# Patient Record
Sex: Female | Born: 1955 | Race: White | Hispanic: No | Marital: Married | Smoking: Never smoker
Health system: Southern US, Community
[De-identification: ages and names within clinical notes are randomized; demographics above are authoritative.]

## PROBLEM LIST (undated history)

## (undated) DIAGNOSIS — R600 Localized edema: Secondary | ICD-10-CM

## (undated) DIAGNOSIS — Q752 Hypertelorism: Secondary | ICD-10-CM

## (undated) DIAGNOSIS — G4730 Sleep apnea, unspecified: Secondary | ICD-10-CM

## (undated) DIAGNOSIS — I10 Essential (primary) hypertension: Secondary | ICD-10-CM

## (undated) DIAGNOSIS — Q2381 Bicuspid aortic valve: Secondary | ICD-10-CM

## (undated) DIAGNOSIS — I350 Nonrheumatic aortic (valve) stenosis: Secondary | ICD-10-CM

## (undated) DIAGNOSIS — I509 Heart failure, unspecified: Secondary | ICD-10-CM

## (undated) DIAGNOSIS — Z954 Presence of other heart-valve replacement: Secondary | ICD-10-CM

## (undated) DIAGNOSIS — Q231 Congenital insufficiency of aortic valve: Secondary | ICD-10-CM

## (undated) HISTORY — DX: Nonrheumatic aortic (valve) stenosis: I35.0

## (undated) HISTORY — DX: Congenital insufficiency of aortic valve: Q23.1

## (undated) HISTORY — DX: Essential (primary) hypertension: I10

## (undated) HISTORY — DX: Morbid (severe) obesity due to excess calories: E66.01

## (undated) HISTORY — DX: Heart failure, unspecified: I50.9

## (undated) HISTORY — DX: Bicuspid aortic valve: Q23.81

## (undated) HISTORY — DX: Presence of other heart-valve replacement: Z95.4

## (undated) HISTORY — DX: Localized edema: R60.0

## (undated) HISTORY — DX: Hypertelorism: Q75.2

## (undated) HISTORY — PX: TISSUE AORTIC VALVE REPLACEMENT: SHX2527

---

## 1981-09-09 HISTORY — PX: CHOLECYSTECTOMY: SHX55

## 1999-02-27 ENCOUNTER — Ambulatory Visit (HOSPITAL_COMMUNITY): Admission: RE | Admit: 1999-02-27 | Discharge: 1999-02-27 | Payer: Self-pay | Admitting: Family Medicine

## 2000-07-10 ENCOUNTER — Encounter: Payer: Self-pay | Admitting: Family Medicine

## 2000-07-10 ENCOUNTER — Encounter: Admission: RE | Admit: 2000-07-10 | Discharge: 2000-07-10 | Payer: Self-pay | Admitting: Family Medicine

## 2006-01-08 ENCOUNTER — Encounter: Payer: Self-pay | Admitting: Family Medicine

## 2013-03-23 DIAGNOSIS — I350 Nonrheumatic aortic (valve) stenosis: Secondary | ICD-10-CM

## 2013-03-23 DIAGNOSIS — I10 Essential (primary) hypertension: Secondary | ICD-10-CM | POA: Insufficient documentation

## 2013-03-23 DIAGNOSIS — I110 Hypertensive heart disease with heart failure: Secondary | ICD-10-CM | POA: Insufficient documentation

## 2013-03-23 HISTORY — DX: Hypertensive heart disease with heart failure: I11.0

## 2013-03-23 HISTORY — DX: Nonrheumatic aortic (valve) stenosis: I35.0

## 2013-03-23 HISTORY — DX: Essential (primary) hypertension: I10

## 2017-02-20 DIAGNOSIS — Q752 Hypertelorism: Secondary | ICD-10-CM | POA: Insufficient documentation

## 2017-02-20 DIAGNOSIS — R600 Localized edema: Secondary | ICD-10-CM

## 2017-02-20 DIAGNOSIS — Z954 Presence of other heart-valve replacement: Secondary | ICD-10-CM

## 2017-02-20 HISTORY — DX: Morbid (severe) obesity due to excess calories: E66.01

## 2017-02-20 HISTORY — DX: Presence of other heart-valve replacement: Z95.4

## 2017-02-20 HISTORY — DX: Hypertelorism: Q75.2

## 2017-02-20 HISTORY — DX: Localized edema: R60.0

## 2017-10-07 ENCOUNTER — Encounter: Payer: Self-pay | Admitting: Cardiology

## 2017-10-07 NOTE — Progress Notes (Signed)
Cardiology Office Note:    Date:  10/08/2017   ID:  [name], DOB August 05, 1956, MRN [id number]  PCP:  [name], MD  Cardiologist:  Shirlee More, MD   Referring MD: No ref. provider found  ASSESSMENT:    1. H/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   2. Hypertensive heart disease with heart failure ([place])    PLAN:    In order of problems listed above:  1. Clinically she is done well since surgery physical exam is not disturbing and I will review the echocardiogram when I can access the report.  If normal valve function we can delay follow-up echocardiogram for several years any questions require yearly surveillance. 2. Her blood pressure is above target she has marked edema right light likely related to calcium channel blocker.  I will switch metoprolol to carvedilol to take advantage of potent antihypertensive effect stop her calcium channel blocker and place on a low-dose of a loop diuretic with follow-up BMP and BNP in 1 week.  I am not convinced that she has heart failure and I suspect much of her edema is due to calcium channel blocker.  Next appointment in 3 weeks to assess response of blood pressure peripheral edema and review echocardiogram with patient   Medication Adjustments/Labs and Tests Ordered: Current medicines are reviewed at length with the patient today.  Concerns regarding medicines are outlined above.  Orders Placed This Encounter  Procedures  . Basic metabolic panel  . Pro b natriuretic peptide (BNP)   Meds ordered this encounter  Medications  . carvedilol (COREG) 6.25 MG tablet    Sig: Take 1 tablet (6.25 mg total) by mouth 2 (two) times daily.    Dispense:  180 tablet    Refill:  3  . furosemide (LASIX) 20 MG tablet    Sig: Take 1 tablet (20 mg total) by mouth daily.    Dispense:  90 tablet    Refill:  3     Chief Complaint  Patient presents with  . New Patient (Initial Visit)    to re-establish cardiac care    History of  Present Illness:    [name] is a 62 y.o. female who is being seen today for the evaluation of valvular heart disease at the request of No ref. provider found. She developed severe symptomatic aortic stenosis with heart failure and underwent 23 mm H[place] II on 03/30/13 Dr Clementeen Graham at [facility].  Her other medical problems included heart failure prior to valve replacement with a  bicuspid aortic valve.  She was seen by [facility] June had an echocardiogram performed results are unavailable  She relates that recently her PCP was concerned about murmur on exam and referred her back to the cardiac group and [place].  An echocardiogram was done and she was told it was fine.  She has done well overall but continues to be short of breath when she climbs stairs walks up hills has peripheral edema taking a calcium channel blocker and no longer takes a diuretic but no orthopnea PND chest pain palpitations syncope fever or chills.  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 Aortic stenosis 03/23/2013   Overview:  02/18/13 TTE EF >55%. Critical AS with mean Ao valve gradient of 82 mm Hg. No AI. No MR, PR, mild TR. Estimated RVSP 30 mm Hg.  . Bicuspid aortic valve   . Edema of both legs  02/20/2017  . 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 Heart failure ([place])   . HTN (hypertension) 03/23/2013  . 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 Morbid obesity ([name]) 02/20/2017    Past Surgical History:  Procedure Laterality Date  . TISSUE AORTIC VALVE REPLACEMENT      Current Medications: Current Meds  Medication Sig  . aspirin EC 81 MG tablet Take 81 mg by mouth daily.  Marland Kitchen buPROPion (WELLBUTRIN XL) 300 MG 24 hr tablet Take 300 mg by mouth daily.  Marland Kitchen desvenlafaxine (PRISTIQ) 100 MG 24 hr tablet Take 100 mg by mouth daily.  Marland Kitchen losartan (COZAAR) 100 MG tablet Take 1 tablet by mouth daily.  . pravastatin (PRAVACHOL) 20 MG tablet Take 20 mg by mouth  daily.  . [DISCONTINUED] amLODipine (NORVASC) 5 MG tablet TAKE 1 TABLET BY MOUTH ONCE DAILY  . [DISCONTINUED] metoprolol succinate (TOPROL-XL) 50 MG 24 hr tablet Take 50 mg by mouth daily.     Allergies:   Patient has no known allergies.   Social History   Socioeconomic History  . Marital status: Married    Spouse name: None  . Number of children: None  . Years of education: None  . Highest education level: None  Social Needs  . Financial resource strain: None  . Food insecurity - worry: None  . Food insecurity - inability: None  . Transportation needs - medical: None  . Transportation needs - non-medical: None  Occupational History  . None  Tobacco Use  . Smoking status: Never Smoker  . Smokeless tobacco: Never Used  Substance and Sexual Activity  . Alcohol use: No    Frequency: Never  . Drug use: No  . Sexual activity: None  Other Topics Concern  . None  Social History Narrative  . None     Family History: The patient's family history includes Parkinson's disease in her father.  ROS:   ROS Please see the history of present illness.     All other systems reviewed and are negative.  EKGs/Labs/Other Studies Reviewed:    The following studies were reviewed today:   EKG:  EKG is  ordered today.  The ekg ordered today demonstrates sinus rhythm LVH repolarization  Recent Labs: No results found for requested labs within last 8760 hours.  Recent Lipid Panel No results found for: CHOL, TRIG, HDL, CHOLHDL, VLDL, LDLCALC, LDLDIRECT  Physical Exam:    VS:  BP (!) 142/86 (BP Location: Right Arm, Patient Position: Sitting, Cuff Size: Large)   Pulse 76   Ht 5\' 4"  (1.626 m)   Wt 274 lb 6.4 oz (124.5 kg)   SpO2 97%   BMI 47.10 kg/m     Wt Readings from Last 3 Encounters:  10/08/17 274 lb 6.4 oz (124.5 kg)     GEN: Marked obesity well  in no acute distress HEENT: Normal NECK: No JVD; No carotid bruits LYMPHATICS: No lymphadenopathy CARDIAC: Expected 1 of 6  localized ejection murmur harsh peaks early S2 normal no AR RRR,  RESPIRATORY:  Clear to auscultation without rales, wheezing or rhonchi  ABDOMEN: Soft, non-tender, non-distended MUSCULOSKELETAL: 4+ bilateral to the knee edema; No deformity  SKIN: Warm and dry NEUROLOGIC:  Alert and oriented x 3 PSYCHIATRIC:  Normal affect     Signed, Shirlee More, MD  10/08/2017 12:27 PM    [facility]

## 2017-10-08 ENCOUNTER — Encounter: Payer: Self-pay | Admitting: Cardiology

## 2017-10-08 ENCOUNTER — Ambulatory Visit: Payer: BC Managed Care – PPO | Admitting: Cardiology

## 2017-10-08 VITALS — BP 142/86 | HR 76 | Ht 64.0 in | Wt 274.4 lb

## 2017-10-08 DIAGNOSIS — I110 Hypertensive heart disease with heart failure: Secondary | ICD-10-CM

## 2017-10-08 DIAGNOSIS — Z954 Presence of other heart-valve replacement: Secondary | ICD-10-CM

## 2017-10-08 MED ORDER — CARVEDILOL 6.25 MG PO TABS: 6.2500 mg | ORAL_TABLET | Freq: Two times a day (BID) | ORAL | 3 refills | Status: DC

## 2017-10-08 MED ORDER — FUROSEMIDE 20 MG PO TABS: 20.0000 mg | ORAL_TABLET | Freq: Every day | ORAL | 3 refills | Status: DC

## 2017-10-08 NOTE — Addendum Note (Signed)
Addended by: Warner Mccreedy E on: 10/08/2017 02:06 PM   Modules accepted: Orders

## 2017-10-08 NOTE — Patient Instructions (Addendum)
Medication Instructions:  Your physician has recommended you make the following change in your medication:  START carevedilol 6.25 mg twice daily START furosemide 20 gm daily  Labwork: Your physician recommends that you have the following labs drawn: BMP and BNP at [facility] in 1 week  Testing/Procedures: None  Follow-Up: Your physician recommends that you schedule a follow-up appointment in: 3 weeks  Any Other Special Instructions Will Be Listed Below (If Applicable).     If you need a refill on your cardiac medications before your next appointment, please call your pharmacy.   [place], RN, BSN   Furosemide tablets What is this medicine? FUROSEMIDE (fyoor [place] se mide) is a diuretic. It helps you make more urine and to lose salt and excess water from your body. This medicine is used to treat high blood pressure, and edema or swelling from heart, kidney, or liver disease. This medicine may be used for other purposes; ask your health care provider or pharmacist if you have questions. COMMON BRAND NAME(S): Active-Medicated Specimen Kit, Delone, Diuscreen, Lasix, RX Specimen Collection Kit, Specimen Collection Kit, URINX Medicated Specimen Collection What should I tell my health care provider before I take this medicine? They need to know if you have any of these conditions: -abnormal blood electrolytes -diarrhea or vomiting -gout -heart disease -kidney disease, small amounts of urine, or difficulty passing urine -liver disease -thyroid disease -an unusual or allergic reaction to furosemide, sulfa drugs, other medicines, foods, dyes, or preservatives -pregnant or trying to get pregnant -breast-feeding How should I use this medicine? Take this medicine by mouth with a glass of water. Follow the directions on the prescription label. You may take this medicine with or without food. If it upsets your stomach, take it with food or milk. Do not take your medicine  more often than directed. Remember that you will need to pass more urine after taking this medicine. Do not take your medicine at a time of day that will cause you problems. Do not take at bedtime. Talk to your pediatrician regarding the use of this medicine in children. While this drug may be prescribed for selected conditions, precautions do apply. Overdosage: If you think you have taken too much of this medicine contact a poison control center or emergency room at once. NOTE: This medicine is only for you. Do not share this medicine with others. What if I miss a dose? If you miss a dose, take it as soon as you can. If it is almost time for your next dose, take only that dose. Do not take double or extra doses. What may interact with this medicine? -aspirin and aspirin-like medicines -certain antibiotics -chloral hydrate -cisplatin -cyclosporine -digoxin -diuretics -laxatives -lithium -medicines for blood pressure -medicines that relax muscles for surgery -methotrexate -NSAIDs, medicines for pain and inflammation like ibuprofen, naproxen, or indomethacin -phenytoin -steroid medicines like prednisone or cortisone -sucralfate -thyroid hormones This list may not describe all possible interactions. Give your health care provider a list of all the medicines, herbs, non-prescription drugs, or dietary supplements you use. Also tell them if you smoke, drink alcohol, or use illegal drugs. Some items may interact with your medicine. What should I watch for while using this medicine? Visit your doctor or health care professional for regular checks on your progress. Check your blood pressure regularly. Ask your doctor or health care professional what your blood pressure should be, and when you should contact him or her. If you are a diabetic,  check your blood sugar as directed. You may need to be on a special diet while taking this medicine. Check with your doctor. Also, ask how many glasses of  fluid you need to drink a day. You must not get dehydrated. You may get drowsy or dizzy. Do not drive, use machinery, or do anything that needs mental alertness until you know how this drug affects you. Do not stand or sit up quickly, especially if you are an older patient. This reduces the risk of dizzy or fainting spells. Alcohol can make you more drowsy and dizzy. Avoid alcoholic drinks. This medicine can make you more sensitive to the sun. Keep out of the sun. If you cannot avoid being in the sun, wear protective clothing and use sunscreen. Do not use sun lamps or tanning beds/booths. What side effects may I notice from receiving this medicine? Side effects that you should report to your doctor or health care professional as soon as possible: -blood in urine or stools -dry mouth -fever or chills -hearing loss or ringing in the ears -irregular heartbeat -muscle pain or weakness, cramps -skin rash -stomach upset, pain, or nausea -tingling or numbness in the hands or feet -unusually weak or tired -vomiting or diarrhea -yellowing of the eyes or skin Side effects that usually do not require medical attention (report to your doctor or health care professional if they continue or are bothersome): -headache -loss of appetite -unusual bleeding or bruising This list may not describe all possible side effects. Call your doctor for medical advice about side effects. You may report side effects to FDA at 1-800-FDA-1088. Where should I keep my medicine? Keep out of the reach of children. Store at room temperature between 15 and 30 degrees C (59 and 86 degrees F). Protect from light. Throw away any unused medicine after the expiration date. NOTE: This sheet is a summary. It may not cover all possible information. If you have questions about this medicine, talk to your doctor, pharmacist, or health care provider.  2018 Elsevier/Gold Standard (2014-11-16 13:49:50) Carvedilol tablets What is this  medicine? CARVEDILOL (KAR ve dil ol) is a beta-blocker. Beta-blockers reduce the workload on the heart and help it to beat more regularly. This medicine is used to treat high blood pressure and heart failure. This medicine may be used for other purposes; ask your health care provider or pharmacist if you have questions. COMMON BRAND NAME(S): Coreg What should I tell my health care provider before I take this medicine? They need to know if you have any of these conditions: -circulation problems -diabetes -history of heart attack or heart disease -liver disease -lung or breathing disease, like asthma or emphysema -pheochromocytoma -slow or irregular heartbeat -thyroid disease -an unusual or allergic reaction to carvedilol, other beta-blockers, medicines, foods, dyes, or preservatives -pregnant or trying to get pregnant -breast-feeding How should I use this medicine? Take this medicine by mouth with a glass of water. Follow the directions on the prescription label. It is best to take the tablets with food. Take your doses at regular intervals. Do not take your medicine more often than directed. Do not stop taking except on the advice of your doctor or health care professional. Talk to your pediatrician regarding the use of this medicine in children. Special care may be needed. Overdosage: If you think you have taken too much of this medicine contact a poison control center or emergency room at once. NOTE: This medicine is only for you. Do not share this medicine  with others. What if I miss a dose? If you miss a dose, take it as soon as you can. If it is almost time for your next dose, take only that dose. Do not take double or extra doses. What may interact with this medicine? This medicine may interact with the following medications: -certain medicines for blood pressure, heart disease, irregular heart beat -certain medicines for depression, like fluoxetine or paroxetine -certain medicines  for diabetes, like glipizide or glyburide -cimetidine -clonidine -cyclosporine -digoxin -MAOIs like Carbex, Eldepryl, Marplan, Nardil, and Parnate -reserpine -rifampin This list may not describe all possible interactions. Give your health care provider a list of all the medicines, herbs, non-prescription drugs, or dietary supplements you use. Also tell them if you smoke, drink alcohol, or use illegal drugs. Some items may interact with your medicine. What should I watch for while using this medicine? Check your heart rate and blood pressure regularly while you are taking this medicine. Ask your doctor or health care professional what your heart rate and blood pressure should be, and when you should contact him or her. Do not stop taking this medicine suddenly. This could lead to serious heart-related effects. Contact your doctor or health care professional if you have difficulty breathing while taking this drug. Check your weight daily. Ask your doctor or health care professional when you should notify him/her of any weight gain. You may get drowsy or dizzy. Do not drive, use machinery, or do anything that requires mental alertness until you know how this medicine affects you. To reduce the risk of dizzy or fainting spells, do not sit or stand up quickly. Alcohol can make you more drowsy, and increase flushing and rapid heartbeats. Avoid alcoholic drinks. If you have diabetes, check your blood sugar as directed. Tell your doctor if you have changes in your blood sugar while you are taking this medicine. If you are going to have surgery, tell your doctor or health care professional that you are taking this medicine. What side effects may I notice from receiving this medicine? Side effects that you should report to your doctor or health care professional as soon as possible: -allergic reactions like skin rash, itching or hives, swelling of the face, lips, or tongue -breathing problems -dark  urine -irregular heartbeat -swollen legs or ankles -vomiting -yellowing of the eyes or skin Side effects that usually do not require medical attention (report to your doctor or health care professional if they continue or are bothersome): -change in sex drive or performance -diarrhea -dry eyes (especially if wearing contact lenses) -dry, itching skin -headache -nausea -unusually tired This list may not describe all possible side effects. Call your doctor for medical advice about side effects. You may report side effects to FDA at 1-800-FDA-1088. Where should I keep my medicine? Keep out of the reach of children. Store at room temperature below 30 degrees C (86 degrees F). Protect from moisture. Keep container tightly closed. Throw away any unused medicine after the expiration date. NOTE: This sheet is a summary. It may not cover all possible information. If you have questions about this medicine, talk to your doctor, pharmacist, or health care provider.  2018 Elsevier/Gold Standard (2013-05-02 14:12:02)   DASH diet: Healthy eating to lower your blood pressure The DASH diet emphasizes portion size, eating a variety of foods and getting the right amount of nutrients. Discover how DASH can improve your health and lower your blood pressure. By [facility] Staff  DASH stands for Dietary Approaches to  Stop Hypertension. The DASH diet is a lifelong approach to healthy eating that's designed to help treat or prevent high blood pressure (hypertension). The DASH diet encourages you to reduce the sodium in your diet and eat a variety of foods rich in nutrients that help lower blood pressure, such as potassium, calcium and magnesium. By following the DASH diet, you may be able to reduce your blood pressure by a few points in just two weeks. Over time, your systolic blood pressure could drop by eight to 14 points, which can make a significant difference in your health risks. Because the DASH diet is a  healthy way of eating, it offers health benefits besides just lowering blood pressure. The DASH diet is also in line with dietary recommendations to prevent osteoporosis, cancer, heart disease, stroke and diabetes. DASH diet: Sodium levels The DASH diet emphasizes vegetables, fruits and low-fat dairy foods - and moderate amounts of whole grains, fish, poultry and nuts. In addition to the standard DASH diet, there is also a lower sodium version of the diet. You can choose the version of the diet that meets your health needs: Standard DASH diet. You can consume up to 2,300 milligrams (mg) of sodium a day.  Lower sodium DASH diet. You can consume up to 1,500 mg of sodium a day. Both versions of the DASH diet aim to reduce the amount of sodium in your diet compared with what you might get in a typical American diet, which can amount to a whopping 3,400 mg of sodium a day or more. The standard DASH diet meets the recommendation from the Dietary Guidelines for Americans to keep daily sodium intake to less than 2,300 mg a day. The American Heart Association recommends 1,500 mg a day of sodium as an upper limit for all adults. If you aren't sure what sodium level is right for you, talk to your doctor. DASH diet: What to eat Both versions of the DASH diet include lots of whole grains, fruits, vegetables and low-fat dairy products. The DASH diet also includes some fish, poultry and legumes, and encourages a small amount of nuts and seeds a few times a week.  You can eat red meat, sweets and fats in small amounts. The DASH diet is low in saturated fat, cholesterol and total fat. Here's a look at the recommended servings from each food group for the 2,000-calorie-a-day DASH diet. Grains: 6 to 8 servings a day Grains include bread, cereal, rice and pasta. Examples of one serving of grains include 1 slice whole-wheat bread, 1 ounce dry cereal, or 1/2 cup cooked cereal, rice or pasta. Focus on whole grains because  they have more fiber and nutrients than do refined grains. For instance, use brown rice instead of white rice, whole-wheat pasta instead of regular pasta and whole-grain bread instead of white bread. Look for products labeled "100 percent whole grain" or "100 percent whole wheat."  Grains are naturally low in fat. Keep them this way by avoiding butter, cream and cheese sauces. Vegetables: 4 to 5 servings a day Tomatoes, carrots, broccoli, sweet potatoes, greens and other vegetables are full of fiber, vitamins, and such minerals as potassium and magnesium. Examples of one serving include 1 cup raw leafy green vegetables or 1/2 cup cut-up raw or cooked vegetables. Don't think of vegetables only as side dishes - a hearty blend of vegetables served over brown rice or whole-wheat noodles can serve as the main dish for a meal.  Fresh and frozen vegetables are both  good choices. When buying frozen and canned vegetables, choose those labeled as low sodium or without added salt.  To increase the number of servings you fit in daily, be creative. In a stir-fry, for instance, cut the amount of meat in half and double up on the vegetables. Fruits: 4 to 5 servings a day Many fruits need little preparation to become a healthy part of a meal or snack. Like vegetables, they're packed with fiber, potassium and magnesium and are typically low in fat - coconuts are an exception. Examples of one serving include one medium fruit, 1/2 cup fresh, frozen or canned fruit, or 4 ounces of juice. Have a piece of fruit with meals and one as a snack, then round out your day with a dessert of fresh fruits topped with a dollop of low-fat yogurt.  Leave on edible peels whenever possible. The peels of apples, pears and most fruits with pits add interesting texture to recipes and contain healthy nutrients and fiber.  Remember that citrus fruits and juices, such as grapefruit, can interact with certain medications, so check with your doctor  or pharmacist to see if they're OK for you.  If you choose canned fruit or juice, make sure no sugar is added. Dairy: 2 to 3 servings a day Milk, yogurt, cheese and other dairy products are major sources of calcium, vitamin D and protein. But the key is to make sure that you choose dairy products that are low fat or fat-free because otherwise they can be a major source of fat - and most of it is saturated. Examples of one serving include 1 cup skim or 1 percent milk, 1 cup low fat yogurt, or 1 1/2 ounces part-skim cheese. Low-fat or fat-free frozen yogurt can help you boost the amount of dairy products you eat while offering a sweet treat. Add fruit for a healthy twist.  If you have trouble digesting dairy products, choose lactose-free products or consider taking an over-the-counter product that contains the enzyme lactase, which can reduce or prevent the symptoms of lactose intolerance.  Go easy on regular and even fat-free cheeses because they are typically high in sodium. Lean meat, poultry and fish: 6 servings or fewer a day Meat can be a rich source of protein, B vitamins, iron and zinc. Choose lean varieties and aim for no more than 6 ounces a day. Cutting back on your meat portion will allow room for more vegetables. Trim away skin and fat from poultry and meat and then bake, broil, grill or roast instead of frying in fat.  Eat heart-healthy fish, such as salmon, herring and tuna. These types of fish are high in omega-3 fatty acids, which can help lower your total cholesterol. Nuts, seeds and legumes: 4 to 5 servings a week Almonds, sunflower seeds, kidney beans, peas, lentils and other foods in this family are good sources of magnesium, potassium and protein. They're also full of fiber and phytochemicals, which are plant compounds that may protect against some cancers and cardiovascular disease. Serving sizes are small and are intended to be consumed only a few times a week because these foods  are high in calories. Examples of one serving include 1/3 cup nuts, 2 tablespoons seeds, or 1/2 cup cooked beans or peas.  Nuts sometimes get a bad rap because of their fat content, but they contain healthy types of fat - monounsaturated fat and omega-3 fatty acids. They're high in calories, however, so eat them in moderation. Try adding them to stir-fries,  salads or cereals.  Soybean-based products, such as tofu and tempeh, can be a good alternative to meat because they contain all of the amino acids your body needs to make a complete protein, just like meat. Fats and oils: 2 to 3 servings a day Fat helps your body absorb essential vitamins and helps your body's immune system. But too much fat increases your risk of heart disease, diabetes and obesity. The DASH diet strives for a healthy balance by limiting total fat to less than 30 percent of daily calories from fat, with a focus on the healthier monounsaturated fats. Examples of one serving include 1 teaspoon soft margarine, 1 tablespoon mayonnaise or 2 tablespoons salad dressing. Saturated fat and trans fat are the main dietary culprits in increasing your risk of coronary artery disease. DASH helps keep your daily saturated fat to less than 6 percent of your total calories by limiting use of meat, butter, cheese, whole milk, cream and eggs in your diet, along with foods made from lard, solid shortenings, and palm and coconut oils.  Avoid trans fat, commonly found in such processed foods as crackers, baked goods and fried items.  Read food labels on margarine and salad dressing so that you can choose those that are lowest in saturated fat and free of trans fat. Sweets: 5 servings or fewer a week You don't have to banish sweets entirely while following the DASH diet - just go easy on them. Examples of one serving include 1 tablespoon sugar, jelly or jam, 1/2 cup sorbet, or 1 cup lemonade. When you eat sweets, choose those that are fat-free or low-fat,  such as sorbets, fruit ices, jelly beans, hard candy, graham crackers or low-fat cookies.  Artificial sweeteners such as aspartame (NutraSweet, Equal) and sucralose (Splenda) may help satisfy your sweet tooth while sparing the sugar. But remember that you still must use them sensibly. It's OK to swap a diet cola for a regular cola, but not in place of a more nutritious beverage such as low-fat milk or even plain water.  Cut back on added sugar, which has no nutritional value but can pack on calories. DASH diet: Alcohol and caffeine Drinking too much alcohol can increase blood pressure. The Dietary Guidelines for Americans recommends that men limit alcohol to no more than two drinks a day and women to one or less. The DASH diet doesn't address caffeine consumption. The influence of caffeine on blood pressure remains unclear. But caffeine can cause your blood pressure to rise at least temporarily. If you already have high blood pressure or if you think caffeine is affecting your blood pressure, talk to your doctor about your caffeine consumption. DASH diet and weight loss While the DASH diet is not a weight-loss program, you may indeed lose unwanted pounds because it can help guide you toward healthier food choices. The DASH diet generally includes about 2,000 calories a day. If you're trying to lose weight, you may need to eat fewer calories. You may also need to adjust your serving goals based on your individual circumstances - something your health care team can help you decide. Tips to cut back on sodium The foods at the core of the DASH diet are naturally low in sodium. So just by following the DASH diet, you're likely to reduce your sodium intake. You also reduce sodium further by: Using sodium-free spices or flavorings with your food instead of salt  Not adding salt when cooking rice, pasta or hot cereal  Rinsing canned foods  to remove some of the sodium  Buying foods labeled "no salt added,"  "sodium-free," "low sodium" or "very low sodium" One teaspoon of table salt has 2,325 mg of sodium. When you read food labels, you may be surprised at just how much sodium some processed foods contain. Even low-fat soups, canned vegetables, ready-to-eat cereals and sliced [place] from the local deli - foods you may have considered healthy - often have lots of sodium. You may notice a difference in taste when you choose low-sodium food and beverages. If things seem too bland, gradually introduce low-sodium foods and cut back on table salt until you reach your sodium goal. That'll give your palate time to adjust. Using salt-free seasoning blends or herbs and spices may also ease the transition. It can take several weeks for your taste buds to get used to less salty foods. Putting the pieces of the DASH diet together Try these strategies to get started on the DASH diet:  Change gradually. If you now eat only one or two servings of fruits or vegetables a day, try to add a serving at lunch and one at dinner. Rather than switching to all whole grains, start by making one or two of your grain servings whole grains. Increasing fruits, vegetables and whole grains gradually can also help prevent bloating or diarrhea that may occur if you aren't used to eating a diet with lots of fiber. You can also try over-the-counter products to help reduce gas from beans and vegetables.  Reward successes and forgive slip-ups. Reward yourself with a nonfood treat for your accomplishments - rent a movie, purchase a book or get together with a friend. Everyone slips, especially when learning something new. Remember that changing your lifestyle is a long-term process. Find out what triggered your setback and then just pick up where you left off with the DASH diet.  Add physical activity. To boost your blood pressure lowering efforts even more, consider increasing your physical activity in addition to following the DASH diet. Combining  both the DASH diet and physical activity makes it more likely that you'll reduce your blood pressure.  Get support if you need it. If you're having trouble sticking to your diet, talk to your doctor or dietitian about it. You might get some tips that will help you stick to the DASH diet. Remember, healthy eating isn't an all-or-nothing proposition. What's most important is that, on average, you eat healthier foods with plenty of variety - both to keep your diet nutritious and to avoid boredom or extremes. And with the DASH diet, you can have both.  Hypertension Hypertension, commonly called high blood pressure, is when the force of blood pumping through the arteries is too strong. The arteries are the blood vessels that carry blood from the heart throughout the body. Hypertension forces the heart to work harder to pump blood and may cause arteries to become narrow or stiff. Having untreated or uncontrolled hypertension can cause heart attacks, strokes, kidney disease, and other problems. A blood pressure reading consists of a higher number over a lower number. Ideally, your blood pressure should be below 120/80. The first ("top") number is called the systolic pressure. It is a measure of the pressure in your arteries as your heart beats. The second ("bottom") number is called the diastolic pressure. It is a measure of the pressure in your arteries as the heart relaxes. What are the causes? The cause of this condition is not known. What increases the risk? Some risk factors  for high blood pressure are under your control. Others are not. Factors you can change  Smoking.  Having type 2 diabetes mellitus, high cholesterol, or both.  Not getting enough exercise or physical activity.  Being overweight.  Having too much fat, sugar, calories, or salt (sodium) in your diet.  Drinking too much alcohol. Factors that are difficult or impossible to change  Having chronic kidney disease.  Having a  family history of high blood pressure.  Age. Risk increases with age.  Race. You may be at higher risk if you are African-American.  Gender. Men are at higher risk than women before age 75. After age 78, women are at higher risk than men.  Having obstructive sleep apnea.  Stress. What are the signs or symptoms? Extremely high blood pressure (hypertensive crisis) may cause:  Headache.  Anxiety.  Shortness of breath.  Nosebleed.  Nausea and vomiting.  Severe chest pain.  Jerky movements you cannot control (seizures).  How is this diagnosed? This condition is diagnosed by measuring your blood pressure while you are seated, with your arm resting on a surface. The cuff of the blood pressure monitor will be placed directly against the skin of your upper arm at the level of your heart. It should be measured at least twice using the same arm. Certain conditions can cause a difference in blood pressure between your right and left arms. Certain factors can cause blood pressure readings to be lower or higher than normal (elevated) for a short period of time:  When your blood pressure is higher when you are in a health care provider's office than when you are at home, this is called white coat hypertension. Most people with this condition do not need medicines.  When your blood pressure is higher at home than when you are in a health care provider's office, this is called masked hypertension. Most people with this condition may need medicines to control blood pressure.  If you have a high blood pressure reading during one visit or you have normal blood pressure with other risk factors:  You may be asked to return on a different day to have your blood pressure checked again.  You may be asked to monitor your blood pressure at home for 1 week or longer.  If you are diagnosed with hypertension, you may have other blood or imaging tests to help your health care provider understand your  overall risk for other conditions. How is this treated? This condition is treated by making healthy lifestyle changes, such as eating healthy foods, exercising more, and reducing your alcohol intake. Your health care provider may prescribe medicine if lifestyle changes are not enough to get your blood pressure under control, and if:  Your systolic blood pressure is above 130.  Your diastolic blood pressure is above 80.  Your personal target blood pressure may vary depending on your medical conditions, your age, and other factors. Follow these instructions at home: Eating and drinking  Eat a diet that is high in fiber and potassium, and low in sodium, added sugar, and fat. An example eating plan is called the DASH (Dietary Approaches to Stop Hypertension) diet. To eat this way: ? Eat plenty of fresh fruits and vegetables. Try to fill half of your plate at each meal with fruits and vegetables. ? Eat whole grains, such as whole wheat pasta, brown rice, or whole grain bread. Fill about one quarter of your plate with whole grains. ? Eat or drink low-fat dairy products,  such as skim milk or low-fat yogurt. ? Avoid fatty cuts of meat, processed or cured meats, and poultry with skin. Fill about one quarter of your plate with lean proteins, such as fish, chicken without skin, beans, eggs, and tofu. ? Avoid premade and processed foods. These tend to be higher in sodium, added sugar, and fat.  Reduce your daily sodium intake. Most people with hypertension should eat less than 1,500 mg of sodium a day.  Limit alcohol intake to no more than 1 drink a day for nonpregnant women and 2 drinks a day for men. One drink equals 12 oz of beer, 5 oz of wine, or 1 oz of hard liquor. Lifestyle  Work with your health care provider to maintain a healthy body weight or to lose weight. Ask what an ideal weight is for you.  Get at least 30 minutes of exercise that causes your heart to beat faster (aerobic exercise)  most days of the week. Activities may include walking, swimming, or biking.  Include exercise to strengthen your muscles (resistance exercise), such as pilates or lifting weights, as part of your weekly exercise routine. Try to do these types of exercises for 30 minutes at least 3 days a week.  Do not use any products that contain nicotine or tobacco, such as cigarettes and e-cigarettes. If you need help quitting, ask your health care provider.  Monitor your blood pressure at home as told by your health care provider.  Keep all follow-up visits as told by your health care provider. This is important. Medicines  Take over-the-counter and prescription medicines only as told by your health care provider. Follow directions carefully. Blood pressure medicines must be taken as prescribed.  Do not skip doses of blood pressure medicine. Doing this puts you at risk for problems and can make the medicine less effective.  Ask your health care provider about side effects or reactions to medicines that you should watch for. Contact a health care provider if:  You think you are having a reaction to a medicine you are taking.  You have headaches that keep coming back (recurring).  You feel dizzy.  You have swelling in your ankles.  You have trouble with your vision. Get help right away if:  You develop a severe headache or confusion.  You have unusual weakness or numbness.  You feel faint.  You have severe pain in your chest or abdomen.  You vomit repeatedly.  You have trouble breathing. Summary  Hypertension is when the force of blood pumping through your arteries is too strong. If this condition is not controlled, it may put you at risk for serious complications.  Your personal target blood pressure may vary depending on your medical conditions, your age, and other factors. For most people, a normal blood pressure is less than 120/80.  Hypertension is treated with lifestyle changes,  medicines, or a combination of both. Lifestyle changes include weight loss, eating a healthy, low-sodium diet, exercising more, and limiting alcohol. This information is not intended to replace advice given to you by your health care provider. Make sure you discuss any questions you have with your health care provider.   Document Released: 08/26/2005 Document Revised: 07/24/2016 Document Reviewed: 07/24/2016 Elsevier Interactive Patient Education  [employer].

## 2017-11-03 NOTE — Progress Notes (Signed)
Cardiology Office Note:    Date:  11/04/2017   ID:  [name], DOB 06/17/1956, MRN [id number]  PCP:  [name], MD  Cardiologist:  Shirlee More, MD    Referring MD: [name], MD    ASSESSMENT:    1. Hypertensive heart disease with heart failure ([place])   2. Edema of both legs   3. H/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    PLAN:    In order of problems listed above:  1. Stable continue loop diuretic.  I am awaiting labs that were drawn as an outpatient #from last few weeks requested from PCP.  Blood pressure remains elevated add in more potent ARB. 2. Improved continue loop diuretic 3. Stable awaiting echocardiogram from June which is in scanning.    Next appointment: 6 months   Medication Adjustments/Labs and Tests Ordered: Current medicines are reviewed at length with the patient today.  Concerns regarding medicines are outlined above.  No orders of the defined types were placed in this encounter.  Meds ordered this encounter  Medications  . telmisartan (MICARDIS) 40 MG tablet    Sig: Take 1 tablet (40 mg total) by mouth daily.    Dispense:  90 tablet    Refill:  3    Chief Complaint  Patient presents with  . Cardiac Valve Problem    History of AVR  . Hypertension    History of Present Illness:    [name] is a 62 y.o. female with a hx of severe symptomatic aortic stenosis with heart failure and underwent 23 mm Hancock II on 03/30/13 Dr Clementeen Graham at [facility].  Her other medical problems included heart failure prior to valve replacement with a  bicuspid aortic valve. She was  last seen one month ago.  ASSESSMENT:    1. H/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   2. Hypertensive heart disease with heart failure (HCC)    PLAN:    In order of problems listed above:           Clinically she is done well since surgery physical exam is not disturbing and I will review the echocardiogram when I can access the report.  If normal  valve function we can delay follow-up echocardiogram for several years any questions require yearly surveillance.            Her blood pressure is above target she has marked edema right light likely related to calcium channel blocker.  I will switch metoprolol to carvedilol to take advantage of potent antihypertensive effect stop her calcium channel blocker and place on a low-dose of a loop diuretic with follow-up BMP and BNP in 1 week.  I am not convinced that she has heart failure and I suspect much of her edema is due to calcium channel blocker.  Compliance with diet, lifestyle and medications: Yes She is improved less edema shortness of breath but blood pressure remains elevated. Past Medical History:  Diagnosis Date  . Aortic stenosis 03/23/2013   Overview:  02/18/13 TTE EF >55%. Critical AS with mean Ao valve gradient of 82 mm Hg. No AI. No MR, PR, mild TR. Estimated RVSP 30 mm Hg.  . Bicuspid aortic valve   . Edema of both legs 02/20/2017  . 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 Heart failure ([place])   . HTN (hypertension) 03/23/2013  . 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 Morbid obesity ([place]) 02/20/2017    Past Surgical  History:  Procedure Laterality Date  . TISSUE AORTIC VALVE REPLACEMENT      Current Medications: Current Meds  Medication Sig  . aspirin EC 81 MG tablet Take 81 mg by mouth daily.  Marland Kitchen buPROPion (WELLBUTRIN XL) 300 MG 24 hr tablet Take 300 mg by mouth daily.  . carvedilol (COREG) 6.25 MG tablet Take 1 tablet (6.25 mg total) by mouth 2 (two) times daily.  Marland Kitchen desvenlafaxine (PRISTIQ) 100 MG 24 hr tablet Take 100 mg by mouth daily.  . furosemide (LASIX) 20 MG tablet Take 1 tablet (20 mg total) by mouth daily.  . pravastatin (PRAVACHOL) 20 MG tablet Take 20 mg by mouth daily.  . [DISCONTINUED] losartan (COZAAR) 100 MG tablet Take 1 tablet by mouth daily.     Allergies:   Patient has no known allergies.   Social History   Socioeconomic History  . Marital  status: Married    Spouse name: None  . Number of children: None  . Years of education: None  . Highest education level: None  Social Needs  . Financial resource strain: None  . Food insecurity - worry: None  . Food insecurity - inability: None  . Transportation needs - medical: None  . Transportation needs - non-medical: None  Occupational History  . None  Tobacco Use  . Smoking status: Never Smoker  . Smokeless tobacco: Never Used  Substance and Sexual Activity  . Alcohol use: No    Frequency: Never  . Drug use: No  . Sexual activity: None  Other Topics Concern  . None  Social History Narrative  . None     Family History: The patient's family history includes Parkinson's disease in her father. ROS:   Please see the history of present illness.    All other systems reviewed and are negative.  EKGs/Labs/Other Studies Reviewed:    The following studies were reviewed today:    Recent Labs: No results found for requested labs within last 8760 hours.  Recent Lipid Panel No results found for: CHOL, TRIG, HDL, CHOLHDL, VLDL, LDLCALC, LDLDIRECT  Physical Exam:    VS:  BP (!) 146/78 (BP Location: Right Arm, Patient Position: Sitting, Cuff Size: Large)   Pulse 84   Ht 5\' 4"  (1.626 m)   Wt 274 lb 4 oz (124.4 kg)   SpO2 97%   BMI 47.07 kg/m     Wt Readings from Last 3 Encounters:  11/04/17 274 lb 4 oz (124.4 kg)  10/08/17 274 lb 6.4 oz (124.5 kg)     GEN:  Central obesity in no acute distress HEENT: Normal NECK: No JVD; No carotid bruits LYMPHATICS: No lymphadenopathy CARDIAC: 1/6 SEM no ARRRR, no murmurs, rubs, gallops RESPIRATORY:  Clear to auscultation without rales, wheezing or rhonchi  ABDOMEN: Soft, non-tender, non-distended MUSCULOSKELETAL:  1-2+ ankle bilateral  edema; No deformity  SKIN: Warm and dry NEUROLOGIC:  Alert and oriented x 3 PSYCHIATRIC:  Normal affect    Signed, Shirlee More, MD  11/04/2017 12:50 PM    [facility]

## 2017-11-04 ENCOUNTER — Ambulatory Visit: Payer: BC Managed Care – PPO | Admitting: Cardiology

## 2017-11-04 ENCOUNTER — Encounter: Payer: Self-pay | Admitting: Cardiology

## 2017-11-04 ENCOUNTER — Telehealth: Payer: Self-pay

## 2017-11-04 VITALS — BP 146/78 | HR 84 | Ht 64.0 in | Wt 274.2 lb

## 2017-11-04 DIAGNOSIS — R600 Localized edema: Secondary | ICD-10-CM | POA: Diagnosis not present

## 2017-11-04 DIAGNOSIS — Z954 Presence of other heart-valve replacement: Secondary | ICD-10-CM

## 2017-11-04 DIAGNOSIS — I110 Hypertensive heart disease with heart failure: Secondary | ICD-10-CM

## 2017-11-04 MED ORDER — TELMISARTAN 40 MG PO TABS: 40.0000 mg | ORAL_TABLET | Freq: Every day | ORAL | 3 refills | Status: DC

## 2017-11-04 NOTE — Telephone Encounter (Signed)
Patient informed of results of lab work received from PCP as stable. Patient verbalized understanding, no further questions.

## 2017-11-04 NOTE — Patient Instructions (Addendum)
Medication Instructions:  Your physician has recommended you make the following change in your medication:  STOP losartan START telmisartan (Micardis) 40 mg daily  Fax BP record take daily to me in 2-3 weeks re result of medication change  Labwork: None  Testing/Procedures: None  Follow-Up: Your physician wants you to follow-up in: 6 months. You will receive a reminder letter in the mail two months in advance. If you don't receive a letter, please call our office to schedule the follow-up appointment.  Any Other Special Instructions Will Be Listed Below (If Applicable).     If you need a refill on your cardiac medications before your next appointment, please call your pharmacy.

## 2017-12-15 ENCOUNTER — Ambulatory Visit (INDEPENDENT_AMBULATORY_CARE_PROVIDER_SITE_OTHER): Payer: BC Managed Care – PPO | Admitting: Cardiology

## 2017-12-15 ENCOUNTER — Telehealth: Payer: Self-pay | Admitting: *Deleted

## 2017-12-15 DIAGNOSIS — R079 Chest pain, unspecified: Secondary | ICD-10-CM | POA: Diagnosis not present

## 2017-12-15 DIAGNOSIS — M79602 Pain in left arm: Secondary | ICD-10-CM | POA: Diagnosis not present

## 2017-12-15 NOTE — Telephone Encounter (Signed)
Pt phoned to say had some shoulder pain over the weekend which moved to center of her chest accompanied with some nausea. Didn't know if she should be seen or not. Please advise

## 2017-12-15 NOTE — Progress Notes (Signed)
Patient arrived for EKG after having chest pain and left arm pain over the weekend. EKG reviewed by Dr. Bettina Gavia. No change since previous EKG in chart. Advised patient to continue all medications and follow-up and return call with any further questions or concerns.

## 2017-12-15 NOTE — Telephone Encounter (Signed)
Friday night the patient had sharp pain in her right shoulder and ear. The pain stayed for a while. The pain moved to the center of her chest. During this time BP 135/77, HR 90. Patient also felt nauseas. Total pain lasted about 4 hours. Patient states she did burp several times. Patient states due to the pain, it was more difficult to breathe. Patient did not go to the ED for evaluation.  No chest pain or any other symptoms Saturday, Sunday, or now. Please advise.

## 2017-12-15 NOTE — Telephone Encounter (Signed)
Best to come to office for an EKG

## 2017-12-15 NOTE — Telephone Encounter (Signed)
Appointment made for EKG today at 4 pm. Patient verbalized understanding of appointment date and time.

## 2018-04-16 DIAGNOSIS — N950 Postmenopausal bleeding: Secondary | ICD-10-CM | POA: Insufficient documentation

## 2018-04-16 HISTORY — DX: Postmenopausal bleeding: N95.0

## 2018-04-21 NOTE — Progress Notes (Signed)
Cardiology Office Note:    Date:  04/22/2018   ID:  [name], DOB October 16, 1955, MRN [id number]  PCP:  [name], MD  Cardiologist:  Shirlee More, MD    Referring MD: [name], MD    ASSESSMENT:    1. Hypertensive heart disease with heart failure ([place])   2. Edema of both legs   3. Dysfunctional uterine bleeding    PLAN:    In order of problems listed above:  1. Worsened despite increasing diuretics she has had a weight gain in excess of 10 to 20 pounds and is developed intense lower extremity edema and exertional shortness of breath.  The differential diagnosis of the precipitant includes diuretic resistance anemia from dysfunctional uterine bleeding or prosthetic valve dysfunction.  I have asked her of evaluation including a CBC BMP proBNP level and echocardiogram to assess prosthetic valve function that was normal year ago.  She will switch from furosemide to torsemide sodium restrict and I will see back in 2 weeks.  If improved I think she can proceed with the planned D&C 2. Intense lower extremity edema associated with weight gain consistent with heart failure.  Although I do not have records she tells me she had a normal duplex of her lower extremities has no evidence of DVT in the last 2 weeks 3. Awaiting intervention D&C I asked her to hold and I will contact Dr. Aline August and reassess at her next visit   Next appointment: 2 weeks   Medication Adjustments/Labs and Tests Ordered: Current medicines are reviewed at length with the patient today.  Concerns regarding medicines are outlined above.  No orders of the defined types were placed in this encounter.  No orders of the defined types were placed in this encounter.   Chief Complaint  Patient presents with  . Follow-up    SP AVR  . Congestive Heart Failure  . Hypertension    History of Present Illness:    [name] is a 62 y.o. female with a hx of severe symptomatic aortic stenosis with  heart failure and underwent 23 mm Hancock II on 03/30/13 Dr Clementeen Graham at [facility] , hypertension and heart failure last seen 11/04/17. Compliance with diet, lifestyle and medications: Yes  She has markedly deteriorated despite increasing diuretics as developed marked lower extremity edema with ulceration of the left leg and exertional shortness of breath.  She is having no orthopnea chest pain palpitation or syncope.  She sodium restricts and is compliant with her diuretic.  She has had no fever or chills.  One year ago she had a prosthetic valve echocardiogram showing normal function Past Medical History:  Diagnosis Date  . Aortic stenosis 03/23/2013   Overview:  02/18/13 TTE EF >55%. Critical AS with mean Ao valve gradient of 82 mm Hg. No AI. No MR, PR, mild TR. Estimated RVSP 30 mm Hg.  . Bicuspid aortic valve   . Edema of both legs 02/20/2017  . 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 Heart failure ([place])   . HTN (hypertension) 03/23/2013  . 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 Morbid obesity ([place]) 02/20/2017    Past Surgical History:  Procedure Laterality Date  . TISSUE AORTIC VALVE REPLACEMENT      Current Medications: Current Meds  Medication Sig  . aspirin EC 81 MG tablet Take 81 mg by mouth daily.  Marland Kitchen buPROPion (WELLBUTRIN XL) 300 MG 24 hr tablet Take 300 mg by mouth daily.  Marland Kitchen  carvedilol (COREG) 6.25 MG tablet Take 1 tablet (6.25 mg total) by mouth 2 (two) times daily.  Marland Kitchen desvenlafaxine (PRISTIQ) 100 MG 24 hr tablet Take 100 mg by mouth daily.  . furosemide (LASIX) 20 MG tablet Take 1 tablet (20 mg total) by mouth daily. (Patient taking differently: Take 60 mg by mouth daily. )  . norethindrone (AYGESTIN) 5 MG tablet Take by mouth daily.  . pravastatin (PRAVACHOL) 20 MG tablet Take 20 mg by mouth daily.  Marland Kitchen telmisartan (MICARDIS) 40 MG tablet Take 1 tablet (40 mg total) by mouth daily.     Allergies:   Patient has no known allergies.   Social History   Socioeconomic History  .  Marital status: Married    Spouse name: Not on file  . Number of children: Not on file  . Years of education: Not on file  . Highest education level: Not on file  Occupational History  . Not on file  Social Needs  . Financial resource strain: Not on file  . Food insecurity:    Worry: Not on file    Inability: Not on file  . Transportation needs:    Medical: Not on file    Non-medical: Not on file  Tobacco Use  . Smoking status: Never Smoker  . Smokeless tobacco: Never Used  Substance and Sexual Activity  . Alcohol use: No    Frequency: Never  . Drug use: No  . Sexual activity: Not on file  Lifestyle  . Physical activity:    Days per week: Not on file    Minutes per session: Not on file  . Stress: Not on file  Relationships  . Social connections:    Talks on phone: Not on file    Gets together: Not on file    Attends religious service: Not on file    Active member of club or organization: Not on file    Attends meetings of clubs or organizations: Not on file    Relationship status: Not on file  Other Topics Concern  . Not on file  Social History Narrative  . Not on file     Family History: The patient's family history includes Parkinson's disease in her father. ROS:   Please see the history of present illness.    All other systems reviewed and are negative.  EKGs/Labs/Other Studies Reviewed:    The following studies were reviewed today:  EKG:  EKG ordered today.  The ekg ordered today demonstrates [place] LVH and repolarization  Recent Labs: No results found for requested labs within last 8760 hours.  Recent Lipid Panel No results found for: CHOL, TRIG, HDL, CHOLHDL, VLDL, LDLCALC, LDLDIRECT  Physical Exam:    VS:  BP 120/66   Pulse (!) 105   Ht 5\' 4"  (1.626 m)   Wt 287 lb 1.9 oz (130.2 kg)   SpO2 96%   BMI 49.28 kg/m     Wt Readings from Last 3 Encounters:  04/22/18 287 lb 1.9 oz (130.2 kg)  11/04/17 274 lb 4 oz (124.4 kg)  10/08/17 274 lb 6.4 oz  (124.5 kg)     GEN:  Well nourished, well developed in no acute distress HEENT: Normal NECK: No JVD; No carotid bruits LYMPHATICS: No lymphadenopathy CARDIAC: RRR, grade 2/6 localized harsh systolic ejection murmur in the aortic area no aortic regurgitation rubs, gallops RESPIRATORY:  Clear to auscultation without rales, wheezing or rhonchi  ABDOMEN: Soft, non-tender, non-distended MUSCULOSKELETAL:  No edema; No deformity  SKIN: Warm  and dry NEUROLOGIC:  Alert and oriented x 3 PSYCHIATRIC:  Normal affect    Signed, Shirlee More, MD  04/22/2018 5:11 PM    [place]

## 2018-04-22 ENCOUNTER — Encounter: Payer: Self-pay | Admitting: Cardiology

## 2018-04-22 ENCOUNTER — Ambulatory Visit (INDEPENDENT_AMBULATORY_CARE_PROVIDER_SITE_OTHER): Payer: BC Managed Care – PPO | Admitting: Cardiology

## 2018-04-22 VITALS — BP 120/66 | HR 105 | Ht 64.0 in | Wt 287.1 lb

## 2018-04-22 DIAGNOSIS — I350 Nonrheumatic aortic (valve) stenosis: Secondary | ICD-10-CM

## 2018-04-22 DIAGNOSIS — N938 Other specified abnormal uterine and vaginal bleeding: Secondary | ICD-10-CM

## 2018-04-22 DIAGNOSIS — I110 Hypertensive heart disease with heart failure: Secondary | ICD-10-CM | POA: Diagnosis not present

## 2018-04-22 DIAGNOSIS — R600 Localized edema: Secondary | ICD-10-CM

## 2018-04-22 HISTORY — DX: Other specified abnormal uterine and vaginal bleeding: N93.8

## 2018-04-22 MED ORDER — TORSEMIDE 20 MG PO TABS: 20.0000 mg | ORAL_TABLET | Freq: Two times a day (BID) | ORAL | 3 refills | Status: DC

## 2018-04-22 NOTE — Patient Instructions (Signed)
Medication Instructions:  Your physician has recommended you make the following change in your medication:   STOP furosemide  START torsemide 20 mg twice daily  Labwork: Your physician recommends that you return for lab work tomorrow at the [place] office: CBC, BMP, ProBNP.  Testing/Procedures: You had an EKG today.   Your physician has requested that you have an echocardiogram. Echocardiography is a painless test that uses sound waves to create images of your heart. It provides your doctor with information about the size and shape of your heart and how well your heart's chambers and valves are working. This procedure takes approximately one hour. There are no restrictions for this procedure.  Follow-Up: Your physician recommends that you schedule a follow-up appointment in: 2 weeks.   If you need a refill on your cardiac medications before your next appointment, please call your pharmacy.   Thank you for choosing [facility]! Robyne Peers, RN [phone]   Echocardiogram An echocardiogram, or echocardiography, uses sound waves (ultrasound) to produce an image of your heart. The echocardiogram is simple, painless, obtained within a short period of time, and offers valuable information to your health care provider. The images from an echocardiogram can provide information such as:  Evidence of coronary artery disease (CAD).  Heart size.  Heart muscle function.  Heart valve function.  Aneurysm detection.  Evidence of a past heart attack.  Fluid buildup around the heart.  Heart muscle thickening.  Assess heart valve function.  Tell a health care provider about:  Any allergies you have.  All medicines you are taking, including vitamins, herbs, eye drops, creams, and over-the-counter medicines.  Any problems you or family members have had with anesthetic medicines.  Any blood disorders you have.  Any surgeries you have had.  Any medical conditions you  have.  Whether you are pregnant or may be pregnant. What happens before the procedure? No special preparation is needed. Eat and drink normally. What happens during the procedure?  In order to produce an image of your heart, gel will be applied to your chest and a wand-like tool (transducer) will be moved over your chest. The gel will help transmit the sound waves from the transducer. The sound waves will harmlessly bounce off your heart to allow the heart images to be captured in real-time motion. These images will then be recorded.  You may need an IV to receive a medicine that improves the quality of the pictures. What happens after the procedure? You may return to your normal schedule including diet, activities, and medicines, unless your health care provider tells you otherwise. This information is not intended to replace advice given to you by your health care provider. Make sure you discuss any questions you have with your health care provider. Document Released: 08/23/2000 Document Revised: 04/13/2016 Document Reviewed: 05/03/2013 Elsevier Interactive Patient Education  2017 [place] tablets What is this medicine? TORSEMIDE (TORE se mide) is a diuretic. It helps you make more urine and to lose salt and excess water from your body. This medicine is used to treat high blood pressure, and edema or swelling from heart, kidney, or liver disease. This medicine may be used for other purposes; ask your health care provider or pharmacist if you have questions. COMMON BRAND NAME(S): Demadex What should I tell my health care provider before I take this medicine? They need to know if you have any of these conditions: -abnormal blood electrolytes -diabetes -gout -heart disease -kidney disease -liver disease -small  amounts of urine, or difficulty passing urine -an unusual or allergic reaction to torsemide, sulfa drugs, other medicines, foods, dyes, or  preservatives -pregnant or trying to get pregnant -breast-feeding How should I use this medicine? Take this medicine by mouth with a glass of water. Follow the directions on the prescription label. You may take this medicine with or without food. If it upsets your stomach, take it with food or milk. Do not take your medicine more often than directed. Remember that you will need to pass more urine after taking this medicine. Do not take your medicine at a time of day that will cause you problems. Do not take at bedtime. Talk to your pediatrician regarding the use of this medicine in children. Special care may be needed. Overdosage: If you think you have taken too much of this medicine contact a poison control center or emergency room at once. NOTE: This medicine is only for you. Do not share this medicine with others. What if I miss a dose? If you miss a dose, take it as soon as you can. If it is almost time for your next dose, take only that dose. Do not take double or extra doses. What may interact with this medicine? -alcohol -certain antibiotics given by injection certain heart medicines like digoxin -diuretics -lithium -medicines for diabetes -medicines for blood pressure -medicines for cholesterol like cholestyramine -medicines that relax muscles for surgery -NSAIDs, medicines for pain and inflammation, like ibuprofen or naproxen -OTC supplements like ginseng and ephedra -probenecid -steroid medicines like prednisone or cortisone This list may not describe all possible interactions. Give your health care provider a list of all the medicines, herbs, non-prescription drugs, or dietary supplements you use. Also tell them if you smoke, drink alcohol, or use illegal drugs. Some items may interact with your medicine. What should I watch for while using this medicine? Visit your doctor or health care professional for regular checks on your progress. Check your blood pressure regularly. Ask  your doctor or health care professional what your blood pressure should be, and when you should contact him or her. If you are a diabetic, check your blood sugar as directed. You may need to be on a special diet while taking this medicine. Check with your doctor. Also, ask how many glasses of fluid you need to drink a day. You must not get dehydrated. You may get drowsy or dizzy. Do not drive, use machinery, or do anything that needs mental alertness until you know how this drug affects you. Do not stand or sit up quickly, especially if you are an older patient. This reduces the risk of dizzy or fainting spells. Alcohol can make you more drowsy and dizzy. Avoid alcoholic drinks. What side effects may I notice from receiving this medicine? Side effects that you should report to your doctor or health care professional as soon as possible: -allergic reactions such as skin rash or itching, hives, swelling of the lips, mouth, tongue or throat -blood in urine or stool -dry mouth -hearing loss or ringing in the ears -irregular heartbeat -muscle pain, weakness or cramps -pain or difficulty passing urine -unusually weak or tired -vomiting or diarrhea Side effects that usually do not require medical attention (report to your doctor or health care professional if they continue or are bothersome): -dizzy or lightheaded -headache -increased thirst -passing large amounts of urine -sexual difficulties -stomach pain, upset or nausea This list may not describe all possible side effects. Call your doctor for medical advice  about side effects. You may report side effects to FDA at 1-800-FDA-1088. Where should I keep my medicine? Keep out of the reach of children. Store at room temperature between 15 and 30 degrees C (59 and 86 degrees F). Throw away any unused medicine after the expiration date. NOTE: This sheet is a summary. It may not cover all possible information. If you have questions about this medicine,  talk to your doctor, pharmacist, or health care provider.  2018 Elsevier/Gold Standard (2008-05-12 11:35:45)   Natriuretic Peptides Test Why am I having this test? These are tests that help to diagnose the presence of heart failure and determine how severe it is. They may be done if you are being treated for heart failure. They may be done if you have symptoms of heart failure including:  Shortness of breath.  Fatigue.  There are three main natriuretic peptides (NPs):  ANP. This is produced by the part of your heart that receives blood from the body (atrium).  BNP. This is produced by your heart's main pumping chamber (left ventricle).  CNP. This is produced by the cells that line your blood vessels (endothelial cells).  In particular, BNP levels may help your health care provider:  To diagnose heart abnormalities and heart failure.  To monitor disease progression and treatment.  What kind of sample is taken? A blood sample is required for this test. It is usually collected by inserting a needle into a vein. How do I prepare for this test? There is no preparation required for this test. What are the reference values? Reference values are considered healthy values established after testing a large group of healthy people. Reference values may vary among different people, labs, and hospitals. It is your responsibility to obtain your test results. Ask the lab or department performing the test when and how you will get your results. Reference values are as follows:  ANP: 22-77 pg/mL or 22-77 ng/L (SI units).  BNP: Less than 100 pg/mL or less than 100 ng/L (SI units).  CNP: These values are yet to be determined.  What do the results mean? Results that are higher than the reference values may indicate:  Heart failure.  Heart attack.  High blood pressure (hypertension).  Heart transplant rejection.  Talk with your health care provider to discuss your results, treatment  options, and if necessary, the need for more tests. Talk with your health care provider if you have any questions about your results. Talk with your health care provider to discuss your results, treatment options, and if necessary, the need for more tests. Talk with your health care provider if you have any questions about your results. This information is not intended to replace advice given to you by your health care provider. Make sure you discuss any questions you have with your health care provider. Document Released: 09/28/2004 Document Revised: 05/01/2016 Document Reviewed: 02/14/2014 Elsevier Interactive Patient Education  2018 [employer].

## 2018-04-24 LAB — BASIC METABOLIC PANEL
BUN / CREAT RATIO: 19 (ref 12–28)
BUN: 17 mg/dL (ref 8–27)
CHLORIDE: 104 mmol/L (ref 96–106)
CO2: 22 mmol/L (ref 20–29)
Calcium: 9.7 mg/dL (ref 8.7–10.3)
Creatinine, Ser: 0.91 mg/dL (ref 0.57–1.00)
GFR calc non Af Amer: 67 mL/min/{1.73_m2} (ref >59)
GFR, EST AFRICAN AMERICAN: 78 mL/min/{1.73_m2} (ref >59)
GLUCOSE: 110 mg/dL — AB (ref 65–99)
Potassium: 4.8 mmol/L (ref 3.5–5.2)
Sodium: 139 mmol/L (ref 134–144)

## 2018-04-24 LAB — CBC
Hematocrit: 33.8 % — ABNORMAL LOW (ref 34.0–46.6)
Hemoglobin: 10.4 g/dL — ABNORMAL LOW (ref 11.1–15.9)
MCH: 25.8 pg — AB (ref 26.6–33.0)
MCHC: 30.8 g/dL — ABNORMAL LOW (ref 31.5–35.7)
MCV: 84 fL (ref 79–97)
PLATELETS: 356 10*3/uL (ref 150–450)
RBC: 4.03 x10E6/uL (ref 3.77–5.28)
RDW: 16.3 % — AB (ref 12.3–15.4)
WBC: 12.2 10*3/uL — ABNORMAL HIGH (ref 3.4–10.8)

## 2018-04-24 LAB — PRO B NATRIURETIC PEPTIDE: NT-PRO BNP: 464 pg/mL — AB (ref 0–287)

## 2018-04-28 ENCOUNTER — Ambulatory Visit (HOSPITAL_BASED_OUTPATIENT_CLINIC_OR_DEPARTMENT_OTHER): Admission: RE | Admit: 2018-04-28 | Payer: BC Managed Care – PPO | Source: Ambulatory Visit

## 2018-05-21 ENCOUNTER — Encounter: Payer: Self-pay | Admitting: Cardiology

## 2018-05-21 ENCOUNTER — Ambulatory Visit (INDEPENDENT_AMBULATORY_CARE_PROVIDER_SITE_OTHER): Payer: BC Managed Care – PPO | Admitting: Cardiology

## 2018-05-21 VITALS — BP 122/80 | HR 81 | Ht 62.0 in | Wt 286.6 lb

## 2018-05-21 DIAGNOSIS — R600 Localized edema: Secondary | ICD-10-CM | POA: Diagnosis not present

## 2018-05-21 DIAGNOSIS — I110 Hypertensive heart disease with heart failure: Secondary | ICD-10-CM | POA: Diagnosis not present

## 2018-05-21 DIAGNOSIS — I350 Nonrheumatic aortic (valve) stenosis: Secondary | ICD-10-CM | POA: Diagnosis not present

## 2018-05-21 MED ORDER — TORSEMIDE 20 MG PO TABS: ORAL_TABLET | ORAL | 3 refills | Status: DC

## 2018-05-21 NOTE — Progress Notes (Signed)
Cardiology Office Note:    Date:  05/21/2018   ID:  [name], DOB 1956-01-16, MRN [id number]  PCP:  [name], MD  Cardiologist:  Shirlee More, MD    Referring MD: [name], MD    ASSESSMENT:    1. Aortic valve stenosis, etiology of cardiac valve disease unspecified   2. Edema of both legs   3. Hypertensive heart disease with heart failure ([place])    PLAN:    In order of problems listed above:  1. Remains symptomatic with heart failure volume overloaded weight is down somewhat in the range of 6 pounds in the last few days we will increase the dose of her diuretic check renal function proBNP and after discussion of the benefits she agrees to undergo echocardiography that she had deferred due to cost.  Her last echocardiogram a year ago showed parameters raising concern for prosthetic valve dysfunction. 2. Concern of valve dysfunction echocardiogram ordered, in particular velocity exceeds 3 m she will require transesophageal echocardiogram 3. Improvement with area of cellulitis now on antibiotics continue   Next appointment: 4 weeks   Medication Adjustments/Labs and Tests Ordered: Current medicines are reviewed at length with the patient today.  Concerns regarding medicines are outlined above.  Orders Placed This Encounter  Procedures  . Basic Metabolic Panel (BMET)  . B Nat Peptide  . ECHOCARDIOGRAM COMPLETE   Meds ordered this encounter  Medications  . torsemide (DEMADEX) 20 MG tablet    Sig: Take 1 tablet (20 mg) by mouth twice daily. Every other day, take 2 tablets (40 mg) in the morning and 1 tablet (20 mg) in the evening.    Dispense:  72 tablet    Refill:  3    Chief Complaint  Patient presents with  . Congestive Heart Failure    History of Present Illness:     [name] is a 62 y.o. female with a hx of  severe symptomatic aortic stenosis with heart failure and underwent 23 mm [place] II on 03/30/13 Dr Clementeen Graham at [facility] , hypertension and  heart failure last seen 04/22/18 with decompensated heart failure. Compliance with diet, lifestyle and medications: Yes  She is hesitant to have an echocardiogram due to cost but after discussion agrees.  She still has edema but it has improved her weight is down somewhere in the range of 6 pounds in the last few days she is not recording her weight at home she still remains short of breath with activities and sleeping in a chair mostly because of pain in the left leg with the open wound.  No chest pain palpitation or syncope. Past Medical History:  Diagnosis Date  . Aortic stenosis 03/23/2013   Overview:  02/18/13 TTE EF >55%. Critical AS with mean Ao valve gradient of 82 mm Hg. No AI. No MR, PR, mild TR. Estimated RVSP 30 mm Hg.  . Bicuspid aortic valve   . Edema of both legs 02/20/2017  . 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 Heart failure ([place])   . HTN (hypertension) 03/23/2013  . 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 Morbid obesity ([place]) 02/20/2017    Past Surgical History:  Procedure Laterality Date  . TISSUE AORTIC VALVE REPLACEMENT      Current Medications: Current Meds  Medication Sig  . amoxicillin (AMOXIL) 500 MG capsule Take 500 mg by mouth daily.  Marland Kitchen aspirin EC 81 MG tablet Take 81 mg by mouth daily.  Marland Kitchen buPROPion (  WELLBUTRIN XL) 300 MG 24 hr tablet Take 300 mg by mouth daily.  . carvedilol (COREG) 6.25 MG tablet Take 1 tablet (6.25 mg total) by mouth 2 (two) times daily.  . cephALEXin (KEFLEX) 500 MG capsule Take 500 mg by mouth 2 (two) times daily.  Marland Kitchen desvenlafaxine (PRISTIQ) 100 MG 24 hr tablet Take 100 mg by mouth daily.  . norethindrone (AYGESTIN) 5 MG tablet Take by mouth daily.  . pravastatin (PRAVACHOL) 20 MG tablet Take 20 mg by mouth daily.  Marland Kitchen telmisartan (MICARDIS) 40 MG tablet Take 1 tablet (40 mg total) by mouth daily.  Marland Kitchen torsemide (DEMADEX) 20 MG tablet Take 1 tablet (20 mg) by mouth twice daily. Every other day, take 2 tablets (40 mg) in the morning  and 1 tablet (20 mg) in the evening.  . [DISCONTINUED] torsemide (DEMADEX) 20 MG tablet Take 1 tablet (20 mg total) by mouth 2 (two) times daily.     Allergies:   Patient has no known allergies.   Social History   Socioeconomic History  . Marital status: Married    Spouse name: Not on file  . Number of children: Not on file  . Years of education: Not on file  . Highest education level: Not on file  Occupational History  . Not on file  Social Needs  . Financial resource strain: Not on file  . Food insecurity:    Worry: Not on file    Inability: Not on file  . Transportation needs:    Medical: Not on file    Non-medical: Not on file  Tobacco Use  . Smoking status: Never Smoker  . Smokeless tobacco: Never Used  Substance and Sexual Activity  . Alcohol use: No    Frequency: Never  . Drug use: No  . Sexual activity: Not on file  Lifestyle  . Physical activity:    Days per week: Not on file    Minutes per session: Not on file  . Stress: Not on file  Relationships  . Social connections:    Talks on phone: Not on file    Gets together: Not on file    Attends religious service: Not on file    Active member of club or organization: Not on file    Attends meetings of clubs or organizations: Not on file    Relationship status: Not on file  Other Topics Concern  . Not on file  Social History Narrative  . Not on file     Family History: The patient's family history includes Heart attack in her mother; Parkinson's disease in her father. ROS:   Please see the history of present illness.    All other systems reviewed and are negative.  EKGs/Labs/Other Studies Reviewed:    The following studies were reviewed today:    Recent Labs: 04/23/2018: BUN 17; Creatinine, Ser 0.91; Hemoglobin 10.4; NT-Pro BNP 464; Platelets 356; Potassium 4.8; Sodium 139  Recent Lipid Panel No results found for: CHOL, TRIG, HDL, CHOLHDL, VLDL, LDLCALC, LDLDIRECT  Physical Exam:    VS:  BP  122/80 (BP Location: Left Arm, Patient Position: Sitting, Cuff Size: Large)   Pulse 81   Ht 5\' 2"  (1.575 m)   Wt 286 lb 9.6 oz (130 kg)   SpO2 97%   BMI 52.42 kg/m     Wt Readings from Last 3 Encounters:  05/21/18 286 lb 9.6 oz (130 kg)  04/22/18 287 lb 1.9 oz (130.2 kg)  11/04/17 274 lb 4 oz (124.4 kg)  GEN: She appears mildly short of breath well nourished, well developed in no acute distress HEENT: Normal NECK: No JVD; No carotid bruits LYMPHATICS: No lymphadenopathy CARDIAC: 2/6 ejection murmur S2 normal no aortic RRR, no murmurs, rubs, gallops RESPIRATORY:  Clear to auscultation without rales, wheezing or rhonchi  ABDOMEN: Soft, non-tender, non-distended MUSCULOSKELETAL: Still 2-3+ bilateral edema to the knees but improved she has an area of cellulitis left leg on antibiotics edema; No deformity  SKIN: Warm and dry NEUROLOGIC:  Alert and oriented x 3 PSYCHIATRIC:  Normal affect    Signed, Shirlee More, MD  05/21/2018 3:54 PM    [facility]

## 2018-05-21 NOTE — Patient Instructions (Addendum)
Medication Instructions:  Your physician has recommended you make the following change in your medication:  INCREASE torsemide (demadex): Take 1 tablet (20 mg) twice daily. Every other day, take 2 tablets (40 mg) in the morning and 1 tablet (20 mg) in the evening.   Labwork: Your physician recommends that you return for lab work today: BMP, ProBNP.   Testing/Procedures: Your physician has requested that you have an echocardiogram. Echocardiography is a painless test that uses sound waves to create images of your heart. It provides your doctor with information about the size and shape of your heart and how well your heart's chambers and valves are working. This procedure takes approximately one hour. There are no restrictions for this procedure.   Follow-Up: Your physician recommends that you schedule a follow-up appointment in: 4 weeks.   If you need a refill on your cardiac medications before your next appointment, please call your pharmacy.   Thank you for choosing [facility]! Robyne Peers, RN [phone]   Echocardiogram An echocardiogram, or echocardiography, uses sound waves (ultrasound) to produce an image of your heart. The echocardiogram is simple, painless, obtained within a short period of time, and offers valuable information to your health care provider. The images from an echocardiogram can provide information such as:  Evidence of coronary artery disease (CAD).  Heart size.  Heart muscle function.  Heart valve function.  Aneurysm detection.  Evidence of a past heart attack.  Fluid buildup around the heart.  Heart muscle thickening.  Assess heart valve function.  Tell a health care provider about:  Any allergies you have.  All medicines you are taking, including vitamins, herbs, eye drops, creams, and over-the-counter medicines.  Any problems you or family members have had with anesthetic medicines.  Any blood disorders you have.  Any  surgeries you have had.  Any medical conditions you have.  Whether you are pregnant or may be pregnant. What happens before the procedure? No special preparation is needed. Eat and drink normally. What happens during the procedure?  In order to produce an image of your heart, gel will be applied to your chest and a wand-like tool (transducer) will be moved over your chest. The gel will help transmit the sound waves from the transducer. The sound waves will harmlessly bounce off your heart to allow the heart images to be captured in real-time motion. These images will then be recorded.  You may need an IV to receive a medicine that improves the quality of the pictures. What happens after the procedure? You may return to your normal schedule including diet, activities, and medicines, unless your health care provider tells you otherwise. This information is not intended to replace advice given to you by your health care provider. Make sure you discuss any questions you have with your health care provider. Document Released: 08/23/2000 Document Revised: 04/13/2016 Document Reviewed: 05/03/2013 Elsevier Interactive Patient Education  2017 [employer].

## 2018-05-27 LAB — BASIC METABOLIC PANEL
BUN / CREAT RATIO: 15 (ref 12–28)
BUN: 17 mg/dL (ref 8–27)
CO2: 26 mmol/L (ref 20–29)
Calcium: 9 mg/dL (ref 8.7–10.3)
Chloride: 98 mmol/L (ref 96–106)
Creatinine, Ser: 1.11 mg/dL — ABNORMAL HIGH (ref 0.57–1.00)
GFR calc Af Amer: 61 mL/min/{1.73_m2} (ref >59)
GFR calc non Af Amer: 53 mL/min/{1.73_m2} — ABNORMAL LOW (ref >59)
GLUCOSE: 142 mg/dL — AB (ref 65–99)
Potassium: 3.5 mmol/L (ref 3.5–5.2)
SODIUM: 141 mmol/L (ref 134–144)

## 2018-05-27 LAB — PRO B NATRIURETIC PEPTIDE: NT-Pro BNP: 380 pg/mL — ABNORMAL HIGH (ref 0–287)

## 2018-06-01 ENCOUNTER — Other Ambulatory Visit: Payer: BC Managed Care – PPO

## 2018-06-11 ENCOUNTER — Encounter

## 2018-06-11 ENCOUNTER — Ambulatory Visit (INDEPENDENT_AMBULATORY_CARE_PROVIDER_SITE_OTHER): Payer: BC Managed Care – PPO

## 2018-06-11 DIAGNOSIS — I350 Nonrheumatic aortic (valve) stenosis: Secondary | ICD-10-CM | POA: Diagnosis not present

## 2018-06-11 NOTE — Progress Notes (Signed)
Complete echocardiogram has been performed.  Jimmy Carlie Corpus, RDCS, RVT 

## 2018-06-25 NOTE — H&P (View-Only) (Signed)
Cardiology Office Note:    Date:  06/26/2018   ID:  [name], DOB 10-28-1955, MRN [id number]  PCP:  Martha Sheriff, MD  Cardiologist:  Martha More, MD    Referring MD: Martha Sheriff, MD    ASSESSMENT:    1. H/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   2. Hypertensive heart disease with heart failure (HCC)   3. Venous ulcer of left leg (HCC)   4. Other iron deficiency anemia    PLAN:    In order of problems listed above:  1. 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  Prior to the test will check labs including a white count and sedimentation rate.  She is on antibiotics and I do not see any value in drawing blood cultures 2. Improved her edema is lessened she will continue her current diuretic 3. Being cared for in [place] clinic she had arterial duplex is done normal ABIs venous duplexes with minimal reflux and no evidence of DVT she is getting local wound care Unna boots antibiotics 4. Improve last hemoglobin greater than 10 recheck CBC prior to elective transesophageal echocardiogram   Next appointment: 2 to 3 weeks   Medication Adjustments/Labs and Tests Ordered: Current medicines are reviewed at length with the patient today.  Concerns regarding medicines are outlined above.  No orders of the defined types were placed in this encounter.  No orders of the defined types were placed in this encounter.   No chief complaint on file.   History of Present Illness:     [name] is a 62 y.o. female with a hx of  severe symptomatic aortic stenosis with heart  failure and underwent 23 mm Hancock II on 03/30/13 Dr Clementeen Graham at [facility] , hypertension and heart failure. 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 She was last seen 05/21/18.  I independently reviewed the echocardiogram there is beat-to-beat variation with therapeutic velocities exceeding 4 m/s and peak velocities last summer when the range of 2.6-2.8. Compliance with diet, lifestyle and medications: Yes  In the interim she has nonhealing ulcer left lower extremity venous being cared for in the wound center she is on antibiotics she says that she has not had a fever but she has had chills she is still has peripheral edema but no shortness of breath chest pain palpitation or syncope.  I reviewed her previous echocardiogram performed at the [facility] office in [place] and her peak velocities were less than 3-year ago this study clearly has peak velocity is greater than 3 and I reviewed the study independently velocities exceed 4 m/s squared.  The valve is not well-visualized although the leaflets do not appear to be thickened.  After review of the findings with patient and her husband I advocated she undergo transesophageal echocardiogram to evaluate for prosthetic valve dysfunction she agrees.  She has no contraindication to TEE Past Medical History:  Diagnosis Date  . Aortic stenosis 03/23/2013   Overview:  02/18/13 TTE EF >55%. Critical AS with mean  Ao valve gradient of 82 mm Hg. No AI. No MR, PR, mild TR. Estimated RVSP 30 mm Hg.  . Bicuspid aortic valve   . Edema of both legs 02/20/2017  . 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 Heart failure ([place])   . HTN (hypertension) 03/23/2013  . 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 Morbid obesity ([place]) 02/20/2017    Past Surgical History:  Procedure Laterality Date  . TISSUE AORTIC VALVE REPLACEMENT      Current Medications: Current Meds  Medication Sig  . amoxicillin (AMOXIL) 500 MG capsule  Take 500 mg by mouth 2 (two) times daily.   Marland Kitchen aspirin EC 81 MG tablet Take 81 mg by mouth daily.  . carvedilol (COREG) 6.25 MG tablet Take 1 tablet (6.25 mg total) by mouth 2 (two) times daily.  . norethindrone (AYGESTIN) 5 MG tablet Take by mouth daily.  . pravastatin (PRAVACHOL) 20 MG tablet Take 20 mg by mouth daily.  Marland Kitchen torsemide (DEMADEX) 20 MG tablet Take 1 tablet (20 mg) by mouth twice daily. Every other day, take 2 tablets (40 mg) in the morning and 1 tablet (20 mg) in the evening.     Allergies:   Patient has no known allergies.   Social History   Socioeconomic History  . Marital status: Married    Spouse name: Not on file  . Number of children: Not on file  . Years of education: Not on file  . Highest education level: Not on file  Occupational History  . Not on file  Social Needs  . Financial resource strain: Not on file  . Food insecurity:    Worry: Not on file    Inability: Not on file  . Transportation needs:    Medical: Not on file    Non-medical: Not on file  Tobacco Use  . Smoking status: Never Smoker  . Smokeless tobacco: Never Used  Substance and Sexual Activity  . Alcohol use: No    Frequency: Never  . Drug use: No  . Sexual activity: Not on file  Lifestyle  . Physical activity:    Days per week: Not on file    Minutes per session: Not on file  . Stress: Not on file  Relationships  . Social connections:    Talks on phone: Not on file    Gets together: Not on file    Attends religious service: Not on file    Active member of club or organization: Not on file    Attends meetings of clubs or organizations: Not on file    Relationship status: Not on file  Other Topics Concern  . Not on file  Social History Narrative  . Not on file     Family History: The patient's family history includes Heart attack in her mother; Parkinson's disease in her father. ROS:   Please see the history of present illness.    All other systems reviewed and are  negative.  EKGs/Labs/Other Studies Reviewed:    The following studies were reviewed today:    Recent Labs: 04/23/2018: Hemoglobin 10.4; Platelets 356 05/26/2018: BUN 17; Creatinine, Ser 1.11; NT-Pro BNP 380; Potassium 3.5; Sodium 141  Recent Lipid Panel No results found for: CHOL, TRIG, HDL, CHOLHDL, VLDL, LDLCALC, LDLDIRECT  Physical Exam:    VS:  BP 136/82 (BP Location: Right Wrist, Patient Position: Sitting, Cuff Size: Normal)   Pulse 88   Ht 5\' 2"  (1.575 m)   Wt 291 lb 6.4 oz (132.2 kg)  SpO2 95%   BMI 53.30 kg/m     Wt Readings from Last 3 Encounters:  06/26/18 291 lb 6.4 oz (132.2 kg)  05/21/18 286 lb 9.6 oz (130 kg)  04/22/18 287 lb 1.9 oz (130.2 kg)     GEN: quite obese  in no acute distress HEENT: Normal NECK: No JVD; No carotid bruits LYMPHATICS: No lymphadenopathy CARDIAC: harsh 3/6 SEM aortic area to right clavicle no AR RRR,  RESPIRATORY:  Clear to auscultation without rales, wheezing or rhonchi  ABDOMEN: Soft, non-tender, non-distended MUSCULOSKELETAL:  No edema; No deformity  SKIN: Warm and dry NEUROLOGIC:  Alert and oriented x 3 PSYCHIATRIC:  Normal affect    Signed, Martha More, MD  06/26/2018 11:06 AM    [place]

## 2018-06-25 NOTE — Progress Notes (Signed)
Cardiology Office Note:    Date:  06/26/2018   ID:  [name], DOB [birth year], MRN [id number]  PCP:  Martha Sheriff, MD  Cardiologist:  Martha More, MD    Referring MD: Martha Sheriff, MD    ASSESSMENT:    1. H/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   2. Hypertensive heart disease with heart failure (HCC)   3. Venous ulcer of left leg (HCC)   4. Other iron deficiency anemia    PLAN:    In order of problems listed above:  1. 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  Prior to the test will check labs including a white count and sedimentation rate.  She is on antibiotics and I do not see any value in drawing blood cultures 2. Improved her edema is lessened she will continue her current diuretic 3. Being cared for in [place] [facility] she had arterial duplex is done normal ABIs venous duplexes with minimal reflux and no evidence of DVT she is getting local wound care Unna boots antibiotics 4. Improve last hemoglobin greater than 10 recheck CBC prior to elective transesophageal echocardiogram   Next appointment: 2 to 3 weeks   Medication Adjustments/Labs and Tests Ordered: Current medicines are reviewed at length with the patient today.  Concerns regarding medicines are outlined above.  No orders of the defined types were placed in this encounter.  No orders of the defined types were placed in this encounter.   No chief complaint on file.   History of Present Illness:     [name] is a 62 y.o. female with a hx of  severe symptomatic aortic stenosis with heart  failure and underwent 23 mm Hancock II on 03/30/13 Dr Clementeen Graham at [facility] , hypertension and heart failure. 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 She was last seen 05/21/18.  I independently reviewed the echocardiogram there is beat-to-beat variation with therapeutic velocities exceeding 4 m/s and peak velocities last summer when the range of 2.6-2.8. Compliance with diet, lifestyle and medications: Yes  In the interim she has nonhealing ulcer left lower extremity venous being cared for in the wound center she is on antibiotics she says that she has not had a fever but she has had chills she is still has peripheral edema but no shortness of breath chest pain palpitation or syncope.  I reviewed her previous echocardiogram performed at the [facility] office in [place] and her peak velocities were less than 3-year ago this study clearly has peak velocity is greater than 3 and I reviewed the study independently velocities exceed 4 m/s squared.  The valve is not well-visualized although the leaflets do not appear to be thickened.  After review of the findings with patient and her husband I advocated she undergo transesophageal echocardiogram to evaluate for prosthetic valve dysfunction she agrees.  She has no contraindication to TEE Past Medical History:  Diagnosis Date  . Aortic stenosis 03/23/2013   Overview:  02/18/13 TTE EF >55%. Critical AS with mean  Ao valve gradient of 82 mm Hg. No AI. No MR, PR, mild TR. Estimated RVSP 30 mm Hg.  . Bicuspid aortic valve   . Edema of both legs 02/20/2017  . 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 Heart failure ([place])   . HTN (hypertension) 03/23/2013  . 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 Morbid obesity ([place]) 02/20/2017    Past Surgical History:  Procedure Laterality Date  . TISSUE AORTIC VALVE REPLACEMENT      Current Medications: Current Meds  Medication Sig  . amoxicillin (AMOXIL) 500 MG capsule  Take 500 mg by mouth 2 (two) times daily.   Marland Kitchen aspirin EC 81 MG tablet Take 81 mg by mouth daily.  . carvedilol (COREG) 6.25 MG tablet Take 1 tablet (6.25 mg total) by mouth 2 (two) times daily.  . norethindrone (AYGESTIN) 5 MG tablet Take by mouth daily.  . pravastatin (PRAVACHOL) 20 MG tablet Take 20 mg by mouth daily.  Marland Kitchen torsemide (DEMADEX) 20 MG tablet Take 1 tablet (20 mg) by mouth twice daily. Every other day, take 2 tablets (40 mg) in the morning and 1 tablet (20 mg) in the evening.     Allergies:   Patient has no known allergies.   Social History   Socioeconomic History  . Marital status: Married    Spouse name: Not on file  . Number of children: Not on file  . Years of education: Not on file  . Highest education level: Not on file  Occupational History  . Not on file  Social Needs  . Financial resource strain: Not on file  . Food insecurity:    Worry: Not on file    Inability: Not on file  . Transportation needs:    Medical: Not on file    Non-medical: Not on file  Tobacco Use  . Smoking status: Never Smoker  . Smokeless tobacco: Never Used  Substance and Sexual Activity  . Alcohol use: No    Frequency: Never  . Drug use: No  . Sexual activity: Not on file  Lifestyle  . Physical activity:    Days per week: Not on file    Minutes per session: Not on file  . Stress: Not on file  Relationships  . Social connections:    Talks on phone: Not on file    Gets together: Not on file    Attends religious service: Not on file    Active member of club or organization: Not on file    Attends meetings of clubs or organizations: Not on file    Relationship status: Not on file  Other Topics Concern  . Not on file  Social History Narrative  . Not on file     Family History: The patient's family history includes Heart attack in her mother; Parkinson's disease in her father. ROS:   Please see the history of present illness.    All other systems reviewed and are  negative.  EKGs/Labs/Other Studies Reviewed:    The following studies were reviewed today:    Recent Labs: 04/23/2018: Hemoglobin 10.4; Platelets 356 05/26/2018: BUN 17; Creatinine, Ser 1.11; NT-Pro BNP 380; Potassium 3.5; Sodium 141  Recent Lipid Panel No results found for: CHOL, TRIG, HDL, CHOLHDL, VLDL, LDLCALC, LDLDIRECT  Physical Exam:    VS:  BP 136/82 (BP Location: Right Wrist, Patient Position: Sitting, Cuff Size: Normal)   Pulse 88   Ht 5\' 2"  (1.575 m)   Wt 291 lb 6.4 oz (132.2 kg)  SpO2 95%   BMI 53.30 kg/m     Wt Readings from Last 3 Encounters:  06/26/18 291 lb 6.4 oz (132.2 kg)  05/21/18 286 lb 9.6 oz (130 kg)  04/22/18 287 lb 1.9 oz (130.2 kg)     GEN: quite obese  in no acute distress HEENT: Normal NECK: No JVD; No carotid bruits LYMPHATICS: No lymphadenopathy CARDIAC: harsh 3/6 SEM aortic area to right clavicle no AR RRR,  RESPIRATORY:  Clear to auscultation without rales, wheezing or rhonchi  ABDOMEN: Soft, non-tender, non-distended MUSCULOSKELETAL:  No edema; No deformity  SKIN: Warm and dry NEUROLOGIC:  Alert and oriented x 3 PSYCHIATRIC:  Normal affect    Signed, Martha More, MD  06/26/2018 11:06 AM    [place]

## 2018-06-26 ENCOUNTER — Telehealth: Payer: Self-pay | Admitting: Cardiology

## 2018-06-26 ENCOUNTER — Encounter: Payer: Self-pay | Admitting: Cardiology

## 2018-06-26 ENCOUNTER — Ambulatory Visit (INDEPENDENT_AMBULATORY_CARE_PROVIDER_SITE_OTHER): Payer: BC Managed Care – PPO | Admitting: Cardiology

## 2018-06-26 VITALS — BP 136/82 | HR 88 | Ht 62.0 in | Wt 291.4 lb

## 2018-06-26 DIAGNOSIS — D508 Other iron deficiency anemias: Secondary | ICD-10-CM

## 2018-06-26 DIAGNOSIS — Z954 Presence of other heart-valve replacement: Secondary | ICD-10-CM | POA: Diagnosis not present

## 2018-06-26 DIAGNOSIS — I83029 Varicose veins of left lower extremity with ulcer of unspecified site: Secondary | ICD-10-CM | POA: Diagnosis not present

## 2018-06-26 DIAGNOSIS — I110 Hypertensive heart disease with heart failure: Secondary | ICD-10-CM

## 2018-06-26 DIAGNOSIS — Z01818 Encounter for other preprocedural examination: Secondary | ICD-10-CM

## 2018-06-26 DIAGNOSIS — L97929 Non-pressure chronic ulcer of unspecified part of left lower leg with unspecified severity: Secondary | ICD-10-CM

## 2018-06-26 NOTE — Patient Instructions (Addendum)
Medication Instructions:  Your physician recommends that you continue on your current medications as directed. Please refer to the Current Medication list given to you today.  If you need a refill on your cardiac medications before your next appointment, please call your pharmacy.   Lab work: Your physician recommends that you return for lab work today: CBC, Sedimentation rate, BMP, PT/INR, ProBNP.   If you have labs (blood work) drawn today and your tests are completely normal, you will receive your results only by: Marland Kitchen MyChart Message (if you have MyChart) OR . A paper copy in the mail If you have any lab test that is abnormal or we need to change your treatment, we will call you to review the results.  Testing/Procedures:  You are scheduled for a TEE on Wednesday, 07/01/18 with Dr. Harrell Gave.  Please arrive at the [facility] (Main Entrance A) at [facility]: [address] [place], [place] [address] at 8:00 am.  DIET: Nothing to eat or drink after midnight except a sip of water with medications (see medication instructions below)  Medication Instructions: Hold torsemide (demadex) the morning of the procedure  Continue your anticoagulant: aspirin You will need to continue your anticoagulant after your procedure until you  are told by your Provider that it is safe to stop   Labs: None needed.  You must have a responsible person to drive you home and stay in the waiting area during your procedure. Failure to do so could result in cancellation.  Bring your insurance cards.  *Special Note: Every effort is made to have your procedure done on time. Occasionally there are emergencies that occur at the hospital that may cause delays. Please be patient if a delay does occur.    Follow-Up: At [facility], you and your health needs are our priority.  As part of our continuing mission to provide you with exceptional heart care, we have created designated Provider Care Teams.   These Care Teams include your primary Cardiologist (physician) and Advanced Practice Providers (APPs -  Physician Assistants and Nurse Practitioners) who all work together to provide you with the care you need, when you need it. . Your physician recommends that you schedule a follow-up appointment in 3 weeks.    **You have been referred to a nutrition/dietary specialist. You will be contacted with more information and to schedule an appointment.    Transesophageal Echocardiogram Transesophageal echocardiography (TEE) is a picture test of your heart using sound waves. The pictures taken can give very detailed pictures of your heart. This can help your doctor see if there are problems with your heart. TEE can check:  If your heart has blood clots in it.  How well your heart valves are working.  If you have an infection on the inside of your heart.  Some of the major arteries of your heart.  If your heart valve is working after a Office manager.  Your heart before a procedure that uses a shock to your heart to get the rhythm back to normal.  What happens before the procedure?  Do not eat or drink for 6 hours before the procedure or as told by your doctor.  Make plans to have someone drive you home after the procedure. Do not drive yourself home.  An IV tube will be put in your arm. What happens during the procedure?  You will be given a medicine to help you relax (sedative). It will be given through the IV tube.  A numbing  medicine will be sprayed or gargled in the back of your throat to help numb it.  The tip of the probe is placed into the back of your mouth. You will be asked to swallow. This helps to pass the probe into your esophagus.  Once the tip of the probe is in the right place, your doctor can take pictures of your heart.  You may feel pressure at the back of your throat. What happens after the procedure?  You will be taken to a recovery area so the sedative can wear  off.  Your throat may be sore and scratchy. This will go away slowly over time.  You will go home when you are fully awake and able to swallow liquids.  You should have someone stay with you for the next 24 hours.  Do not drive or operate machinery for the next 24 hours. This information is not intended to replace advice given to you by your health care provider. Make sure you discuss any questions you have with your health care provider. Document Released: 06/23/2009 Document Revised: 02/01/2016 Document Reviewed: 02/25/2013 Elsevier Interactive Patient Education  [employer].

## 2018-06-27 LAB — BASIC METABOLIC PANEL
BUN / CREAT RATIO: 17 (ref 12–28)
BUN: 18 mg/dL (ref 8–27)
CO2: 27 mmol/L (ref 20–29)
Calcium: 9.4 mg/dL (ref 8.7–10.3)
Chloride: 99 mmol/L (ref 96–106)
Creatinine, Ser: 1.08 mg/dL — ABNORMAL HIGH (ref 0.57–1.00)
GFR calc Af Amer: 63 mL/min/{1.73_m2} (ref >59)
GFR, EST NON AFRICAN AMERICAN: 55 mL/min/{1.73_m2} — AB (ref >59)
Glucose: 125 mg/dL — ABNORMAL HIGH (ref 65–99)
POTASSIUM: 3.9 mmol/L (ref 3.5–5.2)
SODIUM: 141 mmol/L (ref 134–144)

## 2018-06-27 LAB — CBC
HEMATOCRIT: 33.4 % — AB (ref 34.0–46.6)
Hemoglobin: 10 g/dL — ABNORMAL LOW (ref 11.1–15.9)
MCH: 23.7 pg — ABNORMAL LOW (ref 26.6–33.0)
MCHC: 29.9 g/dL — ABNORMAL LOW (ref 31.5–35.7)
MCV: 79 fL (ref 79–97)
Platelets: 380 10*3/uL (ref 150–450)
RBC: 4.22 x10E6/uL (ref 3.77–5.28)
RDW: 17 % — ABNORMAL HIGH (ref 12.3–15.4)
WBC: 15.2 10*3/uL — ABNORMAL HIGH (ref 3.4–10.8)

## 2018-06-27 LAB — SEDIMENTATION RATE: Sed Rate: 89 mm/hr — ABNORMAL HIGH (ref 0–40)

## 2018-06-27 LAB — PROTIME-INR
INR: 1 (ref 0.8–1.2)
Prothrombin Time: 10.1 s (ref 9.1–12.0)

## 2018-06-27 LAB — PRO B NATRIURETIC PEPTIDE: NT-Pro BNP: 698 pg/mL — ABNORMAL HIGH (ref 0–287)

## 2018-06-29 ENCOUNTER — Ambulatory Visit: Payer: BC Managed Care – PPO | Admitting: Cardiology

## 2018-06-30 ENCOUNTER — Telehealth: Payer: Self-pay

## 2018-06-30 NOTE — Telephone Encounter (Signed)
Left voicemail for the patient to call the office regarding her labs.

## 2018-06-30 NOTE — Telephone Encounter (Signed)
-----   Message from Richardo Priest, MD sent at 06/27/2018 11:22 AM EDT ----- Abnormal elevated WBC and sed rate, despite her antibiotic I want blood culture done

## 2018-07-01 ENCOUNTER — Other Ambulatory Visit: Payer: Self-pay

## 2018-07-01 ENCOUNTER — Encounter (HOSPITAL_COMMUNITY): Payer: Self-pay | Admitting: Cardiology

## 2018-07-01 ENCOUNTER — Ambulatory Visit (HOSPITAL_COMMUNITY)
Admission: RE | Admit: 2018-07-01 | Discharge: 2018-07-01 | Disposition: A | Discharge status: Home / Self Care | Payer: BC Managed Care – PPO | Source: Ambulatory Visit | Attending: Cardiology | Admitting: Cardiology

## 2018-07-01 ENCOUNTER — Encounter (HOSPITAL_COMMUNITY)
Admission: RE | Disposition: A | Discharge status: Home / Self Care | Payer: Self-pay | Source: Ambulatory Visit | Attending: Cardiology

## 2018-07-01 ENCOUNTER — Ambulatory Visit (HOSPITAL_BASED_OUTPATIENT_CLINIC_OR_DEPARTMENT_OTHER): Payer: BC Managed Care – PPO

## 2018-07-01 DIAGNOSIS — I110 Hypertensive heart disease with heart failure: Secondary | ICD-10-CM | POA: Insufficient documentation

## 2018-07-01 DIAGNOSIS — D508 Other iron deficiency anemias: Secondary | ICD-10-CM | POA: Insufficient documentation

## 2018-07-01 DIAGNOSIS — Z954 Presence of other heart-valve replacement: Secondary | ICD-10-CM

## 2018-07-01 DIAGNOSIS — I872 Venous insufficiency (chronic) (peripheral): Secondary | ICD-10-CM | POA: Insufficient documentation

## 2018-07-01 DIAGNOSIS — Z7982 Long term (current) use of aspirin: Secondary | ICD-10-CM | POA: Diagnosis not present

## 2018-07-01 DIAGNOSIS — L97929 Non-pressure chronic ulcer of unspecified part of left lower leg with unspecified severity: Secondary | ICD-10-CM | POA: Insufficient documentation

## 2018-07-01 DIAGNOSIS — Z952 Presence of prosthetic heart valve: Secondary | ICD-10-CM | POA: Insufficient documentation

## 2018-07-01 DIAGNOSIS — Z6841 Body Mass Index (BMI) 40.0 and over, adult: Secondary | ICD-10-CM | POA: Insufficient documentation

## 2018-07-01 DIAGNOSIS — I350 Nonrheumatic aortic (valve) stenosis: Secondary | ICD-10-CM | POA: Diagnosis not present

## 2018-07-01 DIAGNOSIS — I340 Nonrheumatic mitral (valve) insufficiency: Secondary | ICD-10-CM | POA: Diagnosis not present

## 2018-07-01 DIAGNOSIS — I509 Heart failure, unspecified: Secondary | ICD-10-CM | POA: Diagnosis not present

## 2018-07-01 DIAGNOSIS — D72829 Elevated white blood cell count, unspecified: Secondary | ICD-10-CM

## 2018-07-01 HISTORY — PX: TEE WITHOUT CARDIOVERSION: SHX5443

## 2018-07-01 SURGERY — ECHOCARDIOGRAM, TRANSESOPHAGEAL
Anesthesia: Moderate Sedation

## 2018-07-01 MED ORDER — MIDAZOLAM HCL 5 MG/ML IJ SOLN
INTRAMUSCULAR | Status: AC
  Filled 2018-07-01: qty 2

## 2018-07-01 MED ORDER — BUTAMBEN-TETRACAINE-BENZOCAINE 2-2-14 % EX AERO
INHALATION_SPRAY | CUTANEOUS | Status: DC | PRN
  Administered 2018-07-01: 2 via TOPICAL

## 2018-07-01 MED ORDER — MIDAZOLAM HCL 10 MG/2ML IJ SOLN
INTRAMUSCULAR | Status: DC | PRN
  Administered 2018-07-01 (×2): 2 mg via INTRAVENOUS

## 2018-07-01 MED ORDER — FENTANYL CITRATE (PF) 100 MCG/2ML IJ SOLN
INTRAMUSCULAR | Status: DC | PRN
  Administered 2018-07-01: 25 ug via INTRAVENOUS

## 2018-07-01 MED ORDER — FENTANYL CITRATE (PF) 100 MCG/2ML IJ SOLN
INTRAMUSCULAR | Status: AC
  Filled 2018-07-01: qty 2

## 2018-07-01 MED ORDER — SODIUM CHLORIDE 0.9 % IV SOLN
INTRAVENOUS | Status: DC
  Administered 2018-07-01: 500 mL via INTRAVENOUS

## 2018-07-01 NOTE — Interval H&P Note (Signed)
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 After consideration of risks, benefits and other options for treatment, the patient has consented to  Procedure(s): TRANSESOPHAGEAL ECHOCARDIOGRAM (TEE) (N/A) as a surgical intervention .  The patient's history has been reviewed, patient examined, no change in status, stable for surgery.  I have reviewed the patient's chart and labs.  Questions were answered to the patient's satisfaction.     Adekunle Rohrbach Harrell Gave

## 2018-07-01 NOTE — CV Procedure (Signed)
Brief TEE report, full report to follow in [place]  Normal LVEF. Bioprostethic aortic valve in good position, no evidence of dehiscence or abcess. Struts appear well aligned. No obvious pannus. Leaflets appear to move well, and color doppler flow through the valve supports minimal stenosis.  There is an increased gradient noted, with the shape consistent with moderate aortic valve stenosis. However, there also appears to be turbulent blood flow through the LVOT and a sigmoid septum. Multiple angles were attempted to try to separate LVOT gradient from aortic valve gradient, but unfortunately due to heart rotation and position, no optimal window was found for this.  The patient did snore throughout the procedure, with O2 sats dropping into the low 90s. Blood pressure was borderline as well. She was nearly awake at the end of the procedure, but given the blood pressure and O2 sats the decision was made to complete the procedure and not given any additional medications.  During this procedure the patient is administered a total of Versed 4 mg and Fentanyl 25 mcg to achieve and maintain moderate conscious sedation.  The patient's heart rate, blood pressure, and oxygen saturation are monitored continuously during the procedure. The period of conscious sedation is 23 minutes, of which I was present face-to-face 100% of this time.  Buford Dresser, MD, PhD [facility]  [address], [place], [place] [address] [phone]

## 2018-07-01 NOTE — Progress Notes (Signed)
  Echocardiogram Echocardiogram Transesophageal has been performed.  [name] 07/01/2018, 1:17 PM

## 2018-07-01 NOTE — Discharge Instructions (Signed)

## 2018-07-02 ENCOUNTER — Encounter (HOSPITAL_COMMUNITY): Payer: Self-pay | Admitting: Cardiology

## 2018-07-02 NOTE — Telephone Encounter (Signed)
Done

## 2018-07-02 NOTE — Telephone Encounter (Signed)
done

## 2018-07-08 ENCOUNTER — Telehealth: Payer: Self-pay | Admitting: Cardiology

## 2018-07-08 ENCOUNTER — Other Ambulatory Visit: Payer: Self-pay | Admitting: *Deleted

## 2018-07-08 ENCOUNTER — Other Ambulatory Visit: Payer: BC Managed Care – PPO

## 2018-07-08 DIAGNOSIS — D72829 Elevated white blood cell count, unspecified: Secondary | ICD-10-CM

## 2018-07-08 NOTE — Telephone Encounter (Signed)
Has questions about where she was told to have her blood drawn

## 2018-07-09 NOTE — Telephone Encounter (Signed)
Returned call yesterday advising her to go to [facility] in [place] to have blood cultures drawn. Patient verbalized understanding. No further questions.

## 2018-07-10 ENCOUNTER — Ambulatory Visit: Payer: BC Managed Care – PPO | Admitting: Cardiology

## 2018-07-10 ENCOUNTER — Encounter: Payer: Self-pay | Admitting: Cardiology

## 2018-07-10 VITALS — BP 124/72 | HR 86 | Ht 62.0 in | Wt 295.5 lb

## 2018-07-10 DIAGNOSIS — Z954 Presence of other heart-valve replacement: Secondary | ICD-10-CM

## 2018-07-10 DIAGNOSIS — R600 Localized edema: Secondary | ICD-10-CM | POA: Diagnosis not present

## 2018-07-10 DIAGNOSIS — R0683 Snoring: Secondary | ICD-10-CM

## 2018-07-10 DIAGNOSIS — I421 Obstructive hypertrophic cardiomyopathy: Secondary | ICD-10-CM

## 2018-07-10 HISTORY — DX: Obstructive hypertrophic cardiomyopathy: I42.1

## 2018-07-10 MED ORDER — METOPROLOL TARTRATE 25 MG PO TABS: 25.0000 mg | ORAL_TABLET | Freq: Three times a day (TID) | ORAL | 3 refills | Status: DC

## 2018-07-10 NOTE — Progress Notes (Signed)
Cardiology Office Note:    Date:  07/10/2018   ID:  [name], DOB Aug 02, 1956, MRN [id number]  PCP:  [name], MD  Cardiologist:  Shirlee More, MD    Referring MD: [name], MD    ASSESSMENT:    1. H/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   2. Obstructive hypertrophic cardiomyopathy (HCC)   3. Edema of both legs   4. Morbid obesity ([place])   5. Snoring    PLAN:    In order of problems listed above:  1. Recent TEE with normal valve function for completeness check blood cultures 2. Is the mechanism of her LV outflow outflow tract gradient and heart failure she will continue loop diuretic switch to metoprolol and will start uptitrate to alleviate outflow tract obstruction and symptoms of heart failure 3. Continue her current diuretic 4. Significant risk factor encourage weight loss 5. Sleep study ordered think the probability of sleep apnea is very high in treatment would help both symptomatically as well as treatment of heart failure.   Next appointment: 4 weeks   Medication Adjustments/Labs and Tests Ordered: Current medicines are reviewed at length with the patient today.  Concerns regarding medicines are outlined above.  Orders Placed This Encounter  Procedures  . Culture, blood (single)  . Culture, blood (single)  . Rheumatoid Factor  . Sed Rate (ESR)  . CBC  . Split night study   Meds ordered this encounter  Medications  . metoprolol tartrate (LOPRESSOR) 25 MG tablet    Sig: Take 1 tablet (25 mg total) by mouth 3 (three) times daily.    Dispense:  90 tablet    Refill:  3    Chief Complaint  Patient presents with  . Follow-up    after TEE    History of Present Illness:    [name] is a 62 y.o. female with a hx of  severe symptomatic aortic stenosis with heart failure and underwent 23 mm [place] SAVR on 03/30/13 Dr Clementeen Graham at [facility] , hypertension and heart failure. 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 She was last seen by me 05/21/18 and referred for a TEE showing:    Normal function of bioprosthetic aortic valve.   Leaflets not fully visualized but appear to open well. Color   doppler shows open flow into valve orifice.     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 Due to   sedation, provocation maneuvers such as valsalva were unable to   be performed.     TEE suggests normal aortic valve function with LVOT gradient.  Compliance with diet, lifestyle and medications: 4 weeks  She continues to follow with the wound care center she has been off antibiotics 2 weeks she has a prescription for Cipro and I asked her to hold that until we check blood cultures that she never had performed.  In some she feels durably she continues to have GU bleeding pending D&C she has a wound on her leg and is having fever and chills and she has edema shortness of breath and she is sure she has sleep apnea because she snores severely she has daytime fatigue and her husband says she stops breathing during the night.  She has had no chest pain palpitation or syncope.  Her transesophageal echocardiogram showed no evidence of  endocarditis she had subaortic stenosis.  We will stop carvedilol which has an alpha blocking activity that can worsen her obstructive hypertrophic cardiomyopathy switch her to Lopressor call my office beginning next week with heart rates and blood pressures to uptitrate and recheck labs including CBC sed rate and rheumatoid factor and a set of blood cultures before she resumes antibiotics I will see her back in the office in 4 weeks.  She is interacting with her GYN doctor with progesterone to try to control her bleeding. Past Medical History:  Diagnosis Date  . Aortic stenosis 03/23/2013   Overview:  02/18/13 TTE EF >55%. Critical AS with mean Ao valve gradient of 82 mm Hg. No AI. No MR, PR,  mild TR. Estimated RVSP 30 mm Hg.  . Bicuspid aortic valve   . Edema of both legs 02/20/2017  . 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 Heart failure ([place])   . HTN (hypertension) 03/23/2013  . 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 Morbid obesity ([place]) 02/20/2017    Past Surgical History:  Procedure Laterality Date  . TEE WITHOUT CARDIOVERSION N/A 07/01/2018   Procedure: TRANSESOPHAGEAL ECHOCARDIOGRAM (TEE);  Surgeon: Buford Dresser, MD;  Location: [facility] ENDOSCOPY;  Service: Cardiovascular;  Laterality: N/A;  . TISSUE AORTIC VALVE REPLACEMENT      Current Medications: Current Meds  Medication Sig  . acetaminophen-codeine (TYLENOL #3) 300-30 MG tablet Take 1-2 tablets by mouth daily as needed.   Marland Kitchen aspirin EC 81 MG tablet Take 81 mg by mouth daily.  . ciprofloxacin (CIPRO) 500 MG tablet Take 500 mg by mouth 2 (two) times daily.  Marland Kitchen ibuprofen (ADVIL,MOTRIN) 200 MG tablet Take 400 mg by mouth 2 (two) times daily as needed for headache or moderate pain.  Marland Kitchen norethindrone (AYGESTIN) 5 MG tablet Take 5 mg by mouth 2 (two) times daily.   . pravastatin (PRAVACHOL) 20 MG tablet Take 20 mg by mouth at bedtime.   . torsemide (DEMADEX) 20 MG tablet Take 1 tablet (20 mg) by mouth twice daily. Every other day, take 2 tablets (40 mg) in the morning and 1 tablet (20 mg) in the evening. (Patient taking differently: Take 20-40 mg by mouth See admin instructions. Alternate taking 20 mg twice daily on day one and 40 mg in the morning and 20 mg in the evening on day two)  . [DISCONTINUED] carvedilol (COREG) 6.25 MG tablet Take 1 tablet (6.25 mg total) by mouth 2 (two) times daily.     Allergies:   Patient has no known allergies.   Social History   Socioeconomic History  . Marital status: Married    Spouse name: Not on file  . Number of children: Not on file  . Years of education: Not on file  . Highest education level: Not on file  Occupational History  . Not on file  Social Needs  .  Financial resource strain: Not on file  . Food insecurity:    Worry: Not on file    Inability: Not on file  . Transportation needs:    Medical: Not on file    Non-medical: Not on file  Tobacco Use  . Smoking status: Never Smoker  . Smokeless tobacco: Never Used  Substance and Sexual Activity  . Alcohol use: No    Frequency: Never  . Drug use: No  . Sexual activity: Not on file  Lifestyle  . Physical activity:    Days per week: Not on file    Minutes per session: Not on  file  . Stress: Not on file  Relationships  . Social connections:    Talks on phone: Not on file    Gets together: Not on file    Attends religious service: Not on file    Active member of club or organization: Not on file    Attends meetings of clubs or organizations: Not on file    Relationship status: Not on file  Other Topics Concern  . Not on file  Social History Narrative  . Not on file     Family History: The patient's family history includes Heart attack in her mother; Parkinson's disease in her father. ROS:   Please see the history of present illness.    All other systems reviewed and are negative.  EKGs/Labs/Other Studies Reviewed:    The following studies were reviewed today:    Recent Labs: 06/26/2018: BUN 18; Creatinine, Ser 1.08; Hemoglobin 10.0; NT-Pro BNP 698; Platelets 380; Potassium 3.9; Sodium 141  Recent Lipid Panel No results found for: CHOL, TRIG, HDL, CHOLHDL, VLDL, LDLCALC, LDLDIRECT  Physical Exam:    VS:  BP 124/72 (BP Location: Right Wrist, Patient Position: Sitting, Cuff Size: Large)   Pulse 86   Ht 5' 2"  (1.575 m)   Wt 295 lb 8 oz (134 kg)   SpO2 91%   BMI 54.05 kg/m     Wt Readings from Last 3 Encounters:  07/10/18 295 lb 8 oz (134 kg)  06/26/18 291 lb 6.4 oz (132.2 kg)  05/21/18 286 lb 9.6 oz (130 kg)     GEN: Marked obesity she looks fatigued she has pallor of the skin well nourished, well developed in no acute distress HEENT: Normal NECK: No JVD; No  carotid bruits LYMPHATICS: No lymphadenopathy CARDIAC: Harsh grade 3/6 holosystolic murmur aortic area up to the right carotid no AR RRR, no murmurs, rubs, gallops RESPIRATORY:  Clear to auscultation without rales, wheezing or rhonchi  ABDOMEN: Soft, non-tender, non-distended MUSCULOSKELETAL: 2-3 edema; No deformity  SKIN: Warm and dry NEUROLOGIC:  Alert and oriented x 3 PSYCHIATRIC:  Normal affect    Signed, Shirlee More, MD  07/10/2018 5:21 PM    [facility]

## 2018-07-10 NOTE — Patient Instructions (Signed)
Medication Instructions:  Your physician has recommended you make the following change in your medication:   STOP carvedilol (coreg)   START metoprolol tartrate (lopressor) 25 mg: Take 1 tablet three times daily  If you need a refill on your cardiac medications before your next appointment, please call your pharmacy.   Lab work: Your physician recommends that you return for lab work today: blood cultures x2, rheumatoid factor, CBC, sedimentation rate.   If you have labs (blood work) drawn today and your tests are completely normal, you will receive your results only by: Marland Kitchen MyChart Message (if you have MyChart) OR . A paper copy in the mail If you have any lab test that is abnormal or we need to change your treatment, we will call you to review the results.  Testing/Procedures: You have been referred to have a sleep study. You will be contacted to schedule this appointment.   Follow-Up: At [facility], you and your health needs are our priority.  As part of our continuing mission to provide you with exceptional heart care, we have created designated Provider Care Teams.  These Care Teams include your primary Cardiologist (physician) and Advanced Practice Providers (APPs -  Physician Assistants and Nurse Practitioners) who all work together to provide you with the care you need, when you need it. You will need a follow up appointment in 4 weeks.      Metoprolol tablets What is this medicine? METOPROLOL (me TOE proe lole) is a beta-blocker. Beta-blockers reduce the workload on the heart and help it to beat more regularly. This medicine is used to treat high blood pressure and to prevent chest pain. It is also used to after a heart attack and to prevent an additional heart attack from occurring. This medicine may be used for other purposes; ask your health care provider or pharmacist if you have questions. COMMON BRAND NAME(S): Lopressor What should I tell my health care provider before  I take this medicine? They need to know if you have any of these conditions: -diabetes -heart or vessel disease like slow heart rate, worsening heart failure, heart block, sick sinus syndrome or Raynaud's disease -kidney disease -liver disease -lung or breathing disease, like asthma or emphysema -pheochromocytoma -thyroid disease -an unusual or allergic reaction to metoprolol, other beta-blockers, medicines, foods, dyes, or preservatives -pregnant or trying to get pregnant -breast-feeding How should I use this medicine? Take this medicine by mouth with a drink of water. Follow the directions on the prescription label. Take this medicine immediately after meals. Take your doses at regular intervals. Do not take more medicine than directed. Do not stop taking this medicine suddenly. This could lead to serious heart-related effects. Talk to your pediatrician regarding the use of this medicine in children. Special care may be needed. Overdosage: If you think you have taken too much of this medicine contact a poison control center or emergency room at once. NOTE: This medicine is only for you. Do not share this medicine with others. What if I miss a dose? If you miss a dose, take it as soon as you can. If it is almost time for your next dose, take only that dose. Do not take double or extra doses. What may interact with this medicine? This medicine may interact with the following medications: -certain medicines for blood pressure, heart disease, irregular heart beat -certain medicines for depression like monoamine oxidase (MAO) inhibitors, fluoxetine, or paroxetine -clonidine -dobutamine -epinephrine -isoproterenol -reserpine This list may not describe all  possible interactions. Give your health care provider a list of all the medicines, herbs, non-prescription drugs, or dietary supplements you use. Also tell them if you smoke, drink alcohol, or use illegal drugs. Some items may interact with  your medicine. What should I watch for while using this medicine? Visit your doctor or health care professional for regular check ups. Contact your doctor right away if your symptoms worsen. Check your blood pressure and pulse rate regularly. Ask your health care professional what your blood pressure and pulse rate should be, and when you should contact them. You may get drowsy or dizzy. Do not drive, use machinery, or do anything that needs mental alertness until you know how this medicine affects you. Do not sit or stand up quickly, especially if you are an older patient. This reduces the risk of dizzy or fainting spells. Contact your doctor if these symptoms continue. Alcohol may interfere with the effect of this medicine. Avoid alcoholic drinks. What side effects may I notice from receiving this medicine? Side effects that you should report to your doctor or health care professional as soon as possible: -allergic reactions like skin rash, itching or hives -cold or numb hands or feet -depression -difficulty breathing -faint -fever with sore throat -irregular heartbeat, chest pain -rapid weight gain -swollen legs or ankles Side effects that usually do not require medical attention (report to your doctor or health care professional if they continue or are bothersome): -anxiety or nervousness -change in sex drive or performance -dry skin -headache -nightmares or trouble sleeping -short term memory loss -stomach upset or diarrhea -unusually tired This list may not describe all possible side effects. Call your doctor for medical advice about side effects. You may report side effects to FDA at 1-800-FDA-1088. Where should I keep my medicine? Keep out of the reach of children. Store at room temperature between 15 and 30 degrees C (59 and 86 degrees F). Throw away any unused medicine after the expiration date. NOTE: This sheet is a summary. It may not cover all possible information. If you  have questions about this medicine, talk to your doctor, pharmacist, or health care provider.  2018 Elsevier/Gold Standard (2013-04-30 14:40:36)

## 2018-07-11 LAB — CBC
HEMATOCRIT: 30.1 % — AB (ref 34.0–46.6)
HEMOGLOBIN: 9.1 g/dL — AB (ref 11.1–15.9)
MCH: 23 pg — AB (ref 26.6–33.0)
MCHC: 30.2 g/dL — ABNORMAL LOW (ref 31.5–35.7)
MCV: 76 fL — AB (ref 79–97)
Platelets: 386 10*3/uL (ref 150–450)
RBC: 3.95 x10E6/uL (ref 3.77–5.28)
RDW: 16.7 % — ABNORMAL HIGH (ref 12.3–15.4)
WBC: 14.3 10*3/uL — ABNORMAL HIGH (ref 3.4–10.8)

## 2018-07-11 LAB — RHEUMATOID FACTOR: Rhuematoid fact SerPl-aCnc: <10 IU/mL (ref 0.0–13.9)

## 2018-07-11 LAB — SEDIMENTATION RATE: Sed Rate: 97 mm/hr — ABNORMAL HIGH (ref 0–40)

## 2018-07-13 ENCOUNTER — Ambulatory Visit: Payer: BC Managed Care – PPO | Admitting: Cardiology

## 2018-07-16 LAB — CULTURE, BLOOD (SINGLE)

## 2018-07-21 ENCOUNTER — Ambulatory Visit: Payer: BC Managed Care – PPO | Admitting: Cardiology

## 2018-08-14 ENCOUNTER — Encounter: Payer: Self-pay | Admitting: Cardiology

## 2018-08-14 ENCOUNTER — Ambulatory Visit (INDEPENDENT_AMBULATORY_CARE_PROVIDER_SITE_OTHER): Payer: BC Managed Care – PPO | Admitting: Cardiology

## 2018-08-14 VITALS — BP 142/82 | HR 88 | Ht 62.0 in | Wt 296.0 lb

## 2018-08-14 DIAGNOSIS — I421 Obstructive hypertrophic cardiomyopathy: Secondary | ICD-10-CM

## 2018-08-14 DIAGNOSIS — Z9189 Other specified personal risk factors, not elsewhere classified: Secondary | ICD-10-CM | POA: Insufficient documentation

## 2018-08-14 DIAGNOSIS — M7989 Other specified soft tissue disorders: Secondary | ICD-10-CM

## 2018-08-14 DIAGNOSIS — I110 Hypertensive heart disease with heart failure: Secondary | ICD-10-CM | POA: Diagnosis not present

## 2018-08-14 DIAGNOSIS — Z954 Presence of other heart-valve replacement: Secondary | ICD-10-CM | POA: Diagnosis not present

## 2018-08-14 LAB — BASIC METABOLIC PANEL
BUN/Creatinine Ratio: 15 (ref 12–28)
BUN: 23 mg/dL (ref 8–27)
CO2: 27 mmol/L (ref 20–29)
Calcium: 10.2 mg/dL (ref 8.7–10.3)
Chloride: 95 mmol/L — ABNORMAL LOW (ref 96–106)
Creatinine, Ser: 1.51 mg/dL — ABNORMAL HIGH (ref 0.57–1.00)
GFR calc Af Amer: 42 mL/min/{1.73_m2} — ABNORMAL LOW (ref >59)
GFR calc non Af Amer: 37 mL/min/{1.73_m2} — ABNORMAL LOW (ref >59)
Glucose: 77 mg/dL (ref 65–99)
Potassium: 4.1 mmol/L (ref 3.5–5.2)
Sodium: 139 mmol/L (ref 134–144)

## 2018-08-14 LAB — PRO B NATRIURETIC PEPTIDE: NT-Pro BNP: 876 pg/mL — ABNORMAL HIGH (ref 0–287)

## 2018-08-14 MED ORDER — METOPROLOL TARTRATE 50 MG PO TABS: 50.0000 mg | ORAL_TABLET | Freq: Two times a day (BID) | ORAL | 3 refills | Status: DC

## 2018-08-14 NOTE — Patient Instructions (Signed)
Medication Instructions:  Your physician has recommended you make the following change in your medication:   INCREASE metoprolol tartrate (lopressor) 50 mg: Take 1 tablet twice daily  If you need a refill on your cardiac medications before your next appointment, please call your pharmacy.   Lab work: Your physician recommends that you return for lab work today: BMP, ProBNP.   If you have labs (blood work) drawn today and your tests are completely normal, you will receive your results only by: Marland Kitchen MyChart Message (if you have MyChart) OR . A paper copy in the mail If you have any lab test that is abnormal or we need to change your treatment, we will call you to review the results.  Testing/Procedures: You have been referred to have a sleep study. You will be contacted to schedule this appointment.   Follow-Up: At [facility], you and your health needs are our priority.  As part of our continuing mission to provide you with exceptional heart care, we have created designated Provider Care Teams.  These Care Teams include your primary Cardiologist (physician) and Advanced Practice Providers (APPs -  Physician Assistants and Nurse Practitioners) who all work together to provide you with the care you need, when you need it. You will need a follow up appointment in 1 months.

## 2018-08-14 NOTE — Progress Notes (Signed)
Cardiology Office Note:    Date:  08/14/2018   ID:  [name], DOB 07-02-1956, MRN [id number]  PCP:  [name], MD  Cardiologist:  Shirlee More, MD    Referring MD: [name], MD    ASSESSMENT:    1. Obstructive hypertrophic cardiomyopathy ([place])   2. Hypertensive heart disease with heart failure ([place])   3. H/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   4. At risk for obstructive sleep apnea   5. Leg swelling    PLAN:    In order of problems listed above:  1. She is subjectively and objectively improved marked decrease in the intensity and severity of her outflow tract murmur she will increase her beta-blocker with a goal heart rate less than 70.  After next visit we will plan repeat echocardiogram for gradient 2. Improved blood pressure at target heart failure seems compensated continue her current dose of loop diuretic recheck renal function potassium and proBNP level with ongoing edema 3. Stable no evidence of endocarditis with sterile blood cultures and transesophageal echo without vegetation 4. Very high risk of sleep apnea she request sleep study ordered if she has obstructive sleep apnea would benefit from CPAP as it may contribute to her volume overload 5. Disproportionate I suspect there is an element of chronic venous and lymphedema as well as obstructive sleep apnea she will continue her current dose of loop diuretic   Next appointment: 1 month   Medication Adjustments/Labs and Tests Ordered: Current medicines are reviewed at length with the patient today.  Concerns regarding medicines are outlined above.  Orders Placed This Encounter  Procedures  . Basic Metabolic Panel (BMET)  . Pro b natriuretic peptide (BNP)  . Split night study   Meds ordered this encounter  Medications  . metoprolol tartrate (LOPRESSOR) 50 MG tablet    Sig: Take 1 tablet (50 mg total) by mouth 2 (two) times daily.    Dispense:  60 tablet    Refill:  3    Chief  Complaint  Patient presents with  . Follow-up    HCOM tissue AVR  . Congestive Heart Failure    History of Present Illness:    [name] is a 62 y.o. female with a hx of severe symptomatic aortic stenosis with heart failure and underwent 23 mm Hancock SAVR on 03/30/13 Dr Clementeen Graham at [facility] , hypertension and heart failure. 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 She was last seen by me 05/21/18 and referred for a TEE showing:    Normal function of bioprosthetic aortic valve.   Leaflets not fully visualized but appear to open well. Color   doppler shows open flow into valve orifice.   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 Due to   sedation, provocation maneuvers such as valsalva were unable to   be performed.   TEE suggests normal aortic valve function with LVOT gradient. In response she has initiated on beta-blocker and with ongoing problems of wound infection antibiotic therapy she had repeat blood cultures performed that remain sterile.  She does have persistent elevated white count most recent 14,300 sedimentation rate is markedly elevated most recently 3 previously 89 and rheumatoid factor is normal  Compliance with diet, lifestyle and medications: Yes  She is improved less edema short of breath when she walks  outdoors she sleeps in a chair and I think it has more to do with obstructive sleep apnea than heart failure and is a habit.  Her leg wounds are almost cleared she request a sleep study and that is been ordered.  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  No chest pain syncope or palpitation she had no fever or chills and she is on oral antibiotics amoxicillin. Past Medical History:  Diagnosis Date  . Aortic stenosis 03/23/2013   Overview:  02/18/13 TTE  EF >55%. Critical AS with mean Ao valve gradient of 82 mm Hg. No AI. No MR, PR, mild TR. Estimated RVSP 30 mm Hg.  . Bicuspid aortic valve   . Edema of both legs 02/20/2017  . 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 Heart failure ([place])   . HTN (hypertension) 03/23/2013  . 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 Morbid obesity ([place]) 02/20/2017    Past Surgical History:  Procedure Laterality Date  . TEE WITHOUT CARDIOVERSION N/A 07/01/2018   Procedure: TRANSESOPHAGEAL ECHOCARDIOGRAM (TEE);  Surgeon: Buford Dresser, MD;  Location: [facility] ENDOSCOPY;  Service: Cardiovascular;  Laterality: N/A;  . TISSUE AORTIC VALVE REPLACEMENT      Current Medications: Current Meds  Medication Sig  . acetaminophen-codeine (TYLENOL #3) 300-30 MG tablet Take 1-2 tablets by mouth daily as needed.   Marland Kitchen amoxicillin (AMOXIL) 500 MG capsule Take 500 mg by mouth 2 (two) times daily.  Marland Kitchen aspirin EC 81 MG tablet Take 81 mg by mouth daily.  Marland Kitchen ibuprofen (ADVIL,MOTRIN) 200 MG tablet Take 400 mg by mouth 2 (two) times daily as needed for headache or moderate pain.  . metoprolol tartrate (LOPRESSOR) 50 MG tablet Take 1 tablet (50 mg total) by mouth 2 (two) times daily.  . norethindrone (AYGESTIN) 5 MG tablet Take 5 mg by mouth 2 (two) times daily.   . pravastatin (PRAVACHOL) 20 MG tablet Take 20 mg by mouth at bedtime.   . torsemide (DEMADEX) 20 MG tablet Take 1 tablet (20 mg) by mouth twice daily. Every other day, take 2 tablets (40 mg) in the morning and 1 tablet (20 mg) in the evening. (Patient taking differently: Take 20-40 mg by mouth See admin instructions. Alternate taking 20 mg twice daily on day one and 40 mg in the morning and 20 mg in the evening on day two)  . [DISCONTINUED] metoprolol tartrate (LOPRESSOR) 25 MG tablet Take 1 tablet (25 mg total) by mouth 3 (three) times daily.     Allergies:   Patient has no known allergies.   Social History   Socioeconomic History  . Marital status: Married     Spouse name: Not on file  . Number of children: Not on file  . Years of education: Not on file  . Highest education level: Not on file  Occupational History  . Not on file  Social Needs  . Financial resource strain: Not on file  . Food insecurity:    Worry: Not on file    Inability: Not on file  . Transportation needs:    Medical: Not on file    Non-medical: Not on file  Tobacco Use  . Smoking status: Never Smoker  . Smokeless tobacco: Never Used  Substance and Sexual Activity  . Alcohol use: No    Frequency: Never  . Drug use: No  . Sexual activity: Not on file  Lifestyle  . Physical activity:    Days per week: Not on file    Minutes per session: Not on file  . Stress: Not on file  Relationships  . Social connections:    Talks on phone: Not on file    Gets together: Not on file    Attends religious service: Not on file    Active member of club or organization: Not on file    Attends meetings of clubs or organizations: Not on file    Relationship status: Not on file  Other Topics Concern  . Not on file  Social History Narrative  . Not on file     Family History: The patient's family history includes Heart attack in her mother; Parkinson's disease in her father. ROS:   Please see the history of present illness.    All other systems reviewed and are negative.  EKGs/Labs/Other Studies Reviewed:    The following studies were reviewed today: Recent Labs: 06/26/2018: BUN 18; Creatinine, Ser 1.08; NT-Pro BNP 698; Potassium 3.9; Sodium 141 07/10/2018: Hemoglobin 9.1; Platelets 386  Recent Lipid Panel No results found for: CHOL, TRIG, HDL, CHOLHDL, VLDL, LDLCALC, LDLDIRECT  Physical Exam:    VS:  BP (!) 142/82 (BP Location: Left Arm, Patient Position: Sitting, Cuff Size: Large)   Pulse 88   Ht 5\' 2"  (1.575 m)   Wt 296 lb (134.3 kg)   SpO2 91%   BMI 54.14 kg/m     Wt Readings from Last 3 Encounters:  08/14/18 296 lb (134.3 kg)  07/10/18 295 lb 8 oz (134 kg)    06/26/18 291 lb 6.4 oz (132.2 kg)     GEN: Marked obesity BMI exceeds 50 well nourished, well developed in no acute distress HEENT: Normal NECK: No JVD; No carotid bruits LYMPHATICS: No lymphadenopathy CARDIAC: Dramatic change grade 1/6 to 2/6 localized ejection murmur the aortic area does not radiate to the carotids peaks mid no AR consistent with a marked reduced degree of LV outflow tract obstruction RRR, no  rubs, gallops RESPIRATORY:  Clear to auscultation without rales, wheezing or rhonchi  ABDOMEN: Soft, non-tender, non-distended MUSCULOSKELETAL: Chronic lower extremity pitting and nonpitting edema multifactorial edema; No deformity  SKIN: Warm and dry NEUROLOGIC:  Alert and oriented x 3 PSYCHIATRIC:  Normal affect    Signed, Shirlee More, MD  08/14/2018 12:00 PM    [place]

## 2018-08-17 ENCOUNTER — Telehealth: Payer: Self-pay

## 2018-08-17 DIAGNOSIS — I110 Hypertensive heart disease with heart failure: Secondary | ICD-10-CM

## 2018-08-17 DIAGNOSIS — R600 Localized edema: Secondary | ICD-10-CM

## 2018-08-17 MED ORDER — TORSEMIDE 20 MG PO TABS: 20.0000 mg | ORAL_TABLET | Freq: Two times a day (BID) | ORAL | 3 refills | Status: DC

## 2018-08-17 NOTE — Telephone Encounter (Signed)
Patient advised to reduce torsemide to 20mg  twice daily and recheck BMP in 2 weeks per Dr Bettina Gavia.  Patient agreed to plan and verbalized understanding.

## 2018-08-17 NOTE — Telephone Encounter (Signed)
-----   Message from Richardo Priest, MD sent at 08/16/2018  9:47 AM EST ----- Lets reduce furosemide to only 20 mg bid and recheck 2 weeks

## 2018-08-18 ENCOUNTER — Telehealth: Payer: Self-pay | Admitting: *Deleted

## 2018-08-18 NOTE — Telephone Encounter (Signed)
-----   Message from Austin Miles, RN sent at 08/14/2018 10:49 AM EST ----- Regarding: Please precert Please precert this patient to have a sleep study due to risk of sleep apnea per Dr. Bettina Gavia. Thanks!

## 2018-08-18 NOTE — Telephone Encounter (Signed)
Staff message sent to [facility] per Dewain Penning @ [employer] no PA is required for sleep study. Ok to schedule.

## 2018-08-21 ENCOUNTER — Telehealth: Payer: Self-pay

## 2018-08-21 NOTE — Telephone Encounter (Signed)
Left voicemail message informing patient of scheduled sleep study at the [facility] on 10/08/2018 at [place] pt that their office will mail her the paperwork. Advised patient to contact our office with any questions or concerns.

## 2018-09-04 ENCOUNTER — Telehealth: Payer: Self-pay | Admitting: Cardiology

## 2018-09-04 NOTE — Telephone Encounter (Signed)
Patient's spouse called to inform us that patient has twice fell asleep standing up and fallen backwards hurting herself. He is asking if sleep study can be moved up?

## 2018-09-07 ENCOUNTER — Telehealth: Payer: Self-pay | Admitting: Cardiology

## 2018-09-07 NOTE — Telephone Encounter (Signed)
Left VM for pt to return call.

## 2018-09-07 NOTE — Telephone Encounter (Signed)
Spoke with patient and advised her and her husband to contact the [facility] to discuss scheduling of sleep study. Patient reports that she has already spoken with them and they are trying to get her sleep study completed sooner.

## 2018-09-07 NOTE — Telephone Encounter (Signed)
Patient advised that a BMP needed to be rechecked after Dr. Bettina Gavia reduced her dose of furosemide on 08/17/18. Patient's home health RN was there and patient wanted me to speak with him. Corene Cornea, from [place], is managing patient's home IV antibiotics and drew labs including a CMP. Corene Cornea will send these lab results to our office for Dr. Bettina Gavia to review.

## 2018-09-14 ENCOUNTER — Telehealth: Payer: Self-pay | Admitting: Cardiology

## 2018-09-14 NOTE — Telephone Encounter (Signed)
Left message for patient's husband, [name], to return call per DPR.

## 2018-09-14 NOTE — Telephone Encounter (Signed)
Dan called back and advised that patient's sleep study does not require a prior authorization with her current insurance. Dan verbalized understanding. No further questions.

## 2018-09-14 NOTE — Telephone Encounter (Signed)
Patient is asking if we precerted her sleep study. Please advise.

## 2018-09-15 ENCOUNTER — Encounter

## 2018-09-15 ENCOUNTER — Ambulatory Visit (HOSPITAL_BASED_OUTPATIENT_CLINIC_OR_DEPARTMENT_OTHER): Payer: BC Managed Care – PPO | Attending: Cardiology | Admitting: Cardiovascular Disease

## 2018-09-15 VITALS — Ht 62.0 in | Wt 285.0 lb

## 2018-09-15 DIAGNOSIS — Z79899 Other long term (current) drug therapy: Secondary | ICD-10-CM | POA: Insufficient documentation

## 2018-09-15 DIAGNOSIS — I10 Essential (primary) hypertension: Secondary | ICD-10-CM | POA: Insufficient documentation

## 2018-09-15 DIAGNOSIS — G4733 Obstructive sleep apnea (adult) (pediatric): Secondary | ICD-10-CM | POA: Insufficient documentation

## 2018-09-15 DIAGNOSIS — Z6841 Body Mass Index (BMI) 40.0 and over, adult: Secondary | ICD-10-CM | POA: Insufficient documentation

## 2018-09-15 DIAGNOSIS — R0902 Hypoxemia: Secondary | ICD-10-CM | POA: Diagnosis not present

## 2018-09-15 DIAGNOSIS — G4731 Primary central sleep apnea: Secondary | ICD-10-CM | POA: Diagnosis not present

## 2018-09-15 DIAGNOSIS — R351 Nocturia: Secondary | ICD-10-CM | POA: Diagnosis not present

## 2018-09-15 DIAGNOSIS — Z7982 Long term (current) use of aspirin: Secondary | ICD-10-CM | POA: Insufficient documentation

## 2018-09-15 DIAGNOSIS — I421 Obstructive hypertrophic cardiomyopathy: Secondary | ICD-10-CM | POA: Diagnosis present

## 2018-09-20 ENCOUNTER — Encounter (HOSPITAL_BASED_OUTPATIENT_CLINIC_OR_DEPARTMENT_OTHER): Payer: Self-pay | Admitting: Cardiovascular Disease

## 2018-09-20 NOTE — Procedures (Signed)
Patient Name: [name], [name] Date: 09/15/2018 Gender: Female D.O.B: 11/07/1955 Age (years): 63 Referring Provider: Shirlee More Height (inches): 27 Interpreting Physician: Shelva Majestic MD, ABSM Weight (lbs): 285 RPSGT: Rebekah [place] BMI: 52 MRN: [id number] Neck Size: 17.00  CLINICAL INFORMATION Sleep Study Type: NPSG  Indication for sleep study: Fatigue, Hypertension, Morbid Obesity, Snoring  Epworth Sleepiness Score: 11  SLEEP STUDY TECHNIQUE As per the AASM Manual for the Scoring of Sleep and Associated Events v2.3 (April 2016) with a hypopnea requiring 4% desaturations.  The channels recorded and monitored were frontal, central and occipital EEG, electrooculogram (EOG), submentalis EMG (chin), nasal and oral airflow, thoracic and abdominal wall motion, anterior tibialis EMG, snore microphone, electrocardiogram, and pulse oximetry.  MEDICATIONS     acetaminophen-codeine (TYLENOL #3) 300-30 MG tablet             amoxicillin (AMOXIL) 500 MG capsule         aspirin EC 81 MG tablet         ibuprofen (ADVIL,MOTRIN) 200 MG tablet         metoprolol tartrate (LOPRESSOR) 50 MG tablet         norethindrone (AYGESTIN) 5 MG tablet         pravastatin (PRAVACHOL) 20 MG tablet         torsemide (DEMADEX) 20 MG tablet      Medications self-administered by patient taken the night of the study : N/A  SLEEP ARCHITECTURE The study was initiated at 10:59:23 PM and ended at 5:06:29 AM.  Sleep onset time was 0.0 minutes and the sleep efficiency was 24.2%%. The total sleep time was 89 minutes.  Stage REM latency was 292.0 minutes.  The patient spent 15.2%% of the night in stage N1 sleep, 60.1%% in stage N2 sleep, 0.0%% in stage N3 and 24.7% in REM.  Alpha intrusion was absent.  Supine sleep was 43.72%.  RESPIRATORY PARAMETERS The overall apnea/hypopnea index (AHI) was 120.7 per hour. The respiratory disturbance index (RDI) was 131.5. There were 159 total apneas,  including 80 obstructive, 79 central and 0 mixed apneas. There were 20 hypopneas and 16 RERAs.  The AHI during Stage REM sleep was 147.3 per hour.  AHI while supine was 74.0 per hour.  The mean oxygen saturation was 85.6%. The minimum SpO2 during sleep was 59.0%.  Loud snoring was noted during this study.  CARDIAC DATA The 2 lead EKG demonstrated sinus rhythm. The mean heart rate was 79.4 beats per minute. Other EKG findings include: None.  LEG MOVEMENT DATA The total PLMS were 0 with a resulting PLMS index of 0.0. Associated arousal with leg movement index was 0.0 .  IMPRESSIONS - Very severe obstructive sleep apnea  (AHI 120.7/h; RDI 131.5/h); events were worse during REM sleep (AHI 147.3/h).  - Severe central sleep apnea occurred during this study (CAI = 53.3/h). - Severe oxygen desaturation to a nadir of 59.0%. - The patient snored with loud snoring volume. - Significantly reduced sleep efficiency at 24.2%. The patient have frequent nocturia and had 8 bathroom breaks. Due to markedly reduced sleep efficiency a split night study was not able to be performed. - No cardiac abnormalities were noted during this study. - Clinically significant periodic limb movements did not occur during sleep. No significant associated arousals.  DIAGNOSIS - Obstructive Sleep Apnea (327.23 [G47.33 ICD-10]) - Central Sleep Apnea (327.27 [G47.37 ICD-10]) - Nocturnal Hypoxemia (327.26 [G47.36 ICD-10])  RECOMMENDATIONS - Recommend expeditious CPAP titration to determine optimal pressure required  to alleviate very severe sleep disordered breathing. BiPAP or ASV titration may be required to eliminate central sleep apnea. Due to significant leg discomfort it was recommended that future studies be done in a recliner. - Efforts should be made to optimize nasal and oropharyngeal patency. - Avoid alcohol, sedatives and other CNS depressants that may worsen sleep apnea and disrupt normal sleep architecture. -  Sleep hygiene should be reviewed to assess factors that may improve sleep quality. - Weight management (BMI 52) and regular exercise should be initiated or continued if appropriate.  [Electronically signed] 09/20/2018 03:34 PM  Shelva Majestic MD, [facility], ABSM Diplomate, American Board of Sleep Medicine   NPI: [id number] [place] PH: [phone]   FX: [phone] [place]

## 2018-09-21 ENCOUNTER — Telehealth: Payer: Self-pay | Admitting: *Deleted

## 2018-09-21 ENCOUNTER — Ambulatory Visit (HOSPITAL_BASED_OUTPATIENT_CLINIC_OR_DEPARTMENT_OTHER): Payer: BC Managed Care – PPO | Attending: Cardiovascular Disease | Admitting: Cardiovascular Disease

## 2018-09-21 ENCOUNTER — Other Ambulatory Visit: Payer: Self-pay | Admitting: Cardiovascular Disease

## 2018-09-21 VITALS — Ht 63.0 in | Wt 285.0 lb

## 2018-09-21 DIAGNOSIS — G4733 Obstructive sleep apnea (adult) (pediatric): Secondary | ICD-10-CM | POA: Insufficient documentation

## 2018-09-21 DIAGNOSIS — G4736 Sleep related hypoventilation in conditions classified elsewhere: Secondary | ICD-10-CM

## 2018-09-21 DIAGNOSIS — G4731 Primary central sleep apnea: Secondary | ICD-10-CM

## 2018-09-21 DIAGNOSIS — IMO0002 Reserved for concepts with insufficient information to code with codable children: Secondary | ICD-10-CM

## 2018-09-21 NOTE — Telephone Encounter (Signed)
Patient notified of sleep study results and recommendations. She has agreed to have CPAP titration study scheduled for tonight. Per Zigmund Daniel J@ [employer] no PA is required.

## 2018-09-21 NOTE — Telephone Encounter (Signed)
-----   Message from Martha Sine, MD sent at 09/20/2018  3:38 PM EST ----- [name] please notify pt and set up for expeditious titration study with CPAP and probable need for BiPAP/ASV

## 2018-09-25 ENCOUNTER — Ambulatory Visit (INDEPENDENT_AMBULATORY_CARE_PROVIDER_SITE_OTHER): Payer: BC Managed Care – PPO | Admitting: Cardiology

## 2018-09-25 ENCOUNTER — Encounter: Payer: Self-pay | Admitting: Cardiology

## 2018-09-25 VITALS — BP 142/80 | HR 89 | Ht 62.0 in | Wt 292.8 lb

## 2018-09-25 DIAGNOSIS — Z954 Presence of other heart-valve replacement: Secondary | ICD-10-CM

## 2018-09-25 DIAGNOSIS — I421 Obstructive hypertrophic cardiomyopathy: Secondary | ICD-10-CM | POA: Diagnosis not present

## 2018-09-25 DIAGNOSIS — I110 Hypertensive heart disease with heart failure: Secondary | ICD-10-CM | POA: Diagnosis not present

## 2018-09-25 DIAGNOSIS — G4733 Obstructive sleep apnea (adult) (pediatric): Secondary | ICD-10-CM | POA: Diagnosis not present

## 2018-09-25 DIAGNOSIS — G4730 Sleep apnea, unspecified: Secondary | ICD-10-CM | POA: Insufficient documentation

## 2018-09-25 NOTE — Progress Notes (Signed)
Cardiology Office Note:    Date:  09/25/2018   ID:  [name], DOB [birth year], MRN [id number]  PCP:  [name], MD  Cardiologist:  Shirlee More, MD    Referring MD: [name], MD    ASSESSMENT:    1. Obstructive hypertrophic cardiomyopathy ([place])   2. Hypertensive heart disease with heart failure ([place])   3. H/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    PLAN:    In order of problems listed above:  1. Improved diminished murmur on exam less short of breath and marked decrease in volume overload continue current treatment with careful cautious diuretic and beta-blocker.  At some point in the future we will need to recheck an LV outflow tract gradient. 2. Improved she has much less volume overload continue her current diuretics. 3. Stable fortunately we did an echocardiogram on her and understand that we fixed her aortic stenosis while she has his muscular dynamic left ventricular outflow tract obstruction treated improved.  She asked me my opinion I told her I think that she is improved and ready to have her D&C send a copy to her physician at [facility].  Note she has yet to start CPAP for her severe obstructive sleep apnea and will need to be carefully watched in recovery area after the procedure   Next appointment: 3 months   Medication Adjustments/Labs and Tests Ordered: Current medicines are reviewed at length with the patient today.  Concerns regarding medicines are outlined above.  No orders of the defined types were placed in this encounter.  No orders of the defined types were placed in this encounter.   No chief complaint on file.   History of Present Illness:    [name] is a 63 y.o. female with a hx of  severe symptomatic aortic stenosis with heart failure and underwent 23 mm Hancock SAVR on 03/30/13 Dr Clementeen Graham at [facility] , hypertension and heart failure. Recent TEE showed dynamic LVOT obstruction and normal AVR function. Recently  diagnosed severe obstructive and central sleep apnea with nocturnal hypoxemia nadir 59% and initiated on CPAP.[name] She was last seen 08/14/18. Her predominant problem is non healing leg ulcers and infection. Compliance with diet, lifestyle and medications: Yes  She is objectively improved and is yet to start CPAP off antibiotics much less edema and her wound is down to about a quarter in size.  No fever chills less short of breath she is able to be supine less edema no chest pain palpitation or syncope no fever chills recent labs show normal renal function potassium 3.5 sedimentation rate decreased to 42 white count decreased to 13,000 with IV antibiotics for wound infection Past Medical History:  Diagnosis Date  . Aortic stenosis 03/23/2013   Overview:  02/18/13 TTE EF >55%. Critical AS with mean Ao valve gradient of 82 mm Hg. No AI. No MR, PR, mild TR. Estimated RVSP 30 mm Hg.  . Bicuspid aortic valve   . Edema of both legs 02/20/2017  . 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 Heart failure ([place])   . HTN (hypertension) 03/23/2013  . 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 Morbid obesity ([place]) 02/20/2017    Past Surgical History:  Procedure Laterality Date  . TEE WITHOUT CARDIOVERSION N/A 07/01/2018   Procedure: TRANSESOPHAGEAL ECHOCARDIOGRAM (TEE);  Surgeon: Buford Dresser, MD;  Location: [facility] ENDOSCOPY;  Service: Cardiovascular;  Laterality: N/A;  . TISSUE AORTIC VALVE REPLACEMENT      Current  Medications: No outpatient medications have been marked as taking for the 09/25/18 encounter (Appointment) with Richardo Priest, MD.     Allergies:   Patient has no known allergies.   Social History   Socioeconomic History  . Marital status: Married    Spouse name: Not on file  . Number of children: Not on file  . Years of education: Not on file  . Highest education level: Not on file  Occupational History  . Not on file  Social Needs  . Financial resource strain: Not on file    . Food insecurity:    Worry: Not on file    Inability: Not on file  . Transportation needs:    Medical: Not on file    Non-medical: Not on file  Tobacco Use  . Smoking status: Never Smoker  . Smokeless tobacco: Never Used  Substance and Sexual Activity  . Alcohol use: No    Frequency: Never  . Drug use: No  . Sexual activity: Not on file  Lifestyle  . Physical activity:    Days per week: Not on file    Minutes per session: Not on file  . Stress: Not on file  Relationships  . Social connections:    Talks on phone: Not on file    Gets together: Not on file    Attends religious service: Not on file    Active member of club or organization: Not on file    Attends meetings of clubs or organizations: Not on file    Relationship status: Not on file  Other Topics Concern  . Not on file  Social History Narrative  . Not on file     Family History: The patient's family history includes Heart attack in her mother; Parkinson's disease in her father. ROS:   Please see the history of present illness.    All other systems reviewed and are negative.  EKGs/Labs/Other Studies Reviewed:    The following studies were reviewed today:  Recent Labs: 07/10/2018: Hemoglobin 9.1; Platelets 386 08/14/2018: BUN 23; Creatinine, Ser 1.51; NT-Pro BNP 876; Potassium 4.1; Sodium 139  Recent Lipid Panel No results found for: CHOL, TRIG, HDL, CHOLHDL, VLDL, LDLCALC, LDLDIRECT  Physical Exam:    VS:  There were no vitals taken for this visit.    Wt Readings from Last 3 Encounters:  09/21/18 285 lb (129.3 kg)  09/15/18 285 lb (129.3 kg)  08/14/18 296 lb (134.3 kg)     GEN: Obese Well nourished, well developed in no acute distress HEENT: Normal NECK: No JVD; No carotid bruits LYMPHATICS: No lymphadenopathy CARDIAC: RRR, no murmurs, rubs, gallops RESPIRATORY:  Clear to auscultation without rales, wheezing or rhonchi  ABDOMEN: Soft, non-tender, non-distended MUSCULOSKELETAL:  2-3+ pitting   edema; No deformity  SKIN: Warm and dry NEUROLOGIC:  Alert and oriented x 3 PSYCHIATRIC:  Normal affect    Signed, Shirlee More, MD  09/25/2018 8:10 AM    [place]

## 2018-09-25 NOTE — Patient Instructions (Signed)
Medication Instructions:  Your physician recommends that you continue on your current medications as directed. Please refer to the Current Medication list given to you today.  If you need a refill on your cardiac medications before your next appointment, please call your pharmacy.   Lab work: None   If you have labs (blood work) drawn today and your tests are completely normal, you will receive your results only by: . MyChart Message (if you have MyChart) OR . A paper copy in the mail If you have any lab test that is abnormal or we need to change your treatment, we will call you to review the results.  Testing/Procedures: None  Follow-Up: At [facility], you and your health needs are our priority.  As part of our continuing mission to provide you with exceptional heart care, we have created designated Provider Care Teams.  These Care Teams include your primary Cardiologist (physician) and Advanced Practice Providers (APPs -  Physician Assistants and Nurse Practitioners) who all work together to provide you with the care you need, when you need it. You will need a follow up appointment in 3 months.  Please call our office 2 months in advance to schedule this appointment.     

## 2018-10-08 ENCOUNTER — Encounter (HOSPITAL_BASED_OUTPATIENT_CLINIC_OR_DEPARTMENT_OTHER): Payer: BC Managed Care – PPO

## 2018-10-11 ENCOUNTER — Encounter (HOSPITAL_BASED_OUTPATIENT_CLINIC_OR_DEPARTMENT_OTHER): Payer: Self-pay | Admitting: Cardiovascular Disease

## 2018-10-11 NOTE — Procedures (Signed)
Patient Name: [name], [name] Date: 09/21/2018 Gender: Female D.O.B: [birth year] Age (years): 63 Referring Provider: Shirlee More Height (inches): 62 Interpreting Physician: Shelva Majestic MD, ABSM Weight (lbs): 285 RPSGT: Rebekah Chesterfield BMI: 52 MRN: [id number] Neck Size: 17.00  CLINICAL INFORMATION The patient is referred for a BiPAP titration to treat sleep apnea.  Date of NPSG: 09/15/2018: AHI 120.7/h; RDI 131.5/h: REM sleep AHI 147.3/h; very severe O2 desaturation to 59%.  SLEEP STUDY TECHNIQUE As per the AASM Manual for the Scoring of Sleep and Associated Events v2.3 (April 2016) with a hypopnea requiring 4% desaturations.  The channels recorded and monitored were frontal, central and occipital EEG, electrooculogram (EOG), submentalis EMG (chin), nasal and oral airflow, thoracic and abdominal wall motion, anterior tibialis EMG, snore microphone, electrocardiogram, and pulse oximetry. Bilevel positive airway pressure (BPAP) was initiated at the beginning of the study and titrated to treat sleep-disordered breathing.  MEDICATIONS     acetaminophen-codeine (TYLENOL #3) 300-30 MG tablet             aspirin EC 81 MG tablet         ibuprofen (ADVIL,MOTRIN) 200 MG tablet         metoprolol tartrate (LOPRESSOR) 50 MG tablet         norethindrone (AYGESTIN) 5 MG tablet         pravastatin (PRAVACHOL) 20 MG tablet         torsemide (DEMADEX) 20 MG tablet      Medications self-administered by patient taken the night of the study : DUEXIS  RESPIRATORY PARAMETERS Optimal IPAP Pressure (cm): 27 AHI at Optimal Pressure (/hr) 6.4 Optimal EPAP Pressure (cm): 22   Overall Minimal O2 (%): 64.0 Minimal O2 at Optimal Pressure (%): 89.0  SLEEP ARCHITECTURE Start Time: 10:27:04 PM Stop Time: 5:13:50 AM Total Time (min): 406.8 Total Sleep Time (min): 206.5 Sleep Latency (min): 0.0 Sleep Efficiency (%): 50.8% REM Latency (min): 0.0 WASO (min): 200.3 Stage N1 (%): 8.0% Stage N2  (%): 59.8% Stage N3 (%): 1.2% Stage R (%): 31 Supine (%): 0.00 Arousal Index (/hr): 17.4   CARDIAC DATA The 2 lead EKG demonstrated sinus rhythm. The mean heart rate was 77.2 beats per minute. Other EKG findings include: None.  LEG MOVEMENT DATA The total Periodic Limb Movements of Sleep (PLMS) were 0. The PLMS index was 0.0. A PLMS index of <15 is considered normal in adults.  IMPRESSIONS - CPAP was initiated at 7 cm and was titrated to 20 cm; AHI at 20 cm was 47.6/h. BiPAP was implemented at 22/18 and was titrated to 27/22; AHI at 27/22 was 6.4/h with O2 nadir at 89% without REM sleep at this pressure. - Rare central sleep apnea was noted during this titration (CAI  0.3/h). - Severe oxygen desaturations to a nadir of 64% at 7 cwp. - The patient snored with moderate snoring volume. - No cardiac abnormalities were observed during this study. - Clinically significant periodic limb movements were not noted during this study. Arousals associated with PLMs were rare.  DIAGNOSIS - Obstructive Sleep Apnea (327.23 [G47.33 ICD-10])  RECOMMENDATIONS - Recommend an initial trial of BiPAP Auto therapy with an EPAP min of 22, PS of 5 and IPAP max of 30 cm H2O with heated humidification. BiPAP Auto SV may be necessary if BiPAP alone unable to be titrated to 30 cm.  A Medium size [employer]kel Full Face Mask Simplus mask was used for the titration study. - Effort should be made to optimize nasal  and orophayngeal patency. - Avoid alcohol, sedatives and other CNS depressants that may worsen sleep apnea and disrupt normal sleep architecture. - Sleep hygiene should be reviewed to assess factors that may improve sleep quality. - Weight management and regular exercise should be initiated or continued. - Recommend a download in 14 and 30 days and sleep clinic evaluation after 4 weeks of therapy.  [Electronically signed] 10/11/2018 11:09 AM  Shelva Majestic MD, [facility], [place], American Board of Sleep  Medicine   NPI: [id number] [place] PH: [id number]   FX: [id number] [place]

## 2018-10-12 ENCOUNTER — Telehealth: Payer: Self-pay | Admitting: Cardiology

## 2018-10-12 NOTE — Telephone Encounter (Signed)
I think 3 L a day would be a good balance for her and she would be at risk of hyponatremia with her diuretics with vigorous fluid loading

## 2018-10-12 NOTE — Telephone Encounter (Signed)
Patient advised that 3 liters of fluid with be a good balance for her history of heart failure and valve replacement.  Patient knows to weigh herself daily and contact our office with weight gain of 3-5 pounds daily.   Patient agreed to plan and verbalized understanding.

## 2018-10-12 NOTE — Telephone Encounter (Signed)
Patient called and she is going to a weight loss program and they want you to drink a lot of water and she is checking to make sure since she takes a diuretic and has chf/valve replacement and she wants to make sure how much to drink or NOT to drink.

## 2018-10-13 ENCOUNTER — Telehealth: Payer: Self-pay | Admitting: *Deleted

## 2018-10-13 NOTE — Telephone Encounter (Signed)
Called patoent to inform her that her sleep study has been completed and we will be ordering a BIPAP machine for her. She informs me that she already has a machine. I asked her who ordered this. She did not know. She provided me with information from the machine. The company is [employer]. I contacted them and was told the ordering MD was Dr Lin Landsman, which is her PCP. He ordered a Airsense 10 CPAP machine. Dr Claiborne Billings will be notified due to patient needing a ASV instead of the CPAP machine that was ordered.

## 2018-10-14 ENCOUNTER — Telehealth: Payer: Self-pay | Admitting: *Deleted

## 2018-10-14 NOTE — Telephone Encounter (Signed)
Spoke with Charleston Ropes, Dr Redding's nurse to ask if Dr Lin Landsman had ordered a sleep study in addition to ours. There seems to be some confusion about the ordering of the patient's machine. Charleston Ropes informs me that Dr Lin Landsman has not ordered a sleep study. He must have been "CC'd" on the one we ordered. I informed her that the sleep study recommended for the patient to have a CPAP titration which was done and read by Dr Claiborne Billings this past weekend. When I called the patient to tell her we are ordering a BIPAP ASV  I was told by her that she already has a machine. Upon further investigation it was found out that a CPAP machine was ordered by Dr Lin Landsman. Charleston Ropes stated that Dr Lin Landsman "honestly probably just looked at the report and saw she needed a titration without really paying close attention and ordered the CPAP machine based on that." I informed Daisy the CPAP machine is not what Dr Claiborne Billings wants her to be treated with. He feels this will not treat her OSA due to the severity. I asked Charleston Ropes if they plan to manage her OSA, or did they want Dr Claiborne Billings to. She states that since we ordered the sleep study initially, we should be the ones to manage her. She states she will call [employer] and cancel the order and have them to pick up the CPAP machine ordered by Dr Lin Landsman. The patient was notified of the situation and  made aware that a order for the recommended sleep machine has been sent to [facility]. Once they have received  approval from her insurance they will contact her to get set up on the new machine. Patient voiced verbal understanding asking me when I thought [employer] will pick up her current machine. I told her I do not know. It will depend on when Dr Welford Roche office contact them to cancel the order.

## 2018-10-19 ENCOUNTER — Telehealth: Payer: Self-pay | Admitting: *Deleted

## 2018-10-19 NOTE — Telephone Encounter (Signed)
Received a call from [facility] with [employer] after she spoke with the patient and explained her benefits and costs , the patient decided to keep her service with [employer]. She does not want to have to repay for a humidfier out of her pocket. Orders for BIPAP machine has been sent to [employer].

## 2018-10-21 ENCOUNTER — Telehealth: Payer: Self-pay | Admitting: Cardiology

## 2018-10-21 NOTE — Telephone Encounter (Signed)
Patient returned your call and would like a call back at [phone]

## 2018-10-21 NOTE — Telephone Encounter (Signed)
Left message to return call 

## 2018-10-21 NOTE — Telephone Encounter (Signed)
Taking 20mg  of torsimide 2 times a day and has questions about gaining more than 3 pounds

## 2018-10-22 NOTE — Telephone Encounter (Signed)
Patient returned call and states that when she had her aortic valve replacement approximately 6 years ago, she took a diuretic 3 days a week and would take an additional dose if she gained 2-3 pounds in a day/overnight. Patient has not had any weight gain but just wanted to know what to do if this were to occur. Advised patient to contact our office if she were have any weight gain for Dr. Bettina Gavia to advise at that time. Patient verbalized understanding. No further questions.

## 2018-10-30 ENCOUNTER — Telehealth: Payer: Self-pay | Admitting: Cardiology

## 2018-10-30 NOTE — Telephone Encounter (Signed)
Left voicemail that patient should contact [name] with Dr. Evette Georges office about how long she should wear her CPAP.

## 2018-10-30 NOTE — Telephone Encounter (Signed)
Wants to know how any hours a night to use her CPAP machine

## 2018-12-18 ENCOUNTER — Telehealth: Payer: Self-pay | Admitting: Cardiology

## 2018-12-18 NOTE — Telephone Encounter (Signed)
Cardiac Questionnaire:    Since your last visit or hospitalization:    1. Have you been having new or worsening chest pain? no   2. Have you been having new or worsening shortness of breath? no 3. Have you been having new or worsening leg swelling, wt gain, or increase in abdominal girth (pants fitting more tightly)? no   4. Have you had any passing out spells? no    *A YES to any of these questions would result in the appointment being kept. *If all the answers to these questions are NO, we should indicate that given the current situation regarding the worldwide coronarvirus pandemic, at the recommendation of the CDC, we are looking to limit gatherings in our waiting area, and thus will reschedule their appointment beyond four weeks from today.   _____________   [id number] Pre-Screening Questions:   Do you currently have a fever? no (yes = cancel and refer to pcp for e-visit)  Have you recently travelled on a cruise, internationally, or to [place], [place], [place], [place], [place], or [place], [place]) ? no (yes = cancel, stay home, monitor symptoms, and contact pcp or initiate e-visit if symptoms develop)  Have you been in contact with someone that is currently pending confirmation of Covid19 testing or has been confirmed to have the [place] virus?  no (yes = cancel, stay home, away from tested individual, monitor symptoms, and contact pcp or initiate e-visit if symptoms develop)  Are you currently experiencing fatigue or cough? no (yes = pt should be prepared to have a mask placed at the time of their visit).         Virtual Visit Pre-Appointment Phone Call  Steps For Call:  1. Confirm consent - "In the setting of the current Covid19 crisis, you are scheduled for a (phone or video) visit with your provider on (date) at (time).  Just as we do with many in-office visits, in order for you to participate in this visit, we must obtain consent.  If you'd like, I can send this to your mychart (if  signed up) or email for you to review.  Otherwise, I can obtain your verbal consent now.  All virtual visits are billed to your insurance company just like a normal visit would be.  By agreeing to a virtual visit, we'd like you to understand that the technology does not allow for your provider to perform an examination, and thus may limit your provider's ability to fully assess your condition.  Finally, though the technology is pretty good, we cannot assure that it will always work on either your or our end, and in the setting of a video visit, we may have to convert it to a phone-only visit.  In either situation, we cannot ensure that we have a secure connection.  Are you willing to proceed?"  2. Give patient instructions for WebEx download to smartphone as below if video visit  3. Advise patient to be prepared with any vital sign or heart rhythm information, their current medicines, and a piece of paper and pen handy for any instructions they may receive the day of their visit  4. Inform patient they will receive a phone call 15 minutes prior to their appointment time (may be from unknown caller ID) so they should be prepared to answer  5. Confirm that appointment type is correct in Epic appointment notes (video vs telephone)    TELEPHONE CALL NOTE  [name] has been deemed a candidate for  a follow-up tele-health visit to limit community exposure during the Covid-19 pandemic. I spoke with the patient via phone to ensure availability of phone/video source, confirm preferred email & phone number, and discuss instructions and expectations.  I reminded [name] to be prepared with any vital sign and/or heart rhythm information that could potentially be obtained via home monitoring, at the time of her visit. I reminded [name] to expect a phone call at the time of her visit if her visit.  Did the patient verbally acknowledge consent to treatment? YES  Isaiah Blakes 12/18/2018 10:44 AM   DOWNLOADING THE [place]  - If Apple, go to [employer] and type in WebEx in the search bar. [place] [employer], the blue/green circle. The app is free but as with any other app downloads, their phone may require them to verify saved payment information or Apple password. The patient does NOT have to create an account.  - If Android, ask patient to go to [employer] and type in WebEx in the search bar. [place] [employer], the blue/green circle. The app is free but as with any other app downloads, their phone may require them to verify saved payment information or Android password. The patient does NOT have to create an account.   CONSENT FOR TELE-HEALTH VISIT - PLEASE REVIEW  I hereby voluntarily request, consent and authorize [facility] and its employed or contracted physicians, physician assistants, nurse practitioners or other licensed health care professionals (the Practitioner), to provide me with telemedicine health care services (the Services") as deemed necessary by the treating Practitioner. I acknowledge and consent to receive the Services by the Practitioner via telemedicine. I understand that the telemedicine visit will involve communicating with the Practitioner through live audiovisual communication technology and the disclosure of certain medical information by electronic transmission. I acknowledge that I have been given the opportunity to request an in-person assessment or other available alternative prior to the telemedicine visit and am voluntarily participating in the telemedicine visit.  I understand that I have the right to withhold or withdraw my consent to the use of telemedicine in the course of my care at any time, without affecting my right to future care or treatment, and that the Practitioner or I may terminate the telemedicine visit at any time. I understand that I have the right to inspect  all information obtained and/or recorded in the course of the telemedicine visit and may receive copies of available information for a reasonable fee.  I understand that some of the potential risks of receiving the Services via telemedicine include:   Delay or interruption in medical evaluation due to technological equipment failure or disruption;  Information transmitted may not be sufficient (e.g. poor resolution of images) to allow for appropriate medical decision making by the Practitioner; and/or   In rare instances, security protocols could fail, causing a breach of personal health information.  Furthermore, I acknowledge that it is my responsibility to provide information about my medical history, conditions and care that is complete and accurate to the best of my ability. I acknowledge that Practitioner's advice, recommendations, and/or decision may be based on factors not within their control, such as incomplete or inaccurate data provided by me or distortions of diagnostic images or specimens that may result from electronic transmissions. I understand that the practice of medicine is not an exact science and that Practitioner makes no warranties or guarantees regarding treatment outcomes. I  acknowledge that I will receive a copy of this consent concurrently upon execution via email to the email address I last provided but may also request a printed copy by calling the office of [place].    I understand that my insurance will be billed for this visit.   I have read or had this consent read to me.  I understand the contents of this consent, which adequately explains the benefits and risks of the Services being provided via telemedicine.   I have been provided ample opportunity to ask questions regarding this consent and the Services and have had my questions answered to my satisfaction.  I give my informed consent for the services to be provided through the use of telemedicine in my  medical care  By participating in this telemedicine visit I agree to the above.

## 2018-12-21 NOTE — Progress Notes (Signed)
Virtual Visit via Video Note   This visit type was conducted due to national recommendations for restrictions regarding the COVID-19 Pandemic (e.g. social distancing) in an effort to limit this patient's exposure and mitigate transmission in our community.  Due to her co-morbid illnesses, this patient is at least at moderate risk for complications without adequate follow up.  This format is felt to be most appropriate for this patient at this time.  All issues noted in this document were discussed and addressed.  A limited physical exam was performed with this format.  Please refer to the patient's chart for her consent to telehealth for [facility].   Evaluation Performed:  Follow-up visit  Date:  12/22/2018   ID:  [name], DOB 1956-04-26, MRN [id number]  Patient Location: Home  Provider Location: Home  PCP:  [name], MD  Cardiologist:  No primary care provider on file. [place] Electrophysiologist:  None   Chief Complaint:  Heart failure  History of Present Illness:    [name] is a 63 y.o. female who presents via audio/video conferencing for a telehealth visit today.    [name] is a 63 y.o. 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 Recent TEE showed dynamic LVOT obstruction and normal AVR function. She was  diagnosed with  severe obstructive and central sleep apnea with nocturnal hypoxemia nadir 59% and initiated on CPAP.Marland Kitchen She was last seen 08/14/18. Her predominant problem is non healing leg ulcers and infection and was pending a DandC for DUB.  The patient does not have symptoms concerning for COVID-19 infection (fever, chills, cough, or new shortness of breath).   Unfortunately after being discharged from the wound care center she has increasing edema in her legs and she has no open areas with drainage and is make an  appointment to follow back up with the group.  Her breathing is improved but she is short of breath when she tries to do housework.  She is very sedentary acknowledges she is not doing a good job restricting dietary sodium and was under the impression that water loading was beneficial and I have asked her to restrict her self to 2 to 3 L/day.  She has very unpredictable sleep habits she puts CPAP on just is not sure how long it stays on and when she wakes up at night she does not put it back on again.  She has no orthopnea chest pain palpitation or syncope and there GU bleeding is under control pending her D&C procedure.   Past Medical History:  Diagnosis Date  . Aortic stenosis 03/23/2013   Overview:  02/18/13 TTE EF >55%. Critical AS with mean Ao valve gradient of 82 mm Hg. No AI. No MR, PR, mild TR. Estimated RVSP 30 mm Hg.  . Bicuspid aortic valve   . Edema of both legs 02/20/2017  . 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 Heart failure ([place])   . HTN (hypertension) 03/23/2013  . 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 Morbid obesity ([place]) 02/20/2017   Past Surgical History:  Procedure Laterality Date  . TEE WITHOUT CARDIOVERSION N/A 07/01/2018   Procedure: TRANSESOPHAGEAL ECHOCARDIOGRAM (TEE);  Surgeon: Buford Dresser, MD;  Location: [facility] ENDOSCOPY;  Service: Cardiovascular;  Laterality: N/A;  . TISSUE AORTIC VALVE REPLACEMENT       Current Meds  Medication  Sig  . acetaminophen-codeine (TYLENOL #3) 300-30 MG tablet Take 1-2 tablets by mouth daily as needed.   Marland Kitchen aspirin EC 81 MG tablet Take 81 mg by mouth daily.  . metoprolol tartrate (LOPRESSOR) 50 MG tablet Take 1 tablet (50 mg total) by mouth 2 (two) times daily.  . norethindrone (AYGESTIN) 5 MG tablet Take 5 mg by mouth 2 (two) times daily.   . pravastatin (PRAVACHOL) 20 MG tablet Take 20 mg by mouth at bedtime.   . torsemide (DEMADEX) 20 MG tablet Take 1 tablet (20 mg total) by mouth 2 (two) times daily.     Allergies:    Patient has no known allergies.   Social History   Tobacco Use  . Smoking status: Never Smoker  . Smokeless tobacco: Never Used  Substance Use Topics  . Alcohol use: No    Frequency: Never  . Drug use: No     Family Hx: The patient's family history includes Heart attack in her mother; Parkinson's disease in her father.  ROS:   Please see the history of present illness.     All other systems reviewed and are negative.   Prior CV studies:   The following studies were reviewed today:  Labs/Other Tests and Data Reviewed:    EKG:  No ECG reviewed.  Recent Labs: 07/10/2018: Hemoglobin 9.1; Platelets 386 08/14/2018: BUN 23; Creatinine, Ser 1.51; NT-Pro BNP 876; Potassium 4.1; Sodium 139   Recent Lipid Panel No results found for: CHOL, TRIG, HDL, CHOLHDL, LDLCALC, LDLDIRECT  Wt Readings from Last 3 Encounters:  12/22/18 288 lb (130.6 kg)  09/25/18 292 lb 12.8 oz (132.8 kg)  09/21/18 285 lb (129.3 kg)     Objective:    Vital Signs:  BP (!) 152/86 (BP Location: Left Arm, Patient Position: Sitting)   Pulse 70   Ht 5\' 2"  (1.575 m)   Wt 288 lb (130.6 kg)   BMI 52.68 kg/m    Constitutional,very obese in no acute distress Vital signs reviewed Eyes, conjunctiva and sclera are normal without pallor or icterus extraocular motions intact and normal there is no lid lag Respiratory, normal effort and excursion no audible wheezing without a stethoscope Cardiovascular, no neck vein distention  Skin, no rash skin lesion or ulceration of the extremities Neurologic, cranial nerves II to XII are grossly intact and the patient moves all 4 extremities Neuro/Psychiatric, judgment and thought processes are intact and coherent, alert and oriented x3, mood and affect appear normal.  ASSESSMENT & PLAN:    1. Her heart failure is mildly decompensated I asked her to be very meticulous his sodium intake will take an extra tablet of diuretic any day she weighs 288 or greater she is up 3 to 5  pounds from baseline recheck labs including renal function potassium as well as proBNP and a CBC with her chronic iron deficiency anemia.  I encouraged her to be more active using a bicycle ergometer stricter with dietary sodium intake and educated to limit water to less than 2 to 3 L/day. 2. Stable she underwent a transesophageal echocardiogram in the last 6 months normal valve function 3. Improved she had severe shortness of breath and exercise intolerance and continue her beta-blocker.  Next office visit likely needs repeat echocardiogram 4. I am unsure how much she is benefiting from CPAP because of her erratic sleep cycles she has an appointment to follow-up with sleep medicine I encouraged her to be as compliant as she can 5. Worsened she is waiting a  call to be seen back in the wound center 6. Recheck her CBC in the past worsening heart failure is associated worsening anemia  COVID-19 Education: The signs and symptoms of COVID-19 were discussed with the patient and how to seek care for testing (follow up with PCP or arrange E-visit).  The importance of social distancing was discussed today.  Time:   Today, I have spent 39 minutes with the patient with telehealth technology discussing the above problems.     Medication Adjustments/Labs and Tests Ordered: Current medicines are reviewed at length with the patient today.  Concerns regarding medicines are outlined above.  Tests Ordered: No orders of the defined types were placed in this encounter.   Medication Changes: No orders of the defined types were placed in this encounter.   Disposition:  Follow up in 3 month(s)  Signed, Shirlee More, MD  12/22/2018 8:31 AM    [facility]

## 2018-12-22 ENCOUNTER — Telehealth (INDEPENDENT_AMBULATORY_CARE_PROVIDER_SITE_OTHER): Payer: BC Managed Care – PPO | Admitting: Cardiology

## 2018-12-22 ENCOUNTER — Other Ambulatory Visit: Payer: Self-pay

## 2018-12-22 ENCOUNTER — Encounter: Payer: Self-pay | Admitting: Cardiology

## 2018-12-22 VITALS — BP 152/86 | HR 70 | Ht 62.0 in | Wt 288.0 lb

## 2018-12-22 DIAGNOSIS — L97929 Non-pressure chronic ulcer of unspecified part of left lower leg with unspecified severity: Secondary | ICD-10-CM

## 2018-12-22 DIAGNOSIS — I421 Obstructive hypertrophic cardiomyopathy: Secondary | ICD-10-CM

## 2018-12-22 DIAGNOSIS — I83029 Varicose veins of left lower extremity with ulcer of unspecified site: Secondary | ICD-10-CM

## 2018-12-22 DIAGNOSIS — I110 Hypertensive heart disease with heart failure: Secondary | ICD-10-CM

## 2018-12-22 DIAGNOSIS — D508 Other iron deficiency anemias: Secondary | ICD-10-CM

## 2018-12-22 DIAGNOSIS — Z13220 Encounter for screening for lipoid disorders: Secondary | ICD-10-CM

## 2018-12-22 DIAGNOSIS — G4733 Obstructive sleep apnea (adult) (pediatric): Secondary | ICD-10-CM

## 2018-12-22 DIAGNOSIS — Z954 Presence of other heart-valve replacement: Secondary | ICD-10-CM

## 2018-12-22 MED ORDER — TORSEMIDE 20 MG PO TABS: ORAL_TABLET | ORAL | 2 refills | Status: DC

## 2018-12-22 NOTE — Patient Instructions (Addendum)
Medication Instructions:  INCREASE torsemide take 1 additional tablet daily as needed for weight greater than 288 lbs.  If you need a refill on your cardiac medications before your next appointment, please call your pharmacy.   Lab work: Your physician recommends that you have a CBC,BMP, lipid and CMP drawn this week.  If you have labs (blood work) drawn today and your tests are completely normal, you will receive your results only by: Marland Kitchen MyChart Message (if you have MyChart) OR . A paper copy in the mail If you have any lab test that is abnormal or we need to change your treatment, we will call you to review the results.  Testing/Procedures: NONE Follow-Up: At [facility], you and your health needs are our priority.  As part of our continuing mission to provide you with exceptional heart care, we have created designated Provider Care Teams.  These Care Teams include your primary Cardiologist (physician) and Advanced Practice Providers (APPs -  Physician Assistants and Nurse Practitioners) who all work together to provide you with the care you need, when you need it. You will need a follow up appointment in 3 months.   Any Other Special Instructions Will Be Listed Below   Heart Failure  Weigh yourself every morning when you first wake up and record on a calender or note pad, bring this to your office visits. Using a pill tender can help with taking your medications consistently.  Limit your fluid intake to 2 liters daily  Limit your sodium intake to less than 2-3 grams daily. Ask if you need dietary teaching.  If you gain more than 3 pounds (from your dry weight ), double your dose of diuretic for the day.  If you gain more than 5 pounds (from your dry weight), double your dose of lasix and call your heart failure doctor.  Please do not smoke tobacco since it is very bad for your heart.  Please do not drink alcohol since it can worsen your heart failure.Also avoid OTC nonsteroidal  drugs, such as advil, aleve and motrin.  Try to exercise for at least 30 minutes every day because this will help your heart be more efficient. You may be eligible for supervised cardiac rehab, ask your physician.

## 2018-12-24 ENCOUNTER — Telehealth: Payer: BC Managed Care – PPO | Admitting: Cardiology

## 2019-01-20 ENCOUNTER — Telehealth: Payer: Self-pay | Admitting: Cardiovascular Disease

## 2019-01-21 ENCOUNTER — Telehealth (INDEPENDENT_AMBULATORY_CARE_PROVIDER_SITE_OTHER): Payer: BC Managed Care – PPO | Admitting: Cardiovascular Disease

## 2019-01-21 VITALS — BP 132/71 | HR 78 | Ht 62.0 in | Wt 295.0 lb

## 2019-01-21 DIAGNOSIS — Z954 Presence of other heart-valve replacement: Secondary | ICD-10-CM

## 2019-01-21 DIAGNOSIS — I421 Obstructive hypertrophic cardiomyopathy: Secondary | ICD-10-CM | POA: Diagnosis not present

## 2019-01-21 DIAGNOSIS — G4733 Obstructive sleep apnea (adult) (pediatric): Secondary | ICD-10-CM

## 2019-01-21 DIAGNOSIS — I350 Nonrheumatic aortic (valve) stenosis: Secondary | ICD-10-CM

## 2019-01-21 NOTE — Progress Notes (Signed)
Virtual Visit via Video Note   This visit type was conducted due to national recommendations for restrictions regarding the COVID-19 Pandemic (e.g. social distancing) in an effort to limit this patient's exposure and mitigate transmission in our community.  Due to her co-morbid illnesses, this patient is at least at moderate risk for complications without adequate follow up.  This format is felt to be most appropriate for this patient at this time.  All issues noted in this document were discussed and addressed.  A limited physical exam was performed with this format.  Please refer to the patient's chart for her consent to telehealth for [facility].   Date:  01/21/2019   ID:  [name], DOB 11-Dec-1955, MRN [id number]  Patient Location: Home Provider Location: Home  PCP:  [name], MD  Cardiologist:  Dr. Desma Mcgregor, MD (sleep) Electrophysiologist:  None   Evaluation Performed:  Initial sleep evaluation  Chief Complaint:  OSA  History of Present Illness:    [name] is a 63 y.o. female who is followed by Dr. Shirlee More for cardiology care.  History of previous severe symptomatic aortic stenosis with heart failure and underwent successful aortic valve replacement with a 23 mm Hancock aortic valve by [facility] in July 2004.  She also has a history of hypertension, and has been demonstrated to have LVOT obstruction with normal functioning aortic valve replacement on TEE.  Due to concerns for obstructive sleep apnea, she underwent an initial sleep study on September 15, 2018.  This confirmed very severe sleep apnea with an AHI of 120.7/h, and RDI of 131.5/h.  During REM sleep, AHI was 147.3/h and she had very severe oxygen desaturation to a nadir of 59%.  On 09/21/2018 a CPAP/BiPAP titration was performed and due to significant      residual sleep apnea at 20 cm CPAP pressure she was transitioned to BiPAP which was implemented at 22/18 and was  titrated to 27/22.  Initial trial of BiPAP auto SV was felt to be necessary since BiPAP could only be titrated to 25 cm and autoSV up to 30 cm.   However, it it appeared that the patient's primary care physician ordered an initial CPAP auto unit which the patient received prior to being notified by our office of the need for BiPAP auto.  [employer] in [place] is the DME company and apparently a download from September 27, 2018 through October 26, 2018 with the CPAP therapy was done.  In that timeframe she had used the device 80% of the time but she was not compliant with usage per day.  Apparently when she was contacted by our office, she ultimately received a new machine.  We try to download her where auto BiPAP unit but apparently this was never linked by her DME company and as a result data was not able to be obtained from her current BiPAP unit.  During the review, I had to interrupt the meeting to try to obtain the information from her new machine and had contacted our nurse and sleep coordinator to see if this could be obtained.  I then called the patient back to continue the telemedicine visit.  We were able to have them get into her machine at home provide some information.  The patient states that she has been using the BiPAP 100% of the days.  However she has very poor sleep and typically does not sleep more than  2 hours at a time.  She goes to bed anywhere between 11:30 at night to 3 AM usually sleeps for couple of hours duration and then may go the kitchen and clean the refrigerator or do other different things before going back to bed.  It appears that a 7-day average is 1.4/h and 30-day usage is only 2.1/h.  AHI is 7.8 with a 7-day average at 3.4.  Patient admits to being tired during the day.  She does feel somewhat better when she initially falls to sleep with the BiPAP unit but awakens in 2 hours and does not immediately try to go back to sleep.  She has very poor sleep hygiene.   She presents for initial evaluation.   The patient does not have symptoms concerning for COVID-19 infection (fever, chills, cough, or new shortness of breath).    Past Medical History:  Diagnosis Date   Aortic stenosis 03/23/2013   Overview:  02/18/13 TTE EF >55%. Critical AS with mean Ao valve gradient of 82 mm Hg. No AI. No MR, PR, mild TR. Estimated RVSP 30 mm Hg.   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   metoprolol tartrate (LOPRESSOR) 50 MG tablet Take 1 tablet (50 mg total) by mouth 2 (two) times daily.   norethindrone (AYGESTIN) 5 MG tablet Take 5 mg by mouth 2 (two) times daily.    pravastatin (PRAVACHOL) 20 MG tablet Take 20 mg by mouth at bedtime.    torsemide (DEMADEX) 20 MG tablet Take 1 tablet 20 mg by mouth 2x's daily. Take 1 additional tablet daily if weight is 288 lbs or greater.     Allergies:   Patient has no known allergies.   Social History   Tobacco Use   Smoking status: Never Smoker   Smokeless tobacco: Never Used  Substance Use Topics   Alcohol use: No    Frequency: Never   Drug use: No     Family Hx: The patient's family history includes Heart attack in her mother; Parkinson's disease in her father.  ROS:   Please see the history of present illness.    Morbidly obese No recent chest pain shortness of breath with activity  No anginal type symptoms No rashes Occasional  leg swelling Positive for snoring, frequent awakenings, daytime sleepiness, Bruxism, hypnagogic hallucinations or cataplectic events. All other systems reviewed and are negative.   Prior CV studies:   The following studies were reviewed today:  Date of NPSG: 09/15/2018: AHI 120.7/h; RDI 131.5/h: REM sleep AHI 147.3/h; very severe O2 desaturation to 59%.   BIPAP TITRATION IMPRESSIONS - CPAP was initiated at 7 cm and was titrated to 20 cm; AHI at 20 cm was 47.6/h. BiPAP was implemented at 22/18 and was titrated to 27/22; AHI at 27/22 was 6.4/h with O2 nadir at 89% without REM sleep at this pressure. - Rare central sleep apnea was noted during this titration (  CAI  0.3/h). - Severe oxygen desaturations to a nadir of 64% at 7 cwp. - The patient snored with moderate snoring volume. - No cardiac abnormalities were observed during this study. - Clinically significant periodic limb movements were not noted during this study. Arousals associated with PLMs were rare.  DIAGNOSIS - Obstructive Sleep Apnea (327.23 [G47.33 ICD-10])  RECOMMENDATIONS - Recommend an initial trial of BiPAP Auto therapy with an EPAP min of 22, PS of 5 and IPAP max of 30 cm H2O with heated humidification. BiPAP Auto SV may be necessary if BiPAP alone unable to be titrated to 30 cm.  A Medium size Fisher&Paykel Full Face Mask Simplus mask was used for the titration study. - Effort should be made to optimize nasal and orophayngeal patency. - Avoid alcohol, sedatives and other CNS depressants that may worsen sleep apnea and disrupt normal sleep architecture. - Sleep hygiene should be reviewed to assess factors that may improve sleep quality. - Weight management and regular exercise should be initiated or continued. - Recommend a download in 14 and 30 days and sleep clinic evaluation after 4 weeks of therapy.  [Electronically signed] 10/11/2018 11:09 AM   Labs/Other Tests and Data Reviewed:    EKG:  An ECG dated  04/22/2018 was personally reviewed today and demonstrated:  Sinus rhythm at 96 bpm, LVH with repolarization changes.  Recent Labs: 07/10/2018: Hemoglobin 9.1; Platelets 386 08/14/2018: BUN 23; Creatinine, Ser 1.51; NT-Pro BNP 876; Potassium 4.1; Sodium 139   Recent Lipid Panel No results found for: CHOL, TRIG, HDL, CHOLHDL, LDLCALC, LDLDIRECT  Wt Readings from Last 3 Encounters:  01/21/19 295 lb (133.8 kg)  12/22/18 288 lb (130.6 kg)  09/25/18 292 lb 12.8 oz (132.8 kg)     Objective:    Vital Signs:  BP 132/71    Pulse 78    Ht 5\' 2"  (1.575 m)    Wt 295 lb (133.8 kg)    BMI 53.96 kg/m    Super morbid obesity No acute distress Respirations normal and unlabored HEENT mostly unremarkable; could not adequately visualize her oropharynx Thick Neck Central adiposity No wheezing  She denies any rash Neurologically intact Normal cognition and mood   ASSESSMENT & PLAN:    1. Severe obstructive sleep apnea: Considerable time with the patient discussing her sleep study which is severely abnormal.  Overall AHI is 120.7/h and indicates that she is stopping breathing every 30 seconds.  During REM sleep AHI was 147/h.  She had very severe oxygen desaturation to a nadir of 59%.  I had a long discussion with the patient regarding normal sleep architecture and the effects of sleep apnea on this.  We discussed the adverse cardiovascular consequences [place] regarding the severity of her sleep apnea both with reference to effects on hypertension, atrial fibrillation development, additional nocturnal arrhythmias, potential nocturnal ischemia or stroke.  She has extremely poor sleep hygiene and apparently never sleeps for longer than 2 hours duration at a time in bed.  Her initial download when she was inadvertently given a CPAP machine by her primary care doctor instead of having the machine prescribed following her sleep study indicates inadequate use duration.  She apparently is now received a new dream  station auto BiPAP which may be an ASV device but unfortunately we were unable to download his data since her [employer] D[employer] company never linked her new machine which did not allow US obtain information.  I spent over 50 minutes during this telemedicine conference cussing with the  patient as well as with attempts to obtain her information and then calling the patient back to get some objective data.  I was able to get some information as noted above from her machine which her husband read off and it appears that her usage continues to be under 4 hours consistently.  I discussed the importance of meeting compliance standards and not just only using the CPAP for 4 hours duration but the fact that an ideal sleep duration is 8 hours and she should use CPAP for the entire duration of sleep.  We discussed many aspects of potentially improving sleep hygiene.  We will try to again recontact [employer] company so that they can link her new BiPAP machine so that we can attain this data and additional adjustments can be made to her auto therapy and settings.  I have recommended a follow-up office or telemedicine assessment in 4 weeks and hopefully adjustments to her machine will be made to improve her compliance. 2. Super morbid obesity: I discussed the importance of weight loss and exercise.  We discussed the benefit that this can provide both for cardiovascular health as well as her sleep apnea. 3. History of aortic valve replacement: 2014 by Dr[facility]Kon at [facility]. 4. LVOT gradient noted on TEE 07/01/2018 with a peak resting gradient of 52 mm.  COVID-19 Education: The signs and symptoms of COVID-19 were discussed with the patient and how to seek care for testing (follow up with PCP or arrange E-visit).  The importance of social distancing was discussed today.  Time:   Today, I have spent 50 minutes with the patient with telehealth technology discussing the above problems.     Medication  Adjustments/Labs and Tests Ordered: Current medicines are reviewed at length with the patient today.  Concerns regarding medicines are outlined above.   Tests Ordered: No orders of the defined types were placed in this encounter.   Medication Changes: No orders of the defined types were placed in this encounter.   Disposition:  Follow up  4 weeks  Signed, Shelva Majestic, MD  01/21/2019 11:50 AM    [place]

## 2019-01-21 NOTE — Patient Instructions (Addendum)
Medication Instructions:  The current medical regimen is effective;  continue present plan and medications.  If you need a refill on your cardiac medications before your next appointment, please call your pharmacy.   Follow-Up: At [facility], you and your health needs are our priority.  As part of our continuing mission to provide you with exceptional heart care, we have created designated Provider Care Teams.  These Care Teams include your primary Cardiologist (physician) and Advanced Practice Providers (APPs -  Physician Assistants and Nurse Practitioners) who all work together to provide you with the care you need, when you need it. You will need a follow up appointment in 1 months.  Please call our office 2 months in advance to schedule this appointment.  You may see Dr.Kelly (sleep) or one of the following Advanced Practice Providers on your designated Care Team: Almyra Deforest, [place] . Fabian Sharp, PA-C

## 2019-01-26 ENCOUNTER — Telehealth: Payer: Self-pay | Admitting: *Deleted
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
## 2019-01-26 NOTE — Telephone Encounter (Signed)
acknowledged

## 2019-02-04 ENCOUNTER — Telehealth: Payer: Self-pay | Admitting: Cardiology

## 2019-02-04 DIAGNOSIS — I110 Hypertensive heart disease with heart failure: Secondary | ICD-10-CM

## 2019-02-04 NOTE — Telephone Encounter (Signed)
Is scheduled to see [place] in July but wants a sooner appt due to her leg wounds and fluid oozing out of her legs

## 2019-02-04 NOTE — Telephone Encounter (Signed)
Returned patients call regarding LE edema/oozing/scheduling sooner visit with Dr. Bettina Gavia. No answer, left voice message with callback number.

## 2019-02-05 NOTE — Telephone Encounter (Signed)
Left message for patient to return call to discuss. Clarified with Dr. Bettina Gavia that patient should increase torsemide to 40 mg twice daily.

## 2019-02-05 NOTE — Telephone Encounter (Signed)
Patient reports that she was discharged from the [facility] about a month ago because her legs were almost healed completely. Two weeks later, the wounds started to worsen. Patient is now getting care at the [facility] again and is concerned because she is having a great amount of fluid weeping from both legs. Patient had her bandages changed on Wednesday and had to go back to have them re-dressed yesterday due to the bandages being saturated/soiled. The wound care doctor obtained a wound culture that is still pending. Patient thinks infection is part of the problem. She is concerned as she is taking torsemide 20 mg three times daily due to her weight being in the 290's. Today, she weighed 292, no BP/HR readings available. Denies any further symptoms. Will have Dr. Bettina Gavia advise.

## 2019-02-05 NOTE — Telephone Encounter (Signed)
Increase her torsemide to 20 mg BID  Does she have metolazone at home/  If not needs a prescription for 2.5 mg and take daily until weight < 285  Monday or Tuesday BMP and pro BNP

## 2019-02-05 NOTE — Telephone Encounter (Signed)
Returned call

## 2019-02-08 MED ORDER — METOLAZONE 2.5 MG PO TABS: ORAL_TABLET | ORAL | 0 refills | Status: DC

## 2019-02-08 NOTE — Telephone Encounter (Addendum)
Phoned patient, states she has lost 7 pounds in the last 2 days and her current weight is 284#.  Pt is already takiing torsemide 20 mg BID and an additional 20 mg prn for weight greater than 288.  Her weight today is 284. She thinks she is losing all the weight from fluid oozing from her left lower leg secondary to lymphedema. She is being treated by a wound care clinic.  Dr. Bettina Gavia would like to have her take metolazone 2.5 mg prn daily prn weight greater than 285#. Rx being sent to [facility] in [place].  Pt will come to [place] clinic to have BMP and pro BNP drawn tomorrow or Wednesday per Dr. Joya Gaskins order.  She verbalizes understanding of the above and denies further questions or concerns.

## 2019-02-08 NOTE — Telephone Encounter (Signed)
Left voice message requesting return call to [phone]

## 2019-02-08 NOTE — Addendum Note (Signed)
Addended by: Polly Cobia A on: 02/08/2019 05:23 PM   Modules accepted: Orders

## 2019-02-09 MED ORDER — TORSEMIDE 20 MG PO TABS: ORAL_TABLET | ORAL | 2 refills | Status: DC

## 2019-02-09 NOTE — Telephone Encounter (Signed)
Torsemide 20 BID and extra tab if weight >/= 280

## 2019-02-09 NOTE — Telephone Encounter (Signed)
Phoned patient, informed that [place] would like her to take the extra dose of torsemide for weight greater than #280. She states her weight this morning is 284, told to take the extra dose today. She is having labs drawn tomorrow in [facility]. Rx instructions updated in [facility]

## 2019-02-09 NOTE — Addendum Note (Signed)
Addended by: Polly Cobia A on: 02/09/2019 03:55 PM   Modules accepted: Orders

## 2019-02-12 ENCOUNTER — Telehealth: Payer: Self-pay | Admitting: Emergency Medicine

## 2019-02-12 DIAGNOSIS — R600 Localized edema: Secondary | ICD-10-CM

## 2019-02-12 DIAGNOSIS — I350 Nonrheumatic aortic (valve) stenosis: Secondary | ICD-10-CM

## 2019-02-12 DIAGNOSIS — I110 Hypertensive heart disease with heart failure: Secondary | ICD-10-CM

## 2019-02-12 DIAGNOSIS — Z954 Presence of other heart-valve replacement: Secondary | ICD-10-CM

## 2019-02-12 NOTE — Telephone Encounter (Signed)
Patient in office for lab work. The orders are not matching up. New labs ordered. CBC, CMP, Lipid, BNP

## 2019-02-13 LAB — COMPREHENSIVE METABOLIC PANEL
ALT: 19 IU/L (ref 0–32)
AST: 27 IU/L (ref 0–40)
Albumin/Globulin Ratio: 1.1 — ABNORMAL LOW (ref 1.2–2.2)
Albumin: 3.7 g/dL — ABNORMAL LOW (ref 3.8–4.8)
Alkaline Phosphatase: 46 IU/L (ref 39–117)
BUN/Creatinine Ratio: 14 (ref 12–28)
BUN: 20 mg/dL (ref 8–27)
Bilirubin Total: 0.3 mg/dL (ref 0.0–1.2)
CO2: 24 mmol/L (ref 20–29)
Calcium: 9.4 mg/dL (ref 8.7–10.3)
Chloride: 96 mmol/L (ref 96–106)
Creatinine, Ser: 1.41 mg/dL — ABNORMAL HIGH (ref 0.57–1.00)
GFR calc Af Amer: 45 mL/min/{1.73_m2} — ABNORMAL LOW (ref >59)
GFR calc non Af Amer: 39 mL/min/{1.73_m2} — ABNORMAL LOW (ref >59)
Globulin, Total: 3.5 g/dL (ref 1.5–4.5)
Glucose: 115 mg/dL — ABNORMAL HIGH (ref 65–99)
Potassium: 4 mmol/L (ref 3.5–5.2)
Sodium: 136 mmol/L (ref 134–144)
Total Protein: 7.2 g/dL (ref 6.0–8.5)

## 2019-02-13 LAB — CBC
Hematocrit: 32 % — ABNORMAL LOW (ref 34.0–46.6)
Hemoglobin: 9.5 g/dL — ABNORMAL LOW (ref 11.1–15.9)
MCH: 22.6 pg — ABNORMAL LOW (ref 26.6–33.0)
MCHC: 29.7 g/dL — ABNORMAL LOW (ref 31.5–35.7)
MCV: 76 fL — ABNORMAL LOW (ref 79–97)
Platelets: 393 10*3/uL (ref 150–450)
RBC: 4.21 x10E6/uL (ref 3.77–5.28)
RDW: 18.2 % — ABNORMAL HIGH (ref 11.7–15.4)
WBC: 15.5 10*3/uL — ABNORMAL HIGH (ref 3.4–10.8)

## 2019-02-13 LAB — LIPID PANEL
Chol/HDL Ratio: 3.4 ratio (ref 0.0–4.4)
Cholesterol, Total: 125 mg/dL (ref 100–199)
HDL: 37 mg/dL — ABNORMAL LOW (ref >39)
LDL Calculated: 64 mg/dL (ref 0–99)
Triglycerides: 118 mg/dL (ref 0–149)
VLDL Cholesterol Cal: 24 mg/dL (ref 5–40)

## 2019-02-13 LAB — PRO B NATRIURETIC PEPTIDE: NT-Pro BNP: 640 pg/mL — ABNORMAL HIGH (ref 0–287)

## 2019-02-15 ENCOUNTER — Telehealth: Payer: Self-pay | Admitting: *Deleted

## 2019-02-15 NOTE — Telephone Encounter (Signed)
-----   Message from Richardo Priest, MD sent at 02/13/2019  7:15 AM EDT ----- Good result except CBC  Any sign of infection?  Recheck CBC bmp on Thursday

## 2019-02-15 NOTE — Telephone Encounter (Signed)
Telephone call to patient. Left message to call back to discuss results

## 2019-02-16 ENCOUNTER — Telehealth: Payer: Self-pay | Admitting: *Deleted

## 2019-02-16 NOTE — Telephone Encounter (Signed)
-----   Message from Richardo Priest, MD sent at 02/13/2019  7:15 AM EDT ----- Good result except CBC  Any sign of infection?  Recheck CBC bmp on Thursday

## 2019-02-16 NOTE — Telephone Encounter (Signed)
Patient called back . Informed of lab  Results. States has an infection in her leg that she goes to the wound center for. Informed to repeat BMP and CBC on Thursday. Patient verbalized understanding.

## 2019-03-08 NOTE — Telephone Encounter (Signed)
Opened in error

## 2019-03-21 ENCOUNTER — Other Ambulatory Visit: Payer: Self-pay | Admitting: Cardiology

## 2019-03-21 NOTE — Progress Notes (Signed)
Cardiology Office Note:    She initially was set up for virtual visit when she contacted me she was about a quarter of a mile from our office and I told her I thought it was best to come here and physically be seen.  Date:  03/23/2019   ID:  [name], DOB [birth year], MRN [id number]  PCP:  Martha Sheriff, MD  Cardiologist:  Martha More, MD    Referring MD: Martha Sheriff, MD    ASSESSMENT:    1. Hypertensive heart disease with heart failure ([place])   2. H/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   3. Obstructive hypertrophic cardiomyopathy ([place])   4. OSA (obstructive sleep apnea)   5. Other iron deficiency anemia   6. Mixed hyperlipidemia   7. Bilateral lower extremity edema   8. Nonhealing ulcer of left lower leg, unspecified ulcer stage ([place])    PLAN:    In order of problems listed above:  1. Hypertensive heart disease with heart failure -   Blood pressures: BP elevated today 166/89. Reports home BP in the 160s-170s/90s. Will initiate Telmisartan 20mg  daily. BMP today and repeat at follow up in 6 weeks for monitoring of kidney function.   Heart failure: Concern for mild decompensation as weights are labile in the 180s. Will initiate Metolazone 2.5 once per week. BMP, ProBNP today. Continue daily weights. TTE 06/11/18 with EF 65-70% and grade 2 diastolic dysfunction.  2. H/O AVR with tissue graft - 2014 by [name] at [facility]. Stable. TEE in the last 6 months with normal valve function.  3. Obstructive hypertrophic cardiomyopathy - Continues to report shortness of breath. Etiology LVOT cardiomyopathy, uncontrolled HTN, obesity, HF, chronic anemia. Better control of BP and fluid status as above. Consider repeat echocardiogram at office visit in 6 weeks.  4. OSA - Follows with [name]. Encouraged to wear her BiPAP nightly. Encouraged to establish a night time routine to promote better sleep.  5. Iron deficiency anemia -  Likely due to chronic bleeding in the  setting of uterine bleeding. D&C has been deferred due to [place]. Recheck CBC today to rule out as etiology of continued SOB.  6. HLD - Lipid panel 02/12/19 with total cholesterol 125, LDL 64. Continue statin.  7. Bilateral LE edema - Non pitting on exam today. Educated to restrict sodium and fluids. Educated to elevate lower extremities when sitting.  8. Nonhealing ulcer of left lower leg - Follows with [place] [facility]. Dressing was changed today. Will refer to Dr. Scot [name] of [facility]  for evaluation secondary to nonhealing wound for 1 year despite intervention of [place].    Next appointment: 6 weeks   Medication Adjustments/Labs and Tests Ordered: Current medicines are reviewed at length with the patient today.  Concerns regarding medicines are outlined above.  Orders Placed This Encounter  Procedures  . Basic Metabolic Panel (BMET)  . CBC  . Pro b natriuretic peptide (BNP)  . Ambulatory referral to Vascular Surgery   Meds ordered this encounter  Medications  . telmisartan (MICARDIS) 20 MG tablet    Sig: Take 1 tablet (20 mg total) by mouth daily.    Dispense:  30 tablet    Refill:  3  . metolazone (ZAROXOLYN) 2.5 MG tablet    Sig: Take 1 tablet (2.5 mg total) by mouth once a week.    Dispense:  4 tablet    Refill:  3    Chief  Complaint: 63 yo female presents for follow up of her obstructive hypertrophic cardiomyopathy and heart failure.   History of Present Illness:    [name] is a 63 y.o. female with a hx of  severe symptomatic aortic stenosis with heart failure and underwent 23 mm [place] SAVR on 03/30/13 Dr Clementeen [name] at [facility] , hypertension and heart failure. Recent TEE showed dynamic LVOT obstruction and normal AVR function. She was  diagnosed with  severe obstructive and central sleep apnea with nocturnal hypoxemia nadir 59% and initiated on CPAP which was changed to BiPAP.  Her other problems include nonhealing leg ulcers and dysfunctional uterine  bleeding pending D&C.  She was last seen 12/22/2018 virtual visit for heart failure. Initially seen today via video visit - I noticed she was in her car and asked where she was going. She had just been seen by the [facility] down the road from this office and was agreeable to an in office visit.   Reports her weights are labile in the 180s. She does weigh herself daily and monitors her sodium intake. We will initiate Metolazone 2.5mg  once per week to maintain control of her fluid volume and weight.   Reports her home blood pressure readings are consistently elevated. Denies hypotension. Home readings in the 053Z-767H systolic, this AM 419/37. No headache, dizziness. Will plan to optimize medical therapy.  Reports dyspnea with exertion continues. No new onset shortness of breath. Can do approximately 20 minutes of activity prior to needing to rest.   Reports she "doesn't sleep" follows with [name] for her OSA. Utilizing BiPAP at night. Says she sleeps a few hours and them wakes up, but does not go back to sleep immediately. Endorses feeling [name] rested when she uses the BiPAP. Does not take naps during the day.   Compliance with diet, lifestyle and medications: Yes Past Medical History:  Diagnosis Date  . Aortic stenosis 03/23/2013   Overview:  02/18/13 TTE EF >55%. Critical AS with mean Ao valve gradient of 82 mm Hg. No AI. No MR, PR, mild TR. Estimated RVSP 30 mm Hg.  . Bicuspid aortic valve   . Edema of both legs 02/20/2017  . 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 Heart failure ([place])   . HTN (hypertension) 03/23/2013  . 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 Morbid obesity ([place]) 02/20/2017    Past Surgical History:  Procedure Laterality Date  . TEE WITHOUT CARDIOVERSION N/A 07/01/2018   Procedure: TRANSESOPHAGEAL ECHOCARDIOGRAM (TEE);  Surgeon: Martha Dresser, MD;  Location: [facility] ENDOSCOPY;  Service: Cardiovascular;  Laterality: N/A;  . TISSUE AORTIC VALVE  REPLACEMENT      Current Medications: Current Meds  Medication Sig  . aspirin EC 81 MG tablet Take 81 mg by mouth daily.  . metoprolol tartrate (LOPRESSOR) 50 MG tablet Take 1 tablet by mouth twice daily  . norethindrone (AYGESTIN) 5 MG tablet Take 5 mg by mouth 2 (two) times daily.   . pravastatin (PRAVACHOL) 20 MG tablet Take 20 mg by mouth at bedtime.   . torsemide (DEMADEX) 20 MG tablet Take 1 tablet (20 mg)  by mouth 2x's daily. Take 1 additional tablet daily for if weight is 280 lbs or greater.     Allergies:   Patient has no known allergies.   Social History   Socioeconomic History  . Marital status: Married    Spouse name: Not on file  . Number of children: Not on file  . Years of education: Not  on file  . Highest education level: Not on file  Occupational History  . Not on file  Social Needs  . Financial resource strain: Not on file  . Food insecurity    Worry: Not on file    Inability: Not on file  . Transportation needs    Medical: Not on file    Non-medical: Not on file  Tobacco Use  . Smoking status: Never Smoker  . Smokeless tobacco: Never Used  Substance and Sexual Activity  . Alcohol use: No    Frequency: Never  . Drug use: No  . Sexual activity: Not on file  Lifestyle  . Physical activity    Days per week: Not on file    Minutes per session: Not on file  . Stress: Not on file  Relationships  . Social Herbalist on phone: Not on file    Gets together: Not on file    Attends religious service: Not on file    Active member of club or organization: Not on file    Attends meetings of clubs or organizations: Not on file    Relationship status: Not on file  Other Topics Concern  . Not on file  Social History Narrative  . Not on file     Family History: The patient's family history includes Heart attack in her mother; Parkinson's disease in her father. ROS:   Please see the history of present illness.    Review of Systems   Constitution: Negative for chills, fever and malaise/fatigue.  Cardiovascular: Positive for dyspnea on exertion and leg swelling. Negative for chest pain, palpitations, paroxysmal nocturnal dyspnea and syncope.  Respiratory: Negative for cough and wheezing.        Shortness of breath with exertion - not at rest  Skin: Positive for poor wound healing (LLE wound followed by [facility]).  Gastrointestinal: Negative for nausea and vomiting.  Neurological: Negative for dizziness, light-headedness and weakness.  Psychiatric/Behavioral: The patient has insomnia.     All other systems reviewed and are negative.  EKGs/Labs/Other Studies Reviewed:    The following studies were reviewed today:  EKG:  No EKG today.   Recent Labs: 02/12/2019: ALT 19; BUN 20; Creatinine, Ser 1.41; Hemoglobin 9.5; NT-Pro BNP 640; Platelets 393; Potassium 4.0; Sodium 136  Recent Lipid Panel    Component Value Date/Time   CHOL 125 02/12/2019 1116   TRIG 118 02/12/2019 1116   HDL 37 (L) 02/12/2019 1116   CHOLHDL 3.4 02/12/2019 1116   LDLCALC 64 02/12/2019 1116    Physical Exam:    VS:  BP (!) 166/89 (BP Location: Left Arm, Patient Position: Sitting)   Pulse 88   Ht 5\' 2"  (1.575 m)   Wt 288 lb (130.6 kg)   SpO2 94%   BMI 52.68 kg/m     Wt Readings from Last 3 Encounters:  03/23/19 288 lb (130.6 kg)  01/21/19 295 lb (133.8 kg)  12/22/18 288 lb (130.6 kg)     GEN: Well nourished, well developed in no acute distress, overweight HEENT: Normal NECK: No JVD; No carotid bruits LYMPHATICS: No lymphadenopathy CARDIAC: RRR, grade II/VI holosystolic murmur, rubs, gallops RESPIRATORY:  Clear to auscultation without rales, wheezing or rhonchi  ABDOMEN: Soft, non-tender, non-distended MUSCULOSKELETAL:  RLE and LLE with non-pitting edema; No deformity  SKIN: Warm and dry. Unna-boot like wrap to LLE placed by wound care center today. Multiple small abrasian on bilateral lower extremity with intact scab, no  weeping, no exudate  NEUROLOGIC:  Alert and oriented x 3 PSYCHIATRIC:  Normal affect    Signed, Martha More, MD  03/23/2019 12:20 PM    [name]

## 2019-03-23 ENCOUNTER — Encounter: Payer: Self-pay | Admitting: Cardiology

## 2019-03-23 ENCOUNTER — Telehealth (INDEPENDENT_AMBULATORY_CARE_PROVIDER_SITE_OTHER): Payer: BC Managed Care – PPO | Admitting: Cardiology

## 2019-03-23 ENCOUNTER — Other Ambulatory Visit: Payer: Self-pay

## 2019-03-23 VITALS — BP 166/89 | HR 88 | Ht 62.0 in | Wt 288.0 lb

## 2019-03-23 DIAGNOSIS — I110 Hypertensive heart disease with heart failure: Secondary | ICD-10-CM

## 2019-03-23 DIAGNOSIS — G4733 Obstructive sleep apnea (adult) (pediatric): Secondary | ICD-10-CM

## 2019-03-23 DIAGNOSIS — E782 Mixed hyperlipidemia: Secondary | ICD-10-CM

## 2019-03-23 DIAGNOSIS — L97929 Non-pressure chronic ulcer of unspecified part of left lower leg with unspecified severity: Secondary | ICD-10-CM

## 2019-03-23 DIAGNOSIS — Z954 Presence of other heart-valve replacement: Secondary | ICD-10-CM

## 2019-03-23 DIAGNOSIS — I421 Obstructive hypertrophic cardiomyopathy: Secondary | ICD-10-CM | POA: Diagnosis not present

## 2019-03-23 DIAGNOSIS — D508 Other iron deficiency anemias: Secondary | ICD-10-CM

## 2019-03-23 DIAGNOSIS — R600 Localized edema: Secondary | ICD-10-CM

## 2019-03-23 MED ORDER — TELMISARTAN 20 MG PO TABS: 20.0000 mg | ORAL_TABLET | Freq: Every day | ORAL | 3 refills | Status: DC

## 2019-03-23 MED ORDER — METOLAZONE 2.5 MG PO TABS: 2.5000 mg | ORAL_TABLET | ORAL | 3 refills | Status: DC

## 2019-03-23 NOTE — Patient Instructions (Signed)
Medication Instructions:  Your physician has recommended you make the following change in your medication:   START telmisartan (micardis) 20 mg: Take 1 tablet daily  START metolazone (zaroxolyn) 2.5 mg: Take 1 tablet once a week  If you need a refill on your cardiac medications before your next appointment, please call your pharmacy.   Lab work: Your physician recommends that you return for lab work today: CBC, BMP, ProBNP.   If you have labs (blood work) drawn today and your tests are completely normal, you will receive your results only by: Marland Kitchen MyChart Message (if you have MyChart) OR . A paper copy in the mail If you have any lab test that is abnormal or we need to change your treatment, we will call you to review the results.  Testing/Procedures: You have been referred to see a vascular surgeon, Dr. Scot Dock, due to your non-healing left leg wound. You will be contacted to schedule this appointment.   Follow-Up: At [facility], you and your health needs are our priority.  As part of our continuing mission to provide you with exceptional heart care, we have created designated Provider Care Teams.  These Care Teams include your primary Cardiologist (physician) and Advanced Practice Providers (APPs -  Physician Assistants and Nurse Practitioners) who all work together to provide you with the care you need, when you need it. You will need a follow up appointment in 6 weeks.      Telmisartan tablets What is this medicine? TELMISARTAN (tel mi SAR tan) is used to treat high blood pressure. This medicine may be used for other purposes; ask your health care provider or pharmacist if you have questions. COMMON BRAND NAME(S): Micardis What should I tell my health care provider before I take this medicine? They need to know if you have any of these conditions:  if you are on a special diet, such as a low-salt diet  kidney or liver disease  an unusual or allergic reaction to telmisartan,  other medicines, foods, dyes, or preservatives  pregnant or trying to get pregnant  breast-feeding How should I use this medicine? Take this medicine by mouth with a glass of water. Follow the directions on the prescription label. This medicine can be taken with or without food. Take your doses at regular intervals. Do not take your medicine more often than directed. Talk to your pediatrician regarding the use of this medicine in children. Special care may be needed. Overdosage: If you think you have taken too much of this medicine contact a poison control center or emergency room at once. NOTE: This medicine is only for you. Do not share this medicine with others. What if I miss a dose? If you miss a dose, take it as soon as you can. If it is almost time for your next dose, take only that dose. Do not take double or extra doses. What may interact with this medicine?  digoxin  potassium salts or potassium supplements  warfarin This list may not describe all possible interactions. Give your health care provider a list of all the medicines, herbs, non-prescription drugs, or dietary supplements you use. Also tell them if you smoke, drink alcohol, or use illegal drugs. Some items may interact with your medicine. What should I watch for while using this medicine? Visit your doctor or health care professional for regular checks on your progress. Check your blood pressure as directed. Ask your doctor or health care professional what your blood pressure should be and when  you should contact him or her. Call your doctor or health care professional if you notice an irregular or fast heart beat. Women should inform their doctor if they wish to become pregnant or think they might be pregnant. There is a potential for serious side effects to an unborn child, particularly in the second or third trimester. Talk to your health care professional or pharmacist for more information. You may get drowsy or dizzy.  Do not drive, use machinery, or do anything that needs mental alertness until you know how this drug affects you. Do not stand or sit up quickly, especially if you are an older patient. This reduces the risk of dizzy or fainting spells. Alcohol can make you more drowsy and dizzy. Avoid alcoholic drinks. Avoid salt substitutes unless you are told otherwise by your doctor or health care professional. Do not treat yourself for coughs, colds, or pain while you are taking this medicine without asking your doctor or health care professional for advice. Some ingredients may increase your blood pressure. What side effects may I notice from receiving this medicine? Side effects that you should report to your doctor or health care professional as soon as possible:  allergic reactions like skin rash, itching or hives, swelling of the face, lips, or tongue  breathing problems  dark urine  gout pain  muscle pains  slow heartbeat  trouble passing urine or change in the amount of urine  unusual bleeding or bruising  yellowing of the eyes or skin Side effects that usually do not require medical attention (report to your doctor or health care professional if they continue or are bothersome):  back pain  change in sex drive or performance  diarrhea  sore throat or stuffy nose This list may not describe all possible side effects. Call your doctor for medical advice about side effects. You may report side effects to FDA at 1-800-FDA-1088. Where should I keep my medicine? Keep out of the reach of children. Store at room temperature between 15 and 30 degrees C (59 and 86 degrees F). Tablets should not be removed from the blisters until right before use. Throw away any unused medicine after the expiration date. NOTE: This sheet is a summary. It may not cover all possible information. If you have questions about this medicine, talk to your doctor, pharmacist, or health care provider.  2020  Elsevier/Gold Standard (2007-11-11 13:39:10)    Metolazone tablets What is this medicine? METOLAZONE (me TOLE a zone) is a diuretic. It increases the amount of urine passed, which causes the body to lose salt and water. This medicine is used to treat high blood pressure. It is also reduces the swelling and water retention caused by heart or kidney disease. This medicine may be used for other purposes; ask your health care provider or pharmacist if you have questions. COMMON BRAND NAME(S): Mykrox, Zaroxolyn What should I tell my health care provider before I take this medicine? They need to know if you have any of these conditions:  diabetes  gout  immune system problems, like lupus  kidney disease  liver disease  pancreatitis  small amount of urine or difficulty passing urine  an unusual or allergic reaction to metolazone, sulfa drugs, other medicines, foods, dyes, or preservatives  pregnant or trying to get pregnant  breast-feeding How should I use this medicine? Take this medicine by mouth with a glass of water. Follow the directions on the prescription label. Remember that you will need to pass urine frequently  after taking this medicine. Do not take your doses at a time of day that will cause you problems. Do not take at bedtime. Take your medicine at regular intervals. Do not take your medicine more often than directed. Do not stop taking except on your doctor's advice. Talk to your pediatrician regarding the use of this medicine in children. Special care may be needed. Overdosage: If you think you have taken too much of this medicine contact a poison control center or emergency room at once. NOTE: This medicine is only for you. Do not share this medicine with others. What if I miss a dose? If you miss a dose, take it as soon as you can. If it is almost time for your next dose, take only that dose. Do not take double or extra doses. What may interact with this  medicine?  alcohol  antiinflammatory drugs for pain or swelling  barbiturates for sleep or seizure control  digoxin  dofetilide  lithium  medicines for blood sugar  medicines for high blood pressure  medicines that relax muscles for surgery  methenamine  other diuretics  some medicines for pain  steroid hormones like cortisone, hydrocortisone, and prednisone  warfarin This list may not describe all possible interactions. Give your health care provider a list of all the medicines, herbs, non-prescription drugs, or dietary supplements you use. Also tell them if you smoke, drink alcohol, or use illegal drugs. Some items may interact with your medicine. What should I watch for while using this medicine? Visit your doctor or health care professional for regular checks on your progress. Check your blood pressure as directed. Ask your doctor or health care professional what your blood pressure should be and when you should contact him or her. You may need to be on a special diet while taking this medicine. Ask your doctor. Check with your doctor or health care professional if you get an attack of severe diarrhea, nausea and vomiting, or if you sweat a lot. The loss of too much body fluid can make it dangerous for you to take this medicine. You may get drowsy or dizzy. Do not drive, use machinery, or do anything that needs mental alertness until you know how this medicine affects you. Do not stand or sit up quickly, especially if you are an older patient. This reduces the risk of dizzy or fainting spells. Alcohol may interfere with the effect of this medicine. Avoid alcoholic drinks. This medicine may affect your blood sugar level. If you have diabetes, check with your doctor or health care professional before changing the dose of your diabetic medicine. This medicine can make you more sensitive to the sun. Keep out of the sun. If you cannot avoid being in the sun, wear protective  clothing and use sunscreen. Do not use sun lamps or tanning beds/booths. What side effects may I notice from receiving this medicine? Side effects that you should report to your doctor or health care professional as soon as possible:  allergic reactions such as skin rash or itching, hives, swelling of the lips, mouth, tongue, or throat  fast or irregular heartbeat, chest pain  feeling faint  fever, chills  gout pain  hot red lump on leg  muscle pain, cramps  nausea, vomiting  numbness or tingling in hands, feet  pain or difficulty when passing urine  redness, blistering, peeling or loosening of the skin, including inside the mouth  unusual bleeding or bruising  unusually weak or tired  yellowing of  the eyes, skin Side effects that usually do not require medical attention (report to your doctor or health care professional if they continue or are bothersome):  abdominal pain  blurred vision  constipation or diarrhea  dry mouth  headache This list may not describe all possible side effects. Call your doctor for medical advice about side effects. You may report side effects to FDA at 1-800-FDA-1088. Where should I keep my medicine? Keep out of the reach of children. Store at room temperature between 15 and 30 degrees C (59 and 86 degrees F). Protect from light. Keep container tightly closed. Throw away any unused medicine after the expiration date. NOTE: This sheet is a summary. It may not cover all possible information. If you have questions about this medicine, talk to your doctor, pharmacist, or health care provider.  2020 Elsevier/Gold Standard (2008-03-14 14:11:48)

## 2019-03-30 ENCOUNTER — Encounter (HOSPITAL_COMMUNITY): Payer: Self-pay | Admitting: Emergency Medicine

## 2019-03-30 ENCOUNTER — Emergency Department (HOSPITAL_COMMUNITY): Payer: BC Managed Care – PPO

## 2019-03-30 ENCOUNTER — Inpatient Hospital Stay (HOSPITAL_COMMUNITY)
Admission: EM | Admit: 2019-03-30 | Discharge: 2019-04-02 | DRG: 287 | Disposition: A | Discharge status: Home / Self Care | Payer: BC Managed Care – PPO | Source: Ambulatory Visit | Attending: Internal Medicine | Admitting: Internal Medicine

## 2019-03-30 ENCOUNTER — Other Ambulatory Visit: Payer: Self-pay

## 2019-03-30 DIAGNOSIS — Z6841 Body Mass Index (BMI) 40.0 and over, adult: Secondary | ICD-10-CM

## 2019-03-30 DIAGNOSIS — I5032 Chronic diastolic (congestive) heart failure: Secondary | ICD-10-CM | POA: Diagnosis present

## 2019-03-30 DIAGNOSIS — N939 Abnormal uterine and vaginal bleeding, unspecified: Secondary | ICD-10-CM | POA: Diagnosis present

## 2019-03-30 DIAGNOSIS — I248 Other forms of acute ischemic heart disease: Secondary | ICD-10-CM | POA: Diagnosis not present

## 2019-03-30 DIAGNOSIS — N189 Chronic kidney disease, unspecified: Secondary | ICD-10-CM | POA: Diagnosis present

## 2019-03-30 DIAGNOSIS — I890 Lymphedema, not elsewhere classified: Secondary | ICD-10-CM | POA: Diagnosis present

## 2019-03-30 DIAGNOSIS — D259 Leiomyoma of uterus, unspecified: Secondary | ICD-10-CM | POA: Diagnosis present

## 2019-03-30 DIAGNOSIS — N920 Excessive and frequent menstruation with regular cycle: Secondary | ICD-10-CM | POA: Diagnosis present

## 2019-03-30 DIAGNOSIS — Z79890 Hormone replacement therapy (postmenopausal): Secondary | ICD-10-CM

## 2019-03-30 DIAGNOSIS — G4733 Obstructive sleep apnea (adult) (pediatric): Secondary | ICD-10-CM | POA: Diagnosis present

## 2019-03-30 DIAGNOSIS — I350 Nonrheumatic aortic (valve) stenosis: Secondary | ICD-10-CM | POA: Diagnosis present

## 2019-03-30 DIAGNOSIS — Z8249 Family history of ischemic heart disease and other diseases of the circulatory system: Secondary | ICD-10-CM

## 2019-03-30 DIAGNOSIS — R0600 Dyspnea, unspecified: Secondary | ICD-10-CM

## 2019-03-30 DIAGNOSIS — R079 Chest pain, unspecified: Secondary | ICD-10-CM | POA: Diagnosis present

## 2019-03-30 DIAGNOSIS — D649 Anemia, unspecified: Secondary | ICD-10-CM | POA: Diagnosis present

## 2019-03-30 DIAGNOSIS — I110 Hypertensive heart disease with heart failure: Secondary | ICD-10-CM | POA: Diagnosis present

## 2019-03-30 DIAGNOSIS — N938 Other specified abnormal uterine and vaginal bleeding: Secondary | ICD-10-CM | POA: Diagnosis present

## 2019-03-30 DIAGNOSIS — I249 Acute ischemic heart disease, unspecified: Secondary | ICD-10-CM

## 2019-03-30 DIAGNOSIS — Z1159 Encounter for screening for other viral diseases: Secondary | ICD-10-CM

## 2019-03-30 DIAGNOSIS — I130 Hypertensive heart and chronic kidney disease with heart failure and stage 1 through stage 4 chronic kidney disease, or unspecified chronic kidney disease: Secondary | ICD-10-CM | POA: Diagnosis present

## 2019-03-30 DIAGNOSIS — Z7982 Long term (current) use of aspirin: Secondary | ICD-10-CM

## 2019-03-30 DIAGNOSIS — Z952 Presence of prosthetic heart valve: Secondary | ICD-10-CM

## 2019-03-30 DIAGNOSIS — Z79899 Other long term (current) drug therapy: Secondary | ICD-10-CM

## 2019-03-30 HISTORY — DX: Sleep apnea, unspecified: G47.30

## 2019-03-30 LAB — CREATININE, SERUM
Creatinine, Ser: 1.17 mg/dL — ABNORMAL HIGH (ref 0.44–1.00)
GFR calc Af Amer: 57 mL/min — ABNORMAL LOW (ref >60)
GFR calc non Af Amer: 49 mL/min — ABNORMAL LOW (ref >60)

## 2019-03-30 LAB — TROPONIN I (HIGH SENSITIVITY)
Troponin I (High Sensitivity): 108 ng/L (ref <18)
Troponin I (High Sensitivity): 114 ng/L (ref <18)
Troponin I (High Sensitivity): 120 ng/L (ref <18)
Troponin I (High Sensitivity): 114 ng/L (ref <18)

## 2019-03-30 LAB — D-DIMER, QUANTITATIVE: D-Dimer, Quant: 1.36 ug/mL-FEU — ABNORMAL HIGH (ref 0.00–0.50)

## 2019-03-30 LAB — BASIC METABOLIC PANEL
Anion gap: 12 (ref 5–15)
BUN: 14 mg/dL (ref 8–23)
CO2: 32 mmol/L (ref 22–32)
Calcium: 10 mg/dL (ref 8.9–10.3)
Chloride: 91 mmol/L — ABNORMAL LOW (ref 98–111)
Creatinine, Ser: 1.28 mg/dL — ABNORMAL HIGH (ref 0.44–1.00)
GFR calc Af Amer: 51 mL/min — ABNORMAL LOW (ref >60)
GFR calc non Af Amer: 44 mL/min — ABNORMAL LOW (ref >60)
Glucose, Bld: 177 mg/dL — ABNORMAL HIGH (ref 70–99)
Potassium: 3 mmol/L — ABNORMAL LOW (ref 3.5–5.1)
Sodium: 135 mmol/L (ref 135–145)

## 2019-03-30 LAB — CBC
HCT: 33.2 % — ABNORMAL LOW (ref 36.0–46.0)
Hemoglobin: 9.4 g/dL — ABNORMAL LOW (ref 12.0–15.0)
MCH: 22.4 pg — ABNORMAL LOW (ref 26.0–34.0)
MCHC: 28.3 g/dL — ABNORMAL LOW (ref 30.0–36.0)
MCV: 79 fL — ABNORMAL LOW (ref 80.0–100.0)
Platelets: 360 10*3/uL (ref 150–400)
RBC: 4.2 MIL/uL (ref 3.87–5.11)
RDW: 19.3 % — ABNORMAL HIGH (ref 11.5–15.5)
WBC: 15.2 10*3/uL — ABNORMAL HIGH (ref 4.0–10.5)
nRBC: 0 % (ref 0.0–0.2)

## 2019-03-30 LAB — HEPARIN LEVEL (UNFRACTIONATED): Heparin Unfractionated: 0.11 IU/mL — ABNORMAL LOW (ref 0.30–0.70)

## 2019-03-30 LAB — MAGNESIUM: Magnesium: 1.9 mg/dL (ref 1.7–2.4)

## 2019-03-30 LAB — SARS CORONAVIRUS 2 BY RT PCR (HOSPITAL ORDER, PERFORMED IN ~~LOC~~ HOSPITAL LAB): SARS Coronavirus 2: NEGATIVE

## 2019-03-30 IMAGING — CT CT ANGIOGRAPHY CHEST
2 of 7 series · 18 of 46 positions shown · IV contrast (APPLIED)
Comparison: None.

CLINICAL DATA: Abnormal EKG, chest pressure. PE suspected, high
pretest probability.

EXAM:
CT ANGIOGRAPHY CHEST WITH CONTRAST
TECHNIQUE: Multidetector CT imaging of the chest was performed using the
standard protocol during bolus administration of intravenous
contrast. Multiplanar CT image reconstructions and MIPs were
obtained to evaluate the vascular anatomy.
CONTRAST:  100mL OMNIPAQUE IOHEXOL 350 MG/ML SOLN

[Series 7: thins · axial · 0.80mm/px · z∈[+1092,+1315]mm · 15 of 359 slices shown]
[im 20/359  lung]
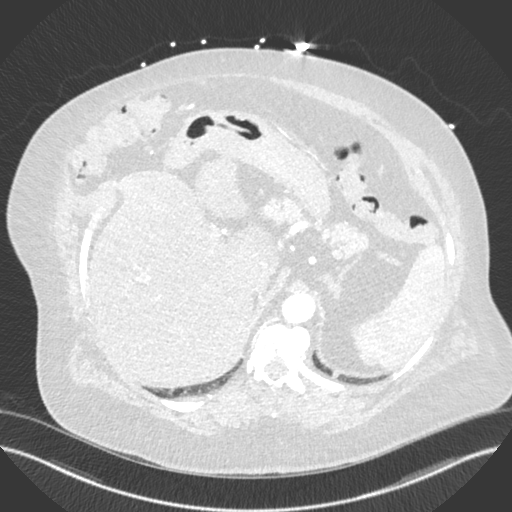
[im 40/359  soft-tissue]
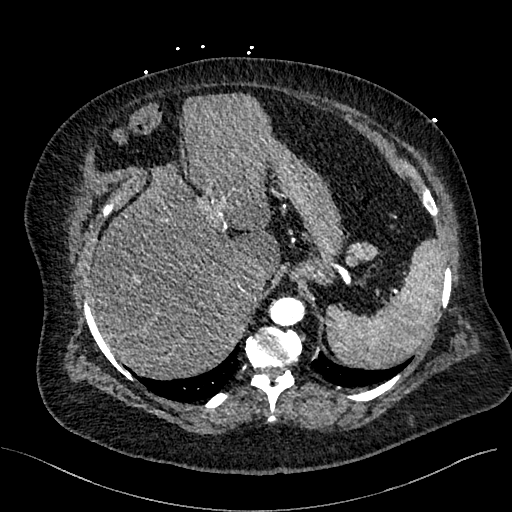
[im 60/359  lung]
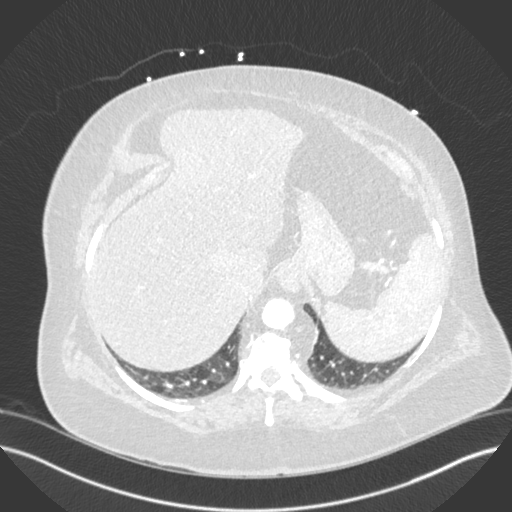
[im 80/359  soft-tissue]
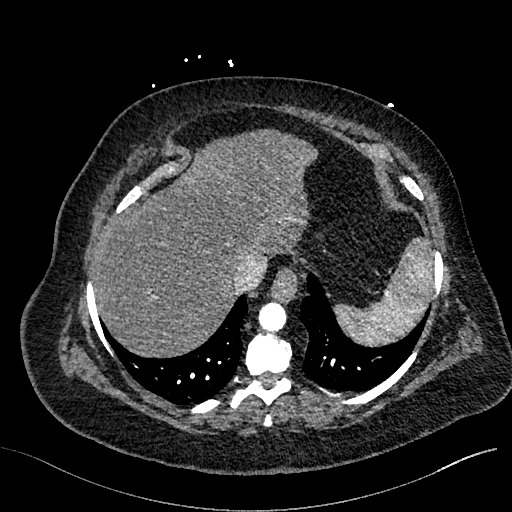
[im 120/359  lung]
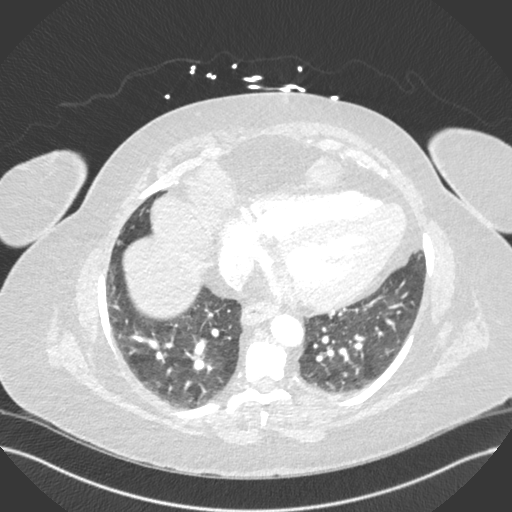
[im 140/359  soft-tissue]
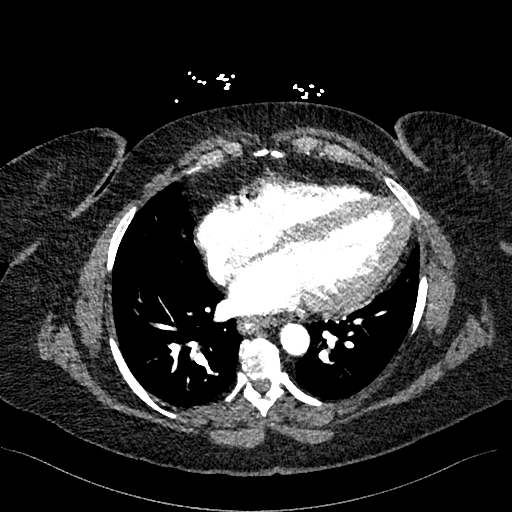
[im 160/359  lung]
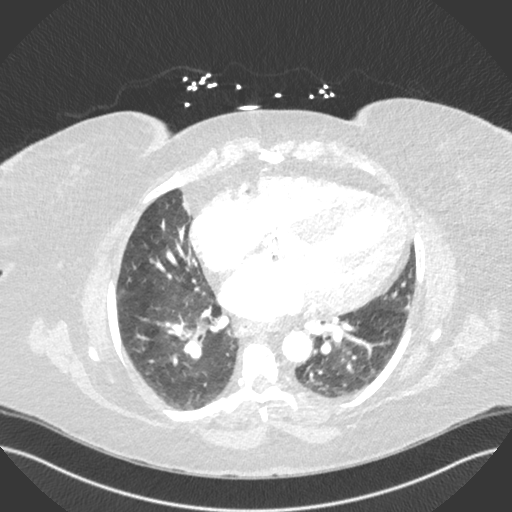
[im 180/359  soft-tissue]
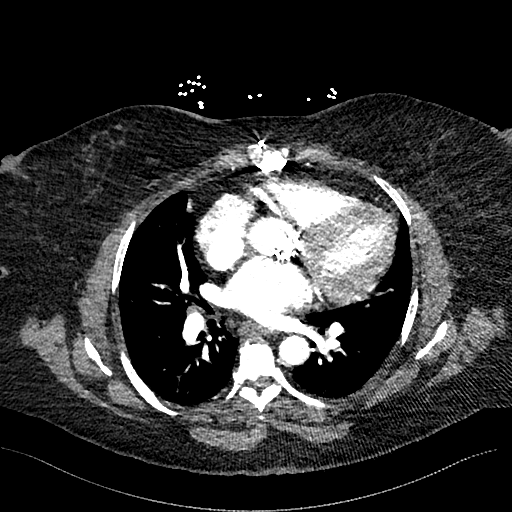
[im 199/359  lung]
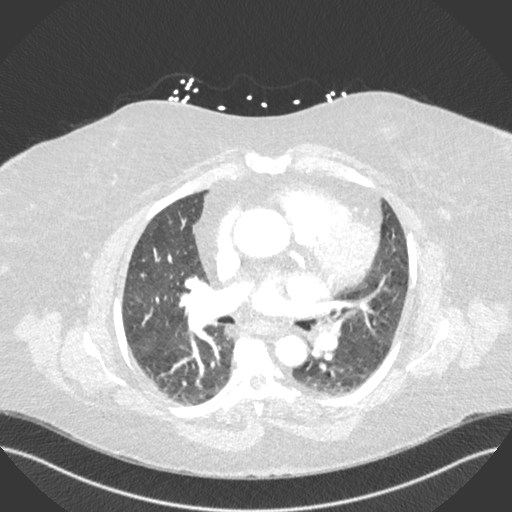
[im 219/359  soft-tissue]
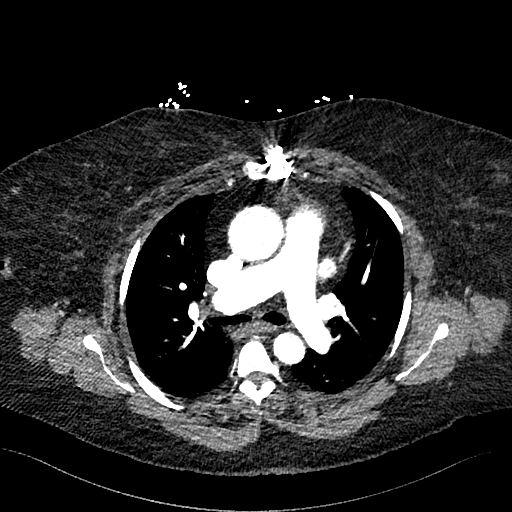
[im 239/359  lung]
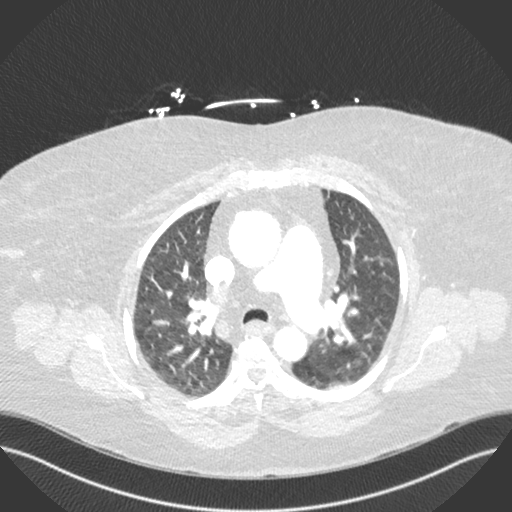
[im 279/359  soft-tissue]
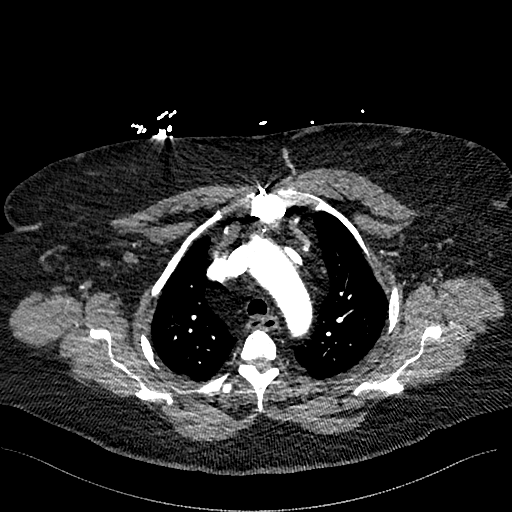
[im 299/359  lung]
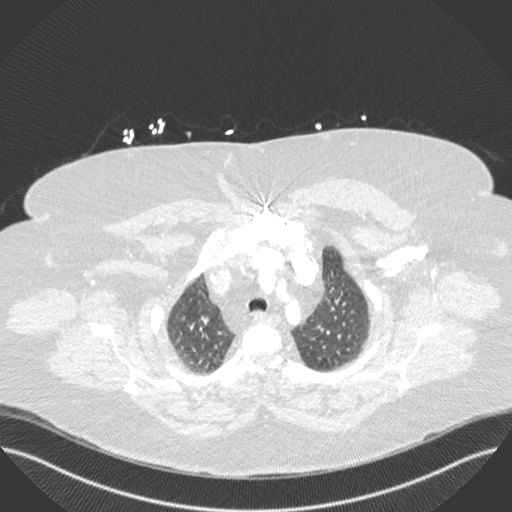
[im 319/359  soft-tissue]
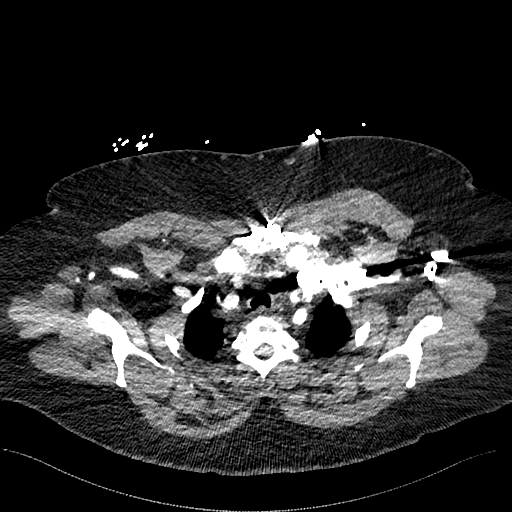
[im 339/359  lung]
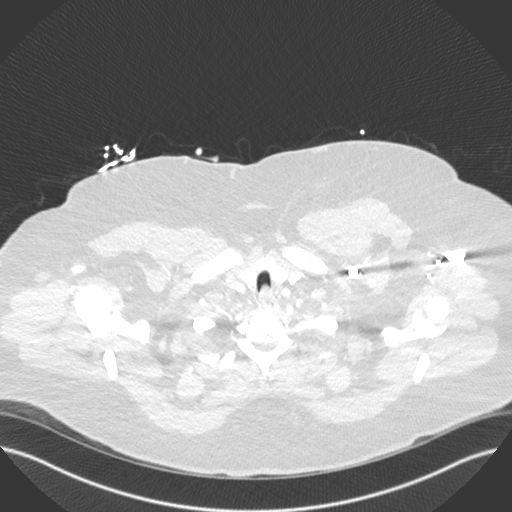

[Series 8: cor · coronal · 0.50mm/px · 3 of 161 slices shown]
[im 41/161  soft-tissue]
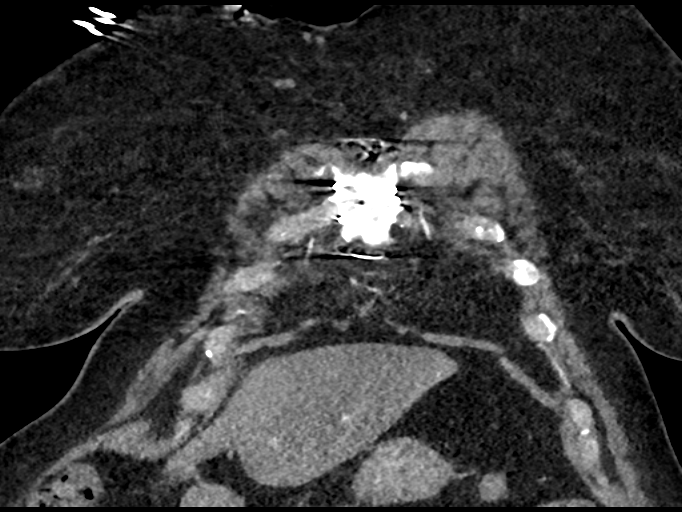
[im 81/161  soft-tissue]
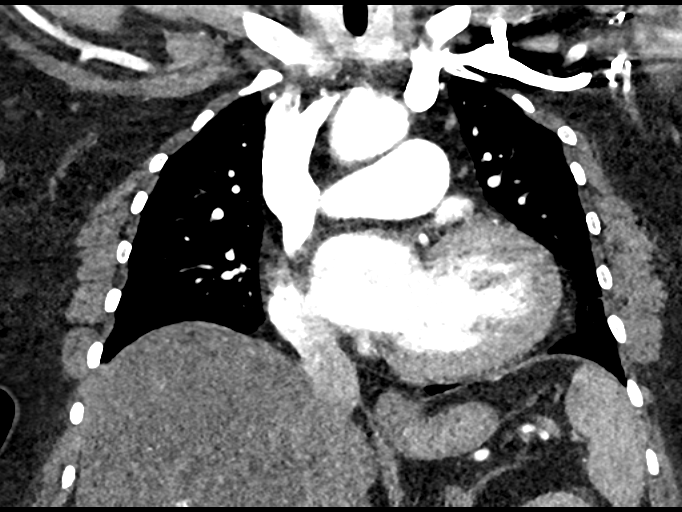
[im 121/161  soft-tissue]
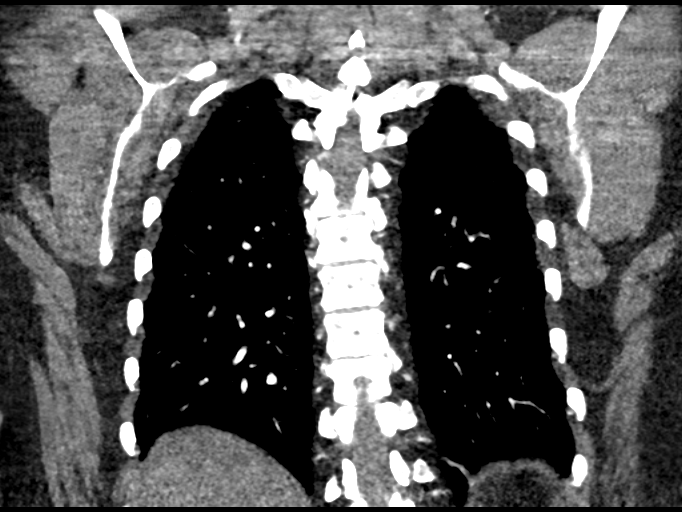

[18 of 46 positions shown; findings below may reference images not displayed]

FINDINGS: Cardiovascular: There is no pulmonary embolism identified within the
main, lobar or central segmental pulmonary arteries bilaterally.

No thoracic aortic aneurysm or evidence of aortic dissection. Aortic
valve hardware in place. Cardiomegaly. No pericardial effusion.

Mediastinum/Nodes: No mass or enlarged lymph nodes seen within the
mediastinum or perihilar regions. Focal hyperdense material within
the lower esophagus, of uncertain etiology, most suggestive of oral
contrast material. Esophagus otherwise unremarkable. Trachea and
central bronchi are unremarkable.

Lungs/Pleura: Lungs are clear. No pleural effusion or pneumothorax.

Upper Abdomen: Limited images of the upper abdomen are unremarkable.

Musculoskeletal: No acute or suspicious osseous finding. Mild
degenerative spondylosis throughout the slightly scoliotic thoracic
spine. Median sternotomy wires in place.

Review of the MIP images confirms the above findings.
IMPRESSION: 1. No pulmonary embolism seen.
2. No pneumonia or pulmonary edema.
3. Cardiomegaly. No pericardial effusion.
4. Focal hyperdense material within the lower esophagus, most
suggestive of oral contrast material. Has patient had recent oral
contrast for an outside study? Otherwise, of uncertain etiology.
Esophagus appears otherwise unremarkable.

## 2019-03-30 IMAGING — CR CHEST - 2 VIEW
2 series · 2 of 2 positions shown · non-contrast
Comparison: [DATE]

CLINICAL DATA: Chest pressure and shortness of breath for 2 days

EXAM:
CHEST - 2 VIEW

[chest lat]
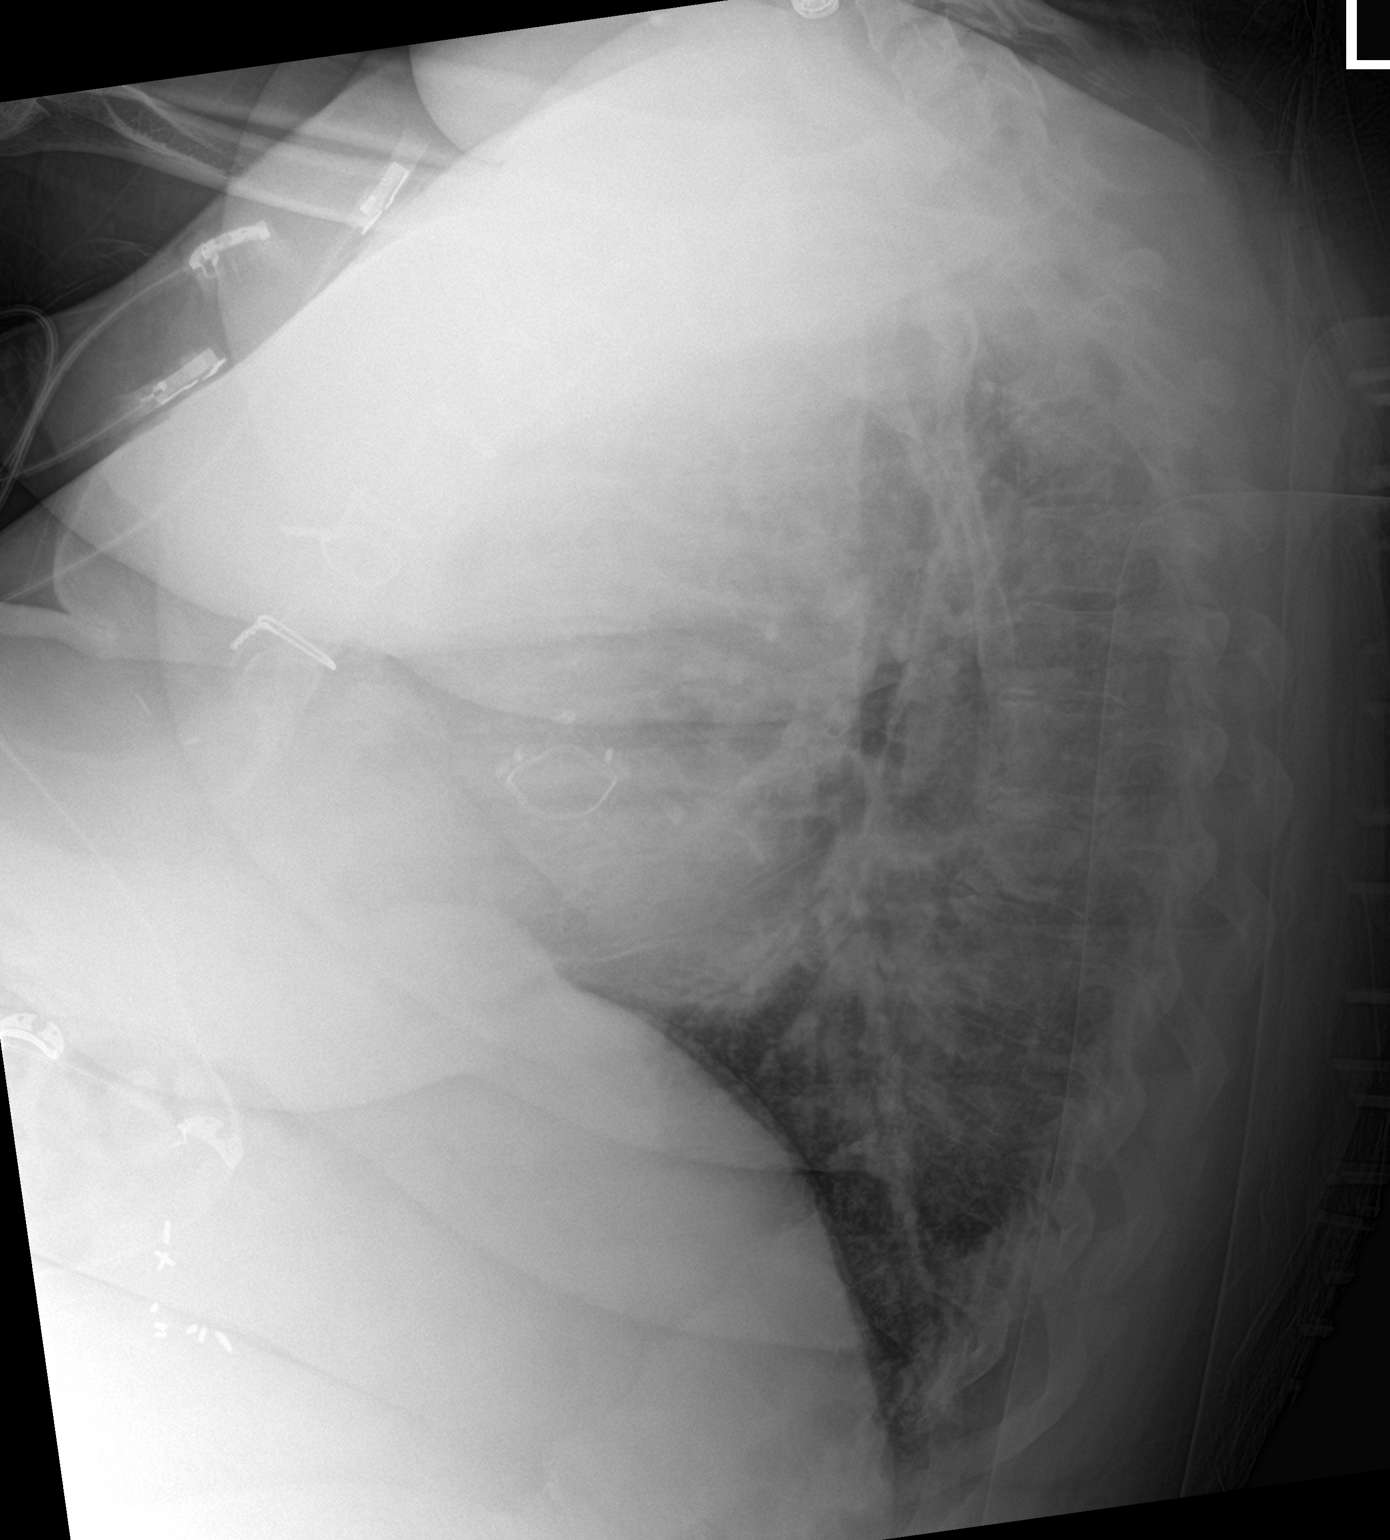

[chest ap]
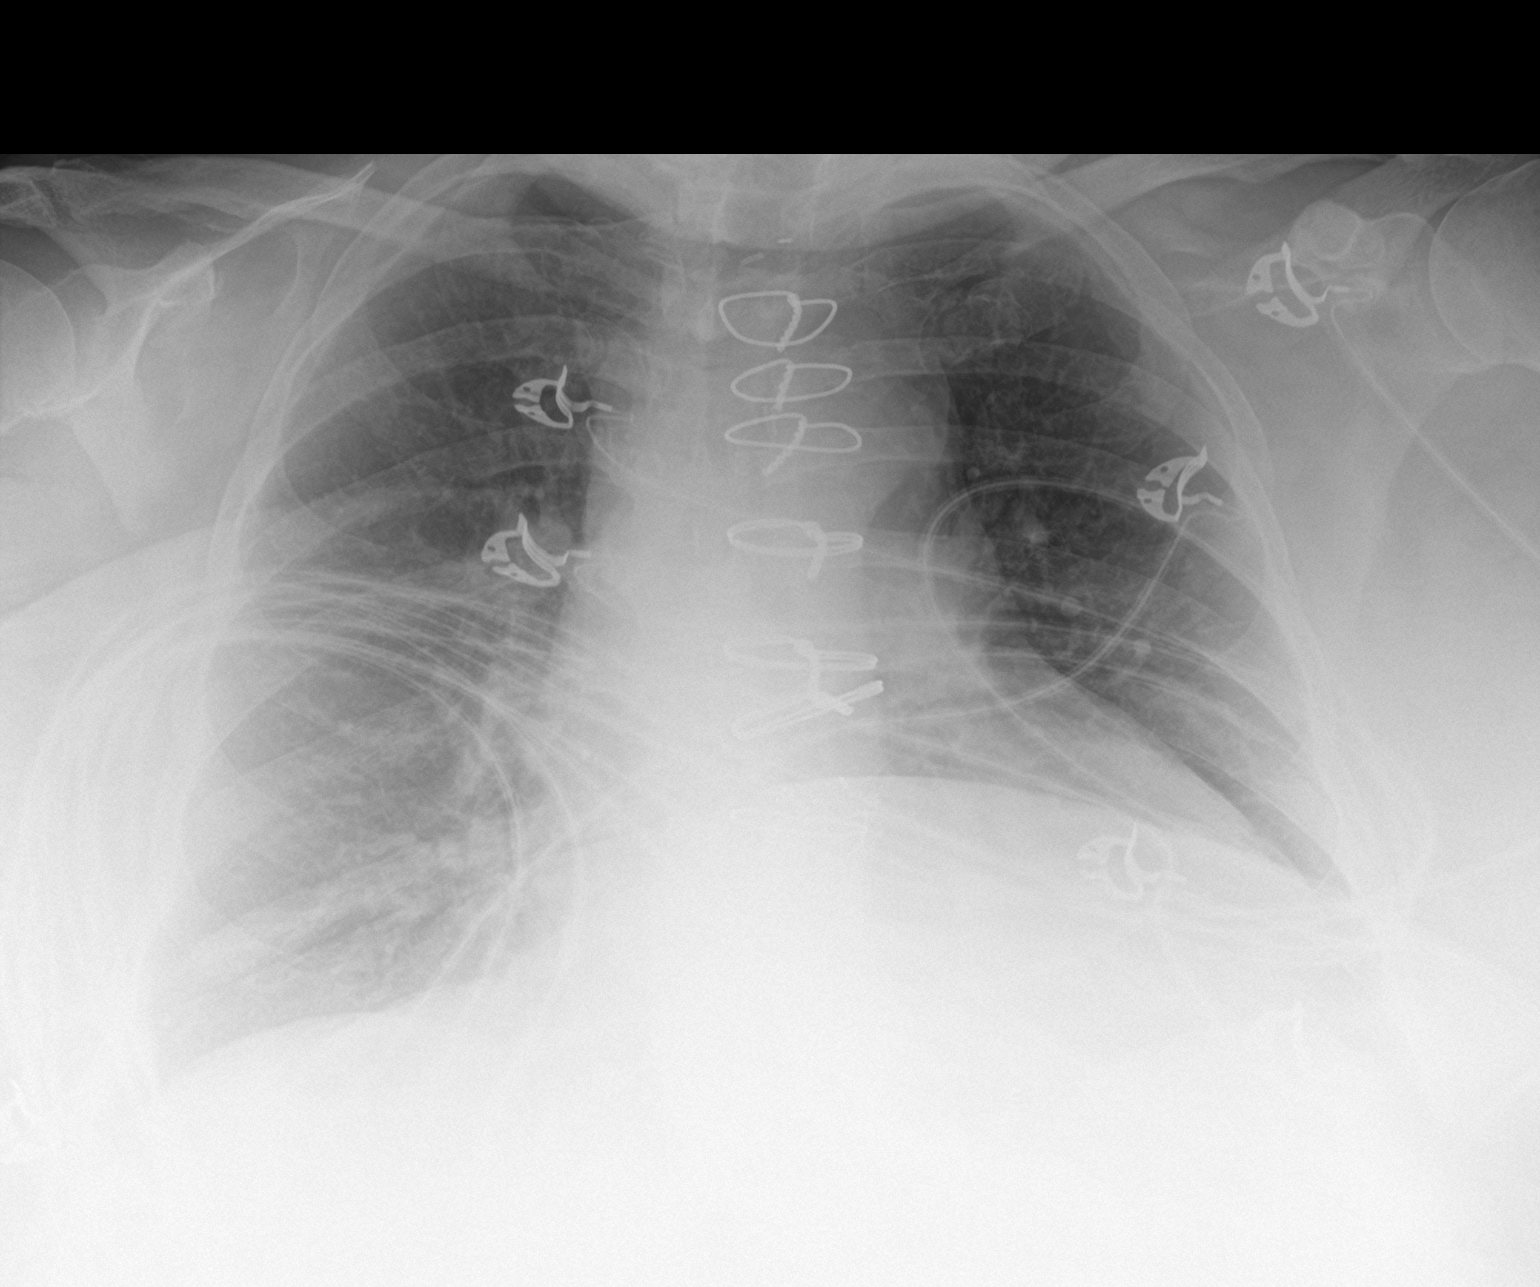

[2 of 2 positions shown; findings below may reference images not displayed]

FINDINGS: Cardiac shadow is enlarged but stable. Postsurgical changes are
noted. Lungs are well aerated without focal infiltrate or sizable
effusion. No acute bony abnormality is seen.
IMPRESSION: Postsurgical changes consistent with history of aortic valve
replacement. No acute abnormality noted.

## 2019-03-30 MED ORDER — HEPARIN BOLUS VIA INFUSION
4000.0000 [IU] | Freq: Once | INTRAVENOUS | Status: AC
  Administered 2019-03-30: 4000 [IU] via INTRAVENOUS
  Filled 2019-03-30: qty 4000

## 2019-03-30 MED ORDER — ASPIRIN 81 MG PO CHEW
324.0000 mg | CHEWABLE_TABLET | Freq: Once | ORAL | Status: AC
  Administered 2019-03-30: 324 mg via ORAL
  Filled 2019-03-30: qty 4

## 2019-03-30 MED ORDER — POTASSIUM CHLORIDE CRYS ER 20 MEQ PO TBCR
40.0000 meq | EXTENDED_RELEASE_TABLET | Freq: Once | ORAL | Status: AC
  Administered 2019-03-30: 40 meq via ORAL
  Filled 2019-03-30: qty 2

## 2019-03-30 MED ORDER — ASPIRIN EC 81 MG PO TBEC
81.0000 mg | DELAYED_RELEASE_TABLET | Freq: Every day | ORAL | Status: DC
  Administered 2019-03-31 – 2019-04-02 (×3): 81 mg via ORAL
  Filled 2019-03-30 (×3): qty 1

## 2019-03-30 MED ORDER — PRAVASTATIN SODIUM 10 MG PO TABS
20.0000 mg | ORAL_TABLET | Freq: Every day | ORAL | Status: DC
  Administered 2019-03-30 – 2019-04-01 (×3): 20 mg via ORAL
  Filled 2019-03-30 (×3): qty 2

## 2019-03-30 MED ORDER — NITROGLYCERIN 0.4 MG SL SUBL
0.4000 mg | SUBLINGUAL_TABLET | SUBLINGUAL | Status: DC | PRN
  Administered 2019-03-30: 0.4 mg via SUBLINGUAL
  Filled 2019-03-30: qty 1

## 2019-03-30 MED ORDER — IOHEXOL 350 MG/ML SOLN
100.0000 mL | Freq: Once | INTRAVENOUS | Status: AC | PRN
  Administered 2019-03-30: 100 mL via INTRAVENOUS

## 2019-03-30 MED ORDER — NITROGLYCERIN 0.4 MG SL SUBL: 0.4000 mg | SUBLINGUAL_TABLET | SUBLINGUAL | Status: DC | PRN

## 2019-03-30 MED ORDER — ASPIRIN EC 81 MG PO TBEC
81.0000 mg | DELAYED_RELEASE_TABLET | Freq: Every day | ORAL | Status: DC
  Administered 2019-03-30: 81 mg via ORAL
  Filled 2019-03-30: qty 1

## 2019-03-30 MED ORDER — HEPARIN (PORCINE) 25000 UT/250ML-% IV SOLN
1650.0000 [IU]/h | INTRAVENOUS | Status: DC
  Administered 2019-03-30: 950 [IU]/h via INTRAVENOUS
  Administered 2019-04-01: 1650 [IU]/h via INTRAVENOUS
  Filled 2019-03-30 (×4): qty 250

## 2019-03-30 MED ORDER — ACETAMINOPHEN 325 MG PO TABS: 650.0000 mg | ORAL_TABLET | ORAL | Status: DC | PRN

## 2019-03-30 MED ORDER — HEPARIN SODIUM (PORCINE) 5000 UNIT/ML IJ SOLN: 5000.0000 [IU] | Freq: Three times a day (TID) | INTRAMUSCULAR | Status: DC

## 2019-03-30 MED ORDER — ONDANSETRON HCL 4 MG/2ML IJ SOLN: 4.0000 mg | Freq: Four times a day (QID) | INTRAMUSCULAR | Status: DC | PRN

## 2019-03-30 MED ORDER — ASPIRIN 300 MG RE SUPP: 300.0000 mg | RECTAL | Status: AC

## 2019-03-30 MED ORDER — IRBESARTAN 75 MG PO TABS
75.0000 mg | ORAL_TABLET | Freq: Every day | ORAL | Status: DC
  Administered 2019-03-31 – 2019-04-02 (×3): 75 mg via ORAL
  Filled 2019-03-30 (×4): qty 1

## 2019-03-30 MED ORDER — METOPROLOL TARTRATE 50 MG PO TABS
50.0000 mg | ORAL_TABLET | Freq: Two times a day (BID) | ORAL | Status: DC
  Administered 2019-03-30 – 2019-04-02 (×6): 50 mg via ORAL
  Filled 2019-03-30 (×6): qty 1

## 2019-03-30 MED ORDER — ASPIRIN 81 MG PO CHEW: 324.0000 mg | CHEWABLE_TABLET | ORAL | Status: AC

## 2019-03-30 NOTE — Progress Notes (Signed)
ANTICOAGULATION CONSULT NOTE - Initial Consult  Pharmacy Consult for Heparin Indication: ACS/STEMI  No Known Allergies  Patient Measurements: Height: 5' (152.4 cm) Weight: 287 lb 14.7 oz (130.6 kg) IBW/kg (Calculated) : 45.5 Heparin Dosing Weight: 79 kg  Vital Signs: Temp: 98 F (36.7 C) (07/21 1341) BP: 149/67 (07/21 1514) Pulse Rate: 93 (07/21 1514)  Labs: Recent Labs    03/30/19 1335  HGB 9.4*  HCT 33.2*  PLT 360  CREATININE 1.28*  TROPONINIHS 108*    Estimated Creatinine Clearance: 55.7 mL/min (A) (by C-G formula based on SCr of 1.28 mg/dL (H)).   Medical History: Past Medical History:  Diagnosis Date  . Aortic stenosis 03/23/2013   Overview:  02/18/13 TTE EF >55%. Critical AS with mean Ao valve gradient of 82 mm Hg. No AI. No MR, PR, mild TR. Estimated RVSP 30 mm Hg.  . Bicuspid aortic valve   . Edema of both legs 02/20/2017  . 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 Heart failure ([place])   . HTN (hypertension) 03/23/2013  . 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 Morbid obesity ([place]) 02/20/2017    Medications:  Infusions:  . heparin      Assessment: Pt is a 63YOF sent here by her PCP for an abnormal ECG. Pt also reports that it feels like there is a golf ball in her throat and pressure in her chest. Pt on ASA PTA, but no anticoagulation PTA. Hgb 9.4 and Plts 360. Pharmacy to dose heparin for ACS/STEMI.  Goal of Therapy:  Heparin level 0.3-0.7 units/ml Monitor platelets by anticoagulation protocol: Yes   Plan:  Heparin 4000 unit bolus IV x1 then heparin 950 units/hr IV Check 6 hour HL Monitor daily CBC and HL, s/sx of bleed  Natale Lay 03/30/2019,3:23 PM

## 2019-03-30 NOTE — ED Notes (Signed)
ED TO INPATIENT HANDOFF REPORT  ED Nurse Name and Phone #: William Hamburger, [facility]  S Name/Age/Gender [name] 63 y.o. female Room/Bed: 021C/021C  Code Status   Code Status: Not on file  Home/SNF/Other Home Patient oriented to: situation Is this baseline? No   Triage Complete: Triage complete  Chief Complaint Chest Pain  Triage Note Pt was sent here by her PCP for an abnormal ECG. Pt originally went to her PCP for pressure in her chest. Pt also reports she feels like there is a golf ball in her throat.    Allergies No Known Allergies  Level of Care/Admitting Diagnosis ED Disposition    None      B Medical/Surgery History Past Medical History:  Diagnosis Date  . Aortic stenosis 03/23/2013   Overview:  02/18/13 TTE EF >55%. Critical AS with mean Ao valve gradient of 82 mm Hg. No AI. No MR, PR, mild TR. Estimated RVSP 30 mm Hg.  . Bicuspid aortic valve   . Edema of both legs 02/20/2017  . 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 Heart failure ([place])   . HTN (hypertension) 03/23/2013  . 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 Morbid obesity ([place]) 02/20/2017   Past Surgical History:  Procedure Laterality Date  . TEE WITHOUT CARDIOVERSION N/A 07/01/2018   Procedure: TRANSESOPHAGEAL ECHOCARDIOGRAM (TEE);  Surgeon: Buford Dresser, MD;  Location: [facility] ENDOSCOPY;  Service: Cardiovascular;  Laterality: N/A;  . TISSUE AORTIC VALVE REPLACEMENT       A IV Location/Drains/Wounds Patient Lines/Drains/Airways Status   Active Line/Drains/Airways    None          Intake/Output Last 24 hours No intake or output data in the 24 hours ending 03/30/19 1516  Labs/Imaging Results for orders placed or performed during the hospital encounter of 03/30/19 (from the past 48 hour(s))  Basic metabolic panel     Status: Abnormal   Collection Time: 03/30/19  1:35 PM  Result Value Ref Range   Sodium 135 135 - 145 mmol/L   Potassium 3.0 (L) 3.5 - 5.1 mmol/L   Chloride 91  (L) 98 - 111 mmol/L   CO2 32 22 - 32 mmol/L   Glucose, Bld 177 (H) 70 - 99 mg/dL   BUN 14 8 - 23 mg/dL   Creatinine, Ser 1.28 (H) 0.44 - 1.00 mg/dL   Calcium 10.0 8.9 - 10.3 mg/dL   GFR calc non Af Amer 44 (L) >60 mL/min   GFR calc Af Amer 51 (L) >60 mL/min   Anion gap 12 5 - 15    Comment: Performed at [facility], [address] [place], [place] [address]  CBC     Status: Abnormal   Collection Time: 03/30/19  1:35 PM  Result Value Ref Range   WBC 15.2 (H) 4.0 - 10.5 K/uL   RBC 4.20 3.87 - 5.11 MIL/uL   Hemoglobin 9.4 (L) 12.0 - 15.0 g/dL   HCT 33.2 (L) 36.0 - 46.0 %   MCV 79.0 (L) 80.0 - 100.0 fL   MCH 22.4 (L) 26.0 - 34.0 pg   MCHC 28.3 (L) 30.0 - 36.0 g/dL   RDW 19.3 (H) 11.5 - 15.5 %   Platelets 360 150 - 400 K/uL   nRBC 0.0 0.0 - 0.2 %    Comment: Performed at [facility] [address], [place], [place] [address]  Troponin I (High Sensitivity)     Status: Abnormal   Collection Time: 03/30/19  1:35  PM  Result Value Ref Range   Troponin I (High Sensitivity) 108 (HH) <18 ng/L    Comment: CRITICAL RESULT CALLED TO, READ BACK BY AND VERIFIED WITH: L Nicky Milhouse RN AT [phone] ON [id number] BY K FORSYTH (NOTE) Elevated high sensitivity troponin I (hsTnI) values and significant  changes across serial measurements may suggest ACS but many other  chronic and acute conditions are known to elevate hsTnI results.  Refer to the Links section for chest pain algorithms and additional  guidance. Performed at [facility], [place] [address], [place], [place] [address]   D-dimer, quantitative     Status: Abnormal   Collection Time: 03/30/19  2:00 PM  Result Value Ref Range   D-Dimer, Quant 1.36 (H) 0.00 - 0.50 ug/mL-FEU    Comment: (NOTE) At the manufacturer cut-off of 0.50 ug/mL FEU, this assay has been documented to exclude PE with a sensitivity and negative predictive value of 97 to 99%.  At this time, this assay has not been approved by the FDA to exclude  DVT/VTE. Results should be correlated with clinical presentation. Performed at [facility], [place] [address] [place], [place] [address]    Dg Chest 2 View  Result Date: 03/30/2019 CLINICAL DATA:  Chest pressure and shortness of breath for 2 days EXAM: CHEST - 2 VIEW COMPARISON:  03/12/2013 FINDINGS: Cardiac shadow is enlarged but stable. Postsurgical changes are noted. Lungs are well aerated without focal infiltrate or sizable effusion. No acute bony abnormality is seen. IMPRESSION: Postsurgical changes consistent with history of aortic valve replacement. No acute abnormality noted. Electronically Signed   By: Inez Catalina M.D.   On: 03/30/2019 15:12    Pending Labs Unresulted Labs (From admission, onward)    Start     Ordered   03/30/19 1517  Magnesium  Add-on,   STAT     03/30/19 1516   03/30/19 1425  SARS Coronavirus 2 (CEPHEID - Performed in [place] hospital lab), Hosp Order  (Asymptomatic Patients Labs)  Once,   STAT    Question:  Rule Out  Answer:  Yes   03/30/19 1424          Vitals/Pain Today's Vitals   03/30/19 1335 03/30/19 1341 03/30/19 1514 03/30/19 1514  BP:  (!) 147/53  (!) 149/67  Pulse:    93  Resp:  19  18  Temp:  98 F (36.7 C)    SpO2:  94%  95%  Weight: 130.6 kg     Height: 5' (1.524 m)     PainSc:   2      Isolation Precautions No active isolations  Medications Medications  nitroGLYCERIN (NITROSTAT) SL tablet 0.4 mg (has no administration in time range)  potassium chloride SA (K-DUR) CR tablet 40 mEq (has no administration in time range)  aspirin chewable tablet 324 mg (324 mg Oral Given 03/30/19 1500)    Mobility walks Low fall risk   Focused Assessments Cardiac Assessment Handoff:  Cardiac Rhythm: Normal sinus rhythm No results found for: CKTOTAL, CKMB, CKMBINDEX, TROPONINI Lab Results  Component Value Date   DDIMER 1.36 (H) 03/30/2019   Does the Patient currently have chest pain? No     R Recommendations: See Admitting  Provider Note  Report given to:   Additional Notes:

## 2019-03-30 NOTE — ED Notes (Signed)
ED TO INPATIENT HANDOFF REPORT  ED Nurse Name and Phone #:  [phone]  S Name/Age/Gender [name] 63 y.o. female Room/Bed: 101B/510C  Code Status   Code Status: Not on file  Home/SNF/Other Home Patient oriented to: self, place, time and situation Is this baseline? Yes   Triage Complete: Triage complete  Chief Complaint Chest Pain  Triage Note Pt was sent here by her PCP for an abnormal ECG. Pt originally went to her PCP for pressure in her chest. Pt also reports she feels like there is a golf ball in her throat.    Allergies No Known Allergies  Level of Care/Admitting Diagnosis ED Disposition    ED Disposition Condition Comment   Admit  Hospital Area: [facility] [[id number]]  Level of Care: Telemetry Cardiac [103]  Covid Evaluation: Confirmed COVID Negative  Diagnosis: Chest pain [585277]  Admitting Physician: Fay Records [[id number]]  Attending Physician: Fay Records [[id number]]  PT Class (Do Not Modify): Observation [104]  PT Acc Code (Do Not Modify): Observation [10022]       B Medical/Surgery History Past Medical History:  Diagnosis Date  . Aortic stenosis 03/23/2013   Overview:  02/18/13 TTE EF >55%. Critical AS with mean Ao valve gradient of 82 mm Hg. No AI. No MR, PR, mild TR. Estimated RVSP 30 mm Hg.  . Bicuspid aortic valve   . Edema of both legs 02/20/2017  . 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 Heart failure ([place])   . HTN (hypertension) 03/23/2013  . 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 Morbid obesity ([place]) 02/20/2017   Past Surgical History:  Procedure Laterality Date  . TEE WITHOUT CARDIOVERSION N/A 07/01/2018   Procedure: TRANSESOPHAGEAL ECHOCARDIOGRAM (TEE);  Surgeon: Buford Dresser, MD;  Location: [facility] ENDOSCOPY;  Service: Cardiovascular;  Laterality: N/A;  . TISSUE AORTIC VALVE REPLACEMENT       A IV Location/Drains/Wounds Patient Lines/Drains/Airways Status   Active Line/Drains/Airways    Name:   Placement  date:   Placement time:   Site:   Days:   Peripheral IV 03/30/19 Left Antecubital   03/30/19    1530    Antecubital   less than 1   Peripheral IV 03/30/19 Right   03/30/19    1605    -   less than 1          Intake/Output Last 24 hours No intake or output data in the 24 hours ending 03/30/19 2030  Labs/Imaging Results for orders placed or performed during the hospital encounter of 03/30/19 (from the past 48 hour(s))  Basic metabolic panel     Status: Abnormal   Collection Time: 03/30/19  1:35 PM  Result Value Ref Range   Sodium 135 135 - 145 mmol/L   Potassium 3.0 (L) 3.5 - 5.1 mmol/L   Chloride 91 (L) 98 - 111 mmol/L   CO2 32 22 - 32 mmol/L   Glucose, Bld 177 (H) 70 - 99 mg/dL   BUN 14 8 - 23 mg/dL   Creatinine, Ser 1.28 (H) 0.44 - 1.00 mg/dL   Calcium 10.0 8.9 - 10.3 mg/dL   GFR calc non Af Amer 44 (L) >60 mL/min   GFR calc Af Amer 51 (L) >60 mL/min   Anion gap 12 5 - 15    Comment: Performed at [facility], [address], [place], [place] [address]  CBC     Status: Abnormal   Collection Time: 03/30/19  1:35 PM  Result  Value Ref Range   WBC 15.2 (H) 4.0 - 10.5 K/uL   RBC 4.20 3.87 - 5.11 MIL/uL   Hemoglobin 9.4 (L) 12.0 - 15.0 g/dL   HCT 33.2 (L) 36.0 - 46.0 %   MCV 79.0 (L) 80.0 - 100.0 fL   MCH 22.4 (L) 26.0 - 34.0 pg   MCHC 28.3 (L) 30.0 - 36.0 g/dL   RDW 19.3 (H) 11.5 - 15.5 %   Platelets 360 150 - 400 K/uL   nRBC 0.0 0.0 - 0.2 %    Comment: Performed at [facility] [address], [place], [place] [address]  Troponin I (High Sensitivity)     Status: Abnormal   Collection Time: 03/30/19  1:35 PM  Result Value Ref Range   Troponin I (High Sensitivity) 108 (HH) <18 ng/L    Comment: CRITICAL RESULT CALLED TO, READ BACK BY AND VERIFIED WITH: L CHILTON RN AT 0272 ON [id number] BY K FORSYTH (NOTE) Elevated high sensitivity troponin I (hsTnI) values and significant  changes across serial measurements may suggest ACS but many other  chronic and acute  conditions are known to elevate hsTnI results.  Refer to the Links section for chest pain algorithms and additional  guidance. Performed at [facility], [place] [address] [place], [place] [address]   D-dimer, quantitative     Status: Abnormal   Collection Time: 03/30/19  2:00 PM  Result Value Ref Range   D-Dimer, Quant 1.36 (H) 0.00 - 0.50 ug/mL-FEU    Comment: (NOTE) At the manufacturer cut-off of 0.50 ug/mL FEU, this assay has been documented to exclude PE with a sensitivity and negative predictive value of 97 to 99%.  At this time, this assay has not been approved by the FDA to exclude DVT/VTE. Results should be correlated with clinical presentation. Performed at [facility], [place] [address], [place], [place] [address]   SARS Coronavirus 2 (CEPHEID - Performed in [place] hospital lab), Hosp Order     Status: None   Collection Time: 03/30/19  2:33 PM   Specimen: Nasopharyngeal Swab  Result Value Ref Range   SARS Coronavirus 2 NEGATIVE NEGATIVE    Comment: (NOTE) If result is NEGATIVE SARS-CoV-2 target nucleic acids are NOT DETECTED. The SARS-CoV-2 RNA is generally detectable in upper and lower  respiratory specimens during the acute phase of infection. The lowest  concentration of SARS-CoV-2 viral copies this assay can detect is 250  copies / mL. A negative result does not preclude SARS-CoV-2 infection  and should not be used as the sole basis for treatment or other  patient management decisions.  A negative result may occur with  improper specimen collection / handling, submission of specimen other  than nasopharyngeal swab, presence of viral mutation(s) within the  areas targeted by this assay, and inadequate number of viral copies  (<250 copies / mL). A negative result must be combined with clinical  observations, patient history, and epidemiological information. If result is POSITIVE SARS-CoV-2 target nucleic acids are DETECTED. The SARS-CoV-2 RNA is  generally detectable in upper and lower  respiratory specimens dur ing the acute phase of infection.  Positive  results are indicative of active infection with SARS-CoV-2.  Clinical  correlation with patient history and other diagnostic information is  necessary to determine patient infection status.  Positive results do  not rule out bacterial infection or co-infection with other viruses. If result is PRESUMPTIVE POSTIVE SARS-CoV-2 nucleic acids MAY BE PRESENT.   A  presumptive positive result was obtained on the submitted specimen  and confirmed on repeat testing.  While 2019 novel coronavirus  (SARS-CoV-2) nucleic acids may be present in the submitted sample  additional confirmatory testing may be necessary for epidemiological  and / or clinical management purposes  to differentiate between  SARS-CoV-2 and other Sarbecovirus currently known to infect humans.  If clinically indicated additional testing with an alternate test  methodology [phone]) is advised. The SARS-CoV-2 RNA is generally  detectable in upper and lower respiratory sp ecimens during the acute  phase of infection. The expected result is Negative. Fact Sheet for Patients:  StrictlyIdeas.no Fact Sheet for Healthcare Providers: BankingDealers.co.za This test is not yet approved or cleared by the [place] FDA and has been authorized for detection and/or diagnosis of SARS-CoV-2 by FDA under an Emergency Use Authorization (EUA).  This EUA will remain in effect (meaning this test can be used) for the duration of the COVID-19 declaration under Section 564(b)(1) of the Act, 21 U.S.C. section 360bbb-3(b)(1), unless the authorization is terminated or revoked sooner. Performed at [facility], [place] [address] [place], [place] [address]   Troponin I (High Sensitivity)     Status: Abnormal   Collection Time: 03/30/19  3:22 PM  Result Value Ref Range   Troponin I (High  Sensitivity) 114 (HH) <18 ng/L    Comment: CRITICAL VALUE NOTED.  VALUE IS CONSISTENT WITH PREVIOUSLY REPORTED AND CALLED VALUE. (NOTE) Elevated high sensitivity troponin I (hsTnI) values and significant  changes across serial measurements may suggest ACS but many other  chronic and acute conditions are known to elevate hsTnI results.  Refer to the Links section for chest pain algorithms and additional  guidance. Performed at [facility], [place] [address], [place], [place] [address]   Magnesium     Status: None   Collection Time: 03/30/19  3:22 PM  Result Value Ref Range   Magnesium 1.9 1.7 - 2.4 mg/dL    Comment: Performed at [facility], [place] [address], [place], [place] [address]   Dg Chest 2 View  Result Date: 03/30/2019 CLINICAL DATA:  Chest pressure and shortness of breath for 2 days EXAM: CHEST - 2 VIEW COMPARISON:  03/12/2013 FINDINGS: Cardiac shadow is enlarged but stable. Postsurgical changes are noted. Lungs are well aerated without focal infiltrate or sizable effusion. No acute bony abnormality is seen. IMPRESSION: Postsurgical changes consistent with history of aortic valve replacement. No acute abnormality noted. Electronically Signed   By: Inez Catalina M.D.   On: 03/30/2019 15:12   Ct Angio Chest Pe W And/or Wo Contrast  Result Date: 03/30/2019 CLINICAL DATA:  Abnormal EKG, chest pressure. PE suspected, high pretest probability. EXAM: CT ANGIOGRAPHY CHEST WITH CONTRAST TECHNIQUE: Multidetector CT imaging of the chest was performed using the standard protocol during bolus administration of intravenous contrast. Multiplanar CT image reconstructions and MIPs were obtained to evaluate the vascular anatomy. CONTRAST:  182mL OMNIPAQUE IOHEXOL 350 MG/ML SOLN COMPARISON:  None. FINDINGS: Cardiovascular: There is no pulmonary embolism identified within the main, lobar or central segmental pulmonary arteries bilaterally. No thoracic aortic aneurysm or evidence of aortic  dissection. Aortic valve hardware in place. Cardiomegaly. No pericardial effusion. Mediastinum/Nodes: No mass or enlarged lymph nodes seen within the mediastinum or perihilar regions. Focal hyperdense material within the lower esophagus, of uncertain etiology, most suggestive of oral contrast material. Esophagus otherwise unremarkable. Trachea and central bronchi are unremarkable. Lungs/Pleura: Lungs are clear. No pleural effusion or pneumothorax.  Upper Abdomen: Limited images of the upper abdomen are unremarkable. Musculoskeletal: No acute or suspicious osseous finding. Mild degenerative spondylosis throughout the slightly scoliotic thoracic spine. Median sternotomy wires in place. Review of the MIP images confirms the above findings. IMPRESSION: 1. No pulmonary embolism seen. 2. No pneumonia or pulmonary edema. 3. Cardiomegaly. No pericardial effusion. 4. Focal hyperdense material within the lower esophagus, most suggestive of oral contrast material. Has patient had recent oral contrast for an outside study? Otherwise, of uncertain etiology. Esophagus appears otherwise unremarkable. Electronically Signed   By: Franki Cabot M.D.   On: 03/30/2019 17:30    Pending Labs Unresulted Labs (From admission, onward)    Start     Ordered   03/31/19 0500  Heparin level (unfractionated)  Daily,   R     03/30/19 1523   03/31/19 0500  CBC  Daily,   R     03/30/19 1523   03/31/19 0500  Lipid panel  Tomorrow morning,   R     03/30/19 2023   03/30/19 2200  Heparin level (unfractionated)  Once-Timed,   STAT     03/30/19 1523   03/30/19 2023  HIV antibody (Routine Testing)  Once,   STAT     03/30/19 2023   03/30/19 2023  Creatinine, serum  (heparin)  Once,   STAT    Comments: Baseline for heparin therapy IF NOT ALREADY DRAWN.    03/30/19 2023   Signed and Held  CBC  (heparin)  Once,   R    Comments: Baseline for heparin therapy IF NOT ALREADY DRAWN.  Notify MD if PLT < 100 K.    Signed and Held   Signed and  Held  Brain natriuretic peptide  Once,   R     Signed and Held   Signed and Occupational hygienist morning,   R     Signed and Held   Signed and Held  CBC  Tomorrow morning,   R     Signed and Held          Vitals/Pain Today's Vitals   03/30/19 1815 03/30/19 1830 03/30/19 1845 03/30/19 2028  BP: (!) 171/66 (!) 146/65 (!) 142/74 120/82  Pulse: 83 91 95 93  Resp: 17 17 17  (!) 30  Temp:      SpO2: 100% 93% 99% 93%  Weight:      Height:      PainSc:    0-No pain    Isolation Precautions No active isolations  Medications Medications  nitroGLYCERIN (NITROSTAT) SL tablet 0.4 mg (0.4 mg Sublingual Given 03/30/19 1524)  heparin ADULT infusion 100 units/mL (25000 units/280mL sodium chloride 0.45%) (950 Units/hr Intravenous New Bag/Given 03/30/19 1610)  acetaminophen (TYLENOL) tablet 650 mg (has no administration in time range)  ondansetron (ZOFRAN) injection 4 mg (has no administration in time range)  aspirin EC tablet 81 mg (81 mg Oral Given 03/30/19 1934)  metoprolol tartrate (LOPRESSOR) tablet 50 mg (has no administration in time range)  pravastatin (PRAVACHOL) tablet 20 mg (has no administration in time range)  irbesartan (AVAPRO) tablet 75 mg (has no administration in time range)  aspirin chewable tablet 324 mg (324 mg Oral Given 03/30/19 1500)  potassium chloride SA (K-DUR) CR tablet 40 mEq (40 mEq Oral Given 03/30/19 1523)  heparin bolus via infusion 4,000 Units (4,000 Units Intravenous Bolus from Bag 03/30/19 1610)  iohexol (OMNIPAQUE) 350 MG/ML injection 100 mL (100 mLs Intravenous Contrast Given 03/30/19 1701)  Mobility walks Moderate fall risk   Focused Assessments Cardiac Assessment Handoff:  Cardiac Rhythm: Normal sinus rhythm No results found for: CKTOTAL, CKMB, CKMBINDEX, TROPONINI Lab Results  Component Value Date   DDIMER 1.36 (H) 03/30/2019   Does the Patient currently have chest pain? No     R Recommendations: See Admitting Provider  Note  Report given to:   Additional Notes:  Currently no cp, continues on Heparin gtt, alert/oriented.

## 2019-03-30 NOTE — Progress Notes (Signed)
[facility] for Heparin Indication: ACS/STEMI  No Known Allergies  Patient Measurements: Height: 5' (152.4 cm) Weight: 287 lb 14.7 oz (130.6 kg) IBW/kg (Calculated) : 45.5 Heparin Dosing Weight: 79 kg  Vital Signs: Temp: 99.4 F (37.4 C) (07/21 2151) Temp Source: Oral (07/21 2151) BP: 117/67 (07/21 2337) Pulse Rate: 97 (07/21 2337)  Labs: Recent Labs    03/30/19 1335 03/30/19 1522 03/30/19 2045 03/30/19 2156  HGB 9.4*  --   --   --   HCT 33.2*  --   --   --   PLT 360  --   --   --   HEPARINUNFRC  --   --   --  0.11*  CREATININE 1.28*  --  1.17*  --   TROPONINIHS 108* 114* 120* 114*    Estimated Creatinine Clearance: 61 mL/min (A) (by C-G formula based on SCr of 1.17 mg/dL (H)).  Assessment: 63 y.o. female with chest pain for heparin   Goal of Therapy:  Heparin level 0.3-0.7 units/ml Monitor platelets by anticoagulation protocol: Yes   Plan:  Heparin 2000 units IV bolus, then increase heparin  1300 units/hr Follow-up am labs.  Caryl Pina 03/30/2019,11:58 PM

## 2019-03-30 NOTE — ED Notes (Signed)
Patient transported to X-ray 

## 2019-03-30 NOTE — ED Triage Notes (Signed)
Pt was sent here by her PCP for an abnormal ECG. Pt originally went to her PCP for pressure in her chest. Pt also reports she feels like there is a golf ball in her throat.

## 2019-03-30 NOTE — H&P (Signed)
Cardiology Admission History and Physical:   Patient ID: [name] MRN: [id number]; DOB: 11-12-1955   Admission date: 03/30/2019  Primary Care Provider: [name], MD Primary Cardiologist: Bettina Gavia Chief Complaint:  Chest pain  Patient Profile:   [name] is a 63 y.o. female with hx of AS (s/p AVR), HTN, morbid obesity, OSA, lymphedema and LE ulcers who presents for chest pain/chest pressure.  History of Present Illness:   Pt is a 63 yo with hx of bicuspid AV and AS  She is s/p SAVR (23 mm [place]) in July 2014 (Dr Clementeen Graham [facility])  She also has a history of dynamic LVOT obstruction.   In addition she  has a history of severe OSA, HTN, HL   She is followed by B Munley in cardiology clinic She last saw him in clinic on March 23, 2019  BP wa elevated    REcomm starting Telmisartan   She also had mild decompensation of CHF   Metalozone added.    Ms. Whobrey who presents with chest pain yesterday   She said it felt like indigestion though she does not get much reflux    Intense   This eased some with TUMS but still had substernal chest pressure   ALso complained of R shoulder pain.   Pt went to PCP and brought to ED for further evaluation.  The patient does say she has had increased cramping in legs since metalozone added    The pt currently denies CP  Breathing is OK   No dizziness  No hx of syncope  Heart Pathway Score:     Past Medical History:  Diagnosis Date   Aortic stenosis 03/23/2013   Overview:  02/18/13 TTE EF >55%. Critical AS with mean Ao valve gradient of 82 mm Hg. No AI. No MR, PR, mild TR. Estimated RVSP 30 mm Hg.   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 Authorizing Provider  aspirin EC 81 MG tablet Take 81 mg by mouth daily.   Yes [provider]  metolazone (ZAROXOLYN) 2.5 MG tablet Take 1 tablet (2.5 mg total) by mouth once a week. Patient taking differently: Take 2.5 mg by mouth once a week. Monday 03/23/19 06/21/19 Yes Richardo Priest, MD  metoprolol tartrate (LOPRESSOR) 50 MG tablet Take 1 tablet by mouth twice daily 03/23/19  Yes Munley, Hilton Cork, MD  norethindrone (AYGESTIN) 5 MG tablet Take 5 mg by mouth 2 (two) times daily.    Yes [provider]  pravastatin (PRAVACHOL) 20 MG tablet Take 20 mg by mouth at bedtime.    Yes [provider]  telmisartan (MICARDIS) 20 MG tablet Take 1 tablet (20 mg total) by mouth daily. 03/23/19  Yes Richardo Priest, MD  torsemide (DEMADEX) 20 MG tablet Take 1 tablet (20 mg)  by mouth 2x's daily. Take 1 additional tablet daily for if weight is  280 lbs or greater. Patient taking differently: Take 20 mg by mouth 2 (two) times daily. Take 1 additional tablet daily for if weight is 288 lbs or greater. 02/09/19  Yes Richardo Priest, MD     Allergies:   No Known Allergies  Social History:   Social History   Socioeconomic History   Marital status: Married    Spouse name: Not on file   Number of children: Not on file   Years of education: Not on file   Highest education level: Not on file  Occupational History   Not on file  Social Needs   Financial resource strain: Not on file   Food insecurity    Worry: Not on file    Inability: Not on file   Transportation needs    Medical: Not on file    Non-medical: Not on file  Tobacco Use   Smoking status: Never Smoker   Smokeless  tobacco: Never Used  Substance and Sexual Activity   Alcohol use: No    Frequency: Never   Drug use: No   Sexual activity: Not Currently  Lifestyle   Physical activity    Days per week: Not on file    Minutes per session: Not on file   Stress: Not on file  Relationships   Social connections    Talks on phone: Not on file    Gets together: Not on file    Attends religious service: Not on file    Active member of club or organization: Not on file    Attends meetings of clubs or organizations: Not on file    Relationship status: Not on file   Intimate partner violence    Fear of current or ex partner: Not on file    Emotionally abused: Not on file    Physically abused: Not on file    Forced sexual activity: Not on file  Other Topics Concern   Not on file  Social History Narrative   Not on file    Family History:  The patient's family history includes Heart attack in her mother; Parkinson's disease in her father.    ROS:  Please see the history of present illness.  All other ROS reviewed and negative.     Physical Exam/Data:   Vitals:   03/30/19 1606 03/30/19 1615 03/30/19 1630 03/30/19 1720  BP: (!) 150/67 127/71 (!) 141/61 (!) 151/63  Pulse: 94 92 89 94  Resp: 19 14 (!) 21 (!) 22  Temp:      SpO2: 95% 95% 95% 93%  Weight:      Height:       No intake or output data in the 24 hours ending 03/30/19 1819 Last 3 Weights 03/30/2019 03/23/2019 01/21/2019  Weight (lbs) 287 lb 14.7 oz 288 lb 295 lb  Weight (kg) 130.6 kg 130.636 kg 133.811 kg     Body mass index is 56.23 kg/m.  General:  Morbidly obese 63 yo  in no acute distress HEENT: normal Lymph: no adenopathy Neck: no JVD Endocrine:  No thryomegaly Vascular: No carotid bruits; FA pulses 2+ bilaterally without bruits  Cardiac:  normal S1, S2; RRR; no murmur  Lungs:  clear to auscultation bilaterally, no wheezing, rhonchi or rales  Abd: soft, nontender, no hepatomegaly  Ext: Nonpitting edema  PT with  support socks   Musculoskeletal:  No deformities, BUE and BLE strength normal and equal Skin: warm and dry  Neuro:  CNs 2-12 intact, no focal abnormalities noted Psych:  Normal affect    EKG:  The ECG that was done 7/21was personally reviewed and demonstrates SR 99 bpm  Occasional PAC   Nonspecific ST T wave changes    Relevant CV Studies: TEE  Oct 2019  Indications:      Aortic stenosis 424.1.  ------------------------------------------------------------------- History:   PMH:  Aortic valve replacement with tissue graft. Hypertensive heart disease with heart failure.  Aortic valve disease.  ------------------------------------------------------------------- Study Conclusions  - Left ventricle: There was severe focal basal hypertrophy of the   septum. Systolic function was vigorous. The estimated ejection   fraction was in the range of 65% to 70%. Dynamic obstruction   cannot be excluded. Wall motion was normal; there were no   regional wall motion abnormalities. The outflow tract showed   muscular obstruction with a peak resting gradient of 52 mmHg. - Aortic valve: A prosthesis was present and functioning normally.   The prosthesis had a normal range of motion. The sewing ring   appeared normal, had no rocking motion, and showed no evidence of   dehiscence. - Mitral valve: There was mild regurgitation. - Left atrium: No evidence of thrombus in the atrial cavity or   appendage. - Right ventricle: Systolic function was normal. - Right atrium: No evidence of thrombus in the atrial cavity or   appendage. - Atrial septum: There was a patent foramen ovale. - Tricuspid valve: There was trivial regurgitation.  Impressions:  - Images show normal function of bioprosthetic aortic valve.   Leaflets not fully visualized but appear to open well. Color   doppler shows open flow into valve orifice.     There is severe basal septal hypertrophy with what appears to be   LVOT  obstruction, with a peak resting gradient of 52 mmHg. Due to   sedation, provocation maneuvers such as valsalva were unable to   be performed.     TEE suggests normal aortic valve function with LVOT gradient.  ------------------------------------------------------------------- Study data:   Procedure:  Initial setup. The patient was brought to the laboratory. Surface ECG leads were monitored. Sedation. Conscious sedation was administered by cardiology staff. Transesophageal echocardiography. An adult multiplane transesophageal probe was inserted by the attending cardiologistwithout difficulty. Image quality was adequate.  Study completion:  The patient tolerated the procedure well. There were no complications.  Administered medications:   Midazolam, 4mg , IV. Fentanyl, 40mcg, IV.          Diagnostic transesophageal echocardiography.  2D and color Doppler.  Birthdate:  Patient birthdate: [birth year].  Age:  Patient is 63 yr old.  Sex:  Gender: female.    BMI: 53.3 kg/m^2.  Blood pressure:     128/74  Study date:  Study date: 07/01/2018. Study time: 09:16 AM.  -------------------------------------------------------------------  ------------------------------------------------------------------- Left ventricle:  There was severe focal basal hypertrophy of the septum. Systolic function was vigorous. The estimated ejection fraction was in the range of 65% to 70%. Dynamic obstruction cannot be excluded. Wall motion was normal; there were no regional wall motion abnormalities. The outflow tract showed muscular obstruction with a peak resting gradient of 52 mmHg.  ------------------------------------------------------------------- Aortic valve:   Normal thickness leaflets. A prosthesis was present and functioning normally. The prosthesis had a normal range of motion. The sewing ring appeared normal, had no rocking motion, and showed no evidence of dehiscence. Cusp separation was  normal. Doppler:  There was no significant regurgitation.  ------------------------------------------------------------------- Aorta:  There was mild atheromatous plaque in the descending aorta. There was no evidence  for dissection. Aortic root: The aortic root was not dilated. Ascending aorta: The ascending aorta was normal in size. Descending aorta: The descending aorta was normal in size.  ------------------------------------------------------------------- Mitral valve:   Structurally normal valve.   Leaflet separation was normal.  Doppler:  There was mild regurgitation.  ------------------------------------------------------------------- Left atrium:  The atrium was normal in size.  No evidence of thrombus in the atrial cavity or appendage. The appendage was morphologically a left appendage, multilobulated, and of normal size. Emptying velocity was normal.  ------------------------------------------------------------------- Atrial septum:  There was a patent foramen ovale.  ------------------------------------------------------------------- Right ventricle:  The cavity size was normal. Wall thickness was normal. Systolic function was normal.  ------------------------------------------------------------------- Pulmonic valve:    Structurally normal valve.    Doppler:  There was no significant regurgitation.  ------------------------------------------------------------------- Tricuspid valve:   Structurally normal valve.   Leaflet separation was normal.  Doppler:  There was trivial regurgitation.  ------------------------------------------------------------------- Pulmonary artery:   The main pulmonary artery was normal-sized.  ------------------------------------------------------------------- Right atrium:  The atrium was normal in size.  No evidence of thrombus in the atrial cavity or appendage. The appendage was morphologically a right  appendage.  ------------------------------------------------------------------- Pericardium:  There was no pericardial effusion.   ------------------------------------------------------------------- Prepared and Electronically Authenticated by  Buford Dresser 2019-10-23T16:38:  Transthoracic echo   06/11/18  ------------------------------------------------------------------- Study Conclusions  - Left ventricle: The cavity size was normal. Wall thickness was   increased in a pattern of moderate LVH. Systolic function was   vigorous. The estimated ejection fraction was in the range of 65%   to 70%. Wall motion was normal; there were no regional wall   motion abnormalities. Features are consistent with a pseudonormal   left ventricular filling pattern, with concomitant abnormal   relaxation and increased filling pressure (grade 2 diastolic   dysfunction). - Aortic valve: A bioprosthesis was present. There was moderate   stenosis. Valve area (VTI): 1.2 cm^2. Valve area (Vmax): 1.22   cm^2. Valve area (Vmean): 1.24 cm^2.  Impressions:  - Normal LVEF.   Moderate LVH.   Bioprostetic AV wirh moderate stenosis: Mean/Peak 32/58 mmHg, AVA   1.2 cm2. Dimensionless index 0.43  ------------------------------------------------------------------- Study data:  No prior study was available for comparison.  Study status:  Routine.  Procedure:  The patient reported no pain pre or post test. Transthoracic echocardiography. Image quality was adequate.  Study completion:  There were no complications. Transthoracic echocardiography.  M-mode, complete 2D, spectral Doppler, and color Doppler.  Birthdate:  Patient birthdate: Jul 21, 1956.  Age:  Patient is 63 yr old.  Sex:  Gender: female. BMI: 52.4 kg/m^2.  Blood pressure:     122/80  Patient status: Outpatient.  Study date:  Study date: 06/11/2018. Study time: 08:30 AM.  Location:  Echo  laboratory.  -------------------------------------------------------------------  ------------------------------------------------------------------- Left ventricle:  The cavity size was normal. Wall thickness was increased in a pattern of moderate LVH. Systolic function was vigorous. The estimated ejection fraction was in the range of 65% to 70%. Wall motion was normal; there were no regional wall motion abnormalities. Features are consistent with a pseudonormal left ventricular filling pattern, with concomitant abnormal relaxation and increased filling pressure (grade 2 diastolic dysfunction).  ------------------------------------------------------------------- Aortic valve:   Normal thickness leaflets. A bioprosthesis was present. Mobility was not restricted.  Doppler:   There was moderate stenosis.      VTI ratio of LVOT to aortic valve: 0.42. Valve area (VTI): 1.2 cm^2. Indexed valve area (VTI): 0.49 cm^2/m^2. Peak velocity ratio  of LVOT to aortic valve: 0.43. Valve area (Vmax): 1.22 cm^2. Indexed valve area (Vmax): 0.49 cm^2/m^2. Mean velocity ratio of LVOT to aortic valve: 0.44. Valve area (Vmean): 1.24 cm^2. Indexed valve area (Vmean): 0.5 cm^2/m^2. Mean gradient (S): 32 mm Hg. Peak gradient (S): 58 mm Hg.  ------------------------------------------------------------------- Aorta:  Aortic root: The aortic root was normal in size.  ------------------------------------------------------------------- Mitral valve:   Structurally normal valve.   Mobility was not restricted.  Doppler:  Transvalvular velocity was within the normal range. There was no evidence for stenosis. There was no regurgitation.    Valve area by pressure half-time: 3.93 cm^2. Indexed valve area by pressure half-time: 1.59 cm^2/m^2.    Peak gradient (D): 6 mm Hg.  ------------------------------------------------------------------- Left atrium:  The atrium was normal in  size.  ------------------------------------------------------------------- Right ventricle:  The cavity size was normal. Wall thickness was normal. Systolic function was normal.  ------------------------------------------------------------------- Pulmonic valve:    Structurally normal valve.   Cusp separation was normal.  Doppler:  Transvalvular velocity was within the normal range. There was no evidence for stenosis. There was no regurgitation.  ------------------------------------------------------------------- Tricuspid valve:   Structurally normal valve.    Doppler: Transvalvular velocity was within the normal range. There was trivial regurgitation.  ------------------------------------------------------------------- Pulmonary artery:   The main pulmonary artery was normal-sized. Systolic pressure was within the normal range.  ------------------------------------------------------------------- Right atrium:  The atrium was normal in size.  ------------------------------------------------------------------- Pericardium:  There was no pericardial effusion.  ------------------------------------------------------------------- Systemic veins: Inferior vena cava: The vessel was normal in size.  ------------------------------------------------------------------- Measurements   Left ventricle                           Value          Reference  LV ID, ED, PLAX chordal          (L)     40    mm       43 - 52  LV ID, ES, PLAX chordal                  28    mm       23 - 38  LV fx shortening, PLAX chordal           30    %        >=29  LV PW thickness, ED                      16    mm       ----------  IVS/LV PW ratio, ED                      1.06           <=1.3  Stroke volume, 2D                        109   ml       ----------  Stroke volume/bsa, 2D                    44    ml/m^2   ----------  LV e&', lateral                           8.92  cm/s     ----------  LV  E/e&', lateral  14.13          ----------  LV e&', medial                            7.07  cm/s     ----------  LV E/e&', medial                          17.82          ----------  LV e&', average                           8     cm/s     ----------  LV E/e&', average                         15.76          ----------    Ventricular septum                       Value          Reference  IVS thickness, ED                        17    mm       ----------    LVOT                                     Value          Reference  LVOT ID, S                               19    mm       ----------  LVOT area                                2.84  cm^2     ----------  LVOT peak velocity, S                    163   cm/s     ----------  LVOT mean velocity, S                    115   cm/s     ----------  LVOT VTI, S                              38.5  cm       ----------  LVOT peak gradient, S                    11    mm Hg    ----------    Aortic valve                             Value          Reference  Aortic valve peak velocity, S            380   cm/s     ----------  Aortic valve mean velocity, S            263   cm/s     ----------  Aortic valve VTI, S                      91.1  cm       ----------  Aortic mean gradient, S                  32    mm Hg    ----------  Aortic peak gradient, S                  58    mm Hg    ----------  VTI ratio, LVOT/AV                       0.42           ----------  Aortic valve area, VTI                   1.2   cm^2     ----------  Aortic valve area/bsa, VTI               0.49  cm^2/m^2 ----------  Velocity ratio, peak, LVOT/AV            0.43           ----------  Aortic valve area, peak velocity         1.22  cm^2     ----------  Aortic valve area/bsa, peak              0.49  cm^2/m^2 ----------  velocity  Velocity ratio, mean, LVOT/AV            0.44           ----------  Aortic valve area, mean velocity         1.24  cm^2      ----------  Aortic valve area/bsa, mean              0.5   cm^2/m^2 ----------  velocity    Aorta                                    Value          Reference  Aortic root ID, ED                       34    mm       ----------  Ascending aorta ID, A-P, S               37    mm       ----------    Left atrium                              Value          Reference  LA ID, A-P, ES                           48    mm       ----------  LA ID/bsa, A-P  1.94  cm/m^2   <=2.2  LA volume, S                             65.3  ml       ----------  LA volume/bsa, S                         26.4  ml/m^2   ----------  LA volume, ES, 1-p A4C                   52.8  ml       ----------  LA volume/bsa, ES, 1-p A4C               21.4  ml/m^2   ----------  LA volume, ES, 1-p A2C                   69.3  ml       ----------  LA volume/bsa, ES, 1-p A2C               28    ml/m^2   ----------    Mitral valve                             Value          Reference  Mitral E-wave peak velocity              126   cm/s     ----------  Mitral A-wave peak velocity              72    cm/s     ----------  Mitral deceleration time                 190   ms       150 - 230  Mitral pressure half-time                56    ms       ----------  Mitral peak gradient, D                  6     mm Hg    ----------  Mitral E/A ratio, peak                   1.8            ----------  Mitral valve area, PHT, DP               3.93  cm^2     ----------  Mitral valve area/bsa, PHT, DP           1.59  cm^2/m^2 ----------    Pulmonary arteries                       Value          Reference  PA pressure, S, DP                       26    mm Hg    <=30    Tricuspid valve                          Value          Reference  Tricuspid regurg peak velocity           215   cm/s     ----------  Tricuspid peak RV-RA gradient            18    mm Hg    ----------    Right atrium                             Value           Reference  RA ID, S-I, ES, A4C              (H)     66.4  mm       34 - 49  RA area, ES, A4C                 (H)     21.4  cm^2     8.3 - 19.5  RA volume, ES, A/L                       57.1  ml       ----------  RA volume/bsa, ES, A/L                   23.1  ml/m^2   ----------    Systemic veins                           Value          Reference  Estimated CVP                            8     mm Hg    ----------    Right ventricle                          Value          Reference  TAPSE                                    17.5  mm       ----------  RV pressure, S, DP                       26    mm Hg    <=30  RV s&', lateral, S                        15.8  cm/s     ----------  Legend: (L)  and  (H)  mark values outside specified reference range.  ------------------------------------------------------------------- Prepared and Electronically Authenticated by  Jenne Campus, MD 2019-10-03T11:55:48  Laboratory Data:  High Sensitivity Troponin:   Recent Labs  Lab 03/30/19 1335 03/30/19 1522  TROPONINIHS 108* 114*      Cardiac EnzymesNo results for input(s): TROPONINI in the last 168 hours. No results for input(s): TROPIPOC in the last 168 hours.  Chemistry Recent Labs  Lab 03/30/19 1335  NA 135  K 3.0*  CL 91*  CO2 32  GLUCOSE 177*  BUN 14  CREATININE 1.28*  CALCIUM 10.0  GFRNONAA 44*  GFRAA 51*  ANIONGAP 12    No results for input(s): PROT, ALBUMIN, AST,  ALT, ALKPHOS, BILITOT in the last 168 hours. Hematology Recent Labs  Lab 03/30/19 1335  WBC 15.2*  RBC 4.20  HGB 9.4*  HCT 33.2*  MCV 79.0*  MCH 22.4*  MCHC 28.3*  RDW 19.3*  PLT 360   BNPNo results for input(s): BNP, PROBNP in the last 168 hours.  DDimer  Recent Labs  Lab 03/30/19 1400  DDIMER 1.36*     Radiology/Studies:  Dg Chest 2 View  Result Date: 03/30/2019 CLINICAL DATA:  Chest pressure and shortness of breath for 2 days EXAM: CHEST - 2 VIEW COMPARISON:  03/12/2013 FINDINGS:  Cardiac shadow is enlarged but stable. Postsurgical changes are noted. Lungs are well aerated without focal infiltrate or sizable effusion. No acute bony abnormality is seen. IMPRESSION: Postsurgical changes consistent with history of aortic valve replacement. No acute abnormality noted. Electronically Signed   By: Inez Catalina M.D.   On: 03/30/2019 15:12   Ct Angio Chest Pe W And/or Wo Contrast  Result Date: 03/30/2019 CLINICAL DATA:  Abnormal EKG, chest pressure. PE suspected, high pretest probability. EXAM: CT ANGIOGRAPHY CHEST WITH CONTRAST TECHNIQUE: Multidetector CT imaging of the chest was performed using the standard protocol during bolus administration of intravenous contrast. Multiplanar CT image reconstructions and MIPs were obtained to evaluate the vascular anatomy. CONTRAST:  172mL OMNIPAQUE IOHEXOL 350 MG/ML SOLN COMPARISON:  None. FINDINGS: Cardiovascular: There is no pulmonary embolism identified within the main, lobar or central segmental pulmonary arteries bilaterally. No thoracic aortic aneurysm or evidence of aortic dissection. Aortic valve hardware in place. Cardiomegaly. No pericardial effusion. Mediastinum/Nodes: No mass or enlarged lymph nodes seen within the mediastinum or perihilar regions. Focal hyperdense material within the lower esophagus, of uncertain etiology, most suggestive of oral contrast material. Esophagus otherwise unremarkable. Trachea and central bronchi are unremarkable. Lungs/Pleura: Lungs are clear. No pleural effusion or pneumothorax. Upper Abdomen: Limited images of the upper abdomen are unremarkable. Musculoskeletal: No acute or suspicious osseous finding. Mild degenerative spondylosis throughout the slightly scoliotic thoracic spine. Median sternotomy wires in place. Review of the MIP images confirms the above findings. IMPRESSION: 1. No pulmonary embolism seen. 2. No pneumonia or pulmonary edema. 3. Cardiomegaly. No pericardial effusion. 4. Focal hyperdense  material within the lower esophagus, most suggestive of oral contrast material. Has patient had recent oral contrast for an outside study? Otherwise, of uncertain etiology. Esophagus appears otherwise unremarkable. Electronically Signed   By: Franki Cabot M.D.   On: 03/30/2019 17:30    Assessment and Plan:   1  CHest pressure/ chest pain.   Pt with mild elevation in troponin   May represent coronary ischemia though may also represent subendocardial ischemia in setting of hyperdynamic LV with LVOT obstruction Currently, patient comfortable    ON heparin   WOuld continue to track trop trends Further testing based on trends  Will review TEE/TTE  2  Dyspnea   May be related to LVOT obstruction.   I am concerned that [place] may make obstruction worse with decreased preload  Will hold for now Pt also morbidly obese and deconditioned May be multifactoral   Needs to watch salt intake    3  HTN   Follow on changes in medicines  4   Hx aortic stenosis   Pt is s/p AVR  Last echo in 2019 prosthesis operating well  5  OSA   FOllowed by Corky Downs for OSA   Pt with erratic sleep schedules  SHe was last seen in [facility]inc in May   Titrations made  in setting     6  HL   Continue meds  7   Hx lymphedema with chronic ulcers   No active wounds   She plans to be followed at [facility]  8  Hx menorrhagia   Currently on hormone therapy     WIll hold until cardiac status clarified.    Eventual plan is to have D &C  9   Morbid obesity   Discussed wt loss   WIll be difficult   Severity of Illness: The appropriate patient status for this patient is OBSERVATION. Observation status is judged to be reasonable and necessary in order to provide the required intensity of service to ensure the patient's safety. The patient's presenting symptoms, physical exam findings, and initial radiographic and laboratory data in the context of their medical condition is felt to place them at decreased risk for further clinical deterioration.  Furthermore, it is anticipated that the patient will be medically stable for discharge from the hospital within 2 midnights of admission. The following factors support the patient status of observation.     For questions or updates, please contact [place] Please consult [email or web address] for contact info under        Signed, Dorris Carnes, MD  03/30/2019 6:19 PM

## 2019-03-30 NOTE — ED Provider Notes (Signed)
[facility] EMERGENCY DEPARTMENT Provider Note   CSN: [id number] Arrival date & time: 03/30/19  1326     History   Chief Complaint Chief Complaint  Patient presents with  . Abnormal ECG    HPI [name] is a 63 y.o. female.     HPI  63 year old female presents with chest pain and abnormal ECG.  Sent over from her doctor's office.  She developed chest pain last night and took Tums for it with partial but not full relief.  Tried Tums again this morning without relief.  The patient states the chest pain feels like a pressure and she is also had some pain at her right shoulder blade.  Some worsening shortness of breath than typical.  No real cough or fever.  Chronic lymphedema but no new leg pain or swelling or redness.  She has had some heavy vaginal bleeding over the weekend and is due for a D&C but has not happened yet. Chest pressure is currently about a 2.  Past Medical History:  Diagnosis Date  . Aortic stenosis 03/23/2013   Overview:  02/18/13 TTE EF >55%. Critical AS with mean Ao valve gradient of 82 mm Hg. No AI. No MR, PR, mild TR. Estimated RVSP 30 mm Hg.  . Bicuspid aortic valve   . Edema of both legs 02/20/2017  . 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 Heart failure ([place])   . HTN (hypertension) 03/23/2013  . 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 Morbid obesity ([place]) 02/20/2017    Patient Active Problem List   Diagnosis Date Noted  . Sleep apnea 09/25/2018  . At risk for obstructive sleep apnea 08/14/2018  . 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 Dysfunctional uterine bleeding 04/22/2018  . Edema of both legs 02/20/2017  . 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 Morbid obesity ([place]) 02/20/2017  . Aortic stenosis 03/23/2013  . Hypertensive heart disease with heart failure ([place]) 03/23/2013    Past Surgical History:  Procedure Laterality Date  . TEE WITHOUT  CARDIOVERSION N/A 07/01/2018   Procedure: TRANSESOPHAGEAL ECHOCARDIOGRAM (TEE);  Surgeon: Buford Dresser, MD;  Location[facility] ENDOSCOPY;  Service: Cardiovascular;  Laterality: N/A;  . TISSUE AORTIC VALVE REPLACEMENT       OB History   No obstetric history on file.      Home Medications    Prior to Admission medications   Medication Sig Start Date End Date Taking? Authorizing Provider  aspirin EC 81 MG tablet Take 81 mg by mouth daily.   Yes [provider]  metolazone (ZAROXOLYN) 2.5 MG tablet Take 1 tablet (2.5 mg total) by mouth once a week. Patient taking differently: Take 2.5 mg by mouth once a week. Monday 03/23/19 06/21/19 Yes Richardo Priest, MD  metoprolol tartrate (LOPRESSOR) 50 MG tablet Take 1 tablet by mouth twice daily 03/23/19  Yes Munley, Hilton Cork, MD  norethindrone (AYGESTIN) 5 MG tablet Take 5 mg by mouth 2 (two) times daily.    Yes [provider]  pravastatin (PRAVACHOL) 20 MG tablet Take 20 mg by mouth at bedtime.    Yes [provider]  telmisartan (MICARDIS) 20 MG tablet Take 1 tablet (20 mg total) by mouth daily. 03/23/19  Yes Richardo Priest, MD  torsemide (DEMADEX) 20 MG tablet Take 1 tablet (20 mg)  by mouth 2x's daily. Take 1 additional tablet daily for if weight is 280 lbs or greater. Patient taking differently:  Take 20 mg by mouth 2 (two) times daily. Take 1 additional tablet daily for if weight is 288 lbs or greater. 02/09/19  Yes Richardo Priest, MD    Family History Family History  Problem Relation Age of Onset  . Parkinson's disease Father   . Heart attack Mother     Social History Social History   Tobacco Use  . Smoking status: Never Smoker  . Smokeless tobacco: Never Used  Substance Use Topics  . Alcohol use: No    Frequency: Never  . Drug use: No     Allergies   Patient has no known allergies.   Review of Systems Review of Systems  Constitutional: Negative for fever.  Respiratory: Positive for  shortness of breath. Negative for cough.   Cardiovascular: Positive for chest pain and leg swelling (chronic).  Genitourinary: Positive for vaginal bleeding.  All other systems reviewed and are negative.    Physical Exam Updated Vital Signs BP (!) 147/53 (BP Location: Right Arm)   Temp 98 F (36.7 C)   Resp 19   Ht 5' (1.524 m)   Wt 130.6 kg   LMP  (Exact Date)   SpO2 94%   BMI 56.23 kg/m   Physical Exam Vitals signs and nursing note reviewed.  Constitutional:      General: She is not in acute distress.    Appearance: She is well-developed. She is obese. She is not ill-appearing or diaphoretic.  HENT:     Head: Normocephalic and atraumatic.     Right Ear: External ear normal.     Left Ear: External ear normal.     Nose: Nose normal.  Eyes:     General:        Right eye: No discharge.        Left eye: No discharge.  Cardiovascular:     Rate and Rhythm: Regular rhythm. Tachycardia present.     Heart sounds: Normal heart sounds.     Comments: HR~100 Pulmonary:     Effort: Pulmonary effort is normal.     Breath sounds: Normal breath sounds.  Chest:     Chest wall: No tenderness.  Abdominal:     Palpations: Abdomen is soft.     Tenderness: There is no abdominal tenderness.  Musculoskeletal:     Comments: Chronic appearing BLE edema, has compression stockings on  Skin:    General: Skin is warm and dry.  Neurological:     Mental Status: She is alert.  Psychiatric:        Mood and Affect: Mood is not anxious.      ED Treatments / Results  Labs (all labs ordered are listed, but only abnormal results are displayed) Labs Reviewed  BASIC METABOLIC PANEL - Abnormal; Notable for the following components:      Result Value   Potassium 3.0 (*)    Chloride 91 (*)    Glucose, Bld 177 (*)    Creatinine, Ser 1.28 (*)    GFR calc non Af Amer 44 (*)    GFR calc Af Amer 51 (*)    All other components within normal limits  CBC - Abnormal; Notable for the following  components:   WBC 15.2 (*)    Hemoglobin 9.4 (*)    HCT 33.2 (*)    MCV 79.0 (*)    MCH 22.4 (*)    MCHC 28.3 (*)    RDW 19.3 (*)    All other components within normal limits  D-DIMER,  QUANTITATIVE (NOT AT [facility]) - Abnormal; Notable for the following components:   D-Dimer, Quant 1.36 (*)    All other components within normal limits  TROPONIN I (HIGH SENSITIVITY) - Abnormal; Notable for the following components:   Troponin I (High Sensitivity) 108 (*)    All other components within normal limits  SARS CORONAVIRUS 2 (HOSPITAL ORDER, PERFORMED IN [facility])  MAGNESIUM  HEPARIN LEVEL (UNFRACTIONATED)  TROPONIN I (HIGH SENSITIVITY)    EKG EKG Interpretation  Date/Time:  Tuesday March 30 2019 13:45:40 EDT Ventricular Rate:  99 PR Interval:  162 QRS Duration: 88 QT Interval:  364 QTC Calculation: 467 R Axis:   11 Text Interpretation:  Sinus rhythm with Premature atrial complexes Nonspecific ST and T wave abnormality Abnormal ECG No old tracing to compare Confirmed by Sherwood Gambler [phone]) on 03/30/2019 2:01:16 PM   Radiology Dg Chest 2 View  Result Date: 03/30/2019 CLINICAL DATA:  Chest pressure and shortness of breath for 2 days EXAM: CHEST - 2 VIEW COMPARISON:  03/12/2013 FINDINGS: Cardiac shadow is enlarged but stable. Postsurgical changes are noted. Lungs are well aerated without focal infiltrate or sizable effusion. No acute bony abnormality is seen. IMPRESSION: Postsurgical changes consistent with history of aortic valve replacement. No acute abnormality noted. Electronically Signed   By: Inez Catalina M.D.   On: 03/30/2019 15:12    Procedures .Critical Care Performed by: Sherwood Gambler, MD Authorized by: Sherwood Gambler, MD   Critical care provider statement:    Critical care time (minutes):  35   Critical care time was exclusive of:  Separately billable procedures and treating other patients   Critical care was necessary to treat or prevent imminent or  life-threatening deterioration of the following conditions:  Circulatory failure and respiratory failure   Critical care was time spent personally by me on the following activities:  Discussions with consultants, evaluation of patient's response to treatment, examination of patient, ordering and performing treatments and interventions, ordering and review of laboratory studies, ordering and review of radiographic studies, pulse oximetry, re-evaluation of patient's condition, obtaining history from patient or surrogate and review of old charts   (including critical care time)  Medications Ordered in ED Medications  nitroGLYCERIN (NITROSTAT) SL tablet 0.4 mg (0.4 mg Sublingual Given 03/30/19 1524)  heparin bolus via infusion 4,000 Units (has no administration in time range)  heparin ADULT infusion 100 units/mL (25000 units/243mL sodium chloride 0.45%) (has no administration in time range)  aspirin chewable tablet 324 mg (324 mg Oral Given 03/30/19 1500)  potassium chloride SA (K-DUR) CR tablet 40 mEq (40 mEq Oral Given 03/30/19 1523)     Initial Impression / Assessment and Plan / ED Course  I have reviewed the triage vital signs and the nursing notes.  Pertinent labs & imaging results that were available during my care of the patient were reviewed by me and considered in my medical decision making (see chart for details).        Patient's chest pain is pretty mild but still present.  Her ECG changes are subtle but definitely present in V4 through 6 compared to 2019.  Could be PE given the tachycardia and obesity and so CT angiography will be obtained.  She will be started on heparin as her troponin is above 100 and thus concerning for ACS if not PE.  Care transferred to Dr. Maryan Rued.  Will give nitroglycerin for some hypertension and associated with the chest pressure to see if this relieves her pain.  [name] was evaluated in Emergency Department on 03/30/2019 for the symptoms described  in the history of present illness. She was evaluated in the context of the global COVID-19 pandemic, which necessitated consideration that the patient might be at risk for infection with the SARS-CoV-2 virus that causes COVID-19. Institutional protocols and algorithms that pertain to the evaluation of patients at risk for COVID-19 are in a state of rapid change based on information released by regulatory bodies including the CDC and federal and state organizations. These policies and algorithms were followed during the patient's care in the ED.   Final Clinical Impressions(s) / ED Diagnoses   Final diagnoses:  None    ED Discharge Orders    None       Sherwood Gambler, MD 03/30/19 [phone]

## 2019-03-30 NOTE — ED Provider Notes (Signed)
Patient's CTA is negative for PE.  Initial troponin is 108 and repeat is 114.  Given patient's new chest pain since last night, subtle EKG changes and troponin greater than 100 patient was started on heparin drip and will admit for further evaluation.   Blanchie Dessert, MD 03/30/19 [phone]

## 2019-03-31 ENCOUNTER — Encounter (HOSPITAL_COMMUNITY): Payer: Self-pay | Admitting: General Practice

## 2019-03-31 ENCOUNTER — Observation Stay (HOSPITAL_BASED_OUTPATIENT_CLINIC_OR_DEPARTMENT_OTHER): Payer: BC Managed Care – PPO

## 2019-03-31 DIAGNOSIS — I340 Nonrheumatic mitral (valve) insufficiency: Secondary | ICD-10-CM

## 2019-03-31 DIAGNOSIS — R0600 Dyspnea, unspecified: Secondary | ICD-10-CM | POA: Diagnosis not present

## 2019-03-31 DIAGNOSIS — R079 Chest pain, unspecified: Secondary | ICD-10-CM | POA: Diagnosis not present

## 2019-03-31 LAB — BRAIN NATRIURETIC PEPTIDE: B Natriuretic Peptide: 95.3 pg/mL (ref 0.0–100.0)

## 2019-03-31 LAB — ECHOCARDIOGRAM COMPLETE
Height: 60 in
Weight: 4402.15 oz

## 2019-03-31 LAB — CBC
HCT: 32.3 % — ABNORMAL LOW (ref 36.0–46.0)
Hemoglobin: 9.3 g/dL — ABNORMAL LOW (ref 12.0–15.0)
MCH: 22.4 pg — ABNORMAL LOW (ref 26.0–34.0)
MCHC: 28.8 g/dL — ABNORMAL LOW (ref 30.0–36.0)
MCV: 77.6 fL — ABNORMAL LOW (ref 80.0–100.0)
Platelets: 325 10*3/uL (ref 150–400)
RBC: 4.16 MIL/uL (ref 3.87–5.11)
RDW: 19.3 % — ABNORMAL HIGH (ref 11.5–15.5)
WBC: 13.1 10*3/uL — ABNORMAL HIGH (ref 4.0–10.5)
nRBC: 0 % (ref 0.0–0.2)

## 2019-03-31 LAB — BASIC METABOLIC PANEL
Anion gap: 11 (ref 5–15)
BUN: 14 mg/dL (ref 8–23)
CO2: 29 mmol/L (ref 22–32)
Calcium: 9.4 mg/dL (ref 8.9–10.3)
Chloride: 95 mmol/L — ABNORMAL LOW (ref 98–111)
Creatinine, Ser: 1.15 mg/dL — ABNORMAL HIGH (ref 0.44–1.00)
GFR calc Af Amer: 58 mL/min — ABNORMAL LOW (ref >60)
GFR calc non Af Amer: 50 mL/min — ABNORMAL LOW (ref >60)
Glucose, Bld: 112 mg/dL — ABNORMAL HIGH (ref 70–99)
Potassium: 3.2 mmol/L — ABNORMAL LOW (ref 3.5–5.1)
Sodium: 135 mmol/L (ref 135–145)

## 2019-03-31 LAB — HEPARIN LEVEL (UNFRACTIONATED)
Heparin Unfractionated: 0.17 IU/mL — ABNORMAL LOW (ref 0.30–0.70)
Heparin Unfractionated: 0.24 IU/mL — ABNORMAL LOW (ref 0.30–0.70)
Heparin Unfractionated: 0.22 IU/mL — ABNORMAL LOW (ref 0.30–0.70)

## 2019-03-31 LAB — LIPID PANEL
Cholesterol: 112 mg/dL (ref 0–200)
HDL: 22 mg/dL — ABNORMAL LOW (ref >40)
LDL Cholesterol: 57 mg/dL (ref 0–99)
Total CHOL/HDL Ratio: 5.1 RATIO
Triglycerides: 166 mg/dL — ABNORMAL HIGH (ref <150)
VLDL: 33 mg/dL (ref 0–40)

## 2019-03-31 LAB — HIV ANTIBODY (ROUTINE TESTING W REFLEX): HIV Screen 4th Generation wRfx: NONREACTIVE

## 2019-03-31 MED ORDER — SODIUM CHLORIDE 0.9 % IV SOLN: 250.0000 mL | INTRAVENOUS | Status: DC | PRN

## 2019-03-31 MED ORDER — ASPIRIN 81 MG PO CHEW
81.0000 mg | CHEWABLE_TABLET | ORAL | Status: AC
  Administered 2019-04-01: 81 mg via ORAL
  Filled 2019-03-31: qty 1

## 2019-03-31 MED ORDER — NORETHINDRONE ACETATE 5 MG PO TABS
5.0000 mg | ORAL_TABLET | Freq: Two times a day (BID) | ORAL | Status: DC
  Administered 2019-03-31 – 2019-04-02 (×4): 5 mg via ORAL
  Filled 2019-03-31 (×5): qty 1

## 2019-03-31 MED ORDER — SODIUM CHLORIDE 0.9% FLUSH: 3.0000 mL | INTRAVENOUS | Status: DC | PRN

## 2019-03-31 MED ORDER — SODIUM CHLORIDE 0.9 % IV SOLN
INTRAVENOUS | Status: DC
  Administered 2019-04-01: 09:00:00 via INTRAVENOUS

## 2019-03-31 MED ORDER — SODIUM CHLORIDE 0.9% FLUSH
3.0000 mL | Freq: Two times a day (BID) | INTRAVENOUS | Status: DC
  Administered 2019-03-31: 3 mL via INTRAVENOUS

## 2019-03-31 MED ORDER — HEPARIN BOLUS VIA INFUSION
2000.0000 [IU] | Freq: Once | INTRAVENOUS | Status: AC
  Administered 2019-03-31: 2000 [IU] via INTRAVENOUS
  Filled 2019-03-31: qty 2000

## 2019-03-31 NOTE — Progress Notes (Signed)
Patient has uterine bleeding.  This is a chronic problem for her, from fibroids.  She takes norethindrone chronically for this.   As this is a progesterone only medication, will reorder this.   Rosaria Ferries, PA-C 03/31/2019 7:08 PM Beeper [phone]

## 2019-03-31 NOTE — Progress Notes (Signed)
I have reviewed echo images    Pt has severe symmetric LVH   ? Related to previous AS vs hypertrophic CM   LV is hyperdynamic with near cavity obliteration   AV is thickened.Peak gradient  through the LV/AV is 26 mm Hg      OVer the past 6 months the patient has had progressive dyspnea on exertion.   Presented to ED with CP     Trop were mildly icnreased   (in setting of hypertrophic LV) Only notes for cath in 2014 was a progress note that said no signficant CAD     I discussed with pt   I do think measuring filling pressures is important    She was on diuretic which I have held    I would also recomm coronary angiography to r/o the development of signif CAD    WIll review with interventional service in AM   Findings will help guide outpt medical therapy  Dorris Carnes MD

## 2019-03-31 NOTE — Progress Notes (Signed)
[facility] for Heparin Indication: ACS/STEMI  No Known Allergies  Patient Measurements: Height: 5' (152.4 cm) Weight: 275 lb 2.2 oz (124.8 kg) IBW/kg (Calculated) : 45.5 Heparin Dosing Weight: 79 kg  Vital Signs: Temp: 98.4 F (36.9 C) (07/22 0555) Temp Source: Oral (07/22 0555) BP: 122/66 (07/22 0555) Pulse Rate: 80 (07/22 0555)  Labs: Recent Labs    03/30/19 1335 03/30/19 1522 03/30/19 2045 03/30/19 2156 03/31/19 0308 03/31/19 0826  HGB 9.4*  --   --   --  9.3*  --   HCT 33.2*  --   --   --  32.3*  --   PLT 360  --   --   --  325  --   HEPARINUNFRC  --   --   --  0.11* 0.17* 0.24*  CREATININE 1.28*  --  1.17*  --  1.15*  --   TROPONINIHS 108* 114* 120* 114*  --   --     Estimated Creatinine Clearance: 60.2 mL/min (A) (by C-G formula based on SCr of 1.15 mg/dL (H)).  Assessment: 63 y.o. female with chest pain. No anticoagulation PTA. Pharmacy has been consulted to dose heparin.  Heparin level (0.24) subtherapeutic after increasing drip rate 1300 units/hr and bolus x2. CBC stable (baseline Hgb ~9-10). No bleeding noted.  Goal of Therapy:  Heparin level 0.3-0.7 units/ml Monitor platelets by anticoagulation protocol: Yes   Plan:  Increase heparin 1500 units/hr Heparin level in 8 hours Daily heparin level and CBC while on heparin Monitor s/sx bleeding  Richardine Service, PharmD PGY1 Pharmacy Resident Phone: [phone] 03/31/2019  9:17 AM  Please check [email or web address] for unit-specific pharmacy phone numbers.

## 2019-03-31 NOTE — Progress Notes (Signed)
Progress Note  Patient Name: [name] Date of Encounter: 03/31/2019  Primary Cardiologist: Bettina Gavia  Subjective   Chest is feeling better   Breathing is better    Inpatient Medications    Scheduled Meds: . aspirin  324 mg Oral NOW   Or  . aspirin  300 mg Rectal NOW  . aspirin EC  81 mg Oral Daily  . irbesartan  75 mg Oral Daily  . metoprolol tartrate  50 mg Oral BID  . pravastatin  20 mg Oral QHS   Continuous Infusions: . heparin 1,300 Units/hr (03/31/19 0020)   PRN Meds: acetaminophen, nitroGLYCERIN, ondansetron (ZOFRAN) IV   Vital Signs    Vitals:   03/30/19 2028 03/30/19 2151 03/30/19 2337 03/31/19 0555  BP: 120/82 (!) 147/60 117/67 122/66  Pulse: 93 100 97 80  Resp: (!) 30 (!) 28  (!) 22  Temp:  99.4 F (37.4 C)  98.4 F (36.9 C)  TempSrc:  Oral  Oral  SpO2: 93% 94%  93%  Weight:    124.8 kg  Height:        Intake/Output Summary (Last 24 hours) at 03/31/2019 0643 Last data filed at 03/31/2019 0221 Gross per 24 hour  Intake 500.99 ml  Output -  Net 500.99 ml   Last 3 Weights 03/31/2019 03/30/2019 03/23/2019  Weight (lbs) 275 lb 2.2 oz 287 lb 14.7 oz 288 lb  Weight (kg) 124.8 kg 130.6 kg 130.636 kg      Telemetry    SR  - Personally Reviewed  ECG    NOne - Personally Reviewed  Physical Exam   GEN: No acute distress.   Neck: No JVD Cardiac: RRRGr II/VI systolic murmur LSB to base  No rubs, or gallops.  Respiratory: Clear to auscultation bilaterally. GI: Soft, nontender, non-distended  MS: No edema; No deformity. Neuro:  Nonfocal  Psych: Normal affect   Labs    High Sensitivity Troponin:   Recent Labs  Lab 03/30/19 1335 03/30/19 1522 03/30/19 2045 03/30/19 2156  TROPONINIHS 108* 114* 120* 114*      Cardiac EnzymesNo results for input(s): TROPONINI in the last 168 hours. No results for input(s): TROPIPOC in the last 168 hours.   Chemistry Recent Labs  Lab 03/30/19 1335 03/30/19 2045 03/31/19 0308  NA 135  --  135  K 3.0*   --  3.2*  CL 91*  --  95*  CO2 32  --  29  GLUCOSE 177*  --  112*  BUN 14  --  14  CREATININE 1.28* 1.17* 1.15*  CALCIUM 10.0  --  9.4  GFRNONAA 44* 49* 50*  GFRAA 51* 57* 58*  ANIONGAP 12  --  11     Hematology Recent Labs  Lab 03/30/19 1335 03/31/19 0308  WBC 15.2* 13.1*  RBC 4.20 4.16  HGB 9.4* 9.3*  HCT 33.2* 32.3*  MCV 79.0* 77.6*  MCH 22.4* 22.4*  MCHC 28.3* 28.8*  RDW 19.3* 19.3*  PLT 360 325    BNP Recent Labs  Lab 03/30/19 2350  BNP 95.3     DDimer  Recent Labs  Lab 03/30/19 1400  DDIMER 1.36*     Radiology    Dg Chest 2 View  Result Date: 03/30/2019 CLINICAL DATA:  Chest pressure and shortness of breath for 2 days EXAM: CHEST - 2 VIEW COMPARISON:  03/12/2013 FINDINGS: Cardiac shadow is enlarged but stable. Postsurgical changes are noted. Lungs are well aerated without focal infiltrate or sizable effusion. No acute bony  abnormality is seen. IMPRESSION: Postsurgical changes consistent with history of aortic valve replacement. No acute abnormality noted. Electronically Signed   By: Inez Catalina M.D.   On: 03/30/2019 15:12   Ct Angio Chest Pe W And/or Wo Contrast  Result Date: 03/30/2019 CLINICAL DATA:  Abnormal EKG, chest pressure. PE suspected, high pretest probability. EXAM: CT ANGIOGRAPHY CHEST WITH CONTRAST TECHNIQUE: Multidetector CT imaging of the chest was performed using the standard protocol during bolus administration of intravenous contrast. Multiplanar CT image reconstructions and MIPs were obtained to evaluate the vascular anatomy. CONTRAST:  135mL OMNIPAQUE IOHEXOL 350 MG/ML SOLN COMPARISON:  None. FINDINGS: Cardiovascular: There is no pulmonary embolism identified within the main, lobar or central segmental pulmonary arteries bilaterally. No thoracic aortic aneurysm or evidence of aortic dissection. Aortic valve hardware in place. Cardiomegaly. No pericardial effusion. Mediastinum/Nodes: No mass or enlarged lymph nodes seen within the  mediastinum or perihilar regions. Focal hyperdense material within the lower esophagus, of uncertain etiology, most suggestive of oral contrast material. Esophagus otherwise unremarkable. Trachea and central bronchi are unremarkable. Lungs/Pleura: Lungs are clear. No pleural effusion or pneumothorax. Upper Abdomen: Limited images of the upper abdomen are unremarkable. Musculoskeletal: No acute or suspicious osseous finding. Mild degenerative spondylosis throughout the slightly scoliotic thoracic spine. Median sternotomy wires in place. Review of the MIP images confirms the above findings. IMPRESSION: 1. No pulmonary embolism seen. 2. No pneumonia or pulmonary edema. 3. Cardiomegaly. No pericardial effusion. 4. Focal hyperdense material within the lower esophagus, most suggestive of oral contrast material. Has patient had recent oral contrast for an outside study? Otherwise, of uncertain etiology. Esophagus appears otherwise unremarkable. Electronically Signed   By: Franki Cabot M.D.   On: 03/30/2019 17:30    Cardiac Studies   Echo ordered  Patient Profile     63 y.o. female with hx of AS (s/p AVR), HTN, morbid obesity, OSA, lymphedema and LE ulcers who presents for chest pain/chest pressure.   Assessment & Plan    1  Chest pressure   Pt currently without discomfort Hx of AV dz (s/p bioprosthetic AV for AS in 2014)   Had cath prior in 2014 that showed normal coronary arteries.    [facility])  [facility] has had progressive SOB for the past several months   CP is new     NOw pain /pressure free    Very mild flat elevationof hsTroponin  Given cath in past, it would be unlikely that she  developed tight coronary artery stenoses   I question instead  if related to filling characterisistic of heart.   Diuretic was new  ? If it is hurting more than helping   Unfort, I am having a hard time reviewing last  transthroacic echo  Anyway, I would repeat anyway to look at LV, LV outflow and AV prosthessi      WOuld hold further testing for now    I have held diuretics for now    May indeed benefit from R heart cath    For questions or updates, please contact [place] Please consult [email or web address] for contact info under        Signed, Dorris Carnes, MD  03/31/2019, 6:43 AM

## 2019-03-31 NOTE — Progress Notes (Signed)
[facility] for Heparin Indication: ACS/STEMI  No Known Allergies  Patient Measurements: Height: 5' (152.4 cm) Weight: 275 lb 2.2 oz (124.8 kg) IBW/kg (Calculated) : 45.5 Heparin Dosing Weight: 79 kg  Labs: Recent Labs    03/30/19 1335 03/30/19 1522 03/30/19 2045  03/30/19 2156 03/31/19 0308 03/31/19 0826 03/31/19 1722  HGB 9.4*  --   --   --   --  9.3*  --   --   HCT 33.2*  --   --   --   --  32.3*  --   --   PLT 360  --   --   --   --  325  --   --   HEPARINUNFRC  --   --   --    < > 0.11* 0.17* 0.24* 0.22*  CREATININE 1.28*  --  1.17*  --   --  1.15*  --   --   TROPONINIHS 108* 114* 120*  --  114*  --   --   --    < > = values in this interval not displayed.    Estimated Creatinine Clearance: 60.2 mL/min (A) (by C-G formula based on SCr of 1.15 mg/dL (H)).  Assessment: 63 y.o. female with chest pain. No anticoagulation PTA. Pharmacy has been consulted to dose heparin.  Heparin level (0.22) subtherapeutic after increasing drip rate 1500 units/hr and bolus x2. CBC stable (baseline Hgb ~9-10). No bleeding noted.  Goal of Therapy:  Heparin level 0.3-0.7 units/ml Monitor platelets by anticoagulation protocol: Yes   Plan:  Increase heparin 1650 units/hr Heparin level in 8 hours Daily heparin level and CBC while on heparin Monitor s/sx bleeding  Acey Lav, PharmD  PGY1 Pharmacy Resident [facility] [phone] 03/31/2019  6:58 PM  Please check [email or web address] for unit-specific pharmacy phone numbers.

## 2019-03-31 NOTE — Progress Notes (Signed)
  Echocardiogram 2D Echocardiogram has been performed.  Bobbye Charleston 03/31/2019, 11:53 AM

## 2019-04-01 ENCOUNTER — Encounter (HOSPITAL_COMMUNITY)
Admission: EM | Disposition: A | Discharge status: Home / Self Care | Payer: Self-pay | Source: Ambulatory Visit | Attending: Internal Medicine

## 2019-04-01 DIAGNOSIS — I249 Acute ischemic heart disease, unspecified: Secondary | ICD-10-CM

## 2019-04-01 HISTORY — PX: RIGHT/LEFT HEART CATH AND CORONARY ANGIOGRAPHY: CATH118266

## 2019-04-01 LAB — POCT I-STAT EG7
Acid-Base Excess: 4 mmol/L — ABNORMAL HIGH (ref 0.0–2.0)
Acid-Base Excess: 5 mmol/L — ABNORMAL HIGH (ref 0.0–2.0)
Bicarbonate: 30.6 mmol/L — ABNORMAL HIGH (ref 20.0–28.0)
Bicarbonate: 31 mmol/L — ABNORMAL HIGH (ref 20.0–28.0)
Calcium, Ion: 1.23 mmol/L (ref 1.15–1.40)
Calcium, Ion: 1.25 mmol/L (ref 1.15–1.40)
HCT: 30 % — ABNORMAL LOW (ref 36.0–46.0)
HCT: 31 % — ABNORMAL LOW (ref 36.0–46.0)
Hemoglobin: 10.2 g/dL — ABNORMAL LOW (ref 12.0–15.0)
Hemoglobin: 10.5 g/dL — ABNORMAL LOW (ref 12.0–15.0)
O2 Saturation: 66 %
O2 Saturation: 70 %
Potassium: 3.3 mmol/L — ABNORMAL LOW (ref 3.5–5.1)
Potassium: 3.3 mmol/L — ABNORMAL LOW (ref 3.5–5.1)
Sodium: 137 mmol/L (ref 135–145)
Sodium: 137 mmol/L (ref 135–145)
TCO2: 32 mmol/L (ref 22–32)
TCO2: 33 mmol/L — ABNORMAL HIGH (ref 22–32)
pCO2, Ven: 54 mmHg (ref 44.0–60.0)
pCO2, Ven: 54.6 mmHg (ref 44.0–60.0)
pH, Ven: 7.361 (ref 7.250–7.430)
pH, Ven: 7.362 (ref 7.250–7.430)
pO2, Ven: 36 mmHg (ref 32.0–45.0)
pO2, Ven: 39 mmHg (ref 32.0–45.0)

## 2019-04-01 LAB — BASIC METABOLIC PANEL
Anion gap: 10 (ref 5–15)
BUN: 19 mg/dL (ref 8–23)
CO2: 29 mmol/L (ref 22–32)
Calcium: 9.2 mg/dL (ref 8.9–10.3)
Chloride: 95 mmol/L — ABNORMAL LOW (ref 98–111)
Creatinine, Ser: 1.33 mg/dL — ABNORMAL HIGH (ref 0.44–1.00)
GFR calc Af Amer: 49 mL/min — ABNORMAL LOW (ref >60)
GFR calc non Af Amer: 42 mL/min — ABNORMAL LOW (ref >60)
Glucose, Bld: 130 mg/dL — ABNORMAL HIGH (ref 70–99)
Potassium: 3.4 mmol/L — ABNORMAL LOW (ref 3.5–5.1)
Sodium: 134 mmol/L — ABNORMAL LOW (ref 135–145)

## 2019-04-01 LAB — POCT I-STAT 7, (LYTES, BLD GAS, ICA,H+H)
Acid-Base Excess: 5 mmol/L — ABNORMAL HIGH (ref 0.0–2.0)
Bicarbonate: 31 mmol/L — ABNORMAL HIGH (ref 20.0–28.0)
Calcium, Ion: 1.22 mmol/L (ref 1.15–1.40)
HCT: 29 % — ABNORMAL LOW (ref 36.0–46.0)
Hemoglobin: 9.9 g/dL — ABNORMAL LOW (ref 12.0–15.0)
O2 Saturation: 99 %
Potassium: 3.2 mmol/L — ABNORMAL LOW (ref 3.5–5.1)
Sodium: 137 mmol/L (ref 135–145)
TCO2: 33 mmol/L — ABNORMAL HIGH (ref 22–32)
pCO2 arterial: 54.7 mmHg — ABNORMAL HIGH (ref 32.0–48.0)
pH, Arterial: 7.362 (ref 7.350–7.450)
pO2, Arterial: 124 mmHg — ABNORMAL HIGH (ref 83.0–108.0)

## 2019-04-01 LAB — CBC
HCT: 31 % — ABNORMAL LOW (ref 36.0–46.0)
Hemoglobin: 8.9 g/dL — ABNORMAL LOW (ref 12.0–15.0)
MCH: 22.5 pg — ABNORMAL LOW (ref 26.0–34.0)
MCHC: 28.7 g/dL — ABNORMAL LOW (ref 30.0–36.0)
MCV: 78.3 fL — ABNORMAL LOW (ref 80.0–100.0)
Platelets: 359 10*3/uL (ref 150–400)
RBC: 3.96 MIL/uL (ref 3.87–5.11)
RDW: 19.1 % — ABNORMAL HIGH (ref 11.5–15.5)
WBC: 12.9 10*3/uL — ABNORMAL HIGH (ref 4.0–10.5)
nRBC: 0 % (ref 0.0–0.2)

## 2019-04-01 LAB — HEPARIN LEVEL (UNFRACTIONATED): Heparin Unfractionated: <0.1 IU/mL — ABNORMAL LOW (ref 0.30–0.70)

## 2019-04-01 SURGERY — RIGHT/LEFT HEART CATH AND CORONARY ANGIOGRAPHY
Anesthesia: LOCAL

## 2019-04-01 MED ORDER — VERAPAMIL HCL 2.5 MG/ML IV SOLN
INTRAVENOUS | Status: DC | PRN
  Administered 2019-04-01: 10 mL via INTRA_ARTERIAL

## 2019-04-01 MED ORDER — HYDRALAZINE HCL 20 MG/ML IJ SOLN: 10.0000 mg | INTRAMUSCULAR | Status: AC | PRN

## 2019-04-01 MED ORDER — SODIUM CHLORIDE 0.9% FLUSH: 3.0000 mL | INTRAVENOUS | Status: DC | PRN

## 2019-04-01 MED ORDER — LABETALOL HCL 5 MG/ML IV SOLN: 10.0000 mg | INTRAVENOUS | Status: AC | PRN

## 2019-04-01 MED ORDER — LIDOCAINE HCL (PF) 1 % IJ SOLN
INTRAMUSCULAR | Status: DC | PRN
  Administered 2019-04-01 (×2): 2 mL

## 2019-04-01 MED ORDER — HEPARIN (PORCINE) IN NACL 1000-0.9 UT/500ML-% IV SOLN
INTRAVENOUS | Status: AC
  Filled 2019-04-01: qty 1000

## 2019-04-01 MED ORDER — HEPARIN (PORCINE) IN NACL 1000-0.9 UT/500ML-% IV SOLN
INTRAVENOUS | Status: DC | PRN
  Administered 2019-04-01 (×2): 500 mL

## 2019-04-01 MED ORDER — ACETAMINOPHEN 325 MG PO TABS: 650.0000 mg | ORAL_TABLET | ORAL | Status: DC | PRN

## 2019-04-01 MED ORDER — LIDOCAINE HCL (PF) 1 % IJ SOLN
INTRAMUSCULAR | Status: AC
  Filled 2019-04-01: qty 30

## 2019-04-01 MED ORDER — VERAPAMIL HCL 2.5 MG/ML IV SOLN
INTRAVENOUS | Status: AC
  Filled 2019-04-01: qty 2

## 2019-04-01 MED ORDER — SODIUM CHLORIDE 0.9 % IV SOLN: 250.0000 mL | INTRAVENOUS | Status: DC | PRN

## 2019-04-01 MED ORDER — MORPHINE SULFATE (PF) 2 MG/ML IV SOLN: 2.0000 mg | INTRAVENOUS | Status: DC | PRN

## 2019-04-01 MED ORDER — SODIUM CHLORIDE 0.9 % IV SOLN: INTRAVENOUS | Status: AC

## 2019-04-01 MED ORDER — SODIUM CHLORIDE 0.9% FLUSH
3.0000 mL | Freq: Two times a day (BID) | INTRAVENOUS | Status: DC
  Administered 2019-04-02: 3 mL via INTRAVENOUS

## 2019-04-01 MED ORDER — ONDANSETRON HCL 4 MG/2ML IJ SOLN: 4.0000 mg | Freq: Four times a day (QID) | INTRAMUSCULAR | Status: DC | PRN

## 2019-04-01 MED ORDER — HEPARIN SODIUM (PORCINE) 1000 UNIT/ML IJ SOLN
INTRAMUSCULAR | Status: DC | PRN
  Administered 2019-04-01: 6000 [IU] via INTRAVENOUS

## 2019-04-01 SURGICAL SUPPLY — 14 items
BAND CMPR LRG ZPHR (HEMOSTASIS) ×1
BAND ZEPHYR COMPRESS 30 LONG (HEMOSTASIS) ×1 IMPLANT
CATH BALLN WEDGE 5F 110CM (CATHETERS) ×1 IMPLANT
CATH INFINITI JR4 5F (CATHETERS) ×1 IMPLANT
CATH OPTITORQUE TIG 4.0 5F (CATHETERS) ×1 IMPLANT
GLIDESHEATH SLEND A-KIT 6F 22G (SHEATH) ×1 IMPLANT
GUIDEWIRE INQWIRE 1.5J.035X260 (WIRE) IMPLANT
INQWIRE 1.5J .035X260CM (WIRE) ×2
KIT HEART LEFT (KITS) ×2 IMPLANT
PACK CARDIAC CATHETERIZATION (CUSTOM PROCEDURE TRAY) ×2 IMPLANT
SHEATH GLIDE SLENDER 4/5FR (SHEATH) ×1 IMPLANT
TRANSDUCER W/STOPCOCK (MISCELLANEOUS) ×2 IMPLANT
TUBING CIL FLEX 10 FLL-RA (TUBING) ×2 IMPLANT
WIRE HI TORQ VERSACORE-J 145CM (WIRE) ×1 IMPLANT

## 2019-04-01 NOTE — Progress Notes (Signed)
[place] for Heparin Indication: ACS/STEMI  No Known Allergies  Patient Measurements: Height: 5' (152.4 cm) Weight: 275 lb 2.2 oz (124.8 kg) IBW/kg (Calculated) : 45.5 Heparin Dosing Weight: 79 kg  Labs: Recent Labs    03/30/19 1335 03/30/19 1522 03/30/19 2045  03/30/19 2156 03/31/19 0308 03/31/19 0826 03/31/19 1722 04/01/19 0316  HGB 9.4*  --   --   --   --  9.3*  --   --  8.9*  HCT 33.2*  --   --   --   --  32.3*  --   --  31.0*  PLT 360  --   --   --   --  325  --   --  359  HEPARINUNFRC  --   --   --    < > 0.11* 0.17* 0.24* 0.22* <0.10*  CREATININE 1.28*  --  1.17*  --   --  1.15*  --   --  1.33*  TROPONINIHS 108* 114* 120*  --  114*  --   --   --   --    < > = values in this interval not displayed.    Estimated Creatinine Clearance: 52.1 mL/min (A) (by C-G formula based on SCr of 1.33 mg/dL (H)).  Assessment: 63 y.o. female with chest pain. No anticoagulation PTA. Pharmacy has been consulted to dose heparin.  Heparin level undetectable after rate increase overnight. Per RN, pt lost IV access and heparin was off for ~30 min around time of lab draw. Will not adjust heparin at this time, recheck in 6hr for more accurate level.  Goal of Therapy:  Heparin level 0.3-0.7 units/ml Monitor platelets by anticoagulation protocol: Yes   Plan:  Continue heparin 1650 units/hr Repeat heparin level in 6 hours   Arrie Senate, PharmD, BCPS Clinical Pharmacist Please check AMION for all [place] numbers 04/01/2019

## 2019-04-01 NOTE — H&P (View-Only) (Signed)
Progress Note  Patient Name: [name] Date of Encounter: 04/01/2019  Primary Cardiologist: Bettina Gavia  Subjective   Chest is feeling better   Breathing is better    Inpatient Medications    Scheduled Meds: . aspirin EC  81 mg Oral Daily  . irbesartan  75 mg Oral Daily  . metoprolol tartrate  50 mg Oral BID  . norethindrone  5 mg Oral BID  . pravastatin  20 mg Oral QHS  . sodium chloride flush  3 mL Intravenous Q12H   Continuous Infusions: . sodium chloride    . sodium chloride 70 mL/hr at 04/01/19 0835  . heparin 1,650 Units/hr (04/01/19 0136)   PRN Meds: sodium chloride, acetaminophen, nitroGLYCERIN, ondansetron (ZOFRAN) IV, sodium chloride flush   Vital Signs    Vitals:   03/31/19 0555 03/31/19 2051 03/31/19 2220 04/01/19 0530  BP: 122/66 138/85  130/68  Pulse: 80 86 90 82  Resp: (!) 22     Temp: 98.4 F (36.9 C) 98 F (36.7 C)  98 F (36.7 C)  TempSrc: Oral Oral  Oral  SpO2: 93% 94%  90%  Weight: 124.8 kg   125.3 kg  Height:       No intake or output data in the 24 hours ending 04/01/19 1010 Last 3 Weights 04/01/2019 03/31/2019 03/30/2019  Weight (lbs) 276 lb 3.2 oz 275 lb 2.2 oz 287 lb 14.7 oz  Weight (kg) 125.283 kg 124.8 kg 130.6 kg      Telemetry    SR  - Personally Reviewed  ECG    NOne - Personally Reviewed  Physical Exam   GEN:  Morbidly obese in no acute distress.  Falling asleep at times   Neck: No JVD Cardiac: RRRGr II/VI systolic murmur LSB to base  No rubs, or gallops.  Respiratory: Clear to auscultation bilaterally. GI: Soft, nontender, non-distended  Ext  Legs large  No pitting edema; No deformity.  L shin red   Neuro:  Nonfocal  Psych: Normal affect   Labs    High Sensitivity Troponin:   Recent Labs  Lab 03/30/19 1335 03/30/19 1522 03/30/19 2045 03/30/19 2156  TROPONINIHS 108* 114* 120* 114*      Cardiac EnzymesNo results for input(s): TROPONINI in the last 168 hours. No results for input(s): TROPIPOC in the last  168 hours.   Chemistry Recent Labs  Lab 03/30/19 1335 03/30/19 2045 03/31/19 0308 04/01/19 0316  NA 135  --  135 134*  K 3.0*  --  3.2* 3.4*  CL 91*  --  95* 95*  CO2 32  --  29 29  GLUCOSE 177*  --  112* 130*  BUN 14  --  14 19  CREATININE 1.28* 1.17* 1.15* 1.33*  CALCIUM 10.0  --  9.4 9.2  GFRNONAA 44* 49* 50* 42*  GFRAA 51* 57* 58* 49*  ANIONGAP 12  --  11 10     Hematology Recent Labs  Lab 03/30/19 1335 03/31/19 0308 04/01/19 0316  WBC 15.2* 13.1* 12.9*  RBC 4.20 4.16 3.96  HGB 9.4* 9.3* 8.9*  HCT 33.2* 32.3* 31.0*  MCV 79.0* 77.6* 78.3*  MCH 22.4* 22.4* 22.5*  MCHC 28.3* 28.8* 28.7*  RDW 19.3* 19.3* 19.1*  PLT 360 325 359    BNP Recent Labs  Lab 03/30/19 2350  BNP 95.3     DDimer  Recent Labs  Lab 03/30/19 1400  DDIMER 1.36*     Radiology    Dg Chest 2 View  Result  Date: 03/30/2019 CLINICAL DATA:  Chest pressure and shortness of breath for 2 days EXAM: CHEST - 2 VIEW COMPARISON:  03/12/2013 FINDINGS: Cardiac shadow is enlarged but stable. Postsurgical changes are noted. Lungs are well aerated without focal infiltrate or sizable effusion. No acute bony abnormality is seen. IMPRESSION: Postsurgical changes consistent with history of aortic valve replacement. No acute abnormality noted. Electronically Signed   By: Inez Catalina M.D.   On: 03/30/2019 15:12   Ct Angio Chest Pe W And/or Wo Contrast  Result Date: 03/30/2019 CLINICAL DATA:  Abnormal EKG, chest pressure. PE suspected, high pretest probability. EXAM: CT ANGIOGRAPHY CHEST WITH CONTRAST TECHNIQUE: Multidetector CT imaging of the chest was performed using the standard protocol during bolus administration of intravenous contrast. Multiplanar CT image reconstructions and MIPs were obtained to evaluate the vascular anatomy. CONTRAST:  131mL OMNIPAQUE IOHEXOL 350 MG/ML SOLN COMPARISON:  None. FINDINGS: Cardiovascular: There is no pulmonary embolism identified within the main, lobar or central segmental  pulmonary arteries bilaterally. No thoracic aortic aneurysm or evidence of aortic dissection. Aortic valve hardware in place. Cardiomegaly. No pericardial effusion. Mediastinum/Nodes: No mass or enlarged lymph nodes seen within the mediastinum or perihilar regions. Focal hyperdense material within the lower esophagus, of uncertain etiology, most suggestive of oral contrast material. Esophagus otherwise unremarkable. Trachea and central bronchi are unremarkable. Lungs/Pleura: Lungs are clear. No pleural effusion or pneumothorax. Upper Abdomen: Limited images of the upper abdomen are unremarkable. Musculoskeletal: No acute or suspicious osseous finding. Mild degenerative spondylosis throughout the slightly scoliotic thoracic spine. Median sternotomy wires in place. Review of the MIP images confirms the above findings. IMPRESSION: 1. No pulmonary embolism seen. 2. No pneumonia or pulmonary edema. 3. Cardiomegaly. No pericardial effusion. 4. Focal hyperdense material within the lower esophagus, most suggestive of oral contrast material. Has patient had recent oral contrast for an outside study? Otherwise, of uncertain etiology. Esophagus appears otherwise unremarkable. Electronically Signed   By: Franki Cabot M.D.   On: 03/30/2019 17:30    Cardiac Studies   Echo ordered  Patient Profile     63 y.o. female with hx of AS (s/p AVR), HTN, morbid obesity, OSA, lymphedema and LE ulcers who presents for chest pain/chest pressure.   Assessment & Plan    1  Chest pressure   Pt complicated    Hx AS   No CAD by report in 2014  S/p AVR LV is severely hypertrophied   There appears to be a gradient at rest through LV/AV  Mean 23 mm Hg    ? If symptoms related to ventricular anatomy/obstruction  With diastolic dysfunction   Less likely CAD   She was on diuretics prior with progressive SOB  Would recomm R and L heart cath to define anatomy and pressiures  2   AV dz   Pt s/p AVR (bioprosthesis ) in 2014   Mean  gradient through the valve of 26 mm HG   Will get hemodynamics    3   CKD  Cr 1.33 today   Minimize contrast  4   ANemia   Pt with heavy menses   Was on hormone therapy which I have held    Plans for D/C  5   Lymphedema  Recovering from cellulitis in L leg   Pt to be seen at [facility] by lymphedema specialist.    For questions or updates, please contact [facility] Please consult [email or web address] for contact info under        Signed, Dorris Carnes, MD  04/01/2019, 10:10 AM

## 2019-04-01 NOTE — Interval H&P Note (Signed)
Cath Lab Visit (complete for each Cath Lab visit)  Clinical Evaluation Leading to the Procedure:   ACS: Yes.    Non-ACS:    Anginal Classification: CCS III  Anti-ischemic medical therapy: No Therapy  Non-Invasive Test Results: No non-invasive testing performed  Prior CABG: No previous CABG      History and Physical Interval Note:  04/01/2019 4:39 PM  [name]  has presented today for surgery, with the diagnosis of chest pain.  The various methods of treatment have been discussed with the patient and family. After consideration of risks, benefits and other options for treatment, the patient has consented to  Procedure(s): RIGHT/LEFT HEART CATH AND CORONARY ANGIOGRAPHY (N/A) as a surgical intervention.  The patient's history has been reviewed, patient examined, no change in status, stable for surgery.  I have reviewed the patient's chart and labs.  Questions were answered to the patient's satisfaction.     Quay Burow

## 2019-04-01 NOTE — Progress Notes (Addendum)
Progress Note  Patient Name: [name] Date of Encounter: 04/01/2019  Primary Cardiologist: Bettina Gavia  Subjective   Chest is feeling better   Breathing is better    Inpatient Medications    Scheduled Meds: . aspirin EC  81 mg Oral Daily  . irbesartan  75 mg Oral Daily  . metoprolol tartrate  50 mg Oral BID  . norethindrone  5 mg Oral BID  . pravastatin  20 mg Oral QHS  . sodium chloride flush  3 mL Intravenous Q12H   Continuous Infusions: . sodium chloride    . sodium chloride 70 mL/hr at 04/01/19 0835  . heparin 1,650 Units/hr (04/01/19 0136)   PRN Meds: sodium chloride, acetaminophen, nitroGLYCERIN, ondansetron (ZOFRAN) IV, sodium chloride flush   Vital Signs    Vitals:   03/31/19 0555 03/31/19 2051 03/31/19 2220 04/01/19 0530  BP: 122/66 138/85  130/68  Pulse: 80 86 90 82  Resp: (!) 22     Temp: 98.4 F (36.9 C) 98 F (36.7 C)  98 F (36.7 C)  TempSrc: Oral Oral  Oral  SpO2: 93% 94%  90%  Weight: 124.8 kg   125.3 kg  Height:       No intake or output data in the 24 hours ending 04/01/19 1010 Last 3 Weights 04/01/2019 03/31/2019 03/30/2019  Weight (lbs) 276 lb 3.2 oz 275 lb 2.2 oz 287 lb 14.7 oz  Weight (kg) 125.283 kg 124.8 kg 130.6 kg      Telemetry    SR  - Personally Reviewed  ECG    NOne - Personally Reviewed  Physical Exam   GEN:  Morbidly obese in no acute distress.  Falling asleep at times   Neck: No JVD Cardiac: RRRGr II/VI systolic murmur LSB to base  No rubs, or gallops.  Respiratory: Clear to auscultation bilaterally. GI: Soft, nontender, non-distended  Ext  Legs large  No pitting edema; No deformity.  L shin red   Neuro:  Nonfocal  Psych: Normal affect   Labs    High Sensitivity Troponin:   Recent Labs  Lab 03/30/19 1335 03/30/19 1522 03/30/19 2045 03/30/19 2156  TROPONINIHS 108* 114* 120* 114*      Cardiac EnzymesNo results for input(s): TROPONINI in the last 168 hours. No results for input(s): TROPIPOC in the last  168 hours.   Chemistry Recent Labs  Lab 03/30/19 1335 03/30/19 2045 03/31/19 0308 04/01/19 0316  NA 135  --  135 134*  K 3.0*  --  3.2* 3.4*  CL 91*  --  95* 95*  CO2 32  --  29 29  GLUCOSE 177*  --  112* 130*  BUN 14  --  14 19  CREATININE 1.28* 1.17* 1.15* 1.33*  CALCIUM 10.0  --  9.4 9.2  GFRNONAA 44* 49* 50* 42*  GFRAA 51* 57* 58* 49*  ANIONGAP 12  --  11 10     Hematology Recent Labs  Lab 03/30/19 1335 03/31/19 0308 04/01/19 0316  WBC 15.2* 13.1* 12.9*  RBC 4.20 4.16 3.96  HGB 9.4* 9.3* 8.9*  HCT 33.2* 32.3* 31.0*  MCV 79.0* 77.6* 78.3*  MCH 22.4* 22.4* 22.5*  MCHC 28.3* 28.8* 28.7*  RDW 19.3* 19.3* 19.1*  PLT 360 325 359    BNP Recent Labs  Lab 03/30/19 2350  BNP 95.3     DDimer  Recent Labs  Lab 03/30/19 1400  DDIMER 1.36*     Radiology    Dg Chest 2 View  Result  Date: 03/30/2019 CLINICAL DATA:  Chest pressure and shortness of breath for 2 days EXAM: CHEST - 2 VIEW COMPARISON:  03/12/2013 FINDINGS: Cardiac shadow is enlarged but stable. Postsurgical changes are noted. Lungs are well aerated without focal infiltrate or sizable effusion. No acute bony abnormality is seen. IMPRESSION: Postsurgical changes consistent with history of aortic valve replacement. No acute abnormality noted. Electronically Signed   By: Inez Catalina M.D.   On: 03/30/2019 15:12   Ct Angio Chest Pe W And/or Wo Contrast  Result Date: 03/30/2019 CLINICAL DATA:  Abnormal EKG, chest pressure. PE suspected, high pretest probability. EXAM: CT ANGIOGRAPHY CHEST WITH CONTRAST TECHNIQUE: Multidetector CT imaging of the chest was performed using the standard protocol during bolus administration of intravenous contrast. Multiplanar CT image reconstructions and MIPs were obtained to evaluate the vascular anatomy. CONTRAST:  195mL OMNIPAQUE IOHEXOL 350 MG/ML SOLN COMPARISON:  None. FINDINGS: Cardiovascular: There is no pulmonary embolism identified within the main, lobar or central segmental  pulmonary arteries bilaterally. No thoracic aortic aneurysm or evidence of aortic dissection. Aortic valve hardware in place. Cardiomegaly. No pericardial effusion. Mediastinum/Nodes: No mass or enlarged lymph nodes seen within the mediastinum or perihilar regions. Focal hyperdense material within the lower esophagus, of uncertain etiology, most suggestive of oral contrast material. Esophagus otherwise unremarkable. Trachea and central bronchi are unremarkable. Lungs/Pleura: Lungs are clear. No pleural effusion or pneumothorax. Upper Abdomen: Limited images of the upper abdomen are unremarkable. Musculoskeletal: No acute or suspicious osseous finding. Mild degenerative spondylosis throughout the slightly scoliotic thoracic spine. Median sternotomy wires in place. Review of the MIP images confirms the above findings. IMPRESSION: 1. No pulmonary embolism seen. 2. No pneumonia or pulmonary edema. 3. Cardiomegaly. No pericardial effusion. 4. Focal hyperdense material within the lower esophagus, most suggestive of oral contrast material. Has patient had recent oral contrast for an outside study? Otherwise, of uncertain etiology. Esophagus appears otherwise unremarkable. Electronically Signed   By: Franki Cabot M.D.   On: 03/30/2019 17:30    Cardiac Studies   Echo ordered  Patient Profile     63 y.o. female with hx of AS (s/p AVR), HTN, morbid obesity, OSA, lymphedema and LE ulcers who presents for chest pain/chest pressure.   Assessment & Plan    1  Chest pressure   Pt complicated    Hx AS   No CAD by report in 2014  S/p AVR LV is severely hypertrophied   There appears to be a gradient at rest through LV/AV  Mean 23 mm Hg    ? If symptoms related to ventricular anatomy/obstruction  With diastolic dysfunction   Less likely CAD   She was on diuretics prior with progressive SOB  Would recomm R and L heart cath to define anatomy and pressiures  2   AV dz   Pt s/p AVR (bioprosthesis ) in 2014   Mean  gradient through the valve of 26 mm HG   Will get hemodynamics    3   CKD  Cr 1.33 today   Minimize contrast  4   ANemia   Pt with heavy menses   Was on hormone therapy which I have held    Plans for D/C  5   Lymphedema  Recovering from cellulitis in L leg   Pt to be seen at [facility] by lymphedema specialist.    For questions or updates, please contact [facility] Please consult [email or web address] for contact info under        Signed, Dorris Carnes, MD  04/01/2019, 10:10 AM

## 2019-04-02 ENCOUNTER — Encounter (HOSPITAL_COMMUNITY): Payer: Self-pay | Admitting: Cardiovascular Disease

## 2019-04-02 DIAGNOSIS — Z7982 Long term (current) use of aspirin: Secondary | ICD-10-CM | POA: Diagnosis not present

## 2019-04-02 DIAGNOSIS — I130 Hypertensive heart and chronic kidney disease with heart failure and stage 1 through stage 4 chronic kidney disease, or unspecified chronic kidney disease: Secondary | ICD-10-CM | POA: Diagnosis present

## 2019-04-02 DIAGNOSIS — Z952 Presence of prosthetic heart valve: Secondary | ICD-10-CM | POA: Diagnosis not present

## 2019-04-02 DIAGNOSIS — D649 Anemia, unspecified: Secondary | ICD-10-CM | POA: Diagnosis present

## 2019-04-02 DIAGNOSIS — D259 Leiomyoma of uterus, unspecified: Secondary | ICD-10-CM | POA: Diagnosis present

## 2019-04-02 DIAGNOSIS — G4733 Obstructive sleep apnea (adult) (pediatric): Secondary | ICD-10-CM | POA: Diagnosis present

## 2019-04-02 DIAGNOSIS — I248 Other forms of acute ischemic heart disease: Secondary | ICD-10-CM | POA: Diagnosis present

## 2019-04-02 DIAGNOSIS — I249 Acute ischemic heart disease, unspecified: Secondary | ICD-10-CM | POA: Diagnosis present

## 2019-04-02 DIAGNOSIS — Z79890 Hormone replacement therapy (postmenopausal): Secondary | ICD-10-CM | POA: Diagnosis not present

## 2019-04-02 DIAGNOSIS — R079 Chest pain, unspecified: Secondary | ICD-10-CM | POA: Diagnosis not present

## 2019-04-02 DIAGNOSIS — N939 Abnormal uterine and vaginal bleeding, unspecified: Secondary | ICD-10-CM | POA: Diagnosis present

## 2019-04-02 DIAGNOSIS — N920 Excessive and frequent menstruation with regular cycle: Secondary | ICD-10-CM | POA: Diagnosis present

## 2019-04-02 DIAGNOSIS — N938 Other specified abnormal uterine and vaginal bleeding: Secondary | ICD-10-CM | POA: Diagnosis present

## 2019-04-02 DIAGNOSIS — Z8249 Family history of ischemic heart disease and other diseases of the circulatory system: Secondary | ICD-10-CM | POA: Diagnosis not present

## 2019-04-02 DIAGNOSIS — I890 Lymphedema, not elsewhere classified: Secondary | ICD-10-CM | POA: Diagnosis present

## 2019-04-02 DIAGNOSIS — N189 Chronic kidney disease, unspecified: Secondary | ICD-10-CM | POA: Diagnosis present

## 2019-04-02 DIAGNOSIS — Z6841 Body Mass Index (BMI) 40.0 and over, adult: Secondary | ICD-10-CM | POA: Diagnosis not present

## 2019-04-02 DIAGNOSIS — Z79899 Other long term (current) drug therapy: Secondary | ICD-10-CM | POA: Diagnosis not present

## 2019-04-02 DIAGNOSIS — Z1159 Encounter for screening for other viral diseases: Secondary | ICD-10-CM | POA: Diagnosis not present

## 2019-04-02 DIAGNOSIS — I5032 Chronic diastolic (congestive) heart failure: Secondary | ICD-10-CM | POA: Diagnosis present

## 2019-04-02 DIAGNOSIS — I350 Nonrheumatic aortic (valve) stenosis: Secondary | ICD-10-CM | POA: Diagnosis present

## 2019-04-02 DIAGNOSIS — R0600 Dyspnea, unspecified: Secondary | ICD-10-CM | POA: Diagnosis not present

## 2019-04-02 LAB — BASIC METABOLIC PANEL
Anion gap: 9 (ref 5–15)
BUN: 16 mg/dL (ref 8–23)
CO2: 27 mmol/L (ref 22–32)
Calcium: 8.8 mg/dL — ABNORMAL LOW (ref 8.9–10.3)
Chloride: 98 mmol/L (ref 98–111)
Creatinine, Ser: 1.06 mg/dL — ABNORMAL HIGH (ref 0.44–1.00)
GFR calc Af Amer: >60 mL/min (ref >60)
GFR calc non Af Amer: 55 mL/min — ABNORMAL LOW (ref >60)
Glucose, Bld: 115 mg/dL — ABNORMAL HIGH (ref 70–99)
Potassium: 3.8 mmol/L (ref 3.5–5.1)
Sodium: 134 mmol/L — ABNORMAL LOW (ref 135–145)

## 2019-04-02 LAB — CBC
HCT: 30.6 % — ABNORMAL LOW (ref 36.0–46.0)
Hemoglobin: 8.6 g/dL — ABNORMAL LOW (ref 12.0–15.0)
MCH: 22.5 pg — ABNORMAL LOW (ref 26.0–34.0)
MCHC: 28.1 g/dL — ABNORMAL LOW (ref 30.0–36.0)
MCV: 79.9 fL — ABNORMAL LOW (ref 80.0–100.0)
Platelets: 357 K/uL (ref 150–400)
RBC: 3.83 MIL/uL — ABNORMAL LOW (ref 3.87–5.11)
RDW: 19.3 % — ABNORMAL HIGH (ref 11.5–15.5)
WBC: 13.2 K/uL — ABNORMAL HIGH (ref 4.0–10.5)
nRBC: 0 % (ref 0.0–0.2)

## 2019-04-02 MED ORDER — TORSEMIDE 20 MG PO TABS: ORAL_TABLET | ORAL | 2 refills | Status: DC

## 2019-04-02 MED ORDER — POTASSIUM CHLORIDE CRYS ER 20 MEQ PO TBCR
20.0000 meq | EXTENDED_RELEASE_TABLET | Freq: Every day | ORAL | Status: DC
  Administered 2019-04-02: 20 meq via ORAL
  Filled 2019-04-02: qty 1

## 2019-04-02 MED ORDER — POTASSIUM CHLORIDE CRYS ER 10 MEQ PO TBCR: 10.0000 meq | EXTENDED_RELEASE_TABLET | Freq: Every day | ORAL | 3 refills | Status: DC

## 2019-04-02 MED ORDER — FUROSEMIDE 10 MG/ML IJ SOLN
80.0000 mg | Freq: Once | INTRAMUSCULAR | Status: AC
  Administered 2019-04-02: 80 mg via INTRAVENOUS
  Filled 2019-04-02: qty 8

## 2019-04-02 NOTE — Progress Notes (Signed)
Patient discharged via wheelchair to private vehicle with husband. She was in stable condition. I reviewed her discharge instructions and she had no further questions. She verbalized Heart healthy dietary modifications and need to weigh herself daily. She verbalized ability to take care of cath site.

## 2019-04-02 NOTE — Progress Notes (Signed)
Progress Note  Patient Name: [name] Date of Encounter: 04/02/2019  Primary Cardiologist: Bettina Gavia  Subjective   Chest is feeling better   Breathing is better    Inpatient Medications    Scheduled Meds: . aspirin EC  81 mg Oral Daily  . furosemide  80 mg Intravenous Once  . irbesartan  75 mg Oral Daily  . metoprolol tartrate  50 mg Oral BID  . norethindrone  5 mg Oral BID  . potassium chloride  20 mEq Oral Daily  . pravastatin  20 mg Oral QHS  . sodium chloride flush  3 mL Intravenous Q12H   Continuous Infusions: . sodium chloride     PRN Meds: sodium chloride, acetaminophen, morphine injection, nitroGLYCERIN, ondansetron (ZOFRAN) IV, sodium chloride flush   Vital Signs    Vitals:   04/01/19 2119 04/01/19 2234 04/02/19 0246 04/02/19 0521  BP: (!) 110/54 (!) 126/116  122/64  Pulse: 90 89  84  Resp: 18   19  Temp: 98.1 F (36.7 C)   98.1 F (36.7 C)  TempSrc: Oral   Oral  SpO2: 95%   93%  Weight:   125 kg   Height:        Intake/Output Summary (Last 24 hours) at 04/02/2019 0734 Last data filed at 04/01/2019 2000 Gross per 24 hour  Intake 400 ml  Output -  Net 400 ml   Last 3 Weights 04/02/2019 04/01/2019 03/31/2019  Weight (lbs) 275 lb 9.2 oz 276 lb 3.2 oz 275 lb 2.2 oz  Weight (kg) 125 kg 125.283 kg 124.8 kg      Telemetry    SR  - Personally Reviewed  ECG    NOne - Personally Reviewed  Physical Exam   GEN:  Morbidly obese in no acute distress.  Falling asleep at times   Neck: No JVD Cardiac: RRRGr II/VI systolic murmur LSB to base  No rubs, or gallops.  Respiratory: Clear to auscultation bilaterally. GI: Soft, nontender, non-distended  Ext  Legs large  No pitting edema; No deformity.  L shin red   Neuro:  Nonfocal  Psych: Normal affect   Labs    High Sensitivity Troponin:   Recent Labs  Lab 03/30/19 1335 03/30/19 1522 03/30/19 2045 03/30/19 2156  TROPONINIHS 108* 114* 120* 114*      Cardiac EnzymesNo results for input(s):  TROPONINI in the last 168 hours. No results for input(s): TROPIPOC in the last 168 hours.   Chemistry Recent Labs  Lab 03/31/19 0308 04/01/19 0316 04/01/19 1723 04/01/19 1735 04/02/19 0343  NA 135 134* 137  137 137 134*  K 3.2* 3.4* 3.3*  3.3* 3.2* 3.8  CL 95* 95*  --   --  98  CO2 29 29  --   --  27  GLUCOSE 112* 130*  --   --  115*  BUN 14 19  --   --  16  CREATININE 1.15* 1.33*  --   --  1.06*  CALCIUM 9.4 9.2  --   --  8.8*  GFRNONAA 50* 42*  --   --  55*  GFRAA 58* 49*  --   --  >60  ANIONGAP 11 10  --   --  9     Hematology Recent Labs  Lab 03/31/19 0308 04/01/19 0316 04/01/19 1723 04/01/19 1735 04/02/19 0343  WBC 13.1* 12.9*  --   --  13.2*  RBC 4.16 3.96  --   --  3.83*  HGB 9.3* 8.9*  10.2*  10.5* 9.9* 8.6*  HCT 32.3* 31.0* 30.0*  31.0* 29.0* 30.6*  MCV 77.6* 78.3*  --   --  79.9*  MCH 22.4* 22.5*  --   --  22.5*  MCHC 28.8* 28.7*  --   --  28.1*  RDW 19.3* 19.1*  --   --  19.3*  PLT 325 359  --   --  357    BNP Recent Labs  Lab 03/30/19 2350  BNP 95.3     DDimer  Recent Labs  Lab 03/30/19 1400  DDIMER 1.36*     Radiology    No results found.  Cardiac Studies   Cardiac catheterization 04/01/19   L heart cath    Normal coronary arteries  R Heart cath  Most Recent Value  Fick Cardiac Output 7.58 L/min  Fick Cardiac Output Index 3.54 (L/min)/BSA  RA A Wave 12 mmHg  RA V Wave 12 mmHg  RA Mean 9 mmHg  RV Systolic Pressure 41 mmHg  RV Diastolic Pressure 6 mmHg  RV EDP 12 mmHg  PA Systolic Pressure 38 mmHg  PA Diastolic Pressure 9 mmHg  PA Mean 27 mmHg  PW A Wave 22 mmHg  PW V Wave 25 mmHg  PW Mean 21 mmHg  AO Systolic Pressure 92 mmHg  AO Diastolic Pressure 56 mmHg  AO Mean 72 mmHg  LV Systolic Pressure 732 mmHg  LV Diastolic Pressure 6 mmHg  LV EDP 18 mmHg  AOp Systolic Pressure 2 mmHg  AOp Diastolic Pressure 0 mmHg  AOp Mean Pressure 2 mmHg  LVp Systolic Pressure 202 mmHg  LVp Diastolic Pressure 5 mmHg  LVp EDP  Pressure 19 mmHg  QP/QS 1  TPVR Index 7.63 HRUI  TSVR Index 20.31 HRUI  PVR SVR Ratio 0.1  TPVR/TSVR Ratio      Patient Profile     63 y.o. female with hx of AS (s/p AVR), HTN, morbid obesity, OSA, lymphedema and LE ulcers who presents for chest pain/chest pressure.   Assessment & Plan    1  Chest pressure   Pt complicated    Hx AS   No CAD by report in 2014  S/p AVR LV is severely hypertrophied   There appears to be a gradient at rest through LV/AV  Mean 23 mm Hg     Cath as noted above   WIll give lasix IV today 80 mg x1   Patient's hemodynamics are fragile  With severe LVH and mod AS  Discussed with B Munley   REcomm  Demedex 20 mg daily   WIll give 10 KCL with that    No metalozone  F/U in clinic in a couple wks Daily wt   Call in quickly increasing    Low NA  2   AV dz   Pt s/p AVR (bioprosthesis ) in 2014   Mean gradient through the valve of 26 mm HG   Will get hemodynamics    3   CKD  Cr 1.o today    4   ANemia   Pt with heavy menses  Hormone Rx resumed  5   Lymphedema  Recovering from cellulitis in L leg   Pt to be seen at [facility] by lymphedema specialist.    6  Anemia   Pt with uterine bleeding   WIll need D/C   On  Norethindrone     D/C today   For questions or updates, please contact [facility] Please consult [email or web address] for contact info under  Signed, Dorris Carnes, MD  04/02/2019, 7:34 AM

## 2019-04-02 NOTE — Discharge Instructions (Signed)
Please call to schedule an appointment with your OB/GYN for ongoing monitoring of your uterine bleeding.   Heart Failure Education: 1. Weigh yourself EVERY morning after you go to the bathroom but before you eat or drink anything. Write this number down in a weight log/diary. If you gain 3 pounds overnight or 5 pounds in a week, call the office. 2. Take your medicines as prescribed. If you have concerns about your medications, please call us before you stop taking them.  3. Eat low salt foods--Limit salt (sodium) to 2000 mg per day. This will help prevent your body from holding onto fluid. Read food labels as many processed foods have a lot of sodium, especially canned goods and prepackaged meats. If you would like some assistance choosing low sodium foods, we would be happy to set you up with a nutritionist. 4. Stay as active as you can everyday. Staying active will give you more energy and make your muscles stronger. Start with 5 minutes at a time and work your way up to 30 minutes a day. Break up your activities--do some in the morning and some in the afternoon. Start with 3 days per week and work your way up to 5 days as you can.  If you have chest pain, feel short of breath, dizzy, or lightheaded, STOP. If you don't feel better after a short rest, call 911. If you do feel better, call the office to let us know you have symptoms with exercise. 5. Limit all fluids for the day to less than 2 liters. Fluid includes all drinks, coffee, juice, ice chips, soup, jello, and all other liquids.  PLEASE REMEMBER TO BRING ALL OF YOUR MEDICATIONS TO EACH OF YOUR FOLLOW-UP OFFICE VISITS.  PLEASE ATTEND ALL SCHEDULED FOLLOW-UP APPOINTMENTS.   Activity: Increase activity slowly as tolerated. You may shower, but no soaking baths (or swimming) for 1 week. No driving for 24 hours. No lifting over 5 lbs for 1 week. No sexual activity for 1 week.   You May Return to Work: in 1 week (if applicable)  Wound Care: You  may wash cath site gently with soap and water. Keep cath site clean and dry. If you notice pain, swelling, bleeding or pus at your cath site, please call [phone].

## 2019-04-02 NOTE — Discharge Summary (Signed)
Discharge Summary    Patient ID: [name],  MRN: [id number], DOB/AGE: Jan 23, 1956 63 y.o.  Admit date: 03/30/2019 Discharge date: 04/02/2019  Primary Care Provider: Jacqlyn Larsen II Primary Cardiologist: Shirlee More, MD  Discharge Diagnoses    Principal Problem:   Aortic stenosis Active Problems:   Hypertensive heart disease with heart failure (HCC)   Morbid obesity (HCC)   Dysfunctional uterine bleeding   Chest pain   ACS (acute coronary syndrome) ([place])   Allergies No Known Allergies  Diagnostic Studies/Procedures    Echocardiogram 03/31/2019: IMPRESSIONS    1. The left ventricle has hyperdynamic systolic function, with an ejection fraction of >65%. The cavity size was normal. There is severely increased left ventricular wall thickness. Left ventricular diastolic Doppler parameters are consistent with  pseudonormalization. No evidence of left ventricular regional wall motion abnormalities.  2. The right ventricle has normal systolic function. The cavity was normal. There is no increase in right ventricular wall thickness.  3. Left atrial size was mildly dilated.  4. Moderate thickening of the aortic valve. Moderate calcification of the aortic valve. Aortic valve regurgitation was not assessed by color flow Doppler. Moderate stenosis of the aortic valve.  5. Peak aortic valve velocity: 3.25m/s, mean gradient 44mmHg.  6. The aorta is abnormal in size and structure.  7. There is dilatation of the ascending aorta measuring 42 mm.  Right/Left heart catheterization 04/01/2019: IMPRESSION: [name] has normal coronary arteries and mildly elevated filling pressures.  I do not think her chest pain is ischemically mediated.  Her enzymes are low and flat.  It could be related to demand ischemia.  Continue medical therapy will be recommended.  The sheaths were removed and a TR band was placed on the right wrist to achieve patent hemostasis.  The patient left lab in stable  condition.  The findings were reviewed with Dr. Dorris Carnes. 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   In addition she  has a history of severe OSA, HTN, and HL. She is followed by B Munley in cardiology clinic, last saw him in clinic on March 23, 2019. Her BP was elevated and she was recommended to start Telmisartan   She also had mild decompensation of CHF and was started on weekly Metalozone.   [name] presented with chest pain that started 03/29/2019.  She said it felt like indigestion though she does not get much reflux. This eased some with TUMS but still had substernal chest pressure   Also complained of R shoulder pain.   Pt went to PCP and brought to ED for further evaluation.  The patient does say she has had increased cramping in legs since metalozone added. At the time of admission she denied CP.  Breathing is OK   No dizziness  No hx of syncope.   Hospital Course     Consultants: None   1. Chest pain: patient presented with chest pressure. Trops mildly elevated with flat trend (108>114>120>114). EKG without ischemic changes. Echo with EF >65%, severely increased LV wall thickness, G2DD, no RWMA, moderate AS, and 31mm dilation of the ascending aorta. She underwent a [facility] 04/01/2019 which showed normal coronary arteries with mildly elevated filling pressures. Cath site was stable post catheterization.  - Continue risk factor  modifications below  2. Chronic diastolic CHF: patient reported progressive SOB for the past several months. Recently started on metolazone outpatient, though this caused LE cramping. Echo with EF >65% and G2DD. RHC suggested mildly elevated filling pressures. She received IV lasix 80mg  prior to discharge with plans to continue torsemide 20mg  daily at home.  - Continue torsemide 20mg  daily + potassium 10 mEq  daily - Continue metoprolol and telmisartan - Metolazone held at discharge.  - Patient instructed to monitor her weight daily and limit her sodium intake.   3. Aortic valve disease: s/p bioprosthetic AVR in 2014. Echo this admission with moderate AS with mean gradiant through the valve of 26 mmHg - Continue routine monitoring  4. CKD stage 3: Cr stable post catheterization, at 1.06 on the day of discharge - Continue to monitor routinely outpatient  5. HLD: LDL 57 this admission - Continue pravastatin  6. Uterine bleeding: chronic issues 2/2 uterine fibroids.  - Continue home norethindrone - Recommend close OB/GYN follow-up after discharge.   7. Lymphedema: worse with recent cellulitis infection in the LLE.  - Continue to follow at [facility] for lymphedema management  8. Anemia: 2/2 uterine bleeding which occurred this admission. Hgb 8.6 on the day of discharge.  - Continue to monitor routinely outpatient.  _____________  Discharge Vitals Blood pressure 122/64, pulse 84, temperature 98.1 F (36.7 C), temperature source Oral, resp. rate 19, height 5' (1.524 m), weight 125 kg, SpO2 93 %.  Filed Weights   03/31/19 0555 04/01/19 0530 04/02/19 0246  Weight: 124.8 kg 125.3 kg 125 kg    Labs & Radiologic Studies    CBC Recent Labs    04/01/19 0316  04/01/19 1735 04/02/19 0343  WBC 12.9*  --   --  13.2*  HGB 8.9*   < > 9.9* 8.6*  HCT 31.0*   < > 29.0* 30.6*  MCV 78.3*  --   --  79.9*  PLT 359  --   --  357   < > = values in this interval not displayed.   Basic Metabolic Panel Recent Labs    03/30/19 1522  04/01/19 0316  04/01/19 1735 04/02/19 0343  NA  --    < > 134*   < > 137 134*  K  --    < > 3.4*   < > 3.2* 3.8  CL  --    < > 95*  --   --  98  CO2  --    < > 29  --   --  27  GLUCOSE  --    < > 130*  --   --  115*  BUN  --    < > 19  --   --  16  CREATININE  --    < > 1.33*  --   --  1.06*  CALCIUM  --    < > 9.2  --   --  8.8*  MG 1.9  --   --   --   --   --    <  > = values in this interval not displayed.   Liver Function Tests No results for input(s): AST, ALT, ALKPHOS, BILITOT, PROT, ALBUMIN in the last 72 hours. No results for input(s): LIPASE, AMYLASE in the last 72 hours. Cardiac Enzymes No results for input(s): CKTOTAL, CKMB, CKMBINDEX, TROPONINI in the last 72 hours. BNP Invalid input(s): POCBNP D-Dimer Recent Labs    03/30/19 1400  DDIMER 1.36*   Hemoglobin  A1C No results for input(s): HGBA1C in the last 72 hours. Fasting Lipid Panel Recent Labs    03/31/19 0308  CHOL 112  HDL 22*  LDLCALC 57  TRIG 166*  CHOLHDL 5.1   Thyroid Function Tests No results for input(s): TSH, T4TOTAL, T3FREE, THYROIDAB in the last 72 hours.  Invalid input(s): FREET3 _____________  Dg Chest 2 View  Result Date: 03/30/2019 CLINICAL DATA:  Chest pressure and shortness of breath for 2 days EXAM: CHEST - 2 VIEW COMPARISON:  03/12/2013 FINDINGS: Cardiac shadow is enlarged but stable. Postsurgical changes are noted. Lungs are well aerated without focal infiltrate or sizable effusion. No acute bony abnormality is seen. IMPRESSION: Postsurgical changes consistent with history of aortic valve replacement. No acute abnormality noted. Electronically Signed   By: Inez Catalina M.D.   On: 03/30/2019 15:12   Ct Angio Chest Pe W And/or Wo Contrast  Result Date: 03/30/2019 CLINICAL DATA:  Abnormal EKG, chest pressure. PE suspected, high pretest probability. EXAM: CT ANGIOGRAPHY CHEST WITH CONTRAST TECHNIQUE: Multidetector CT imaging of the chest was performed using the standard protocol during bolus administration of intravenous contrast. Multiplanar CT image reconstructions and MIPs were obtained to evaluate the vascular anatomy. CONTRAST:  170mL OMNIPAQUE IOHEXOL 350 MG/ML SOLN COMPARISON:  None. FINDINGS: Cardiovascular: There is no pulmonary embolism identified within the main, lobar or central segmental pulmonary arteries bilaterally. No thoracic aortic aneurysm or  evidence of aortic dissection. Aortic valve hardware in place. Cardiomegaly. No pericardial effusion. Mediastinum/Nodes: No mass or enlarged lymph nodes seen within the mediastinum or perihilar regions. Focal hyperdense material within the lower esophagus, of uncertain etiology, most suggestive of oral contrast material. Esophagus otherwise unremarkable. Trachea and central bronchi are unremarkable. Lungs/Pleura: Lungs are clear. No pleural effusion or pneumothorax. Upper Abdomen: Limited images of the upper abdomen are unremarkable. Musculoskeletal: No acute or suspicious osseous finding. Mild degenerative spondylosis throughout the slightly scoliotic thoracic spine. Median sternotomy wires in place. Review of the MIP images confirms the above findings. IMPRESSION: 1. No pulmonary embolism seen. 2. No pneumonia or pulmonary edema. 3. Cardiomegaly. No pericardial effusion. 4. Focal hyperdense material within the lower esophagus, most suggestive of oral contrast material. Has patient had recent oral contrast for an outside study? Otherwise, of uncertain etiology. Esophagus appears otherwise unremarkable. Electronically Signed   By: Franki Cabot M.D.   On: 03/30/2019 17:30   Disposition   Patient was seen and examined by Dr. Harrington Challenger who deemed patient as stable for discharge. Follow-up has been arranged. Discharge medications as listed below.   Follow-up Plans & Appointments    Follow-up Information    Richardo Priest, MD Follow up on 04/13/2019.   Specialty: Cardiology Why: 1:55 pm hosptial follow up  Contact information: [address] [place] [address] [phone]          Discharge Instructions    Admit to Inpatient (patient's expected length of stay will be greater than 2 midnights or inpatient only procedure)   Complete by: As directed    Hospital Area: [place]   Level of Care: Telemetry Cardiac   Covid Evaluation: Confirmed COVID Negative   Diagnosis: Chest pain     Level of Care: Telemetry   Admitting Physician: ROSS, PAULA V   Estimated length of stay: past midnight tomorrow   Certification: I certify this patient will need inpatient services for at least 2 midnights   Attending Physician: Dorris Carnes V      Discharge Medications   Allergies  as of 04/02/2019   No Known Allergies     Medication List    STOP taking these medications   metolazone 2.5 MG tablet Commonly known as: ZAROXOLYN     TAKE these medications   aspirin EC 81 MG tablet Take 81 mg by mouth daily.   metoprolol tartrate 50 MG tablet Commonly known as: LOPRESSOR Take 1 tablet by mouth twice daily   norethindrone 5 MG tablet Commonly known as: AYGESTIN Take 5 mg by mouth 2 (two) times daily.   potassium chloride 10 MEQ tablet Commonly known as: K-DUR Take 1 tablet (10 mEq total) by mouth daily. Start taking on: April 03, 2019   pravastatin 20 MG tablet Commonly known as: PRAVACHOL Take 20 mg by mouth at bedtime.   telmisartan 20 MG tablet Commonly known as: MICARDIS Take 1 tablet (20 mg total) by mouth daily.   torsemide 20 MG tablet Commonly known as: DEMADEX Take 1 tablet (20 mg)  by mouth once daily. What changed: additional instructions          Outstanding Labs/Studies   None  Duration of Discharge Encounter   Greater than 30 minutes including physician time.  Signed, Abigail Butts PA-C 04/02/2019, 11:31 AM

## 2019-04-08 ENCOUNTER — Encounter (HOSPITAL_COMMUNITY): Payer: Self-pay | Admitting: Cardiovascular Disease

## 2019-04-12 NOTE — Progress Notes (Signed)
Cardiology Office Note:    Date:  04/13/2019   ID:  [name], DOB 1956/08/01, MRN [id number]  PCP:  [name], MD  Cardiologist:  Shirlee More, MD    Referring MD: [name], MD    ASSESSMENT:    1. H/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   2. Hypertensive heart disease with heart failure (HCC)   3. Other iron deficiency anemia    PLAN:    In order of problems listed above:  1. Clinically with heart failure high gradients across the valve and abnormal appearance on transesophageal echocardiogram and suspect prosthetic valve dysfunction previous TEE indicated some aortic stenosis we will plan on doing a TAVR cardiac CTA protocol. 2. Stable continue her current diuretic antihypertensives 3. Continue iron   Next appointment: 4 weeks   Medication Adjustments/Labs and Tests Ordered: Current medicines are reviewed at length with the patient today.  Concerns regarding medicines are outlined above.  No orders of the defined types were placed in this encounter.  No orders of the defined types were placed in this encounter.   No chief complaint on file.   History of Present Illness:    [name] is a 63 y.o. female with a hx of severe symptomatic aortic stenosis with heart failure and underwent 23 mm Hancock SAVR on 03/30/13 Dr Clementeen Graham at [facility] , hypertension and heart failure. Recent TEE showed dynamic LVOT obstruction and normal AVR function. She was  diagnosed with  severe obstructive and central sleep apnea with nocturnal hypoxemia nadir 59% and initiated on CPAP which was changed to BiPAP.  Her other problems include nonhealing leg ulcers and dysfunctional uterine bleeding pending D&C.  She was admitted to [facility] with chest pain 03/30/2019.  Coronary angiography was normal.  She underwent left and right heart catheterization showing elevated left atrial pressure and increased gradient across her valve.  She previously underwent  transesophageal echocardiogram and was found to have dynamic LV outflow tract obstruction. Compliance with diet, lifestyle and medications: Yes  She is seen in follow-up to recent admission to the hospital with chest pain.  She underwent coronary arteriography that was normal.  She had a 43 mm peak to peak gradient across the valve and echocardiogram again suggested AVR dysfunction.  I looked at the conventional echo and the valve leaflets look thickened there is no subaortic gradient I think she is at risk for prosthetic valve dysfunction.  I discussed the case with Dr. Burt Knack structural heart disease and should undergo a TAVR cardiac CTA protocol to evaluate her.  If the valve function is normal she will proceed with her planned GYN surgery.  Weight is stable still has edema shortness of breath with activity but no orthopnea no further chest pain or syncope Past Medical History:  Diagnosis Date  . Aortic stenosis 03/23/2013   Overview:  02/18/13 TTE EF >55%. Critical AS with mean Ao valve gradient of 82 mm Hg. No AI. No MR, PR, mild TR. Estimated RVSP 30 mm Hg.  . Bicuspid aortic valve   . Edema of both legs 02/20/2017  . 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 Heart failure ([place])   . HTN (hypertension) 03/23/2013  . 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 Morbid obesity ([place]) 02/20/2017  . Sleep apnea     Past Surgical History:  Procedure Laterality Date  . CHOLECYSTECTOMY  1983  . RIGHT/LEFT HEART CATH AND CORONARY ANGIOGRAPHY N/A 04/01/2019   Procedure:  RIGHT/LEFT HEART CATH AND CORONARY ANGIOGRAPHY;  Surgeon: Lorretta Harp, MD;  Location: [place] CV LAB;  Service: Cardiovascular;  Laterality: N/A;  . TEE WITHOUT CARDIOVERSION N/A 07/01/2018   Procedure: TRANSESOPHAGEAL ECHOCARDIOGRAM (TEE);  Surgeon: Buford Dresser, MD;  Location: [facility] ENDOSCOPY;  Service: Cardiovascular;  Laterality: N/A;  . TISSUE AORTIC VALVE REPLACEMENT      Current Medications: Current Meds   Medication Sig  . aspirin EC 81 MG tablet Take 81 mg by mouth daily.  . metoprolol tartrate (LOPRESSOR) 50 MG tablet Take 1 tablet by mouth twice daily  . norethindrone (AYGESTIN) 5 MG tablet Take 5 mg by mouth 2 (two) times daily.   . potassium chloride SA (K-DUR) 10 MEQ tablet Take 1 tablet (10 mEq total) by mouth daily.  . pravastatin (PRAVACHOL) 20 MG tablet Take 20 mg by mouth at bedtime.   Marland Kitchen telmisartan (MICARDIS) 20 MG tablet Take 1 tablet (20 mg total) by mouth daily.  Marland Kitchen torsemide (DEMADEX) 20 MG tablet Take 1 tablet (20 mg)  by mouth once daily.     Allergies:   Patient has no known allergies.   Social History   Socioeconomic History  . Marital status: Married    Spouse name: Not on file  . Number of children: Not on file  . Years of education: Not on file  . Highest education level: Not on file  Occupational History  . Not on file  Social Needs  . Financial resource strain: Not on file  . Food insecurity    Worry: Not on file    Inability: Not on file  . Transportation needs    Medical: Not on file    Non-medical: Not on file  Tobacco Use  . Smoking status: Never Smoker  . Smokeless tobacco: Never Used  Substance and Sexual Activity  . Alcohol use: No    Frequency: Never  . Drug use: No  . Sexual activity: Not Currently  Lifestyle  . Physical activity    Days per week: Not on file    Minutes per session: Not on file  . Stress: Not on file  Relationships  . Social Herbalist on phone: Not on file    Gets together: Not on file    Attends religious service: Not on file    Active member of club or organization: Not on file    Attends meetings of clubs or organizations: Not on file    Relationship status: Not on file  Other Topics Concern  . Not on file  Social History Narrative  . Not on file     Family History: The patient's family history includes Heart attack in her mother; Parkinson's disease in her father. ROS:   Please see the history  of present illness.    All other systems reviewed and are negative.  EKGs/Labs/Other Studies Reviewed:    The following studies were reviewed today  Cardiac cath 04/01/2019: Right Heart  Right Heart Pressures Hemodynamic findings consistent with aortic valve stenosis. Right atrial pressure- 12/12 Right ventricular pressure-41/6 Pulmonary artery pressure-38/9, mean 27 Pulmonary wedge pressure- A-wave 22, V wave 25, mean 21 LVEDP-18 Cardiac output-7.58 L/min with an index of 3.54 L/min/m  Coronary Diagrams  Diagnostic Dominance: Right  Intervention   Echo 03/31/2019:  1. The left ventricle has hyperdynamic systolic function, with an ejection fraction of >65%. The cavity size was normal. There is severely increased left ventricular wall thickness. Left ventricular diastolic Doppler parameters are consistent with  pseudonormalization. No evidence of left ventricular regional wall motion abnormalities.  2. The right ventricle has normal systolic function. The cavity was normal. There is no increase in right ventricular wall thickness.  3. Left atrial size was mildly dilated.  4. Moderate thickening of the aortic valve. Moderate calcification of the aortic valve. Aortic valve regurgitation was not assessed by color flow Doppler. Moderate stenosis of the aortic valve.  5. Peak aortic valve velocity: 3.46m/s, mean gradient 47mmHg.  6. The aorta is abnormal in size and structure.  7. There is dilatation of the ascending aorta measuring 42 mm.  TEE 07/02/2019: Impressions: - Images show normal function of bioprosthetic aortic valve.   Leaflets not fully visualized but appear to open well. Color   doppler shows open flow into valve orifice.   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 Due to   sedation, provocation maneuvers such as valsalva were unable to   be performed.     TEE suggests normal aortic  valve function with LVOT gradient.  Recent Labs:  Hemoglobin 12.0 - 15.0 g/dL 8.6Low   9.9Low   10.2Low   10.5Low    02/12/2019: ALT 19; NT-Pro BNP 640 03/30/2019: B Natriuretic Peptide 95.3; Magnesium 1.9 04/02/2019: BUN 16; Creatinine, Ser 1.06; Hemoglobin 8.6; Platelets 357; Potassium 3.8; Sodium 134  Recent Lipid Panel    Component Value Date/Time   CHOL 112 03/31/2019 0308   CHOL 125 02/12/2019 1116   TRIG 166 (H) 03/31/2019 0308   HDL 22 (L) 03/31/2019 0308   HDL 37 (L) 02/12/2019 1116   CHOLHDL 5.1 03/31/2019 0308   VLDL 33 03/31/2019 0308   LDLCALC 57 03/31/2019 0308   LDLCALC 64 02/12/2019 1116    Physical Exam:    VS:  BP 126/74   Pulse 95   Temp 99 F (37.2 C)   Ht 5\' 2"  (1.575 m)   Wt 287 lb (130.2 kg)   SpO2 92%   BMI 52.49 kg/m     Wt Readings from Last 3 Encounters:  04/13/19 287 lb (130.2 kg)  04/02/19 275 lb 9.2 oz (125 kg)  03/23/19 288 lb (130.6 kg)     GEN:  Well nourished, well developed in no acute distress HEENT: Normal NECK: No JVD; No carotid bruits LYMPHATICS: No lymphadenopathy CARDIAC: 2-3?6 SEM aortic areaRRR, no murmurs, rubs, gallops RESPIRATORY:  Clear to auscultation without rales, wheezing or rhonchi  ABDOMEN: Soft, non-tender, non-distended MUSCULOSKELETAL:  No edema; No deformity  SKIN: Warm and dry NEUROLOGIC:  Alert and oriented x 3 PSYCHIATRIC:  Normal affect    Signed, Shirlee More, MD  04/13/2019 2:19 PM    [facility]

## 2019-04-13 ENCOUNTER — Other Ambulatory Visit: Payer: Self-pay

## 2019-04-13 ENCOUNTER — Encounter: Payer: Self-pay | Admitting: Cardiology

## 2019-04-13 ENCOUNTER — Ambulatory Visit (INDEPENDENT_AMBULATORY_CARE_PROVIDER_SITE_OTHER): Payer: BC Managed Care – PPO | Admitting: Cardiology

## 2019-04-13 VITALS — BP 126/74 | HR 95 | Temp 99.0°F | Ht 62.0 in | Wt 287.0 lb

## 2019-04-13 DIAGNOSIS — I110 Hypertensive heart disease with heart failure: Secondary | ICD-10-CM

## 2019-04-13 DIAGNOSIS — Z01818 Encounter for other preprocedural examination: Secondary | ICD-10-CM

## 2019-04-13 DIAGNOSIS — D508 Other iron deficiency anemias: Secondary | ICD-10-CM | POA: Diagnosis not present

## 2019-04-13 DIAGNOSIS — Z954 Presence of other heart-valve replacement: Secondary | ICD-10-CM

## 2019-04-13 NOTE — Patient Instructions (Signed)
Medication Instructions:  Your physician recommends that you continue on your current medications as directed. Please refer to the Current Medication list given to you today.  If you need a refill on your cardiac medications before your next appointment, please call your pharmacy.   Lab work: 3-7 DAys prior to CT: BMP  If you have labs (blood work) drawn today and your tests are completely normal, you will receive your results only by: Marland Kitchen MyChart Message (if you have MyChart) OR . A paper copy in the mail If you have any lab test that is abnormal or we need to change your treatment, we will call you to review the results.  Testing/Procedures: Your physician has requested that you have cardiac CT. Cardiac computed tomography (CT) is a painless test that uses an x-ray machine to take clear, detailed pictures of your heart. For further information please visit [email or web address]. Please follow instruction sheet as given.  Your cardiac CT will be scheduled at one of the below locations:   [facility] [address] [place], [place] [address] [phone]  Please arrive at the [facility] main entrance of [facility] 30-45 minutes prior to test start time. Proceed to the [facility] Radiology Department (first floor) to check-in and test prep.  Please follow these instructions carefully (unless otherwise directed):  Hold all erectile dysfunction medications at least 48 hours prior to test.  On the Night Before the Test: . Be sure to Drink plenty of water. . Do not consume any caffeinated/decaffeinated beverages or chocolate 12 hours prior to your test. . Do not take any antihistamines 12 hours prior to your test.  On the Day of the Test: . Drink plenty of water. Do not drink any water within one hour of the test. . Do not eat any food 4 hours prior to the test. . You may take your regular medications prior to the test.  . Take metoprolol (Lopressor) two hours  prior to test. . FEMALES- please wear underwire-free bra if available                -If HR is less than 55 BPM- No Beta Blocker.      After the Test: . Drink plenty of water. . After receiving IV contrast, you may experience a mild flushed feeling. This is normal. . On occasion, you may experience a mild rash up to 24 hours after the test. This is not dangerous. If this occurs, you can take Benadryl 25 mg and increase your fluid intake. . If you experience trouble breathing, this can be serious. If it is severe call 911 IMMEDIATELY. If it is mild, please call our office. . If you take any of these medications: Glipizide/Metformin, Avandament, Glucavance, please do not take 48 hours after completing test.    Please contact the cardiac imaging nurse navigator should you have any questions/concerns Marchia Bond, RN Navigator Cardiac Imaging [facility] [phone] Office  [phone] Cell    Follow-Up: At [facility], you and your health needs are our priority.  As part of our continuing mission to provide you with exceptional heart care, we have created designated Provider Care Teams.  These Care Teams include your primary Cardiologist (physician) and Advanced Practice Providers (APPs -  Physician Assistants and Nurse Practitioners) who all work together to provide you with the care you need, when you need it. You will need a follow up appointment in 4 weeks.  Please call our  office 2 months in advance to schedule this appointment.  You may see Shirlee More, MDor another member of our [place] Provider Team in [place]: Jenne Campus, MD . Jyl Heinz, MD  Any Other Special Instructions Will Be Listed Below (If Applicable).

## 2019-04-20 ENCOUNTER — Other Ambulatory Visit: Payer: Self-pay

## 2019-04-20 DIAGNOSIS — L97929 Non-pressure chronic ulcer of unspecified part of left lower leg with unspecified severity: Secondary | ICD-10-CM

## 2019-04-28 ENCOUNTER — Telehealth (HOSPITAL_COMMUNITY): Payer: Self-pay | Admitting: Rehabilitation

## 2019-04-28 ENCOUNTER — Telehealth: Payer: Self-pay | Admitting: Cardiology

## 2019-04-28 NOTE — Telephone Encounter (Signed)

## 2019-04-28 NOTE — Telephone Encounter (Signed)
Pt states her insurance denied her CT and she wants to know what the next step is

## 2019-04-29 ENCOUNTER — Ambulatory Visit (INDEPENDENT_AMBULATORY_CARE_PROVIDER_SITE_OTHER): Payer: BC Managed Care – PPO | Admitting: Vascular Surgery

## 2019-04-29 ENCOUNTER — Ambulatory Visit (HOSPITAL_COMMUNITY)
Admission: RE | Admit: 2019-04-29 | Discharge: 2019-04-29 | Disposition: A | Discharge status: Home / Self Care | Payer: BC Managed Care – PPO | Source: Ambulatory Visit | Attending: Vascular Surgery | Admitting: Vascular Surgery

## 2019-04-29 ENCOUNTER — Encounter: Payer: Self-pay | Admitting: Vascular Surgery

## 2019-04-29 ENCOUNTER — Other Ambulatory Visit: Payer: Self-pay

## 2019-04-29 VITALS — BP 127/73 | HR 82 | Temp 98.0°F | Resp 16 | Ht 62.0 in | Wt 287.0 lb

## 2019-04-29 DIAGNOSIS — R609 Edema, unspecified: Secondary | ICD-10-CM | POA: Diagnosis not present

## 2019-04-29 DIAGNOSIS — L97929 Non-pressure chronic ulcer of unspecified part of left lower leg with unspecified severity: Secondary | ICD-10-CM | POA: Insufficient documentation

## 2019-04-29 NOTE — Progress Notes (Signed)
Referring Physician: Dr. Bettina Gavia  Patient name: [name] MRN: [id number] DOB: 1956/04/29 Sex: female  REASON FOR CONSULT: Chronic leg swelling with ulcers  HPI: [name] is a 63 y.o. female, with a long history of bilateral leg swelling left worse than right.  She has had multiple exacerbation and remissions of ulcerations in her left leg.  She has had multiple episodes of cellulitis in the past treated with oral antibiotics.  She has tried to wear compression stockings in the past however sometimes putting the stockings on it causes her a new wound on her leg so she has not been as compliant with these.  She has been told in the past by the wound center that she had lymphedema.  She does have a lymphedema pump but again she does not use this very often because she states it makes the ulcers worse.  She apparently has an office visit scheduled at [facility] lymphedema clinic at the end of September.  However, she was told by them that she could not be seen if she had active ongoing wounds.  She currently has a few scattered ulcerations on the left leg.  She had bilateral ABIs and a venous duplex performed October 2019.  ABIs were normal venous duplex was normal with no evidence of DVT and no reflux.  Patient is fairly limited in her physical activity due to shortness of breath.  She is able to walk about a quarter block before becoming short of breath.  She has seen someone for nutritional counseling in the past but has not really had any successful weight loss or does not really have a weight loss program in place.  She gets minimal exercise.  She apparently is being evaluated to see whether or not she needs redo aortic valve replacement.  Other medical problems include hypertension, obesity, sleep apnea.  These are all currently stable.  Past Medical History:  Diagnosis Date  . Aortic stenosis 03/23/2013   Overview:  02/18/13 TTE EF >55%. Critical AS with mean Ao valve gradient of 82  mm Hg. No AI. No MR, PR, mild TR. Estimated RVSP 30 mm Hg.  . Bicuspid aortic valve   . Edema of both legs 02/20/2017  . 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 Heart failure ([place])   . HTN (hypertension) 03/23/2013  . 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 Morbid obesity ([place]) 02/20/2017  . Sleep apnea    Past Surgical History:  Procedure Laterality Date  . CHOLECYSTECTOMY  1983  . RIGHT/LEFT HEART CATH AND CORONARY ANGIOGRAPHY N/A 04/01/2019   Procedure: RIGHT/LEFT HEART CATH AND CORONARY ANGIOGRAPHY;  Surgeon: Lorretta Harp, MD;  Location: [facility];  Service: Cardiovascular;  Laterality: N/A;  . TEE WITHOUT CARDIOVERSION N/A 07/01/2018   Procedure: TRANSESOPHAGEAL ECHOCARDIOGRAM (TEE);  Surgeon: Buford Dresser, MD;  Location: [facility] ENDOSCOPY;  Service: Cardiovascular;  Laterality: N/A;  . TISSUE AORTIC VALVE REPLACEMENT      Family History  Problem Relation Age of Onset  . Parkinson's disease Father   . Heart attack Mother     SOCIAL HISTORY: Social History   Socioeconomic History  . Marital status: Married    Spouse name: Not on file  . Number of children: Not on file  . Years of education: Not on file  . Highest education level: Not on file  Occupational History  . Not on file  Social Needs  . Financial resource strain: Not on file  .  Food insecurity    Worry: Not on file    Inability: Not on file  . Transportation needs    Medical: Not on file    Non-medical: Not on file  Tobacco Use  . Smoking status: Never Smoker  . Smokeless tobacco: Never Used  Substance and Sexual Activity  . Alcohol use: No    Frequency: Never  . Drug use: No  . Sexual activity: Not Currently  Lifestyle  . Physical activity    Days per week: Not on file    Minutes per session: Not on file  . Stress: Not on file  Relationships  . Social Herbalist on phone: Not on file    Gets together: Not on file    Attends religious service: Not on file     Active member of club or organization: Not on file    Attends meetings of clubs or organizations: Not on file    Relationship status: Not on file  . Intimate partner violence    Fear of current or ex partner: Not on file    Emotionally abused: Not on file    Physically abused: Not on file    Forced sexual activity: Not on file  Other Topics Concern  . Not on file  Social History Narrative  . Not on file    No Known Allergies  Current Outpatient Medications  Medication Sig Dispense Refill  . aspirin EC 81 MG tablet Take 81 mg by mouth daily.    . metoprolol tartrate (LOPRESSOR) 50 MG tablet Take 1 tablet by mouth twice daily 60 tablet 0  . norethindrone (AYGESTIN) 5 MG tablet Take 5 mg by mouth 2 (two) times daily.     . potassium chloride SA (K-DUR) 10 MEQ tablet Take 1 tablet (10 mEq total) by mouth daily. 30 tablet 3  . pravastatin (PRAVACHOL) 20 MG tablet Take 20 mg by mouth at bedtime.     Marland Kitchen telmisartan (MICARDIS) 20 MG tablet Take 1 tablet (20 mg total) by mouth daily. 30 tablet 3  . torsemide (DEMADEX) 20 MG tablet Take 1 tablet (20 mg)  by mouth once daily. 180 tablet 2   No current facility-administered medications for this visit.     ROS:   General:  No weight loss, Fever, chills  HEENT: No recent headaches, no nasal bleeding, no visual changes, no sore throat  Neurologic: No dizziness, blackouts, seizures. No recent symptoms of stroke or mini- stroke. No recent episodes of slurred speech, or temporary blindness.  Cardiac: No recent episodes of chest pain/pressure, no shortness of breath at rest.  + shortness of breath with exertion.  Denies history of atrial fibrillation or irregular heartbeat  Vascular: No history of rest pain in feet.  No history of claudication.  + history of non-healing ulcer, No history of DVT   Pulmonary: No home oxygen, no productive cough, no hemoptysis,  No asthma or wheezing  Musculoskeletal:  [ ]  Arthritis, [ ]  Low back pain,  [ ]  Joint  pain  Hematologic:No history of hypercoagulable state.  No history of easy bleeding.  No history of anemia  Gastrointestinal: No hematochezia or melena,  No gastroesophageal reflux, no trouble swallowing  Urinary: [ ]  chronic Kidney disease, [ ]  on HD - [ ]  MWF or [ ]  TTHS, [ ]  Burning with urination, [ ]  Frequent urination, [ ]  Difficulty urinating;   Skin: No rashes  Psychological: No history of anxiety,  No history of depression  Physical Examination  Vitals:   04/29/19 0839  BP: 127/73  Pulse: 82  Resp: 16  Temp: 98 F (36.7 C)  TempSrc: Temporal  SpO2: 95%  Weight: 287 lb (130.2 kg)  Height: 5\' 2"  (1.575 m)    Body mass index is 52.49 kg/m.  General:  Alert and oriented, no acute distress HEENT: Normal Neck: No JVD Cardiac: Regular Rate and Rhythm Abdomen: Soft, non-tender, non-distended, no mass, obese Skin: No rash, 2 ulcers pretibial area left leg less than 1 mm depth 4 mm diameter Extremity Pulses:  2+ radial, brachial, femoral, dorsalis pedis, posterior tibial pulses bilaterally Musculoskeletal: No deformity use nonpitting left leg and right leg edema, no buffalo hump either foot, no real squaring of toes Neurologic: Upper and lower extremity motor 5/5 and symmetric  DATA:  She had bilateral ABIs performed today which were greater than 1 triphasic and normal bilaterally  ASSESSMENT: Chronic leg swelling with ulcers.  This is most likely multifactorial.  Although the patient may have a component of lymphatic obstruction she does not really have all the classic features of lymphedema.  I suspect a large component of this problem for her is venous hypertension secondary to her obesity.  She also probably has lpedema edema secondary to her obesity.  We had a lengthy discussion today about nutrition and how she needs to have some dietary changes for weight loss and that weight loss is significantly improved leg ulcer symptoms in this type of case 75% of the time.   We also discussed compliance with compression therapy.  If she is not able to use her lymphedema pumps or her compression stockings she should at least have an Ace wrap on the leg to have some compression to prevent recurrent ulceration she will continue to follow-up with the wound center regarding this.  I also discussed with her today an exercise program however she is fairly limited in the amount of exercise she can do due to shortness of breath.  I told her to try to work up to a 30-minute walk once daily.  No evidence of venous reflux.  No evidence of arterial occlusive disease.   PLAN: Patient will try to wear compression stockings on the left leg or at least an Ace wrap as much as possible to decrease symptoms.  She will try to use her lymphedema pump when possible.  She has follow-up scheduled with the lymphedema clinic at [facility].  Weight loss was discussed.  Exercise program was discussed.  Patient will follow-up with [place] on an as-needed basis.   Ruta Hinds, MD [facility] Office: [phone] Pager: [phone]

## 2019-04-29 NOTE — Telephone Encounter (Signed)
Reached out to Mack Guise, our cardiac CTA scheduler, to verify this information. Will update patient accordingly.

## 2019-04-29 NOTE — Telephone Encounter (Signed)
Informed patient that Ivin Booty reports that our precert department is working on an appeal to get testing approved. Advised patient our office would be in touch with her as we received updates. Patient verbalized understanding and has been rescheduled for a follow up appointment on 05/14/2019 at 2:55 pm in the [facility] office. Patient agreeable to call and reschedule if her cardiac CTA does not get schedule prior to her follow up appointment. No further questions.

## 2019-05-06 ENCOUNTER — Ambulatory Visit: Payer: BC Managed Care – PPO | Admitting: Cardiology

## 2019-05-13 ENCOUNTER — Telehealth: Payer: Self-pay | Admitting: Cardiology

## 2019-05-13 NOTE — Telephone Encounter (Signed)
Has not had CT, her insurance keeps denying it

## 2019-05-14 ENCOUNTER — Ambulatory Visit: Payer: BC Managed Care – PPO | Admitting: Cardiology

## 2019-05-14 NOTE — Telephone Encounter (Signed)
Spoke with Ivin Booty who reports that insurance approval for the cardiac CTA has been received. Patient has been scheduled for 05/20/2019 at 12:30 pm at [facility].

## 2019-05-14 NOTE — Telephone Encounter (Signed)
Duplicate encounter. Please see previous encounter.  

## 2019-05-19 ENCOUNTER — Telehealth (HOSPITAL_COMMUNITY): Payer: Self-pay | Admitting: Emergency Medicine

## 2019-05-19 NOTE — Telephone Encounter (Signed)
Spoke with patient momentarily on the phone but call dropped. Attempted to call patient back but got VM, left detailed VM on messaging system  Marchia Bond RN Navigator Cardiac Imaging [facility] [phone] Office  [phone] Cell

## 2019-05-20 ENCOUNTER — Other Ambulatory Visit: Payer: Self-pay

## 2019-05-20 ENCOUNTER — Other Ambulatory Visit (HOSPITAL_COMMUNITY): Payer: Self-pay | Admitting: Emergency Medicine

## 2019-05-20 ENCOUNTER — Ambulatory Visit (HOSPITAL_COMMUNITY)
Admission: RE | Admit: 2019-05-20 | Discharge: 2019-05-20 | Disposition: A | Discharge status: Home / Self Care | Payer: BC Managed Care – PPO | Source: Ambulatory Visit | Attending: Cardiology | Admitting: Cardiology

## 2019-05-20 ENCOUNTER — Encounter (INDEPENDENT_AMBULATORY_CARE_PROVIDER_SITE_OTHER): Payer: BC Managed Care – PPO

## 2019-05-20 DIAGNOSIS — I350 Nonrheumatic aortic (valve) stenosis: Secondary | ICD-10-CM | POA: Diagnosis present

## 2019-05-20 DIAGNOSIS — I471 Supraventricular tachycardia: Secondary | ICD-10-CM

## 2019-05-20 DIAGNOSIS — Z954 Presence of other heart-valve replacement: Secondary | ICD-10-CM | POA: Diagnosis present

## 2019-05-20 DIAGNOSIS — G459 Transient cerebral ischemic attack, unspecified: Secondary | ICD-10-CM

## 2019-05-20 DIAGNOSIS — I110 Hypertensive heart disease with heart failure: Secondary | ICD-10-CM | POA: Insufficient documentation

## 2019-05-20 LAB — POCT I-STAT CREATININE: Creatinine, Ser: 0.8 mg/dL (ref 0.44–1.00)

## 2019-05-20 IMAGING — CT CT HEART MORP W/ CTA COR W/ SCORE W/ CA W/CM &/OR W/O CM
1 of 16 series · 3 of 20 positions shown, 4 images · IV contrast (APPLIED)
Comparison: Chest CTA [DATE].
COMPARISON: Chest CTA [DATE].

Addendum:
EXAM:
OVER-READ INTERPRETATION  CT CHEST

The following report is an over-read performed by radiologist Dr.
JARVIS [REDACTED] on [DATE]. This
over-read does not include interpretation of cardiac or coronary
anatomy or pathology. The coronary calcium score/coronary CTA
interpretation by the cardiologist is attached.
CLINICAL DATA: 63-year-old male with h/o AVR with a 23 mm JARVIS
valve on [DATE], now with increased gradients. Coronary angiography
in [DATE] showed no significant CAD and 43 mm peak to peak
gradient across the valve and echocardiogram suggestive of AVR
dysfunction.
Cardiac TAVR CT
TECHNIQUE: The patient was scanned on a Phillips Force scanner. A 120 kV
retrospective scan was triggered in the descending thoracic aorta at
111 HU's. Gantry rotation speed was 250 msecs and collimation was .6
mm. No beta blockade or nitro were given. The 3D data set was
reconstructed in 5% intervals of the R-R cycle. Systolic and
diastolic phases were analyzed on a dedicated work station using
MPR, MIP and VRT modes. The patient received 80 cc of contrast.

[Series 26: 0-90% · axial · 0.40mm/px · z∈[-263,-59]mm · 3 of 3410 slices shown, 4 images]
[im 1/3410  vessel]
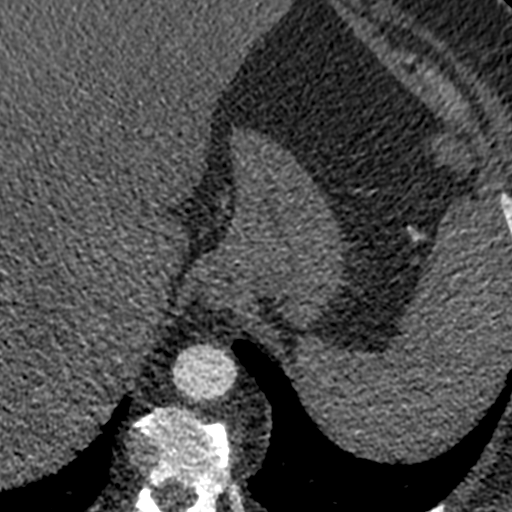
[im 1/3410  lung]
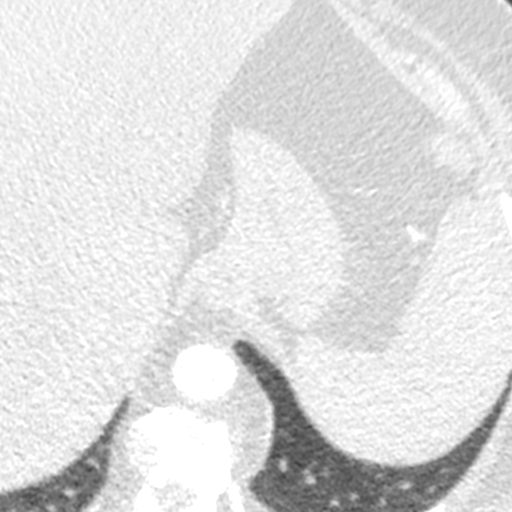
[im 1705/3410  vessel]
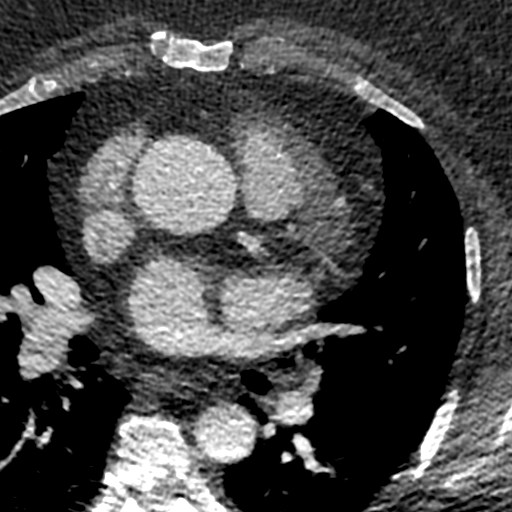
[im 3410/3410  vessel]
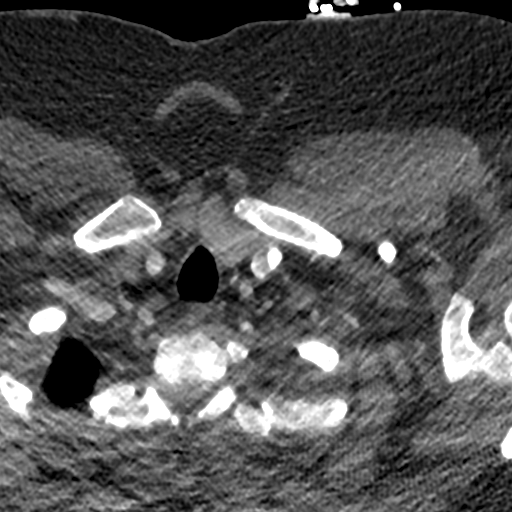

[3 of 20 positions shown; findings below may reference images not displayed]

FINDINGS: Extracardiac findings will be described separately under dictation
for contemporaneously obtained CTA chest, abdomen and pelvis.
IMPRESSION: Please see separate dictation for contemporaneously obtained CTA
chest, abdomen and pelvis dated [DATE] for full description of
relevant extracardiac findings.
FINDINGS: Aortic Valve: 23 mm JARVIS valve is seen in the aortic position.
Annular ring measures 23 x 23 mm. There is no abnormal valve motion.
No dehiscence/perivalvular leak. Leaflet visualization is
significantly limited secondary to patient's size (BMI 52). There
are two hypo-attenuated areas under the leaflets that possibly
represent leaflets thrombosis.

Aorta: There is ascending aortic aneurysm with maximum diameter 42
mm. Normal size of the aortic arch and descending aorta. Minimal
atherosclerotic plaque, no dissection.

Sinotubular Junction: 33 x 33 mm

Ascending Thoracic Aorta: 42 x 41 mm

Aortic Arch: 25 x 23 mm

Descending Thoracic Aorta: 23 x 23 mm

No thrombus in the left atrial appendage.

Normal size of the pulmonary artery.
IMPRESSION: 1. 23 mm JARVIS valve is seen in the aortic position with normal
size of the annular ring (23 mm). There is no abnormal valve motion,
no dehiscence/perivalvular leak. Unfortunately, secondary to
patient's size (BMI 52) and JARVIS artifact leaflet
visualization is very limited and leaflet opening is not visualized.
There are two hypo-attenuated areas under the leaflets that possibly
represent leaflets thrombosis.

In the presence of symptoms consider JARVIS for further evaluation.

2. Ascending aortic aneurysm with maximum diameter 42 mm. Annual
chest CTA or MRA is recommended.

*** End of Addendum ***
EXAM:
OVER-READ INTERPRETATION  CT CHEST

The following report is an over-read performed by radiologist Dr.
JARVIS [REDACTED] on [DATE]. This
over-read does not include interpretation of cardiac or coronary
anatomy or pathology. The coronary calcium score/coronary CTA
interpretation by the cardiologist is attached.
FINDINGS: Extracardiac findings will be described separately under dictation
for contemporaneously obtained CTA chest, abdomen and pelvis.
IMPRESSION: Please see separate dictation for contemporaneously obtained CTA
chest, abdomen and pelvis dated [DATE] for full description of
relevant extracardiac findings.

## 2019-05-20 IMAGING — CT CT ANGIO CHEST
1 of 5 series · 9 of 36 positions shown · IV contrast (APPLIED)
Comparison: CTA of the chest [DATE].

CLINICAL DATA: 63-year-old female with history of severe aortic
valve disease. Preprocedural study prior to potential transcatheter
aortic valve replacement (TAVR) procedure.

EXAM:
CT ANGIOGRAPHY CHEST, ABDOMEN AND PELVIS
TECHNIQUE: Multidetector CT imaging through the chest, abdomen and pelvis was
performed using the standard protocol during bolus administration of
intravenous contrast. Multiplanar reconstructed images and MIPs were
obtained and reviewed to evaluate the vascular anatomy.
CONTRAST:  100mL OMNIPAQUE IOHEXOL 350 MG/ML SOLN

[Series 16: lungs · axial · 0.84mm/px · z∈[-582,-84]mm · 9 of 208 slices shown]
[im 21/208  lung]
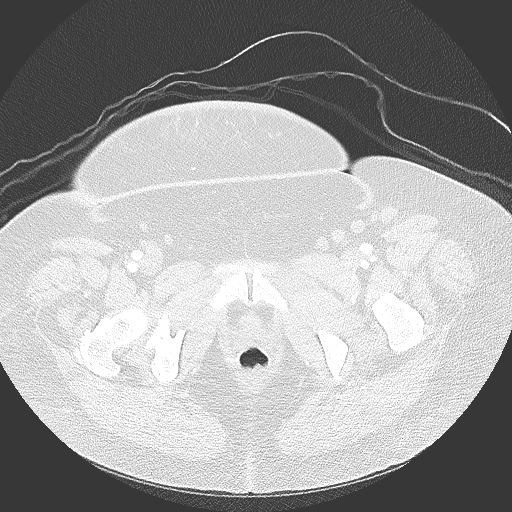
[im 42/208  mediastinal]
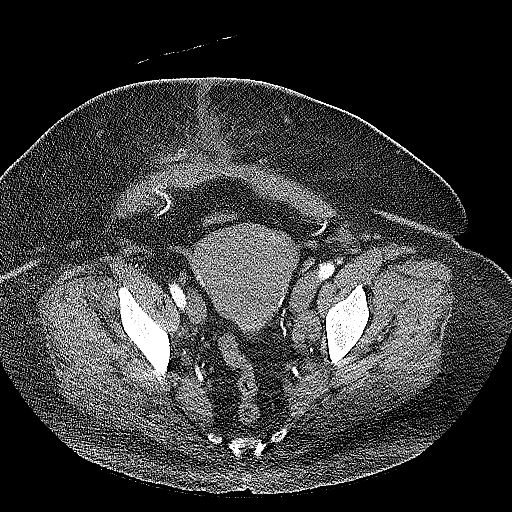
[im 63/208  lung]
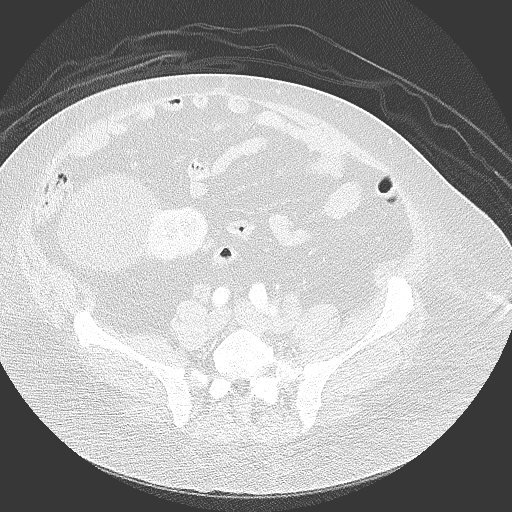
[im 83/208  mediastinal]
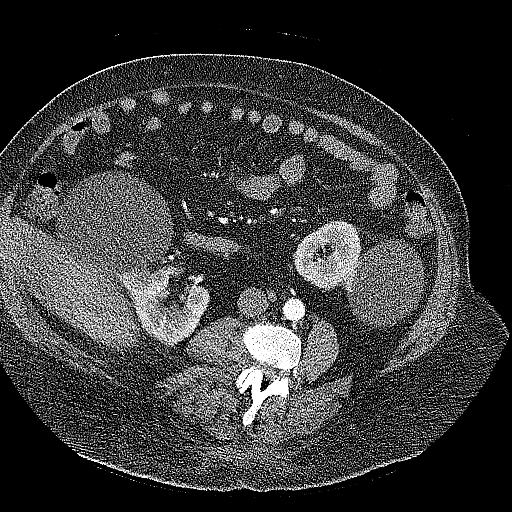
[im 104/208  lung]
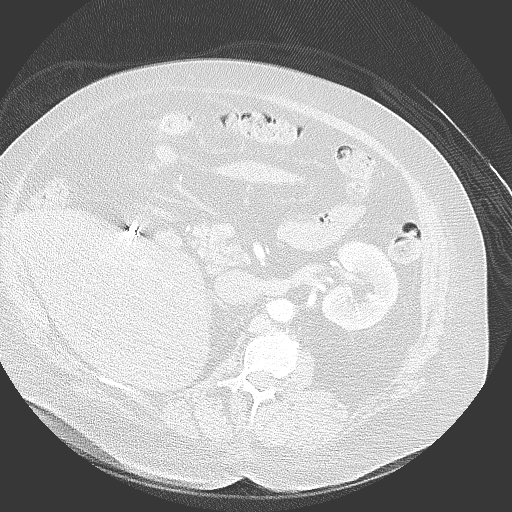
[im 125/208  mediastinal]
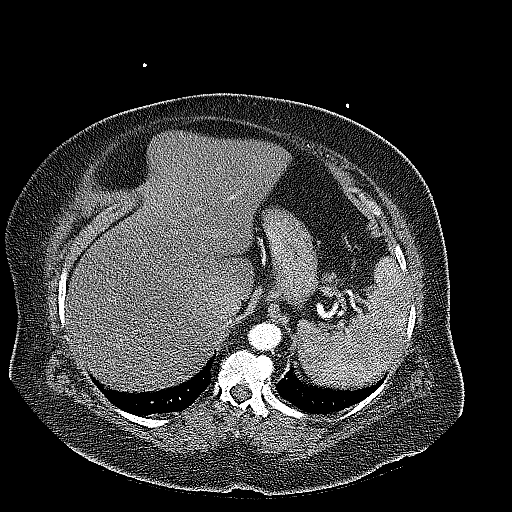
[im 145/208  lung]
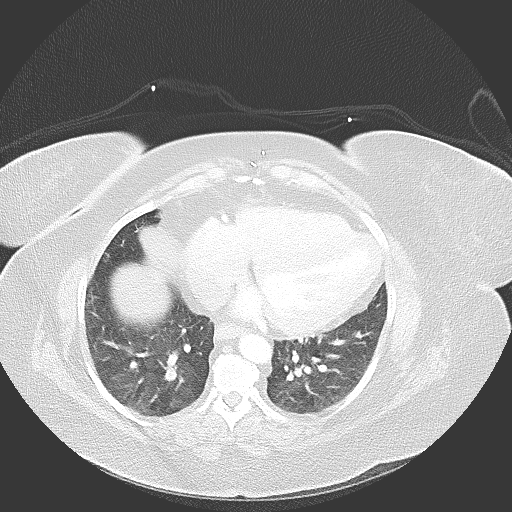
[im 166/208  mediastinal]
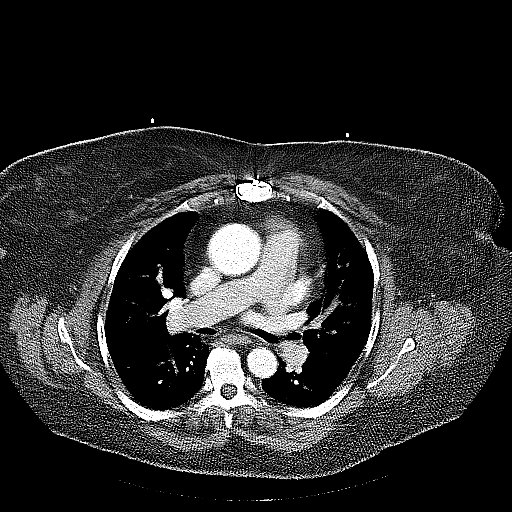
[im 187/208  lung]
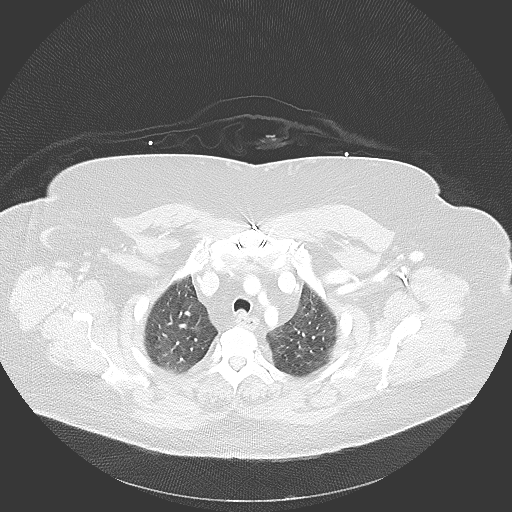

[9 of 36 positions shown; findings below may reference images not displayed]

FINDINGS: CTA CHEST FINDINGS

Cardiovascular: Heart size is mildly enlarged. There is no
significant pericardial fluid, thickening or pericardial
calcification. Mild aortic atherosclerosis. No definite coronary
artery calcifications. Status post median sternotomy for aortic
valve replacement with what appears to be a stented bioprosthesis.
Mild aneurysmal dilatation of the ascending thoracic aorta (4.5 cm
in diameter).

Mediastinum/Lymph Nodes: No pathologically enlarged mediastinal or
hilar lymph nodes. Esophagus is unremarkable. No axillary
lymphadenopathy.

Lungs/Pleura: Patchy areas of mild ground-glass attenuation and
interlobular septal thickening in the lungs bilaterally suggestive
of very mild interstitial pulmonary edema. No confluent
consolidative airspace disease. No pleural effusions. No suspicious
appearing pulmonary nodules or masses are noted.

Musculoskeletal/Soft Tissues: Status post median sternotomy. There
are no aggressive appearing lytic or blastic lesions noted in the
visualized portions of the skeleton.

CTA ABDOMEN AND PELVIS FINDINGS

Hepatobiliary: Liver is enlarged measuring 22.2 cm in craniocaudal
span. Diffuse low attenuation throughout the hepatic parenchyma,
indicative of severe hepatic steatosis. No definite suspicious
cystic or solid hepatic lesions. No intra or extrahepatic biliary
ductal dilatation. Status post cholecystectomy.

Pancreas: No pancreatic mass. No pancreatic ductal dilatation. No
pancreatic or peripancreatic fluid collections or inflammatory
changes.

Spleen: Unremarkable.

Adrenals/Urinary Tract: Low-attenuation lesions in both kidneys,
compatible with simple cysts, largest of which is an exophytic
lesion in the lateral aspect of the interpolar region of the right
kidney measuring 10.8 cm in diameter. No suspicious renal lesions.
No hydroureteronephrosis. Urinary bladder is normal in appearance.
Bilateral adrenal glands are normal in appearance.

Stomach/Bowel: Normal appearance of the stomach. No pathologic
dilatation of small bowel or colon. A few scattered colonic
diverticulae are noted, without surrounding inflammatory changes to
suggest an acute diverticulitis at this time. Normal appendix.

Vascular/Lymphatic: Minimal aortic atherosclerosis, without evidence
of aneurysm or dissection in the abdominal or pelvic vasculature.
Vascular findings and measurements pertinent to potential TAVR
procedure, as detailed below. No abdominal lymphadenopathy. Multiple
prominent borderline enlarged and mildly enlarged pelvic lymph nodes
are noted, largest of which is in the left obturator own nodal
station measuring 1.7 cm in short axis (axial image 171 of series
15).

Reproductive: Uterus and ovaries are unremarkable in appearance.

Other: No significant volume of ascites.  No pneumoperitoneum.

Musculoskeletal: Levoscoliosis of the lumbar spine convex to the
left at the level of L3. There are no aggressive appearing lytic or
blastic lesions noted in the visualized portions of the skeleton.

VASCULAR MEASUREMENTS PERTINENT TO TAVR:

AORTA:

Minimal Aortic [50] x 16 mm

Severity of Aortic Calcification-minimal

RIGHT PELVIS:

Right Common Iliac Artery -

Minimal [50] x 10.7 mm

Tortuosity-moderate

Calcification-minimal

Right External Iliac Artery -

Minimal [50] x 9.2 mm

Tortuosity-moderate

Calcification - none

Right Common Femoral Artery -

Minimal [50] x 9.5 mm

Tortuosity-mild

Calcification - none

LEFT PELVIS:

Left Common Iliac Artery -

Minimal [50] x 11.1 mm

Tortuosity-moderate to severe

Calcification - none

Left External Iliac Artery -

Minimal [50] x 9.4 mm

Tortuosity-moderate to severe

Calcification - none

Left Common Femoral Artery -

Minimal [50] x 9.2 mm

Tortuosity-mild

Calcification-none

Review of the MIP images confirms the above findings.
IMPRESSION: 1. Vascular findings and measurements pertinent to potential TAVR
procedure, as detailed above.
2. Status post median sternotomy for aortic valve replacement with a
stented bioprosthesis.
3. Aortic atherosclerosis with mild aneurysmal dilatation of the
ascending thoracic aorta (4.5 cm in diameter). Ascending thoracic
aortic aneurysm. Recommend semi-annual imaging followup by CTA or
MRA and referral to cardiothoracic surgery if not already obtained.
This recommendation follows [50]
ACCF/AHA/AATS/ACR/ASA/SCA/DERCZE/DERCZE/DERCZE/DERCZE Guidelines for the
Diagnosis and Management of Patients With Thoracic Aortic Disease.
Circulation. [50]; 121: [id number]. Aortic aneurysm NOS ([50]-[50])
4. Mild cardiomegaly with evidence of very mild interstitial
pulmonary edema, which may suggest mild congestive heart failure.
5. Multiple prominent borderline enlarged and mildly enlarged pelvic
lymph nodes bilaterally (left greater than right). These are
nonspecific, but if there is any clinical concern for
lymphoproliferative disorder further outpatient clinical evaluation
would be suggested, with potential follow-up CT the abdomen and
pelvis with contrast in 3-6 months to ensure the stability or
resolution of these findings.
6. Hepatomegaly with hepatic steatosis.
7. Mild colonic diverticulosis without evidence of acute
diverticulitis at this time.
8. Additional incidental findings, as above.

## 2019-05-20 MED ORDER — IOHEXOL 350 MG/ML SOLN
100.0000 mL | Freq: Once | INTRAVENOUS | Status: AC | PRN
  Administered 2019-05-20: 100 mL via INTRAVENOUS

## 2019-05-25 ENCOUNTER — Telehealth: Payer: Self-pay | Admitting: Cardiology

## 2019-05-25 NOTE — Telephone Encounter (Signed)
Please calll patient with results of CT

## 2019-05-26 ENCOUNTER — Telehealth: Payer: Self-pay | Admitting: Cardiology

## 2019-05-26 NOTE — Telephone Encounter (Signed)
Dr Harriet Masson phoned patient with Cardiac CTA results.

## 2019-05-26 NOTE — Telephone Encounter (Signed)
Wants CT results °

## 2019-05-26 NOTE — Telephone Encounter (Signed)
Duplicate See previous encounter

## 2019-05-27 DIAGNOSIS — L03039 Cellulitis of unspecified toe: Secondary | ICD-10-CM | POA: Insufficient documentation

## 2019-05-27 DIAGNOSIS — S91209A Unspecified open wound of unspecified toe(s) with damage to nail, initial encounter: Secondary | ICD-10-CM | POA: Insufficient documentation

## 2019-06-03 ENCOUNTER — Emergency Department (HOSPITAL_COMMUNITY): Payer: BC Managed Care – PPO

## 2019-06-03 ENCOUNTER — Inpatient Hospital Stay (HOSPITAL_COMMUNITY)
Admission: EM | Admit: 2019-06-03 | Discharge: 2019-06-09 | DRG: 315 | Disposition: A | Discharge status: Home / Self Care | Payer: BC Managed Care – PPO | Attending: Internal Medicine | Admitting: Internal Medicine

## 2019-06-03 ENCOUNTER — Other Ambulatory Visit: Payer: Self-pay

## 2019-06-03 ENCOUNTER — Encounter (HOSPITAL_COMMUNITY): Payer: Self-pay | Admitting: Emergency Medicine

## 2019-06-03 DIAGNOSIS — Z8249 Family history of ischemic heart disease and other diseases of the circulatory system: Secondary | ICD-10-CM

## 2019-06-03 DIAGNOSIS — I890 Lymphedema, not elsewhere classified: Secondary | ICD-10-CM | POA: Diagnosis present

## 2019-06-03 DIAGNOSIS — I071 Rheumatic tricuspid insufficiency: Secondary | ICD-10-CM | POA: Diagnosis present

## 2019-06-03 DIAGNOSIS — D649 Anemia, unspecified: Secondary | ICD-10-CM

## 2019-06-03 DIAGNOSIS — I5032 Chronic diastolic (congestive) heart failure: Secondary | ICD-10-CM | POA: Diagnosis not present

## 2019-06-03 DIAGNOSIS — Z9049 Acquired absence of other specified parts of digestive tract: Secondary | ICD-10-CM

## 2019-06-03 DIAGNOSIS — I350 Nonrheumatic aortic (valve) stenosis: Secondary | ICD-10-CM | POA: Diagnosis not present

## 2019-06-03 DIAGNOSIS — D500 Iron deficiency anemia secondary to blood loss (chronic): Secondary | ICD-10-CM | POA: Diagnosis present

## 2019-06-03 DIAGNOSIS — D509 Iron deficiency anemia, unspecified: Secondary | ICD-10-CM

## 2019-06-03 DIAGNOSIS — R29818 Other symptoms and signs involving the nervous system: Secondary | ICD-10-CM

## 2019-06-03 DIAGNOSIS — N938 Other specified abnormal uterine and vaginal bleeding: Secondary | ICD-10-CM | POA: Diagnosis present

## 2019-06-03 DIAGNOSIS — I10 Essential (primary) hypertension: Secondary | ICD-10-CM | POA: Diagnosis present

## 2019-06-03 DIAGNOSIS — R739 Hyperglycemia, unspecified: Secondary | ICD-10-CM | POA: Diagnosis present

## 2019-06-03 DIAGNOSIS — Z79899 Other long term (current) drug therapy: Secondary | ICD-10-CM

## 2019-06-03 DIAGNOSIS — T368X5A Adverse effect of other systemic antibiotics, initial encounter: Secondary | ICD-10-CM | POA: Diagnosis not present

## 2019-06-03 DIAGNOSIS — G4733 Obstructive sleep apnea (adult) (pediatric): Secondary | ICD-10-CM | POA: Diagnosis present

## 2019-06-03 DIAGNOSIS — E876 Hypokalemia: Secondary | ICD-10-CM | POA: Diagnosis present

## 2019-06-03 DIAGNOSIS — I2510 Atherosclerotic heart disease of native coronary artery without angina pectoris: Secondary | ICD-10-CM

## 2019-06-03 DIAGNOSIS — T82867A Thrombosis of cardiac prosthetic devices, implants and grafts, initial encounter: Principal | ICD-10-CM | POA: Diagnosis present

## 2019-06-03 DIAGNOSIS — G459 Transient cerebral ischemic attack, unspecified: Secondary | ICD-10-CM

## 2019-06-03 DIAGNOSIS — L299 Pruritus, unspecified: Secondary | ICD-10-CM | POA: Diagnosis not present

## 2019-06-03 DIAGNOSIS — R29700 NIHSS score 0: Secondary | ICD-10-CM | POA: Diagnosis present

## 2019-06-03 DIAGNOSIS — R200 Anesthesia of skin: Secondary | ICD-10-CM | POA: Diagnosis present

## 2019-06-03 DIAGNOSIS — Z7982 Long term (current) use of aspirin: Secondary | ICD-10-CM

## 2019-06-03 DIAGNOSIS — Q211 Atrial septal defect: Secondary | ICD-10-CM

## 2019-06-03 DIAGNOSIS — R4781 Slurred speech: Secondary | ICD-10-CM | POA: Diagnosis present

## 2019-06-03 DIAGNOSIS — I130 Hypertensive heart and chronic kidney disease with heart failure and stage 1 through stage 4 chronic kidney disease, or unspecified chronic kidney disease: Secondary | ICD-10-CM | POA: Diagnosis present

## 2019-06-03 DIAGNOSIS — Z954 Presence of other heart-valve replacement: Secondary | ICD-10-CM

## 2019-06-03 DIAGNOSIS — E871 Hypo-osmolality and hyponatremia: Secondary | ICD-10-CM | POA: Diagnosis not present

## 2019-06-03 DIAGNOSIS — I491 Atrial premature depolarization: Secondary | ICD-10-CM | POA: Diagnosis not present

## 2019-06-03 DIAGNOSIS — I712 Thoracic aortic aneurysm, without rupture: Secondary | ICD-10-CM | POA: Diagnosis present

## 2019-06-03 DIAGNOSIS — Z7901 Long term (current) use of anticoagulants: Secondary | ICD-10-CM

## 2019-06-03 DIAGNOSIS — Z820 Family history of epilepsy and other diseases of the nervous system: Secondary | ICD-10-CM

## 2019-06-03 DIAGNOSIS — N183 Chronic kidney disease, stage 3 (moderate): Secondary | ICD-10-CM | POA: Diagnosis present

## 2019-06-03 DIAGNOSIS — Y718 Miscellaneous cardiovascular devices associated with adverse incidents, not elsewhere classified: Secondary | ICD-10-CM | POA: Diagnosis present

## 2019-06-03 DIAGNOSIS — Z23 Encounter for immunization: Secondary | ICD-10-CM

## 2019-06-03 DIAGNOSIS — M6281 Muscle weakness (generalized): Secondary | ICD-10-CM | POA: Diagnosis present

## 2019-06-03 DIAGNOSIS — R269 Unspecified abnormalities of gait and mobility: Secondary | ICD-10-CM | POA: Diagnosis present

## 2019-06-03 DIAGNOSIS — Z6841 Body Mass Index (BMI) 40.0 and over, adult: Secondary | ICD-10-CM

## 2019-06-03 DIAGNOSIS — L03116 Cellulitis of left lower limb: Secondary | ICD-10-CM | POA: Diagnosis present

## 2019-06-03 DIAGNOSIS — Z953 Presence of xenogenic heart valve: Secondary | ICD-10-CM

## 2019-06-03 DIAGNOSIS — R791 Abnormal coagulation profile: Secondary | ICD-10-CM

## 2019-06-03 DIAGNOSIS — Z79890 Hormone replacement therapy (postmenopausal): Secondary | ICD-10-CM

## 2019-06-03 DIAGNOSIS — E039 Hypothyroidism, unspecified: Secondary | ICD-10-CM | POA: Diagnosis present

## 2019-06-03 DIAGNOSIS — R5381 Other malaise: Secondary | ICD-10-CM | POA: Diagnosis present

## 2019-06-03 DIAGNOSIS — I471 Supraventricular tachycardia: Secondary | ICD-10-CM | POA: Diagnosis not present

## 2019-06-03 DIAGNOSIS — I421 Obstructive hypertrophic cardiomyopathy: Secondary | ICD-10-CM | POA: Diagnosis present

## 2019-06-03 DIAGNOSIS — Z20828 Contact with and (suspected) exposure to other viral communicable diseases: Secondary | ICD-10-CM | POA: Diagnosis present

## 2019-06-03 DIAGNOSIS — N950 Postmenopausal bleeding: Secondary | ICD-10-CM | POA: Diagnosis present

## 2019-06-03 HISTORY — DX: Atherosclerotic heart disease of native coronary artery without angina pectoris: I25.10

## 2019-06-03 HISTORY — DX: Essential (primary) hypertension: I10

## 2019-06-03 HISTORY — DX: Anemia, unspecified: D64.9

## 2019-06-03 HISTORY — DX: Other symptoms and signs involving the nervous system: R29.818

## 2019-06-03 HISTORY — DX: Chronic diastolic (congestive) heart failure: I50.32

## 2019-06-03 LAB — COMPREHENSIVE METABOLIC PANEL
ALT: 18 U/L (ref 0–44)
AST: 23 U/L (ref 15–41)
Albumin: 2.9 g/dL — ABNORMAL LOW (ref 3.5–5.0)
Alkaline Phosphatase: 41 U/L (ref 38–126)
Anion gap: 9 (ref 5–15)
BUN: 11 mg/dL (ref 8–23)
CO2: 23 mmol/L (ref 22–32)
Calcium: 9 mg/dL (ref 8.9–10.3)
Chloride: 103 mmol/L (ref 98–111)
Creatinine, Ser: 0.96 mg/dL (ref 0.44–1.00)
GFR calc Af Amer: >60 mL/min (ref >60)
GFR calc non Af Amer: >60 mL/min (ref >60)
Glucose, Bld: 184 mg/dL — ABNORMAL HIGH (ref 70–99)
Potassium: 3.8 mmol/L (ref 3.5–5.1)
Sodium: 135 mmol/L (ref 135–145)
Total Bilirubin: 0.4 mg/dL (ref 0.3–1.2)
Total Protein: 7.2 g/dL (ref 6.5–8.1)

## 2019-06-03 LAB — CBC
HCT: 31.6 % — ABNORMAL LOW (ref 36.0–46.0)
Hemoglobin: 8.4 g/dL — ABNORMAL LOW (ref 12.0–15.0)
MCH: 20.5 pg — ABNORMAL LOW (ref 26.0–34.0)
MCHC: 26.6 g/dL — ABNORMAL LOW (ref 30.0–36.0)
MCV: 77.1 fL — ABNORMAL LOW (ref 80.0–100.0)
Platelets: 344 10*3/uL (ref 150–400)
RBC: 4.1 MIL/uL (ref 3.87–5.11)
RDW: 19.1 % — ABNORMAL HIGH (ref 11.5–15.5)
WBC: 11.9 10*3/uL — ABNORMAL HIGH (ref 4.0–10.5)
nRBC: 0 % (ref 0.0–0.2)

## 2019-06-03 LAB — I-STAT CHEM 8, ED
BUN: 11 mg/dL (ref 8–23)
Calcium, Ion: 1.23 mmol/L (ref 1.15–1.40)
Chloride: 104 mmol/L (ref 98–111)
Creatinine, Ser: 0.9 mg/dL (ref 0.44–1.00)
Glucose, Bld: 178 mg/dL — ABNORMAL HIGH (ref 70–99)
HCT: 30 % — ABNORMAL LOW (ref 36.0–46.0)
Hemoglobin: 10.2 g/dL — ABNORMAL LOW (ref 12.0–15.0)
Potassium: 3.9 mmol/L (ref 3.5–5.1)
Sodium: 137 mmol/L (ref 135–145)
TCO2: 23 mmol/L (ref 22–32)

## 2019-06-03 LAB — DIFFERENTIAL
Abs Immature Granulocytes: 0.1 10*3/uL — ABNORMAL HIGH (ref 0.00–0.07)
Basophils Absolute: 0.1 10*3/uL (ref 0.0–0.1)
Basophils Relative: 1 %
Eosinophils Absolute: 0.6 10*3/uL — ABNORMAL HIGH (ref 0.0–0.5)
Eosinophils Relative: 5 %
Immature Granulocytes: 1 %
Lymphocytes Relative: 12 %
Lymphs Abs: 1.5 10*3/uL (ref 0.7–4.0)
Monocytes Absolute: 0.9 10*3/uL (ref 0.1–1.0)
Monocytes Relative: 8 %
Neutro Abs: 8.7 10*3/uL — ABNORMAL HIGH (ref 1.7–7.7)
Neutrophils Relative %: 73 %

## 2019-06-03 LAB — APTT: aPTT: 31 seconds (ref 24–36)

## 2019-06-03 LAB — PROTIME-INR
INR: 1.1 (ref 0.8–1.2)
Prothrombin Time: 13.7 seconds (ref 11.4–15.2)

## 2019-06-03 IMAGING — CT CT HEAD W/O CM
4 series · 17 of 47 positions shown, 19 images · non-contrast
Comparison: None.

CLINICAL DATA: Slurred speech, left arm weakness and numbness.

EXAM:
CT HEAD WITHOUT CONTRAST
TECHNIQUE: Contiguous axial images were obtained from the base of the skull
through the vertex without intravenous contrast.

[Series 2: head without · axial · non-contrast · 0.47mm/px · z∈[-54,+66]mm · 7 of 33 slices shown, 9 images]
[im 5/33  brain]
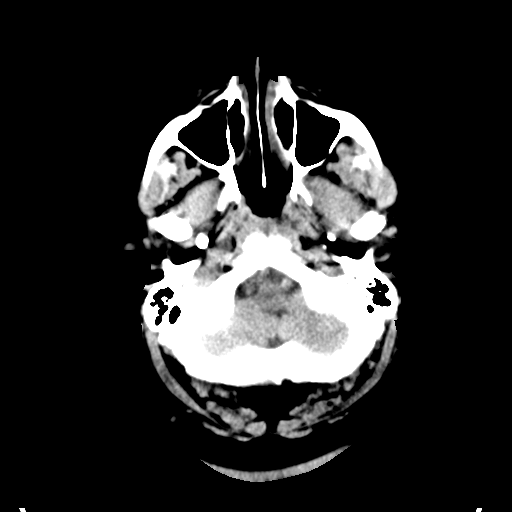
[im 5/33  bone]
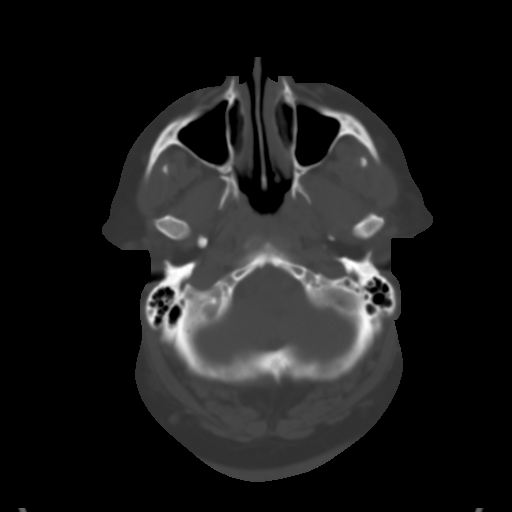
[im 9/33  brain]
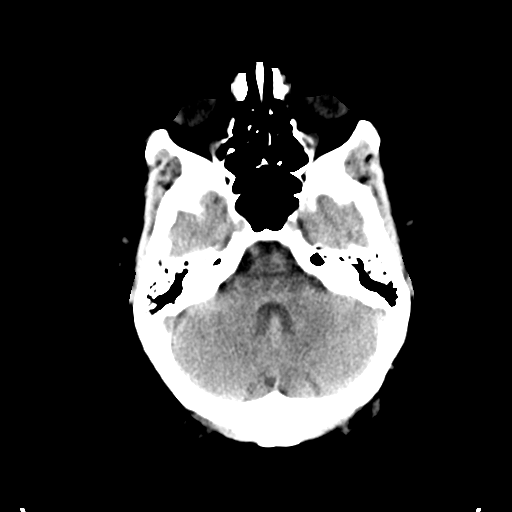
[im 13/33  brain]
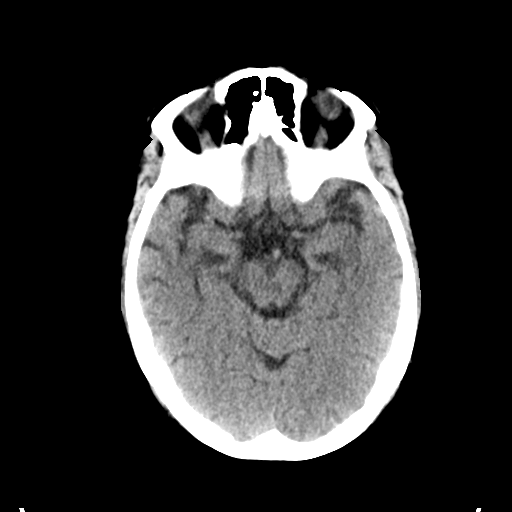
[im 17/33  brain]
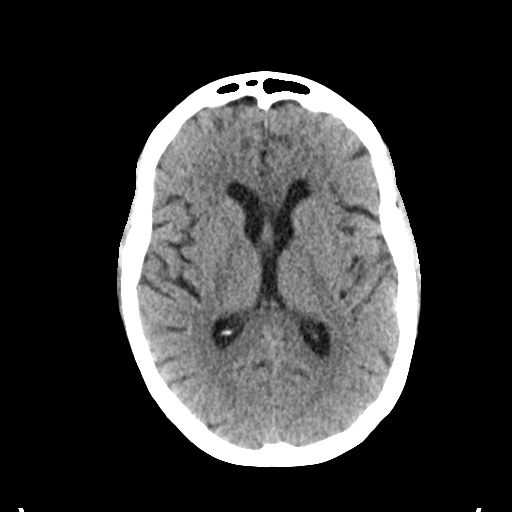
[im 21/33  brain]
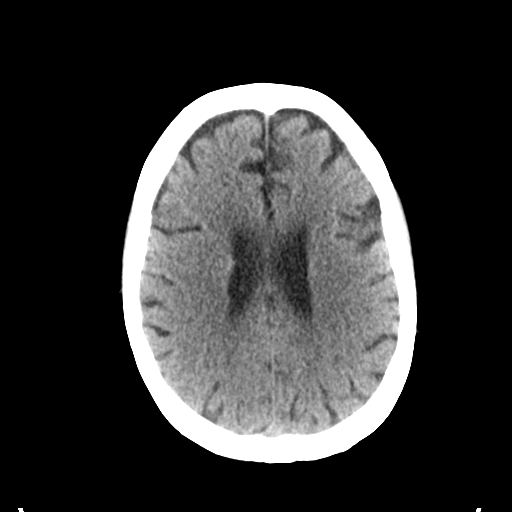
[im 21/33  bone]
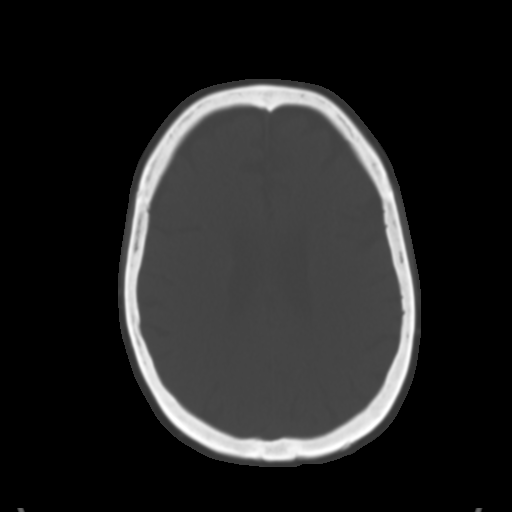
[im 25/33  brain]
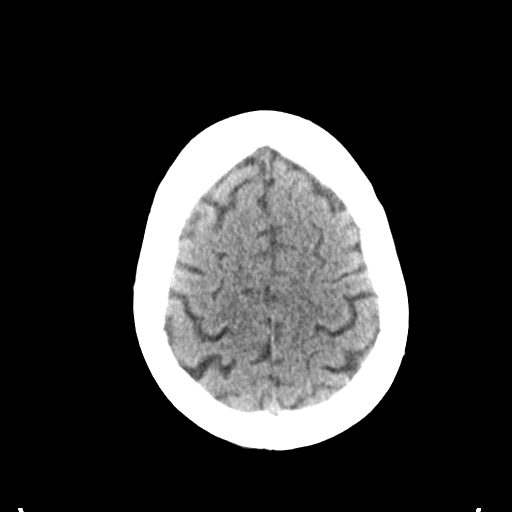
[im 29/33  brain]
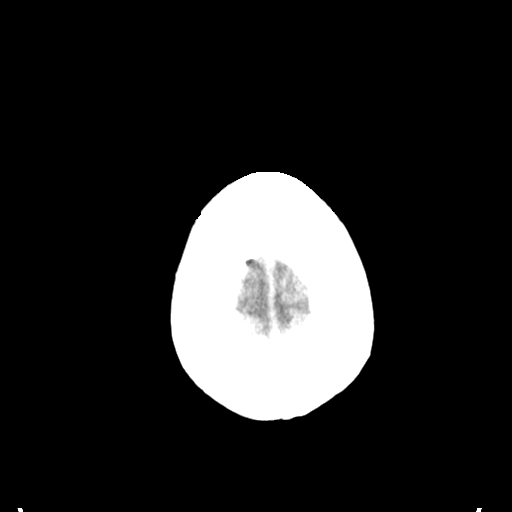

[Series 3: head bone · axial · 0.47mm/px · z∈[-58,-2]mm · 4 of 82 slices shown]
[im 9/82  bone]
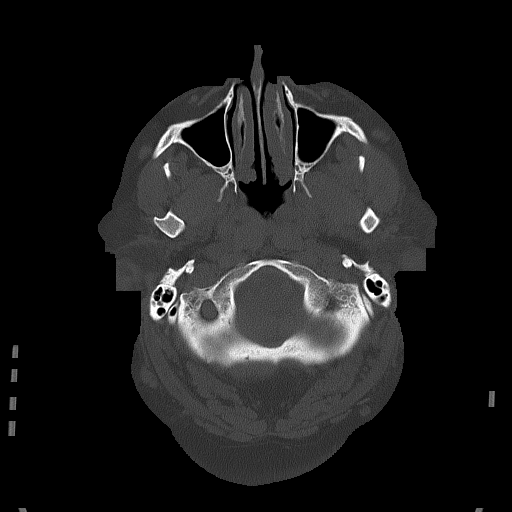
[im 17/82  bone]
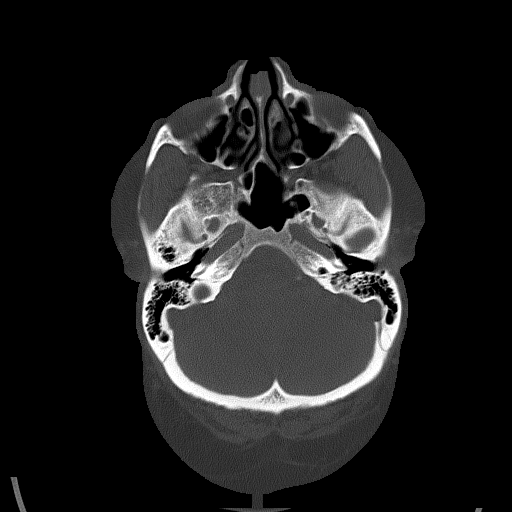
[im 25/82  bone]
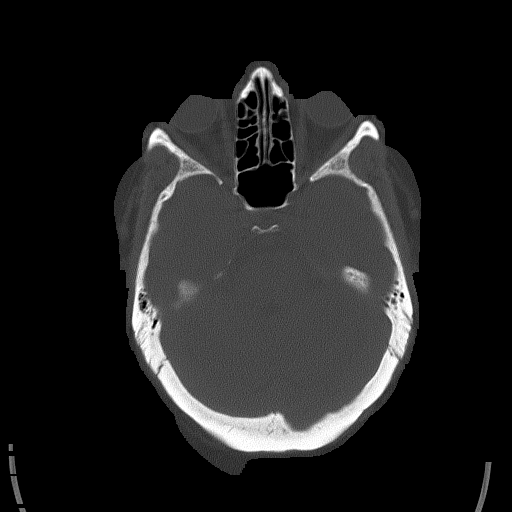
[im 37/82  bone]
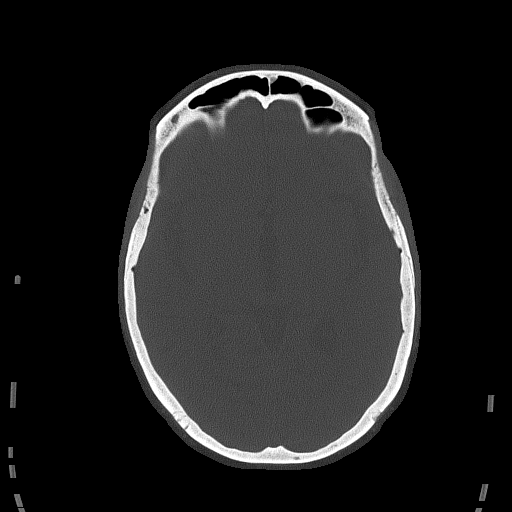

[Series 4: head without cor · coronal · non-contrast · 0.33mm/px · 3 of 72 slices shown]
[im 24/72  brain]
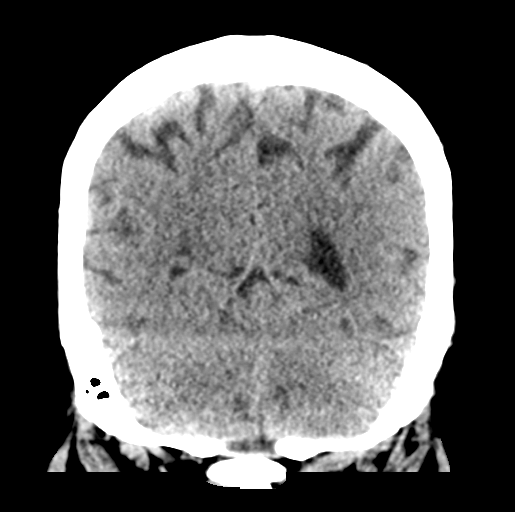
[im 32/72  brain]
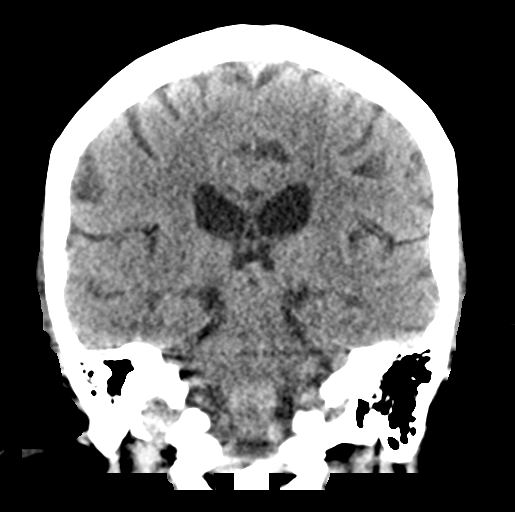
[im 40/72  brain]
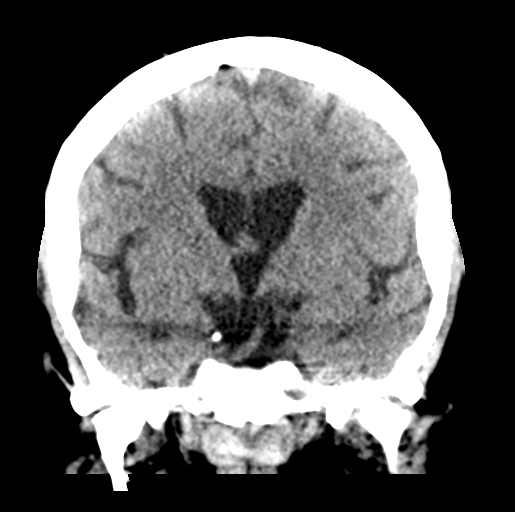

[Series 5: head without sag · sagittal · non-contrast · 0.34mm/px · 3 of 61 slices shown]
[im 21/61  brain]
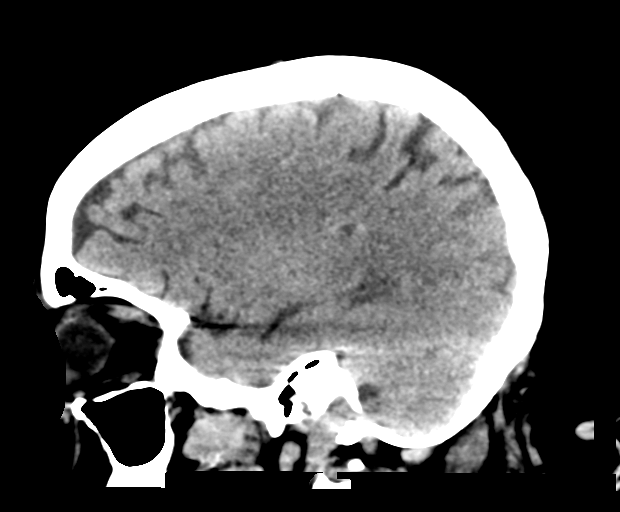
[im 31/61  brain]
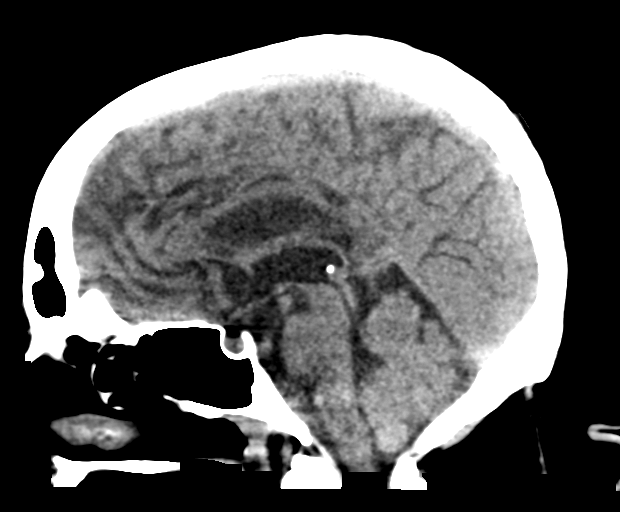
[im 41/61  brain]
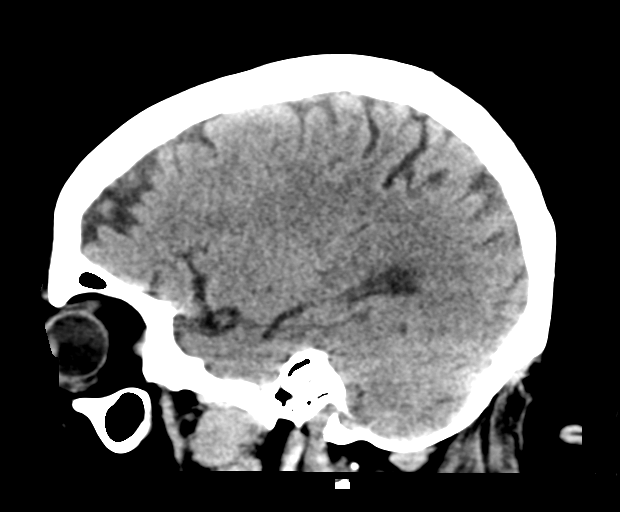

[17 of 47 positions shown; findings below may reference images not displayed]

FINDINGS: Brain: No acute intracranial abnormality. Specifically, no
hemorrhage, hydrocephalus, mass lesion, acute infarction, or
significant intracranial injury.

Vascular: No hyperdense vessel or unexpected calcification.

Skull: No acute calvarial abnormality.

Sinuses/Orbits: Visualized paranasal sinuses and mastoids clear.
Orbital soft tissues unremarkable.

Other: None
IMPRESSION: No intracranial abnormality.

## 2019-06-03 MED ORDER — SODIUM CHLORIDE 0.9% FLUSH
3.0000 mL | Freq: Once | INTRAVENOUS | Status: DC
Start: 2019-06-03 — End: 2019-06-09

## 2019-06-03 NOTE — ED Notes (Signed)
[name] (Husband# [id number]) called/would like call back on her status.

## 2019-06-03 NOTE — ED Triage Notes (Signed)
Pt states she had just finished eating lunch at 2;30pm today and suddenly had slurred speech and left arm weakness/numbness. Pt states it lasted a few moments. Pt states she now feels back to normal. Pt speech is clear, neuro intact.

## 2019-06-03 NOTE — H&P (Signed)
History and Physical    [name] [id number] DOB: 1956-07-13 DOA: 06/03/2019  PCP: [name], MD   Patient coming from: Home   Chief Complaint: Speech difficulty and numbness and weakness involving LUE   HPI: [name] is a 63 y.o. 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  Patient reports that she had been in her usual state of health and was having an uneventful day when she was eating lunch at approximately 2:30 PM and had acute onset of speech difficulty and left arm numbness and weakness.  She reports having difficulty getting the words out, felt as though her speech was slurred, and experienced both numbness and severe weakness involving the left arm.  Symptoms lasted roughly 15 minutes before resolving and she reports feeling back to her baseline in the ED.  She denies similar symptoms previously but her husband at the bedside notes that she will occasionally slur her words and he suspected that it was due to patient being sleepy at those times.  Patient denies any chest pain or palpitations and denies any recent fevers, cough, or shortness of breath.  She had never experienced the left arm numbness and weakness previously.  Denies any change in vision or hearing or other focal numbness or weakness.  ED Course: Upon arrival to the ED, patient is found to be afebrile, saturating well on room air, and with stable blood pressure.  EKG features a sinus rhythm and noncontrast head CT is negative for acute intracranial abnormality.  Chemistry panel is notable for albumin of 2.9 and glucose 184.  CBC  features a leukocytosis to 11,900 and a microcytic anemia with hemoglobin 8.4.  COVID-19 testing is in process.  Neurology was consulted by the ED physician.  Review of Systems:  All other systems reviewed and apart from HPI, are negative.  Past Medical History:  Diagnosis Date  . Aortic stenosis 03/23/2013   Overview:  02/18/13 TTE EF >55%. Critical AS with mean Ao valve gradient of 82 mm Hg. No AI. No MR, PR, mild TR. Estimated RVSP 30 mm Hg.  . Bicuspid aortic valve   . Edema of both legs 02/20/2017  . 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 Heart failure ([place])   . HTN (hypertension) 03/23/2013  . 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 Morbid obesity ([place]) 02/20/2017  . Sleep apnea     Past Surgical History:  Procedure Laterality Date  . CHOLECYSTECTOMY  1983  . RIGHT/LEFT HEART CATH AND CORONARY ANGIOGRAPHY N/A 04/01/2019   Procedure: RIGHT/LEFT HEART CATH AND CORONARY ANGIOGRAPHY;  Surgeon: Lorretta Harp, MD;  Location: [facility];  Service: Cardiovascular;  Laterality: N/A;  . TEE WITHOUT CARDIOVERSION N/A 07/01/2018   Procedure: TRANSESOPHAGEAL ECHOCARDIOGRAM (TEE);  Surgeon: Buford Dresser, MD;  Location[facility] ENDOSCOPY;  Service: Cardiovascular;  Laterality: N/A;  . TISSUE AORTIC VALVE REPLACEMENT       reports that she has never smoked. She has never used smokeless tobacco. She reports that she does not drink alcohol or use drugs.  No Known Allergies  Family History  Problem Relation Age of Onset  . Parkinson's disease Father   . Heart attack Mother  [name] to Admission medications   Medication Sig Start Date End Date Taking? Authorizing Provider  aspirin EC 81 MG tablet Take 81 mg by mouth daily.   Yes [provider]  metoprolol tartrate (LOPRESSOR) 50 MG tablet Take 1 tablet by mouth twice daily 03/23/19  Yes Munley, Hilton Cork, MD  norethindrone (AYGESTIN) 5 MG tablet Take 5 mg by mouth 2 (two) times daily.    Yes [provider]  potassium chloride SA (K-DUR) 10 MEQ tablet Take 1 tablet (10 mEq total) by mouth daily. 04/03/19  Yes Kroeger, Daleen Snook M., PA-C  pravastatin (PRAVACHOL) 20 MG tablet Take 20 mg by mouth at bedtime.    Yes [provider]  telmisartan (MICARDIS) 20 MG tablet Take 1 tablet (20 mg total) by mouth daily. 03/23/19  Yes Richardo Priest, MD  torsemide (DEMADEX) 20 MG tablet Take 1 tablet (20 mg)  by mouth once daily. Patient taking differently: Take 20 mg by mouth daily. Unless weight changes by 3 lbs from day to day. 04/02/19  Yes Abigail Butts., PA-C    Physical Exam: Vitals:   06/03/19 2130 06/03/19 2131 06/03/19 2145 06/03/19 2200  BP: (!) 142/78 (!) 142/78 140/72 (!) 143/73  Pulse: 93 91 88 92  Resp:  18  16  Temp:      TempSrc:      SpO2: 96% 99% 99% 97%  Weight:      Height:        Constitutional: NAD, calm  Eyes: PERTLA, lids and conjunctivae normal ENMT: Mucous membranes are moist. Posterior pharynx clear of any exudate or lesions.   Neck: normal, supple, no masses, no thyromegaly Respiratory: no wheezing, no crackles. Normal respiratory effort. No accessory muscle use.  Cardiovascular: S1 & S2 heard, regular rate and rhythm. Marked bilateral LE edema. Abdomen: No distension, no tenderness, no masses palpated. Bowel sounds normal.  Musculoskeletal: no clubbing / cyanosis. No joint deformity upper and lower extremities.   Skin: no significant rashes, lesions, ulcers. Erythema, heat, and tenderness involving lower LLE with maceration and serous weeping. Neurologic: CN 2-12 grossly intact. Sensation intact. Strength 5/5 in all 4 limbs.  Psychiatric: Alert and oriented to person, place, and situation. Pleasant, cooperative.    Labs on Admission: I have personally reviewed following labs and imaging studies  CBC: Recent Labs  Lab 06/03/19 1618 06/03/19 1633  WBC 11.9*  --   NEUTROABS 8.7*  --   HGB 8.4* 10.2*  HCT 31.6* 30.0*  MCV 77.1*  --   PLT  344  --    Basic Metabolic Panel: Recent Labs  Lab 06/03/19 1618 06/03/19 1633  NA 135 137  K 3.8 3.9  CL 103 104  CO2 23  --   GLUCOSE 184* 178*  BUN 11 11  CREATININE 0.96 0.90  CALCIUM 9.0  --    GFR: Estimated Creatinine Clearance: 81.3 mL/min (by C-G formula based on SCr of 0.9 mg/dL). Liver Function Tests: Recent Labs  Lab 06/03/19 1618  AST 23  ALT 18  ALKPHOS 41  BILITOT 0.4  PROT 7.2  ALBUMIN 2.9*   No results for input(s): LIPASE, AMYLASE in the last 168 hours. No results for input(s): AMMONIA in the last 168 hours. Coagulation Profile: Recent Labs  Lab 06/03/19 1618  INR 1.1   Cardiac Enzymes: No results for input(s): CKTOTAL, CKMB, CKMBINDEX, TROPONINI in the last 168 hours. BNP (last 3 results) Recent Labs    06/26/18 1352 08/14/18 1058 02/12/19 1116  PROBNP 698* 876* 640*   HbA1C: No results for input(s): HGBA1C in the last 72 hours. CBG: No results for input(s): GLUCAP in the last 168 hours. Lipid Profile: No results for input(s): CHOL, HDL, LDLCALC, TRIG, CHOLHDL, LDLDIRECT in the last 72 hours. Thyroid Function Tests: No results for input(s): TSH, T4TOTAL, FREET4, T3FREE, THYROIDAB in the last 72 hours. Anemia Panel: No results for input(s): VITAMINB12, FOLATE, FERRITIN, TIBC, IRON, RETICCTPCT in the last 72 hours. Urine analysis: No results found for: COLORURINE, APPEARANCEUR, LABSPEC, PHURINE, GLUCOSEU, HGBUR, BILIRUBINUR, KETONESUR, PROTEINUR, UROBILINOGEN, NITRITE, LEUKOCYTESUR Sepsis Labs: @LABRCNTIP (procalcitonin:4,lacticidven:4) )No results found for this or any previous visit (from the past 240 hour(s)).   Radiological Exams on Admission: Ct Head Wo Contrast  Result Date: 06/03/2019 CLINICAL DATA:  Slurred speech, left arm weakness and numbness. EXAM: CT HEAD WITHOUT CONTRAST TECHNIQUE: Contiguous axial images were obtained from the base of the skull through the vertex without intravenous contrast. COMPARISON:  None. FINDINGS:  Brain: No acute intracranial abnormality. Specifically, no hemorrhage, hydrocephalus, mass lesion, acute infarction, or significant intracranial injury. Vascular: No hyperdense vessel or unexpected calcification. Skull: No acute calvarial abnormality. Sinuses/Orbits: Visualized paranasal sinuses and mastoids clear. Orbital soft tissues unremarkable. Other: None IMPRESSION: No intracranial abnormality. Electronically Signed   By: Rolm Baptise M.D.   On: 06/03/2019 18:41    EKG: Independently reviewed. Sinus rhythm, lateral ST-T wave abnormalities appear similar to [name].   Assessment/Plan   1. Transient neurologic deficits  - Presents after experiencing speech difficultly and LUE numbness and weakness that occurred at ~14:30 resolved after several minutes  - No acute findings on head CT  - Neurology is consulting and much appreciated  - Continue cardiac monitoring, frequent neuro checks, NPO pending swallow screen, PT/OT/SLP consultations  - Check MRI brain, CTA head and neck, TTE, A1c, and fasting lipid panel  - Continue ASA and statin    2. Chronic diastolic CHF  - Appears hypervolemic    - SLIV, give one-time dose IV Lasix now and then continue oral torsemide, daily wts    3. Aortic stenosis  - She is s/p bioprosthetic valve replacement, being considered for revision, echo is ordered as above   4. Hypertension  - BP at goal, hold antihypertensives initially while evaluating of possible acute ischemic CVA    5. Microcytic anemia; dysfunctional uterine bleeding  - Hgb appears stable at 8.4 on admission, attributed to DUB, managed with hormones currently and with pt being considered for D&C   6. CKD II  - SCr is 0.96 in ED, lower than recent priors  - Renally-dose medications, monitor    7. Lymphedema; cellulitis  - Patient has lymphedema involving bilateral LE's, reports recent increase in erythema and tenderness involving lower left leg which is noted to be hot and erythematous with  a few areas of maceration serous weeping  - Start Keflex, continue supportive care    PPE: Mask, face shield  DVT prophylaxis: SCD's  Code Status: Full  Family Communication: Husband updated at bedside  Consults called: Neurology  Admission status: Observation     Vianne Bulls, MD [facility] Pager [phone]  If 7PM-7AM, please contact night-coverage [email or web address] Password [facility]  06/03/2019, 10:48 PM

## 2019-06-03 NOTE — ED Notes (Signed)
ED TO INPATIENT HANDOFF REPORT  ED Nurse Name and Phone #:  Clydene Laming [phone]  S Name/Age/Gender [name] 63 y.o. female Room/Bed: 030C/030C  Code Status   Code Status: Prior  Home/SNF/Other Home {Patient oriented x4 Is this baseline? yes  Triage Complete: Triage complete  Chief Complaint numbness in left arm slurred speech  Triage Note Pt states she had just finished eating lunch at 2;30pm today and suddenly had slurred speech and left arm weakness/numbness. Pt states it lasted a few moments. Pt states she now feels back to normal. Pt speech is clear, neuro intact.    Allergies No Known Allergies  Level of Care/Admitting Diagnosis ED Disposition    ED Disposition Condition [facility] Area: [place] [[id number]]  Level of Care: Telemetry Medical [104]  I expect the patient will be discharged within 24 hours: Yes  LOW acuity---Tx typically complete <24 hrs---ACUTE conditions typically can be evaluated <24 hours---LABS likely to return to acceptable levels <24 hours---IS near functional baseline---EXPECTED to return to current living arrangement---NOT newly hypoxic: Meets criteria for 5C-Observation unit  Covid Evaluation: Asymptomatic Screening Protocol (No Symptoms)  Diagnosis: Transient neurologic deficit [id number]  Admitting Physician: Vianne Bulls [id number]  Attending Physician: Vianne Bulls [id number]  PT Class (Do Not Modify): Observation [104]  PT Acc Code (Do Not Modify): Observation [10022]       B Medical/Surgery History Past Medical History:  Diagnosis Date  . Aortic stenosis 03/23/2013   Overview:  02/18/13 TTE EF >55%. Critical AS with mean Ao valve gradient of 82 mm Hg. No AI. No MR, PR, mild TR. Estimated RVSP 30 mm Hg.  . Bicuspid aortic valve   . Edema of both legs 02/20/2017  . 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 Heart failure ([place])   . HTN (hypertension) 03/23/2013  . Hypertelorism  02/20/2017  . Morbid obesity ([place]) 02/20/2017  . Sleep apnea    Past Surgical History:  Procedure Laterality Date  . CHOLECYSTECTOMY  1983  . RIGHT/LEFT HEART CATH AND CORONARY ANGIOGRAPHY N/A 04/01/2019   Procedure: RIGHT/LEFT HEART CATH AND CORONARY ANGIOGRAPHY;  Surgeon: Lorretta Harp, MD;  Location: [facility];  Service: Cardiovascular;  Laterality: N/A;  . TEE WITHOUT CARDIOVERSION N/A 07/01/2018   Procedure: TRANSESOPHAGEAL ECHOCARDIOGRAM (TEE);  Surgeon: Buford Dresser, MD;  Location: [facility];  Service: Cardiovascular;  Laterality: N/A;  . TISSUE AORTIC VALVE REPLACEMENT       A IV Location/Drains/Wounds Patient Lines/Drains/Airways Status   Active Line/Drains/Airways    Name:   Placement date:   Placement time:   Site:   Days:   Peripheral IV 05/20/19 Left Antecubital   05/20/19    1256    Antecubital   14          Intake/Output Last 24 hours No intake or output data in the 24 hours ending 06/03/19 2256  Labs/Imaging Results for orders placed or performed during the hospital encounter of 06/03/19 (from the past 48 hour(s))  Protime-INR     Status: None   Collection Time: 06/03/19  4:18 PM  Result Value Ref Range   Prothrombin Time 13.7 11.4 - 15.2 seconds   INR 1.1 0.8 - 1.2    Comment: (NOTE) INR goal varies based on device and disease states. Performed at [facility], [place] [address], [place], [place] [address]   APTT     Status: None   Collection Time: 06/03/19  4:18 PM  Result Value Ref Range   aPTT 31 24 - 36 seconds    Comment: Performed at [facility], [place] [address], [place], [place] [address]  CBC     Status: Abnormal   Collection Time: 06/03/19  4:18 PM  Result Value Ref Range   WBC 11.9 (H) 4.0 - 10.5 K/uL   RBC 4.10 3.87 - 5.11 MIL/uL   Hemoglobin 8.4 (L) 12.0 - 15.0 g/dL    Comment: Reticulocyte Hemoglobin testing may be clinically indicated, consider ordering this additional test [id number]    HCT 31.6 (L)  36.0 - 46.0 %   MCV 77.1 (L) 80.0 - 100.0 fL   MCH 20.5 (L) 26.0 - 34.0 pg   MCHC 26.6 (L) 30.0 - 36.0 g/dL   RDW 19.1 (H) 11.5 - 15.5 %   Platelets 344 150 - 400 K/uL   nRBC 0.0 0.0 - 0.2 %    Comment: Performed at [facility], [place] [address], [place], [place] [address]  Differential     Status: Abnormal   Collection Time: 06/03/19  4:18 PM  Result Value Ref Range   Neutrophils Relative % 73 %   Neutro Abs 8.7 (H) 1.7 - 7.7 K/uL   Lymphocytes Relative 12 %   Lymphs Abs 1.5 0.7 - 4.0 K/uL   Monocytes Relative 8 %   Monocytes Absolute 0.9 0.1 - 1.0 K/uL   Eosinophils Relative 5 %   Eosinophils Absolute 0.6 (H) 0.0 - 0.5 K/uL   Basophils Relative 1 %   Basophils Absolute 0.1 0.0 - 0.1 K/uL   Immature Granulocytes 1 %   Abs Immature Granulocytes 0.10 (H) 0.00 - 0.07 K/uL    Comment: Performed at [facility] [address], [place], [place] [address]  Comprehensive metabolic panel     Status: Abnormal   Collection Time: 06/03/19  4:18 PM  Result Value Ref Range   Sodium 135 135 - 145 mmol/L   Potassium 3.8 3.5 - 5.1 mmol/L   Chloride 103 98 - 111 mmol/L   CO2 23 22 - 32 mmol/L   Glucose, Bld 184 (H) 70 - 99 mg/dL   BUN 11 8 - 23 mg/dL   Creatinine, Ser 0.96 0.44 - 1.00 mg/dL   Calcium 9.0 8.9 - 10.3 mg/dL   Total Protein 7.2 6.5 - 8.1 g/dL   Albumin 2.9 (L) 3.5 - 5.0 g/dL   AST 23 15 - 41 U/L   ALT 18 0 - 44 U/L   Alkaline Phosphatase 41 38 - 126 U/L   Total Bilirubin 0.4 0.3 - 1.2 mg/dL   GFR calc non Af Amer >60 >60 mL/min   GFR calc Af Amer >60 >60 mL/min   Anion gap 9 5 - 15    Comment: Performed at [facility] [address] [place], [place] [address]  I-stat chem 8, ED     Status: Abnormal   Collection Time: 06/03/19  4:33 PM  Result Value Ref Range   Sodium 137 135 - 145 mmol/L   Potassium 3.9 3.5 - 5.1 mmol/L   Chloride 104 98 - 111 mmol/L   BUN 11 8 - 23 mg/dL   Creatinine, Ser 0.90 0.44 - 1.00 mg/dL   Glucose, Bld 178 (H) 70 - 99  mg/dL   Calcium, Ion 1.23 1.15 - 1.40 mmol/L   TCO2 23 22 - 32 mmol/L   Hemoglobin 10.2 (L) 12.0 - 15.0 g/dL   HCT  30.0 (L) 36.0 - 46.0 %   Ct Head Wo Contrast  Result Date: 06/03/2019 CLINICAL DATA:  Slurred speech, left arm weakness and numbness. EXAM: CT HEAD WITHOUT CONTRAST TECHNIQUE: Contiguous axial images were obtained from the base of the skull through the vertex without intravenous contrast. COMPARISON:  None. FINDINGS: Brain: No acute intracranial abnormality. Specifically, no hemorrhage, hydrocephalus, mass lesion, acute infarction, or significant intracranial injury. Vascular: No hyperdense vessel or unexpected calcification. Skull: No acute calvarial abnormality. Sinuses/Orbits: Visualized paranasal sinuses and mastoids clear. Orbital soft tissues unremarkable. Other: None IMPRESSION: No intracranial abnormality. Electronically Signed   By: Rolm Baptise M.D.   On: 06/03/2019 18:41    Pending Labs Unresulted Labs (From admission, onward)    Start     Ordered   06/03/19 2207  SARS CORONAVIRUS 2 (TAT 6-24 HRS) Nasopharyngeal Nasopharyngeal Swab  (Asymptomatic/Tier 2 Patients Labs)  Once,   STAT    Question Answer Comment  Is this test for diagnosis or screening Screening   Symptomatic for COVID-19 as defined by CDC No   Hospitalized for COVID-19 No   Admitted to ICU for COVID-19 No   Previously tested for COVID-19 Unknown   Resident in a congregate (group) care setting No   Employed in healthcare setting No   Pregnant No      06/03/19 2206   Signed and Held  Hemoglobin A1c  Tomorrow morning,   R     Signed and Held   Signed and Held  Lipid panel  Tomorrow morning,   R    Comments: Fasting    Signed and Held          Vitals/Pain Today's Vitals   06/03/19 2200 06/03/19 2215 06/03/19 2230 06/03/19 2245  BP: (!) 143/73 (!) 127/59 (!) 120/58 134/71  Pulse: 92 91 85 86  Resp: 16   18  Temp:      TempSrc:      SpO2: 97% 99% 98% 96%  Weight:      Height:       PainSc: 0-No pain   0-No pain    Isolation Precautions No active isolations  Medications Medications  sodium chloride flush (NS) 0.9 % injection 3 mL (3 mLs Intravenous Not Given 06/03/19 2124)    Mobility walks Low fall risk   Focused Assessments Family at bedside   R Recommendations: See Admitting Provider Note  Report given to:   Additional Notes:

## 2019-06-03 NOTE — ED Provider Notes (Signed)
[facility] EMERGENCY DEPARTMENT Provider Note   CSN: [id number] Arrival date & time: 06/03/19  1605     History   Chief Complaint No chief complaint on file.   HPI [name] is a 63 y.o. female presenting for evaluation of speech difficulty and left arm numbness.  Patient states she was at lunch when she had acute onset difficulty with her speech, slurring words, and left arm weakness.  This lasted for several minutes before resolving without intervention.  Patient has been asymptomatic since then, this was several hours ago.  She denies headache, vision changes, chest pain, nausea, vomiting, dumping, urinary symptoms, abnormal bowel movements.  Patient states she has been having difficulty with shortness of breath for the past year, has had multiple rounds of work-up with her doctor without diagnosis.  She recently had a cardiac CT which showed failure of her aortic valve.  She is not on anticoagulation, as it is not a mechanical valve.  Patient reports lymphedema, worse on the left side, this is baseline.  No new increased pain or swelling. Pt states she has been having vaginal bleeding die to uterine fibroid. Was started on hormones to decrease bleeding until she can be cleared for D&C.     HPI  Past Medical History:  Diagnosis Date  . Aortic stenosis 03/23/2013   Overview:  02/18/13 TTE EF >55%. Critical AS with mean Ao valve gradient of 82 mm Hg. No AI. No MR, PR, mild TR. Estimated RVSP 30 mm Hg.  . Bicuspid aortic valve   . Edema of both legs 02/20/2017  . 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 Heart failure ([place])   . HTN (hypertension) 03/23/2013  . 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 Morbid obesity ([place]) 02/20/2017  . Sleep apnea     Patient Active Problem List   Diagnosis Date Noted  . Transient neurologic deficit 06/03/2019  . Microcytic anemia 06/03/2019  . Chronic diastolic CHF (congestive heart failure) ([place]) 06/03/2019  . CAD  (coronary artery disease) 06/03/2019  . Essential hypertension 06/03/2019  . Sleep apnea 09/25/2018  . At risk for obstructive sleep apnea 08/14/2018  . 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 Dysfunctional uterine bleeding 04/22/2018  . Edema of both legs 02/20/2017  . 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 Morbid obesity ([place]) 02/20/2017  . Aortic stenosis 03/23/2013  . Hypertensive heart disease with heart failure ([place]) 03/23/2013    Past Surgical History:  Procedure Laterality Date  . CHOLECYSTECTOMY  1983  . RIGHT/LEFT HEART CATH AND CORONARY ANGIOGRAPHY N/A 04/01/2019   Procedure: RIGHT/LEFT HEART CATH AND CORONARY ANGIOGRAPHY;  Surgeon: Lorretta Harp, MD;  Location: [facility];  Service: Cardiovascular;  Laterality: N/A;  . TEE WITHOUT CARDIOVERSION N/A 07/01/2018   Procedure: TRANSESOPHAGEAL ECHOCARDIOGRAM (TEE);  Surgeon: Buford Dresser, MD;  Location: [facility];  Service: Cardiovascular;  Laterality: N/A;  . TISSUE AORTIC VALVE REPLACEMENT       OB History   No obstetric history on file.      Home Medications    Prior to Admission medications   Medication Sig Start Date End Date Taking? Authorizing Provider  aspirin EC 81 MG tablet Take 81 mg by mouth daily.   Yes [provider]  metoprolol tartrate (LOPRESSOR) 50 MG tablet Take 1 tablet by mouth twice daily 03/23/19  Yes Munley, Hilton Cork, MD  norethindrone (AYGESTIN) 5 MG tablet Take 5 mg by  mouth 2 (two) times daily.    Yes [provider]  potassium chloride SA (K-DUR) 10 MEQ tablet Take 1 tablet (10 mEq total) by mouth daily. 04/03/19  Yes Kroeger, Daleen Snook M., PA-C  pravastatin (PRAVACHOL) 20 MG tablet Take 20 mg by mouth at bedtime.    Yes [provider]  telmisartan (MICARDIS) 20 MG tablet Take 1 tablet (20 mg total) by mouth daily. 03/23/19  Yes Richardo Priest, MD  torsemide (DEMADEX) 20 MG  tablet Take 1 tablet (20 mg)  by mouth once daily. Patient taking differently: Take 20 mg by mouth daily. Unless weight changes by 3 lbs from day to day. 04/02/19  Yes Kroeger, Lorelee Cover., PA-C    Family History Family History  Problem Relation Age of Onset  . Parkinson's disease Father   . Heart attack Mother     Social History Social History   Tobacco Use  . Smoking status: Never Smoker  . Smokeless tobacco: Never Used  Substance Use Topics  . Alcohol use: No    Frequency: Never  . Drug use: No     Allergies   Patient has no known allergies.   Review of Systems Review of Systems  Neurological: Positive for speech difficulty (resolved) and weakness (resolved).  All other systems reviewed and are negative.    Physical Exam Updated Vital Signs BP 128/66 (BP Location: Right Arm)   Pulse 88   Temp 98.5 F (36.9 C) (Oral)   Resp 16   Ht 5\' 2"  (1.575 m)   Wt 128.8 kg   SpO2 97%   BMI 51.94 kg/m   Physical Exam Vitals signs and nursing note reviewed.  Constitutional:      General: She is not in acute distress.    Appearance: She is well-developed.     Comments: Obese female who appears nontoxic  HENT:     Head: Normocephalic and atraumatic.  Eyes:     Conjunctiva/sclera: Conjunctivae normal.     Pupils: Pupils are equal, round, and reactive to light.  Neck:     Musculoskeletal: Normal range of motion and neck supple.  Cardiovascular:     Rate and Rhythm: Normal rate and regular rhythm.     Pulses: Normal pulses.  Pulmonary:     Effort: Pulmonary effort is normal. No respiratory distress.     Breath sounds: Normal breath sounds. No wheezing.  Abdominal:     General: There is no distension.     Palpations: Abdomen is soft. There is no mass.     Tenderness: There is no abdominal tenderness. There is no guarding or rebound.  Musculoskeletal: Normal range of motion.     Right lower leg: Edema present.     Left lower leg: Edema present.     Comments:  Bilateral 2+ pitting edema, worse in the left side.  Mild erythema on the left side, patient states is baseline.  Skin:    General: Skin is warm and dry.     Capillary Refill: Capillary refill takes less than 2 seconds.  Neurological:     General: No focal deficit present.     Mental Status: She is alert and oriented to person, place, and time.     GCS: GCS eye subscore is 4. GCS verbal subscore is 5. GCS motor subscore is 6.     Cranial Nerves: Cranial nerves are intact.     Sensory: Sensation is intact.     Motor: Motor function is intact.  Coordination: Coordination is intact.     Comments: No obvious neurologic deficits.  CN intact.  Nose to finger intact.  Fine movement and coordination intact.  Strength and sensation intact x4.      ED Treatments / Results  Labs (all labs ordered are listed, but only abnormal results are displayed) Labs Reviewed  CBC - Abnormal; Notable for the following components:      Result Value   WBC 11.9 (*)    Hemoglobin 8.4 (*)    HCT 31.6 (*)    MCV 77.1 (*)    MCH 20.5 (*)    MCHC 26.6 (*)    RDW 19.1 (*)    All other components within normal limits  DIFFERENTIAL - Abnormal; Notable for the following components:   Neutro Abs 8.7 (*)    Eosinophils Absolute 0.6 (*)    Abs Immature Granulocytes 0.10 (*)    All other components within normal limits  COMPREHENSIVE METABOLIC PANEL - Abnormal; Notable for the following components:   Glucose, Bld 184 (*)    Albumin 2.9 (*)    All other components within normal limits  I-STAT CHEM 8, ED - Abnormal; Notable for the following components:   Glucose, Bld 178 (*)    Hemoglobin 10.2 (*)    HCT 30.0 (*)    All other components within normal limits  SARS CORONAVIRUS 2 (TAT 6-24 HRS)  PROTIME-INR  APTT  CBG MONITORING, ED    EKG EKG Interpretation  Date/Time:  Thursday June 03 2019 16:10:48 EDT Ventricular Rate:  92 PR Interval:  186 QRS Duration: 92 QT Interval:  348 QTC  Calculation: 430 R Axis:   33 Text Interpretation:  Normal sinus rhythm ST & T wave abnormality, consider lateral ischemia Abnormal ECG ST changes similar to multiple previous  Confirmed by Merrily Pew [phone]) on 06/03/2019 8:55:38 PM   Radiology Ct Head Wo Contrast  Result Date: 06/03/2019 CLINICAL DATA:  Slurred speech, left arm weakness and numbness. EXAM: CT HEAD WITHOUT CONTRAST TECHNIQUE: Contiguous axial images were obtained from the base of the skull through the vertex without intravenous contrast. COMPARISON:  None. FINDINGS: Brain: No acute intracranial abnormality. Specifically, no hemorrhage, hydrocephalus, mass lesion, acute infarction, or significant intracranial injury. Vascular: No hyperdense vessel or unexpected calcification. Skull: No acute calvarial abnormality. Sinuses/Orbits: Visualized paranasal sinuses and mastoids clear. Orbital soft tissues unremarkable. Other: None IMPRESSION: No intracranial abnormality. Electronically Signed   By: Rolm Baptise M.D.   On: 06/03/2019 18:41    Procedures Procedures (including critical care time)  Medications Ordered in ED Medications  sodium chloride flush (NS) 0.9 % injection 3 mL (3 mLs Intravenous Not Given 06/03/19 2124)     Initial Impression / Assessment and Plan / ED Course  I have reviewed the triage vital signs and the nursing notes.  Pertinent labs & imaging results that were available during my care of the patient were reviewed by me and considered in my medical decision making (see chart for details).        Pt presenting for evaluation of transient strokelike symptoms.  Concern for TIA.  Physical exam in the ED is normal.  Labs obtained from triage overall reassuring, does show chronic anemia which is not far from patient's baseline from 2 months ago.  EKG unchanged from previous.  CT head without acute findings.  Will consult with neurology due to concern for TIA.  Discussed with Dr. Rory Percy from neurology, who  evaluated the patient.  Recommends admission to  the hospital, especially considering patient's history of aortic valve dysfunction.  Will call for admission.  Discussed with Dr. Myna Hidalgo from [facility]ad hospitalist service, patient to be admitted.   Final Clinical Impressions(s) / ED Diagnoses   Final diagnoses:  TIA (transient ischemic attack)    ED Discharge Orders    None       Franchot Heidelberg, PA-C 06/03/19 2332    Lucrezia Starch, MD 06/05/19 0230

## 2019-06-03 NOTE — Consult Note (Signed)
Neurology Consultation  Reason for Consult: Transient episode of left-sided weakness and slurred speech Referring Physician: Dr. Roslynn Amble  CC: Transient episode of left-sided weakness and slurred speech  History is obtained from: Chart review, patient  HPI: [name] is a 63 y.o. female significant past medical history of aortic stenosis status post valve replacement, hypertension, morbid obesity, sleep apnea, bilateral lower extremity lymphedema, uterine bleeding currently on hormones, presented to the ER for evaluation of neurological symptoms concerning for stroke. She was eating lunch at around 2:30 PM today and suddenly noticed that her speech has become slurred and garbled at the time.  She also noticed some left arm weakness and numbness.  It lasted about 15 to 20 minutes before returning back to normal.  She has no symptoms at this point but given the focality of the symptoms got concerned and came into the ER for further evaluation. Denies any headaches.  Denies any current chest pain but says that she is short of breath and she has been short of breath for the past year.  She is being evaluated by the cardiology team for her aortic stenosis and said that doctors are considering possible replacement of the prior graft of the aortic valve. Reports worsened lower extremity swelling with oozing clear fluid from multiple small ulcers. Reports feeling cold but no fevers.  LKW: 2:30 PM on 06/03/2019 tpa given?: no, symptoms resolved Premorbid modified Rankin scale (mRS): 1  ROS: ROS was performed and is negative except as noted in the HPI.   Past Medical History:  Diagnosis Date  . Aortic stenosis 03/23/2013   Overview:  02/18/13 TTE EF >55%. Critical AS with mean Ao valve gradient of 82 mm Hg. No AI. No MR, PR, mild TR. Estimated RVSP 30 mm Hg.  . Bicuspid aortic valve   . Edema of both legs 02/20/2017  . 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 Heart failure ([place])    . HTN (hypertension) 03/23/2013  . 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 Morbid obesity ([place]) 02/20/2017  . Sleep apnea      Family History  Problem Relation Age of Onset  . Parkinson's disease Father   . Heart attack Mother    Social History:   reports that she has never smoked. She has never used smokeless tobacco. She reports that she does not drink alcohol or use drugs.  Medications  Current Facility-Administered Medications:  .  sodium chloride flush (NS) 0.9 % injection 3 mL, 3 mL, Intravenous, Once, Dykstra, Ellwood Dense, MD  Current Outpatient Medications:  .  aspirin EC 81 MG tablet, Take 81 mg by mouth daily., Disp: , Rfl:  .  metoprolol tartrate (LOPRESSOR) 50 MG tablet, Take 1 tablet by mouth twice daily, Disp: 60 tablet, Rfl: 0 .  norethindrone (AYGESTIN) 5 MG tablet, Take 5 mg by mouth 2 (two) times daily. , Disp: , Rfl:  .  potassium chloride SA (K-DUR) 10 MEQ tablet, Take 1 tablet (10 mEq total) by mouth daily., Disp: 30 tablet, Rfl: 3 .  pravastatin (PRAVACHOL) 20 MG tablet, Take 20 mg by mouth at bedtime. , Disp: , Rfl:  .  telmisartan (MICARDIS) 20 MG tablet, Take 1 tablet (20 mg total) by mouth daily., Disp: 30 tablet, Rfl: 3 .  torsemide (DEMADEX) 20 MG tablet, Take 1 tablet (20 mg)  by mouth once daily., Disp: 180 tablet, Rfl: 2  Exam: Current vital signs: BP (!) 143/73 (BP Location: Right Arm)   Pulse 92  Temp 98.5 F (36.9 C) (Oral)   Resp 16   Ht 5\' 2"  (1.575 m)   Wt 128.8 kg   SpO2 97%   BMI 51.94 kg/m  Vital signs in last 24 hours: Temp:  [98.5 F (36.9 C)] 98.5 F (36.9 C) (09/24 1618) Pulse Rate:  [82-93] 92 (09/24 2200) Resp:  [16-18] 16 (09/24 2200) BP: (114-169)/(68-90) 143/73 (09/24 2200) SpO2:  [94 %-99 %] 97 % (09/24 2200) Weight:  [128.8 kg] 128.8 kg (09/24 1617) General: Awake alert, slightly dyspneic but says she is in no distress. HEENT: Normocephalic atraumatic dry oral mucous membranes, extremely short and thick neck. Abdomen: Obese  non-tender Cardiovascular: Regular rate rhythm Lungs: Breathing well saturating normally on supplemental oxygen in the ER, appears to be mildly tachypneic and dyspneic but says in no distress as this has been going on for over a year Extremities: Mild pitting edema in the ankles bilaterally and nonpitting edema with oozing ulcers on both legs up to the knee. Neurological exam Awake alert oriented x3, speech is non-dysarthric No evidence of aphasia, able to follow all commands Cranial nerves: Pupils equal round reactive light, extraocular movements intact, visual fields are full, facial sensation is intact light touch, face is symmetric, auditory acuity is intact, palate midline, tongue midline. Motor exam: Antigravity 5/5 in both upper extremities.  Antigravity 4+/5 in both lower extremities without drift- severe swelling prevents a good exam of the lower extremity. Sensory exam: Intact light touch all over with no extinction Coordination: No dysmetria noted NIH stroke scale-0   Labs I have reviewed labs in epic and the results pertinent to this consultation are:  CBC    Component Value Date/Time   WBC 11.9 (H) 06/03/2019 1618   RBC 4.10 06/03/2019 1618   HGB 10.2 (L) 06/03/2019 1633   HGB 9.5 (L) 02/12/2019 1116   HCT 30.0 (L) 06/03/2019 1633   HCT 32.0 (L) 02/12/2019 1116   PLT 344 06/03/2019 1618   PLT 393 02/12/2019 1116   MCV 77.1 (L) 06/03/2019 1618   MCV 76 (L) 02/12/2019 1116   MCH 20.5 (L) 06/03/2019 1618   MCHC 26.6 (L) 06/03/2019 1618   RDW 19.1 (H) 06/03/2019 1618   RDW 18.2 (H) 02/12/2019 1116   LYMPHSABS 1.5 06/03/2019 1618   MONOABS 0.9 06/03/2019 1618   EOSABS 0.6 (H) 06/03/2019 1618   BASOSABS 0.1 06/03/2019 1618    CMP     Component Value Date/Time   NA 137 06/03/2019 1633   NA 136 02/12/2019 1116   K 3.9 06/03/2019 1633   CL 104 06/03/2019 1633   CO2 23 06/03/2019 1618   GLUCOSE 178 (H) 06/03/2019 1633   BUN 11 06/03/2019 1633   BUN 20 02/12/2019  1116   CREATININE 0.90 06/03/2019 1633   CALCIUM 9.0 06/03/2019 1618   PROT 7.2 06/03/2019 1618   PROT 7.2 02/12/2019 1116   ALBUMIN 2.9 (L) 06/03/2019 1618   ALBUMIN 3.7 (L) 02/12/2019 1116   AST 23 06/03/2019 1618   ALT 18 06/03/2019 1618   ALKPHOS 41 06/03/2019 1618   BILITOT 0.4 06/03/2019 1618   BILITOT 0.3 02/12/2019 1116   GFRNONAA >60 06/03/2019 1618   GFRAA >60 06/03/2019 1618    Imaging I have reviewed the images obtained:  CT-scan of the brain-no acute change  Assessment: 63 year old with past medical history as above including that of hypertension, aortic valve disease status post aortic valve repair, ongoing dyspnea and tachypnea, on hormone replacement for uterine bleeding, presenting for  evaluation of an episode that lasted 15 to 20 minutes-left sided numbness and weakness along with slurred speech. Given her complicated cardiac history that includes aortic valve repair as well as the fact that she is on hormone replacement, this could have been an embolic TIA affecting the right cerebral hemisphere. I would recommend admission for further risk factor work-up and cardiology evaluation.  Impression: Evaluate for right hemispheric TIA  Recommendations: Admit to hospitalist Telemetry 2D echocardiogram MRI brain without contrast CT angiogram head and neck Continue aspirin and statin Consider cardiology consultation A1c Lipid panel PT OT speech therapy Stroke swallow screen  Stroke team will follow with you.   -- Amie Portland, MD [facility] Pager: [phone] If 7pm to 7am, please call on call as listed on [facility]ION.

## 2019-06-04 ENCOUNTER — Observation Stay (HOSPITAL_COMMUNITY): Payer: BC Managed Care – PPO

## 2019-06-04 ENCOUNTER — Telehealth: Payer: Self-pay | Admitting: Cardiology

## 2019-06-04 DIAGNOSIS — I361 Nonrheumatic tricuspid (valve) insufficiency: Secondary | ICD-10-CM | POA: Diagnosis not present

## 2019-06-04 DIAGNOSIS — I471 Supraventricular tachycardia: Secondary | ICD-10-CM | POA: Diagnosis not present

## 2019-06-04 DIAGNOSIS — Z20828 Contact with and (suspected) exposure to other viral communicable diseases: Secondary | ICD-10-CM | POA: Diagnosis present

## 2019-06-04 DIAGNOSIS — M6281 Muscle weakness (generalized): Secondary | ICD-10-CM | POA: Diagnosis present

## 2019-06-04 DIAGNOSIS — I130 Hypertensive heart and chronic kidney disease with heart failure and stage 1 through stage 4 chronic kidney disease, or unspecified chronic kidney disease: Secondary | ICD-10-CM | POA: Diagnosis present

## 2019-06-04 DIAGNOSIS — I350 Nonrheumatic aortic (valve) stenosis: Secondary | ICD-10-CM

## 2019-06-04 DIAGNOSIS — Z23 Encounter for immunization: Secondary | ICD-10-CM | POA: Diagnosis not present

## 2019-06-04 DIAGNOSIS — I10 Essential (primary) hypertension: Secondary | ICD-10-CM

## 2019-06-04 DIAGNOSIS — E039 Hypothyroidism, unspecified: Secondary | ICD-10-CM | POA: Diagnosis present

## 2019-06-04 DIAGNOSIS — Y718 Miscellaneous cardiovascular devices associated with adverse incidents, not elsewhere classified: Secondary | ICD-10-CM | POA: Diagnosis present

## 2019-06-04 DIAGNOSIS — L299 Pruritus, unspecified: Secondary | ICD-10-CM | POA: Diagnosis not present

## 2019-06-04 DIAGNOSIS — R739 Hyperglycemia, unspecified: Secondary | ICD-10-CM | POA: Diagnosis present

## 2019-06-04 DIAGNOSIS — N938 Other specified abnormal uterine and vaginal bleeding: Secondary | ICD-10-CM | POA: Diagnosis not present

## 2019-06-04 DIAGNOSIS — Z6841 Body Mass Index (BMI) 40.0 and over, adult: Secondary | ICD-10-CM | POA: Diagnosis not present

## 2019-06-04 DIAGNOSIS — I890 Lymphedema, not elsewhere classified: Secondary | ICD-10-CM | POA: Diagnosis present

## 2019-06-04 DIAGNOSIS — I340 Nonrheumatic mitral (valve) insufficiency: Secondary | ICD-10-CM | POA: Diagnosis not present

## 2019-06-04 DIAGNOSIS — I5032 Chronic diastolic (congestive) heart failure: Secondary | ICD-10-CM | POA: Diagnosis present

## 2019-06-04 DIAGNOSIS — R200 Anesthesia of skin: Secondary | ICD-10-CM | POA: Diagnosis present

## 2019-06-04 DIAGNOSIS — I712 Thoracic aortic aneurysm, without rupture: Secondary | ICD-10-CM | POA: Diagnosis present

## 2019-06-04 DIAGNOSIS — R29818 Other symptoms and signs involving the nervous system: Secondary | ICD-10-CM

## 2019-06-04 DIAGNOSIS — T82867A Thrombosis of cardiac prosthetic devices, implants and grafts, initial encounter: Secondary | ICD-10-CM | POA: Diagnosis present

## 2019-06-04 DIAGNOSIS — G459 Transient cerebral ischemic attack, unspecified: Secondary | ICD-10-CM | POA: Diagnosis present

## 2019-06-04 DIAGNOSIS — I2510 Atherosclerotic heart disease of native coronary artery without angina pectoris: Secondary | ICD-10-CM | POA: Diagnosis present

## 2019-06-04 DIAGNOSIS — Z954 Presence of other heart-valve replacement: Secondary | ICD-10-CM | POA: Diagnosis not present

## 2019-06-04 DIAGNOSIS — L03116 Cellulitis of left lower limb: Secondary | ICD-10-CM | POA: Diagnosis present

## 2019-06-04 DIAGNOSIS — G4733 Obstructive sleep apnea (adult) (pediatric): Secondary | ICD-10-CM | POA: Diagnosis present

## 2019-06-04 DIAGNOSIS — N183 Chronic kidney disease, stage 3 (moderate): Secondary | ICD-10-CM | POA: Diagnosis present

## 2019-06-04 DIAGNOSIS — E871 Hypo-osmolality and hyponatremia: Secondary | ICD-10-CM | POA: Diagnosis not present

## 2019-06-04 DIAGNOSIS — E876 Hypokalemia: Secondary | ICD-10-CM | POA: Diagnosis present

## 2019-06-04 DIAGNOSIS — R5381 Other malaise: Secondary | ICD-10-CM | POA: Diagnosis present

## 2019-06-04 DIAGNOSIS — I421 Obstructive hypertrophic cardiomyopathy: Secondary | ICD-10-CM | POA: Diagnosis present

## 2019-06-04 DIAGNOSIS — Q211 Atrial septal defect: Secondary | ICD-10-CM | POA: Diagnosis not present

## 2019-06-04 HISTORY — DX: Transient cerebral ischemic attack, unspecified: G45.9

## 2019-06-04 LAB — HEMOGLOBIN A1C
Hgb A1c MFr Bld: 7 % — ABNORMAL HIGH (ref 4.8–5.6)
Mean Plasma Glucose: 154.2 mg/dL

## 2019-06-04 LAB — LIPID PANEL
Cholesterol: 123 mg/dL (ref 0–200)
HDL: 30 mg/dL — ABNORMAL LOW (ref >40)
LDL Cholesterol: 71 mg/dL (ref 0–99)
Total CHOL/HDL Ratio: 4.1 RATIO
Triglycerides: 111 mg/dL (ref <150)
VLDL: 22 mg/dL (ref 0–40)

## 2019-06-04 LAB — CBG MONITORING, ED: Glucose-Capillary: 125 mg/dL — ABNORMAL HIGH (ref 70–99)

## 2019-06-04 LAB — ECHOCARDIOGRAM COMPLETE
Height: 62 in
Weight: 4500.91 oz

## 2019-06-04 LAB — HEPARIN LEVEL (UNFRACTIONATED): Heparin Unfractionated: 0.16 IU/mL — ABNORMAL LOW (ref 0.30–0.70)

## 2019-06-04 LAB — SARS CORONAVIRUS 2 (TAT 6-24 HRS): SARS Coronavirus 2: NEGATIVE

## 2019-06-04 IMAGING — CT CT ANGIO HEAD
2 of 7 series · 8 of 33 positions shown · IV contrast (APPLIED)
Comparison: Noncontrast head CT from yesterday

CLINICAL DATA: Slurred speech and left arm weakness.

EXAM:
CT ANGIOGRAPHY HEAD AND NECK
TECHNIQUE: Multidetector CT imaging of the head and neck was performed using
the standard protocol during bolus administration of intravenous
contrast. Multiplanar CT image reconstructions and MIPs were
obtained to evaluate the vascular anatomy. Carotid stenosis
measurements (when applicable) are obtained utilizing NASCET
criteria, using the distal internal carotid diameter as the
denominator.
CONTRAST:  100mL OMNIPAQUE IOHEXOL 350 MG/ML SOLN

[Series 6: cta neck/head · axial · 0.51mm/px · z∈[+887,+1005]mm · 2 of 177 slices shown]
[im 59/177  soft-tissue]
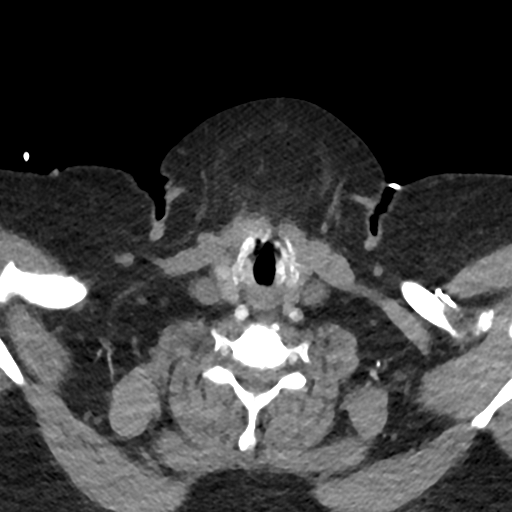
[im 118/177  soft-tissue]
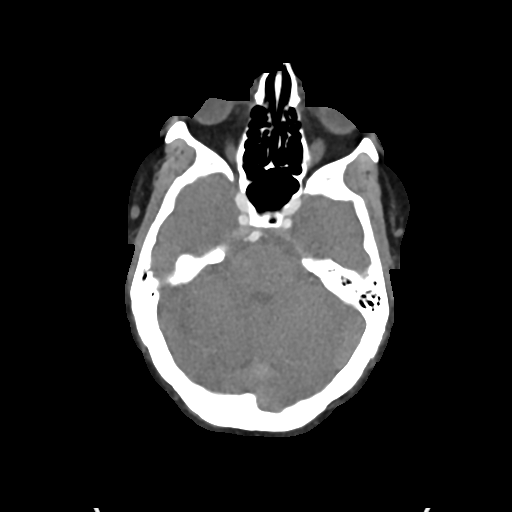

[Series 8: ax thins · axial · 0.50mm/px · z∈[+821,+1072]mm · 6 of 353 slices shown]
[im 51/353  soft-tissue]
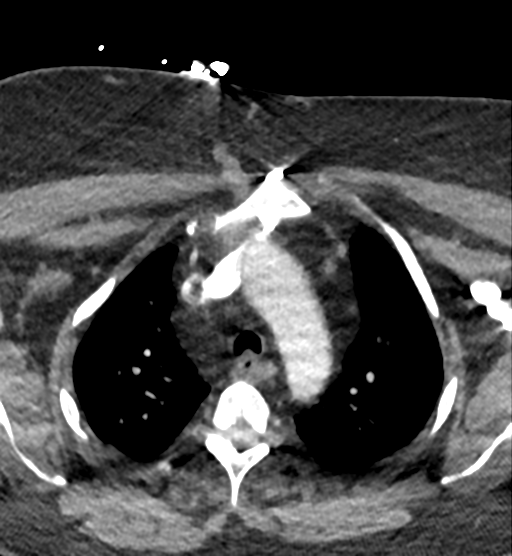
[im 101/353  bone]
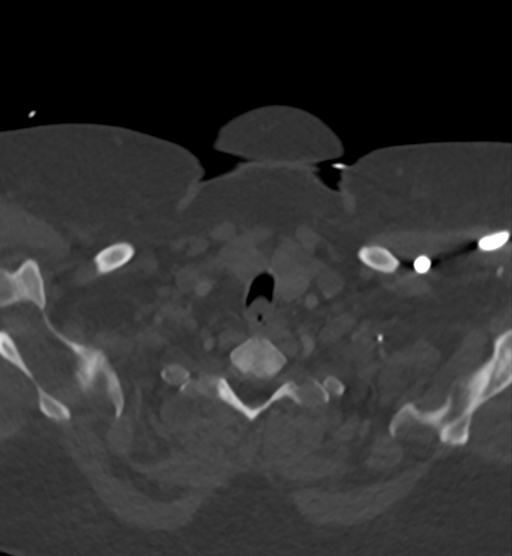
[im 151/353  soft-tissue]
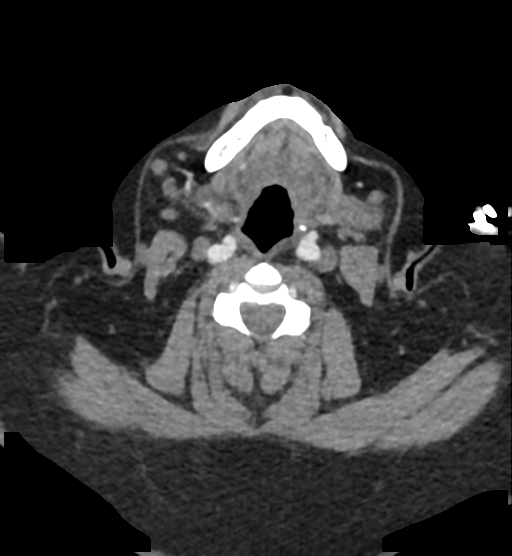
[im 202/353  bone]
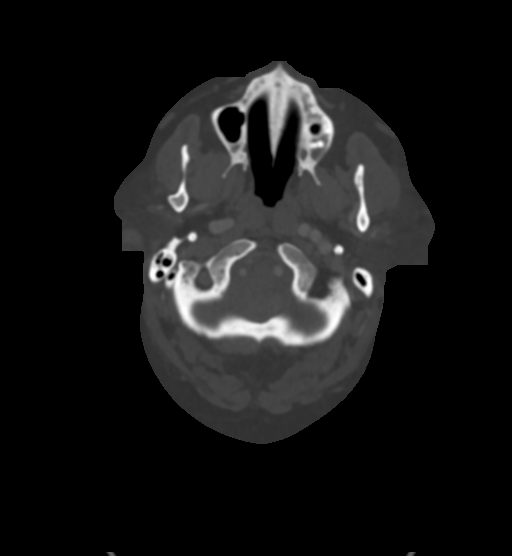
[im 252/353  soft-tissue]
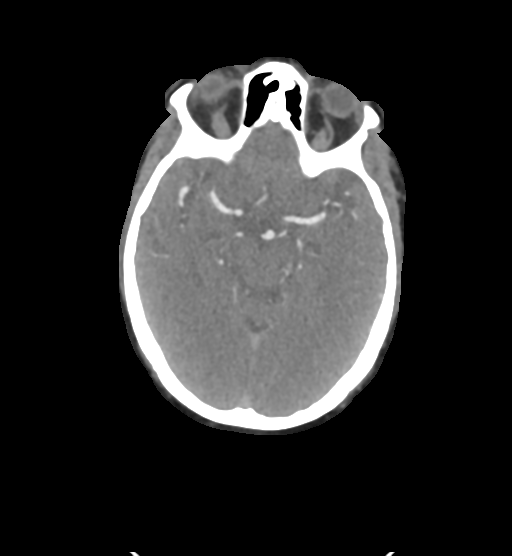
[im 302/353  bone]
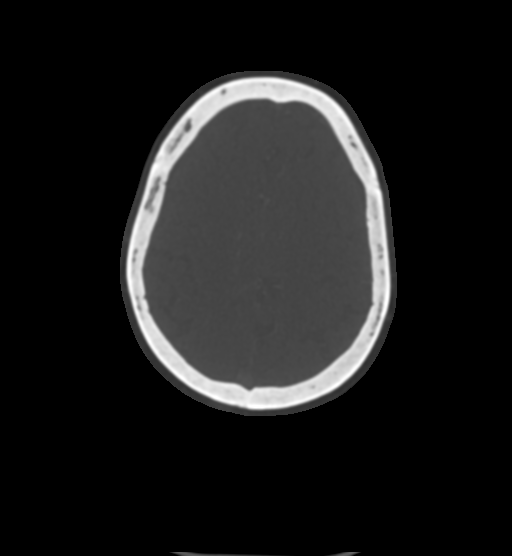

[8 of 33 positions shown; findings below may reference images not displayed]

FINDINGS: CTA NECK FINDINGS

Aortic arch: Negative

Right carotid system: No atheromatous changes. Vessels are diffusely
patent without ulceration. Mild tortuosity at the skull base.

Left carotid system: Vessels are smooth and widely patent. No noted
atheromatous changes. ICA tortuosity at the skull base

Vertebral arteries: No proximal subclavian stenosis. Fenestrated
right V4 segment. The vertebral and basilar arteries are smooth and
diffusely patent

Skeleton: No acute or aggressive finding

Other neck: Negative

Upper chest: Tracheobronchomalacia.

Review of the MIP images confirms the above findings

CTA HEAD FINDINGS

Anterior circulation: Vessels are smooth and widely patent. Ministry
shin at the left A1 2 junction. Negative for aneurysm.

Posterior circulation: Vertebral and basilar arteries. No branch
occlusion are smooth and diffusely patent or flow limiting stenosis

Venous sinuses: Limited opacification in the arterial phase without
detectable filling defect

Anatomic variants: As above

Review of the MIP images confirms the above findings
IMPRESSION: No emergent finding or flow limiting stenosis.

## 2019-06-04 IMAGING — MR MR HEAD W/O CM
12 of 13 series · 44 of 48 positions shown · non-contrast
Comparison: CT and CTA from yesterday and earlier today

CLINICAL DATA: TIA, initial exam

EXAM:
MRI HEAD WITHOUT CONTRAST
TECHNIQUE: Multiplanar, multiecho pulse sequences of the brain and surrounding
structures were obtained without intravenous contrast.

[Series 5: DWI · axial · 3.0mm · 0.88mm/px · z∈[-122,+24]mm · 8 of 100 slices shown (1 of 4)]
[im 1/100]
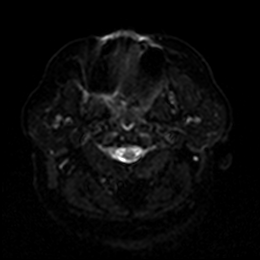
[im 15/100]
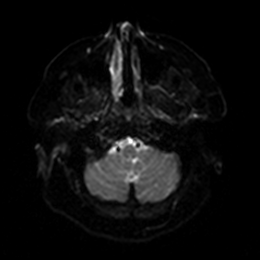
[im 29/100]
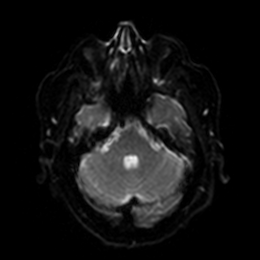
[im 43/100]
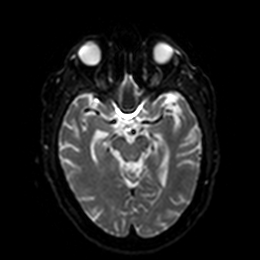
[im 57/100]
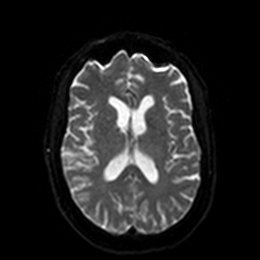
[im 71/100]
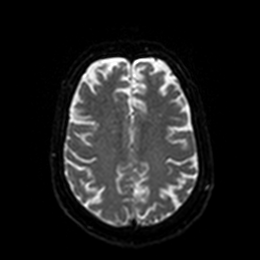
[im 85/100]
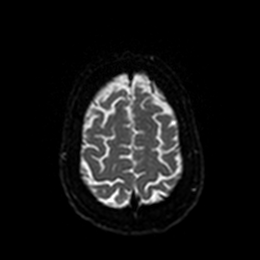
[im 100/100]
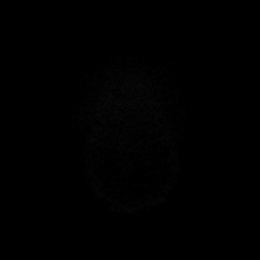

[Series 6: DWI · axial · 3.0mm · 0.88mm/px · z∈[-122,+24]mm · 4 of 50 slices shown (2 of 4)]
[im 1/50]
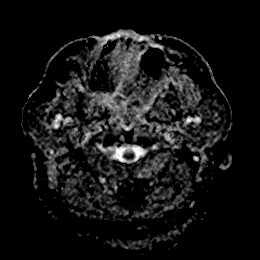
[im 17/50]
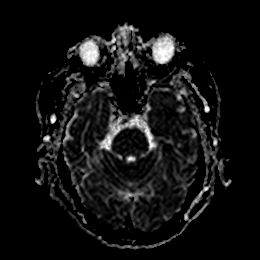
[im 33/50]
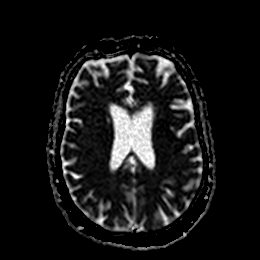
[im 50/50]
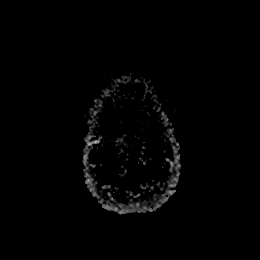

[Series 11: DWI · coronal · 4.0mm · 0.88mm/px · 5 of 70 slices shown (3 of 4)]
[im 1/70]
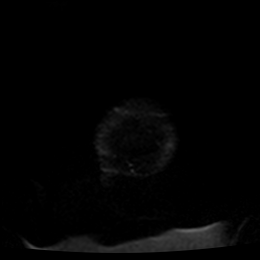
[im 18/70]
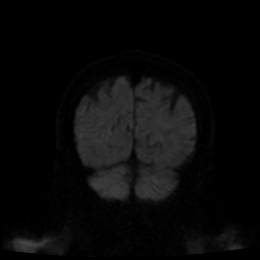
[im 35/70]
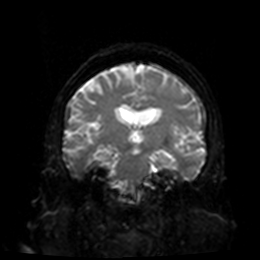
[im 52/70]
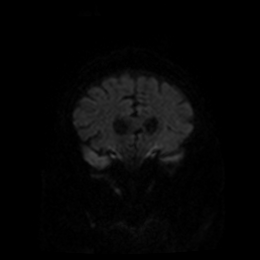
[im 70/70]
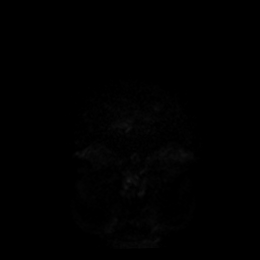

[Series 12: DWI · coronal · 4.0mm · 0.88mm/px · 3 of 35 slices shown (4 of 4)]
[im 1/35]
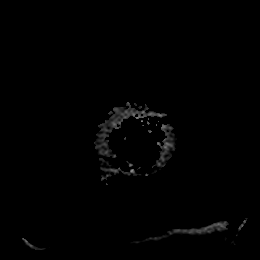
[im 18/35]
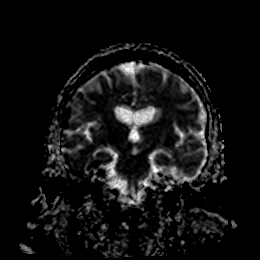
[im 35/35]
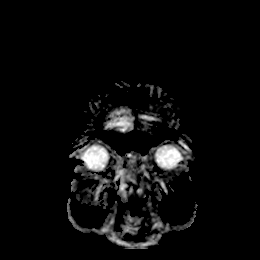

[Series 13: T1 · sagittal · 5.0mm · 0.75mm/px · 2 of 23 slices shown]
[im 1/23]
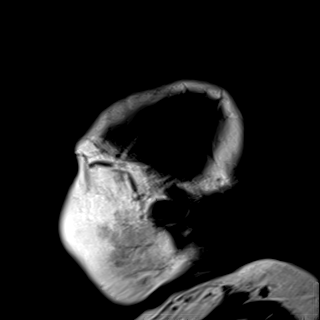
[im 23/23]
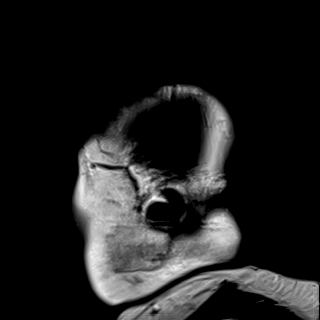

[Series 14: T2 · axial · 5.0mm · 0.72mm/px · z∈[-118,+38]mm · 2 of 27 slices shown (1 of 2)]
[im 1/27]
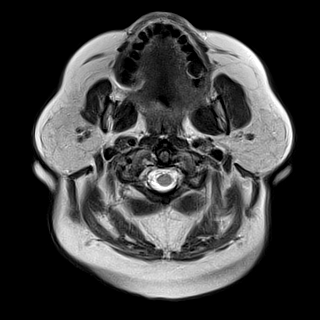
[im 27/27]
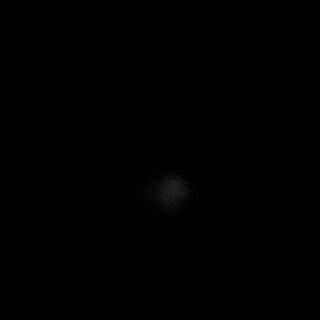

[Series 16: mag_images · axial · 3.0mm · 0.90mm/px · z∈[-112,+61]mm · 4 of 60 slices shown]
[im 1/60]
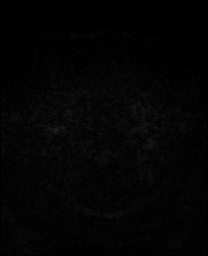
[im 20/60]
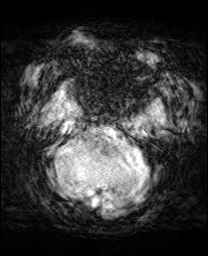
[im 40/60]
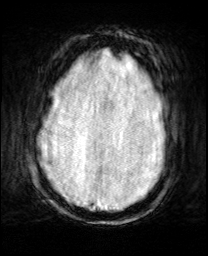
[im 60/60]
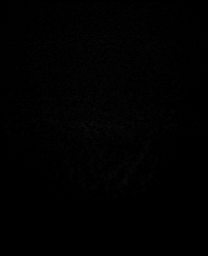

[Series 17: pha_images · axial · 3.0mm · 0.90mm/px · z∈[-112,+58]mm · 4 of 55 slices shown]
[im 1/55]
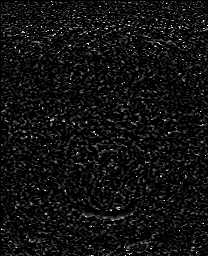
[im 19/55]
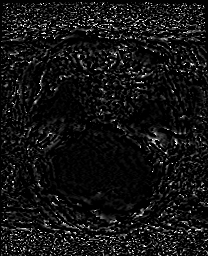
[im 37/55]
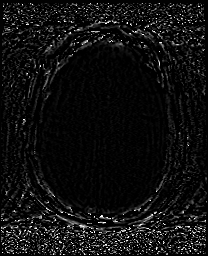
[im 55/55]
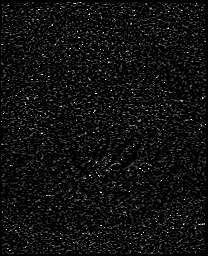

[Series 18: swi_images · axial · 3.0mm · 0.90mm/px · z∈[-112,+61]mm · 4 of 60 slices shown]
[im 1/60]
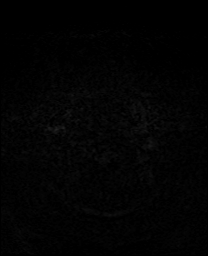
[im 20/60]
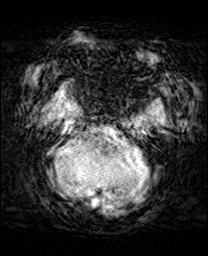
[im 40/60]
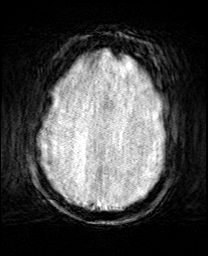
[im 60/60]
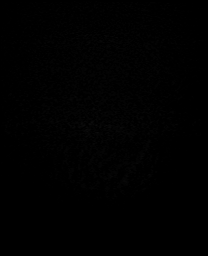

[Series 19: mip_images(sw) · axial · 24.0mm · 0.90mm/px · z∈[-102,+51]mm · 4 of 53 slices shown]
[im 1/53]
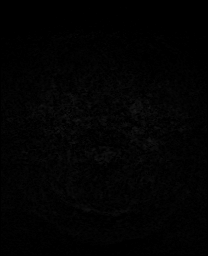
[im 18/53]
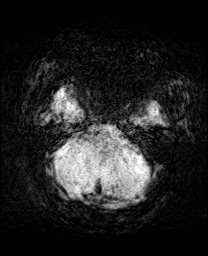
[im 35/53]
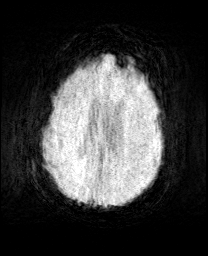
[im 53/53]
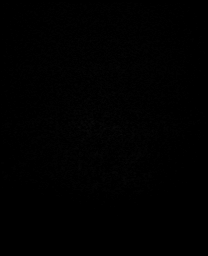

[Series 20: T2 · coronal · 5.0mm · 0.34mm/px · 2 of 29 slices shown (2 of 2)]
[im 1/29]
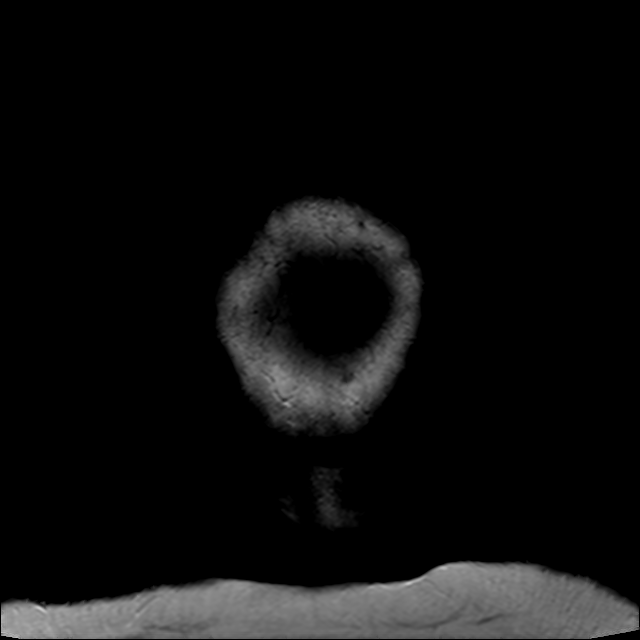
[im 29/29]
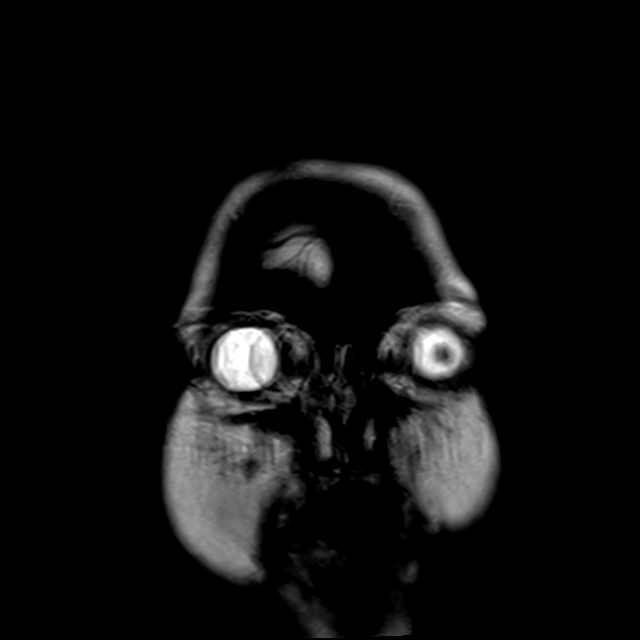

[Series 22: FLAIR · axial · 5.0mm · 0.90mm/px · z∈[-96,+45]mm · 2 of 25 slices shown]
[im 1/25]
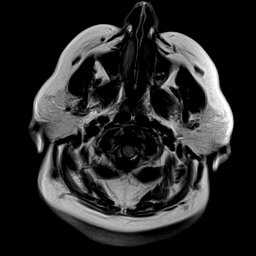
[im 25/25]
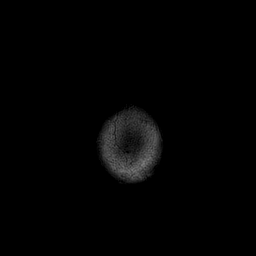

[44 of 48 positions shown; findings below may reference images not displayed]

FINDINGS: Brain: No acute infarction, hemorrhage, hydrocephalus, extra-axial
collection or mass lesion.

Vascular: Normal flow voids.

Skull and upper cervical spine: Normal marrow signal.

Sinuses/Orbits: Negative.

Other: Progressive and overall moderate motion artifact.
IMPRESSION: Negative brain MRI with moderate motion degradation.

## 2019-06-04 MED ORDER — FUROSEMIDE 10 MG/ML IJ SOLN
INTRAMUSCULAR | Status: AC
  Filled 2019-06-04: qty 4

## 2019-06-04 MED ORDER — ASPIRIN EC 81 MG PO TBEC
81.0000 mg | DELAYED_RELEASE_TABLET | Freq: Every day | ORAL | Status: DC
  Administered 2019-06-04 – 2019-06-09 (×6): 81 mg via ORAL
  Filled 2019-06-04 (×6): qty 1

## 2019-06-04 MED ORDER — HEPARIN (PORCINE) 25000 UT/250ML-% IV SOLN
1650.0000 [IU]/h | INTRAVENOUS | Status: DC
  Administered 2019-06-04: 1600 [IU]/h via INTRAVENOUS
  Administered 2019-06-05 (×2): 1700 [IU]/h via INTRAVENOUS
  Administered 2019-06-06 – 2019-06-07 (×2): 1650 [IU]/h via INTRAVENOUS
  Filled 2019-06-04 (×5): qty 250

## 2019-06-04 MED ORDER — SODIUM CHLORIDE 0.9% FLUSH: 3.0000 mL | INTRAVENOUS | Status: DC | PRN

## 2019-06-04 MED ORDER — NORETHINDRONE ACETATE 5 MG PO TABS
5.0000 mg | ORAL_TABLET | Freq: Two times a day (BID) | ORAL | Status: DC
  Administered 2019-06-04 – 2019-06-09 (×11): 5 mg via ORAL
  Filled 2019-06-04 (×14): qty 1

## 2019-06-04 MED ORDER — IOHEXOL 350 MG/ML SOLN
100.0000 mL | Freq: Once | INTRAVENOUS | Status: AC | PRN
  Administered 2019-06-04: 100 mL via INTRAVENOUS

## 2019-06-04 MED ORDER — PNEUMOCOCCAL VAC POLYVALENT 25 MCG/0.5ML IJ INJ
0.5000 mL | INJECTION | INTRAMUSCULAR | Status: AC
  Administered 2019-06-05: 0.5 mL via INTRAMUSCULAR
  Filled 2019-06-04: qty 0.5

## 2019-06-04 MED ORDER — FUROSEMIDE 10 MG/ML IJ SOLN
20.0000 mg | Freq: Once | INTRAMUSCULAR | Status: AC
  Administered 2019-06-04: 20 mg via INTRAVENOUS

## 2019-06-04 MED ORDER — INFLUENZA VAC SPLIT QUAD 0.5 ML IM SUSY
0.5000 mL | PREFILLED_SYRINGE | INTRAMUSCULAR | Status: AC
  Administered 2019-06-05: 0.5 mL via INTRAMUSCULAR
  Filled 2019-06-04: qty 0.5

## 2019-06-04 MED ORDER — CEPHALEXIN 500 MG PO CAPS
500.0000 mg | ORAL_CAPSULE | Freq: Four times a day (QID) | ORAL | Status: DC
  Administered 2019-06-04 – 2019-06-06 (×8): 500 mg via ORAL
  Filled 2019-06-04 (×8): qty 1

## 2019-06-04 MED ORDER — STROKE: EARLY STAGES OF RECOVERY BOOK
Freq: Once | Status: AC
  Administered 2019-06-04: 01:00:00
  Filled 2019-06-04: qty 1

## 2019-06-04 MED ORDER — ACETAMINOPHEN 160 MG/5ML PO SOLN: 650.0000 mg | ORAL | Status: DC | PRN

## 2019-06-04 MED ORDER — CLOPIDOGREL BISULFATE 75 MG PO TABS
75.0000 mg | ORAL_TABLET | Freq: Every day | ORAL | Status: DC
  Administered 2019-06-04: 75 mg via ORAL
  Filled 2019-06-04: qty 1

## 2019-06-04 MED ORDER — PRAVASTATIN SODIUM 40 MG PO TABS
40.0000 mg | ORAL_TABLET | Freq: Every day | ORAL | Status: DC
  Administered 2019-06-04 – 2019-06-09 (×6): 40 mg via ORAL
  Filled 2019-06-04 (×7): qty 1

## 2019-06-04 MED ORDER — ACETAMINOPHEN 325 MG PO TABS
650.0000 mg | ORAL_TABLET | ORAL | Status: DC | PRN
  Administered 2019-06-04: 650 mg via ORAL
  Filled 2019-06-04: qty 2

## 2019-06-04 MED ORDER — SODIUM CHLORIDE 0.9 % IV SOLN: 250.0000 mL | INTRAVENOUS | Status: DC | PRN

## 2019-06-04 MED ORDER — ACETAMINOPHEN 650 MG RE SUPP: 650.0000 mg | RECTAL | Status: DC | PRN

## 2019-06-04 MED ORDER — TORSEMIDE 20 MG PO TABS
40.0000 mg | ORAL_TABLET | Freq: Every day | ORAL | Status: DC
  Administered 2019-06-04 – 2019-06-07 (×4): 40 mg via ORAL
  Filled 2019-06-04 (×5): qty 2

## 2019-06-04 MED ORDER — SENNOSIDES-DOCUSATE SODIUM 8.6-50 MG PO TABS: 1.0000 | ORAL_TABLET | Freq: Every evening | ORAL | Status: DC | PRN

## 2019-06-04 MED ORDER — TORSEMIDE 20 MG PO TABS: 20.0000 mg | ORAL_TABLET | Freq: Every day | ORAL | Status: DC

## 2019-06-04 MED ORDER — SODIUM CHLORIDE 0.9% FLUSH
3.0000 mL | Freq: Two times a day (BID) | INTRAVENOUS | Status: DC
  Administered 2019-06-04 – 2019-06-09 (×11): 3 mL via INTRAVENOUS

## 2019-06-04 MED ORDER — PRAVASTATIN SODIUM 10 MG PO TABS: 20.0000 mg | ORAL_TABLET | Freq: Every day | ORAL | Status: DC

## 2019-06-04 MED ORDER — POTASSIUM CHLORIDE CRYS ER 10 MEQ PO TBCR
10.0000 meq | EXTENDED_RELEASE_TABLET | Freq: Every day | ORAL | Status: DC
  Administered 2019-06-04 – 2019-06-05 (×2): 10 meq via ORAL
  Filled 2019-06-04 (×2): qty 1

## 2019-06-04 NOTE — Progress Notes (Addendum)
Progress Note    [name]  [id number] DOB: 1956-01-24  DOA: 06/03/2019 PCP: [name], MD    Brief Narrative:    Medical records reviewed and are as summarized below:  [name] is an 63 y.o. 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   Assessment/Plan:   Principal Problem:   Transient neurologic deficit Active Problems:   Aortic stenosis   H/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   Dysfunctional uterine bleeding   Anemia   Chronic diastolic CHF (congestive heart failure) (HCC)   CAD (coronary artery disease)   Essential hypertension   TIA - Presents after experiencing speech difficultly and LUE numbness and weakness that occurred at ~14:30 resolved after several minutes  - No acute findings on head CT  - Neurology is consulting and much appreciated  - MRI negative -echo pending -CTA head/neck: No emergent finding or flow limiting stenosis LDL: 71- statin -HgbA1c: 7 -ASA/plavix -will also get cards consult in case TEE or more urgent addressing of valve is needed PT/OT eval  Chronic diastolic CHF  - Appears hypervolemic    - s/p IV lasix -resume Demadex at higher dose and monitor I/Os, symptoms and weights   Aortic stenosis  - She is s/p bioprosthetic valve replacement, being considered for revision, echo is ordered as above  -recent CTA: There are two hypo-attenuated areas under the leaflets that possibly represent leaflets thrombosis. In the presence of symptoms consider TEE for further evaluation -see above regarding cardiology  consultation  Hypertension  - BP at goal, hold antihypertensives initially while evaluating of possible acute ischemic CVA     Microcytic anemia; dysfunctional uterine bleeding  - Hgb appears stable at 8.4 on admission, attributed to DUB, managed with hormones currently and with pt being considered for [facility] but respiratory status needs to be addressed prior -resume her PROGESTERONE only medications for the DUB -? If PRBCs and an increased Hgb could also help with dyspnea  CKD II  - SCr is 0.96 in ED, lower than recent priors  - Renally-dose medications, monitor     Lymphedema; cellulitis  - Patient has lymphedema involving bilateral LE's, reports recent increase in erythema and tenderness involving lower left leg which is noted to be hot and erythematous with a few areas of maceration serous weeping  - Start Keflex, continue supportive care  -follows at the [facility] lymphed[facility] clinic  Morbid obesity Body mass index is 51.45 kg/m.   Not yet ready for discharge home.  Needs cardiology evaluation as well as final recommendations from Stroke MD-- higher risk of further clots/CVAs with ? Leaflet thrombosis?  Family Communication/Anticipated D/C date and plan/Code Status   DVT prophylaxis: scd Code Status: Full Code.  Family Communication:  Disposition Plan: cardiology consult as well as neurology CVA recommendations   Medical Consultants:    Neurology  Cardiology  Subjective:   Shortness of breath is at her baseline-- she is having more vaginal bleeding that normally  Objective:    Vitals:   06/04/19 0230 06/04/19 0410 06/04/19 0532 06/04/19 0909  BP: (!) 132/53 103/66 (!) 121/57 (!) 145/54  Pulse: 80 84 85 88  Resp: 16 16 20 20   Temp:  99.5 F (37.5 C) 98.2 F (36.8 C)  TempSrc:   Oral Oral  SpO2: 96% 100% 99% 94%  Weight:   127.6 kg   Height:   5\' 2"  (1.575 m)     Intake/Output Summary (Last 24 hours) at 06/04/2019 0958 Last data filed at 06/04/2019  [id number] Gross per 24 hour  Intake --  Output 1000 ml  Net -1000 ml   Filed Weights   06/03/19 1617 06/04/19 0532  Weight: 128.8 kg 127.6 kg    Exam: Laying bed, obese female Diminished breath sounds but visibly appears short of breath +Bs, soft, NT +LE edema  Data Reviewed:   I have personally reviewed following labs and imaging studies:  Labs: Labs show the following:   Basic Metabolic Panel: Recent Labs  Lab 06/03/19 1618 06/03/19 1633  NA 135 137  K 3.8 3.9  CL 103 104  CO2 23  --   GLUCOSE 184* 178*  BUN 11 11  CREATININE 0.96 0.90  CALCIUM 9.0  --    GFR Estimated Creatinine Clearance: 80.8 mL/min (by C-G formula based on SCr of 0.9 mg/dL). Liver Function Tests: Recent Labs  Lab 06/03/19 1618  AST 23  ALT 18  ALKPHOS 41  BILITOT 0.4  PROT 7.2  ALBUMIN 2.9*   No results for input(s): LIPASE, AMYLASE in the last 168 hours. No results for input(s): AMMONIA in the last 168 hours. Coagulation profile Recent Labs  Lab 06/03/19 1618  INR 1.1    CBC: Recent Labs  Lab 06/03/19 1618 06/03/19 1633  WBC 11.9*  --   NEUTROABS 8.7*  --   HGB 8.4* 10.2*  HCT 31.6* 30.0*  MCV 77.1*  --   PLT 344  --    Cardiac Enzymes: No results for input(s): CKTOTAL, CKMB, CKMBINDEX, TROPONINI in the last 168 hours. BNP (last 3 results) Recent Labs    06/26/18 1352 08/14/18 1058 02/12/19 1116  PROBNP 698* 876* 640*   CBG: Recent Labs  Lab 06/04/19 0105  GLUCAP 125*   D-Dimer: No results for input(s): DDIMER in the last 72 hours. Hgb A1c: Recent Labs    06/04/19 0507  HGBA1C 7.0*   Lipid Profile: Recent Labs    06/04/19 0507  CHOL 123  HDL 30*  LDLCALC 71  TRIG 111  CHOLHDL 4.1   Thyroid function studies: No results for input(s): TSH, T4TOTAL, T3FREE, THYROIDAB in the last 72 hours.  Invalid input(s): FREET3 Anemia work up: No results for input(s): VITAMINB12, FOLATE, FERRITIN, TIBC, IRON, RETICCTPCT in the last 72 hours. Sepsis  Labs: Recent Labs  Lab 06/03/19 1618  WBC 11.9*    Microbiology Recent Results (from the past 240 hour(s))  SARS CORONAVIRUS 2 (TAT 6-24 HRS) Nasopharyngeal Nasopharyngeal Swab     Status: None   Collection Time: 06/03/19 10:31 PM   Specimen: Nasopharyngeal Swab  Result Value Ref Range Status   SARS Coronavirus 2 NEGATIVE NEGATIVE Final    Comment: (NOTE) SARS-CoV-2 target nucleic acids are NOT DETECTED. The SARS-CoV-2 RNA is generally detectable in upper and lower respiratory specimens during the acute phase of infection. Negative results do not preclude SARS-CoV-2 infection, do not rule out co-infections with other pathogens, and should not be used as the sole basis for treatment or other patient management decisions. Negative results must be combined with clinical observations, patient history, and epidemiological information. The expected result is Negative. Fact Sheet for Patients: SugarRoll.be Fact Sheet for Healthcare Providers: https://www.woods-mathews.com/ This test is not yet approved or  cleared by the [place] and  has been authorized for detection and/or diagnosis of SARS-CoV-2 by FDA under an Emergency Use Authorization (EUA). This EUA will remain  in effect (meaning this test can be used) for the duration of the COVID-19 declaration under Section 56 4(b)(1) of the Act, 21 U.S.C. section 360bbb-3(b)(1), unless the authorization is terminated or revoked sooner. Performed at [facility], [place] [address], [place], [place] [address]     Procedures and diagnostic studies:  Ct Angio Head W Or Wo Contrast  Result Date: 06/04/2019 CLINICAL DATA:  Slurred speech and left arm weakness. EXAM: CT ANGIOGRAPHY HEAD AND NECK TECHNIQUE: Multidetector CT imaging of the head and neck was performed using the standard protocol during bolus administration of intravenous contrast. Multiplanar CT image reconstructions and MIPs  were obtained to evaluate the vascular anatomy. Carotid stenosis measurements (when applicable) are obtained utilizing NASCET criteria, using the distal internal carotid diameter as the denominator. CONTRAST:  175mL OMNIPAQUE IOHEXOL 350 MG/ML SOLN COMPARISON:  Noncontrast head CT from yesterday FINDINGS: CTA NECK FINDINGS Aortic arch: Negative Right carotid system: No atheromatous changes. Vessels are diffusely patent without ulceration. Mild tortuosity at the skull base. Left carotid system: Vessels are smooth and widely patent. No noted atheromatous changes. ICA tortuosity at the skull base Vertebral arteries: No proximal subclavian stenosis. Fenestrated right V4 segment. The vertebral and basilar arteries are smooth and diffusely patent Skeleton: No acute or aggressive finding Other neck: Negative Upper chest: Tracheobronchomalacia. Review of the MIP images confirms the above findings CTA HEAD FINDINGS Anterior circulation: Vessels are smooth and widely patent. Ministry shin at the left A1 2 junction. Negative for aneurysm. Posterior circulation: Vertebral and basilar arteries. No branch occlusion are smooth and diffusely patent or flow limiting stenosis Venous sinuses: Limited opacification in the arterial phase without detectable filling defect Anatomic variants: As above Review of the MIP images confirms the above findings IMPRESSION: No emergent finding or flow limiting stenosis. Electronically Signed   By: Monte Fantasia M.D.   On: 06/04/2019 04:22   Ct Head Wo Contrast  Result Date: 06/03/2019 CLINICAL DATA:  Slurred speech, left arm weakness and numbness. EXAM: CT HEAD WITHOUT CONTRAST TECHNIQUE: Contiguous axial images were obtained from the base of the skull through the vertex without intravenous contrast. COMPARISON:  None. FINDINGS: Brain: No acute intracranial abnormality. Specifically, no hemorrhage, hydrocephalus, mass lesion, acute infarction, or significant intracranial injury. Vascular: No  hyperdense vessel or unexpected calcification. Skull: No acute calvarial abnormality. Sinuses/Orbits: Visualized paranasal sinuses and mastoids clear. Orbital soft tissues unremarkable. Other: None IMPRESSION: No intracranial abnormality. Electronically Signed   By: Rolm Baptise M.D.   On: 06/03/2019 18:41   Ct Angio Neck W Or Wo Contrast  Result Date: 06/04/2019 CLINICAL DATA:  Slurred speech and left arm weakness. EXAM: CT ANGIOGRAPHY HEAD AND NECK TECHNIQUE: Multidetector CT imaging of the head and neck was performed using the standard protocol during bolus administration of intravenous contrast. Multiplanar CT image reconstructions and MIPs were obtained to evaluate the vascular anatomy. Carotid stenosis measurements (when applicable) are obtained utilizing NASCET criteria, using the distal internal carotid diameter as the denominator. CONTRAST:  148mL OMNIPAQUE IOHEXOL 350 MG/ML SOLN COMPARISON:  Noncontrast head CT from yesterday FINDINGS: CTA NECK FINDINGS Aortic arch: Negative Right carotid system: No atheromatous changes. Vessels are diffusely patent without ulceration. Mild tortuosity at the skull base. Left carotid system: Vessels are smooth and widely patent. No noted atheromatous changes. ICA tortuosity at the skull  base Vertebral arteries: No proximal subclavian stenosis. Fenestrated right V4 segment. The vertebral and basilar arteries are smooth and diffusely patent Skeleton: No acute or aggressive finding Other neck: Negative Upper chest: Tracheobronchomalacia. Review of the MIP images confirms the above findings CTA HEAD FINDINGS Anterior circulation: Vessels are smooth and widely patent. Ministry shin at the left A1 2 junction. Negative for aneurysm. Posterior circulation: Vertebral and basilar arteries. No branch occlusion are smooth and diffusely patent or flow limiting stenosis Venous sinuses: Limited opacification in the arterial phase without detectable filling defect Anatomic variants: As  above Review of the MIP images confirms the above findings IMPRESSION: No emergent finding or flow limiting stenosis. Electronically Signed   By: Monte Fantasia M.D.   On: 06/04/2019 04:22   Mr Brain Wo Contrast  Result Date: 06/04/2019 CLINICAL DATA:  TIA, initial exam EXAM: MRI HEAD WITHOUT CONTRAST TECHNIQUE: Multiplanar, multiecho pulse sequences of the brain and surrounding structures were obtained without intravenous contrast. COMPARISON:  CT and CTA from yesterday and earlier today FINDINGS: Brain: No acute infarction, hemorrhage, hydrocephalus, extra-axial collection or mass lesion. Vascular: Normal flow voids. Skull and upper cervical spine: Normal marrow signal. Sinuses/Orbits: Negative. Other: Progressive and overall moderate motion artifact. IMPRESSION: Negative brain MRI with moderate motion degradation. Electronically Signed   By: Monte Fantasia M.D.   On: 06/04/2019 07:42    Medications:    aspirin EC  81 mg Oral Daily   cephALEXin  500 mg Oral Q6H   clopidogrel  75 mg Oral Daily   [START ON 06/05/2019] influenza vac split quadrivalent PF  0.5 mL Intramuscular Tomorrow-1000   [START ON 06/05/2019] pneumococcal 23 valent vaccine  0.5 mL Intramuscular Tomorrow-1000   potassium chloride  10 mEq Oral Daily   pravastatin  40 mg Oral q1800   sodium chloride flush  3 mL Intravenous Once   sodium chloride flush  3 mL Intravenous Q12H   torsemide  20 mg Oral Daily   Continuous Infusions:  sodium chloride       LOS: 0 days   Geradine Girt  [facility]   How to contact the [facility] Attending or Consulting provider [place] or covering provider during after hours [place], for this patient?  1. Check the care team in [facility] and look for a) attending/consulting [facility] provider listed and b) the [facility] team listed 2. Log into [email or web address] and use [place]'s universal password to access. If you do not have the password, please contact the hospital operator. 3. Locate the [facility]  provider you are looking for under [facility] and page to a number that you can be directly reached. 4. If you still have difficulty reaching the provider, please page the [facility] (Director on Call) for the Hospitalists listed on amion for assistance.  06/04/2019, 9:58 AM

## 2019-06-04 NOTE — Progress Notes (Signed)
  Echocardiogram 2D Echocardiogram has been performed.  [name] 06/04/2019, 8:45 AM

## 2019-06-04 NOTE — Telephone Encounter (Signed)
Patient in [facility] and may go ahead have valve replacment surgery while in there. Just FYI

## 2019-06-04 NOTE — Progress Notes (Signed)
ANTICOAGULATION CONSULT NOTE  Pharmacy Consult:  Heparin Indication:  Possible AV leaflet thrombosis  No Known Allergies  Patient Measurements: Height: 5\' 2"  (157.5 cm) Weight: 281 lb 4.9 oz (127.6 kg) IBW/kg (Calculated) : 50.1 Heparin Dosing Weight: 82 kg  Vital Signs: Temp: 98.6 F (37 C) (09/25 1949) Temp Source: Oral (09/25 1949) BP: 142/65 (09/25 1949) Pulse Rate: 99 (09/25 1949)  Labs: Recent Labs    06/03/19 1618 06/03/19 1633 06/04/19 1933  HGB 8.4* 10.2*  --   HCT 31.6* 30.0*  --   PLT 344  --   --   APTT 31  --   --   LABPROT 13.7  --   --   INR 1.1  --   --   HEPARINUNFRC  --   --  0.16*  CREATININE 0.96 0.90  --     Estimated Creatinine Clearance: 80.8 mL/min (by C-G formula based on SCr of 0.9 mg/dL).   Assessment: 80 YOF presented with speech difficulty, numbness and weakness.  Pharmacy consulted to dose IV heparin for possible AV leaflet thrombosis.   Patient was previously sub-therapeutic on heparin 1650 units/hr (last admit in July 2020).  Heparin level is 0.16 however heparin has only been infusing for 2.5 hrs. Start time was delayed due to uterine bleeding however no one communicated this to pharmacy so the level was not rescheduled. TRH gave order to proceed with heparin per RN note. Will increase very conservatively as this level is not at steady-state; was previously subtherapeutic on a higher rate in July 2020. Spoke with RN who reports small amount of uterine bleeding.  Goal of Therapy:  Heparin level 0.3 - 0.5 units/ml given TIA Monitor platelets by anticoagulation protocol: Yes   Plan:  Increase heparin drip to 1700 units/hr, no bolus with TIA Check 6 hr heparin level Daily heparin level and CBC   Thank you for involving pharmacy in this patient's care.  Renold Genta, PharmD, BCPS Clinical Pharmacist Clinical phone for 06/04/2019 until 10p is [phone] 06/04/2019 8:13 PM  **Pharmacist phone directory can be found on [email or web address]  listed under [place]**

## 2019-06-04 NOTE — Evaluation (Signed)
Occupational Therapy Evaluation Patient Details Name: [name] MRN: [id number] DOB: October 09, 1955 Today's Date: 06/04/2019    History of Present Illness 63yo female who experienced L UE weakness and slurred speech, symptoms resolved 15-20 minutes after onset. CT and MRI negative for acute changes. No tpa given. PMH BLE lymphedema, HTN, CHF, aortic stenosis s/p valve repair, renal insufficiency, dysfunctional uterine bleeding   Clinical Impression   Pt PTA: living with spouse and was mostly independent occasional assist for LB due to large body habitus. Pt currently performing ADL functional mobility with supervisionA to modified independence with no physical assist needed with RW. Pt tending to lean on counter for grooming at sink. Pt at her functional baseline. Pt reported previous fall onto R arm with stiffness noted, but AROM to 110* with no pain. Pt advised if pain worsens or to tell her PCP of fall and address further if needed. Upon palpation, joint stable and minimal pain felt. Pt does not require continued OT skilled services. OT signing off.    Follow Up Recommendations  No OT follow up    Equipment Recommendations  None recommended by OT    Recommendations for Other Services       Precautions / Restrictions Precautions Precautions: None Restrictions Weight Bearing Restrictions: No      Mobility Bed Mobility Overal bed mobility: Modified Independent             General bed mobility comments: in chair upon arrival  Transfers Overall transfer level: Modified independent Equipment used: Rolling walker (2 wheeled)             General transfer comment: increased time    Balance Overall balance assessment: Modified Independent;No apparent balance deficits (not formally assessed)                                         ADL either performed or assessed with clinical judgement   ADL Overall ADL's : At baseline                                        General ADL Comments: Pt performing own ADL tasks at sink and on the commode. Pt preferred to dress with spouse, but pt donned sock by bending over.     Vision Baseline Vision/History: Wears glasses Wears Glasses: At all times Patient Visual Report: No change from baseline(did not have glasses with her) Vision Assessment?: Yes;No apparent visual deficits Eye Alignment: Within Functional Limits Ocular Range of Motion: Within Functional Limits Alignment/Gaze Preference: Within Defined Limits Tracking/Visual Pursuits: Able to track stimulus in all quads without difficulty     Perception     Praxis      Pertinent Vitals/Pain Pain Assessment: 0-10 Pain Score: 2  Pain Location: R shoulder Pain Descriptors / Indicators: Discomfort Pain Intervention(s): Monitored during session     Hand Dominance Right   Extremity/Trunk Assessment Upper Extremity Assessment Upper Extremity Assessment: Generalized weakness;RUE deficits/detail;LUE deficits/detail RUE Deficits / Details: R shoulder AROM 110* shoulder flex/abduction; elbow through digits, WFLs. RUE Coordination: decreased fine motor;decreased gross motor LUE Deficits / Details: WFLs AROM and MMT   Lower Extremity Assessment Lower Extremity Assessment: Overall WFL for tasks assessed   Cervical / Trunk Assessment Cervical / Trunk Assessment: Normal   Communication Communication Communication: No difficulties  Cognition Arousal/Alertness: Awake/alert Behavior During Therapy: WFL for tasks assessed/performed Overall Cognitive Status: Within Functional Limits for tasks assessed                                 General Comments: A&Ox4   General Comments       Exercises Exercises: Other exercises Other Exercises Other Exercises: BUE shoulder exercises through digits, AROM WFLs   Shoulder Instructions      Home Living Family/patient expects to be discharged to:: Private residence Living  Arrangements: Spouse/significant other;Children Available Help at Discharge: Family;Available 24 hours/day Type of Home: House Home Access: Stairs to enter [employer] of Steps: 1 Entrance Stairs-Rails: None Home Layout: Two level;Able to live on main level with bedroom/bathroom Alternate Level Stairs-Number of Steps: flight but does not need to navigate this Alternate Level Stairs-Rails: Right Bathroom Shower/Tub: Teacher, early years/pre: Standard Bathroom Accessibility: Yes   Home Equipment: Environmental consultant - 4 wheels   Additional Comments: uses rollator only occasionally      Prior Functioning/Environment Level of Independence: Independent with assistive device(s)        Comments: doing well, failry independent, driving        OT Problem List: Pain      OT Treatment/Interventions:      OT Goals(Current goals can be found in the care plan section) Acute Rehab OT Goals Patient Stated Goal: go home  OT Frequency:     Barriers to D/C:            Co-evaluation              AM-PAC OT "6 Clicks" Daily Activity     Outcome Measure Help from another person eating meals?: None Help from another person taking care of personal grooming?: None Help from another person toileting, which includes using toliet, bedpan, or urinal?: None Help from another person bathing (including washing, rinsing, drying)?: A Little Help from another person to put on and taking off regular upper body clothing?: None Help from another person to put on and taking off regular lower body clothing?: A Little 6 Click Score: 22   End of Session Equipment Utilized During Treatment: Rolling walker Nurse Communication: Mobility status  Activity Tolerance: Patient tolerated treatment well Patient left: in chair;with call bell/phone within reach;with family/visitor present  OT Visit Diagnosis: Unsteadiness on feet (R26.81);Pain Pain - Right/Left: Right Pain - part of body: Arm                 Time: [id number] OT Time Calculation (min): 26 min Charges:  OT General Charges $OT Visit: 1 Visit OT Evaluation $OT Eval Moderate Complexity: 1 Mod OT Treatments $Self Care/Home Management : 8-22 mins  Ebony Hail Harold Hedge) Marsa Aris OTR/L Acute Rehabilitation Services Pager: [phone] Office: [place] 06/04/2019, 11:18 AM

## 2019-06-04 NOTE — Consult Note (Addendum)
Cardiology Consultation:   Patient ID: [name] MRN: [id number]; DOB: Mar 01, 1956  Admit date: 06/03/2019 Date of Consult: 06/04/2019  Primary Care Provider: [name], MD Primary Cardiologist: Shirlee More, MD   Patient Profile:   [name] is a 63 y.o. female with a hx of AS (s/p AVR in 2014, currently undergoing work up redo), HTN, morbid obesity, OSA, lymphedema and LE ulcers who is being seen today for the evaluation of possible thrombus on valve at the request of Dr. Eliseo Squires.   Hx of bicuspid AV and AS  She is s/p SAVR (23 mm Hancock) in July 2014 (Dr Clementeen Graham [facility])  She also has a history of dynamic LVOT obstruction.  Admitted 03/2019 with Chest pain. EF >65%, severely increased LV wall thickness, G2DD, no RWMA, moderate AS with mean gradiant through the valve of 26 mmHg, and 65mm dilation of the ascending aorta. She underwent a [facility] 04/01/2019 which showed normal coronary arteries with mildly elevated filling pressures. She was diuresed.   Seen by Dr. Bettina Gavia 04/13/2019 who felt AVR dysfunction after reviewing hospital results. Dr. Loney Hering discussed with Dr. Burt Knack who recommended TAVR cardiac CTA protocol.   CT coronary [place] 05/20/2019 IMPRESSION: 1. 23 mm Hancock valve is seen in the aortic position with normal size of the annular ring (23 mm). There is no abnormal valve motion, no dehiscence/perivalvular leak. Unfortunately, secondary to patient's size (BMI 52) and beam harding artifact leaflet visualization is very limited and leaflet opening is not visualized. There are two hypo-attenuated areas under the leaflets that possibly represent leaflets thrombosis.  In the presence of symptoms consider TEE for further evaluation.  2. Ascending aortic aneurysm with maximum diameter 42 mm. Annual chest CTA or MRA is recommended.   History of Present Illness:   [name] presented yesterday with transient neurology deficit of speech difficulty and left upper  extremity numbness and weakness. Symptoms lasted for 15 minutes and resolved spontaneously. Noncontrast head CT is negative for acute intracranial abnormality. MRI of brain and CT angio of head/neck without acute findings. Cardiology is consulted for evaluation of possible leaflets thrombosis on CT coronary on 9/10. Pending echo this admission.   Patient reports on our provider who is covering Dr. Bettina Gavia (on vacation for first 2 weeks) has call with CT coronary result and discuss possible starting Coumadin and TAVR Work up after discussion with Dr. Bettina Gavia.  Heart Pathway Score:     Past Medical History:  Diagnosis Date   Aortic stenosis 03/23/2013   Overview:  02/18/13 TTE EF >55%. Critical AS with mean Ao valve gradient of 82 mm Hg. No AI. No MR, PR, mild TR. Estimated RVSP 30 mm Hg.   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   Morbid obesity ([place]) 02/20/2017   Sleep apnea     Past Surgical History:  Procedure Laterality Date   CHOLECYSTECTOMY  1983   RIGHT/LEFT HEART CATH AND CORONARY ANGIOGRAPHY N/A 04/01/2019   Procedure: RIGHT/LEFT HEART CATH AND CORONARY ANGIOGRAPHY;  Surgeon: Lorretta Harp, MD;  Location: [place] [facility];  Service: Cardiovascular;  Laterality: N/A;   TEE WITHOUT CARDIOVERSION N/A 07/01/2018   Procedure: TRANSESOPHAGEAL ECHOCARDIOGRAM (TEE);  Surgeon: Buford Dresser, MD;  Location: [place];  Service: Cardiovascular;  Laterality: N/A;   TISSUE AORTIC VALVE REPLACEMENT  Inpatient Medications: Scheduled Meds:  aspirin EC  81 mg Oral Daily   cephALEXin  500 mg Oral Q6H   clopidogrel  75 mg Oral Daily   [START ON 06/05/2019] influenza vac split quadrivalent PF  0.5 mL Intramuscular Tomorrow-1000   norethindrone  5 mg Oral BID   [START ON 06/05/2019] pneumococcal 23 valent  vaccine  0.5 mL Intramuscular Tomorrow-1000   potassium chloride  10 mEq Oral Daily   pravastatin  40 mg Oral q1800   sodium chloride flush  3 mL Intravenous Once   sodium chloride flush  3 mL Intravenous Q12H   torsemide  40 mg Oral Daily   Continuous Infusions:  sodium chloride     PRN Meds: sodium chloride, acetaminophen **OR** acetaminophen (TYLENOL) oral liquid 160 mg/5 mL **OR** acetaminophen, senna-docusate, sodium chloride flush  Allergies:   No Known Allergies  Social History:   Social History   Socioeconomic History   Marital status: Married    Spouse name: Not on file   Number of children: Not on file   Years of education: Not on file   Highest education level: Not on file  Occupational History   Not on file  Social Needs   Financial resource strain: Not on file   Food insecurity    Worry: Not on file    Inability: Not on file   Transportation needs    Medical: Not on file    Non-medical: Not on file  Tobacco Use   Smoking status: Never Smoker   Smokeless tobacco: Never Used  Substance and Sexual Activity   Alcohol use: No    Frequency: Never   Drug use: No   Sexual activity: Not Currently  Lifestyle   Physical activity    Days per week: Not on file    Minutes per session: Not on file   Stress: Not on file  Relationships   Social connections    Talks on phone: Not on file    Gets together: Not on file    Attends religious service: Not on file    Active member of club or organization: Not on file    Attends meetings of clubs or organizations: Not on file    Relationship status: Not on file   Intimate partner violence    Fear of current or ex partner: Not on file    Emotionally abused: Not on file    Physically abused: Not on file    Forced sexual activity: Not on file  Other Topics Concern   Not on file  Social History Narrative   Not on file    Family History:   Family History  Problem Relation Age of Onset    Parkinson's disease Father    Heart attack Mother      ROS:  Please see the history of present illness.  All other ROS reviewed and negative.     Physical Exam/Data:   Vitals:   06/04/19 0410 06/04/19 0532 06/04/19 0909 06/04/19 1151  BP: 103/66 (!) 121/57 (!) 145/54 (!) 144/63  Pulse: 84 85 88 90  Resp: 16 20 20 20   Temp:  99.5 F (37.5 C) 98.2 F (36.8 C) 98.4 F (36.9 C)  TempSrc:  Oral Oral Oral  SpO2: 100% 99% 94% 97%  Weight:  127.6 kg    Height:  5\' 2"  (1.575 m)      Intake/Output Summary (Last 24 hours) at 06/04/2019 1223 Last data filed at 06/04/2019 0336 Gross per 24 hour  Intake --  Output 1000 ml  Net -1000 ml   Last 3 Weights 06/04/2019 06/03/2019 04/29/2019  Weight (lbs) 281 lb 4.9 oz 284 lb 287 lb  Weight (kg) 127.6 kg 128.822 kg 130.182 kg     Body mass index is 51.45 kg/m.  General: Obese female in no acute distress sitting on recliner HEENT: normal Lymph: no adenopathy Neck: no JVD Endocrine:  No thryomegaly Vascular: No carotid bruits; FA pulses 2+ bilaterally without bruits  Cardiac:  normal S1, S2; RRR; 3/6 systolic murmur  Lungs:  clear to auscultation bilaterally, no wheezing, rhonchi or rales  Abd: soft, nontender, no hepatomegaly  Ext: 2+ BL LE  Edema with erythema and open wound on LLE Musculoskeletal:  No deformities, BUE and BLE strength normal and equal Skin: warm and dry  Neuro:  CNs 2-12 intact, no focal abnormalities noted Psych:  Normal affect   EKG:  The EKG was personally reviewed and demonstrates:  SR at rate of 92 bpm, TWI laterally Telemetry:  Telemetry was personally reviewed and demonstrates:  NSR  Relevant CV Studies:   RIGHT/LEFT HEART CATH AND CORONARY ANGIOGRAPHY 04/01/2019 IMPRESSION: [name] has normal coronary arteries and mildly elevated filling pressures.  I do not think her chest pain is ischemically mediated.  Her enzymes are low and flat.  It could be related to demand ischemia.  Continue medical therapy will  be recommended.  The sheaths were removed and a TR band was placed on the right wrist to achieve patent hemostasis.  The patient left lab in stable condition.  The findings were reviewed with Dr. Dorris Carnes.  Fick Cardiac Output 7.58 L/min  Fick Cardiac Output Index 3.54 (L/min)/BSA  RA A Wave 12 mmHg  RA V Wave 12 mmHg  RA Mean 9 mmHg  RV Systolic Pressure 41 mmHg  RV Diastolic Pressure 6 mmHg  RV EDP 12 mmHg  PA Systolic Pressure 38 mmHg  PA Diastolic Pressure 9 mmHg  PA Mean 27 mmHg  PW A Wave 22 mmHg  PW V Wave 25 mmHg  PW Mean 21 mmHg  AO Systolic Pressure 92 mmHg  AO Diastolic Pressure 56 mmHg  AO Mean 72 mmHg  LV Systolic Pressure [id number] mmHg  LV Diastolic Pressure 6 mmHg  LV EDP 18 mmHg  AOp Systolic Pressure 2 mmHg  AOp Diastolic Pressure 0 mmHg  AOp Mean Pressure 2 mmHg  LVp Systolic Pressure A999333 mmHg  LVp Diastolic Pressure 5 mmHg  LVp EDP Pressure 19 mmHg  QP/QS 1  TPVR Index 7.63 HRUI  TSVR Index 20.31 HRUI  PVR SVR Ratio 0.1  TPVR/TSVR Ratio 0.37   Echo 04/01/2019 1. The left ventricle has hyperdynamic systolic function, with an ejection fraction of >65%. The cavity size was normal. There is severely increased left ventricular wall thickness. Left ventricular diastolic Doppler parameters are consistent with  pseudonormalization. No evidence of left ventricular regional wall motion abnormalities.  2. The right ventricle has normal systolic function. The cavity was normal. There is no increase in right ventricular wall thickness.  3. Left atrial size was mildly dilated.  4. Moderate thickening of the aortic valve. Moderate calcification of the aortic valve. Aortic valve regurgitation was not assessed by color flow Doppler. Moderate stenosis of the aortic valve.  5. Peak aortic valve velocity: 3.21m/s, mean gradient 26mmHg.  6. The aorta is abnormal in size and structure.  7. There is dilatation of the ascending aorta measuring 42 mm.  Laboratory  Data:  Chemistry Recent Labs  Lab 06/03/19  1618 06/03/19 1633  NA 135 137  K 3.8 3.9  CL 103 104  CO2 23  --   GLUCOSE 184* 178*  BUN 11 11  CREATININE 0.96 0.90  CALCIUM 9.0  --   GFRNONAA >60  --   GFRAA >60  --   ANIONGAP 9  --     Recent Labs  Lab 06/03/19 1618  PROT 7.2  ALBUMIN 2.9*  AST 23  ALT 18  ALKPHOS 41  BILITOT 0.4   Hematology Recent Labs  Lab 06/03/19 1618 06/03/19 1633  WBC 11.9*  --   RBC 4.10  --   HGB 8.4* 10.2*  HCT 31.6* 30.0*  MCV 77.1*  --   MCH 20.5*  --   MCHC 26.6*  --   RDW 19.1*  --   PLT 344  --     Radiology/Studies:  Ct Angio Head W Or Wo Contrast  Result Date: 06/04/2019 CLINICAL DATA:  Slurred speech and left arm weakness. EXAM: CT ANGIOGRAPHY HEAD AND NECK TECHNIQUE: Multidetector CT imaging of the head and neck was performed using the standard protocol during bolus administration of intravenous contrast. Multiplanar CT image reconstructions and MIPs were obtained to evaluate the vascular anatomy. Carotid stenosis measurements (when applicable) are obtained utilizing NASCET criteria, using the distal internal carotid diameter as the denominator. CONTRAST:  159mL OMNIPAQUE IOHEXOL 350 MG/ML SOLN COMPARISON:  Noncontrast head CT from yesterday FINDINGS: CTA NECK FINDINGS Aortic arch: Negative Right carotid system: No atheromatous changes. Vessels are diffusely patent without ulceration. Mild tortuosity at the skull base. Left carotid system: Vessels are smooth and widely patent. No noted atheromatous changes. ICA tortuosity at the skull base Vertebral arteries: No proximal subclavian stenosis. Fenestrated right V4 segment. The vertebral and basilar arteries are smooth and diffusely patent Skeleton: No acute or aggressive finding Other neck: Negative Upper chest: Tracheobronchomalacia. Review of the MIP images confirms the above findings CTA HEAD FINDINGS Anterior circulation: Vessels are smooth and widely patent. Ministry shin at the  left A1 2 junction. Negative for aneurysm. Posterior circulation: Vertebral and basilar arteries. No branch occlusion are smooth and diffusely patent or flow limiting stenosis Venous sinuses: Limited opacification in the arterial phase without detectable filling defect Anatomic variants: As above Review of the MIP images confirms the above findings IMPRESSION: No emergent finding or flow limiting stenosis. Electronically Signed   By: Monte Fantasia M.D.   On: 06/04/2019 04:22   Ct Head Wo Contrast  Result Date: 06/03/2019 CLINICAL DATA:  Slurred speech, left arm weakness and numbness. EXAM: CT HEAD WITHOUT CONTRAST TECHNIQUE: Contiguous axial images were obtained from the base of the skull through the vertex without intravenous contrast. COMPARISON:  None. FINDINGS: Brain: No acute intracranial abnormality. Specifically, no hemorrhage, hydrocephalus, mass lesion, acute infarction, or significant intracranial injury. Vascular: No hyperdense vessel or unexpected calcification. Skull: No acute calvarial abnormality. Sinuses/Orbits: Visualized paranasal sinuses and mastoids clear. Orbital soft tissues unremarkable. Other: None IMPRESSION: No intracranial abnormality. Electronically Signed   By: Rolm Baptise M.D.   On: 06/03/2019 18:41   Ct Angio Neck W Or Wo Contrast  Result Date: 06/04/2019 CLINICAL DATA:  Slurred speech and left arm weakness. EXAM: CT ANGIOGRAPHY HEAD AND NECK TECHNIQUE: Multidetector CT imaging of the head and neck was performed using the standard protocol during bolus administration of intravenous contrast. Multiplanar CT image reconstructions and MIPs were obtained to evaluate the vascular anatomy. Carotid stenosis measurements (when applicable) are obtained utilizing NASCET criteria, using the distal internal  carotid diameter as the denominator. CONTRAST:  146mL OMNIPAQUE IOHEXOL 350 MG/ML SOLN COMPARISON:  Noncontrast head CT from yesterday FINDINGS: CTA NECK FINDINGS Aortic arch:  Negative Right carotid system: No atheromatous changes. Vessels are diffusely patent without ulceration. Mild tortuosity at the skull base. Left carotid system: Vessels are smooth and widely patent. No noted atheromatous changes. ICA tortuosity at the skull base Vertebral arteries: No proximal subclavian stenosis. Fenestrated right V4 segment. The vertebral and basilar arteries are smooth and diffusely patent Skeleton: No acute or aggressive finding Other neck: Negative Upper chest: Tracheobronchomalacia. Review of the MIP images confirms the above findings CTA HEAD FINDINGS Anterior circulation: Vessels are smooth and widely patent. Ministry shin at the left A1 2 junction. Negative for aneurysm. Posterior circulation: Vertebral and basilar arteries. No branch occlusion are smooth and diffusely patent or flow limiting stenosis Venous sinuses: Limited opacification in the arterial phase without detectable filling defect Anatomic variants: As above Review of the MIP images confirms the above findings IMPRESSION: No emergent finding or flow limiting stenosis. Electronically Signed   By: Monte Fantasia M.D.   On: 06/04/2019 04:22   Mr Brain Wo Contrast  Result Date: 06/04/2019 CLINICAL DATA:  TIA, initial exam EXAM: MRI HEAD WITHOUT CONTRAST TECHNIQUE: Multiplanar, multiecho pulse sequences of the brain and surrounding structures were obtained without intravenous contrast. COMPARISON:  CT and CTA from yesterday and earlier today FINDINGS: Brain: No acute infarction, hemorrhage, hydrocephalus, extra-axial collection or mass lesion. Vascular: Normal flow voids. Skull and upper cervical spine: Normal marrow signal. Sinuses/Orbits: Negative. Other: Progressive and overall moderate motion artifact. IMPRESSION: Negative brain MRI with moderate motion degradation. Electronically Signed   By: Monte Fantasia M.D.   On: 06/04/2019 07:42    Assessment and Plan:   1. S/p AVR (23 mm Hancock in 2014) Echocardiogram 03/2019  showed moderate stenosis of the aortic valve with mean gradient 74mmHg.  With findings consistent with aortic stenosis. -She he is undergoing TAVR work-up after discussion with Dr. Burt Knack. -Coronary CT showed normal positioning well.  There is two hypo-attenuated areas under the leaflets that possibly represent leaflets thrombosis. -He is now presenting with transient neurological deficits.  This could be likely due to thrombus on valve leaflet.  Pending inpatient echocardiogram.  Likely she will need TEE (on Monday) while here and possible anticoagulation.  2.  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  As above - Will start IV heparin as she has chronic anemia to see response. Transition to coumadin after definitive plan.   3.  Ascending aortic aneurysm -42 mm by CT coronary. -Follow-up with yearly study.  4.  Chronic lymphedema/Cellulitis -Patient has open sores on left leg - Consider Ace wrap and wound nurse evaluation   5. Morbid obesity  Addendum: Patient with active uterine bleeding. Hold starting heparin until cleared by primary/Gyn. TEE scheduled Monday. Will need Consent and orders.   For questions or updates, please contact [place] Please consult [email or web address] for contact info under     Jarrett Soho, PA  06/04/2019 12:23 PM   I have examined the patient and reviewed assessment and plan and discussed with patient.  Agree with above as stated.  Start IV heparin for presumed valve thrombosis.  Plan for TEE Monday.  Plan explained to patient and husband.  She is agreeable.  Larae Grooms

## 2019-06-04 NOTE — Evaluation (Signed)
Physical Therapy Evaluation Patient Details Name: [name] MRN: [id number] DOB: [birth year] Today's Date: 06/04/2019   History of Present Illness  63yo female who experienced L UE weakness and slurred speech, symptoms resolved 15-20 minutes after onset. CT and MRI negative for acute changes. No tpa given. PMH BLE lymphedema, HTN, CHF, aortic stenosis s/p valve repair, renal insufficiency, dysfunctional uterine bleeding  Clinical Impression   Patient received in bed, very pleasant and eager to participate in PT, very talkative throughout session. Able to complete functional bed mobility, functional transfers, and gait in room with RW approximately 109f with Mod(I), no significant gait or balance deficits noted. Stayed in room today due to ongoing uterine bleeding, unable to find pad/panties that fit correctly. She was left up in the chair with all needs met and chair alarm active. She does not appear to be in need of skilled PT services in the acute setting as she is at her baseline for mobility and at Mod(I) level, but do recommend skilled OP PT services with lymphedema specialist to address B LE edema. PT signing off for now- thank you for the referral.     Follow Up Recommendations Outpatient PT(lymphedema PT for B LEs)    Equipment Recommendations  None recommended by PT    Recommendations for Other Services       Precautions / Restrictions Precautions Precautions: None Restrictions Weight Bearing Restrictions: No      Mobility  Bed Mobility Overal bed mobility: Modified Independent             General bed mobility comments: extended time  Transfers Overall transfer level: Modified independent Equipment used: Rolling walker (2 wheeled)             General transfer comment: distant S for safety, extended time  Ambulation/Gait Ambulation/Gait assistance: Modified independent (Device/Increase time) Gait Distance (Feet): 30 Feet Assistive device: Rolling walker  (2 wheeled) Gait Pattern/deviations: WFL(Within Functional Limits);Step-through pattern;Decreased step length - left;Decreased step length - right Gait velocity: WNL   General Gait Details: gait WNL, did not venture out of room due to uterine bleeding and not having briefs/pads that fit patient (husband is bringing some later today)  Stairs            Wheelchair Mobility    Modified Rankin (Stroke Patients Only)       Balance Overall balance assessment: Modified Independent;No apparent balance deficits (not formally assessed)                                           Pertinent Vitals/Pain Pain Assessment: No/denies pain    Home Living Family/patient expects to be discharged to:: Private residence Living Arrangements: Spouse/significant other;Children Available Help at Discharge: Family;Available 24 hours/day Type of Home: House Home Access: Stairs to enter Entrance Stairs-Rails: None Entrance Stairs-Number of Steps: 1 Home Layout: Two level;Able to live on main level with bedroom/bathroom Home Equipment: Walker - 4 wheels Additional Comments: uses rollator only occasionally    Prior Function Level of Independence: Independent with assistive device(s)         Comments: doing well, failry independent, driving     Hand Dominance   Dominant Hand: Right    Extremity/Trunk Assessment   Upper Extremity Assessment Upper Extremity Assessment: Defer to OT evaluation    Lower Extremity Assessment Lower Extremity Assessment: Overall WFL for tasks assessed    Cervical /  Trunk Assessment Cervical / Trunk Assessment: Normal  Communication   Communication: No difficulties  Cognition Arousal/Alertness: Awake/alert Behavior During Therapy: WFL for tasks assessed/performed Overall Cognitive Status: Within Functional Limits for tasks assessed                                 General Comments: A&Ox4      General Comments       Exercises     Assessment/Plan    PT Assessment All further PT needs can be met in the next venue of care  PT Problem List Decreased mobility;Decreased coordination;Obesity;Decreased activity tolerance       PT Treatment Interventions      PT Goals (Current goals can be found in the Care Plan section)  Acute Rehab PT Goals Patient Stated Goal: go home PT Goal Formulation: With patient Time For Goal Achievement: 06/18/19 Potential to Achieve Goals: Good    Frequency     Barriers to discharge        Co-evaluation               AM-PAC PT "6 Clicks" Mobility  Outcome Measure Help needed turning from your back to your side while in a flat bed without using bedrails?: None Help needed moving from lying on your back to sitting on the side of a flat bed without using bedrails?: None Help needed moving to and from a bed to a chair (including a wheelchair)?: A Little Help needed standing up from a chair using your arms (e.g., wheelchair or bedside chair)?: A Little Help needed to walk in hospital room?: A Little Help needed climbing 3-5 steps with a railing? : A Little 6 Click Score: 20    End of Session   Activity Tolerance: Patient tolerated treatment well Patient left: in chair;with call bell/phone within reach;with chair alarm set   PT Visit Diagnosis: Difficulty in walking, not elsewhere classified (R26.2);Other symptoms and signs involving the nervous system (R29.898)    Time: 7981-0254 PT Time Calculation (min) (ACUTE ONLY): 27 min   Charges:   PT Evaluation $PT Eval Low Complexity: 1 Low PT Treatments $Gait Training: 8-22 mins        Deniece Ree PT, DPT, CBIS  Supplemental Physical Therapist [facility]    Pager [phone] Acute Rehab Office [phone]

## 2019-06-04 NOTE — Progress Notes (Signed)
Notified Cardiology PA of Uterine bleeding in regard to heparin drip. PA asked me to hold of on drip until clearing with primary. Paged [facility] provider. Orders to start drip, and use peri pads to measure uterine bleeding.   Arlis Porta

## 2019-06-04 NOTE — Telephone Encounter (Signed)
FYI

## 2019-06-04 NOTE — Evaluation (Signed)
Speech Language Pathology Evaluation Patient Details Name: [name] MRN: [id number] DOB: 10/29/1955 Today's Date: 06/04/2019 Time: [id number] SLP Time Calculation (min) (ACUTE ONLY): 15 min  Problem List:  Patient Active Problem List   Diagnosis Date Noted  . Transient neurologic deficit 06/03/2019  . Anemia 06/03/2019  . Chronic diastolic CHF (congestive heart failure) ([place]) 06/03/2019  . CAD (coronary artery disease) 06/03/2019  . Essential hypertension 06/03/2019  . Sleep apnea 09/25/2018  . At risk for obstructive sleep apnea 08/14/2018  . Obstructive hypertrophic cardiomyopathy ([place]) 07/10/2018  . Dysfunctional uterine bleeding 04/22/2018  . Edema of both legs 02/20/2017  . 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 Morbid obesity ([place]) 02/20/2017  . Aortic stenosis 03/23/2013  . Hypertensive heart disease with heart failure ([place]) 03/23/2013   Past Medical History:  Past Medical History:  Diagnosis Date  . Aortic stenosis 03/23/2013   Overview:  02/18/13 TTE EF >55%. Critical AS with mean Ao valve gradient of 82 mm Hg. No AI. No MR, PR, mild TR. Estimated RVSP 30 mm Hg.  . Bicuspid aortic valve   . Edema of both legs 02/20/2017  . 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 Heart failure ([place])   . HTN (hypertension) 03/23/2013  . 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 Morbid obesity ([place]) 02/20/2017  . Sleep apnea    Past Surgical History:  Past Surgical History:  Procedure Laterality Date  . CHOLECYSTECTOMY  1983  . RIGHT/LEFT HEART CATH AND CORONARY ANGIOGRAPHY N/A 04/01/2019   Procedure: RIGHT/LEFT HEART CATH AND CORONARY ANGIOGRAPHY;  Surgeon: Lorretta Harp, MD;  Location: [facility];  Service: Cardiovascular;  Laterality: N/A;  . TEE WITHOUT CARDIOVERSION N/A 07/01/2018   Procedure: TRANSESOPHAGEAL ECHOCARDIOGRAM (TEE);  Surgeon: Buford Dresser, MD;  Location: [facility];  Service:  Cardiovascular;  Laterality: N/A;  . TISSUE AORTIC VALVE REPLACEMENT     HPI:  Pt is a 63 y.o. female with medical history significant for lymphedema involving the bilateral lower extremities, hypertension, chronic diastolic CHF, aortic stenosis status post bioprosthetic valve replacement, chronic renal insufficiency, and chronic anemia attributed to dysfunctional uterine bleeding, who presented to the emergency department after an episode of transient speech difficulty and left upper extremity numbness and weakness. MRI of the brain was negative for acute changes.    Assessment / Plan / Recommendation Clinical Impression  Pt participated in speech/language evaluation with her husband present. Both parties denied pt having any baseline deficits or acute changes in speech, language or cognition. Her speech and language skills are currently within normal limits and no overt cognitive deficits were noted. Further skilled SLP services are not clinically indicated at this time. Pt, nursing, and her husband were educated regarding results and recommendations; all parties verbalized understanding as well as agreement with plan of care.    SLP Assessment  SLP Recommendation/Assessment: Patient does not need any further Speech Lanaguage Pathology Services SLP Visit Diagnosis: Aphasia (R47.01)    Follow Up Recommendations  None    Frequency and Duration           SLP Evaluation Cognition  Overall Cognitive Status: Within Functional Limits for tasks assessed Arousal/Alertness: Awake/alert Orientation Level: Oriented X4 Attention: Focused;Sustained Focused Attention: Appears intact Sustained Attention: Appears intact Memory: Impaired Memory Impairment: Retrieval deficit(Immediate: 3/3; delayed: 1/3; with cues: 2/2) Awareness: Appears intact Problem Solving: Appears intact       Comprehension  Auditory Comprehension Overall Auditory Comprehension: Appears within functional  limits for tasks  assessed Yes/No Questions: Within Functional Limits Basic Biographical Questions: (5/5) Complex Questions: (5/5) Paragraph Comprehension (via yes/no questions): (2/4) Commands: Within Functional Limits Two Step Basic Commands: (4/4) Multistep Basic Commands: (3/3) Conversation: Simple Visual Recognition/Discrimination Discrimination: Within Function Limits    Expression Expression Primary Mode of Expression: Verbal Verbal Expression Overall Verbal Expression: Appears within functional limits for tasks assessed Initiation: No impairment Automatic Speech: Counting;Month of year;Day of week(WNL) Level of Generative/Spontaneous Verbalization: Conversation Repetition: No impairment(5/5) Naming: No impairment Pragmatics: No impairment Written Expression Dominant Hand: Right   Oral / Motor  Oral Motor/Sensory Function Overall Oral Motor/Sensory Function: Within functional limits Motor Speech Overall Motor Speech: Appears within functional limits for tasks assessed Respiration: Within functional limits Phonation: Normal Resonance: Within functional limits Articulation: Within functional limitis Intelligibility: Intelligible Motor Planning: Witnin functional limits Motor Speech Errors: Not applicable   Everlena Mackley I. Hardin Negus, [place], [place] Office number [phone] Pager [place] 06/04/2019, 1:13 PM

## 2019-06-04 NOTE — Progress Notes (Signed)
STROKE TEAM PROGRESS NOTE   INTERVAL HISTORY Pt sitting in chair, with some SOB, has to intermittently sitting up. However, awake alert, stated that she had left arm weakness and numbness for 10-48min. Now she is back to baseline. Cardiology on board, concerning for AV leaflets thrombosis. Pending TEE and now on heparin IV.   Vitals:   06/04/19 0410 06/04/19 0532 06/04/19 0909 06/04/19 1151  BP: 103/66 (!) 121/57 (!) 145/54 (!) 144/63  Pulse: 84 85 88 90  Resp: 16 20 20 20   Temp:  99.5 F (37.5 C) 98.2 F (36.8 C) 98.4 F (36.9 C)  TempSrc:  Oral Oral Oral  SpO2: 100% 99% 94% 97%  Weight:  127.6 kg    Height:  5\' 2"  (1.575 m)      CBC:  Recent Labs  Lab 06/03/19 1618 06/03/19 1633  WBC 11.9*  --   NEUTROABS 8.7*  --   HGB 8.4* 10.2*  HCT 31.6* 30.0*  MCV 77.1*  --   PLT 344  --     Basic Metabolic Panel:  Recent Labs  Lab 06/03/19 1618 06/03/19 1633  NA 135 137  K 3.8 3.9  CL 103 104  CO2 23  --   GLUCOSE 184* 178*  BUN 11 11  CREATININE 0.96 0.90  CALCIUM 9.0  --    Lipid Panel:     Component Value Date/Time   CHOL 123 06/04/2019 0507   CHOL 125 02/12/2019 1116   TRIG 111 06/04/2019 0507   HDL 30 (L) 06/04/2019 0507   HDL 37 (L) 02/12/2019 1116   CHOLHDL 4.1 06/04/2019 0507   VLDL 22 06/04/2019 0507   LDLCALC 71 06/04/2019 0507   LDLCALC 64 02/12/2019 1116   HgbA1c:  Lab Results  Component Value Date   HGBA1C 7.0 (H) 06/04/2019   Urine Drug Screen: No results found for: LABOPIA, COCAINSCRNUR, LABBENZ, AMPHETMU, THCU, LABBARB  Alcohol Level No results found for: ETH  IMAGING Ct Angio Head W Or Wo Contrast  Result Date: 06/04/2019 CLINICAL DATA:  Slurred speech and left arm weakness. EXAM: CT ANGIOGRAPHY HEAD AND NECK TECHNIQUE: Multidetector CT imaging of the head and neck was performed using the standard protocol during bolus administration of intravenous contrast. Multiplanar CT image reconstructions and MIPs were obtained to evaluate the  vascular anatomy. Carotid stenosis measurements (when applicable) are obtained utilizing NASCET criteria, using the distal internal carotid diameter as the denominator. CONTRAST:  154mL OMNIPAQUE IOHEXOL 350 MG/ML SOLN COMPARISON:  Noncontrast head CT from yesterday FINDINGS: CTA NECK FINDINGS Aortic arch: Negative Right carotid system: No atheromatous changes. Vessels are diffusely patent without ulceration. Mild tortuosity at the skull base. Left carotid system: Vessels are smooth and widely patent. No noted atheromatous changes. ICA tortuosity at the skull base Vertebral arteries: No proximal subclavian stenosis. Fenestrated right V4 segment. The vertebral and basilar arteries are smooth and diffusely patent Skeleton: No acute or aggressive finding Other neck: Negative Upper chest: Tracheobronchomalacia. Review of the MIP images confirms the above findings CTA HEAD FINDINGS Anterior circulation: Vessels are smooth and widely patent. Ministry shin at the left A1 2 junction. Negative for aneurysm. Posterior circulation: Vertebral and basilar arteries. No branch occlusion are smooth and diffusely patent or flow limiting stenosis Venous sinuses: Limited opacification in the arterial phase without detectable filling defect Anatomic variants: As above Review of the MIP images confirms the above findings IMPRESSION: No emergent finding or flow limiting stenosis. Electronically Signed   By: Neva Seat.D.  On: 06/04/2019 04:22   Ct Head Wo Contrast  Result Date: 06/03/2019 CLINICAL DATA:  Slurred speech, left arm weakness and numbness. EXAM: CT HEAD WITHOUT CONTRAST TECHNIQUE: Contiguous axial images were obtained from the base of the skull through the vertex without intravenous contrast. COMPARISON:  None. FINDINGS: Brain: No acute intracranial abnormality. Specifically, no hemorrhage, hydrocephalus, mass lesion, acute infarction, or significant intracranial injury. Vascular: No hyperdense vessel or  unexpected calcification. Skull: No acute calvarial abnormality. Sinuses/Orbits: Visualized paranasal sinuses and mastoids clear. Orbital soft tissues unremarkable. Other: None IMPRESSION: No intracranial abnormality. Electronically Signed   By: Rolm Baptise M.D.   On: 06/03/2019 18:41   Ct Angio Neck W Or Wo Contrast  Result Date: 06/04/2019 CLINICAL DATA:  Slurred speech and left arm weakness. EXAM: CT ANGIOGRAPHY HEAD AND NECK TECHNIQUE: Multidetector CT imaging of the head and neck was performed using the standard protocol during bolus administration of intravenous contrast. Multiplanar CT image reconstructions and MIPs were obtained to evaluate the vascular anatomy. Carotid stenosis measurements (when applicable) are obtained utilizing NASCET criteria, using the distal internal carotid diameter as the denominator. CONTRAST:  13mL OMNIPAQUE IOHEXOL 350 MG/ML SOLN COMPARISON:  Noncontrast head CT from yesterday FINDINGS: CTA NECK FINDINGS Aortic arch: Negative Right carotid system: No atheromatous changes. Vessels are diffusely patent without ulceration. Mild tortuosity at the skull base. Left carotid system: Vessels are smooth and widely patent. No noted atheromatous changes. ICA tortuosity at the skull base Vertebral arteries: No proximal subclavian stenosis. Fenestrated right V4 segment. The vertebral and basilar arteries are smooth and diffusely patent Skeleton: No acute or aggressive finding Other neck: Negative Upper chest: Tracheobronchomalacia. Review of the MIP images confirms the above findings CTA HEAD FINDINGS Anterior circulation: Vessels are smooth and widely patent. Ministry shin at the left A1 2 junction. Negative for aneurysm. Posterior circulation: Vertebral and basilar arteries. No branch occlusion are smooth and diffusely patent or flow limiting stenosis Venous sinuses: Limited opacification in the arterial phase without detectable filling defect Anatomic variants: As above Review of the  MIP images confirms the above findings IMPRESSION: No emergent finding or flow limiting stenosis. Electronically Signed   By: Monte Fantasia M.D.   On: 06/04/2019 04:22   Mr Brain Wo Contrast  Result Date: 06/04/2019 CLINICAL DATA:  TIA, initial exam EXAM: MRI HEAD WITHOUT CONTRAST TECHNIQUE: Multiplanar, multiecho pulse sequences of the brain and surrounding structures were obtained without intravenous contrast. COMPARISON:  CT and CTA from yesterday and earlier today FINDINGS: Brain: No acute infarction, hemorrhage, hydrocephalus, extra-axial collection or mass lesion. Vascular: Normal flow voids. Skull and upper cervical spine: Normal marrow signal. Sinuses/Orbits: Negative. Other: Progressive and overall moderate motion artifact. IMPRESSION: Negative brain MRI with moderate motion degradation. Electronically Signed   By: Monte Fantasia M.D.   On: 06/04/2019 07:42    PHYSICAL EXAM  Temp:  [98.1 F (36.7 C)-99.5 F (37.5 C)] 98.1 F (36.7 C) (09/25 1611) Pulse Rate:  [78-96] 96 (09/25 1611) Resp:  [14-20] 20 (09/25 1611) BP: (103-169)/(48-82) 110/58 (09/25 1611) SpO2:  [86 %-100 %] 98 % (09/25 1611) Weight:  [127.6 kg] 127.6 kg (09/25 0532)  General - morbid obesity, well developed, mild SOB.  Ophthalmologic - fundi not visualized due to noncooperation.  Cardiovascular - Regular rhythm and rate.  Mental Status -  Level of arousal and orientation to time, place, and person were intact. Language including expression, naming, repetition, comprehension was assessed and found intact.  Cranial Nerves II - XII - II -  Visual field intact OU. III, IV, VI - Extraocular movements intact. V - Facial sensation intact bilaterally. VII - Facial movement intact bilaterally. VIII - Hearing & vestibular intact bilaterally. X - Palate elevates symmetrically. XI - Chin turning & shoulder shrug intact bilaterally. XII - Tongue protrusion intact.  Motor Strength - The patient's strength was  normal in all extremities and pronator drift was absent.  Bulk was normal and fasciculations were absent.   Motor Tone - Muscle tone was assessed at the neck and appendages and was normal.  Reflexes - The patient's reflexes were symmetrical in all extremities and she had no pathological reflexes.  Sensory - Light touch, temperature/pinprick were assessed and were symmetrical.    Coordination - The patient had normal movements in the hands with no ataxia or dysmetria.  Tremor was absent.  Gait and Station - deferred.   ASSESSMENT/PLAN Ms. JANEI LILLA is a 63 y.o. female with history of aortic stenosis status post valve replacement, hypertension, morbid obesity, sleep apnea, bilateral lower extremity lymphedema, uterine bleeding currently on hormones presenting with L sided weakness and numbness and slurred speech.   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   CTA head & neck no LVO or significant stenosis   MRI  Unremarkable   2D Echo EF > 75%, moderate AS  TEE Monday per Cards   LDL 71  HgbA1c 7.0  IV heparin for VTE prophylaxis  aspirin 81 mg daily prior to admission, now on aspirin 81 mg daily and heparin IV. anthithrombotic regimen as per cardiology.   Therapy recommendations:  OP PT for lymphedema  Disposition:  pending   AV leaflet thrombosis  Coronary CT showed two hypo-attenuated areas under the leaflets that possibly represent leaflets thrombosis.  Cardiology on board  undergoing TAVR work-up  TEE monday  On heparin IV now - plan to transition to coumadin once definitive plan  Hypertension  Stable . Permissive hypertension (OK if < 180/105) but gradually normalize in 5-7 days . Long-term BP goal normotensive  Hyperlipidemia  Home meds:  pravachol 20  LDL 71, goal < 70  Now on pravachol 40  Continue statin at discharge  Diabetes type II Uncontrolled  HgbA1c 7.0, goal < 7.0  CBGs  SSI  Close  PCP follow up  Other Stroke Risk Factors  Morbid Obesity, Body mass index is 51.45 kg/m., recommend weight loss, diet and exercise as appropriate   Obstructive sleep apnea  Chronic Diastolic Congestive heart failure on torsemide  Other Active Problems  Microcytic anemia; DUB  CKD II  Lymphedema; cellulitis   Ascending aortic aneurysm  Hospital day # 0  Neurology will sign off. Please call with questions. Pt will follow up with stroke clinic NP at [facility] in about 4 weeks. Thanks for the consult.  Rosalin Hawking, MD PhD Stroke Neurology 06/04/2019 5:29 PM    To contact Stroke Continuity provider, please refer to [email or web address] After hours, contact General Neurology

## 2019-06-04 NOTE — Progress Notes (Addendum)
ANTICOAGULATION CONSULT NOTE - Initial Consult  Pharmacy Consult:  Heparin Indication:  Possible AV leaflet thrombosis  No Known Allergies  Patient Measurements: Height: 5\' 2"  (157.5 cm) Weight: 281 lb 4.9 oz (127.6 kg) IBW/kg (Calculated) : 50.1 Heparin Dosing Weight: 82 kg  Vital Signs: Temp: 98.4 F (36.9 C) (09/25 1151) Temp Source: Oral (09/25 1151) BP: 144/63 (09/25 1151) Pulse Rate: 90 (09/25 1151)  Labs: Recent Labs    06/03/19 1618 06/03/19 1633  HGB 8.4* 10.2*  HCT 31.6* 30.0*  PLT 344  --   APTT 31  --   LABPROT 13.7  --   INR 1.1  --   CREATININE 0.96 0.90    Estimated Creatinine Clearance: 80.8 mL/min (by C-G formula based on SCr of 0.9 mg/dL).   Medical History: Past Medical History:  Diagnosis Date  . Aortic stenosis 03/23/2013   Overview:  02/18/13 TTE EF >55%. Critical AS with mean Ao valve gradient of 82 mm Hg. No AI. No MR, PR, mild TR. Estimated RVSP 30 mm Hg.  . Bicuspid aortic valve   . Edema of both legs 02/20/2017  . 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 Heart failure ([place])   . HTN (hypertension) 03/23/2013  . 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 Morbid obesity ([place]) 02/20/2017  . Sleep apnea      Assessment: 73 YOF presented with speech difficulty, numbness and weakness.  Pharmacy consulted to dose IV heparin for possible AV leaflet thrombosis.  No bleeding reported.  Patient was previously sub-therapeutic on heparin 1650 units/hr (last admit in July 2020).  Goal of Therapy:  Heparin level 0.3 - 0.5 units/ml given TIA Monitor platelets by anticoagulation protocol: Yes   Plan:  Heparin gtt at 1600 units/hr, no bolus with TIA Check 6 hr heparin level Daily heparin level and CBC F/U TTE  Dmauri Rosenow D. Mina Marble, PharmD, BCPS, [place] 06/04/2019, 1:39 PM

## 2019-06-04 NOTE — ED Notes (Signed)
Patient transported to CT scan . 

## 2019-06-05 DIAGNOSIS — N938 Other specified abnormal uterine and vaginal bleeding: Secondary | ICD-10-CM

## 2019-06-05 DIAGNOSIS — G459 Transient cerebral ischemic attack, unspecified: Secondary | ICD-10-CM

## 2019-06-05 LAB — IRON AND TIBC
Iron: 21 ug/dL — ABNORMAL LOW (ref 28–170)
Saturation Ratios: 4 % — ABNORMAL LOW (ref 10.4–31.8)
TIBC: 508 ug/dL — ABNORMAL HIGH (ref 250–450)
UIBC: 487 ug/dL

## 2019-06-05 LAB — CBC
HCT: 31.6 % — ABNORMAL LOW (ref 36.0–46.0)
Hemoglobin: 8.6 g/dL — ABNORMAL LOW (ref 12.0–15.0)
MCH: 20.4 pg — ABNORMAL LOW (ref 26.0–34.0)
MCHC: 27.2 g/dL — ABNORMAL LOW (ref 30.0–36.0)
MCV: 75.1 fL — ABNORMAL LOW (ref 80.0–100.0)
Platelets: 371 10*3/uL (ref 150–400)
RBC: 4.21 MIL/uL (ref 3.87–5.11)
RDW: 19.4 % — ABNORMAL HIGH (ref 11.5–15.5)
WBC: 15.1 10*3/uL — ABNORMAL HIGH (ref 4.0–10.5)
nRBC: 0 % (ref 0.0–0.2)

## 2019-06-05 LAB — BASIC METABOLIC PANEL
Anion gap: 10 (ref 5–15)
BUN: 9 mg/dL (ref 8–23)
CO2: 27 mmol/L (ref 22–32)
Calcium: 8.4 mg/dL — ABNORMAL LOW (ref 8.9–10.3)
Chloride: 98 mmol/L (ref 98–111)
Creatinine, Ser: 1.06 mg/dL — ABNORMAL HIGH (ref 0.44–1.00)
GFR calc Af Amer: >60 mL/min (ref >60)
GFR calc non Af Amer: 55 mL/min — ABNORMAL LOW (ref >60)
Glucose, Bld: 117 mg/dL — ABNORMAL HIGH (ref 70–99)
Potassium: 3.1 mmol/L — ABNORMAL LOW (ref 3.5–5.1)
Sodium: 135 mmol/L (ref 135–145)

## 2019-06-05 LAB — HEPARIN LEVEL (UNFRACTIONATED)
Heparin Unfractionated: 0.28 IU/mL — ABNORMAL LOW (ref 0.30–0.70)
Heparin Unfractionated: 0.6 IU/mL (ref 0.30–0.70)

## 2019-06-05 LAB — TSH: TSH: 5.585 u[IU]/mL — ABNORMAL HIGH (ref 0.350–4.500)

## 2019-06-05 LAB — FERRITIN: Ferritin: 11 ng/mL (ref 11–307)

## 2019-06-05 LAB — VITAMIN B12: Vitamin B-12: 222 pg/mL (ref 180–914)

## 2019-06-05 MED ORDER — POTASSIUM CHLORIDE CRYS ER 20 MEQ PO TBCR
40.0000 meq | EXTENDED_RELEASE_TABLET | Freq: Once | ORAL | Status: AC
  Administered 2019-06-05: 40 meq via ORAL
  Filled 2019-06-05: qty 2

## 2019-06-05 MED ORDER — POTASSIUM CHLORIDE CRYS ER 20 MEQ PO TBCR: 40.0000 meq | EXTENDED_RELEASE_TABLET | Freq: Two times a day (BID) | ORAL | Status: DC

## 2019-06-05 MED ORDER — MAGNESIUM SULFATE 2 GM/50ML IV SOLN
2.0000 g | Freq: Once | INTRAVENOUS | Status: AC
  Administered 2019-06-05: 2 g via INTRAVENOUS
  Filled 2019-06-05: qty 50

## 2019-06-05 MED ORDER — METOPROLOL TARTRATE 50 MG PO TABS: 50.0000 mg | ORAL_TABLET | Freq: Two times a day (BID) | ORAL | Status: DC

## 2019-06-05 MED ORDER — POTASSIUM CHLORIDE CRYS ER 20 MEQ PO TBCR
40.0000 meq | EXTENDED_RELEASE_TABLET | Freq: Every day | ORAL | Status: DC
  Administered 2019-06-06 – 2019-06-09 (×4): 40 meq via ORAL
  Filled 2019-06-05 (×4): qty 2

## 2019-06-05 MED ORDER — METOPROLOL TARTRATE 12.5 MG HALF TABLET
12.5000 mg | ORAL_TABLET | Freq: Two times a day (BID) | ORAL | Status: DC
  Administered 2019-06-05 – 2019-06-07 (×4): 12.5 mg via ORAL
  Filled 2019-06-05 (×6): qty 1

## 2019-06-05 NOTE — Progress Notes (Signed)
ANTICOAGULATION CONSULT NOTE  Pharmacy Consult: Heparin Indication:  Possible AV leaflet thrombosis  No Known Allergies  Patient Measurements: Height: 5\' 2"  (157.5 cm) Weight: 272 lb 14.9 oz (123.8 kg) IBW/kg (Calculated) : 50.1 Heparin Dosing Weight: 82 kg  Vital Signs: Temp: 98 F (36.7 C) (09/26 1610) Temp Source: Oral (09/26 1610) BP: 151/61 (09/26 1610) Pulse Rate: 95 (09/26 1610)  Labs: Recent Labs    06/03/19 1618 06/03/19 1633 06/04/19 1933 06/05/19 0342 06/05/19 1406  HGB 8.4* 10.2*  --  8.6*  --   HCT 31.6* 30.0*  --  31.6*  --   PLT 344  --   --  371  --   APTT 31  --   --   --   --   LABPROT 13.7  --   --   --   --   INR 1.1  --   --   --   --   HEPARINUNFRC  --   --  0.16* 0.28* 0.60  CREATININE 0.96 0.90  --  1.06*  --     Estimated Creatinine Clearance: 67.4 mL/min (A) (by C-G formula based on SCr of 1.06 mg/dL (H)).   Assessment: 22 YOF presented with speech difficulty, numbness and weakness.  Pharmacy consulted to dose IV heparin for possible AV leaflet thrombosis.   Heparin level now above goal at 0.6, TEE pending. Pt has uterine bleeding which is stable.  Goal of Therapy:  Heparin level 0.3 - 0.5 units/ml given TIA Monitor platelets by anticoagulation protocol: Yes   Plan:  Reduce heparin to 1700 units/hr Recheck heparin level in morning   Arrie Senate, PharmD, BCPS Clinical Pharmacist [phone] Please check AMION for all [place] numbers 06/05/2019

## 2019-06-05 NOTE — Progress Notes (Signed)
PROGRESS NOTE  [name] [id number] DOB: October 01, 1955 DOA: 06/03/2019 PCP: [name], MD  HPI/Recap of past 24 hours: [name] is an 63 y.o. female with medical history significant forlymphedema involving the bilateral lower extremities, hypertension, chronic diastolic CHF, aortic stenosis status post bioprosthetic valve replacement, chronic renal insufficiency, and chronic anemia attributed to dysfunctional uterine bleeding, now presenting to the emergency department after an episode of transient speech difficulty and left upper extremity numbness and weakness.  06/05/19: Patient was seen and examined at her bedside this morning.  Appears weak with ambulation.  She denies any chest pain.  Admits to dyspnea with ambulation.  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  Leukocytosis, possibly in the setting of  cellulitis versus reactive WBC 15.1 from 11.9 Repeat CBC with differential in the morning Continue Keflex Obtain MRSA screen  Chronic diastolic CHF Appears hypervolemic On torsemide 40 mg daily Continue strict I's and O's and daily weight Monitor urine output, blood pressure, electrolytes and renal function.  Hypokalemia in the setting of ongoing diuresing Potassium 3.1 Repleted with KCl p.o. 40 mg x 2 doses Added magnesium IV 2 g once Obtain BMP and magnesium level in the morning  Chronic microcytic anemia with drop of hemoglobin Obtain iron studies No overt signs of bleeding Hemoglobin 8.6 from 10.2 MCV 75. Repeat CBC in the morning  Morbid obesity BMI 49 Recommend weight loss outpatient with regular physical activity and healthy dieting  Physical debility/ambulatory dysfunction PT assessed and recommended outpatient PT Fall precautions   DVT prophylaxis:  Heparin drip Code Status: Full Code.  Family Communication:    Disposition Plan:  Patient is currently not appropriate for discharge due to ongoing evaluation of present condition.  She has a planned TEE on Monday per cardiology to assess bioprosthetic valve.  We will discharge to home when cardiology signed off.   Objective: Vitals:   06/05/19 0415 06/05/19 0500 06/05/19 0740 06/05/19 1220  BP: 127/60  129/65 (!) 162/70  Pulse: 87  91 95  Resp: 20  20 20   Temp: 99.1 F (37.3 C)  98.1 F (36.7 C) 98.4 F (36.9 C)  TempSrc: Oral  Oral Oral  SpO2: 92%  95% 100%  Weight:  123.8 kg    Height:        Intake/Output Summary (Last 24 hours) at 06/05/2019 1256 Last data filed at 06/05/2019 0600 Gross per 24 hour  Intake 698.93 ml  Output -  Net 698.93 ml  Filed Weights   06/03/19 1617 06/04/19 0532 06/05/19 0500  Weight: 128.8 kg 127.6 kg 123.8 kg    Exam:  . General: 63 y.o. year-old female well developed well nourished in no acute distress.  Alert and oriented x3. . Cardiovascular: Regular rate and  rhythm with no rubs or gallops.  No thyromegaly or JVD noted.   Marland Kitchen Respiratory: Clear to auscultation with no wheezes or rales. Good inspiratory effort. . Abdomen: Soft nontender nondistended with normal bowel sounds x4 quadrants. . Musculoskeletal: 1+ pitting edema lower extremity bilaterally.  Erythema noted bilaterally. Marland Kitchen Psychiatry: Mood is appropriate for condition and setting   Data Reviewed: CBC: Recent Labs  Lab 06/03/19 1618 06/03/19 1633 06/05/19 0342  WBC 11.9*  --  15.1*  NEUTROABS 8.7*  --   --   HGB 8.4* 10.2* 8.6*  HCT 31.6* 30.0* 31.6*  MCV 77.1*  --  75.1*  PLT 344  --  123456   Basic Metabolic Panel: Recent Labs  Lab 06/03/19 1618 06/03/19 1633 06/05/19 0342  NA 135 137 135  K 3.8 3.9 3.1*  CL 103 104 98  CO2 23  --  27  GLUCOSE 184* 178* 117*  BUN 11 11 9   CREATININE 0.96 0.90 1.06*  CALCIUM 9.0  --  8.4*   GFR: Estimated Creatinine Clearance: 67.4 mL/min (A) (by C-G formula based on SCr of 1.06 mg/dL (H)). Liver Function Tests: Recent Labs  Lab 06/03/19 1618  AST 23  ALT 18  ALKPHOS 41  BILITOT 0.4  PROT 7.2  ALBUMIN 2.9*   No results for input(s): LIPASE, AMYLASE in the last 168 hours. No results for input(s): AMMONIA in the last 168 hours. Coagulation Profile: Recent Labs  Lab 06/03/19 1618  INR 1.1   Cardiac Enzymes: No results for input(s): CKTOTAL, CKMB, CKMBINDEX, TROPONINI in the last 168 hours. BNP (last 3 results) Recent Labs    06/26/18 1352 08/14/18 1058 02/12/19 1116  PROBNP 698* 876* 640*   HbA1C: Recent Labs    06/04/19 0507  HGBA1C 7.0*   CBG: Recent Labs  Lab 06/04/19 0105  GLUCAP 125*   Lipid Profile: Recent Labs    06/04/19 0507  CHOL 123  HDL 30*  LDLCALC 71  TRIG 111  CHOLHDL 4.1   Thyroid Function Tests: No results for input(s): TSH, T4TOTAL, FREET4, T3FREE, THYROIDAB in the last 72 hours. Anemia Panel: No results for input(s): VITAMINB12, FOLATE, FERRITIN, TIBC, IRON, RETICCTPCT in the  last 72 hours. Urine analysis: No results found for: COLORURINE, APPEARANCEUR, LABSPEC, PHURINE, GLUCOSEU, HGBUR, BILIRUBINUR, KETONESUR, PROTEINUR, UROBILINOGEN, NITRITE, LEUKOCYTESUR Sepsis Labs: @LABRCNTIP (procalcitonin:4,lacticidven:4)  ) Recent Results (from the past 240 hour(s))  SARS CORONAVIRUS 2 (TAT 6-24 HRS) Nasopharyngeal Nasopharyngeal Swab     Status: None   Collection Time: 06/03/19 10:31 PM   Specimen: Nasopharyngeal Swab  Result Value Ref Range Status   SARS Coronavirus 2 NEGATIVE NEGATIVE Final    Comment: (NOTE) SARS-CoV-2 target nucleic acids are NOT DETECTED. The SARS-CoV-2 RNA is generally detectable in upper and lower respiratory specimens during the acute phase of infection. Negative results do not preclude SARS-CoV-2 infection, do not rule out co-infections with other pathogens, and should not be used as the sole basis for treatment or other patient management decisions. Negative results must be combined with clinical observations, patient history, and epidemiological information. The expected result is Negative. Fact Sheet for Patients: SugarRoll.be Fact Sheet for Healthcare Providers: https://www.woods-mathews.com/ This test is not yet approved or cleared by the [place]  FDA and  has been authorized for detection and/or diagnosis of SARS-CoV-2 by FDA under an Emergency Use Authorization (EUA). This EUA will remain  in effect (meaning this test can be used) for the duration of the COVID-19 declaration under Section 56 4(b)(1) of the Act, 21 U.S.C. section 360bbb-3(b)(1), unless the authorization is terminated or revoked sooner. Performed at [facility], [place] [address] [place],  [address]       Studies: No results found.  Scheduled Meds: . aspirin EC  81 mg Oral Daily  . cephALEXin  500 mg Oral Q6H  . norethindrone  5 mg Oral BID  . potassium chloride  10 mEq Oral Daily  . pravastatin   40 mg Oral q1800  . sodium chloride flush  3 mL Intravenous Once  . sodium chloride flush  3 mL Intravenous Q12H  . torsemide  40 mg Oral Daily    Continuous Infusions: . sodium chloride    . heparin 1,800 Units/hr (06/05/19 0549)     LOS: 1 day     Kayleen Memos, MD [facility] Pager [phone]  If 7PM-7AM, please contact night-coverage [email or web address] Password [facility] 06/05/2019, 12:56 PM

## 2019-06-05 NOTE — Progress Notes (Addendum)
Progress Note  Patient Name: [name] Date of Encounter: 06/05/2019  Primary Cardiologist: Shirlee More, MD   Subjective   The neurological complaints have completely resolved and she has had not had recurrent problems denies angina and orthopnea, but has dyspnea walking to the bathroom.  Inpatient Medications    Scheduled Meds:  aspirin EC  81 mg Oral Daily   cephALEXin  500 mg Oral Q6H   norethindrone  5 mg Oral BID   potassium chloride  10 mEq Oral Daily   pravastatin  40 mg Oral q1800   sodium chloride flush  3 mL Intravenous Once   sodium chloride flush  3 mL Intravenous Q12H   torsemide  40 mg Oral Daily   Continuous Infusions:  sodium chloride     heparin 1,800 Units/hr (06/05/19 0549)   PRN Meds: sodium chloride, acetaminophen **OR** acetaminophen (TYLENOL) oral liquid 160 mg/5 mL **OR** acetaminophen, senna-docusate, sodium chloride flush   Vital Signs    Vitals:   06/04/19 2333 06/05/19 0415 06/05/19 0500 06/05/19 0740  BP: (!) 118/58 127/60  129/65  Pulse: 96 87  91  Resp: 19 20  20   Temp: 98.4 F (36.9 C) 99.1 F (37.3 C)  98.1 F (36.7 C)  TempSrc: Oral Oral  Oral  SpO2: 96% 92%  95%  Weight:   123.8 kg   Height:        Intake/Output Summary (Last 24 hours) at 06/05/2019 1133 Last data filed at 06/05/2019 0600 Gross per 24 hour  Intake 698.93 ml  Output --  Net 698.93 ml   Last 3 Weights 06/05/2019 06/04/2019 06/03/2019  Weight (lbs) 272 lb 14.9 oz 281 lb 4.9 oz 284 lb  Weight (kg) 123.8 kg 127.6 kg 128.822 kg      Telemetry    Sinus rhythm with very frequent premature atrial complexes, sometimes atrial couplets; true atrial fibrillation has not seen - Personally Reviewed  ECG    Sinus rhythm, mild lateral ST-T changes similar to previous tracings- Personally Reviewed  Physical Exam  Obese GEN: No acute distress.   Neck: No JVD Cardiac: RRR with frequent ectopic beats, 2/6 short mid peaking systolic ejection murmur  heard best at the right upper sternal border, radiating to the carotids no diastolic murmurs, rubs, or gallops.  Respiratory: Clear to auscultation bilaterally. GI: Soft, nontender, non-distended  MS: No edema; No deformity. Neuro:  Nonfocal  Psych: Normal affect   Labs    High Sensitivity Troponin:  No results for input(s): TROPONINIHS in the last 720 hours.    Chemistry Recent Labs  Lab 06/03/19 1618 06/03/19 1633 06/05/19 0342  NA 135 137 135  K 3.8 3.9 3.1*  CL 103 104 98  CO2 23  --  27  GLUCOSE 184* 178* 117*  BUN 11 11 9   CREATININE 0.96 0.90 1.06*  CALCIUM 9.0  --  8.4*  PROT 7.2  --   --   ALBUMIN 2.9*  --   --   AST 23  --   --   ALT 18  --   --   ALKPHOS 41  --   --   BILITOT 0.4  --   --   GFRNONAA >60  --  55*  GFRAA >60  --  >60  ANIONGAP 9  --  10     Hematology Recent Labs  Lab 06/03/19 1618 06/03/19 1633 06/05/19 0342  WBC 11.9*  --  15.1*  RBC 4.10  --  4.21  HGB 8.4* 10.2* 8.6*  HCT 31.6* 30.0* 31.6*  MCV 77.1*  --  75.1*  MCH 20.5*  --  20.4*  MCHC 26.6*  --  27.2*  RDW 19.1*  --  19.4*  PLT 344  --  371    BNPNo results for input(s): BNP, PROBNP in the last 168 hours.   DDimer No results for input(s): DDIMER in the last 168 hours.   Radiology    Ct Angio Head W Or Wo Contrast  Result Date: 06/04/2019 CLINICAL DATA:  Slurred speech and left arm weakness. EXAM: CT ANGIOGRAPHY HEAD AND NECK TECHNIQUE: Multidetector CT imaging of the head and neck was performed using the standard protocol during bolus administration of intravenous contrast. Multiplanar CT image reconstructions and MIPs were obtained to evaluate the vascular anatomy. Carotid stenosis measurements (when applicable) are obtained utilizing NASCET criteria, using the distal internal carotid diameter as the denominator. CONTRAST:  181mL OMNIPAQUE IOHEXOL 350 MG/ML SOLN COMPARISON:  Noncontrast head CT from yesterday FINDINGS: CTA NECK FINDINGS Aortic arch: Negative Right carotid  system: No atheromatous changes. Vessels are diffusely patent without ulceration. Mild tortuosity at the skull base. Left carotid system: Vessels are smooth and widely patent. No noted atheromatous changes. ICA tortuosity at the skull base Vertebral arteries: No proximal subclavian stenosis. Fenestrated right V4 segment. The vertebral and basilar arteries are smooth and diffusely patent Skeleton: No acute or aggressive finding Other neck: Negative Upper chest: Tracheobronchomalacia. Review of the MIP images confirms the above findings CTA HEAD FINDINGS Anterior circulation: Vessels are smooth and widely patent. Ministry shin at the left A1 2 junction. Negative for aneurysm. Posterior circulation: Vertebral and basilar arteries. No branch occlusion are smooth and diffusely patent or flow limiting stenosis Venous sinuses: Limited opacification in the arterial phase without detectable filling defect Anatomic variants: As above Review of the MIP images confirms the above findings IMPRESSION: No emergent finding or flow limiting stenosis. Electronically Signed   By: Monte Fantasia M.D.   On: 06/04/2019 04:22   Ct Head Wo Contrast  Result Date: 06/03/2019 CLINICAL DATA:  Slurred speech, left arm weakness and numbness. EXAM: CT HEAD WITHOUT CONTRAST TECHNIQUE: Contiguous axial images were obtained from the base of the skull through the vertex without intravenous contrast. COMPARISON:  None. FINDINGS: Brain: No acute intracranial abnormality. Specifically, no hemorrhage, hydrocephalus, mass lesion, acute infarction, or significant intracranial injury. Vascular: No hyperdense vessel or unexpected calcification. Skull: No acute calvarial abnormality. Sinuses/Orbits: Visualized paranasal sinuses and mastoids clear. Orbital soft tissues unremarkable. Other: None IMPRESSION: No intracranial abnormality. Electronically Signed   By: Rolm Baptise M.D.   On: 06/03/2019 18:41   Ct Angio Neck W Or Wo Contrast  Result Date:  06/04/2019 CLINICAL DATA:  Slurred speech and left arm weakness. EXAM: CT ANGIOGRAPHY HEAD AND NECK TECHNIQUE: Multidetector CT imaging of the head and neck was performed using the standard protocol during bolus administration of intravenous contrast. Multiplanar CT image reconstructions and MIPs were obtained to evaluate the vascular anatomy. Carotid stenosis measurements (when applicable) are obtained utilizing NASCET criteria, using the distal internal carotid diameter as the denominator. CONTRAST:  157mL OMNIPAQUE IOHEXOL 350 MG/ML SOLN COMPARISON:  Noncontrast head CT from yesterday FINDINGS: CTA NECK FINDINGS Aortic arch: Negative Right carotid system: No atheromatous changes. Vessels are diffusely patent without ulceration. Mild tortuosity at the skull base. Left carotid system: Vessels are smooth and widely patent. No noted atheromatous changes. ICA tortuosity at the skull base Vertebral arteries: No proximal subclavian stenosis. Fenestrated right  V4 segment. The vertebral and basilar arteries are smooth and diffusely patent Skeleton: No acute or aggressive finding Other neck: Negative Upper chest: Tracheobronchomalacia. Review of the MIP images confirms the above findings CTA HEAD FINDINGS Anterior circulation: Vessels are smooth and widely patent. Ministry shin at the left A1 2 junction. Negative for aneurysm. Posterior circulation: Vertebral and basilar arteries. No branch occlusion are smooth and diffusely patent or flow limiting stenosis Venous sinuses: Limited opacification in the arterial phase without detectable filling defect Anatomic variants: As above Review of the MIP images confirms the above findings IMPRESSION: No emergent finding or flow limiting stenosis. Electronically Signed   By: Monte Fantasia M.D.   On: 06/04/2019 04:22   Mr Brain Wo Contrast  Result Date: 06/04/2019 CLINICAL DATA:  TIA, initial exam EXAM: MRI HEAD WITHOUT CONTRAST TECHNIQUE: Multiplanar, multiecho pulse sequences  of the brain and surrounding structures were obtained without intravenous contrast. COMPARISON:  CT and CTA from yesterday and earlier today FINDINGS: Brain: No acute infarction, hemorrhage, hydrocephalus, extra-axial collection or mass lesion. Vascular: Normal flow voids. Skull and upper cervical spine: Normal marrow signal. Sinuses/Orbits: Negative. Other: Progressive and overall moderate motion artifact. IMPRESSION: Negative brain MRI with moderate motion degradation. Electronically Signed   By: Monte Fantasia M.D.   On: 06/04/2019 07:42    Cardiac Studies   Echocardiogram 06/04/2019 1. Left ventricular ejection fraction, by visual estimation, is >75%. The left ventricle has hyperdynamic function. Normal left ventricular size. There is mildly increased left ventricular hypertrophy.  2. Left ventricular diastolic Doppler parameters are consistent with pseudonormalization pattern of LV diastolic filling.  3. Global right ventricle has normal systolic function.The right ventricular size is normal. No increase in right ventricular wall thickness.  4. Left atrial size was mildly dilated.  5. Mild to moderate mitral annular calcification.  6. The tricuspid valve is grossly normal. Tricuspid valve regurgitation moderate-severe.  7. The aortic valve was not well visualized  8. AV is thickened, calcified with restricted motion. Peak and mean gradients through the valve are 43 and 25 mm Hg respectively consistent with mild to moderate AS. Since echo in July 2020, [place] in mean gradient.  TEE 07/01/2018 - Left ventricle: There was severe focal basal hypertrophy of the   septum. Systolic function was vigorous. The estimated ejection   fraction was in the range of 65% to 70%. Dynamic obstruction   cannot be excluded. Wall motion was normal; there were no   regional wall motion abnormalities. The outflow tract showed   muscular obstruction with a peak resting gradient of 52 mmHg. - Aortic valve: A  prosthesis was present and functioning normally.   The prosthesis had a normal range of motion. The sewing ring   appeared normal, had no rocking motion, and showed no evidence of   dehiscence. - Mitral valve: There was mild regurgitation. - Left atrium: No evidence of thrombus in the atrial cavity or   appendage. - Right ventricle: Systolic function was normal. - Right atrium: No evidence of thrombus in the atrial cavity or   appendage. - Atrial septum: There was a patent foramen ovale. - Tricuspid valve: There was trivial regurgitation.  Impressions:  - Images show normal function of bioprosthetic aortic valve.   Leaflets not fully visualized but appear to open well. Color   doppler shows open flow into valve orifice.     There is severe basal septal hypertrophy with what appears to be   LVOT obstruction, with a peak resting gradient of  52 mmHg. Due to   sedation, provocation maneuvers such as valsalva were unable to   be performed.     TEE suggests normal aortic valve function with LVOT gradient.  Cardiac catheterization 04/01/2019 IMPRESSION: Ms. Dorschner has normal coronary arteries and mildly elevated filling pressures.  I do not think her chest pain is ischemically mediated.  Her enzymes are low and flat.  It could be related to demand ischemia.  Continue medical therapy will be recommended.  The sheaths were removed and a TR band was placed on the right wrist to achieve patent hemostasis.  The patient left lab in stable condition.  The findings were reviewed with Dr. Dorris Carnes.   Patient Profile     63 y.o. 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  Mild ascending aortic aneurysm (4.7 cm, stable in size), no coronary artery disease.  Assessment & Plan    1. AS of bioprosthetic valve: Technical quality  of CT provided limited assessment of the aortic valve biological prosthesis.  For TEE on Monday (Dr. Harrell Gave, also did her previous TEE in October 2019) to separate the relative contribution of subvalvular obstruction due to basal septal hypertrophy/hyperdynamic left ventricular contraction versus premature deterioration of the bioprosthesis versus prosthetic valve leaflet thrombosis.  Regardless of findings, she would probably benefit from a course of full anticoagulation with warfarin for about 3 months and then reevaluation of the aortic valve gradients.  If gradients remain elevated, consider TAVR. 2. Asc Ao aneurysm: Relatively mild and stable in size, unlikely to change decision regarding medical management/TAVR/surgical AVR. 3. TIA: This could be secondary to bioprosthetic aortic valve leaflet thrombosis, but could also be secondary to as yet known diagnosed atrial fibrillation.  Extremely frequent PACs and brief runs of atrial tachycardia on telemetry.  She needs a minimum of 30-day event monitor at discharge, might even consider an implantable loop recorder. 4.  Chronic microcytic anemia: I am not sure if this has been confirmed to be secondary to iron deficiency, but this is almost certain.  Will order iron studies.  She has active uterine bleeding, and uncertain of the etiology.  This would have implications surrounding chronic anticoagulation. 5.  Morbid obesity: I am sure this is contributing to higher gradients across the left ventricular outflow tract and is part of the reason for her dyspnea. 6. LVOT obstruction: Prefer beta-blockers for treatment of hypertension, avoid excessive diuretics and avoid potent vasodilators.     For questions or updates, please contact [place] Please consult [email or web address] for contact info under        Signed, Sanda Klein, MD  06/05/2019, 11:33 AM

## 2019-06-05 NOTE — Progress Notes (Signed)
ANTICOAGULATION CONSULT NOTE  Pharmacy Consult: Heparin Indication:  Possible AV leaflet thrombosis  No Known Allergies  Patient Measurements: Height: 5\' 2"  (157.5 cm) Weight: 272 lb 14.9 oz (123.8 kg) IBW/kg (Calculated) : 50.1 Heparin Dosing Weight: 82 kg  Vital Signs: Temp: 99.1 F (37.3 C) (09/26 0415) Temp Source: Oral (09/26 0415) BP: 127/60 (09/26 0415) Pulse Rate: 87 (09/26 0415)  Labs: Recent Labs    06/03/19 1618 06/03/19 1633 06/04/19 1933 06/05/19 0342  HGB 8.4* 10.2*  --  8.6*  HCT 31.6* 30.0*  --  31.6*  PLT 344  --   --  371  APTT 31  --   --   --   LABPROT 13.7  --   --   --   INR 1.1  --   --   --   HEPARINUNFRC  --   --  0.16* 0.28*  CREATININE 0.96 0.90  --  1.06*    Estimated Creatinine Clearance: 67.4 mL/min (A) (by C-G formula based on SCr of 1.06 mg/dL (H)).   Assessment: 15 YOF presented with speech difficulty, numbness and weakness.  Pharmacy consulted to dose IV heparin for possible AV leaflet thrombosis.   9/26 AM update: Heparin level low but trending up  Goal of Therapy:  Heparin level 0.3 - 0.5 units/ml given TIA Monitor platelets by anticoagulation protocol: Yes   Plan:  Increase heparin drip to 1800 units/hr Re-check heparin level at 1400 Daily heparin level and CBC  Narda Bonds, PharmD, BCPS Clinical Pharmacist Phone: [phone]

## 2019-06-06 LAB — CBC
HCT: 32.5 % — ABNORMAL LOW (ref 36.0–46.0)
Hemoglobin: 9.1 g/dL — ABNORMAL LOW (ref 12.0–15.0)
MCH: 20.5 pg — ABNORMAL LOW (ref 26.0–34.0)
MCHC: 28 g/dL — ABNORMAL LOW (ref 30.0–36.0)
MCV: 73.4 fL — ABNORMAL LOW (ref 80.0–100.0)
Platelets: 413 10*3/uL — ABNORMAL HIGH (ref 150–400)
RBC: 4.43 MIL/uL (ref 3.87–5.11)
RDW: 19.4 % — ABNORMAL HIGH (ref 11.5–15.5)
WBC: 15.6 10*3/uL — ABNORMAL HIGH (ref 4.0–10.5)
nRBC: 0 % (ref 0.0–0.2)

## 2019-06-06 LAB — BASIC METABOLIC PANEL
Anion gap: 13 (ref 5–15)
BUN: 10 mg/dL (ref 8–23)
CO2: 26 mmol/L (ref 22–32)
Calcium: 8.3 mg/dL — ABNORMAL LOW (ref 8.9–10.3)
Chloride: 93 mmol/L — ABNORMAL LOW (ref 98–111)
Creatinine, Ser: 0.98 mg/dL (ref 0.44–1.00)
GFR calc Af Amer: >60 mL/min (ref >60)
GFR calc non Af Amer: >60 mL/min (ref >60)
Glucose, Bld: 128 mg/dL — ABNORMAL HIGH (ref 70–99)
Potassium: 3.2 mmol/L — ABNORMAL LOW (ref 3.5–5.1)
Sodium: 132 mmol/L — ABNORMAL LOW (ref 135–145)

## 2019-06-06 LAB — HEPARIN LEVEL (UNFRACTIONATED)
Heparin Unfractionated: 0.52 IU/mL (ref 0.30–0.70)
Heparin Unfractionated: 0.44 IU/mL (ref 0.30–0.70)

## 2019-06-06 LAB — MAGNESIUM: Magnesium: 2.1 mg/dL (ref 1.7–2.4)

## 2019-06-06 LAB — MRSA PCR SCREENING: MRSA by PCR: NEGATIVE

## 2019-06-06 MED ORDER — EPINEPHRINE 0.3 MG/0.3ML IJ SOAJ
0.3000 mg | INTRAMUSCULAR | Status: DC | PRN
  Filled 2019-06-06: qty 0.6

## 2019-06-06 MED ORDER — METHYLPREDNISOLONE SODIUM SUCC 125 MG IJ SOLR
60.0000 mg | Freq: Once | INTRAMUSCULAR | Status: AC
  Administered 2019-06-06: 60 mg via INTRAVENOUS
  Filled 2019-06-06: qty 2

## 2019-06-06 MED ORDER — VANCOMYCIN HCL 10 G IV SOLR: 1250.0000 mg | INTRAVENOUS | Status: DC

## 2019-06-06 MED ORDER — DIPHENHYDRAMINE HCL 50 MG/ML IJ SOLN
25.0000 mg | Freq: Once | INTRAMUSCULAR | Status: AC
  Administered 2019-06-06: 25 mg via INTRAVENOUS
  Filled 2019-06-06: qty 1

## 2019-06-06 MED ORDER — SODIUM CHLORIDE 0.9 % IV SOLN
510.0000 mg | Freq: Once | INTRAVENOUS | Status: AC
  Administered 2019-06-06: 510 mg via INTRAVENOUS
  Filled 2019-06-06: qty 17

## 2019-06-06 MED ORDER — VANCOMYCIN HCL 10 G IV SOLR
2000.0000 mg | Freq: Once | INTRAVENOUS | Status: DC
  Administered 2019-06-06: 2000 mg via INTRAVENOUS
  Filled 2019-06-06: qty 2000

## 2019-06-06 MED ORDER — FAMOTIDINE IN NACL 20-0.9 MG/50ML-% IV SOLN
20.0000 mg | Freq: Once | INTRAVENOUS | Status: AC
  Administered 2019-06-06: 20 mg via INTRAVENOUS
  Filled 2019-06-06: qty 50

## 2019-06-06 MED ORDER — POTASSIUM CHLORIDE CRYS ER 20 MEQ PO TBCR
40.0000 meq | EXTENDED_RELEASE_TABLET | Freq: Once | ORAL | Status: AC
  Administered 2019-06-06: 40 meq via ORAL
  Filled 2019-06-06: qty 2

## 2019-06-06 MED ORDER — DIPHENHYDRAMINE HCL 50 MG/ML IJ SOLN
25.0000 mg | Freq: Three times a day (TID) | INTRAMUSCULAR | Status: AC
  Administered 2019-06-06 – 2019-06-07 (×3): 25 mg via INTRAVENOUS
  Filled 2019-06-06 (×3): qty 1

## 2019-06-06 NOTE — Progress Notes (Signed)
ANTICOAGULATION CONSULT NOTE  Pharmacy Consult: Heparin Indication:  Possible AV leaflet thrombosis  No Known Allergies  Patient Measurements: Height: 5\' 2"  (157.5 cm) Weight: 249 lb 12.8 oz (113.3 kg) IBW/kg (Calculated) : 50.1 Heparin Dosing Weight: 82 kg  Vital Signs: Temp: 98.4 F (36.9 C) (09/27 1231) Temp Source: Oral (09/27 1231) BP: 136/60 (09/27 1231) Pulse Rate: 90 (09/27 1231)  Labs: Recent Labs    06/03/19 1618 06/03/19 1633  06/05/19 0342 06/05/19 1406 06/06/19 0518 06/06/19 1359  HGB 8.4* 10.2*  --  8.6*  --  9.1*  --   HCT 31.6* 30.0*  --  31.6*  --  32.5*  --   PLT 344  --   --  371  --  413*  --   APTT 31  --   --   --   --   --   --   LABPROT 13.7  --   --   --   --   --   --   INR 1.1  --   --   --   --   --   --   HEPARINUNFRC  --   --    < > 0.28* 0.60 0.52 0.44  CREATININE 0.96 0.90  --  1.06*  --  0.98  --    < > = values in this interval not displayed.    Estimated Creatinine Clearance: 69 mL/min (by C-G formula based on SCr of 0.98 mg/dL).   Assessment: 11 YOF presented with speech difficulty, numbness and weakness.  Pharmacy consulted to dose IV heparin for possible AV leaflet thrombosis.   Heparin level this afternoon is therapeutic after a rate decrease earlier today (HL 0.44 << 0.52, goal of 0.3-0.5). TEE planned 9/28. Pt has known uterine bleeding which is stable. Iron studies show low stores - receiving Feraheme for anemia.   Goal of Therapy:  Heparin level 0.3 - 0.5 units/ml given TIA Monitor platelets by anticoagulation protocol: Yes   Plan:  - Continue Heparin at 1650 units/hr (16.5 ml/hr) - Will continue to monitor for any signs/symptoms of bleeding and will follow up with heparin level in the a.m.   Thank you for allowing pharmacy to be a part of this patient's care.  Alycia Rossetti, PharmD, BCPS Clinical Pharmacist Clinical phone for 06/06/2019: [phone] 06/06/2019 2:46 PM   **Pharmacist phone directory can now be found  on [email or web address] ([email or web address]).  Listed under [place].

## 2019-06-06 NOTE — Progress Notes (Signed)
Received call around 1730 from bedside RN [place] regarding patient itching after IV vancomycin infusion. Ordered to stop IV vancomycin infusion and admister IV benadryl, IV pepcid and IV solumedrol.   New set of vital signs reassuring.   Continuous pulse oximetry ordered. Will continue to closely monitor.

## 2019-06-06 NOTE — Plan of Care (Signed)
  Problem: Education: Goal: Knowledge of General Education information will improve Description: Including pain rating scale, medication(s)/side effects and non-pharmacologic comfort measures Outcome: Progressing   Problem: Health Behavior/Discharge Planning: Goal: Ability to manage health-related needs will improve Outcome: Progressing   Problem: Clinical Measurements: Goal: Ability to maintain clinical measurements within normal limits will improve Outcome: Progressing Goal: Will remain free from infection Outcome: Progressing Goal: Diagnostic test results will improve Outcome: Progressing Goal: Respiratory complications will improve Outcome: Progressing Goal: Cardiovascular complication will be avoided Outcome: Progressing   Problem: Activity: Goal: Risk for activity intolerance will decrease Outcome: Progressing   Problem: Nutrition: Goal: Adequate nutrition will be maintained Outcome: Progressing   Problem: Coping: Goal: Level of anxiety will decrease Outcome: Progressing   Problem: Elimination: Goal: Will not experience complications related to bowel motility Outcome: Progressing Goal: Will not experience complications related to urinary retention Outcome: Progressing   Problem: Pain Managment: Goal: General experience of comfort will improve Outcome: Progressing   Problem: Safety: Goal: Ability to remain free from injury will improve Outcome: Progressing   Problem: Skin Integrity: Goal: Risk for impaired skin integrity will decrease Outcome: Progressing   Problem: Education: Goal: Knowledge of disease or condition will improve Outcome: Progressing Goal: Knowledge of secondary prevention will improve Outcome: Progressing Goal: Knowledge of patient specific risk factors addressed and post discharge goals established will improve Outcome: Progressing   Problem: Ischemic Stroke/TIA Tissue Perfusion: Goal: Complications of ischemic stroke/TIA will be  minimized Outcome: Progressing   Ival Bible, BSN, RN

## 2019-06-06 NOTE — Progress Notes (Signed)
At the beginning of Vancomycin administration, patient began to complain of "itching all over" Vancomycin was stopped. RN was notified by pt husband that pt began to "blackout and sliding out of bed" but pt did not fall. RN immediately notified DO Hall.  IV Benedryl ordered and given. Solumedrol and Pepcid will be given IV. Vital signs stable. Pt A&O x4. Nurse will continue to monitor.

## 2019-06-06 NOTE — Progress Notes (Signed)
ANTICOAGULATION CONSULT NOTE  Pharmacy Consult: Heparin Indication:  Possible AV leaflet thrombosis  No Known Allergies  Patient Measurements: Height: 5\' 2"  (157.5 cm) Weight: 249 lb 12.8 oz (113.3 kg) IBW/kg (Calculated) : 50.1 Heparin Dosing Weight: 82 kg  Vital Signs: Temp: 98.5 F (36.9 C) (09/27 0356) Temp Source: Oral (09/27 0356) BP: 122/67 (09/27 0356) Pulse Rate: 99 (09/27 0356)  Labs: Recent Labs    06/03/19 1618 06/03/19 1633  06/05/19 0342 06/05/19 1406 06/06/19 0518  HGB 8.4* 10.2*  --  8.6*  --  9.1*  HCT 31.6* 30.0*  --  31.6*  --  32.5*  PLT 344  --   --  371  --  413*  APTT 31  --   --   --   --   --   LABPROT 13.7  --   --   --   --   --   INR 1.1  --   --   --   --   --   HEPARINUNFRC  --   --    < > 0.28* 0.60 0.52  CREATININE 0.96 0.90  --  1.06*  --  0.98   < > = values in this interval not displayed.    Estimated Creatinine Clearance: 69 mL/min (by C-G formula based on SCr of 0.98 mg/dL).   Assessment: 62 YOF presented with speech difficulty, numbness and weakness.  Pharmacy consulted to dose IV heparin for possible AV leaflet thrombosis.   9/27 AM update: Heparin level just above goal  Goal of Therapy:  Heparin level 0.3 - 0.5 units/ml given TIA Monitor platelets by anticoagulation protocol: Yes   Plan:  Dec heparin to 1650 units/hr Re-check heparin level at 1400 Daily heparin level and CBC  Narda Bonds, PharmD, BCPS Clinical Pharmacist Phone: [phone]

## 2019-06-06 NOTE — Progress Notes (Signed)
Dr. Silas [place] informed of pt's potassium of 3.4

## 2019-06-06 NOTE — Progress Notes (Signed)
PROGRESS NOTE  [name] [id number] DOB: [birth year] DOA: 06/03/2019 PCP: [name], MD  HPI/Recap of past 24 hours: [name] is an 63 y.o. female with medical history significant forlymphedema involving the bilateral lower extremities, hypertension, chronic diastolic CHF, aortic stenosis status post bioprosthetic valve replacement, chronic renal insufficiency, and chronic anemia attributed to dysfunctional uterine bleeding, now presenting to the emergency department after an episode of transient speech difficulty and left upper extremity numbness and weakness.  06/06/19: Patient seen and examined at her bedside this morning. Non improving erythema, edema and warmth noted in her left lower extremity.  Worsening leukocytosis on Keflex.  Switch antibiotic to IV vancomycin.  Admits to dyspnea with ambulation.  No chest pain or palpitation.  Per bedside nurse patient has been having intermittent uterine bleeding.   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 which she received from 06/04/19 to 06/06/2019 Persistent erythema, edema, warmth and  discomfort Worsening leukocytosis Switch to IV vancomycin, renal function and renally dosing monitored by pharmacy  Ascending aortic aneurysm Mild and stable SBP goal 120  LVOT obstruction Per cardiology avoid excessive diuretics and avoid potential vasodilators On torsemide 40 mg daily Defer to cardiology.  His cardiac medications  Lymphedema complicated by LLE cellulitis Management as noted above  Worsening leukocytosis, likely in the setting of cellulitis versus reactive WBC 15.6 from 15.1 from 11.9 Obtain MRSA screen  Chronic diastolic CHF Appears hypervolemic On torsemide 40 mg daily Continue strict I's and O's and daily weight Monitor urine output, blood pressure, electrolytes and renal function.  Hypokalemia in the setting of ongoing diuresing Potassium 3.1>> 3.2 on 06/06/2019 Repleted with KCl p.o. 40 mg x 2 doses Repeat BMP in the morning  Euvolemic hyponatremia Sodium 132 from 135 yesterday Euvolemic on exam  Intermittent uterine bleeding in the setting of post menopausal bleeding diagnosed a year ago Will need to follow-up with gynecology outpatient Hemoglobin stable at 9.1 from 8.6 yesterday  Severe iron deficiency anemia Iron studies revealed trans sat 4, iron 21, ferritin 11 Hemoglobin 9.1 on 06/06/2019 with MCV 73 Transfused 1 dose of IV Feraheme on 510 mg 1 Repeat CBC in the morning  Morbid obesity BMI 49 Recommend weight loss outpatient with regular physical activity and healthy dieting  Physical debility/ambulatory dysfunction PT assessed and recommended outpatient PT Fall precautions   DVT prophylaxis:  Heparin drip Code Status: Full Code.  Family Communication:   Antibiotics: keflex 06/04/19>>06/06/19. IV vancomycin 06/06/19>>  Disposition Plan:  Patient is currently not appropriate for discharge due to ongoing evaluation of present condition.  She has a planned TEE on Monday per cardiology to assess bioprosthetic valve.  We will discharge  to  home when cardiology signed off.   Objective: Vitals:   06/06/19 0017 06/06/19 0356 06/06/19 0608 06/06/19 0739  BP: 122/74 122/67  121/74  Pulse: 96 99  94  Resp: 19 19  18   Temp: 98.3 F (36.8 C) 98.5 F (36.9 C)  98.6 F (37 C)  TempSrc:  Oral  Oral  SpO2: 97% 97%  96%  Weight:   113.3 kg   Height:        Intake/Output Summary (Last 24 hours) at 06/06/2019 1110 Last data filed at 06/05/2019 1300 Gross per 24 hour  Intake 320 ml  Output -  Net 320 ml   Filed Weights   06/04/19 0532 06/05/19 0500 06/06/19 [id number]  Weight: 127.6 kg 123.8 kg 113.3 kg    Exam:  . General: 63 y.o. year-old female obese in no acute distress.  Alert and oriented x3.   . Cardiovascular: Regular rate and rhythm no rubs or gallops no JVD or thyromegaly noted. Marland Kitchen Respiratory: Clear to auscultation.  No wheezes or rales.  Good inspiratory effort. . Abdomen: Obese nontender nondistended normal bowel sounds present.  . Musculoskeletal: 2+ pitting edema in left lower extremity with erythema, warmth noted. Marland Kitchen Psychiatry: Mood is appropriate for condition and setting.   Data Reviewed: CBC: Recent Labs  Lab 06/03/19 1618 06/03/19 1633 06/05/19 0342 06/06/19 0518  WBC 11.9*  --  15.1* 15.6*  NEUTROABS 8.7*  --   --   --   HGB 8.4* 10.2* 8.6* 9.1*  HCT 31.6* 30.0* 31.6* 32.5*  MCV 77.1*  --  75.1* 73.4*  PLT 344  --  371 [id number]*   Basic Metabolic Panel: Recent Labs  Lab 06/03/19 1618 06/03/19 1633 06/05/19 0342 06/06/19 0518  NA 135 137 135 132*  K 3.8 3.9 3.1* 3.2*  CL 103 104 98 93*  CO2 23  --  27 26  GLUCOSE 184* 178* 117* 128*  BUN 11 11 9 10   CREATININE 0.96 0.90 1.06* 0.98  CALCIUM 9.0  --  8.4* 8.3*  MG  --   --   --  2.1   GFR: Estimated Creatinine Clearance: 69 mL/min (by C-G formula based on SCr of 0.98 mg/dL). Liver Function Tests: Recent Labs  Lab 06/03/19 1618  AST 23  ALT 18  ALKPHOS 41  BILITOT 0.4  PROT 7.2  ALBUMIN 2.9*   No results for input(s): LIPASE,  AMYLASE in the last 168 hours. No results for input(s): AMMONIA in the last 168 hours. Coagulation Profile: Recent Labs  Lab 06/03/19 1618  INR 1.1   Cardiac Enzymes: No results for input(s): CKTOTAL, CKMB, CKMBINDEX, TROPONINI in the last 168 hours. BNP (last 3 results) Recent Labs    06/26/18 1352 08/14/18 1058 02/12/19 1116  PROBNP 698* 876* 640*   HbA1C: Recent Labs    06/04/19 0507  HGBA1C 7.0*   CBG: Recent Labs  Lab 06/04/19 0105  GLUCAP 125*   Lipid Profile: Recent Labs    06/04/19 0507  CHOL 123  HDL 30*  LDLCALC 71  TRIG 111  CHOLHDL 4.1   Thyroid Function Tests: Recent Labs    06/05/19 1650  TSH 5.585*   Anemia Panel: Recent Labs    06/05/19 1650  VITAMINB12 222  FERRITIN 11  TIBC 508*  IRON 21*   Urine analysis: No results found for: COLORURINE, APPEARANCEUR, LABSPEC, PHURINE, GLUCOSEU, HGBUR, BILIRUBINUR, KETONESUR, PROTEINUR, UROBILINOGEN, NITRITE, LEUKOCYTESUR Sepsis Labs: @LABRCNTIP (procalcitonin:4,lacticidven:4)  ) Recent Results (from the past 240 hour(s))  SARS CORONAVIRUS 2 (  TAT 6-24 HRS) Nasopharyngeal Nasopharyngeal Swab     Status: None   Collection Time: 06/03/19 10:31 PM   Specimen: Nasopharyngeal Swab  Result Value Ref Range Status   SARS Coronavirus 2 NEGATIVE NEGATIVE Final    Comment: (NOTE) SARS-CoV-2 target nucleic acids are NOT DETECTED. The SARS-CoV-2 RNA is generally detectable in upper and lower respiratory specimens during the acute phase of infection. Negative results do not preclude SARS-CoV-2 infection, do not rule out co-infections with other pathogens, and should not be used as the sole basis for treatment or other patient management decisions. Negative results must be combined with clinical observations, patient history, and epidemiological information. The expected result is Negative. Fact Sheet for Patients: SugarRoll.be Fact Sheet for Healthcare Providers:  https://www.woods-mathews.com/ This test is not yet approved or cleared by the [place] FDA and  has been authorized for detection and/or diagnosis of SARS-CoV-2 by FDA under an Emergency Use Authorization (EUA). This EUA will remain  in effect (meaning this test can be used) for the duration of the COVID-19 declaration under Section 56 4(b)(1) of the Act, 21 U.S.C. section 360bbb-3(b)(1), unless the authorization is terminated or revoked sooner. Performed at [facility], [place] [address] [place], [place] [address]       Studies: No results found.  Scheduled Meds: . aspirin EC  81 mg Oral Daily  . metoprolol tartrate  12.5 mg Oral BID  . norethindrone  5 mg Oral BID  . potassium chloride  40 mEq Oral Daily  . potassium chloride  40 mEq Oral BID  . pravastatin  40 mg Oral q1800  . sodium chloride flush  3 mL Intravenous Once  . sodium chloride flush  3 mL Intravenous Q12H  . torsemide  40 mg Oral Daily    Continuous Infusions: . sodium chloride    . heparin 1,650 Units/hr (06/06/19 [id number])     LOS: 2 days     Kayleen Memos, MD [facility] Pager [phone]  If 7PM-7AM, please contact night-coverage [email or web address] Password [email or web address] 06/06/2019, 11:10 AM

## 2019-06-06 NOTE — Progress Notes (Signed)
Pharmacy Antibiotic Note  [name] is a 63 y.o. female admitted on 06/03/2019 with lymphedema/cellulitis - no improved on Keflex and pharmacy has been consulted for Vancomycin dosing.  SCr 0.98, CrCl~60-80 ml/min. Wt: 113 kg  Plan: - Vancomycin 2g IV x 1 followed by 1250 mg/24h (est AUC 525, SCr 0.98, Vd 0.5) - Will continue to follow renal function, culture results, LOT, and antibiotic de-escalation plans   Height: 5\' 2"  (157.5 cm) Weight: 249 lb 12.8 oz (113.3 kg) IBW/kg (Calculated) : 50.1  Temp (24hrs), Avg:98.4 F (36.9 C), Min:98 F (36.7 C), Max:98.8 F (37.1 C)  Recent Labs  Lab 06/03/19 1618 06/03/19 1633 06/05/19 0342 06/06/19 0518  WBC 11.9*  --  15.1* 15.6*  CREATININE 0.96 0.90 1.06* 0.98    Estimated Creatinine Clearance: 69 mL/min (by C-G formula based on SCr of 0.98 mg/dL).    No Known Allergies  Antimicrobials this admission: Keflex 9/25 >> 9/27 Vanc 9/27 >>  Dose adjustments this admission: n/a  Microbiology results: 9/27 COVID >> neg  Thank you for allowing pharmacy to be a part of this patient's care.  Alycia Rossetti, PharmD, BCPS Clinical Pharmacist Clinical phone for 06/06/2019: [phone] 06/06/2019 1:05 PM   **Pharmacist phone directory can now be found on [email or web address] ([email or web address]).  Listed under [place].

## 2019-06-06 NOTE — Progress Notes (Signed)
Progress Note  Patient Name: [name] Date of Encounter: 06/06/2019  Primary Cardiologist: Shirlee More, MD   Subjective   Able to sleep fully supine. Dyspnea with light activity.  Inpatient Medications    Scheduled Meds: . aspirin EC  81 mg Oral Daily  . metoprolol tartrate  12.5 mg Oral BID  . norethindrone  5 mg Oral BID  . potassium chloride  40 mEq Oral Daily  . potassium chloride  40 mEq Oral Once  . pravastatin  40 mg Oral q1800  . sodium chloride flush  3 mL Intravenous Once  . sodium chloride flush  3 mL Intravenous Q12H  . torsemide  40 mg Oral Daily   Continuous Infusions: . sodium chloride    . ferumoxytol    . heparin 1,650 Units/hr (06/06/19 1110)   PRN Meds: sodium chloride, acetaminophen **OR** acetaminophen (TYLENOL) oral liquid 160 mg/5 mL **OR** acetaminophen, senna-docusate, sodium chloride flush   Vital Signs    Vitals:   06/06/19 0017 06/06/19 0356 06/06/19 0608 06/06/19 0739  BP: 122/74 122/67  121/74  Pulse: 96 99  94  Resp: 19 19  18   Temp: 98.3 F (36.8 C) 98.5 F (36.9 C)  98.6 F (37 C)  TempSrc:  Oral  Oral  SpO2: 97% 97%  96%  Weight:   113.3 kg   Height:        Intake/Output Summary (Last 24 hours) at 06/06/2019 1142 Last data filed at 06/05/2019 1300 Gross per 24 hour  Intake 320 ml  Output -  Net 320 ml   Last 3 Weights 06/06/2019 06/05/2019 06/04/2019  Weight (lbs) 249 lb 12.8 oz 272 lb 14.9 oz 281 lb 4.9 oz  Weight (kg) 113.309 kg 123.8 kg 127.6 kg      Telemetry    NSR, on brief PAT, no atrial fibrillation is detected - Personally Reviewed  ECG    No new tracing - Personally Reviewed  Physical Exam  Morbidly obese GEN: No acute distress.   Neck: No JVD Cardiac: RRR, midpeaking ejection murmur RUSB, no change w Valsalva, no diastolic murmurs, rubs, or gallops.  Respiratory: Clear to auscultation bilaterally. GI: Soft, nontender, non-distended  MS: B ankle and calf edema, L>R with redness and warmth; No  deformity. Neuro:  Nonfocal  Psych: Normal affect   Labs    High Sensitivity Troponin:  No results for input(s): TROPONINIHS in the last 720 hours.    Chemistry Recent Labs  Lab 06/03/19 1618 06/03/19 1633 06/05/19 0342 06/06/19 0518  NA 135 137 135 132*  K 3.8 3.9 3.1* 3.2*  CL 103 104 98 93*  CO2 23  --  27 26  GLUCOSE 184* 178* 117* 128*  BUN 11 11 9 10   CREATININE 0.96 0.90 1.06* 0.98  CALCIUM 9.0  --  8.4* 8.3*  PROT 7.2  --   --   --   ALBUMIN 2.9*  --   --   --   AST 23  --   --   --   ALT 18  --   --   --   ALKPHOS 41  --   --   --   BILITOT 0.4  --   --   --   GFRNONAA >60  --  55* >60  GFRAA >60  --  >60 >60  ANIONGAP 9  --  10 13     Hematology Recent Labs  Lab 06/03/19 1618 06/03/19 1633 06/05/19 0342 06/06/19 0518  WBC  11.9*  --  15.1* 15.6*  RBC 4.10  --  4.21 4.43  HGB 8.4* 10.2* 8.6* 9.1*  HCT 31.6* 30.0* 31.6* 32.5*  MCV 77.1*  --  75.1* 73.4*  MCH 20.5*  --  20.4* 20.5*  MCHC 26.6*  --  27.2* 28.0*  RDW 19.1*  --  19.4* 19.4*  PLT 344  --  371 413*    BNPNo results for input(s): BNP, PROBNP in the last 168 hours.   DDimer No results for input(s): DDIMER in the last 168 hours.   Radiology    No results found.  Cardiac Studies   Echocardiogram 06/04/2019 1. Left ventricular ejection fraction, by visual estimation, is >75%. The left ventricle has hyperdynamic function. Normal left ventricular size. There is mildly increased left ventricular hypertrophy. 2. Left ventricular diastolic Doppler parameters are consistent with pseudonormalization pattern of LV diastolic filling. 3. Global right ventricle has normal systolic function.The right ventricular size is normal. No increase in right ventricular wall thickness. 4. Left atrial size was mildly dilated. 5. Mild to moderate mitral annular calcification. 6. The tricuspid valve is grossly normal. Tricuspid valve regurgitation moderate-severe. 7. The aortic valve was not well  visualized 8. AV is thickened, calcified with restricted motion. Peak and mean gradients through the valve are 43 and 25 mm Hg respectively consistent with mild to moderate AS. Since echo in July 2020, [place] in mean gradient.  TEE 07/01/2018 - Left ventricle: There was severe focal basal hypertrophy of the septum. Systolic function was vigorous. The estimated ejection fraction was in the range of 65% to 70%. Dynamic obstruction cannot be excluded. Wall motion was normal; there were no regional wall motion abnormalities. The outflow tract showed muscular obstruction with a peak resting gradient of 52 mmHg. - Aortic valve: A prosthesis was present and functioning normally. The prosthesis had a normal range of motion. The sewing ring appeared normal, had no rocking motion, and showed no evidence of dehiscence. - Mitral valve: There was mild regurgitation. - Left atrium: No evidence of thrombus in the atrial cavity or appendage. - Right ventricle: Systolic function was normal. - Right atrium: No evidence of thrombus in the atrial cavity or appendage. - Atrial septum: There was a patent foramen ovale. - Tricuspid valve: There was trivial regurgitation.  Impressions:  - Images show normal function of bioprosthetic aortic valve. Leaflets not fully visualized but appear to open well. Color doppler shows open flow into valve orifice.  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 Due to sedation, provocation maneuvers such as valsalva were unable to be performed.  TEE suggests normal aortic valve function with LVOT gradient.  Cardiac catheterization 04/01/2019 IMPRESSION:Ms. Zora has normal coronary arteries and mildly elevated filling pressures. I do not think her chest pain is ischemically mediated. Her enzymes are low and flat. It could be related to demand ischemia.  Continue medical therapy will be recommended. The sheaths were removed and a TR band was placed on the right wrist to achieve patent hemostasis. The patient left lab in stable condition. The findings were reviewed with Dr. Dorris Carnes.  Patient Profile     63 y.o. 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  Mild ascending aortic aneurysm (4.7 cm, stable in size), no coronary artery disease. Cellulitis L leg.  Assessment &  Plan    1. AS of bioprosthetic valve: Technical quality of CT provided limited assessment of the aortic valve biological prosthesis.  For TEE tomorrow (Dr. Harrell Gave, also did her previous TEE in October 2019). This procedure has been fully reviewed with the patient and written informed consent has been obtained. TEE to try to separate the relative contribution of subvalvular obstruction due to basal septal hypertrophy/hyperdynamic left ventricular contraction versus premature deterioration of the bioprosthesis versus prosthetic valve leaflet thrombosis. Regardless of findings, she would probably benefit from a course of full anticoagulation with warfarin for about 3 months and then reevaluation of the aortic valve gradients.  If gradients remain elevated, consider TAVR. 2. Asc Ao aneurysm: Relatively mild and stable in size, unlikely to change decision regarding medical management/TAVR/surgical AVR. 3. TIA: This could be secondary to bioprosthetic aortic valve leaflet thrombosis, but could also be secondary to as yet known diagnosed atrial fibrillation.  Extremely frequent PACs and brief runs of atrial tachycardia on telemetry.  She needs a minimum of 30-day event monitor at discharge, might even consider an implantable loop recorder. 4.  Chronic microcytic anemia: ongoing dysfunctional/postmenopausal  bleeding. Etiology not yet defined.  This would have implications surrounding chronic anticoagulation. Anemia exaggerates the aortic outflow gradients. 5.  Morbid obesity: I am sure this is contributing to higher gradients across the left ventricular outflow tract and is part of the reason for her dyspnea. 6. LVOT obstruction: Prefer beta-blockers for treatment of hypertension, avoid excessive diuretics and avoid potent vasodilators. In/out and weight are unreliable. Creatinine normal, but Na lower, will hold the diuretic if trend worsens tomorrow.     For questions or updates, please contact [place] Please consult [email or web address] for contact info under        Signed, Sanda Klein, MD  06/06/2019, 11:42 AM

## 2019-06-06 NOTE — H&P (View-Only) (Signed)
Progress Note  Patient Name: [name] Date of Encounter: 06/06/2019  Primary Cardiologist: Shirlee More, MD   Subjective   Able to sleep fully supine. Dyspnea with light activity.  Inpatient Medications    Scheduled Meds: . aspirin EC  81 mg Oral Daily  . metoprolol tartrate  12.5 mg Oral BID  . norethindrone  5 mg Oral BID  . potassium chloride  40 mEq Oral Daily  . potassium chloride  40 mEq Oral Once  . pravastatin  40 mg Oral q1800  . sodium chloride flush  3 mL Intravenous Once  . sodium chloride flush  3 mL Intravenous Q12H  . torsemide  40 mg Oral Daily   Continuous Infusions: . sodium chloride    . ferumoxytol    . heparin 1,650 Units/hr (06/06/19 1110)   PRN Meds: sodium chloride, acetaminophen **OR** acetaminophen (TYLENOL) oral liquid 160 mg/5 mL **OR** acetaminophen, senna-docusate, sodium chloride flush   Vital Signs    Vitals:   06/06/19 0017 06/06/19 0356 06/06/19 0608 06/06/19 0739  BP: 122/74 122/67  121/74  Pulse: 96 99  94  Resp: 19 19  18   Temp: 98.3 F (36.8 C) 98.5 F (36.9 C)  98.6 F (37 C)  TempSrc:  Oral  Oral  SpO2: 97% 97%  96%  Weight:   113.3 kg   Height:        Intake/Output Summary (Last 24 hours) at 06/06/2019 1142 Last data filed at 06/05/2019 1300 Gross per 24 hour  Intake 320 ml  Output -  Net 320 ml   Last 3 Weights 06/06/2019 06/05/2019 06/04/2019  Weight (lbs) 249 lb 12.8 oz 272 lb 14.9 oz 281 lb 4.9 oz  Weight (kg) 113.309 kg 123.8 kg 127.6 kg      Telemetry    NSR, on brief PAT, no atrial fibrillation is detected - Personally Reviewed  ECG    No new tracing - Personally Reviewed  Physical Exam  Morbidly obese GEN: No acute distress.   Neck: No JVD Cardiac: RRR, midpeaking ejection murmur RUSB, no change w Valsalva, no diastolic murmurs, rubs, or gallops.  Respiratory: Clear to auscultation bilaterally. GI: Soft, nontender, non-distended  MS: B ankle and calf edema, L>R with redness and warmth; No  deformity. Neuro:  Nonfocal  Psych: Normal affect   Labs    High Sensitivity Troponin:  No results for input(s): TROPONINIHS in the last 720 hours.    Chemistry Recent Labs  Lab 06/03/19 1618 06/03/19 1633 06/05/19 0342 06/06/19 0518  NA 135 137 135 132*  K 3.8 3.9 3.1* 3.2*  CL 103 104 98 93*  CO2 23  --  27 26  GLUCOSE 184* 178* 117* 128*  BUN 11 11 9 10   CREATININE 0.96 0.90 1.06* 0.98  CALCIUM 9.0  --  8.4* 8.3*  PROT 7.2  --   --   --   ALBUMIN 2.9*  --   --   --   AST 23  --   --   --   ALT 18  --   --   --   ALKPHOS 41  --   --   --   BILITOT 0.4  --   --   --   GFRNONAA >60  --  55* >60  GFRAA >60  --  >60 >60  ANIONGAP 9  --  10 13     Hematology Recent Labs  Lab 06/03/19 1618 06/03/19 1633 06/05/19 0342 06/06/19 0518  WBC  11.9*  --  15.1* 15.6*  RBC 4.10  --  4.21 4.43  HGB 8.4* 10.2* 8.6* 9.1*  HCT 31.6* 30.0* 31.6* 32.5*  MCV 77.1*  --  75.1* 73.4*  MCH 20.5*  --  20.4* 20.5*  MCHC 26.6*  --  27.2* 28.0*  RDW 19.1*  --  19.4* 19.4*  PLT 344  --  371 413*    BNPNo results for input(s): BNP, PROBNP in the last 168 hours.   DDimer No results for input(s): DDIMER in the last 168 hours.   Radiology    No results found.  Cardiac Studies   Echocardiogram 06/04/2019 1. Left ventricular ejection fraction, by visual estimation, is >75%. The left ventricle has hyperdynamic function. Normal left ventricular size. There is mildly increased left ventricular hypertrophy. 2. Left ventricular diastolic Doppler parameters are consistent with pseudonormalization pattern of LV diastolic filling. 3. Global right ventricle has normal systolic function.The right ventricular size is normal. No increase in right ventricular wall thickness. 4. Left atrial size was mildly dilated. 5. Mild to moderate mitral annular calcification. 6. The tricuspid valve is grossly normal. Tricuspid valve regurgitation moderate-severe. 7. The aortic valve was not well  visualized 8. AV is thickened, calcified with restricted motion. Peak and mean gradients through the valve are 43 and 25 mm Hg respectively consistent with mild to moderate AS. Since echo in July 2020, [place] in mean gradient.  TEE 07/01/2018 - Left ventricle: There was severe focal basal hypertrophy of the septum. Systolic function was vigorous. The estimated ejection fraction was in the range of 65% to 70%. Dynamic obstruction cannot be excluded. Wall motion was normal; there were no regional wall motion abnormalities. The outflow tract showed muscular obstruction with a peak resting gradient of 52 mmHg. - Aortic valve: A prosthesis was present and functioning normally. The prosthesis had a normal range of motion. The sewing ring appeared normal, had no rocking motion, and showed no evidence of dehiscence. - Mitral valve: There was mild regurgitation. - Left atrium: No evidence of thrombus in the atrial cavity or appendage. - Right ventricle: Systolic function was normal. - Right atrium: No evidence of thrombus in the atrial cavity or appendage. - Atrial septum: There was a patent foramen ovale. - Tricuspid valve: There was trivial regurgitation.  Impressions:  - Images show normal function of bioprosthetic aortic valve. Leaflets not fully visualized but appear to open well. Color doppler shows open flow into valve orifice.  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 Due to sedation, provocation maneuvers such as valsalva were unable to be performed.  TEE suggests normal aortic valve function with LVOT gradient.  Cardiac catheterization 04/01/2019 IMPRESSION:[name] has normal coronary arteries and mildly elevated filling pressures. I do not think her chest pain is ischemically mediated. Her enzymes are low and flat. It could be related to demand ischemia.  Continue medical therapy will be recommended. The sheaths were removed and a TR band was placed on the right wrist to achieve patent hemostasis. The patient left lab in stable condition. The findings were reviewed with Dr. Dorris Carnes.  Patient Profile     63 y.o. 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  Mild ascending aortic aneurysm (4.7 cm, stable in size), no coronary artery disease. Cellulitis L leg.  Assessment &  Plan    1. AS of bioprosthetic valve: Technical quality of CT provided limited assessment of the aortic valve biological prosthesis.  For TEE tomorrow (Dr. Harrell Gave, also did her previous TEE in October 2019). This procedure has been fully reviewed with the patient and written informed consent has been obtained. TEE to try to separate the relative contribution of subvalvular obstruction due to basal septal hypertrophy/hyperdynamic left ventricular contraction versus premature deterioration of the bioprosthesis versus prosthetic valve leaflet thrombosis. Regardless of findings, she would probably benefit from a course of full anticoagulation with warfarin for about 3 months and then reevaluation of the aortic valve gradients.  If gradients remain elevated, consider TAVR. 2. Asc Ao aneurysm: Relatively mild and stable in size, unlikely to change decision regarding medical management/TAVR/surgical AVR. 3. TIA: This could be secondary to bioprosthetic aortic valve leaflet thrombosis, but could also be secondary to as yet known diagnosed atrial fibrillation.  Extremely frequent PACs and brief runs of atrial tachycardia on telemetry.  She needs a minimum of 30-day event monitor at discharge, might even consider an implantable loop recorder. 4.  Chronic microcytic anemia: ongoing dysfunctional/postmenopausal  bleeding. Etiology not yet defined.  This would have implications surrounding chronic anticoagulation. Anemia exaggerates the aortic outflow gradients. 5.  Morbid obesity: I am sure this is contributing to higher gradients across the left ventricular outflow tract and is part of the reason for her dyspnea. 6. LVOT obstruction: Prefer beta-blockers for treatment of hypertension, avoid excessive diuretics and avoid potent vasodilators. In/out and weight are unreliable. Creatinine normal, but Na lower, will hold the diuretic if trend worsens tomorrow.     For questions or updates, please contact [place] Please consult [email or web address] for contact info under        Signed, Sanda Klein, MD  06/06/2019, 11:42 AM

## 2019-06-07 ENCOUNTER — Encounter (HOSPITAL_COMMUNITY): Payer: Self-pay

## 2019-06-07 ENCOUNTER — Inpatient Hospital Stay (HOSPITAL_COMMUNITY): Payer: BC Managed Care – PPO | Admitting: Certified Registered"

## 2019-06-07 ENCOUNTER — Ambulatory Visit: Payer: BC Managed Care – PPO | Admitting: Cardiology

## 2019-06-07 ENCOUNTER — Inpatient Hospital Stay (HOSPITAL_COMMUNITY): Payer: BC Managed Care – PPO

## 2019-06-07 ENCOUNTER — Encounter (HOSPITAL_COMMUNITY)
Admission: EM | Disposition: A | Discharge status: Home / Self Care | Payer: Self-pay | Source: Home / Self Care | Attending: Internal Medicine

## 2019-06-07 DIAGNOSIS — I340 Nonrheumatic mitral (valve) insufficiency: Secondary | ICD-10-CM

## 2019-06-07 DIAGNOSIS — I361 Nonrheumatic tricuspid (valve) insufficiency: Secondary | ICD-10-CM

## 2019-06-07 DIAGNOSIS — I350 Nonrheumatic aortic (valve) stenosis: Secondary | ICD-10-CM

## 2019-06-07 HISTORY — PX: BUBBLE STUDY: SHX6837

## 2019-06-07 HISTORY — PX: TEE WITHOUT CARDIOVERSION: SHX5443

## 2019-06-07 LAB — CBC
HCT: 31.1 % — ABNORMAL LOW (ref 36.0–46.0)
Hemoglobin: 9 g/dL — ABNORMAL LOW (ref 12.0–15.0)
MCH: 21.3 pg — ABNORMAL LOW (ref 26.0–34.0)
MCHC: 28.9 g/dL — ABNORMAL LOW (ref 30.0–36.0)
MCV: 73.7 fL — ABNORMAL LOW (ref 80.0–100.0)
Platelets: 375 10*3/uL (ref 150–400)
RBC: 4.22 MIL/uL (ref 3.87–5.11)
RDW: 19.1 % — ABNORMAL HIGH (ref 11.5–15.5)
WBC: 14.8 10*3/uL — ABNORMAL HIGH (ref 4.0–10.5)
nRBC: 0 % (ref 0.0–0.2)

## 2019-06-07 LAB — BASIC METABOLIC PANEL
Anion gap: 10 (ref 5–15)
BUN: 10 mg/dL (ref 8–23)
CO2: 28 mmol/L (ref 22–32)
Calcium: 8.5 mg/dL — ABNORMAL LOW (ref 8.9–10.3)
Chloride: 97 mmol/L — ABNORMAL LOW (ref 98–111)
Creatinine, Ser: 1.07 mg/dL — ABNORMAL HIGH (ref 0.44–1.00)
GFR calc Af Amer: >60 mL/min (ref >60)
GFR calc non Af Amer: 55 mL/min — ABNORMAL LOW (ref >60)
Glucose, Bld: 194 mg/dL — ABNORMAL HIGH (ref 70–99)
Potassium: 4.1 mmol/L (ref 3.5–5.1)
Sodium: 135 mmol/L (ref 135–145)

## 2019-06-07 LAB — FOLATE RBC
Folate, Hemolysate: 432 ng/mL
Folate, RBC: 1350 ng/mL (ref >498)
Hematocrit: 32 % — ABNORMAL LOW (ref 34.0–46.6)

## 2019-06-07 LAB — HEPARIN LEVEL (UNFRACTIONATED): Heparin Unfractionated: 0.34 IU/mL (ref 0.30–0.70)

## 2019-06-07 LAB — T4, FREE: Free T4: 0.96 ng/dL (ref 0.61–1.12)

## 2019-06-07 SURGERY — ECHOCARDIOGRAM, TRANSESOPHAGEAL
Anesthesia: Monitor Anesthesia Care

## 2019-06-07 MED ORDER — WARFARIN - PHARMACIST DOSING INPATIENT
Freq: Every day | Status: DC
  Administered 2019-06-08: 18:00:00

## 2019-06-07 MED ORDER — SODIUM CHLORIDE 0.9 % IV SOLN
INTRAVENOUS | Status: DC
  Administered 2019-06-07: 08:00:00 via INTRAVENOUS

## 2019-06-07 MED ORDER — PROPOFOL 10 MG/ML IV BOLUS
INTRAVENOUS | Status: DC | PRN
  Administered 2019-06-07 (×2): 50 mg via INTRAVENOUS

## 2019-06-07 MED ORDER — WARFARIN SODIUM 5 MG PO TABS
7.5000 mg | ORAL_TABLET | Freq: Once | ORAL | Status: AC
  Administered 2019-06-07: 7.5 mg via ORAL
  Filled 2019-06-07: qty 1.5

## 2019-06-07 MED ORDER — CEPHALEXIN 500 MG PO CAPS: 500.0000 mg | ORAL_CAPSULE | Freq: Three times a day (TID) | ORAL | Status: DC

## 2019-06-07 MED ORDER — LIDOCAINE 2% (20 MG/ML) 5 ML SYRINGE
INTRAMUSCULAR | Status: DC | PRN
  Administered 2019-06-07: 40 mg via INTRAVENOUS

## 2019-06-07 MED ORDER — COUMADIN BOOK
Freq: Once | Status: AC
  Administered 2019-06-07: 18:00:00
  Filled 2019-06-07: qty 1

## 2019-06-07 MED ORDER — ENOXAPARIN SODIUM 120 MG/0.8ML ~~LOC~~ SOLN
120.0000 mg | Freq: Once | SUBCUTANEOUS | Status: AC
  Administered 2019-06-07: 120 mg via SUBCUTANEOUS
  Filled 2019-06-07: qty 0.8

## 2019-06-07 MED ORDER — CEFAZOLIN SODIUM-DEXTROSE 2-4 GM/100ML-% IV SOLN
2.0000 g | Freq: Three times a day (TID) | INTRAVENOUS | Status: DC
  Administered 2019-06-07 – 2019-06-09 (×6): 2 g via INTRAVENOUS
  Filled 2019-06-07 (×7): qty 100

## 2019-06-07 MED ORDER — CEFAZOLIN SODIUM-DEXTROSE 2-4 GM/100ML-% IV SOLN
2.0000 g | Freq: Three times a day (TID) | INTRAVENOUS | Status: DC
  Administered 2019-06-07: 2 g via INTRAVENOUS
  Filled 2019-06-07 (×3): qty 100

## 2019-06-07 MED ORDER — SODIUM CHLORIDE 0.9 % IV SOLN
INTRAVENOUS | Status: DC | PRN
  Administered 2019-06-07: 08:00:00 via INTRAVENOUS

## 2019-06-07 MED ORDER — ENOXAPARIN SODIUM 120 MG/0.8ML ~~LOC~~ SOLN
120.0000 mg | Freq: Two times a day (BID) | SUBCUTANEOUS | Status: DC
  Administered 2019-06-08 – 2019-06-09 (×4): 120 mg via SUBCUTANEOUS
  Filled 2019-06-07 (×5): qty 0.8

## 2019-06-07 MED ORDER — PROPOFOL 500 MG/50ML IV EMUL
INTRAVENOUS | Status: DC | PRN
  Administered 2019-06-07: 75 ug/kg/min via INTRAVENOUS

## 2019-06-07 NOTE — Plan of Care (Signed)
Patient is currently consuming her lunch meal. Patient has consumed 50% at this time and is actively eating.

## 2019-06-07 NOTE — Anesthesia Postprocedure Evaluation (Signed)
Anesthesia Post Note  Patient: ANESSIA OAKLAND  Procedure(s) Performed: TRANSESOPHAGEAL ECHOCARDIOGRAM (TEE) (N/A ) BUBBLE STUDY     Patient location during evaluation: Endoscopy Anesthesia Type: MAC Level of consciousness: awake and alert, patient cooperative and oriented Pain management: pain level controlled Vital Signs Assessment: post-procedure vital signs reviewed and stable Respiratory status: spontaneous breathing, nonlabored ventilation, respiratory function stable and patient connected to nasal cannula oxygen Cardiovascular status: blood pressure returned to baseline and stable Postop Assessment: no apparent nausea or vomiting Anesthetic complications: no    Last Vitals:  Vitals:   06/07/19 0738 06/07/19 0910  BP: (!) 131/59 (!) 114/50  Pulse: 85 92  Resp: 17 (!) 22  Temp: 36.8 C 36.8 C  SpO2: 97% 94%    Last Pain:  Vitals:   06/07/19 0910  TempSrc: Temporal  PainSc: 0-No pain                 Gabriella [place]head,E. Carlisia Geno

## 2019-06-07 NOTE — Progress Notes (Signed)
  Echocardiogram Echocardiogram Transesophageal has been performed.  [name] L Androw 06/07/2019, 9:19 AM

## 2019-06-07 NOTE — Progress Notes (Addendum)
ANTICOAGULATION & ANTIBIOTIC CONSULT NOTE  Pharmacy Consult: Heparin + Ancef Indication:  Possible AV leaflet thrombosis + Cellulitis  Allergies  Allergen Reactions  . Vancomycin Itching    Patient Measurements: Height: 5\' 2"  (157.5 cm) Weight: 278 lb (126.1 kg) IBW/kg (Calculated) : 50.1 Heparin Dosing Weight: 82 kg  Vital Signs: Temp: 97.8 F (36.6 C) (09/28 1014) Temp Source: Oral (09/28 1014) BP: 100/53 (09/28 1014) Pulse Rate: 80 (09/28 1014)  Labs: Recent Labs    06/05/19 0342  06/06/19 0518 06/06/19 1359 06/07/19 0357 06/07/19 0524  HGB 8.6*  --  9.1*  --  9.0*  --   HCT 31.6*  --  32.5*  --  31.1*  --   PLT 371  --  413*  --  375  --   HEPARINUNFRC 0.28*   < > 0.52 0.44 0.34  --   CREATININE 1.06*  --  0.98  --   --  1.07*   < > = values in this interval not displayed.    Estimated Creatinine Clearance: 67.5 mL/min (A) (by C-G formula based on SCr of 1.07 mg/dL (H)).   Assessment: 87 YOF presented with speech difficulty, numbness and weakness.  Pharmacy consulted to dose IV heparin for possible AV leaflet thrombosis.  TEE today 06/07/19 negative for thrombus and positive for small PFO.  Heparin level is therapeutic.  Patient has known uterine bleeding which is stable. Iron studies show low stores - receiving Feraheme for anemia.   Pharmacy also consulted to dose Ancef for cellulitis.  She didn't tolerate vancomycin.  Renal function is stable, afebrile, WBC improving.  Goal of Therapy:  Heparin level 0.3 - 0.5 units/ml given TIA Monitor platelets by anticoagulation protocol: Yes  Resolution of cellulitis   Plan:  Continue heparin gtt at 1650 units/hr Daily heparin level and CBC F/U AC plans  Ancef 2gm IV Q8H Pharmacy will sign off and follow peripherally.  Thank you for the consult!  [name], PharmD, BCPS, [place] 06/07/2019, 11:04 AM  =============================  Addendum: Switch IV heparin to Lovenox and start Coumadin per  Cards Indication: bioprosthetic AVR with TIA and rule out Afib Renal clearance > 30 ml/min Baseline INR 1.1  Plan: Stop heparin gtt at 1330, then at 1430 start Lovenox 120mg  SQ Q12H (RN aware of timing) Coumadin 7.5mg  PO today Daily PT / INR CBC Q72H while on Lovenox  [name], PharmD, BCPS, [place] 06/07/2019, 1:19 PM

## 2019-06-07 NOTE — Progress Notes (Signed)
Dr. Silas [place] informed of 2.02 second pause with 2nd degree block type 2. Report received form telemetry at this time.

## 2019-06-07 NOTE — Progress Notes (Addendum)
PROGRESS NOTE  [name] [id number] DOB: 1955-11-13 DOA: 06/03/2019 PCP: [name], MD  HPI/Recap of past 24 hours: [name] is an 63 y.o. female with medical history significant forlymphedema involving the bilateral lower extremities, hypertension, chronic diastolic CHF, aortic stenosis status post bioprosthetic valve replacement, chronic renal insufficiency, and chronic anemia attributed to dysfunctional uterine bleeding, now presenting to the emergency department after an episode of transient speech difficulty and left upper extremity numbness and weakness.  Intermittent uterine bleeding on hormonal therapy  Hospital course complicated by adverse reaction to IV vancomycin with severe pruritus resolved after IV Benadryl, IV Solu-Medrol and IV Pepcid.  06/07/19: Patient was seen and examined at her bedside, her husband present in the room.  Reports he feels better after treatment as stated above.  She denies any chest pain at rest.  Admits to worsening tenderness of left lower extremity cellulitis.  Will start Ancef.     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 Small PFO On heparin drip per cardiology   Small PFO as noted on TEE Right-sided pressure mild to moderately elevated  cardiology following  Worsening left lower extremity cellulitis Not improving on Keflex p.o. which she received from 06/04/19 to 06/06/2019 Persistent erythema, edema, warmth and worsening tenderness with mild touch -Adverse reaction to IV vancomycin, stopped (9/27-9/27/20) -Leukocytosis is persistent -Start Ancef on 06/07/2019 Monitor fever curve and WBC  Possible adverse reaction to IV vancomycin On start on 06/06/2019 Treated with IV Benadryl 25 mg 3 times daily x1 day, IV Solu-Medrol 60 mg 1, IV Pepcid. Symptoms have now resolved.  2.02-second pause reported overnight Obtain twelve-lead EKG Cardiology following  Subclinical hypothyroidism TSH 5.5 Free T4 normal  Ascending aortic aneurysm Mild and stable SBP goal 120  LVOT obstruction Per cardiology avoid excessive diuretics and avoid potential vasodilators On torsemide 40 mg daily Defer to cardiology.  His cardiac medications  Lymphedema complicated by LLE cellulitis Management as noted above  Persistent leukocytosis, likely in the setting of cellulitis versus reactive MRSA screen negative  Chronic diastolic CHF Appears hypervolemic On torsemide 40 mg daily Continue strict I's and O's and daily weight Monitor urine output, blood pressure, electrolytes and renal function.  Resolved hypokalemia in the setting of ongoing diuresing, post repletion Potassium 3.1>> 3.2 on 06/06/2019 Potassium 4.1 on 06/07/2019 Repleted with KCl p.o. 40 mg x 2 doses  Resolved euvolemic hyponatremia Sodium 132 from 135 yesterday Euvolemic on exam Sodium 135 on 06/07/2019  Intermittent uterine bleeding in the setting of post menopausal bleeding diagnosed a year ago Will need to follow-up with gynecology outpatient Hemoglobin stable at 9.0 Norethandrolone  Severe iron deficiency anemia Iron studies revealed trans sat 4, iron 21, ferritin 11 Hemoglobin 9.1 on  06/06/2019 with MCV  73 Transfused 1 dose of IV Feraheme on 510 mg 1 Repeat CBC in the morning  Morbid obesity BMI 49 Recommend weight loss outpatient with regular physical activity and healthy dieting  Physical debility/ambulatory dysfunction PT assessed and recommended outpatient PT Fall precautions   DVT prophylaxis:  Heparin drip Code Status: Full Code.  Family Communication:   Antibiotics: keflex 06/04/19>>06/06/19. IV vancomycin 06/06/19>> 06/06/2019 Ancef 06/07/2019>>  Disposition Plan:  Patient is currently not appropriate for discharge due to ongoing evaluation of present condition.  She has a planned TEE on Monday per cardiology to assess bioprosthetic valve.  We will discharge to home when cardiology signed off.   Objective: Vitals:   06/07/19 0920 06/07/19 0930 06/07/19 0940 06/07/19 1014  BP: (!) 112/50 (!) 116/46 (!) 119/41 (!) 100/53  Pulse: 88 87  80  Resp: (!) 21 19 (!) 22 16  Temp:    97.8 F (36.6 C)  TempSrc:    Oral  SpO2: 97% 95% 100% 96%  Weight:      Height:        Intake/Output Summary (Last 24 hours) at 06/07/2019 1018 Last data filed at 06/07/2019 0910 Gross per 24 hour  Intake 750 ml  Output -  Net 750 ml   Filed Weights   06/05/19 0500 06/06/19 0608 06/07/19 0405  Weight: 123.8 kg 113.3 kg 126.1 kg    Exam:  . General: 63 y.o. year-old female obese no acute distress.  Alert oriented x3. . Cardiovascular: Regular rate and rhythm no rubs or gallops no JVD or thyromegaly.   Respiratory: Clear to auscultation no wheezes or rales.  Poor inspiratory effort.   . Abdomen: Obese nontender nondistended normal bowel sounds present. . Musculoskeletal: Edema left lower extremity.  Warmth, erythema and tenderness with mild palpation.  Marland Kitchen  Psychiatry: Mood is appropriate for condition and setting.   Data Reviewed: CBC: Recent Labs  Lab 06/03/19 1618 06/03/19 1633 06/05/19 0342 06/06/19 0518 06/07/19 0357  WBC 11.9*  --  15.1* 15.6* 14.8*   NEUTROABS 8.7*  --   --   --   --   HGB 8.4* 10.2* 8.6* 9.1* 9.0*  HCT 31.6* 30.0* 31.6* 32.5* 31.1*  MCV 77.1*  --  75.1* 73.4* 73.7*  PLT 344  --  371 413* [id number]   Basic Metabolic Panel: Recent Labs  Lab 06/03/19 1618 06/03/19 1633 06/05/19 0342 06/06/19 0518 06/07/19 0524  NA 135 137 135 132* 135  K 3.8 3.9 3.1* 3.2* 4.1  CL 103 104 98 93* 97*  CO2 23  --  27 26 28   GLUCOSE 184* 178* 117* 128* 194*  BUN 11 11 9 10 10   CREATININE 0.96 0.90 1.06* 0.98 1.07*  CALCIUM 9.0  --  8.4* 8.3* 8.5*  MG  --   --   --  2.1  --    GFR: Estimated Creatinine Clearance: 67.5 mL/min (A) (by C-G formula based on SCr of 1.07 mg/dL (H)). Liver Function Tests: Recent Labs  Lab 06/03/19 1618  AST 23  ALT 18  ALKPHOS 41  BILITOT 0.4  PROT 7.2  ALBUMIN 2.9*   No results for input(s): LIPASE, AMYLASE in the last 168 hours. No results for input(s): AMMONIA in the last 168 hours. Coagulation Profile: Recent Labs  Lab 06/03/19 1618  INR 1.1   Cardiac Enzymes: No results for input(s): CKTOTAL, CKMB, CKMBINDEX, TROPONINI in the last 168 hours. BNP (last 3 results) Recent Labs    06/26/18 1352 08/14/18 1058 02/12/19 1116  PROBNP 698* 876* 640*   HbA1C: No results for input(s): HGBA1C in the last 72 hours. CBG: Recent Labs  Lab 06/04/19 0105  GLUCAP 125*   Lipid Profile: No results for input(s): CHOL, HDL, LDLCALC, TRIG, CHOLHDL, LDLDIRECT in the last 72 hours. Thyroid Function Tests: Recent Labs    06/05/19 1650 06/07/19 0524  TSH 5.585*  --   FREET4  --  0.96   Anemia Panel: Recent Labs    06/05/19 1650  VITAMINB12 222  FERRITIN 11  TIBC 508*  IRON 21*   Urine analysis: No results found for: COLORURINE, APPEARANCEUR, LABSPEC, PHURINE, GLUCOSEU, HGBUR, BILIRUBINUR, KETONESUR, PROTEINUR, UROBILINOGEN, NITRITE, LEUKOCYTESUR Sepsis Labs: @LABRCNTIP (procalcitonin:4,lacticidven:4)  ) Recent Results (from the past 240 hour(s))  SARS CORONAVIRUS 2 (TAT 6-24 HRS)  Nasopharyngeal Nasopharyngeal Swab     Status: None   Collection Time: 06/03/19 10:31 PM   Specimen: Nasopharyngeal Swab  Result Value Ref Range Status   SARS Coronavirus 2 NEGATIVE NEGATIVE Final    Comment: (NOTE) SARS-CoV-2 target nucleic acids are NOT DETECTED. The SARS-CoV-2 RNA is generally detectable in upper and lower respiratory specimens during the acute phase of infection. Negative results do not preclude SARS-CoV-2 infection, do not rule out co-infections with other pathogens, and should not be used as the sole basis for treatment or other patient management decisions. Negative results must be combined with clinical observations, patient history, and epidemiological information. The expected result is Negative. Fact Sheet for Patients: SugarRoll.be Fact Sheet for Healthcare Providers: https://www.woods-mathews.com/ This test is not yet approved or cleared by the [place] FDA and  has been authorized for detection and/or diagnosis of SARS-CoV-2 by FDA under an Emergency Use Authorization (EUA). This EUA will remain  in effect (meaning this test can be used) for the duration of the COVID-19 declaration under Section 56 4(b)(1) of the Act, 21 U.S.C. section 360bbb-3(b)(1), unless the authorization is terminated or revoked sooner. Performed at [facility], [place] [address] [place], [place] [address]   MRSA PCR Screening     Status: None   Collection Time: 06/06/19 11:05 AM   Specimen: Nasal Mucosa; Nasopharyngeal  Result Value Ref Range Status   MRSA by PCR NEGATIVE NEGATIVE Final    Comment:        The GeneXpert MRSA Assay (FDA approved for NASAL specimens only), is one component of a comprehensive MRSA colonization surveillance program. It is not intended to diagnose MRSA infection nor to guide or monitor treatment for MRSA infections. Performed at [facility], [place] [address], [place], [place] [address]        Studies: No results found.  Scheduled Meds: . aspirin EC  81 mg Oral Daily  . cephALEXin  500 mg Oral Q8H  . diphenhydrAMINE  25 mg Intravenous TID  . metoprolol tartrate  12.5 mg Oral BID  . norethindrone  5 mg Oral BID  . potassium chloride  40 mEq Oral Daily  . pravastatin  40 mg Oral q1800  . sodium chloride flush  3 mL Intravenous Once  . sodium chloride flush  3 mL Intravenous Q12H  . torsemide  40 mg Oral Daily    Continuous Infusions: . sodium chloride    . heparin 1,650 Units/hr (06/07/19 0829)     LOS: 3 days     Kayleen Memos, MD [facility] Pager [phone]  If 7PM-7AM, please contact night-coverage [email or web address] Password [facility] 06/07/2019, 10:18 AM

## 2019-06-07 NOTE — Anesthesia Procedure Notes (Signed)
Procedure Name: MAC Date/Time: 06/07/2019 8:48 AM Performed by: Orlie Dakin, CRNA Pre-anesthesia Checklist: Patient identified, Suction available, Emergency Drugs available, Patient being monitored and Timeout performed Patient Re-evaluated:Patient Re-evaluated prior to induction Oxygen Delivery Method: Nasal cannula Preoxygenation: Pre-oxygenation with 100% oxygen Induction Type: IV induction

## 2019-06-07 NOTE — Anesthesia Preprocedure Evaluation (Addendum)
Anesthesia Evaluation  Patient identified by MRN, date of birth, ID band Patient awake    Reviewed: Allergy & Precautions, NPO status , Patient's Chart, lab work & pertinent test results, reviewed documented beta blocker date and time   History of Anesthesia Complications Negative for: history of anesthetic complications  Airway Mallampati: IV  TM Distance: >3 FB Neck ROM: Full    Dental  (+) Dental Advisory Given, Caps   Pulmonary shortness of breath, sleep apnea (BiPAP) ,  06/03/2019 SARS coronavirus NEG   breath sounds clear to auscultation       Cardiovascular hypertension, Pt. on medications and Pt. on home beta blockers (-) angina(-) CAD + Valvular Problems/Murmurs (s/p AVR)  Rhythm:Regular Rate:Normal  06/04/2019 ECHO: EF 75%, mod AS with gradients peak 43 and mean 25 mmHg, mod-severe TR   Neuro/Psych TIA   GI/Hepatic negative GI ROS, Neg liver ROS,   Endo/Other  Morbid obesity  Renal/GU negative Renal ROS     Musculoskeletal   Abdominal (+) + obese,   Peds  Hematology  (+) Blood dyscrasia (Hb 9.0), anemia ,   Anesthesia Other Findings   Reproductive/Obstetrics                            Anesthesia Physical Anesthesia Plan  ASA: III  Anesthesia Plan: MAC   Post-op Pain Management:    Induction: Intravenous  PONV Risk Score and Plan: 3 and Treatment may vary due to age or medical condition  Airway Management Planned: Natural Airway and Nasal Cannula  Additional Equipment:   Intra-op Plan:   Post-operative Plan:   Informed Consent: I have reviewed the patients History and Physical, chart, labs and discussed the procedure including the risks, benefits and alternatives for the proposed anesthesia with the patient or authorized representative who has indicated his/her understanding and acceptance.     Dental advisory given  Plan Discussed with: CRNA and  Surgeon  Anesthesia Plan Comments:        Anesthesia Quick Evaluation

## 2019-06-07 NOTE — Transfer of Care (Signed)
Immediate Anesthesia Transfer of Care Note  Patient: [name]  Procedure(s) Performed: TRANSESOPHAGEAL ECHOCARDIOGRAM (TEE) (N/A ) BUBBLE STUDY  Patient Location: Endoscopy Unit  Anesthesia Type:MAC  Level of Consciousness: awake and patient cooperative  Airway & Oxygen Therapy: Patient Spontanous Breathing and Patient connected to nasal cannula oxygen  Post-op Assessment: Report given to RN and Post -op Vital signs reviewed and stable  Post vital signs: Reviewed and stable  Last Vitals:  Vitals Value Taken Time  BP    Temp    Pulse    Resp    SpO2      Last Pain:  Vitals:   06/07/19 0738  TempSrc: Oral  PainSc: 0-No pain         Complications: No apparent anesthesia complications

## 2019-06-07 NOTE — Interval H&P Note (Signed)
History and Physical Interval Note:  06/07/2019 7:43 AM  [name]  has presented today for surgery, with the diagnosis of AORTIC VALVE THROMBUS.  The various methods of treatment have been discussed with the patient and family. After consideration of risks, benefits and other options for treatment, the patient has consented to  Procedure(s): TRANSESOPHAGEAL ECHOCARDIOGRAM (TEE) (N/A) as a surgical intervention.  The patient's history has been reviewed, patient examined, no change in status, stable for surgery.  I have reviewed the patient's chart and labs.  Questions were answered to the patient's satisfaction.     Pavan Bring Harrell Gave

## 2019-06-07 NOTE — Progress Notes (Addendum)
Dr. Gardiner Rhyme requested that we go ahead and set up Coumadin f/u later this week. I spoke with [facility] who only does Coumadin on Tuesdays. I spoke with the pt who is willing to travel to [facility] for first check. Our office backline was busy so I sent a staff message to scheduling requesting Coumadin new start appt for 10/1 and asked they reply back. Will plan to add to chart when we have this info.  Addendum: Coumadin clinic visit @ [facility] scheduled for 10/1 @ 2:30pm  [employer] PA-C

## 2019-06-07 NOTE — Discharge Instructions (Addendum)
Transient Ischemic Attack  A transient ischemic attack (TIA) is a "warning stroke" that causes stroke-like symptoms that go away quickly. A TIA does not cause lasting damage to the brain. But having a TIA is a sign that you may be at risk for a stroke. Lifestyle changes and medical treatments can help prevent a stroke. It is important to know the symptoms of a TIA and what to do. Get help right away, even if your symptoms go away. The symptoms of a TIA are the same as those of a stroke. They can happen fast, and they usually go away within minutes or hours. They can include:  Weakness or loss of feeling in your face, arm, or leg. This often happens on one side of your body.  Trouble walking.  Trouble moving your arms or legs.  Trouble talking or understanding what people are saying.  Trouble seeing.  Seeing two of one object (double vision).  Feeling dizzy.  Feeling confused.  Loss of balance or coordination.  Feeling sick to your stomach (nauseous) and throwing up (vomiting).  A very bad headache for no reason. What increases the risk? Certain things may make you more likely to have a TIA. Some of these are things that you can change, such as:  Being very overweight (obese).  Using products that contain nicotine or tobacco, such as cigarettes and e-cigarettes.  Taking birth control pills.  Not being active.  Drinking too much alcohol.  Using drugs. Other risk factors include:  Having an irregular heartbeat (atrial fibrillation).  Being African American or Hispanic.  Having had blood clots, stroke, TIA, or heart attack in the past.  Being a woman with a history of high blood pressure in pregnancy (preeclampsia).  Being over the age of 84.  Being female.  Having family history of stroke.  Having the following diseases or conditions: ? High blood pressure. ? High cholesterol. ? Diabetes. ? Heart disease. ? Sickle cell disease. ? Sleep apnea. ? Migraine  headache. ? Long-term (chronic) diseases that cause soreness and swelling (inflammation). ? Disorders that affect how your blood clots. Follow these instructions at home: Medicines   Take over-the-counter and prescription medicines only as told by your doctor.  If you were told to take aspirin or another medicine to thin your blood, take it exactly as told by your doctor. ? Taking too much of the medicine can cause bleeding. ? Taking too little of the medicine may not work to treat the problem. Eating and drinking   Eat 5 or more servings of fruits and vegetables each day.  Follow instructions from your doctor about your diet. You may need to follow a certain diet to help lower your risk of having a stroke. You may need to: ? Eat a diet that is low in fat and salt. ? Eat foods that contain a lot of fiber. ? Limit the amount of carbohydrates and sugar in your diet.  Limit alcohol intake to 1 drink a day for nonpregnant women and 2 drinks a day for men. One drink equals 12 oz of beer, 5 oz of wine, or 1 oz of hard liquor. General instructions  Keep a healthy weight.  Stay active. Try to get at least 30 minutes of activity on all or most days.  Find out if you have a condition called sleep apnea. Get treatment if needed.  Do not use any products that contain nicotine or tobacco, such as cigarettes and e-cigarettes. If you need help  quitting, ask your doctor.  Do not abuse drugs.  Keep all follow-up visits as told by your doctor. This is important. Get help right away if:  You have any signs of stroke. "BE FAST" is an easy way to remember the main warning signs: ? B - Balance. Signs are dizziness, sudden trouble walking, or loss of balance. ? E - Eyes. Signs are trouble seeing or a sudden change in how you see. ? F - Face. Signs are sudden weakness or loss of feeling of the face, or the face or eyelid drooping on one side. ? A - Arms. Signs are weakness or loss of feeling in an  arm. This happens suddenly and usually on one side of the body. ? S - Speech. Signs are sudden trouble speaking, slurred speech, or trouble understanding what people say. ? T - Time. Time to call emergency services. Write down what time symptoms started.  You have other signs of stroke, such as: ? A sudden, very bad headache with no known cause. ? Feeling sick to your stomach (nausea). ? Throwing up (vomiting). ? Jerky movements that you cannot control (seizure). These symptoms may be an emergency. Do not wait to see if the symptoms will go away. Get medical help right away. Call your local emergency services (911 in the [place] Do not drive yourself to the hospital. Summary  A transient ischemic attack (TIA) is a "warning stroke" that causes stroke-like symptoms that go away quickly.  A TIA is a medical emergency. Get help right away, even if your symptoms go away.  A TIA does not cause lasting damage to the brain.  Having a TIA is a sign that you may be at risk for a stroke. Lifestyle changes and medical treatments can help prevent a stroke. This information is not intended to replace advice given to you by your health care provider. Make sure you discuss any questions you have with your health care provider. Document Released: 06/04/2008 Document Revised: 05/22/2018 Document Reviewed: 11/27/2016 Elsevier Patient Education  2020 [place] on my medicine - Coumadin   (Warfarin)  This medication education was reviewed with me or my healthcare representative as part of my discharge preparation.  Why was Coumadin prescribed for you? Coumadin was prescribed for you because you have a blood clot or a medical condition that can cause an increased risk of forming blood clots. Blood clots can cause serious health problems by blocking the flow of blood to the heart, lung, or brain. Coumadin can prevent harmful blood clots from forming. As a reminder your indication for Coumadin  is:   Select from menu rule out Afib, +TIA  What test will check on my response to Coumadin? While on Coumadin (warfarin) you will need to have an INR test regularly to ensure that your dose is keeping you in the desired range. The INR (international normalized ratio) number is calculated from the result of the laboratory test called prothrombin time (PT).  If an INR APPOINTMENT HAS NOT ALREADY BEEN MADE FOR YOU please schedule an appointment to have this lab work done by your health care provider within 7 days. Your INR goal is usually a number between:  2 to 3 or your provider may give you a more narrow range like 2-2.5.  Ask your health care provider during an office visit what your goal INR is.  What  do you need to  know  About  COUMADIN? Take Coumadin (warfarin) exactly as prescribed by your  healthcare provider about the same time each day.  DO NOT stop taking without talking to the doctor who prescribed the medication.  Stopping without other blood clot prevention medication to take the place of Coumadin may increase your risk of developing a new clot or stroke.  Get refills before you run out.  What do you do if you miss a dose? If you miss a dose, take it as soon as you remember on the same day then continue your regularly scheduled regimen the next day.  Do not take two doses of Coumadin at the same time.  Important Safety Information A possible side effect of Coumadin (Warfarin) is an increased risk of bleeding. You should call your healthcare provider right away if you experience any of the following: ? Bleeding from an injury or your nose that does not stop. ? Unusual colored urine (red or dark brown) or unusual colored stools (red or black). ? Unusual bruising for unknown reasons. ? A serious fall or if you hit your head (even if there is no bleeding).  Some foods or medicines interact with Coumadin (warfarin) and might alter your response to warfarin. To help avoid this: ? Eat a  balanced diet, maintaining a consistent amount of Vitamin K. ? Notify your provider about major diet changes you plan to make. ? Avoid alcohol or limit your intake to 1 drink for women and 2 drinks for men per day. (1 drink is 5 oz. wine, 12 oz. beer, or 1.5 oz. liquor.)  Make sure that ANY health care provider who prescribes medication for you knows that you are taking Coumadin (warfarin).  Also make sure the healthcare provider who is monitoring your Coumadin knows when you have started a new medication including herbals and non-prescription products.  Coumadin (Warfarin)  Major Drug Interactions  Increased Warfarin Effect Decreased Warfarin Effect  Alcohol (large quantities) Antibiotics (esp. Septra/Bactrim, Flagyl, Cipro) Amiodarone (Cordarone) Aspirin (ASA) Cimetidine (Tagamet) Megestrol (Megace) NSAIDs (ibuprofen, naproxen, etc.) Piroxicam (Feldene) Propafenone (Rythmol SR) Propranolol (Inderal) Isoniazid (INH) Posaconazole (Noxafil) Barbiturates (Phenobarbital) Carbamazepine (Tegretol) Chlordiazepoxide (Librium) Cholestyramine (Questran) Griseofulvin Oral Contraceptives Rifampin Sucralfate (Carafate) Vitamin K   Coumadin (Warfarin) Major Herbal Interactions  Increased Warfarin Effect Decreased Warfarin Effect  Garlic Ginseng Ginkgo biloba Coenzyme Q10 Green tea St. Johns wort    Coumadin (Warfarin) FOOD Interactions  Eat a consistent number of servings per week of foods HIGH in Vitamin K (1 serving =  cup)  Collards (cooked, or boiled & drained) Kale (cooked, or boiled & drained) Mustard greens (cooked, or boiled & drained) Parsley *serving size only =  cup Spinach (cooked, or boiled & drained) Swiss chard (cooked, or boiled & drained) Turnip greens (cooked, or boiled & drained)  Eat a consistent number of servings per week of foods MEDIUM-HIGH in Vitamin K (1 serving = 1 cup)  Asparagus (cooked, or boiled & drained) Broccoli (cooked, boiled & drained,  or raw & chopped) Brussel sprouts (cooked, or boiled & drained) *serving size only =  cup Lettuce, raw (green leaf, endive, romaine) Spinach, raw Turnip greens, raw & chopped   These websites have more information on Coumadin (warfarin):  FailFactory.se; VeganReport.com.au;

## 2019-06-07 NOTE — CV Procedure (Signed)
    TRANSESOPHAGEAL ECHOCARDIOGRAM   NAME:  KIYONA MCNALL   MRN: [id number] DOB:  [birth year]   ADMIT DATE: 06/03/2019  INDICATIONS: TIA, concern for bioprosthetic valve involvement  PROCEDURE:   Informed consent was obtained prior to the procedure. The risks, benefits and alternatives for the procedure were discussed and the patient comprehended these risks.  Risks include, but are not limited to, cough, sore throat, vomiting, nausea, somnolence, esophageal and stomach trauma or perforation, bleeding, low blood pressure, aspiration, pneumonia, infection, trauma to the teeth and death.    Procedural time out performed. She received propofol under the monitoring of Dr. Glennon Mac in anesthesia.   The transesophageal probe was inserted in the esophagus and stomach without difficulty and multiple views were obtained.    COMPLICATIONS:    There were no immediate complications.  FINDINGS:  LEFT VENTRICLE: EF = >65%. Hyperdynamic. No regional wall motion abnormalities.  RIGHT VENTRICLE: Normal size and function.   LEFT ATRIUM: No thrombus/mass.  LEFT ATRIAL APPENDAGE: No thrombus/mass.   RIGHT ATRIUM: No thrombus/mass.  AORTIC VALVE:  Bioprosthetic aortic valve appears well seated. Trileaflet. No regurgitation. No vegetation.Leaflets appear to open well. No evidence of significant stenosis. Gradients with anesthesia slightly lower than on TTE but in mild range.  MITRAL VALVE:    Normal structure with mild-moderate MAC. Mild regurgitation. No vegetation.  TRICUSPID VALVE: Normal structure. Moderate-severe regurgitation. No vegetation.  PULMONIC VALVE: Grossly normal structure. No visualized regurgitation. No apparent vegetation.  INTERATRIAL SEPTUM: Small PFO seen by color Doppler. Positive bubble study--with inspiration there was rapid transit of bubbles across the intraatrial septum  PERICARDIUM: No effusion noted.  DESCENDING AORTA: Mild diffuse plaque seen   CONCLUSION:  Normal appearance of bioprosthetic aortic valve. Gradients consistent with prosthetic valve size. On this study, accelerated flow seen again across LVOT. As she received MAC with propofol (vs. Conscious sedation on last TEE), overall LVOT gradient appeared lower than prior study. No evidence of thrombus. She did have a small PFO again visualized, and her bubble study was rapidly positive with inspiration. She snored loudly throughout procedure and needed jaw thrust/blowby O2 in addition to nasal cannula to maintain saturations. Right sided pressures also mild-moderately elevated.    Buford Dresser, MD, PhD [facility]  [address], [place], [place] [address] [phone]   9:03 AM

## 2019-06-07 NOTE — Progress Notes (Addendum)
Progress Note  Patient Name: [name] Date of Encounter: 06/07/2019  Primary Cardiologist: Martha More, MD   Subjective   Denies chest pain or dyspnea.  Inpatient Medications    Scheduled Meds: . aspirin EC  81 mg Oral Daily  . metoprolol tartrate  12.5 mg Oral BID  . norethindrone  5 mg Oral BID  . potassium chloride  40 mEq Oral Daily  . pravastatin  40 mg Oral q1800  . sodium chloride flush  3 mL Intravenous Once  . sodium chloride flush  3 mL Intravenous Q12H  . torsemide  40 mg Oral Daily   Continuous Infusions: . sodium chloride    .  ceFAZolin (ANCEF) IV    . heparin 1,650 Units/hr (06/07/19 0829)   PRN Meds: sodium chloride, acetaminophen **OR** acetaminophen (TYLENOL) oral liquid 160 mg/5 mL **OR** acetaminophen, EPINEPHrine, senna-docusate, sodium chloride flush   Vital Signs    Vitals:   06/07/19 0920 06/07/19 0930 06/07/19 0940 06/07/19 1014  BP: (!) 112/50 (!) 116/46 (!) 119/41 (!) 100/53  Pulse: 88 87  80  Resp: (!) 21 19 (!) 22 16  Temp:    97.8 F (36.6 C)  TempSrc:    Oral  SpO2: 97% 95% 100% 96%  Weight:      Height:        Intake/Output Summary (Last 24 hours) at 06/07/2019 1114 Last data filed at 06/07/2019 0910 Gross per 24 hour  Intake 750 ml  Output -  Net 750 ml   Last 3 Weights 06/07/2019 06/06/2019 06/05/2019  Weight (lbs) 278 lb 249 lb 12.8 oz 272 lb 14.9 oz  Weight (kg) 126.1 kg 113.309 kg 123.8 kg      Telemetry    NSR in 80-100s - Personally Reviewed  ECG    No new tracing - Personally Reviewed  Physical Exam  Morbidly obese GEN: No acute distress.   Neck: No JVD Cardiac: RRR, 2/6 systolic murmur loudest at RUSB,  Respiratory: Clear to auscultation bilaterally. GI: Soft, nontender, non-distended  MS: B ankle and calf edema, L>R with redness and warmth;  Neuro:  Nonfocal  Psych: Normal affect   Labs    High Sensitivity Troponin:  No results for input(s): TROPONINIHS in the last 720 hours.    Chemistry  Recent Labs  Lab 06/03/19 1618  06/05/19 0342 06/06/19 0518 06/07/19 0524  NA 135   < > 135 132* 135  K 3.8   < > 3.1* 3.2* 4.1  CL 103   < > 98 93* 97*  CO2 23  --  27 26 28   GLUCOSE 184*   < > 117* 128* 194*  BUN 11   < > 9 10 10   CREATININE 0.96   < > 1.06* 0.98 1.07*  CALCIUM 9.0  --  8.4* 8.3* 8.5*  PROT 7.2  --   --   --   --   ALBUMIN 2.9*  --   --   --   --   AST 23  --   --   --   --   ALT 18  --   --   --   --   ALKPHOS 41  --   --   --   --   BILITOT 0.4  --   --   --   --   GFRNONAA >60  --  55* >60 55*  GFRAA >60  --  >60 >60 >60  ANIONGAP 9  --  10  13 10   < > = values in this interval not displayed.     Hematology Recent Labs  Lab 06/05/19 0342 06/06/19 0518 06/07/19 0357  WBC 15.1* 15.6* 14.8*  RBC 4.21 4.43 4.22  HGB 8.6* 9.1* 9.0*  HCT 31.6* 32.5* 31.1*  MCV 75.1* 73.4* 73.7*  MCH 20.4* 20.5* 21.3*  MCHC 27.2* 28.0* 28.9*  RDW 19.4* 19.4* 19.1*  PLT 371 413* 375    BNPNo results for input(s): BNP, PROBNP in the last 168 hours.   DDimer No results for input(s): DDIMER in the last 168 hours.   Radiology    No results found.  Cardiac Studies   Echocardiogram 06/04/2019 1. Left ventricular ejection fraction, by visual estimation, is >75%. The left ventricle has hyperdynamic function. Normal left ventricular size. There is mildly increased left ventricular hypertrophy. 2. Left ventricular diastolic Doppler parameters are consistent with pseudonormalization pattern of LV diastolic filling. 3. Global right ventricle has normal systolic function.The right ventricular size is normal. No increase in right ventricular wall thickness. 4. Left atrial size was mildly dilated. 5. Mild to moderate mitral annular calcification. 6. The tricuspid valve is grossly normal. Tricuspid valve regurgitation moderate-severe. 7. The aortic valve was not well visualized 8. AV is thickened, calcified with restricted motion. Peak and mean gradients through the  valve are 43 and 25 mm Hg respectively consistent with mild to moderate AS. Since echo in July 2020, [place] in mean gradient.  TEE 07/01/2018 - Left ventricle: There was severe focal basal hypertrophy of the septum. Systolic function was vigorous. The estimated ejection fraction was in the range of 65% to 70%. Dynamic obstruction cannot be excluded. Wall motion was normal; there were no regional wall motion abnormalities. The outflow tract showed muscular obstruction with a peak resting gradient of 52 mmHg. - Aortic valve: A prosthesis was present and functioning normally. The prosthesis had a normal range of motion. The sewing ring appeared normal, had no rocking motion, and showed no evidence of dehiscence. - Mitral valve: There was mild regurgitation. - Left atrium: No evidence of thrombus in the atrial cavity or appendage. - Right ventricle: Systolic function was normal. - Right atrium: No evidence of thrombus in the atrial cavity or appendage. - Atrial septum: There was a patent foramen ovale. - Tricuspid valve: There was trivial regurgitation.  Impressions:  - Images show normal function of bioprosthetic aortic valve. Leaflets not fully visualized but appear to open well. Color doppler shows open flow into valve orifice.  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 Due to sedation, provocation maneuvers such as valsalva were unable to be performed.  TEE suggests normal aortic valve function with LVOT gradient.  Cardiac catheterization 04/01/2019 IMPRESSION:[name] has normal coronary arteries and mildly elevated filling pressures. I do not think her chest pain is ischemically mediated. Her enzymes are low and flat. It could be related to demand ischemia. Continue medical therapy will be recommended. The sheaths were removed and a TR band was placed on  the right wrist to achieve patent hemostasis. The patient left lab in stable condition. The findings were reviewed with Dr. Dorris [name].  Patient Profile     63 y.o. 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  Mild ascending aortic aneurysm (4.7 cm, stable in size), no coronary  artery disease. Cellulitis L leg.  Assessment & Plan    1. AS of bioprosthetic valve: Technical quality of CT provided limited assessment of the aortic valve biological prosthesis but did note hypoattenuared areas under leaflets that could represents thrombosis.  No evidence of thrombus on TEE.  Suspect elevated gradients due to LVOT obstruction.  Starting on warfarin given TIA as below, can plan to reassess gradients after 3 months anticoagulation  2. Asc Ao aneurysm: Relatively mild and stable in size, unlikely to change decision regarding medical management/TAVR/surgical AVR.  3. TIA: This could be secondary to bioprosthetic aortic valve leaflet thrombosis as above, but could also be secondary to as yet undiagnosed atrial fibrillation.  Extremely frequent PACs and brief runs of atrial tachycardia on telemetry.  Also with PFO.  Starting on warfarin, will bridge with lovenox.  Will plan for 30 day monitor on discharge  4.  Chronic microcytic anemia: ongoing dysfunctional/postmenopausal bleeding. Etiology not yet defined.  This would have implications surrounding chronic anticoagulation. Anemia exaggerates the aortic outflow gradients.  Hgb has been stable at 9.0 on anticoagulation.  5.  Morbid obesity: contributing to higher gradients across the left ventricular outflow tract and is part of the reason for her dyspnea.  6. LVOT obstruction: Prefer beta-blockers for treatment of hypertension, will increase metoprolol to 25 mg BID given elevated BP.   Avoid excessive diuretics and avoid potent vasodilators. Holding torsemide.    [facility] will sign off.   Medication Recommendations:  Metoprolol 25 mg BID, warfarin per pharmacy with lovenox bridge.  Hold torsemide for now Other recommendations (labs, testing, etc):  30 day cardiac moitor Follow up as an outpatient:  Scheduled for coumadin clinic 10/1.  F/u with Dr Bettina Gavia scheduled for 10/19.    For questions or updates, please contact [place] Please consult [email or web address] for contact info under        Signed, Donato Heinz, MD  06/07/2019, 11:14 AM

## 2019-06-08 ENCOUNTER — Encounter (HOSPITAL_COMMUNITY): Payer: Self-pay | Admitting: Cardiology

## 2019-06-08 ENCOUNTER — Other Ambulatory Visit: Payer: Self-pay | Admitting: Physician Assistant

## 2019-06-08 DIAGNOSIS — G459 Transient cerebral ischemic attack, unspecified: Secondary | ICD-10-CM

## 2019-06-08 LAB — BASIC METABOLIC PANEL
Anion gap: 11 (ref 5–15)
BUN: 11 mg/dL (ref 8–23)
CO2: 27 mmol/L (ref 22–32)
Calcium: 8.7 mg/dL — ABNORMAL LOW (ref 8.9–10.3)
Chloride: 96 mmol/L — ABNORMAL LOW (ref 98–111)
Creatinine, Ser: 1.11 mg/dL — ABNORMAL HIGH (ref 0.44–1.00)
GFR calc Af Amer: >60 mL/min (ref >60)
GFR calc non Af Amer: 52 mL/min — ABNORMAL LOW (ref >60)
Glucose, Bld: 149 mg/dL — ABNORMAL HIGH (ref 70–99)
Potassium: 3.4 mmol/L — ABNORMAL LOW (ref 3.5–5.1)
Sodium: 134 mmol/L — ABNORMAL LOW (ref 135–145)

## 2019-06-08 LAB — CBC WITH DIFFERENTIAL/PLATELET
Abs Immature Granulocytes: 0.41 10*3/uL — ABNORMAL HIGH (ref 0.00–0.07)
Basophils Absolute: 0.1 10*3/uL (ref 0.0–0.1)
Basophils Relative: 1 %
Eosinophils Absolute: 0.3 10*3/uL (ref 0.0–0.5)
Eosinophils Relative: 2 %
HCT: 34.2 % — ABNORMAL LOW (ref 36.0–46.0)
Hemoglobin: 9.1 g/dL — ABNORMAL LOW (ref 12.0–15.0)
Immature Granulocytes: 2 %
Lymphocytes Relative: 14 %
Lymphs Abs: 2.5 10*3/uL (ref 0.7–4.0)
MCH: 20.3 pg — ABNORMAL LOW (ref 26.0–34.0)
MCHC: 26.6 g/dL — ABNORMAL LOW (ref 30.0–36.0)
MCV: 76.3 fL — ABNORMAL LOW (ref 80.0–100.0)
Monocytes Absolute: 1.5 10*3/uL — ABNORMAL HIGH (ref 0.1–1.0)
Monocytes Relative: 9 %
Neutro Abs: 12.6 10*3/uL — ABNORMAL HIGH (ref 1.7–7.7)
Neutrophils Relative %: 72 %
Platelets: 403 10*3/uL — ABNORMAL HIGH (ref 150–400)
RBC: 4.48 MIL/uL (ref 3.87–5.11)
RDW: 19.7 % — ABNORMAL HIGH (ref 11.5–15.5)
WBC: 17.3 10*3/uL — ABNORMAL HIGH (ref 4.0–10.5)
nRBC: 0.5 % — ABNORMAL HIGH (ref 0.0–0.2)

## 2019-06-08 LAB — PROTIME-INR
INR: 1.2 (ref 0.8–1.2)
Prothrombin Time: 14.7 seconds (ref 11.4–15.2)

## 2019-06-08 LAB — LACTIC ACID, PLASMA: Lactic Acid, Venous: 1.7 mmol/L (ref 0.5–1.9)

## 2019-06-08 LAB — GLUCOSE, CAPILLARY: Glucose-Capillary: 179 mg/dL — ABNORMAL HIGH (ref 70–99)

## 2019-06-08 LAB — MAGNESIUM: Magnesium: 2.2 mg/dL (ref 1.7–2.4)

## 2019-06-08 LAB — PROCALCITONIN: Procalcitonin: <0.1 ng/mL

## 2019-06-08 MED ORDER — MENTHOL 3 MG MT LOZG: 1.0000 | LOZENGE | OROMUCOSAL | Status: DC | PRN

## 2019-06-08 MED ORDER — WARFARIN SODIUM 7.5 MG PO TABS
7.5000 mg | ORAL_TABLET | Freq: Once | ORAL | Status: AC
  Administered 2019-06-08: 7.5 mg via ORAL
  Filled 2019-06-08: qty 1

## 2019-06-08 MED ORDER — METOPROLOL TARTRATE 25 MG PO TABS
25.0000 mg | ORAL_TABLET | Freq: Two times a day (BID) | ORAL | Status: DC
  Administered 2019-06-08 – 2019-06-09 (×3): 25 mg via ORAL
  Filled 2019-06-08 (×3): qty 1

## 2019-06-08 MED ORDER — INSULIN ASPART 100 UNIT/ML ~~LOC~~ SOLN: 0.0000 [IU] | Freq: Every day | SUBCUTANEOUS | Status: DC

## 2019-06-08 MED ORDER — INSULIN ASPART 100 UNIT/ML ~~LOC~~ SOLN
0.0000 [IU] | Freq: Three times a day (TID) | SUBCUTANEOUS | Status: DC
  Administered 2019-06-09: 1 [IU] via SUBCUTANEOUS

## 2019-06-08 NOTE — Progress Notes (Addendum)
ANTICOAGULATION CONSULT NOTE  Pharmacy Consult: Heparin Indication:  History of AVR with TIA and possible Afib  Allergies  Allergen Reactions  . Vancomycin Itching    Patient Measurements: Height: 5\' 2"  (157.5 cm) Weight: 274 lb 4 oz (124.4 kg) IBW/kg (Calculated) : 50.1  Vital Signs: Temp: 99 F (37.2 C) (09/29 0831) Temp Source: Oral (09/29 0831) BP: 122/82 (09/29 0917) Pulse Rate: 104 (09/29 0917)  Labs: Recent Labs    06/06/19 0518 06/06/19 1359 06/07/19 0357 06/07/19 0524 06/08/19 0403  HGB 9.1*  --  9.0*  --  9.1*  HCT 32.5*  32.0*  --  31.1*  --  34.2*  PLT 413*  --  375  --  403*  LABPROT  --   --   --   --  14.7  INR  --   --   --   --  1.2  HEPARINUNFRC 0.52 0.44 0.34  --   --   CREATININE 0.98  --   --  1.07* 1.11*    Estimated Creatinine Clearance: 64.5 mL/min (A) (by C-G formula based on SCr of 1.11 mg/dL (H)).   Assessment: 46 YOF presented with speech difficulty, numbness and weakness.  Pharmacy consulted to dose IV heparin for possible AV leaflet thrombosis.  TEE on 06/07/19 negative for thrombus and positive for small PFO.  Cards recommends anticoagulation for 3 months due to history of AVR in setting of TIA and possible Afib.  Patient was transitioned to Lovenox bridge to Coumadin on 06/07/19.  INR is sub-therapeutic as expected.  Renal function is relatively stable.  No bleeding reported.  Goal of Therapy:  Anti-Xa level 0.6-1 units/ml 4hrs after LMWH dose given   Plan:  Repeat Coumadin 7.5mg  PO today Continue Lovenox 120mg  SQ Q12H per MD Daily PT / INR CBC Q72H while on Lovenox  Camie Hauss D. Mina Marble, PharmD, BCPS, [place] 06/08/2019, 10:07 AM

## 2019-06-08 NOTE — Progress Notes (Signed)
Benefits check revealed a $0 co pay for generic lovenox. CM spoke with patient about her bipap and co payments that have recently started. She states that her [employer] is no longer covering the cost of the bipap. CM attempted to reach [facility] the sleep coordinator for Dr Claiborne Billings who ordered the bipap and who was to send information to [facility] per the patient. CM had to leave a message without a return call. CM will attempt again in the am to see if any additional assistance can be provided to have  [employer]BS cover the cost of the bipap.

## 2019-06-08 NOTE — Progress Notes (Addendum)
PROGRESS NOTE  [name] [id number] DOB: 1956-05-12 DOA: 06/03/2019 PCP: [name], MD  HPI/Recap of past 24 hours: [name] is an 63 y.o. female with medical history significant forlymphedema involving the bilateral lower extremities, hypertension, chronic diastolic CHF, aortic stenosis status post bioprosthetic valve replacement, chronic renal insufficiency, and chronic anemia attributed to dysfunctional uterine bleeding, now presenting to the emergency department after an episode of transient speech difficulty and left upper extremity numbness and weakness.  Intermittent uterine bleeding on hormonal therapy  Hospital course complicated by adverse reaction to IV vancomycin with severe pruritus resolved after IV Benadryl, IV Solu-Medrol and IV Pepcid.  06/07/19: Patient was seen and examined at her bedside, her husband present in the room.  Reports he feels better after treatment as stated above.  She denies any chest pain at rest.  Admits to worsening tenderness of left lower extremity cellulitis.  Will start Ancef.  06/08/19: Patient was seen and examined at bedside this morning no acute events overnight.  She has no new complaints.  Left lower extremity erythema is improving on IV Ancef.  Worsening leukocytosis, possibly contributed by recent IV steroid use.  Will complete an additional day of IV antibiotics for left lower extremity cellulitis then transition to oral tomorrow for DC planning.     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 No evidence of thrombus on TEE per cardiology. LDL 71 goal Less than 70 HemoGlobin A1c 7.0 goal less than 7.0 Continue aspirin 81 and pravastatin 40 mg daily Started on warfarin, bridging with Lovenox on 06/07/2019 30-day event monitor on DC  Hyperglycemia Not on diabetic medication at home HgA1C 7.0 Recommend follow up with PCP to confirm type 2 diabetes with work up. May need fasting blood glucose and possibly 2-hour glucose challenge. Sensitive insulin sliding scale for hyperglycemia Avoid hypoglycemia  Moderate aortic stenosis of bioprosthetic valve Completed TEE on 06/07/2019 No evidence of thrombus or mass. Small PFO  Per cardiology, technical quality of CT provided limited assessment of the aortic valve biological prosthesis but did note hypoattenuared areas under leaflets that could represents thrombosis.  No evidence of thrombus on TEE. Reasonable to treat with warfarin x3 months and then reassess valve gradients.  Given her TIA, will bridge with lovenox.  LVOT obstruction Continue to hold off diuretics as recommended by cardiology due to LVOT obstruction on TEE  Subtherapeutic INR on Coumadin INR 1.2 Coumadin bridged with subcu Lovenox, pharmacy managing Will follow up at coumadin clinic Repeat INR in the morning  Hypokalemia Potassium 3.4 Repleted  Small PFO as noted on TEE Right-sided pressure mild to moderately elevated  cardiology following  Worsening left lower extremity cellulitis Not improving on Keflex p.o. which she received from 06/04/19 to 06/06/2019 Persistent erythema, edema, warmth and worsening tenderness with mild touch -Adverse reaction to IV vancomycin, stopped (9/27-9/27/20) -Leukocytosis is persistent, worsening acutely from 14 K to 17 K on 06/08/2019.  Possibly contributed by recent IV steroid administration. -Start Ancef on 06/07/2019, continue Monitor  fever curve and WBC   Possible adverse reaction to IV vancomycin On start on 06/06/2019 Treated with IV Benadryl 25 mg 3 times daily x1 day, IV Solu-Medrol 60 mg 1, IV Pepcid. Symptoms have now resolved.  2.02-second pause reported overnight Personally reviewed twelve-lead EKG done on 06/07/2019 reveals sinus rhythm rate of 90.  No specific ST-T changes. Cardiology following  Subclinical hypothyroidism TSH 5.5 Free T4 normal  Ascending aortic aneurysm Mild and stable SBP goal 120  Lymphedema complicated by LLE cellulitis Management as noted above  Persistent leukocytosis, likely in the setting of cellulitis versus reactive MRSA screen negative  Chronic diastolic CHF Appears hypervolemic On torsemide 40 mg daily Continue strict I's and O's and daily weight Monitor urine output, blood pressure, electrolytes and renal function.  Resolved euvolemic hyponatremia Sodium 132 from 135 yesterday Euvolemic on exam Sodium 135 on 06/07/2019  Intermittent uterine bleeding in the setting of post menopausal bleeding diagnosed a year ago Will need to follow-up with gynecology outpatient Hemoglobin stable at 9.0 Norethandrolone  Severe iron deficiency anemia Iron studies revealed trans sat 4, iron 21, ferritin 11 Hemoglobin 9.1 on 06/06/2019 with MCV 73 Transfused 1 dose of IV Feraheme on 510 mg 1 Repeat CBC in the morning  Morbid obesity BMI 49 Recommend weight loss outpatient with regular physical activity and healthy dieting  Physical debility/ambulatory dysfunction PT assessed and recommended outpatient PT Fall precautions   DVT prophylaxis:  Heparin drip Code Status: Full Code.  Family Communication:   Antibiotics: keflex 06/04/19>>06/06/19. IV vancomycin 06/06/19>> 06/06/2019 Ancef 06/07/2019>>  Disposition Plan:  Possible discharge to home tomorrow; requires another day of IV antibiotics for left lower extremity cellulitis.   Objective: Vitals:   06/08/19 0345 06/08/19 0751 06/08/19 0831  06/08/19 0917  BP: 132/71  (!) 144/79 122/82  Pulse: 82  94 (!) 104  Resp: 18  20   Temp: 98 F (36.7 C)  99 F (37.2 C)   TempSrc: Oral  Oral   SpO2: 95%  94%   Weight:  124.4 kg    Height:        Intake/Output Summary (Last 24 hours) at 06/08/2019 1112 Last data filed at 06/07/2019 1828 Gross per 24 hour  Intake 351.8 ml  Output -  Net 351.8 ml   Filed Weights   06/06/19 0608 06/07/19 0405 06/08/19 0751  Weight: 113.3 kg 126.1 kg 124.4 kg    Exam:  . General: 63 y.o. year-old female obese no acute distress.  Alert oriented x3.  .  Cardiovascular: Regular rate and rhythm no rubs or gallops no JVD or thyromegaly. Respiratory: Clear to auscultation no wheezes or rales. Poor inspiratory effort.   . Abdomen: Obese nontender nondistended with bowel sounds present.   . Musculoskeletal: Left lower extremity erythema improving, warm tenderness also improving. Psychiatry: Mood is appropriate for condition and setting.  Data Reviewed: CBC: Recent Labs  Lab 06/03/19 1618 06/03/19 1633 06/05/19 0342 06/06/19 0518 06/07/19 0357 06/08/19 0403  WBC 11.9*  --  15.1* 15.6* 14.8* 17.3*  NEUTROABS 8.7*  --   --   --   --  12.6*  HGB 8.4* 10.2* 8.6* 9.1* 9.0* 9.1*  HCT 31.6* 30.0* 31.6* 32.5*  32.0* 31.1* 34.2*  MCV 77.1*  --  75.1* 73.4* 73.7* 76.3*  PLT 344  --  371 413* 375 [id number]*   Basic Metabolic Panel: Recent Labs  Lab 06/03/19 1618 06/03/19 1633 06/05/19 0342 06/06/19 0518 06/07/19 0524 06/08/19 0403  NA 135 137 135 132* 135  134*  K 3.8 3.9 3.1* 3.2* 4.1 3.4*  CL 103 104 98 93* 97* 96*  CO2 23  --  27 26 28 27   GLUCOSE 184* 178* 117* 128* 194* 149*  BUN 11 11 9 10 10 11   CREATININE 0.96 0.90 1.06* 0.98 1.07* 1.11*  CALCIUM 9.0  --  8.4* 8.3* 8.5* 8.7*  MG  --   --   --  2.1  --  2.2   GFR: Estimated Creatinine Clearance: 64.5 mL/min (A) (by C-G formula based on SCr of 1.11 mg/dL (H)). Liver Function Tests: Recent Labs  Lab 06/03/19 1618  AST 23  ALT 18   ALKPHOS 41  BILITOT 0.4  PROT 7.2  ALBUMIN 2.9*   No results for input(s): LIPASE, AMYLASE in the last 168 hours. No results for input(s): AMMONIA in the last 168 hours. Coagulation Profile: Recent Labs  Lab 06/03/19 1618 06/08/19 0403  INR 1.1 1.2   Cardiac Enzymes: No results for input(s): CKTOTAL, CKMB, CKMBINDEX, TROPONINI in the last 168 hours. BNP (last 3 results) Recent Labs    06/26/18 1352 08/14/18 1058 02/12/19 1116  PROBNP 698* 876* 640*   HbA1C: No results for input(s): HGBA1C in the last 72 hours. CBG: Recent Labs  Lab 06/04/19 0105  GLUCAP 125*   Lipid Profile: No results for input(s): CHOL, HDL, LDLCALC, TRIG, CHOLHDL, LDLDIRECT in the last 72 hours. Thyroid Function Tests: Recent Labs    06/05/19 1650 06/07/19 0524  TSH 5.585*  --   FREET4  --  0.96   Anemia Panel: Recent Labs    06/05/19 1650  VITAMINB12 222  FERRITIN 11  TIBC 508*  IRON 21*   Urine analysis: No results found for: COLORURINE, APPEARANCEUR, LABSPEC, PHURINE, GLUCOSEU, HGBUR, BILIRUBINUR, KETONESUR, PROTEINUR, UROBILINOGEN, NITRITE, LEUKOCYTESUR Sepsis Labs: @LABRCNTIP (procalcitonin:4,lacticidven:4)  ) Recent Results (from the past 240 hour(s))  SARS CORONAVIRUS 2 (TAT 6-24 HRS) Nasopharyngeal Nasopharyngeal Swab     Status: None   Collection Time: 06/03/19 10:31 PM   Specimen: Nasopharyngeal Swab  Result Value Ref Range Status   SARS Coronavirus 2 NEGATIVE NEGATIVE Final    Comment: (NOTE) SARS-CoV-2 target nucleic acids are NOT DETECTED. The SARS-CoV-2 RNA is generally detectable in upper and lower respiratory specimens during the acute phase of infection. Negative results do not preclude SARS-CoV-2 infection, do not rule out co-infections with other pathogens, and should not be used as the sole basis for treatment or other patient management decisions. Negative results must be combined with clinical observations, patient history, and epidemiological  information. The expected result is Negative. Fact Sheet for Patients: SugarRoll.be Fact Sheet for Healthcare Providers: https://www.woods-mathews.com/ This test is not yet approved or cleared by the [place] FDA and  has been authorized for detection and/or diagnosis of SARS-CoV-2 by FDA under an Emergency Use Authorization (EUA). This EUA will remain  in effect (meaning this test can be used) for the duration of the COVID-19 declaration under Section 56 4(b)(1) of the Act, 21 U.S.C. section 360bbb-3(b)(1), unless the authorization is terminated or revoked sooner. Performed at [facility], [place] [address], [place], [place] [address]   MRSA PCR Screening     Status: None   Collection Time: 06/06/19 11:05 AM   Specimen: Nasal Mucosa; Nasopharyngeal  Result Value Ref Range Status   MRSA by PCR NEGATIVE NEGATIVE Final    Comment:        The GeneXpert MRSA Assay (FDA approved for NASAL specimens only), is one component of a  comprehensive MRSA colonization surveillance program. It is not intended to diagnose MRSA infection nor to guide or monitor treatment for MRSA infections. Performed at [facility], [place] [address], [place], [place] [address]       Studies: No results found.  Scheduled Meds: . aspirin EC  81 mg Oral Daily  . enoxaparin (LOVENOX) injection  120 mg Subcutaneous Q12H  . metoprolol tartrate  25 mg Oral BID  . norethindrone  5 mg Oral BID  . potassium chloride  40 mEq Oral Daily  . pravastatin  40 mg Oral q1800  . sodium chloride flush  3 mL Intravenous Once  . sodium chloride flush  3 mL Intravenous Q12H  . warfarin  7.5 mg Oral ONCE-1800  . Warfarin - Pharmacist Dosing Inpatient   Does not apply q1800    Continuous Infusions: . sodium chloride    .  ceFAZolin (ANCEF) IV 2 g (06/08/19 0540)     LOS: 4 days     Kayleen Memos, MD [facility] Pager [phone]  If 7PM-7AM, please  contact night-coverage [email or web address] Password [email or web address] 06/08/2019, 11:12 AM

## 2019-06-08 NOTE — Progress Notes (Addendum)
Dr. Gardiner Rhyme requested post-hospital f/u arrangement. I was unable to schedule in Epic and could not reach office. I sent message to scheduling department to call patient with appointment information.  Addendum: f/u scheduled 10/19; scheduling notified pt. I updated AVS.   Dr. Gardiner Rhyme also requests a 30 day monitor specifying it does not have to be live. I sent office a message to help arrange and placed order.  Martha Kyllo PA-C

## 2019-06-08 NOTE — Progress Notes (Signed)
Benefits check submitted for lovenox.

## 2019-06-08 NOTE — TOC Benefit Eligibility Note (Signed)
Transition of Care [facility]) Benefit Eligibility Note    Patient Details  Name: [name] MRN: [id number] Date of Birth: [birth year]   Medication/Dose: ENOXAPARIN 120 MG BID SYRINGES  Covered?: Yes  Tier: (NO TIER)  Prescription Coverage [facility]erred [facility]  Spoke with Person/Company/Phone Number:: KATHY  @ [facility] [employer] # [phone] OPT- MEMBER  Co-Pay: ZERO DOLLARS  Prior Approval: No  Deductible: Met  Additional Notes: LOVENOX 120 MG BID SYRINGES , NOT COVER , P/A # [phone]    Memory Argue Phone Number: 06/08/2019, 1:30 PM

## 2019-06-09 LAB — GLUCOSE, CAPILLARY
Glucose-Capillary: 123 mg/dL — ABNORMAL HIGH (ref 70–99)
Glucose-Capillary: 109 mg/dL — ABNORMAL HIGH (ref 70–99)

## 2019-06-09 LAB — CBC WITH DIFFERENTIAL/PLATELET
Abs Immature Granulocytes: 0.52 10*3/uL — ABNORMAL HIGH (ref 0.00–0.07)
Basophils Absolute: 0.1 10*3/uL (ref 0.0–0.1)
Basophils Relative: 1 %
Eosinophils Absolute: 0.5 10*3/uL (ref 0.0–0.5)
Eosinophils Relative: 3 %
HCT: 32.2 % — ABNORMAL LOW (ref 36.0–46.0)
Hemoglobin: 9 g/dL — ABNORMAL LOW (ref 12.0–15.0)
Immature Granulocytes: 3 %
Lymphocytes Relative: 15 %
Lymphs Abs: 2.7 10*3/uL (ref 0.7–4.0)
MCH: 21.1 pg — ABNORMAL LOW (ref 26.0–34.0)
MCHC: 28 g/dL — ABNORMAL LOW (ref 30.0–36.0)
MCV: 75.6 fL — ABNORMAL LOW (ref 80.0–100.0)
Monocytes Absolute: 1.3 10*3/uL — ABNORMAL HIGH (ref 0.1–1.0)
Monocytes Relative: 8 %
Neutro Abs: 12.7 10*3/uL — ABNORMAL HIGH (ref 1.7–7.7)
Neutrophils Relative %: 70 %
Platelets: 358 10*3/uL (ref 150–400)
RBC: 4.26 MIL/uL (ref 3.87–5.11)
RDW: 20.5 % — ABNORMAL HIGH (ref 11.5–15.5)
WBC: 17.9 10*3/uL — ABNORMAL HIGH (ref 4.0–10.5)
nRBC: 0.7 % — ABNORMAL HIGH (ref 0.0–0.2)

## 2019-06-09 LAB — BASIC METABOLIC PANEL
Anion gap: 10 (ref 5–15)
BUN: 12 mg/dL (ref 8–23)
CO2: 26 mmol/L (ref 22–32)
Calcium: 8.7 mg/dL — ABNORMAL LOW (ref 8.9–10.3)
Chloride: 95 mmol/L — ABNORMAL LOW (ref 98–111)
Creatinine, Ser: 1.09 mg/dL — ABNORMAL HIGH (ref 0.44–1.00)
GFR calc Af Amer: >60 mL/min (ref >60)
GFR calc non Af Amer: 54 mL/min — ABNORMAL LOW (ref >60)
Glucose, Bld: 138 mg/dL — ABNORMAL HIGH (ref 70–99)
Potassium: 3.7 mmol/L (ref 3.5–5.1)
Sodium: 131 mmol/L — ABNORMAL LOW (ref 135–145)

## 2019-06-09 LAB — PROTIME-INR
INR: 1.6 — ABNORMAL HIGH (ref 0.8–1.2)
Prothrombin Time: 19.2 seconds — ABNORMAL HIGH (ref 11.4–15.2)

## 2019-06-09 MED ORDER — METOPROLOL TARTRATE 25 MG PO TABS: 25.0000 mg | ORAL_TABLET | Freq: Two times a day (BID) | ORAL | 0 refills | Status: DC

## 2019-06-09 MED ORDER — CEPHALEXIN 500 MG PO CAPS: 500.0000 mg | ORAL_CAPSULE | Freq: Three times a day (TID) | ORAL | Status: DC

## 2019-06-09 MED ORDER — PRAVASTATIN SODIUM 40 MG PO TABS: 40.0000 mg | ORAL_TABLET | Freq: Every day | ORAL | 0 refills | Status: AC

## 2019-06-09 MED ORDER — SARNA 0.5-0.5 % EX LOTN: 1.0000 "application " | TOPICAL_LOTION | CUTANEOUS | 0 refills | Status: DC | PRN

## 2019-06-09 MED ORDER — WARFARIN SODIUM 10 MG PO TABS: 10.0000 mg | ORAL_TABLET | Freq: Every day | ORAL | 0 refills | Status: DC

## 2019-06-09 MED ORDER — WARFARIN SODIUM 5 MG PO TABS
10.0000 mg | ORAL_TABLET | Freq: Once | ORAL | Status: AC
  Administered 2019-06-09: 10 mg via ORAL
  Filled 2019-06-09: qty 2

## 2019-06-09 MED ORDER — CEPHALEXIN 500 MG PO CAPS: 500.0000 mg | ORAL_CAPSULE | Freq: Three times a day (TID) | ORAL | 0 refills | Status: AC

## 2019-06-09 MED ORDER — ENOXAPARIN SODIUM 120 MG/0.8ML ~~LOC~~ SOLN: 120.0000 mg | Freq: Two times a day (BID) | SUBCUTANEOUS | 0 refills | Status: DC

## 2019-06-09 NOTE — Progress Notes (Signed)
NURSING PROGRESS NOTE  INIYAH PINERA [id number] Discharge Data: 06/09/2019 6:22 PM Attending Provider: Kayleen Memos, DO [id number], Angelique Blonder, MD     [name]  D/C'd  per MD order.  Discussed with the patient the After Visit Summary and all questions fully answered. All IV's discontinued with no bleeding noted. All belongings returned to patient for patient to take home. Education complete for lovenox injection and coumadin dose. Pt transferred home via private vehicle.  Last Vital Signs:  Blood pressure (!) 105/45, pulse 82, temperature 98.1 F (36.7 C), temperature source Oral, resp. rate 16, height 5\' 2"  (1.575 m), weight 125.1 kg, SpO2 96 %.  Discharge Medication List Allergies as of 06/09/2019      Reactions   Vancomycin Itching      Medication List    STOP taking these medications   potassium chloride 10 MEQ tablet Commonly known as: KLOR-CON   telmisartan 20 MG tablet Commonly known as: MICARDIS   torsemide 20 MG tablet Commonly known as: DEMADEX     TAKE these medications   aspirin EC 81 MG tablet Take 81 mg by mouth daily. Notes to patient: Take 06/10/2019   cephALEXin 500 MG capsule Commonly known as: KEFLEX Take 1 capsule (500 mg total) by mouth every 8 (eight) hours for 7 days.   enoxaparin 120 MG/0.8ML injection Commonly known as: LOVENOX Inject 0.8 mLs (120 mg total) into the skin every 12 (twelve) hours for 4 days.   metoprolol tartrate 25 MG tablet Commonly known as: LOPRESSOR Take 1 tablet (25 mg total) by mouth 2 (two) times daily. What changed:   medication strength  how much to take   norethindrone 5 MG tablet Commonly known as: AYGESTIN Take 5 mg by mouth 2 (two) times daily.   pravastatin 40 MG tablet Commonly known as: PRAVACHOL Take 1 tablet (40 mg total) by mouth daily at 6 PM. What changed:   medication strength  how much to take  when to take this   Sarna lotion Generic drug: camphor-menthol Apply 1 application  topically as needed for itching.   warfarin 10 MG tablet Commonly known as: COUMADIN Take 1 tablet (10 mg total) by mouth daily at 6 PM for 10 days.

## 2019-06-09 NOTE — Progress Notes (Signed)
ANTICOAGULATION CONSULT NOTE  Pharmacy Consult: Heparin Indication:  History of AVR with TIA and possible Afib   Patient Measurements: Height: 5\' 2"  (157.5 cm) Weight: 275 lb 12.7 oz (125.1 kg) IBW/kg (Calculated) : 50.1  Vital Signs: Temp: 98.1 F (36.7 C) (09/30 0753) Temp Source: Oral (09/30 0753) BP: 134/50 (09/30 0753) Pulse Rate: 83 (09/30 0753)  Labs: Recent Labs    06/06/19 1359  06/07/19 0357 06/07/19 0524 06/08/19 0403 06/09/19 0427  HGB  --    < > 9.0*  --  9.1* 9.0*  HCT  --   --  31.1*  --  34.2* 32.2*  PLT  --   --  375  --  403* 358  LABPROT  --   --   --   --  14.7 19.2*  INR  --   --   --   --  1.2 1.6*  HEPARINUNFRC 0.44  --  0.34  --   --   --   CREATININE  --   --   --  1.07* 1.11* 1.09*   < > = values in this interval not displayed.    Estimated Creatinine Clearance: 65.9 mL/min (A) (by C-G formula based on SCr of 1.09 mg/dL (H)).   Assessment: 42 YOF presented with speech difficulty, numbness and weakness.  Pharmacy consulted for anticoagulation for possible AV leaflet thrombosis. TEE on 06/07/19 negative for thrombus and positive for small PFO.  Cards recommends anticoagulation for 3 months due to history of AVR in setting of TIA and possible Afib.  Patient was transitioned from heparin to Lovenox bridge to Coumadin on 06/07/19.  INR is sub-therapeutic as expected after only 2 days of warfarin.  Goal of Therapy:  Anti-Xa level 0.6-1 units/ml 4hrs after LMWH dose given INR 2-3   Plan:  Warfarin 10 mg po x1 Continue Lovenox 120 mg SQ Q12H  Daily INR CBC Q72H while on Lovenox   Harvel Quale 06/09/2019 8:17 AM

## 2019-06-09 NOTE — Progress Notes (Signed)
Progress Note  Patient Name: [name] Date of Encounter: 06/09/2019  Primary Cardiologist: Shirlee More, MD   Subjective   Denies chest pain or dyspnea.  Inpatient Medications    Scheduled Meds: . aspirin EC  81 mg Oral Daily  . enoxaparin (LOVENOX) injection  120 mg Subcutaneous Q12H  . insulin aspart  0-5 Units Subcutaneous QHS  . insulin aspart  0-9 Units Subcutaneous TID WC  . metoprolol tartrate  25 mg Oral BID  . norethindrone  5 mg Oral BID  . potassium chloride  40 mEq Oral Daily  . pravastatin  40 mg Oral q1800  . sodium chloride flush  3 mL Intravenous Once  . sodium chloride flush  3 mL Intravenous Q12H  . Warfarin - Pharmacist Dosing Inpatient   Does not apply q1800   Continuous Infusions: . sodium chloride    .  ceFAZolin (ANCEF) IV 2 g (06/09/19 0517)   PRN Meds: sodium chloride, acetaminophen **OR** acetaminophen (TYLENOL) oral liquid 160 mg/5 mL **OR** acetaminophen, EPINEPHrine, menthol-cetylpyridinium, senna-docusate, sodium chloride flush   Vital Signs    Vitals:   06/08/19 2016 06/08/19 2329 06/09/19 0351 06/09/19 0500  BP: (!) 147/71 126/75 140/66   Pulse: 98 93 97   Resp: 17 18 19    Temp: 98.2 F (36.8 C) 98.3 F (36.8 C) 98.6 F (37 C)   TempSrc: Oral Oral Oral   SpO2: 95% 95% 93%   Weight:    125.1 kg  Height:        Intake/Output Summary (Last 24 hours) at 06/09/2019 0722 Last data filed at 06/08/2019 1631 Gross per 24 hour  Intake 540 ml  Output -  Net 540 ml   Last 3 Weights 06/09/2019 06/08/2019 06/07/2019  Weight (lbs) 275 lb 12.7 oz 274 lb 4 oz 278 lb  Weight (kg) 125.1 kg 124.4 kg 126.1 kg      Telemetry    NSR in 80-100s - Personally Reviewed  ECG    No new tracing - Personally Reviewed  Physical Exam  Morbidly obese GEN: No acute distress.   Neck: No JVD Cardiac: RRR, 2/6 systolic murmur loudest at RUSB,  Respiratory: Clear to auscultation bilaterally. GI: Soft, nontender, non-distended  MS: B ankle and  calf edema, L>R with redness and warmth;  Neuro:  Nonfocal  Psych: Normal affect   Labs    High Sensitivity Troponin:  No results for input(s): TROPONINIHS in the last 720 hours.    Chemistry Recent Labs  Lab 06/03/19 1618  06/07/19 0524 06/08/19 0403 06/09/19 0427  NA 135   < > 135 134* 131*  K 3.8   < > 4.1 3.4* 3.7  CL 103   < > 97* 96* 95*  CO2 23   < > 28 27 26   GLUCOSE 184*   < > 194* 149* 138*  BUN 11   < > 10 11 12   CREATININE 0.96   < > 1.07* 1.11* 1.09*  CALCIUM 9.0   < > 8.5* 8.7* 8.7*  PROT 7.2  --   --   --   --   ALBUMIN 2.9*  --   --   --   --   AST 23  --   --   --   --   ALT 18  --   --   --   --   ALKPHOS 41  --   --   --   --   BILITOT 0.4  --   --   --   --  GFRNONAA >60   < > 55* 52* 54*  GFRAA >60   < > >60 >60 >60  ANIONGAP 9   < > 10 11 10    < > = values in this interval not displayed.     Hematology Recent Labs  Lab 06/07/19 0357 06/08/19 0403 06/09/19 0427  WBC 14.8* 17.3* 17.9*  RBC 4.22 4.48 4.26  HGB 9.0* 9.1* 9.0*  HCT 31.1* 34.2* 32.2*  MCV 73.7* 76.3* 75.6*  MCH 21.3* 20.3* 21.1*  MCHC 28.9* 26.6* 28.0*  RDW 19.1* 19.7* 20.5*  PLT 375 403* 358    BNPNo results for input(s): BNP, PROBNP in the last 168 hours.   DDimer No results for input(s): DDIMER in the last 168 hours.   Radiology    No results found.  Cardiac Studies   Echocardiogram 06/04/2019 1. Left ventricular ejection fraction, by visual estimation, is >75%. The left ventricle has hyperdynamic function. Normal left ventricular size. There is mildly increased left ventricular hypertrophy. 2. Left ventricular diastolic Doppler parameters are consistent with pseudonormalization pattern of LV diastolic filling. 3. Global right ventricle has normal systolic function.The right ventricular size is normal. No increase in right ventricular wall thickness. 4. Left atrial size was mildly dilated. 5. Mild to moderate mitral annular calcification. 6. The tricuspid  valve is grossly normal. Tricuspid valve regurgitation moderate-severe. 7. The aortic valve was not well visualized 8. AV is thickened, calcified with restricted motion. Peak and mean gradients through the valve are 43 and 25 mm Hg respectively consistent with mild to moderate AS. Since echo in July 2020, [place] in mean gradient.  TEE 07/01/2018 - Left ventricle: There was severe focal basal hypertrophy of the septum. Systolic function was vigorous. The estimated ejection fraction was in the range of 65% to 70%. Dynamic obstruction cannot be excluded. Wall motion was normal; there were no regional wall motion abnormalities. The outflow tract showed muscular obstruction with a peak resting gradient of 52 mmHg. - Aortic valve: A prosthesis was present and functioning normally. The prosthesis had a normal range of motion. The sewing ring appeared normal, had no rocking motion, and showed no evidence of dehiscence. - Mitral valve: There was mild regurgitation. - Left atrium: No evidence of thrombus in the atrial cavity or appendage. - Right ventricle: Systolic function was normal. - Right atrium: No evidence of thrombus in the atrial cavity or appendage. - Atrial septum: There was a patent foramen ovale. - Tricuspid valve: There was trivial regurgitation.  Impressions:  - Images show normal function of bioprosthetic aortic valve. Leaflets not fully visualized but appear to open well. Color doppler shows open flow into valve orifice.  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 Due to sedation, provocation maneuvers such as valsalva were unable to be performed.  TEE suggests normal aortic valve function with LVOT gradient.  Cardiac catheterization 04/01/2019 IMPRESSION:Ms. Gutherie has normal coronary arteries and mildly elevated filling pressures. I do not think her  chest pain is ischemically mediated. Her enzymes are low and flat. It could be related to demand ischemia. Continue medical therapy will be recommended. The sheaths were removed and a TR band was placed on the right wrist to achieve patent hemostasis. The patient left lab in stable condition. The findings were reviewed with Dr. Dorris Carnes.  Patient Profile     63 y.o. 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  Mild ascending aortic aneurysm (4.7 cm, stable in size), no coronary artery disease. Cellulitis L leg.  Assessment & Plan    1. AS of bioprosthetic valve: Technical quality of CT provided limited assessment of the aortic valve biological prosthesis but did note hypoattenuared areas under leaflets that could represents thrombosis.  No evidence of thrombus on TEE.  Suspect elevated gradients due to LVOT obstruction.  Starting on warfarin given TIA as below, can plan to reassess gradients after 3 months anticoagulation  2. Asc Ao aneurysm: Relatively mild and stable in size, unlikely to change decision regarding medical management/TAVR/surgical AVR.  3. TIA: This could be secondary to bioprosthetic aortic valve leaflet thrombosis as above, but could also be secondary to as yet undiagnosed atrial fibrillation.  Extremely frequent PACs and brief runs of atrial tachycardia on telemetry.  Also with PFO.  Starting on warfarin, will bridge with lovenox.  Will plan for 30 day monitor on discharge  4.  Chronic microcytic anemia: ongoing dysfunctional/postmenopausal bleeding. Etiology not yet defined.  This would have implications surrounding chronic anticoagulation. Anemia exaggerates the aortic outflow gradients.  Hgb has been stable at 9.0 on anticoagulation.  5.  Morbid obesity: contributing to higher gradients across the  left ventricular outflow tract and is part of the reason for her dyspnea.  6. LVOT obstruction: Prefer beta-blockers for treatment of hypertension, will increase metoprolol to 25 mg BID given elevated BP.  Avoid excessive diuretics and avoid potent vasodilators. Holding torsemide.    [facility] will sign off.   Medication Recommendations:  Metoprolol 25 mg BID, warfarin per pharmacy with lovenox bridge.  Hold torsemide for now Other recommendations (labs, testing, etc):  30 day cardiac moitor Follow up as an outpatient:  Scheduled for coumadin clinic 10/1.  F/u with Dr Bettina Gavia scheduled for 10/19.    For questions or updates, please contact [place] Please consult [email or web address] for contact info under        Signed, Donato Heinz, MD  06/09/2019, 7:22 AM

## 2019-06-09 NOTE — Discharge Summary (Signed)
Discharge Summary  [name] [id number] DOB: 1955-11-14  PCP: [name], MD  Admit date: 06/03/2019 Discharge date: 06/09/2019  Time spent: 35 minutes  Recommendations for Outpatient Follow-up:  1. Follow-up with your cardiologist 2. Follow-up with your primary care provider 3. Take your medications as prescribed 4. Continue physical therapy 5. Fall precautions  Per cardiology:TIA:This could be secondary to bioprosthetic aortic valve leaflet thrombosis as above, but could also be secondary to as yet undiagnosed atrial fibrillation. Extremely frequent PACs and brief runs of atrial tachycardia on telemetry. Also with PFO.  Starting on warfarin, will bridge with lovenox.  Will plan for 30 day monitor on discharge.  Discharge Diagnoses:  Active Hospital Problems   Diagnosis Date Noted   Transient neurologic deficit 06/03/2019   TIA (transient ischemic attack) 06/04/2019   Anemia 06/03/2019   Chronic diastolic CHF (congestive heart failure) ([place]) 06/03/2019   CAD (coronary artery disease) 06/03/2019   Essential hypertension 06/03/2019   Dysfunctional uterine bleeding 04/22/2018   H/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 02/20/2017   Aortic stenosis 03/23/2013    Resolved Hospital Problems  No resolved problems to display.    Discharge Condition: Stable  Diet recommendation: Heart healthy carb modified diet  Vitals:   06/09/19 0753 06/09/19 1150  BP: (!) 134/50 (!) 105/45  Pulse: 83 82  Resp: 20 16  Temp: 98.1 F (36.7 C)   SpO2: 95% 96%    History of present illness:  [name] an 63 y.o.femalewith medical history significant forlymphedema involving the bilateral lower extremities, hypertension, chronic diastolic CHF, aortic stenosis status post bioprosthetic valve replacement, chronic renal insufficiency, and chronic anemia attributed to dysfunctional uterine bleeding, now presenting to the emergency department after an  episode of transient speech difficulty and left upper extremity numbness and weakness.  Intermittent uterine bleeding on hormonal therapy  Hospital course complicated by adverse reaction to IV vancomycin with severe pruritus resolved after IV Benadryl, IV Solu-Medrol and IV Pepcid.  Completed 6 days of IV Ancef for left lower extremity cellulitis with improvement of symptomatology.  06/09/19: Patient was seen and examined at bedside this morning.  No acute events overnight.  No new complaints.  Left lower extremity erythema warmth and tenderness nearly resolved.  Will need to complete additionally 6 days of p.o. Keflex for left lower extremity cellulitis.    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 No evidenceof thrombuson TEE per cardiology. LDL 71 goal Less than 70 HemoGlobin A1c 7.0 goal less than 7.0 Continue aspirin 81 and pravastatin 40 mg daily Started on warfarin, bridging with Lovenox on 06/07/2019 30-day event monitor on DC  This could be secondary to bioprosthetic aortic valve leaflet thrombosis as above, but could also be secondary to as yet undiagnosed atrial fibrillation. INR 1.6 on 06/09/2019 Repeat INR on Friday 06/11/19 Follow-up with cardiology  Hyperglycemia Not on diabetic medication at home; no prior diagnosis of diabetes HgA1C 7.0 Recommend follow up with PCP to confirm type 2 diabetes with work  up. May need fasting blood glucose and possibly 2-hour glucose  challenge done outpatient.  Moderate aortic stenosis of bioprosthetic valve Completed TEE on 06/07/2019 No evidence of thrombus or mass. Small PFO Per cardiology, technical quality of CT provided limited assessment of the aortic valve biological prosthesisbut did note hypoattenuared areas under leaflets that could represents thrombosis. No evidenceof thrombuson TEE. Reasonable to treat with warfarin x3 months and then reassess valve gradients. Given her TIA, will bridge with lovenox.  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 Patient states she has an appointment at Coumadin clinic tomorrow 06/10/2019, unclear if accurate.  Resolved hypokalemia post repletion Potassium 3.7 on 06/09/2019.  Small PFO as noted on TEE Right-sided pressure mild to moderately elevated  Left lower extremity cellulitis Completed 6 days of IV Ancef Start Keflex 500 mg 3 times daily x7 days Follow-up with your PCP  Possible adverse reaction to IV vancomycin Onset on 06/06/2019 with severe pruritus and no specific rash or edema Treated with IV Benadryl 25 mg 3 times daily x1 day, IV Solu-Medrol 60 mg 1, IV Pepcid. Symptoms have now resolved.  2.02-second pause reported  Personally reviewed twelve-lead EKG done on 06/07/2019 reveals sinus rhythm rate of 90.  No specific ST-T changes. Seen by cardiology  Subclinical hypothyroidism TSH 5.5 Free T4 normal  Ascending aortic aneurysm Mild and stable SBP goal 120  Lymphedema complicated by LLE cellulitis Management as noted above  Leukocytosis, possibly reactive in the setting of recent  IV steroid use Lactic acid 1.7 on 06/08/2019 Procalcitonin less than 0.10 on 06/08/2019  CKD 3 Creatinine appears to be at baseline 1.09 with GFR 54 Avoid nephrotoxins like NSAIDs Follow-up withyourPCP  Chronic diastolic CHF Currently euvolemic Diuretics stopped as recommended by cardiology Follow-up with your cardiologist  Asymptomatic euvolemic hyponatremia Sodium 131 from 134 Follow-up with your PCP  Intermittent uterine bleeding in the setting of post menopausal bleeding diagnosed a year ago Hemoglobin stable at 9.0 Norethandrolone Follow-up with your gynecologist or PCP  Severe iron deficiency anemia, transfused IV iron supplement Iron studies revealed trans sat 4, iron 21, ferritin 11 Hemoglobin 9.1 on 06/06/2019 with MCV 73 Transfused 1 dose of IV Feraheme on 510 mg 1  Morbid obesity BMI 49 Recommend weight loss outpatient with regular physical activity and healthy dieting  Physical debility/ambulatory dysfunction PT assessed and recommended outpatient PT Continue physical therapy Continue fall precautions     Code Status:Full Code.    Antibiotics: keflex 06/04/19>>06/06/19. IV vancomycin 06/06/19>> 06/06/2019 Ancef 06/07/2019>>06/09/19 06/09/19 for DC planning: Keflex 500 mg tid x 7 days    Discharge Exam: BP (!) 105/45 (BP Location: Left Arm)    Pulse 82    Temp 98.1 F (36.7 C) (Oral)    Resp 16    Ht 5\' 2"  (1.575 m)    Wt 125.1 kg    SpO2 96%    BMI 50.44 kg/m   General: 63 y.o. year-old female well developed well nourished in no acute distress.  Alert and oriented x3.  Cardiovascular: Regular rate and rhythm with no rubs or gallops.  No thyromegaly or JVD noted.    Respiratory: Clear to auscultation with no wheezes or rales. Good inspiratory  effort.  Abdomen: Soft nontender nondistended with normal bowel sounds x4 quadrants.  Musculoskeletal: Trace lower extremity edema.  Erythema, warmth and tenderness on left lower extremity nearly  resolved.  Psychiatry: Mood is appropriate for condition and setting  Discharge Instructions You were cared for by a hospitalist during your hospital stay. If you have any questions about your discharge medications or the care you received while you were in the hospital after you are discharged, you can call the unit and asked to speak with the hospitalist on call if the hospitalist that took care of you is not available. Once you are discharged, your primary care physician will handle any further medical issues. Please note that NO REFILLS for any discharge medications will be authorized once you are discharged, as it is imperative that you return to your primary care physician (or establish a relationship with a primary care physician if you do not have one) for your aftercare needs so that they can reassess your need for medications and monitor your lab values.  Discharge Instructions    Ambulatory referral to Physical Therapy   Complete by: As directed    Lymphedema therapy through either PT or OT     Allergies as of 06/09/2019      Reactions   Vancomycin Itching      Medication List    STOP taking these medications   potassium chloride 10 MEQ tablet Commonly known as: KLOR-CON   telmisartan 20 MG tablet Commonly known as: MICARDIS   torsemide 20 MG tablet Commonly known as: DEMADEX     TAKE these medications   aspirin EC 81 MG tablet Take 81 mg by mouth daily.   cephALEXin 500 MG capsule Commonly known as: KEFLEX Take 1 capsule (500 mg total) by mouth every 8 (eight) hours for 7 days.   enoxaparin 120 MG/0.8ML injection Commonly known as: LOVENOX Inject 0.8 mLs (120 mg total) into the skin every 12 (twelve) hours for 4 days.   metoprolol tartrate 25 MG tablet Commonly known as: LOPRESSOR Take 1 tablet (25 mg total) by mouth 2 (two) times daily. What changed:   medication strength  how much to take   norethindrone 5 MG tablet Commonly known as: AYGESTIN Take 5  mg by mouth 2 (two) times daily.   pravastatin 40 MG tablet Commonly known as: PRAVACHOL Take 1 tablet (40 mg total) by mouth daily at 6 PM. What changed:   medication strength  how much to take  when to take this   Sarna lotion Generic drug: camphor-menthol Apply 1 application topically as needed for itching.   warfarin 10 MG tablet Commonly known as: COUMADIN Take 1 tablet (10 mg total) by mouth daily at 6 PM for 10 days.      Allergies  Allergen Reactions   Vancomycin Itching   Follow-up Information    [facility]. Schedule an appointment as soon as possible for a visit in 4 week(s).   Specialty: Neurology Contact information: [place] [phone]       [facility] Follow up.   Specialty: Cardiology Why: [facility] location in [place] for first Coumadin check - 06/10/19 at 2:30pm. Contact information: [address] [place] Follow up.   Specialty: Rehabilitation Why: The outpatient rehab will contact you for the first appointment Contact  information: [place] [id number] [address] [place]       Richardo Priest, MD Follow up.   Specialty: Cardiology Why: See appointment information below. The office will also call you to arrange a 30 day heart monitor to wear to assess your heart rhythm after discharge. Contact information: [place] [address] [phone]        [place]ding, John F. II, MD. Call in 1 day(s).   Specialty: Family Medicine Why: Please call for a post hospital follow-up appointment. Contact information: [place] [address] [phone]        Richardo Priest, MD .   Specialty: Cardiology Contact  information: [address] [place] [address] [phone]            The results of significant diagnostics from this hospitalization (including imaging, microbiology, ancillary and laboratory) are listed below for reference.    Significant Diagnostic Studies: Ct Angio Head W Or Wo Contrast  Result Date: 06/04/2019 CLINICAL DATA:  Slurred speech and left arm weakness. EXAM: CT ANGIOGRAPHY HEAD AND NECK TECHNIQUE: Multidetector CT imaging of the head and neck was performed using the standard protocol during bolus administration of intravenous contrast. Multiplanar CT image reconstructions and MIPs were obtained to evaluate the vascular anatomy. Carotid stenosis measurements (when applicable) are obtained utilizing NASCET criteria, using the distal internal carotid diameter as the denominator. CONTRAST:  142mL OMNIPAQUE IOHEXOL 350 MG/ML SOLN COMPARISON:  Noncontrast head CT from yesterday FINDINGS: CTA NECK FINDINGS Aortic arch: Negative Right carotid system: No atheromatous changes. Vessels are diffusely patent without ulceration. Mild tortuosity at the skull base. Left carotid system: Vessels are smooth and widely patent. No noted atheromatous changes. ICA tortuosity at the skull base Vertebral arteries: No proximal subclavian stenosis. Fenestrated right V4 segment. The vertebral and basilar arteries are smooth and diffusely patent Skeleton: No acute or aggressive finding Other neck: Negative Upper chest: Tracheobronchomalacia. Review of the MIP images confirms the above findings CTA HEAD FINDINGS Anterior circulation: Vessels are smooth and widely patent. Ministry shin at the left A1 2 junction. Negative for aneurysm. Posterior circulation: Vertebral and basilar arteries. No branch occlusion are smooth and diffusely patent or flow limiting stenosis Venous sinuses: Limited opacification in the arterial phase without detectable filling defect Anatomic variants: As above Review of the MIP images  confirms the above findings IMPRESSION: No emergent finding or flow limiting stenosis. Electronically Signed   By: Monte Fantasia M.D.   On: 06/04/2019 04:22   Ct Head Wo Contrast  Result Date: 06/03/2019 CLINICAL DATA:  Slurred speech, left arm weakness and numbness. EXAM: CT HEAD WITHOUT CONTRAST TECHNIQUE: Contiguous axial images were obtained from the base of the skull through the vertex without intravenous contrast. COMPARISON:  None. FINDINGS: Brain: No acute intracranial abnormality. Specifically, no hemorrhage, hydrocephalus, mass lesion, acute infarction, or significant intracranial injury. Vascular: No hyperdense vessel or unexpected calcification. Skull: No acute calvarial abnormality. Sinuses/Orbits: Visualized paranasal sinuses and mastoids clear. Orbital soft tissues unremarkable. Other: None IMPRESSION: No intracranial abnormality. Electronically Signed   By: Rolm Baptise M.D.   On: 06/03/2019 18:41   Ct Angio Neck W Or Wo Contrast  Result Date: 06/04/2019 CLINICAL DATA:  Slurred speech and left arm weakness. EXAM: CT ANGIOGRAPHY HEAD AND NECK TECHNIQUE: Multidetector CT imaging of the head and neck was performed using the standard protocol during bolus administration of intravenous contrast. Multiplanar CT image reconstructions and MIPs were obtained  to evaluate the vascular anatomy. Carotid stenosis measurements (when applicable) are obtained utilizing NASCET criteria, using the distal internal carotid diameter as the denominator. CONTRAST:  114mL OMNIPAQUE IOHEXOL 350 MG/ML SOLN COMPARISON:  Noncontrast head CT from yesterday FINDINGS: CTA NECK FINDINGS Aortic arch: Negative Right carotid system: No atheromatous changes. Vessels are diffusely patent without ulceration. Mild tortuosity at the skull base. Left carotid system: Vessels are smooth and widely patent. No noted atheromatous changes. ICA tortuosity at the skull base Vertebral arteries: No proximal subclavian stenosis. Fenestrated  right V4 segment. The vertebral and basilar arteries are smooth and diffusely patent Skeleton: No acute or aggressive finding Other neck: Negative Upper chest: Tracheobronchomalacia. Review of the MIP images confirms the above findings CTA HEAD FINDINGS Anterior circulation: Vessels are smooth and widely patent. Ministry shin at the left A1 2 junction. Negative for aneurysm. Posterior circulation: Vertebral and basilar arteries. No branch occlusion are smooth and diffusely patent or flow limiting stenosis Venous sinuses: Limited opacification in the arterial phase without detectable filling defect Anatomic variants: As above Review of the MIP images confirms the above findings IMPRESSION: No emergent finding or flow limiting stenosis. Electronically Signed   By: Monte Fantasia M.D.   On: 06/04/2019 04:22   Mr Brain Wo Contrast  Result Date: 06/04/2019 CLINICAL DATA:  TIA, initial exam EXAM: MRI HEAD WITHOUT CONTRAST TECHNIQUE: Multiplanar, multiecho pulse sequences of the brain and surrounding structures were obtained without intravenous contrast. COMPARISON:  CT and CTA from yesterday and earlier today FINDINGS: Brain: No acute infarction, hemorrhage, hydrocephalus, extra-axial collection or mass lesion. Vascular: Normal flow voids. Skull and upper cervical spine: Normal marrow signal. Sinuses/Orbits: Negative. Other: Progressive and overall moderate motion artifact. IMPRESSION: Negative brain MRI with moderate motion degradation. Electronically Signed   By: Monte Fantasia M.D.   On: 06/04/2019 07:42   Ct Coronary Morph W/cta Cor W/score W/ca W/cm &/or Wo/cm  Addendum Date: 05/22/2019   ADDENDUM REPORT: 05/22/2019 22:21 CLINICAL DATA:  64 year old female with h/o AVR with a 23 mm Hancock valve on 03/30/13, now with increased gradients. Coronary angiography in July 2020 showed no significant CAD and 43 mm peak to peak gradient across the valve and echocardiogram suggestive of AVR dysfunction. EXAM: Cardiac  TAVR CT TECHNIQUE: The patient was scanned on a [employer]. A 120 kV retrospective scan was triggered in the descending thoracic aorta at 111 HU's. Gantry rotation speed was 250 msecs and collimation was .6 mm. No beta blockade or nitro were given. The 3D data set was reconstructed in 5% intervals of the R-R cycle. Systolic and diastolic phases were analyzed on a dedicated work station using MPR, MIP and VRT modes. The patient received 80 cc of contrast. FINDINGS: Aortic Valve: 23 mm Hancock valve is seen in the aortic position. Annular ring measures 23 x 23 mm. There is no abnormal valve motion. No dehiscence/perivalvular leak. Leaflet visualization is significantly limited secondary to patient's size (BMI 52). There are two hypo-attenuated areas under the leaflets that possibly represent leaflets thrombosis. Aorta: There is ascending aortic aneurysm with maximum diameter 42 mm. Normal size of the aortic arch and descending aorta. Minimal atherosclerotic plaque, no dissection. Sinotubular Junction: 33 x 33 mm Ascending Thoracic Aorta: 42 x 41 mm Aortic Arch: 25 x 23 mm Descending Thoracic Aorta: 23 x 23 mm No thrombus in the left atrial appendage. Normal size of the pulmonary artery. IMPRESSION: 1. 23 mm Hancock valve is seen in the aortic position with normal size of the  annular ring (23 mm). There is no abnormal valve motion, no dehiscence/perivalvular leak. Unfortunately, secondary to patient's size (BMI 52) and beam harding artifact leaflet visualization is very limited and leaflet opening is not visualized. There are two hypo-attenuated areas under the leaflets that possibly represent leaflets thrombosis. In the presence of symptoms consider TEE for further evaluation. 2. Ascending aortic aneurysm with maximum diameter 42 mm. Annual chest CTA or MRA is recommended. Electronically Signed   By: Ena Dawley   On: 05/22/2019 22:21   Result Date: 05/22/2019 EXAM: OVER-READ INTERPRETATION  CT  CHEST The following report is an over-read performed by radiologist Dr. Vinnie Langton of [facility], [place] on 05/20/2019. This over-read does not include interpretation of cardiac or coronary anatomy or pathology. The coronary calcium score/coronary CTA interpretation by the cardiologist is attached. COMPARISON:  Chest CTA 03/30/2019. FINDINGS: Extracardiac findings will be described separately under dictation for contemporaneously obtained CTA chest, abdomen and pelvis. IMPRESSION: Please see separate dictation for contemporaneously obtained CTA chest, abdomen and pelvis dated 05/20/2019 for full description of relevant extracardiac findings. Electronically Signed: By: Vinnie Langton M.D. On: 05/20/2019 14:28   Ct Angio Chest Aorta W/cm &/or Wo/cm  Result Date: 05/20/2019 CLINICAL DATA:  63 year old female with history of severe aortic valve disease. Preprocedural study prior to potential transcatheter aortic valve replacement (TAVR) procedure. EXAM: CT ANGIOGRAPHY CHEST, ABDOMEN AND PELVIS TECHNIQUE: Multidetector CT imaging through the chest, abdomen and pelvis was performed using the standard protocol during bolus administration of intravenous contrast. Multiplanar reconstructed images and MIPs were obtained and reviewed to evaluate the vascular anatomy. CONTRAST:  115mL OMNIPAQUE IOHEXOL 350 MG/ML SOLN COMPARISON:  CTA of the chest 02/28/2019. FINDINGS: CTA CHEST FINDINGS Cardiovascular: Heart size is mildly enlarged. There is no significant pericardial fluid, thickening or pericardial calcification. Mild aortic atherosclerosis. No definite coronary artery calcifications. Status post median sternotomy for aortic valve replacement with what appears to be a stented bioprosthesis. Mild aneurysmal dilatation of the ascending thoracic aorta (4.5 cm in diameter). Mediastinum/Lymph Nodes: No pathologically enlarged mediastinal or hilar lymph nodes. Esophagus is unremarkable. No axillary lymphadenopathy.  Lungs/Pleura: Patchy areas of mild ground-glass attenuation and interlobular septal thickening in the lungs bilaterally suggestive of very mild interstitial pulmonary edema. No confluent consolidative airspace disease. No pleural effusions. No suspicious appearing pulmonary nodules or masses are noted. Musculoskeletal/Soft Tissues: Status post median sternotomy. There are no aggressive appearing lytic or blastic lesions noted in the visualized portions of the skeleton. CTA ABDOMEN AND PELVIS FINDINGS Hepatobiliary: Liver is enlarged measuring 22.2 cm in craniocaudal span. Diffuse low attenuation throughout the hepatic parenchyma, indicative of severe hepatic steatosis. No definite suspicious cystic or solid hepatic lesions. No intra or extrahepatic biliary ductal dilatation. Status post cholecystectomy. Pancreas: No pancreatic mass. No pancreatic ductal dilatation. No pancreatic or peripancreatic fluid collections or inflammatory changes. Spleen: Unremarkable. Adrenals/Urinary Tract: Low-attenuation lesions in both kidneys, compatible with simple cysts, largest of which is an exophytic lesion in the lateral aspect of the interpolar region of the right kidney measuring 10.8 cm in diameter. No suspicious renal lesions. No hydroureteronephrosis. Urinary bladder is normal in appearance. Bilateral adrenal glands are normal in appearance. Stomach/Bowel: Normal appearance of the stomach. No pathologic dilatation of small bowel or colon. A few scattered colonic diverticulae are noted, without surrounding inflammatory changes to suggest an acute diverticulitis at this time. Normal appendix. Vascular/Lymphatic: Minimal aortic atherosclerosis, without evidence of aneurysm or dissection in the abdominal or pelvic vasculature. Vascular findings and measurements pertinent to potential  TAVR procedure, as detailed below. No abdominal lymphadenopathy. Multiple prominent borderline enlarged and mildly enlarged pelvic lymph nodes are  noted, largest of which is in the left obturator own nodal station measuring 1.7 cm in short axis (axial image 171 of series 15). Reproductive: Uterus and ovaries are unremarkable in appearance. Other: No significant volume of ascites.  No pneumoperitoneum. Musculoskeletal: Levoscoliosis of the lumbar spine convex to the left at the level of L3. There are no aggressive appearing lytic or blastic lesions noted in the visualized portions of the skeleton. VASCULAR MEASUREMENTS PERTINENT TO TAVR: AORTA: Minimal Aortic Diameter-17 x 16 mm Severity of Aortic Calcification-minimal RIGHT PELVIS: Right Common Iliac Artery - Minimal Diameter-11.9 x 10.7 mm Tortuosity-moderate Calcification-minimal Right External Iliac Artery - Minimal Diameter-9.3 x 9.2 mm Tortuosity-moderate Calcification - none Right Common Femoral Artery - Minimal Diameter-9.6 x 9.5 mm Tortuosity-mild Calcification - none LEFT PELVIS: Left Common Iliac Artery - Minimal Diameter-12.1 x 11.1 mm Tortuosity-moderate to severe Calcification - none Left External Iliac Artery - Minimal Diameter-10.2 x 9.4 mm Tortuosity-moderate to severe Calcification - none Left Common Femoral Artery - Minimal Diameter-9.5 x 9.2 mm Tortuosity-mild Calcification-none Review of the MIP images confirms the above findings. IMPRESSION: 1. Vascular findings and measurements pertinent to potential TAVR procedure, as detailed above. 2. Status post median sternotomy for aortic valve replacement with a stented bioprosthesis. 3. Aortic atherosclerosis with mild aneurysmal dilatation of the ascending thoracic aorta (4.5 cm in diameter). Ascending thoracic aortic aneurysm. Recommend semi-annual imaging followup by CTA or MRA and referral to cardiothoracic surgery if not already obtained. This recommendation follows 2010 ACCF/AHA/AATS/ACR/ASA/SCA/SCAI/SIR/STS/SVM Guidelines for the Diagnosis and Management of Patients With Thoracic Aortic Disease. Circulation. 2010; [id number]:[id number]. Aortic  aneurysm NOS (ICD10-I71.9) 4. Mild cardiomegaly with evidence of very mild interstitial pulmonary edema, which may suggest mild congestive heart failure. 5. Multiple prominent borderline enlarged and mildly enlarged pelvic lymph nodes bilaterally (left greater than right). These are nonspecific, but if there is any clinical concern for lymphoproliferative disorder further outpatient clinical evaluation would be suggested, with potential follow-up CT the abdomen and pelvis with contrast in 3-6 months to ensure the stability or resolution of these findings. 6. Hepatomegaly with hepatic steatosis. 7. Mild colonic diverticulosis without evidence of acute diverticulitis at this time. 8. Additional incidental findings, as above. Electronically Signed   By: Vinnie Langton M.D.   On: 05/20/2019 15:07   Ct Angio Abd/pel W/ And/or W/o  Result Date: 05/20/2019 CLINICAL DATA:  63 year old female with history of severe aortic valve disease. Preprocedural study prior to potential transcatheter aortic valve replacement (TAVR) procedure. EXAM: CT ANGIOGRAPHY CHEST, ABDOMEN AND PELVIS TECHNIQUE: Multidetector CT imaging through the chest, abdomen and pelvis was performed using the standard protocol during bolus administration of intravenous contrast. Multiplanar reconstructed images and MIPs were obtained and reviewed to evaluate the vascular anatomy. CONTRAST:  140mL OMNIPAQUE IOHEXOL 350 MG/ML SOLN COMPARISON:  CTA of the chest 02/28/2019. FINDINGS: CTA CHEST FINDINGS Cardiovascular: Heart size is mildly enlarged. There is no significant pericardial fluid, thickening or pericardial calcification. Mild aortic atherosclerosis. No definite coronary artery calcifications. Status post median sternotomy for aortic valve replacement with what appears to be a stented bioprosthesis. Mild aneurysmal dilatation of the ascending thoracic aorta (4.5 cm in diameter). Mediastinum/Lymph Nodes: No pathologically enlarged mediastinal or hilar  lymph nodes. Esophagus is unremarkable. No axillary lymphadenopathy. Lungs/Pleura: Patchy areas of mild ground-glass attenuation and interlobular septal thickening in the lungs bilaterally suggestive of very mild interstitial pulmonary edema. No confluent consolidative  airspace disease. No pleural effusions. No suspicious appearing pulmonary nodules or masses are noted. Musculoskeletal/Soft Tissues: Status post median sternotomy. There are no aggressive appearing lytic or blastic lesions noted in the visualized portions of the skeleton. CTA ABDOMEN AND PELVIS FINDINGS Hepatobiliary: Liver is enlarged measuring 22.2 cm in craniocaudal span. Diffuse low attenuation throughout the hepatic parenchyma, indicative of severe hepatic steatosis. No definite suspicious cystic or solid hepatic lesions. No intra or extrahepatic biliary ductal dilatation. Status post cholecystectomy. Pancreas: No pancreatic mass. No pancreatic ductal dilatation. No pancreatic or peripancreatic fluid collections or inflammatory changes. Spleen: Unremarkable. Adrenals/Urinary Tract: Low-attenuation lesions in both kidneys, compatible with simple cysts, largest of which is an exophytic lesion in the lateral aspect of the interpolar region of the right kidney measuring 10.8 cm in diameter. No suspicious renal lesions. No hydroureteronephrosis. Urinary bladder is normal in appearance. Bilateral adrenal glands are normal in appearance. Stomach/Bowel: Normal appearance of the stomach. No pathologic dilatation of small bowel or colon. A few scattered colonic diverticulae are noted, without surrounding inflammatory changes to suggest an acute diverticulitis at this time. Normal appendix. Vascular/Lymphatic: Minimal aortic atherosclerosis, without evidence of aneurysm or dissection in the abdominal or pelvic vasculature. Vascular findings and measurements pertinent to potential TAVR procedure, as detailed below. No abdominal lymphadenopathy. Multiple  prominent borderline enlarged and mildly enlarged pelvic lymph nodes are noted, largest of which is in the left obturator own nodal station measuring 1.7 cm in short axis (axial image 171 of series 15). Reproductive: Uterus and ovaries are unremarkable in appearance. Other: No significant volume of ascites.  No pneumoperitoneum. Musculoskeletal: Levoscoliosis of the lumbar spine convex to the left at the level of L3. There are no aggressive appearing lytic or blastic lesions noted in the visualized portions of the skeleton. VASCULAR MEASUREMENTS PERTINENT TO TAVR: AORTA: Minimal Aortic Diameter-17 x 16 mm Severity of Aortic Calcification-minimal RIGHT PELVIS: Right Common Iliac Artery - Minimal Diameter-11.9 x 10.7 mm Tortuosity-moderate Calcification-minimal Right External Iliac Artery - Minimal Diameter-9.3 x 9.2 mm Tortuosity-moderate Calcification - none Right Common Femoral Artery - Minimal Diameter-9.6 x 9.5 mm Tortuosity-mild Calcification - none LEFT PELVIS: Left Common Iliac Artery - Minimal Diameter-12.1 x 11.1 mm Tortuosity-moderate to severe Calcification - none Left External Iliac Artery - Minimal Diameter-10.2 x 9.4 mm Tortuosity-moderate to severe Calcification - none Left Common Femoral Artery - Minimal Diameter-9.5 x 9.2 mm Tortuosity-mild Calcification-none Review of the MIP images confirms the above findings. IMPRESSION: 1. Vascular findings and measurements pertinent to potential TAVR procedure, as detailed above. 2. Status post median sternotomy for aortic valve replacement with a stented bioprosthesis. 3. Aortic atherosclerosis with mild aneurysmal dilatation of the ascending thoracic aorta (4.5 cm in diameter). Ascending thoracic aortic aneurysm. Recommend semi-annual imaging followup by CTA or MRA and referral to cardiothoracic surgery if not already obtained. This recommendation follows 2010 ACCF/AHA/AATS/ACR/ASA/SCA/SCAI/SIR/STS/SVM Guidelines for the Diagnosis and Management of Patients  With Thoracic Aortic Disease. Circulation. 2010; [id number]. Aortic aneurysm NOS (ICD10-I71.9) 4. Mild cardiomegaly with evidence of very mild interstitial pulmonary edema, which may suggest mild congestive heart failure. 5. Multiple prominent borderline enlarged and mildly enlarged pelvic lymph nodes bilaterally (left greater than right). These are nonspecific, but if there is any clinical concern for lymphoproliferative disorder further outpatient clinical evaluation would be suggested, with potential follow-up CT the abdomen and pelvis with contrast in 3-6 months to ensure the stability or resolution of these findings. 6. Hepatomegaly with hepatic steatosis. 7. Mild colonic diverticulosis without evidence of acute diverticulitis at  this time. 8. Additional incidental findings, as above. Electronically Signed   By: Vinnie Langton M.D.   On: 05/20/2019 15:07    Microbiology: Recent Results (from the past 240 hour(s))  SARS CORONAVIRUS 2 (TAT 6-24 HRS) Nasopharyngeal Nasopharyngeal Swab     Status: None   Collection Time: 06/03/19 10:31 PM   Specimen: Nasopharyngeal Swab  Result Value Ref Range Status   SARS Coronavirus 2 NEGATIVE NEGATIVE Final    Comment: (NOTE) SARS-CoV-2 target nucleic acids are NOT DETECTED. The SARS-CoV-2 RNA is generally detectable in upper and lower respiratory specimens during the acute phase of infection. Negative results do not preclude SARS-CoV-2 infection, do not rule out co-infections with other pathogens, and should not be used as the sole basis for treatment or other patient management decisions. Negative results must be combined with clinical observations, patient history, and epidemiological information. The expected result is Negative. Fact Sheet for Patients: SugarRoll.be Fact Sheet for Healthcare Providers: https://www.woods-mathews.com/ This test is not yet approved or cleared by the [place] FDA and   has been authorized for detection and/or diagnosis of SARS-CoV-2 by FDA under an Emergency Use Authorization (EUA). This EUA will remain  in effect (meaning this test can be used) for the duration of the COVID-19 declaration under Section 56 4(b)(1) of the Act, 21 U.S.C. section 360bbb-3(b)(1), unless the authorization is terminated or revoked sooner. Performed at [facility], [place] [address] [place], [place] [address]   MRSA PCR Screening     Status: None   Collection Time: 06/06/19 11:05 AM   Specimen: Nasal Mucosa; Nasopharyngeal  Result Value Ref Range Status   MRSA by PCR NEGATIVE NEGATIVE Final    Comment:        The GeneXpert MRSA Assay (FDA approved for NASAL specimens only), is one component of a comprehensive MRSA colonization surveillance program. It is not intended to diagnose MRSA infection nor to guide or monitor treatment for MRSA infections. Performed at [facility], [place] [address], [place],  [address]      Labs: Basic Metabolic Panel: Recent Labs  Lab 06/05/19 0342 06/06/19 0518 06/07/19 0524 06/08/19 0403 06/09/19 0427  NA 135 132* 135 134* 131*  K 3.1* 3.2* 4.1 3.4* 3.7  CL 98 93* 97* 96* 95*  CO2 27 26 28 27 26   GLUCOSE 117* 128* 194* 149* 138*  BUN 9 10 10 11 12   CREATININE 1.06* 0.98 1.07* 1.11* 1.09*  CALCIUM 8.4* 8.3* 8.5* 8.7* 8.7*  MG  --  2.1  --  2.2  --    Liver Function Tests: Recent Labs  Lab 06/03/19 1618  AST 23  ALT 18  ALKPHOS 41  BILITOT 0.4  PROT 7.2  ALBUMIN 2.9*   No results for input(s): LIPASE, AMYLASE in the last 168 hours. No results for input(s): AMMONIA in the last 168 hours. CBC: Recent Labs  Lab 06/03/19 1618  06/05/19 0342 06/06/19 0518 06/07/19 0357 06/08/19 0403 06/09/19 0427  WBC 11.9*  --  15.1* 15.6* 14.8* 17.3* 17.9*  NEUTROABS 8.7*  --   --   --   --  12.6* 12.7*  HGB 8.4*   < > 8.6* 9.1* 9.0* 9.1* 9.0*  HCT 31.6*   < > 31.6* 32.5*   32.0* 31.1* 34.2* 32.2*  MCV  77.1*  --  75.1* 73.4* 73.7* 76.3* 75.6*  PLT 344  --  371 413* 375 403* 358   < > = values in this interval not displayed.  Cardiac Enzymes: No results for input(s): CKTOTAL, CKMB, CKMBINDEX, TROPONINI in the last 168 hours. BNP: BNP (last 3 results) Recent Labs    03/30/19 2350  BNP 95.3    ProBNP (last 3 results) Recent Labs    06/26/18 1352 08/14/18 1058 02/12/19 1116  PROBNP 698* 876* 640*    CBG: Recent Labs  Lab 06/04/19 0105 06/08/19 2209 06/09/19 0617 06/09/19 1149  GLUCAP 125* 179* 123* 109*       Signed:  Kayleen Memos, MD [facility] 06/09/2019, 2:32 PM

## 2019-06-09 NOTE — TOC Transition Note (Addendum)
Transition of Care [facility]) - CM/SW Discharge Note   Patient Details  Name: [name] MRN: [id number] Date of Birth: 22-Feb-1956  [facility] CM/SW Contact:  Pollie Friar, RN Phone Number: 06/09/2019, 3:51 PM   Clinical Narrative:    CM spoke to [employer] that provided her bipap and she will have to talk with [employer] about covering the machine again. She wasn't using it 4 hours a night and it seems the information wasn't getting set to [employer] properly. Pt is aware and CM provided her number to [employer] so she can follow up.  CM check with her outpatient pharmacy and they have her lovenox in stock. [facility] rehab will contact her for first visit.  Spouse will provide transport home.   Final next level of care: OP Rehab Barriers to Discharge: No Barriers Identified   Patient Goals and CMS Choice        Discharge Placement                       Discharge Plan and Services                                     Social Determinants of Health (SDOH) Interventions     Readmission Risk Interventions No flowsheet data found.

## 2019-06-11 ENCOUNTER — Ambulatory Visit (INDEPENDENT_AMBULATORY_CARE_PROVIDER_SITE_OTHER): Payer: BC Managed Care – PPO | Admitting: Pharmacist

## 2019-06-11 ENCOUNTER — Other Ambulatory Visit: Payer: Self-pay

## 2019-06-11 DIAGNOSIS — Z7901 Long term (current) use of anticoagulants: Secondary | ICD-10-CM

## 2019-06-11 DIAGNOSIS — Z954 Presence of other heart-valve replacement: Secondary | ICD-10-CM

## 2019-06-11 DIAGNOSIS — G459 Transient cerebral ischemic attack, unspecified: Secondary | ICD-10-CM | POA: Diagnosis not present

## 2019-06-11 HISTORY — DX: Long term (current) use of anticoagulants: Z79.01

## 2019-06-11 LAB — PROTIME-INR
INR: >10 (ref 0.8–1.2)
Prothrombin Time: >120 s — ABNORMAL HIGH (ref 9.1–12.0)

## 2019-06-11 LAB — POCT INR: INR: 8 — AB (ref 2.0–3.0)

## 2019-06-11 NOTE — Patient Instructions (Addendum)
STOP lovenox (enoxaparin), HOLD warfarin for now. Next INR on Monday Call coumadin clinic at [phone] to notify changes in health or medication.

## 2019-06-14 ENCOUNTER — Other Ambulatory Visit: Payer: Self-pay

## 2019-06-14 ENCOUNTER — Ambulatory Visit (INDEPENDENT_AMBULATORY_CARE_PROVIDER_SITE_OTHER): Payer: BC Managed Care – PPO | Admitting: *Deleted

## 2019-06-14 ENCOUNTER — Telehealth: Payer: Self-pay | Admitting: Cardiology

## 2019-06-14 DIAGNOSIS — Z954 Presence of other heart-valve replacement: Secondary | ICD-10-CM

## 2019-06-14 DIAGNOSIS — Z7901 Long term (current) use of anticoagulants: Secondary | ICD-10-CM

## 2019-06-14 DIAGNOSIS — G459 Transient cerebral ischemic attack, unspecified: Secondary | ICD-10-CM | POA: Diagnosis not present

## 2019-06-14 LAB — PROTIME-INR
INR: >10 (ref 0.9–1.2)
INR: 8.7 (ref 0.8–1.2)
Prothrombin Time: >120 s — ABNORMAL HIGH (ref 9.1–12.0)
Prothrombin Time: 70.3 seconds — ABNORMAL HIGH (ref 11.4–15.2)

## 2019-06-14 LAB — POCT INR: INR: 8 — AB (ref 2.0–3.0)

## 2019-06-14 MED ORDER — PHYTONADIONE 5 MG PO TABS: 2.5000 mg | ORAL_TABLET | Freq: Once | ORAL | 0 refills | Status: DC

## 2019-06-14 NOTE — Patient Instructions (Addendum)
Description   No active bleeding , no increased bruises. Bleeding precautions discussed and instructions given to seek medical attention for any bleeding. Pt's INR was was greater then 8 and sent pt to lab-reading showed INR greater then 10. Sent blood to [facility] for second verification and showed INR was 8.7. Called pt and instructed her to stop lovenox and hold Coumadin until she comes in for appointment to get her INR checked on 06/17/2019.

## 2019-06-14 NOTE — Telephone Encounter (Signed)
As the APP on call, I received a call from [place] at [facility] lab with critical lab result-INR 8.7.  Notes reviewed and INR has been greater than 10.  Plan is to hold Coumadin and allow INR to drift down.  I called the patient and she indeed is not taking any Coumadin.  She denies any type of bleeding.  Encouraged her to eat some more greens, foods with vitamin K.  Reviewed that if she develops bleeding, she needs to go to the hospital.  She verbalizes understanding. I will route this note to the Coumadin clinic.

## 2019-06-15 NOTE — Progress Notes (Signed)
POC greater then 8- sent patient to lab and instructed her not to take any Coumadin or Lovenox until we call her and give her further instructions. Pt had recent hospital admission with a TIA, Spoke with Pharm D will hold off on Vitamin K.

## 2019-06-15 NOTE — Telephone Encounter (Signed)
See coumadin clinic note from 10-5 for details

## 2019-06-17 ENCOUNTER — Other Ambulatory Visit: Payer: Self-pay

## 2019-06-17 ENCOUNTER — Ambulatory Visit (INDEPENDENT_AMBULATORY_CARE_PROVIDER_SITE_OTHER): Payer: BC Managed Care – PPO | Admitting: *Deleted

## 2019-06-17 DIAGNOSIS — Z7901 Long term (current) use of anticoagulants: Secondary | ICD-10-CM | POA: Diagnosis not present

## 2019-06-17 DIAGNOSIS — G459 Transient cerebral ischemic attack, unspecified: Secondary | ICD-10-CM | POA: Diagnosis not present

## 2019-06-17 DIAGNOSIS — Z954 Presence of other heart-valve replacement: Secondary | ICD-10-CM | POA: Diagnosis not present

## 2019-06-17 LAB — POCT INR: INR: 7.1 — AB (ref 2.0–3.0)

## 2019-06-17 NOTE — Patient Instructions (Addendum)
Description   Do not take any Coumadin until we give you instructions at your next appt on 06/21/2019. Recheck INR on Monday. [facility] [phone]

## 2019-06-18 ENCOUNTER — Telehealth: Payer: Self-pay | Admitting: Cardiology

## 2019-06-18 NOTE — Telephone Encounter (Signed)
I did not send the prescription

## 2019-06-18 NOTE — Telephone Encounter (Signed)
Wants to discuss her coumadin levels?

## 2019-06-18 NOTE — Telephone Encounter (Signed)
Returned call to pt, she states Dr Bettina Gavia sent in a prescription for Vitamin K to take 1/2 tablet. I do not see any documentation of this, Vitamin K is not on patient's medication list either. Her INR has been high however we have not given her vitamin K due to TIA and thrombus diagnosed while she was in the hospital. Her INR yesterday was 7.1 and she was advised to continue holding warfarin until her INR check on Monday at [facility]. She was previously advised to eat extra greens as well.  Will route for clarification -pt states her prescription has Dr Joya Gaskins name on it, but I am unsure who ordered Vitamin K - there is no prescription or documentation in Epic, pt was seen in Coumadin clinic yesterday and we did not send this in.  Advised pt she does not need to take the Vitamin K unless she experiences any bleeding which she currently denies.

## 2019-06-18 NOTE — Telephone Encounter (Signed)
Please advise. Thanks.  

## 2019-06-21 ENCOUNTER — Ambulatory Visit (INDEPENDENT_AMBULATORY_CARE_PROVIDER_SITE_OTHER): Payer: BC Managed Care – PPO | Admitting: Pharmacist Clinician (PhC)/ Clinical Pharmacy Specialist

## 2019-06-21 ENCOUNTER — Encounter: Payer: Self-pay | Admitting: Pulmonary Disease

## 2019-06-21 ENCOUNTER — Ambulatory Visit (INDEPENDENT_AMBULATORY_CARE_PROVIDER_SITE_OTHER): Payer: BC Managed Care – PPO | Admitting: Pulmonary Disease

## 2019-06-21 ENCOUNTER — Other Ambulatory Visit: Payer: Self-pay

## 2019-06-21 VITALS — BP 116/64 | HR 90 | Temp 97.2°F | Ht 62.0 in | Wt 285.0 lb

## 2019-06-21 DIAGNOSIS — Z954 Presence of other heart-valve replacement: Secondary | ICD-10-CM

## 2019-06-21 DIAGNOSIS — G4733 Obstructive sleep apnea (adult) (pediatric): Secondary | ICD-10-CM | POA: Diagnosis not present

## 2019-06-21 DIAGNOSIS — G459 Transient cerebral ischemic attack, unspecified: Secondary | ICD-10-CM | POA: Diagnosis not present

## 2019-06-21 DIAGNOSIS — Z9989 Dependence on other enabling machines and devices: Secondary | ICD-10-CM

## 2019-06-21 DIAGNOSIS — Z7901 Long term (current) use of anticoagulants: Secondary | ICD-10-CM

## 2019-06-21 DIAGNOSIS — R0602 Shortness of breath: Secondary | ICD-10-CM

## 2019-06-21 LAB — POCT INR: INR: 2.2 (ref 2.0–3.0)

## 2019-06-21 MED ORDER — RAMELTEON 8 MG PO TABS: 8.0000 mg | ORAL_TABLET | Freq: Every day | ORAL | 1 refills | Status: DC

## 2019-06-21 MED ORDER — WARFARIN SODIUM 2.5 MG PO TABS: 2.5000 mg | ORAL_TABLET | Freq: Every day | ORAL | 3 refills | Status: DC

## 2019-06-21 NOTE — Progress Notes (Signed)
Subjective:    Patient ID: [name], female    DOB: 20-Jun-1956, 63 y.o.   MRN: [id number]  Patient with a background history of obstructive sleep apnea  Sleep study from January 2020 did reveal severe obstructive sleep apnea apnea requiring BiPAP of 27/22  Has been having significant issues with sleep onset and sleep maintenance insomnia Sometimes she has no difficulty falling asleep but does wake up after about an hour to an hour and a half  She is not very active Has multiple comorbidities  Sedentary lifestyle Does have lymphedema  Usually tries to go to bed at 1130, takes about 15 minutes to fall asleep, wakes up after about every 1 to 11/2 hours, gets up finally about 5 AM  Weight has been up and down  Morbidly obese  Other health issues include hypertension, history of heart failure History of stroke, valvular stenosis for which she had surgery about 6 years ago  It was following the surgery 6 years ago that she started having issues with ability to stay asleep  It is unclear at the present time whether she has been using a BiPAP on a regular basis DME company is [employer]  Past Medical History:  Diagnosis Date  . Aortic stenosis 03/23/2013   Overview:  02/18/13 TTE EF >55%. Critical AS with mean Ao valve gradient of 82 mm Hg. No AI. No MR, PR, mild TR. Estimated RVSP 30 mm Hg.  . Bicuspid aortic valve   . Edema of both legs 02/20/2017  . 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 Heart failure ([place])   . HTN (hypertension) 03/23/2013  . 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 Morbid obesity ([place]) 02/20/2017  . Sleep apnea    Social History   Socioeconomic History  . Marital status: Married    Spouse name: Not on file  . Number of children: Not on file  . Years of education: Not on file  . Highest education level: Not on file  Occupational History  . Not on file  Social Needs  . Financial resource strain: Not on file  . Food insecurity    Worry: Not  on file    Inability: Not on file  . Transportation needs    Medical: Not on file    Non-medical: Not on file  Tobacco Use  . Smoking status: Never Smoker  . Smokeless tobacco: Never Used  Substance and Sexual Activity  . Alcohol use: No    Frequency: Never  . Drug use: No  . Sexual activity: Not Currently  Lifestyle  . Physical activity    Days per week: Not on file    Minutes per session: Not on file  . Stress: Not on file  Relationships  . Social Herbalist on phone: Not on file    Gets together: Not on file    Attends religious service: Not on file    Active member of club or organization: Not on file    Attends meetings of clubs or organizations: Not on file    Relationship status: Not on file  . Intimate partner violence    Fear of current or ex partner: Not on file    Emotionally abused: Not on file    Physically abused: Not on file    Forced sexual activity: Not on file  Other Topics Concern  . Not on file  Social History Narrative  . Not on file   Family History  Problem  Relation Age of Onset  . Parkinson's disease Father   . Heart attack Mother    Review of Systems  Constitutional: Negative for activity change.  HENT: Negative.   Eyes: Negative.   Respiratory: Positive for apnea.   Cardiovascular: Positive for leg swelling. Negative for chest pain.  Gastrointestinal: Negative.  Negative for abdominal distention and abdominal pain.  Endocrine: Negative.   Genitourinary: Negative.   Psychiatric/Behavioral: Positive for sleep disturbance.      Objective:   Physical Exam Constitutional:      General: She is not in acute distress.    Appearance: She is obese. She is not ill-appearing.  HENT:     Head: Normocephalic and atraumatic.     Nose: No congestion or rhinorrhea.     Mouth/Throat:     Mouth: Mucous membranes are moist.     Pharynx: No oropharyngeal exudate or posterior oropharyngeal erythema.  Eyes:     Extraocular Movements:  Extraocular movements intact.     Pupils: Pupils are equal, round, and reactive to light.  Neck:     Musculoskeletal: Normal range of motion and neck supple. No neck rigidity.  Cardiovascular:     Rate and Rhythm: Normal rate and regular rhythm.     Heart sounds: No murmur.  Pulmonary:     Effort: Pulmonary effort is normal. No respiratory distress.     Breath sounds: No wheezing.  Musculoskeletal: Normal range of motion.        General: Swelling present.  Lymphadenopathy:     Cervical: No cervical adenopathy.  Skin:    General: Skin is warm.     Coloration: Skin is not jaundiced or pale.  Neurological:     General: No focal deficit present.     Mental Status: She is alert.     Cranial Nerves: No cranial nerve deficit.     Sensory: No sensory deficit.  Psychiatric:        Mood and Affect: Mood normal.    Vitals:   06/21/19 1152 06/21/19 1153  BP:  116/64  Pulse:  90  Temp: (!) 97.2 F (36.2 C)   SpO2:  96%   Results of the Epworth flowsheet 06/21/2019  Sitting and reading 2  Watching TV 2  Sitting, inactive in a public place (e.g. a theatre or a meeting) 1  As a passenger in a car for an hour without a break 2  Lying down to rest in the afternoon when circumstances permit 1  Sitting and talking to someone 1  Sitting quietly after a lunch without alcohol 2  In a car, while stopped for a few minutes in traffic 1  Total score 12    Sleep study from January 2020 reviewed showing pressure requirement of 27/22 for severe obstructive sleep apnea      Assessment & Plan:  .  Severe obstructive sleep apnea  .  Severe deconditioning  .  Superobesity  .  Likely obesity hypoventilation  .  Sleep onset and maintenance insomnia   Plan: Pathophysiology of sleep disordered breathing discussed with the patient  Treatment options for sleep disordered breathing discussed with patient  Importance of compliance with BiPAP therapy discussed with the patient  Increasing  activity during the day will be vital to getting better sleep at night as well  Avoiding daytime naps as possible  Compliance with BiPAP is important  I will see her back in the office in about 3 months  We will contact [employer] to get  some download  Start on ramelteon 8 mg nightly for insomnia  It will be challenging to get consolidated sleep at night if he is not very active during the day and she takes multiple naps during the day

## 2019-06-21 NOTE — Patient Instructions (Signed)
Obstructive sleep apnea Insomnia  We will try a sleep aid Increase activity during the day Avoid daytime naps as best as you can  Increase weight loss efforts  Try and use your machine every night  We will try and get a download from the machine in a few weeks  We will follow-up in 6 to 8 weeks

## 2019-06-24 NOTE — Progress Notes (Signed)
Cardiology Office Note:    Date:  06/28/2019   ID:  [name], DOB 1955-11-24, MRN [id number]  PCP:  Martha Sheriff, MD  Cardiologist:  Martha More, MD    Referring MD: Martha Sheriff, MD    ASSESSMENT:    1. H/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   2. TIA (transient ischemic attack)   3. Long term (current) use of anticoagulants   4. Obstructive hypertrophic cardiomyopathy ([place])   5. Hypertensive heart disease with heart failure (HCC)   6. Other iron deficiency anemia   7. PAT (paroxysmal atrial tachycardia) ([place])   8. OSA (obstructive sleep apnea)   9. PFO (patent foramen ovale)    PLAN:    In order of problems listed above:  1. 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  Managed our warfarin clinic 2. He has had no recurrence continue anticoagulant 3. I would continue her anticoagulants at minimum 3 to 6 months before considering elective procedures 4. Continue beta-blocker and keep her diuretic to a minimum dose 5. Stable continue current treatment avoid calcium channel blocker BP is at target 6. Improved received IV iron 7. 30-day event monitor screening for atrial fibrillation 8. She will follow up with the [facility] pulmonary for management of her insomnia and sleep apnea 9. No recommendation was made in hospital for closure and felt to be unrelated to her TIA   Next appointment: 3 months   Medication Adjustments/Labs and Tests Ordered: Current medicines are reviewed at length with the patient today.  Concerns regarding medicines are outlined above.  No orders of the defined types were placed in this encounter.  No orders of the defined types were placed in this encounter.   Chief Complaint  Patient presents with  . Follow-up    after TIA and now is anticoagulated    History of Present Illness:    [name] is a 63 y.o. female with a hx of severe symptomatic aortic stenosis with heart  failure and underwent 23 mm [place] SAVR on 03/30/13 Dr Clementeen Graham at [facility] , hypertension and heart failure. Recent TEE showed dynamic LVOT obstruction and normal AVR function. She was  diagnosed with  severe obstructive and central sleep apnea with nocturnal hypoxemia nadir 59% and initiated on CPAP which was changed to BiPAP.  Her other problems include nonhealing leg ulcers and dysfunctional uterine bleeding pending D&C.  She was admitted to [facility] with chest pain 03/30/2019.  Coronary angiography was normal.  She underwent left and right heart catheterization showing elevated left atrial pressure and increased gradient across her valve.  She previously underwent transesophageal echocardiogram and was found to have dynamic LV outflow tract obstruction. She was last seen by me 04/13/2019. She was admitted to [facility] 06/09/2019 with TIA underwent evaluation with transesophageal echocardiogram showing normal AVR function but TAVR protocol cardiac CTA implied leaflet thrombosis despite her severe anemia with dysfunctional uterine bleeding she was anticoagulated with warfarin.  Her hospital telemetry showed frequent PACs and brief runs of atrial tachycardia she was also noted to have a patent foramen ovale.  She is to wear a 30-day event monitor as an outpatient.  Her hemoglobin on hospital stable 9.0 and received parenteral iron and diuretic was held.   Ref Range & Units 7d ago 11d ago 2wk ago  INR 2.0 - 3.0 2.2  7.1Abnormal   8.7High Panic  R, CM     Compliance with  diet, lifestyle and medications: Yes  This became a very complex visit I spent [name] than 25 minutes face-to-face with the patient and her husband.  She had multiple issues cardiac and noncardiac affecting her cardiology care and required shared decision making.  She did not quite understand that she had findings of leaflet thrombosis and I reviewed the results of her cardiac CTA and TEE with her.  Soon after discharge she  developed worsened edema wound leakage in the left lower extremities with tense edema is back on diuretic.  Her weights are down 21 pounds.  She was displeased with her sleep apnea care and now is taking care of by [facility].  She is contemplating extensive cosmetic dental procedures and I asked her to defer for the time being.  She remains in touch with her GYN physician in [place] regarding dysfunctional uterine bleeding and need for D&C.  Overall she is very frustrated she cannot sleep at night she is very limited in mobility because of obesity and wound drainage but is not having shortness of breath chest pain palpitation or syncope.  She understands the need for cardiac event monitor regarding atrial fibrillation.  Having no bleeding complication of her anticoagulant is managed in our warfarin clinic Past Medical History:  Diagnosis Date  . Aortic stenosis 03/23/2013   Overview:  02/18/13 TTE EF >55%. Critical AS with mean Ao valve gradient of 82 mm Hg. No AI. No MR, PR, mild TR. Estimated RVSP 30 mm Hg.  . Bicuspid aortic valve   . Edema of both legs 02/20/2017  . 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 Heart failure ([place])   . HTN (hypertension) 03/23/2013  . 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 Morbid obesity ([place]) 02/20/2017  . Sleep apnea     Past Surgical History:  Procedure Laterality Date  . BUBBLE STUDY  06/07/2019   Procedure: BUBBLE STUDY;  Surgeon: Buford Dresser, MD;  Location: [place];  Service: Cardiovascular;;  . CHOLECYSTECTOMY  1983  . RIGHT/LEFT HEART CATH AND CORONARY ANGIOGRAPHY N/A 04/01/2019   Procedure: RIGHT/LEFT HEART CATH AND CORONARY ANGIOGRAPHY;  Surgeon: Lorretta Harp, MD;  Location: [place] CV LAB;  Service: Cardiovascular;  Laterality: N/A;  . TEE WITHOUT CARDIOVERSION N/A 07/01/2018   Procedure: TRANSESOPHAGEAL ECHOCARDIOGRAM (TEE);  Surgeon: Buford Dresser, MD;  Location: [facility] ENDOSCOPY;  Service: Cardiovascular;   Laterality: N/A;  . TEE WITHOUT CARDIOVERSION N/A 06/07/2019   Procedure: TRANSESOPHAGEAL ECHOCARDIOGRAM (TEE);  Surgeon: Buford Dresser, MD;  Location: [facility] ENDOSCOPY;  Service: Cardiovascular;  Laterality: N/A;  . TISSUE AORTIC VALVE REPLACEMENT      Current Medications: Current Meds  Medication Sig  . camphor-menthol (SARNA) lotion Apply 1 application topically as needed for itching.  . cephALEXin (KEFLEX) 500 MG capsule Take 500 mg by mouth every 8 (eight) hours.  . furosemide (LASIX) 40 MG tablet Take 40 mg by mouth every morning.  . metoprolol tartrate (LOPRESSOR) 25 MG tablet Take 1 tablet (25 mg total) by mouth 2 (two) times daily.  . norethindrone (AYGESTIN) 5 MG tablet Take 5 mg by mouth 2 (two) times daily.   . potassium chloride (KLOR-CON) 10 MEQ tablet Take 10 mEq by mouth daily.  . pravastatin (PRAVACHOL) 40 MG tablet Take 1 tablet (40 mg total) by mouth daily at 6 PM.  . ramelteon (ROZEREM) 8 MG tablet Take 1 tablet (8 mg total) by mouth at bedtime.  Marland Kitchen warfarin (COUMADIN) 2.5 MG tablet Take 1 tablet (2.5 mg total) by  mouth daily.  . [DISCONTINUED] aspirin EC 81 MG tablet Take 81 mg by mouth daily.     Allergies:   Vancomycin   Social History   Socioeconomic History  . Marital status: Married    Spouse name: Not on file  . Number of children: Not on file  . Years of education: Not on file  . Highest education level: Not on file  Occupational History  . Not on file  Social Needs  . Financial resource strain: Not on file  . Food insecurity    Worry: Not on file    Inability: Not on file  . Transportation needs    Medical: Not on file    Non-medical: Not on file  Tobacco Use  . Smoking status: Never Smoker  . Smokeless tobacco: Never Used  Substance and Sexual Activity  . Alcohol use: No    Frequency: Never  . Drug use: No  . Sexual activity: Not Currently  Lifestyle  . Physical activity    Days per week: Not on file    Minutes per session: Not on  file  . Stress: Not on file  Relationships  . Social Herbalist on phone: Not on file    Gets together: Not on file    Attends religious service: Not on file    Active member of club or organization: Not on file    Attends meetings of clubs or organizations: Not on file    Relationship status: Not on file  Other Topics Concern  . Not on file  Social History Narrative  . Not on file     Family History: The patient's family history includes Heart attack in her mother; Parkinson's disease in her father. ROS:   Please see the history of present illness.    All other systems reviewed and are negative.  EKGs/Labs/Other Studies Reviewed:    The following studies were reviewed today:  EKG:  EKG reviewed from hospital she did not have atrial fibrillation  TEE 06/07/2019:  1. Normal appearance of bioprosthetic aortic valve. Gradients consistent with prosthetic valve size. On this study, accelerated flow seen again across LVOT. As she received MAC with propofol (vs. Conscious sedation on last TEE), overall LVOT gradient  appeared lower than prior study. No evidence of thrombus. She did have a small PFO again visualized, and her bubble study was rapidly positive with inspiration. She snored loudly throughout procedure and needed jaw thrust/blowby O2 in addition to nasal  cannula to maintain saturations. Right sided pressures also moderately elevated.  2. Left ventricular ejection fraction, by visual estimation, is 65 to 70%. The left ventricle has hyperdynamic function. There is severely increased left ventricular hypertrophy.  3. Left ventricular diastolic function could not be evaluated pattern of LV diastolic filling.  4. Global right ventricle has normal systolic function.The right ventricular size is normal. No increase in right ventricular wall thickness.  5. Left atrial size was not assessed.  6. Right atrial size was not assessed.  7. Mild to moderate mitral annular  calcification.  8. The mitral valve is normal in structure. Mild mitral valve regurgitation.  9. The tricuspid valve is normal in structure. Tricuspid valve regurgitation moderate-severe. 10. Aortic valve mean gradient measures 18.0 mmHg. 11. Aortic valve regurgitation was not visualized by color flow Doppler. Mild aortic valve stenosis. 12. The pulmonic valve was grossly normal. Pulmonic valve regurgitation is trivial by color flow Doppler. 13. Mild plaque invoving the descending aorta. 14. Moderately elevated  pulmonary artery systolic pressure. 15. Small patent foramen ovale with predominantly left to right shunting across the atrial septum. 16. Evidence of atrial level shunting detected by color flow Doppler.  Recent Labs: 02/12/2019: NT-Pro BNP 640 03/30/2019: B Natriuretic Peptide 95.3 06/03/2019: ALT 18 06/05/2019: TSH 5.585 06/08/2019: Magnesium 2.2 06/09/2019: BUN 12; Creatinine, Ser 1.09; Hemoglobin 9.0; Platelets 358; Potassium 3.7; Sodium 131  Recent Lipid Panel    Component Value Date/Time   CHOL 123 06/04/2019 0507   CHOL 125 02/12/2019 1116   TRIG 111 06/04/2019 0507   HDL 30 (L) 06/04/2019 0507   HDL 37 (L) 02/12/2019 1116   CHOLHDL 4.1 06/04/2019 0507   VLDL 22 06/04/2019 0507   LDLCALC 71 06/04/2019 0507   LDLCALC 64 02/12/2019 1116    Physical Exam:    VS:  BP 124/64 (BP Location: Right Wrist, Patient Position: Sitting, Cuff Size: Normal)   Pulse 89   Temp 98 F (36.7 C)   Ht _0  (1.575 m)   Wt 276 lb (125.2 kg)   SpO2 93%   BMI 50.48 kg/m     Wt Readings from Last 3 Encounters:  06/28/19 276 lb (125.2 kg)  06/21/19 285 lb (129.3 kg)  06/09/19 275 lb 12.7 oz (125.1 kg)     GEN: Quite obese lurch no respiratory distress well nourished, well developed in no acute distress HEENT: Normal NECK: No JVD; No carotid bruits LYMPHATICS: No lymphadenopathy CARDIAC: 1/6 to 2/6 systolic flow murmur across her prosthetic valve no AR murmurs markedly diminished  intensity RRR, no , rubs, gallops RESPIRATORY:  Clear to auscultation without rales, wheezing or rhonchi  ABDOMEN: Soft, non-tender, non-distended MUSCULOSKELETAL: Tense bilateral lower extremity edema she has an open wound on the left lower extremity she has been followed in the wound clinic for a year and stasis changes with her edema; No deformity  SKIN: Warm and dry NEUROLOGIC:  Alert and oriented x 3 PSYCHIATRIC:  Normal affect    Signed, Martha More, MD  06/28/2019 9:17 AM    [place]

## 2019-06-28 ENCOUNTER — Other Ambulatory Visit: Payer: Self-pay

## 2019-06-28 ENCOUNTER — Encounter: Payer: Self-pay | Admitting: Cardiology

## 2019-06-28 ENCOUNTER — Ambulatory Visit (INDEPENDENT_AMBULATORY_CARE_PROVIDER_SITE_OTHER): Payer: BC Managed Care – PPO | Admitting: Cardiology

## 2019-06-28 VITALS — BP 124/64 | HR 89 | Temp 98.0°F | Ht 62.0 in | Wt 276.0 lb

## 2019-06-28 DIAGNOSIS — Q2112 Patent foramen ovale: Secondary | ICD-10-CM

## 2019-06-28 DIAGNOSIS — D508 Other iron deficiency anemias: Secondary | ICD-10-CM

## 2019-06-28 DIAGNOSIS — I471 Supraventricular tachycardia: Secondary | ICD-10-CM

## 2019-06-28 DIAGNOSIS — Z7901 Long term (current) use of anticoagulants: Secondary | ICD-10-CM | POA: Diagnosis not present

## 2019-06-28 DIAGNOSIS — I421 Obstructive hypertrophic cardiomyopathy: Secondary | ICD-10-CM

## 2019-06-28 DIAGNOSIS — G4733 Obstructive sleep apnea (adult) (pediatric): Secondary | ICD-10-CM

## 2019-06-28 DIAGNOSIS — Q211 Atrial septal defect: Secondary | ICD-10-CM

## 2019-06-28 DIAGNOSIS — G459 Transient cerebral ischemic attack, unspecified: Secondary | ICD-10-CM | POA: Diagnosis not present

## 2019-06-28 DIAGNOSIS — Z954 Presence of other heart-valve replacement: Secondary | ICD-10-CM | POA: Diagnosis not present

## 2019-06-28 DIAGNOSIS — I110 Hypertensive heart disease with heart failure: Secondary | ICD-10-CM

## 2019-06-28 NOTE — Patient Instructions (Signed)
Medication Instructions:  Your physician has recommended you make the following change in your medication:   STOP aspirin  *If you need a refill on your cardiac medications before your next appointment, please call your pharmacy*  Lab Work: None  If you have labs (blood work) drawn today and your tests are completely normal, you will receive your results only by: Marland Kitchen MyChart Message (if you have MyChart) OR . A paper copy in the mail If you have any lab test that is abnormal or we need to change your treatment, we will call you to review the results.  Testing/Procedures: Your physician has recommended that you wear an event monitor. Event monitors are medical devices that record the heart's electrical activity. Doctors most often [place] these monitors to diagnose arrhythmias. Arrhythmias are problems with the speed or rhythm of the heartbeat. The monitor is a small, portable device. You can wear one while you do your normal daily activities. This is usually used to diagnose what is causing palpitations/syncope (passing out). Wear for 30 days.   Follow-Up: At [facility], you and your health needs are our priority.  As part of our continuing mission to provide you with exceptional heart care, we have created designated Provider Care Teams.  These Care Teams include your primary Cardiologist (physician) and Advanced Practice Providers (APPs -  Physician Assistants and Nurse Practitioners) who all work together to provide you with the care you need, when you need it.  Your next appointment:   3 months  The format for your next appointment:   In Person  Provider:   Shirlee More, MD    Ambulatory Cardiac Monitoring An ambulatory cardiac monitor is a small recording device that is used to detect abnormal heart rhythms (arrhythmias). Most monitors are connected by wires to flat, sticky disks (electrodes) that are then attached to your chest. You may need to wear a monitor if you have had  symptoms such as:  Fast heartbeats (palpitations).  Dizziness.  Fainting or light-headedness.  Unexplained weakness.  Shortness of breath. There are several types of monitors. Some common monitors include:  Holter monitor. This records your heart rhythm continuously, usually for 24-48 hours.  Event (episodic) monitor. This monitor has a symptoms button, and when pushed, it will begin recording. You need to activate this monitor to record when you have a heart-related symptom.  Automatic detection monitor. This monitor will begin recording when it detects an abnormal heartbeat. What are the risks? Generally, these devices are safe to use. However, it is possible that the skin under the electrodes will become irritated. How to prepare for monitoring Your health care provider will prepare your chest for the electrode placement and show you how to use the monitor.  Do not apply lotions to your chest before monitoring.  Follow directions on how to care for the monitor, and how to return the monitor when the testing period is complete. How to use your cardiac monitor  Follow directions about how long to wear the monitor, and if you can take the monitor off in order to shower or bathe. ? Do not let the monitor get wet. ? Do not bathe, swim, or use a hot tub while wearing the monitor.  Keep your skin clean. Do not put body lotion or moisturizer on your chest.  Change the electrodes as told by your health care provider, or any time they stop sticking to your skin. You may need to use medical tape to keep them on.  Try to put the electrodes in slightly different places on your chest to help prevent skin irritation. Follow directions from your health care provider about where to place the electrodes.  Make sure the monitor is safely clipped to your clothing or in a location close to your body as recommended by your health care provider.  If your monitor has a symptoms button, press the  button to mark an event as soon as you feel a heart-related symptom, such as: ? Dizziness. ? Weakness. ? Light-headedness. ? Palpitations. ? Thumping or pounding in your chest. ? Shortness of breath. ? Unexplained weakness.  Keep a diary of your activities, such as walking, doing chores, and taking medicine. It is very important to note what you were doing when you pushed the button to record your symptoms. This will help your health care provider determine what might be contributing to your symptoms.  Send the recorded information as recommended by your health care provider. It may take some time for your health care provider to process the results.  Change the batteries as told by your health care provider.  Keep electronic devices away from your monitor. These include: ? Tablets. ? MP3 players. ? Cell phones.  While wearing your monitor you should avoid: ? Electric blankets. ? Armed forces operational officer. ? Electric toothbrushes. ? Microwave ovens. ? Magnets. ? Metal detectors. Get help right away if:  You have chest pain.  You have shortness of breath or extreme difficulty breathing.  You develop a very fast heartbeat that does not get better.  You develop dizziness that does not go away.  You faint or constantly feel like you are about to faint. Summary  An ambulatory cardiac monitor is a small recording device that is used to detect abnormal heart rhythms (arrhythmias).  Make sure you understand how to send the information from the monitor to your health care provider.  It is important to press the button on the monitor when you have any heart-related symptoms.  Keep a diary of your activities, such as walking, doing chores, and taking medicine. It is very important to note what you were doing when you pushed the button to record your symptoms. This will help your health care provider learn what might be causing your symptoms. This information is not intended to replace  advice given to you by your health care provider. Make sure you discuss any questions you have with your health care provider. Document Released: 06/04/2008 Document Revised: 08/08/2017 Document Reviewed: 08/10/2016 Elsevier Patient Education  2020 [employer].

## 2019-06-29 ENCOUNTER — Ambulatory Visit (INDEPENDENT_AMBULATORY_CARE_PROVIDER_SITE_OTHER): Payer: BC Managed Care – PPO | Admitting: Pharmacist Clinician (PhC)/ Clinical Pharmacy Specialist

## 2019-06-29 DIAGNOSIS — G459 Transient cerebral ischemic attack, unspecified: Secondary | ICD-10-CM | POA: Diagnosis not present

## 2019-06-29 DIAGNOSIS — Z7901 Long term (current) use of anticoagulants: Secondary | ICD-10-CM | POA: Diagnosis not present

## 2019-06-29 DIAGNOSIS — Z954 Presence of other heart-valve replacement: Secondary | ICD-10-CM

## 2019-06-29 LAB — POCT INR: INR: 4.3 — AB (ref 2.0–3.0)

## 2019-07-01 ENCOUNTER — Encounter: Payer: Self-pay | Admitting: Occupational Therapy

## 2019-07-01 NOTE — Patient Instructions (Signed)

## 2019-07-02 ENCOUNTER — Other Ambulatory Visit: Payer: Self-pay

## 2019-07-02 ENCOUNTER — Ambulatory Visit: Payer: BC Managed Care – PPO | Attending: Family Medicine | Admitting: Occupational Therapy

## 2019-07-02 DIAGNOSIS — I890 Lymphedema, not elsewhere classified: Secondary | ICD-10-CM | POA: Insufficient documentation

## 2019-07-02 NOTE — Therapy (Signed)
[place] MAIN [facility] SERVICES [address] [place], [place], [address] Phone: [phone]   Fax:  [phone]  Occupational Therapy Evaluation  Patient Details  Name: [name] MRN: [id number] Date of Birth: 01-06-1956 Referring Provider (OT): Jacqlyn Larsen , MD   Encounter Date: 07/02/2019  OT End of Session - 07/02/19 1304    Visit Number  1    Number of Visits  36    Date for OT Re-Evaluation  09/30/19    OT Start Time  1020    OT Stop Time  1115    OT Time Calculation (min)  55 min    Activity Tolerance  Patient tolerated treatment well    Behavior During Therapy  [facility] for tasks assessed/performed       Past Medical History:  Diagnosis Date  . Aortic stenosis 03/23/2013   Overview:  02/18/13 TTE EF >55%. Critical AS with mean Ao valve gradient of 82 mm Hg. No AI. No MR, PR, mild TR. Estimated RVSP 30 mm Hg.  . Bicuspid aortic valve   . Edema of both legs 02/20/2017  . 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 Heart failure ([place])   . HTN (hypertension) 03/23/2013  . 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 Morbid obesity ([place]) 02/20/2017  . Sleep apnea     Past Surgical History:  Procedure Laterality Date  . BUBBLE STUDY  06/07/2019   Procedure: BUBBLE STUDY;  Surgeon: Buford Dresser, MD;  Location: [place];  Service: Cardiovascular;;  . CHOLECYSTECTOMY  1983  . RIGHT/LEFT HEART CATH AND CORONARY ANGIOGRAPHY N/A 04/01/2019   Procedure: RIGHT/LEFT HEART CATH AND CORONARY ANGIOGRAPHY;  Surgeon: Lorretta Harp, MD;  Location: [place] CV LAB;  Service: Cardiovascular;  Laterality: N/A;  . TEE WITHOUT CARDIOVERSION N/A 07/01/2018   Procedure: TRANSESOPHAGEAL ECHOCARDIOGRAM (TEE);  Surgeon: Buford Dresser, MD;  Location[facility] ENDOSCOPY;  Service: Cardiovascular;  Laterality: N/A;  . TEE WITHOUT CARDIOVERSION N/A 06/07/2019   Procedure: TRANSESOPHAGEAL ECHOCARDIOGRAM (TEE);  Surgeon: Buford Dresser, MD;  Location: [facility] ENDOSCOPY;  Service: Cardiovascular;  Laterality: N/A;  . TISSUE AORTIC VALVE REPLACEMENT      There were no vitals filed for this visit.  Subjective Assessment - 07/01/19 1150    Subjective   [name] is referred to Occupational Therapy by her PCP, Jacqlyn Larsen, MD, for evaluation and treatment of long-standing, Pt presents in transport w/c and is accompanied by her spouse. Pt is barefooted and lymphorrhea is observed dripping from 50 cent piece sized anterior leg wound. Pt reports leg swelling started after aortic valve replacement . While it fluctuates it does not ever fully resolve. Pt has had multiple exacerbations with hx of LLE leg ulcerations and and recurrent cellulitis. Pt has not been able to tolerate standard, off the shelf, circular knit compression stockings historically due to poor fit and irritation to leg wounds. She has a sequential pneumatic "lymphedema pump", but is historically non-compliant with this device.ABIs and Doppler studies were negative for DVT and venous reflux, and ABIs were WNL.    Pertinent History  Complex  H&P with several contributing co-morbidities, including CHF, CAD, HTN, Aortic stenosis s/p bioprosthtic valve replacement, severe OSA (bipap), chronic kidney insufficiency, super obesity    Limitations  difficulty walking, impaired functional mobility and transfers, generalized deconditioning, impaired activity tolerance, decreased endurance, AROM limitations at hips, knees and ankles 2/2 body habitus/ girth at joints,    Repetition  Increases Symptoms  Special Tests  + Stemmer Sign base of toes bilaterally        [facility] OT Assessment - 07/02/19 0001      Assessment   Medical Diagnosis  Moderate BLE stage II Lymphedema    Referring Provider (OT)  Jacqlyn Larsen , MD    Onset Date/Surgical Date  --   03/2013 s/p aortic valve replacement   Hand Dominance  Right    Prior Therapy  no compression garments, no CDT; + for  wound care      Precautions   Precautions  Fall;Other (comment)   CARDIAC precautions for compression and MLD      Balance Screen   Has the patient fallen in the past 6 months  Yes    How many times?  5    Has the patient had a decrease in activity level because of a fear of falling?   Yes    Is the patient reluctant to leave their home because of a fear of falling?   No      Home  Environment   Living Arrangements  Spouse/significant other    Type of Home  House    Home Access  Stairs    Entrance Stairs-Rails  --   2   Entrance Stairs-Number of Steps  2    Home Layout  Two level    Alternate Level Stairs - Number of Steps  --   no hand rails   Bathroom Shower/Tub  Tub/Shower unit;Curtain    How accessible  Accessible via walker    [place] - 4 wheels    Additional Comments  rollator    Lives With  Spouse      IADL   Prior Level of Function Shopping  Max A    Shopping  Needs to be accompanied on any shopping trip    Prior Level of Function Light Housekeeping  Max A    Light Housekeeping  Needs help with all home maintenance tasks    Prior Level of Function Meal Prep  Max A    Meal Prep  Able to complete simple cold meal and snack prep    Prior Level of Function Community Mobility  Max A    Community Mobility  Relies on family or friends for transportation      Mobility   Mobility Status  History of falls    Mobility Status Comments  transport chair in community, Radiation protection practitioner at home      Vision - History   Baseline Vision  Wears glasses all the time      Activity Tolerance   Activity Tolerance  Tolerates 10-20 min activity with multiple rests      Cognition   Overall Cognitive Status  Within Functional Limits for tasks assessed      Posture/Postural Control   Posture Comments  BLE comparative limb volumetrics, Lymphedema Life IImpact Scale (LLIS), Lower Extremity Funtionall Scale (LEFS)      Sensation   Light Touch  Appears Intact      Coordination    Gross Motor Movements are Fluid and Coordinated  Not tested      ROM / Strength   AROM / PROM / Strength  Strength;AROM      AROM   Overall AROM Comments  decreased at ankles, knees and hips 2/2 body habitus and girth due to swelling      Strength   Overall Strength  Deficits;Other (comment)   2/2 deconditioning  Moderate, stage II BLE phlebo- lymphedema to suspected CVI and Obsesity Skin  Description Hyper-Keratosis Peau' de Orange Shiny Tight Fibrotic Fatty Doughy Indurated     x x moderate x  BLE   Hydration Dry Flaky Erythema Macerated   moderate x     Color Redness Present Pallor Blanching Hemosiderin Staining Other   x  x x     Odor Malodorous Yeast Fungal infection  Absent      x   Temperature Warm Cool wnl    x    Pitting Edema   1+ 2+ 3+ 4+ Non-pitting        x   Girth Symmetrical Asymmetrical Other Distribution    x L>R   Stemmer Sign Positive Negative     BLE, strong    Lymphorrea History Of:  Present Absent    x     Wounds History Of Present Absent Venous Arterial Pressure Size    X L shin, ~3 cm round           Signs of Infection Redness Warmth Erythema Acute Swelling Drainage Borders   x                Scars   Adhesions Hypersensitivity          Sensation Light Touch Deep pressure Hypersensitivty   Present Impaired Present Impaired Absent Impaired   x  x     x  Nails WNL Fungus Other       x Hair Growth Symmetrical Asymmetrical   x    Skin Creases Base of toes  Ankles   Base of Fingers Medial Thighs              x x   x               OT Treatments/Exercises (OP) - 07/02/19 0001      Transfers   Transfers  Sit to Stand;Stand to Sit    Sit to Stand  --   TBA   Stand to Sit  --   TBA   Comments  not assessed due to time constraints. Pt arrived late o session      ADLs   Overall ADLs  needs assistance with basic and instrumental ADLs due to mobility deficits, decreased activity tolerance, leg  swelling and associated pain    UB Dressing  set up with Mod A    LB Dressing  difficulty fitting street shoes , socks and LB clothing    Bathing  Mod A    Functional Mobility  impaired transfwers and bed mobility    Cooking  impaired. Unavble to stand > 15 minutes . Requires multiple seated rest breaks.    Home Maintenance  impaired. mAX a      Manual Therapy   Manual Therapy  Edema management    Manual therapy comments  LEFS score: 18            OT Education - 07/01/19 1216    Education Details  Provided Pt and family education regarding lymphatic structure and function, etiologies, onset patterns and stages of progression. Discussed  impact of obesity on lymphatic system function. Outlined Complete Decongestive Therapy (CDT)  as standard of care and provided in depth information regarding 4 primary components of both Intensive and Self Management Phases, including Manual Lymph Drainage (MLD), compression wrapping and garments, skin care, and therapeutic exercise.   Pilar Plate discussion of high burden of care and in this case, need for consistent ,  daily caregiver assistance for optimal clinical outcome. Discussed  Importance of daily, ongoing LE self-care essential to retaining clinical gains and limiting progression.  Lastly, reviewed lymphedema precautions, including cellulitis risk and difficulty with wound healing. Provided printed Lymphedema Workbook for reference.    Person(s) Educated  Patient    Methods  Explanation;Demonstration;Handout    Comprehension  Verbalized understanding;Returned demonstration;Need further instruction          OT Long Term Goals - 07/01/19 1217      OT LONG TERM GOAL #1   Title  Pt will be able to verbalize signs and symptoms of cellulitis infection and identify 4 common lymphedema precautions using printed resource for reference (modified independence) to limit LE progression over time .    Baseline  Max A    Time  4    Period  Days    Status   New    Target Date  --   4th OT Rx visit     OT LONG TERM GOAL #2   Title  Pt will apply BLE, knee length, multi-layer, short stretch compression wraps daily using correct gradient techniques with max CG assist    to achieve optimal limb volume reduction, to return affected limb , as closely as possible, to premorbid size and shape, to limit infection risk, and to improve safe functional ambulation and mobility.    Baseline  dependent    Time  4    Period  Days    Status  New    Target Date  --   4th OT Rx visit     OT LONG TERM GOAL #3   Title  Pt to achieve at least 10% BLE limb volume reductions during Intensive Phase CDT to improve independence and safety with functional performance of basic and instrumental ADLs, functional  mobility and ambulation, and  to limit  infection risk and LE progression.    Baseline  Dependent    Time  12    Period  Weeks    Status  New    Target Date  09/30/19      OT LONG TERM GOAL #4   Title  Pt will achieve and sustain at least 85% compliance with daily LE self-care home program (skin care, lymphatic pumping therex, compression and simple self-MLD) ) with Max CG assistance during Intensive Phase CDT  to limit  limb swelling, reduce infection risk,  limit associated pain , and limit LE progression.    Baseline  dependent    Time  12    Period  Weeks    Status  New    Target Date  09/30/19      OT LONG TERM GOAL #5   Title  Pt will be able to don and doff appropriate compression garments and/ or devices using correct techniques and assistive devices with Max CG A by issue date  for optimal LE management to limit progression over time.    Baseline  12    Status  New    Target Date  09/30/19      Long Term Additional Goals   Additional Long Term Goals  Yes      OT LONG TERM GOAL #6   Title  During self-management phase of CDT Pt will retain limb volume reductions achieved during Intensive Phase CDT with Max CG A with no more than 3% volume  increase to limit LE progression and further functional decline.    Baseline  dependent    Time  6    Period  Months    Status  New    Target Date  12/29/19            Plan - 07/01/19 1234    Clinical Impression Statement  [name] is a 63 year-old female presenting with moderate, stage II, BLE plebo-lymphedema (LE) secondary to suspected venous insufficiency and obesity w/ onset s/p aortic valve replacement.  A complex constellation of medical comorbidities significantly contributes to leg swelling by overloading lymphatic pathways, including CHF, CAD, HTN, OSA, chronic kidney insufficiency, chronic inflammation 2/2 recurrent wounds and cellulitis infections.  Sedentary lifestyle with frequent dependent positioning also contributes to leg swelling and the accumulation of fatty subcutaneous fibrosis distally. In this case chronic, progressive BLE LE and associated pain limits ambulation, functional mobility and transfers.  It limits Pt's ability to perform basic and instrumental ADLs, productive activities and leisure pursuits, social participation and role performance at home and in the community. [name] will benefit from Occupational Therapy for Intensive and Management Phase Complete Decongestive Therapy (CDT) to include manual lymphatic drainage (MLD), skin care, therapeutic exercise and compression therapy. Emphasis throughout OT course will emphasize Pt and family caregiver education to facilitate optimal long term LE self-care. Pt will require maximum caregiver assistance during visit intervals and ongoing for optimal clinical and functional outcomes.  Without skilled OT for CDT, LE will progress resulting in worsening of the condition and further functional decline.    OT Occupational Profile and History  Comprehensive Assessment- Review of records and extensive additional review of physical, cognitive, psychosocial history related to current functional performance    Occupational  performance deficits (Please refer to evaluation for details):  ADL's;Leisure;IADL's;Social Participation;Rest and Sleep;Work;Play    Body Structure / Function / Physical Skills  ADL;Endurance;Obesity;Decreased knowledge of precautions;Balance;Decreased knowledge of use of DME;IADL;Pain;Skin integrity;Cardiopulmonary status limiting activity;Edema;Gait;Mobility;ROM    Rehab Potential  Good    Clinical Decision Making  Multiple treatment options, significant modification of task necessary    Comorbidities Affecting Occupational Performance:  Presence of comorbidities impacting occupational performance    Comorbidities impacting occupational performance description:  multiple, complex comorbidities affecting participation in Intensive Phase Complete Decongestive Therapy protocols with demanding schedule and high burden of care    Modification or Assistance to Complete Evaluation   Max significant modification of tasks or assist is necessary to complete    OT Frequency  3x / week    OT Duration  Other (comment)   3x wk for 4 weeks w/ strong emphasis on CG training, then reduce frequency to 2 x week for remainder of intensive. Cont to support PRN   OT Treatment/Interventions  Self-care/ADL training;Therapeutic exercise;Manual lymph drainage;Coping strategies training;Compression bandaging;Patient/family education;Other (comment);Energy conservation;Therapist, nutritional;Therapeutic activities;DME and/or AE instruction;Manual Therapy    Plan  Complete Decomngestive Therapy: Manual Lymphatic Drainage (MLD) , skin care, compression wraps, therapeutic exercise. Fit with custom compression knee highs and HOS devices.    Consulted and Agree with Plan of Care  Patient;Family member/caregiver       Patient will benefit from skilled therapeutic intervention in order to improve the following deficits and impairments:   Body Structure / Function / Physical Skills: ADL, Endurance, Obesity, Decreased  knowledge of precautions, Balance, Decreased knowledge of use of DME, IADL, Pain, Skin integrity, Cardiopulmonary status limiting activity, Edema, Gait, Mobility, ROM       Visit Diagnosis: Lymphedema, not elsewhere classified - Plan: Ot plan of care cert/re-cert    Problem List Patient Active Problem  List   Diagnosis Date Noted  . Long term (current) use of anticoagulants 06/11/2019  . TIA (transient ischemic attack) 06/04/2019  . Transient neurologic deficit 06/03/2019  . Anemia 06/03/2019  . Chronic diastolic CHF (congestive heart failure) ([place]) 06/03/2019  . CAD (coronary artery disease) 06/03/2019  . Essential hypertension 06/03/2019  . Sleep apnea 09/25/2018  . At risk for obstructive sleep apnea 08/14/2018  . Obstructive hypertrophic cardiomyopathy ([place]) 07/10/2018  . Dysfunctional uterine bleeding 04/22/2018  . Edema of both legs 02/20/2017  . 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 Morbid obesity ([place]) 02/20/2017  . Aortic stenosis 03/23/2013  . Hypertensive heart disease with heart failure ([place]) 03/23/2013    Andrey Spearman, MS, OTR/L, [facility] 07/02/19 1:52 PM    [place] MAIN [facility] SERVICES [address] [place], [place], [address] Phone: [phone]   Fax:  [phone]  Name: [name] MRN: [id number] Date of Birth: 12/21/1955

## 2019-07-05 ENCOUNTER — Other Ambulatory Visit: Payer: Self-pay

## 2019-07-05 ENCOUNTER — Ambulatory Visit: Payer: BC Managed Care – PPO | Admitting: Occupational Therapy

## 2019-07-05 DIAGNOSIS — I890 Lymphedema, not elsewhere classified: Secondary | ICD-10-CM | POA: Diagnosis not present

## 2019-07-05 NOTE — Therapy (Signed)
[place] MAIN [facility] SERVICES [address] [place], [place], [address] Phone: [phone]   Fax:  [phone]  Occupational Therapy Treatment  Patient Details  Name: [name] MRN: [id number] Date of Birth: Jan 10, 1956 Referring Provider (OT): Jacqlyn Larsen , MD   Encounter Date: 07/05/2019  OT End of Session - 07/05/19 1622    Visit Number  2    Number of Visits  36    Date for OT Re-Evaluation  09/30/19    OT Start Time  0310    OT Stop Time  0415    OT Time Calculation (min)  65 min    Activity Tolerance  Patient tolerated treatment well    Behavior During Therapy  [facility] for tasks assessed/performed       Past Medical History:  Diagnosis Date  . Aortic stenosis 03/23/2013   Overview:  02/18/13 TTE EF >55%. Critical AS with mean Ao valve gradient of 82 mm Hg. No AI. No MR, PR, mild TR. Estimated RVSP 30 mm Hg.  . Bicuspid aortic valve   . Edema of both legs 02/20/2017  . 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 Heart failure ([place])   . HTN (hypertension) 03/23/2013  . 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 Morbid obesity ([place]) 02/20/2017  . Sleep apnea     Past Surgical History:  Procedure Laterality Date  . BUBBLE STUDY  06/07/2019   Procedure: BUBBLE STUDY;  Surgeon: Buford Dresser, MD;  Location: [place];  Service: Cardiovascular;;  . CHOLECYSTECTOMY  1983  . RIGHT/LEFT HEART CATH AND CORONARY ANGIOGRAPHY N/A 04/01/2019   Procedure: RIGHT/LEFT HEART CATH AND CORONARY ANGIOGRAPHY;  Surgeon: Lorretta Harp, MD;  Location: [place] CV LAB;  Service: Cardiovascular;  Laterality: N/A;  . TEE WITHOUT CARDIOVERSION N/A 07/01/2018   Procedure: TRANSESOPHAGEAL ECHOCARDIOGRAM (TEE);  Surgeon: Buford Dresser, MD;  Location: [facility] ENDOSCOPY;  Service: Cardiovascular;  Laterality: N/A;  . TEE WITHOUT CARDIOVERSION N/A 06/07/2019   Procedure: TRANSESOPHAGEAL ECHOCARDIOGRAM (TEE);  Surgeon: Buford Dresser,  MD;  Location: [facility] ENDOSCOPY;  Service: Cardiovascular;  Laterality: N/A;  . TISSUE AORTIC VALVE REPLACEMENT      There were no vitals filed for this visit.  Subjective Assessment - 07/05/19 1520    Subjective   [name] Presents to OT for intensive phase CDT to BLE. Today we commence CDT to LLE. [name], [name]. Pt has no new complaints since last            seen last Friday.    Patient is accompanied by:  Family member    Pertinent History  Complex  H&P with several contributing co-morbidities, including CHF, CAD, HTN, Aortic stenosis s/p bioprosthtic valve replacement, severe OSA (bipap), chronic kidney insufficiency, super obesity    Limitations  difficulty walking, impaired functional mobility and transfers, generalized deconditioning, impaired activity tolerance, decreased endurance, AROM limitations at hips, knees and ankles 2/2 body habitus/ girth at joints,    Repetition  Increases Symptoms    Special Tests  + Stemmer Sign base of toes bilaterally    Currently in Pain?  Yes    Pain Score  1     Pain Location  Leg    Pain Orientation  Right;Left    Pain Onset  More than a month ago    Pain Frequency  Intermittent          LYMPHEDEMA/ONCOLOGY QUESTIONNAIRE - 07/05/19 1616      Lymphedema Assessments  Lymphedema Assessments  Lower extremities      Right Lower Extremity Lymphedema   Other  RLE A-D = 5664.01 ml      Left Lower Extremity Lymphedema   Other  LLE A-D= 6548.43 ml    Other  LVD= 15.61% L>R              OT Treatments/Exercises (OP) - 07/05/19 0001      ADLs   ADL Education Given  Yes      Manual Therapy   Manual Therapy  Edema management    Edema Management  BLE comparative limb volumetrics             OT Education - 07/05/19 1620    Education Details  Pt and family edu for meaning of limb volume measurements and for intro level  gradient compression wrapping below the knee with short stretch wraps. reviewed  contraindications and instructed to remove all wraps if Pt experiences acute onset of pain or atypical SOB, esopecially at rest.    Person(s) Educated  Patient    Methods  Explanation;Demonstration;Handout    Comprehension  Verbalized understanding;Returned demonstration;Need further instruction          OT Long Term Goals - 07/01/19 1217      OT LONG TERM GOAL #1   Title  Pt will be able to verbalize signs and symptoms of cellulitis infection and identify 4 common lymphedema precautions using printed resource for reference (modified independence) to limit LE progression over time .    Baseline  Max A    Time  4    Period  Days    Status  New    Target Date  --   4th OT Rx visit     OT LONG TERM GOAL #2   Title  Pt will apply BLE, knee length, multi-layer, short stretch compression wraps daily using correct gradient techniques with max CG assist    to achieve optimal limb volume reduction, to return affected limb , as closely as possible, to premorbid size and shape, to limit infection risk, and to improve safe functional ambulation and mobility.    Baseline  dependent    Time  4    Period  Days    Status  New    Target Date  --   4th OT Rx visit     OT LONG TERM GOAL #3   Title  Pt to achieve at least 10% BLE limb volume reductions during Intensive Phase CDT to improve independence and safety with functional performance of basic and instrumental ADLs, functional  mobility and ambulation, and  to limit  infection risk and LE progression.    Baseline  Dependent    Time  12    Period  Weeks    Status  New    Target Date  09/30/19      OT LONG TERM GOAL #4   Title  Pt will achieve and sustain at least 85% compliance with daily LE self-care home program (skin care, lymphatic pumping therex, compression and simple self-MLD) ) with Max CG assistance during Intensive Phase CDT  to limit  limb swelling, reduce infection risk,  limit associated pain , and limit LE progression.     Baseline  dependent    Time  12    Period  Weeks    Status  New    Target Date  09/30/19      OT LONG TERM GOAL #5   Title  Pt  will be able to don and doff appropriate compression garments and/ or devices using correct techniques and assistive devices with Max CG A by issue date  for optimal LE management to limit progression over time.    Baseline  12    Status  New    Target Date  09/30/19      Long Term Additional Goals   Additional Long Term Goals  Yes      OT LONG TERM GOAL #6   Title  During self-management phase of CDT Pt will retain limb volume reductions achieved during Intensive Phase CDT with Max CG A with no more than 3% volume increase to limit LE progression and further functional decline.    Baseline  dependent    Time  6    Period  Months    Status  New    Target Date  12/29/19            Plan - 07/05/19 1622    Clinical Impression Statement  Completed baseline comparative limb volumetrics taken from ankles to tibial tuberosities (A-D). Baseline limb volume differential measures 15.61%, L>R. The less involved RLE is the dominant limb, and typically 2-3% larger than the non dominant limb. Pt sl;ept through session. Spouse was attentive and engaged in all edu for LE self-care home program throughout session. Pt and spouse expressed understanding of all precautions and contraindications gfor compression today. They agreed to remove wraps if Pt experiences any atypical SOB, acute pain and/ or leg swelling.Cont as per POC.    OT Occupational Profile and History  Comprehensive Assessment- Review of records and extensive additional review of physical, cognitive, psychosocial history related to current functional performance    Occupational performance deficits (Please refer to evaluation for details):  ADL's;Leisure;IADL's;Social Participation;Rest and Sleep;Work;Play    Body Structure / Function / Physical Skills  ADL;Endurance;Obesity;Decreased knowledge of  precautions;Balance;Decreased knowledge of use of DME;IADL;Pain;Skin integrity;Cardiopulmonary status limiting activity;Edema;Gait;Mobility;ROM    Rehab Potential  Good    Clinical Decision Making  Multiple treatment options, significant modification of task necessary    Comorbidities Affecting Occupational Performance:  Presence of comorbidities impacting occupational performance    Comorbidities impacting occupational performance description:  multiple, complex comorbidities affecting participation in Intensive Phase Complete Decongestive Therapy protocols with demanding schedule and high burden of care    Modification or Assistance to Complete Evaluation   Max significant modification of tasks or assist is necessary to complete    OT Frequency  3x / week    OT Duration  Other (comment)   3x wk for 4 weeks w/ strong emphasis on CG training, then reduce frequency to 2 x week for remainder of intensive. Cont to support PRN   OT Treatment/Interventions  Self-care/ADL training;Therapeutic exercise;Manual lymph drainage;Coping strategies training;Compression bandaging;Patient/family education;Other (comment);Energy conservation;Therapist, nutritional;Therapeutic activities;DME and/or AE instruction;Manual Therapy    Plan  Complete Decomngestive Therapy: Manual Lymphatic Drainage (MLD) , skin care, compression wraps, therapeutic exercise. Fit with custom compression knee highs and HOS devices.    Consulted and Agree with Plan of Care  Patient;Family member/caregiver       Patient will benefit from skilled therapeutic intervention in order to improve the following deficits and impairments:   Body Structure / Function / Physical Skills: ADL, Endurance, Obesity, Decreased knowledge of precautions, Balance, Decreased knowledge of use of DME, IADL, Pain, Skin integrity, Cardiopulmonary status limiting activity, Edema, Gait, Mobility, ROM       Visit Diagnosis: Lymphedema, not elsewhere  classified  Problem List Patient Active Problem List   Diagnosis Date Noted  . Long term (current) use of anticoagulants 06/11/2019  . TIA (transient ischemic attack) 06/04/2019  . Transient neurologic deficit 06/03/2019  . Anemia 06/03/2019  . Chronic diastolic CHF (congestive heart failure) ([place]) 06/03/2019  . CAD (coronary artery disease) 06/03/2019  . Essential hypertension 06/03/2019  . Sleep apnea 09/25/2018  . At risk for obstructive sleep apnea 08/14/2018  . Obstructive hypertrophic cardiomyopathy ([place]) 07/10/2018  . Dysfunctional uterine bleeding 04/22/2018  . Edema of both legs 02/20/2017  . 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 Morbid obesity ([place]) 02/20/2017  . Aortic stenosis 03/23/2013  . Hypertensive heart disease with heart failure ([place]) 03/23/2013    Andrey Spearman, MS, OTR/L, [facility] 07/05/19 4:31 PM  [place] MAIN [facility] SERVICES [address] [place], [place], [address] Phone: [phone]   Fax:  [phone]  Name: [name] MRN: [id number] Date of Birth: October 16, 1955

## 2019-07-06 ENCOUNTER — Ambulatory Visit (INDEPENDENT_AMBULATORY_CARE_PROVIDER_SITE_OTHER): Payer: BC Managed Care – PPO | Admitting: *Deleted

## 2019-07-06 ENCOUNTER — Other Ambulatory Visit: Payer: Self-pay

## 2019-07-06 DIAGNOSIS — Z7901 Long term (current) use of anticoagulants: Secondary | ICD-10-CM

## 2019-07-06 DIAGNOSIS — G459 Transient cerebral ischemic attack, unspecified: Secondary | ICD-10-CM

## 2019-07-06 LAB — POCT INR: INR: 3.8 — AB (ref 2.0–3.0)

## 2019-07-06 NOTE — Patient Instructions (Signed)
Hold warfarin tonight then decrease dose to 1/2 tablet daily except 1 tablet on Sundays, Tuesdays and Thursdays.  Repeat INR in 2 weeks

## 2019-07-08 ENCOUNTER — Encounter: Payer: BC Managed Care – PPO | Admitting: Occupational Therapy

## 2019-07-09 ENCOUNTER — Ambulatory Visit: Payer: BC Managed Care – PPO | Admitting: Occupational Therapy

## 2019-07-09 ENCOUNTER — Other Ambulatory Visit: Payer: Self-pay

## 2019-07-09 DIAGNOSIS — I890 Lymphedema, not elsewhere classified: Secondary | ICD-10-CM

## 2019-07-09 NOTE — Therapy (Signed)
[place] MAIN [facility] SERVICES [address] [place], [place], [address] Phone: [phone]   Fax:  [phone]  Patient Details  Name: [name] MRN: [id number] Date of Birth: 21-Feb-1956 Referring Provider:  [name], MD  Encounter Date: 07/09/2019   Andrey Spearman, MS, OTR/L, [facility] 07/09/19 12:14 PM  [place] MAIN [facility] SERVICES [address] [place], [place], [address] Phone: [phone]   Fax:  [phone]

## 2019-07-12 ENCOUNTER — Other Ambulatory Visit: Payer: Self-pay

## 2019-07-12 ENCOUNTER — Telehealth: Payer: Self-pay | Admitting: Cardiology

## 2019-07-12 ENCOUNTER — Ambulatory Visit: Payer: BC Managed Care – PPO | Attending: Family Medicine | Admitting: Occupational Therapy

## 2019-07-12 DIAGNOSIS — I890 Lymphedema, not elsewhere classified: Secondary | ICD-10-CM | POA: Diagnosis present

## 2019-07-12 NOTE — Telephone Encounter (Signed)
Left message to return call 

## 2019-07-12 NOTE — Therapy (Signed)
[place] MAIN [facility] SERVICES [address] [place], [place], [address] Phone: [phone]   Fax:  [phone]  Occupational Therapy Treatment  Patient Details  Name: [name] MRN: [id number] Date of Birth: 11-Jun-1956 Referring Provider (OT): Jacqlyn Larsen , MD   Encounter Date: 07/12/2019  OT End of Session - 07/12/19 1714    Visit Number  4    Number of Visits  36    Date for OT Re-Evaluation  09/30/19    OT Start Time  0305    OT Stop Time  0405    OT Time Calculation (min)  60 min    Activity Tolerance  Patient tolerated treatment well;No increased pain    Behavior During Therapy  WFL for tasks assessed/performed       Past Medical History:  Diagnosis Date  . Aortic stenosis 03/23/2013   Overview:  02/18/13 TTE EF >55%. Critical AS with mean Ao valve gradient of 82 mm Hg. No AI. No MR, PR, mild TR. Estimated RVSP 30 mm Hg.  . Bicuspid aortic valve   . Edema of both legs 02/20/2017  . 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 Heart failure ([place])   . HTN (hypertension) 03/23/2013  . 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 Morbid obesity ([place]) 02/20/2017  . Sleep apnea     Past Surgical History:  Procedure Laterality Date  . BUBBLE STUDY  06/07/2019   Procedure: BUBBLE STUDY;  Surgeon: Buford Dresser, MD;  Location: [place];  Service: Cardiovascular;;  . CHOLECYSTECTOMY  1983  . RIGHT/LEFT HEART CATH AND CORONARY ANGIOGRAPHY N/A 04/01/2019   Procedure: RIGHT/LEFT HEART CATH AND CORONARY ANGIOGRAPHY;  Surgeon: Lorretta Harp, MD;  Location: [place] CV LAB;  Service: Cardiovascular;  Laterality: N/A;  . TEE WITHOUT CARDIOVERSION N/A 07/01/2018   Procedure: TRANSESOPHAGEAL ECHOCARDIOGRAM (TEE);  Surgeon: Buford Dresser, MD;  Location: [facility] ENDOSCOPY;  Service: Cardiovascular;  Laterality: N/A;  . TEE WITHOUT CARDIOVERSION N/A 06/07/2019   Procedure: TRANSESOPHAGEAL ECHOCARDIOGRAM (TEE);  Surgeon:  Buford Dresser, MD;  Location: [facility] ENDOSCOPY;  Service: Cardiovascular;  Laterality: N/A;  . TISSUE AORTIC VALVE REPLACEMENT      There were no vitals filed for this visit.  Subjective Assessment - 07/12/19 1709    Subjective   [name] Presents to OT visit 4/36 for intensive phase CDT to BLE. Pt presents with compression wraps in place. She is accompanied by her husband, [name], who reports he wrapped Pt's LLE below the knee daily without difficulty over the weekend. Pt reports good tolerance        for compression.    Patient is accompanied by:  Family member    Pertinent History  Complex  H&P with several contributing co-morbidities, including CHF, CAD, HTN, Aortic stenosis s/p bioprosthtic valve replacement, severe OSA (bipap), chronic kidney insufficiency, super obesity    Limitations  difficulty walking, impaired functional mobility and transfers, generalized deconditioning, impaired activity tolerance, decreased endurance, AROM limitations at hips, knees and ankles 2/2 body habitus/ girth at joints,    Repetition  Increases Symptoms    Special Tests  + Stemmer Sign base of toes bilaterally    Pain Onset  More than a month ago                   OT Treatments/Exercises (OP) - 07/12/19 0001      ADLs   ADL Education Given  Yes      Manual Therapy  Manual Therapy  Edema management;Manual Lymphatic Drainage (MLD);Compression Bandaging    Manual Lymphatic Drainage (MLD)  MLD to LLE utilining short neck sequence, deep abdominnal pathways and functional inguinal LN in  long sitting w open hip angle.     Compression Bandaging  LLE knee length multilayer gradient compression wrap using 8. 10 and 12 CM short stretch wraps over single layer of stockinett and 0.4 cm Rosidal foam.             OT Education - 07/12/19 1712    Education Details  Entry level skilled edu for simple self MLD based on anatomy structure and function. Pt and family edu for gradient  compression wrapping to LLE.    Person(s) Educated  Patient;Spouse    Methods  Explanation;Demonstration;Handout    Comprehension  Verbalized understanding;Returned demonstration;Need further instruction          OT Long Term Goals - 07/01/19 1217      OT LONG TERM GOAL #1   Title  Pt will be able to verbalize signs and symptoms of cellulitis infection and identify 4 common lymphedema precautions using printed resource for reference (modified independence) to limit LE progression over time .    Baseline  Max A    Time  4    Period  Days    Status  New    Target Date  --   4th OT Rx visit     OT LONG TERM GOAL #2   Title  Pt will apply BLE, knee length, multi-layer, short stretch compression wraps daily using correct gradient techniques with max CG assist    to achieve optimal limb volume reduction, to return affected limb , as closely as possible, to premorbid size and shape, to limit infection risk, and to improve safe functional ambulation and mobility.    Baseline  dependent    Time  4    Period  Days    Status  New    Target Date  --   4th OT Rx visit     OT LONG TERM GOAL #3   Title  Pt to achieve at least 10% BLE limb volume reductions during Intensive Phase CDT to improve independence and safety with functional performance of basic and instrumental ADLs, functional  mobility and ambulation, and  to limit  infection risk and LE progression.    Baseline  Dependent    Time  12    Period  Weeks    Status  New    Target Date  09/30/19      OT LONG TERM GOAL #4   Title  Pt will achieve and sustain at least 85% compliance with daily LE self-care home program (skin care, lymphatic pumping therex, compression and simple self-MLD) ) with Max CG assistance during Intensive Phase CDT  to limit  limb swelling, reduce infection risk,  limit associated pain , and limit LE progression.    Baseline  dependent    Time  12    Period  Weeks    Status  New    Target Date  09/30/19       OT LONG TERM GOAL #5   Title  Pt will be able to don and doff appropriate compression garments and/ or devices using correct techniques and assistive devices with Max CG A by issue date  for optimal LE management to limit progression over time.    Baseline  12    Status  New    Target Date  09/30/19      Long Term Additional Goals   Additional Long Term Goals  Yes      OT LONG TERM GOAL #6   Title  During self-management phase of CDT Pt will retain limb volume reductions achieved during Intensive Phase CDT with Max CG A with no more than 3% volume increase to limit LE progression and further functional decline.    Baseline  dependent    Time  6    Period  Months    Status  New    Target Date  12/29/19            Plan - 07/12/19 1714    Clinical Impression Statement  Lymphorrhea at multiple sites on LLE is resolved today. Wound on anterior L leg appears   to be granulating. Increased skin wrinkling noted today below the knee. Watery edema apparent at leg surface. Skin  anteriorly from mid shin inferiorly to distal leg is reddened. Skin temp is WNL. Cont to monitor closely for signs/ symptoms of infection. Spouse did excelent job with compression wrap application over the weekend. One small error noted in technique, bur slight decreased in distal leg swelling apparent today. Cont as per POC.    OT Occupational Profile and History  Comprehensive Assessment- Review of records and extensive additional review of physical, cognitive, psychosocial history related to current functional performance    Occupational performance deficits (Please refer to evaluation for details):  ADL's;Leisure;IADL's;Social Participation;Rest and Sleep;Work;Play    Body Structure / Function / Physical Skills  ADL;Endurance;Obesity;Decreased knowledge of precautions;Balance;Decreased knowledge of use of DME;IADL;Pain;Skin integrity;Cardiopulmonary status limiting activity;Edema;Gait;Mobility;ROM    Rehab Potential   Good    Clinical Decision Making  Multiple treatment options, significant modification of task necessary    Comorbidities Affecting Occupational Performance:  Presence of comorbidities impacting occupational performance    Comorbidities impacting occupational performance description:  multiple, complex comorbidities affecting participation in Intensive Phase Complete Decongestive Therapy protocols with demanding schedule and high burden of care    Modification or Assistance to Complete Evaluation   Max significant modification of tasks or assist is necessary to complete    OT Frequency  3x / week    OT Duration  Other (comment)   3x wk for 4 weeks w/ strong emphasis on CG training, then reduce frequency to 2 x week for remainder of intensive. Cont to support PRN   OT Treatment/Interventions  Self-care/ADL training;Therapeutic exercise;Manual lymph drainage;Coping strategies training;Compression bandaging;Patient/family education;Other (comment);Energy conservation;Therapist, nutritional;Therapeutic activities;DME and/or AE instruction;Manual Therapy    Plan  Complete Decomngestive Therapy: Manual Lymphatic Drainage (MLD) , skin care, compression wraps, therapeutic exercise. Fit with custom compression knee highs and HOS devices.    Consulted and Agree with Plan of Care  Patient;Family member/caregiver       Patient will benefit from skilled therapeutic intervention in order to improve the following deficits and impairments:   Body Structure / Function / Physical Skills: ADL, Endurance, Obesity, Decreased knowledge of precautions, Balance, Decreased knowledge of use of DME, IADL, Pain, Skin integrity, Cardiopulmonary status limiting activity, Edema, Gait, Mobility, ROM       Visit Diagnosis: Lymphedema, not elsewhere classified    Problem List Patient Active Problem List   Diagnosis Date Noted  . Long term (current) use of anticoagulants 06/11/2019  . TIA (transient ischemic attack)  06/04/2019  . Transient neurologic deficit 06/03/2019  . Anemia 06/03/2019  . Chronic diastolic CHF (congestive heart failure) ([place]) 06/03/2019  . CAD (coronary artery  disease) 06/03/2019  . Essential hypertension 06/03/2019  . Sleep apnea 09/25/2018  . At risk for obstructive sleep apnea 08/14/2018  . Obstructive hypertrophic cardiomyopathy ([place]) 07/10/2018  . Dysfunctional uterine bleeding 04/22/2018  . Edema of both legs 02/20/2017  . 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 Morbid obesity ([place]) 02/20/2017  . Aortic stenosis 03/23/2013  . Hypertensive heart disease with heart failure ([place]) 03/23/2013    Andrey Spearman, MS, OTR/L, [facility] 07/12/19 5:18 PM  [place] MAIN [facility] SERVICES [address] [place], [place], [address] Phone: [phone]   Fax:  [phone]  Name: KENIJAH SAWDON MRN: [id number] Date of Birth: 01-Sep-1956

## 2019-07-12 NOTE — Telephone Encounter (Signed)
Thinks she had a mini stroke

## 2019-07-13 NOTE — Telephone Encounter (Signed)
Left message to return call. Will continue efforts.

## 2019-07-13 NOTE — Telephone Encounter (Signed)
Patient returned your call she was in an appt when you called her yesterday.

## 2019-07-13 NOTE — Telephone Encounter (Signed)
Spoke with patient who reports that she needs to have a D&C but can't due to breathing problems.Within the last week, she states she has been passing an increased amount of blood clots. Patient reports she has made her GYN doctor aware who increased her norethindrone from 5 mg twice daily to 10 mg twice daily and referred her to a specialist for further recommendations since she cannot proceed with D&C. Patient confirmed that she is taking coumadin as directed by our coumadin clinic.    Patient states she was very dizzy on Sunday morning, 07/11/2019, but the dizziness subsided as the day went on. Patient had no symptoms throughout the afternoon but that evening her left side of her body went numb and limp. Patient reports that she had a TIA back in September 2020. She spoke with her PCP who advised her to go to the emergency department for further evaluation. Patient did not go and has not had any symptoms since this episode. Advised patient to go to the ED if she has any further symptoms. Patient is agreeable.   Please advise of any further recommendations. Thanks!

## 2019-07-14 ENCOUNTER — Other Ambulatory Visit: Payer: Self-pay

## 2019-07-14 ENCOUNTER — Ambulatory Visit: Payer: BC Managed Care – PPO | Admitting: Occupational Therapy

## 2019-07-14 DIAGNOSIS — I890 Lymphedema, not elsewhere classified: Secondary | ICD-10-CM | POA: Diagnosis not present

## 2019-07-14 NOTE — Therapy (Signed)
[name] [facility] SERVICES [address] [place], [place], [address] Phone: [phone]   Fax:  [phone]  Occupational Therapy Treatment  Patient Details  Name: [name] MRN: [id number] Date of Birth: 07-Aug-1956 Referring Provider (OT): Jacqlyn Larsen , MD   Encounter Date: 07/14/2019  OT End of Session - 07/14/19 1634    Visit Number  5    Number of Visits  36    Date for OT Re-Evaluation  09/30/19    OT Start Time  0300    OT Stop Time  0400    OT Time Calculation (min)  60 min    Activity Tolerance  Patient tolerated treatment well;No increased pain;Other (comment)   participation and skilled learning for LE self care limited by sleepiness   Behavior During Therapy  [facility] for tasks assessed/performed       Past Medical History:  Diagnosis Date  . Aortic stenosis 03/23/2013   Overview:  02/18/13 TTE EF >55%. Critical AS with mean Ao valve gradient of 82 mm Hg. No AI. No MR, PR, mild TR. Estimated RVSP 30 mm Hg.  . Bicuspid aortic valve   . Edema of both legs 02/20/2017  . 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 Heart failure ([place])   . HTN (hypertension) 03/23/2013  . 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 Morbid obesity ([place]) 02/20/2017  . Sleep apnea     Past Surgical History:  Procedure Laterality Date  . BUBBLE STUDY  06/07/2019   Procedure: BUBBLE STUDY;  Surgeon: Buford Dresser, MD;  Location: [place];  Service: Cardiovascular;;  . CHOLECYSTECTOMY  1983  . RIGHT/LEFT HEART CATH AND CORONARY ANGIOGRAPHY N/A 04/01/2019   Procedure: RIGHT/LEFT HEART CATH AND CORONARY ANGIOGRAPHY;  Surgeon: Lorretta Harp, MD;  Location: [place] CV LAB;  Service: Cardiovascular;  Laterality: N/A;  . TEE WITHOUT CARDIOVERSION N/A 07/01/2018   Procedure: TRANSESOPHAGEAL ECHOCARDIOGRAM (TEE);  Surgeon: Buford Dresser, MD;  Location: [facility] ENDOSCOPY;  Service: Cardiovascular;  Laterality: N/A;  . TEE WITHOUT  CARDIOVERSION N/A 06/07/2019   Procedure: TRANSESOPHAGEAL ECHOCARDIOGRAM (TEE);  Surgeon: Buford Dresser, MD;  Location: [facility] ENDOSCOPY;  Service: Cardiovascular;  Laterality: N/A;  . TISSUE AORTIC VALVE REPLACEMENT      There were no vitals filed for this visit.  Subjective Assessment - 07/14/19 1514    Subjective   [name] Presents to OT visit 6/36 for intensive phase CDT to BLE. Pt presents with compression wraps in place. She is accompanied by her husband, [name]. Pt presents with knee length compression wraps in place. Pt forgot to bring clean wraps to clinic today. We replaced stockinett and reused compression wraps.    Patient is accompanied by:  Family member    Pertinent History  Complex  H&P with several contributing co-morbidities, including CHF, CAD, HTN, Aortic stenosis s/p bioprosthtic valve replacement, severe OSA (bipap), chronic kidney insufficiency, super obesity    Limitations  difficulty walking, impaired functional mobility and transfers, generalized deconditioning, impaired activity tolerance, decreased endurance, AROM limitations at hips, knees and ankles 2/2 body habitus/ girth at joints,    Repetition  Increases Symptoms    Special Tests  + Stemmer Sign base of toes bilaterally    Pain Onset  More than a month ago                   OT Treatments/Exercises (OP) - 07/14/19 0001      ADLs   ADL Education Given  Yes      Manual Therapy   Manual Therapy  Edema management    Edema Management  suspect new cellulitis infection in RLE today    Manual Lymphatic Drainage (MLD)  MLD to LLE utilining short neck sequence, deep abdominnal pathways and functional inguinal LN in  long sitting w open hip angle.     Compression Bandaging  LLE knee length multilayer gradient compression wrap using 8. 10 and 12 CM short stretch wraps over single layer of stockinett and 0.4 cm Rosidal foam.             OT Education - 07/14/19 1626    Education Details   Reviewed signs and symptoms of cellulitis.Urged Pt to report symptoms and see MD ASAP    Person(s) Educated  Patient;Spouse    Methods  Explanation;Demonstration;Handout    Comprehension  Verbalized understanding;Returned demonstration;Need further instruction          OT Long Term Goals - 07/01/19 1217      OT LONG TERM GOAL #1   Title  Pt will be able to verbalize signs and symptoms of cellulitis infection and identify 4 common lymphedema precautions using printed resource for reference (modified independence) to limit LE progression over time .    Baseline  Max A    Time  4    Period  Days    Status  New    Target Date  --   4th OT Rx visit     OT LONG TERM GOAL #2   Title  Pt will apply BLE, knee length, multi-layer, short stretch compression wraps daily using correct gradient techniques with max CG assist    to achieve optimal limb volume reduction, to return affected limb , as closely as possible, to premorbid size and shape, to limit infection risk, and to improve safe functional ambulation and mobility.    Baseline  dependent    Time  4    Period  Days    Status  New    Target Date  --   4th OT Rx visit     OT LONG TERM GOAL #3   Title  Pt to achieve at least 10% BLE limb volume reductions during Intensive Phase CDT to improve independence and safety with functional performance of basic and instrumental ADLs, functional  mobility and ambulation, and  to limit  infection risk and LE progression.    Baseline  Dependent    Time  12    Period  Weeks    Status  New    Target Date  09/30/19      OT LONG TERM GOAL #4   Title  Pt will achieve and sustain at least 85% compliance with daily LE self-care home program (skin care, lymphatic pumping therex, compression and simple self-MLD) ) with Max CG assistance during Intensive Phase CDT  to limit  limb swelling, reduce infection risk,  limit associated pain , and limit LE progression.    Baseline  dependent    Time  12     Period  Weeks    Status  New    Target Date  09/30/19      OT LONG TERM GOAL #5   Title  Pt will be able to don and doff appropriate compression garments and/ or devices using correct techniques and assistive devices with Max CG A by issue date  for optimal LE management to limit progression over time.    Baseline  12  Status  New    Target Date  09/30/19      Long Term Additional Goals   Additional Long Term Goals  Yes      OT LONG TERM GOAL #6   Title  During self-management phase of CDT Pt will retain limb volume reductions achieved during Intensive Phase CDT with Max CG A with no more than 3% volume increase to limit LE progression and further functional decline.    Baseline  dependent    Time  6    Period  Months    Status  New    Target Date  12/29/19            Plan - 07/14/19 1638    Clinical Impression Statement  Lymphorrhea is nearly resolved. Clearish discharge on non-stick pad most likely lymphatic discharge today. Gentle MLD well tolerated. Noted late in Rx that distal leg skin temp mildly elevated and redness did not resolve today with elevation. Suspect recurrent cellulitis infection. Spouse called PCP requesting antibiotic . Pt has 9 AM visit in the morning. Applied wraps as established counseling Pt and spouse to remove immediately if infections symptoms worsen, pain in leg develops, or if Pt develops a fever. Next visit cancelled and Pt will resume ZOT with wriitten release from her PCP on Monday after she's been on oral antibiotic at least 72 hours.    OT Occupational Profile and History  Comprehensive Assessment- Review of records and extensive additional review of physical, cognitive, psychosocial history related to current functional performance    Occupational performance deficits (Please refer to evaluation for details):  ADL's;Leisure;IADL's;Social Participation;Rest and Sleep;Work;Play    Body Structure / Function / Physical Skills   ADL;Endurance;Obesity;Decreased knowledge of precautions;Balance;Decreased knowledge of use of DME;IADL;Pain;Skin integrity;Cardiopulmonary status limiting activity;Edema;Gait;Mobility;ROM    Rehab Potential  Good    Clinical Decision Making  Multiple treatment options, significant modification of task necessary    Comorbidities Affecting Occupational Performance:  Presence of comorbidities impacting occupational performance    Comorbidities impacting occupational performance description:  multiple, complex comorbidities affecting participation in Intensive Phase Complete Decongestive Therapy protocols with demanding schedule and high burden of care    Modification or Assistance to Complete Evaluation   Max significant modification of tasks or assist is necessary to complete    OT Frequency  3x / week    OT Duration  Other (comment)   3x wk for 4 weeks w/ strong emphasis on CG training, then reduce frequency to 2 x week for remainder of intensive. Cont to support PRN   OT Treatment/Interventions  Self-care/ADL training;Therapeutic exercise;Manual lymph drainage;Coping strategies training;Compression bandaging;Patient/family education;Other (comment);Energy conservation;Therapist, nutritional;Therapeutic activities;DME and/or AE instruction;Manual Therapy    Plan  Complete Decomngestive Therapy: Manual Lymphatic Drainage (MLD) , skin care, compression wraps, therapeutic exercise. Fit with custom compression knee highs and HOS devices.    Consulted and Agree with Plan of Care  Patient;Family member/caregiver       Patient will benefit from skilled therapeutic intervention in order to improve the following deficits and impairments:   Body Structure / Function / Physical Skills: ADL, Endurance, Obesity, Decreased knowledge of precautions, Balance, Decreased knowledge of use of DME, IADL, Pain, Skin integrity, Cardiopulmonary status limiting activity, Edema, Gait, Mobility, ROM       Visit  Diagnosis: Lymphedema, not elsewhere classified    Problem List Patient Active Problem List   Diagnosis Date Noted  . Long term (current) use of anticoagulants 06/11/2019  . TIA (transient ischemic attack)  06/04/2019  . Transient neurologic deficit 06/03/2019  . Anemia 06/03/2019  . Chronic diastolic CHF (congestive heart failure) ([place]) 06/03/2019  . CAD (coronary artery disease) 06/03/2019  . Essential hypertension 06/03/2019  . Sleep apnea 09/25/2018  . At risk for obstructive sleep apnea 08/14/2018  . Obstructive hypertrophic cardiomyopathy ([place]) 07/10/2018  . Dysfunctional uterine bleeding 04/22/2018  . Edema of both legs 02/20/2017  . 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 Morbid obesity ([place]) 02/20/2017  . Aortic stenosis 03/23/2013  . Hypertensive heart disease with heart failure ([place]) 03/23/2013    Andrey Spearman, MS, OTR/L, [facility] 07/14/19 4:43 PM   [place] [name] SERVICES [address] [place], [place], [address] Phone: [phone]   Fax:  [phone]  Name: CELESTER ARMENTEROS MRN: [id number] Date of Birth: 1956/02/15

## 2019-07-14 NOTE — Telephone Encounter (Signed)
Patient informed that Dr. Bettina Gavia has no further recommendations at this time. Patient verbalized understanding. No further questions.

## 2019-07-14 NOTE — Telephone Encounter (Signed)
No other changes.

## 2019-07-16 ENCOUNTER — Ambulatory Visit: Payer: BC Managed Care – PPO | Admitting: Occupational Therapy

## 2019-07-19 ENCOUNTER — Ambulatory Visit: Payer: BC Managed Care – PPO | Admitting: Occupational Therapy

## 2019-07-19 ENCOUNTER — Other Ambulatory Visit: Payer: Self-pay

## 2019-07-19 DIAGNOSIS — I890 Lymphedema, not elsewhere classified: Secondary | ICD-10-CM | POA: Diagnosis not present

## 2019-07-19 NOTE — Therapy (Signed)
[place] MAIN [facility] SERVICES [address] [place], [place], [address] Phone: [phone]   Fax:  [phone]  Occupational Therapy Treatment  Patient Details  Name: [name] MRN: [id number] Date of Birth: 10-06-1955 Referring Provider (OT): Jacqlyn Larsen , MD   Encounter Date: 07/19/2019  OT End of Session - 07/19/19 1615    Visit Number  6    Number of Visits  36    Date for OT Re-Evaluation  09/30/19    OT Start Time  0322    OT Stop Time  0410    OT Time Calculation (min)  48 min    Activity Tolerance  Patient tolerated treatment well;No increased pain;Other (comment)   participation and skilled learning for LE self care limited by sleepiness   Behavior During Therapy  [facility] for tasks assessed/performed       Past Medical History:  Diagnosis Date  . Aortic stenosis 03/23/2013   Overview:  02/18/13 TTE EF >55%. Critical AS with mean Ao valve gradient of 82 mm Hg. No AI. No MR, PR, mild TR. Estimated RVSP 30 mm Hg.  . Bicuspid aortic valve   . Edema of both legs 02/20/2017  . 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 Heart failure ([place])   . HTN (hypertension) 03/23/2013  . 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 Morbid obesity ([place]) 02/20/2017  . Sleep apnea     Past Surgical History:  Procedure Laterality Date  . BUBBLE STUDY  06/07/2019   Procedure: BUBBLE STUDY;  Surgeon: Buford Dresser, MD;  Location: [place];  Service: Cardiovascular;;  . CHOLECYSTECTOMY  1983  . RIGHT/LEFT HEART CATH AND CORONARY ANGIOGRAPHY N/A 04/01/2019   Procedure: RIGHT/LEFT HEART CATH AND CORONARY ANGIOGRAPHY;  Surgeon: Lorretta Harp, MD;  Location: [place] CV LAB;  Service: Cardiovascular;  Laterality: N/A;  . TEE WITHOUT CARDIOVERSION N/A 07/01/2018   Procedure: TRANSESOPHAGEAL ECHOCARDIOGRAM (TEE);  Surgeon: Buford Dresser, MD;  Location: [facility] ENDOSCOPY;  Service: Cardiovascular;  Laterality: N/A;  . TEE WITHOUT  CARDIOVERSION N/A 06/07/2019   Procedure: TRANSESOPHAGEAL ECHOCARDIOGRAM (TEE);  Surgeon: Buford Dresser, MD;  Location: [facility] ENDOSCOPY;  Service: Cardiovascular;  Laterality: N/A;  . TISSUE AORTIC VALVE REPLACEMENT      There were no vitals filed for this visit.  Subjective Assessment - 07/19/19 1612    Subjective   [name] Presents to OT visit 7/36 for intensive phase CDT to BLE. Pt presents with compression wraps in place. She is accompanied by her husband, [name]. Pt presents with knee length compression wraps in place. Pt reports she saw her PCPthe day after our last visit and he issued her a perscription for Keflex     for cellulitis. Pt brings medical release today stating se is cleared to resume OT for CDT.    Patient is accompanied by:  Family member    Pertinent History  Complex  H&P with several contributing co-morbidities, including CHF, CAD, HTN, Aortic stenosis s/p bioprosthtic valve replacement, severe OSA (bipap), chronic kidney insufficiency, super obesity    Limitations  difficulty walking, impaired functional mobility and transfers, generalized deconditioning, impaired activity tolerance, decreased endurance, AROM limitations at hips, knees and ankles 2/2 body habitus/ girth at joints,    Repetition  Increases Symptoms    Special Tests  + Stemmer Sign base of toes bilaterally    Pain Onset  More than a month ago  OT Treatments/Exercises (OP) - 07/19/19 0001      ADLs   ADL Education Given  Yes      Manual Therapy   Manual Therapy  Edema management;Manual Lymphatic Drainage (MLD);Compression Bandaging    Edema Management  skin care to LLE throughout MLD using low ph castor oil to increase skin hydration and mobility to decrease infection risk and facilitate improved lymphatic function    Manual Lymphatic Drainage (MLD)  MLD to LLE utilining short neck sequence, deep abdominnal pathways and functional inguinal LN in  long sitting w open  hip angle.     Compression Bandaging  LLE knee length multilayer gradient compression wrap using 8. 10 and 12 CM short stretch wraps over single layer of stockinett and 0.4 cm Rosidal foam.             OT Education - 07/19/19 1615    Education Details  Continued skilled Pt/caregiver education  And LE ADL training throughout visit for lymphedema self care/ home program, including compression wrapping, compression garment and device wear/care, lymphatic pumping ther ex, simple self-MLD, and skin care. Discussed progress towards goals.    Person(s) Educated  Patient;Spouse    Methods  Explanation;Demonstration;Handout    Comprehension  Verbalized understanding;Returned demonstration;Need further instruction          OT Long Term Goals - 07/01/19 1217      OT LONG TERM GOAL #1   Title  Pt will be able to verbalize signs and symptoms of cellulitis infection and identify 4 common lymphedema precautions using printed resource for reference (modified independence) to limit LE progression over time .    Baseline  Max A    Time  4    Period  Days    Status  New    Target Date  --   4th OT Rx visit     OT LONG TERM GOAL #2   Title  Pt will apply BLE, knee length, multi-layer, short stretch compression wraps daily using correct gradient techniques with max CG assist    to achieve optimal limb volume reduction, to return affected limb , as closely as possible, to premorbid size and shape, to limit infection risk, and to improve safe functional ambulation and mobility.    Baseline  dependent    Time  4    Period  Days    Status  New    Target Date  --   4th OT Rx visit     OT LONG TERM GOAL #3   Title  Pt to achieve at least 10% BLE limb volume reductions during Intensive Phase CDT to improve independence and safety with functional performance of basic and instrumental ADLs, functional  mobility and ambulation, and  to limit  infection risk and LE progression.    Baseline  Dependent     Time  12    Period  Weeks    Status  New    Target Date  09/30/19      OT LONG TERM GOAL #4   Title  Pt will achieve and sustain at least 85% compliance with daily LE self-care home program (skin care, lymphatic pumping therex, compression and simple self-MLD) ) with Max CG assistance during Intensive Phase CDT  to limit  limb swelling, reduce infection risk,  limit associated pain , and limit LE progression.    Baseline  dependent    Time  12    Period  Weeks    Status  New  Target Date  09/30/19      OT LONG TERM GOAL #5   Title  Pt will be able to don and doff appropriate compression garments and/ or devices using correct techniques and assistive devices with Max CG A by issue date  for optimal LE management to limit progression over time.    Baseline  12    Status  New    Target Date  09/30/19      Long Term Additional Goals   Additional Long Term Goals  Yes      OT LONG TERM GOAL #6   Title  During self-management phase of CDT Pt will retain limb volume reductions achieved during Intensive Phase CDT with Max CG A with no more than 3% volume increase to limit LE progression and further functional decline.    Baseline  dependent    Time  6    Period  Months    Status  New    Target Date  12/29/19            Plan - 07/19/19 1616    Clinical Impression Statement  Signs and symptoms of cellulitis infection below knee in LLE are markedly reduced today, incuding decreased skin temp, blanching, swelling and redness. Slight redness persists from dista leg to ankle. Increased skin wrinkles observed today, but mild skin tightmness persists. Lymphorrhea is also persistent, despite compression wrapping. Will consider adding increased foam next session. MLD abbreviated today due to Pt being late to session. No difficulty tolerating manual therapy or comnpression wraps reapplied today. Cont as per POC.       Patient will benefit from skilled therapeutic intervention in order to  improve the following deficits and impairments:           Visit Diagnosis: Lymphedema, not elsewhere classified    Problem List Patient Active Problem List   Diagnosis Date Noted  . Long term (current) use of anticoagulants 06/11/2019  . TIA (transient ischemic attack) 06/04/2019  . Transient neurologic deficit 06/03/2019  . Anemia 06/03/2019  . Chronic diastolic CHF (congestive heart failure) ([place]) 06/03/2019  . CAD (coronary artery disease) 06/03/2019  . Essential hypertension 06/03/2019  . Sleep apnea 09/25/2018  . At risk for obstructive sleep apnea 08/14/2018  . Obstructive hypertrophic cardiomyopathy ([place]) 07/10/2018  . Dysfunctional uterine bleeding 04/22/2018  . Edema of both legs 02/20/2017  . 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 Morbid obesity ([place]) 02/20/2017  . Aortic stenosis 03/23/2013  . Hypertensive heart disease with heart failure ([place]) 03/23/2013   Andrey Spearman, MS, OTR/L, [facility] 07/19/19 4:20 PM  [place] [facility] SERVICES [address] [place], [place], [address] Phone: [phone]   Fax:  [phone]  Name: [name] MRN: [id number] Date of Birth: [birth year]

## 2019-07-21 ENCOUNTER — Ambulatory Visit: Payer: BC Managed Care – PPO | Admitting: Occupational Therapy

## 2019-07-21 ENCOUNTER — Other Ambulatory Visit: Payer: Self-pay

## 2019-07-21 DIAGNOSIS — I890 Lymphedema, not elsewhere classified: Secondary | ICD-10-CM | POA: Diagnosis not present

## 2019-07-21 DIAGNOSIS — I480 Paroxysmal atrial fibrillation: Secondary | ICD-10-CM | POA: Insufficient documentation

## 2019-07-21 NOTE — Therapy (Signed)
[place] MAIN [facility] SERVICES [address] [place], [place], [address] Phone: [phone]   Fax:  [phone]  Occupational Therapy Treatment  Patient Details  Name: [name] MRN: [id number] Date of Birth: August 01, 1956 Referring Provider (OT): Jacqlyn Larsen , MD   Encounter Date: 07/21/2019  OT End of Session - 07/21/19 1616    Visit Number  7    Number of Visits  36    Date for OT Re-Evaluation  09/30/19    OT Start Time  0315    OT Stop Time  0410    OT Time Calculation (min)  55 min    Activity Tolerance  Patient tolerated treatment well;No increased pain;Other (comment)   participation and skilled learning for LE self care limited by sleepiness   Behavior During Therapy  [facility] for tasks assessed/performed       Past Medical History:  Diagnosis Date  . Aortic stenosis 03/23/2013   Overview:  02/18/13 TTE EF >55%. Critical AS with mean Ao valve gradient of 82 mm Hg. No AI. No MR, PR, mild TR. Estimated RVSP 30 mm Hg.  . Bicuspid aortic valve   . Edema of both legs 02/20/2017  . 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 Heart failure ([place])   . HTN (hypertension) 03/23/2013  . 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 Morbid obesity ([place]) 02/20/2017  . Sleep apnea     Past Surgical History:  Procedure Laterality Date  . BUBBLE STUDY  06/07/2019   Procedure: BUBBLE STUDY;  Surgeon: Buford Dresser, MD;  Location: [place];  Service: Cardiovascular;;  . CHOLECYSTECTOMY  1983  . RIGHT/LEFT HEART CATH AND CORONARY ANGIOGRAPHY N/A 04/01/2019   Procedure: RIGHT/LEFT HEART CATH AND CORONARY ANGIOGRAPHY;  Surgeon: Lorretta Harp, MD;  Location: [place] CV LAB;  Service: Cardiovascular;  Laterality: N/A;  . TEE WITHOUT CARDIOVERSION N/A 07/01/2018   Procedure: TRANSESOPHAGEAL ECHOCARDIOGRAM (TEE);  Surgeon: Buford Dresser, MD;  Location: [facility] ENDOSCOPY;  Service: Cardiovascular;  Laterality: N/A;  . TEE WITHOUT  CARDIOVERSION N/A 06/07/2019   Procedure: TRANSESOPHAGEAL ECHOCARDIOGRAM (TEE);  Surgeon: Buford Dresser, MD;  Location: [facility] ENDOSCOPY;  Service: Cardiovascular;  Laterality: N/A;  . TISSUE AORTIC VALVE REPLACEMENT      There were no vitals filed for this visit.  Subjective Assessment - 07/21/19 1519    Subjective   [name] presents to OT for Rx visit 8/36 to address BLE lymphedema. Pt denies pain in legs today. Pt states she's tolerating compression wraps without difficulty today. Pt sleeps throughout session.    Patient is accompanied by:  Family member                   OT Treatments/Exercises (OP) - 07/21/19 0001      ADLs   ADL Education Given  Yes      Manual Therapy   Manual Therapy  Edema management;Manual Lymphatic Drainage (MLD);Compression Bandaging    Edema Management  skin care to LLE throughout MLD using low ph castor oil to increase skin hydration and mobility to decrease infection risk and facilitate improved lymphatic function    Manual Lymphatic Drainage (MLD)  MLD to LLE utilining short neck sequence, deep abdominnal pathways and functional inguinal LN in  long sitting w open hip angle.     Compression Bandaging  LLE knee length multilayer gradient compression wrap using 8. 10 and 12 CM short stretch wraps over single layer of stockinett and 0.4 cm Rosidal foam.  OT Education - 07/21/19 1616    Education Details  Continued skilled Pt/caregiver education  And LE ADL training throughout visit for lymphedema self care/ home program, including compression wrapping, compression garment and device wear/care, lymphatic pumping ther ex, simple self-MLD, and skin care. Discussed progress towards goals.    Person(s) Educated  Patient;Spouse    Methods  Explanation;Demonstration;Handout    Comprehension  Verbalized understanding;Returned demonstration;Need further instruction          OT Long Term Goals - 07/01/19 1217      OT LONG  TERM GOAL #1   Title  Pt will be able to verbalize signs and symptoms of cellulitis infection and identify 4 common lymphedema precautions using printed resource for reference (modified independence) to limit LE progression over time .    Baseline  Max A    Time  4    Period  Days    Status  New    Target Date  --   4th OT Rx visit     OT LONG TERM GOAL #2   Title  Pt will apply BLE, knee length, multi-layer, short stretch compression wraps daily using correct gradient techniques with max CG assist    to achieve optimal limb volume reduction, to return affected limb , as closely as possible, to premorbid size and shape, to limit infection risk, and to improve safe functional ambulation and mobility.    Baseline  dependent    Time  4    Period  Days    Status  New    Target Date  --   4th OT Rx visit     OT LONG TERM GOAL #3   Title  Pt to achieve at least 10% BLE limb volume reductions during Intensive Phase CDT to improve independence and safety with functional performance of basic and instrumental ADLs, functional  mobility and ambulation, and  to limit  infection risk and LE progression.    Baseline  Dependent    Time  12    Period  Weeks    Status  New    Target Date  09/30/19      OT LONG TERM GOAL #4   Title  Pt will achieve and sustain at least 85% compliance with daily LE self-care home program (skin care, lymphatic pumping therex, compression and simple self-MLD) ) with Max CG assistance during Intensive Phase CDT  to limit  limb swelling, reduce infection risk,  limit associated pain , and limit LE progression.    Baseline  dependent    Time  12    Period  Weeks    Status  New    Target Date  09/30/19      OT LONG TERM GOAL #5   Title  Pt will be able to don and doff appropriate compression garments and/ or devices using correct techniques and assistive devices with Max CG A by issue date  for optimal LE management to limit progression over time.    Baseline  12     Status  New    Target Date  09/30/19      Long Term Additional Goals   Additional Long Term Goals  Yes      OT LONG TERM GOAL #6   Title  During self-management phase of CDT Pt will retain limb volume reductions achieved during Intensive Phase CDT with Max CG A with no more than 3% volume increase to limit LE progression and further functional decline.  Baseline  dependent    Time  6    Period  Months    Status  New    Target Date  12/29/19            Plan - 07/21/19 1617    Clinical Impression Statement  Despite LLE knee length gradient compression wraps, Bilateral Leg swelling increased  today, most likely due to long car ride to/ from Dr visits neary 2 hours each way. Wounds on anterior and posterior LLE are healing well. No lymphorrhea observed today. Pt tolerated LLE MLD and compression wraps reapplied without difficulty in clinic. Cont as per POC. Complete comparative volumetrics to LLE late next week in prep for progress report.       Patient will benefit from skilled therapeutic intervention in order to improve the following deficits and impairments:           Visit Diagnosis: Lymphedema, not elsewhere classified    Problem List Patient Active Problem List   Diagnosis Date Noted  . Long term (current) use of anticoagulants 06/11/2019  . TIA (transient ischemic attack) 06/04/2019  . Transient neurologic deficit 06/03/2019  . Anemia 06/03/2019  . Chronic diastolic CHF (congestive heart failure) ([place]) 06/03/2019  . CAD (coronary artery disease) 06/03/2019  . Essential hypertension 06/03/2019  . Sleep apnea 09/25/2018  . At risk for obstructive sleep apnea 08/14/2018  . Obstructive hypertrophic cardiomyopathy ([place]) 07/10/2018  . Dysfunctional uterine bleeding 04/22/2018  . Edema of both legs 02/20/2017  . 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 Morbid obesity ([place]) 02/20/2017  . Aortic stenosis 03/23/2013   . Hypertensive heart disease with heart failure ([place]) 03/23/2013    Andrey Spearman, MS, OTR/L, [facility] 07/21/19 4:21 PM   [place] MAIN [facility] SERVICES [address] [place], [place], [address] Phone: [phone]   Fax:  [phone]  Name: [name] MRN: [id number] Date of Birth: Jan 18, 1956

## 2019-07-26 ENCOUNTER — Ambulatory Visit: Payer: BC Managed Care – PPO | Admitting: Occupational Therapy

## 2019-07-26 ENCOUNTER — Other Ambulatory Visit: Payer: Self-pay

## 2019-07-26 DIAGNOSIS — I890 Lymphedema, not elsewhere classified: Secondary | ICD-10-CM | POA: Diagnosis not present

## 2019-07-27 NOTE — Therapy (Signed)
[place] MAIN [facility] SERVICES [address] [place], [place], [address] Phone: [phone]   Fax:  [phone]  Occupational Therapy Treatment  Patient Details  Name: [name] MRN: [id number] Date of Birth: [birth year] Referring Provider (OT): Jacqlyn Larsen , MD   Encounter Date: 07/26/2019  OT End of Session - 07/27/19 1311    Visit Number  8    Number of Visits  36    Date for OT Re-Evaluation  09/30/19    OT Start Time  0315    OT Stop Time  0420    OT Time Calculation (min)  65 min    Activity Tolerance  Patient tolerated treatment well;No increased pain;Other (comment)   participation and skilled learning for LE self care limited by sleepiness   Behavior During Therapy  [facility] for tasks assessed/performed       Past Medical History:  Diagnosis Date  . Aortic stenosis 03/23/2013   Overview:  02/18/13 TTE EF >55%. Critical AS with mean Ao valve gradient of 82 mm Hg. No AI. No MR, PR, mild TR. Estimated RVSP 30 mm Hg.  . Bicuspid aortic valve   . Edema of both legs 02/20/2017  . 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 Heart failure ([place])   . HTN (hypertension) 03/23/2013  . 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 Morbid obesity ([place]) 02/20/2017  . Sleep apnea     Past Surgical History:  Procedure Laterality Date  . BUBBLE STUDY  06/07/2019   Procedure: BUBBLE STUDY;  Surgeon: Buford Dresser, MD;  Location: [place];  Service: Cardiovascular;;  . CHOLECYSTECTOMY  1983  . RIGHT/LEFT HEART CATH AND CORONARY ANGIOGRAPHY N/A 04/01/2019   Procedure: RIGHT/LEFT HEART CATH AND CORONARY ANGIOGRAPHY;  Surgeon: Lorretta Harp, MD;  Location: [place] CV LAB;  Service: Cardiovascular;  Laterality: N/A;  . TEE WITHOUT CARDIOVERSION N/A 07/01/2018   Procedure: TRANSESOPHAGEAL ECHOCARDIOGRAM (TEE);  Surgeon: Buford Dresser, MD;  Location: [facility] ENDOSCOPY;  Service: Cardiovascular;  Laterality: N/A;  . TEE WITHOUT  CARDIOVERSION N/A 06/07/2019   Procedure: TRANSESOPHAGEAL ECHOCARDIOGRAM (TEE);  Surgeon: Buford Dresser, MD;  Location: [facility] ENDOSCOPY;  Service: Cardiovascular;  Laterality: N/A;  . TISSUE AORTIC VALVE REPLACEMENT      There were no vitals filed for this visit.  Subjective Assessment - 07/26/19 1522    Subjective   [name] presents to OT for Rx visit 9/36 to address BLE lymphedema. Pt denies pain in legs today. [name]. She sleeps through most of session.    Patient is accompanied by:  Family member          LYMPHEDEMA/ONCOLOGY QUESTIONNAIRE - 07/27/19 1312      Left Lower Extremity Lymphedema   Other  LLE A-D= 8195 ml. LLE luimb volume below the knee measured immediately after removing short stretch wraps  reveals a 25.15% limb volume  increase below the knee since initially measured on 07/26/19              OT Treatments/Exercises (OP) - 07/27/19 0001      ADLs   ADL Education Given  Yes      Manual Therapy   Manual Therapy  Edema management;Manual Lymphatic Drainage (MLD);Compression Bandaging    Manual therapy comments  LLE comparative limb volumetrics    Edema Management  skin care to LLE throughout MLD using low ph castor oil to increase skin hydration and mobility to decrease infection risk and facilitate improved  lymphatic function    Manual Lymphatic Drainage (MLD)  MLD to LLE utilining short neck sequence, deep abdominnal pathways and functional inguinal LN in  long sitting w open hip angle.     Compression Bandaging  no short stretch avaialble in clinic   as Pt forgot  compression wraps again today. Existing compression wraps in place are too wet with lymphorrhea to reuse. Instead used non-stick telfa pads ov anterior and posterio leg wounds , and 2 abdominal pads over anterior leg wound woth catch running lymphorrhea. Applied 4" wide CoBan lightly over all to mid calf to hold padding in place. Encouraged spouse to apply short stretch  wraps once at home after session.              OT Education - 07/27/19 1310    Education Details  Pt and family edu re how global edema from systemic causes impack lymphedema and chronic limb swelling as well as wound healing    Person(s) Educated  Patient;Spouse    Methods  Explanation;Demonstration;Handout    Comprehension  Verbalized understanding;Returned demonstration;Need further instruction          OT Long Term Goals - 07/01/19 1217      OT LONG TERM GOAL #1   Title  Pt will be able to verbalize signs and symptoms of cellulitis infection and identify 4 common lymphedema precautions using printed resource for reference (modified independence) to limit LE progression over time .    Baseline  Max A    Time  4    Period  Days    Status  New    Target Date  --   4th OT Rx visit     OT LONG TERM GOAL #2   Title  Pt will apply BLE, knee length, multi-layer, short stretch compression wraps daily using correct gradient techniques with max CG assist    to achieve optimal limb volume reduction, to return affected limb , as closely as possible, to premorbid size and shape, to limit infection risk, and to improve safe functional ambulation and mobility.    Baseline  dependent    Time  4    Period  Days    Status  New    Target Date  --   4th OT Rx visit     OT LONG TERM GOAL #3   Title  Pt to achieve at least 10% BLE limb volume reductions during Intensive Phase CDT to improve independence and safety with functional performance of basic and instrumental ADLs, functional  mobility and ambulation, and  to limit  infection risk and LE progression.    Baseline  Dependent    Time  12    Period  Weeks    Status  New    Target Date  09/30/19      OT LONG TERM GOAL #4   Title  Pt will achieve and sustain at least 85% compliance with daily LE self-care home program (skin care, lymphatic pumping therex, compression and simple self-MLD) ) with Max CG assistance during Intensive  Phase CDT  to limit  limb swelling, reduce infection risk,  limit associated pain , and limit LE progression.    Baseline  dependent    Time  12    Period  Weeks    Status  New    Target Date  09/30/19      OT LONG TERM GOAL #5   Title  Pt will be able to don and doff appropriate compression  garments and/ or devices using correct techniques and assistive devices with Max CG A by issue date  for optimal LE management to limit progression over time.    Baseline  12    Status  New    Target Date  09/30/19      Long Term Additional Goals   Additional Long Term Goals  Yes      OT LONG TERM GOAL #6   Title  During self-management phase of CDT Pt will retain limb volume reductions achieved during Intensive Phase CDT with Max CG A with no more than 3% volume increase to limit LE progression and further functional decline.    Baseline  dependent    Time  6    Period  Months    Status  New    Target Date  12/29/19            Plan - 07/26/19 1314    Clinical Impression Statement  LLE A-D= 8195 ml. LLE luimb volume below the knee measured immediately after removing short stretch wraps  reveals a 25.15% limb volume  increase below the knee since initially measured on 07/26/19. Lymphorrhea is nearly contant at anterior leg near wound, which appears to be healing. I suspect this dramatic increase in limb volume is systemic and related to multiple comorbidities.. Pt and family instructed to report symptoms and discuss optimal management of fluid overload with PCP, especially in light of recent  diuretic med changes when hospitalized and afterwards. Unable to reapply short stretch compression wraps worn to clinic today as they are soaked through with lymphorrhea. Since Pt/ family did not bring 2nd set of wraps today we applied2 abdominal pads over atefa non stick pads at anterior and posterior leg, and covered all with cowrap applied gently without strong compression to avoid it rolling down and  causing tourniquet effect. Advised Pt to reapply dry short stretch wraps over dry rosidal foam ASAP at home.       Patient will benefit from skilled therapeutic intervention in order to improve the following deficits and impairments:           Visit Diagnosis: Lymphedema, not elsewhere classified    Problem List Patient Active Problem List   Diagnosis Date Noted  . Long term (current) use of anticoagulants 06/11/2019  . TIA (transient ischemic attack) 06/04/2019  . Transient neurologic deficit 06/03/2019  . Anemia 06/03/2019  . Chronic diastolic CHF (congestive heart failure) ([place]) 06/03/2019  . CAD (coronary artery disease) 06/03/2019  . Essential hypertension 06/03/2019  . Sleep apnea 09/25/2018  . At risk for obstructive sleep apnea 08/14/2018  . Obstructive hypertrophic cardiomyopathy ([place]) 07/10/2018  . Dysfunctional uterine bleeding 04/22/2018  . Edema of both legs 02/20/2017  . 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 Morbid obesity ([place]) 02/20/2017  . Aortic stenosis 03/23/2013  . Hypertensive heart disease with heart failure ([place]) 03/23/2013    Andrey Spearman, MS, OTR/L, [facility] 07/27/19 1:22 PM  [place] MAIN [facility] SERVICES [address] [place], [place], [address] Phone: [phone]   Fax:  [phone]  Name: [name] MRN: [id number] Date of Birth: [birth year]

## 2019-07-28 ENCOUNTER — Other Ambulatory Visit: Payer: Self-pay

## 2019-07-28 ENCOUNTER — Encounter: Payer: Self-pay | Admitting: Occupational Therapy

## 2019-07-28 ENCOUNTER — Ambulatory Visit: Payer: BC Managed Care – PPO | Admitting: Occupational Therapy

## 2019-07-28 DIAGNOSIS — I890 Lymphedema, not elsewhere classified: Secondary | ICD-10-CM

## 2019-07-28 NOTE — Therapy (Signed)
[place] MAIN [facility] [address] [place], [place], [address] Phone: [phone]   Fax:  [phone]  Occupational Therapy Treatment  Patient Details  Name: [name] MRN: [id number] Date of Birth: 15-Dec-1955 Referring Provider (OT): Jacqlyn Larsen , MD   Encounter Date: 07/28/2019  OT End of Session - 07/28/19 1613    Visit Number  9    Number of Visits  36    Date for OT Re-Evaluation  09/30/19    OT Start Time  0310    OT Stop Time  0410    OT Time Calculation (min)  60 min    Activity Tolerance  Patient tolerated treatment well;No increased pain;Other (comment)   participation and skilled learning for LE self care limited by sleepiness   Behavior During Therapy  [facility] for tasks assessed/performed       Past Medical History:  Diagnosis Date  . Aortic stenosis 03/23/2013   Overview:  02/18/13 TTE EF >55%. Critical AS with mean Ao valve gradient of 82 mm Hg. No AI. No MR, PR, mild TR. Estimated RVSP 30 mm Hg.  . Bicuspid aortic valve   . Edema of both legs 02/20/2017  . 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 Heart failure ([place])   . HTN (hypertension) 03/23/2013  . 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 Morbid obesity ([place]) 02/20/2017  . Sleep apnea     Past Surgical History:  Procedure Laterality Date  . BUBBLE STUDY  06/07/2019   Procedure: BUBBLE STUDY;  Surgeon: Buford Dresser, MD;  Location: [place];  Service: Cardiovascular;;  . CHOLECYSTECTOMY  1983  . RIGHT/LEFT HEART CATH AND CORONARY ANGIOGRAPHY N/A 04/01/2019   Procedure: RIGHT/LEFT HEART CATH AND CORONARY ANGIOGRAPHY;  Surgeon: Lorretta Harp, MD;  Location: [place] CV LAB;  Service: Cardiovascular;  Laterality: N/A;  . TEE WITHOUT CARDIOVERSION N/A 07/01/2018   Procedure: TRANSESOPHAGEAL ECHOCARDIOGRAM (TEE);  Surgeon: Buford Dresser, MD;  Location: [facility] ENDOSCOPY;  Service: Cardiovascular;  Laterality: N/A;  . TEE WITHOUT  CARDIOVERSION N/A 06/07/2019   Procedure: TRANSESOPHAGEAL ECHOCARDIOGRAM (TEE);  Surgeon: Buford Dresser, MD;  Location: [facility] ENDOSCOPY;  Service: Cardiovascular;  Laterality: N/A;  . TISSUE AORTIC VALVE REPLACEMENT      There were no vitals filed for this visit.  Subjective Assessment - 07/28/19 1515    Subjective   [name] presents to OT for Rx visit 9/36 to address BLE lymphedema. Pt denies pain in legs today. [name], [name], who states, "The leaking is a lot better today."    Patient is accompanied by:  Family member                   OT Treatments/Exercises (OP) - 07/28/19 0001      ADLs   ADL Education Given  Yes  (Pended)       Manual Therapy   Manual Therapy  Edema management  (Pended)     Edema Management  skin care to LLE throughout MLD using low ph castor oil to increase skin hydration and mobility to decrease infection risk and facilitate improved lymphatic function  (Pended)     Manual Lymphatic Drainage (MLD)  MLD to LLE utilining short neck sequence, deep abdominnal pathways and functional inguinal LN in  long sitting w open hip angle.   (Pended)     Compression Bandaging  no short stretch avaialble in clinic   as Pt forgot  compression wraps again  today. Existing compression wraps in place are too wet with lymphorrhea to reuse. Instead used non-stick telfa pads ov anterior and posterio leg wounds , and 2 abdominal pads over anterior leg wound woth catch running lymphorrhea. Applied 4" wide CoBan lightly over all to mid calf to hold padding in place. Encouraged spouse to apply short stretch wraps once at home after session.   (Pended)              OT Education - 07/28/19 1612    Education Details  Continued skilled Pt/caregiver education  And LE ADL training throughout visit for lymphedema self care/ home program, including compression wrapping, compression garment and device wear/care, lymphatic pumping ther ex, simple self-MLD,  and skin care. Discussed progress towards goals.    Person(s) Educated  Patient;Spouse    Methods  Explanation;Demonstration;Handout    Comprehension  Verbalized understanding;Returned demonstration;Need further instruction          OT Long Term Goals - 07/01/19 1217      OT LONG TERM GOAL #1   Title  Pt will be able to verbalize signs and symptoms of cellulitis infection and identify 4 common lymphedema precautions using printed resource for reference (modified independence) to limit LE progression over time .    Baseline  Max A    Time  4    Period  Days    Status  New    Target Date  --   4th OT Rx visit     OT LONG TERM GOAL #2   Title  Pt will apply BLE, knee length, multi-layer, short stretch compression wraps daily using correct gradient techniques with max CG assist    to achieve optimal limb volume reduction, to return affected limb , as closely as possible, to premorbid size and shape, to limit infection risk, and to improve safe functional ambulation and mobility.    Baseline  dependent    Time  4    Period  Days    Status  New    Target Date  --   4th OT Rx visit     OT LONG TERM GOAL #3   Title  Pt to achieve at least 10% BLE limb volume reductions during Intensive Phase CDT to improve independence and safety with functional performance of basic and instrumental ADLs, functional  mobility and ambulation, and  to limit  infection risk and LE progression.    Baseline  Dependent    Time  12    Period  Weeks    Status  New    Target Date  09/30/19      OT LONG TERM GOAL #4   Title  Pt will achieve and sustain at least 85% compliance with daily LE self-care home program (skin care, lymphatic pumping therex, compression and simple self-MLD) ) with Max CG assistance during Intensive Phase CDT  to limit  limb swelling, reduce infection risk,  limit associated pain , and limit LE progression.    Baseline  dependent    Time  12    Period  Weeks    Status  New    Target  Date  09/30/19      OT LONG TERM GOAL #5   Title  Pt will be able to don and doff appropriate compression garments and/ or devices using correct techniques and assistive devices with Max CG A by issue date  for optimal LE management to limit progression over time.    Baseline  12  Status  New    Target Date  09/30/19      Long Term Additional Goals   Additional Long Term Goals  Yes      OT LONG TERM GOAL #6   Title  During self-management phase of CDT Pt will retain limb volume reductions achieved during Intensive Phase CDT with Max CG A with no more than 3% volume increase to limit LE progression and further functional decline.    Baseline  dependent    Time  6    Period  Months    Status  New    Target Date  12/29/19            Plan - 07/28/19 1613    Clinical Impression Statement  Compression wraps on LLE are demp today, but dressings next to skin are dry. Lymphorrhea originating at fistula anterior LLE appears to be resolved at present. . Leg remains densly congested, but skin is slightly more mobile today during MLD. Skin on posterior L leg is dry and prickley. Posterior leg wounds are closed by spouse report. Pt slept through MLD and compressin wrapping. Progress is slow and volume remains increased compared with initial presentation despite ongoing compression. Cont to monitor skin condition and swelling carefully for signs/ symptoms of additional breakdown and infection. Monitor SOB and urge Pt to report to MD if it worsens.       Patient will benefit from skilled therapeutic intervention in order to improve the following deficits and impairments:           Visit Diagnosis: Lymphedema, not elsewhere classified    Problem List Patient Active Problem List   Diagnosis Date Noted  . Long term (current) use of anticoagulants 06/11/2019  . TIA (transient ischemic attack) 06/04/2019  . Transient neurologic deficit 06/03/2019  . Anemia 06/03/2019  . Chronic  diastolic CHF (congestive heart failure) ([place]) 06/03/2019  . CAD (coronary artery disease) 06/03/2019  . Essential hypertension 06/03/2019  . Sleep apnea 09/25/2018  . At risk for obstructive sleep apnea 08/14/2018  . Obstructive hypertrophic cardiomyopathy ([place]) 07/10/2018  . Dysfunctional uterine bleeding 04/22/2018  . Edema of both legs 02/20/2017  . 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 Morbid obesity ([place]) 02/20/2017  . Aortic stenosis 03/23/2013  . Hypertensive heart disease with heart failure ([place]) 03/23/2013    Andrey Spearman, MS, OTR/L, [facility] 07/28/19 4:21 PM  [place] MAIN [facility] SERVICES [address] [place], [place], [address] Phone: [phone]   Fax:  [phone]  Name: [name] MRN: [id number] Date of Birth: 1956-03-18

## 2019-07-30 ENCOUNTER — Encounter: Payer: BC Managed Care – PPO | Admitting: Occupational Therapy

## 2019-08-02 ENCOUNTER — Encounter: Payer: BC Managed Care – PPO | Admitting: Occupational Therapy

## 2019-08-03 ENCOUNTER — Ambulatory Visit: Payer: BC Managed Care – PPO | Admitting: Pulmonary Disease

## 2019-08-04 ENCOUNTER — Other Ambulatory Visit: Payer: Self-pay

## 2019-08-04 ENCOUNTER — Ambulatory Visit: Payer: BC Managed Care – PPO | Admitting: Occupational Therapy

## 2019-08-04 DIAGNOSIS — I890 Lymphedema, not elsewhere classified: Secondary | ICD-10-CM | POA: Diagnosis not present

## 2019-08-04 NOTE — Therapy (Signed)
[place] MAIN [facility] SERVICES [address] [place], [place], [address] Phone: [phone]   Fax:  [phone]  Occupational Therapy Treatment Note and Progress Report:  Lymphedema Care  Patient Details  Name: [name] MRN: [id number] Date of Birth: [birth year] Referring Provider (OT): Jacqlyn Larsen , MD   Encounter Date: 08/04/2019  OT End of Session - 08/04/19 1000    Visit Number  10    Number of Visits  36    Date for OT Re-Evaluation  09/30/19    OT Start Time  0205    OT Stop Time  0300    OT Time Calculation (min)  55 min    Activity Tolerance  Patient tolerated treatment well;No increased pain;Other (comment)   participation and skilled learning for LE self care limited by sleepiness   Behavior During Therapy  [facility] for tasks assessed/performed       Past Medical History:  Diagnosis Date  . Aortic stenosis 03/23/2013   Overview:  02/18/13 TTE EF >55%. Critical AS with mean Ao valve gradient of 82 mm Hg. No AI. No MR, PR, mild TR. Estimated RVSP 30 mm Hg.  . Bicuspid aortic valve   . Edema of both legs 02/20/2017  . 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 Heart failure ([place])   . HTN (hypertension) 03/23/2013  . 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 Morbid obesity ([place]) 02/20/2017  . Sleep apnea     Past Surgical History:  Procedure Laterality Date  . BUBBLE STUDY  06/07/2019   Procedure: BUBBLE STUDY;  Surgeon: Buford Dresser, MD;  Location: [place];  Service: Cardiovascular;;  . CHOLECYSTECTOMY  1983  . RIGHT/LEFT HEART CATH AND CORONARY ANGIOGRAPHY N/A 04/01/2019   Procedure: RIGHT/LEFT HEART CATH AND CORONARY ANGIOGRAPHY;  Surgeon: Lorretta Harp, MD;  Location: [place] CV LAB;  Service: Cardiovascular;  Laterality: N/A;  . TEE WITHOUT CARDIOVERSION N/A 07/01/2018   Procedure: TRANSESOPHAGEAL ECHOCARDIOGRAM (TEE);  Surgeon: Buford Dresser, MD;  Location: [facility] ENDOSCOPY;  Service:  Cardiovascular;  Laterality: N/A;  . TEE WITHOUT CARDIOVERSION N/A 06/07/2019   Procedure: TRANSESOPHAGEAL ECHOCARDIOGRAM (TEE);  Surgeon: Buford Dresser, MD;  Location: [facility] ENDOSCOPY;  Service: Cardiovascular;  Laterality: N/A;  . TISSUE AORTIC VALVE REPLACEMENT      There were no vitals filed for this visit.  Subjective Assessment - 08/04/19 1416    Subjective   Mrs [place] presents to OT for Rx visit 10/36 to address BLE lymphedema. Pt sleeps through majority of session.  Pt is accompanied by her daughter today.Compression wraps are in place when Pt arrives.    Patient is accompanied by:  Family member          LYMPHEDEMA/ONCOLOGY QUESTIONNAIRE - 08/04/19 1635      Left Lower Extremity Lymphedema   Other  LLE limb volume from ankle to tibial tuberosity measures 8733.29 ml    Other  LLE limb volume is increased 6.56% since last measures on 07/26/19, when it had increased by 25.15% since commencing CDT. Overall LLE limb volume increase since   commencing OT for CDT measures 33%, which is a dramatic increase , especially considering the agressive compression wrapping Pt is performing in clinic and between sessions.              OT Treatments/Exercises (OP) - 08/04/19 0001      ADLs   ADL Education Given  Yes      Manual Therapy   Manual Therapy  Edema management    Manual therapy comments  comparative limb volumetrics- LLE    Edema Management  skin care to LLE throughout MLD using low ph castor oil to increase skin hydration and mobility to decrease infection risk and facilitate improved lymphatic function    Manual Lymphatic Drainage (MLD)  --    Compression Bandaging  multilayer short stretch compression wraps reapplied. No telfa pads avaialble in Pt's bag to allow [place] to change those.              OT Education - 08/04/19 1638    Education Details  Pt slep through much of visit. Educated daughter re difference between systemic swelling due to fluid retention  and lymphatic swelling. Encouraged daughter to encourage her mother toreport theese symptoms to her doctor.    Person(s) Educated  Patient;Spouse    Methods  Explanation;Demonstration;Handout    Comprehension  Verbalized understanding;Returned demonstration;Need further instruction          OT Long Term Goals - 08/04/19 1600      OT LONG TERM GOAL #1   Title  Pt will be able to verbalize signs and symptoms of cellulitis infection and identify 4 common lymphedema precautions using printed resource for reference (modified independence) to limit LE progression over time .    Baseline  Max A    Time  4    Period  Days    Status  Partially Met      OT LONG TERM GOAL #2   Title  Pt will apply BLE, knee length, multi-layer, short stretch compression wraps daily using correct gradient techniques with max CG assist    to achieve optimal limb volume reduction, to return affected limb , as closely as possible, to premorbid size and shape, to limit infection risk, and to improve safe functional ambulation and mobility.    Baseline  dependent    Time  4    Period  Days    Status  Achieved      OT LONG TERM GOAL #3   Title  Pt to achieve at least 10% BLE limb volume reductions during Intensive Phase CDT to improve independence and safety with functional performance of basic and instrumental ADLs, functional  mobility and ambulation, and  to limit  infection risk and LE progression.    Baseline  Dependent    Time  12    Period  Weeks    Status  Not Met   LLE below the knee is increased by 33% since starting CDT     OT LONG TERM GOAL #4   Title  Pt will achieve and sustain at least 85% compliance with daily LE self-care home program (skin care, lymphatic pumping therex, compression and simple self-MLD) ) with Max CG assistance during Intensive Phase CDT  to limit  limb swelling, reduce infection risk,  limit associated pain , and limit LE progression.    Baseline  dependent    Time  12     Period  Weeks    Status  Partially Met      OT LONG TERM GOAL #5   Title  Pt will be able to don and doff appropriate compression garments and/ or devices using correct techniques and assistive devices with Max CG A by issue date  for optimal LE management to limit progression over time.    Baseline  12    Status  On-going      OT LONG TERM GOAL #6   Title  During self-management phase of CDT Pt will retain limb volume reductions achieved during Intensive Phase CDT with Max CG A with no more than 3% volume increase to limit LE progression and further functional decline.    Baseline  dependent    Time  6    Period  Months    Status  On-going            Plan - 08/04/19 1642    Clinical Impression Statement  Pt demonstrates progress towards most OT goals for lymphedema management, but limb volume reduction, despite aggressive compression wrapping of LLE below the knee continues to increase in volume and present with intermittent lymphorrhea. Pt has has one episode of cellulitis in the LLE since commencing OT, and she has 2 anterior leag wounds that persist. Spouse is treating with nonstick pads and antibacterial ointment.  LLE comparative limb volumetrics completed today. LLE limb volume is increased 6.56% since last measures on 07/26/19, when it had increased by 25.15% since commencing CDT. Overall LLE limb volume increase since   commencing OT for CDT measures 33%, which is a dramatic increase , especially considering the agressive compression wrapping Pt is performing in clinic and between sessions. Suspect ongoing increases in leg volume are secondary to syestemic etiologies , and lymphatic system remains overwhelmed by overload to the degree that manual therapy and compression wraps are not controlling or containing it. OT recommends Pt see appropriate physician to explore compliance with diuretics and possible new cardiac issues.       Patient will benefit from skilled therapeutic  intervention in order to improve the following deficits and impairments:           Visit Diagnosis: Lymphedema, not elsewhere classified    Problem List Patient Active Problem List   Diagnosis Date Noted  . Long term (current) use of anticoagulants 06/11/2019  . TIA (transient ischemic attack) 06/04/2019  . Transient neurologic deficit 06/03/2019  . Anemia 06/03/2019  . Chronic diastolic CHF (congestive heart failure) ([place]) 06/03/2019  . CAD (coronary artery disease) 06/03/2019  . Essential hypertension 06/03/2019  . Sleep apnea 09/25/2018  . At risk for obstructive sleep apnea 08/14/2018  . Obstructive hypertrophic cardiomyopathy ([place]) 07/10/2018  . Dysfunctional uterine bleeding 04/22/2018  . Edema of both legs 02/20/2017  . 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 Morbid obesity ([place]) 02/20/2017  . Aortic stenosis 03/23/2013  . Hypertensive heart disease with heart failure ([place]) 03/23/2013    Andrey Spearman, MS, OTR/L, [facility] 08/04/19 4:53 PM  [place] MAIN [facility] SERVICES [address] [place], [place], [address] Phone: [phone]   Fax:  [phone]  Name: [name] MRN: [id number] Date of Birth: February 09, 1956

## 2019-08-09 ENCOUNTER — Ambulatory Visit: Payer: BC Managed Care – PPO | Admitting: Occupational Therapy

## 2019-08-10 ENCOUNTER — Ambulatory Visit (INDEPENDENT_AMBULATORY_CARE_PROVIDER_SITE_OTHER): Payer: BC Managed Care – PPO | Admitting: *Deleted

## 2019-08-10 ENCOUNTER — Other Ambulatory Visit: Payer: Self-pay

## 2019-08-10 DIAGNOSIS — Z954 Presence of other heart-valve replacement: Secondary | ICD-10-CM

## 2019-08-10 DIAGNOSIS — Z7901 Long term (current) use of anticoagulants: Secondary | ICD-10-CM

## 2019-08-10 DIAGNOSIS — G459 Transient cerebral ischemic attack, unspecified: Secondary | ICD-10-CM

## 2019-08-10 LAB — POCT INR: INR: 1.2 — AB (ref 2.0–3.0)

## 2019-08-10 NOTE — Patient Instructions (Signed)
Take 2 tablets tonight and tomorrow night then resume 1/2 tablet daily except 1 tablet on Sundays, Tuesdays and Thursdays.  Repeat INR in 1 week Take dose exactly as instructed.  Do not miss doses or self dose as this can be very dangerous.

## 2019-08-11 ENCOUNTER — Ambulatory Visit: Payer: BC Managed Care – PPO | Attending: Family Medicine | Admitting: Occupational Therapy

## 2019-08-11 DIAGNOSIS — I890 Lymphedema, not elsewhere classified: Secondary | ICD-10-CM | POA: Diagnosis present

## 2019-08-12 NOTE — Therapy (Signed)
[place] MAIN [facility] SERVICES [address] [place], [place], [address] Phone: [phone]   Fax:  [phone]  Occupational Therapy Treatment  Patient Details  Name: [name] MRN: [id number] Date of Birth: 09-09-1956 Referring Provider (OT): Jacqlyn Larsen , MD   Encounter Date: 08/11/2019  OT End of Session - 08/11/19 0830    Visit Number  11    Number of Visits  36    Date for OT Re-Evaluation  09/30/19    OT Start Time  0310    OT Stop Time  0415    OT Time Calculation (min)  65 min    Activity Tolerance  Patient tolerated treatment well;No increased pain;Other (comment)   participation and skilled learning for LE self care limited by sleepiness   Behavior During Therapy  [facility] for tasks assessed/performed       Past Medical History:  Diagnosis Date  . Aortic stenosis 03/23/2013   Overview:  02/18/13 TTE EF >55%. Critical AS with mean Ao valve gradient of 82 mm Hg. No AI. No MR, PR, mild TR. Estimated RVSP 30 mm Hg.  . Bicuspid aortic valve   . Edema of both legs 02/20/2017  . 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 Heart failure ([place])   . HTN (hypertension) 03/23/2013  . 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 Morbid obesity ([place]) 02/20/2017  . Sleep apnea     Past Surgical History:  Procedure Laterality Date  . BUBBLE STUDY  06/07/2019   Procedure: BUBBLE STUDY;  Surgeon: Buford Dresser, MD;  Location: [place];  Service: Cardiovascular;;  . CHOLECYSTECTOMY  1983  . RIGHT/LEFT HEART CATH AND CORONARY ANGIOGRAPHY N/A 04/01/2019   Procedure: RIGHT/LEFT HEART CATH AND CORONARY ANGIOGRAPHY;  Surgeon: Lorretta Harp, MD;  Location: [facility] CV LAB;  Service: Cardiovascular;  Laterality: N/A;  . TEE WITHOUT CARDIOVERSION N/A 07/01/2018   Procedure: TRANSESOPHAGEAL ECHOCARDIOGRAM (TEE);  Surgeon: Buford Dresser, MD;  Location: [facility] ENDOSCOPY;  Service: Cardiovascular;  Laterality: N/A;  . TEE WITHOUT  CARDIOVERSION N/A 06/07/2019   Procedure: TRANSESOPHAGEAL ECHOCARDIOGRAM (TEE);  Surgeon: Buford Dresser, MD;  Location: [facility] ENDOSCOPY;  Service: Cardiovascular;  Laterality: N/A;  . TISSUE AORTIC VALVE REPLACEMENT      There were no vitals filed for this visit.  Subjective Assessment - 08/12/19 0037    Subjective   [name] presents to OT for Rx visit 11/36 to address BLE lymphedema. Pt is accompanied by her husband, [name].Compression wraps are in place bilaterally as [name] reports both legs have been "weeping" and more swollen with increased skin irritation. Pt and spouse report vist to PCP during OT Rx interval. They report Dr. Lin Landsman is reviewing blood work and no meds changed were made. OT left VM for referring MD with status update re limit progress to date with significant increases in BLE swelling below the knees bilaterally w/ worsening skin condition.    Patient is accompanied by:  Family member    Pertinent History  Complex  H&P with several contributing co-morbidities, including CHF, CAD, HTN, Aortic stenosis s/p bioprosthtic valve replacement, severe OSA (bipap), chronic kidney insufficiency, super obesity    Limitations  difficulty walking, impaired functional mobility and transfers, generalized deconditioning, impaired activity tolerance, decreased endurance, AROM limitations at hips, knees and ankles 2/2 body habitus/ girth at joints,    Currently in Pain?  Other (Comment)   Pt asleep through session. Pain not reported  OT Treatments/Exercises (OP) - 08/12/19 0001      ADLs   ADL Education Given  Yes      Manual Therapy   Manual Therapy  Edema management;Manual Lymphatic Drainage (MLD);Compression Bandaging;Other (comment)   skin care to LLE   Edema Management  skin care to LLE throughout MLD using low ph castor oil to increase skin hydration and mobility to decrease infection risk and facilitate improved lymphatic function    Manual Lymphatic  Drainage (MLD)  MLD to LLE utilining short neck sequence, deep abdominnal pathways and functional inguinal LN in  long sitting w open hip angle.     Compression Bandaging  multilayer short stretch compression wraps reapplied. No telfa pads avaialble in Pt's bag to allow [place] to change those.     Other Manual Therapy  did not remove RLE compression wraps. Issued additional set of wraps             OT Education - 08/12/19 0825    Education Details  Pt and family edu re differences between systemic edema and lymphedema. Reviewed influence of systemic edema and lymphatic overload. Reviewed importance of impecable skin care to limit infection risk and progression.    Person(s) Educated  Patient;Spouse    Methods  Explanation;Demonstration;Handout    Comprehension  Verbalized understanding;Returned demonstration;Need further instruction          OT Long Term Goals - 08/04/19 1600      OT LONG TERM GOAL #1   Title  Pt will be able to verbalize signs and symptoms of cellulitis infection and identify 4 common lymphedema precautions using printed resource for reference (modified independence) to limit LE progression over time .    Baseline  Max A    Time  4    Period  Days    Status  Partially Met      OT LONG TERM GOAL #2   Title  Pt will apply BLE, knee length, multi-layer, short stretch compression wraps daily using correct gradient techniques with max CG assist    to achieve optimal limb volume reduction, to return affected limb , as closely as possible, to premorbid size and shape, to limit infection risk, and to improve safe functional ambulation and mobility.    Baseline  dependent    Time  4    Period  Days    Status  Achieved      OT LONG TERM GOAL #3   Title  Pt to achieve at least 10% BLE limb volume reductions during Intensive Phase CDT to improve independence and safety with functional performance of basic and instrumental ADLs, functional  mobility and ambulation, and  to  limit  infection risk and LE progression.    Baseline  Dependent    Time  12    Period  Weeks    Status  Not Met   LLE below the knee is increased by 33% since starting CDT     OT LONG TERM GOAL #4   Title  Pt will achieve and sustain at least 85% compliance with daily LE self-care home program (skin care, lymphatic pumping therex, compression and simple self-MLD) ) with Max CG assistance during Intensive Phase CDT  to limit  limb swelling, reduce infection risk,  limit associated pain , and limit LE progression.    Baseline  dependent    Time  12    Period  Weeks    Status  Partially Met      OT LONG  TERM GOAL #5   Title  Pt will be able to don and doff appropriate compression garments and/ or devices using correct techniques and assistive devices with Max CG A by issue date  for optimal LE management to limit progression over time.    Baseline  12    Status  On-going      OT LONG TERM GOAL #6   Title  During self-management phase of CDT Pt will retain limb volume reductions achieved during Intensive Phase CDT with Max CG A with no more than 3% volume increase to limit LE progression and further functional decline.    Baseline  dependent    Time  6    Period  Months    Status  On-going            Plan - 08/12/19 0831    Clinical Impression Statement  Pt presenting with ongoing increases in BLE swelling below the knees, lymphorrhea bilaterally and worsening skin condition. Spouse is applying zinc ointment to two open areas on L shin and L politeal fossa, which was not irritated last visit. Pt c/o increased sensation of abdominal swelling. Provided Pt and family edu throught session while providing manual therapy and compression wrap application. Pt slept through session. [place] discussion included concerns re systemic etiologies and their impact on worsening condition of legs. OT offered increased frequency to 3 x weekly. Spouse will consider. Cont as per POC 2 x weekly for CDT, one  leg at a time. Will continue to monitor skin carefully and will request referral to wound care clinic PRN.       Patient will benefit from skilled therapeutic intervention in order to improve the following deficits and impairments:           Visit Diagnosis: Lymphedema, not elsewhere classified    Problem List Patient Active Problem List   Diagnosis Date Noted  . Long term (current) use of anticoagulants 06/11/2019  . TIA (transient ischemic attack) 06/04/2019  . Transient neurologic deficit 06/03/2019  . Anemia 06/03/2019  . Chronic diastolic CHF (congestive heart failure) ([place]) 06/03/2019  . CAD (coronary artery disease) 06/03/2019  . Essential hypertension 06/03/2019  . Sleep apnea 09/25/2018  . At risk for obstructive sleep apnea 08/14/2018  . Obstructive hypertrophic cardiomyopathy ([place]) 07/10/2018  . Dysfunctional uterine bleeding 04/22/2018  . Edema of both legs 02/20/2017  . 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 Morbid obesity ([place]) 02/20/2017  . Aortic stenosis 03/23/2013  . Hypertensive heart disease with heart failure ([place]) 03/23/2013    Andrey Spearman, MS, OTR/L, [facility] 08/12/19 8:37 AM  [place] MAIN [facility] SERVICES [address] [place], [place], [address] Phone: [phone]   Fax:  [phone]  Name: [name] MRN: [id number] Date of Birth: 01/17/1956

## 2019-08-13 ENCOUNTER — Other Ambulatory Visit: Payer: Self-pay

## 2019-08-13 ENCOUNTER — Ambulatory Visit: Payer: BC Managed Care – PPO | Admitting: Occupational Therapy

## 2019-08-13 DIAGNOSIS — I890 Lymphedema, not elsewhere classified: Secondary | ICD-10-CM | POA: Diagnosis not present

## 2019-08-13 NOTE — Therapy (Signed)
[place] MAIN [facility] SERVICES [address] [place], [place], [address] Phone: [phone]   Fax:  [phone]  Occupational Therapy Treatment  Patient Details  Name: [name] MRN: [id number] Date of Birth: [birth year] Referring Provider (OT): Jacqlyn Larsen , MD   Encounter Date: 08/13/2019  OT End of Session - 08/13/19 1211    Visit Number  12    OT Start Time  1010    OT Stop Time  1110    OT Time Calculation (min)  60 min    Activity Tolerance  Patient tolerated treatment well;No increased pain    Behavior During Therapy  --   sleepy      Past Medical History:  Diagnosis Date  . Aortic stenosis 03/23/2013   Overview:  02/18/13 TTE EF >55%. Critical AS with mean Ao valve gradient of 82 mm Hg. No AI. No MR, PR, mild TR. Estimated RVSP 30 mm Hg.  . Bicuspid aortic valve   . Edema of both legs 02/20/2017  . 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 Heart failure ([place])   . HTN (hypertension) 03/23/2013  . 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 Morbid obesity ([place]) 02/20/2017  . Sleep apnea     Past Surgical History:  Procedure Laterality Date  . BUBBLE STUDY  06/07/2019   Procedure: BUBBLE STUDY;  Surgeon: Buford Dresser, MD;  Location: [place];  Service: Cardiovascular;;  . CHOLECYSTECTOMY  1983  . RIGHT/LEFT HEART CATH AND CORONARY ANGIOGRAPHY N/A 04/01/2019   Procedure: RIGHT/LEFT HEART CATH AND CORONARY ANGIOGRAPHY;  Surgeon: Lorretta Harp, MD;  Location: [place] CV LAB;  Service: Cardiovascular;  Laterality: N/A;  . TEE WITHOUT CARDIOVERSION N/A 07/01/2018   Procedure: TRANSESOPHAGEAL ECHOCARDIOGRAM (TEE);  Surgeon: Buford Dresser, MD;  Location: [facility] ENDOSCOPY;  Service: Cardiovascular;  Laterality: N/A;  . TEE WITHOUT CARDIOVERSION N/A 06/07/2019   Procedure: TRANSESOPHAGEAL ECHOCARDIOGRAM (TEE);  Surgeon: Buford Dresser, MD;  Location: [facility] ENDOSCOPY;  Service: Cardiovascular;  Laterality:  N/A;  . TISSUE AORTIC VALVE REPLACEMENT      There were no vitals filed for this visit.  Subjective Assessment - 08/13/19 1208    Subjective   [name] presents to OT for Rx visit 12/36 to address BLE lymphedema. Pt is unaccompanied. She does not bring clean wraps to session. She presents in LLE wraps dampened with lymphorrhea. RLE is unwrapped and lymphorrhea and blisters on the anterior leg below the knee are observed.    Patient is accompanied by:  Family member    Pertinent History  Complex  H&P with several contributing co-morbidities, including CHF, CAD, HTN, Aortic stenosis s/p bioprosthtic valve replacement, severe OSA (bipap), chronic kidney insufficiency, super obesity    Limitations  difficulty walking, impaired functional mobility and transfers, generalized deconditioning, impaired activity tolerance, decreased endurance, AROM limitations at hips, knees and ankles 2/2 body habitus/ girth at joints,                   OT Treatments/Exercises (OP) - 08/13/19 0001      ADLs   ADL Education Given  Yes      Manual Therapy   Manual Therapy  Edema management;Manual Lymphatic Drainage (MLD);Compression Bandaging    Edema Management  skin care to LLE throughout MLD using low ph castor oil to increase skin hydration and mobility to decrease infection risk and facilitate improved lymphatic function    Manual Lymphatic Drainage (MLD)  MLD to LLE utilining short neck sequence,  deep abdominnal pathways and functional inguinal LN in  long sitting w open hip angle.     Compression Bandaging  multilayer short stretch compression wraps reapplied. No telfa pads avaialble in Pt's bag to allow [place] to change those.     Other Manual Therapy  replaced wet non stick pads on anterior L leg wounds with dry telfa held in place with stretch net under wraps. replaced soiled Rosidal foam             OT Education - 08/13/19 1211    Education Details  Continued skilled Pt/caregiver education   And LE ADL training throughout visit for lymphedema self care/ home program, including compression wrapping, compression garment and device wear/care, lymphatic pumping ther ex, simple self-MLD, and skin care. Discussed progress towards goals.    Person(s) Educated  Patient;Spouse    Methods  Explanation;Demonstration;Handout    Comprehension  Verbalized understanding;Returned demonstration;Need further instruction          OT Long Term Goals - 08/04/19 1600      OT LONG TERM GOAL #1   Title  Pt will be able to verbalize signs and symptoms of cellulitis infection and identify 4 common lymphedema precautions using printed resource for reference (modified independence) to limit LE progression over time .    Baseline  Max A    Time  4    Period  Days    Status  Partially Met      OT LONG TERM GOAL #2   Title  Pt will apply BLE, knee length, multi-layer, short stretch compression wraps daily using correct gradient techniques with max CG assist    to achieve optimal limb volume reduction, to return affected limb , as closely as possible, to premorbid size and shape, to limit infection risk, and to improve safe functional ambulation and mobility.    Baseline  dependent    Time  4    Period  Days    Status  Achieved      OT LONG TERM GOAL #3   Title  Pt to achieve at least 10% BLE limb volume reductions during Intensive Phase CDT to improve independence and safety with functional performance of basic and instrumental ADLs, functional  mobility and ambulation, and  to limit  infection risk and LE progression.    Baseline  Dependent    Time  12    Period  Weeks    Status  Not Met   LLE below the knee is increased by 33% since starting CDT     OT LONG TERM GOAL #4   Title  Pt will achieve and sustain at least 85% compliance with daily LE self-care home program (skin care, lymphatic pumping therex, compression and simple self-MLD) ) with Max CG assistance during Intensive Phase CDT  to limit   limb swelling, reduce infection risk,  limit associated pain , and limit LE progression.    Baseline  dependent    Time  12    Period  Weeks    Status  Partially Met      OT LONG TERM GOAL #5   Title  Pt will be able to don and doff appropriate compression garments and/ or devices using correct techniques and assistive devices with Max CG A by issue date  for optimal LE management to limit progression over time.    Baseline  12    Status  On-going      OT LONG TERM GOAL #6   Title  During self-management phase of CDT Pt will retain limb volume reductions achieved during Intensive Phase CDT with Max CG A with no more than 3% volume increase to limit LE progression and further functional decline.    Baseline  dependent    Time  6    Period  Months    Status  On-going            Plan - 08/13/19 1212    Clinical Impression Statement  Pt tolerated MLD, skin care and compression wraps  replaced to LLE. Reused existing wraps as Pt does not bring clean ones to clinic today. Skin  condition on LLE appears to be improving slightly. Skin condition on RLE appears to be worsening as lymphatic blisters, open areas 2/2 skin breakdown and lymphorrhea are observed, Enthusiastically encouraged Pt to address skin condition diligently and bring  clean wraps and telfa to each session       Patient will benefit from skilled therapeutic intervention in order to improve the following deficits and impairments:           Visit Diagnosis: Lymphedema, not elsewhere classified    Problem List Patient Active Problem List   Diagnosis Date Noted  . Long term (current) use of anticoagulants 06/11/2019  . TIA (transient ischemic attack) 06/04/2019  . Transient neurologic deficit 06/03/2019  . Anemia 06/03/2019  . Chronic diastolic CHF (congestive heart failure) ([place]) 06/03/2019  . CAD (coronary artery disease) 06/03/2019  . Essential hypertension 06/03/2019  . Sleep apnea 09/25/2018  . At risk for  obstructive sleep apnea 08/14/2018  . Obstructive hypertrophic cardiomyopathy ([place]) 07/10/2018  . Dysfunctional uterine bleeding 04/22/2018  . Edema of both legs 02/20/2017  . 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 Morbid obesity ([place]) 02/20/2017  . Aortic stenosis 03/23/2013  . Hypertensive heart disease with heart failure ([place]) 03/23/2013    Andrey Spearman, MS, OTR/L, [facility] 08/13/19 12:16 PM   [place] MAIN [facility] SERVICES [address] [place], [place], [address] Phone: [phone]   Fax:  [phone]  Name: [name] MRN: [id number] Date of Birth: [birth year]

## 2019-08-16 ENCOUNTER — Ambulatory Visit: Payer: BC Managed Care – PPO | Admitting: Occupational Therapy

## 2019-08-17 ENCOUNTER — Other Ambulatory Visit: Payer: Self-pay

## 2019-08-17 ENCOUNTER — Ambulatory Visit (INDEPENDENT_AMBULATORY_CARE_PROVIDER_SITE_OTHER): Payer: BC Managed Care – PPO | Admitting: Pharmacist

## 2019-08-17 DIAGNOSIS — Z30430 Encounter for insertion of intrauterine contraceptive device: Secondary | ICD-10-CM | POA: Insufficient documentation

## 2019-08-17 DIAGNOSIS — Z954 Presence of other heart-valve replacement: Secondary | ICD-10-CM | POA: Diagnosis not present

## 2019-08-17 DIAGNOSIS — Z7901 Long term (current) use of anticoagulants: Secondary | ICD-10-CM

## 2019-08-17 DIAGNOSIS — G459 Transient cerebral ischemic attack, unspecified: Secondary | ICD-10-CM | POA: Diagnosis not present

## 2019-08-17 HISTORY — DX: Encounter for insertion of intrauterine contraceptive device: Z30.430

## 2019-08-17 LAB — POCT INR: INR: 3.6 — AB (ref 2.0–3.0)

## 2019-08-17 NOTE — Patient Instructions (Signed)
Description   Skip warfarin today, then start taking 1/2 tablet daily except 1 tablet on Sundays and Thursdays (next Tuesday will now be a lower dose).  Repeat INR in 1 week Take dose exactly as instructed.  Do not miss doses or self dose as this can be very dangerous.

## 2019-08-18 ENCOUNTER — Ambulatory Visit: Payer: BC Managed Care – PPO | Admitting: Occupational Therapy

## 2019-08-18 DIAGNOSIS — I890 Lymphedema, not elsewhere classified: Secondary | ICD-10-CM | POA: Diagnosis not present

## 2019-08-18 NOTE — Therapy (Signed)
[place] MAIN [facility] SERVICES [address] [place], [place], [address] Phone: [phone]   Fax:  [phone]  Occupational Therapy Treatment  Patient Details  Name: [name] MRN: [id number] Date of Birth: 1956/06/04 Referring Provider (OT): Jacqlyn Larsen , MD   Encounter Date: 08/18/2019  OT End of Session - 08/18/19 1630    Visit Number  13    OT Start Time  0300    OT Stop Time  0400    OT Time Calculation (min)  60 min    Activity Tolerance  Patient tolerated treatment well;No increased pain    Behavior During Therapy  --   sleepy      Past Medical History:  Diagnosis Date  . Aortic stenosis 03/23/2013   Overview:  02/18/13 TTE EF >55%. Critical AS with mean Ao valve gradient of 82 mm Hg. No AI. No MR, PR, mild TR. Estimated RVSP 30 mm Hg.  . Bicuspid aortic valve   . Edema of both legs 02/20/2017  . 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 Heart failure ([place])   . HTN (hypertension) 03/23/2013  . 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 Morbid obesity ([place]) 02/20/2017  . Sleep apnea     Past Surgical History:  Procedure Laterality Date  . BUBBLE STUDY  06/07/2019   Procedure: BUBBLE STUDY;  Surgeon: Buford Dresser, MD;  Location: [place];  Service: Cardiovascular;;  . CHOLECYSTECTOMY  1983  . RIGHT/LEFT HEART CATH AND CORONARY ANGIOGRAPHY N/A 04/01/2019   Procedure: RIGHT/LEFT HEART CATH AND CORONARY ANGIOGRAPHY;  Surgeon: Lorretta Harp, MD;  Location: [place] CV LAB;  Service: Cardiovascular;  Laterality: N/A;  . TEE WITHOUT CARDIOVERSION N/A 07/01/2018   Procedure: TRANSESOPHAGEAL ECHOCARDIOGRAM (TEE);  Surgeon: Buford Dresser, MD;  Location: [facility] ENDOSCOPY;  Service: Cardiovascular;  Laterality: N/A;  . TEE WITHOUT CARDIOVERSION N/A 06/07/2019   Procedure: TRANSESOPHAGEAL ECHOCARDIOGRAM (TEE);  Surgeon: Buford Dresser, MD;  Location: [facility] ENDOSCOPY;  Service: Cardiovascular;  Laterality:  N/A;  . TISSUE AORTIC VALVE REPLACEMENT      There were no vitals filed for this visit.  Subjective Assessment - 08/18/19 1617    Subjective   Mrs Bonnette presents to OT for Rx visit 13/36 to address BLE lymphedema. Pt is accompanied by her husband, [name], today. Pt presents with RLE wrapped from toes to tibial tuberosity in short stretch bandages over Rosidal foam using circumferential gradient techniques .Superficial skin breakdown due to chronic lymphorrhea is observed circumferentially at distal R leg. Spouse reports breakdown on covered LLE looks "about the same. Mr Lozon reports that he continues to apply Zinc ointment to superficial wounds then covers then with non-stick pads before applying compression wraps.    Patient is accompanied by:  Family member    Pertinent History  Complex  H&P with several contributing co-morbidities, including CHF, CAD, HTN, Aortic stenosis s/p bioprosthtic valve replacement, severe OSA (bipap), chronic kidney insufficiency, super obesity    Limitations  difficulty walking, impaired functional mobility and transfers, generalized deconditioning, impaired activity tolerance, decreased endurance, AROM limitations at hips, knees and ankles 2/2 body habitus/ girth at joints,                   OT Treatments/Exercises (OP) - 08/18/19 0001      ADLs   ADL Education Given  Yes      Manual Therapy   Manual Therapy  Edema management;Manual Lymphatic Drainage (MLD);Compression Bandaging    Edema Management  skin care to LLE throughout MLD using low ph castor oil concentrating on dry cracked anterior ankle and lateral area of dry flaking skin    Manual Lymphatic Drainage (MLD)  MLD to LLE utilining short neck sequence, deep abdominnal pathways and functional inguinal LN in  long sitting w open hip angle.     Compression Bandaging  multilayer short stretch compression wraps applied over non-stick pads covering zink ointment applied by spouse    .    Other Manual  Therapy  --             OT Education - 08/18/19 1629    Education Details  Discussed option of coordination care with [facility] wuld clinic  so Pt could see them for wound , then attend OT immediately afterwards for MLD and gradient compression wrapping.    Person(s) Educated  Patient;Spouse    Methods  Explanation;Demonstration;Handout    Comprehension  Verbalized understanding;Returned demonstration;Need further instruction          OT Long Term Goals - 08/04/19 1600      OT LONG TERM GOAL #1   Title  Pt will be able to verbalize signs and symptoms of cellulitis infection and identify 4 common lymphedema precautions using printed resource for reference (modified independence) to limit LE progression over time .    Baseline  Max A    Time  4    Period  Days    Status  Partially Met      OT LONG TERM GOAL #2   Title  Pt will apply BLE, knee length, multi-layer, short stretch compression wraps daily using correct gradient techniques with max CG assist    to achieve optimal limb volume reduction, to return affected limb , as closely as possible, to premorbid size and shape, to limit infection risk, and to improve safe functional ambulation and mobility.    Baseline  dependent    Time  4    Period  Days    Status  Achieved      OT LONG TERM GOAL #3   Title  Pt to achieve at least 10% BLE limb volume reductions during Intensive Phase CDT to improve independence and safety with functional performance of basic and instrumental ADLs, functional  mobility and ambulation, and  to limit  infection risk and LE progression.    Baseline  Dependent    Time  12    Period  Weeks    Status  Not Met   LLE below the knee is increased by 33% since starting CDT     OT LONG TERM GOAL #4   Title  Pt will achieve and sustain at least 85% compliance with daily LE self-care home program (skin care, lymphatic pumping therex, compression and simple self-MLD) ) with Max CG assistance during Intensive  Phase CDT  to limit  limb swelling, reduce infection risk,  limit associated pain , and limit LE progression.    Baseline  dependent    Time  12    Period  Weeks    Status  Partially Met      OT LONG TERM GOAL #5   Title  Pt will be able to don and doff appropriate compression garments and/ or devices using correct techniques and assistive devices with Max CG A by issue date  for optimal LE management to limit progression over time.    Baseline  12    Status  On-going      OT LONG TERM  GOAL #6   Title  During self-management phase of CDT Pt will retain limb volume reductions achieved during Intensive Phase CDT with Max CG A with no more than 3% volume increase to limit LE progression and further functional decline.    Baseline  dependent    Time  6    Period  Months    Status  On-going            Plan - 08/18/19 1631    Clinical Impression Statement  Persistent superficial BLE skin breakdown due to chronic  distal lymphorrhea is worsening and will benefit from  simultaneous wound care concurrent with lymphedema therapy to limit infection risk, facilitate wound healing, contain swelling and limit lymphorrhea. Best scenario is for Pt to receive Rx at [facility] wound clinic on consistent days and times, then follow with  OT immediately on same days afterwards for MLD and gradient compression wrapping. Pt and spouse are adamant that they do not want to resume wound care at clinic  she attended for ~ 1 yr near home in [place], [place]. It is out hope that providers are able to coordinate care for optimal outcome in this case.       Patient will benefit from skilled therapeutic intervention in order to improve the following deficits and impairments:           Visit Diagnosis: Lymphedema, not elsewhere classified    Problem List Patient Active Problem List   Diagnosis Date Noted  . Long term (current) use of anticoagulants 06/11/2019  . TIA (transient ischemic attack) 06/04/2019  .  Transient neurologic deficit 06/03/2019  . Anemia 06/03/2019  . Chronic diastolic CHF (congestive heart failure) ([place]) 06/03/2019  . CAD (coronary artery disease) 06/03/2019  . Essential hypertension 06/03/2019  . Sleep apnea 09/25/2018  . At risk for obstructive sleep apnea 08/14/2018  . Obstructive hypertrophic cardiomyopathy ([place]) 07/10/2018  . Dysfunctional uterine bleeding 04/22/2018  . Edema of both legs 02/20/2017  . 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 Morbid obesity ([place]) 02/20/2017  . Aortic stenosis 03/23/2013  . Hypertensive heart disease with heart failure ([place]) 03/23/2013   Andrey Spearman, MS, OTR/L, [facility] 08/18/19 4:40 PM   [place] MAIN [facility] SERVICES [address] [place], [place], [address] Phone: [phone]   Fax:  [phone]  Name: [name] MRN: [id number] Date of Birth: [birth year]

## 2019-08-19 ENCOUNTER — Telehealth: Payer: Self-pay | Admitting: Cardiology

## 2019-08-19 NOTE — Telephone Encounter (Signed)
Patient informed of normal holter monitor results and verbalized understanding. She expressed concern regarding bilateral lower extremity edema that has not improved. Per Dr. Bettina Gavia, patient has been scheduled for a follow up appointment on Thursday, 09/15/2018, at 3:15 pm in the [facility] office. Patient will arrive 15 minutes early. Patient is agreeable. No further questions.

## 2019-08-19 NOTE — Telephone Encounter (Signed)
Please call regarding heart monitor results

## 2019-08-19 NOTE — Telephone Encounter (Signed)
Left message to return call 

## 2019-08-20 ENCOUNTER — Ambulatory Visit: Payer: BC Managed Care – PPO | Admitting: Occupational Therapy

## 2019-08-20 ENCOUNTER — Ambulatory Visit: Payer: BC Managed Care – PPO | Admitting: Physician Assistant

## 2019-08-23 ENCOUNTER — Other Ambulatory Visit: Payer: Self-pay

## 2019-08-23 ENCOUNTER — Ambulatory Visit: Payer: BC Managed Care – PPO | Admitting: Occupational Therapy

## 2019-08-23 DIAGNOSIS — I890 Lymphedema, not elsewhere classified: Secondary | ICD-10-CM | POA: Diagnosis not present

## 2019-08-24 ENCOUNTER — Ambulatory Visit: Payer: BC Managed Care – PPO | Admitting: Pulmonary Disease

## 2019-08-24 ENCOUNTER — Other Ambulatory Visit: Payer: Self-pay

## 2019-08-24 ENCOUNTER — Ambulatory Visit (INDEPENDENT_AMBULATORY_CARE_PROVIDER_SITE_OTHER): Payer: BC Managed Care – PPO | Admitting: *Deleted

## 2019-08-24 DIAGNOSIS — G459 Transient cerebral ischemic attack, unspecified: Secondary | ICD-10-CM | POA: Diagnosis not present

## 2019-08-24 DIAGNOSIS — Z7901 Long term (current) use of anticoagulants: Secondary | ICD-10-CM

## 2019-08-24 DIAGNOSIS — Z5181 Encounter for therapeutic drug level monitoring: Secondary | ICD-10-CM

## 2019-08-24 LAB — POCT INR: INR: 2.7 (ref 2.0–3.0)

## 2019-08-24 NOTE — Therapy (Signed)
[place] MAIN [facility] SERVICES [address] [place], [place], [address] Phone: [phone]   Fax:  [phone]  Occupational Therapy Treatment  Patient Details  Name: [name] MRN: [id number] Date of Birth: 04/12/1956 Referring Provider (OT): Jacqlyn Larsen , MD   Encounter Date: 08/23/2019  OT End of Session - 08/24/19 1519    Visit Number  14    Activity Tolerance  Patient tolerated treatment well;No increased pain    Behavior During Therapy  --   sleepy      Past Medical History:  Diagnosis Date  . Aortic stenosis 03/23/2013   Overview:  02/18/13 TTE EF >55%. Critical AS with mean Ao valve gradient of 82 mm Hg. No AI. No MR, PR, mild TR. Estimated RVSP 30 mm Hg.  . Bicuspid aortic valve   . Edema of both legs 02/20/2017  . 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 Heart failure ([place])   . HTN (hypertension) 03/23/2013  . 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 Morbid obesity ([place]) 02/20/2017  . Sleep apnea     Past Surgical History:  Procedure Laterality Date  . BUBBLE STUDY  06/07/2019   Procedure: BUBBLE STUDY;  Surgeon: Buford Dresser, MD;  Location: [place];  Service: Cardiovascular;;  . CHOLECYSTECTOMY  1983  . RIGHT/LEFT HEART CATH AND CORONARY ANGIOGRAPHY N/A 04/01/2019   Procedure: RIGHT/LEFT HEART CATH AND CORONARY ANGIOGRAPHY;  Surgeon: Lorretta Harp, MD;  Location: [place] CV LAB;  Service: Cardiovascular;  Laterality: N/A;  . TEE WITHOUT CARDIOVERSION N/A 07/01/2018   Procedure: TRANSESOPHAGEAL ECHOCARDIOGRAM (TEE);  Surgeon: Buford Dresser, MD;  Location: [facility] ENDOSCOPY;  Service: Cardiovascular;  Laterality: N/A;  . TEE WITHOUT CARDIOVERSION N/A 06/07/2019   Procedure: TRANSESOPHAGEAL ECHOCARDIOGRAM (TEE);  Surgeon: Buford Dresser, MD;  Location: [facility] ENDOSCOPY;  Service: Cardiovascular;  Laterality: N/A;  . TISSUE AORTIC VALVE REPLACEMENT      There were no vitals filed for  this visit.  Subjective Assessment - 08/23/19 1518    Subjective   [name] presents to OT for Rx visit 14/36 to address BLE lymphedema. Pt is unaccompanied, Pt presents with RLE wrapped from toes to tibial tuberosity in short stretch bandages as established. Pt reports she has an appointment at the wound cclinic this comiing Friday.  Pt slept through visit.    Patient is accompanied by:  Family member    Pertinent History  Complex  H&P with several contributing co-morbidities, including CHF, CAD, HTN, Aortic stenosis s/p bioprosthtic valve replacement, severe OSA (bipap), chronic kidney insufficiency, super obesity    Limitations  difficulty walking, impaired functional mobility and transfers, generalized deconditioning, impaired activity tolerance, decreased endurance, AROM limitations at hips, knees and ankles 2/2 body habitus/ girth at joints,    Currently in Pain?  Other (Comment)   not rated. Pt sleeping. Unable to assess                  OT Treatments/Exercises (OP) - 08/24/19 0001      ADLs   ADL Education Given  --   no. pt slept thru session     Manual Therapy   Manual Therapy  Edema management             OT Education - 08/23/19 1519    Education Details  No Pt edu this date. CG is absent and Pt slept throughout session.    Person(s) Educated  Patient;Spouse    Methods  Explanation;Demonstration;Handout  Comprehension  Verbalized understanding;Returned demonstration;Need further instruction          OT Long Term Goals - 08/04/19 1600      OT LONG TERM GOAL #1   Title  Pt will be able to verbalize signs and symptoms of cellulitis infection and identify 4 common lymphedema precautions using printed resource for reference (modified independence) to limit LE progression over time .    Baseline  Max A    Time  4    Period  Days    Status  Partially Met      OT LONG TERM GOAL #2   Title  Pt will apply BLE, knee length, multi-layer, short stretch  compression wraps daily using correct gradient techniques with max CG assist    to achieve optimal limb volume reduction, to return affected limb , as closely as possible, to premorbid size and shape, to limit infection risk, and to improve safe functional ambulation and mobility.    Baseline  dependent    Time  4    Period  Days    Status  Achieved      OT LONG TERM GOAL #3   Title  Pt to achieve at least 10% BLE limb volume reductions during Intensive Phase CDT to improve independence and safety with functional performance of basic and instrumental ADLs, functional  mobility and ambulation, and  to limit  infection risk and LE progression.    Baseline  Dependent    Time  12    Period  Weeks    Status  Not Met   LLE below the knee is increased by 33% since starting CDT     OT LONG TERM GOAL #4   Title  Pt will achieve and sustain at least 85% compliance with daily LE self-care home program (skin care, lymphatic pumping therex, compression and simple self-MLD) ) with Max CG assistance during Intensive Phase CDT  to limit  limb swelling, reduce infection risk,  limit associated pain , and limit LE progression.    Baseline  dependent    Time  12    Period  Weeks    Status  Partially Met      OT LONG TERM GOAL #5   Title  Pt will be able to don and doff appropriate compression garments and/ or devices using correct techniques and assistive devices with Max CG A by issue date  for optimal LE management to limit progression over time.    Baseline  12    Status  On-going      OT LONG TERM GOAL #6   Title  During self-management phase of CDT Pt will retain limb volume reductions achieved during Intensive Phase CDT with Max CG A with no more than 3% volume increase to limit LE progression and further functional decline.    Baseline  dependent    Time  6    Period  Months    Status  On-going            Plan - 08/24/19 1521    Clinical Impression Statement  No change in skin condition  since last visit. LLE wounds are unchanged, presenting with what appears to be superficial breakdown due to ongoing increases in interstitial pressure 2/2 unconotroleld systmic fluid retention and maceration due to chronic, mild lymphorrhea. Provided LLE MLD and reapplied dry short stretch, gradient compression wraps as established. OT looking forward to input from wound center and collaboration , if possible, to resolve wounds and  limit infection risk while treating lymphatic swelling. Pt has spokenn to cardia MD and PCP about her concerns re fluid retention and limb swelling.       Patient will benefit from skilled therapeutic intervention in order to improve the following deficits and impairments:           Visit Diagnosis: Lymphedema, not elsewhere classified    Problem List Patient Active Problem List   Diagnosis Date Noted  . Long term (current) use of anticoagulants 06/11/2019  . TIA (transient ischemic attack) 06/04/2019  . Transient neurologic deficit 06/03/2019  . Anemia 06/03/2019  . Chronic diastolic CHF (congestive heart failure) ([place]) 06/03/2019  . CAD (coronary artery disease) 06/03/2019  . Essential hypertension 06/03/2019  . Sleep apnea 09/25/2018  . At risk for obstructive sleep apnea 08/14/2018  . Obstructive hypertrophic cardiomyopathy ([place]) 07/10/2018  . Dysfunctional uterine bleeding 04/22/2018  . Edema of both legs 02/20/2017  . 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 Morbid obesity ([place]) 02/20/2017  . Aortic stenosis 03/23/2013  . Hypertensive heart disease with heart failure ([place]) 03/23/2013    Andrey Spearman, MS, OTR/L, [facility] 08/24/19 3:27 PM  [name] MAIN [facility] SERVICES [address] [place], [place], [address] Phone: [phone]   Fax:  [phone]  Name: [name] MRN: [id number] Date of Birth: [birth year]

## 2019-08-24 NOTE — Patient Instructions (Addendum)
Description   Continue taking 1/2 tablet daily except 1 tablet on Sundays and Thursdays. Recheck INR in 3 weeks to accommodate schedules.

## 2019-08-25 ENCOUNTER — Ambulatory Visit: Payer: BC Managed Care – PPO | Admitting: Occupational Therapy

## 2019-08-25 DIAGNOSIS — I890 Lymphedema, not elsewhere classified: Secondary | ICD-10-CM

## 2019-08-26 ENCOUNTER — Ambulatory Visit (INDEPENDENT_AMBULATORY_CARE_PROVIDER_SITE_OTHER): Payer: BC Managed Care – PPO | Admitting: Pulmonary Disease

## 2019-08-26 ENCOUNTER — Other Ambulatory Visit: Payer: Self-pay

## 2019-08-26 ENCOUNTER — Encounter: Payer: Self-pay | Admitting: Pulmonary Disease

## 2019-08-26 VITALS — BP 138/72 | HR 91 | Ht 62.0 in | Wt 290.0 lb

## 2019-08-26 DIAGNOSIS — R5382 Chronic fatigue, unspecified: Secondary | ICD-10-CM

## 2019-08-26 DIAGNOSIS — Z9989 Dependence on other enabling machines and devices: Secondary | ICD-10-CM | POA: Diagnosis not present

## 2019-08-26 DIAGNOSIS — G4733 Obstructive sleep apnea (adult) (pediatric): Secondary | ICD-10-CM | POA: Diagnosis not present

## 2019-08-26 NOTE — Patient Instructions (Signed)
Obstructive sleep apnea -Pain adequately treated at present  Try and use your BiPAP every night Set an alarm so that you remind yourself to put your BiPAP on prior to falling asleep  Increase activity during the day  Avoid daytime naps as best as you can  Continue using a sleep aid at night  Weight loss as you can tolerate  I will see you back in 6 weeks

## 2019-08-26 NOTE — Progress Notes (Signed)
Subjective:    Patient ID: [name], female    DOB: [birth year], 63 y.o.   MRN: [id number]  Patient with a background history of obstructive sleep apnea  She is supposed to be on BiPAP 27/22 She states she falls asleep a lot at times without putting a BiPAP on She has not been using it regularly  She was prescribed ramelteon the last time she was in the office It does help a little bit She gets about 4 hours of sleep  She does not a lot during the day, falls asleep 1 drives Does not exercise  Sleep study was from January 2020-titrated to BiPAP 27/22  She is not very active Has multiple comorbidities  Sedentary lifestyle Does have lymphedema, this does not limit her ability to get around   Usually tries to go to bed at 1130, takes about 15 minutes to fall asleep, wakes up after about every 1 to 11/2 hours, gets up finally about 5 AM  Weight has been up and down  Morbidly obese  Other health issues include hypertension, history of heart failure, pulmonary hypertension History of stroke, 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 Aortic stenosis 03/23/2013   Overview:  02/18/13 TTE EF >55%. Critical AS with mean Ao valve gradient of 82 mm Hg. No AI. No MR, PR, mild TR. Estimated RVSP 30 mm Hg.  . Bicuspid aortic valve   . Edema of both legs 02/20/2017  . 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 Heart failure ([place])   . HTN (hypertension) 03/23/2013  . 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 Morbid obesity ([place]) 02/20/2017  . Sleep apnea    Social History   Socioeconomic History  .  Marital status: Married    Spouse name: Not on file  . Number of children: Not on file  . Years of education: Not on file  . Highest education level: Not on file  Occupational History  . Not on file  Tobacco Use  . Smoking status: Never Smoker  . Smokeless tobacco: Never Used  Substance and Sexual Activity  . Alcohol use: No  . Drug use: No  . Sexual activity: Not Currently  Other Topics Concern  . Not on file  Social History Narrative  . Not on file   Social Determinants of Health   Financial Resource Strain:   . Difficulty of Paying Living Expenses: Not on file  Food Insecurity:   . Worried About Charity fundraiser in the Last Year: Not on file  . Ran Out of Food in the Last Year: Not on file  Transportation Needs:   . Lack of Transportation (Medical): Not on file  . Lack of Transportation (Non-Medical): Not on file  Physical Activity:   . Days of Exercise per Week: Not on file  . Minutes of Exercise per Session: Not on file  Stress:   . Feeling of Stress : Not on file  Social Connections:   . Frequency of Communication with Friends and Family: Not on file  .  Frequency of Social Gatherings with Friends and Family: Not on file  . Attends Religious Services: Not on file  . Active Member of Clubs or Organizations: Not on file  . Attends Archivist Meetings: Not on file  . Marital Status: Not on file  Intimate Partner Violence:   . Fear of Current or Ex-Partner: Not on file  . Emotionally Abused: Not on file  . Physically Abused: Not on file  . Sexually Abused: Not on file   Family History  Problem Relation Age of Onset  . Parkinson's disease Father   . Heart attack Mother    Review of Systems  Constitutional: Positive for fatigue. Negative for activity change.  HENT: Negative.   Eyes: Negative.   Respiratory: Positive for apnea.   Cardiovascular: Positive for leg swelling. Negative for chest pain.  Gastrointestinal: Negative.  Negative for abdominal  distention and abdominal pain.  Endocrine: Negative.   Genitourinary: Negative.   Psychiatric/Behavioral: Positive for sleep disturbance.  All other systems reviewed and are negative.     Objective:   Physical Exam Constitutional:      General: She is not in acute distress.    Appearance: She is obese. She is not ill-appearing.  HENT:     Head: Normocephalic and atraumatic.     Nose: No congestion or rhinorrhea.     Mouth/Throat:     Mouth: Mucous membranes are moist.     Pharynx: No oropharyngeal exudate or posterior oropharyngeal erythema.  Eyes:     Extraocular Movements: Extraocular movements intact.     Pupils: Pupils are equal, round, and reactive to light.  Cardiovascular:     Rate and Rhythm: Normal rate and regular rhythm.     Heart sounds: No murmur.  Pulmonary:     Effort: Pulmonary effort is normal. No respiratory distress.     Breath sounds: No wheezing.  Musculoskeletal:        General: Swelling present. Normal range of motion.     Cervical back: Normal range of motion and neck supple. No rigidity.     Right lower leg: Edema present.     Left lower leg: Edema present.  Lymphadenopathy:     Cervical: No cervical adenopathy.  Neurological:     Mental Status: She is alert.  Psychiatric:        Mood and Affect: Mood normal.    There were no vitals filed for this visit. Results of the [facility] flowsheet 06/21/2019  Sitting and reading 2  Watching TV 2  Sitting, inactive in a public place (e.g. a theatre or a meeting) 1  As a passenger in a car for an hour without a break 2  Lying down to rest in the afternoon when circumstances permit 1  Sitting and talking to someone 1  Sitting quietly after a lunch without alcohol 2  In a car, while stopped for a few minutes in traffic 1  Total score 12    Sleep study from January 2020 reviewed showing pressure requirement of 27/22 for severe obstructive sleep apnea    Assessment & Plan:  .  Severe obstructive sleep  apnea -Noncompliant with BiPAP  .  Severe deconditioning -She has not been doing much of getting around -Weight is going higher  .  Superobesity -Weight is higher than the last time she was here  .  Likely obesity hypoventilation -Increase in weight puts her at increased risk  .  Sleep onset and maintenance insomnia   Plan: Pathophysiology  of sleep disordered breathing discussed with the patient  Treatment options for sleep disordered breathing discussed with patient  Importance of compliance with BiPAP therapy discussed with the patient-she needs to set alarm to remind ourselves to put the BiPAP on prior to going to bed  Increasing activity during the day will be vital to getting better sleep at night as well-this will also hopefully help that she is not napping all the time during the day  Avoiding daytime naps as possible  Compliance with BiPAP is important-risk of worsening heart failure, risk of worsening pulmonary hypertension and risk of death discussed  I will see her back in the office in about 3 months  Continue ramelteon 8 mg nightly for insomnia  It will be challenging to get consolidated sleep at night if he is not very active during the day and she takes multiple naps during the day

## 2019-08-27 ENCOUNTER — Encounter: Payer: BC Managed Care – PPO | Attending: Physician Assistant | Admitting: Physician Assistant

## 2019-08-27 ENCOUNTER — Ambulatory Visit: Payer: BC Managed Care – PPO | Admitting: Occupational Therapy

## 2019-08-27 DIAGNOSIS — G4733 Obstructive sleep apnea (adult) (pediatric): Secondary | ICD-10-CM | POA: Insufficient documentation

## 2019-08-27 DIAGNOSIS — Z952 Presence of prosthetic heart valve: Secondary | ICD-10-CM | POA: Insufficient documentation

## 2019-08-27 DIAGNOSIS — I2510 Atherosclerotic heart disease of native coronary artery without angina pectoris: Secondary | ICD-10-CM | POA: Diagnosis not present

## 2019-08-27 DIAGNOSIS — L97812 Non-pressure chronic ulcer of other part of right lower leg with fat layer exposed: Secondary | ICD-10-CM | POA: Insufficient documentation

## 2019-08-27 DIAGNOSIS — L97822 Non-pressure chronic ulcer of other part of left lower leg with fat layer exposed: Secondary | ICD-10-CM | POA: Diagnosis not present

## 2019-08-27 DIAGNOSIS — I10 Essential (primary) hypertension: Secondary | ICD-10-CM | POA: Diagnosis not present

## 2019-08-27 DIAGNOSIS — I890 Lymphedema, not elsewhere classified: Secondary | ICD-10-CM | POA: Diagnosis present

## 2019-08-27 DIAGNOSIS — Z7901 Long term (current) use of anticoagulants: Secondary | ICD-10-CM | POA: Diagnosis not present

## 2019-08-27 NOTE — Progress Notes (Signed)
NAVEEN, VERDUN ([id number]) Visit Report for 08/27/2019 Chief Complaint Document Details Patient Name: [name], [name]. Date of Service: 08/27/2019 2:15 PM Medical Record Number: [id number] Patient Account Number: [id number] Date of Birth/Sex: 26-Jun-1956 (63 y.o. F) Treating RN: Montey Hora Primary Care Provider: Neta Ehlers Other Clinician: Referring Provider: Neta Ehlers Treating Provider/Extender: Melburn Hake, Wilsie Kern Weeks in Treatment: 0 Information Obtained from: Patient Chief Complaint Bilateral LE Lymphedema and ulcers Electronic Signature(s) Signed: 08/27/2019 2:43:44 PM By: Worthy Keeler PA-C Entered By: Worthy Keeler on 08/27/2019 14:43:43 Catlett, [name] ([id number]) -------------------------------------------------------------------------------- HPI Details Patient Name: Martha Downs L. Date of Service: 08/27/2019 2:15 PM Medical Record Number: [id number] Patient Account Number: [id number] Date of Birth/Sex: July 06, 1956 (63 y.o. F) Treating RN: Montey Hora Primary Care Provider: Neta Ehlers Other Clinician: Referring Provider: Neta Ehlers Treating Provider/Extender: Melburn Hake, Goldie Tregoning Weeks in Treatment: 0 History of Present Illness HPI Description: 08/27/2019 patient presents today for initial evaluation here in our clinic concerning issues that she has been having with her bilateral lower extremities with lymphedema and weeping. She is previously been a patient over the past year of Dr. Jerilee Hoh at the wound care center in [facility]. With that being said she subsequently was recommended by the lymphedema clinic here in [place] to follow-up with [place] here instead of going back to [place] since they were in closer contact with the clinic here than they are obviously. With that being said the patient states that is why she came her way in order to be evaluated at this point. Fortunately there is no signs of active infection at this  time. No fevers, chills, nausea, vomiting, or diarrhea. The patient does have a history of lymphedema, hypertension, long-term use of anticoagulant therapy, and obstructive sleep apnea. During the visit today and from checking into the discharge she was prone to unexpectedly falling asleep. I did discuss this with her daughter as well who states that this is very common for her she states she is not really sure how compliant her mother is with her CPAP machine that may be part of the issue. The patient did have ABIs performed which revealed have copies of his well today that was on 05/01/2019 and it showed a right ABI of 1.17 with a TBI of 1.12 and a left ABI of 1.24 with a TBI of 0.96. She has had 2 transfusions in the past 2 weeks apparently as well. Electronic Signature(s) Signed: 08/27/2019 4:32:02 PM By: Worthy Keeler PA-C Previous Signature: 08/27/2019 4:28:57 PM Version By: Worthy Keeler PA-C Entered By: Worthy Keeler on 08/27/2019 16:32:01 Fahrner, [name] ([id number]) -------------------------------------------------------------------------------- Physical Exam Details Patient Name: Martha Downs L. Date of Service: 08/27/2019 2:15 PM Medical Record Number: [id number] Patient Account Number: [id number] Date of Birth/Sex: Dec 23, 1955 (63 y.o. F) Treating RN: Montey Hora Primary Care Provider: Neta Ehlers Other Clinician: Referring Provider: Neta Ehlers Treating Provider/Extender: Melburn Hake, Monee Dembeck Weeks in Treatment: 0 Constitutional patient is hypertensive.. pulse regular and within target range for patient.Marland Kitchen respirations regular, non-labored and within target range for patient.Marland Kitchen temperature within target range for patient.. Well-nourished and well-hydrated in no acute distress. Eyes conjunctiva clear no eyelid edema noted. pupils equal round and reactive to light and accommodation. Ears, Nose, Mouth, and Throat no gross abnormality of ear auricles or external  auditory canals. normal hearing noted during conversation. mucus membranes moist. Respiratory normal breathing without difficulty. clear to auscultation bilaterally. Cardiovascular regular rate and rhythm with normal  S1, S2. 1+ dorsalis pedis/posterior tibialis pulses. no clubbing, cyanosis, significant edema, <3 sec cap refill. Gastrointestinal (GI) soft, non-tender, non-distended, +BS. no ventral hernia noted. Musculoskeletal normal gait and posture. no significant deformity or arthritic changes, no loss or range of motion, no clubbing. Psychiatric this patient is able to make decisions and demonstrates good insight into disease process. Alert and Oriented x 3. pleasant and cooperative. Notes Patient's wound bed currently showed signs of really no significant slough buildup although she did have extensive ulcerations especially in the left lower extremity which was much more swollen even in the right lower extremity. With that being said there did not appear to be any evidence of active infection at this time based on what I am seeing which is good news. She does need significant compression therapy to try to help with edema control however. Electronic Signature(s) Signed: 08/27/2019 4:31:17 PM By: Worthy Keeler PA-C Entered By: Worthy Keeler on 08/27/2019 16:31:16 Barley, [name] ([id number]) -------------------------------------------------------------------------------- Physician Orders Details Patient Name: Martha Siren. Date of Service: 08/27/2019 2:15 PM Medical Record Number: [id number] Patient Account Number: [id number] Date of Birth/Sex: 1955/10/11 (63 y.o. 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 - Do not get your wraps wet Wound #2 Right,Circumferential Lower Leg o Cleanse wound with mild soap and water o May shower with protection. - Do not get your wraps wet Primary Wound Dressing Wound #1 Left,Circumferential Lower Leg o XtraSorb Wound #2 Right,Circumferential Lower Leg o XtraSorb Secondary Dressing Wound #1 Left,Circumferential Lower Leg o ABD pad Wound #2 Right,Circumferential Lower Leg o ABD pad Dressing Change Frequency o Other: - twice weekly Follow-up Appointments o Return Appointment in 1 week. - 08/30/19 or 08/31/19 Edema Control Wound #1 Left,Circumferential Lower Leg o 4 Layer Compression System - Bilateral Wound #2 Right,Circumferential Lower Leg o 4 Layer Compression System - Bilateral LEVORA, MURRAH ([id number]) Electronic Signature(s) Signed: 08/27/2019 3:43:45 PM By: Montey Hora Signed: 08/27/2019 4:46:07 PM By: Worthy Keeler PA-C Entered By: Montey Hora on 08/27/2019 15:01:38 Holloway, Ector L. ([id number]) -------------------------------------------------------------------------------- Problem List Details Patient Name: [name], [name] Date of Service: 08/27/2019 2:15 PM Medical Record Number: [id number] Patient Account Number: [id number] Date of Birth/Sex: 04/30/1956 (63 y.o. 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 other part of right lower leg 08/27/2019 No Yes with fat layer exposed L97.822 Non-pressure chronic ulcer of other part of left lower leg with 08/27/2019 No Yes fat layer exposed [place] (primary) hypertension 08/27/2019 No Yes Z79.01 Long term (current) use of anticoagulants 08/27/2019 No Yes Inactive Problems Resolved Problems Electronic Signature(s) Signed: 08/27/2019 2:42:23 PM By: Worthy Keeler PA-C Entered By: Worthy Keeler on 08/27/2019  14:42:23 ARRAYA, PLATTEN ([id number]) -------------------------------------------------------------------------------- Progress Note Details Patient Name: [name], BRUGGEMAN. Date of Service: 08/27/2019 2:15 PM Medical Record Number: [id number] Patient Account Number: [id number] Date of Birth/Sex: [birth year] (63 y.o. F) Treating RN: Montey Hora Primary Care Provider: Neta Ehlers Other Clinician: Referring Provider: Neta Ehlers Treating Provider/Extender: Melburn Hake, Erienne Spelman Weeks in Treatment: 0 Subjective Chief Complaint Information obtained from Patient Bilateral LE Lymphedema and ulcers History of Present Illness (HPI) 08/27/2019 patient presents today for initial evaluation here in our clinic concerning issues that she has been having with her bilateral lower extremities with lymphedema and weeping. She is previously been a patient over the past year of Dr. Jerilee Hoh at the wound care center in [facility]. With that being said she subsequently was recommended by the lymphedema clinic here in [place] to follow-up with [place] here instead of going back to [place] since they were in closer contact with the clinic here than they are obviously. With that being said the patient states that is why she came her way in order to be evaluated at this point. Fortunately there is no signs  of active infection at this time. No fevers, chills, nausea, vomiting, or diarrhea. The patient does have a history of lymphedema, hypertension, long-term use of anticoagulant therapy, and obstructive sleep apnea. During the visit today and from checking into the discharge she was prone to unexpectedly falling asleep. I did discuss this with her daughter as well who states that this is very common for her she states she is not really sure how compliant her mother is with her CPAP machine that may be part of the issue. The patient did have ABIs performed which revealed have copies of his well today that was on 05/01/2019 and it showed a right ABI of 1.17 with a TBI of 1.12 and a left ABI of 1.24 with a TBI of 0.96. She has had 2 transfusions in the past 2 weeks apparently as well. Patient History Information obtained from Patient. Allergies vancomycin Family History Cancer - Maternal Grandparents, No family history of Diabetes, Heart Disease, Hypertension, Kidney Disease, Lung Disease, Seizures, Stroke, Thyroid Problems, Tuberculosis. Social History Never smoker, Marital Status - Married, Alcohol Use - Never, Drug Use - No History. 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 Review of Systems (ROS) Constitutional Symptoms (General Health) [place], [place] ([id number]) Denies complaints or symptoms of Fatigue, Fever, Chills, Marked Weight Change. Eyes Complains or has symptoms of Vision Changes. Ear/Nose/Mouth/Throat Denies complaints or symptoms of Difficult clearing ears, Sinusitis. Hematologic/Lymphatic Denies complaints or symptoms of Bleeding / Clotting Disorders, Human Immunodeficiency  Virus. Gastrointestinal Denies complaints or symptoms of Frequent diarrhea, Nausea, Vomiting. Endocrine Denies complaints or symptoms of Hepatitis, Thyroid disease, Polydypsia (Excessive Thirst). Genitourinary Denies complaints or symptoms of Kidney failure/ Dialysis, Incontinence/dribbling. Immunological Denies complaints or symptoms of Hives, Itching. Integumentary (Skin) Complains or has symptoms of Wounds, Bleeding or bruising tendency. Denies complaints or symptoms of Breakdown, Swelling. Musculoskeletal Denies complaints or symptoms of Muscle Pain, Muscle Weakness. Neurologic Denies complaints or symptoms of Numbness/parasthesias, Focal/Weakness. Psychiatric Denies complaints or symptoms of Anxiety, Claustrophobia. Objective Constitutional patient is hypertensive.. pulse regular and within target range for patient.Marland Kitchen respirations regular, non-labored and within target range for patient.Marland Kitchen temperature within target range for patient.Marland Kitchen  Well-nourished and well-hydrated in no acute distress. Vitals Time Taken: 2:15 PM, Height: 62 in, Source: Measured, Weight: 290 lbs, Source: Measured, BMI: 53, Temperature: 99.0 F, Pulse: 72 bpm, Respiratory Rate: 20 breaths/min, Blood Pressure: 185/60 mmHg, Pulse Oximetry: 100 %. Eyes conjunctiva clear no eyelid edema noted. pupils equal round and reactive to light and accommodation. Ears, Nose, Mouth, and Throat no gross abnormality of ear auricles or external auditory canals. normal hearing noted during conversation. mucus membranes moist. Respiratory normal breathing without difficulty. clear to auscultation bilaterally. Cardiovascular regular rate and rhythm with normal S1, S2. 1+ dorsalis pedis/posterior tibialis pulses. no clubbing, cyanosis, significant edema, Gastrointestinal (GI) soft, non-tender, non-distended, +BS. no ventral hernia noted. Peto, [place] ([id number]) Musculoskeletal normal gait and posture. no significant  deformity or arthritic changes, no loss or range of motion, no clubbing. Psychiatric this patient is able to make decisions and demonstrates good insight into disease process. Alert and Oriented x 3. pleasant and cooperative. General Notes: Patient's wound bed currently showed signs of really no significant slough buildup although she did have extensive ulcerations especially in the left lower extremity which was much more swollen even in the right lower extremity. With that being said there did not appear to be any evidence of active infection at this time based on what I am seeing which is good news. She does need significant compression therapy to try to help with edema control however. Integumentary (Hair, Skin) Wound #1 status is Open. Original cause of wound was Gradually Appeared. The wound is located on the Left,Circumferential Lower Leg. The wound measures 23cm length x 57cm width x 0.1cm depth; 1029.657cm^2 area and 102.966cm^3 volume. There is Fat Layer (Subcutaneous Tissue) Exposed exposed. There is no tunneling or undermining noted. There is a large amount of serous drainage noted. The wound margin is indistinct and nonvisible. There is medium (34-66%) pink granulation within the wound bed. There is a medium (34-66%) amount of necrotic tissue within the wound bed including Adherent Slough. Wound #2 status is Open. Original cause of wound was Gradually Appeared. The wound is located on the Right,Circumferential Lower Leg. The wound measures 15cm length x 24cm width x 0.1cm depth; 282.743cm^2 area and 28.274cm^3 volume. There is no tunneling or undermining noted. There is a large amount of serous drainage noted. The wound margin is flat and intact. There is no granulation within the wound bed. There is a small (1-33%) amount of necrotic tissue within the wound bed including Adherent Slough. Assessment Active Problems ICD-10 Lymphedema, not elsewhere classified Non-pressure chronic  ulcer of other part of right lower leg with fat layer exposed Non-pressure chronic ulcer of other part of left lower leg with fat layer exposed Essential (primary) hypertension Long term (current) use of anticoagulants Procedures Wound #1 Pre-procedure diagnosis of Wound #1 is a Lymphedema located on the Left,Circumferential Lower Leg . There was a Four Layer Compression Therapy Procedure with a pre-treatment ABI of 1.2 by Montey Hora, RN. Post procedure Diagnosis Wound #1: Same as Pre-Procedure Wound #2 Pre-procedure diagnosis of Wound #2 is a Lymphedema located on the Right,Circumferential Lower Leg . There was a Four Layer Compression Therapy Procedure with a pre-treatment ABI of 1.2 by Montey Hora, RN. Post procedure Diagnosis Wound #2: Same as Pre-Procedure [name], ORLOSKY. ([id number]) Plan Wound Cleansing: Wound #1 Left,Circumferential Lower Leg: Cleanse wound with mild soap and water May shower with protection. - Do not get your wraps wet Wound #2 Right,Circumferential Lower Leg: Cleanse wound with mild soap and  water May shower with protection. - Do not get your wraps wet Primary Wound Dressing: Wound #1 Left,Circumferential Lower Leg: XtraSorb Wound #2 Right,Circumferential Lower Leg: XtraSorb Secondary Dressing: Wound #1 Left,Circumferential Lower Leg: ABD pad Wound #2 Right,Circumferential Lower Leg: ABD pad Dressing Change Frequency: Other: - twice weekly Follow-up Appointments: Return Appointment in 1 week. - 08/30/19 or 08/31/19 Edema Control: Wound #1 Left,Circumferential Lower Leg: 4 Layer Compression System - Bilateral Wound #2 Right,Circumferential Lower Leg: 4 Layer Compression System - Bilateral 1. My suggestion at this time based on what I am seeing is good to be that we go ahead and initiate treatment with a 4-layer compression wrap bilaterally. I do want to see her back next week as well. Subsequently I do think that using XtraSorb mainly as the  dressing of choice is good to be ideal due to the amount of drainage that she is having. 2. I am going to suggest as well that we continue to monitor the patient ongoing with regard to her swelling over the next week I want to actually see her back on Tuesday in order to rewrap her and ensure everything is doing okay. She is in agreement that plan. 3. The patient is concerned about the fact that the wraps may not last that long even for that reason we will get a look into seeing about home health for her she would like to do that and again she tells me here in the clinic today that she mainly goes to the grocery store and right now doctor's appointment she is really not out and about according to what she tells me. Nonetheless we would definitely look into home health services for her as well. We will see patient back for reevaluation in 1 week here in the clinic. If anything worsens or changes patient will contact our office for additional recommendations. Electronic Signature(s) Signed: 08/27/2019 4:33:34 PM By: Worthy Keeler PA-C Entered By: Worthy Keeler on 08/27/2019 16:33:34 Laursen, [name] ([id number]) BOWMAN, [name] ([id number]) -------------------------------------------------------------------------------- ROS/PFSH Details Patient Name: [name], [name] Date of Service: 08/27/2019 2:15 PM Medical Record Number: [id number] Patient Account Number: [id number] Date of Birth/Sex: [birth year] (63 y.o. F) Treating RN: Cornell Barman Primary Care Provider: Neta Ehlers Other Clinician: Referring Provider: Neta Ehlers Treating Provider/Extender: Melburn Hake, Samon Dishner Weeks in Treatment: 0 Information Obtained From Patient Constitutional Symptoms (General Health) Complaints and Symptoms: Negative for: Fatigue; Fever; Chills; Marked Weight Change Eyes Complaints and Symptoms: Positive for: Vision Changes Ear/Nose/Mouth/Throat Complaints and Symptoms: Negative for: Difficult  clearing ears; Sinusitis Hematologic/Lymphatic Complaints and Symptoms: Negative for: Bleeding / Clotting Disorders; Human Immunodeficiency Virus Medical History: Positive for: Anemia - 2 blood transfusions in the last 2 weeks Gastrointestinal Complaints and Symptoms: Negative for: Frequent diarrhea; Nausea; Vomiting Endocrine Complaints and Symptoms: Negative for: Hepatitis; Thyroid disease; Polydypsia (Excessive Thirst) Medical History: Negative for: Type I Diabetes; Type II Diabetes Genitourinary Complaints and Symptoms: Negative for: Kidney failure/ Dialysis; Incontinence/dribbling Immunological Complaints and Symptoms: Negative for: Hives; Itching Integumentary (Skin) Surette, [name] Complaints and Symptoms: Positive for: Wounds; Bleeding or bruising tendency Negative for: Breakdown; Swelling Musculoskeletal Complaints and Symptoms: Negative for: Muscle Pain; Muscle Weakness Neurologic Complaints and Symptoms: Negative for: Numbness/parasthesias; Focal/Weakness Psychiatric Complaints and Symptoms: Negative for: Anxiety; Claustrophobia Cardiovascular Medical History: Positive for: Coronary Artery Disease - Aortic Valve replacement; Hypertension Oncologic Immunizations Pneumococcal Vaccine: Received Pneumococcal Vaccination: Yes Implantable Devices Yes Hospitalization / Surgery History Type of Hospitalization/Surgery sept 2020 Family and Social History Cancer:  Yes - Maternal Grandparents; Diabetes: No; Heart Disease: No; Hypertension: No; Kidney Disease: No; Lung Disease: No; Seizures: No; Stroke: No; Thyroid Problems: No; Tuberculosis: No; Never smoker; Marital Status - Married; Alcohol Use: Never; Drug Use: No History Electronic Signature(s) Signed: 08/27/2019 3:52:10 PM By: Gretta Cool, BSN, RN, CWS, Kim RN, BSN Signed: 08/27/2019 4:46:07 PM By: Worthy Keeler PA-C Entered By: Gretta Cool, BSN, RN, CWS, Kim on 08/27/2019 14:26:06 Byrer, [name]  ([id number]) -------------------------------------------------------------------------------- SuperBill Details Patient Name: [name], [name] Date of Service: 08/27/2019 Medical Record Number: [id number] Patient Account Number: [id number] Date of Birth/Sex: [birth year] (63 y.o. 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 Physician Procedures CPT4 Code Description: [id number] - WC PHYS LEVEL 4 - NEW PT ICD-10 Diagnosis Description I89.0 Lymphedema, not elsewhere classified [id number] Non-pressure chronic ulcer of other part of right lower leg wit L97.822 Non-pressure chronic ulcer of other  part of left lower leg with I10 Essential (primary) hypertension Modifier: h fat layer expos fat layer expose Quantity: 1 ed d Electronic Signature(s) Signed: 08/27/2019 4:34:43 PM By: Worthy Keeler PA-C Previous Signature: 08/27/2019 3:43:45 PM Version By: Montey Hora Entered By: Worthy Keeler on 08/27/2019 16:34:43

## 2019-08-27 NOTE — Progress Notes (Signed)
[name] ([id number]) Visit Report for 08/27/2019 Allergy List Details Patient Name: [name], [name]. Date of Service: 08/27/2019 2:15 PM Medical Record Number: [id number] Patient Account Number: [id number] Date of Birth/Sex: December 11, 1955 (63 y.o. F) Treating RN: Cornell Barman Primary Care Mekhia Brogan: Neta Ehlers Other Clinician: Referring Julian Askin: Neta Ehlers Treating Arzella Rehmann/Extender: Melburn Hake, HOYT Weeks in Treatment: 0 Allergies Active Allergies vancomycin Allergy Notes Electronic Signature(s) Signed: 08/27/2019 3:52:10 PM By: Gretta Cool, BSN, RN, CWS, Kim RN, BSN Entered By: Gretta Cool, BSN, RN, CWS, Kim on 08/27/2019 14:19:28 Mckenzie, [name] ([id number]) -------------------------------------------------------------------------------- Arrival Information Details Patient Name: [name], [name] Date of Service: 08/27/2019 2:15 PM Medical Record Number: [id number] Patient Account Number: [id number] Date of Birth/Sex: July 29, 1956 (63 y.o. F) Treating RN: Cornell Barman Primary Care Kameshia Madruga: Neta Ehlers Other Clinician: Referring Myers Tutterow: Neta Ehlers Treating Elyas Villamor/Extender: Sharalyn Ink in Treatment: 0 Visit Information Patient Arrived: Walker Arrival Time: 14:16 Accompanied By: self Transfer Assistance: None Patient Identification Verified: Yes Secondary Verification Process Yes Completed: Patient Has Alerts: Yes Patient Alerts: Patient on Blood Thinner warfarin Not Diabetic Electronic Signature(s) Signed: 08/27/2019 3:52:10 PM By: Gretta Cool, BSN, RN, CWS, Kim RN, BSN Entered By: Gretta Cool, BSN, RN, CWS, Kim on 08/27/2019 14:17:27 [place], [name] ([id number]) -------------------------------------------------------------------------------- Compression Therapy Details Patient Name: HYUN, RANDT L. Date of Service: 08/27/2019 2:15 PM Medical Record Number: [id number] Patient Account Number: [id number] Date of Birth/Sex: [birth year] (63 y.o. F) Treating RN:  Montey Hora Primary Care Sherisa Gilvin: Neta Ehlers Other Clinician: Referring Aeralyn Barna: Neta Ehlers Treating Terrill Wauters/Extender: Melburn Hake, HOYT Weeks in Treatment: 0 Compression Therapy Performed for Wound Assessment: Wound #1 Left,Circumferential Lower Leg Performed By: Clinician Montey Hora, RN Compression Type: Four Layer Pre Treatment ABI: 1.2 Post Procedure Diagnosis Same as Pre-procedure Electronic Signature(s) Signed: 08/27/2019 3:43:45 PM By: Montey Hora Entered By: Montey Hora on 08/27/2019 14:57:17 Piccininni, Bekki L. ([id number]) -------------------------------------------------------------------------------- Compression Therapy Details Patient Name: Martha Downs L. Date of Service: 08/27/2019 2:15 PM Medical Record Number: [id number] Patient Account Number: [id number] Date of Birth/Sex: 08-Nov-1955 (63 y.o. F) Treating RN: Montey Hora Primary Care Allex Madia: Neta Ehlers Other Clinician: Referring Shai Mckenzie: Neta Ehlers Treating Carilyn Woolston/Extender: Melburn Hake, HOYT Weeks in Treatment: 0 Compression Therapy Performed for Wound Assessment: Wound #2 Right,Circumferential Lower Leg Performed By: Clinician Montey Hora, RN Compression Type: Four Layer Pre Treatment ABI: 1.2 Post Procedure Diagnosis Same as Pre-procedure Electronic Signature(s) Signed: 08/27/2019 3:43:45 PM By: Montey Hora Entered By: Montey Hora on 08/27/2019 14:57:17 Olliff, [name] ([id number]) -------------------------------------------------------------------------------- Encounter Discharge Information Details Patient Name: Martha Downs L. Date of Service: 08/27/2019 2:15 PM Medical Record Number: [id number] Patient Account Number: [id number] Date of Birth/Sex: 01/18/1956 (63 y.o. F) Treating RN: Montey Hora Primary Care Yurika Pereda: Neta Ehlers Other Clinician: Referring Coral Soler: Neta Ehlers Treating Amrita Radu/Extender: Melburn Hake, HOYT Weeks in Treatment:  0 Encounter Discharge Information Items Discharge Condition: Stable Ambulatory Status: Walker Discharge Destination: Home Transportation: Private Auto Accompanied By: self Schedule Follow-up Appointment: Yes Clinical Summary of Care: Electronic Signature(s) Signed: 08/27/2019 3:43:45 PM By: Montey Hora Entered By: Montey Hora on 08/27/2019 15:06:22 No, [name] ([id number]) -------------------------------------------------------------------------------- Lower Extremity Assessment Details Patient Name: Martha Downs L. Date of Service: 08/27/2019 2:15 PM Medical Record Number: [id number] Patient Account Number: [id number] Date of Birth/Sex: [birth year] (63 y.o. F) Treating RN: Cornell Barman Primary Care Wynonna Fitzhenry: Neta Ehlers Other Clinician: Referring Mirl Hillery: Neta Ehlers Treating Blaire Hodsdon/Extender: Melburn Hake, HOYT Weeks in Treatment: 0 Edema Assessment Assessed: [Left: No] [Right:  No] [Left: Edema] [Right: :] Calf Left: Right: Point of Measurement: 32 cm From Medial Instep 62 cm 58 cm Ankle Left: Right: Point of Measurement: 10 cm From Medial Instep 36 cm 32 cm Vascular Assessment Pulses: Dorsalis Pedis Palpable: [Left:Yes] [Right:Yes] Posterior Tibial Palpable: [Left:Yes] [Right:Yes] Notes ABIs recently completed at [facility]. L 1.24; R 1.17, TBI L .96 R 1.12 Electronic Signature(s) Signed: 08/27/2019 3:52:10 PM By: Gretta Cool, BSN, RN, CWS, Kim RN, BSN Entered By: Gretta Cool, BSN, RN, CWS, Kim on 08/27/2019 14:37:08 Segal, [name] ([id number]) -------------------------------------------------------------------------------- Multi Wound Chart Details Patient Name: Martha Downs L. Date of Service: 08/27/2019 2:15 PM Medical Record Number: [id number] Patient Account Number: [id number] Date of Birth/Sex: 1956-03-23 (63 y.o. F) Treating RN: Montey Hora Primary Care Beda Dula: Neta Ehlers Other Clinician: Referring Sue Mcalexander: Neta Ehlers Treating  Chalee Hirota/Extender: Melburn Hake, HOYT Weeks in Treatment: 0 Vital Signs Height(in): 62 Pulse(bpm): 72 Weight(lbs): 290 Blood Pressure(mmHg): 185/60 Body Mass Index(BMI): 53 Temperature(F): 99.0 Respiratory Rate 20 (breaths/min): Photos: [N/A:N/A] Wound Location: Left Lower Leg - Right Lower Leg - N/A Circumferential Circumferential Wounding Event: Gradually Appeared Gradually Appeared N/A Primary Etiology: Lymphedema Lymphedema N/A Comorbid History: Anemia, Coronary Artery Anemia, Coronary Artery N/A Disease, Hypertension Disease, Hypertension Date Acquired: 08/04/2019 08/04/2019 N/A Weeks of Treatment: 0 0 N/A Wound Status: Open Open N/A Measurements L x W x D 23x57x0.1 15x24x0.1 N/A (cm) Area (cm) : 1029.657 282.743 N/A Volume (cm) : 102.966 28.274 N/A % Reduction in Area: N/A 0.00% N/A % Reduction in Volume: N/A 0.00% N/A Classification: Full Thickness Without Partial Thickness N/A Exposed Support Structures Exudate Amount: Large Large N/A Exudate Type: Serous Serous N/A Exudate Color: amber amber N/A Wound Margin: Indistinct, nonvisible Flat and Intact N/A Granulation Amount: Medium (34-66%) None Present (0%) N/A Granulation Quality: Pink N/A N/A Necrotic Amount: Medium (34-66%) Small (1-33%) N/A Exposed Structures: Fat Layer (Subcutaneous Fascia: No N/A Tissue) Exposed: Yes Fat Layer (Subcutaneous Fascia: No Tissue) Exposed: No Tendon: No Tendon: No Muscle: No Muscle: No Zavada, Najmah L. ([id number]) Joint: No Joint: No Bone: No Bone: No Epithelialization: None Medium (34-66%) N/A Treatment Notes Electronic Signature(s) Signed: 08/27/2019 3:43:45 PM By: Montey Hora Entered By: Montey Hora on 08/27/2019 14:52:47 [place], [name] ([id number]) -------------------------------------------------------------------------------- [place] Details Patient Name: [name]. Date of Service: 08/27/2019 2:15 PM Medical Record Number:  [id number] Patient Account Number: [id number] Date of Birth/Sex: Dec 22, 1955 (63 y.o. F) Treating RN: Montey Hora Primary Care Maddoxx Burkitt: Neta Ehlers Other Clinician: Referring Birtha Hatler: Neta Ehlers Treating Antoria Lanza/Extender: Melburn Hake, HOYT Weeks in Treatment: 0 Active Inactive Abuse / Safety / Falls / Self Care Management Nursing Diagnoses: Potential for falls Goals: Patient will not experience any injury related to falls Date Initiated: 08/27/2019 Target Resolution Date: 11/20/2019 Goal Status: Active Interventions: Assess fall risk on admission and as needed Notes: Orientation to the Wound Care Program Nursing Diagnoses: Knowledge deficit related to the wound healing center program Goals: Patient/caregiver will verbalize understanding of the [place] Program Date Initiated: 08/27/2019 Target Resolution Date: 11/20/2019 Goal Status: Active Interventions: Provide education on orientation to the wound center Notes: Venous Leg Ulcer Nursing Diagnoses: Potential for venous Insuffiency (use before diagnosis confirmed) Goals: Patient will maintain optimal edema control Date Initiated: 08/27/2019 Target Resolution Date: 11/20/2019 Goal Status: Active Interventions: Assess peripheral edema status every visit. ABEEHA, BOLLIN ([id number]) Notes: Wound/Skin Impairment Nursing Diagnoses: Impaired tissue integrity Goals: Ulcer/skin breakdown will heal within 14 weeks Date Initiated: 08/27/2019 Target Resolution Date: 11/20/2019 Goal Status: Active  Interventions: Assess patient/caregiver ability to obtain necessary supplies Assess patient/caregiver ability to perform ulcer/skin care regimen upon admission and as needed Assess ulceration(s) every visit Notes: Electronic Signature(s) Signed: 08/27/2019 3:43:45 PM By: Montey Hora Entered By: Montey Hora on 08/27/2019 14:52:34 [place]line, Mckaylin L.  ([id number]) -------------------------------------------------------------------------------- Pain Assessment Details Patient Name: Martha Downs L. Date of Service: 08/27/2019 2:15 PM Medical Record Number: [id number] Patient Account Number: [id number] Date of Birth/Sex: Dec 10, 1955 (63 y.o. F) Treating RN: Cornell Barman Primary Care Roshon Duell: Neta Ehlers Other Clinician: Referring Makisha Marrin: Neta Ehlers Treating Amirr Achord/Extender: Melburn Hake, HOYT Weeks in Treatment: 0 Active Problems Location of Pain Severity and Description of Pain Patient Has Paino No Site Locations Pain Management and Medication Current Pain Management: Notes Patient denies pain at this time. Electronic Signature(s) Signed: 08/27/2019 3:52:10 PM By: Gretta Cool, BSN, RN, CWS, Kim RN, BSN Entered By: Gretta Cool, BSN, RN, CWS, Kim on 08/27/2019 14:18:09 [place], [name] ([id number]) -------------------------------------------------------------------------------- Patient/Caregiver Education Details Patient Name: HALEEMA, IODICE. Date of Service: 08/27/2019 2:15 PM Medical Record Number: [id number] Patient Account Number: [id number] Date of Birth/Gender: Dec 23, 1955 (63 y.o. F) Treating RN: Montey Hora Primary Care Physician: Neta Ehlers Other Clinician: Referring Physician: Neta Ehlers Treating Physician/Extender: Sharalyn Ink in Treatment: 0 Education Assessment Education Provided To: Patient Education Topics Provided Venous: Handouts: Other: need for compression Methods: Explain/Verbal Responses: State content correctly Electronic Signature(s) Signed: 08/27/2019 3:43:45 PM By: Montey Hora Entered By: Montey Hora on 08/27/2019 15:02:13 Nishiyama, Willie L. ([id number]) -------------------------------------------------------------------------------- Wound Assessment Details Patient Name: Martha Downs L. Date of Service: 08/27/2019 2:15 PM Medical Record Number: [id number] Patient Account  Number: [id number] Date of Birth/Sex: September 20, 1955 (64 y.o. F) Treating RN: Cornell Barman Primary Care Vinton Layson: Neta Ehlers Other Clinician: Referring Avy Barlett: Neta Ehlers Treating Zorina Mallin/Extender: Melburn Hake, HOYT Weeks in Treatment: 0 Wound Status Wound Number: 1 Primary Lymphedema Etiology: Wound Location: Left Lower Leg - Circumferential Wound Status: Open Wounding Event: Gradually Appeared Comorbid Anemia, Coronary Artery Disease, Date Acquired: 08/04/2019 History: Hypertension Weeks Of Treatment: 0 Clustered Wound: No Photos Wound Measurements Length: (cm) 23 Width: (cm) 57 Depth: (cm) 0.1 Area: (cm) 1029.657 Volume: (cm) 102.966 % Reduction in Area: % Reduction in Volume: Epithelialization: None Tunneling: No Undermining: No Wound Description Full Thickness Without Exposed Support Classification: Structures Wound Margin: Indistinct, nonvisible Exudate Large Amount: Exudate Type: Serous Exudate Color: amber Foul Odor After Cleansing: No Slough/Fibrino No Wound Bed Granulation Amount: Medium (34-66%) Exposed Structure Granulation Quality: Pink Fascia Exposed: No Necrotic Amount: Medium (34-66%) Fat Layer (Subcutaneous Tissue) Exposed: Yes Necrotic Quality: Adherent Slough Tendon Exposed: No Muscle Exposed: No Joint Exposed: No Bone Exposed: No [place], [place] L. ([id number]) Treatment Notes Wound #1 (Left, Circumferential Lower Leg) Notes xtrasorb, abd, 4 layer bilateral with unna to anchor Electronic Signature(s) Signed: 08/27/2019 3:52:10 PM By: Gretta Cool, BSN, RN, CWS, Kim RN, BSN Entered By: Gretta Cool, BSN, RN, CWS, Kim on 08/27/2019 14:33:11 [place], [name] ([id number]) -------------------------------------------------------------------------------- Wound Assessment Details Patient Name: Martha Downs L. Date of Service: 08/27/2019 2:15 PM Medical Record Number: [id number] Patient Account Number: [id number] Date of Birth/Sex: May 30, 1956 (63 y.o.  F) Treating RN: Cornell Barman Primary Care Jejuan Scala: Neta Ehlers Other Clinician: Referring Deola Rewis: Neta Ehlers Treating Olevia Westervelt/Extender: Melburn Hake, HOYT Weeks in Treatment: 0 Wound Status Wound Number: 2 Primary Lymphedema Etiology: Wound Location: Right Lower Leg - Circumferential Wound Status: Open Wounding Event: Gradually Appeared Comorbid Anemia, Coronary Artery Disease, Date Acquired: 08/04/2019 History: Hypertension Weeks Of Treatment: 0 Clustered Wound:  No Photos Wound Measurements Length: (cm) 15 Width: (cm) 24 Depth: (cm) 0.1 Area: (cm) 282.743 Volume: (cm) 28.274 % Reduction in Area: 0% % Reduction in Volume: 0% Epithelialization: Medium (34-66%) Tunneling: No Undermining: No Wound Description Classification: Partial Thickness Wound Margin: Flat and Intact Exudate Amount: Large Exudate Type: Serous Exudate Color: amber Foul Odor After Cleansing: No Slough/Fibrino No Wound Bed Granulation Amount: None Present (0%) Exposed Structure Necrotic Amount: Small (1-33%) Fascia Exposed: No Necrotic Quality: Adherent Slough Fat Layer (Subcutaneous Tissue) Exposed: No Tendon Exposed: No Muscle Exposed: No Joint Exposed: No Bone Exposed: No Treatment Notes ELLIANAH, BRIDGE. ([id number]) Wound #2 (Right, Circumferential Lower Leg) Notes xtrasorb, abd, 4 layer bilateral with unna to anchor Electronic Signature(s) Signed: 08/27/2019 3:52:10 PM By: Gretta Cool, BSN, RN, CWS, Kim RN, BSN Entered By: Gretta Cool, BSN, RN, CWS, Kim on 08/27/2019 14:35:19 [place], [name] ([id number]) -------------------------------------------------------------------------------- Vitals Details Patient Name: Martha Downs L. Date of Service: 08/27/2019 2:15 PM Medical Record Number: [id number] Patient Account Number: [id number] Date of Birth/Sex: 11/03/1955 (63 y.o. F) Treating RN: Cornell Barman Primary Care Ziyad Dyar: Neta Ehlers Other Clinician: Referring Jacole Capley: Neta Ehlers Treating Chelsye Suhre/Extender: Melburn Hake, HOYT Weeks in Treatment: 0 Vital Signs Time Taken: 14:15 Temperature (F): 99.0 Height (in): 62 Pulse (bpm): 72 Source: Measured Respiratory Rate (breaths/min): 20 Weight (lbs): 290 Blood Pressure (mmHg): 185/60 Source: Measured Reference Range: 80 - 120 mg / dl Body Mass Index (BMI): 53 Airway Pulse Oximetry (%): 100 Electronic Signature(s) Signed: 08/27/2019 3:52:10 PM By: Gretta Cool, BSN, RN, CWS, Kim RN, BSN Entered By: Gretta Cool, BSN, RN, CWS, Kim on 08/27/2019 14:29:48

## 2019-08-27 NOTE — Progress Notes (Signed)
[name], [name] ([id number]) Visit Report for 08/27/2019 Abuse/Suicide Risk Screen Details Patient Name: [name], VINT. Date of Service: 08/27/2019 2:15 PM Medical Record Number: [id number] Patient Account Number: [id number] Date of Birth/Sex: [birth year] (63 y.o. F) Treating RN: Cornell Barman Primary Care Jocelin Schuelke: Neta Ehlers Other Clinician: Referring Stachia Slutsky: Neta Ehlers Treating Aarnav Steagall/Extender: Melburn Hake, HOYT Weeks in Treatment: 0 Abuse/Suicide Risk Screen Items Answer ABUSE RISK SCREEN: Has anyone close to you tried to hurt or harm you recentlyo No Do you feel uncomfortable with anyone in your familyo No Has anyone forced you do things that you didnot want to doo No Electronic Signature(s) Signed: 08/27/2019 3:52:10 PM By: Gretta Cool, BSN, RN, CWS, Kim RN, BSN Entered By: Gretta Cool, BSN, RN, CWS, Kim on 08/27/2019 14:26:24 [place], [name] ([id number]) -------------------------------------------------------------------------------- Activities of Daily Living Details Patient Name: [name], [name] Date of Service: 08/27/2019 2:15 PM Medical Record Number: [id number] Patient Account Number: [id number] Date of Birth/Sex: 04-Jan-1956 (63 y.o. F) Treating RN: Cornell Barman Primary Care Arleny Kruger: Neta Ehlers Other Clinician: Referring Yessica Putnam: Neta Ehlers Treating Daffney Greenly/Extender: Melburn Hake, HOYT Weeks in Treatment: 0 Activities of Daily Living Items Answer Activities of Daily Living (Please select one for each item) Drive Automobile Not Able Take Medications Completely Able Use Telephone Completely Able Care for Appearance Completely Able Use Toilet Completely Able Bath / Shower Completely Able Dress Self Completely Able Feed Self Completely Able Walk Completely Able Get In / Out Bed Completely Able Housework Completely Able Prepare Meals Completely Able Handle Money Completely Able Shop for Self Completely Able Electronic Signature(s) Signed: 08/27/2019  3:52:10 PM By: Gretta Cool, BSN, RN, CWS, Kim RN, BSN Entered By: Gretta Cool, BSN, RN, CWS, Kim on 08/27/2019 14:26:52 [place], [name] ([id number]) -------------------------------------------------------------------------------- Education Screening Details Patient Name: [name], Martha Laming L. Date of Service: 08/27/2019 2:15 PM Medical Record Number: [id number] Patient Account Number: [id number] Date of Birth/Sex: 24-Dec-1955 (63 y.o. F) Treating RN: Cornell Barman Primary Care Shayan Bramhall: Neta Ehlers Other Clinician: Referring Kelty Szafran: Neta Ehlers Treating Maedell Hedger/Extender: Sharalyn Ink in Treatment: 0 Primary Learner Assessed: Patient Learning Preferences/Education Level/Primary Language Learning Preference: Explanation, Demonstration Highest Education Level: College or Above Preferred Language: English Cognitive Barrier Language Barrier: No Translator Needed: No Memory Deficit: No Emotional Barrier: No Cultural/Religious Beliefs Affecting Medical Care: No Physical Barrier Impaired Vision: Yes Glasses Impaired Hearing: No Decreased Hand dexterity: No Knowledge/Comprehension Knowledge Level: Medium Comprehension Level: Medium Ability to understand written Medium instructions: Ability to understand verbal Medium instructions: Motivation Anxiety Level: Calm Cooperation: Cooperative Education Importance: Acknowledges Need Interest in Health Problems: Asks Questions Perception: Coherent Willingness to Engage in Self- High Management Activities: Readiness to Engage in Self- High Management Activities: Notes Patient cannot stay awake during assessment. Electronic Signature(s) Signed: 08/27/2019 3:52:10 PM By: Gretta Cool, BSN, RN, CWS, Kim RN, BSN Entered By: Gretta Cool, BSN, RN, CWS, Kim on 08/27/2019 14:27:48 [name], [name] [name] ([id number]) -------------------------------------------------------------------------------- Fall Risk Assessment Details Patient Name: Martha Siren. Date of Service: 08/27/2019 2:15 PM Medical Record Number: [id number] Patient Account Number: [id number] Date of Birth/Sex: 1956/01/16 (63 y.o. F) Treating RN: Cornell Barman Primary Care Kaiden Pech: Neta Ehlers Other Clinician: Referring Syvilla Martin: Neta Ehlers Treating Jazon Jipson/Extender: Melburn Hake, HOYT Weeks in Treatment: 0 Fall Risk Assessment Items Have you had 2 or more falls in the last 12 monthso 0 No Have you had any fall that resulted in injury in the last 12 monthso 0 No FALLS RISK SCREEN History of falling - immediate or within 3  months 0 No Secondary diagnosis (Do you have 2 or more medical diagnoseso) 0 No Ambulatory aid None/bed rest/wheelchair/nurse 0 No Crutches/cane/walker 15 Yes Furniture 0 No Intravenous therapy Access/Saline/Heparin Lock 0 No Gait/Transferring Normal/ bed rest/ wheelchair 0 No Weak (short steps with or without shuffle, stooped but able to lift head while 10 Yes walking, may seek support from furniture) Impaired (short steps with shuffle, may have difficulty arising from chair, head 0 No down, impaired balance) Mental Status Oriented to own ability 0 No Electronic Signature(s) Signed: 08/27/2019 3:52:10 PM By: Gretta Cool, BSN, RN, CWS, Kim RN, BSN Entered By: Gretta Cool, BSN, RN, CWS, Kim on 08/27/2019 14:28:24 [place], [name] ([id number]) -------------------------------------------------------------------------------- Foot Assessment Details Patient Name: [name], [name] Date of Service: 08/27/2019 2:15 PM Medical Record Number: [id number] Patient Account Number: [id number] Date of Birth/Sex: March 19, 1956 (63 y.o. F) Treating RN: Cornell Barman Primary Care Glennis Montenegro: Neta Ehlers Other Clinician: Referring Seng Larch: Neta Ehlers Treating Mollye Guinta/Extender: Melburn Hake, HOYT Weeks in Treatment: 0 Foot Assessment Items Site Locations + = Sensation present, - = Sensation absent, C = Callus, U = Ulcer R = Redness, W = Warmth, M = Maceration, PU  = Pre-ulcerative lesion F = Fissure, S = Swelling, D = Dryness Assessment Right: Left: Other Deformity: No No Prior Foot Ulcer: No No Prior Amputation: No No Charcot Joint: No No Ambulatory Status: Ambulatory With Help Assistance Device: Walker GaitEnergy manager) Signed: 08/27/2019 3:52:10 PM By: Gretta Cool, BSN, RN, CWS, Kim RN, BSN Entered By: Gretta Cool, BSN, RN, CWS, Kim on 08/27/2019 14:28:52 [place], [name] ([id number]) -------------------------------------------------------------------------------- Nutrition Risk Screening Details Patient Name: [name], [name] Date of Service: 08/27/2019 2:15 PM Medical Record Number: [id number] Patient Account Number: [id number] Date of Birth/Sex: 14-Nov-1955 (64 y.o. F) Treating RN: Cornell Barman Primary Care Jong Rickman: Neta Ehlers Other Clinician: Referring Marielis Samara: Neta Ehlers Treating Yanely Mast/Extender: Melburn Hake, HOYT Weeks in Treatment: 0 Height (in): Weight (lbs): Body Mass Index (BMI): Nutrition Risk Screening Items Score Screening NUTRITION RISK SCREEN: I have an illness or condition that made me change the kind and/or amount of 0 No food I eat I eat fewer than two meals per day 0 No I eat few fruits and vegetables, or milk products 0 No I have three or more drinks of beer, liquor or wine almost every day 0 No I have tooth or mouth problems that make it hard for me to eat 0 No I don't always have enough money to buy the food I need 0 No I eat alone most of the time 0 No I take three or more different prescribed or over-the-counter drugs a day 0 No Without wanting to, I have lost or gained 10 pounds in the last six months 0 No I am not always physically able to shop, cook and/or feed myself 0 No Nutrition Protocols Good Risk Protocol 0 No interventions needed Moderate Risk Protocol High Risk Proctocol Risk Level: Good Risk Score: 0 Electronic Signature(s) Signed: 08/27/2019 3:52:10 PM By: Gretta Cool, BSN, RN,  CWS, Kim RN, BSN Entered By: Gretta Cool, BSN, RN, CWS, Kim on 08/27/2019 14:28:31

## 2019-08-27 NOTE — Therapy (Signed)
[place] MAIN [facility] SERVICES [address] [place], [place], [address] Phone: [phone]   Fax:  [phone]  Occupational Therapy Treatment  Patient Details  [name] MRN: [id number] Date of Birth: [birth year] Referring Provider (OT): Jacqlyn Larsen , MD   Encounter Date: 08/25/2019  OT End of Session - 08/27/19 1120    Visit Number  15    Number of Visits  36    Date for OT Re-Evaluation  09/30/19    OT Start Time  0332    OT Stop Time  0420    OT Time Calculation (min)  48 min    Activity Tolerance  Patient tolerated treatment well;No increased pain;Other (comment)   Pt limited by somnolence. Progress limited by frequent tardiness   Behavior During Therapy  --   sleepy      Past Medical History:  Diagnosis Date  . Aortic stenosis 03/23/2013   Overview:  02/18/13 TTE EF >55%. Critical AS with mean Ao valve gradient of 82 mm Hg. No AI. No MR, PR, mild TR. Estimated RVSP 30 mm Hg.  . Bicuspid aortic valve   . Edema of both legs 02/20/2017  . 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 Heart failure ([place])   . HTN (hypertension) 03/23/2013  . 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 Morbid obesity ([place]) 02/20/2017  . Sleep apnea     Past Surgical History:  Procedure Laterality Date  . BUBBLE STUDY  06/07/2019   Procedure: BUBBLE STUDY;  Surgeon: Buford Dresser, MD;  Location: [place];  Service: Cardiovascular;;  . CHOLECYSTECTOMY  1983  . RIGHT/LEFT HEART CATH AND CORONARY ANGIOGRAPHY N/A 04/01/2019   Procedure: RIGHT/LEFT HEART CATH AND CORONARY ANGIOGRAPHY;  Surgeon: Lorretta Harp, MD;  Location: [place] CV LAB;  Service: Cardiovascular;  Laterality: N/A;  . TEE WITHOUT CARDIOVERSION N/A 07/01/2018   Procedure: TRANSESOPHAGEAL ECHOCARDIOGRAM (TEE);  Surgeon: Buford Dresser, MD;  Location: [facility] ENDOSCOPY;  Service: Cardiovascular;  Laterality: N/A;  . TEE WITHOUT CARDIOVERSION N/A 06/07/2019   Procedure: TRANSESOPHAGEAL ECHOCARDIOGRAM (TEE);  Surgeon: Buford Dresser, MD;  Location: [facility] ENDOSCOPY;  Service: Cardiovascular;  Laterality: N/A;  . TISSUE AORTIC VALVE REPLACEMENT      There were no vitals filed for this visit.  Subjective Assessment - 08/27/19 1116    Subjective   [name] presents to OT for Rx visit 15/36 to address BLE lymphedema. Pt presents 32 minutes late for a 60 minute treatment session. [name].  Pt presents with BLE unwrapped and lymphorrhea is presnt bilaterally  in addition to superficial skin breakdown.Marland Kitchen Spouse brings non stick pads and ointment  to apply under clean wraps.    Patient is accompanied by:  Family member    Pertinent History  Complex  H&P with several contributing co-morbidities, including CHF, CAD, HTN, Aortic stenosis s/p bioprosthtic valve replacement, severe OSA (bipap), chronic kidney insufficiency, super obesity    Limitations  difficulty walking, impaired functional mobility and transfers, generalized deconditioning, impaired activity tolerance, decreased endurance, AROM limitations at hips, knees and ankles 2/2 body habitus/ girth at joints,                   OT Treatments/Exercises (OP) - 08/27/19 0001      ADLs   ADL Education Given  Yes      Manual Therapy   Manual Therapy  Edema management;Compression Bandaging    Compression Bandaging  BLE short stretch wraps  as established.Marland Kitchen Spouse applied non stick pads and ointment on skin before gradient compression applied.             OT Education - 08/27/19 1120    Education Details  Continued skilled Pt/caregiver education  And LE ADL training throughout visit for lymphedema self care/ home program, including compression wrapping, compression garment and device wear/care, lymphatic pumping ther ex, simple self-MLD, and skin care. Discussed progress towards goals.    Person(s) Educated  Patient;Spouse    Methods   Explanation;Demonstration;Handout    Comprehension  Verbalized understanding;Returned demonstration;Need further instruction          OT Long Term Goals - 08/04/19 1600      OT LONG TERM GOAL #1   Title  Pt will be able to verbalize signs and symptoms of cellulitis infection and identify 4 common lymphedema precautions using printed resource for reference (modified independence) to limit LE progression over time .    Baseline  Max A    Time  4    Period  Days    Status  Partially Met      OT LONG TERM GOAL #2   Title  Pt will apply BLE, knee length, multi-layer, short stretch compression wraps daily using correct gradient techniques with max CG assist    to achieve optimal limb volume reduction, to return affected limb , as closely as possible, to premorbid size and shape, to limit infection risk, and to improve safe functional ambulation and mobility.    Baseline  dependent    Time  4    Period  Days    Status  Achieved      OT LONG TERM GOAL #3   Title  Pt to achieve at least 10% BLE limb volume reductions during Intensive Phase CDT to improve independence and safety with functional performance of basic and instrumental ADLs, functional  mobility and ambulation, and  to limit  infection risk and LE progression.    Baseline  Dependent    Time  12    Period  Weeks    Status  Not Met   LLE below the knee is increased by 33% since starting CDT     OT LONG TERM GOAL #4   Title  Pt will achieve and sustain at least 85% compliance with daily LE self-care home program (skin care, lymphatic pumping therex, compression and simple self-MLD) ) with Max CG assistance during Intensive Phase CDT  to limit  limb swelling, reduce infection risk,  limit associated pain , and limit LE progression.    Baseline  dependent    Time  12    Period  Weeks    Status  Partially Met      OT LONG TERM GOAL #5   Title  Pt will be able to don and doff appropriate compression garments and/ or devices using  correct techniques and assistive devices with Max CG A by issue date  for optimal LE management to limit progression over time.    Baseline  12    Status  On-going      OT LONG TERM GOAL #6   Title  During self-management phase of CDT Pt will retain limb volume reductions achieved during Intensive Phase CDT with Max CG A with no more than 3% volume increase to limit LE progression and further functional decline.    Baseline  dependent    Time  6    Period  Months    Status  On-going            Plan - 08/27/19 1246    Clinical Impression Statement  Treatment session limited by time constraints due to late arival at OT session. Legs are unwrapped, reddened, present with moderate lymphorrhea bilaterally, and superficial skin breakdown is increased in area since last observed. Pt c/o skin tightness at anterior R ankle. Spouse applied low ph castor oil to th8s area in effort to increase hydration and flexibility wheil OT applied BLE short stretch wraps  using established techniques. Pt has wound care consult this Friday. If providers are unable to coordinate care on same day then OT will consider DC to wound care alone until skin issues re resolved. Spouse is adamant that he does not want to return to wound care ctr used previously closer to home as wraps were left in place for full week became wet with lymph drainage and reulted in maceration and further skin breakdown. Pt to be seen next week to discuss POC.       Patient will benefit from skilled therapeutic intervention in order to improve the following deficits and impairments:           Visit Diagnosis: Lymphedema, not elsewhere classified    Problem List Patient Active Problem List   Diagnosis Date Noted  . Long term (current) use of anticoagulants 06/11/2019  . TIA (transient ischemic attack) 06/04/2019  . Transient neurologic deficit 06/03/2019  . Anemia 06/03/2019  . Chronic diastolic CHF (congestive heart failure) ([place])  06/03/2019  . CAD (coronary artery disease) 06/03/2019  . Essential hypertension 06/03/2019  . Sleep apnea 09/25/2018  . At risk for obstructive sleep apnea 08/14/2018  . Obstructive hypertrophic cardiomyopathy ([place]) 07/10/2018  . Dysfunctional uterine bleeding 04/22/2018  . Edema of both legs 02/20/2017  . 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 Morbid obesity ([place]) 02/20/2017  . Aortic stenosis 03/23/2013  . Hypertensive heart disease with heart failure ([place]) 03/23/2013    Andrey Spearman, MS, OTR/L, [facility] 08/27/19 12:53 PM'   [place] MAIN [facility] SERVICES [address] [place], [place], [address] Phone: [phone]   Fax:  [phone]  [name] MRN: [id number] Date of Birth: Jan 11, 1956

## 2019-08-30 ENCOUNTER — Encounter: Payer: BC Managed Care – PPO | Admitting: Occupational Therapy

## 2019-08-31 ENCOUNTER — Other Ambulatory Visit: Payer: Self-pay

## 2019-08-31 ENCOUNTER — Encounter: Payer: BC Managed Care – PPO | Admitting: Physician Assistant

## 2019-08-31 DIAGNOSIS — I890 Lymphedema, not elsewhere classified: Secondary | ICD-10-CM | POA: Diagnosis not present

## 2019-08-31 NOTE — Progress Notes (Signed)
[name], [name] ([id number]) Visit Report for 08/31/2019 Chief Complaint Document Details Patient Name: [name], [name]. Date of Service: 08/31/2019 9:15 AM Medical Record Number: [id number] Patient Account Number: [id number] Date of Birth/Sex: 1956-07-28 (63 y.o. F) Treating RN: Montey Hora Primary Care Provider: Neta Ehlers Other Clinician: Referring Provider: Neta Ehlers Treating Provider/Extender: Melburn Hake, HOYT Weeks in Treatment: 0 Information Obtained from: Patient Chief Complaint Bilateral LE Lymphedema and ulcers Electronic Signature(s) Signed: 08/31/2019 9:34:39 AM By: Worthy Keeler PA-C Entered By: Worthy Keeler on 08/31/2019 09:34:38 [place], [name] ([id number]) -------------------------------------------------------------------------------- Physician Orders Details Patient Name: [name]. Date of Service: 08/31/2019 9:15 AM Medical Record Number: [id number] Patient Account Number: [id number] Date of Birth/Sex: 02-08-1956 (63 y.o. F) Treating RN: Montey Hora Primary Care Provider: Neta Ehlers Other Clinician: Referring Provider: Neta Ehlers Treating Provider/Extender: Melburn Hake, HOYT Weeks in Treatment: 0 Verbal / Phone Orders: No Diagnosis Coding ICD-10 Coding Code Description I89.0 Lymphedema, not elsewhere classified L97.812 Non-pressure chronic ulcer of other part of right lower leg with fat layer exposed L97.822 Non-pressure chronic ulcer of other part of left lower leg with fat layer exposed I10 Essential (primary) hypertension Z79.01 Long term (current) use of anticoagulants Wound Cleansing Wound #1 Left,Circumferential Lower Leg o Cleanse wound with mild soap and water o May shower with protection. - Do not get your wraps wet Wound #2 Right,Circumferential Lower Leg o Cleanse wound with mild soap and water o May shower with protection. - Do not get your wraps wet Primary Wound Dressing Wound #1  Left,Circumferential Lower Leg o XtraSorb Wound #2 Right,Circumferential Lower Leg o Silver Alginate Secondary Dressing Wound #1 Left,Circumferential Lower Leg o ABD pad Wound #2 Right,Circumferential Lower Leg o ABD pad o XtraSorb Dressing Change Frequency o Change Dressing Monday, Wednesday, Friday Follow-up Appointments o Return Appointment in 1 week. Edema Control Wound #1 Left,Circumferential Lower Leg o 4 Layer Compression System - Bilateral - anchor with unna Wound #2 Right,Circumferential Lower Leg [name], [name] o 4 Layer Compression System - Bilateral - anchor with unna Home Health Wound #1 Left,Circumferential Lower Leg o Continue Home Health Visits - [place] Nurse may visit PRN to address patientos wound care needs. o FACE TO FACE ENCOUNTER: MEDICARE and MEDICAID PATIENTS: I certify that this patient is under my care and that I had a face-to-face encounter that meets the physician face-to-face encounter requirements with this patient on this date. The encounter with the patient was in whole or in part for the following MEDICAL CONDITION: (primary reason for [place]) MEDICAL NECESSITY: I certify, that based on my findings, NURSING services are a medically necessary home health service. HOME BOUND STATUS: I certify that my clinical findings support that this patient is homebound (i.e., Due to illness or injury, pt requires aid of supportive devices such as crutches, cane, wheelchairs, walkers, the use of special transportation or the assistance of another person to leave their place of residence. There is a normal inability to leave the home and doing so requires considerable and taxing effort. Other absences are for medical reasons / religious services and are infrequent or of short duration when for other reasons). o If current dressing causes regression in wound condition, may D/C ordered dressing product/s  and apply Normal Saline Moist Dressing daily until next [place] / Other MD appointment. [place] of regression in wound condition at [phone]. o Please direct any NON-WOUND related issues/requests for orders to  patient's Primary Care Physician Wound #2 Right,Circumferential Lower Leg o [place] Visits - [place] Nurse may visit PRN to address patientos wound care needs. o FACE TO FACE ENCOUNTER: MEDICARE and MEDICAID PATIENTS: I certify that this patient is under my care and that I had a face-to-face encounter that meets the physician face-to-face encounter requirements with this patient on this date. The encounter with the patient was in whole or in part for the following MEDICAL CONDITION: (primary reason for [place]) MEDICAL NECESSITY: I certify, that based on my findings, NURSING services are a medically necessary home health service. HOME BOUND STATUS: I certify that my clinical findings support that this patient is homebound (i.e., Due to illness or injury, pt requires aid of supportive devices such as crutches, cane, wheelchairs, walkers, the use of special transportation or the assistance of another person to leave their place of residence. There is a normal inability to leave the home and doing so requires considerable and taxing effort. Other absences are for medical reasons / religious services and are infrequent or of short duration when for other reasons). o If current dressing causes regression in wound condition, may D/C ordered dressing product/s and apply Normal Saline Moist Dressing daily until next [place] / Other MD appointment. [place] of regression in wound condition at [phone]. o Please direct any NON-WOUND related issues/requests for orders to patient's Primary Care Physician Electronic Signature(s) Unsigned Entered By: Montey Hora on 08/31/2019  09:50:39 Signature(s): Date(s): [place], [name] ([id number]) -------------------------------------------------------------------------------- Problem List Details Patient Name: [name], [name]. Date of Service: 08/31/2019 9:15 AM Medical Record Number: [id number] Patient Account Number: [id number] Date of Birth/Sex: 21-Dec-1955 (63 y.o. 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 Date of Service: 08/31/2019 Medical Record Number: [id number] Patient Account Number: [id number] Date of Birth/Sex: [birth year] (63 y.o. F) Treating RN: Montey Hora Primary Care Provider: Neta Ehlers Other Clinician: Referring Provider: Neta Ehlers Treating Provider/Extender: Melburn Hake, HOYT Weeks in Treatment: 0 Diagnosis Coding ICD-10 Codes Code Description I89.0 Lymphedema, not elsewhere classified L97.812 Non-pressure chronic ulcer of other part of right lower leg with fat layer exposed L97.822 Non-pressure chronic ulcer of other part of left  lower leg with fat layer exposed [place] (primary) hypertension Z79.01 Long term (current) use of anticoagulants Facility Procedures CPT4: Description Modifier Quantity Code [id number] BILATERAL: Application of multi-layer venous compression system; leg (below 1 knee), including ankle and foot. Electronic Signature(s) Unsigned Entered By: Montey Hora on 08/31/2019 09:51:09 Signature(s): Date(s):

## 2019-09-01 ENCOUNTER — Ambulatory Visit: Payer: BC Managed Care – PPO | Admitting: Occupational Therapy

## 2019-09-06 ENCOUNTER — Other Ambulatory Visit: Payer: Self-pay

## 2019-09-06 ENCOUNTER — Encounter: Payer: BC Managed Care – PPO | Admitting: Internal Medicine

## 2019-09-06 DIAGNOSIS — I890 Lymphedema, not elsewhere classified: Secondary | ICD-10-CM | POA: Diagnosis not present

## 2019-09-06 NOTE — Progress Notes (Signed)
SHYREE, PIECZYNSKI ([id number]) Visit Report for 09/06/2019 HPI Details Patient Name: [name], [name]. Date of Service: 09/06/2019 2:45 PM Medical Record Number: [id number] Patient Account Number: [id number] Date of Birth/Sex: [birth year] (63 y.o. F) Treating RN: Harold Barban Primary Care Provider: Neta Ehlers Other Clinician: Referring Provider: Neta Ehlers Treating Provider/Extender: Beverly Gust in Treatment: 1 History of Present Illness HPI Description: 08/27/2019 patient presents today for initial evaluation here in our clinic concerning issues that she has been having with her bilateral lower extremities with lymphedema and weeping. She is previously been a patient over the past year of Dr. Jerilee Hoh at the wound care center in [facility]. With that being said she subsequently was recommended by the lymphedema clinic here in [place] to follow-up with [place] here instead of going back to [place] since they were in closer contact with the clinic here than they are obviously. With that being said the patient states that is why she came her way in order to be evaluated at this point. Fortunately there is no signs of active infection at this time. No fevers, chills, nausea, vomiting, or diarrhea. The patient does have a history of lymphedema, hypertension, long-term use of anticoagulant therapy, and obstructive sleep apnea. During the visit today and from checking into the discharge she was prone to unexpectedly falling asleep. I did discuss this with her daughter as well who states that this is very common for her she states she is not really sure how compliant her mother is with her CPAP machine that may be part of the issue. The patient did have ABIs performed which revealed have copies of his well today that was on 05/01/2019 and it showed a right ABI of 1.17 with a TBI of 1.12 and a left ABI of 1.24 with a TBI of 0.96. She has had 2 transfusions in the past 2  weeks apparently as well. 08/31/2019 upon evaluation today patient actually appears to be doing somewhat better in regard to her lower extremity ulcers. She still has a tremendous amount of drainage from the left lower extremity unfortunately but her right lower extremity is doing much better which is great news. Overall I am very pleased in this regard. With regard to home health we finally got things settled so home health will be coming out to see her this would be very beneficial in my opinion as I think they will be able to help her tremendously with more frequent dressing changes. 12/28-Patient is back here at 1 week visit, with regard to her bilateral lower extremity ulcers, she has a significant degree of lymphedema, significant degree of drainage more from the left and which continues to be an issue, patient had not been using her lymphedema pumps as recommended, she is concerned about amount of drainage and how quickly it soaks the dressing, she is concerned about the lymphedema pump sticking to her skin and was told to use it on the wraps. She will get in touch with her cardiologist about adjusting her Lasix dose. Electronic Signature(s) Signed: 09/06/2019 4:00:00 PM By: Tobi Bastos MD, MBA Entered By: Tobi Bastos on 09/06/2019 15:59:59 [place], [name] ([id number]) -------------------------------------------------------------------------------- Physical Exam Details Patient Name: [name], [name] Date of Service: 09/06/2019 2:45 PM Medical Record Number: [id number] Patient Account Number: [id number] Date of Birth/Sex: Jan 31, 1956 (63 y.o. F) Treating RN: Harold Barban Primary Care Provider: Neta Ehlers Other Clinician: Referring Provider: Neta Ehlers Treating Provider/Extender: Beverly Gust in Treatment: 1 Constitutional alert  and oriented x 3. sitting or standing blood pressure is within target range for patient.. supine blood pressure is  within target range for patient.. pulse regular and within target range for patient.Marland Kitchen respirations regular, non-labored and within target range for patient.Marland Kitchen temperature within target range for patient.. . . Well-nourished and well-hydrated in no acute distress. Notes Significant lymphedema on both legs, left leg has greater surface area of ulceration, appears to be slightly bigger than the right, and appears to have definitely more drainage Electronic Signature(s) Signed: 09/06/2019 4:00:55 PM By: Tobi Bastos MD, MBA Entered By: Tobi Bastos on 09/06/2019 [place], [name] ([id number]) -------------------------------------------------------------------------------- Physician Orders Details Patient Name: [name] Date of Service: 09/06/2019 2:45 PM Medical Record Number: [id number] Patient Account Number: [id number] Date of Birth/Sex: July 02, 1956 (63 y.o. F) Treating RN: Harold Barban Primary Care Provider: Neta Ehlers Other Clinician: Referring Provider: Neta Ehlers Treating Provider/Extender: Beverly Gust in Treatment: 1 Verbal / Phone Orders: No Diagnosis Coding Wound Cleansing Wound #1 Left,Circumferential Lower Leg o Cleanse wound with mild soap and water o May shower with protection. - Do not get your wraps wet Wound #2 Right,Circumferential Lower Leg o Cleanse wound with mild soap and water o May shower with protection. - Do not get your wraps wet Primary Wound Dressing Wound #1 Left,Circumferential Lower Leg o XtraSorb Wound #2 Right,Circumferential Lower Leg o XtraSorb Secondary Dressing Wound #1 Left,Circumferential Lower Leg o ABD pad Wound #2 Right,Circumferential Lower Leg o ABD pad Dressing Change Frequency Wound #1 Left,Circumferential Lower Leg o Change Dressing Monday, Wednesday, Friday Wound #2 Right,Circumferential Lower Leg o Change Dressing Monday, Wednesday, Friday Follow-up  Appointments Wound #1 Left,Circumferential Lower Leg o Return Appointment in 1 week. Wound #2 Right,Circumferential Lower Leg o Return Appointment in 1 week. Edema Control Wound #1 Left,Circumferential Lower Leg o 4 Layer Compression System - Bilateral - anchor with unna Wound #2 Right,Circumferential Lower Leg o 4 Layer Compression System - Bilateral - anchor with HAILEYANN, GUNNETT ([id number]) Home Health Wound #1 Left,Circumferential Lower Leg o [place] Visits - [place] Nurse may visit PRN to address patientos wound care needs. o FACE TO FACE ENCOUNTER: MEDICARE and MEDICAID PATIENTS: I certify that this patient is under my care and that I had a face-to-face encounter that meets the physician face-to-face encounter requirements with this patient on this date. The encounter with the patient was in whole or in part for the following MEDICAL CONDITION: (primary reason for [place]) MEDICAL NECESSITY: I certify, that based on my findings, NURSING services are a medically necessary home health service. HOME BOUND STATUS: I certify that my clinical findings support that this patient is homebound (i.e., Due to illness or injury, pt requires aid of supportive devices such as crutches, cane, wheelchairs, walkers, the use of special transportation or the assistance of another person to leave their place of residence. There is a normal inability to leave the home and doing so requires considerable and taxing effort. Other absences are for medical reasons / religious services and are infrequent or of short duration when for other reasons). o If current dressing causes regression in wound condition, may D/C ordered dressing product/s and apply Normal Saline Moist Dressing daily until next [place] / Other MD appointment. [place] of regression in wound condition at [phone]. o Please direct any NON-WOUND  related issues/requests for orders to patient's Primary Care Physician Wound #2 Right,Circumferential Lower Leg o Continue Home  Health Visits - [place] Nurse may visit PRN to address patientos wound care needs. o FACE TO FACE ENCOUNTER: MEDICARE and MEDICAID PATIENTS: I certify that this patient is under my care and that I had a face-to-face encounter that meets the physician face-to-face encounter requirements with this patient on this date. The encounter with the patient was in whole or in part for the following MEDICAL CONDITION: (primary reason for [place]) MEDICAL NECESSITY: I certify, that based on my findings, NURSING services are a medically necessary home health service. HOME BOUND STATUS: I certify that my clinical findings support that this patient is homebound (i.e., Due to illness or injury, pt requires aid of supportive devices such as crutches, cane, wheelchairs, walkers, the use of special transportation or the assistance of another person to leave their place of residence. There is a normal inability to leave the home and doing so requires considerable and taxing effort. Other absences are for medical reasons / religious services and are infrequent or of short duration when for other reasons). o If current dressing causes regression in wound condition, may D/C ordered dressing product/s and apply Normal Saline Moist Dressing daily until next [place] / Other MD appointment. [place] of regression in wound condition at [phone]. o Please direct any NON-WOUND related issues/requests for orders to patient's Primary Care Physician Electronic Signature(s) Signed: 09/06/2019 4:23:20 PM By: Harold Barban Signed: 09/06/2019 4:26:33 PM By: Tobi Bastos MD, MBA Entered By: Harold Barban on 09/06/2019 15:56:33 [place], [name]  ([id number]) -------------------------------------------------------------------------------- Progress Note Details Patient Name: [name], [name] Date of Service: 09/06/2019 2:45 PM Medical Record Number: [id number] Patient Account Number: [id number] Date of Birth/Sex: 02-24-1956 (63 y.o. F) Treating RN: Harold Barban Primary Care Provider: Neta Ehlers Other Clinician: Referring Provider: Neta Ehlers Treating Provider/Extender: Beverly Gust in Treatment: 1 Subjective History of Present Illness (HPI) 08/27/2019 patient presents today for initial evaluation here in our clinic concerning issues that she has been having with her bilateral lower extremities with lymphedema and weeping. She is previously been a patient over the past year of Dr. Jerilee Hoh at the wound care center in [facility]. With that being said she subsequently was recommended by the lymphedema clinic here in [place] to follow-up with [place] here instead of going back to [place] since they were in closer contact with the clinic here than they are obviously. With that being said the patient states that is why she came her way in order to be evaluated at this point. Fortunately there is no signs of active infection at this time. No fevers, chills, nausea, vomiting, or diarrhea. The patient does have a history of lymphedema, hypertension, long-term use of anticoagulant therapy, and obstructive sleep apnea. During the visit today and from checking into the discharge she was prone to unexpectedly falling asleep. I did discuss this with her daughter as well who states that this is very common for her she states she is not really sure how compliant her mother is with her CPAP machine that may be part of the issue. The patient did have ABIs performed which revealed have copies of his well today that was on 05/01/2019 and it showed a right ABI of 1.17 with a TBI of 1.12 and a left ABI of 1.24 with a TBI of  0.96. She has had 2 transfusions in the past 2 weeks apparently as well. 08/31/2019 upon evaluation today patient actually appears to be doing somewhat better  in regard to her lower extremity ulcers. She still has a tremendous amount of drainage from the left lower extremity unfortunately but her right lower extremity is doing much better which is great news. Overall I am very pleased in this regard. With regard to home health we finally got things settled so home health will be coming out to see her this would be very beneficial in my opinion as I think they will be able to help her tremendously with more frequent dressing changes. 12/28-Patient is back here at 1 week visit, with regard to her bilateral lower extremity ulcers, she has a significant degree of lymphedema, significant degree of drainage more from the left and which continues to be an issue, patient had not been using her lymphedema pumps as recommended, she is concerned about amount of drainage and how quickly it soaks the dressing, she is concerned about the lymphedema pump sticking to her skin and was told to use it on the wraps. She will get in touch with her cardiologist about adjusting her Lasix dose. Objective Constitutional alert and oriented x 3. sitting or standing blood pressure is within target range for patient.. supine blood pressure is within target range for patient.. pulse regular and within target range for patient.Marland Kitchen respirations regular, non-labored and within target range for patient.Marland Kitchen temperature within target range for patient.. Well-nourished and well-hydrated in no acute distress. Vitals Time Taken: 3:10 PM, Height: 62 in, Weight: 290 lbs, BMI: 53, Temperature: 99.3 F, Pulse: 103 bpm, Respiratory Rate: 20 breaths/min, Blood Pressure: 148/72 mmHg. General Notes: Significant lymphedema on both legs, left leg has greater surface area of ulceration, appears to be slightly Ahmad, [place] ([id number]) bigger  than the right, and appears to have definitely more drainage Integumentary (Hair, Skin) Wound #1 status is Open. Original cause of wound was Gradually Appeared. The wound is located on the Left,Circumferential Lower Leg. The wound measures 18cm length x 56cm width x 0.2cm depth; 791.681cm^2 area and 158.336cm^3 volume. There is Fat Layer (Subcutaneous Tissue) Exposed exposed. There is no tunneling or undermining noted. There is a large amount of serous drainage noted. The wound margin is indistinct and nonvisible. There is medium (34-66%) pink granulation within the wound bed. There is a medium (34-66%) amount of necrotic tissue within the wound bed including Adherent Slough. General Notes: copiuos drainage Wound #2 status is Open. Original cause of wound was Gradually Appeared. The wound is located on the Right,Circumferential Lower Leg. The wound measures 17cm length x 41cm width x 0.1cm depth; 547.423cm^2 area and 54.742cm^3 volume. There is Fat Layer (Subcutaneous Tissue) Exposed exposed. There is no tunneling or undermining noted. There is a large amount of serous drainage noted. The wound margin is flat and intact. There is medium (34-66%) pink granulation within the wound bed. There is a medium (34-66%) amount of necrotic tissue within the wound bed including Adherent Slough. General Notes: copious drainage Plan Wound Cleansing: Wound #1 Left,Circumferential Lower Leg: Cleanse wound with mild soap and water May shower with protection. - Do not get your wraps wet Wound #2 Right,Circumferential Lower Leg: Cleanse wound with mild soap and water May shower with protection. - Do not get your wraps wet Primary Wound Dressing: Wound #1 Left,Circumferential Lower Leg: XtraSorb Wound #2 Right,Circumferential Lower Leg: XtraSorb Secondary Dressing: Wound #1 Left,Circumferential Lower Leg: ABD pad Wound #2 Right,Circumferential Lower Leg: ABD pad Dressing Change Frequency: Wound #1  Left,Circumferential Lower Leg: Change Dressing Monday, Wednesday, Friday Wound #2 Right,Circumferential Lower Leg: Change Dressing  Monday, Wednesday, Friday Follow-up Appointments: Wound #1 Left,Circumferential Lower Leg: Return Appointment in 1 week. Wound #2 Right,Circumferential Lower Leg: Return Appointment in 1 week. Edema Control: Wound #1 Left,Circumferential Lower Leg: 4 Layer Compression System - Bilateral - anchor with unna Wound #2 Right,Circumferential Lower Leg: 4 Layer Compression System - Bilateral - anchor with unna Home Health: Wound #1 Left,Circumferential Lower Leg: KIMESHA, BIRCHETT ([id number]) [place] Visits - [place] Nurse may visit PRN to address patient s wound care needs. FACE TO FACE ENCOUNTER: MEDICARE and MEDICAID PATIENTS: I certify that this patient is under my care and that I had a face-to-face encounter that meets the physician face-to-face encounter requirements with this patient on this date. The encounter with the patient was in whole or in part for the following MEDICAL CONDITION: (primary reason for [place]) MEDICAL NECESSITY: I certify, that based on my findings, NURSING services are a medically necessary home health service. HOME BOUND STATUS: I certify that my clinical findings support that this patient is homebound (i.e., Due to illness or injury, pt requires aid of supportive devices such as crutches, cane, wheelchairs, walkers, the use of special transportation or the assistance of another person to leave their place of residence. There is a normal inability to leave the home and doing so requires considerable and taxing effort. Other absences are for medical reasons / religious services and are infrequent or of short duration when for other reasons). If current dressing causes regression in wound condition, may D/C ordered dressing product/s and apply Normal Saline Moist Dressing daily until next [place] / Other MD appointment. [place] of regression in wound condition at [phone]. Please direct any NON-WOUND related issues/requests for orders to patient's Primary Care Physician Wound #2 Right,Circumferential Lower Leg: [place] Visits - [place] Nurse may visit PRN to address patient s wound care needs. FACE TO FACE ENCOUNTER: MEDICARE and MEDICAID PATIENTS: I certify that this patient is under my care and that I had a face-to-face encounter that meets the physician face-to-face encounter requirements with this patient on this date. The encounter with the patient was in whole or in part for the following MEDICAL CONDITION: (primary reason for [place]) MEDICAL NECESSITY: I certify, that based on my findings, NURSING services are a medically necessary home health service. HOME BOUND STATUS: I certify that my clinical findings support that this patient is homebound (i.e., Due to illness or injury, pt requires aid of supportive devices such as crutches, cane, wheelchairs, walkers, the use of special transportation or the assistance of another person to leave their place of residence. There is a normal inability to leave the home and doing so requires considerable and taxing effort. Other absences are for medical reasons / religious services and are infrequent or of short duration when for other reasons). If current dressing causes regression in wound condition, may D/C ordered dressing product/s and apply Normal Saline Moist Dressing daily until next [place] / Other MD appointment. [place] of regression in wound condition at [phone]. Please direct any NON-WOUND related issues/requests for orders to patient's Primary Care Physician 1. Continue using extra sorb along with 4 layer compression wrap for both legs, home health visits to be continuing for changing dressings 2. Patient has been reinforced to  use the lymphedema pumps as recommended twice daily 3. Return to clinic next week Electronic Signature(s) Signed: 09/06/2019 4:02:30 PM By: Tobi Bastos MD, MBA Entered  By: Tobi Bastos on 09/06/2019 16:02:29 [place], [name] ([id number]) -------------------------------------------------------------------------------- SuperBill Details Patient Name: [name]. Date of Service: 09/06/2019 Medical Record Number: [id number] Patient Account Number: [id number] Date of Birth/Sex: 1955-11-24 (63 y.o. 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.822 Non-pressure chronic ulcer of other part of left lower leg with fat layer exposed [place] (primary) hypertension Z79.01 Long term (current) use of anticoagulants Facility Procedures CPT4 Code: [id number] Description: 99214 - WOUND CARE VISIT-LEV 4 EST PT Modifier: Quantity: 1 Physician Procedures CPT4 Code Description: [id number] - WC PHYS LEVEL 4 - EST PT ICD-10 Diagnosis Description L97.822 Non-pressure chronic ulcer of other part of left lower leg wit Modifier: h fat layer expos Quantity: 1 ed Electronic Signature(s) Signed: 09/06/2019 4:02:50 PM By: Tobi Bastos MD, MBA Entered By: Tobi Bastos on 09/06/2019 16:02:50

## 2019-09-06 NOTE — Progress Notes (Signed)
LONA, SNIFFEN ([id number]) Visit Report for 09/06/2019 Arrival Information Details Patient Name: [name], [name]. Date of Service: 09/06/2019 2:45 PM Medical Record Number: [id number] Patient Account Number: [id number] Date of Birth/Sex: 12/23/1955 (63 y.o. F) Treating RN: Harold Barban Primary Care Marasia Newhall: Neta Ehlers Other Clinician: Referring Ahmari Garton: Neta Ehlers Treating Lakie Mclouth/Extender: Beverly Gust in Treatment: 1 Visit Information History Since Last Visit Added or deleted any medications: No Patient Arrived: Ambulatory Any new allergies or adverse reactions: No Arrival Time: 15:12 Had a fall or experienced change in No Accompanied By: husband activities of daily living that may affect Transfer Assistance: None risk of falls: Patient Identification Verified: Yes Signs or symptoms of abuse/neglect since last visito No Secondary Verification Process Yes Hospitalized since last visit: No Completed: Implantable device outside of the clinic excluding No Patient Has Alerts: Yes cellular tissue based products placed in the center Patient Alerts: Patient on Blood since last visit: Thinner Has Dressing in Place as Prescribed: No warfarin Pain Present Now: Yes Not Diabetic 04/29/2019 AVVS ABI R)1.17 L)1.24 TBI R) 1.12 L) 0.96 Electronic Signature(s) Signed: 09/06/2019 4:12:23 PM By: Lorine Bears RCP, RRT, CHT Entered By: Lorine Bears on 09/06/2019 15:13:03 Fede, [name] ([id number]) -------------------------------------------------------------------------------- Clinic Level of Care Assessment Details Patient Name: Gordon, [name]. Date of Service: 09/06/2019 2:45 PM Medical Record Number: [id number] Patient Account Number: [id number] Date of Birth/Sex: 01-04-1956 (63 y.o. F) Treating RN: Harold Barban Primary Care Sanuel Ladnier: Neta Ehlers Other Clinician: Referring Diya Gervasi: Neta Ehlers Treating  Nessie Nong/Extender: Beverly Gust in Treatment: 1 Clinic Level of Care Assessment Items TOOL 4 Quantity Score []  - Use when only an EandM is performed on FOLLOW-UP visit 0 ASSESSMENTS - Nursing Assessment / Reassessment X - Reassessment of Co-morbidities (includes updates in patient status) 1 10 X- 1 5 Reassessment of Adherence to Treatment Plan ASSESSMENTS - Wound and Skin Assessment / Reassessment []  - Simple Wound Assessment / Reassessment - one wound 0 X- 3 5 Complex Wound Assessment / Reassessment - multiple wounds []  - 0 Dermatologic / Skin Assessment (not related to wound area) ASSESSMENTS - Focused Assessment X - Circumferential Edema Measurements - multi extremities 2 5 []  - 0 Nutritional Assessment / Counseling / Intervention []  - 0 Lower Extremity Assessment (monofilament, tuning fork, pulses) []  - 0 Peripheral Arterial Disease Assessment (using hand held doppler) ASSESSMENTS - Ostomy and/or Continence Assessment and Care []  - Incontinence Assessment and Management 0 []  - 0 Ostomy Care Assessment and Management (repouching, etc.) PROCESS - Coordination of Care X - Simple Patient / Family Education for ongoing care 1 15 []  - 0 Complex (extensive) Patient / Family Education for ongoing care []  - 0 Staff obtains Programmer, systems, Records, Test Results / Process Orders X- 1 10 Staff telephones HHA, Nursing Homes / Clarify orders / etc []  - 0 Routine Transfer to another Facility (non-emergent condition) []  - 0 Routine Hospital Admission (non-emergent condition) []  - 0 New Admissions / Biomedical engineer / Ordering NPWT, Apligraf, etc. []  - 0 Emergency Hospital Admission (emergent condition) X- 1 10 Simple Discharge Coordination Decoteau, Stayce L. ([id number]) []  - 0 Complex (extensive) Discharge Coordination PROCESS - Special Needs []  - Pediatric / Minor Patient Management 0 []  - 0 Isolation Patient Management []  - 0 Hearing / Language / Visual special  needs []  - 0 Assessment of Community assistance (transportation, D/C planning, etc.) []  - 0 Additional assistance / Altered mentation []  - 0 Support Surface(s) Assessment (bed, cushion, seat, etc.)  INTERVENTIONS - Wound Cleansing / Measurement []  - Simple Wound Cleansing - one wound 0 X- 3 5 Complex Wound Cleansing - multiple wounds X- 1 5 Wound Imaging (photographs - any number of wounds) []  - 0 Wound Tracing (instead of photographs) []  - 0 Simple Wound Measurement - one wound X- 3 5 Complex Wound Measurement - multiple wounds INTERVENTIONS - Wound Dressings X - Small Wound Dressing one or multiple wounds 3 10 []  - 0 Medium Wound Dressing one or multiple wounds []  - 0 Large Wound Dressing one or multiple wounds []  - 0 Application of Medications - topical []  - 0 Application of Medications - injection INTERVENTIONS - Miscellaneous []  - External ear exam 0 []  - 0 Specimen Collection (cultures, biopsies, blood, body fluids, etc.) []  - 0 Specimen(s) / Culture(s) sent or taken to Lab for analysis []  - 0 Patient Transfer (multiple staff / Civil Service fast streamer / Similar devices) []  - 0 Simple Staple / Suture removal (25 or less) []  - 0 Complex Staple / Suture removal (26 or more) []  - 0 Hypo / Hyperglycemic Management (close monitor of Blood Glucose) []  - 0 Ankle / Brachial Index (ABI) - do not check if billed separately X- 1 5 Vital Signs Younkin, [place]. ([id number]) Has the patient been seen at the hospital within the last three years: Yes Total Score: 145 Level Of Care: New/Established - Level 4 Electronic Signature(s) Signed: 09/06/2019 4:23:20 PM By: Harold Barban Entered By: Harold Barban on 09/06/2019 15:53:36 Simko, Kay ([id number]) -------------------------------------------------------------------------------- Lower Extremity Assessment Details Patient Name: [name] Date of Service: 09/06/2019 2:45 PM Medical Record Number: [id number] Patient  Account Number: [id number] Date of Birth/Sex: [birth year] (63 y.o. F) Treating RN: Montey Hora Primary Care Teigan Sahli: Neta Ehlers Other Clinician: Referring Jobina Maita: Neta Ehlers Treating Nil Bolser/Extender: Beverly Gust in Treatment: 1 Edema Assessment Assessed: [Left: No] [Right: No] Edema: [Left: Yes] [Right: Yes] Calf Left: Right: Point of Measurement: 32 cm From Medial Instep 61 cm 55 cm Ankle Left: Right: Point of Measurement: 10 cm From Medial Instep 32 cm 30.5 cm Vascular Assessment Pulses: Dorsalis Pedis Palpable: [Left:Yes] [Right:Yes] Electronic Signature(s) Signed: 09/06/2019 4:30:26 PM By: Montey Hora Entered By: Montey Hora on 09/06/2019 15:23:02 Shinall, Hazen L. ([id number]) -------------------------------------------------------------------------------- Multi Wound Chart Details Patient Name: [name] Date of Service: 09/06/2019 2:45 PM Medical Record Number: [id number] Patient Account Number: [id number] Date of Birth/Sex: [birth year] (63 y.o. F) Treating RN: Harold Barban Primary Care Chanse Kagel: Neta Ehlers Other Clinician: Referring Zhana Jeangilles: Neta Ehlers Treating Sirenia Whitis/Extender: Beverly Gust in Treatment: 1 Vital Signs Height(in): 62 Pulse(bpm): 103 Weight(lbs): 290 Blood Pressure(mmHg): 148/72 Body Mass Index(BMI): 53 Temperature(F): 99.3 Respiratory Rate 20 (breaths/min): Photos: [N/A:N/A] Wound Location: Left Lower Leg - Right Lower Leg - N/A Circumferential Circumferential Wounding Event: Gradually Appeared Gradually Appeared N/A Primary Etiology: Lymphedema Lymphedema N/A Comorbid History: Anemia, Coronary Artery Anemia, Coronary Artery N/A Disease, Hypertension Disease, Hypertension Date Acquired: 08/04/2019 08/04/2019 N/A Weeks of Treatment: 1 1 N/A Wound Status: Open Open N/A Measurements L x W x D 18x56x0.2 17x41x0.1 N/A (cm) Area (cm) : 791.681 547.423 N/A Volume (cm) : 158.336  54.742 N/A % Reduction in Area: 23.10% -93.60% N/A % Reduction in Volume: -53.80% -93.60% N/A Classification: Full Thickness Without Partial Thickness N/A Exposed Support Structures Exudate Amount: Large Large N/A Exudate Type: Serous Serous N/A Exudate Color: amber amber N/A Wound Margin: Indistinct, nonvisible Flat and Intact N/A Granulation Amount: Medium (34-66%) Medium (34-66%) N/A Granulation Quality: Pink  Pink N/A Necrotic Amount: Medium (34-66%) Medium (34-66%) N/A Exposed Structures: Fat Layer (Subcutaneous Fat Layer (Subcutaneous N/A Tissue) Exposed: Yes Tissue) Exposed: Yes Fascia: No Fascia: No Tendon: No Tendon: No Muscle: No Muscle: No Gomez, Michala L. ([id number]) Joint: No Joint: No Bone: No Bone: No Epithelialization: Small (1-33%) Medium (34-66%) N/A Assessment Notes: copiuos drainage copious drainage N/A Treatment Notes Electronic Signature(s) Signed: 09/06/2019 4:23:20 PM By: Harold Barban Entered By: Harold Barban on 09/06/2019 15:51:23 Downey, [name] ([id number]) -------------------------------------------------------------------------------- [place] Details Patient Name: [name]. Date of Service: 09/06/2019 2:45 PM Medical Record Number: [id number] Patient Account Number: [id number] Date of Birth/Sex: 1956-02-09 (63 y.o. F) Treating RN: Harold Barban Primary Care Rowan Pollman: Neta Ehlers Other Clinician: Referring Kazandra Forstrom: Neta Ehlers Treating Keymani Mclean/Extender: Beverly Gust in Treatment: 1 Active Inactive Abuse / Safety / Falls / Self Care Management Nursing Diagnoses: Potential for falls Goals: Patient will not experience any injury related to falls Date Initiated: 08/27/2019 Target Resolution Date: 11/20/2019 Goal Status: Active Interventions: Assess fall risk on admission and as needed Notes: Orientation to the Wound Care Program Nursing Diagnoses: Knowledge deficit related to the  wound healing center program Goals: Patient/caregiver will verbalize understanding of the [place] Program Date Initiated: 08/27/2019 Target Resolution Date: 11/20/2019 Goal Status: Active Interventions: Provide education on orientation to the wound center Notes: Venous Leg Ulcer Nursing Diagnoses: Potential for venous Insuffiency (use before diagnosis confirmed) Goals: Patient will maintain optimal edema control Date Initiated: 08/27/2019 Target Resolution Date: 11/20/2019 Goal Status: Active Interventions: Assess peripheral edema status every visit. PARISHA, AMEY ([id number]) Notes: Wound/Skin Impairment Nursing Diagnoses: Impaired tissue integrity Goals: Ulcer/skin breakdown will heal within 14 weeks Date Initiated: 08/27/2019 Target Resolution Date: 11/20/2019 Goal Status: Active Interventions: Assess patient/caregiver ability to obtain necessary supplies Assess patient/caregiver ability to perform ulcer/skin care regimen upon admission and as needed Assess ulceration(s) every visit Notes: Electronic Signature(s) Signed: 09/06/2019 4:23:20 PM By: Harold Barban Entered By: Harold Barban on 09/06/2019 15:51:08 Waren, [place] ([id number]) -------------------------------------------------------------------------------- Pain Assessment Details Patient Name: [name] Date of Service: 09/06/2019 2:45 PM Medical Record Number: [id number] Patient Account Number: [id number] Date of Birth/Sex: Dec 25, 1955 (63 y.o. F) Treating RN: Harold Barban Primary Care Brice Potteiger: Neta Ehlers Other Clinician: Referring Claxton Levitz: Neta Ehlers Treating Dariona Postma/Extender: Beverly Gust in Treatment: 1 Active Problems Location of Pain Severity and Description of Pain Patient Has Paino Yes Site Locations Rate the pain. Current Pain Level: 3 Pain Management and Medication Current Pain Management: Electronic Signature(s) Signed: 09/06/2019 4:12:23  PM By: Lorine Bears RCP, RRT, CHT Signed: 09/06/2019 4:23:20 PM By: Harold Barban Entered By: Lorine Bears on 09/06/2019 15:13:14 Jagiello, [name] ([id number]) -------------------------------------------------------------------------------- Patient/Caregiver Education Details Patient Name: [name]. Date of Service: 09/06/2019 2:45 PM Medical Record Number: [id number] Patient Account Number: [id number] Date of Birth/Gender: 1955-12-15 (63 y.o. F) Treating RN: Harold Barban Primary Care Physician: Neta Ehlers Other Clinician: Referring Physician: Neta Ehlers Treating Physician/Extender: Beverly Gust in Treatment: 1 Education Assessment Education Provided To: Patient Education Topics Provided Wound/Skin Impairment: Handouts: Caring for Your Ulcer Methods: Demonstration, Explain/Verbal Responses: State content correctly Electronic Signature(s) Signed: 09/06/2019 4:23:20 PM By: Harold Barban Entered By: Harold Barban on 09/06/2019 15:51:47 [place], [place] ([id number]) -------------------------------------------------------------------------------- Wound Assessment Details Patient Name: [name] Date of Service: 09/06/2019 2:45 PM Medical Record Number: [id number] Patient Account Number: [id number] Date of Birth/Sex: 11/28/1955 (63 y.o. F) Treating RN: Montey Hora Primary Care Kelsei Defino:  Neta Ehlers Other Clinician: Referring Alaynna Kerwood: Neta Ehlers Treating Chandani Rogowski/Extender: Beverly Gust in Treatment: 1 Wound Status Wound Number: 1 Primary Lymphedema Etiology: Wound Location: Left Lower Leg - Circumferential Wound Status: Open Wounding Event: Gradually Appeared Comorbid Anemia, Coronary Artery Disease, Date Acquired: 08/04/2019 History: Hypertension Weeks Of Treatment: 1 Clustered Wound: No Photos Wound Measurements Length: (cm) 18 Width: (cm) 56 Depth: (cm) 0.2 Area: (cm)  791.681 Volume: (cm) 158.336 % Reduction in Area: 23.1% % Reduction in Volume: -53.8% Epithelialization: Small (1-33%) Tunneling: No Undermining: No Wound Description Full Thickness Without Exposed Support Classification: Structures Wound Margin: Indistinct, nonvisible Exudate Large Amount: Exudate Type: Serous Exudate Color: amber Foul Odor After Cleansing: No Slough/Fibrino Yes Wound Bed Granulation Amount: Medium (34-66%) Exposed Structure Granulation Quality: Pink Fascia Exposed: No Necrotic Amount: Medium (34-66%) Fat Layer (Subcutaneous Tissue) Exposed: Yes Necrotic Quality: Adherent Slough Tendon Exposed: No Muscle Exposed: No Joint Exposed: No Bone Exposed: No [place], [place] L. ([id number]) Assessment Notes copiuos drainage Electronic Signature(s) Signed: 09/06/2019 4:30:26 PM By: Montey Hora Entered By: Montey Hora on 09/06/2019 15:26:19 Joerger, Organ ([id number]) -------------------------------------------------------------------------------- Wound Assessment Details Patient Name: [name] Date of Service: 09/06/2019 2:45 PM Medical Record Number: [id number] Patient Account Number: [id number] Date of Birth/Sex: 10-17-1955 (63 y.o. F) Treating RN: Montey Hora Primary Care Haylo Fake: Neta Ehlers Other Clinician: Referring Audrey Thull: Neta Ehlers Treating Jordyan Hardiman/Extender: Beverly Gust in Treatment: 1 Wound Status Wound Number: 2 Primary Lymphedema Etiology: Wound Location: Right Lower Leg - Circumferential Wound Status: Open Wounding Event: Gradually Appeared Comorbid Anemia, Coronary Artery Disease, Date Acquired: 08/04/2019 History: Hypertension Weeks Of Treatment: 1 Clustered Wound: No Photos Wound Measurements Length: (cm) 17 Width: (cm) 41 Depth: (cm) 0.1 Area: (cm) 547.423 Volume: (cm) 54.742 % Reduction in Area: -93.6% % Reduction in Volume: -93.6% Epithelialization: Medium (34-66%) Tunneling:  No Undermining: No Wound Description Classification: Partial Thickness Wound Margin: Flat and Intact Exudate Amount: Large Exudate Type: Serous Exudate Color: amber Foul Odor After Cleansing: No Slough/Fibrino No Wound Bed Granulation Amount: Medium (34-66%) Exposed Structure Granulation Quality: Pink Fascia Exposed: No Necrotic Amount: Medium (34-66%) Fat Layer (Subcutaneous Tissue) Exposed: Yes Necrotic Quality: Adherent Slough Tendon Exposed: No Muscle Exposed: No Joint Exposed: No Bone Exposed: No Assessment Notes copious drainage [place], [place] ([id number]) Electronic Signature(s) Signed: 09/06/2019 4:30:26 PM By: Montey Hora Entered By: Montey Hora on 09/06/2019 15:27:13 Furtick, [name] ([id number]) -------------------------------------------------------------------------------- Vitals Details Patient Name: [name] Date of Service: 09/06/2019 2:45 PM Medical Record Number: [id number] Patient Account Number: [id number] Date of Birth/Sex: 04/02/1956 (63 y.o. F) Treating RN: Harold Barban Primary Care Lakayla Barrington: Neta Ehlers Other Clinician: Referring Serine Kea: Neta Ehlers Treating Alanya Vukelich/Extender: Beverly Gust in Treatment: 1 Vital Signs Time Taken: 15:10 Temperature (F): 99.3 Height (in): 62 Pulse (bpm): 103 Weight (lbs): 290 Respiratory Rate (breaths/min): 20 Body Mass Index (BMI): 53 Blood Pressure (mmHg): 148/72 Reference Range: 80 - 120 mg / dl Electronic Signature(s) Signed: 09/06/2019 4:12:23 PM By: Lorine Bears RCP, RRT, CHT Entered By: Lorine Bears on 09/06/2019 15:13:42

## 2019-09-09 NOTE — Therapy (Signed)
[place] MAIN [facility] SERVICES [address] [place], [place], [address] Phone: [phone]   Fax:  [phone]  Occupational Therapy Treatment  Patient Details  Name: [name] MRN: [id number] Date of Birth: 11-06-1955 Referring Provider (OT): Jacqlyn Larsen , MD   Encounter Date: 09/01/2019    Past Medical History:  Diagnosis Date  . Aortic stenosis 03/23/2013   Overview:  02/18/13 TTE EF >55%. Critical AS with mean Ao valve gradient of 82 mm Hg. No AI. No MR, PR, mild TR. Estimated RVSP 30 mm Hg.  . Bicuspid aortic valve   . Edema of both legs 02/20/2017  . 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 Heart failure ([place])   . HTN (hypertension) 03/23/2013  . 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 Morbid obesity ([place]) 02/20/2017  . Sleep apnea     Past Surgical History:  Procedure Laterality Date  . BUBBLE STUDY  06/07/2019   Procedure: BUBBLE STUDY;  Surgeon: Buford Dresser, MD;  Location: [place];  Service: Cardiovascular;;  . CHOLECYSTECTOMY  1983  . RIGHT/LEFT HEART CATH AND CORONARY ANGIOGRAPHY N/A 04/01/2019   Procedure: RIGHT/LEFT HEART CATH AND CORONARY ANGIOGRAPHY;  Surgeon: Lorretta Harp, MD;  Location: [place] CV LAB;  Service: Cardiovascular;  Laterality: N/A;  . TEE WITHOUT CARDIOVERSION N/A 07/01/2018   Procedure: TRANSESOPHAGEAL ECHOCARDIOGRAM (TEE);  Surgeon: Buford Dresser, MD;  Location: [name] ENDOSCOPY;  Service: Cardiovascular;  Laterality: N/A;  . TEE WITHOUT CARDIOVERSION N/A 06/07/2019   Procedure: TRANSESOPHAGEAL ECHOCARDIOGRAM (TEE);  Surgeon: Buford Dresser, MD;  Location: [facility] ENDOSCOPY;  Service: Cardiovascular;  Laterality: N/A;  . TISSUE AORTIC VALVE REPLACEMENT      There were no vitals filed for this visit.    Plan: Pt did not attend visit.                         OT Long Term Goals - 08/04/19 1600      OT LONG TERM GOAL #1   Title  Pt will  be able to verbalize signs and symptoms of cellulitis infection and identify 4 common lymphedema precautions using printed resource for reference (modified independence) to limit LE progression over time .    Baseline  Max A    Time  4    Period  Days    Status  Partially Met      OT LONG TERM GOAL #2   Title  Pt will apply BLE, knee length, multi-layer, short stretch compression wraps daily using correct gradient techniques with max CG assist    to achieve optimal limb volume reduction, to return affected limb , as closely as possible, to premorbid size and shape, to limit infection risk, and to improve safe functional ambulation and mobility.    Baseline  dependent    Time  4    Period  Days    Status  Achieved      OT LONG TERM GOAL #3   Title  Pt to achieve at least 10% BLE limb volume reductions during Intensive Phase CDT to improve independence and safety with functional performance of basic and instrumental ADLs, functional  mobility and ambulation, and  to limit  infection risk and LE progression.    Baseline  Dependent    Time  12    Period  Weeks    Status  Not Met   LLE below the knee is increased by 33% since starting CDT  OT LONG TERM GOAL #4   Title  Pt will achieve and sustain at least 85% compliance with daily LE self-care home program (skin care, lymphatic pumping therex, compression and simple self-MLD) ) with Max CG assistance during Intensive Phase CDT  to limit  limb swelling, reduce infection risk,  limit associated pain , and limit LE progression.    Baseline  dependent    Time  12    Period  Weeks    Status  Partially Met      OT LONG TERM GOAL #5   Title  Pt will be able to don and doff appropriate compression garments and/ or devices using correct techniques and assistive devices with Max CG A by issue date  for optimal LE management to limit progression over time.    Baseline  12    Status  On-going      OT LONG TERM GOAL #6   Title  During  self-management phase of CDT Pt will retain limb volume reductions achieved during Intensive Phase CDT with Max CG A with no more than 3% volume increase to limit LE progression and further functional decline.    Baseline  dependent    Time  6    Period  Months    Status  On-going              Patient will benefit from skilled therapeutic intervention in order to improve the following deficits and impairments:           Visit Diagnosis: Lymphedema, not elsewhere classified    Problem List Patient Active Problem List   Diagnosis Date Noted  . Long term (current) use of anticoagulants 06/11/2019  . TIA (transient ischemic attack) 06/04/2019  . Transient neurologic deficit 06/03/2019  . Anemia 06/03/2019  . Chronic diastolic CHF (congestive heart failure) ([name]) 06/03/2019  . CAD (coronary artery disease) 06/03/2019  . Essential hypertension 06/03/2019  . Sleep apnea 09/25/2018  . At risk for obstructive sleep apnea 08/14/2018  . Obstructive hypertrophic cardiomyopathy ([place]) 07/10/2018  . Dysfunctional uterine bleeding 04/22/2018  . Edema of both legs 02/20/2017  . 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 Morbid obesity ([place]) 02/20/2017  . Aortic stenosis 03/23/2013  . Hypertensive heart disease with heart failure ([place]) 03/23/2013    Ansel Bong 09/09/2019, 6:40 AM  [place] MAIN [facility] SERVICES [address] [place], [place], [address] Phone: [phone]   Fax:  [phone]  Name: [name] SESSLER MRN: [id number] Date of Birth: Jan 15, 1956

## 2019-09-09 NOTE — Therapy (Signed)
[place] MAIN [facility] SERVICES [address] [place], [place], [address] Phone: [phone]   Fax:  [phone]  Patient Details  Name: [name] MRN: [id number] Date of Birth: 1955-10-06 Referring Provider:  [name], MD  Encounter Date: 09/01/2019   [name] 09/09/2019, 6:40 AM  [place] MAIN [facility] SERVICES [address] [place], [place], [address] Phone: [phone]   Fax:  [phone]

## 2019-09-13 ENCOUNTER — Encounter: Payer: BC Managed Care – PPO | Admitting: Occupational Therapy

## 2019-09-14 ENCOUNTER — Encounter: Payer: Medicare Other | Attending: Physician Assistant | Admitting: Physician Assistant

## 2019-09-14 ENCOUNTER — Emergency Department (HOSPITAL_COMMUNITY)
Admission: EM | Admit: 2019-09-14 | Discharge: 2019-09-15 | Disposition: A | Discharge status: Home / Self Care | Payer: Medicare Other | Attending: Emergency Medicine | Admitting: Emergency Medicine

## 2019-09-14 ENCOUNTER — Encounter (HOSPITAL_COMMUNITY): Payer: Self-pay | Admitting: Pharmacy Technician

## 2019-09-14 ENCOUNTER — Other Ambulatory Visit: Payer: Self-pay | Admitting: Cardiology

## 2019-09-14 ENCOUNTER — Other Ambulatory Visit: Payer: Self-pay

## 2019-09-14 DIAGNOSIS — L97822 Non-pressure chronic ulcer of other part of left lower leg with fat layer exposed: Secondary | ICD-10-CM | POA: Diagnosis present

## 2019-09-14 DIAGNOSIS — Z79899 Other long term (current) drug therapy: Secondary | ICD-10-CM | POA: Diagnosis not present

## 2019-09-14 DIAGNOSIS — I10 Essential (primary) hypertension: Secondary | ICD-10-CM | POA: Insufficient documentation

## 2019-09-14 DIAGNOSIS — I2510 Atherosclerotic heart disease of native coronary artery without angina pectoris: Secondary | ICD-10-CM | POA: Insufficient documentation

## 2019-09-14 DIAGNOSIS — I891 Lymphangitis: Secondary | ICD-10-CM | POA: Insufficient documentation

## 2019-09-14 DIAGNOSIS — D649 Anemia, unspecified: Secondary | ICD-10-CM | POA: Insufficient documentation

## 2019-09-14 DIAGNOSIS — I5032 Chronic diastolic (congestive) heart failure: Secondary | ICD-10-CM | POA: Diagnosis not present

## 2019-09-14 DIAGNOSIS — Z6841 Body Mass Index (BMI) 40.0 and over, adult: Secondary | ICD-10-CM | POA: Diagnosis not present

## 2019-09-14 DIAGNOSIS — I890 Lymphedema, not elsewhere classified: Secondary | ICD-10-CM | POA: Insufficient documentation

## 2019-09-14 DIAGNOSIS — I110 Hypertensive heart disease with heart failure: Secondary | ICD-10-CM | POA: Insufficient documentation

## 2019-09-14 DIAGNOSIS — G4733 Obstructive sleep apnea (adult) (pediatric): Secondary | ICD-10-CM | POA: Diagnosis not present

## 2019-09-14 DIAGNOSIS — L03116 Cellulitis of left lower limb: Secondary | ICD-10-CM | POA: Diagnosis not present

## 2019-09-14 DIAGNOSIS — Z7901 Long term (current) use of anticoagulants: Secondary | ICD-10-CM | POA: Diagnosis not present

## 2019-09-14 DIAGNOSIS — L539 Erythematous condition, unspecified: Secondary | ICD-10-CM | POA: Diagnosis present

## 2019-09-14 DIAGNOSIS — L97812 Non-pressure chronic ulcer of other part of right lower leg with fat layer exposed: Secondary | ICD-10-CM | POA: Diagnosis not present

## 2019-09-14 DIAGNOSIS — L03119 Cellulitis of unspecified part of limb: Secondary | ICD-10-CM

## 2019-09-14 LAB — BASIC METABOLIC PANEL
Anion gap: 10 (ref 5–15)
BUN: 11 mg/dL (ref 8–23)
CO2: 27 mmol/L (ref 22–32)
Calcium: 9.3 mg/dL (ref 8.9–10.3)
Chloride: 100 mmol/L (ref 98–111)
Creatinine, Ser: 0.77 mg/dL (ref 0.44–1.00)
GFR calc Af Amer: >60 mL/min (ref >60)
GFR calc non Af Amer: >60 mL/min (ref >60)
Glucose, Bld: 129 mg/dL — ABNORMAL HIGH (ref 70–99)
Potassium: 3.9 mmol/L (ref 3.5–5.1)
Sodium: 137 mmol/L (ref 135–145)

## 2019-09-14 LAB — CBC
HCT: 33 % — ABNORMAL LOW (ref 36.0–46.0)
Hemoglobin: 8.8 g/dL — ABNORMAL LOW (ref 12.0–15.0)
MCH: 21.4 pg — ABNORMAL LOW (ref 26.0–34.0)
MCHC: 26.7 g/dL — ABNORMAL LOW (ref 30.0–36.0)
MCV: 80.1 fL (ref 80.0–100.0)
Platelets: 362 10*3/uL (ref 150–400)
RBC: 4.12 MIL/uL (ref 3.87–5.11)
RDW: 21.5 % — ABNORMAL HIGH (ref 11.5–15.5)
WBC: 15.8 10*3/uL — ABNORMAL HIGH (ref 4.0–10.5)
nRBC: 0 % (ref 0.0–0.2)

## 2019-09-14 LAB — PROTIME-INR
INR: 1.3 — ABNORMAL HIGH (ref 0.8–1.2)
Prothrombin Time: 16.1 seconds — ABNORMAL HIGH (ref 11.4–15.2)

## 2019-09-14 MED ORDER — SODIUM CHLORIDE 0.9% FLUSH: 3.0000 mL | Freq: Once | INTRAVENOUS | Status: DC

## 2019-09-14 NOTE — Telephone Encounter (Signed)
*  STAT* If patient is at the pharmacy, call can be transferred to refill team.   1. Which medications need to be refilled? (please list name of each medication and dose if known) Metoprolol 25mg  takes 1 twice daily   2. Which pharmacy/location (including street and city if local pharmacy) is medication to be sent to? [facility] [place]   3. Do they need a 30 day or 90 day supply? [place]

## 2019-09-14 NOTE — Progress Notes (Signed)
ROLLA, HARDIMON ([id number]) Visit Report for 09/14/2019 Arrival Information Details Patient Name: [name], [name]. Date of Service: 09/14/2019 1:30 PM Medical Record Number: [id number] Patient Account Number: [id number] Date of Birth/Sex: Jan 31, 1956 (64 y.o. F) Treating RN: Montey Hora Primary Care Zade Falkner: Neta Ehlers Other Clinician: Referring Nagee Goates: Neta Ehlers Treating Jaevin Medearis/Extender: Melburn Hake, HOYT Weeks in Treatment: 2 Visit Information History Since Last Visit Added or deleted any medications: No Patient Arrived: Wheel Chair Any new allergies or adverse reactions: No Arrival Time: 13:45 Had a fall or experienced change in No Accompanied By: husband activities of daily living that may affect Transfer Assistance: None risk of falls: Patient Identification Verified: Yes Signs or symptoms of abuse/neglect since last visito No Secondary Verification Process Yes Hospitalized since last visit: No Completed: Implantable device outside of the clinic excluding No Patient Has Alerts: Yes cellular tissue based products placed in the center Patient Alerts: Patient on Blood since last visit: Thinner Has Dressing in Place as Prescribed: Yes warfarin Has Compression in Place as Prescribed: Yes Not Diabetic 04/29/2019 [facility] Pain Present Now: Yes ABI R)1.17 L)1.24 TBI R) 1.12 L) 0.96 Electronic Signature(s) Signed: 09/14/2019 3:01:49 PM By: Lorine Bears RCP, RRT, CHT Entered By: Lorine Bears on 09/14/2019 13:46:41 [place], [name] ([id number]) -------------------------------------------------------------------------------- Clinic Level of Care Assessment Details Patient Name: [place], [name]. Date of Service: 09/14/2019 1:30 PM Medical Record Number: [id number] Patient Account Number: [id number] Date of Birth/Sex: [birth year] (64 y.o. F) Treating RN: Montey Hora Primary Care Lemarcus Baggerly: Neta Ehlers Other Clinician: Referring Wilder Amodei:  Neta Ehlers Treating Jaydynn Wolford/Extender: Melburn Hake, HOYT Weeks in Treatment: 2 Clinic Level of Care Assessment Items TOOL 4 Quantity Score []  - Use when only an EandM is performed on FOLLOW-UP visit 0 ASSESSMENTS - Nursing Assessment / Reassessment X - Reassessment of Co-morbidities (includes updates in patient status) 1 10 X- 1 5 Reassessment of Adherence to Treatment Plan ASSESSMENTS - Wound and Skin Assessment / Reassessment []  - Simple Wound Assessment / Reassessment - one wound 0 X- 2 5 Complex Wound Assessment / Reassessment - multiple wounds []  - 0 Dermatologic / Skin Assessment (not related to wound area) ASSESSMENTS - Focused Assessment X - Circumferential Edema Measurements - multi extremities 2 5 []  - 0 Nutritional Assessment / Counseling / Intervention X- 1 5 Lower Extremity Assessment (monofilament, tuning fork, pulses) []  - 0 Peripheral Arterial Disease Assessment (using hand held doppler) ASSESSMENTS - Ostomy and/or Continence Assessment and Care []  - Incontinence Assessment and Management 0 []  - 0 Ostomy Care Assessment and Management (repouching, etc.) PROCESS - Coordination of Care X - Simple Patient / Family Education for ongoing care 1 15 []  - 0 Complex (extensive) Patient / Family Education for ongoing care X- 1 10 Staff obtains Programmer, systems, Records, Test Results / Process Orders []  - 0 Staff telephones HHA, Nursing Homes / Clarify orders / etc []  - 0 Routine Transfer to another Facility (non-emergent condition) []  - 0 Routine Hospital Admission (non-emergent condition) []  - 0 New Admissions / Biomedical engineer / Ordering NPWT, Apligraf, etc. []  - 0 Emergency Hospital Admission (emergent condition) X- 1 10 Simple Discharge Coordination Friesz, Pierre L. ([id number]) []  - 0 Complex (extensive) Discharge Coordination PROCESS - Special Needs []  - Pediatric / Minor Patient Management 0 []  - 0 Isolation Patient Management []  - 0 Hearing /  Language / Visual special needs []  - 0 Assessment of Community assistance (transportation, D/C planning, etc.) []  - 0 Additional assistance / Altered mentation []  -  0 Support Surface(s) Assessment (bed, cushion, seat, etc.) INTERVENTIONS - Wound Cleansing / Measurement []  - Simple Wound Cleansing - one wound 0 X- 2 5 Complex Wound Cleansing - multiple wounds X- 1 5 Wound Imaging (photographs - any number of wounds) []  - 0 Wound Tracing (instead of photographs) []  - 0 Simple Wound Measurement - one wound X- 2 5 Complex Wound Measurement - multiple wounds INTERVENTIONS - Wound Dressings []  - Small Wound Dressing one or multiple wounds 0 []  - 0 Medium Wound Dressing one or multiple wounds X- 2 20 Large Wound Dressing one or multiple wounds []  - 0 Application of Medications - topical []  - 0 Application of Medications - injection INTERVENTIONS - Miscellaneous []  - External ear exam 0 []  - 0 Specimen Collection (cultures, biopsies, blood, body fluids, etc.) []  - 0 Specimen(s) / Culture(s) sent or taken to Lab for analysis []  - 0 Patient Transfer (multiple staff / Civil Service fast streamer / Similar devices) []  - 0 Simple Staple / Suture removal (25 or less) []  - 0 Complex Staple / Suture removal (26 or more) []  - 0 Hypo / Hyperglycemic Management (close monitor of Blood Glucose) []  - 0 Ankle / Brachial Index (ABI) - do not check if billed separately X- 1 5 Vital Signs [place], [place]. ([id number]) Has the patient been seen at the hospital within the last three years: Yes Total Score: 145 Level Of Care: New/Established - Level 4 Electronic Signature(s) Signed: 09/14/2019 4:56:10 PM By: Montey Hora Entered By: Montey Hora on 09/14/2019 14:32:35 [place], [name] ([id number]) -------------------------------------------------------------------------------- Encounter Discharge Information Details Patient Name: [name] Date of Service: 09/14/2019 1:30 PM Medical Record Number:  [id number] Patient Account Number: [id number] Date of Birth/Sex: Jun 07, 1956 (64 y.o. F) Treating RN: Montey Hora Primary Care Eulamae Greenstein: Neta Ehlers Other Clinician: Referring Jahmeer Porche: Neta Ehlers Treating Jovie Swanner/Extender: Sharalyn Ink in Treatment: 2 Encounter Discharge Information Items Discharge Condition: Stable Ambulatory Status: Wheelchair Discharge Destination: Home Transportation: Private Auto Accompanied By: husband Schedule Follow-up Appointment: Yes Clinical Summary of Care: Electronic Signature(s) Signed: 09/14/2019 4:56:10 PM By: Montey Hora Entered By: Montey Hora on 09/14/2019 14:23:45 [place], Niylah L. ([id number]) -------------------------------------------------------------------------------- Lower Extremity Assessment Details Patient Name: [name] Date of Service: 09/14/2019 1:30 PM Medical Record Number: [id number] Patient Account Number: [id number] Date of Birth/Sex: 1956/08/20 (64 y.o. F) Treating RN: Army Melia Primary Care Christoffer Currier: Neta Ehlers Other Clinician: Referring Gracielynn Birkel: Neta Ehlers Treating Larance Ratledge/Extender: Melburn Hake, HOYT Weeks in Treatment: 2 Edema Assessment Assessed: [Left: No] [Right: No] Edema: [Left: Yes] [Right: Yes] Calf Left: Right: Point of Measurement: 32 cm From Medial Instep 62.4 cm 55.7 cm Ankle Left: Right: Point of Measurement: 10 cm From Medial Instep 32.5 cm 29.5 cm Vascular Assessment Pulses: Dorsalis Pedis Palpable: [Left:Yes] [Right:Yes] Electronic Signature(s) Signed: 09/14/2019 4:03:57 PM By: Army Melia Entered By: Army Melia on 09/14/2019 13:49:39 [place], [place] ([id number]) -------------------------------------------------------------------------------- Multi Wound Chart Details Patient Name: [name] Date of Service: 09/14/2019 1:30 PM Medical Record Number: [id number] Patient Account Number: [id number] Date of Birth/Sex: 1956/03/11 (64 y.o. F) Treating RN:  Montey Hora Primary Care Blayde Bacigalupi: Neta Ehlers Other Clinician: Referring Shaughnessy Gethers: Neta Ehlers Treating Talana Slatten/Extender: Melburn Hake, HOYT Weeks in Treatment: 2 Vital Signs Height(in): 62 Pulse(bpm): 92 Weight(lbs): 290 Blood Pressure(mmHg): 147/54 Body Mass Index(BMI): 53 Temperature(F): 98.4 Respiratory Rate 20 (breaths/min): Photos: [1:No Photos] [2:No Photos] [N/A:N/A] Wound Location: [1:Left, Circumferential Lower Leg] [2:Right, Circumferential Lower Leg] [N/A:N/A] Wounding Event: [1:Gradually Appeared] [2:Gradually  Appeared] [N/A:N/A] Primary Etiology: [1:Lymphedema] [2:Lymphedema] [N/A:N/A] Date Acquired: [1:08/04/2019] [2:08/04/2019] [N/A:N/A] Weeks of Treatment: [1:2] [2:2] [N/A:N/A] Wound Status: [1:Open] [2:Open] [N/A:N/A] Measurements L x W x D [1:24x57.5x0.1] [2:15.5x35x0.1] [N/A:N/A] (cm) Area (cm) : [id number] [N/A:N/A] Volume (cm) : [1:108.385] [2:42.608] [N/A:N/A] % Reduction in Area: [1:-5.30%] [2:-50.70%] [N/A:N/A] % Reduction in Volume: [1:-5.30%] [2:-50.70%] [N/A:N/A] Classification: [1:Full Thickness Without Exposed Support Structures] [2:Partial Thickness] [N/A:N/A] Treatment Notes Electronic Signature(s) Signed: 09/14/2019 4:56:10 PM By: Montey Hora Entered By: Montey Hora on 09/14/2019 14:18:45 [place], [name] ([id number]) -------------------------------------------------------------------------------- [place] Details Patient Name: [name]. Date of Service: 09/14/2019 1:30 PM Medical Record Number: [id number] Patient Account Number: [id number] Date of Birth/Sex: Mar 06, 1956 (64 y.o. F) Treating RN: Montey Hora Primary Care Allissa Albright: Neta Ehlers Other Clinician: Referring Imad Shostak: Neta Ehlers Treating Mariann Palo/Extender: Melburn Hake, HOYT Weeks in Treatment: 2 Active Inactive Abuse / Safety / Falls / Self Care Management Nursing Diagnoses: Potential for falls Goals: Patient  will not experience any injury related to falls Date Initiated: 08/27/2019 Target Resolution Date: 11/20/2019 Goal Status: Active Interventions: Assess fall risk on admission and as needed Notes: Orientation to the Wound Care Program Nursing Diagnoses: Knowledge deficit related to the wound healing center program Goals: Patient/caregiver will verbalize understanding of the [facility] Program Date Initiated: 08/27/2019 Target Resolution Date: 11/20/2019 Goal Status: Active Interventions: Provide education on orientation to the wound center Notes: Venous Leg Ulcer Nursing Diagnoses: Potential for venous Insuffiency (use before diagnosis confirmed) Goals: Patient will maintain optimal edema control Date Initiated: 08/27/2019 Target Resolution Date: 11/20/2019 Goal Status: Active Interventions: Assess peripheral edema status every visit. KAYL, ERIKSEN ([id number]) Notes: Wound/Skin Impairment Nursing Diagnoses: Impaired tissue integrity Goals: Ulcer/skin breakdown will heal within 14 weeks Date Initiated: 08/27/2019 Target Resolution Date: 11/20/2019 Goal Status: Active Interventions: Assess patient/caregiver ability to obtain necessary supplies Assess patient/caregiver ability to perform ulcer/skin care regimen upon admission and as needed Assess ulceration(s) every visit Notes: Electronic Signature(s) Signed: 09/14/2019 4:56:10 PM By: Montey Hora Entered By: Montey Hora on 09/14/2019 14:18:38 [place], [place]. ([id number]) -------------------------------------------------------------------------------- Pain Assessment Details Patient Name: [name] Date of Service: 09/14/2019 1:30 PM Medical Record Number: [id number] Patient Account Number: [id number] Date of Birth/Sex: 12-26-1955 (64 y.o. F) Treating RN: Montey Hora Primary Care Naamah Boggess: Neta Ehlers Other Clinician: Referring Darianna Amy: Neta Ehlers Treating Jaydin Boniface/Extender: Melburn Hake,  HOYT Weeks in Treatment: 2 Active Problems Location of Pain Severity and Description of Pain Patient Has Paino Yes Site Locations Rate the pain. Current Pain Level: 4 Pain Management and Medication Current Pain Management: Electronic Signature(s) Signed: 09/14/2019 3:01:49 PM By: Lorine Bears RCP, RRT, CHT Signed: 09/14/2019 4:56:10 PM By: Montey Hora Entered By: Lorine Bears on 09/14/2019 13:46:58 [place], [name] ([id number]) -------------------------------------------------------------------------------- Patient/Caregiver Education Details Patient Name: [name]. Date of Service: 09/14/2019 1:30 PM Medical Record Number: [id number] Patient Account Number: [id number] Date of Birth/Gender: Jan 27, 1956 (64 y.o. F) Treating RN: Montey Hora Primary Care Physician: Neta Ehlers Other Clinician: Referring Physician: Neta Ehlers Treating Physician/Extender: Sharalyn Ink in Treatment: 2 Education Assessment Education Provided To: Patient and Caregiver Education Topics Provided Venous: Handouts: Other: need for edema control Methods: Explain/Verbal Responses: State content correctly Electronic Signature(s) Signed: 09/14/2019 4:56:10 PM By: Montey Hora Entered By: Montey Hora on 09/14/2019 14:22:09 [place], Insiya L. ([id number]) -------------------------------------------------------------------------------- Wound Assessment Details Patient Name: [name] Date of Service: 09/14/2019 1:30 PM Medical Record Number: [id number] Patient Account Number: [id number] Date of  Birth/Sex: 1955/09/26 (64 y.o. F) Treating RN: Army Melia Primary Care Brooke Payes: Neta Ehlers Other Clinician: Referring Railynn Ballo: Neta Ehlers Treating Brittinie Wherley/Extender: Melburn Hake, HOYT Weeks in Treatment: 2 Wound Status Wound Number: 1 Primary Etiology: Lymphedema Wound Location: Left, Circumferential Lower Leg Wound Status: Open Wounding  Event: Gradually Appeared Date Acquired: 08/04/2019 Weeks Of Treatment: 2 Clustered Wound: No Wound Measurements Length: (cm) 24 Width: (cm) 57.5 Depth: (cm) 0.1 Area: (cm) 1083.849 Volume: (cm) 108.385 % Reduction in Area: -5.3% % Reduction in Volume: -5.3% Wound Description Full Thickness Without Exposed Support Classification: Structures Treatment Notes Wound #1 (Left, Circumferential Lower Leg) Notes xtrasorb, abd, 4 layer bilateral with unna to anchor Electronic Signature(s) Signed: 09/14/2019 4:03:57 PM By: Army Melia Entered By: Army Melia on 09/14/2019 13:51:16 [place], [place] ([id number]) -------------------------------------------------------------------------------- Wound Assessment Details Patient Name: [name] Date of Service: 09/14/2019 1:30 PM Medical Record Number: [id number] Patient Account Number: [id number] Date of Birth/Sex: 1956-08-03 (64 y.o. F) Treating RN: Army Melia Primary Care Maylon Sailors: Neta Ehlers Other Clinician: Referring Marisa Hage: Neta Ehlers Treating Vauda Salvucci/Extender: Melburn Hake, HOYT Weeks in Treatment: 2 Wound Status Wound Number: 2 Primary Etiology: Lymphedema Wound Location: Right, Circumferential Lower Leg Wound Status: Open Wounding Event: Gradually Appeared Date Acquired: 08/04/2019 Weeks Of Treatment: 2 Clustered Wound: No Wound Measurements Length: (cm) 15.5 Width: (cm) 35 Depth: (cm) 0.1 Area: (cm) 426.079 Volume: (cm) 42.608 % Reduction in Area: -50.7% % Reduction in Volume: -50.7% Wound Description Classification: Partial Thickness Treatment Notes Wound #2 (Right, Circumferential Lower Leg) Notes xtrasorb, abd, 4 layer bilateral with unna to anchor Electronic Signature(s) Signed: 09/14/2019 4:03:57 PM By: Army Melia Entered By: Army Melia on 09/14/2019 13:51:16 [place], [place] ([id number]) -------------------------------------------------------------------------------- Vitals  Details Patient Name: [name] Date of Service: 09/14/2019 1:30 PM Medical Record Number: [id number] Patient Account Number: [id number] Date of Birth/Sex: 1956/05/25 (64 y.o. F) Treating RN: Montey Hora Primary Care Lillee Mooneyhan: Neta Ehlers Other Clinician: Referring Kamrin Sibley: Neta Ehlers Treating Ian Cavey/Extender: Melburn Hake, HOYT Weeks in Treatment: 2 Vital Signs Time Taken: 13:45 Temperature (F): 98.4 Height (in): 62 Pulse (bpm): 92 Weight (lbs): 290 Respiratory Rate (breaths/min): 20 Body Mass Index (BMI): 53 Blood Pressure (mmHg): 147/54 Reference Range: 80 - 120 mg / dl Electronic Signature(s) Signed: 09/14/2019 3:01:49 PM By: Lorine Bears RCP, RRT, CHT Entered By: Lorine Bears on 09/14/2019 13:47:51

## 2019-09-14 NOTE — Progress Notes (Addendum)
[name] ([id number]) Visit Report for 09/14/2019 Chief Complaint Document Details Patient Name: [name]. Date of Service: 09/14/2019 1:30 PM Medical Record Number: [id number] Patient Account Number: [id number] Date of Birth/Sex: 1956/07/11 (64 y.o. F) F) Treating RN: Montey Hora Primary Care Provider: Neta Ehlers Other Clinician: Referring Provider: Neta Ehlers Treating Provider/Extender: Melburn Hake, Zeppelin Commisso Weeks in Treatment: 2 Information Obtained from: Patient Chief Complaint Bilateral LE Lymphedema and ulcers Electronic Signature(s) Signed: 09/14/2019 2:14:39 PM By: Worthy Keeler PA-C Entered By: Worthy Keeler on 09/14/2019 14:14:39 [place], [place] ([id number]) -------------------------------------------------------------------------------- HPI Details Patient Name: Martha Downs L. Date of Service: 09/14/2019 1:30 PM Medical Record Number: [id number] Patient Account Number: [id number] Date of Birth/Sex: 1956/03/05 (64 y.o. F) Treating RN: Montey Hora Primary Care Provider: Neta Ehlers Other Clinician: Referring Provider: Neta Ehlers Treating Provider/Extender: Melburn Hake, Geneieve Duell Weeks in Treatment: 2 History of Present Illness HPI Description: 08/27/2019 patient presents today for initial evaluation here in our clinic concerning issues that she has been having with her bilateral lower extremities with lymphedema and weeping. She is previously been a patient over the past year of Dr. Jerilee Hoh at the wound care center in [facility]. With that being said she subsequently was recommended by the lymphedema clinic here in [place] to follow-up with [place] here instead of going back to [place] since they were in closer contact with the clinic here than they are obviously. With that being said the patient states that is why she came her way in order to be evaluated at this point. Fortunately there is no signs of active infection at this time. No  fevers, chills, nausea, vomiting, or diarrhea. The patient does have a history of lymphedema, hypertension, long-term use of anticoagulant therapy, and obstructive sleep apnea. During the visit today and from checking into the discharge she was prone to unexpectedly falling asleep. I did discuss this with her daughter as well who states that this is very common for her she states she is not really sure how compliant her mother is with her CPAP machine that may be part of the issue. The patient did have ABIs performed which revealed have copies of his well today that was on 05/01/2019 and it showed a right ABI of 1.17 with a TBI of 1.12 and a left ABI of 1.24 with a TBI of 0.96. She has had 2 transfusions in the past 2 weeks apparently as well. 08/31/2019 upon evaluation today patient actually appears to be doing somewhat better in regard to her lower extremity ulcers. She still has a tremendous amount of drainage from the left lower extremity unfortunately but her right lower extremity is doing much better which is great news. Overall I am very pleased in this regard. With regard to home health we finally got things settled so home health will be coming out to see her this would be very beneficial in my opinion as I think they will be able to help her tremendously with more frequent dressing changes. 12/28-Patient is back here at 1 week visit, with regard to her bilateral lower extremity ulcers, she has a significant degree of lymphedema, significant degree of drainage more from the left and which continues to be an issue, patient had not been using her lymphedema pumps as recommended, she is concerned about amount of drainage and how quickly it soaks the dressing, she is concerned about the lymphedema pump sticking to her skin and was told to use it on the wraps.  She will get in touch with her cardiologist about adjusting her Lasix dose. 09/14/2019 on evaluation today patient appears to be doing  poorly in regard to her lower extremities especially left lower extremity. Unfortunately she is continue to have a significant amount of drainage which is preventing her from being able to heal keeping the wounds very macerated. She also has some warmth to touch in the anterior to medial portion of the left lower extremity compared to the remainder of the wound which does have made somewhat concerned about the possibility of cellulitis. Obviously it can be difficult to determine just strictly lymphedema versus cellulitis but right now I am somewhat concerned. She tells me that she can put a wrap on and at least has to change this twice a day or else she will be walking around leaving puddles of water behind her. Obviously this is fairly significant. She did see her primary care provider they did not see anything going on that they felt needed to be addressed from a diuretic standpoint but nonetheless I am still somewhat concerned based on what we are seeing to be honest. The patient and her husband are likewise concerned. Electronic Signature(s) Signed: 09/14/2019 2:35:18 PM By: Worthy Keeler PA-C Entered By: Worthy Keeler on 09/14/2019 14:35:17 [name] ([id number]) -------------------------------------------------------------------------------- Physical Exam Details Patient Name: [name], [name] Date of Service: 09/14/2019 1:30 PM Medical Record Number: [id number] Patient Account Number: [id number] Date of Birth/Sex: 07/28/1956 (64 y.o. F) Treating RN: Montey Hora Primary Care Provider: Neta Ehlers Other Clinician: Referring Provider: Neta Ehlers Treating Provider/Extender: Melburn Hake, Zyree Traynham Weeks in Treatment: 2 Constitutional Well-nourished and well-hydrated in no acute distress. Respiratory normal breathing without difficulty. Psychiatric this patient is able to make decisions and demonstrates good insight into disease process. Alert and Oriented x 3. pleasant and  cooperative. Notes Patient's wound bed currently showed signs of maceration around the wounds and there was warmth to touch in the anterior/medial portion of the left lower extremity unfortunately. There does not appear to be any signs of active infection systemically although to be honest there may be some local cellulitis based on what I am seeing at this point. The patient states that she is still having a tremendous amount of drainage despite the fact we have been trying to perform compression wraps were not really able to keep the drainage under sufficient control unfortunately. This has escalated even since I initially saw her she tells me. For that reason I really think that the patient needs to likely go to the ER for further evaluation and treatment based on what I am seeing. This is quite severe and coupled with the fact that I think she likely has cellulitis I believe that hospitalization for IV diuretic therapy possibly IV antibiotics would be ideal. Electronic Signature(s) Signed: 09/14/2019 2:37:11 PM By: Worthy Keeler PA-C Entered By: Worthy Keeler on 09/14/2019 14:37:10 [name] ([id number]) -------------------------------------------------------------------------------- Physician Orders Details Patient Name: Martha Siren. Date of Service: 09/14/2019 1:30 PM Medical Record Number: [id number] Patient Account Number: [id number] Date of Birth/Sex: March 04, 1956 (64 y.o. F) Treating RN: Montey Hora Primary Care Provider: Neta Ehlers Other Clinician: Referring Provider: Neta Ehlers Treating Provider/Extender: Melburn Hake, Marykay Mccleod Weeks in Treatment: 2 Verbal / Phone Orders: No Diagnosis Coding ICD-10 Coding Code Description I89.0 Lymphedema, not elsewhere classified L97.812 Non-pressure chronic ulcer of other part of right lower leg with fat layer exposed L97.822 Non-pressure chronic ulcer of other part of left  lower leg with fat layer exposed I10 Essential  (primary) hypertension Z79.01 Long term (current) use of anticoagulants Wound Cleansing Wound #1 Left,Circumferential Lower Leg o Cleanse wound with mild soap and water o May shower with protection. - Do not get your wraps wet Wound #2 Right,Circumferential Lower Leg o Cleanse wound with mild soap and water o May shower with protection. - Do not get your wraps wet Primary Wound Dressing Wound #1 Left,Circumferential Lower Leg o XtraSorb Wound #2 Right,Circumferential Lower Leg o XtraSorb Secondary Dressing Wound #1 Left,Circumferential Lower Leg o ABD pad Wound #2 Right,Circumferential Lower Leg o ABD pad Dressing Change Frequency Wound #1 Left,Circumferential Lower Leg o Change Dressing Monday, Wednesday, Friday Wound #2 Right,Circumferential Lower Leg o Change Dressing Monday, Wednesday, Friday Follow-up Appointments Wound #1 Left,Circumferential Lower Leg o Return Appointment in 1 week. [name] ([id number]) Wound #2 Right,Circumferential Lower Leg o Return Appointment in 1 week. Edema Control Wound #1 Left,Circumferential Lower Leg o 4 Layer Compression System - Bilateral - anchor with unna Wound #2 Right,Circumferential Lower Leg o 4 Layer Compression System - Bilateral - anchor with unna Home Health Wound #1 Left,Circumferential Lower Leg o Continue Home Health Visits - [place] Nurse may visit PRN to address patientos wound care needs. o FACE TO FACE ENCOUNTER: MEDICARE and MEDICAID PATIENTS: I certify that this patient is under my care and that I had a face-to-face encounter that meets the physician face-to-face encounter requirements with this patient on this date. The encounter with the patient was in whole or in part for the following MEDICAL CONDITION: (primary reason for [name]) MEDICAL NECESSITY: I certify, that based on my findings, NURSING services are a medically necessary home health service.  HOME BOUND STATUS: I certify that my clinical findings support that this patient is homebound (i.e., Due to illness or injury, pt requires aid of supportive devices such as crutches, cane, wheelchairs, walkers, the use of special transportation or the assistance of another person to leave their place of residence. There is a normal inability to leave the home and doing so requires considerable and taxing effort. Other absences are for medical reasons / religious services and are infrequent or of short duration when for other reasons). o If current dressing causes regression in wound condition, may D/C ordered dressing product/s and apply Normal Saline Moist Dressing daily until next [place] / Other MD appointment. [place] of regression in wound condition at [phone]. o Please direct any NON-WOUND related issues/requests for orders to patient's Primary Care Physician Wound #2 Right,Circumferential Lower Leg o [place] Visits - [place] Nurse may visit PRN to address patientos wound care needs. o FACE TO FACE ENCOUNTER: MEDICARE and MEDICAID PATIENTS: I certify that this patient is under my care and that I had a face-to-face encounter that meets the physician face-to-face encounter requirements with this patient on this date. The encounter with the patient was in whole or in part for the following MEDICAL CONDITION: (primary reason for [place]) MEDICAL NECESSITY: I certify, that based on my findings, NURSING services are a medically necessary home health service. HOME BOUND STATUS: I certify that my clinical findings support that this patient is homebound (i.e., Due to illness or injury, pt requires aid of supportive devices such as crutches, cane, wheelchairs, walkers, the use of special transportation or the assistance of another person to leave their place of residence. There is a normal inability to leave the  home and doing so requires considerable and taxing effort. Other absences are for medical reasons / religious services and are infrequent or of short duration when for other reasons). o If current dressing causes regression in wound condition, may D/C ordered dressing product/s and apply Normal Saline Moist Dressing daily until next [place] / Other MD appointment. [place] of regression in wound condition at [phone]. o Please direct any NON-WOUND related issues/requests for orders to patient's Primary Care Physician Notes In clinic today - ABD pads, kerlix and ACE Wraps - [name] is going [facility] Electronic Signature(s) Signed: 09/14/2019 4:56:10 PM By: Montey Hora Signed: 09/15/2019 6:44:22 PM By: Worthy Keeler PA-C Entered By: Montey Hora on 09/14/2019 14:31:53 [name] ([id number]) [name], [place] ([id number]) -------------------------------------------------------------------------------- Problem List Details Patient Name: DARITZA, FORNI L. Date of Service: 09/14/2019 1:30 PM Medical Record Number: [id number] Patient Account Number: [id number] Date of Birth/Sex: 1955/10/22 (64 y.o. F) Treating RN: Montey Hora Primary Care Provider: Neta Ehlers Other Clinician: Referring Provider: Neta Ehlers Treating Provider/Extender: Melburn Hake, Teliyah Royal Weeks in Treatment: 2 Active Problems ICD-10 Evaluated Encounter Code Description Active Date Today Diagnosis I89.0 Lymphedema, not elsewhere classified 08/27/2019 No Yes L97.812 Non-pressure chronic ulcer of other part of right lower leg 08/27/2019 No Yes with fat layer exposed L97.822 Non-pressure chronic ulcer of other part of left lower leg with 08/27/2019 No Yes fat layer exposed [place] (primary) hypertension 08/27/2019 No Yes Z79.01 Long term (current) use of anticoagulants 08/27/2019 No Yes Inactive Problems Resolved Problems Electronic Signature(s) Signed:  09/14/2019 2:14:32 PM By: Worthy Keeler PA-C Entered By: Worthy Keeler on 09/14/2019 14:14:31 [name] ([id number]) -------------------------------------------------------------------------------- Progress Note Details Patient Name: Martha Downs L. Date of Service: 09/14/2019 1:30 PM Medical Record Number: [id number] Patient Account Number: [id number] Date of Birth/Sex: 05/13/1956 (64 y.o. F) Treating RN: Montey Hora Primary Care Provider: Neta Ehlers Other Clinician: Referring Provider: Neta Ehlers Treating Provider/Extender: Melburn Hake, Sissy Goetzke Weeks in Treatment: 2 Subjective Chief Complaint Information obtained from Patient Bilateral LE Lymphedema and ulcers History of Present Illness (HPI) 08/27/2019 patient presents today for initial evaluation here in our clinic concerning issues that she has been having with her bilateral lower extremities with lymphedema and weeping. She is previously been a patient over the past year of Dr. Jerilee Hoh at the wound care center in [facility]. With that being said she subsequently was recommended by the lymphedema clinic here in [place] to follow-up with [place] here instead of going back to [place] since they were in closer contact with the clinic here than they are obviously. With that being said the patient states that is why she came her way in order to be evaluated at this point. Fortunately there is no signs of active infection at this time. No fevers, chills, nausea, vomiting, or diarrhea. The patient does have a history of lymphedema, hypertension, long-term use of anticoagulant therapy, and obstructive sleep apnea. During the visit today and from checking into the discharge she was prone to unexpectedly falling asleep. I did discuss this with her daughter as well who states that this is very common for her she states she is not really sure how compliant her mother is with her CPAP machine that may be part of the  issue. The patient did have ABIs performed which revealed have copies of his well today that was on 05/01/2019 and it showed a right ABI of 1.17 with a  TBI of 1.12 and a left ABI of 1.24 with a TBI of 0.96. She has had 2 transfusions in the past 2 weeks apparently as well. 08/31/2019 upon evaluation today patient actually appears to be doing somewhat better in regard to her lower extremity ulcers. She still has a tremendous amount of drainage from the left lower extremity unfortunately but her right lower extremity is doing much better which is great news. Overall I am very pleased in this regard. With regard to home health we finally got things settled so home health will be coming out to see her this would be very beneficial in my opinion as I think they will be able to help her tremendously with more frequent dressing changes. 12/28-Patient is back here at 1 week visit, with regard to her bilateral lower extremity ulcers, she has a significant degree of lymphedema, significant degree of drainage more from the left and which continues to be an issue, patient had not been using her lymphedema pumps as recommended, she is concerned about amount of drainage and how quickly it soaks the dressing, she is concerned about the lymphedema pump sticking to her skin and was told to use it on the wraps. She will get in touch with her cardiologist about adjusting her Lasix dose. 09/14/2019 on evaluation today patient appears to be doing poorly in regard to her lower extremities especially left lower extremity. Unfortunately she is continue to have a significant amount of drainage which is preventing her from being able to heal keeping the wounds very macerated. She also has some warmth to touch in the anterior to medial portion of the left lower extremity compared to the remainder of the wound which does have made somewhat concerned about the possibility of cellulitis. Obviously it can be difficult to determine  just strictly lymphedema versus cellulitis but right now I am somewhat concerned. She tells me that she can put a wrap on and at least has to change this twice a day or else she will be walking around leaving puddles of water behind her. Obviously this is fairly significant. She did see her primary care provider they did not see anything going on that they felt needed to be addressed from a diuretic standpoint but nonetheless I am still somewhat concerned based on what we are seeing to be honest. The patient and her husband are likewise concerned. [name], [name] Objective Constitutional Well-nourished and well-hydrated in no acute distress. Vitals Time Taken: 1:45 PM, Height: 62 in, Weight: 290 lbs, BMI: 53, Temperature: 98.4 F, Pulse: 92 bpm, Respiratory Rate: 20 breaths/min, Blood Pressure: 147/54 mmHg. Respiratory normal breathing without difficulty. Psychiatric this patient is able to make decisions and demonstrates good insight into disease process. Alert and Oriented x 3. pleasant and cooperative. General Notes: Patient's wound bed currently showed signs of maceration around the wounds and there was warmth to touch in the anterior/medial portion of the left lower extremity unfortunately. There does not appear to be any signs of active infection systemically although to be honest there may be some local cellulitis based on what I am seeing at this point. The patient states that she is still having a tremendous amount of drainage despite the fact we have been trying to perform compression wraps were not really able to keep the drainage under sufficient control unfortunately. This has escalated even since I initially saw her she tells me. For that reason I really think that the patient needs to likely go to the ER  for further evaluation and treatment based on what I am seeing. This is quite severe and coupled with the fact that I think she likely has cellulitis I believe  that hospitalization for IV diuretic therapy possibly IV antibiotics would be ideal. Integumentary (Hair, Skin) Wound #1 status is Open. Original cause of wound was Gradually Appeared. The wound is located on the Left,Circumferential Lower Leg. The wound measures 24cm length x 57.5cm width x 0.1cm depth; 1083.849cm^2 area and 108.385cm^3 volume. Wound #2 status is Open. Original cause of wound was Gradually Appeared. The wound is located on the Right,Circumferential Lower Leg. The wound measures 15.5cm length x 35cm width x 0.1cm depth; 426.079cm^2 area and 42.608cm^3 volume. Assessment Active Problems ICD-10 Lymphedema, not elsewhere classified Non-pressure chronic ulcer of other part of right lower leg with fat layer exposed Non-pressure chronic ulcer of other part of left lower leg with fat layer exposed Essential (primary) hypertension Long term (current) use of anticoagulants Plan [name], [name] Wound Cleansing: Wound #1 Left,Circumferential Lower Leg: Cleanse wound with mild soap and water May shower with protection. - Do not get your wraps wet Wound #2 Right,Circumferential Lower Leg: Cleanse wound with mild soap and water May shower with protection. - Do not get your wraps wet Primary Wound Dressing: Wound #1 Left,Circumferential Lower Leg: XtraSorb Wound #2 Right,Circumferential Lower Leg: XtraSorb Secondary Dressing: Wound #1 Left,Circumferential Lower Leg: ABD pad Wound #2 Right,Circumferential Lower Leg: ABD pad Dressing Change Frequency: Wound #1 Left,Circumferential Lower Leg: Change Dressing Monday, Wednesday, Friday Wound #2 Right,Circumferential Lower Leg: Change Dressing Monday, Wednesday, Friday Follow-up Appointments: Wound #1 Left,Circumferential Lower Leg: Return Appointment in 1 week. Wound #2 Right,Circumferential Lower Leg: Return Appointment in 1 week. Edema Control: Wound #1 Left,Circumferential Lower Leg: 4 Layer Compression  System - Bilateral - anchor with unna Wound #2 Right,Circumferential Lower Leg: 4 Layer Compression System - Bilateral - anchor with unna Home Health: Wound #1 Left,Circumferential Lower Leg: Continue Home Health Visits - [place] Nurse may visit PRN to address patient s wound care needs. FACE TO FACE ENCOUNTER: MEDICARE and MEDICAID PATIENTS: I certify that this patient is under my care and that I had a face-to-face encounter that meets the physician face-to-face encounter requirements with this patient on this date. The encounter with the patient was in whole or in part for the following MEDICAL CONDITION: (primary reason for [place]) MEDICAL NECESSITY: I certify, that based on my findings, NURSING services are a medically necessary home health service. HOME BOUND STATUS: I certify that my clinical findings support that this patient is homebound (i.e., Due to illness or injury, pt requires aid of supportive devices such as crutches, cane, wheelchairs, walkers, the use of special transportation or the assistance of another person to leave their place of residence. There is a normal inability to leave the home and doing so requires considerable and taxing effort. Other absences are for medical reasons / religious services and are infrequent or of short duration when for other reasons). If current dressing causes regression in wound condition, may D/C ordered dressing product/s and apply Normal Saline Moist Dressing daily until next [place] / Other MD appointment. [place] of regression in wound condition at [phone]. Please direct any NON-WOUND related issues/requests for orders to patient's Primary Care Physician Wound #2 Right,Circumferential Lower Leg: [place] Visits - [place] Nurse may visit PRN to address patient s wound care needs. FACE TO FACE ENCOUNTER: MEDICARE and MEDICAID PATIENTS:  I certify that this  patient is under my care and that I had a face-to-face encounter that meets the physician face-to-face encounter requirements with this patient on this date. The encounter with the patient was in whole or in part for the following MEDICAL CONDITION: (primary reason for [place]) MEDICAL NECESSITY: I certify, that based on my findings, NURSING services are a medically necessary home health service. HOME BOUND STATUS: I certify that my clinical findings support that this patient is homebound (i.e., Due to illness or injury, pt requires aid of supportive devices such as crutches, cane, wheelchairs, walkers, the use of special transportation or the assistance of another person to leave their place of residence. There is a normal inability to leave the [place] ([id number]) home and doing so requires considerable and taxing effort. Other absences are for medical reasons / religious services and are infrequent or of short duration when for other reasons). If current dressing causes regression in wound condition, may D/C ordered dressing product/s and apply Normal Saline Moist Dressing daily until next Gregory / Other MD appointment. [place] of regression in wound condition at [phone]. Please direct any NON-WOUND related issues/requests for orders to patient's Primary Care Physician General Notes: In clinic today - ABD pads, kerlix and ACE Wraps - [name] is going to [facility] ED 1. Based on what I am seeing at this point we will go ahead and apply absorptive dressings for the patient to get her through until she can get to the ER and get checked in. With that being said obviously she is draining quite significantly on the left lower extremity so will apply quite a few ABD pads and then use Curlex along with an Ace wrap to try to keep some of the drainage under control in the meantime. We have also been using XtraSorb which helps to catch extra of the  drainage but even that has not been enough for her up to this point. 2. Being that there is warmth to touch on the left lower extremity I am concerned about cellulitis on top of the underlying lymphedema that she does have and again I believe that the patient likely is going to require admission to the ER to the hospital for management. This may include IV antibiotic therapy and possibly even IV diuretics try to pull some of the fluid off to help with the excessive drainage that she is experiencing. Overall I am somewhat concerned that we are not really able to manage this in an outpatient setting due to the severity of the drainage. 3. The patient does now have home health but at this point obviously that will be put on hold until she gets out of the hospital. Nonetheless the patient states that she would prefer to go to [facility] emergency department which again that something that is definitely up to her I see no issues in that regard she was very pleased with the care she received at the hospital last time in regard to her leg prior to Thanksgiving of 2020. We will see patient back for reevaluation in 1 week here in the clinic. If anything worsens or changes patient will contact our office for additional recommendations. Electronic Signature(s) Signed: 09/14/2019 2:39:47 PM By: Worthy Keeler PA-C Entered By: Worthy Keeler on 09/14/2019 14:39:47 Mckiernan, [name] ([id number]) -------------------------------------------------------------------------------- SuperBill Details Patient Name: Martha Siren. Date of Service: 09/14/2019 Medical Record Number: [id number] Patient Account Number: [id number] Date of  Birth/Sex: 01/07/1956 (64 y.o. F) Treating RN: Montey Hora Primary Care Provider: Neta Ehlers Other Clinician: Referring Provider: Neta Ehlers Treating Provider/Extender: Melburn Hake, Josaphine Shimamoto Weeks in Treatment: 2 Diagnosis Coding ICD-10 Codes Code Description I89.0  Lymphedema, not elsewhere classified [id number] Non-pressure chronic ulcer of other part of right lower leg with fat layer exposed L97.822 Non-pressure chronic ulcer of other part of left lower leg with fat layer exposed [place] (primary) hypertension Z79.01 Long term (current) use of anticoagulants Facility Procedures CPT4 Code: [id number] Description: 99214 - WOUND CARE VISIT-LEV 4 EST PT Modifier: Quantity: 1 Physician Procedures CPT4 Code Description: [id number] - WC PHYS LEVEL 5 - EST PT ICD-10 Diagnosis Description I89.0 Lymphedema, not elsewhere classified [id number] Non-pressure chronic ulcer of other part of right lower leg wi L97.822 Non-pressure chronic ulcer of other  part of left lower leg wit I10 Essential (primary) hypertension Modifier: th fat layer expo h fat layer expos Quantity: 1 sed ed Electronic Signature(s) Signed: 09/14/2019 2:40:21 PM By: Worthy Keeler PA-C Entered By: Worthy Keeler on 09/14/2019 14:40:21

## 2019-09-14 NOTE — ED Triage Notes (Signed)
Pt here with BLE edema and drainage. States developed a wound in November and has been seen at the wound clinic. Pt states takes a fluid pill but it is not doing enough. Hx lymphedema.

## 2019-09-15 ENCOUNTER — Encounter: Payer: BC Managed Care – PPO | Admitting: Occupational Therapy

## 2019-09-15 DIAGNOSIS — I891 Lymphangitis: Secondary | ICD-10-CM | POA: Diagnosis not present

## 2019-09-15 LAB — BRAIN NATRIURETIC PEPTIDE: B Natriuretic Peptide: 149.4 pg/mL — ABNORMAL HIGH (ref 0.0–100.0)

## 2019-09-15 MED ORDER — ACETAMINOPHEN-CODEINE #3 300-30 MG PO TABS
1.0000 | ORAL_TABLET | Freq: Once | ORAL | Status: AC
  Administered 2019-09-15: 08:00:00 2 via ORAL
  Filled 2019-09-15: qty 2

## 2019-09-15 MED ORDER — METOPROLOL TARTRATE 25 MG PO TABS: 25.0000 mg | ORAL_TABLET | Freq: Two times a day (BID) | ORAL | 0 refills | Status: DC

## 2019-09-15 MED ORDER — CEPHALEXIN 250 MG PO CAPS
500.0000 mg | ORAL_CAPSULE | Freq: Once | ORAL | Status: AC
  Administered 2019-09-15: 08:00:00 500 mg via ORAL
  Filled 2019-09-15: qty 2

## 2019-09-15 MED ORDER — CLINDAMYCIN HCL 150 MG PO CAPS: 300.0000 mg | ORAL_CAPSULE | Freq: Four times a day (QID) | ORAL | 0 refills | Status: AC

## 2019-09-15 MED ORDER — VANCOMYCIN HCL 2000 MG/400ML IV SOLN
2000.0000 mg | Freq: Once | INTRAVENOUS | Status: AC
  Administered 2019-09-15: 10:00:00 2000 mg via INTRAVENOUS
  Filled 2019-09-15 (×2): qty 400

## 2019-09-15 MED ORDER — DOXYCYCLINE HYCLATE 100 MG PO TABS
100.0000 mg | ORAL_TABLET | Freq: Once | ORAL | Status: AC
  Administered 2019-09-15: 100 mg via ORAL
  Filled 2019-09-15: qty 1

## 2019-09-15 MED ORDER — VANCOMYCIN HCL 1500 MG/300ML IV SOLN: 1500.0000 mg | INTRAVENOUS | Status: DC

## 2019-09-15 NOTE — Discharge Instructions (Addendum)
We saw the ER for worsening of the weeping in your legs. Please take the antibiotic prescribed and return to the ER for recheck in 3 days or follow-up with the wound care clinic in 3 to 5 days.  Your primary care doctor can set up IV vancomycin as well if it is needed.  Return to the ER sooner if you start having any fevers, chills, nausea, vomiting.

## 2019-09-15 NOTE — ED Provider Notes (Addendum)
[facility] EMERGENCY DEPARTMENT Provider Note   CSN: [id number] Arrival date & time: 09/14/19  1933     History No chief complaint on file.   [name] is a 64 y.o. female.  HPI     64 year old with history of aortic stenosis, CHF, lymphedema and lower extremity comes with a chief complaint of worsening leg swelling.  Patient is being seen by wound care clinic and has an appointment next Friday.  She reports that over the past few days she started having some weeping in both of her legs.  Left leg is worse than right.  She always has redness in her leg but during her last visit the wound specialist thought that she might be having cellulitis.  She was advised to come to the ER for further evaluation.  Review of system is negative for any nausea, vomiting, fevers, chills.  There is no increased pain in the leg, just increased weeping.  Patient states that in the past she has received IV antibiotics, including infusion at home which helped her.  Her PCP is also adjusting her diuretic to control her leg swelling better.  Past Medical History:  Diagnosis Date  . Aortic stenosis 03/23/2013   Overview:  02/18/13 TTE EF >55%. Critical AS with mean Ao valve gradient of 82 mm Hg. No AI. No MR, PR, mild TR. Estimated RVSP 30 mm Hg.  . Bicuspid aortic valve   . Edema of both legs 02/20/2017  . 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 Heart failure ([place])   . HTN (hypertension) 03/23/2013  . 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 Morbid obesity ([place]) 02/20/2017  . Sleep apnea     Patient Active Problem List   Diagnosis Date Noted  . Long term (current) use of anticoagulants 06/11/2019  . TIA (transient ischemic attack) 06/04/2019  . Transient neurologic deficit 06/03/2019  . Anemia 06/03/2019  . Chronic diastolic CHF (congestive heart failure) ([place]) 06/03/2019  . CAD (coronary artery disease) 06/03/2019  . Essential hypertension 06/03/2019  . Sleep apnea  09/25/2018  . At risk for obstructive sleep apnea 08/14/2018  . Obstructive hypertrophic cardiomyopathy ([place]) 07/10/2018  . Dysfunctional uterine bleeding 04/22/2018  . Edema of both legs 02/20/2017  . 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 Morbid obesity ([place]) 02/20/2017  . Aortic stenosis 03/23/2013  . Hypertensive heart disease with heart failure ([place]) 03/23/2013    Past Surgical History:  Procedure Laterality Date  . BUBBLE STUDY  06/07/2019   Procedure: BUBBLE STUDY;  Surgeon: Buford Dresser, MD;  Location: [facility];  Service: Cardiovascular;;  . CHOLECYSTECTOMY  1983  . RIGHT/LEFT HEART CATH AND CORONARY ANGIOGRAPHY N/A 04/01/2019   Procedure: RIGHT/LEFT HEART CATH AND CORONARY ANGIOGRAPHY;  Surgeon: Lorretta Harp, MD;  Location: [facility] CV LAB;  Service: Cardiovascular;  Laterality: N/A;  . TEE WITHOUT CARDIOVERSION N/A 07/01/2018   Procedure: TRANSESOPHAGEAL ECHOCARDIOGRAM (TEE);  Surgeon: Buford Dresser, MD;  Location: [facility] ENDOSCOPY;  Service: Cardiovascular;  Laterality: N/A;  . TEE WITHOUT CARDIOVERSION N/A 06/07/2019   Procedure: TRANSESOPHAGEAL ECHOCARDIOGRAM (TEE);  Surgeon: Buford Dresser, MD;  Location: [facility] ENDOSCOPY;  Service: Cardiovascular;  Laterality: N/A;  . TISSUE AORTIC VALVE REPLACEMENT       OB History   No obstetric history on file.     Family History  Problem Relation Age of Onset  . Parkinson's disease Father   . Heart attack Mother     Social  History   Tobacco Use  . Smoking status: Never Smoker  . Smokeless tobacco: Never Used  Substance Use Topics  . Alcohol use: No  . Drug use: No    Home Medications Prior to Admission medications   Medication Sig Start Date End Date Taking? Authorizing Provider  camphor-menthol Timoteo Ace) lotion Apply 1 application topically as needed for itching. 06/09/19   Kayleen Memos, DO  clindamycin (CLEOCIN) 150 MG capsule Take 2  capsules (300 mg total) by mouth every 6 (six) hours for 7 days. 09/15/19 09/22/19  Varney Biles, MD  furosemide (LASIX) 40 MG tablet Take 40 mg by mouth every morning. 06/22/19   [provider]  metoprolol tartrate (LOPRESSOR) 25 MG tablet Take 1 tablet (25 mg total) by mouth 2 (two) times daily. 09/15/19   Richardo Priest, MD  potassium chloride (KLOR-CON) 10 MEQ tablet Take 10 mEq by mouth daily. 06/22/19   [provider]  pravastatin (PRAVACHOL) 40 MG tablet Take 1 tablet (40 mg total) by mouth daily at 6 PM. 06/09/19   Kayleen Memos, DO  ramelteon (ROZEREM) 8 MG tablet Take 1 tablet (8 mg total) by mouth at bedtime. 06/21/19   Laurin Coder, MD  warfarin (COUMADIN) 2.5 MG tablet Take 1 tablet (2.5 mg total) by mouth daily. 06/21/19   Richardo Priest, MD    Allergies    Vancomycin  Review of Systems   Review of Systems  Constitutional: Positive for activity change. Negative for fever.  Respiratory: Negative for shortness of breath.   Cardiovascular: Negative for chest pain.  Gastrointestinal: Negative for nausea and vomiting.  Skin: Positive for rash.  All other systems reviewed and are negative.   Physical Exam Updated Vital Signs BP 138/72 (BP Location: Right Arm)   Pulse 90   Temp 97.9 F (36.6 C) (Oral)   Resp 16   Ht 5\' 1"  (1.549 m)   Wt 127.9 kg   SpO2 95%   BMI 53.28 kg/m   Physical Exam Vitals and nursing note reviewed.  Constitutional:      Appearance: She is well-developed.  HENT:     Head: Normocephalic and atraumatic.  Cardiovascular:     Rate and Rhythm: Normal rate.  Pulmonary:     Effort: Pulmonary effort is normal.  Abdominal:     General: Bowel sounds are normal.  Musculoskeletal:     Cervical back: Normal range of motion and neck supple.     Right lower leg: Edema present.     Left lower leg: Edema present.  Skin:    General: Skin is warm and dry.     Findings: Rash present.     Comments: Lower extremity is edematous,  erythematous and there is hyperpigmentation over the anterior aspect of her legs.  Left leg is worse than right as far as the severity of erythema is concerned.  There is some weeping appreciated on the dressing which is mostly clear appearing, however the dressing itself has yellow tinge to it.  No foul smell.  Leg is warm to touch.  Neurological:     Mental Status: She is alert and oriented to person, place, and time.     ED Results / Procedures / Treatments   Labs (all labs ordered are listed, but only abnormal results are displayed) Labs Reviewed  BASIC METABOLIC PANEL - Abnormal; Notable for the following components:      Result Value   Glucose, Bld 129 (*)    All other  components within normal limits  CBC - Abnormal; Notable for the following components:   WBC 15.8 (*)    Hemoglobin 8.8 (*)    HCT 33.0 (*)    MCH 21.4 (*)    MCHC 26.7 (*)    RDW 21.5 (*)    All other components within normal limits  PROTIME-INR - Abnormal; Notable for the following components:   Prothrombin Time 16.1 (*)    INR 1.3 (*)    All other components within normal limits  BRAIN NATRIURETIC PEPTIDE - Abnormal; Notable for the following components:   B Natriuretic Peptide 149.4 (*)    All other components within normal limits    EKG None  Radiology No results found.  Procedures Procedures (including critical care time)  Medications Ordered in ED Medications  sodium chloride flush (NS) 0.9 % injection 3 mL (has no administration in time range)  vancomycin (VANCOREADY) IVPB 1500 mg/300 mL (has no administration in time range)  doxycycline (VIBRA-TABS) tablet 100 mg (100 mg Oral Given 09/15/19 0806)  cephALEXin (KEFLEX) capsule 500 mg (500 mg Oral Given 09/15/19 0806)  acetaminophen-codeine (TYLENOL #3) 300-30 MG per tablet 1-2 tablet (2 tablets Oral Given 09/15/19 0807)  vancomycin (VANCOREADY) IVPB 2000 mg/400 mL (2,000 mg Intravenous New Bag/Given 09/15/19 0940)    ED Course  I have reviewed  the triage vital signs and the nursing notes.  Pertinent labs & imaging results that were available during my care of the patient were reviewed by me and considered in my medical decision making (see chart for details).  Clinical Course as of Sep 14 1229  Wed Sep 15, 2019  1230 Because of cross-reaction between doxycycline /Bactrim and Coumadin, we will discharge patient with clindamycin.    [AN]    Clinical Course User Index [AN] Varney Biles, MD   MDM Rules/Calculators/A&P                      64 year old comes in a chief complaint of leg swelling and increased weeping.  Looks like patient and the wound care clinic is concerned that there could be developing cellulitis.  She was not put on any antibiotics however.  Patient does not have any systemic symptoms of infection, however she states that she had similar problem earlier in the year and was treated with IV antibiotics which helped her.  Pt is not toxic appearing. It is unclear whether the erythema is worse or not, if the swelling is worse or not, however the weeping is new per patient.  She tells [place] that she had similar symptoms that got better with antibiotics as well, therefore our plan will be to treat this like cellulitis.  We will give patient a dose of IV vancomycin here and start her on combination of doxy and Keflex. She has home health that comes to check on her. She has been advised to follow-up with wound care clinic this Friday or coming Monday -but if she is unable to secure an appointment then she needs to come to the ER for recheck.  The thought is that wound care clinic can start her on home infusion of IV vancomycin if needed. If patient's symptoms are getting worse then we could consider admission.    Final Clinical Impression(s) / ED Diagnoses Final diagnoses:  Lymph edema  Cellulitis of lower extremity, unspecified laterality    Rx / DC Orders ED Discharge Orders         Ordered    clindamycin  (  CLEOCIN) 150 MG capsule  Every 6 hours     09/15/19 [place], Luisa Louk, MD 09/15/19 [place], Sarp Vernier, MD 09/15/19 1231

## 2019-09-15 NOTE — ED Notes (Addendum)
Patients left leg and right leg re wrapped. Non adherent pads replaced and ace wrap replaced.

## 2019-09-15 NOTE — Addendum Note (Signed)
Addended by: [name] R on: 09/15/2019 11:12 AM   Modules accepted: Orders

## 2019-09-15 NOTE — Progress Notes (Signed)
Cardiology Office Note:    Date:  09/16/2019   ID:  [name], DOB [birth year], MRN [id number]  PCP:  Martha Sheriff, MD  Cardiologist:  Martha More, MD    Referring MD: Martha Sheriff, MD    ASSESSMENT:    1. H/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   2. Long term (current) use of anticoagulants   3. Obstructive hypertrophic cardiomyopathy ([place])   4. Hypertensive heart disease with heart failure (HCC)   5. Other iron deficiency anemia   6. OSA (obstructive sleep apnea)    PLAN:    In order of problems listed above:  1. Stable continue warfarin goal INR 2.5 with TIA and question of leaflet thrombosis 2. Avoid over diuresis 3. Stable hypertension 4. I advised her to contact Martha. Nelida [name] regarding IV iron 5. Continue positive pressure ventilation at night   Next appointment: See her back in the office in 1 week   Medication Adjustments/Labs and Tests Ordered: Current medicines are reviewed at length with the patient today.  Concerns regarding medicines are outlined above.  No orders of the defined types were placed in this encounter.  No orders of the defined types were placed in this encounter.   Chief Complaint  Patient presents with  . Follow-up    Heart failure    History of Present Illness:    [name] is a 64 y.o. female with a hx of severe symptomatic aortic stenosis with heart failure and underwent 23 mm [place] SAVR on 03/30/13 [name] at [facility] , hypertension and heart failure. Recent TEE showed dynamic LVOT obstruction and normal AVR function. She was  diagnosed with  severe obstructive and central sleep apnea with nocturnal hypoxemia nadir 59% and initiated on CPAP which was changed to BiPAP.  Her other problems include nonhealing leg ulcers and dysfunctional uterine bleeding pending D&C.  She was admitted to [facility] with chest pain 03/30/2019.  Coronary angiography was normal.  She underwent left and right heart  catheterization showing elevated left atrial pressure and increased gradient across her valve.  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 06/09/2019 with TIA underwent evaluation with transesophageal echocardiogram showing normal AVR function but TAVR protocol cardiac CTA implied leaflet thrombosis despite her severe anemia with dysfunctional uterine bleeding she was anticoagulated with warfarin.  Her hospital telemetry showed frequent PACs and brief runs of atrial tachycardia she was also noted to have a patent foramen ovale.  She is to wear a 30-day event monitor as an outpatient.  Her hemoglobin on hospital stable 9.0 and received parenteral iron and diuretic was held. She was last seen 06/27/2018.  Compliance with diet, lifestyle and medications: Yes  She was seen in [place]: ED 09/15/2019 with increasing edema swelling drainage from her legs and was started on antibiotics with clindamycin.   Ref Range & Units 11:07 5 mo ago  B Natriuretic Peptide 0.0 - 100.0 pg/mL 149.4High   95.3 CM    BMP showed a potassium of 3.9 GFR greater than 60 cc she is anemic with a hemoglobin of 8.8 and elevated white count of 15,800  Is a very difficult complicated situation she is seen multiple providers including lymphedema clinic wound clinic GYN oncology primary care physician and recent ED visit.  She has had worsening anemia she has been transfused twice as an outpatient remains severely anemic hemoglobin 8.8  she has obstructive sleep apnea and is compliant with positive pressure.  She has increasing edema and wound drainage and mildly decompensated heart failure.  The difficult issue is she also has dynamic LV outflow tract obstruction and overdiuresis in the past has caused hypotension and severe hypokalemia.  To mitigate her heart failure were going to increase her diuretic and potassium and she  receives home IV antibiotics even give 12 IV Lasix.  I have asked her to get in touch with [name] regarding parental iron for her anemia.  She is not severely short of breath no orthopnea chest pain or syncope.  Recently she is on clindamycin. Past Medical History:  Diagnosis Date  . Aortic stenosis 03/23/2013   Overview:  02/18/13 TTE EF >55%. Critical AS with mean Ao valve gradient of 82 mm Hg. No AI. No MR, PR, mild TR. Estimated RVSP 30 mm Hg.  . Bicuspid aortic valve   . Edema of both legs 02/20/2017  . 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 Heart failure ([place])   . HTN (hypertension) 03/23/2013  . 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 Morbid obesity ([place]) 02/20/2017  . Sleep apnea     Past Surgical History:  Procedure Laterality Date  . BUBBLE STUDY  06/07/2019   Procedure: BUBBLE STUDY;  Surgeon: Martha Dresser, MD;  Location: [place];  Service: Cardiovascular;;  . CHOLECYSTECTOMY  1983  . RIGHT/LEFT HEART CATH AND CORONARY ANGIOGRAPHY N/A 04/01/2019   Procedure: RIGHT/LEFT HEART CATH AND CORONARY ANGIOGRAPHY;  Surgeon: Martha Harp, MD;  Location: [place] CV LAB;  Service: Cardiovascular;  Laterality: N/A;  . TEE WITHOUT CARDIOVERSION N/A 07/01/2018   Procedure: TRANSESOPHAGEAL ECHOCARDIOGRAM (TEE);  Surgeon: Martha Dresser, MD;  Location: [facility] ENDOSCOPY;  Service: Cardiovascular;  Laterality: N/A;  . TEE WITHOUT CARDIOVERSION N/A 06/07/2019   Procedure: TRANSESOPHAGEAL ECHOCARDIOGRAM (TEE);  Surgeon: Martha Dresser, MD;  Location: [facility] ENDOSCOPY;  Service: Cardiovascular;  Laterality: N/A;  . TISSUE AORTIC VALVE REPLACEMENT      Current Medications: Current Meds  Medication Sig  . camphor-menthol (SARNA) lotion Apply 1 application topically as needed for itching.  . clindamycin (CLEOCIN) 150 MG capsule Take 2 capsules (300 mg total) by mouth every 6 (six) hours for 7 days.  . furosemide (LASIX) 40 MG tablet Take 40 mg by mouth every  morning.  . metoprolol tartrate (LOPRESSOR) 25 MG tablet Take 1 tablet (25 mg total) by mouth 2 (two) times daily.  . potassium chloride (KLOR-CON) 10 MEQ tablet Take 10 mEq by mouth daily.  . pravastatin (PRAVACHOL) 40 MG tablet Take 1 tablet (40 mg total) by mouth daily at 6 PM.  . ramelteon (ROZEREM) 8 MG tablet Take 1 tablet (8 mg total) by mouth at bedtime.  Marland Kitchen warfarin (COUMADIN) 2.5 MG tablet Take 1 tablet (2.5 mg total) by mouth daily.     Allergies:   Vancomycin   Social History   Socioeconomic History  . Marital status: Married    Spouse name: Not on file  . Number of children: Not on file  . Years of education: Not on file  . Highest education level: Not on file  Occupational History  . Not on file  Tobacco Use  . Smoking status: Never Smoker  . Smokeless tobacco: Never Used  Substance and Sexual Activity  . Alcohol use: No  . Drug use: No  . Sexual activity: Not Currently  Other Topics Concern  . Not on file  Social History Narrative  .  Not on file   Social Determinants of Health   Financial Resource Strain:   . Difficulty of Paying Living Expenses: Not on file  Food Insecurity:   . Worried About Charity fundraiser in the Last Year: Not on file  . Ran Out of Food in the Last Year: Not on file  Transportation Needs:   . Lack of Transportation (Medical): Not on file  . Lack of Transportation (Non-Medical): Not on file  Physical Activity:   . Days of Exercise per Week: Not on file  . Minutes of Exercise per Session: Not on file  Stress:   . Feeling of Stress : Not on file  Social Connections:   . Frequency of Communication with Friends and Family: Not on file  . Frequency of Social Gatherings with Friends and Family: Not on file  . Attends Religious Services: Not on file  . Active Member of Clubs or Organizations: Not on file  . Attends Archivist Meetings: Not on file  . Marital Status: Not on file     Family History: The patient's family  history includes Heart attack in her mother; Parkinson's disease in her father. ROS:   Please see the history of present illness.    All other systems reviewed and are negative.  EKGs/Labs/Other Studies Reviewed:    The following studies were reviewed today:    Recent Labs: 02/12/2019: NT-Pro BNP 640 06/03/2019: ALT 18 06/05/2019: TSH 5.585 06/08/2019: Magnesium 2.2 09/14/2019: BUN 11; Creatinine, Ser 0.77; Hemoglobin 8.8; Platelets 362; Potassium 3.9; Sodium 137 09/15/2019: B Natriuretic Peptide 149.4  Recent Lipid Panel    Component Value Date/Time   CHOL 123 06/04/2019 0507   CHOL 125 02/12/2019 1116   TRIG 111 06/04/2019 0507   HDL 30 (L) 06/04/2019 0507   HDL 37 (L) 02/12/2019 1116   CHOLHDL 4.1 06/04/2019 0507   VLDL 22 06/04/2019 0507   LDLCALC 71 06/04/2019 0507   LDLCALC 64 02/12/2019 1116    Physical Exam:    VS:  BP (!) 130/56   Pulse (!) 103   Ht 5\' 1"  (1.549 m)   Wt 283 lb 1.9 oz (128.4 kg)   SpO2 94%   BMI 53.50 kg/m     Wt Readings from Last 3 Encounters:  09/16/19 283 lb 1.9 oz (128.4 kg)  09/14/19 282 lb (127.9 kg)  08/26/19 290 lb (131.5 kg)     GEN: Marked obesity BMI exceeds 50 well nourished, well developed in no acute distress HEENT: Normal NECK: No JVD; No carotid bruits LYMPHATICS: No lymphadenopathy CARDIAC: Grade 1/6 to 2/6 aortic outflow murmur no AR does not radiate to the carotids RRR, no murmurs, rubs, gallops RESPIRATORY:  Clear to auscultation without rales, wheezing or rhonchi  ABDOMEN: Soft, non-tender, non-distended MUSCULOSKELETAL: Marked bilateral draining lower extremity edema; No deformity  SKIN: Warm and dry NEUROLOGIC:  Alert and oriented x 3 PSYCHIATRIC:  Normal affect    Signed, Martha More, MD  09/16/2019 3:56 PM    [facility]

## 2019-09-15 NOTE — Progress Notes (Signed)
Pharmacy Antibiotic Note  [name] is a 63 y.o. female admitted on 09/14/2019 with cellulitis.  Pharmacy has been consulted for vancomycin dosing. Pt is afebirle but WBC is elevated. Scr is WNL. Noted allergy to vancomycin but per discussion with MD, she has tolerated vancomycin in the past. Will monitor for reaction.   Plan: Vancomycin 2gm IV x 1 then 1500mg  IV Q24H F/u renal fxn, C&S, clinical status and peak/trough at SS  Height: 5\' 1"  (154.9 cm) Weight: 282 lb (127.9 kg) IBW/kg (Calculated) : 47.8  Temp (24hrs), Avg:98.1 F (36.7 C), Min:97.9 F (36.6 C), Max:98.3 F (36.8 C)  Recent Labs  Lab 09/14/19 2008  WBC 15.8*  CREATININE 0.77    Estimated Creatinine Clearance: 88.3 mL/min (by C-G formula based on SCr of 0.77 mg/dL).    Allergies  Allergen Reactions  . Vancomycin Itching    Antimicrobials this admission: Vanc 1/6>>  Dose adjustments this admission: N/A  Microbiology results: Pending  Thank you for allowing pharmacy to be a part of this patient's care.  Kass Herberger, Rande Lawman 09/15/2019 8:15 AM

## 2019-09-15 NOTE — ED Notes (Signed)
Help wrap patient legs patient is resting with call bell in reach

## 2019-09-15 NOTE — Telephone Encounter (Signed)
Metoprolol tartrate 25 mg twice daily refilled.

## 2019-09-16 ENCOUNTER — Ambulatory Visit (INDEPENDENT_AMBULATORY_CARE_PROVIDER_SITE_OTHER): Payer: Medicare Other | Admitting: Cardiovascular Disease

## 2019-09-16 ENCOUNTER — Ambulatory Visit (INDEPENDENT_AMBULATORY_CARE_PROVIDER_SITE_OTHER): Payer: Medicare Other | Admitting: Cardiology

## 2019-09-16 ENCOUNTER — Other Ambulatory Visit: Payer: Self-pay

## 2019-09-16 ENCOUNTER — Encounter: Payer: Self-pay | Admitting: Cardiology

## 2019-09-16 VITALS — BP 130/56 | HR 103 | Ht 61.0 in | Wt 283.1 lb

## 2019-09-16 DIAGNOSIS — Z7901 Long term (current) use of anticoagulants: Secondary | ICD-10-CM | POA: Diagnosis not present

## 2019-09-16 DIAGNOSIS — G459 Transient cerebral ischemic attack, unspecified: Secondary | ICD-10-CM

## 2019-09-16 DIAGNOSIS — G4733 Obstructive sleep apnea (adult) (pediatric): Secondary | ICD-10-CM

## 2019-09-16 DIAGNOSIS — Z954 Presence of other heart-valve replacement: Secondary | ICD-10-CM

## 2019-09-16 DIAGNOSIS — D508 Other iron deficiency anemias: Secondary | ICD-10-CM

## 2019-09-16 DIAGNOSIS — I110 Hypertensive heart disease with heart failure: Secondary | ICD-10-CM

## 2019-09-16 DIAGNOSIS — I421 Obstructive hypertrophic cardiomyopathy: Secondary | ICD-10-CM | POA: Diagnosis not present

## 2019-09-16 LAB — POCT INR: INR: 1.3 — AB (ref 2.0–3.0)

## 2019-09-16 MED ORDER — FUROSEMIDE 40 MG PO TABS: 40.0000 mg | ORAL_TABLET | Freq: Two times a day (BID) | ORAL | 1 refills | Status: DC

## 2019-09-16 MED ORDER — POTASSIUM CHLORIDE ER 10 MEQ PO TBCR: 10.0000 meq | EXTENDED_RELEASE_TABLET | Freq: Two times a day (BID) | ORAL | 1 refills | Status: DC

## 2019-09-16 NOTE — Patient Instructions (Addendum)
Medication Instructions:  Your physician has recommended you make the following change in your medication:   INCREASE: Furosemide to 40 mg Take 1 tab twice daily INCREASE: Potassium to 10 meq Take 1 tab twice daily  *If you need a refill on your cardiac medications before your next appointment, please call your pharmacy*  Lab Work: None If you have labs (blood work) drawn today and your tests are completely normal, you will receive your results only by: Marland Kitchen MyChart Message (if you have MyChart) OR . A paper copy in the mail If you have any lab test that is abnormal or we need to change your treatment, we will call you to review the results.  Testing/Procedures: None  Follow-Up: At [facility], you and your health needs are our priority.  As part of our continuing mission to provide you with exceptional heart care, we have created designated Provider Care Teams.  These Care Teams include your primary Cardiologist (physician) and Advanced Practice Providers (APPs -  Physician Assistants and Nurse Practitioners) who all work together to provide you with the care you need, when you need it.  Your next appointment:   1 week(s)  The format for your next appointment:   In Person  Provider:   Shirlee More, MD  Other Instructions  Heart Failure  Weigh yourself every morning when you first wake up and record on a calender or note pad, bring this to your office visits. Using a pill tender can help with taking your medications consistently.  Limit your fluid intake to 2 liters daily  Limit your sodium intake to less than 2-3 grams daily. Ask if you need dietary teaching.  If you gain more than 3 pounds (from your dry weight ), double your dose of diuretic for the day.  If you gain more than 5 pounds (from your dry weight), double your dose of lasix and call your heart failure doctor.  Please do not smoke tobacco since it is very bad for your heart.  Please do not drink alcohol  since it can worsen your heart failure.Also avoid OTC nonsteroidal drugs, such as advil, aleve and motrin.  Try to exercise for at least 30 minutes every day because this will help your heart be more efficient. You may be eligible for supervised cardiac rehab, ask your physician.

## 2019-09-16 NOTE — Patient Instructions (Signed)
Description   Received call from Leory Plowman RN [phone]). Advised that pt should take 1.5 tablets today and 1 tablet tomorrow,  then resume taking 1/2 tablet daily except 1 tablet on Sundays and Thursdays. Recheck INR next Wednesday.

## 2019-09-17 ENCOUNTER — Encounter: Payer: BC Managed Care – PPO | Admitting: Occupational Therapy

## 2019-09-17 ENCOUNTER — Encounter: Payer: Medicare Other | Admitting: Physician Assistant

## 2019-09-17 DIAGNOSIS — L97822 Non-pressure chronic ulcer of other part of left lower leg with fat layer exposed: Secondary | ICD-10-CM | POA: Diagnosis not present

## 2019-09-17 NOTE — Progress Notes (Addendum)
[name], [name] ([id number]) Visit Report for 09/17/2019 Chief Complaint Document Details Patient Name: [name], [name]. Date of Service: 09/17/2019 10:15 AM Medical Record Number: [id number] Patient Account Number: [id number] Date of Birth/Sex: 1956/07/13 (64 y.o. F) Treating RN: Montey Hora Primary Care Provider: Neta Ehlers Other Clinician: Referring Provider: Neta Ehlers Treating Provider/Extender: Melburn Hake, Caidin Heidenreich Weeks in Treatment: 3 Information Obtained from: Patient Chief Complaint Bilateral LE Lymphedema and ulcers Electronic Signature(s) Signed: 09/17/2019 10:37:02 AM By: Worthy Keeler PA-C Entered By: Worthy Keeler on 09/17/2019 10:37:01 [name], [name] ([id number]) -------------------------------------------------------------------------------- HPI Details Patient Name: [name] L. Date of Service: 09/17/2019 10:15 AM Medical Record Number: [id number] Patient Account Number: [id number] Date of Birth/Sex: 12-16-1955 (64 y.o. F) Treating RN: Montey Hora Primary Care Provider: Neta Ehlers Other Clinician: Referring Provider: Neta Ehlers Treating Provider/Extender: Melburn Hake, Malasha Kleppe Weeks in Treatment: 3 History of Present Illness HPI Description: 08/27/2019 patient presents today for initial evaluation here in our clinic concerning issues that she has been having with her bilateral lower extremities with lymphedema and weeping. She is previously been a patient over the past year of Dr. Jerilee Hoh at the wound care center in [facility]. With that being said she subsequently was recommended by the lymphedema clinic here in [place] to follow-up with [place] here instead of going back to [place] since they were in closer contact with the clinic here than they are obviously. With that being said the patient states that is why she came her way in order to be evaluated at this point. Fortunately there is no signs of active infection at this time. No  fevers, chills, nausea, vomiting, or diarrhea. The patient does have a history of lymphedema, hypertension, long-term use of anticoagulant therapy, and obstructive sleep apnea. During the visit today and from checking into the discharge she was prone to unexpectedly falling asleep. I did discuss this with her daughter as well who states that this is very common for her she states she is not really sure how compliant her mother is with her CPAP machine that may be part of the issue. The patient did have ABIs performed which revealed have copies of his well today that was on 05/01/2019 and it showed a right ABI of 1.17 with a TBI of 1.12 and a left ABI of 1.24 with a TBI of 0.96. She has had 2 transfusions in the past 2 weeks apparently as well. 08/31/2019 upon evaluation today patient actually appears to be doing somewhat better in regard to her lower extremity ulcers. She still has a tremendous amount of drainage from the left lower extremity unfortunately but her right lower extremity is doing much better which is great news. Overall I am very pleased in this regard. With regard to home health we finally got things settled so home health will be coming out to see her this would be very beneficial in my opinion as I think they will be able to help her tremendously with more frequent dressing changes. 12/28-Patient is back here at 1 week visit, with regard to her bilateral lower extremity ulcers, she has a significant degree of lymphedema, significant degree of drainage more from the left and which continues to be an issue, patient had not been using her lymphedema pumps as recommended, she is concerned about amount of drainage and how quickly it soaks the dressing, she is concerned about the lymphedema pump sticking to her skin and was told to use it on the wraps.  She will get in touch with her cardiologist about adjusting her Lasix dose. 09/14/2019 on evaluation today patient appears to be doing  poorly in regard to her lower extremities especially left lower extremity. Unfortunately she is continue to have a significant amount of drainage which is preventing her from being able to heal keeping the wounds very macerated. She also has some warmth to touch in the anterior to medial portion of the left lower extremity compared to the remainder of the wound which does have made somewhat concerned about the possibility of cellulitis. Obviously it can be difficult to determine just strictly lymphedema versus cellulitis but right now I am somewhat concerned. She tells me that she can put a wrap on and at least has to change this twice a day or else she will be walking around leaving puddles of water behind her. Obviously this is fairly significant. She did see her primary care provider they did not see anything going on that they felt needed to be addressed from a diuretic standpoint but nonetheless I am still somewhat concerned based on what we are seeing to be honest. The patient and her husband are likewise concerned. 09/17/2019 patient is seen today in follow-up I last saw her on Tuesday at that point based on the severity of her lower extremity edema, weeping, and erythema I did send her to the ER for further evaluation and treatment. She subsequently did go for that evaluation. It did appear that based on my review of the notes this is dated 09/14/2019 that the patient did have a BNP of 149.4. Which was notable. Subsequently she was given a dose of IV vancomycin as well as doxycycline and Keflex. It appears however that she was discharged on oral clindamycin 150 mg every 6 hours and to be honest compared to what I saw on Tuesday she actually seems to be doing somewhat better. Fortunately there is no evidence of active infection at this time which is good news. In the interim she did yesterday see her cardiologist as well Dr. Bettina Gavia. That was on 09/16/2019. Upon review of this note it does appear  that his recommendation at this point was to increase the furosemide to 40 mg 2 times a day. He also recommend an increase of potassium 10 mg taken 2 times a day. His other recommendation was that the patient needs to have a follow-up with her hematology oncologist in order to see if they can do anything about iron [place]ki, [place] ([id number]) transfusions for her as unfortunately her iron is still quite low and obviously oral supplementation is not going to be the best way to get this up. Electronic Signature(s) Signed: 09/17/2019 11:31:46 AM By: Worthy Keeler PA-C Entered By: Worthy Keeler on 09/17/2019 11:31:45 Swiatek, [name] ([id number]) -------------------------------------------------------------------------------- Physical Exam Details Patient Name: TAEYA, GARRETTE L. Date of Service: 09/17/2019 10:15 AM Medical Record Number: [id number] Patient Account Number: [id number] Date of Birth/Sex: 04-21-1956 (64 y.o. F) Treating RN: Montey Hora Primary Care Provider: Neta Ehlers Other Clinician: Referring Provider: Neta Ehlers Treating Provider/Extender: Melburn Hake, Sundai Probert Weeks in Treatment: 3 Constitutional Well-nourished and well-hydrated in no acute distress. Respiratory normal breathing without difficulty. Psychiatric this patient is able to make decisions and demonstrates good insight into disease process. Alert and Oriented x 3. pleasant and cooperative. Notes Patient's lower extremities bilaterally appear to be doing much better the right has almost completely resolved as far as drainage is concerned and the left is doing significantly better  from the standpoint of not weeping nearly as significantly as what I last saw even on Tuesday. Overall I am very pleased in this regard I do believe the clindamycin is helping her I also think that increasing her diuretic will likely be of utmost importance. She starting that today. Electronic Signature(s) Signed: 09/17/2019  11:32:19 AM By: Worthy Keeler PA-C Entered By: Worthy Keeler on 09/17/2019 11:32:19 Kessen, [name] ([id number]) -------------------------------------------------------------------------------- Physician Orders Details Patient Name: [name]. Date of Service: 09/17/2019 10:15 AM Medical Record Number: [id number] Patient Account Number: [id number] Date of Birth/Sex: Aug 06, 1956 (64 y.o. 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 - Do not get your wraps wet Wound #2 Right,Circumferential Lower Leg o Cleanse wound with mild soap and water o May shower with protection. - Do not get your wraps wet Primary Wound Dressing Wound #1 Left,Circumferential Lower Leg o XtraSorb o Other: - Adaptic or oil emulsion contact layer to wound bed Wound #2 Right,Circumferential Lower Leg o XtraSorb o Other: - Adaptic or oil emulsion contact layer to wound bed Secondary Dressing Wound #1 Left,Circumferential Lower Leg o ABD pad Wound #2 Right,Circumferential Lower Leg o ABD pad Dressing Change Frequency Wound #1 Left,Circumferential Lower Leg o Change Dressing Monday, Wednesday, Friday Wound #2 Right,Circumferential Lower Leg o Change Dressing Monday, Wednesday, Friday Follow-up Appointments Wound #1  Left,Circumferential Lower Leg Maiden, [place]. ([id number]) o Return Appointment in 1 week. Wound #2 Right,Circumferential Lower Leg o Return Appointment in 1 week. Edema Control Wound #1 Left,Circumferential Lower Leg o 4 Layer Compression System - Bilateral - anchor with unna Wound #2 Right,Circumferential Lower Leg o 4 Layer Compression System - Bilateral - anchor with unna Home Health Wound #1 Left,Circumferential Lower Leg o Continue Home Health Visits - [place] Nurse may visit PRN to address patientos wound care needs. o FACE TO FACE ENCOUNTER: MEDICARE and MEDICAID PATIENTS: I certify that this patient is under my care and that I had a face-to-face encounter that meets the physician face-to-face encounter requirements with this patient on this date. The encounter with the patient was in whole or in part for the following MEDICAL CONDITION: (primary reason for [place]) MEDICAL NECESSITY: I certify, that based on my findings, NURSING services are a medically necessary home health service. HOME BOUND STATUS: I certify that my clinical findings support that this patient is homebound (i.e., Due to illness or injury, pt requires aid of supportive devices such as crutches, cane, wheelchairs, walkers, the use of special transportation or the assistance of another person to leave their place of residence. There is a normal inability to leave the home and doing so requires considerable and taxing effort. Other absences are for medical reasons / religious services and are infrequent or of short duration when for other reasons). o If current dressing causes regression in wound condition, may D/C ordered dressing product/s and apply Normal Saline Moist Dressing daily until next  W[facility] / Other MD appointment. [place] of regression in wound condition at [phone]. o Please direct any NON-WOUND related issues/requests for  orders to patient's Primary Care Physician Wound #2 Right,Circumferential Lower Leg o [place] Visits - [place] Nurse may visit PRN to address patientos wound care needs. o FACE TO FACE ENCOUNTER: MEDICARE and MEDICAID PATIENTS: I certify that this patient is under my care and that I had a face-to-face encounter that meets the physician face-to-face encounter requirements with this patient on this date. The encounter with the patient was in whole or in part for the following MEDICAL CONDITION: (primary reason for [place]) MEDICAL NECESSITY: I certify, that based on my findings, NURSING services are a medically necessary home health service. HOME BOUND STATUS: I certify that my clinical findings support that this patient is homebound (i.e., Due to illness or injury, pt requires aid of supportive devices such as crutches, cane, wheelchairs, walkers, the use of special transportation or the assistance of another person to leave their place of residence. There is a normal inability to leave the home and doing so requires considerable and taxing effort. Other absences are for medical reasons / religious services and are infrequent or of short duration when for other reasons). o If current dressing causes regression in wound condition, may D/C ordered dressing product/s and apply Normal Saline Moist Dressing daily until next [place] / Other MD appointment. [place] of regression in wound condition at [phone]. o Please direct any NON-WOUND related issues/requests for orders to patient's Primary Care Physician Electronic Signature(s) Signed: 09/17/2019 11:35:26 AM By: Worthy Keeler PA-C Signed: 09/17/2019 1:25:05 PM By: Montey Hora Entered By: Montey Hora on 09/17/2019 10:53:29 Brisbin, [name] ([id number]) -------------------------------------------------------------------------------- Problem List Details Patient  Name: JERZIE, SIDMAN L. Date of Service: 09/17/2019 10:15 AM Medical Record Number: [id number] Patient Account Number: [id number] Date of Birth/Sex: 02/13/1956 (64 y.o. 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 other part of right lower leg 08/27/2019 No Yes with fat layer exposed L97.822 Non-pressure chronic ulcer of other part of left lower leg with 08/27/2019 No Yes fat layer exposed [place] (primary) hypertension 08/27/2019 No Yes Z79.01 Long term (current) use of anticoagulants 08/27/2019 No Yes Inactive Problems Resolved Problems Electronic Signature(s) Signed: 09/17/2019 10:36:57 AM By: Worthy Keeler PA-C Entered By: Worthy Keeler on 09/17/2019 10:36:56 Runde, [name] ([id number]) -------------------------------------------------------------------------------- Progress Note Details Patient Name: [name] L. Date of Service: 09/17/2019 10:15 AM Medical Record Number: [id number] Patient Account Number: [id number] Date of Birth/Sex: 02/15/1956 (64 y.o. F) Treating RN: Montey Hora Primary Care Provider: Neta Ehlers Other Clinician: Referring Provider: Neta Ehlers Treating Provider/Extender: Melburn Hake, Nieve Rojero Weeks in Treatment: 3 Subjective Chief Complaint Information obtained from Patient Bilateral LE Lymphedema and ulcers History of Present Illness (HPI) 08/27/2019 patient presents today for initial evaluation here in our clinic concerning issues that she has been having with her bilateral lower extremities with lymphedema and weeping. She is previously been a patient over the past year of Dr. Jerilee Hoh at the wound care  center in [facility]. With that being said she subsequently was recommended by the lymphedema clinic here in [facility]  to follow-up with [place] here instead of going back to [facility] since they were in closer contact with the clinic here than they are obviously. With that being said the patient states that is why she came her way in order to be evaluated at this point. Fortunately there is no signs of active infection at this time. No fevers, chills, nausea, vomiting, or diarrhea. The patient does have a history of lymphedema, hypertension, long-term use of anticoagulant therapy, and obstructive sleep apnea. During the visit today and from checking into the discharge she was prone to unexpectedly falling asleep. I did discuss this with her daughter as well who states that this is very common for her she states she is not really sure how compliant her mother is with her CPAP machine that may be part of the issue. The patient did have ABIs performed which revealed have copies of his well today that was on 05/01/2019 and it showed a right ABI of 1.17 with a TBI of 1.12 and a left ABI of 1.24 with a TBI of 0.96. She has had 2 transfusions in the past 2 weeks apparently as well. 08/31/2019 upon evaluation today patient actually appears to be doing somewhat better in regard to her lower extremity ulcers. She still has a tremendous amount of drainage from the left lower extremity unfortunately but her right lower extremity is doing much better which is great news. Overall I am very pleased in this regard. With regard to home health we finally got things settled so home health will be coming out to see her this would be very beneficial in my opinion as I think they will be able to help her tremendously with more frequent dressing changes. 12/28-Patient is back here at 1 week visit, with regard to her bilateral lower extremity ulcers, she has a significant degree of lymphedema, significant degree of  drainage more from the left and which continues to be an issue, patient had not been using her lymphedema pumps as recommended, she is concerned about amount of drainage and how quickly it soaks the dressing, she is concerned about the lymphedema pump sticking to her skin and was told to use it on the wraps. She will get in touch with her cardiologist about adjusting her Lasix dose. 09/14/2019 on evaluation today patient appears to be doing poorly in regard to her lower extremities especially left lower extremity. Unfortunately she is continue to have a significant amount of drainage which is preventing her from being able to heal keeping the wounds very macerated. She also has some warmth to touch in the anterior to medial portion of the left lower extremity compared to the remainder of the wound which does have made somewhat concerned about the possibility of cellulitis. Obviously it can be difficult to determine just strictly lymphedema versus cellulitis but right now I am somewhat concerned. She tells me that she can put a wrap on and at least has to change this twice a day or else she will be walking around leaving puddles of water behind her. Obviously this is fairly significant. She did see her primary care provider they did not see anything going on that they felt needed to be addressed from a diuretic standpoint but nonetheless I am still somewhat concerned based on what we are seeing to be honest. The patient and her husband are likewise concerned. 09/17/2019 patient is seen today in follow-up I last saw her on Tuesday at that point based on the severity of her lower  extremity edema, weeping, and erythema I did send her to the ER for further evaluation and treatment. She subsequently did go for that evaluation. It did appear that based on my review of the notes this is dated 09/14/2019 that the patient did have a BNP of 149.4. Which was notable. Subsequently she was given a dose of IV vancomycin  as well as doxycycline and Keflex. It appears however that she was discharged on oral clindamycin 150 mg every 6 hours and to be honest compared to what I saw on Tuesday she actually seems to be doing somewhat better. Fortunately there is no evidence of active infection at this time [name], [name]. ([id number]) which is good news. In the interim she did yesterday see her cardiologist as well Dr. Bettina Gavia. That was on 09/16/2019. Upon review of this note it does appear that his recommendation at this point was to increase the furosemide to 40 mg 2 times a day. He also recommend an increase of potassium 10 mg taken 2 times a day. His other recommendation was that the patient needs to have a follow-up with her hematology oncologist in order to see if they can do anything about iron transfusions for her as unfortunately her iron is still quite low and obviously oral supplementation is not going to be the best way to get this up. Objective Constitutional Well-nourished and well-hydrated in no acute distress. Vitals Time Taken: 10:29 AM, Height: 62 in, Weight: 290 lbs, BMI: 53, Temperature: 99.0 F, Pulse: 82 bpm, Respiratory Rate: 16 breaths/min, Blood Pressure: 146/60 mmHg. Respiratory normal breathing without difficulty. Psychiatric this patient is able to make decisions and demonstrates good insight into disease process. Alert and Oriented x 3. pleasant and cooperative. General Notes: Patient's lower extremities bilaterally appear to be doing much better the right has almost completely resolved as far as drainage is concerned and the left is doing significantly better from the standpoint of not weeping nearly as significantly as what I last saw even on Tuesday. Overall I am very pleased in this regard I do believe the clindamycin is helping her I also think that increasing her diuretic will likely be of utmost importance. She starting that today. Integumentary (Hair, Skin) Wound #1 status is  Open. Original cause of wound was Gradually Appeared. The wound is located on the Left,Circumferential Lower Leg. The wound measures 26cm length x 53cm width x 0.1cm depth; 1082.279cm^2 area and 108.228cm^3 volume. There is Fat Layer (Subcutaneous Tissue) Exposed exposed. There is no tunneling or undermining noted. There is a large amount of serous drainage noted. There is medium (34-66%) red granulation within the wound bed. There is a medium (34-66%) amount of necrotic tissue within the wound bed including Adherent Slough. Wound #2 status is Open. Original cause of wound was Gradually Appeared. The wound is located on the Right,Circumferential Lower Leg. The wound measures 17cm length x 38cm width x 0.1cm depth; 507.367cm^2 area and 50.737cm^3 volume. There is Fat Layer (Subcutaneous Tissue) Exposed exposed. There is no tunneling or undermining noted. There is a large amount of serous drainage noted. There is medium (34-66%) red granulation within the wound bed. There is a medium (34-66%) amount of necrotic tissue within the wound bed including Adherent Slough. Assessment Active Problems ICD-10 [name], [name]. ([id number]) Lymphedema, not elsewhere classified Non-pressure chronic ulcer of other part of right lower leg with fat layer exposed Non-pressure chronic ulcer of other part of left lower leg with fat layer exposed Essential (primary) hypertension Long term (current)  use of anticoagulants Procedures Wound #1 Pre-procedure diagnosis of Wound #1 is a Lymphedema located on the Left,Circumferential Lower Leg . There was a Four Layer Compression Therapy Procedure with a pre-treatment ABI of 1.2 by Montey Hora, RN. Post procedure Diagnosis Wound #1: Same as Pre-Procedure Wound #2 Pre-procedure diagnosis of Wound #2 is a Lymphedema located on the Right,Circumferential Lower Leg . There was a Four Layer Compression Therapy Procedure with a pre-treatment ABI of 1.2 by Montey Hora,  RN. Post procedure Diagnosis Wound #2: Same as Pre-Procedure Plan Wound Cleansing: Wound #1 Left,Circumferential Lower Leg: Cleanse wound with mild soap and water May shower with protection. - Do not get your wraps wet Wound #2 Right,Circumferential Lower Leg: Cleanse wound with mild soap and water May shower with protection. - Do not get your wraps wet Primary Wound Dressing: Wound #1 Left,Circumferential Lower Leg: XtraSorb Other: - Adaptic or oil emulsion contact layer to wound bed Wound #2 Right,Circumferential Lower Leg: XtraSorb Other: - Adaptic or oil emulsion contact layer to wound bed Secondary Dressing: Wound #1 Left,Circumferential Lower Leg: ABD pad Wound #2 Right,Circumferential Lower Leg: ABD pad Dressing Change Frequency: Wound #1 Left,Circumferential Lower Leg: Change Dressing Monday, Wednesday, Friday Wound #2 Right,Circumferential Lower Leg: Change Dressing Monday, Wednesday, Friday Follow-up Appointments: Wound #1 Left,Circumferential Lower Leg: Return Appointment in 1 week. Bogusz, [place] ([id number]) Wound #2 Right,Circumferential Lower Leg: Return Appointment in 1 week. Edema Control: Wound #1 Left,Circumferential Lower Leg: 4 Layer Compression System - Bilateral - anchor with unna Wound #2 Right,Circumferential Lower Leg: 4 Layer Compression System - Bilateral - anchor with unna Home Health: Wound #1 Left,Circumferential Lower Leg: Continue Home Health Visits - Agency Nurse may visit PRN to address patient s wound care needs. FACE TO FACE ENCOUNTER: MEDICARE and MEDICAID PATIENTS: I certify that this patient is under my care and that I had a face-to-face encounter that meets the physician face-to-face encounter requirements with this patient on this date. The encounter with the patient was in whole or in part for the following MEDICAL CONDITION: (primary reason for [place]) MEDICAL NECESSITY: I certify, that based on my  findings, NURSING services are a medically necessary home health service. HOME BOUND STATUS: I certify that my clinical findings support that this patient is homebound (i.e., Due to illness or injury, pt requires aid of supportive devices such as crutches, cane, wheelchairs, walkers, the use of special transportation or the assistance of another person to leave their place of residence. There is a normal inability to leave the home and doing so requires considerable and taxing effort. Other absences are for medical reasons / religious services and are infrequent or of short duration when for other reasons). If current dressing causes regression in wound condition, may D/C ordered dressing product/s and apply Normal Saline Moist Dressing daily until next [place] / Other MD appointment. [place] of regression in wound condition at [phone]. Please direct any NON-WOUND related issues/requests for orders to patient's Primary Care Physician Wound #2 Right,Circumferential Lower Leg: [place] Visits - [place] Nurse may visit PRN to address patient s wound care needs. FACE TO FACE ENCOUNTER: MEDICARE and MEDICAID PATIENTS: I certify that this patient is under my care and that I had a face-to-face encounter that meets the physician face-to-face encounter requirements with this patient on this date. The encounter with the patient was in whole or in part for the following MEDICAL CONDITION: (primary reason for [place]) MEDICAL  NECESSITY: I certify, that based on my findings, NURSING services are a medically necessary home health service. HOME BOUND STATUS: I certify that my clinical findings support that this patient is homebound (i.e., Due to illness or injury, pt requires aid of supportive devices such as crutches, cane, wheelchairs, walkers, the use of special transportation or the assistance of another person to leave their place of  residence. There is a normal inability to leave the home and doing so requires considerable and taxing effort. Other absences are for medical reasons / religious services and are infrequent or of short duration when for other reasons). If current dressing causes regression in wound condition, may D/C ordered dressing product/s and apply Normal Saline Moist Dressing daily until next [place] / Other MD appointment. [place] of regression in wound condition at [phone]. Please direct any NON-WOUND related issues/requests for orders to patient's Primary Care Physician 1. I would recommend that the patient continue with the clindamycin which was prescribed at the ED. 2. I am also can recommend that the patient increase her diuretic to Lasix 40 mg 2 times a day along with the potassium 10 mg 2 times a day per the cardiology recommendation. Hopefully this is going to help with improving her overall fluid overload status. She did have an elevated BNP as well when evaluated in the ER. 3. I would recommend that we go ahead and continue with XtraSorb along with the Adaptic in order to help keep it anything from sticking to the wound bed. That is been the major complaint for that reason her husband has been using the wraps and when putting ABD pads on he was putting a layer of Vaseline on the ABD pad to keep her from sticking. I understand why but at the same time we do not really want anything adding extra moisture to the wound bed which is why we can retry the Adaptic. 4. I am also can recommend a continuation of 4 layer compression wrap I really want him to try to leave this in place as long as possible ideally would be for [place] to put this on and then them to have the home health nurse change it only on Monday Wednesday as directed. However to have to change it between a can do so utilizing her lymphedema compression wraps that she has from previously going to the  lymphedema clinic. We will see patient back for reevaluation in 1 week here in the clinic. If anything worsens or changes patient will contact our office for additional recommendations. [name], [name] ([id number]) Electronic Signature(s) Signed: 09/17/2019 11:34:03 AM By: Worthy Keeler PA-C Entered By: Worthy Keeler on 09/17/2019 11:34:03 Saxer, [name] ([id number]) -------------------------------------------------------------------------------- SuperBill Details Patient Name: [name]. Date of Service: 09/17/2019 Medical Record Number: [id number] Patient Account Number: [id number] Date of Birth/Sex: Oct 12, 1955 (64 y.o. 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 Physician Procedures CPT4 Code Description:  [id number] - WC PHYS LEVEL 4 - EST PT ICD-10 Diagnosis Description I89.0 Lymphedema, not elsewhere classified L97.812 Non-pressure chronic ulcer of other part of right lower leg wi L97.822 Non-pressure chronic ulcer of other  part of left lower leg wit I10 Essential (primary) hypertension Modifier: th fat layer expos h fat layer expose Quantity: 1 ed d Electronic Signature(s) Signed: 09/17/2019 11:34:18 AM By: Worthy Keeler PA-C Entered By: Worthy Keeler on 09/17/2019 11:34:18

## 2019-09-17 NOTE — Progress Notes (Signed)
MIKELLA, AMIRI ([id number]) Visit Report for 09/17/2019 Arrival Information Details Patient Name: JAKAYAH, TIMBROOK. Date of Service: 09/17/2019 10:15 AM Medical Record Number: [id number] Patient Account Number: [id number] Date of Birth/Sex: 10-05-1955 (64 y.o. F) Treating RN: Army Melia Primary Care Alaric Gladwin: Neta Ehlers Other Clinician: Referring Keiandre Cygan: Neta Ehlers Treating Fani Rotondo/Extender: Melburn Hake, HOYT Weeks in Treatment: 3 Visit Information History Since Last Visit Added or deleted any medications: No Patient Arrived: Wheel Chair Any new allergies or adverse reactions: No Arrival Time: 10:28 Had a fall or experienced change in No Accompanied By: husband activities of daily living that may affect Transfer Assistance: None risk of falls: Patient Identification Verified: Yes Signs or symptoms of abuse/neglect since last visito No Patient Has Alerts: Yes Hospitalized since last visit: No Patient Alerts: Patient on Blood Thinner Has Dressing in Place as Prescribed: Yes warfarin Pain Present Now: Yes Not Diabetic 04/29/2019 [facility] ABI R)1.17 L)1.24 TBI R) 1.12 L) 0.96 Electronic Signature(s) Signed: 09/17/2019 1:23:06 PM By: Army Melia Entered By: Army Melia on 09/17/2019 10:28:47 Betancur, Dynastee L. ([id number]) -------------------------------------------------------------------------------- Compression Therapy Details Patient Name: [name] Date of Service: 09/17/2019 10:15 AM Medical Record Number: [id number] Patient Account Number: [id number] Date of Birth/Sex: Apr 04, 1956 (64 y.o. F) Treating RN: Montey Hora Primary Care Javaun Dimperio: Neta Ehlers Other Clinician: Referring Kasem Mozer: Neta Ehlers Treating Clairessa Boulet/Extender: Melburn Hake, HOYT Weeks in Treatment: 3 Compression Therapy Performed for Wound Assessment: Wound #1 Left,Circumferential Lower Leg Performed By: Clinician Montey Hora, RN Compression Type: Four Layer Pre Treatment ABI:  1.2 Post Procedure Diagnosis Same as Pre-procedure Electronic Signature(s) Signed: 09/17/2019 1:25:05 PM By: Montey Hora Entered By: Montey Hora on 09/17/2019 10:54:08 Nilan, Ina L. ([id number]) -------------------------------------------------------------------------------- Compression Therapy Details Patient Name: [name] Date of Service: 09/17/2019 10:15 AM Medical Record Number: [id number] Patient Account Number: [id number] Date of Birth/Sex: May 17, 1956 (64 y.o. F) Treating RN: Montey Hora Primary Care Benino Korinek: Neta Ehlers Other Clinician: Referring Nakya Weyand: Neta Ehlers Treating Yeraldy Spike/Extender: Melburn Hake, HOYT Weeks in Treatment: 3 Compression Therapy Performed for Wound Assessment: Wound #2 Right,Circumferential Lower Leg Performed By: Clinician Montey Hora, RN Compression Type: Four Layer Pre Treatment ABI: 1.2 Post Procedure Diagnosis Same as Pre-procedure Electronic Signature(s) Signed: 09/17/2019 1:25:05 PM By: Montey Hora Entered By: Montey Hora on 09/17/2019 10:54:08 Weyand, [name] ([id number]) -------------------------------------------------------------------------------- Encounter Discharge Information Details Patient Name: ELEAN, KARRAS L. Date of Service: 09/17/2019 10:15 AM Medical Record Number: [id number] Patient Account Number: [id number] Date of Birth/Sex: [birth year] (64 y.o. F) Treating RN: Montey Hora Primary Care Marquette Piontek: Neta Ehlers Other Clinician: Referring Karissa Meenan: Neta Ehlers Treating [place] Madding/Extender: Sharalyn Ink in Treatment: 3 Encounter Discharge Information Items Discharge Condition: Stable Ambulatory Status: Wheelchair Discharge Destination: Home Transportation: Private Auto Accompanied By: husband Schedule Follow-up Appointment: Yes Clinical Summary of Care: Electronic Signature(s) Signed: 09/17/2019 1:25:05 PM By: Montey Hora Entered By: Montey Hora on 09/17/2019  10:55:22 Poppen, [name] ([id number]) -------------------------------------------------------------------------------- Lower Extremity Assessment Details Patient Name: [name] Date of Service: 09/17/2019 10:15 AM Medical Record Number: [id number] Patient Account Number: [id number] Date of Birth/Sex: 10-May-1956 (64 y.o. F) Treating RN: Army Melia Primary Care Mintie Witherington: Neta Ehlers Other Clinician: Referring Jaqwon Manfred: Neta Ehlers Treating Taniaya Rudder/Extender: Melburn Hake, HOYT Weeks in Treatment: 3 Edema Assessment Assessed: [Left: No] [Right: No] Edema: [Left: Yes] [Right: Yes] Calf Left: Right: Point of Measurement: 32 cm From Medial Instep 63 cm 53 cm Ankle Left: Right: Point of Measurement: 10 cm From Medial Instep  38 cm 35 cm Vascular Assessment Pulses: Dorsalis Pedis Palpable: [Left:Yes] [Right:Yes] Electronic Signature(s) Signed: 09/17/2019 1:23:06 PM By: Army Melia Entered By: Army Melia on 09/17/2019 10:39:42 Gaida, [place] ([id number]) -------------------------------------------------------------------------------- Multi Wound Chart Details Patient Name: [name] Date of Service: 09/17/2019 10:15 AM Medical Record Number: [id number] Patient Account Number: [id number] Date of Birth/Sex: 30-Aug-1956 (64 y.o. F) Treating RN: Montey Hora Primary Care Vola Beneke: Neta Ehlers Other Clinician: Referring Roisin Mones: Neta Ehlers Treating Pat Elicker/Extender: Melburn Hake, HOYT Weeks in Treatment: 3 Vital Signs Height(in): 62 Pulse(bpm): 28 Weight(lbs): 290 Blood Pressure(mmHg): 146/60 Body Mass Index(BMI): 53 Temperature(F): 99.0 Respiratory Rate 16 (breaths/min): Photos: [N/A:N/A] Wound Location: Left Lower Leg - Right Lower Leg - N/A Circumferential Circumferential Wounding Event: Gradually Appeared Gradually Appeared N/A Primary Etiology: Lymphedema Lymphedema N/A Comorbid History: Anemia, Coronary Artery Anemia, Coronary Artery  N/A Disease, Hypertension Disease, Hypertension Date Acquired: 08/04/2019 08/04/2019 N/A Weeks of Treatment: 3 3 N/A Wound Status: Open Open N/A Measurements L x W x D 26x53x0.1 17x38x0.1 N/A (cm) Area (cm) : [id number] 507.367 N/A Volume (cm) : 108.228 50.737 N/A % Reduction in Area: -5.10% -79.40% N/A % Reduction in Volume: -5.10% -79.40% N/A Classification: Full Thickness Without Partial Thickness N/A Exposed Support Structures Exudate Amount: Large Large N/A Exudate Type: Serous Serous N/A Exudate Color: amber amber N/A Suchecki, Jonnelle L. ([id number]) Granulation Amount: Medium (34-66%) Medium (34-66%) N/A Granulation Quality: Red Red N/A Necrotic Amount: Medium (34-66%) Medium (34-66%) N/A Exposed Structures: Fat Layer (Subcutaneous Fat Layer (Subcutaneous N/A Tissue) Exposed: Yes Tissue) Exposed: Yes Fascia: No Fascia: No Tendon: No Tendon: No Muscle: No Muscle: No Joint: No Joint: No Bone: No Bone: No Epithelialization: None None N/A Treatment Notes Electronic Signature(s) Signed: 09/17/2019 1:25:05 PM By: Montey Hora Entered By: Montey Hora on 09/17/2019 10:46:45 Kusek, [name] ([id number]) -------------------------------------------------------------------------------- [place] Details Patient Name: [name]. Date of Service: 09/17/2019 10:15 AM Medical Record Number: [id number] Patient Account Number: [id number] Date of Birth/Sex: March 23, 1956 (64 y.o. F) Treating RN: Montey Hora Primary Care Damani Kelemen: Neta Ehlers Other Clinician: Referring Samaira Holzworth: Neta Ehlers Treating Roddrick Sharron/Extender: Melburn Hake, HOYT Weeks in Treatment: 3 Active Inactive Abuse / Safety / Falls / Self Care Management Nursing Diagnoses: Potential for falls Goals: Patient will not experience any injury related to falls Date Initiated: 08/27/2019 Target Resolution Date: 11/20/2019 Goal Status: Active Interventions: Assess fall risk on admission  and as needed Notes: Orientation to the Wound Care Program Nursing Diagnoses: Knowledge deficit related to the wound healing center program Goals: Patient/caregiver will verbalize understanding of the [place] Program Date Initiated: 08/27/2019 Target Resolution Date: 11/20/2019 Goal Status: Active Interventions: Provide education on orientation to the wound center Notes: Venous Leg Ulcer Nursing Diagnoses: Potential for venous Insuffiency (use before diagnosis confirmed) Goals: Patient will maintain optimal edema control Date Initiated: 08/27/2019 Target Resolution Date: 11/20/2019 Goal Status: Active Interventions: Assess peripheral edema status every visit. MARIAVICTORIA, ZIKA ([id number]) Notes: Wound/Skin Impairment Nursing Diagnoses: Impaired tissue integrity Goals: Ulcer/skin breakdown will heal within 14 weeks Date Initiated: 08/27/2019 Target Resolution Date: 11/20/2019 Goal Status: Active Interventions: Assess patient/caregiver ability to obtain necessary supplies Assess patient/caregiver ability to perform ulcer/skin care regimen upon admission and as needed Assess ulceration(s) every visit Notes: Electronic Signature(s) Signed: 09/17/2019 1:25:05 PM By: Montey Hora Entered By: Montey Hora on 09/17/2019 10:46:37 Kuw[place]ara, Shanielle L. ([id number]) -------------------------------------------------------------------------------- Pain Assessment Details Patient Name: [name] Date of Service: 09/17/2019 10:15 AM Medical Record Number: [id number] Patient Account Number: [id number]  Date of Birth/Sex: Apr 17, 1956 (64 y.o. F) Treating RN: Army Melia Primary Care Jaynie Hitch: Neta Ehlers Other Clinician: Referring Mateus Rewerts: Neta Ehlers Treating Elzy Tomasello/Extender: Melburn Hake, HOYT Weeks in Treatment: 3 Active Problems Location of Pain Severity and Description of Pain Patient Has Paino Yes Site Locations Pain Location: Pain in Ulcers Rate the  pain. Current Pain Level: 3 Pain Management and Medication Current Pain Management: Electronic Signature(s) Signed: 09/17/2019 1:23:06 PM By: Army Melia Entered By: Army Melia on 09/17/2019 10:28:54 Bechtol, [name] ([id number]) -------------------------------------------------------------------------------- Patient/Caregiver Education Details Patient Name: [name]. Date of Service: 09/17/2019 10:15 AM Medical Record Number: [id number] Patient Account Number: [id number] Date of Birth/Gender: [birth year] (64 y.o. F) Treating RN: Montey Hora Primary Care Physician: Neta Ehlers Other Clinician: Referring Physician: Neta Ehlers Treating Physician/Extender: Sharalyn Ink in Treatment: 3 Education Assessment Education Provided To: Patient and Caregiver Education Topics Provided Wound/Skin Impairment: Handouts: Other: no need for vaseline to wound bed Methods: Explain/Verbal Responses: State content correctly Electronic Signature(s) Signed: 09/17/2019 1:25:05 PM By: Montey Hora Entered By: Montey Hora on 09/17/2019 10:54:39 Beilke, Carmeline L. ([id number]) -------------------------------------------------------------------------------- Wound Assessment Details Patient Name: [name] Date of Service: 09/17/2019 10:15 AM Medical Record Number: [id number] Patient Account Number: [id number] Date of Birth/Sex: 1956/04/17 (64 y.o. F) Treating RN: Army Melia Primary Care Carlisha Wisler: Neta Ehlers Other Clinician: Referring Aniesha Haughn: Neta Ehlers Treating Ahsan Esterline/Extender: Melburn Hake, HOYT Weeks in Treatment: 3 Wound Status Wound Number: 1 Primary Lymphedema Etiology: Wound Location: Left Lower Leg - Circumferential Wound Status: Open Wounding Event: Gradually Appeared Comorbid Anemia, Coronary Artery Disease, Date Acquired: 08/04/2019 History: Hypertension Weeks Of Treatment: 3 Clustered Wound: No Photos Wound Measurements Length: (cm)  26 Width: (cm) 53 Depth: (cm) 0.1 Area: (cm) 1082.279 Volume: (cm) 108.228 % Reduction in Area: -5.1% % Reduction in Volume: -5.1% Epithelialization: None Tunneling: No Undermining: No Wound Description Full Thickness Without Exposed Support Foul Classification: Structures Sloug Exudate Large Amount: Exudate Type: Serous Exudate Color: amber Odor After Cleansing: No h/Fibrino Yes Wound Bed Granulation Amount: Medium (34-66%) Exposed Structure Granulation Quality: Red Fascia Exposed: No Necrotic Amount: Medium (34-66%) Fat Layer (Subcutaneous Tissue) Exposed: Yes Necrotic Quality: Adherent Slough Tendon Exposed: No Muscle Exposed: No Joint Exposed: No Bone Exposed: No Treatment Notes Kontos, [place] ([id number]) Wound #1 (Left, Circumferential Lower Leg) Notes adaptic to wound bed, xtrasorb, abd, 4 layer bilateral with unna to anchor Electronic Signature(s) Signed: 09/17/2019 1:23:06 PM By: Army Melia Entered By: Army Melia on 09/17/2019 10:36:03 Brubacher, [place] ([id number]) -------------------------------------------------------------------------------- Wound Assessment Details Patient Name: [name] Date of Service: 09/17/2019 10:15 AM Medical Record Number: [id number] Patient Account Number: [id number] Date of Birth/Sex: 06/17/1956 (64 y.o. F) Treating RN: Army Melia Primary Care Dewell Monnier: Neta Ehlers Other Clinician: Referring Kay Shippy: Neta Ehlers Treating Derin Matthes/Extender: Melburn Hake, HOYT Weeks in Treatment: 3 Wound Status Wound Number: 2 Primary Lymphedema Etiology: Wound Location: Right Lower Leg - Circumferential Wound Status: Open Wounding Event: Gradually Appeared Comorbid Anemia, Coronary Artery Disease, Date Acquired: 08/04/2019 History: Hypertension Weeks Of Treatment: 3 Clustered Wound: No Photos Wound Measurements Length: (cm) 17 Width: (cm) 38 Depth: (cm) 0.1 Area: (cm) 507.367 Volume: (cm) 50.737 % Reduction  in Area: -79.4% % Reduction in Volume: -79.4% Epithelialization: None Tunneling: No Undermining: No Wound Description Classification: Partial Thickness Foul Odo Exudate Amount: Large Slough/F Exudate Type: Serous Exudate Color: amber r After Cleansing: No ibrino Yes Wound Bed Granulation Amount: Medium (34-66%) Exposed Structure Granulation Quality: Red Fascia  Exposed: No Necrotic Amount: Medium (34-66%) Fat Layer (Subcutaneous Tissue) Exposed: Yes Necrotic Quality: Adherent Slough Tendon Exposed: No Muscle Exposed: No Joint Exposed: No Bone Exposed: No Treatment Notes Wound #2 (Right, Circumferential Lower Leg) Santillano, [place] L. ([id number]) Notes adaptic to wound bed, xtrasorb, abd, 4 layer bilateral with unna to anchor Electronic Signature(s) Signed: 09/17/2019 1:23:06 PM By: Army Melia Entered By: Army Melia on 09/17/2019 10:36:47 Forsee, Helaine L. ([id number]) -------------------------------------------------------------------------------- Vitals Details Patient Name: [name] Date of Service: 09/17/2019 10:15 AM Medical Record Number: [id number] Patient Account Number: [id number] Date of Birth/Sex: [birth year] (64 y.o. F) Treating RN: Army Melia Primary Care Megumi Treaster: Neta Ehlers Other Clinician: Referring Juaquina Machnik: Neta Ehlers Treating Hazell Siwik/Extender: Melburn Hake, HOYT Weeks in Treatment: 3 Vital Signs Time Taken: 10:29 Temperature (F): 99.0 Height (in): 62 Pulse (bpm): 82 Weight (lbs): 290 Respiratory Rate (breaths/min): 16 Body Mass Index (BMI): 53 Blood Pressure (mmHg): 146/60 Reference Range: 80 - 120 mg / dl Electronic Signature(s) Signed: 09/17/2019 1:23:06 PM By: Army Melia Entered By: Army Melia on 09/17/2019 10:29:32

## 2019-09-20 ENCOUNTER — Encounter: Payer: BC Managed Care – PPO | Admitting: Occupational Therapy

## 2019-09-20 ENCOUNTER — Telehealth: Payer: Self-pay | Admitting: Pharmacist

## 2019-09-20 NOTE — Telephone Encounter (Signed)
Patient calling to request instructions for her warfarin dose. Noted INR done January 7th by [facility] and dose by coumadin clinic.  Okay to continue current warfarin dose of 1/2 tablet on Monday and Tuesday. Repeat INR Wednesday 09/22/2019.

## 2019-09-22 ENCOUNTER — Encounter: Payer: BC Managed Care – PPO | Admitting: Occupational Therapy

## 2019-09-23 ENCOUNTER — Ambulatory Visit (INDEPENDENT_AMBULATORY_CARE_PROVIDER_SITE_OTHER): Payer: Medicare Other | Admitting: Pharmacist Clinician (PhC)/ Clinical Pharmacy Specialist

## 2019-09-23 DIAGNOSIS — Z7901 Long term (current) use of anticoagulants: Secondary | ICD-10-CM | POA: Diagnosis not present

## 2019-09-23 DIAGNOSIS — G459 Transient cerebral ischemic attack, unspecified: Secondary | ICD-10-CM | POA: Diagnosis not present

## 2019-09-23 DIAGNOSIS — Z954 Presence of other heart-valve replacement: Secondary | ICD-10-CM

## 2019-09-23 LAB — POCT INR: INR: 2.2 (ref 2.0–3.0)

## 2019-09-23 NOTE — Progress Notes (Signed)
Cardiology Office Note:    Date:  09/24/2019   ID:  [name], DOB Feb 06, 1956, MRN [id number]  PCP:  [name], MD  Cardiologist:  Shirlee More, MD    Referring MD: [name], MD    ASSESSMENT:    1. Hypertensive heart disease with heart failure ([place])   2. Obstructive hypertrophic cardiomyopathy ([place])   3. H/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   4. TIA (transient ischemic attack)   5. Long term (current) use of anticoagulants   6. Other iron deficiency anemia    PLAN:    In order of problems listed above:  1. Improving she is slowly diuresing continue higher dose diuretic recheck renal function proBNP and continue her current diuretic and potassium 2. Stable physical exam is not significant for outflow tract obstruction blood pressure is preserved continue current treatment beta-blocker 3. Stable she is anticoagulated with TIA on warfarin 4. She is anemic likely requires IV iron she will contact her hematology oncology   Next appointment: 1 month   Medication Adjustments/Labs and Tests Ordered: Current medicines are reviewed at length with the patient today.  Concerns regarding medicines are outlined above.  No orders of the defined types were placed in this encounter.  No orders of the defined types were placed in this encounter.   Chief Complaint  Patient presents with   Follow-up    1 wk   Congestive Heart Failure    History of Present Illness:    [name] is a 64 y.o. female with a hx of severe symptomatic aortic stenosis with heart failure and underwent 23 mm [place] SAVR on 03/30/13 Dr Clementeen Graham at [facility] , hypertension and heart failure. Recent TEE showed dynamic LVOT obstruction and normal AVR function. She was  diagnosed with  severe obstructive and central sleep apnea with nocturnal hypoxemia nadir 59% and initiated on CPAP which was changed to BiPAP.  Her other problems include nonhealing leg ulcers and dysfunctional uterine  bleeding pending D&C.  She was admitted to [facility] with chest pain 03/30/2019.  Coronary angiography was normal.  She underwent left and right heart catheterization showing elevated left atrial pressure and increased gradient across her valve.  She previously underwent transesophageal echocardiogram and was found to have dynamic LV outflow tract obstruction.Marland Kitchen She was admitted to [facility] 06/09/2019 with TIA underwent evaluation with transesophageal echocardiogram showing normal AVR function but TAVR protocol cardiac CTA implied leaflet thrombosis despite her severe anemia with dysfunctional uterine bleeding she was anticoagulated with warfarin.  Her hospital telemetry showed frequent PACs and brief runs of atrial tachycardia she was also noted to have a patent foramen ovale She was  last seen 09/16/2019.She was seen in [place]: ED 09/15/2019 with increasing edema swelling drainage from her legs and was started on antibiotics with clindamycin.     Ref Range & Units 11:07 5 mo ago  B Natriuretic Peptide 0.0 - 100.0 pg/mL 149.4High   95.3 CM     BMP showed a potassium of 3.9 GFR greater than 60 cc she is anemic with a hemoglobin of 8.8 and elevated white count of 15,800  Compliance with diet, lifestyle and medications: She is improving he has 1 more day of clindamycin her weight is down a total of 11 pounds 7 since last visit responding to higher dose diuretic and her shortness of breath is improved.  She has had no chest pain syncope or palpitation.  She continues to  have dressings and local wound care.  Complains bitterly of itching pruritus and has been seen by dermatology.  I recommended she try topical Benadryl.  She will continue her higher dose of loop diuretic total pro time next week yesterday 2.3 and will recheck renal function proBNP I asked her to contact her hematology oncology and I think she requires IV iron. Past Medical History:  Diagnosis Date   Aortic stenosis  03/23/2013   Overview:  02/18/13 TTE EF >55%. Critical AS with mean Ao valve gradient of 82 mm Hg. No AI. No MR, PR, mild TR. Estimated RVSP 30 mm Hg.   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   Morbid obesity ([place]) 02/20/2017   Sleep apnea     Past Surgical History:  Procedure Laterality Date   BUBBLE STUDY  06/07/2019   Procedure: BUBBLE STUDY;  Surgeon: Buford Dresser, MD;  Location: [place];  Service: Cardiovascular;;   CHOLECYSTECTOMY  1983   RIGHT/LEFT HEART CATH AND CORONARY ANGIOGRAPHY N/A 04/01/2019   Procedure: RIGHT/LEFT HEART CATH AND CORONARY ANGIOGRAPHY;  Surgeon: Lorretta Harp, MD;  Location: [facility];  Service: Cardiovascular;  Laterality: N/A;   TEE WITHOUT CARDIOVERSION N/A 07/01/2018   Procedure: TRANSESOPHAGEAL ECHOCARDIOGRAM (TEE);  Surgeon: Buford Dresser, MD;  Location: [facility] ENDOSCOPY;  Service: Cardiovascular;  Laterality: N/A;   TEE WITHOUT CARDIOVERSION N/A 06/07/2019   Procedure: TRANSESOPHAGEAL ECHOCARDIOGRAM (TEE);  Surgeon: Buford Dresser, MD;  Location: [facility] ENDOSCOPY;  Service: Cardiovascular;  Laterality: N/A;   TISSUE AORTIC VALVE REPLACEMENT      Current Medications: Current Meds  Medication Sig   Acetaminophen-Codeine (TYLENOL WITH CODEINE #3 PO) Take by mouth once a week.   camphor-menthol (SARNA) lotion Apply 1 application topically as needed for itching.   furosemide (LASIX) 40 MG tablet Take 1 tablet (40 mg total) by mouth 2 (two) times daily.   metoprolol tartrate (LOPRESSOR) 25 MG tablet Take 1 tablet (25 mg total) by mouth 2 (two) times daily.   potassium chloride (KLOR-CON) 10 MEQ tablet Take 1 tablet (10 mEq total) by mouth 2 (two) times daily.   pravastatin (PRAVACHOL) 40 MG tablet Take 1 tablet (40 mg total) by mouth daily at 6 PM.     ramelteon (ROZEREM) 8 MG tablet Take 1 tablet (8 mg total) by mouth at bedtime.   warfarin (COUMADIN) 2.5 MG tablet Take 1 tablet (2.5 mg total) by mouth daily.     Allergies:   Vancomycin   Social History   Socioeconomic History   Marital status: Married    Spouse name: Not on file   Number of children: Not on file   Years of education: Not on file   Highest education level: Not on file  Occupational History   Not on file  Tobacco Use   Smoking status: Never Smoker   Smokeless tobacco: Never Used  Substance and Sexual Activity   Alcohol use: No   Drug use: No   Sexual activity: Not Currently  Other Topics Concern   Not on file  Social History Narrative   Not on file   Social Determinants of Health   Financial Resource Strain:    Difficulty of Paying Living Expenses: Not on file  Food Insecurity:    Worried About [place] in the Last Year: Not on file  Ran Out of Food in the Last Year: Not on file  Transportation Needs:    Lack of Transportation (Medical): Not on file   Lack of Transportation (Non-Medical): Not on file  Physical Activity:    Days of Exercise per Week: Not on file   Minutes of Exercise per Session: Not on file  Stress:    Feeling of Stress : Not on file  Social Connections:    Frequency of Communication with Friends and Family: Not on file   Frequency of Social Gatherings with Friends and Family: Not on file   Attends Religious Services: Not on file   Active Member of Clubs or Organizations: Not on file   Attends Archivist Meetings: Not on file   Marital Status: Not on file     Family History: The patient's family history includes Heart attack in her mother; Parkinson's disease in her father. ROS:   Please see the history of present illness.    All other systems reviewed and are negative.  EKGs/Labs/Other Studies Reviewed:    The following studies were reviewed today:   Recent  Labs: 02/12/2019: NT-Pro BNP 640 06/03/2019: ALT 18 06/05/2019: TSH 5.585 06/08/2019: Magnesium 2.2 09/14/2019: BUN 11; Creatinine, Ser 0.77; Hemoglobin 8.8; Platelets 362; Potassium 3.9; Sodium 137 09/15/2019: B Natriuretic Peptide 149.4  Recent Lipid Panel    Component Value Date/Time   CHOL 123 06/04/2019 0507   CHOL 125 02/12/2019 1116   TRIG 111 06/04/2019 0507   HDL 30 (L) 06/04/2019 0507   HDL 37 (L) 02/12/2019 1116   CHOLHDL 4.1 06/04/2019 0507   VLDL 22 06/04/2019 0507   LDLCALC 71 06/04/2019 0507   LDLCALC 64 02/12/2019 1116    Physical Exam:    VS:  BP 134/74 (BP Location: Left Arm, Patient Position: Sitting, Cuff Size: Large)    Pulse (!) 103    Ht 5\' 1"  (1.549 m)    Wt 279 lb (126.6 kg)    SpO2 92%    BMI 52.72 kg/m     Wt Readings from Last 3 Encounters:  09/24/19 279 lb (126.6 kg)  09/16/19 283 lb 1.9 oz (128.4 kg)  09/14/19 282 lb (127.9 kg)     GEN: Marked obesity BMI exceeds 50 well nourished, well developed in no acute distress HEENT: Normal NECK: No JVD; No carotid bruits LYMPHATICS: No lymphadenopathy CARDIAC: Grade 1/6 to 2/6 midsystolic ejection murmur localized aortic area no aortic regurgitation RRR, no murmurs, rubs, gallops RESPIRATORY:  Clear to auscultation without rales, wheezing or rhonchi  ABDOMEN: Soft, non-tender, non-distended MUSCULOSKELETAL: Severe bilateral pitting edema legs are bandaged  edema; No deformity  SKIN: Warm and dry NEUROLOGIC:  Alert and oriented x 3 PSYCHIATRIC:  Normal affect    Signed, Shirlee More, MD  09/24/2019 2:47 PM    [place]

## 2019-09-24 ENCOUNTER — Encounter: Payer: Self-pay | Admitting: Cardiology

## 2019-09-24 ENCOUNTER — Other Ambulatory Visit: Payer: Self-pay

## 2019-09-24 ENCOUNTER — Encounter: Payer: Medicare Other | Admitting: Physician Assistant

## 2019-09-24 ENCOUNTER — Ambulatory Visit (INDEPENDENT_AMBULATORY_CARE_PROVIDER_SITE_OTHER): Payer: Medicare Other | Admitting: Cardiology

## 2019-09-24 ENCOUNTER — Encounter: Payer: BC Managed Care – PPO | Admitting: Occupational Therapy

## 2019-09-24 VITALS — BP 134/74 | HR 103 | Ht 61.0 in | Wt 279.0 lb

## 2019-09-24 DIAGNOSIS — I110 Hypertensive heart disease with heart failure: Secondary | ICD-10-CM

## 2019-09-24 DIAGNOSIS — I421 Obstructive hypertrophic cardiomyopathy: Secondary | ICD-10-CM

## 2019-09-24 DIAGNOSIS — D508 Other iron deficiency anemias: Secondary | ICD-10-CM

## 2019-09-24 DIAGNOSIS — G459 Transient cerebral ischemic attack, unspecified: Secondary | ICD-10-CM | POA: Diagnosis not present

## 2019-09-24 DIAGNOSIS — L97822 Non-pressure chronic ulcer of other part of left lower leg with fat layer exposed: Secondary | ICD-10-CM | POA: Diagnosis not present

## 2019-09-24 DIAGNOSIS — Z954 Presence of other heart-valve replacement: Secondary | ICD-10-CM

## 2019-09-24 DIAGNOSIS — Z7901 Long term (current) use of anticoagulants: Secondary | ICD-10-CM

## 2019-09-24 NOTE — Patient Instructions (Addendum)
Medication Instructions:   *If you need a refill on your cardiac medications before your next appointment, please call your pharmacy*  Lab Work: BMP and Pro BNP will be drawn by home health If you have labs (blood work) drawn today and your tests are completely normal, you will receive your results only by: Marland Kitchen MyChart Message (if you have MyChart) OR . A paper copy in the mail If you have any lab test that is abnormal or we need to change your treatment, we will call you to review the results.  Testing/Procedures:   Follow-Up: At [facility], you and your health needs are our priority.  As part of our continuing mission to provide you with exceptional heart care, we have created designated Provider Care Teams.  These Care Teams include your primary Cardiologist (physician) and Advanced Practice Providers (APPs -  Physician Assistants and Nurse Practitioners) who all work together to provide you with the care you need, when you need it.  Your next appointment:   4 week(s)  The format for your next appointment:   Either In Person or Virtual  Provider:   Shirlee More, MD  Other Instructions  Stay Well [facility] [phone]

## 2019-09-24 NOTE — Progress Notes (Addendum)
[name] ([id number]) Visit Report for 09/24/2019 Chief Complaint Document Details Patient Name: [name], [name]. Date of Service: 09/24/2019 11:00 AM Medical Record Number: [id number] Patient Account Number: [id number] Date of Birth/Sex: 07/30/1956 (64 y.o. F) Treating RN: Montey Hora Primary Care Provider: Neta Ehlers Other Clinician: Referring Provider: Neta Ehlers Treating Provider/Extender: [name], [name]: 4 Information Obtained from: Patient Chief Complaint Bilateral LE Lymphedema and ulcers Electronic Signature(s) Signed: 09/24/2019 11:13:45 AM By: Worthy Keeler PA-C Entered By: Worthy Keeler on 09/24/2019 11:13:45 [name] ([id number]) -------------------------------------------------------------------------------- HPI Details Patient Name: [name] L. Date of Service: 09/24/2019 11:00 AM Medical Record Number: [id number] Patient Account Number: [id number] Date of Birth/Sex: 1956/03/22 (64 y.o. F) Treating RN: Montey Hora Primary Care Provider: Neta Ehlers Other Clinician: Referring Provider: Neta Ehlers Treating Provider/Extender: [name], Ferd Horrigan Weeks in [name]: 4 History of Present Illness HPI Description: 08/27/2019 patient presents today for initial evaluation here in our clinic concerning issues that she has been having with her bilateral lower extremities with lymphedema and weeping. She is previously been a patient over the past year of Dr. Jerilee Hoh at the wound care center in [facility]. With that being said she subsequently was recommended by the lymphedema clinic here in [place] to follow-up with [place] here instead of going back to [place] since they were in closer contact with the clinic here than they are obviously. With that being said the patient states that is why she came her way in order to be evaluated at this point. Fortunately there is no signs of active infection at this  time. No fevers, chills, nausea, vomiting, or diarrhea. The patient does have a history of lymphedema, hypertension, long-term use of anticoagulant therapy, and obstructive sleep apnea. During the visit today and from checking into the discharge she was prone to unexpectedly falling asleep. I did discuss this with her daughter as well who states that this is very common for her she states she is not really sure how compliant her mother is with her CPAP machine that may be part of the issue. The patient did have ABIs performed which revealed have copies of his well today that was on 05/01/2019 and it showed a right ABI of 1.17 with a TBI of 1.12 and a left ABI of 1.24 with a TBI of 0.96. She has had 2 transfusions in the past 2 weeks apparently as well. 08/31/2019 upon evaluation today patient actually appears to be doing somewhat better in regard to her lower extremity ulcers. She still has a tremendous amount of drainage from the left lower extremity unfortunately but her right lower extremity is doing much better which is great news. Overall I am very pleased in this regard. With regard to home health we finally got things settled so home health will be coming out to see her this would be very beneficial in my opinion as I think they will be able to help her tremendously with more frequent dressing changes. 12/28-Patient is back here at 1 week visit, with regard to her bilateral lower extremity ulcers, she has a significant degree of lymphedema, significant degree of drainage more from the left and which continues to be an issue, patient had not been using her lymphedema pumps as recommended, she is concerned about amount of drainage and how quickly it soaks the dressing, she is concerned about the lymphedema pump sticking to her skin and was told to use it on the wraps.  She will get in touch with her cardiologist about adjusting her Lasix dose. 09/14/2019 on evaluation today patient appears to be  doing poorly in regard to her lower extremities especially left lower extremity. Unfortunately she is continue to have a significant amount of drainage which is preventing her from being able to heal keeping the wounds very macerated. She also has some warmth to touch in the anterior to medial portion of the left lower extremity compared to the remainder of the wound which does have made somewhat concerned about the possibility of cellulitis. Obviously it can be difficult to determine just strictly lymphedema versus cellulitis but right now I am somewhat concerned. She tells me that she can put a wrap on and at least has to change this twice a day or else she will be walking around leaving puddles of water behind her. Obviously this is fairly significant. She did see her primary care provider they did not see anything going on that they felt needed to be addressed from a diuretic standpoint but nonetheless I am still somewhat concerned based on what we are seeing to be honest. The patient and her husband are likewise concerned. 09/17/2019 patient is seen today in follow-up I last saw her on Tuesday at that point based on the severity of her lower extremity edema, weeping, and erythema I did send her to the ER for further evaluation and [name]. She subsequently did go for that evaluation. It did appear that based on my review of the notes this is dated 09/14/2019 that the patient did have a BNP of 149.4. Which was notable. Subsequently she was given a dose of IV vancomycin as well as doxycycline and Keflex. It appears however that she was discharged on oral clindamycin 150 mg every 6 hours and to be honest compared to what I saw on Tuesday she actually seems to be doing somewhat better. Fortunately there is no evidence of active infection at this time which is good news. In the interim she did yesterday see her cardiologist as well Dr. Bettina Gavia. That was on 09/16/2019. Upon review of this note it does  appear that his recommendation at this point was to increase the furosemide to 40 mg 2 times a day. He also recommend an increase of potassium 10 mg taken 2 times a day. His other recommendation was that the patient needs to have a follow-up with her hematology oncologist in order to see if they can do anything about iron transfusions for her as unfortunately her iron is still quite low and obviously oral supplementation is not going to be the best [place], [place] ([id number]) way to get this up. 09/24/2019 upon evaluation today patient appears to be doing quite well with regard to her lower extremities. The right lower extremity in particular has dramatically improved to be honest so has the left in my opinion. There does not appear to be any signs of active infection which is good news. No fevers, chills, nausea, vomiting, or diarrhea. Patient is still taking the clindamycin at this time. Electronic Signature(s) Signed: 09/24/2019 11:47:44 AM By: Worthy Keeler PA-C Entered By: Worthy Keeler on 09/24/2019 11:47:44 Zelenak, Weston [name] ([id number]) -------------------------------------------------------------------------------- Physical Exam Details Patient Name: [name], [name] Date of Service: 09/24/2019 11:00 AM Medical Record Number: [id number] Patient Account Number: [id number] Date of Birth/Sex: 07/08/1956 (63 y.o. F) Treating RN: Montey Hora Primary Care Provider: Neta Ehlers Other Clinician: Referring Provider: Neta Ehlers Treating Provider/Extender: [name], Shrihan Putt Weeks in [name]:  4 Constitutional Well-nourished and well-hydrated in no acute distress. Respiratory normal breathing without difficulty. Psychiatric this patient is able to make decisions and demonstrates good insight into disease process. Alert and Oriented x 3. pleasant and cooperative. Notes Patient's wound bed currently on the left is still showing signs of improvement again this is more generalized  weeping circumferentially but nonetheless I feel like she is making excellent progress. There is no longer consistent dripping from the leg that is making a puddle on the floor the first few times I saw her that was definitely the case. We have made dramatic improvement in my opinion. Electronic Signature(s) Signed: 09/24/2019 11:48:20 AM By: Worthy Keeler PA-C Entered By: Worthy Keeler on 09/24/2019 11:48:20 Sprankle, Weston [name] ([id number]) -------------------------------------------------------------------------------- Physician Orders Details Patient Name: Martha Siren. Date of Service: 09/24/2019 11:00 AM Medical Record Number: [id number] Patient Account Number: [id number] Date of Birth/Sex: 1956-02-18 (64 y.o. 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 - Do not get your wraps wet Wound #2 Right,Circumferential Lower Leg o Cleanse wound with mild soap and water o May shower with protection. - Do not get your wraps wet Primary Wound Dressing Wound #1 Left,Circumferential Lower Leg o Silver Alginate Wound #2 Right,Circumferential Lower Leg o Silver Alginate Secondary Dressing Wound #1 Left,Circumferential Lower Leg o ABD pad o XtraSorb Wound #2 Right,Circumferential Lower Leg o ABD pad o  XtraSorb Dressing Change Frequency Wound #1 Left,Circumferential Lower Leg o Change Dressing Monday, Wednesday, Friday Wound #2 Right,Circumferential Lower Leg o Change Dressing Monday, Wednesday, Friday Follow-up Appointments Wound #1 Left,Circumferential Lower Leg Bednarczyk, [place]. ([id number]) o Return Appointment in 1 week. Wound #2 Right,Circumferential Lower Leg o Return Appointment in 1 week. Edema Control Wound #1 Left,Circumferential Lower Leg o 4 Layer Compression System - Bilateral - anchor with unna on the right Wound #2 Right,Circumferential Lower Leg o 4 Layer Compression System - Bilateral - anchor with unna on the right Home Health Wound #1 Left,Circumferential Lower Leg o [facility] [place] Nurse may visit PRN to address patientos wound care needs. o FACE TO FACE ENCOUNTER: MEDICARE and MEDICAID PATIENTS: I certify that this patient is under my care and that I had a face-to-face encounter that meets the physician face-to-face encounter requirements with this patient on this date. The encounter with the patient was in whole or in part for the following MEDICAL CONDITION: (primary reason for [place]) MEDICAL NECESSITY: I certify, that based on my findings, NURSING services are a medically necessary home health service. HOME BOUND STATUS: I certify that my clinical findings support that this patient is homebound (i.e., Due to illness or injury, pt requires aid of supportive devices such as crutches, cane, wheelchairs, walkers, the use of special transportation or the assistance of another person to leave their place of residence. There is a normal inability to leave the home and doing so requires considerable and taxing effort.  Other absences are for medical reasons / religious services and are infrequent or of short duration when for other reasons). o If current dressing causes regression in wound condition, may  D/C ordered dressing product/s and apply Normal Saline Moist Dressing daily until next [place] / Other MD appointment. [place] of regression in wound condition at [phone]. o Please direct any NON-WOUND related issues/requests for orders to patient's Primary Care Physician Wound #2 Right,Circumferential Lower Leg o [place] [facility] [place] Nurse may visit PRN to address patientos wound care needs. o FACE TO FACE ENCOUNTER: MEDICARE and MEDICAID PATIENTS: I certify that this patient is under my care and that I had a face-to-face encounter that meets the physician face-to-face encounter requirements with this patient on this date. The encounter with the patient was in whole or in part for the following MEDICAL CONDITION: (primary reason for [place]) MEDICAL NECESSITY: I certify, that based on my findings, NURSING services are a medically necessary home health service. HOME BOUND STATUS: I certify that my clinical findings support that this patient is homebound (i.e., Due to illness or injury, pt requires aid of supportive devices such as crutches, cane, wheelchairs, walkers, the use of special transportation or the assistance of another person to leave their place of residence. There is a normal inability to leave the home and doing so requires considerable and taxing effort. Other absences are for medical reasons / religious services and are infrequent or of short duration when for other reasons). o If current dressing causes regression in wound condition, may D/C ordered dressing product/s and apply Normal Saline Moist Dressing daily until next [place] / Other MD appointment. [place] of regression in wound condition at [phone]. o Please direct any NON-WOUND related issues/requests for orders to patient's Primary Care Physician Electronic Signature(s) Signed: 09/24/2019  12:14:17 PM By: Montey Hora Signed: 09/24/2019 12:29:09 PM By: Worthy Keeler PA-C Entered By: Montey Hora on 09/24/2019 11:45:21 Geraghty, Weston [name] ([id number]) -------------------------------------------------------------------------------- Problem List Details Patient Name: CAPUCINE, BUSENBARK L. Date of Service: 09/24/2019 11:00 AM Medical Record Number: [id number] Patient Account Number: [id number] Date of Birth/Sex: 01-14-1956 (64 y.o. F) Treating RN: Montey Hora Primary Care Provider: Neta Ehlers Other Clinician: Referring Provider: Neta Ehlers Treating Provider/Extender: [name], Lucious Zou Weeks in [name]: 4 Active Problems ICD-10 Evaluated Encounter Code Description Active Date Today Diagnosis I89.0 Lymphedema, not elsewhere classified 08/27/2019 No Yes L97.812 Non-pressure chronic ulcer of other part of right lower leg 08/27/2019 No Yes with fat layer exposed L97.822 Non-pressure chronic ulcer of other part of left lower leg with 08/27/2019 No Yes fat layer exposed [place] (primary) hypertension 08/27/2019 No Yes Z79.01 Long term (current) use of anticoagulants 08/27/2019 No Yes Inactive Problems Resolved Problems Electronic Signature(s) Signed: 09/24/2019 11:13:40 AM By: Worthy Keeler PA-C Entered By: Worthy Keeler on 09/24/2019 11:13:39 Bancroft, [name] ([id number]) -------------------------------------------------------------------------------- Progress Note Details Patient Name: [name] L. Date of Service: 09/24/2019 11:00 AM Medical Record Number: [id number] Patient Account Number: [id number] Date of Birth/Sex: August 22, 1956 (64 y.o. F) Treating RN: Montey Hora Primary Care Provider: Neta Ehlers Other Clinician: Referring Provider: Neta Ehlers Treating Provider/Extender: [name], Elison Worrel Weeks in [name]: 4 Subjective Chief Complaint Information obtained from Patient Bilateral LE Lymphedema and ulcers History of Present Illness  (HPI) 08/27/2019 patient presents today for initial evaluation here in our clinic concerning issues that she has been having with her bilateral  lower extremities with lymphedema and weeping. She is previously been a patient over the past year of Dr. Jerilee Hoh at the wound care center in [facility]. With that being said she subsequently was recommended by the lymphedema clinic here in [place] to follow-up with [place] here instead of going back to [place] since they were in closer contact with the clinic here than they are obviously. With that being said the patient states that is why she came her way in order to be evaluated at this point. Fortunately there is no signs of active infection at this time. No fevers, chills, nausea, vomiting, or diarrhea. The patient does have a history of lymphedema, hypertension, long-term use of anticoagulant therapy, and obstructive sleep apnea. During the visit today and from checking into the discharge she was prone to unexpectedly falling asleep. I did discuss this with her daughter as well who states that this is very common for her she states she is not really sure how compliant her mother is with her CPAP machine that may be part of the issue. The patient did have ABIs performed which revealed have copies of his well today that was on 05/01/2019 and it showed a right ABI of 1.17 with a TBI of 1.12 and a left ABI of 1.24 with a TBI of 0.96. She has had 2 transfusions in the past 2 weeks apparently as well. 08/31/2019 upon evaluation today patient actually appears to be doing somewhat better in regard to her lower extremity ulcers. She still has a tremendous amount of drainage from the left lower extremity unfortunately but her right lower extremity is doing much better which is great news. Overall I am very pleased in this regard. With regard to home health we finally got things settled so home health will be coming out to see her this would be very  beneficial in my opinion as I think they will be able to help her tremendously with more frequent dressing changes. 12/28-Patient is back here at 1 week visit, with regard to her bilateral lower extremity ulcers, she has a significant degree of lymphedema, significant degree of drainage more from the left and which continues to be an issue, patient had not been using her lymphedema pumps as recommended, she is concerned about amount of drainage and how quickly it soaks the dressing, she is concerned about the lymphedema pump sticking to her skin and was told to use it on the wraps. She will get in touch with her cardiologist about adjusting her Lasix dose. 09/14/2019 on evaluation today patient appears to be doing poorly in regard to her lower extremities especially left lower extremity. Unfortunately she is continue to have a significant amount of drainage which is preventing her from being able to heal keeping the wounds very macerated. She also has some warmth to touch in the anterior to medial portion of the left lower extremity compared to the remainder of the wound which does have made somewhat concerned about the possibility of cellulitis. Obviously it can be difficult to determine just strictly lymphedema versus cellulitis but right now I am somewhat concerned. She tells me that she can put a wrap on and at least has to change this twice a day or else she will be walking around leaving puddles of water behind her. Obviously this is fairly significant. She did see her primary care provider they did not see anything going on that they felt needed to be addressed from a diuretic standpoint but nonetheless I am  still somewhat concerned based on what we are seeing to be honest. The patient and her husband are likewise concerned. 09/17/2019 patient is seen today in follow-up I last saw her on Tuesday at that point based on the severity of her lower extremity edema, weeping, and erythema I did send her  to the ER for further evaluation and [name]. She subsequently did go for that evaluation. It did appear that based on my review of the notes this is dated 09/14/2019 that the patient did have a BNP of 149.4. Which was notable. Subsequently she was given a dose of IV vancomycin as well as doxycycline and Keflex. It appears however that she was discharged on oral clindamycin 150 mg every 6 hours and to be honest compared to what I saw on Tuesday she actually seems to be doing somewhat better. Fortunately there is no evidence of active infection at this time [name], [name]. ([id number]) which is good news. In the interim she did yesterday see her cardiologist as well Dr. Bettina Gavia. That was on 09/16/2019. Upon review of this note it does appear that his recommendation at this point was to increase the furosemide to 40 mg 2 times a day. He also recommend an increase of potassium 10 mg taken 2 times a day. His other recommendation was that the patient needs to have a follow-up with her hematology oncologist in order to see if they can do anything about iron transfusions for her as unfortunately her iron is still quite low and obviously oral supplementation is not going to be the best way to get this up. 09/24/2019 upon evaluation today patient appears to be doing quite well with regard to her lower extremities. The right lower extremity in particular has dramatically improved to be honest so has the left in my opinion. There does not appear to be any signs of active infection which is good news. No fevers, chills, nausea, vomiting, or diarrhea. Patient is still taking the clindamycin at this time. Objective Constitutional Well-nourished and well-hydrated in no acute distress. Vitals Time Taken: 11:16 AM, Height: 62 in, Weight: 290 lbs, BMI: 53, Temperature: 97.8 F, Pulse: 96 bpm, Respiratory Rate: 18 breaths/min, Blood Pressure: 138/73 mmHg. Respiratory normal breathing without  difficulty. Psychiatric this patient is able to make decisions and demonstrates good insight into disease process. Alert and Oriented x 3. pleasant and cooperative. General Notes: Patient's wound bed currently on the left is still showing signs of improvement again this is more generalized weeping circumferentially but nonetheless I feel like she is making excellent progress. There is no longer consistent dripping from the leg that is making a puddle on the floor the first few times I saw her that was definitely the case. We have made dramatic improvement in my opinion. Integumentary (Hair, Skin) Wound #1 status is Open. Original cause of wound was Gradually Appeared. The wound is located on the Left,Circumferential Lower Leg. The wound measures 24cm length x 54cm width x 0.1cm depth; 1017.876cm^2 area and 101.788cm^3 volume. There is Fat Layer (Subcutaneous Tissue) Exposed exposed. There is a large amount of serous drainage noted. There is medium (34-66%) red granulation within the wound bed. There is a medium (34-66%) amount of necrotic tissue within the wound bed including Adherent Slough. Wound #2 status is Open. Original cause of wound was Gradually Appeared. The wound is located on the Right,Circumferential Lower Leg. The wound measures 16cm length x 46cm width x 0.1cm depth; 578.053cm^2 area and 57.805cm^3 volume. There is Fat Layer (Subcutaneous  Tissue) Exposed exposed. There is a large amount of serous drainage noted. There is medium (34-66%) red granulation within the wound bed. There is a medium (34-66%) amount of necrotic tissue within the wound bed including Adherent Slough. [name], [name] ([id number]) Assessment Active Problems ICD-10 Lymphedema, not elsewhere classified Non-pressure chronic ulcer of other part of right lower leg with fat layer exposed Non-pressure chronic ulcer of other part of left lower leg with fat layer exposed Essential (primary) hypertension Long term  (current) use of anticoagulants Procedures Wound #1 Pre-procedure diagnosis of Wound #1 is a Lymphedema located on the Left,Circumferential Lower Leg . There was a Four Layer Compression Therapy Procedure with a pre-[name] ABI of 1.2 by Montey Hora, RN. Post procedure Diagnosis Wound #1: Same as Pre-Procedure Wound #2 Pre-procedure diagnosis of Wound #2 is a Lymphedema located on the Right,Circumferential Lower Leg . There was a Four Layer Compression Therapy Procedure with a pre-[name] ABI of 1.2 by Montey Hora, RN. Post procedure Diagnosis Wound #2: Same as Pre-Procedure Plan Wound Cleansing: Wound #1 Left,Circumferential Lower Leg: Cleanse wound with mild soap and water May shower with protection. - Do not get your wraps wet Wound #2 Right,Circumferential Lower Leg: Cleanse wound with mild soap and water May shower with protection. - Do not get your wraps wet Primary Wound Dressing: Wound #1 Left,Circumferential Lower Leg: Silver Alginate Wound #2 Right,Circumferential Lower Leg: Silver Alginate Secondary Dressing: Wound #1 Left,Circumferential Lower Leg: ABD pad XtraSorb Wound #2 Right,Circumferential Lower Leg: ABD pad XtraSorb Dressing Change Frequency: Wound #1 Left,Circumferential Lower Leg: Change Dressing Monday, Wednesday, Friday [name], [name] Wound #2 Right,Circumferential Lower Leg: Change Dressing Monday, Wednesday, Friday Follow-up Appointments: Wound #1 Left,Circumferential Lower Leg: Return Appointment in 1 week. Wound #2 Right,Circumferential Lower Leg: Return Appointment in 1 week. Edema Control: Wound #1 Left,Circumferential Lower Leg: 4 Layer Compression System - Bilateral - anchor with unna on the right Wound #2 Right,Circumferential Lower Leg: 4 Layer Compression System - Bilateral - anchor with unna on the right Home Health: Wound #1 Left,Circumferential Lower Leg: Continue Home Health Visits - [place]  Nurse may visit PRN to address patient s wound care needs. FACE TO FACE ENCOUNTER: MEDICARE and MEDICAID PATIENTS: I certify that this patient is under my care and that I had a face-to-face encounter that meets the physician face-to-face encounter requirements with this patient on this date. The encounter with the patient was in whole or in part for the following MEDICAL CONDITION: (primary reason for [place]) MEDICAL NECESSITY: I certify, that based on my findings, NURSING services are a medically necessary home health service. HOME BOUND STATUS: I certify that my clinical findings support that this patient is homebound (i.e., Due to illness or injury, pt requires aid of supportive devices such as crutches, cane, wheelchairs, walkers, the use of special transportation or the assistance of another person to leave their place of residence. There is a normal inability to leave the home and doing so requires considerable and taxing effort. Other absences are for medical reasons / religious services and are infrequent or of short duration when for other reasons). If current dressing causes regression in wound condition, may D/C ordered dressing product/s and apply Normal Saline Moist Dressing daily until next [place] / Other MD appointment. [place] of regression in wound condition at [phone]. Please direct any NON-WOUND related issues/requests for orders to patient's Primary Care Physician Wound #2 Right,Circumferential Lower Leg: [place] Visits - [facility]  Home Health Nurse may visit PRN to address patient s wound care needs. FACE TO FACE ENCOUNTER: MEDICARE and MEDICAID PATIENTS: I certify that this patient is under my care and that I had a face-to-face encounter that meets the physician face-to-face encounter requirements with this patient on this date. The encounter with the patient was in whole or in part for the following MEDICAL  CONDITION: (primary reason for [place]) MEDICAL NECESSITY: I certify, that based on my findings, NURSING services are a medically necessary home health service. HOME BOUND STATUS: I certify that my clinical findings support that this patient is homebound (i.e., Due to illness or injury, pt requires aid of supportive devices such as crutches, cane, wheelchairs, walkers, the use of special transportation or the assistance of another person to leave their place of residence. There is a normal inability to leave the home and doing so requires considerable and taxing effort. Other absences are for medical reasons / religious services and are infrequent or of short duration when for other reasons). If current dressing causes regression in wound condition, may D/C ordered dressing product/s and apply Normal Saline Moist Dressing daily until next [place] / Other MD appointment. [place] of regression in wound condition at [phone]. Please direct any NON-WOUND related issues/requests for orders to patient's Primary Care Physician 1. At this time I think that the goal is can be for [place] to try to keep her in the wraps that we put on as long as possible. I really think we need to try to not remove those we will add silver alginate to the left lower extremity which I think will benefit both areas in fact on the left and the right. With that being said I still think that the 4 layer compression wrap is good to give her more benefit than the lymphedema wraps. 2. The good news is she is not weeping nearly as much her cardiologist did increase her diuretics and that is been beneficial as well. Nonetheless I am very pleased with how things seem to be going at this time. Overall the patient is likewise pleased as is her husband. 3. I am also recommending at this time based on what I am seeing that we go ahead and have her continue with elevation of the legs. Home health  will still be coming out to replace the wraps on Monday and Wednesday we see her on Friday. [place], [place] ([id number]) We will see patient back for reevaluation in 1 week here in the clinic. If anything worsens or changes patient will contact our office for additional recommendations. Electronic Signature(s) Signed: 09/24/2019 11:49:24 AM By: Worthy Keeler PA-C Entered By: Worthy Keeler on 09/24/2019 11:49:24 [place]on, Weston [name] ([id number]) -------------------------------------------------------------------------------- SuperBill Details Patient Name: Martha Siren. Date of Service: 09/24/2019 Medical Record Number: [id number] Patient Account Number: [id number] Date of Birth/Sex: Jan 08, 1956 (64 y.o. 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 Physician Procedures CPT4 Code Description: [id number] - WC PHYS LEVEL 3 - EST PT ICD-10 Diagnosis Description I89.0 Lymphedema, not elsewhere classified [id number] Non-pressure chronic ulcer of other part of right lower leg wi L97.822 Non-pressure chronic ulcer of other  part of left lower leg wit I10 Essential (primary) hypertension Modifier: th fat layer expos h fat layer expose Quantity: 1 ed d Electronic Signature(s) Signed: 09/24/2019 11:49:40 AM By: Worthy Keeler PA-C Entered By: Worthy Keeler on 09/24/2019 11:49:40

## 2019-09-27 ENCOUNTER — Encounter: Payer: BC Managed Care – PPO | Admitting: Occupational Therapy

## 2019-09-29 ENCOUNTER — Telehealth: Payer: Self-pay | Admitting: Cardiology

## 2019-09-29 ENCOUNTER — Encounter: Payer: BC Managed Care – PPO | Admitting: Occupational Therapy

## 2019-09-29 LAB — POCT INR: INR: 1.6 — AB (ref 2.0–3.0)

## 2019-09-29 NOTE — Telephone Encounter (Signed)
[place] Nurse with [facility] was calling to document some PT/ INR Results for the patient  INR: 1.6 PT 19.5  These levels were drawn an hour ago. Please let the patient know directly if any changes need to be made to the patient's medications. Stephanie's Contact information , if needed for further clarification is [phone]

## 2019-09-30 NOTE — Telephone Encounter (Signed)
Please refer to the anticoagulation clinic to manage her warfarin

## 2019-10-01 ENCOUNTER — Other Ambulatory Visit: Payer: Self-pay

## 2019-10-01 ENCOUNTER — Encounter: Payer: Medicare Other | Admitting: Physician Assistant

## 2019-10-01 ENCOUNTER — Encounter: Payer: BC Managed Care – PPO | Admitting: Occupational Therapy

## 2019-10-01 ENCOUNTER — Ambulatory Visit (INDEPENDENT_AMBULATORY_CARE_PROVIDER_SITE_OTHER): Payer: Medicare Other | Admitting: Pharmacist Clinician (PhC)/ Clinical Pharmacy Specialist

## 2019-10-01 DIAGNOSIS — Z7901 Long term (current) use of anticoagulants: Secondary | ICD-10-CM | POA: Diagnosis not present

## 2019-10-01 DIAGNOSIS — Z954 Presence of other heart-valve replacement: Secondary | ICD-10-CM

## 2019-10-01 DIAGNOSIS — G459 Transient cerebral ischemic attack, unspecified: Secondary | ICD-10-CM | POA: Diagnosis not present

## 2019-10-01 DIAGNOSIS — L97822 Non-pressure chronic ulcer of other part of left lower leg with fat layer exposed: Secondary | ICD-10-CM | POA: Diagnosis not present

## 2019-10-01 NOTE — Progress Notes (Addendum)
Martha, [name] ([id number]) Visit Report for 10/01/2019 Arrival Information Details Patient Name: [name], [name]. Date of Service: 10/01/2019 11:00 AM Medical Record Number: [id number] Patient Account Number: [id number] Date of Birth/Sex: 27-Jan-1956 (64 y.o. F) Treating RN: Cornell Barman Primary Care Jerry Haugen: Neta Ehlers Other Clinician: Referring Jyair Kiraly: Neta Ehlers Treating Rosann Gorum/Extender: Melburn Hake, HOYT Weeks in Treatment: 5 Visit Information History Since Last Visit Added or deleted any medications: No Patient Arrived: Ambulatory Any new allergies or adverse reactions: No Arrival Time: 11:15 Had a fall or experienced change in No Accompanied By: husband activities of daily living that may affect Transfer Assistance: None risk of falls: Patient Identification Verified: Yes Signs or symptoms of abuse/neglect since last visito No Secondary Verification Process Yes Hospitalized since last visit: No Completed: Implantable device outside of the clinic excluding No Patient Has Alerts: Yes cellular tissue based products placed in the center Patient Alerts: Patient on Blood since last visit: Thinner Has Dressing in Place as Prescribed: Yes warfarin Has Compression in Place as Prescribed: Yes Not Diabetic 04/29/2019 [facility] Pain Present Now: Yes ABI R)1.17 L)1.24 TBI R) 1.12 L) 0.96 Electronic Signature(s) Signed: 10/01/2019 4:22:15 PM By: Lorine Bears RCP, RRT, CHT Entered By: Lorine Bears on 10/01/2019 11:15:49 [place], [place] ([id number]) -------------------------------------------------------------------------------- Compression Therapy Details Patient Name: Martha Downs L. Date of Service: 10/01/2019 11:00 AM Medical Record Number: [id number] Patient Account Number: [id number] Date of Birth/Sex: 1955/09/14 (64 y.o. F) Treating RN: Army Melia Primary Care Beth Goodlin: Neta Ehlers Other Clinician: Referring Anjelina Dung: Neta Ehlers Treating Joellen Tullos/Extender: Melburn Hake, HOYT Weeks in Treatment: 5 Compression Therapy Performed for Wound Assessment: Wound #1 Left,Circumferential Lower Leg Performed By: Clinician Army Melia, RN Compression Type: Four Layer Post Procedure Diagnosis Same as Pre-procedure Electronic Signature(s) Signed: 10/04/2019 12:59:59 PM By: Army Melia Entered By: Army Melia on 10/01/2019 11:42:05 [place], Winslow L. ([id number]) -------------------------------------------------------------------------------- Compression Therapy Details Patient Name: Martha Downs L. Date of Service: 10/01/2019 11:00 AM Medical Record Number: [id number] Patient Account Number: [id number] Date of Birth/Sex: 05-15-1956 (64 y.o. F) Treating RN: Army Melia Primary Care Oluchi Pucci: Neta Ehlers Other Clinician: Referring Benyamin Jeff: Neta Ehlers Treating Jahzara Slattery/Extender: Melburn Hake, HOYT Weeks in Treatment: 5 Compression Therapy Performed for Wound Assessment: Wound #2 Right,Circumferential Lower Leg Performed By: Clinician Army Melia, RN Compression Type: Four Layer Post Procedure Diagnosis Same as Pre-procedure Electronic Signature(s) Signed: 10/04/2019 12:59:59 PM By: Army Melia Entered By: Army Melia on 10/01/2019 11:42:05 [place]kin, [name] ([id number]) -------------------------------------------------------------------------------- Encounter Discharge Information Details Patient Name: Martha Downs L. Date of Service: 10/01/2019 11:00 AM Medical Record Number: [id number] Patient Account Number: [id number] Date of Birth/Sex: [birth year] (64 y.o. F) Treating RN: Army Melia Primary Care Shanea Karney: Neta Ehlers Other Clinician: Referring Amer Alcindor: Neta Ehlers Treating Davyd Podgorski/Extender: Melburn Hake, HOYT Weeks in Treatment: 5 Encounter Discharge Information Items Discharge Condition: Stable Ambulatory Status: Ambulatory Discharge Destination: Home Transportation: Private  Auto Accompanied By: husband Schedule Follow-up Appointment: Yes Clinical Summary of Care: Electronic Signature(s) Signed: 10/04/2019 12:59:59 PM By: Army Melia Entered By: Army Melia on 10/01/2019 11:46:46 [place], [place] ([id number]) -------------------------------------------------------------------------------- Lower Extremity Assessment Details Patient Name: Martha Downs L. Date of Service: 10/01/2019 11:00 AM Medical Record Number: [id number] Patient Account Number: [id number] Date of Birth/Sex: 09-15-1955 (64 y.o. F) Treating RN: Cornell Barman Primary Care Pranavi Aure: Neta Ehlers Other Clinician: Referring Vonnie Spagnolo: Neta Ehlers Treating Ieesha Abbasi/Extender: Melburn Hake, HOYT Weeks in Treatment: 5 Edema Assessment Assessed: [Left: No] [Right: No] [Left: Edema] [Right: :]  Calf Left: Right: Point of Measurement: 32 cm From Medial Instep 62.4 cm 55 cm Ankle Left: Right: Point of Measurement: 10 cm From Medial Instep 31.8 cm 33 cm Vascular Assessment Pulses: Dorsalis Pedis Palpable: [Left:Yes] [Right:Yes] Notes Left leg red and weepy; right small redness and 2 small weeping areas Electronic Signature(s) Signed: 10/01/2019 5:05:43 PM By: Gretta Cool, BSN, RN, CWS, Kim RN, BSN Entered By: Gretta Cool, BSN, RN, CWS, Kim on 10/01/2019 11:28:25 [place], [name] ([id number]) -------------------------------------------------------------------------------- Multi Wound Chart Details Patient Name: [name]. Date of Service: 10/01/2019 11:00 AM Medical Record Number: [id number] Patient Account Number: [id number] Date of Birth/Sex: [birth year] (64 y.o. F) Treating RN: Army Melia Primary Care Kealie Barrie: Neta Ehlers Other Clinician: Referring Latrish Mogel: Neta Ehlers Treating Mayme Profeta/Extender: Melburn Hake, HOYT Weeks in Treatment: 5 Vital Signs Height(in): 62 Pulse(bpm): 32 Weight(lbs): 290 Blood Pressure(mmHg): 104/50 Body Mass Index(BMI): 53 Temperature(F):  97.8 Respiratory Rate 18 (breaths/min): Photos: [N/A:N/A] Wound Location: Left Lower Leg - Right Lower Leg - N/A Circumferential Circumferential Wounding Event: Gradually Appeared Gradually Appeared N/A Primary Etiology: Lymphedema Lymphedema N/A Comorbid History: Anemia, Coronary Artery Anemia, Coronary Artery N/A Disease, Hypertension Disease, Hypertension Date Acquired: 08/04/2019 08/04/2019 N/A Weeks of Treatment: 5 5 N/A Wound Status: Open Open N/A Measurements L x W x D 29x33x0.1 10x26x0.1 N/A (cm) Area (cm) : 751.626 204.204 N/A Volume (cm) : 75.163 20.42 N/A % Reduction in Area: 27.00% 27.80% N/A % Reduction in Volume: 27.00% 27.80% N/A Classification: Partial Thickness Partial Thickness N/A Exudate Amount: Large Large N/A Exudate Type: Serous Serous N/A Exudate Color: amber amber N/A Wound Margin: Indistinct, nonvisible Indistinct, nonvisible N/A Granulation Amount: Large (67-100%) Medium (34-66%) N/A Granulation Quality: Red Red N/A Necrotic Amount: None Present (0%) Medium (34-66%) N/A Exposed Structures: Fat Layer (Subcutaneous Fat Layer (Subcutaneous N/A Tissue) Exposed: Yes Tissue) Exposed: Yes Fascia: No Fascia: No Tendon: No Tendon: No Muscle: No Muscle: No Savastano, Lori L. ([id number]) Joint: No Joint: No Bone: No Bone: No Epithelialization: None None N/A Treatment Notes Electronic Signature(s) Signed: 10/04/2019 12:59:59 PM By: Army Melia Entered By: Army Melia on 10/01/2019 11:41:50 [place], [name] ([id number]) -------------------------------------------------------------------------------- [place] Details Patient Name: [name]. Date of Service: 10/01/2019 11:00 AM Medical Record Number: [id number] Patient Account Number: [id number] Date of Birth/Sex: [birth year] (64 y.o. F) Treating RN: Army Melia Primary Care Esmay Amspacher: Neta Ehlers Other Clinician: Referring Adelena Desantiago: Neta Ehlers Treating  Namira Rosekrans/Extender: Melburn Hake, HOYT Weeks in Treatment: 5 Active Inactive Abuse / Safety / Falls / Self Care Management Nursing Diagnoses: Potential for falls Goals: Patient will not experience any injury related to falls Date Initiated: 08/27/2019 Target Resolution Date: 11/20/2019 Goal Status: Active Interventions: Assess fall risk on admission and as needed Notes: Orientation to the Wound Care Program Nursing Diagnoses: Knowledge deficit related to the wound healing center program Goals: Patient/caregiver will verbalize understanding of the [place] Program Date Initiated: 08/27/2019 Target Resolution Date: 11/20/2019 Goal Status: Active Interventions: Provide education on orientation to the wound center Notes: Venous Leg Ulcer Nursing Diagnoses: Potential for venous Insuffiency (use before diagnosis confirmed) Goals: Patient will maintain optimal edema control Date Initiated: 08/27/2019 Target Resolution Date: 11/20/2019 Goal Status: Active Interventions: Assess peripheral edema status every visit. SHAREETA, SJOBLOM ([id number]) Notes: Wound/Skin Impairment Nursing Diagnoses: Impaired tissue integrity Goals: Ulcer/skin breakdown will heal within 14 weeks Date Initiated: 08/27/2019 Target Resolution Date: 11/20/2019 Goal Status: Active Interventions: Assess patient/caregiver ability to obtain necessary supplies Assess patient/caregiver ability to perform ulcer/skin care regimen upon  admission and as needed Assess ulceration(s) every visit Notes: Electronic Signature(s) Signed: 10/04/2019 12:59:59 PM By: Army Melia Entered By: Army Melia on 10/01/2019 11:41:38 [place], [place] ([id number]) -------------------------------------------------------------------------------- Pain Assessment Details Patient Name: Martha Downs L. Date of Service: 10/01/2019 11:00 AM Medical Record Number: [id number] Patient Account Number: [id number] Date of Birth/Sex:  1955/12/22 (64 y.o. F) Treating RN: Cornell Barman Primary Care Yovanni Frenette: Neta Ehlers Other Clinician: Referring Florina Glas: Neta Ehlers Treating Kamilah Correia/Extender: Melburn Hake, HOYT Weeks in Treatment: 5 Active Problems Location of Pain Severity and Description of Pain Patient Has Paino Yes Site Locations Rate the pain. Current Pain Level: 1 Pain Management and Medication Current Pain Management: Electronic Signature(s) Signed: 10/01/2019 4:22:15 PM By: Paulla Fore, RRT, CHT Signed: 10/01/2019 5:05:43 PM By: Gretta Cool, BSN, RN, CWS, Kim RN, BSN Entered By: Lorine Bears on 10/01/2019 11:15:59 Bell[place], [name] ([id number]) -------------------------------------------------------------------------------- Patient/Caregiver Education Details Patient Name: [name]. Date of Service: 10/01/2019 11:00 AM Medical Record Number: [id number] Patient Account Number: [id number] Date of Birth/Gender: 1956/03/12 (64 y.o. F) Treating RN: Army Melia Primary Care Physician: Neta Ehlers Other Clinician: Referring Physician: Neta Ehlers Treating Physician/Extender: Sharalyn Ink in Treatment: 5 Education Assessment Education Provided To: Patient Education Topics Provided Wound/Skin Impairment: Handouts: Caring for Your Ulcer Methods: Demonstration, Explain/Verbal Responses: State content correctly Electronic Signature(s) Signed: 10/04/2019 12:59:59 PM By: Army Melia Entered By: Army Melia on 10/01/2019 [place], [place] ([id number]) -------------------------------------------------------------------------------- Wound Assessment Details Patient Name: Martha Downs L. Date of Service: 10/01/2019 11:00 AM Medical Record Number: [id number] Patient Account Number: [id number] Date of Birth/Sex: 17-Dec-1955 (64 y.o. F) Treating RN: Cornell Barman Primary Care Windle Huebert: Neta Ehlers Other Clinician: Referring Kaysen Sefcik: Neta Ehlers Treating Dietrich Samuelson/Extender: Melburn Hake, HOYT Weeks in Treatment: 5 Wound Status Wound Number: 1 Primary Lymphedema Etiology: Wound Location: Left Lower Leg - Circumferential Wound Status: Open Wounding Event: Gradually Appeared Comorbid Anemia, Coronary Artery Disease, Date Acquired: 08/04/2019 History: Hypertension Weeks Of Treatment: 5 Clustered Wound: No Photos Wound Measurements Length: (cm) 29 Width: (cm) 33 Depth: (cm) 0.1 Area: (cm) 751.626 Volume: (cm) 75.163 % Reduction in Area: 27% % Reduction in Volume: 27% Epithelialization: None Wound Description Classification: Partial Thickness Foul Odo Wound Margin: Indistinct, nonvisible Slough/F Exudate Amount: Large Exudate Type: Serous Exudate Color: amber r After Cleansing: No ibrino Yes Wound Bed Granulation Amount: Large (67-100%) Exposed Structure Granulation Quality: Red Fascia Exposed: No Necrotic Amount: None Present (0%) Fat Layer (Subcutaneous Tissue) Exposed: Yes Tendon Exposed: No Muscle Exposed: No Joint Exposed: No Bone Exposed: No Treatment Notes MAIGEN, SUTHAR. ([id number]) Wound #1 (Left, Circumferential Lower Leg) Notes silver alg, xtrasorb, 4 layer Electronic Signature(s) Signed: 10/01/2019 5:05:43 PM By: Gretta Cool, BSN, RN, CWS, Kim RN, BSN Entered By: Gretta Cool, BSN, RN, CWS, Kim on 10/01/2019 11:24:42 [place], [name] ([id number]) -------------------------------------------------------------------------------- Wound Assessment Details Patient Name: Martha Downs L. Date of Service: 10/01/2019 11:00 AM Medical Record Number: [id number] Patient Account Number: [id number] Date of Birth/Sex: Oct 22, 1955 (64 y.o. F) Treating RN: Cornell Barman Primary Care Kashae Carstens: Neta Ehlers Other Clinician: Referring Dandrea Medders: Neta Ehlers Treating Hashem Goynes/Extender: Melburn Hake, HOYT Weeks in Treatment: 5 Wound Status Wound Number: 2 Primary Lymphedema Etiology: Wound Location: Right Lower Leg -  Circumferential Wound Status: Open Wounding Event: Gradually Appeared Comorbid Anemia, Coronary Artery Disease, Date Acquired: 08/04/2019 History: Hypertension Weeks Of Treatment: 5 Clustered Wound: No Photos Wound Measurements Length: (cm) 10 Width: (cm) 26 Depth: (cm) 0.1 Area: (cm) 204.204 Volume: (  cm) 20.42 % Reduction in Area: 27.8% % Reduction in Volume: 27.8% Epithelialization: None Wound Description Classification: Partial Thickness Wound Margin: Indistinct, nonvisible Exudate Amount: Large Exudate Type: Serous Exudate Color: amber Foul Odor After Cleansing: No Slough/Fibrino Yes Wound Bed Granulation Amount: Medium (34-66%) Exposed Structure Granulation Quality: Red Fascia Exposed: No Necrotic Amount: Medium (34-66%) Fat Layer (Subcutaneous Tissue) Exposed: Yes Necrotic Quality: Adherent Slough Tendon Exposed: No Muscle Exposed: No Joint Exposed: No Bone Exposed: No Treatment Notes ALISEN, FREDR[place]SON. ([id number]) Wound #2 (Right, Circumferential Lower Leg) Notes silver alg, xtrasorb, 4 layer Electronic Signature(s) Signed: 10/01/2019 5:05:43 PM By: Gretta Cool, BSN, RN, CWS, Kim RN, BSN Entered By: Gretta Cool, BSN, RN, CWS, Kim on 10/01/2019 11:25:18 [place]ord, [name] ([id number]) -------------------------------------------------------------------------------- Vitals Details Patient Name: Martha Downs L. Date of Service: 10/01/2019 11:00 AM Medical Record Number: [id number] Patient Account Number: [id number] Date of Birth/Sex: 09-Feb-1956 (64 y.o. F) Treating RN: Cornell Barman Primary Care Janelle Spellman: Neta Ehlers Other Clinician: Referring Parris Cudworth: Neta Ehlers Treating Eldean Klatt/Extender: Melburn Hake, HOYT Weeks in Treatment: 5 Vital Signs Time Taken: 11:16 Temperature (F): 97.8 Height (in): 62 Pulse (bpm): 77 Weight (lbs): 290 Respiratory Rate (breaths/min): 18 Body Mass Index (BMI): 53 Blood Pressure (mmHg): 104/50 Reference Range: 80 - 120 mg /  dl Electronic Signature(s) Signed: 10/01/2019 4:22:15 PM By: Lorine Bears RCP, RRT, CHT Entered By: Lorine Bears on 10/01/2019 11:16:24

## 2019-10-01 NOTE — Telephone Encounter (Signed)
Returned call to [place].  Gave direct phone # to coumadin clinic.  See anticoag encounter

## 2019-10-01 NOTE — Progress Notes (Addendum)
[name] ([id number]) Visit Report for 10/01/2019 Chief Complaint Document Details Patient Name: [name], [name]. Date of Service: 10/01/2019 11:00 AM Medical Record Number: [id number] Patient Account Number: [id number] Date of Birth/Sex: 1956/09/09 (64 y.o. F) Treating RN: Cornell Barman Primary Care Provider: Neta Ehlers Other Clinician: Referring Provider: Neta Ehlers Treating Provider/Extender: Melburn Hake, Arnelle Nale Weeks in Treatment: 5 Information Obtained from: Patient Chief Complaint Bilateral LE Lymphedema and ulcers Electronic Signature(s) Signed: 10/01/2019 11:11:09 AM By: Worthy Keeler PA-C Entered By: Worthy Keeler on 10/01/2019 11:11:09 [name], [name] ([id number]) -------------------------------------------------------------------------------- HPI Details Patient Name: Martha Downs L. Date of Service: 10/01/2019 11:00 AM Medical Record Number: [id number] Patient Account Number: [id number] Date of Birth/Sex: 08/17/1956 (64 y.o. F) Treating RN: Cornell Barman Primary Care Provider: Neta Ehlers Other Clinician: Referring Provider: Neta Ehlers Treating Provider/Extender: Melburn Hake, Elihue Ebert Weeks in Treatment: 5 History of Present Illness HPI Description: 08/27/2019 patient presents today for initial evaluation here in our clinic concerning issues that she has been having with her bilateral lower extremities with lymphedema and weeping. She is previously been a patient over the past year of Dr. Jerilee Hoh at the wound care center in [facility]. With that being said she subsequently was recommended by the lymphedema clinic here in [place] to follow-up with [place] here instead of going back to [place] since they were in closer contact with the clinic here than they are obviously. With that being said the patient states that is why she came her way in order to be evaluated at this point. Fortunately there is no signs of active infection at this time. No  fevers, chills, nausea, vomiting, or diarrhea. The patient does have a history of lymphedema, hypertension, long-term use of anticoagulant therapy, and obstructive sleep apnea. During the visit today and from checking into the discharge she was prone to unexpectedly falling asleep. I did discuss this with her daughter as well who states that this is very common for her she states she is not really sure how compliant her mother is with her CPAP machine that may be part of the issue. The patient did have ABIs performed which revealed have copies of his well today that was on 05/01/2019 and it showed a right ABI of 1.17 with a TBI of 1.12 and a left ABI of 1.24 with a TBI of 0.96. She has had 2 transfusions in the past 2 weeks apparently as well. 08/31/2019 upon evaluation today patient actually appears to be doing somewhat better in regard to her lower extremity ulcers. She still has a tremendous amount of drainage from the left lower extremity unfortunately but her right lower extremity is doing much better which is great news. Overall I am very pleased in this regard. With regard to home health we finally got things settled so home health will be coming out to see her this would be very beneficial in my opinion as I think they will be able to help her tremendously with more frequent dressing changes. 12/28-Patient is back here at 1 week visit, with regard to her bilateral lower extremity ulcers, she has a significant degree of lymphedema, significant degree of drainage more from the left and which continues to be an issue, patient had not been using her lymphedema pumps as recommended, she is concerned about amount of drainage and how quickly it soaks the dressing, she is concerned about the lymphedema pump sticking to her skin and was told to use it on the wraps.  She will get in touch with her cardiologist about adjusting her Lasix dose. 09/14/2019 on evaluation today patient appears to be doing  poorly in regard to her lower extremities especially left lower extremity. Unfortunately she is continue to have a significant amount of drainage which is preventing her from being able to heal keeping the wounds very macerated. She also has some warmth to touch in the anterior to medial portion of the left lower extremity compared to the remainder of the wound which does have made somewhat concerned about the possibility of cellulitis. Obviously it can be difficult to determine just strictly lymphedema versus cellulitis but right now I am somewhat concerned. She tells me that she can put a wrap on and at least has to change this twice a day or else she will be walking around leaving puddles of water behind her. Obviously this is fairly significant. She did see her primary care provider they did not see anything going on that they felt needed to be addressed from a diuretic standpoint but nonetheless I am still somewhat concerned based on what we are seeing to be honest. The patient and her husband are likewise concerned. 09/17/2019 patient is seen today in follow-up I last saw her on Tuesday at that point based on the severity of her lower extremity edema, weeping, and erythema I did send her to the ER for further evaluation and treatment. She subsequently did go for that evaluation. It did appear that based on my review of the notes this is dated 09/14/2019 that the patient did have a BNP of 149.4. Which was notable. Subsequently she was given a dose of IV vancomycin as well as doxycycline and Keflex. It appears however that she was discharged on oral clindamycin 150 mg every 6 hours and to be honest compared to what I saw on Tuesday she actually seems to be doing somewhat better. Fortunately there is no evidence of active infection at this time which is good news. In the interim she did yesterday see her cardiologist as well Dr. Bettina Gavia. That was on 09/16/2019. Upon review of this note it does appear  that his recommendation at this point was to increase the furosemide to 40 mg 2 times a day. He also recommend an increase of potassium 10 mg taken 2 times a day. His other recommendation was that the patient needs to have a follow-up with her hematology oncologist in order to see if they can do anything about iron transfusions for her as unfortunately her iron is still quite low and obviously oral supplementation is not going to be the best [place], [place] ([id number]) way to get this up. 09/24/2019 upon evaluation today patient appears to be doing quite well with regard to her lower extremities. The right lower extremity in particular has dramatically improved to be honest so has the left in my opinion. There does not appear to be any signs of active infection which is good news. No fevers, chills, nausea, vomiting, or diarrhea. Patient is still taking the clindamycin at this time. 10/01/2019 on evaluation today patient appears to be showing some signs of improvement at this point in regard to her lower extremities. The right lower extremity is doing quite well the left lower extremity is still draining but again I do believe this is significantly better compared to what it was in the past. Fortunately there is no evidence of active infection at this time. No fever chills noted Electronic Signature(s) Signed: 10/01/2019 11:56:41 AM By: Melburn Hake,  Jenan Ellegood PA-C Entered By: Worthy Keeler on 10/01/2019 11:56:41 Jose, [name] ([id number]) -------------------------------------------------------------------------------- Physical Exam Details Patient Name: [name], [name] Date of Service: 10/01/2019 11:00 AM Medical Record Number: [id number] Patient Account Number: [id number] Date of Birth/Sex: March 01, 1956 (64 y.o. F) Treating RN: Cornell Barman Primary Care Provider: Neta Ehlers Other Clinician: Referring Provider: Neta Ehlers Treating Provider/Extender: Melburn Hake, Nialah Saravia Weeks in Treatment:  5 Constitutional Well-nourished and well-hydrated in no acute distress. Respiratory normal breathing without difficulty. Psychiatric this patient is able to make decisions and demonstrates good insight into disease process. Alert and Oriented x 3. pleasant and cooperative. Notes Patient's wounds currently still show signs of leakage but again things are drying up quite nicely. I do feel like if she can wear the stronger compression more readily and frequently should be doing to get better they state the wraps that we put on last fairly well in fact until normally when the nurse comes out. However what they put on does not seem to last that well. They are just using ABD pads not XtraSorb according to the patient and her husband. Again I have not seen what the home health nurses actually putting on because they are never on when she comes in. Electronic Signature(s) Signed: 10/01/2019 11:57:14 AM By: Worthy Keeler PA-C Entered By: Worthy Keeler on 10/01/2019 11:57:14 [name], [name] ([id number]) -------------------------------------------------------------------------------- Physician Orders Details Patient Name: Martha Siren. Date of Service: 10/01/2019 11:00 AM Medical Record Number: [id number] Patient Account Number: [id number] Date of Birth/Sex: 12/09/1955 (64 y.o. 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 - Do not get your wraps wet Wound #2 Right,Circumferential Lower Leg o Cleanse wound with mild soap and water o May shower with protection. - Do not get your wraps wet Primary Wound Dressing Wound #1 Left,Circumferential Lower Leg o Silver Alginate Wound #2 Right,Circumferential Lower Leg o Silver Alginate Secondary Dressing Wound #1 Left,Circumferential Lower Leg o XtraSorb Wound #2 Right,Circumferential Lower Leg o XtraSorb Dressing Change Frequency Wound #1 Left,Circumferential Lower Leg o Change Dressing Monday, Wednesday, Friday Wound #2 Right,Circumferential Lower Leg o Change Dressing Monday, Wednesday, Friday Follow-up Appointments Wound #1 Left,Circumferential Lower Leg o Return Appointment in 1 week. [name], [name] ([id number]) Wound #2 Right,Circumferential Lower Leg o Return Appointment in 1 week. Edema Control Wound #1 Left,Circumferential Lower Leg o 4 Layer Compression System - Bilateral - anchor with unna on the right Wound #2 Right,Circumferential Lower Leg o 4 Layer Compression System - Bilateral - anchor with unna on the right Home Health Wound #1 Left,Circumferential Lower Leg o [place] Visits - [place] Nurse may visit PRN to address patientos wound care needs. o FACE TO FACE ENCOUNTER: MEDICARE and MEDICAID PATIENTS: I certify that this patient is under my care and that I had a face-to-face encounter that meets the physician face-to-face encounter requirements with this patient on this date. The encounter with the  patient was in whole or in part for the following MEDICAL CONDITION: (primary reason for [place]) MEDICAL NECESSITY: I certify, that based on my findings, NURSING services are a medically necessary home health service. HOME BOUND STATUS: I certify that my clinical findings support that this  patient is homebound (i.e., Due to illness or injury, pt requires aid of supportive devices such as crutches, cane, wheelchairs, walkers, the use of special transportation or the assistance of another person to leave their place of residence. There is a normal inability to leave the home and doing so requires considerable and taxing effort. Other absences are for medical reasons / religious services and are infrequent or of short duration when for other reasons). o If current dressing causes regression in wound condition, may D/C ordered dressing product/s and apply Normal Saline Moist Dressing daily until next [place] / Other MD appointment. [place] of regression in wound condition at [phone]. o Please direct any NON-WOUND related issues/requests for orders to patient's Primary Care Physician Wound #2 Right,Circumferential Lower Leg o [place] Visits - [place] Nurse may visit PRN to address patientos wound care needs. o FACE TO FACE ENCOUNTER: MEDICARE and MEDICAID PATIENTS: I certify that this patient is under my care and that I had a face-to-face encounter that meets the physician face-to-face encounter requirements with this patient on this date. The encounter with the patient was in whole or in part for the following MEDICAL CONDITION: (primary reason for [place]) MEDICAL NECESSITY: I certify, that based on my findings, NURSING services are a medically necessary home health service. HOME BOUND STATUS: I certify that my clinical findings support that this patient is homebound (i.e., Due to illness or injury, pt requires aid of supportive devices such as crutches, cane, wheelchairs, walkers, the use of special transportation or the assistance of another person to leave their place of residence. There is a normal inability to leave the home and doing so requires considerable and taxing effort. Other absences are  for medical reasons / religious services and are infrequent or of short duration when for other reasons). o If current dressing causes regression in wound condition, may D/C ordered dressing product/s and apply Normal Saline Moist Dressing daily until next [place] / Other MD appointment. [place] of regression in wound condition at [phone]. o Please direct any NON-WOUND related issues/requests for orders to patient's Primary Care Physician Electronic Signature(s) Signed: 10/01/2019 1:21:59 PM By: Worthy Keeler PA-C Signed: 10/04/2019 12:59:59 PM By: Army Melia Entered By: Army Melia on 10/01/2019 [place], [name] ([id number]) -------------------------------------------------------------------------------- Problem List Details Patient Name: GIANINA, GIESEL L. Date of Service: 10/01/2019 11:00 AM Medical Record Number: [id number] Patient Account Number: [id number] Date of Birth/Sex: Jul 05, 1956 (64 y.o. F) Treating RN: Cornell Barman Primary Care Provider: Neta Ehlers Other Clinician: Referring Provider: Neta Ehlers Treating Provider/Extender: Sharalyn Ink in Treatment: 5 Active Problems ICD-10 Evaluated Encounter Code Description Active Date Today Diagnosis I89.0 Lymphedema, not elsewhere classified 08/27/2019 No Yes L97.812 Non-pressure chronic ulcer of other part of right lower leg 08/27/2019 No Yes with fat layer exposed L97.822 Non-pressure chronic ulcer of other part of left lower leg with 08/27/2019 No Yes fat layer exposed I10 Essential (primary) hypertension 08/27/2019 No Yes Z79.01 Long term (current) use of anticoagulants 08/27/2019 No Yes Inactive Problems Resolved Problems Electronic Signature(s) Signed: 10/01/2019 11:11:04 AM By: Worthy Keeler PA-C Entered By: Worthy Keeler  on 10/01/2019 11:11:04 [name], [name]  ([id number]) -------------------------------------------------------------------------------- Progress Note Details Patient Name: [name], EMMERLING. Date of Service: 10/01/2019 11:00 AM Medical Record Number: [id number] Patient Account Number: [id number] Date of Birth/Sex: 1956-01-05 (64 y.o. F) Treating RN: Cornell Barman Primary Care Provider: Neta Ehlers Other Clinician: Referring Provider: Neta Ehlers Treating Provider/Extender: Sharalyn Ink in Treatment: 5 Subjective Chief Complaint Information obtained from Patient Bilateral LE Lymphedema and ulcers History of Present Illness (HPI) 08/27/2019 patient presents today for initial evaluation here in our clinic concerning issues that she has been having with her bilateral lower extremities with lymphedema and weeping. She is previously been a patient over the past year of Dr. Jerilee Hoh at the wound care center in [facility]. With that being said she subsequently was recommended by the lymphedema clinic here in [place] to follow-up with [place] here instead of going back to [place] since they were in closer contact with the clinic here than they are obviously. With that being said the patient states that is why she came her way in order to be evaluated at this point. Fortunately there is no signs of active infection at this time. No fevers, chills, nausea, vomiting, or diarrhea. The patient does have a history of lymphedema, hypertension, long-term use of anticoagulant therapy, and obstructive sleep apnea. During the visit today and from checking into the discharge she was prone to unexpectedly falling asleep. I did discuss this with her daughter as well who states that this is very common for her she states she is not really sure how compliant her mother is with her CPAP machine that may be part of the issue. The patient did have ABIs performed which revealed have copies of his well today that was on 05/01/2019 and it  showed a right ABI of 1.17 with a TBI of 1.12 and a left ABI of 1.24 with a TBI of 0.96. She has had 2 transfusions in the past 2 weeks apparently as well. 08/31/2019 upon evaluation today patient actually appears to be doing somewhat better in regard to her lower extremity ulcers. She still has a tremendous amount of drainage from the left lower extremity unfortunately but her right lower extremity is doing much better which is great news. Overall I am very pleased in this regard. With regard to home health we finally got things settled so home health will be coming out to see her this would be very beneficial in my opinion as I think they will be able to help her tremendously with more frequent dressing changes. 12/28-Patient is back here at 1 week visit, with regard to her bilateral lower extremity ulcers, she has a significant degree of lymphedema, significant degree of drainage more from the left and which continues to be an issue, patient had not been using her lymphedema pumps as recommended, she is concerned about amount of drainage and how quickly it soaks the dressing, she is concerned about the lymphedema pump sticking to her skin and was told to use it on the wraps. She will get in touch with her cardiologist about adjusting her Lasix dose. 09/14/2019 on evaluation today patient appears to be doing poorly in regard to her lower extremities especially left lower extremity. Unfortunately she is continue to have a significant amount of drainage which is preventing her from being able to heal keeping the wounds very macerated. She also has some warmth to touch in the anterior to medial portion of the left lower extremity compared to  the remainder of the wound which does have made somewhat concerned about the possibility of cellulitis. Obviously it can be difficult to determine just strictly lymphedema versus cellulitis but right now I am somewhat concerned. She tells me that she can put a wrap  on and at least has to change this twice a day or else she will be walking around leaving puddles of water behind her. Obviously this is fairly significant. She did see her primary care provider they did not see anything going on that they felt needed to be addressed from a diuretic standpoint but nonetheless I am still somewhat concerned based on what we are seeing to be honest. The patient and her husband are likewise concerned. 09/17/2019 patient is seen today in follow-up I last saw her on Tuesday at that point based on the severity of her lower extremity edema, weeping, and erythema I did send her to the ER for further evaluation and treatment. She subsequently did go for that evaluation. It did appear that based on my review of the notes this is dated 09/14/2019 that the patient did have a BNP of 149.4. Which was notable. Subsequently she was given a dose of IV vancomycin as well as doxycycline and Keflex. It appears however that she was discharged on oral clindamycin 150 mg every 6 hours and to be honest compared to what I saw on Tuesday she actually seems to be doing somewhat better. Fortunately there is no evidence of active infection at this time [name], [name]. ([id number]) which is good news. In the interim she did yesterday see her cardiologist as well Dr. Bettina Gavia. That was on 09/16/2019. Upon review of this note it does appear that his recommendation at this point was to increase the furosemide to 40 mg 2 times a day. He also recommend an increase of potassium 10 mg taken 2 times a day. His other recommendation was that the patient needs to have a follow-up with her hematology oncologist in order to see if they can do anything about iron transfusions for her as unfortunately her iron is still quite low and obviously oral supplementation is not going to be the best way to get this up. 09/24/2019 upon evaluation today patient appears to be doing quite well with regard to her lower extremities.  The right lower extremity in particular has dramatically improved to be honest so has the left in my opinion. There does not appear to be any signs of active infection which is good news. No fevers, chills, nausea, vomiting, or diarrhea. Patient is still taking the clindamycin at this time. 10/01/2019 on evaluation today patient appears to be showing some signs of improvement at this point in regard to her lower extremities. The right lower extremity is doing quite well the left lower extremity is still draining but again I do believe this is significantly better compared to what it was in the past. Fortunately there is no evidence of active infection at this time. No fever chills noted Objective Constitutional Well-nourished and well-hydrated in no acute distress. Vitals Time Taken: 11:16 AM, Height: 62 in, Weight: 290 lbs, BMI: 53, Temperature: 97.8 F, Pulse: 77 bpm, Respiratory Rate: 18 breaths/min, Blood Pressure: 104/50 mmHg. Respiratory normal breathing without difficulty. Psychiatric this patient is able to make decisions and demonstrates good insight into disease process. Alert and Oriented x 3. pleasant and cooperative. General Notes: Patient's wounds currently still show signs of leakage but again things are drying up quite nicely. I do feel like if  she can wear the stronger compression more readily and frequently should be doing to get better they state the wraps that we put on last fairly well in fact until normally when the nurse comes out. However what they put on does not seem to last that well. They are just using ABD pads not XtraSorb according to the patient and her husband. Again I have not seen what the home health nurses actually putting on because they are never on when she comes in. Integumentary (Hair, Skin) Wound #1 status is Open. Original cause of wound was Gradually Appeared. The wound is located on the Left,Circumferential Lower Leg. The wound measures 29cm  length x 33cm width x 0.1cm depth; 751.626cm^2 area and 75.163cm^3 volume. There is Fat Layer (Subcutaneous Tissue) Exposed exposed. There is a large amount of serous drainage noted. The wound margin is indistinct and nonvisible. There is large (67-100%) red granulation within the wound bed. There is no necrotic tissue within the wound bed. Wound #2 status is Open. Original cause of wound was Gradually Appeared. The wound is located on the Right,Circumferential Lower Leg. The wound measures 10cm length x 26cm width x 0.1cm depth; 204.204cm^2 area and 20.42cm^3 volume. There is Fat Layer (Subcutaneous Tissue) Exposed exposed. There is a large amount of serous drainage noted. The wound margin is indistinct and nonvisible. There is medium (34-66%) red granulation within the wound bed. There [place], [place] ([id number]) is a medium (34-66%) amount of necrotic tissue within the wound bed including [place]. Assessment Active Problems ICD-10 Lymphedema, not elsewhere classified Non-pressure chronic ulcer of other part of right lower leg with fat layer exposed Non-pressure chronic ulcer of other part of left lower leg with fat layer exposed Essential (primary) hypertension Long term (current) use of anticoagulants Procedures Wound #1 Pre-procedure diagnosis of Wound #1 is a Lymphedema located on the Left,Circumferential Lower Leg . There was a Four Layer Compression Therapy Procedure by Army Melia, RN. Post procedure Diagnosis Wound #1: Same as Pre-Procedure Wound #2 Pre-procedure diagnosis of Wound #2 is a Lymphedema located on the Right,Circumferential Lower Leg . There was a Four Layer Compression Therapy Procedure by Army Melia, RN. Post procedure Diagnosis Wound #2: Same as Pre-Procedure Plan Wound Cleansing: Wound #1 Left,Circumferential Lower Leg: Cleanse wound with mild soap and water May shower with protection. - Do not get your wraps wet Wound #2 Right,Circumferential  Lower Leg: Cleanse wound with mild soap and water May shower with protection. - Do not get your wraps wet Primary Wound Dressing: Wound #1 Left,Circumferential Lower Leg: Silver Alginate Wound #2 Right,Circumferential Lower Leg: Silver Alginate Secondary Dressing: Wound #1 Left,Circumferential Lower Leg: XtraSorb Wound #2 Right,Circumferential Lower Leg: [name], [name] Dressing Change Frequency: Wound #1 Left,Circumferential Lower Leg: Change Dressing Monday, Wednesday, Friday Wound #2 Right,Circumferential Lower Leg: Change Dressing Monday, Wednesday, Friday Follow-up Appointments: Wound #1 Left,Circumferential Lower Leg: Return Appointment in 1 week. Wound #2 Right,Circumferential Lower Leg: Return Appointment in 1 week. Edema Control: Wound #1 Left,Circumferential Lower Leg: 4 Layer Compression System - Bilateral - anchor with unna on the right Wound #2 Right,Circumferential Lower Leg: 4 Layer Compression System - Bilateral - anchor with unna on the right Home Health: Wound #1 Left,Circumferential Lower Leg: Continue Home Health Visits - [place] Nurse may visit PRN to address patient s wound care needs. FACE TO FACE ENCOUNTER: MEDICARE and MEDICAID PATIENTS: I certify that this patient is under my care and that I had a face-to-face encounter that meets  the physician face-to-face encounter requirements with this patient on this date. The encounter with the patient was in whole or in part for the following MEDICAL CONDITION: (primary reason for [place]) MEDICAL NECESSITY: I certify, that based on my findings, NURSING services are a medically necessary home health service. HOME BOUND STATUS: I certify that my clinical findings support that this patient is homebound (i.e., Due to illness or injury, pt requires aid of supportive devices such as crutches, cane, wheelchairs, walkers, the use of special transportation or the assistance of  another person to leave their place of residence. There is a normal inability to leave the home and doing so requires considerable and taxing effort. Other absences are for medical reasons / religious services and are infrequent or of short duration when for other reasons). If current dressing causes regression in wound condition, may D/C ordered dressing product/s and apply Normal Saline Moist Dressing daily until next [place] / Other MD appointment. [place] of regression in wound condition at [phone]. Please direct any NON-WOUND related issues/requests for orders to patient's Primary Care Physician Wound #2 Right,Circumferential Lower Leg: [place] Visits - [place] Nurse may visit PRN to address patient s wound care needs. FACE TO FACE ENCOUNTER: MEDICARE and MEDICAID PATIENTS: I certify that this patient is under my care and that I had a face-to-face encounter that meets the physician face-to-face encounter requirements with this patient on this date. The encounter with the patient was in whole or in part for the following MEDICAL CONDITION: (primary reason for [place]) MEDICAL NECESSITY: I certify, that based on my findings, NURSING services are a medically necessary home health service. HOME BOUND STATUS: I certify that my clinical findings support that this patient is homebound (i.e., Due to illness or injury, pt requires aid of supportive devices such as crutches, cane, wheelchairs, walkers, the use of special transportation or the assistance of another person to leave their place of residence. There is a normal inability to leave the home and doing so requires considerable and taxing effort. Other absences are for medical reasons / religious services and are infrequent or of short duration when for other reasons). If current dressing causes regression in wound condition, may D/C ordered dressing product/s and apply  Normal Saline Moist Dressing daily until next [place] / Other MD appointment. [place] of regression in wound condition at [phone]. Please direct any NON-WOUND related issues/requests for orders to patient's Primary Care Physician 1 at this time my suggestion would be that we go ahead and continue with the compression wrap as before I feel like this is helping the patient I do feel like things are drying up. Again were using a 4-layer compression wrap bilaterally. 2. I am also going to suggest that we continue with the silver alginate dressing followed by the XtraSorb I think this should keep things from leaking through the wrap. They state will be put this on it seems to work the home health wraps are not the same. 3. I do recommend the patient continue to elevate her legs as much as possible. [place], [place] ([id number]) We will see patient back for reevaluation in 1 week here in the clinic. If anything worsens or changes patient will contact our office for additional recommendations. Electronic Signature(s) Signed: 10/01/2019 11:58:02 AM By: Worthy Keeler PA-C Entered By: Worthy Keeler on 10/01/2019 11:58:02 [name], [name] ([id number]) -------------------------------------------------------------------------------- SuperBill Details Patient Name: [name],  [name] Date of Service: 10/01/2019 Medical Record Number: [id number] Patient Account Number: [id number] Date of Birth/Sex: Jun 26, 1956 (64 y.o. F) Treating RN: Army Melia Primary Care Provider: Neta Ehlers Other Clinician: Referring Provider: Neta Ehlers Treating Provider/Extender: Melburn Hake, Idaly Verret Weeks in Treatment: 5 Diagnosis Coding ICD-10 Codes Code Description I89.0 Lymphedema, not elsewhere classified L97.812 Non-pressure chronic ulcer of other part of right lower leg with fat layer exposed L97.822 Non-pressure chronic ulcer of other part of left lower leg with fat layer  exposed [place] (primary) hypertension Z79.01 Long term (current) use of anticoagulants Facility Procedures CPT4: Description Modifier Quantity Code [id number] BILATERAL: Application of multi-layer venous compression system; leg (below 1 knee), including ankle and foot. Physician Procedures CPT4 Code Description: [id number] - WC PHYS LEVEL 3 - EST PT ICD-10 Diagnosis Description I89.0 Lymphedema, not elsewhere classified [id number] Non-pressure chronic ulcer of other part of right lower leg wi L97.822 Non-pressure chronic ulcer of other  part of left lower leg wit I10 Essential (primary) hypertension Modifier: th fat layer expos h fat layer expose Quantity: 1 ed d Electronic Signature(s) Signed: 10/01/2019 11:58:15 AM By: Worthy Keeler PA-C Entered By: Worthy Keeler on 10/01/2019 11:58:15

## 2019-10-04 ENCOUNTER — Encounter: Payer: BC Managed Care – PPO | Admitting: Occupational Therapy

## 2019-10-06 ENCOUNTER — Telehealth: Payer: Self-pay | Admitting: *Deleted

## 2019-10-06 ENCOUNTER — Encounter: Payer: BC Managed Care – PPO | Admitting: Occupational Therapy

## 2019-10-06 ENCOUNTER — Telehealth: Payer: Self-pay | Admitting: Cardiology

## 2019-10-06 NOTE — Telephone Encounter (Signed)
Patient returning call in regards to lab results.

## 2019-10-06 NOTE — Telephone Encounter (Signed)
Telephone call to patient. Informed of her lab results again and to continue same medication regimen

## 2019-10-06 NOTE — Telephone Encounter (Signed)
Telephone call to patient. Left message with lab results and to call with any questions.

## 2019-10-06 NOTE — Telephone Encounter (Signed)
-----   Message from Richardo Priest, MD sent at 09/29/2019  8:03 PM EST ----- Normal or stable result  Good result no change in diuretic

## 2019-10-07 ENCOUNTER — Ambulatory Visit: Payer: BC Managed Care – PPO | Admitting: Pulmonary Disease

## 2019-10-07 ENCOUNTER — Other Ambulatory Visit: Payer: Self-pay

## 2019-10-07 ENCOUNTER — Ambulatory Visit (INDEPENDENT_AMBULATORY_CARE_PROVIDER_SITE_OTHER): Payer: Medicare Other | Admitting: Pulmonary Disease

## 2019-10-07 ENCOUNTER — Encounter: Payer: Self-pay | Admitting: Pulmonary Disease

## 2019-10-07 VITALS — BP 150/72 | HR 96 | Temp 97.3°F | Ht 61.0 in | Wt 278.8 lb

## 2019-10-07 DIAGNOSIS — G4700 Insomnia, unspecified: Secondary | ICD-10-CM

## 2019-10-07 DIAGNOSIS — I5032 Chronic diastolic (congestive) heart failure: Secondary | ICD-10-CM | POA: Diagnosis not present

## 2019-10-07 DIAGNOSIS — R600 Localized edema: Secondary | ICD-10-CM

## 2019-10-07 DIAGNOSIS — G4733 Obstructive sleep apnea (adult) (pediatric): Secondary | ICD-10-CM | POA: Diagnosis not present

## 2019-10-07 NOTE — Assessment & Plan Note (Signed)
Plan: Continue Rozerem  Emphasized need to use BiPAP nightly

## 2019-10-07 NOTE — Assessment & Plan Note (Signed)
Plan: Continue to work on losing weight Follow a low carbohydrate diet Work to reduce BMI

## 2019-10-07 NOTE — Assessment & Plan Note (Signed)
Patient reports that she is using her BiPAP occasionally Patient does not use it every night We do not have a compliance report  Plan: Please present to our office with the SD card or present to your DME company with the SD card  Start using BiPAP every night  Follow-up in 6 to 8 weeks with Dr. Ander Slade

## 2019-10-07 NOTE — Assessment & Plan Note (Signed)
Lymphedema Follows in wound clinic weekly  Plan: Keep follow-up with wound clinic

## 2019-10-07 NOTE — Assessment & Plan Note (Signed)
Plan: Continue follow-up with cardiology 

## 2019-10-07 NOTE — Patient Instructions (Addendum)
You were seen today by Lauraine Rinne, NP  for:   1. Obstructive sleep apnea syndrome  Please bring the SD card from your BiPAP to our office so that way we can obtain a download sometime over the next week.  We recommend that you continue using your BIPAP daily >>>Keep up the hard work using your device >>> Goal should be wearing this for the entire night that you are sleeping, at least 4 to 6 hours  Remember:  . Do not drive or operate heavy machinery if tired or drowsy.  . Please notify the supply company and office if you are unable to use your device regularly due to missing supplies or machine being broken.  . Work on maintaining a healthy weight and following your recommended nutrition plan  . Maintain proper daily exercise and movement  . Maintaining proper use of your device can also help improve management of other chronic illnesses such as: Blood pressure, blood sugars, and weight management.   BiPAP/ CPAP Cleaning:  >>>Clean weekly, with Dawn soap, and bottle brush.  Set up to air dry. >>> Wipe mask out daily with wet wipe or towelette  2. Insomnia, unspecified type  Continue Rozerem   3. Morbid obesity ([place])     Follow Up:    Return in about 2 months (around 12/05/2019), or if symptoms worsen or fail to improve, for Follow up with Dr. Ander Slade.   Please do your part to reduce the spread of COVID-19:      Reduce your risk of any infection  and COVID19 by using the similar precautions used for avoiding the common cold or flu:  Marland Kitchen Wash your hands often with soap and warm water for at least 20 seconds.  If soap and water are not readily available, use an alcohol-based hand sanitizer with at least 60% alcohol.  . If coughing or sneezing, cover your mouth and nose by coughing or sneezing into the elbow areas of your shirt or coat, into a tissue or into your sleeve (not your hands). Langley Gauss A MASK when in public  . Avoid shaking hands with others and consider head nods or  verbal greetings only. . Avoid touching your eyes, nose, or mouth with unwashed hands.  . Avoid close contact with people who are sick. . Avoid places or events with large numbers of people in one location, like concerts or sporting events. . If you have some symptoms but not all symptoms, continue to monitor at home and seek medical attention if your symptoms worsen. . If you are having a medical emergency, call 911.   [place] / e-Visit: [email or web address]         [facility]: [facility]: [id number]                   [facility]: [id number]     It is flu season:   >>> Best ways to protect herself from the flu: Receive the yearly flu vaccine, practice good hand hygiene washing with soap and also using hand sanitizer when available, eat a nutritious meals, get adequate rest, hydrate appropriately   Please contact the office if your symptoms worsen or you have concerns that you are not improving.   Thank you for choosing [facility] for your healthcare, and for allowing [place] to partner with you on your healthcare journey. I am thankful to be able to  provide care to you today.   Wyn Quaker FNP-C

## 2019-10-07 NOTE — Progress Notes (Signed)
@Patient  ID: [name], female    DOB: 05-13-1956, 64 y.o.   MRN: [id number]  Chief Complaint  Patient presents with  . Follow-up    OSA - Severe, uses Bipap occasionally     Referring provider: [name], MD  HPI:  64 year old female never smoker followed in our office for severe obstructive sleep apnea and insomnia  PMH: Morbid obesity, hypertension, CAD, chronic diastolic congestive heart failure, lymphedema Smoker/ Smoking History: Never Smoker  Maintenance:  None  Pt of: Dr. Ander Slade  10/07/2019  - Visit   64 year old female never smoker initially referred to our office on 06/21/2019 for obstructive sleep apnea. January/2020-sleep study-severe obstructive sleep apnea requiring BiPAP patient was last seen in our office in December/2020.  At that time she was encouraged to resume compliance with BiPAP.  She was currently noncompliant at that time.  She is encouraged to increase her physical activity due to her severe deconditioning as well as obesity.  She was encouraged to continue on her Rozerem 8 mg nightly for insomnia.  Patient presenting today as a follow-up from December/2020.  No available compliance reports available online.  [place] directly.  DME company reporting the patient is not currently using BiPAP.  Patient reports that there was confusion when she was initially prescribed CPAP therapy by primary care in 2020.  This was prescribed incorrectly from primary care and then Dr. Evette Georges office with cardiology wanted her to be on BiPAP.  She was later referred to our office.  She reports that she is currently using her BiPAP.  Unfortunately we do not have a compliance report on this.  She reports that she does not use it every every day but over the last week she is used it four times.  They believe that they have an SD card in the machine.  They will also follow-up with her DME company [employer].  Patient aware she will need to bring SD card to  [place] or to our office to obtain download   Questionaires / Pulmonary Flowsheets:   Epworth:  Results of the Epworth flowsheet 06/21/2019  Sitting and reading 2  Watching TV 2  Sitting, inactive in a public place (e.g. a theatre or a meeting) 1  As a passenger in a car for an hour without a break 2  Lying down to rest in the afternoon when circumstances permit 1  Sitting and talking to someone 1  Sitting quietly after a lunch without alcohol 2  In a car, while stopped for a few minutes in traffic 1  Total score 12    Tests:   09/15/2019-BNP-149.4  06/04/2019-echocardiogram-LV ejection fraction greater than 75%, right ventricle is normal functioning, mildly increased left ventricular hypertrophy  09/15/2018-split-night sleep study-AHI 120.7  FENO:  No results found for: NITRICOXIDE  PFT: No flowsheet data found.  WALK:  No flowsheet data found.  Imaging: No results found.  Lab Results:  CBC    Component Value Date/Time   WBC 15.8 (H) 09/14/2019 2008   RBC 4.12 09/14/2019 2008   HGB 8.8 (L) 09/14/2019 2008   HGB 9.5 (L) 02/12/2019 1116   HCT 33.0 (L) 09/14/2019 2008   HCT 32.0 (L) 06/06/2019 0518   PLT 362 09/14/2019 2008   PLT 393 02/12/2019 1116   MCV 80.1 09/14/2019 2008   MCV 76 (L) 02/12/2019 1116   MCH 21.4 (L) 09/14/2019 2008   MCHC 26.7 (L) 09/14/2019 2008   RDW 21.5 (H) 09/14/2019  2008   RDW 18.2 (H) 02/12/2019 1116   LYMPHSABS 2.7 06/09/2019 0427   MONOABS 1.3 (H) 06/09/2019 0427   EOSABS 0.5 06/09/2019 0427   BASOSABS 0.1 06/09/2019 0427    BMET    Component Value Date/Time   NA 137 09/14/2019 2008   NA 136 02/12/2019 1116   K 3.9 09/14/2019 2008   CL 100 09/14/2019 2008   CO2 27 09/14/2019 2008   GLUCOSE 129 (H) 09/14/2019 2008   BUN 11 09/14/2019 2008   BUN 20 02/12/2019 1116   CREATININE 0.77 09/14/2019 2008   CALCIUM 9.3 09/14/2019 2008   GFRNONAA >60 09/14/2019 2008   GFRAA >60 09/14/2019 2008    BNP    Component Value  Date/Time   BNP 149.4 (H) 09/15/2019 1107    ProBNP    Component Value Date/Time   PROBNP 640 (H) 02/12/2019 1116    Specialty Problems      Pulmonary Problems   Sleep apnea      Allergies  Allergen Reactions  . Vancomycin Itching    Immunization History  Administered Date(s) Administered  . Influenza Inj Mdck Quad Pf 09/14/2018  . Influenza,inj,Quad PF,6+ Mos 06/05/2019  . Pneumococcal Polysaccharide-23 06/05/2019    Past Medical History:  Diagnosis Date  . Aortic stenosis 03/23/2013   Overview:  02/18/13 TTE EF >55%. Critical AS with mean Ao valve gradient of 82 mm Hg. No AI. No MR, PR, mild TR. Estimated RVSP 30 mm Hg.  . Bicuspid aortic valve   . Edema of both legs 02/20/2017  . 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 Heart failure ([place])   . HTN (hypertension) 03/23/2013  . 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 Morbid obesity ([place]) 02/20/2017  . Sleep apnea     Tobacco History: Social History   Tobacco Use  Smoking Status Never Smoker  Smokeless Tobacco Never Used   Counseling given: Yes   Continue to not smoke  Outpatient Encounter Medications as of 10/07/2019  Medication Sig  . Acetaminophen-Codeine (TYLENOL WITH CODEINE #3 PO) Take by mouth once a week.  . camphor-menthol (SARNA) lotion Apply 1 application topically as needed for itching.  . furosemide (LASIX) 40 MG tablet Take 1 tablet (40 mg total) by mouth 2 (two) times daily.  . metoprolol tartrate (LOPRESSOR) 25 MG tablet Take 1 tablet (25 mg total) by mouth 2 (two) times daily.  . potassium chloride (KLOR-CON) 10 MEQ tablet Take 1 tablet (10 mEq total) by mouth 2 (two) times daily.  . pravastatin (PRAVACHOL) 40 MG tablet Take 1 tablet (40 mg total) by mouth daily at 6 PM.  . ramelteon (ROZEREM) 8 MG tablet Take 1 tablet (8 mg total) by mouth at bedtime.  Marland Kitchen warfarin (COUMADIN) 2.5 MG tablet Take 1 tablet (2.5 mg total) by mouth daily.   No facility-administered encounter medications on  file as of 10/07/2019.     Review of Systems  Review of Systems  Constitutional: Positive for fatigue. Negative for activity change and fever.  HENT: Negative for sinus pressure, sinus pain and sore throat.   Respiratory: Positive for shortness of breath. Negative for cough and wheezing.   Cardiovascular: Positive for leg swelling (Lymphedema, lower extremities bilaterally, follows up in wound clinic once a week). Negative for chest pain and palpitations.  Gastrointestinal: Negative for diarrhea, nausea and vomiting.  Musculoskeletal: Negative for arthralgias.  Neurological: Negative for dizziness.  Psychiatric/Behavioral: Positive for sleep disturbance. The patient is not nervous/anxious.      Physical  Exam  BP (!) 150/72 (BP Location: Left Arm, Cuff Size: Large)   Pulse 96   Temp (!) 97.3 F (36.3 C) (Temporal)   Ht 5\' 1"  (1.549 m)   Wt 278 lb 12.8 oz (126.5 kg)   SpO2 94% Comment: RA  BMI 52.68 kg/m   Wt Readings from Last 5 Encounters:  10/07/19 278 lb 12.8 oz (126.5 kg)  09/24/19 279 lb (126.6 kg)  09/16/19 283 lb 1.9 oz (128.4 kg)  09/14/19 282 lb (127.9 kg)  08/26/19 290 lb (131.5 kg)    BMI Readings from Last 5 Encounters:  10/07/19 52.68 kg/m  09/24/19 52.72 kg/m  09/16/19 53.50 kg/m  09/14/19 53.28 kg/m  08/26/19 53.04 kg/m     Physical Exam Vitals and nursing note reviewed.  Constitutional:      General: She is not in acute distress.    Appearance: Normal appearance. She is obese.  HENT:     Head: Normocephalic and atraumatic.     Right Ear: Tympanic membrane, ear canal and external ear normal. There is no impacted cerumen.     Left Ear: Tympanic membrane, ear canal and external ear normal. There is no impacted cerumen.     Nose: Nose normal. No congestion.     Mouth/Throat:     Mouth: Mucous membranes are moist.     Pharynx: Oropharynx is clear.  Eyes:     Pupils: Pupils are equal, round, and reactive to light.  Cardiovascular:     Rate  and Rhythm: Normal rate and regular rhythm.     Pulses: Normal pulses.     Heart sounds: Normal heart sounds. No murmur.  Pulmonary:     Effort: Pulmonary effort is normal. No respiratory distress.     Breath sounds: Normal breath sounds. No decreased air movement. No decreased breath sounds, wheezing or rales.  Abdominal:     General: Abdomen is flat. Bowel sounds are normal.     Palpations: Abdomen is soft.  Musculoskeletal:     Cervical back: Normal range of motion.     Right lower leg: Edema present.     Left lower leg: Edema present.  Skin:    General: Skin is warm and dry.     Capillary Refill: Capillary refill takes less than 2 seconds.     Comments: Lower extremities currently wrapped laterally -follows up with wound clinic once a week, lymphedema bilaterally  Neurological:     General: No focal deficit present.     Mental Status: She is alert and oriented to person, place, and time. Mental status is at baseline.     Gait: Gait abnormal (Shuffling gait).  Psychiatric:        Mood and Affect: Mood normal.        Behavior: Behavior normal.        Thought Content: Thought content normal.        Judgment: Judgment normal.       Assessment & Plan:   Chronic diastolic CHF (congestive heart failure) (HCC) Plan: Continue follow-up with cardiology  Sleep apnea Patient reports that she is using her BiPAP occasionally Patient does not use it every night We do not have a compliance report  Plan: Please present to our office with the SD card or present to your DME company with the SD card  Start using BiPAP every night  Follow-up in 6 to 8 weeks with Dr. Ander Slade  Edema of both legs Lymphedema Follows in wound clinic weekly  Plan: Keep  follow-up with wound clinic      Morbid obesity ([place]) Plan: Continue to work on losing weight Follow a low carbohydrate diet Work to reduce BMI  Insomnia Plan: Continue Rozerem  Emphasized need to use BiPAP nightly     Return in about 2 months (around 12/05/2019), or if symptoms worsen or fail to improve, for Follow up with Dr. Ander Slade.   Lauraine Rinne, NP 10/07/2019   This appointment required 35 minutes of patient care (this includes precharting, chart review, review of results, face-to-face care, etc.).

## 2019-10-08 ENCOUNTER — Encounter: Payer: Medicare Other | Admitting: Physician Assistant

## 2019-10-08 ENCOUNTER — Ambulatory Visit (INDEPENDENT_AMBULATORY_CARE_PROVIDER_SITE_OTHER): Payer: Medicare Other | Admitting: Pharmacist Clinician (PhC)/ Clinical Pharmacy Specialist

## 2019-10-08 ENCOUNTER — Encounter: Payer: BC Managed Care – PPO | Admitting: Occupational Therapy

## 2019-10-08 DIAGNOSIS — Z7901 Long term (current) use of anticoagulants: Secondary | ICD-10-CM | POA: Diagnosis not present

## 2019-10-08 DIAGNOSIS — G459 Transient cerebral ischemic attack, unspecified: Secondary | ICD-10-CM

## 2019-10-08 DIAGNOSIS — L97822 Non-pressure chronic ulcer of other part of left lower leg with fat layer exposed: Secondary | ICD-10-CM | POA: Diagnosis not present

## 2019-10-08 DIAGNOSIS — Z954 Presence of other heart-valve replacement: Secondary | ICD-10-CM

## 2019-10-08 LAB — POCT INR: INR: 1.7 — AB (ref 2.0–3.0)

## 2019-10-08 NOTE — Progress Notes (Addendum)
[name] ([id number]) Visit Report for 10/08/2019 Chief Complaint Document Details Patient Name: [name]. Date of Service: 10/08/2019 10:15 AM Medical Record Number: [id number] Patient Account Number: [id number] Date of Birth/Sex: Feb 27, 1956 (64 y.o. F) Treating RN: Cornell Barman Primary Care Provider: Neta Ehlers Other Clinician: Referring Provider: Neta Ehlers Treating Provider/Extender: Melburn Hake, Telma Pyeatt Weeks in Treatment: 6 Information Obtained from: Patient Chief Complaint Bilateral LE Lymphedema and ulcers Electronic Signature(s) Signed: 10/08/2019 10:39:05 AM By: Worthy Keeler PA-C Entered By: Worthy Keeler on 10/08/2019 10:39:05 [name], [name] ([id number]) -------------------------------------------------------------------------------- HPI Details Patient Name: Martha Downs L. Date of Service: 10/08/2019 10:15 AM Medical Record Number: [id number] Patient Account Number: [id number] Date of Birth/Sex: 10-Aug-1956 (64 y.o. F) Treating RN: Cornell Barman Primary Care Provider: Neta Ehlers Other Clinician: Referring Provider: Neta Ehlers Treating Provider/Extender: Sharalyn Ink in Treatment: 6 History of Present Illness HPI Description: 08/27/2019 patient presents today for initial evaluation here in our clinic concerning issues that she has been having with her bilateral lower extremities with lymphedema and weeping. She is previously been a patient over the past year of Dr. Jerilee Hoh at the wound care center in [facility]. With that being said she subsequently was recommended by the lymphedema clinic here in [place] to follow-up with [place] here instead of going back to [place] since they were in closer contact with the clinic here than they are obviously. With that being said the patient states that is why she came her way in order to be evaluated at this point. Fortunately there is no signs of active infection at this time. No  fevers, chills, nausea, vomiting, or diarrhea. The patient does have a history of lymphedema, hypertension, long-term use of anticoagulant therapy, and obstructive sleep apnea. During the visit today and from checking into the discharge she was prone to unexpectedly falling asleep. I did discuss this with her daughter as well who states that this is very common for her she states she is not really sure how compliant her mother is with her CPAP machine that may be part of the issue. The patient did have ABIs performed which revealed have copies of his well today that was on 05/01/2019 and it showed a right ABI of 1.17 with a TBI of 1.12 and a left ABI of 1.24 with a TBI of 0.96. She has had 2 transfusions in the past 2 weeks apparently as well. 08/31/2019 upon evaluation today patient actually appears to be doing somewhat better in regard to her lower extremity ulcers. She still has a tremendous amount of drainage from the left lower extremity unfortunately but her right lower extremity is doing much better which is great news. Overall I am very pleased in this regard. With regard to home health we finally got things settled so home health will be coming out to see her this would be very beneficial in my opinion as I think they will be able to help her tremendously with more frequent dressing changes. 12/28-Patient is back here at 1 week visit, with regard to her bilateral lower extremity ulcers, she has a significant degree of lymphedema, significant degree of drainage more from the left and which continues to be an issue, patient had not been using her lymphedema pumps as recommended, she is concerned about amount of drainage and how quickly it soaks the dressing, she is concerned about the lymphedema pump sticking to her skin and was told to use it on the wraps.  She will get in touch with her cardiologist about adjusting her Lasix dose. 09/14/2019 on evaluation today patient appears to be doing  poorly in regard to her lower extremities especially left lower extremity. Unfortunately she is continue to have a significant amount of drainage which is preventing her from being able to heal keeping the wounds very macerated. She also has some warmth to touch in the anterior to medial portion of the left lower extremity compared to the remainder of the wound which does have made somewhat concerned about the possibility of cellulitis. Obviously it can be difficult to determine just strictly lymphedema versus cellulitis but right now I am somewhat concerned. She tells me that she can put a wrap on and at least has to change this twice a day or else she will be walking around leaving puddles of water behind her. Obviously this is fairly significant. She did see her primary care provider they did not see anything going on that they felt needed to be addressed from a diuretic standpoint but nonetheless I am still somewhat concerned based on what we are seeing to be honest. The patient and her husband are likewise concerned. 09/17/2019 patient is seen today in follow-up I last saw her on Tuesday at that point based on the severity of her lower extremity edema, weeping, and erythema I did send her to the ER for further evaluation and treatment. She subsequently did go for that evaluation. It did appear that based on my review of the notes this is dated 09/14/2019 that the patient did have a BNP of 149.4. Which was notable. Subsequently she was given a dose of IV vancomycin as well as doxycycline and Keflex. It appears however that she was discharged on oral clindamycin 150 mg every 6 hours and to be honest compared to what I saw on Tuesday she actually seems to be doing somewhat better. Fortunately there is no evidence of active infection at this time which is good news. In the interim she did yesterday see her cardiologist as well Dr. Bettina Gavia. That was on 09/16/2019. Upon review of this note it does appear  that his recommendation at this point was to increase the furosemide to 40 mg 2 times a day. He also recommend an increase of potassium 10 mg taken 2 times a day. His other recommendation was that the patient needs to have a follow-up with her hematology oncologist in order to see if they can do anything about iron transfusions for her as unfortunately her iron is still quite low and obviously oral supplementation is not going to be the best Dreier, [place] ([id number]) way to get this up. 09/24/2019 upon evaluation today patient appears to be doing quite well with regard to her lower extremities. The right lower extremity in particular has dramatically improved to be honest so has the left in my opinion. There does not appear to be any signs of active infection which is good news. No fevers, chills, nausea, vomiting, or diarrhea. Patient is still taking the clindamycin at this time. 10/01/2019 on evaluation today patient appears to be showing some signs of improvement at this point in regard to her lower extremities. The right lower extremity is doing quite well the left lower extremity is still draining but again I do believe this is significantly better compared to what it was in the past. Fortunately there is no evidence of active infection at this time. No fever chills noted 10/08/2019 upon evaluation today patient appears to have smaller  areas of erythema and weeping on the left lower extremity as compared to last week's evaluation. The right leg is doing excellent. With that being said unfortunately she is still having a lot of significant swelling on the left in fact the swelling is worse today than it was last week. With that being said she is telling me that she is able to keep her wraps on now for at least the day sometimes even up to 30 hours before they are having to be changed. When they do have to change it they are using her lymphedema wraps at home to reapply what they can. She  only changes that she tells me when it actually gets to the point that is draining so much that it leaks onto her floor. Electronic Signature(s) Signed: 10/08/2019 11:54:12 AM By: Worthy Keeler PA-C Entered By: Worthy Keeler on 10/08/2019 11:54:11 Helbling, [name] ([id number]) -------------------------------------------------------------------------------- Physical Exam Details Patient Name: RUTHMARIE, ASTURIAS L. Date of Service: 10/08/2019 10:15 AM Medical Record Number: [id number] Patient Account Number: [id number] Date of Birth/Sex: 18-Mar-1956 (64 y.o. F) Treating RN: Cornell Barman Primary Care Provider: Neta Ehlers Other Clinician: Referring Provider: Neta Ehlers Treating Provider/Extender: Melburn Hake, Kathye Cipriani Weeks in Treatment: 6 Constitutional Well-nourished and well-hydrated in no acute distress. Respiratory normal breathing without difficulty. Psychiatric this patient is able to make decisions and demonstrates good insight into disease process. Alert and Oriented x 3. pleasant and cooperative. Notes Upon inspection patient's legs actually show signs of excellent improvement as far as overall appearance is concerned right now. There does not appear to be any signs of active infection and I do believe that though she still having a lot of weeping there does not appear to be any signs of significant or really at all infection. No sharp debridement was necessary today. Electronic Signature(s) Signed: 10/08/2019 11:55:09 AM By: Worthy Keeler PA-C Entered By: Worthy Keeler on 10/08/2019 11:55:09 Erck, [name] ([id number]) -------------------------------------------------------------------------------- Physician Orders Details Patient Name: [name]. Date of Service: 10/08/2019 10:15 AM Medical Record Number: [id number] Patient Account Number: [id number] Date of Birth/Sex: 04/21/1956 (64 y.o. 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 - Do not get your wraps wet Wound #2 Right,Circumferential Lower Leg o Cleanse wound with mild soap and water o May shower with protection. - Do not get your wraps wet Primary Wound Dressing Wound #1 Left,Circumferential Lower Leg o Silver Alginate Wound #2 Right,Circumferential Lower Leg o Silver Alginate Secondary Dressing Wound #1 Left,Circumferential Lower Leg o XtraSorb Wound #2 Right,Circumferential Lower Leg o XtraSorb Dressing Change Frequency Wound #1 Left,Circumferential Lower Leg o Change Dressing Monday, Wednesday, Friday Wound #2 Right,Circumferential Lower Leg o Change Dressing Monday, Wednesday, Friday Follow-up Appointments Wound #1 Left,Circumferential Lower Leg o Return Appointment in 1 week. Fitzsimmons, [place] ([id number]) Wound #2 Right,Circumferential Lower Leg o Return Appointment in 1 week. Edema Control Wound #1 Left,Circumferential Lower Leg o 4 Layer  Compression System - Bilateral Wound #2 Right,Circumferential Lower Leg o 4 Layer Compression System - Bilateral Home Health Wound #1 Left,Circumferential Lower Leg o Continue Home Health Visits - [place] Nurse may visit PRN to address patientos wound care needs. o FACE TO FACE ENCOUNTER: MEDICARE and MEDICAID PATIENTS: I certify that  this patient is under my care and that I had a face-to-face encounter that meets the physician face-to-face encounter requirements with this patient on this date. The encounter with the patient was in whole or in part for the following MEDICAL CONDITION: (primary reason for [place]) MEDICAL NECESSITY: I certify, that based on my findings, NURSING services are a medically necessary home health service. HOME BOUND STATUS: I certify that my clinical findings support that this patient is homebound (i.e., Due to illness or injury, pt requires aid of supportive devices such as crutches, cane, wheelchairs, walkers, the use of special transportation or the assistance of another person to leave their place of residence. There is a normal inability to leave the home and doing so requires considerable and taxing effort. Other absences are for medical reasons / religious services and are infrequent or of short duration when for other reasons). o If current dressing causes regression in wound condition, may D/C ordered dressing product/s and apply Normal Saline Moist Dressing daily until next [place] / Other MD appointment. [place] of regression in wound condition at [phone]. o Please direct any NON-WOUND related issues/requests for orders to patient's Primary Care Physician Wound #2 Right,Circumferential Lower Leg o [place] Visits - [place] Nurse may visit PRN to address patientos wound care needs. o FACE TO FACE ENCOUNTER: MEDICARE and MEDICAID PATIENTS: I certify that this patient is under my care and that I had a face-to-face encounter that meets the physician face-to-face encounter requirements with this patient on this date. The encounter with the patient was in whole or in part for the following MEDICAL CONDITION: (primary reason for [place]) MEDICAL NECESSITY: I certify, that based on my findings, NURSING  services are a medically necessary home health service. HOME BOUND STATUS: I certify that my clinical findings support that this patient is homebound (i.e., Due to illness or injury, pt requires aid of supportive devices such as crutches, cane, wheelchairs, walkers, the use of special transportation or the assistance of another person to leave their place of residence. There is a normal inability to leave the home and doing so requires considerable and taxing effort. Other absences are for medical reasons / religious services and are infrequent or of short duration when for other reasons). o If current dressing causes regression in wound condition, may D/C ordered dressing product/s and apply Normal Saline Moist Dressing daily until next [place] / Other MD appointment. [place] of regression in wound condition at [phone]. o Please direct any NON-WOUND related issues/requests for orders to patient's Primary Care Physician Electronic Signature(s) Signed: 10/08/2019 12:19:09 PM By: Worthy Keeler PA-C Signed: 10/14/2019 5:22:08 PM By: Gretta Cool, BSN, RN, CWS, Kim RN, BSN Entered By: Gretta Cool, BSN, RN, CWS, Kim on 10/08/2019 10:52:15 Squyres, [name] ([id number]) -------------------------------------------------------------------------------- Problem List Details Patient Name: RADHA, LAGER. Date of Service: 10/08/2019 10:15 AM Medical Record Number: [id number] Patient Account Number: [id number] Date of Birth/Sex: Jan 13, 1956 (64 y.o. F) Treating RN: Cornell Barman Primary Care Provider: Neta Ehlers Other Clinician: Referring Provider: Neta Ehlers Treating Provider/Extender: Melburn Hake, Jonesha Tsuchiya Weeks in Treatment:  6 Active Problems ICD-10 Evaluated Encounter Code Description Active Date Today Diagnosis I89.0 Lymphedema, not elsewhere classified 08/27/2019 No Yes L97.812 Non-pressure chronic ulcer of other part of right lower leg 08/27/2019 No Yes with fat  layer exposed L97.822 Non-pressure chronic ulcer of other part of left lower leg with 08/27/2019 No Yes fat layer exposed I10 Essential (primary) hypertension 08/27/2019 No Yes Z79.01 Long term (current) use of anticoagulants 08/27/2019 No Yes Inactive Problems Resolved Problems Electronic Signature(s) Signed: 10/08/2019 10:38:59 AM By: Worthy Keeler PA-C Entered By: Worthy Keeler on 10/08/2019 10:38:59 Ram, Aujanae L. ([id number]) -------------------------------------------------------------------------------- Progress Note Details Patient Name: Martha Downs L. Date of Service: 10/08/2019 10:15 AM Medical Record Number: [id number] Patient Account Number: [id number] Date of Birth/Sex: 1956/02/02 (64 y.o. F) Treating RN: Cornell Barman Primary Care Provider: Neta Ehlers Other Clinician: Referring Provider: Neta Ehlers Treating Provider/Extender: Sharalyn Ink in Treatment: 6 Subjective Chief Complaint Information obtained from Patient Bilateral LE Lymphedema and ulcers History of Present Illness (HPI) 08/27/2019 patient presents today for initial evaluation here in our clinic concerning issues that she has been having with her bilateral lower extremities with lymphedema and weeping. She is previously been a patient over the past year of Dr. Jerilee Hoh at the wound care center in [facility]. With that being said she subsequently was recommended by the lymphedema clinic here in [place] to follow-up with [place] here instead of going back to [place] since they were in closer contact with the clinic here than they are obviously. With that being said the patient states that is why she came her way in order to be evaluated at this point. Fortunately there is no signs of active infection at this time. No fevers, chills, nausea, vomiting, or diarrhea. The patient does have a history of lymphedema, hypertension, long-term use of anticoagulant therapy, and obstructive  sleep apnea. During the visit today and from checking into the discharge she was prone to unexpectedly falling asleep. I did discuss this with her daughter as well who states that this is very common for her she states she is not really sure how compliant her mother is with her CPAP machine that may be part of the issue. The patient did have ABIs performed which revealed have copies of his well today that was on 05/01/2019 and it showed a right ABI of 1.17 with a TBI of 1.12 and a left ABI of 1.24 with a TBI of 0.96. She has had 2 transfusions in the past 2 weeks apparently as well. 08/31/2019 upon evaluation today patient actually appears to be doing somewhat better in regard to her lower extremity ulcers. She still has a tremendous amount of drainage from the left lower extremity unfortunately but her right lower extremity is doing much better which is great news. Overall I am very pleased in this regard. With regard to home health we finally got things settled so home health will be coming out to see her this would be very beneficial in my opinion as I think they will be able to help her tremendously with more frequent dressing changes. 12/28-Patient is back here at 1 week visit, with regard to her bilateral lower extremity ulcers, she has a significant degree of lymphedema, significant degree of drainage more from the left and which continues to be an issue, patient had not been using her lymphedema pumps as recommended, she is concerned about amount of drainage and how quickly it soaks the dressing, she is concerned about the  lymphedema pump sticking to her skin and was told to use it on the wraps. She will get in touch with her cardiologist about adjusting her Lasix dose. 09/14/2019 on evaluation today patient appears to be doing poorly in regard to her lower extremities especially left lower extremity. Unfortunately she is continue to have a significant amount of drainage which is preventing her  from being able to heal keeping the wounds very macerated. She also has some warmth to touch in the anterior to medial portion of the left lower extremity compared to the remainder of the wound which does have made somewhat concerned about the possibility of cellulitis. Obviously it can be difficult to determine just strictly lymphedema versus cellulitis but right now I am somewhat concerned. She tells me that she can put a wrap on and at least has to change this twice a day or else she will be walking around leaving puddles of water behind her. Obviously this is fairly significant. She did see her primary care provider they did not see anything going on that they felt needed to be addressed from a diuretic standpoint but nonetheless I am still somewhat concerned based on what we are seeing to be honest. The patient and her husband are likewise concerned. 09/17/2019 patient is seen today in follow-up I last saw her on Tuesday at that point based on the severity of her lower extremity edema, weeping, and erythema I did send her to the ER for further evaluation and treatment. She subsequently did go for that evaluation. It did appear that based on my review of the notes this is dated 09/14/2019 that the patient did have a BNP of 149.4. Which was notable. Subsequently she was given a dose of IV vancomycin as well as doxycycline and Keflex. It appears however that she was discharged on oral clindamycin 150 mg every 6 hours and to be honest compared to what I saw on Tuesday she actually seems to be doing somewhat better. Fortunately there is no evidence of active infection at this time [name], [name]. ([id number]) which is good news. In the interim she did yesterday see her cardiologist as well Dr. Bettina Gavia. That was on 09/16/2019. Upon review of this note it does appear that his recommendation at this point was to increase the furosemide to 40 mg 2 times a day. He also recommend an increase of potassium 10 mg  taken 2 times a day. His other recommendation was that the patient needs to have a follow-up with her hematology oncologist in order to see if they can do anything about iron transfusions for her as unfortunately her iron is still quite low and obviously oral supplementation is not going to be the best way to get this up. 09/24/2019 upon evaluation today patient appears to be doing quite well with regard to her lower extremities. The right lower extremity in particular has dramatically improved to be honest so has the left in my opinion. There does not appear to be any signs of active infection which is good news. No fevers, chills, nausea, vomiting, or diarrhea. Patient is still taking the clindamycin at this time. 10/01/2019 on evaluation today patient appears to be showing some signs of improvement at this point in regard to her lower extremities. The right lower extremity is doing quite well the left lower extremity is still draining but again I do believe this is significantly better compared to what it was in the past. Fortunately there is no evidence of active infection at  this time. No fever chills noted 10/08/2019 upon evaluation today patient appears to have smaller areas of erythema and weeping on the left lower extremity as compared to last week's evaluation. The right leg is doing excellent. With that being said unfortunately she is still having a lot of significant swelling on the left in fact the swelling is worse today than it was last week. With that being said she is telling me that she is able to keep her wraps on now for at least the day sometimes even up to 30 hours before they are having to be changed. When they do have to change it they are using her lymphedema wraps at home to reapply what they can. She only changes that she tells me when it actually gets to the point that is draining so much that it leaks onto her floor. Objective Constitutional Well-nourished and  well-hydrated in no acute distress. Vitals Time Taken: 10:24 AM, Height: 62 in, Weight: 290 lbs, BMI: 53, Temperature: 97.8 F, Pulse: 84 bpm, Respiratory Rate: 18 breaths/min, Blood Pressure: 168/55 mmHg. Respiratory normal breathing without difficulty. Psychiatric this patient is able to make decisions and demonstrates good insight into disease process. Alert and Oriented x 3. pleasant and cooperative. General Notes: Upon inspection patient's legs actually show signs of excellent improvement as far as overall appearance is concerned right now. There does not appear to be any signs of active infection and I do believe that though she still having a lot of weeping there does not appear to be any signs of significant or really at all infection. No sharp debridement was necessary today. Integumentary (Hair, Skin) Wound #1 status is Open. Original cause of wound was Gradually Appeared. The wound is located on the Left,Circumferential Lower Leg. The wound measures 16cm length x 25cm width x 0.1cm depth; 314.159cm^2 area and 31.416cm^3 volume. [name], [name] Wound #2 status is Open. Original cause of wound was Gradually Appeared. The wound is located on the Right,Circumferential Lower Leg. The wound measures 2cm length x 2cm width x 0.1cm depth; 3.142cm^2 area and 0.314cm^3 volume. Other Condition(s) Patient presents with Lymphedema located on the Bilateral Leg. Assessment Active Problems ICD-10 Lymphedema, not elsewhere classified Non-pressure chronic ulcer of other part of right lower leg with fat layer exposed Non-pressure chronic ulcer of other part of left lower leg with fat layer exposed Essential (primary) hypertension Long term (current) use of anticoagulants Plan Wound Cleansing: Wound #1 Left,Circumferential Lower Leg: Cleanse wound with mild soap and water May shower with protection. - Do not get your wraps wet Wound #2 Right,Circumferential Lower Leg: Cleanse  wound with mild soap and water May shower with protection. - Do not get your wraps wet Primary Wound Dressing: Wound #1 Left,Circumferential Lower Leg: Silver Alginate Wound #2 Right,Circumferential Lower Leg: Silver Alginate Secondary Dressing: Wound #1 Left,Circumferential Lower Leg: XtraSorb Wound #2 Right,Circumferential Lower Leg: XtraSorb Dressing Change Frequency: Wound #1 Left,Circumferential Lower Leg: Change Dressing Monday, Wednesday, Friday Wound #2 Right,Circumferential Lower Leg: Change Dressing Monday, Wednesday, Friday Follow-up Appointments: Wound #1 Left,Circumferential Lower Leg: Return Appointment in 1 week. Wound #2 Right,Circumferential Lower Leg: Return Appointment in 1 week. Edema Control: Wound #1 Left,Circumferential Lower Leg: 4 Layer Compression System - Bilateral [name], [name] Wound #2 Right,Circumferential Lower Leg: 4 Layer Compression System - Bilateral Home Health: Wound #1 Left,Circumferential Lower Leg: Continue Home Health Visits - [place] Nurse may visit PRN to address patient s wound care needs. FACE TO FACE ENCOUNTER: MEDICARE  and MEDICAID PATIENTS: I certify that this patient is under my care and that I had a face-to-face encounter that meets the physician face-to-face encounter requirements with this patient on this date. The encounter with the patient was in whole or in part for the following MEDICAL CONDITION: (primary reason for [place]) MEDICAL NECESSITY: I certify, that based on my findings, NURSING services are a medically necessary home health service. HOME BOUND STATUS: I certify that my clinical findings support that this patient is homebound (i.e., Due to illness or injury, pt requires aid of supportive devices such as crutches, cane, wheelchairs, walkers, the use of special transportation or the assistance of another person to leave their place of residence. There is a normal inability to  leave the home and doing so requires considerable and taxing effort. Other absences are for medical reasons / religious services and are infrequent or of short duration when for other reasons). If current dressing causes regression in wound condition, may D/C ordered dressing product/s and apply Normal Saline Moist Dressing daily until next [place] / Other MD appointment. [place] of regression in wound condition at [phone]. Please direct any NON-WOUND related issues/requests for orders to patient's Primary Care Physician Wound #2 Right,Circumferential Lower Leg: [place] Visits - [place] Nurse may visit PRN to address patient s wound care needs. FACE TO FACE ENCOUNTER: MEDICARE and MEDICAID PATIENTS: I certify that this patient is under my care and that I had a face-to-face encounter that meets the physician face-to-face encounter requirements with this patient on this date. The encounter with the patient was in whole or in part for the following MEDICAL CONDITION: (primary reason for [place]) MEDICAL NECESSITY: I certify, that based on my findings, NURSING services are a medically necessary home health service. HOME BOUND STATUS: I certify that my clinical findings support that this patient is homebound (i.e., Due to illness or injury, pt requires aid of supportive devices such as crutches, cane, wheelchairs, walkers, the use of special transportation or the assistance of another person to leave their place of residence. There is a normal inability to leave the home and doing so requires considerable and taxing effort. Other absences are for medical reasons / religious services and are infrequent or of short duration when for other reasons). If current dressing causes regression in wound condition, may D/C ordered dressing product/s and apply Normal Saline Moist Dressing daily until next [place] / Other MD  appointment. [place] of regression in wound condition at [phone]. Please direct any NON-WOUND related issues/requests for orders to patient's Primary Care Physician 1. My suggestion at this time is going to be that we go ahead and continue with the dressings as we have been applying at this point. This is silver alginate followed by Lauraine Rinne. She seems to be doing well and home health is getting XtraSorb as well. 2. I am also going to suggest continuation of 4-layer bilateral compression wrap which seems to be doing well for her obviously the longer she can keep these on the better. 3. I do recommend the patient continue to elevate her legs and especially over the next week she needs to try as much as possible to keep her legs elevated and pressure off. Obviously the more that she can do the better especially since her edema was up on the left this week compared to last week. We will see patient back for reevaluation in 1 week here  in the clinic. If anything worsens or changes patient will contact our office for additional recommendations. Electronic Signature(s) Signed: 10/08/2019 11:56:21 AM By: Worthy Keeler PA-C Entered By: Worthy Keeler on 10/08/2019 11:56:21 [place], [name] ([id number]) -------------------------------------------------------------------------------- SuperBill Details Patient Name: [name]. Date of Service: 10/08/2019 Medical Record Number: [id number] Patient Account Number: [id number] Date of Birth/Sex: 1955-12-16 (64 y.o. F) Treating RN: Cornell Barman Primary Care Provider: Neta Ehlers Other Clinician: Referring Provider: Neta Ehlers Treating Provider/Extender: Melburn Hake, Amore Ackman Weeks in Treatment: 6 Diagnosis Coding ICD-10 Codes Code Description I89.0 Lymphedema, not elsewhere classified L97.812 Non-pressure chronic ulcer of other part of right lower leg with fat layer exposed L97.822 Non-pressure chronic ulcer of other  part of left lower leg with fat layer exposed [place] (primary) hypertension Z79.01 Long term (current) use of anticoagulants Facility Procedures CPT4: Description Modifier Quantity Code [id number] BILATERAL: Application of multi-layer venous compression system; leg (below 1 knee), including ankle and foot. Physician Procedures CPT4 Code Description: [id number] - WC PHYS LEVEL 3 - EST PT ICD-10 Diagnosis Description I89.0 Lymphedema, not elsewhere classified [id number] Non-pressure chronic ulcer of other part of right lower leg wi L97.822 Non-pressure chronic ulcer of other  part of left lower leg wit I10 Essential (primary) hypertension Modifier: th fat layer expos h fat layer expose Quantity: 1 ed d Electronic Signature(s) Signed: 10/11/2019 1:13:15 PM By: Gretta Cool, BSN, RN, CWS, Kim RN, BSN Signed: 10/11/2019 4:06:16 PM By: Worthy Keeler PA-C Previous Signature: 10/08/2019 11:56:35 AM Version By: Worthy Keeler PA-C Entered By: Gretta Cool, BSN, RN, CWS, Kim on 10/11/2019 13:13:14

## 2019-10-13 ENCOUNTER — Ambulatory Visit (INDEPENDENT_AMBULATORY_CARE_PROVIDER_SITE_OTHER): Payer: Medicare Other | Admitting: Pharmacist Clinician (PhC)/ Clinical Pharmacy Specialist

## 2019-10-13 DIAGNOSIS — G459 Transient cerebral ischemic attack, unspecified: Secondary | ICD-10-CM

## 2019-10-13 DIAGNOSIS — Z7901 Long term (current) use of anticoagulants: Secondary | ICD-10-CM | POA: Diagnosis not present

## 2019-10-13 DIAGNOSIS — Z954 Presence of other heart-valve replacement: Secondary | ICD-10-CM

## 2019-10-13 LAB — POCT INR: INR: 2.8 (ref 2.0–3.0)

## 2019-10-14 NOTE — Progress Notes (Signed)
[name], [name] ([id number]) Visit Report for 10/08/2019 Arrival Information Details Patient Name: [name], [name]. Date of Service: 10/08/2019 10:15 AM Medical Record Number: [id number] Patient Account Number: [id number] Date of Birth/Sex: Sep 15, 1955 (64 y.o. F) Treating RN: Cornell Barman Primary Care [name]: Neta Ehlers Other Clinician: Referring Carles Florea: Neta Ehlers Treating Annalysse Shoemaker/Extender: Melburn Hake, HOYT Weeks in Treatment: 6 Visit Information History Since Last Visit Added or deleted any medications: No Patient Arrived: Ambulatory Has Dressing in Place as Prescribed: No Arrival Time: 10:23 Has Compression in Place as Prescribed: No Accompanied By: husband Pain Present Now: No Transfer Assistance: None Patient Identification Verified: Yes Secondary Verification Process Yes Completed: Patient Has Alerts: Yes Patient Alerts: Patient on Blood Thinner warfarin Not Diabetic 04/29/2019 [facility] ABI R)1.17 L)1.24 TBI R) 1.12 L) 0.96 Electronic Signature(s) Signed: 10/14/2019 5:22:08 PM By: Gretta Cool, BSN, RN, CWS, Kim RN, BSN Entered By: Gretta Cool, BSN, RN, CWS, Kim on 10/08/2019 10:32:06 [place], [name] ([id number]) -------------------------------------------------------------------------------- Lower Extremity Assessment Details Patient Name: [name], [name] Date of Service: 10/08/2019 10:15 AM Medical Record Number: [id number] Patient Account Number: [id number] Date of Birth/Sex: 05-24-1956 (64 y.o. F) Treating RN: Cornell Barman Primary Care Merdis Snodgrass: Neta Ehlers Other Clinician: Referring Jameir Ake: Neta Ehlers Treating [place] Marcucci/Extender: Melburn Hake, HOYT Weeks in Treatment: 6 Edema Assessment Assessed: [Left: No] [Right: No] [Left: Edema] [Right: :] Calf Left: Right: Point of Measurement: 32 cm From Medial Instep 63.6 cm 55 cm Ankle Left: Right: Point of Measurement: 10 cm From Medial Instep 36 cm 30.5 cm Electronic Signature(s) Signed: 10/14/2019 5:22:08 PM By:  Gretta Cool, BSN, RN, CWS, Kim RN, BSN Entered By: Gretta Cool, BSN, RN, CWS, Kim on 10/08/2019 10:31:23 [place], [name] ([id number]) -------------------------------------------------------------------------------- Multi Wound Chart Details Patient Name: Martha Downs L. Date of Service: 10/08/2019 10:15 AM Medical Record Number: [id number] Patient Account Number: [id number] Date of Birth/Sex: 04-Mar-1956 (64 y.o. F) Treating RN: Cornell Barman Primary Care Chaundra Abreu: Neta Ehlers Other Clinician: Referring Carole Doner: Neta Ehlers Treating Abbigal Radich/Extender: Melburn Hake, HOYT Weeks in Treatment: 6 Vital Signs Height(in): 62 Pulse(bpm): 84 Weight(lbs): 290 Blood Pressure(mmHg): 168/55 Body Mass Index(BMI): 53 Temperature(F): 97.8 Respiratory Rate 18 (breaths/min): Photos: [1:No Photos] [2:No Photos] [N/A:N/A] Wound Location: [1:Left, Circumferential Lower Leg] [2:Right, Circumferential Lower Leg] [N/A:N/A] Wounding Event: [1:Gradually Appeared] [2:Gradually Appeared] [N/A:N/A] Primary Etiology: [1:Lymphedema] [2:Lymphedema] [N/A:N/A] Date Acquired: [1:08/04/2019] [2:08/04/2019] [N/A:N/A] Weeks of Treatment: [1:6] [2:6] [N/A:N/A] Wound Status: [1:Open] [2:Open] [N/A:N/A] Measurements L x W x D [1:16x25x0.1] [2:2x2x0.1] [N/A:N/A] (cm) Area (cm) : [id number] [2:3.142] [N/A:N/A] Volume (cm) : [1:31.416] [id number] [N/A:N/A] % Reduction in Area: [1:69.50%] [2:98.90%] [N/A:N/A] % Reduction in Volume: [1:69.50% Partial Thickness] [2:98.90% Partial Thickness] [N/A:N/A N/A] Treatment Notes Electronic Signature(s) Signed: 10/14/2019 5:22:08 PM By: Gretta Cool, BSN, RN, CWS, Kim RN, BSN Entered By: Gretta Cool, BSN, RN, CWS, Kim on 10/08/2019 10:32:23 [place], [name] ([id number]) -------------------------------------------------------------------------------- [place] Details Patient Name: [name], [name] Date of Service: 10/08/2019 10:15 AM Medical Record Number: [id number] Patient  Account Number: [id number] Date of Birth/Sex: 1956/08/05 (64 y.o. F) Treating RN: Cornell Barman Primary Care Saray Capasso: Neta Ehlers Other Clinician: Referring Averey Koning: Neta Ehlers Treating Jonatan Wilsey/Extender: Melburn Hake, HOYT Weeks in Treatment: 6 Active Inactive Abuse / Safety / Falls / Self Care Management Nursing Diagnoses: Potential for falls Goals: Patient will not experience any injury related to falls Date Initiated: 08/27/2019 Target Resolution Date: 11/20/2019 Goal Status: Active Interventions: Assess fall risk on admission and as needed Notes: Orientation to the Wound Care Program Nursing Diagnoses: Knowledge deficit  related to the wound healing center program Goals: Patient/caregiver will verbalize understanding of the [place] Date Initiated: 08/27/2019 Target Resolution Date: 11/20/2019 Goal Status: Active Interventions: Provide education on orientation to the wound center Notes: Venous Leg Ulcer Nursing Diagnoses: Potential for venous Insuffiency (use before diagnosis confirmed) Goals: Patient will maintain optimal edema control Date Initiated: 08/27/2019 Target Resolution Date: 11/20/2019 Goal Status: Active Interventions: Assess peripheral edema status every visit. [name], [name] ([id number]) Notes: Wound/Skin Impairment Nursing Diagnoses: Impaired tissue integrity Goals: Ulcer/skin breakdown will heal within 14 weeks Date Initiated: 08/27/2019 Target Resolution Date: 11/20/2019 Goal Status: Active Interventions: Assess patient/caregiver ability to obtain necessary supplies Assess patient/caregiver ability to perform ulcer/skin care regimen upon admission and as needed Assess ulceration(s) every visit Notes: Electronic Signature(s) Signed: 10/14/2019 5:22:08 PM By: Gretta Cool, BSN, RN, CWS, Kim RN, BSN Entered By: Gretta Cool, BSN, RN, CWS, Kim on 10/08/2019 10:32:15 [place], [name]  ([id number]) -------------------------------------------------------------------------------- Non-Wound Condition Assessment Details Patient Name: [name], [name] Date of Service: 10/08/2019 10:15 AM Medical Record Number: [id number] Patient Account Number: [id number] Date of Birth/Sex: March 04, 1956 (64 y.o. F) Treating RN: Cornell Barman Primary Care Kayven Aldaco: Neta Ehlers Other Clinician: Referring Tristy Udovich: Neta Ehlers Treating Reynald Woods/Extender: Melburn Hake, HOYT Weeks in Treatment: 6 Non-Wound Condition: Condition: Lymphedema Location: Leg Side: Bilateral Electronic Signature(s) Signed: 10/14/2019 5:22:08 PM By: Gretta Cool, BSN, RN, CWS, Kim RN, BSN Entered By: Gretta Cool, BSN, RN, CWS, Kim on 10/08/2019 10:51:14 [place], [name] ([id number]) -------------------------------------------------------------------------------- Pain Assessment Details Patient Name: [name], [name] Date of Service: 10/08/2019 10:15 AM Medical Record Number: [id number] Patient Account Number: [id number] Date of Birth/Sex: 05/04/1956 (64 y.o. F) Treating RN: Cornell Barman Primary Care Cy Bresee: Neta Ehlers Other Clinician: Referring Atiba Kimberlin: Neta Ehlers Treating Youlanda Tomassetti/Extender: Melburn Hake, HOYT Weeks in Treatment: 6 Active Problems Location of Pain Severity and Description of Pain Patient Has Paino No Site Locations Pain Management and Medication Current Pain Management: Notes Patient's legs burn a little bit Electronic Signature(s) Signed: 10/14/2019 5:22:08 PM By: Gretta Cool, BSN, RN, CWS, Kim RN, BSN Entered By: Gretta Cool, BSN, RN, CWS, Kim on 10/08/2019 10:24:08 [name] ([id number]) -------------------------------------------------------------------------------- Wound Assessment Details Patient Name: [name], [name] Date of Service: 10/08/2019 10:15 AM Medical Record Number: [id number] Patient Account Number: [id number] Date of Birth/Sex: 06-Jan-1956 (64 y.o. F) Treating RN: Cornell Barman Primary Care  Marzell Isakson: Neta Ehlers Other Clinician: Referring Shakeeta Godette: Neta Ehlers Treating Liya Strollo/Extender: Melburn Hake, HOYT Weeks in Treatment: 6 Wound Status Wound Number: 1 Primary Etiology: Lymphedema Wound Location: Left, Circumferential Lower Leg Wound Status: Open Wounding Event: Gradually Appeared Date Acquired: 08/04/2019 Weeks Of Treatment: 6 Clustered Wound: No Photos Photo Uploaded By: Gretta Cool, BSN, RN, CWS, Kim on 10/08/2019 10:33:09 Wound Measurements Length: (cm) 16 Width: (cm) 25 Depth: (cm) 0.1 Area: (cm) 314.159 Volume: (cm) 31.416 % Reduction in Area: 69.5% % Reduction in Volume: 69.5% Wound Description Classification: Partial Thickness Electronic Signature(s) Signed: 10/14/2019 5:22:08 PM By: Gretta Cool, BSN, RN, CWS, Kim RN, BSN Entered By: Gretta Cool, BSN, RN, CWS, Kim on 10/08/2019 10:27:15 [place], [name] ([id number]) -------------------------------------------------------------------------------- Wound Assessment Details Patient Name: Martha Downs L. Date of Service: 10/08/2019 10:15 AM Medical Record Number: [id number] Patient Account Number: [id number] Date of Birth/Sex: 11-06-1955 (64 y.o. F) Treating RN: Cornell Barman Primary Care Maliik Karner: Neta Ehlers Other Clinician: Referring Jacalynn Buzzell: Neta Ehlers Treating Hamzeh Tall/Extender: Melburn Hake, HOYT Weeks in Treatment: 6 Wound Status Wound Number: 2 Primary Etiology: Lymphedema Wound Location: Right, Circumferential Lower Leg Wound Status: Open  Wounding Event: Gradually Appeared Date Acquired: 08/04/2019 Weeks Of Treatment: 6 Clustered Wound: No Photos Photo Uploaded By: Gretta Cool, BSN, RN, CWS, Kim on 10/08/2019 10:33:10 Wound Measurements Length: (cm) 2 Width: (cm) 2 Depth: (cm) 0.1 Area: (cm) 3.142 Volume: (cm) 0.314 % Reduction in Area: 98.9% % Reduction in Volume: 98.9% Wound Description Classification: Partial Thickness Electronic Signature(s) Signed: 10/14/2019 5:22:08 PM By: Gretta Cool, BSN,  RN, CWS, Kim RN, BSN Entered By: Gretta Cool, BSN, RN, CWS, Kim on 10/08/2019 10:27:15 [place], [name] ([id number]) -------------------------------------------------------------------------------- Vitals Details Patient Name: Martha Downs L. Date of Service: 10/08/2019 10:15 AM Medical Record Number: [id number] Patient Account Number: [id number] Date of Birth/Sex: 1956-01-01 (64 y.o. F) Treating RN: Cornell Barman Primary Care Kayd Launer: Neta Ehlers Other Clinician: Referring Djimon Lundstrom: Neta Ehlers Treating Denim Start/Extender: Melburn Hake, HOYT Weeks in Treatment: 6 Vital Signs Time Taken: 10:24 Temperature (F): 97.8 Height (in): 62 Pulse (bpm): 84 Weight (lbs): 290 Respiratory Rate (breaths/min): 18 Body Mass Index (BMI): 53 Blood Pressure (mmHg): 168/55 Reference Range: 80 - 120 mg / dl Electronic Signature(s) Signed: 10/14/2019 5:22:08 PM By: Gretta Cool, BSN, RN, CWS, Kim RN, BSN Entered By: Gretta Cool, BSN, RN, CWS, Kim on 10/08/2019 10:24:56

## 2019-10-15 ENCOUNTER — Other Ambulatory Visit: Payer: Self-pay

## 2019-10-15 ENCOUNTER — Encounter: Payer: Medicare Other | Attending: Physician Assistant | Admitting: Physician Assistant

## 2019-10-15 DIAGNOSIS — I10 Essential (primary) hypertension: Secondary | ICD-10-CM | POA: Insufficient documentation

## 2019-10-15 DIAGNOSIS — L97822 Non-pressure chronic ulcer of other part of left lower leg with fat layer exposed: Secondary | ICD-10-CM | POA: Diagnosis not present

## 2019-10-15 DIAGNOSIS — G4733 Obstructive sleep apnea (adult) (pediatric): Secondary | ICD-10-CM | POA: Insufficient documentation

## 2019-10-15 DIAGNOSIS — Z881 Allergy status to other antibiotic agents status: Secondary | ICD-10-CM | POA: Insufficient documentation

## 2019-10-15 DIAGNOSIS — I2510 Atherosclerotic heart disease of native coronary artery without angina pectoris: Secondary | ICD-10-CM | POA: Insufficient documentation

## 2019-10-15 DIAGNOSIS — D649 Anemia, unspecified: Secondary | ICD-10-CM | POA: Diagnosis not present

## 2019-10-15 DIAGNOSIS — Z7901 Long term (current) use of anticoagulants: Secondary | ICD-10-CM | POA: Diagnosis not present

## 2019-10-15 DIAGNOSIS — Z6841 Body Mass Index (BMI) 40.0 and over, adult: Secondary | ICD-10-CM | POA: Insufficient documentation

## 2019-10-15 DIAGNOSIS — I890 Lymphedema, not elsewhere classified: Secondary | ICD-10-CM | POA: Insufficient documentation

## 2019-10-15 DIAGNOSIS — E669 Obesity, unspecified: Secondary | ICD-10-CM | POA: Insufficient documentation

## 2019-10-15 DIAGNOSIS — L97812 Non-pressure chronic ulcer of other part of right lower leg with fat layer exposed: Secondary | ICD-10-CM | POA: Insufficient documentation

## 2019-10-15 NOTE — Progress Notes (Addendum)
[name] ([id number]) Visit Report for 10/15/2019 Chief Complaint Document Details Patient Name: Martha, [name]. Date of Service: 10/15/2019 10:15 AM Medical Record Number: [id number] Patient Account Number: [id number] Date of Birth/Sex: 06-Jan-1956 (64 y.o. F) Treating RN: Montey Hora Primary Care Provider: Neta Ehlers Other Clinician: Referring Provider: Neta Ehlers Treating Provider/Extender: Melburn Hake, Neal Trulson Weeks in Treatment: 7 Information Obtained from: Patient Chief Complaint Bilateral LE Lymphedema and ulcers Electronic Signature(s) Signed: 10/15/2019 10:38:28 AM By: Worthy Keeler PA-C Entered By: Worthy Keeler on 10/15/2019 10:38:28 [name], [name] -------------------------------------------------------------------------------- HPI Details Patient Name: Martha Downs L. Date of Service: 10/15/2019 10:15 AM Medical Record Number: [id number] Patient Account Number: [id number] Date of Birth/Sex: 1955-11-19 (64 y.o. F) Treating RN: Montey Hora Primary Care Provider: Neta Ehlers Other Clinician: Referring Provider: Neta Ehlers Treating Provider/Extender: Melburn Hake, Marquite Attwood Weeks in Treatment: 7 History of Present Illness HPI Description: 08/27/2019 patient presents today for initial evaluation here in our clinic concerning issues that she has been having with her bilateral lower extremities with lymphedema and weeping. She is previously been a patient over the past year of Dr. Jerilee Hoh at the wound care center in [facility]. With that being said she subsequently was recommended by the lymphedema clinic here in [place] to follow-up with [place] here instead of going back to [place] since they were in closer contact with the clinic here than they are obviously. With that being said the patient states that is why she came her way in order to be evaluated at this point. Fortunately there is no signs of active infection at this time. No  fevers, chills, nausea, vomiting, or diarrhea. The patient does have a history of lymphedema, hypertension, long-term use of anticoagulant therapy, and obstructive sleep apnea. During the visit today and from checking into the discharge she was prone to unexpectedly falling asleep. I did discuss this with her daughter as well who states that this is very common for her she states she is not really sure how compliant her mother is with her CPAP machine that may be part of the issue. The patient did have ABIs performed which revealed have copies of his well today that was on 05/01/2019 and it showed a right ABI of 1.17 with a TBI of 1.12 and a left ABI of 1.24 with a TBI of 0.96. She has had 2 transfusions in the past 2 weeks apparently as well. 08/31/2019 upon evaluation today patient actually appears to be doing somewhat better in regard to her lower extremity ulcers. She still has a tremendous amount of drainage from the left lower extremity unfortunately but her right lower extremity is doing much better which is great news. Overall I am very pleased in this regard. With regard to home health we finally got things settled so home health will be coming out to see her this would be very beneficial in my opinion as I think they will be able to help her tremendously with more frequent dressing changes. 12/28-Patient is back here at 1 week visit, with regard to her bilateral lower extremity ulcers, she has a significant degree of lymphedema, significant degree of drainage more from the left and which continues to be an issue, patient had not been using her lymphedema pumps as recommended, she is concerned about amount of drainage and how quickly it soaks the dressing, she is concerned about the lymphedema pump sticking to her skin and was told to use it on the wraps.  She will get in touch with her cardiologist about adjusting her Lasix dose. 09/14/2019 on evaluation today patient appears to be doing  poorly in regard to her lower extremities especially left lower extremity. Unfortunately she is continue to have a significant amount of drainage which is preventing her from being able to heal keeping the wounds very macerated. She also has some warmth to touch in the anterior to medial portion of the left lower extremity compared to the remainder of the wound which does have made somewhat concerned about the possibility of cellulitis. Obviously it can be difficult to determine just strictly lymphedema versus cellulitis but right now I am somewhat concerned. She tells me that she can put a wrap on and at least has to change this twice a day or else she will be walking around leaving puddles of water behind her. Obviously this is fairly significant. She did see her primary care provider they did not see anything going on that they felt needed to be addressed from a diuretic standpoint but nonetheless I am still somewhat concerned based on what we are seeing to be honest. The patient and her husband are likewise concerned. 09/17/2019 patient is seen today in follow-up I last saw her on Tuesday at that point based on the severity of her lower extremity edema, weeping, and erythema I did send her to the ER for further evaluation and treatment. She subsequently did go for that evaluation. It did appear that based on my review of the notes this is dated 09/14/2019 that the patient did have a BNP of 149.4. Which was notable. Subsequently she was given a dose of IV vancomycin as well as doxycycline and Keflex. It appears however that she was discharged on oral clindamycin 150 mg every 6 hours and to be honest compared to what I saw on Tuesday she actually seems to be doing somewhat better. Fortunately there is no evidence of active infection at this time which is good news. In the interim she did yesterday see her cardiologist as well Dr. Bettina Gavia. That was on 09/16/2019. Upon review of this note it does appear  that his recommendation at this point was to increase the furosemide to 40 mg 2 times a day. He also recommend an increase of potassium 10 mg taken 2 times a day. His other recommendation was that the patient needs to have a follow-up with her hematology oncologist in order to see if they can do anything about iron transfusions for her as unfortunately her iron is still quite low and obviously oral supplementation is not going to be the best [place], [place] ([id number]) way to get this up. 09/24/2019 upon evaluation today patient appears to be doing quite well with regard to her lower extremities. The right lower extremity in particular has dramatically improved to be honest so has the left in my opinion. There does not appear to be any signs of active infection which is good news. No fevers, chills, nausea, vomiting, or diarrhea. Patient is still taking the clindamycin at this time. 10/01/2019 on evaluation today patient appears to be showing some signs of improvement at this point in regard to her lower extremities. The right lower extremity is doing quite well the left lower extremity is still draining but again I do believe this is significantly better compared to what it was in the past. Fortunately there is no evidence of active infection at this time. No fever chills noted 10/08/2019 upon evaluation today patient appears to have smaller  areas of erythema and weeping on the left lower extremity as compared to last week's evaluation. The right leg is doing excellent. With that being said unfortunately she is still having a lot of significant swelling on the left in fact the swelling is worse today than it was last week. With that being said she is telling me that she is able to keep her wraps on now for at least the day sometimes even up to 30 hours before they are having to be changed. When they do have to change it they are using her lymphedema wraps at home to reapply what they can. She  only changes that she tells me when it actually gets to the point that is draining so much that it leaks onto her floor. 10/15/2019 upon evaluation today patient appears to actually be doing very well with regard to her lower extremities in fact I believe the right lower extremity may be completely sealed up that we will likely watch it 1 more week. The left lower extremity is doing better she does not seem to be having as much weeping and leaking but still this is going on. Home health still has not gotten the [place] there is still just using ABD pads I believe was able to get Lauraine Rinne this would actually help out. Electronic Signature(s) Signed: 10/15/2019 11:17:30 AM By: Worthy Keeler PA-C Entered By: Worthy Keeler on 10/15/2019 11:17:30 L[place], [name] ([id number]) -------------------------------------------------------------------------------- Physical Exam Details Patient Name: [name], [name] Date of Service: 10/15/2019 10:15 AM Medical Record Number: [id number] Patient Account Number: [id number] Date of Birth/Sex: 1956-05-04 (64 y.o. F) Treating RN: Montey Hora Primary Care Provider: Neta Ehlers Other Clinician: Referring Provider: Neta Ehlers Treating Provider/Extender: Melburn Hake, Carmelia Tiner Weeks in Treatment: 7 Constitutional Obese and well-hydrated in no acute distress. Respiratory normal breathing without difficulty. Psychiatric this patient is able to make decisions and demonstrates good insight into disease process. Alert and Oriented x 3. pleasant and cooperative. Notes Patient's wound bed currently was doing extremely well on the left and the right and the right in particular may actually be completely closed up that we will get a monitor this for 1 more week before switching back over to her lymphedema wraps on that side. There is no signs of infection on either leg. Electronic Signature(s) Signed: 10/15/2019 11:17:49 AM By: Worthy Keeler PA-C Entered By:  Worthy Keeler on 10/15/2019 11:17:49 [place], [name] ([id number]) -------------------------------------------------------------------------------- Physician Orders Details Patient Name: Martha Siren. Date of Service: 10/15/2019 10:15 AM Medical Record Number: [id number] Patient Account Number: [id number] Date of Birth/Sex: 05/20/1956 (64 y.o. 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 - Do not get your wraps wet Wound #2 Right,Circumferential Lower Leg o Cleanse wound with mild soap and water o May shower with protection. - Do not get your wraps wet Skin Barriers/Peri-Wound Care o Triamcinolone Acetonide Ointment (TCA) - to left posterior lower leg  Primary Wound Dressing Wound #1 Left,Circumferential Lower Leg o Silver Alginate Wound #2 Right,Circumferential Lower Leg o Silver Alginate Secondary Dressing Wound #1 Left,Circumferential Lower Leg o XtraSorb Wound #2 Right,Circumferential Lower Leg o XtraSorb Dressing Change Frequency Wound #1 Left,Circumferential Lower Leg o Change Dressing Monday, Wednesday, Friday Wound #2 Right,Circumferential Lower Leg o Change Dressing Monday, Wednesday, Friday Follow-up Appointments [place], [place] ([id number]) Wound #1 Left,Circumferential Lower Leg o Return  Appointment in 1 week. Wound #2 Right,Circumferential Lower Leg o Return Appointment in 1 week. Edema Control Wound #1 Left,Circumferential Lower Leg o 4 Layer Compression System - Bilateral Wound #2 Right,Circumferential Lower Leg o 4 Layer Compression System - Bilateral Home Health Wound #1 Left,Circumferential Lower Leg o Continue Home Health Visits - [place] Nurse may visit PRN to address patientos wound care needs. o FACE TO FACE ENCOUNTER: MEDICARE and MEDICAID PATIENTS: I certify that this patient is under my care and that I had a face-to-face encounter that meets the physician face-to-face encounter requirements with this patient on this date. The encounter with the patient was in whole or in part for the following MEDICAL CONDITION: (primary reason for [place]) MEDICAL NECESSITY: I certify, that based on my findings, NURSING services are a medically necessary home health service. HOME BOUND STATUS: I certify that my clinical findings support that this patient is homebound (i.e., Due to illness or injury, pt requires aid of supportive devices such as crutches, cane, wheelchairs, walkers, the use of special transportation or the assistance of another person to leave their place of residence. There is a normal inability to leave the home and doing so requires considerable and taxing effort. Other absences are for medical reasons / religious services and are infrequent or of short duration when for other reasons). o If current dressing causes regression in wound condition, may D/C ordered dressing product/s and apply Normal Saline Moist Dressing daily until next [place] / Other MD appointment. [place] of regression in wound condition at [phone]. o Please direct any NON-WOUND related issues/requests for orders to patient's Primary Care Physician Wound #2 Right,Circumferential Lower Leg o [place]  Visits - [place] Nurse may visit PRN to address patientos wound care needs. o FACE TO FACE ENCOUNTER: MEDICARE and MEDICAID PATIENTS: I certify that this patient is under my care and that I had a face-to-face encounter that meets the physician face-to-face encounter requirements with this patient on this date. The encounter with the patient was in whole or in part for the following MEDICAL CONDITION: (primary reason for [place]) MEDICAL NECESSITY: I certify, that based on my findings, NURSING services are a medically necessary home health service. HOME BOUND STATUS: I certify that my clinical findings support that this patient is homebound (i.e., Due to illness or injury, pt requires aid of supportive devices such as crutches, cane, wheelchairs, walkers, the use of special transportation or the assistance of another person to leave their place of residence. There is a normal inability to leave the home and doing so requires considerable and taxing effort. Other absences are for medical reasons / religious services and are infrequent or of short duration when for other reasons). o If current dressing causes regression in wound condition, may D/C ordered dressing product/s and apply Normal Saline Moist Dressing daily until next [place] / Other MD appointment. [place] of regression in wound condition at [phone]. o Please direct any NON-WOUND related issues/requests for  orders to patient's Primary Care Physician Electronic Signature(s) Signed: 10/15/2019 1:41:09 PM By: Montey Hora Signed: 10/15/2019 1:53:22 PM By: Worthy Keeler PA-C Entered By: Montey Hora on 10/15/2019 11:15:18 [place], [name] ([id number]) [place], [name] ([id number]) -------------------------------------------------------------------------------- Problem List Details Patient Name: [name], [name] Date of Service: 10/15/2019 10:15 AM Medical Record Number:  [id number] Patient Account Number: [id number] Date of Birth/Sex: 02-08-1956 (64 y.o. F) Treating RN: Montey Hora Primary Care Provider: Neta Ehlers Other Clinician: Referring Provider: Neta Ehlers Treating Provider/Extender: Melburn Hake, Lynett Brasil Weeks in Treatment: 7 Active Problems ICD-10 Evaluated Encounter Code Description Active Date Today Diagnosis I89.0 Lymphedema, not elsewhere classified 08/27/2019 No Yes L97.812 Non-pressure chronic ulcer of other part of right lower leg 08/27/2019 No Yes with fat layer exposed L97.822 Non-pressure chronic ulcer of other part of left lower leg with 08/27/2019 No Yes fat layer exposed [place] (primary) hypertension 08/27/2019 No Yes Z79.01 Long term (current) use of anticoagulants 08/27/2019 No Yes Inactive Problems Resolved Problems Electronic Signature(s) Signed: 10/15/2019 10:38:23 AM By: Worthy Keeler PA-C Entered By: Worthy Keeler on 10/15/2019 10:38:22 [place], [place] ([id number]) -------------------------------------------------------------------------------- Progress Note Details Patient Name: Martha Downs L. Date of Service: 10/15/2019 10:15 AM Medical Record Number: [id number] Patient Account Number: [id number] Date of Birth/Sex: 30-Dec-1955 (64 y.o. F) Treating RN: Montey Hora Primary Care Provider: Neta Ehlers Other Clinician: Referring Provider: Neta Ehlers Treating Provider/Extender: Melburn Hake, Truly Stankiewicz Weeks in Treatment: 7 Subjective Chief Complaint Information obtained from Patient Bilateral LE Lymphedema and ulcers History of Present Illness (HPI) 08/27/2019 patient presents today for initial evaluation here in our clinic concerning issues that she has been having with her bilateral lower extremities with lymphedema and weeping. She is previously been a patient over the past year of Dr. Jerilee Hoh at the wound care center in [facility]. With that being said she subsequently was  recommended by the lymphedema clinic here in [place] to follow-up with [place] here instead of going back to [place] since they were in closer contact with the clinic here than they are obviously. With that being said the patient states that is why she came her way in order to be evaluated at this point. Fortunately there is no signs of active infection at this time. No fevers, chills, nausea, vomiting, or diarrhea. The patient does have a history of lymphedema, hypertension, long-term use of anticoagulant therapy, and obstructive sleep apnea. During the visit today and from checking into the discharge she was prone to unexpectedly falling asleep. I did discuss this with her daughter as well who states that this is very common for her she states she is not really sure how compliant her mother is with her CPAP machine that may be part of the issue. The patient did have ABIs performed which revealed have copies of his well today that was on 05/01/2019 and it showed a right ABI of 1.17 with a TBI of 1.12 and a left ABI of 1.24 with a TBI of 0.96. She has had 2 transfusions in the past 2 weeks apparently as well. 08/31/2019 upon evaluation today patient actually appears to be doing somewhat better in regard to her lower extremity ulcers. She still has a tremendous amount of drainage from the left lower extremity unfortunately but her right lower extremity is doing much better which is great news. Overall I am very pleased in this regard. With regard to home health we finally got things settled so home health will be coming out  to see her this would be very beneficial in my opinion as I think they will be able to help her tremendously with more frequent dressing changes. 12/28-Patient is back here at 1 week visit, with regard to her bilateral lower extremity ulcers, she has a significant degree of lymphedema, significant degree of drainage more from the left and which continues to be an issue, patient had  not been using her lymphedema pumps as recommended, she is concerned about amount of drainage and how quickly it soaks the dressing, she is concerned about the lymphedema pump sticking to her skin and was told to use it on the wraps. She will get in touch with her cardiologist about adjusting her Lasix dose. 09/14/2019 on evaluation today patient appears to be doing poorly in regard to her lower extremities especially left lower extremity. Unfortunately she is continue to have a significant amount of drainage which is preventing her from being able to heal keeping the wounds very macerated. She also has some warmth to touch in the anterior to medial portion of the left lower extremity compared to the remainder of the wound which does have made somewhat concerned about the possibility of cellulitis. Obviously it can be difficult to determine just strictly lymphedema versus cellulitis but right now I am somewhat concerned. She tells me that she can put a wrap on and at least has to change this twice a day or else she will be walking around leaving puddles of water behind her. Obviously this is fairly significant. She did see her primary care provider they did not see anything going on that they felt needed to be addressed from a diuretic standpoint but nonetheless I am still somewhat concerned based on what we are seeing to be honest. The patient and her husband are likewise concerned. 09/17/2019 patient is seen today in follow-up I last saw her on Tuesday at that point based on the severity of her lower extremity edema, weeping, and erythema I did send her to the ER for further evaluation and treatment. She subsequently did go for that evaluation. It did appear that based on my review of the notes this is dated 09/14/2019 that the patient did have a BNP of 149.4. Which was notable. Subsequently she was given a dose of IV vancomycin as well as doxycycline and Keflex. It appears however that she was  discharged on oral clindamycin 150 mg every 6 hours and to be honest compared to what I saw on Tuesday she actually seems to be doing somewhat better. Fortunately there is no evidence of active infection at this time [name], [name]. ([id number]) which is good news. In the interim she did yesterday see her cardiologist as well Dr. Bettina Gavia. That was on 09/16/2019. Upon review of this note it does appear that his recommendation at this point was to increase the furosemide to 40 mg 2 times a day. He also recommend an increase of potassium 10 mg taken 2 times a day. His other recommendation was that the patient needs to have a follow-up with her hematology oncologist in order to see if they can do anything about iron transfusions for her as unfortunately her iron is still quite low and obviously oral supplementation is not going to be the best way to get this up. 09/24/2019 upon evaluation today patient appears to be doing quite well with regard to her lower extremities. The right lower extremity in particular has dramatically improved to be honest so has the left in my opinion.  There does not appear to be any signs of active infection which is good news. No fevers, chills, nausea, vomiting, or diarrhea. Patient is still taking the clindamycin at this time. 10/01/2019 on evaluation today patient appears to be showing some signs of improvement at this point in regard to her lower extremities. The right lower extremity is doing quite well the left lower extremity is still draining but again I do believe this is significantly better compared to what it was in the past. Fortunately there is no evidence of active infection at this time. No fever chills noted 10/08/2019 upon evaluation today patient appears to have smaller areas of erythema and weeping on the left lower extremity as compared to last week's evaluation. The right leg is doing excellent. With that being said unfortunately she is still having a lot of  significant swelling on the left in fact the swelling is worse today than it was last week. With that being said she is telling me that she is able to keep her wraps on now for at least the day sometimes even up to 30 hours before they are having to be changed. When they do have to change it they are using her lymphedema wraps at home to reapply what they can. She only changes that she tells me when it actually gets to the point that is draining so much that it leaks onto her floor. 10/15/2019 upon evaluation today patient appears to actually be doing very well with regard to her lower extremities in fact I believe the right lower extremity may be completely sealed up that we will likely watch it 1 more week. The left lower extremity is doing better she does not seem to be having as much weeping and leaking but still this is going on. Home health still has not gotten the [place] there is still just using ABD pads I believe was able to get Lauraine Rinne this would actually help out. Objective Constitutional Obese and well-hydrated in no acute distress. Vitals Time Taken: 10:30 AM, Height: 62 in, Weight: 290 lbs, BMI: 53, Temperature: 97.8 F, Pulse: 90 bpm, Respiratory Rate: 18 breaths/min, Blood Pressure: 156/71 mmHg. Respiratory normal breathing without difficulty. Psychiatric this patient is able to make decisions and demonstrates good insight into disease process. Alert and Oriented x 3. pleasant and cooperative. General Notes: Patient's wound bed currently was doing extremely well on the left and the right and the right in particular may actually be completely closed up that we will get a monitor this for 1 more week before switching back over to her lymphedema wraps on that side. There is no signs of infection on either leg. Integumentary (Hair, Skin) Arpin, May[place]arl ([id number]) Wound #1 status is Open. Original cause of wound was Gradually Appeared. The wound is located on  the Left,Circumferential Lower Leg. The wound measures 15cm length x 25cm width x 0.1cm depth; 294.524cm^2 area and 29.452cm^3 volume. There is Fat Layer (Subcutaneous Tissue) Exposed exposed. There is no tunneling or undermining noted. There is a large amount of serous drainage noted. There is large (67-100%) red granulation within the wound bed. There is a small (1-33%) amount of necrotic tissue within the wound bed including Adherent Slough. Wound #2 status is Open. Original cause of wound was Gradually Appeared. The wound is located on the Right,Circumferential Lower Leg. The wound measures 7.5cm length x 9cm width x 0.1cm depth; 53.014cm^2 area and 5.301cm^3 volume. There is Fat Layer (Subcutaneous Tissue) Exposed exposed. There is a medium  amount of serous drainage noted. There is small (1-33%) red granulation within the wound bed. There is a large (67-100%) amount of necrotic tissue within the wound bed including Eschar. Assessment Active Problems ICD-10 Lymphedema, not elsewhere classified Non-pressure chronic ulcer of other part of right lower leg with fat layer exposed Non-pressure chronic ulcer of other part of left lower leg with fat layer exposed Essential (primary) hypertension Long term (current) use of anticoagulants Procedures Wound #1 Pre-procedure diagnosis of Wound #1 is a Lymphedema located on the Left,Circumferential Lower Leg . There was a Four Layer Compression Therapy Procedure with a pre-treatment ABI of 1.2 by Montey Hora, RN. Post procedure Diagnosis Wound #1: Same as Pre-Procedure Plan Wound Cleansing: Wound #1 Left,Circumferential Lower Leg: Cleanse wound with mild soap and water May shower with protection. - Do not get your wraps wet Wound #2 Right,Circumferential Lower Leg: Cleanse wound with mild soap and water May shower with protection. - Do not get your wraps wet Skin Barriers/Peri-Wound Care: Triamcinolone Acetonide Ointment (TCA) - to left  posterior lower leg Primary Wound Dressing: Wound #1 Left,Circumferential Lower Leg: Silver Alginate Wound #2 Right,Circumferential Lower Leg: [name], [name] Silver Alginate Secondary Dressing: Wound #1 Left,Circumferential Lower Leg: XtraSorb Wound #2 Right,Circumferential Lower Leg: XtraSorb Dressing Change Frequency: Wound #1 Left,Circumferential Lower Leg: Change Dressing Monday, Wednesday, Friday Wound #2 Right,Circumferential Lower Leg: Change Dressing Monday, Wednesday, Friday Follow-up Appointments: Wound #1 Left,Circumferential Lower Leg: Return Appointment in 1 week. Wound #2 Right,Circumferential Lower Leg: Return Appointment in 1 week. Edema Control: Wound #1 Left,Circumferential Lower Leg: 4 Layer Compression System - Bilateral Wound #2 Right,Circumferential Lower Leg: 4 Layer Compression System - Bilateral Home Health: Wound #1 Left,Circumferential Lower Leg: Continue Home Health Visits - [place] Nurse may visit PRN to address patient s wound care needs. FACE TO FACE ENCOUNTER: MEDICARE and MEDICAID PATIENTS: I certify that this patient is under my care and that I had a face-to-face encounter that meets the physician face-to-face encounter requirements with this patient on this date. The encounter with the patient was in whole or in part for the following MEDICAL CONDITION: (primary reason for [place]) MEDICAL NECESSITY: I certify, that based on my findings, NURSING services are a medically necessary home health service. HOME BOUND STATUS: I certify that my clinical findings support that this patient is homebound (i.e., Due to illness or injury, pt requires aid of supportive devices such as crutches, cane, wheelchairs, walkers, the use of special transportation or the assistance of another person to leave their place of residence. There is a normal inability to leave the home and doing so requires considerable and taxing effort.  Other absences are for medical reasons / religious services and are infrequent or of short duration when for other reasons). If current dressing causes regression in wound condition, may D/C ordered dressing product/s and apply Normal Saline Moist Dressing daily until next [place] / Other MD appointment. [place] of regression in wound condition at [phone]. Please direct any NON-WOUND related issues/requests for orders to patient's Primary Care Physician Wound #2 Right,Circumferential Lower Leg: [place] Visits - [place] Nurse may visit PRN to address patient s wound care needs. FACE TO FACE ENCOUNTER: MEDICARE and MEDICAID PATIENTS: I certify that this patient is under my care and that I had a face-to-face encounter that meets the physician face-to-face encounter requirements with this patient on this date. The encounter with the patient was in whole or in  part for the following MEDICAL CONDITION: (primary reason for Home Healthcare) MEDICAL NECESSITY: I certify, that based on my findings, NURSING services are a medically necessary home health service. HOME BOUND STATUS: I certify that my clinical findings support that this patient is homebound (i.e., Due to illness or injury, pt requires aid of supportive devices such as crutches, cane, wheelchairs, walkers, the use of special transportation or the assistance of another person to leave their place of residence. There is a normal inability to leave the home and doing so requires considerable and taxing effort. Other absences are for medical reasons / religious services and are infrequent or of short duration when for other reasons). If current dressing causes regression in wound condition, may D/C ordered dressing product/s and apply Normal Saline Moist Dressing daily until next [place] / Other MD appointment. [place] of regression in wound condition  at [phone]. Please direct any NON-WOUND related issues/requests for orders to patient's Primary Care Physician 1. My suggestion at this time is good to be that we continue with the current wound care measures for the next week. The patient does seem to be doing well I do recommend a 4-layer wrap bilaterally and will see where things stand in 1 week's Bellanca, Alfred ([id number]) time. 2. If she still doing well in regard to the right lower extremity I will likely go ahead and discontinue the 4 layer wrap and have her transition back into her lymphedema wraps that they do at home I think that would be appropriate. 3. I am also going to suggest at this time that we go ahead and continue with the elevation of her legs as much as she can to try to keep the edema under control as well. 4. We are getting use some triamcinolone on the legs to try to help with the itching that she is experiencing if she does not have any complications from the use of that today and it feels better than I will likely go ahead and give her a prescription for that to be used at home come next week but I want to make sure that she does not have any concerns or issues with the utilization of this first. We will see patient back for reevaluation in 4 weeks here in the clinic. If anything worsens or changes patient will contact our office for additional recommendations. Electronic Signature(s) Signed: 10/15/2019 11:18:59 AM By: Worthy Keeler PA-C Entered By: Worthy Keeler on 10/15/2019 11:18:59 Spittler, [name] ([id number]) -------------------------------------------------------------------------------- SuperBill Details Patient Name: Martha Siren. Date of Service: 10/15/2019 Medical Record Number: [id number] Patient Account Number: [id number] Date of Birth/Sex: 10/06/1955 (64 y.o. F) Treating RN: Montey Hora Primary Care Provider: Neta Ehlers Other Clinician: Referring Provider: Neta Ehlers Treating  Provider/Extender: Melburn Hake, Abiha Lukehart Weeks in Treatment: 7 Diagnosis Coding ICD-10 Codes Code Description I89.0 Lymphedema, not elsewhere classified L97.812 Non-pressure chronic ulcer of other part of right lower leg with fat layer exposed L97.822 Non-pressure chronic ulcer of other part of left lower leg with fat layer exposed [place] (primary) hypertension Z79.01 Long term (current) use of anticoagulants Facility Procedures CPT4: Description Modifier Quantity Code [id number] BILATERAL: Application of multi-layer venous compression system; leg (below 1 knee), including ankle and foot. Physician Procedures CPT4 Code Description: [id number] - WC PHYS LEVEL 3 - EST PT ICD-10 Diagnosis Description I89.0 Lymphedema, not elsewhere classified L97.812 Non-pressure chronic ulcer of other part of right lower leg wi L97.822 Non-pressure  chronic ulcer of other  part of left lower leg wit I10 Essential (primary) hypertension Modifier: th fat layer expos h fat layer expose Quantity: 1 ed d Electronic Signature(s) Signed: 10/15/2019 11:19:23 AM By: Worthy Keeler PA-C Entered By: Worthy Keeler on 10/15/2019 11:19:22

## 2019-10-15 NOTE — Progress Notes (Signed)
Martha, [name] ([id number]) Visit Report for 10/15/2019 Arrival Information Details Patient Name: [name], [name]. Date of Service: 10/15/2019 10:15 AM Medical Record Number: [id number] Patient Account Number: [id number] Date of Birth/Sex: [birth year] (64 y.o. F) Treating RN: Montey Hora Primary Care Chazz Philson: Neta Ehlers Other Clinician: Referring Genever Hentges: Neta Ehlers Treating Ranie Chinchilla/Extender: Melburn Hake, HOYT Weeks in Treatment: 7 Visit Information History Since Last Visit Added or deleted any medications: No Patient Arrived: Ambulatory Any new allergies or adverse reactions: No Arrival Time: 10:29 Had a fall or experienced change in No Accompanied By: self activities of daily living that may affect Transfer Assistance: None risk of falls: Patient Identification Verified: Yes Signs or symptoms of abuse/neglect since last visito No Secondary Verification Process Yes Hospitalized since last visit: No Completed: Implantable device outside of the clinic excluding No Patient Has Alerts: Yes cellular tissue based products placed in the center Patient Alerts: Patient on Blood since last visit: Thinner Has Dressing in Place as Prescribed: No warfarin Pain Present Now: Yes Not Diabetic 04/29/2019 [facility] ABI R)1.17 L)1.24 TBI R) 1.12 L) 0.96 Electronic Signature(s) Signed: 10/15/2019 2:17:28 PM By: Lorine Bears RCP, RRT, CHT Entered By: Lorine Bears on 10/15/2019 10:30:58 Wyke, [place] ([id number]) -------------------------------------------------------------------------------- Compression Therapy Details Patient Name: Martha Downs L. Date of Service: 10/15/2019 10:15 AM Medical Record Number: [id number] Patient Account Number: [id number] Date of Birth/Sex: 1956-08-28 (64 y.o. F) Treating RN: Montey Hora Primary Care Taylee Gunnells: Neta Ehlers Other Clinician: Referring Rosenda Geffrard: Neta Ehlers Treating Scotti Motter/Extender: Melburn Hake,  HOYT Weeks in Treatment: 7 Compression Therapy Performed for Wound Assessment: Wound #1 Left,Circumferential Lower Leg Performed By: Clinician Montey Hora, RN Compression Type: Four Layer Pre Treatment ABI: 1.2 Post Procedure Diagnosis Same as Pre-procedure Electronic Signature(s) Signed: 10/15/2019 1:41:09 PM By: Montey Hora Entered By: Montey Hora on 10/15/2019 11:14:23 Janvrin, [name] ([id number]) -------------------------------------------------------------------------------- Encounter Discharge Information Details Patient Name: [name], [name] Date of Service: 10/15/2019 10:15 AM Medical Record Number: [id number] Patient Account Number: [id number] Date of Birth/Sex: 03/07/1956 (64 y.o. F) Treating RN: Montey Hora Primary Care Alayia Meggison: Neta Ehlers Other Clinician: Referring Jahlani Lorentz: Neta Ehlers Treating Allysa Governale/Extender: Sharalyn Ink in Treatment: 7 Encounter Discharge Information Items Discharge Condition: Stable Ambulatory Status: Ambulatory Discharge Destination: Home Transportation: Private Auto Accompanied By: self Schedule Follow-up Appointment: Yes Clinical Summary of Care: Electronic Signature(s) Signed: 10/15/2019 1:41:09 PM By: Montey Hora Entered By: Montey Hora on 10/15/2019 11:17:05 Taormina, Norelle L. ([id number]) -------------------------------------------------------------------------------- Lower Extremity Assessment Details Patient Name: Martha Downs L. Date of Service: 10/15/2019 10:15 AM Medical Record Number: [id number] Patient Account Number: [id number] Date of Birth/Sex: [birth year] (64 y.o. F) Treating RN: Army Melia Primary Care Cynara Tatham: Neta Ehlers Other Clinician: Referring Renell Allum: Neta Ehlers Treating Desma Wilkowski/Extender: Melburn Hake, HOYT Weeks in Treatment: 7 Edema Assessment Assessed: [Left: No] [Right: No] Edema: [Left: Yes] [Right: Yes] Calf Left: Right: Point of Measurement: 32 cm From Medial  Instep 62 cm 54 cm Ankle Left: Right: Point of Measurement: 10 cm From Medial Instep 39 cm 31.5 cm Vascular Assessment Pulses: Dorsalis Pedis Palpable: [Left:Yes] [Right:Yes] Electronic Signature(s) Signed: 10/15/2019 12:53:43 PM By: Army Melia Entered By: Army Melia on 10/15/2019 10:42:02 Sofia, [place] L. ([id number]) -------------------------------------------------------------------------------- Multi Wound Chart Details Patient Name: Martha Downs L. Date of Service: 10/15/2019 10:15 AM Medical Record Number: [id number] Patient Account Number: [id number] Date of Birth/Sex: [birth year] (64 y.o. F) Treating RN: Montey Hora Primary Care Akanksha Bellmore: Neta Ehlers Other Clinician: Referring Jaylise Peek: Griffin Basil,  JOHN Treating Aubreyana Saltz/Extender: STONE III, HOYT Weeks in Treatment: 7 Vital Signs Height(in): 62 Pulse(bpm): 90 Weight(lbs): 290 Blood Pressure(mmHg): 156/71 Body Mass Index(BMI): 53 Temperature(F): 97.8 Respiratory Rate 18 (breaths/min): Photos: [N/A:N/A] Wound Location: Left Lower Leg - Right Lower Leg - N/A Circumferential Circumferential Wounding Event: Gradually Appeared Gradually Appeared N/A Primary Etiology: Lymphedema Lymphedema N/A Comorbid History: Anemia, Coronary Artery Anemia, Coronary Artery N/A Disease, Hypertension Disease, Hypertension Date Acquired: 08/04/2019 08/04/2019 N/A Weeks of Treatment: 7 7 N/A Wound Status: Open Open N/A Measurements L x W x D 15x25x0.1 7.5x9x0.1 N/A (cm) Area (cm) : 294.524 53.014 N/A Volume (cm) : 29.452 5.301 N/A % Reduction in Area: 71.40% 81.30% N/A % Reduction in Volume: 71.40% 81.30% N/A Classification: Partial Thickness Partial Thickness N/A Exudate Amount: Large Medium N/A Exudate Type: Serous Serous N/A Exudate Color: amber amber N/A Granulation Amount: Large (67-100%) Small (1-33%) N/A Eversley, Malayia L. ([id number]) Granulation Quality: Red Red N/A Necrotic Amount: Small (1-33%) Large (67-100%)  N/A Necrotic Tissue: Adherent Slough Eschar N/A Exposed Structures: Fat Layer (Subcutaneous Fat Layer (Subcutaneous N/A Tissue) Exposed: Yes Tissue) Exposed: Yes Fascia: No Fascia: No Tendon: No Tendon: No Muscle: No Muscle: No Joint: No Joint: No Bone: No Bone: No Treatment Notes Electronic Signature(s) Signed: 10/15/2019 1:41:09 PM By: Montey Hora Entered By: Montey Hora on 10/15/2019 11:13:24 Kobler, [name] ([id number]) -------------------------------------------------------------------------------- [place] Details Patient Name: [name]. Date of Service: 10/15/2019 10:15 AM Medical Record Number: [id number] Patient Account Number: [id number] Date of Birth/Sex: Apr 01, 1956 (64 y.o. F) Treating RN: Montey Hora Primary Care Jennel Mara: Neta Ehlers Other Clinician: Referring Markia Kyer: Neta Ehlers Treating Tabithia Stroder/Extender: Melburn Hake, HOYT Weeks in Treatment: 7 Active Inactive Abuse / Safety / Falls / Self Care Management Nursing Diagnoses: Potential for falls Goals: Patient will not experience any injury related to falls Date Initiated: 08/27/2019 Target Resolution Date: 11/20/2019 Goal Status: Active Interventions: Assess fall risk on admission and as needed Notes: Orientation to the Wound Care Program Nursing Diagnoses: Knowledge deficit related to the wound healing center program Goals: Patient/caregiver will verbalize understanding of the [place] Program Date Initiated: 08/27/2019 Target Resolution Date: 11/20/2019 Goal Status: Active Interventions: Provide education on orientation to the wound center Notes: Venous Leg Ulcer Nursing Diagnoses: Potential for venous Insuffiency (use before diagnosis confirmed) Goals: Patient will maintain optimal edema control Date Initiated: 08/27/2019 Target Resolution Date: 11/20/2019 Goal Status: Active Interventions: Assess peripheral edema status every  visit. PAIDYN, ASFOUR ([id number]) Notes: Wound/Skin Impairment Nursing Diagnoses: Impaired tissue integrity Goals: Ulcer/skin breakdown will heal within 14 weeks Date Initiated: 08/27/2019 Target Resolution Date: 11/20/2019 Goal Status: Active Interventions: Assess patient/caregiver ability to obtain necessary supplies Assess patient/caregiver ability to perform ulcer/skin care regimen upon admission and as needed Assess ulceration(s) every visit Notes: Electronic Signature(s) Signed: 10/15/2019 1:41:09 PM By: Montey Hora Entered By: Montey Hora on 10/15/2019 11:13:10 Hagos, [place] ([id number]) -------------------------------------------------------------------------------- Pain Assessment Details Patient Name: Martha Downs L. Date of Service: 10/15/2019 10:15 AM Medical Record Number: [id number] Patient Account Number: [id number] Date of Birth/Sex: 1956/07/07 (64 y.o. F) Treating RN: Montey Hora Primary Care Mujahid Jalomo: Neta Ehlers Other Clinician: Referring Micha Erck: Neta Ehlers Treating Dejae Bernet/Extender: Melburn Hake, HOYT Weeks in Treatment: 7 Active Problems Location of Pain Severity and Description of Pain Patient Has Paino Yes Site Locations Rate the pain. Current Pain Level: 1 Pain Management and Medication Current Pain Management: Electronic Signature(s) Signed: 10/15/2019 1:41:09 PM By: Montey Hora Signed: 10/15/2019 2:17:28 PM By: Becky Sax, Sallie RCP,  RRT, CHT Entered By: Lorine Bears on 10/15/2019 10:31:13 Alfrey, [name] ([id number]) -------------------------------------------------------------------------------- Patient/Caregiver Education Details Patient Name: Leonia, [name]. Date of Service: 10/15/2019 10:15 AM Medical Record Number: [id number] Patient Account Number: [id number] Date of Birth/Gender: 06-Feb-1956 (64 y.o. F) Treating RN: Montey Hora Primary Care Physician: Neta Ehlers Other  Clinician: Referring Physician: Neta Ehlers Treating Physician/Extender: Sharalyn Ink in Treatment: 7 Education Assessment Education Provided To: Patient Education Topics Provided Venous: Handouts: Other: keep wraps on Methods: Explain/Verbal Responses: State content correctly Electronic Signature(s) Signed: 10/15/2019 1:41:09 PM By: Montey Hora Entered By: Montey Hora on 10/15/2019 11:15:58 Wanninger, Cheria L. ([id number]) -------------------------------------------------------------------------------- Wound Assessment Details Patient Name: Martha Downs L. Date of Service: 10/15/2019 10:15 AM Medical Record Number: [id number] Patient Account Number: [id number] Date of Birth/Sex: [birth year] (64 y.o. F) Treating RN: Army Melia Primary Care Aivah Putman: Neta Ehlers Other Clinician: Referring Allee Busk: Neta Ehlers Treating Beverlie Kurihara/Extender: Melburn Hake, HOYT Weeks in Treatment: 7 Wound Status Wound Number: 1 Primary Lymphedema Etiology: Wound Location: Left Lower Leg - Circumferential Wound Status: Open Wounding Event: Gradually Appeared Comorbid Anemia, Coronary Artery Disease, Date Acquired: 08/04/2019 History: Hypertension Weeks Of Treatment: 7 Clustered Wound: No Photos Wound Measurements Length: (cm) 15 Width: (cm) 25 Depth: (cm) 0.1 Area: (cm) 294.524 Volume: (cm) 29.452 % Reduction in Area: 71.4% % Reduction in Volume: 71.4% Tunneling: No Undermining: No Wound Description Classification: Partial Thickness Exudate Amount: Large Exudate Type: Serous Exudate Color: amber Wound Bed Granulation Amount: Large (67-100%) Exposed Structure Granulation Quality: Red Fascia Exposed: No Necrotic Amount: Small (1-33%) Fat Layer (Subcutaneous Tissue) Exposed: Yes Necrotic Quality: Adherent Slough Tendon Exposed: No Muscle Exposed: No Joint Exposed: No Bone Exposed: No Treatment Notes Wound #1 (Left, Circumferential Lower Leg) Schroyer, Tekia  L. ([id number]) Notes silvercel, xtrasorb, 4 layer wrap bilateral Electronic Signature(s) Signed: 10/15/2019 12:53:43 PM By: Army Melia Entered By: Army Melia on 10/15/2019 10:42:42 Nau, [place] ([id number]) -------------------------------------------------------------------------------- Wound Assessment Details Patient Name: Martha Downs L. Date of Service: 10/15/2019 10:15 AM Medical Record Number: [id number] Patient Account Number: [id number] Date of Birth/Sex: 11/08/1955 (64 y.o. F) Treating RN: Army Melia Primary Care Yarnell Kozloski: Neta Ehlers Other Clinician: Referring Jaunice Mirza: Neta Ehlers Treating Pervis Macintyre/Extender: Melburn Hake, HOYT Weeks in Treatment: 7 Wound Status Wound Number: 2 Primary Lymphedema Etiology: Wound Location: Right Lower Leg - Circumferential Wound Status: Open Wounding Event: Gradually Appeared Comorbid Anemia, Coronary Artery Disease, Date Acquired: 08/04/2019 History: Hypertension Weeks Of Treatment: 7 Clustered Wound: No Photos Wound Measurements Length: (cm) 7.5 Width: (cm) 9 Depth: (cm) 0.1 Area: (cm) 53.014 Volume: (cm) 5.301 % Reduction in Area: 81.3% % Reduction in Volume: 81.3% Wound Description Classification: Partial Thickness Foul Odo Exudate Amount: Medium Slough/F Exudate Type: Serous Exudate Color: amber r After Cleansing: No ibrino Yes Wound Bed Granulation Amount: Small (1-33%) Exposed Structure Granulation Quality: Red Fascia Exposed: No Necrotic Amount: Large (67-100%) Fat Layer (Subcutaneous Tissue) Exposed: Yes Necrotic Quality: Eschar Tendon Exposed: No Muscle Exposed: No Joint Exposed: No Bone Exposed: No Treatment Notes Wound #2 (Right, Circumferential Lower Leg) Moragne, Jakiya L. ([id number]) Notes silvercel, xtrasorb, 4 layer wrap bilateral Electronic Signature(s) Signed: 10/15/2019 12:53:43 PM By: Army Melia Entered By: Army Melia on 10/15/2019 10:43:18 Samples, [place]  ([id number]) -------------------------------------------------------------------------------- Vitals Details Patient Name: Martha Downs L. Date of Service: 10/15/2019 10:15 AM Medical Record Number: [id number] Patient Account Number: [id number] Date of Birth/Sex: 08-03-1956 (64 y.o. F) Treating RN: Montey Hora Primary Care Orlandria Kissner: Neta Ehlers Other Clinician: Referring  Tikita Mabee: Neta Ehlers Treating Thor Nannini/Extender: Melburn Hake, HOYT Weeks in Treatment: 7 Vital Signs Time Taken: 10:30 Temperature (F): 97.8 Height (in): 62 Pulse (bpm): 90 Weight (lbs): 290 Respiratory Rate (breaths/min): 18 Body Mass Index (BMI): 53 Blood Pressure (mmHg): 156/71 Reference Range: 80 - 120 mg / dl Electronic Signature(s) Signed: 10/15/2019 2:17:28 PM By: Lorine Bears RCP, RRT, CHT Entered By: Lorine Bears on 10/15/2019 10:32:20

## 2019-10-19 DIAGNOSIS — D500 Iron deficiency anemia secondary to blood loss (chronic): Secondary | ICD-10-CM

## 2019-10-19 HISTORY — DX: Iron deficiency anemia secondary to blood loss (chronic): D50.0

## 2019-10-20 ENCOUNTER — Ambulatory Visit (INDEPENDENT_AMBULATORY_CARE_PROVIDER_SITE_OTHER): Payer: Medicare Other | Admitting: Pharmacist

## 2019-10-20 DIAGNOSIS — Z7901 Long term (current) use of anticoagulants: Secondary | ICD-10-CM

## 2019-10-20 DIAGNOSIS — G459 Transient cerebral ischemic attack, unspecified: Secondary | ICD-10-CM | POA: Diagnosis not present

## 2019-10-20 LAB — POCT INR: INR: 3.1 — AB (ref 2.0–3.0)

## 2019-10-21 ENCOUNTER — Encounter: Payer: Medicare Other | Admitting: Physician Assistant

## 2019-10-21 ENCOUNTER — Other Ambulatory Visit: Payer: Self-pay

## 2019-10-21 DIAGNOSIS — L97822 Non-pressure chronic ulcer of other part of left lower leg with fat layer exposed: Secondary | ICD-10-CM | POA: Diagnosis not present

## 2019-10-21 NOTE — Progress Notes (Addendum)
ANASTAISA, GEFFRARD ([id number]) Visit Report for 10/21/2019 Chief Complaint Document Details Patient Name: [name], [name]. Date of Service: 10/21/2019 2:00 PM Medical Record Number: [id number] Patient Account Number: [id number] Date of Birth/Sex: [birth year] (64 y.o. Female) Treating RN: Army Melia Primary Care Provider: Neta Ehlers Other Clinician: Referring Provider: Neta Ehlers Treating Provider/Extender: Melburn Hake, HOYT Weeks in Treatment: 7 Information Obtained from: Patient Chief Complaint Bilateral LE Lymphedema and ulcers Electronic Signature(s) Signed: 10/21/2019 2:21:50 PM By: Worthy Keeler PA-C Entered By: Worthy Keeler on 10/21/2019 14:21:49 [place], [name] ([id number]) -------------------------------------------------------------------------------- HPI Details Patient Name: [name] L. Date of Service: 10/21/2019 2:00 PM Medical Record Number: [id number] Patient Account Number: [id number] Date of Birth/Sex: 1956-03-25 (64 y.o. Female) Treating RN: Army Melia Primary Care Provider: Neta Ehlers Other Clinician: Referring Provider: Neta Ehlers Treating Provider/Extender: Melburn Hake, HOYT Weeks in Treatment: 7 History of Present Illness HPI Description: 08/27/2019 patient presents today for initial evaluation here in our clinic concerning issues that she has been having with her bilateral lower extremities with lymphedema and weeping. She is previously been a patient over the past year of Dr. Jerilee Hoh at the wound care center in [facility]. With that being said she subsequently was recommended by the lymphedema clinic here in [place] to follow-up with [place] here instead of going back to [place] since they were in closer contact with the clinic here than they are obviously. With that being said the patient states that is why she came her way in order to be evaluated at this point. Fortunately there is no signs of active infection at this  time. No fevers, chills, nausea, vomiting, or diarrhea. The patient does have a history of lymphedema, hypertension, long-term use of anticoagulant therapy, and obstructive sleep apnea. During the visit today and from checking into the discharge she was prone to unexpectedly falling asleep. I did discuss this with her daughter as well who states that this is very common for her she states she is not really sure how compliant her mother is with her CPAP machine that may be part of the issue. The patient did have ABIs performed which revealed have copies of his well today that was on 05/01/2019 and it showed a right ABI of 1.17 with a TBI of 1.12 and a left ABI of 1.24 with a TBI of 0.96. She has had 2 transfusions in the past 2 weeks apparently as well. 08/31/2019 upon evaluation today patient actually appears to be doing somewhat better in regard to her lower extremity ulcers. She still has a tremendous amount of drainage from the left lower extremity unfortunately but her right lower extremity is doing much better which is great news. Overall I am very pleased in this regard. With regard to home health we finally got things settled so home health will be coming out to see her this would be very beneficial in my opinion as I think they will be able to help her tremendously with more frequent dressing changes. 12/28-Patient is back here at 1 week visit, with regard to her bilateral lower extremity ulcers, she has a significant degree of lymphedema, significant degree of drainage more from the left and which continues to be an issue, patient had not been using her lymphedema pumps as recommended, she is concerned about amount of drainage and how quickly it soaks the dressing, she is concerned about the lymphedema pump sticking to her skin and was told to use it on the wraps.  She will get in touch with her cardiologist about adjusting her Lasix dose. 09/14/2019 on evaluation today patient appears to be  doing poorly in regard to her lower extremities especially left lower extremity. Unfortunately she is continue to have a significant amount of drainage which is preventing her from being able to heal keeping the wounds very macerated. She also has some warmth to touch in the anterior to medial portion of the left lower extremity compared to the remainder of the wound which does have made somewhat concerned about the possibility of cellulitis. Obviously it can be difficult to determine just strictly lymphedema versus cellulitis but right now I am somewhat concerned. She tells me that she can put a wrap on and at least has to change this twice a day or else she will be walking around leaving puddles of water behind her. Obviously this is fairly significant. She did see her primary care provider they did not see anything going on that they felt needed to be addressed from a diuretic standpoint but nonetheless I am still somewhat concerned based on what we are seeing to be honest. The patient and her husband are likewise concerned. 09/17/2019 patient is seen today in follow-up I last saw her on Tuesday at that point based on the severity of her lower extremity edema, weeping, and erythema I did send her to the ER for further evaluation and treatment. She subsequently did go for that evaluation. It did appear that based on my review of the notes this is dated 09/14/2019 that the patient did have a BNP of 149.4. Which was notable. Subsequently she was given a dose of IV vancomycin as well as doxycycline and Keflex. It appears however that she was discharged on oral clindamycin 150 mg every 6 hours and to be honest compared to what I saw on Tuesday she actually seems to be doing somewhat better. Fortunately there is no evidence of active infection at this time which is good news. In the interim she did yesterday see her cardiologist as well Dr. Bettina Gavia. That was on 09/16/2019. Upon review of this note it does  appear that his recommendation at this point was to increase the furosemide to 40 mg 2 times a day. He also recommend an increase of potassium 10 mg taken 2 times a day. His other recommendation was that the patient needs to have a follow-up with her hematology oncologist in order to see if they can do anything about iron transfusions for her as unfortunately her iron is still quite low and obviously oral supplementation is not going to be the best Dudziak, Avella ([id number]) way to get this up. 09/24/2019 upon evaluation today patient appears to be doing quite well with regard to her lower extremities. The right lower extremity in particular has dramatically improved to be honest so has the left in my opinion. There does not appear to be any signs of active infection which is good news. No fevers, chills, nausea, vomiting, or diarrhea. Patient is still taking the clindamycin at this time. 10/01/2019 on evaluation today patient appears to be showing some signs of improvement at this point in regard to her lower extremities. The right lower extremity is doing quite well the left lower extremity is still draining but again I do believe this is significantly better compared to what it was in the past. Fortunately there is no evidence of active infection at this time. No fever chills noted 10/08/2019 upon evaluation today patient appears to have smaller  areas of erythema and weeping on the left lower extremity as compared to last week's evaluation. The right leg is doing excellent. With that being said unfortunately she is still having a lot of significant swelling on the left in fact the swelling is worse today than it was last week. With that being said she is telling me that she is able to keep her wraps on now for at least the day sometimes even up to 30 hours before they are having to be changed. When they do have to change it they are using her lymphedema wraps at home to reapply what they can. She  only changes that she tells me when it actually gets to the point that is draining so much that it leaks onto her floor. 10/15/2019 upon evaluation today patient appears to actually be doing very well with regard to her lower extremities in fact I believe the right lower extremity may be completely sealed up that we will likely watch it 1 more week. The left lower extremity is doing better she does not seem to be having as much weeping and leaking but still this is going on. Home health still has not gotten the [place] there is still just using ABD pads I believe was able to get Lauraine Rinne this would actually help out. 10/21/2019 upon evaluation today patient appears to be doing much better in regard to her lower extremities bilaterally. The right lower extremity has 1 area remaining that still draining the left lower extremity is still draining but not nearly what it was in the past. Overall I am extremely pleased with how things seem to be progressing. Electronic Signature(s) Signed: 10/21/2019 5:28:35 PM By: Worthy Keeler PA-C Previous Signature: 10/21/2019 3:04:32 PM Version By: Worthy Keeler PA-C Entered By: Worthy Keeler on 10/21/2019 17:28:35 [name], [name] ([id number]) -------------------------------------------------------------------------------- Physical Exam Details Patient Name: RUTHMARIE, ASTURIAS L. Date of Service: 10/21/2019 2:00 PM Medical Record Number: [id number] Patient Account Number: [id number] Date of Birth/Sex: [birth year] (64 y.o. Female) Treating RN: Army Melia Primary Care Provider: Neta Ehlers Other Clinician: Referring Provider: Neta Ehlers Treating Provider/Extender: Melburn Hake, HOYT Weeks in Treatment: 7 Constitutional Obese and well-hydrated in no acute distress. Respiratory normal breathing without difficulty. Psychiatric this patient is able to make decisions and demonstrates good insight into disease process. Alert and Oriented x 3. pleasant and  cooperative. Notes Patient's wound bed currently did show signs in general of improvement overall and I feel like she is managing quite nicely the edema and the weeping on the left lower extremity is significantly improved even compared to last week I feel like we are on the right track here. I do want to continue with how things are doing at this point. Electronic Signature(s) Signed: 10/21/2019 3:04:53 PM By: Worthy Keeler PA-C Entered By: Worthy Keeler on 10/21/2019 15:04:52 [name], [name] ([id number]) -------------------------------------------------------------------------------- Physician Orders Details Patient Name: [name]. Date of Service: 10/21/2019 2:00 PM Medical Record Number: [id number] Patient Account Number: [id number] Date of Birth/Sex: [birth year] (64 y.o. 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 - Do not get your wraps wet Wound #2 Right,Circumferential Lower Leg o Cleanse wound with mild soap and water o May shower with protection. - Do not get your wraps wet Skin Barriers/Peri-Wound Care o Triamcinolone Acetonide Ointment (TCA) - to left posterior lower leg Primary Wound Dressing Wound #1 Left,Circumferential Lower Leg o Silver Alginate Wound #2 Right,Circumferential Lower Leg o  Silver Alginate Secondary Dressing Wound #1 Left,Circumferential Lower Leg o XtraSorb Wound #2 Right,Circumferential Lower Leg o XtraSorb Dressing Change Frequency Wound #1 Left,Circumferential Lower Leg o Change Dressing Monday, Wednesday, Friday - Friday in office Wound #2 Right,Circumferential Lower Leg o Change Dressing Monday, Wednesday, Friday - Friday in office Follow-up Appointments [name], [name] L. ([id number]) Wound #1 Left,Circumferential Lower Leg o Return Appointment in 1 week. Wound #2 Right,Circumferential Lower Leg o Return Appointment in 1 week. Edema Control Wound #1 Left,Circumferential Lower Leg o 4 Layer Compression System - Bilateral Wound #2 Right,Circumferential Lower Leg o 4 Layer Compression System - Bilateral Home Health Wound #1 Left,Circumferential Lower Leg o Continue Home Health Visits - [place] Nurse may visit PRN to address patientos wound care needs. o FACE TO FACE ENCOUNTER: MEDICARE and MEDICAID PATIENTS: I certify that this patient is under my care and that I had a face-to-face encounter that meets the physician face-to-face encounter requirements with this patient on this date. The encounter with the patient was in whole or in part for the following MEDICAL CONDITION: (primary reason for [place]) MEDICAL NECESSITY: I certify, that based on my findings, NURSING services are a medically necessary home health service. HOME BOUND STATUS: I certify that my clinical findings support that this patient is homebound (i.e., Due to illness or injury, pt requires aid of supportive devices such as crutches, cane, wheelchairs, walkers, the use of special transportation or the assistance of another person to leave their place of residence. There is a normal inability to leave the home and doing so requires considerable and taxing effort. Other absences are for medical reasons / religious services and are infrequent  or of short duration when for other reasons). o If current dressing causes regression in wound condition, may D/C ordered dressing product/s and apply Normal Saline Moist Dressing daily until next [place] / Other MD appointment. [place] of regression in wound condition at [phone]. o Please direct any NON-WOUND related issues/requests for orders to patient's Primary Care Physician Wound #2 Right,Circumferential Lower Leg o [name] Visits - [place] Nurse may visit PRN to address patientos wound care needs. o FACE TO FACE ENCOUNTER: MEDICARE and MEDICAID PATIENTS: I certify that this patient is under my care and that I had a face-to-face encounter that meets the physician face-to-face encounter requirements with this patient on this date. The encounter with the patient was in whole or in part for the following MEDICAL CONDITION: (primary reason for [place]) MEDICAL NECESSITY: I certify, that based on my findings, NURSING services are a medically necessary home health service. HOME BOUND STATUS: I certify that my clinical findings support that this patient is homebound (i.e., Due to illness or injury, pt requires aid of supportive devices such as crutches, cane, wheelchairs, walkers, the use of special transportation or the assistance of another person to leave their place of residence. There is a normal  inability to leave the home and doing so requires considerable and taxing effort. Other absences are for medical reasons / religious services and are infrequent or of short duration when for other reasons). o If current dressing causes regression in wound condition, may D/C ordered dressing product/s and apply Normal Saline Moist Dressing daily until next [place] / Other MD appointment. [place] of regression in wound condition at [phone]. o Please direct any NON-WOUND related  issues/requests for orders to patient's Primary Care Physician Patient Medications Allergies: vancomycin Notifications Medication Indication Start End triamcinolone acetonide 10/21/2019 [name], [name]. ([id number]) Notifications Medication Indication Start End DOSE 0 - topical 0.1 % cream - cream topical applied in a thin film over the leg with each wrap change not to be placed in the wounds Electronic Signature(s) Signed: 10/21/2019 3:07:14 PM By: Worthy Keeler PA-C Entered By: Worthy Keeler on 10/21/2019 15:07:13 [name], [name] ([id number]) -------------------------------------------------------------------------------- Problem List Details Patient Name: [name] L. Date of Service: 10/21/2019 2:00 PM Medical Record Number: [id number] Patient Account Number: [id number] Date of Birth/Sex: June 09, 1956 (64 y.o. Female) Treating RN: Army Melia Primary Care Provider: Neta Ehlers Other Clinician: Referring Provider: Neta Ehlers Treating Provider/Extender: Melburn Hake, HOYT Weeks in Treatment: 7 Active Problems ICD-10 Evaluated Encounter Code Description Active Date Today Diagnosis I89.0 Lymphedema, not elsewhere classified 08/27/2019 No Yes L97.812 Non-pressure chronic ulcer of other part of right lower leg 08/27/2019 No Yes with fat layer exposed L97.822 Non-pressure chronic ulcer of other part of left lower leg with 08/27/2019 No Yes fat layer exposed [place] (primary) hypertension 08/27/2019 No Yes Z79.01 Long term (current) use of anticoagulants 08/27/2019 No Yes Inactive Problems Resolved Problems Electronic Signature(s) Signed: 10/21/2019 2:21:44 PM By: Worthy Keeler PA-C Entered By: Worthy Keeler on 10/21/2019 14:21:43 [name], [name] ([id number]) -------------------------------------------------------------------------------- Progress Note Details Patient Name: [name] L. Date of Service: 10/21/2019 2:00 PM Medical Record Number:  [id number] Patient Account Number: [id number] Date of Birth/Sex: 05-Dec-1955 (64 y.o. Female) Treating RN: Army Melia Primary Care Provider: Neta Ehlers Other Clinician: Referring Provider: Neta Ehlers Treating Provider/Extender: Melburn Hake, HOYT Weeks in Treatment: 7 Subjective Chief Complaint Information obtained from Patient Bilateral LE Lymphedema and ulcers History of Present Illness (HPI) 08/27/2019 patient presents today for initial evaluation here in our clinic concerning issues that she has been having with her bilateral lower extremities with lymphedema and weeping. She is previously been a patient over the past year of Dr. Jerilee Hoh at the wound care center in [name]. With that being said she subsequently was recommended by the lymphedema clinic here in [place] to follow-up with [place] here instead of going back to [place] since they were in closer contact with the clinic here than they are obviously. With that being said the patient states that is why she came her way in order to be evaluated at this point. Fortunately there is no signs of active infection at this time. No fevers, chills, nausea, vomiting, or diarrhea. The patient does have a history of lymphedema, hypertension, long-term use of anticoagulant therapy, and obstructive sleep apnea. During the visit today and from checking into the discharge she was prone to unexpectedly falling asleep. I did discuss this with her daughter as well who states that this is very common for her she states she is not really sure how compliant her mother is with her CPAP machine that may be part of the issue. The patient did have  ABIs performed which revealed have copies of his well today that was on 05/01/2019 and it showed a right ABI of 1.17 with a TBI of 1.12 and a left ABI of 1.24 with a TBI of 0.96. She has had 2 transfusions in the past 2 weeks apparently as well. 08/31/2019 upon evaluation today patient  actually appears to be doing somewhat better in regard to her lower extremity ulcers. She still has a tremendous amount of drainage from the left lower extremity unfortunately but her right lower extremity is doing much better which is great news. Overall I am very pleased in this regard. With regard to home health we finally got things settled so home health will be coming out to see her this would be very beneficial in my opinion as I think they will be able to help her tremendously with more frequent dressing changes. 12/28-Patient is back here at 1 week visit, with regard to her bilateral lower extremity ulcers, she has a significant degree of lymphedema, significant degree of drainage more from the left and which continues to be an issue, patient had not been using her lymphedema pumps as recommended, she is concerned about amount of drainage and how quickly it soaks the dressing, she is concerned about the lymphedema pump sticking to her skin and was told to use it on the wraps. She will get in touch with her cardiologist about adjusting her Lasix dose. 09/14/2019 on evaluation today patient appears to be doing poorly in regard to her lower extremities especially left lower extremity. Unfortunately she is continue to have a significant amount of drainage which is preventing her from being able to heal keeping the wounds very macerated. She also has some warmth to touch in the anterior to medial portion of the left lower extremity compared to the remainder of the wound which does have made somewhat concerned about the possibility of cellulitis. Obviously it can be difficult to determine just strictly lymphedema versus cellulitis but right now I am somewhat concerned. She tells me that she can put a wrap on and at least has to change this twice a day or else she will be walking around leaving puddles of water behind her. Obviously this is fairly significant. She did see her primary care provider  they did not see anything going on that they felt needed to be addressed from a diuretic standpoint but nonetheless I am still somewhat concerned based on what we are seeing to be honest. The patient and her husband are likewise concerned. 09/17/2019 patient is seen today in follow-up I last saw her on Tuesday at that point based on the severity of her lower extremity edema, weeping, and erythema I did send her to the ER for further evaluation and treatment. She subsequently did go for that evaluation. It did appear that based on my review of the notes this is dated 09/14/2019 that the patient did have a BNP of 149.4. Which was notable. Subsequently she was given a dose of IV vancomycin as well as doxycycline and Keflex. It appears however that she was discharged on oral clindamycin 150 mg every 6 hours and to be honest compared to what I saw on Tuesday she actually seems to be doing somewhat better. Fortunately there is no evidence of active infection at this time [name], [name]. ([id number]) which is good news. In the interim she did yesterday see her cardiologist as well Dr. Bettina Gavia. That was on 09/16/2019. Upon review of this note it does appear  that his recommendation at this point was to increase the furosemide to 40 mg 2 times a day. He also recommend an increase of potassium 10 mg taken 2 times a day. His other recommendation was that the patient needs to have a follow-up with her hematology oncologist in order to see if they can do anything about iron transfusions for her as unfortunately her iron is still quite low and obviously oral supplementation is not going to be the best way to get this up. 09/24/2019 upon evaluation today patient appears to be doing quite well with regard to her lower extremities. The right lower extremity in particular has dramatically improved to be honest so has the left in my opinion. There does not appear to be any signs of active infection which is good news. No  fevers, chills, nausea, vomiting, or diarrhea. Patient is still taking the clindamycin at this time. 10/01/2019 on evaluation today patient appears to be showing some signs of improvement at this point in regard to her lower extremities. The right lower extremity is doing quite well the left lower extremity is still draining but again I do believe this is significantly better compared to what it was in the past. Fortunately there is no evidence of active infection at this time. No fever chills noted 10/08/2019 upon evaluation today patient appears to have smaller areas of erythema and weeping on the left lower extremity as compared to last week's evaluation. The right leg is doing excellent. With that being said unfortunately she is still having a lot of significant swelling on the left in fact the swelling is worse today than it was last week. With that being said she is telling me that she is able to keep her wraps on now for at least the day sometimes even up to 30 hours before they are having to be changed. When they do have to change it they are using her lymphedema wraps at home to reapply what they can. She only changes that she tells me when it actually gets to the point that is draining so much that it leaks onto her floor. 10/15/2019 upon evaluation today patient appears to actually be doing very well with regard to her lower extremities in fact I believe the right lower extremity may be completely sealed up that we will likely watch it 1 more week. The left lower extremity is doing better she does not seem to be having as much weeping and leaking but still this is going on. Home health still has not gotten the Trail there is still just using ABD pads I believe was able to get Lauraine Rinne this would actually help out. 10/21/2019 on evaluation today patient appears to be doing well with regard to her lower extremities. She is having much less weeping and overall I feel like things are  dramatically improved. Fortunately there is no signs of active infection at this time. No fevers, chills, nausea, vomiting, or diarrhea. Objective Constitutional Obese and well-hydrated in no acute distress. Vitals Time Taken: 2:15 PM, Height: 62 in, Weight: 290 lbs, BMI: 53, Temperature: 98.2 F, Pulse: 90 bpm, Respiratory Rate: 18 breaths/min, Blood Pressure: 149/60 mmHg. Respiratory normal breathing without difficulty. Psychiatric this patient is able to make decisions and demonstrates good insight into disease process. Alert and Oriented x 3. pleasant and cooperative. General Notes: Patient's wound bed currently did show signs in general of improvement overall and I feel like she is [name], [name] ([id number]) managing quite nicely the edema and  the weeping on the left lower extremity is significantly improved even compared to last week I feel like we are on the right track here. I do want to continue with how things are doing at this point. Integumentary (Hair, Skin) Wound #1 status is Open. Original cause of wound was Gradually Appeared. The wound is located on the Left,Circumferential Lower Leg. The wound measures 15cm length x 22cm width x 0.1cm depth; 259.181cm^2 area and 25.918cm^3 volume. There is Fat Layer (Subcutaneous Tissue) Exposed exposed. There is no tunneling or undermining noted. There is a large amount of serous drainage noted. The wound margin is indistinct and nonvisible. There is large (67- 100%) red granulation within the wound bed. There is a small (1-33%) amount of necrotic tissue within the wound bed including Adherent Slough. Wound #2 status is Open. Original cause of wound was Gradually Appeared. The wound is located on the Right,Circumferential Lower Leg. The wound measures 1.5cm length x 1cm width x 0.1cm depth; 1.178cm^2 area and 0.118cm^3 volume. There is Fat Layer (Subcutaneous Tissue) Exposed exposed. There is no tunneling or undermining noted. There is  a medium amount of serous drainage noted. There is no granulation within the wound bed. There is a large (67-100%) amount of necrotic tissue within the wound bed including Eschar. Assessment Active Problems ICD-10 Lymphedema, not elsewhere classified Non-pressure chronic ulcer of other part of right lower leg with fat layer exposed Non-pressure chronic ulcer of other part of left lower leg with fat layer exposed Essential (primary) hypertension Long term (current) use of anticoagulants Procedures Wound #1 Pre-procedure diagnosis of Wound #1 is a Lymphedema located on the Left,Circumferential Lower Leg . There was a Four Layer Compression Therapy Procedure by Army Melia, RN. Post procedure Diagnosis Wound #1: Same as Pre-Procedure Wound #2 Pre-procedure diagnosis of Wound #2 is a Lymphedema located on the Right,Circumferential Lower Leg . There was a Four Layer Compression Therapy Procedure by Army Melia, RN. Post procedure Diagnosis Wound #2: Same as Pre-Procedure Plan Wound Cleansing: Wound #1 Left,Circumferential Lower Leg: [name], [name] Cleanse wound with mild soap and water May shower with protection. - Do not get your wraps wet Wound #2 Right,Circumferential Lower Leg: Cleanse wound with mild soap and water May shower with protection. - Do not get your wraps wet Skin Barriers/Peri-Wound Care: Triamcinolone Acetonide Ointment (TCA) - to left posterior lower leg Primary Wound Dressing: Wound #1 Left,Circumferential Lower Leg: Silver Alginate Wound #2 Right,Circumferential Lower Leg: Silver Alginate Secondary Dressing: Wound #1 Left,Circumferential Lower Leg: XtraSorb Wound #2 Right,Circumferential Lower Leg: XtraSorb Dressing Change Frequency: Wound #1 Left,Circumferential Lower Leg: Change Dressing Monday, Wednesday, Friday - Friday in office Wound #2 Right,Circumferential Lower Leg: Change Dressing Monday, Wednesday, Friday - Friday in  office Follow-up Appointments: Wound #1 Left,Circumferential Lower Leg: Return Appointment in 1 week. Wound #2 Right,Circumferential Lower Leg: Return Appointment in 1 week. Edema Control: Wound #1 Left,Circumferential Lower Leg: 4 Layer Compression System - Bilateral Wound #2 Right,Circumferential Lower Leg: 4 Layer Compression System - Bilateral Home Health: Wound #1 Left,Circumferential Lower Leg: Continue Home Health Visits - [place] Nurse may visit PRN to address patient s wound care needs. FACE TO FACE ENCOUNTER: MEDICARE and MEDICAID PATIENTS: I certify that this patient is under my care and that I had a face-to-face encounter that meets the physician face-to-face encounter requirements with this patient on this date. The encounter with the patient was in whole or in part for the following MEDICAL CONDITION: (primary reason for [place])  MEDICAL NECESSITY: I certify, that based on my findings, NURSING services are a medically necessary home health service. HOME BOUND STATUS: I certify that my clinical findings support that this patient is homebound (i.e., Due to illness or injury, pt requires aid of supportive devices such as crutches, cane, wheelchairs, walkers, the use of special transportation or the assistance of another person to leave their place of residence. There is a normal inability to leave the home and doing so requires considerable and taxing effort. Other absences are for medical reasons / religious services and are infrequent or of short duration when for other reasons). If current dressing causes regression in wound condition, may D/C ordered dressing product/s and apply Normal Saline Moist Dressing daily until next [place] / Other MD appointment. [place] of regression in wound condition at [phone]. Please direct any NON-WOUND related issues/requests for orders to patient's Primary Care Physician Wound #2  Right,Circumferential Lower Leg: [place] Visits - [place] Nurse may visit PRN to address patient s wound care needs. FACE TO FACE ENCOUNTER: MEDICARE and MEDICAID PATIENTS: I certify that this patient is under my care and that I had a face-to-face encounter that meets the physician face-to-face encounter requirements with this patient on this date. The encounter with the patient was in whole or in part for the following MEDICAL CONDITION: (primary reason for [place]) MEDICAL NECESSITY: I certify, that based on my findings, NURSING services are a medically necessary home health service. HOME BOUND STATUS: I certify that my clinical findings support that this patient is homebound (i.e., Due to illness or injury, pt requires aid of supportive devices such as crutches, cane, wheelchairs, walkers, the use of special transportation or the assistance of another person to leave their place of residence. There is a normal inability to leave the home and doing so requires considerable and taxing effort. Other absences are for medical reasons / religious services and [place], [name] ([id number]) are infrequent or of short duration when for other reasons). If current dressing causes regression in wound condition, may D/C ordered dressing product/s and apply Normal Saline Moist Dressing daily until next [place] / Other MD appointment. [place] of regression in wound condition at [phone]. Please direct any NON-WOUND related issues/requests for orders to patient's Primary Care Physician The following medication(s) was prescribed: triamcinolone acetonide topical 0.1 % cream 0 cream topical applied in a thin film over the leg with each wrap change not to be placed in the wounds starting 10/21/2019 1. My suggestion at this time is no need to continue with the wound care measures which includes the 4 layer compression wrap bilaterally which I  think is keeping her edema under good control. I do want this to really be changed on Monday, Wednesday, and Friday as I feel like the more exchanged to the better and again were keeping this edema under better control allowing this to dry up that much more quickly. 2. I am also going to suggest that we continue with XtraSorb and silver alginate over any open weeping areas. 3. I am also going to suggest at this point that she continue to elevate her legs as much as possible. 4. She does state that the triamcinolone seems to have been beneficial for her. For that reason I am going to go ahead and send in a prescription for the triamcinolone to be used by home health as well this needs to be in a  thin film however not placed thickly over the legs. Hopefully that should continue to help with the itching. We will see patient back for reevaluation in 1 week here in the clinic. If anything worsens or changes patient will contact our office for additional recommendations. Electronic Signature(s) Signed: 10/21/2019 3:07:48 PM By: Worthy Keeler PA-C Entered By: Worthy Keeler on 10/21/2019 15:07:47 [name], [name] ([id number]) -------------------------------------------------------------------------------- SuperBill Details Patient Name: [name]. Date of Service: 10/21/2019 Medical Record Number: [id number] Patient Account Number: [id number] Date of Birth/Sex: 04/09/1956 (64 y.o. Female) Treating RN: Army Melia Primary Care Provider: Neta Ehlers Other Clinician: Referring Provider: Neta Ehlers Treating Provider/Extender: Melburn Hake, HOYT Weeks in Treatment: 7 Diagnosis Coding ICD-10 Codes Code Description I89.0 Lymphedema, not elsewhere classified L97.812 Non-pressure chronic ulcer of other part of right lower leg with fat layer exposed L97.822 Non-pressure chronic ulcer of other part of left lower leg with fat layer exposed [name] (primary) hypertension Z79.01 Long  term (current) use of anticoagulants Facility Procedures CPT4: Description Modifier Quantity Code [id number] BILATERAL: Application of multi-layer venous compression system; leg (below 1 knee), including ankle and foot. Physician Procedures CPT4 Code Description: [id number] - WC PHYS LEVEL 4 - EST PT ICD-10 Diagnosis Description I89.0 Lymphedema, not elsewhere classified [id number] Non-pressure chronic ulcer of other part of right lower leg wi L97.822 Non-pressure chronic ulcer of other  part of left lower leg wit I10 Essential (primary) hypertension Modifier: th fat layer expos h fat layer expose Quantity: 1 ed d Electronic Signature(s) Signed: 10/21/2019 3:08:01 PM By: Worthy Keeler PA-C Entered By: Worthy Keeler on 10/21/2019 15:08:00

## 2019-10-21 NOTE — Progress Notes (Signed)
[name] ([id number]) Visit Report for 10/21/2019 Arrival Information Details Patient Name: [name], [name]. Date of Service: 10/21/2019 2:00 PM Medical Record Number: [id number] Patient Account Number: [id number] Date of Birth/Sex: 1955-11-29 (64 y.o. Female) Treating RN: Army Melia Primary Care Ambika Zettlemoyer: Neta Ehlers Other Clinician: Referring Willoughby Doell: Neta Ehlers Treating Sindhu Nguyen/Extender: Melburn Hake, HOYT Weeks in Treatment: 7 Visit Information History Since Last Visit Added or deleted any medications: No Patient Arrived: Ambulatory Any new allergies or adverse reactions: No Arrival Time: 14:15 Had a fall or experienced change in No Accompanied By: self activities of daily living that may affect Transfer Assistance: None risk of falls: Patient Identification Verified: Yes Signs or symptoms of abuse/neglect since last visito No Secondary Verification Process Yes Hospitalized since last visit: No Completed: Implantable device outside of the clinic excluding No Patient Has Alerts: Yes cellular tissue based products placed in the center Patient Alerts: Patient on Blood since last visit: Thinner Has Dressing in Place as Prescribed: Yes warfarin Has Compression in Place as Prescribed: Yes Not Diabetic 04/29/2019 [facility] Pain Present Now: No ABI R)1.17 L)1.24 TBI R) 1.12 L) 0.96 Electronic Signature(s) Signed: 10/21/2019 4:36:47 PM By: Lorine Bears RCP, RRT, CHT Entered By: Lorine Bears on 10/21/2019 14:18:44 Gudgel, [place] ([id number]) -------------------------------------------------------------------------------- Compression Therapy Details Patient Name: Martha Downs L. Date of Service: 10/21/2019 2:00 PM Medical Record Number: [id number] Patient Account Number: [id number] Date of Birth/Sex: Feb 18, 1956 (64 y.o. Female) Treating RN: Army Melia Primary Care Orit Sanville: Neta Ehlers Other Clinician: Referring Mallie Linnemann:  Neta Ehlers Treating Freddi Schrager/Extender: Melburn Hake, HOYT Weeks in Treatment: 7 Compression Therapy Performed for Wound Assessment: Wound #1 Left,Circumferential Lower Leg Performed By: Clinician Army Melia, RN Compression Type: Four Layer Post Procedure Diagnosis Same as Pre-procedure Electronic Signature(s) Signed: 10/21/2019 4:09:28 PM By: Army Melia Entered By: Army Melia on 10/21/2019 15:02:37 [name] ([id number]) -------------------------------------------------------------------------------- Compression Therapy Details Patient Name: Martha Downs L. Date of Service: 10/21/2019 2:00 PM Medical Record Number: [id number] Patient Account Number: [id number] Date of Birth/Sex: 12-30-1955 (64 y.o. Female) Treating RN: Army Melia Primary Care Renesmae Donahey: Neta Ehlers Other Clinician: Referring Nivedita Mirabella: Neta Ehlers Treating Joshua Soulier/Extender: Melburn Hake, HOYT Weeks in Treatment: 7 Compression Therapy Performed for Wound Assessment: Wound #2 Right,Circumferential Lower Leg Performed By: Clinician Army Melia, RN Compression Type: Four Layer Post Procedure Diagnosis Same as Pre-procedure Electronic Signature(s) Signed: 10/21/2019 4:09:28 PM By: Army Melia Entered By: Army Melia on 10/21/2019 15:02:37 [name] ([id number]) -------------------------------------------------------------------------------- Encounter Discharge Information Details Patient Name: Martha Downs L. Date of Service: 10/21/2019 2:00 PM Medical Record Number: [id number] Patient Account Number: [id number] Date of Birth/Sex: [birth year] (64 y.o. Female) Treating RN: Army Melia Primary Care Ziyana Morikawa: Neta Ehlers Other Clinician: Referring Liz Pinho: Neta Ehlers Treating Jadda Hunsucker/Extender: Sharalyn Ink in Treatment: 7 Encounter Discharge Information Items Discharge Condition: Stable Ambulatory Status: Ambulatory Discharge Destination: Home Transportation:  Private Auto Accompanied By: self Schedule Follow-up Appointment: Yes Clinical Summary of Care: Electronic Signature(s) Signed: 10/21/2019 4:09:28 PM By: Army Melia Entered By: Army Melia on 10/21/2019 15:05:04 [name], [name] -------------------------------------------------------------------------------- Lower Extremity Assessment Details Patient Name: Martha Downs L. Date of Service: 10/21/2019 2:00 PM Medical Record Number: [id number] Patient Account Number: [id number] Date of Birth/Sex: 1955-12-06 (64 y.o. Female) Treating RN: Montey Hora Primary Care Gavriela Cashin: Neta Ehlers Other Clinician: Referring Donja Tipping: Neta Ehlers Treating Gerene Nedd/Extender: STONE III, HOYT Weeks in Treatment: 7 Edema Assessment Assessed: [Left: No] [Right: No] Edema: [Left: Yes] [Right:  Yes] Calf Left: Right: Point of Measurement: 32 cm From Medial Instep 59 cm 54 cm Ankle Left: Right: Point of Measurement: 10 cm From Medial Instep 29.5 cm 31 cm Vascular Assessment Pulses: Dorsalis Pedis Palpable: [Left:Yes] [Right:Yes] Electronic Signature(s) Signed: 10/21/2019 4:40:05 PM By: Montey Hora Entered By: Montey Hora on 10/21/2019 14:27:02 [name], [name] -------------------------------------------------------------------------------- Multi Wound Chart Details Patient Name: Martha Downs L. Date of Service: 10/21/2019 2:00 PM Medical Record Number: [id number] Patient Account Number: [id number] Date of Birth/Sex: [birth year] (64 y.o. Female) Treating RN: Army Melia Primary Care Korinne Greenstein: Neta Ehlers Other Clinician: Referring Dynesha Woolen: Neta Ehlers Treating Kieran Nachtigal/Extender: Melburn Hake, HOYT Weeks in Treatment: 7 Vital Signs Height(in): 62 Pulse(bpm): 90 Weight(lbs): 290 Blood Pressure(mmHg): 149/60 Body Mass Index(BMI): 53 Temperature(F): 98.2 Respiratory Rate 18 (breaths/min): Photos: [N/A:N/A] Wound Location: Left Lower Leg - Right  Lower Leg - N/A Circumferential Circumferential Wounding Event: Gradually Appeared Gradually Appeared N/A Primary Etiology: Lymphedema Lymphedema N/A Comorbid History: Anemia, Coronary Artery Anemia, Coronary Artery N/A Disease, Hypertension Disease, Hypertension Date Acquired: 08/04/2019 08/04/2019 N/A Weeks of Treatment: 7 7 N/A Wound Status: Open Open N/A Measurements L x W x D 15x22x0.1 1.5x1x0.1 N/A (cm) Area (cm) : 259.181 1.178 N/A Volume (cm) : 25.918 0.118 N/A % Reduction in Area: 74.80% 99.60% N/A % Reduction in Volume: 74.80% 99.60% N/A Classification: Partial Thickness Partial Thickness N/A Exudate Amount: Large Medium N/A Exudate Type: Serous Serous N/A Exudate Color: amber amber N/A Wound Margin: Indistinct, nonvisible N/A N/A Boettcher, [name] Granulation Amount: Large (67-100%) None Present (0%) N/A Granulation Quality: Red N/A N/A Necrotic Amount: Small (1-33%) Large (67-100%) N/A Necrotic Tissue: Adherent Slough Eschar N/A Exposed Structures: Fat Layer (Subcutaneous Fat Layer (Subcutaneous N/A Tissue) Exposed: Yes Tissue) Exposed: Yes Fascia: No Fascia: No Tendon: No Tendon: No Muscle: No Muscle: No Joint: No Joint: No Bone: No Bone: No Epithelialization: Medium (34-66%) None N/A Treatment Notes Electronic Signature(s) Signed: 10/21/2019 4:09:28 PM By: Army Melia Entered By: Army Melia on 10/21/2019 14:55:58 Bradt, [name] ([id number]) -------------------------------------------------------------------------------- Ethete Details Patient Name: Martha Siren. Date of Service: 10/21/2019 2:00 PM Medical Record Number: [id number] Patient Account Number: [id number] Date of Birth/Sex: 20-Jul-1956 (64 y.o. Female) Treating RN: Army Melia Primary Care Jorey Dollard: Neta Ehlers Other Clinician: Referring Winfrey Chillemi: Neta Ehlers Treating Zebulun Deman/Extender: Melburn Hake, HOYT Weeks in Treatment: 7 Active  Inactive Abuse / Safety / Falls / Self Care Management Nursing Diagnoses: Potential for falls Goals: Patient will not experience any injury related to falls Date Initiated: 08/27/2019 Target Resolution Date: 11/20/2019 Goal Status: Active Interventions: Assess fall risk on admission and as needed Notes: Orientation to the Wound Care Program Nursing Diagnoses: Knowledge deficit related to the wound healing center program Goals: Patient/caregiver will verbalize understanding of the [place] Program Date Initiated: 08/27/2019 Target Resolution Date: 11/20/2019 Goal Status: Active Interventions: Provide education on orientation to the wound center Notes: Venous Leg Ulcer Nursing Diagnoses: Potential for venous Insuffiency (use before diagnosis confirmed) Goals: Patient will maintain optimal edema control Date Initiated: 08/27/2019 Target Resolution Date: 11/20/2019 Goal Status: Active Interventions: Assess peripheral edema status every visit. [place], [place] ([id number]) Notes: Wound/Skin Impairment Nursing Diagnoses: Impaired tissue integrity Goals: Ulcer/skin breakdown will heal within 14 weeks Date Initiated: 08/27/2019 Target Resolution Date: 11/20/2019 Goal Status: Active Interventions: Assess patient/caregiver ability to obtain necessary supplies Assess patient/caregiver ability to perform ulcer/skin care regimen upon admission and as needed Assess ulceration(s) every visit Notes: Electronic Signature(s) Signed: 10/21/2019 4:09:28 PM  By: Army Melia Entered By: Army Melia on 10/21/2019 14:55:40 Klayman, [name] ([id number]) -------------------------------------------------------------------------------- Pain Assessment Details Patient Name: [name], [name] Date of Service: 10/21/2019 2:00 PM Medical Record Number: [id number] Patient Account Number: [id number] Date of Birth/Sex: 1956/05/19 (64 y.o. Female) Treating RN: Army Melia Primary Care  [place]ia Witz: Neta Ehlers Other Clinician: Referring Hicks Feick: Neta Ehlers Treating Jaquanna Ballentine/Extender: Melburn Hake, HOYT Weeks in Treatment: 7 Active Problems Location of Pain Severity and Description of Pain Patient Has Paino No Site Locations Pain Management and Medication Current Pain Management: Electronic Signature(s) Signed: 10/21/2019 4:09:28 PM By: Army Melia Signed: 10/21/2019 4:36:47 PM By: Lorine Bears RCP, RRT, CHT Entered By: Lorine Bears on 10/21/2019 14:18:53 [place], [name] ([id number]) -------------------------------------------------------------------------------- Patient/Caregiver Education Details Patient Name: Martha Siren. Date of Service: 10/21/2019 2:00 PM Medical Record Number: [id number] Patient Account Number: [id number] Date of Birth/Gender: [birth year] (64 y.o. Female) Treating RN: Army Melia Primary Care Physician: Neta Ehlers Other Clinician: Referring Physician: Neta Ehlers Treating Physician/Extender: Sharalyn Ink in Treatment: 7 Education Assessment Education Provided To: Patient Education Topics Provided Wound/Skin Impairment: Handouts: Caring for Your Ulcer Methods: Demonstration, Explain/Verbal Responses: State content correctly Electronic Signature(s) Signed: 10/21/2019 4:09:28 PM By: Army Melia Entered By: Army Melia on 10/21/2019 15:04:28 Cameron, [place] ([id number]) -------------------------------------------------------------------------------- Wound Assessment Details Patient Name: Martha Downs L. Date of Service: 10/21/2019 2:00 PM Medical Record Number: [id number] Patient Account Number: [id number] Date of Birth/Sex: 04-09-1956 (64 y.o. Female) Treating RN: Montey Hora Primary Care Quitman Norberto: Neta Ehlers Other Clinician: Referring Chaelyn Bunyan: Neta Ehlers Treating Hasten Sweitzer/Extender: Melburn Hake, HOYT Weeks in Treatment: 7 Wound Status Wound Number: 1 Primary  Lymphedema Etiology: Wound Location: Left Lower Leg - Circumferential Wound Status: Open Wounding Event: Gradually Appeared Comorbid Anemia, Coronary Artery Disease, Date Acquired: 08/04/2019 History: Hypertension Weeks Of Treatment: 7 Clustered Wound: No Photos Wound Measurements Length: (cm) 15 Width: (cm) 22 Depth: (cm) 0.1 Area: (cm) 259.181 Volume: (cm) 25.918 % Reduction in Area: 74.8% % Reduction in Volume: 74.8% Epithelialization: Medium (34-66%) Tunneling: No Undermining: No Wound Description Classification: Partial Thickness Wound Margin: Indistinct, nonvisible Exudate Amount: Large Exudate Type: Serous Exudate Color: amber Wound Bed Granulation Amount: Large (67-100%) Exposed Structure Granulation Quality: Red Fascia Exposed: No Necrotic Amount: Small (1-33%) Fat Layer (Subcutaneous Tissue) Exposed: Yes Necrotic Quality: Adherent Slough Tendon Exposed: No Muscle Exposed: No Joint Exposed: No Bone Exposed: No Treatment Notes [place], [name] ([id number]) Wound #1 (Left, Circumferential Lower Leg) Notes silver cell, xtrasorb, 4 layer Electronic Signature(s) Signed: 10/21/2019 4:40:05 PM By: Montey Hora Entered By: Montey Hora on 10/21/2019 14:24:31 Fujikawa, [name] -------------------------------------------------------------------------------- Wound Assessment Details Patient Name: Martha Downs L. Date of Service: 10/21/2019 2:00 PM Medical Record Number: [id number] Patient Account Number: [id number] Date of Birth/Sex: November 19, 1955 (64 y.o. Female) Treating RN: Montey Hora Primary Care Maison Kestenbaum: Neta Ehlers Other Clinician: Referring Lew Prout: Neta Ehlers Treating Zion Ta/Extender: Melburn Hake, HOYT Weeks in Treatment: 7 Wound Status Wound Number: 2 Primary Lymphedema Etiology: Wound Location: Right Lower Leg - Circumferential Wound Status: Open Wounding Event: Gradually Appeared Comorbid Anemia, Coronary Artery  Disease, Date Acquired: 08/04/2019 History: Hypertension Weeks Of Treatment: 7 Clustered Wound: No Photos Wound Measurements Length: (cm) 1.5 Width: (cm) 1 Depth: (cm) 0.1 Area: (cm) 1.178 Volume: (cm) 0.118 % Reduction in Area: 99.6% % Reduction in Volume: 99.6% Epithelialization: None Tunneling: No Undermining: No Wound Description Classification: Partial Thickness Foul Odo Exudate Amount: Medium Slough/F Exudate Type: Serous Exudate Color: amber r  After Cleansing: No ibrino Yes Wound Bed Granulation Amount: None Present (0%) Exposed Structure Necrotic Amount: Large (67-100%) Fascia Exposed: No Necrotic Quality: Eschar Fat Layer (Subcutaneous Tissue) Exposed: Yes Tendon Exposed: No Muscle Exposed: No Joint Exposed: No Bone Exposed: No Treatment Notes Wound #2 (Right, Circumferential Lower Leg) Peets, [name] Notes silver cell, xtrasorb, 4 layer Electronic Signature(s) Signed: 10/21/2019 4:40:05 PM By: Montey Hora Entered By: Montey Hora on 10/21/2019 14:25:02 Colan, [name] -------------------------------------------------------------------------------- Vitals Details Patient Name: Martha Downs L. Date of Service: 10/21/2019 2:00 PM Medical Record Number: [id number] Patient Account Number: [id number] Date of Birth/Sex: Aug 28, 1956 (63 y.o. Female) Treating RN: Army Melia Primary Care Raza Bayless: Neta Ehlers Other Clinician: Referring Bobak Oguinn: Neta Ehlers Treating Roselee Tayloe/Extender: Melburn Hake, HOYT Weeks in Treatment: 7 Vital Signs Time Taken: 14:15 Temperature (F): 98.2 Height (in): 62 Pulse (bpm): 90 Weight (lbs): 290 Respiratory Rate (breaths/min): 18 Body Mass Index (BMI): 53 Blood Pressure (mmHg): 149/60 Reference Range: 80 - 120 mg / dl Electronic Signature(s) Signed: 10/21/2019 4:36:47 PM By: Lorine Bears RCP, RRT, CHT Entered By: Becky Sax, [name] on 10/21/2019 14:19:22

## 2019-10-22 ENCOUNTER — Ambulatory Visit: Payer: PRIVATE HEALTH INSURANCE | Admitting: Physician Assistant

## 2019-10-22 ENCOUNTER — Encounter: Payer: Self-pay | Admitting: Cardiology

## 2019-10-22 ENCOUNTER — Telehealth (INDEPENDENT_AMBULATORY_CARE_PROVIDER_SITE_OTHER): Payer: Medicare Other | Admitting: Cardiology

## 2019-10-22 VITALS — BP 139/86 | HR 102 | Ht 61.0 in | Wt 275.0 lb

## 2019-10-22 DIAGNOSIS — G4733 Obstructive sleep apnea (adult) (pediatric): Secondary | ICD-10-CM

## 2019-10-22 DIAGNOSIS — Z954 Presence of other heart-valve replacement: Secondary | ICD-10-CM | POA: Diagnosis not present

## 2019-10-22 DIAGNOSIS — I110 Hypertensive heart disease with heart failure: Secondary | ICD-10-CM | POA: Diagnosis not present

## 2019-10-22 DIAGNOSIS — D508 Other iron deficiency anemias: Secondary | ICD-10-CM | POA: Diagnosis not present

## 2019-10-22 DIAGNOSIS — Z7901 Long term (current) use of anticoagulants: Secondary | ICD-10-CM

## 2019-10-22 NOTE — Patient Instructions (Addendum)
Medication Instructions:  Your physician recommends that you continue on your current medications as directed. Please refer to the Current Medication list given to you today.  *If you need a refill on your cardiac medications before your next appointment, please call your pharmacy*  Lab Work: None ordered  If you have labs (blood work) drawn today and your tests are completely normal, you will receive your results only by: Marland Kitchen MyChart Message (if you have MyChart) OR . A paper copy in the mail If you have any lab test that is abnormal or we need to change your treatment, we will call you to review the results.  Testing/Procedures: None ordered  Follow-Up: At [facility], you and your health needs are our priority.  As part of our continuing mission to provide you with exceptional heart care, we have created designated Provider Care Teams.  These Care Teams include your primary Cardiologist (physician) and Advanced Practice Providers (APPs -  Physician Assistants and Nurse Practitioners) who all work together to provide you with the care you need, when you need it.  Your next appointment:   1 month(s)    11/19/2019 AT 2:55  The format for your next appointment:   Virtual Visit   Provider:   Shirlee More, MD  Other Instructions

## 2019-10-22 NOTE — Progress Notes (Signed)
Virtual Visit via Video Note   This visit type was conducted due to national recommendations for restrictions regarding the COVID-19 Pandemic (e.g. social distancing) in an effort to limit this patient's exposure and mitigate transmission in our community.  Due to her co-morbid illnesses, this patient is at least at moderate risk for complications without adequate follow up.  This format is felt to be most appropriate for this patient at this time.  All issues noted in this document were discussed and addressed.  A limited physical exam was performed with this format.  Please refer to the patient's chart for her consent to telehealth for [facility].   Date:  10/22/2019   ID:  [name], DOB [birth year], MRN [id number]  Patient Location: Home Provider Location: Office  PCP:  [name], MD  Cardiologist:  Shirlee More, MD  Electrophysiologist:  None   Evaluation Performed:  Follow-Up Visit  Chief Complaint: Heart failure  History of Present Illness:     [name] is a 64 y.o. female with severe symptomatic aortic stenosis with heart failure and underwent 23 mm [place] SAVR on 03/30/13 Dr Clementeen Graham at [facility] , hypertension and heart failure. Recent TEE showed dynamic LVOT obstruction and normal AVR function. She was  diagnosed with  severe obstructive and central sleep apnea with nocturnal hypoxemia nadir 59% and initiated on CPAP which was changed to BiPAP.  Her other problems include nonhealing leg ulcers and dysfunctional uterine bleeding pending D&C.  She was admitted to [facility] with chest pain 03/30/2019.  Coronary angiography was normal.  She underwent left and right heart catheterization showing elevated left atrial pressure and increased gradient across her valve.  She previously underwent transesophageal echocardiogram and was found to have dynamic LV outflow tract obstruction.Marland Kitchen She was admitted to [facility] 06/09/2019 with TIA underwent evaluation  with transesophageal echocardiogram showing normal AVR function but TAVR protocol cardiac CTA implied leaflet thrombosis despite her severe anemia with dysfunctional uterine bleeding she was anticoagulated with warfarin.  Her hospital telemetry showed frequent PACs and brief runs of atrial tachycardia she was also noted to have a patent foramen ovale  She was last seen 09/24/2019 with decompensated heart failure.  Diuretic was increased The patient does not have symptoms concerning for COVID-19 infection (fever, chills, cough, or new shortness of breath).   She is followed by gynecology with iron deficiency anemia with chronic blood loss associated with postmenopausal bleeding.  She has received IV iron infusion.  CBC 10/19/2019 hemoglobin 8.6 MCV is diminished at 71 consistent with iron deficiency.  Her iron remains low at 26 and transferrin saturation at 10%.  The labs are from [facility] gynecology.  Her last INR is therapeutic managed in our warfarin clinic  Ref Range & Units 2 d ago 9 d ago 2 wk ago 3 wk ago  INR 2.0 - 3.0 3.1Abnormal   2.8  1.7Abnormal   1.6Abnormal     She is feeling improved less short of breath right lower extremity longer weeps and has minimal weeping from the venous ulcer on her left leg.  Her weight is down a total of 22 to 25 pounds although it is plateaued she will continue her current increased dose diuretic is due for her second IV iron infusion next week and I asked her to request to have renal function checked at that time.  She is not having chest pain palpitation or syncope she is short of breath with activities  but no orthopnea. Past Medical History:  Diagnosis Date  . Aortic stenosis 03/23/2013   Overview:  02/18/13 TTE EF >55%. Critical AS with mean Ao valve gradient of 82 mm Hg. No AI. No MR, PR, mild TR. Estimated RVSP 30 mm Hg.  . Bicuspid aortic valve   . Edema of both legs 02/20/2017  . 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 Heart  failure ([name])   . HTN (hypertension) 03/23/2013  . 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 Morbid obesity ([place]) 02/20/2017  . Sleep apnea    Past Surgical History:  Procedure Laterality Date  . BUBBLE STUDY  06/07/2019   Procedure: BUBBLE STUDY;  Surgeon: Buford Dresser, MD;  Location: [place];  Service: Cardiovascular;;  . CHOLECYSTECTOMY  1983  . RIGHT/LEFT HEART CATH AND CORONARY ANGIOGRAPHY N/A 04/01/2019   Procedure: RIGHT/LEFT HEART CATH AND CORONARY ANGIOGRAPHY;  Surgeon: Lorretta Harp, MD;  Location: [place] CV LAB;  Service: Cardiovascular;  Laterality: N/A;  . TEE WITHOUT CARDIOVERSION N/A 07/01/2018   Procedure: TRANSESOPHAGEAL ECHOCARDIOGRAM (TEE);  Surgeon: Buford Dresser, MD;  Location: [facility] ENDOSCOPY;  Service: Cardiovascular;  Laterality: N/A;  . TEE WITHOUT CARDIOVERSION N/A 06/07/2019   Procedure: TRANSESOPHAGEAL ECHOCARDIOGRAM (TEE);  Surgeon: Buford Dresser, MD;  Location: [facility] ENDOSCOPY;  Service: Cardiovascular;  Laterality: N/A;  . TISSUE AORTIC VALVE REPLACEMENT       No outpatient medications have been marked as taking for the 10/22/19 encounter (Appointment) with Richardo Priest, MD.     Allergies:   Vancomycin   Social History   Tobacco Use  . Smoking status: Never Smoker  . Smokeless tobacco: Never Used  Substance Use Topics  . Alcohol use: No  . Drug use: No     Family Hx: The patient's family history includes Heart attack in her mother; Parkinson's disease in her father.  ROS:   Please see the history of present illness.     All other systems reviewed and are negative.   Prior CV studies:   The following studies were reviewed today:    Labs/Other Tests and Data Reviewed:    EKG:  No ECG reviewed.  Recent Labs: 02/12/2019: NT-Pro BNP 640 06/03/2019: ALT 18 06/05/2019: TSH 5.585 06/08/2019: Magnesium 2.2 09/14/2019: BUN 11; Creatinine, Ser 0.77; Hemoglobin 8.8; Platelets 362; Potassium 3.9; Sodium 137 09/15/2019: B  Natriuretic Peptide 149.4   Recent Lipid Panel Lab Results  Component Value Date/Time   CHOL 123 06/04/2019 05:07 AM   CHOL 125 02/12/2019 11:16 AM   TRIG 111 06/04/2019 05:07 AM   HDL 30 (L) 06/04/2019 05:07 AM   HDL 37 (L) 02/12/2019 11:16 AM   CHOLHDL 4.1 06/04/2019 05:07 AM   LDLCALC 71 06/04/2019 05:07 AM   LDLCALC 64 02/12/2019 11:16 AM    Wt Readings from Last 3 Encounters:  10/07/19 278 lb 12.8 oz (126.5 kg)  09/24/19 279 lb (126.6 kg)  09/16/19 283 lb 1.9 oz (128.4 kg)     Objective:    Vital Signs:  There were no vitals taken for this visit.   VITAL SIGNS:  reviewed GEN:  no acute distress EYES:  sclerae anicteric, EOMI - Extraocular Movements Intact RESPIRATORY:  normal respiratory effort, symmetric expansion CARDIOVASCULAR:  no peripheral edema SKIN:  no rash, lesions or ulcers. MUSCULOSKELETAL:  no obvious deformities. NEURO:  alert and oriented x 3, no obvious focal deficit PSYCH:  normal affect Note she still has residual edema both lower extremities ASSESSMENT & PLAN:    1. Heart  failure improved continue her current diuretic my expectation is or correction of iron deficiency anemia her heart failure will steadily improve she will have a renal profile checked when she has IV iron infusion and will follow up with me virtually in 4 weeks. 2. IV iron deficiency she is receiving IV iron her hemoglobin is yet to improve with the patient is subjectively improved and heart failure continue iron infusion from her gynecologist in [place] 3. Sleep apnea improved she is on CPAP and she tolerates it 4. Anticoagulation stable followed in warfarin clinic 5. Status post AVR stable anticoagulated with leaflet thrombosis  COVID-19 Education: The signs and symptoms of COVID-19 were discussed with the patient and how to seek care for testing (follow up with PCP or arrange E-visit).  The importance of social distancing was discussed today.  Time:   Today, I have spent 18  minutes with the patient with telehealth technology discussing the above problems.     Medication Adjustments/Labs and Tests Ordered: Current medicines are reviewed at length with the patient today.  Concerns regarding medicines are outlined above.   Tests Ordered: No orders of the defined types were placed in this encounter.   Medication Changes: No orders of the defined types were placed in this encounter.   Follow Up:  Virtual Visit  in 4 week(s)  Signed, Shirlee More, MD  10/22/2019 1:15 PM    [facility]

## 2019-10-25 ENCOUNTER — Ambulatory Visit (INDEPENDENT_AMBULATORY_CARE_PROVIDER_SITE_OTHER): Payer: Medicare Other | Admitting: Cardiovascular Disease

## 2019-10-25 DIAGNOSIS — Z7901 Long term (current) use of anticoagulants: Secondary | ICD-10-CM

## 2019-10-25 DIAGNOSIS — G459 Transient cerebral ischemic attack, unspecified: Secondary | ICD-10-CM

## 2019-10-25 DIAGNOSIS — Z954 Presence of other heart-valve replacement: Secondary | ICD-10-CM

## 2019-10-25 LAB — POCT INR: INR: 2.5 (ref 2.0–3.0)

## 2019-10-28 ENCOUNTER — Ambulatory Visit: Payer: Medicare Other | Admitting: Physician Assistant

## 2019-10-29 ENCOUNTER — Other Ambulatory Visit: Payer: Self-pay

## 2019-10-29 ENCOUNTER — Encounter: Payer: Medicare Other | Admitting: Physician Assistant

## 2019-10-29 DIAGNOSIS — L97822 Non-pressure chronic ulcer of other part of left lower leg with fat layer exposed: Secondary | ICD-10-CM | POA: Diagnosis not present

## 2019-10-29 NOTE — Progress Notes (Addendum)
CORLEEN, SHERLIN ([id number]) Visit Report for 10/29/2019 Chief Complaint Document Details Patient Name: [name], [name]. Date of Service: 10/29/2019 1:30 PM Medical Record Number: [id number] Patient Account Number: [id number] Date of Birth/Sex: 03/25/1956 (64 y.o. F) Treating RN: Montey Hora Primary Care Provider: Neta Ehlers Other Clinician: Referring Provider: Neta Ehlers Treating Provider/Extender: Melburn Hake, Lindsay Soulliere Weeks in Treatment: 9 Information Obtained from: Patient Chief Complaint Bilateral LE Lymphedema and ulcers Electronic Signature(s) Signed: 10/29/2019 2:26:08 PM By: Worthy Keeler PA-C Entered By: Worthy Keeler on 10/29/2019 14:26:08 [place], [place]. ([id number]) -------------------------------------------------------------------------------- HPI Details Patient Name: Martha Downs L. Date of Service: 10/29/2019 1:30 PM Medical Record Number: [id number] Patient Account Number: [id number] Date of Birth/Sex: February 18, 1956 (64 y.o. F) Treating RN: Montey Hora Primary Care Provider: Neta Ehlers Other Clinician: Referring Provider: Neta Ehlers Treating Provider/Extender: Melburn Hake, Param Capri Weeks in Treatment: 9 History of Present Illness HPI Description: 08/27/2019 patient presents today for initial evaluation here in our clinic concerning issues that she has been having with her bilateral lower extremities with lymphedema and weeping. She is previously been a patient over the past year of Dr. Jerilee Hoh at the wound care center in [facility]. With that being said she subsequently was recommended by the lymphedema clinic here in [place] to follow-up with [place] here instead of going back to [place] since they were in closer contact with the clinic here than they are obviously. With that being said the patient states that is why she came her way in order to be evaluated at this point. Fortunately there is no signs of active infection at this time.  No fevers, chills, nausea, vomiting, or diarrhea. The patient does have a history of lymphedema, hypertension, long-term use of anticoagulant therapy, and obstructive sleep apnea. During the visit today and from checking into the discharge she was prone to unexpectedly falling asleep. I did discuss this with her daughter as well who states that this is very common for her she states she is not really sure how compliant her mother is with her CPAP machine that may be part of the issue. The patient did have ABIs performed which revealed have copies of his well today that was on 05/01/2019 and it showed a right ABI of 1.17 with a TBI of 1.12 and a left ABI of 1.24 with a TBI of 0.96. She has had 2 transfusions in the past 2 weeks apparently as well. 08/31/2019 upon evaluation today patient actually appears to be doing somewhat better in regard to her lower extremity ulcers. She still has a tremendous amount of drainage from the left lower extremity unfortunately but her right lower extremity is doing much better which is great news. Overall I am very pleased in this regard. With regard to home health we finally got things settled so home health will be coming out to see her this would be very beneficial in my opinion as I think they will be able to help her tremendously with [name] frequent dressing changes. 12/28-Patient is back here at 1 week visit, with regard to her bilateral lower extremity ulcers, she has a significant degree of lymphedema, significant degree of drainage [name] from the left and which continues to be an issue, patient had not been using her lymphedema pumps as recommended, she is concerned about amount of drainage and how quickly it soaks the dressing, she is concerned about the lymphedema pump sticking to her skin and was told to use it on the wraps.  She will get in touch with her cardiologist about adjusting her Lasix dose. 09/14/2019 on evaluation today patient appears to be doing  poorly in regard to her lower extremities especially left lower extremity. Unfortunately she is continue to have a significant amount of drainage which is preventing her from being able to heal keeping the wounds very macerated. She also has some warmth to touch in the anterior to medial portion of the left lower extremity compared to the remainder of the wound which does have made somewhat concerned about the possibility of cellulitis. Obviously it can be difficult to determine just strictly lymphedema versus cellulitis but right now I am somewhat concerned. She tells me that she can put a wrap on and at least has to change this twice a day or else she will be walking around leaving puddles of water behind her. Obviously this is fairly significant. She did see her primary care provider they did not see anything going on that they felt needed to be addressed from a diuretic standpoint but nonetheless I am still somewhat concerned based on what we are seeing to be honest. The patient and her husband are likewise concerned. 09/17/2019 patient is seen today in follow-up I last saw her on Tuesday at that point based on the severity of her lower extremity edema, weeping, and erythema I did send her to the ER for further evaluation and treatment. She subsequently did go for that evaluation. It did appear that based on my review of the notes this is dated 09/14/2019 that the patient did have a BNP of 149.4. Which was notable. Subsequently she was given a dose of IV vancomycin as well as doxycycline and Keflex. It appears however that she was discharged on oral clindamycin 150 mg every 6 hours and to be honest compared to what I saw on Tuesday she actually seems to be doing somewhat better. Fortunately there is no evidence of active infection at this time which is good news. In the interim she did yesterday see her cardiologist as well Dr. Bettina Gavia. That was on 09/16/2019. Upon review of this note it does appear  that his recommendation at this point was to increase the furosemide to 40 mg 2 times a day. He also recommend an increase of potassium 10 mg taken 2 times a day. His other recommendation was that the patient needs to have a follow-up with her hematology oncologist in order to see if they can do anything about iron transfusions for her as unfortunately her iron is still quite low and obviously oral supplementation is not going to be the best Mccaster, Ballinger ([id number]) way to get this up. 09/24/2019 upon evaluation today patient appears to be doing quite well with regard to her lower extremities. The right lower extremity in particular has dramatically improved to be honest so has the left in my opinion. There does not appear to be any signs of active infection which is good news. No fevers, chills, nausea, vomiting, or diarrhea. Patient is still taking the clindamycin at this time. 10/01/2019 on evaluation today patient appears to be showing some signs of improvement at this point in regard to her lower extremities. The right lower extremity is doing quite well the left lower extremity is still draining but again I do believe this is significantly better compared to what it was in the past. Fortunately there is no evidence of active infection at this time. No fever chills noted 10/08/2019 upon evaluation today patient appears to have smaller  areas of erythema and weeping on the left lower extremity as compared to last week's evaluation. The right leg is doing excellent. With that being said unfortunately she is still having a lot of significant swelling on the left in fact the swelling is worse today than it was last week. With that being said she is telling me that she is able to keep her wraps on now for at least the day sometimes even up to 30 hours before they are having to be changed. When they do have to change it they are using her lymphedema wraps at home to reapply what they can. She  only changes that she tells me when it actually gets to the point that is draining so much that it leaks onto her floor. 10/15/2019 upon evaluation today patient appears to actually be doing very well with regard to her lower extremities in fact I believe the right lower extremity may be completely sealed up that we will likely watch it 1 [name] week. The left lower extremity is doing better she does not seem to be having as much weeping and leaking but still this is going on. Home health still has not gotten the [place] there is still just using ABD pads I believe was able to get Lauraine Rinne this would actually help out. 10/21/2019 upon evaluation today patient appears to be doing much better in regard to her lower extremities bilaterally. The right lower extremity has 1 area remaining that still draining the left lower extremity is still draining but not nearly what it was in the past. Overall I am extremely pleased with how things seem to be progressing. 10/29/2019 upon evaluation today patient actually appears to be doing quite well in regard to her lower extremities bilaterally both extremities show signs of improvement. The biggest issue we seem to be having is that unfortunately the home health nurse does not seem to be wrapping this quite right in that it seems to slide down within just a few hours of them coming out to rewrap her according to the patient and her husband. The wraps we put on the state last much longer in fact generally until they are taken off. Fortunately there is no signs of infection although unfortunately she does still have a few open areas on both lower extremities at this time. Electronic Signature(s) Signed: 10/29/2019 2:40:20 PM By: Worthy Keeler PA-C Entered By: Worthy Keeler on 10/29/2019 14:40:20 [place], [name] ([id number]) -------------------------------------------------------------------------------- Physical Exam Details Patient Name: SHATORI, LIBERA L. Date  of Service: 10/29/2019 1:30 PM Medical Record Number: [id number] Patient Account Number: [id number] Date of Birth/Sex: 1955-11-28 (64 y.o. F) Treating RN: Montey Hora Primary Care Provider: Neta Ehlers Other Clinician: Referring Provider: Neta Ehlers Treating Provider/Extender: Melburn Hake, Cal Gindlesperger Weeks in Treatment: 9 Constitutional Obese and well-hydrated in no acute distress. Respiratory normal breathing without difficulty. Psychiatric this patient is able to make decisions and demonstrates good insight into disease process. Alert and Oriented x 3. pleasant and cooperative. Notes Wound bed currently showed signs of good epithelization bilaterally which is excellent news. With that being said I did not have to perform any sharp debridement today. The patient's legs however are [name] swollen today compared to what they generally are. Electronic Signature(s) Signed: 10/29/2019 2:41:08 PM By: Worthy Keeler PA-C Entered By: Worthy Keeler on 10/29/2019 14:41:08 [name], [name] ([id number]) -------------------------------------------------------------------------------- Physician Orders Details Patient Name: [name]. Date of Service: 10/29/2019 1:30 PM Medical Record Number: [id number] Patient  Account Number: [id number] Date of Birth/Sex: 1955/11/17 (64 y.o. 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 - Do not get your wraps wet Wound #2 Right,Lateral Lower Leg o Cleanse wound with mild soap and water o May shower with protection. - Do not get your wraps wet Wound #3 Left,Lateral Lower Leg o Cleanse wound with mild soap and water o May shower with protection. - Do not get your wraps wet Wound #4 Right,Posterior Lower Leg o Cleanse wound with mild soap and water o May shower with protection. - Do not get your wraps wet Wound #5 Left,Proximal Lower Leg o Cleanse wound with mild soap and water o May shower with protection. - Do not get your wraps wet Skin Barriers/Peri-Wound Care o Triamcinolone Acetonide Ointment (TCA) - to left posterior lower leg Primary Wound Dressing Wound #1 Left,Medial,Anterior Lower Leg o Silver Alginate Wound #3 Left,Lateral Lower Leg o Silver Alginate Wound #4 Right,Posterior Lower Leg o Silver Alginate Wound #5 Left,Proximal Lower Leg o Silver Alginate [place], [name] Wound #2 Right,Lateral Lower Leg o Silver Alginate Secondary Dressing Wound #1 Left,Medial,Anterior Lower Leg o ABD pad Wound #2 Right,Lateral Lower Leg o ABD pad Wound #3 Left,Lateral Lower Leg o ABD pad Wound #4 Right,Posterior Lower Leg o ABD pad Wound #5 Left,Proximal Lower Leg o ABD pad Dressing Change Frequency Wound #1 Left,Medial,Anterior Lower Leg o Change Dressing Monday, Wednesday, Friday - Friday in office Wound #2 Right,Lateral Lower Leg o Change Dressing Monday, Wednesday, Friday - Friday in office Wound #3 Left,Lateral Lower Leg o Change Dressing Monday, Wednesday, Friday - Friday in office Wound #4 Right,Posterior Lower Leg o Change Dressing Monday, Wednesday, Friday - Friday in office Wound #5 Left,Proximal Lower Leg o Change Dressing Monday, Wednesday, Friday - Friday in office Follow-up  Appointments Wound #1 Left,Medial,Anterior Lower Leg o Return Appointment in 1 week. Wound #2 Right,Lateral Lower Leg o Return Appointment in 1 week. Wound #3 Left,Lateral Lower Leg o Return Appointment in 1 week. Wound #4 Right,Posterior Lower Leg o Return Appointment in 1 week. Wound #5 Left,Proximal Lower Leg o Return Appointment in 1 week. Edema Control Wound #1 Left,Medial,Anterior Lower Leg o 4 Layer Compression System - Bilateral - Wrap from toes to 3cm below the knee Neddo, [name] Wound #2 Right,Lateral Lower Leg o 4 Layer Compression System - Bilateral - Wrap from toes to 3cm below the knee Wound #3 Left,Lateral Lower Leg o 4 Layer Compression System - Bilateral - Wrap from toes to 3cm below the knee Wound #4 Right,Posterior Lower Leg o 4 Layer Compression System - Bilateral - Wrap from toes to 3cm below the knee Wound #5 Left,Proximal Lower Leg o 4 Layer Compression System - Bilateral - Wrap from toes to 3cm below the knee Home Health Wound #1 Left,Medial,Anterior Lower Leg o [place] Visits - [place] Nurse may visit PRN to address patientos wound care needs. o FACE  TO FACE ENCOUNTER: MEDICARE and MEDICAID PATIENTS: I certify that this patient is under my care and that I had a face-to-face encounter that meets the physician face-to-face encounter requirements with this patient on this date. The encounter with the patient was in whole or in part for the following MEDICAL CONDITION: (primary reason for [place]) MEDICAL NECESSITY: I certify, that based on my findings, NURSING services are a medically necessary home health service. HOME BOUND STATUS: I certify that my clinical findings support that this patient is homebound (i.e., Due to illness or injury, pt requires aid of supportive devices such as crutches, cane, wheelchairs, walkers, the use of special transportation or the assistance of another  person to leave their place of residence. There is a normal inability to leave the home and doing so requires considerable and taxing effort. Other absences are for medical reasons / religious services and are infrequent or of short duration when for other reasons). o If current dressing causes regression in wound condition, may D/C ordered dressing product/s and apply Normal Saline Moist Dressing daily until next [place] / Other MD appointment. [place] of regression in wound condition at [phone]. o Please direct any NON-WOUND related issues/requests for orders to patient's Primary Care Physician Wound #3 Left,Lateral Lower Leg o [place] Visits - [place] Nurse may visit PRN to address patientos wound care needs. o FACE TO FACE ENCOUNTER: MEDICARE and MEDICAID PATIENTS: I certify that this patient is under my care and that I had a face-to-face encounter that meets the physician face-to-face encounter requirements with this patient on this date. The encounter with the patient was in whole or in part for the following MEDICAL CONDITION: (primary reason for [place]) MEDICAL NECESSITY: I certify, that based on my findings, NURSING services are a medically necessary home health service. HOME BOUND STATUS: I certify that my clinical findings support that this patient is homebound (i.e., Due to illness or injury, pt requires aid of supportive devices such as crutches, cane, wheelchairs, walkers, the use of special transportation or the assistance of another person to leave their place of residence. There is a normal inability to leave the home and doing so requires considerable and taxing effort. Other absences are for medical reasons / religious services and are infrequent or of short duration when for other reasons). o If current dressing causes regression in wound condition, may D/C ordered dressing product/s and  apply Normal Saline Moist Dressing daily until next [place] / Other MD appointment. [place] of regression in wound condition at [phone]. o Please direct any NON-WOUND related issues/requests for orders to patient's Primary Care Physician Wound #4 [place] Visits - [place] Nurse may visit PRN to address patientos wound care needs. o FACE TO FACE ENCOUNTER: MEDICARE and MEDICAID PATIENTS: I certify that this patient is under my care and that I had a face-to-face encounter that meets the physician face-to-face encounter requirements with this patient on this date. The encounter with the patient was in whole or in part for the following MEDICAL CONDITION: (primary reason for [place]) MEDICAL NECESSITY: I certify, that based on my findings, NURSING services are a medically necessary home health service. HOME BOUND STATUS: I certify that my BRECKLYNN, CURE. ([id number]) clinical findings support that this patient is homebound (i.e., Due to illness or injury, pt requires aid of supportive devices such as crutches, cane,  wheelchairs, walkers, the use of special transportation or the assistance of another person to leave their place of residence. There is a normal inability to leave the home and doing so requires considerable and taxing effort. Other absences are for medical reasons / religious services and are infrequent or of short duration when for other reasons). o If current dressing causes regression in wound condition, may D/C ordered dressing product/s and apply Normal Saline Moist Dressing daily until next [place] / Other MD appointment. [place] of regression in wound condition at [phone]. o Please direct any NON-WOUND related issues/requests for orders to patient's Primary Care Physician Wound #5 Left,Proximal Lower Leg o [place]  Visits - [place] Nurse may visit PRN to address patientos wound care needs. o FACE TO FACE ENCOUNTER: MEDICARE and MEDICAID PATIENTS: I certify that this patient is under my care and that I had a face-to-face encounter that meets the physician face-to-face encounter requirements with this patient on this date. The encounter with the patient was in whole or in part for the following MEDICAL CONDITION: (primary reason for [place]) MEDICAL NECESSITY: I certify, that based on my findings, NURSING services are a medically necessary home health service. HOME BOUND STATUS: I certify that my clinical findings support that this patient is homebound (i.e., Due to illness or injury, pt requires aid of supportive devices such as crutches, cane, wheelchairs, walkers, the use of special transportation or the assistance of another person to leave their place of residence. There is a normal inability to leave the home and doing so requires considerable and taxing effort. Other absences are for medical reasons / religious services and are infrequent or of short duration when for other reasons). o If current dressing causes regression in wound condition, may D/C ordered dressing product/s and apply Normal Saline Moist Dressing daily until next [place] / Other MD appointment. [place] of regression in wound condition at [phone]. o Please direct any NON-WOUND related issues/requests for orders to patient's Primary Care Physician Wound #2 Right,Lateral Lower Leg o [place] Visits - [place] Nurse may visit PRN to address patientos wound care needs. o FACE TO FACE ENCOUNTER: MEDICARE and MEDICAID PATIENTS: I certify that this patient is under my care and that I had a face-to-face encounter that meets the physician face-to-face encounter requirements with this patient on this date. The encounter with the patient was in  whole or in part for the following MEDICAL CONDITION: (primary reason for [place]) MEDICAL NECESSITY: I certify, that based on my findings, NURSING services are a medically necessary home health service. HOME BOUND STATUS: I certify that my clinical findings support that this patient is homebound (i.e., Due to illness or injury, pt requires aid of supportive devices such as crutches, cane, wheelchairs, walkers, the use of special transportation or the assistance of another person to leave their place of residence. There is a normal inability to leave the home and doing so requires considerable and taxing effort. Other absences are for medical reasons / religious services and are infrequent or of short duration when for other reasons). o If current dressing causes regression in wound condition, may D/C ordered dressing product/s and apply Normal Saline Moist Dressing daily until next [place] / Other MD appointment. [place] of regression in wound condition at [phone]. o Please direct any NON-WOUND related issues/requests for orders to patient's Primary Care  Physician Electronic Signature(s) Signed: 10/29/2019 4:25:46 PM By: Montey Hora Signed: 10/31/2019 11:36:00 PM By: Worthy Keeler PA-C Entered By: Montey Hora on 10/29/2019 14:35:15 [name], [name] ([id number]) -------------------------------------------------------------------------------- Problem List Details Patient Name: EVVIE, MESIC L. Date of Service: 10/29/2019 1:30 PM Medical Record Number: [id number] Patient Account Number: [id number] Date of Birth/Sex: 11-Feb-1956 (64 y.o. F) Treating RN: Montey Hora Primary Care Provider: Neta Ehlers Other Clinician: Referring Provider: Neta Ehlers Treating Provider/Extender: Melburn Hake, Sohail Capraro Weeks in Treatment: 9 Active Problems ICD-10 Evaluated Encounter Code Description Active Date Today Diagnosis I89.0 Lymphedema, not  elsewhere classified 08/27/2019 No Yes L97.812 Non-pressure chronic ulcer of other part of right lower leg 08/27/2019 No Yes with fat layer exposed L97.822 Non-pressure chronic ulcer of other part of left lower leg with 08/27/2019 No Yes fat layer exposed I10 Essential (primary) hypertension 08/27/2019 No Yes Z79.01 Long term (current) use of anticoagulants 08/27/2019 No Yes Inactive Problems Resolved Problems Electronic Signature(s) Signed: 10/29/2019 2:26:02 PM By: Worthy Keeler PA-C Entered By: Worthy Keeler on 10/29/2019 14:26:02 [place], [place]. ([id number]) -------------------------------------------------------------------------------- Progress Note Details Patient Name: Martha Downs L. Date of Service: 10/29/2019 1:30 PM Medical Record Number: [id number] Patient Account Number: [id number] Date of Birth/Sex: 1955/12/12 (64 y.o. F) Treating RN: Montey Hora Primary Care Provider: Neta Ehlers Other Clinician: Referring Provider: Neta Ehlers Treating Provider/Extender: Melburn Hake, Tabby Beaston Weeks in Treatment: 9 Subjective Chief Complaint Information obtained from Patient Bilateral LE Lymphedema and ulcers History of Present Illness (HPI) 08/27/2019 patient presents today for initial evaluation here in our clinic concerning issues that she has been having with her bilateral lower extremities with lymphedema and weeping. She is previously been a patient over the past year of Dr. Jerilee Hoh at the wound care center in [facility]. With that being said she subsequently was recommended by the lymphedema clinic here in [place] to follow-up with [place] here instead of going back to [place] since they were in closer contact with the clinic here than they are obviously. With that being said the patient states that is why she came her way in order to be evaluated at this point. Fortunately there is no signs of active infection at this time. No fevers, chills, nausea,  vomiting, or diarrhea. The patient does have a history of lymphedema, hypertension, long-term use of anticoagulant therapy, and obstructive sleep apnea. During the visit today and from checking into the discharge she was prone to unexpectedly falling asleep. I did discuss this with her daughter as well who states that this is very common for her she states she is not really sure how compliant her mother is with her CPAP machine that may be part of the issue. The patient did have ABIs performed which revealed have copies of his well today that was on 05/01/2019 and it showed a right ABI of 1.17 with a TBI of 1.12 and a left ABI of 1.24 with a TBI of 0.96. She has had 2 transfusions in the past 2 weeks apparently as well. 08/31/2019 upon evaluation today patient actually appears to be doing somewhat better in regard to her lower extremity ulcers. She still has a tremendous amount of drainage from the left lower extremity unfortunately but her right lower extremity is doing much better which is great news. Overall I am very pleased in this regard. With regard to home health we finally got things settled so home health will be coming out to see her this would be very beneficial in  my opinion as I think they will be able to help her tremendously with [name] frequent dressing changes. 12/28-Patient is back here at 1 week visit, with regard to her bilateral lower extremity ulcers, she has a significant degree of lymphedema, significant degree of drainage [name] from the left and which continues to be an issue, patient had not been using her lymphedema pumps as recommended, she is concerned about amount of drainage and how quickly it soaks the dressing, she is concerned about the lymphedema pump sticking to her skin and was told to use it on the wraps. She will get in touch with her cardiologist about adjusting her Lasix dose. 09/14/2019 on evaluation today patient appears to be doing poorly in regard to her lower  extremities especially left lower extremity. Unfortunately she is continue to have a significant amount of drainage which is preventing her from being able to heal keeping the wounds very macerated. She also has some warmth to touch in the anterior to medial portion of the left lower extremity compared to the remainder of the wound which does have made somewhat concerned about the possibility of cellulitis. Obviously it can be difficult to determine just strictly lymphedema versus cellulitis but right now I am somewhat concerned. She tells me that she can put a wrap on and at least has to change this twice a day or else she will be walking around leaving puddles of water behind her. Obviously this is fairly significant. She did see her primary care provider they did not see anything going on that they felt needed to be addressed from a diuretic standpoint but nonetheless I am still somewhat concerned based on what we are seeing to be honest. The patient and her husband are likewise concerned. 09/17/2019 patient is seen today in follow-up I last saw her on Tuesday at that point based on the severity of her lower extremity edema, weeping, and erythema I did send her to the ER for further evaluation and treatment. She subsequently did go for that evaluation. It did appear that based on my review of the notes this is dated 09/14/2019 that the patient did have a BNP of 149.4. Which was notable. Subsequently she was given a dose of IV vancomycin as well as doxycycline and Keflex. It appears however that she was discharged on oral clindamycin 150 mg every 6 hours and to be honest compared to what I saw on Tuesday she actually seems to be doing somewhat better. Fortunately there is no evidence of active infection at this time [name], [name]. ([id number]) which is good news. In the interim she did yesterday see her cardiologist as well Dr. Bettina Gavia. That was on 09/16/2019. Upon review of this note it does appear  that his recommendation at this point was to increase the furosemide to 40 mg 2 times a day. He also recommend an increase of potassium 10 mg taken 2 times a day. His other recommendation was that the patient needs to have a follow-up with her hematology oncologist in order to see if they can do anything about iron transfusions for her as unfortunately her iron is still quite low and obviously oral supplementation is not going to be the best way to get this up. 09/24/2019 upon evaluation today patient appears to be doing quite well with regard to her lower extremities. The right lower extremity in particular has dramatically improved to be honest so has the left in my opinion. There does not appear to be any signs of  active infection which is good news. No fevers, chills, nausea, vomiting, or diarrhea. Patient is still taking the clindamycin at this time. 10/01/2019 on evaluation today patient appears to be showing some signs of improvement at this point in regard to her lower extremities. The right lower extremity is doing quite well the left lower extremity is still draining but again I do believe this is significantly better compared to what it was in the past. Fortunately there is no evidence of active infection at this time. No fever chills noted 10/08/2019 upon evaluation today patient appears to have smaller areas of erythema and weeping on the left lower extremity as compared to last week's evaluation. The right leg is doing excellent. With that being said unfortunately she is still having a lot of significant swelling on the left in fact the swelling is worse today than it was last week. With that being said she is telling me that she is able to keep her wraps on now for at least the day sometimes even up to 30 hours before they are having to be changed. When they do have to change it they are using her lymphedema wraps at home to reapply what they can. She only changes that she tells me when  it actually gets to the point that is draining so much that it leaks onto her floor. 10/15/2019 upon evaluation today patient appears to actually be doing very well with regard to her lower extremities in fact I believe the right lower extremity may be completely sealed up that we will likely watch it 1 [name] week. The left lower extremity is doing better she does not seem to be having as much weeping and leaking but still this is going on. Home health still has not gotten the [place] there is still just using ABD pads I believe was able to get Lauraine Rinne this would actually help out. 10/21/2019 upon evaluation today patient appears to be doing much better in regard to her lower extremities bilaterally. The right lower extremity has 1 area remaining that still draining the left lower extremity is still draining but not nearly what it was in the past. Overall I am extremely pleased with how things seem to be progressing. 10/29/2019 upon evaluation today patient actually appears to be doing quite well in regard to her lower extremities bilaterally both extremities show signs of improvement. The biggest issue we seem to be having is that unfortunately the home health nurse does not seem to be wrapping this quite right in that it seems to slide down within just a few hours of them coming out to rewrap her according to the patient and her husband. The wraps we put on the state last much longer in fact generally until they are taken off. Fortunately there is no signs of infection although unfortunately she does still have a few open areas on both lower extremities at this time. Objective Constitutional Obese and well-hydrated in no acute distress. Vitals Time Taken: 1:56 PM, Height: 62 in, Weight: 290 lbs, BMI: 53, Temperature: 97.8 F, Pulse: 91 bpm, Respiratory Rate: 18 breaths/min, Blood Pressure: 157/62 mmHg. Respiratory normal breathing without difficulty. [name], [name]  ([id number]) Psychiatric this patient is able to make decisions and demonstrates good insight into disease process. Alert and Oriented x 3. pleasant and cooperative. General Notes: Wound bed currently showed signs of good epithelization bilaterally which is excellent news. With that being said I did not have to perform any sharp debridement today. The patient's legs  however are [name] swollen today compared to what they generally are. Integumentary (Hair, Skin) Wound #1 status is Open. Original cause of wound was Gradually Appeared. The wound is located on the Left,Medial,Anterior Lower Leg. The wound measures 1cm length x 3cm width x 0.1cm depth; 2.356cm^2 area and 0.236cm^3 volume. There is Fat Layer (Subcutaneous Tissue) Exposed exposed. There is no tunneling or undermining noted. There is a large amount of serous drainage noted. The wound margin is indistinct and nonvisible. There is large (67- 100%) red granulation within the wound bed. There is no necrotic tissue within the wound bed. Wound #2 status is Open. Original cause of wound was Gradually Appeared. The wound is located on the Right,Lateral Lower Leg. The wound measures 1cm length x 0.6cm width x 0.1cm depth; 0.471cm^2 area and 0.047cm^3 volume. There is Fat Layer (Subcutaneous Tissue) Exposed exposed. There is no tunneling or undermining noted. There is a medium amount of serous drainage noted. The wound margin is flat and intact. There is no granulation within the wound bed. There is a large (67-100%) amount of necrotic tissue within the wound bed including Eschar. Wound #3 status is Open. Original cause of wound was Gradually Appeared. The wound is located on the Left,Lateral Lower Leg. The wound measures 4.5cm length x 6cm width x 0.2cm depth; 21.206cm^2 area and 4.241cm^3 volume. There is Fat Layer (Subcutaneous Tissue) Exposed exposed. There is no tunneling or undermining noted. There is a large amount of serous drainage noted.  The wound margin is flat and intact. There is small (1-33%) red granulation within the wound bed. There is a large (67-100%) amount of necrotic tissue within the wound bed including Adherent Slough. Wound #4 status is Open. Original cause of wound was Gradually Appeared. The wound is located on the Right,Posterior Lower Leg. The wound measures 0.6cm length x 0.5cm width x 0.1cm depth; 0.236cm^2 area and 0.024cm^3 volume. There is no tunneling or undermining noted. There is a medium amount of serous drainage noted. The wound margin is flat and intact. There is no granulation within the wound bed. There is a large (67-100%) amount of necrotic tissue within the wound bed including Eschar and Adherent Slough. Wound #5 status is Open. Original cause of wound was Gradually Appeared. The wound is located on the Left,Proximal Lower Leg. The wound measures 0.6cm length x 0.5cm width x 0.1cm depth; 0.236cm^2 area and 0.024cm^3 volume. The wound is limited to skin breakdown. There is no tunneling or undermining noted. There is a large amount of serous drainage noted. The wound margin is flat and intact. There is no granulation within the wound bed. There is no necrotic tissue within the wound bed. Assessment Active Problems ICD-10 Lymphedema, not elsewhere classified Non-pressure chronic ulcer of other part of right lower leg with fat layer exposed Non-pressure chronic ulcer of other part of left lower leg with fat layer exposed Essential (primary) hypertension Long term (current) use of anticoagulants [name], [name] Procedures Wound #1 Pre-procedure diagnosis of Wound #1 is a Lymphedema located on the Left,Medial,Anterior Lower Leg . There was a Four Layer Compression Therapy Procedure with a pre-treatment ABI of 1.2 by Montey Hora, RN. Post procedure Diagnosis Wound #1: Same as Pre-Procedure Wound #2 Pre-procedure diagnosis of Wound #2 is a Lymphedema located on the Right,Lateral  Lower Leg . There was a Four Layer Compression Therapy Procedure with a pre-treatment ABI of 1.2 by Montey Hora, RN. Post procedure Diagnosis Wound #2: Same as Pre-Procedure Wound #3 Pre-procedure diagnosis of  Wound #3 is a Lymphedema located on the Left,Lateral Lower Leg . There was a Four Layer Compression Therapy Procedure with a pre-treatment ABI of 1.2 by Montey Hora, RN. Post procedure Diagnosis Wound #3: Same as Pre-Procedure Wound #4 Pre-procedure diagnosis of Wound #4 is a Lymphedema located on the Right,Posterior Lower Leg . There was a Four Layer Compression Therapy Procedure with a pre-treatment ABI of 1.2 by Montey Hora, RN. Post procedure Diagnosis Wound #4: Same as Pre-Procedure Wound #5 Pre-procedure diagnosis of Wound #5 is a Lymphedema located on the Left,Proximal Lower Leg . There was a Four Layer Compression Therapy Procedure with a pre-treatment ABI of 1.2 by Montey Hora, RN. Post procedure Diagnosis Wound #5: Same as Pre-Procedure Plan Wound Cleansing: Wound #1 Left,Medial,Anterior Lower Leg: Cleanse wound with mild soap and water May shower with protection. - Do not get your wraps wet Wound #2 Right,Lateral Lower Leg: Cleanse wound with mild soap and water May shower with protection. - Do not get your wraps wet Wound #3 Left,Lateral Lower Leg: Cleanse wound with mild soap and water May shower with protection. - Do not get your wraps wet Wound #4 Right,Posterior Lower Leg: Cleanse wound with mild soap and water May shower with protection. - Do not get your wraps wet Wound #5 Left,Proximal Lower Leg: Cleanse wound with mild soap and water May shower with protection. - Do not get your wraps wet Skin Barriers/Peri-Wound Care: Triamcinolone Acetonide Ointment (TCA) - to left posterior lower leg Primary Wound Dressing: [name], [name]. ([id number]) Wound #1 Left,Medial,Anterior Lower Leg: Silver Alginate Wound #3 Left,Lateral Lower Leg: Silver  Alginate Wound #4 Right,Posterior Lower Leg: Silver Alginate Wound #5 Left,Proximal Lower Leg: Silver Alginate Wound #2 Right,Lateral Lower Leg: Silver Alginate Secondary Dressing: Wound #1 Left,Medial,Anterior Lower Leg: ABD pad Wound #2 Right,Lateral Lower Leg: ABD pad Wound #3 Left,Lateral Lower Leg: ABD pad Wound #4 Right,Posterior Lower Leg: ABD pad Wound #5 Left,Proximal Lower Leg: ABD pad Dressing Change Frequency: Wound #1 Left,Medial,Anterior Lower Leg: Change Dressing Monday, Wednesday, Friday - Friday in office Wound #2 Right,Lateral Lower Leg: Change Dressing Monday, Wednesday, Friday - Friday in office Wound #3 Left,Lateral Lower Leg: Change Dressing Monday, Wednesday, Friday - Friday in office Wound #4 Right,Posterior Lower Leg: Change Dressing Monday, Wednesday, Friday - Friday in office Wound #5 Left,Proximal Lower Leg: Change Dressing Monday, Wednesday, Friday - Friday in office Follow-up Appointments: Wound #1 Left,Medial,Anterior Lower Leg: Return Appointment in 1 week. Wound #2 Right,Lateral Lower Leg: Return Appointment in 1 week. Wound #3 Left,Lateral Lower Leg: Return Appointment in 1 week. Wound #4 Right,Posterior Lower Leg: Return Appointment in 1 week. Wound #5 Left,Proximal Lower Leg: Return Appointment in 1 week. Edema Control: Wound #1 Left,Medial,Anterior Lower Leg: 4 Layer Compression System - Bilateral - Wrap from toes to 3cm below the knee Wound #2 Right,Lateral Lower Leg: 4 Layer Compression System - Bilateral - Wrap from toes to 3cm below the knee Wound #3 Left,Lateral Lower Leg: 4 Layer Compression System - Bilateral - Wrap from toes to 3cm below the knee Wound #4 Right,Posterior Lower Leg: 4 Layer Compression System - Bilateral - Wrap from toes to 3cm below the knee Wound #5 Left,Proximal Lower Leg: 4 Layer Compression System - Bilateral - Wrap from toes to 3cm below the knee Home Health: Wound #1 Left,Medial,Anterior Lower  Leg: Continue Home Health Visits - [place] Nurse may visit PRN to address patient s wound care needs. FACE TO FACE ENCOUNTER: MEDICARE and MEDICAID PATIENTS: I certify that  this patient is under my care and that I had a face-to-face encounter that meets the physician face-to-face encounter requirements with this patient on this date. The [place] ([id number]) encounter with the patient was in whole or in part for the following MEDICAL CONDITION: (primary reason for Home Healthcare) MEDICAL NECESSITY: I certify, that based on my findings, NURSING services are a medically necessary home health service. HOME BOUND STATUS: I certify that my clinical findings support that this patient is homebound (i.e., Due to illness or injury, pt requires aid of supportive devices such as crutches, cane, wheelchairs, walkers, the use of special transportation or the assistance of another person to leave their place of residence. There is a normal inability to leave the home and doing so requires considerable and taxing effort. Other absences are for medical reasons / religious services and are infrequent or of short duration when for other reasons). If current dressing causes regression in wound condition, may D/C ordered dressing product/s and apply Normal Saline Moist Dressing daily until next [place] / Other MD appointment. [place] of regression in wound condition at [phone]. Please direct any NON-WOUND related issues/requests for orders to patient's Primary Care Physician Wound #3 Left,Lateral Lower Leg: [place] Visits - [place] Nurse may visit PRN to address patient s wound care needs. FACE TO FACE ENCOUNTER: MEDICARE and MEDICAID PATIENTS: I certify that this patient is under my care and that I had a face-to-face encounter that meets the physician face-to-face encounter requirements with this patient on this date.  The encounter with the patient was in whole or in part for the following MEDICAL CONDITION: (primary reason for [place]) MEDICAL NECESSITY: I certify, that based on my findings, NURSING services are a medically necessary home health service. HOME BOUND STATUS: I certify that my clinical findings support that this patient is homebound (i.e., Due to illness or injury, pt requires aid of supportive devices such as crutches, cane, wheelchairs, walkers, the use of special transportation or the assistance of another person to leave their place of residence. There is a normal inability to leave the home and doing so requires considerable and taxing effort. Other absences are for medical reasons / religious services and are infrequent or of short duration when for other reasons). If current dressing causes regression in wound condition, may D/C ordered dressing product/s and apply Normal Saline Moist Dressing daily until next [place] / Other MD appointment. [place] of regression in wound condition at [phone]. Please direct any NON-WOUND related issues/requests for orders to patient's Primary Care Physician Wound #4 Right,Posterior Lower Leg: [place] Visits - [place] Nurse may visit PRN to address patient s wound care needs. FACE TO FACE ENCOUNTER: MEDICARE and MEDICAID PATIENTS: I certify that this patient is under my care and that I had a face-to-face encounter that meets the physician face-to-face encounter requirements with this patient on this date. The encounter with the patient was in whole or in part for the following MEDICAL CONDITION: (primary reason for [place]) MEDICAL NECESSITY: I certify, that based on my findings, NURSING services are a medically necessary home health service. HOME BOUND STATUS: I certify that my clinical findings support that this patient is homebound (i.e., Due to illness or injury, pt  requires aid of supportive devices such as crutches, cane, wheelchairs, walkers, the use of special transportation or the assistance of another person to leave their place of  residence. There is a normal inability to leave the home and doing so requires considerable and taxing effort. Other absences are for medical reasons / religious services and are infrequent or of short duration when for other reasons). If current dressing causes regression in wound condition, may D/C ordered dressing product/s and apply Normal Saline Moist Dressing daily until next [place] / Other MD appointment. [place] of regression in wound condition at [phone]. Please direct any NON-WOUND related issues/requests for orders to patient's Primary Care Physician Wound #5 Left,Proximal Lower Leg: [place] Visits - [place] Nurse may visit PRN to address patient s wound care needs. FACE TO FACE ENCOUNTER: MEDICARE and MEDICAID PATIENTS: I certify that this patient is under my care and that I had a face-to-face encounter that meets the physician face-to-face encounter requirements with this patient on this date. The encounter with the patient was in whole or in part for the following MEDICAL CONDITION: (primary reason for [place]) MEDICAL NECESSITY: I certify, that based on my findings, NURSING services are a medically necessary home health service. HOME BOUND STATUS: I certify that my clinical findings support that this patient is homebound (i.e., Due to illness or injury, pt requires aid of supportive devices such as crutches, cane, wheelchairs, walkers, the use of special transportation or the assistance of another person to leave their place of residence. There is a normal inability to leave the home and doing so requires considerable and taxing effort. Other absences are for medical reasons / religious services and are infrequent or of short duration when  for other reasons). If current dressing causes regression in wound condition, may D/C ordered dressing product/s and apply Normal Saline Moist Dressing daily until next [place] / Other MD appointment. [place] of regression in wound condition at [phone]. Please direct any NON-WOUND related issues/requests for orders to patient's Primary Care Physician Wound #2 Right,Lateral Lower Leg: [name], [name] ([id number]) [place] Visits - [place] Nurse may visit PRN to address patient s wound care needs. FACE TO FACE ENCOUNTER: MEDICARE and MEDICAID PATIENTS: I certify that this patient is under my care and that I had a face-to-face encounter that meets the physician face-to-face encounter requirements with this patient on this date. The encounter with the patient was in whole or in part for the following MEDICAL CONDITION: (primary reason for [place]) MEDICAL NECESSITY: I certify, that based on my findings, NURSING services are a medically necessary home health service. HOME BOUND STATUS: I certify that my clinical findings support that this patient is homebound (i.e., Due to illness or injury, pt requires aid of supportive devices such as crutches, cane, wheelchairs, walkers, the use of special transportation or the assistance of another person to leave their place of residence. There is a normal inability to leave the home and doing so requires considerable and taxing effort. Other absences are for medical reasons / religious services and are infrequent or of short duration when for other reasons). If current dressing causes regression in wound condition, may D/C ordered dressing product/s and apply Normal Saline Moist Dressing daily until next [place] / Other MD appointment. [place] of regression in wound condition at [phone]. Please direct any NON-WOUND related issues/requests for orders to  patient's Primary Care Physician 1. My suggestion at this time is good to be that we going continue with the current wound care measures including the triamcinolone  to the legs followed by the 4 layer compression wrap bilaterally which I think is doing a good job of keeping the edema under good control. 2. I would recommend as well patient continue to elevate her legs much as possible on her own to try to keep the edema under good control obviously the [name] she can keep this elevated up the better off she will be. 3. I do think she can be active as far as walking and getting around disinfecting help with pumping some of the fluid out of her extremities as well. We will see patient back for reevaluation in 1 week here in the clinic. If anything worsens or changes patient will contact our office for additional recommendations. Electronic Signature(s) Signed: 10/29/2019 2:42:06 PM By: Worthy Keeler PA-C Entered By: Worthy Keeler on 10/29/2019 14:42:06 Vetrano, [name] ([id number]) -------------------------------------------------------------------------------- SuperBill Details Patient Name: [name]. Date of Service: 10/29/2019 Medical Record Number: [id number] Patient Account Number: [id number] Date of Birth/Sex: Aug 05, 1956 (64 y.o. 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

## 2019-11-01 ENCOUNTER — Ambulatory Visit (INDEPENDENT_AMBULATORY_CARE_PROVIDER_SITE_OTHER): Payer: Medicare Other | Admitting: Pharmacist Clinician (PhC)/ Clinical Pharmacy Specialist

## 2019-11-01 DIAGNOSIS — Z7901 Long term (current) use of anticoagulants: Secondary | ICD-10-CM

## 2019-11-01 DIAGNOSIS — Z954 Presence of other heart-valve replacement: Secondary | ICD-10-CM | POA: Diagnosis not present

## 2019-11-01 DIAGNOSIS — G459 Transient cerebral ischemic attack, unspecified: Secondary | ICD-10-CM

## 2019-11-01 LAB — POCT INR: INR: 2.3 (ref 2.0–3.0)

## 2019-11-02 ENCOUNTER — Telehealth: Payer: Self-pay | Admitting: *Deleted

## 2019-11-02 DIAGNOSIS — I110 Hypertensive heart disease with heart failure: Secondary | ICD-10-CM

## 2019-11-02 DIAGNOSIS — I5032 Chronic diastolic (congestive) heart failure: Secondary | ICD-10-CM

## 2019-11-02 NOTE — Telephone Encounter (Signed)
Dr. Bettina Gavia, since [name] has had shortness of breath, tiredness, swelling of lower legs, back and leg pain, thickening of heart muscle etc etc. Is there anything in your testing that might indicate ATTR ?   Dr. Bettina Gavia, we received this message through the message board at [address] and I was asked to pass it on to you. Please advise

## 2019-11-02 NOTE — Telephone Encounter (Signed)
Dr. Bettina Gavia has addressed this call.

## 2019-11-03 NOTE — Addendum Note (Signed)
Addended by: Particia Nearing B on: 11/03/2019 01:58 PM   Modules accepted: Orders

## 2019-11-03 NOTE — Telephone Encounter (Signed)
Per Dr Bettina Gavia a referral to the CHF clinic was made for Dr Jeffie Pollock. Left message on voicemail of patient that they will call her with a date and time of the appointment and if she hasn't heard from them in 1-2 weeks to call back.

## 2019-11-04 ENCOUNTER — Ambulatory Visit: Payer: Medicare Other | Admitting: Physician Assistant

## 2019-11-05 ENCOUNTER — Encounter: Payer: Medicare Other | Admitting: Physician Assistant

## 2019-11-12 ENCOUNTER — Encounter: Payer: Medicare Other | Attending: Physician Assistant | Admitting: Physician Assistant

## 2019-11-12 ENCOUNTER — Other Ambulatory Visit: Payer: Self-pay

## 2019-11-12 DIAGNOSIS — Z7901 Long term (current) use of anticoagulants: Secondary | ICD-10-CM | POA: Insufficient documentation

## 2019-11-12 DIAGNOSIS — L97822 Non-pressure chronic ulcer of other part of left lower leg with fat layer exposed: Secondary | ICD-10-CM | POA: Diagnosis not present

## 2019-11-12 DIAGNOSIS — L97812 Non-pressure chronic ulcer of other part of right lower leg with fat layer exposed: Secondary | ICD-10-CM | POA: Insufficient documentation

## 2019-11-12 DIAGNOSIS — I890 Lymphedema, not elsewhere classified: Secondary | ICD-10-CM | POA: Insufficient documentation

## 2019-11-12 DIAGNOSIS — I10 Essential (primary) hypertension: Secondary | ICD-10-CM | POA: Diagnosis not present

## 2019-11-12 NOTE — Progress Notes (Addendum)
[name], [name] ([id number]) Visit Report for 11/12/2019 Arrival Information Details Patient Name: [name], [name]. Date of Service: 11/12/2019 11:00 AM Medical Record Number: [id number] Patient Account Number: [id number] Date of Birth/Sex: [birth year] (64 y.o. F) Treating RN: Army Melia Primary Care Huma Imhoff: Neta Ehlers Other Clinician: Referring Siani Utke: Neta Ehlers Treating Dustyn Dansereau/Extender: Melburn Hake, HOYT Weeks in Treatment: 11 Visit Information History Since Last Visit Added or deleted any medications: No Patient Arrived: Ambulatory Any new allergies or adverse reactions: No Arrival Time: 11:33 Had a fall or experienced change in No Accompanied By: husband activities of daily living that may affect Transfer Assistance: None risk of falls: Patient Identification Verified: Yes Signs or symptoms of abuse/neglect since last visito No Patient Has Alerts: Yes Hospitalized since last visit: No Patient Alerts: Patient on Blood Thinner Has Dressing in Place as Prescribed: Yes warfarin Pain Present Now: No Not Diabetic 04/29/2019 [facility] ABI R)1.17 L)1.24 TBI R) 1.12 L) 0.96 Electronic Signature(s) Signed: 11/15/2019 8:02:25 AM By: Army Melia Entered By: Army Melia on 11/12/2019 11:33:16 [name], [name] ([id number]) -------------------------------------------------------------------------------- Compression Therapy Details Patient Name: Martha Downs L. Date of Service: 11/12/2019 11:00 AM Medical Record Number: [id number] Patient Account Number: [id number] Date of Birth/Sex: [birth year] (64 y.o. F) Treating RN: Montey Hora Primary Care Tyler Cubit: Neta Ehlers Other Clinician: Referring Shannel Zahm: Neta Ehlers Treating Shron Ozer/Extender: Melburn Hake, HOYT Weeks in Treatment: 11 Compression Therapy Performed for Wound Assessment: Wound #1 Left,Medial,Anterior Lower Leg Performed By: Clinician Montey Hora, RN Compression Type: Four Layer Pre Treatment ABI:  1.2 Post Procedure Diagnosis Same as Pre-procedure Electronic Signature(s) Signed: 11/12/2019 4:34:53 PM By: Montey Hora Entered By: Montey Hora on 11/12/2019 11:59:38 [name], [name] -------------------------------------------------------------------------------- Compression Therapy Details Patient Name: Martha Downs L. Date of Service: 11/12/2019 11:00 AM Medical Record Number: [id number] Patient Account Number: [id number] Date of Birth/Sex: 04-17-1956 (64 y.o. F) Treating RN: Montey Hora Primary Care Kem Hensen: Neta Ehlers Other Clinician: Referring Zack Crager: Neta Ehlers Treating Lyal Husted/Extender: Melburn Hake, HOYT Weeks in Treatment: 11 Compression Therapy Performed for Wound Assessment: Wound #2 Right,Lateral Lower Leg Performed By: Clinician Montey Hora, RN Compression Type: Four Layer Pre Treatment ABI: 1.2 Post Procedure Diagnosis Same as Pre-procedure Electronic Signature(s) Signed: 11/12/2019 4:34:53 PM By: Montey Hora Entered By: Montey Hora on 11/12/2019 11:59:38 [name], [name] -------------------------------------------------------------------------------- Compression Therapy Details Patient Name: Martha Downs L. Date of Service: 11/12/2019 11:00 AM Medical Record Number: [id number] Patient Account Number: [id number] Date of Birth/Sex: 11-19-1955 (64 y.o. F) Treating RN: Montey Hora Primary Care Andrianna Manalang: Neta Ehlers Other Clinician: Referring Kaitlyne Friedhoff: Neta Ehlers Treating Carlis Burnsworth/Extender: Melburn Hake, HOYT Weeks in Treatment: 11 Compression Therapy Performed for Wound Assessment: Wound #3 Left,Lateral Lower Leg Performed By: Clinician Montey Hora, RN Compression Type: Four Layer Pre Treatment ABI: 1.2 Post Procedure Diagnosis Same as Pre-procedure Electronic Signature(s) Signed: 11/12/2019 4:34:53 PM By: Montey Hora Entered By: Montey Hora on 11/12/2019 11:59:38 [name], [name]  ([id number]) -------------------------------------------------------------------------------- Compression Therapy Details Patient Name: Martha Downs L. Date of Service: 11/12/2019 11:00 AM Medical Record Number: [id number] Patient Account Number: [id number] Date of Birth/Sex: 1955/12/29 (64 y.o. F) Treating RN: Montey Hora Primary Care Francesa Eugenio: Neta Ehlers Other Clinician: Referring Destany Severns: Neta Ehlers Treating Johnelle Tafolla/Extender: Melburn Hake, HOYT Weeks in Treatment: 11 Compression Therapy Performed for Wound Assessment: Wound #4 Right,Posterior Lower Leg Performed By: Clinician Montey Hora, RN Compression Type: Four Layer Pre Treatment ABI: 1.2 Post Procedure Diagnosis Same as Pre-procedure Electronic Signature(s) Signed: 11/12/2019 4:34:53 PM  By: Montey Hora Entered By: Montey Hora on 11/12/2019 11:59:39 [name], [name] Carlean Jews ([id number]) -------------------------------------------------------------------------------- Compression Therapy Details Patient Name: Martha Downs L. Date of Service: 11/12/2019 11:00 AM Medical Record Number: [id number] Patient Account Number: [id number] Date of Birth/Sex: 02/08/1956 (64 y.o. F) Treating RN: Montey Hora Primary Care Babygirl Trager: Neta Ehlers Other Clinician: Referring Sondra Blixt: Neta Ehlers Treating Maelle Sheaffer/Extender: Melburn Hake, HOYT Weeks in Treatment: 11 Compression Therapy Performed for Wound Assessment: Wound #5 Left,Proximal Lower Leg Performed By: Clinician Montey Hora, RN Compression Type: Four Layer Pre Treatment ABI: 1.2 Post Procedure Diagnosis Same as Pre-procedure Electronic Signature(s) Signed: 11/12/2019 4:34:53 PM By: Montey Hora Entered By: Montey Hora on 11/12/2019 11:59:39 [name], [name] ([id number]) -------------------------------------------------------------------------------- Encounter Discharge Information Details Patient Name: [name], [name] Date of Service: 11/12/2019 11:00  AM Medical Record Number: [id number] Patient Account Number: [id number] Date of Birth/Sex: 1955-09-15 (64 y.o. F) Treating RN: Montey Hora Primary Care Crayton Savarese: Neta Ehlers Other Clinician: Referring Osborne Serio: Neta Ehlers Treating Yi Falletta/Extender: Sharalyn Ink in Treatment: 11 Encounter Discharge Information Items Discharge Condition: Stable Ambulatory Status: Ambulatory Discharge Destination: Home Transportation: Private Auto Accompanied By: husband Schedule Follow-up Appointment: Yes Clinical Summary of Care: Electronic Signature(s) Signed: 11/12/2019 4:34:53 PM By: Montey Hora Entered By: Montey Hora on 11/12/2019 12:01:33 [name], [name] ([id number]) -------------------------------------------------------------------------------- Lower Extremity Assessment Details Patient Name: Martha Downs L. Date of Service: 11/12/2019 11:00 AM Medical Record Number: [id number] Patient Account Number: [id number] Date of Birth/Sex: Mar 24, 1956 (64 y.o. F) Treating RN: Army Melia Primary Care Bebe Moncure: Neta Ehlers Other Clinician: Referring Bentleigh Stankus: Neta Ehlers Treating Tikia Skilton/Extender: Melburn Hake, HOYT Weeks in Treatment: 11 Edema Assessment Assessed: [Left: No] [Right: No] Edema: [Left: Yes] [Right: Yes] Calf Left: Right: Point of Measurement: 32 cm From Medial Instep 67 cm 51 cm Ankle Left: Right: Point of Measurement: 10 cm From Medial Instep 38 cm 32 cm Vascular Assessment Pulses: Dorsalis Pedis Palpable: [Left:Yes] [Right:Yes] Electronic Signature(s) Signed: 11/15/2019 8:02:25 AM By: Army Melia Entered By: Army Melia on 11/12/2019 11:41:17 [place]aver, [place] L. ([id number]) -------------------------------------------------------------------------------- Multi Wound Chart Details Patient Name: Martha Downs L. Date of Service: 11/12/2019 11:00 AM Medical Record Number: [id number] Patient Account Number: [id number] Date of Birth/Sex: [birth year]  (64 y.o. F) Treating RN: Montey Hora Primary Care Jaycee Mckellips: Neta Ehlers Other Clinician: Referring Kendi Defalco: Neta Ehlers Treating Millie Forde/Extender: Melburn Hake, HOYT Weeks in Treatment: 11 Vital Signs Height(in): 5 Pulse(bpm): 51 Weight(lbs): 290 Blood Pressure(mmHg): 156/66 Body Mass Index(BMI): 53 Temperature(F): 97.8 Respiratory Rate(breaths/min): 16 Photos: Wound Location: Left Lower Leg - Medial, Anterior Right Lower Leg - Lateral Left Lower Leg - Lateral Wounding Event: Gradually Appeared Gradually Appeared Gradually Appeared Primary Etiology: Lymphedema Lymphedema Lymphedema Comorbid History: Anemia, Coronary Artery Disease, Anemia, Coronary Artery Disease, Anemia, Coronary Artery Disease, Hypertension Hypertension Hypertension Date Acquired: 08/04/2019 08/04/2019 10/22/2019 Weeks of Treatment: 11 11 2  Wound Status: Open Open Open Measurements L x W x D (cm) 1.7x2.7x0.1 0.8x0.3x0.1 0.5x0.5x0.1 Area (cm) : 3.605 0.188 0.196 Volume (cm) : 0.36 0.019 0.02 % Reduction in Area: 99.60% 99.90% 99.10% % Reduction in Volume: 99.70% 99.90% 99.50% Classification: Partial Thickness Partial Thickness Partial Thickness Exudate Amount: Large Medium Large Exudate Type: Serous Serous Serous Exudate Color: amber amber amber Wound Margin: Indistinct, nonvisible Flat and Intact Flat and Intact Granulation Amount: Large (67-100%) None Present (0%) Large (67-100%) Granulation Quality: Red N/A Red Necrotic Amount: None Present (0%) Large (67-100%) Small (1-33%) Necrotic Tissue: N/A Eschar Adherent Slough Exposed Structures: Fat Layer (Subcutaneous Tissue) Fat  Layer (Subcutaneous Tissue) Fat Layer (Subcutaneous Tissue) Exposed: Yes Exposed: Yes Exposed: Yes Fascia: No Fascia: No Fascia: No Tendon: No Tendon: No Tendon: No Muscle: No Muscle: No Muscle: No Joint: No Joint: No Joint: No Bone: No Bone: No Bone: No Epithelialization: Large (67-100%) None Small  (1-33%) Wound Number: 4 5 N/A Photos: N/A [name], [name] ([id number]) Wound Location: Right Lower Leg - Posterior Left Lower Leg - Proximal N/A Wounding Event: Gradually Appeared Gradually Appeared N/A Primary Etiology: Lymphedema Lymphedema N/A Comorbid History: Anemia, Coronary Artery Disease, Anemia, Coronary Artery Disease, N/A Hypertension Hypertension Date Acquired: 10/21/2019 10/21/2019 N/A Weeks of Treatment: 2 2 N/A Wound Status: Open Open N/A Measurements L x W x D (cm) 0.5x0.5x0.1 0.5x0.7x0.1 N/A Area (cm) : 0.196 0.275 N/A Volume (cm) : 0.02 0.027 N/A % Reduction in Area: 16.90% -16.50% N/A % Reduction in Volume: 16.70% -12.50% N/A Classification: Partial Thickness Partial Thickness N/A Exudate Amount: Medium Large N/A Exudate Type: Serous Serous N/A Exudate Color: amber amber N/A Wound Margin: Flat and Intact Flat and Intact N/A Granulation Amount: None Present (0%) None Present (0%) N/A Granulation Quality: N/A N/A N/A Necrotic Amount: Large (67-100%) None Present (0%) N/A Necrotic Tissue: Eschar, Adherent Slough N/A N/A Exposed Structures: Fascia: No Fascia: No N/A Fat Layer (Subcutaneous Tissue) Fat Layer (Subcutaneous Tissue) Exposed: No Exposed: No Tendon: No Tendon: No Muscle: No Muscle: No Joint: No Joint: No Bone: No Bone: No Limited to Skin Breakdown Epithelialization: None None N/A Treatment Notes Electronic Signature(s) Signed: 11/12/2019 4:34:53 PM By: Montey Hora Entered By: Montey Hora on 11/12/2019 11:56:37 Penagos, [name] ([id number]) -------------------------------------------------------------------------------- [place] Details Patient Name: [name]. Date of Service: 11/12/2019 11:00 AM Medical Record Number: [id number] Patient Account Number: [id number] Date of Birth/Sex: [birth year] (64 y.o. F) Treating RN: Montey Hora Primary Care Artemisa Sladek: Neta Ehlers Other Clinician: Referring Ronel Rodeheaver:  Neta Ehlers Treating Dominik Lauricella/Extender: Melburn Hake, HOYT Weeks in Treatment: 11 Active Inactive Abuse / Safety / Falls / Self Care Management Nursing Diagnoses: Potential for falls Goals: Patient will not experience any injury related to falls Date Initiated: 08/27/2019 Target Resolution Date: 11/20/2019 Goal Status: Active Interventions: Assess fall risk on admission and as needed Notes: Orientation to the Wound Care Program Nursing Diagnoses: Knowledge deficit related to the wound healing center program Goals: Patient/caregiver will verbalize understanding of the [facility] Date Initiated: 08/27/2019 Target Resolution Date: 11/20/2019 Goal Status: Active Interventions: Provide education on orientation to the wound center Notes: Venous Leg Ulcer Nursing Diagnoses: Potential for venous Insuffiency (use before diagnosis confirmed) Goals: Patient will maintain optimal edema control Date Initiated: 08/27/2019 Target Resolution Date: 11/20/2019 Goal Status: Active Interventions: Assess peripheral edema status every visit. Notes: Wound/Skin Impairment Nursing Diagnoses: Impaired tissue integrity [name], [name] ([id number]) Goals: Ulcer/skin breakdown will heal within 14 weeks Date Initiated: 08/27/2019 Target Resolution Date: 11/20/2019 Goal Status: Active Interventions: Assess patient/caregiver ability to obtain necessary supplies Assess patient/caregiver ability to perform ulcer/skin care regimen upon admission and as needed Assess ulceration(s) every visit Notes: Electronic Signature(s) Signed: 11/12/2019 4:34:53 PM By: Montey Hora Entered By: Montey Hora on 11/12/2019 11:56:30 Pepitone, Jamela L. ([id number]) -------------------------------------------------------------------------------- Pain Assessment Details Patient Name: Martha Downs L. Date of Service: 11/12/2019 11:00 AM Medical Record Number: [id number] Patient Account Number:  [id number] Date of Birth/Sex: [birth year] (64 y.o. F) Treating RN: Army Melia Primary Care Naarah Borgerding: Neta Ehlers Other Clinician: Referring Anahid Eskelson: Neta Ehlers Treating Fredrich Cory/Extender: Melburn Hake, HOYT Weeks in Treatment: 11 Active Problems Location  of Pain Severity and Description of Pain Patient Has Paino No Site Locations Pain Management and Medication Current Pain Management: Electronic Signature(s) Signed: 11/15/2019 8:02:25 AM By: Army Melia Entered By: Army Melia on 11/12/2019 11:37:19 Much, [name] ([id number]) -------------------------------------------------------------------------------- Patient/Caregiver Education Details Patient Name: Winder, Neoma [place] L. Date of Service: 11/12/2019 11:00 AM Medical Record Number: [id number] Patient Account Number: [id number] Date of Birth/Gender: 1956/03/05 (64 y.o. F) Treating RN: Montey Hora Primary Care Physician: Neta Ehlers Other Clinician: Referring Physician: Neta Ehlers Treating Physician/Extender: Sharalyn Ink in Treatment: 11 Education Assessment Education Provided To: Patient and Caregiver Education Topics Provided Venous: Handouts: Other: need for ongoing compression Methods: Explain/Verbal Responses: State content correctly Electronic Signature(s) Signed: 11/12/2019 4:34:53 PM By: Montey Hora Entered By: Montey Hora on 11/12/2019 12:00:42 Nawaz, [place] L. ([id number]) -------------------------------------------------------------------------------- Wound Assessment Details Patient Name: Martha Downs L. Date of Service: 11/12/2019 11:00 AM Medical Record Number: [id number] Patient Account Number: [id number] Date of Birth/Sex: [birth year] (64 y.o. F) Treating RN: Army Melia Primary Care Shahrukh Pasch: Neta Ehlers Other Clinician: Referring Cayce Paschal: Neta Ehlers Treating Korrin Waterfield/Extender: Melburn Hake, HOYT Weeks in Treatment: 11 Wound Status Wound Number: 1 Primary Etiology:  Lymphedema Wound Location: Left Lower Leg - Medial, Anterior Wound Status: Open Wounding Event: Gradually Appeared Comorbid History: Anemia, Coronary Artery Disease, Hypertension Date Acquired: 08/04/2019 Weeks Of Treatment: 11 Clustered Wound: No Photos Wound Measurements Length: (cm) 1.7 Width: (cm) 2.7 Depth: (cm) 0.1 Area: (cm) 3.60 Volume: (cm) 0.36 % Reduction in Area: 99.6% % Reduction in Volume: 99.7% Epithelialization: Large (67-100%) 5 Wound Description Classification: Partial Thickness Wound Margin: Indistinct, nonvisible Exudate Amount: Large Exudate Type: Serous Exudate Color: amber Wound Bed Granulation Amount: Large (67-100%) Exposed Structure Granulation Quality: Red Fascia Exposed: No Necrotic Amount: None Present (0%) Fat Layer (Subcutaneous Tissue) Exposed: Yes Tendon Exposed: No Muscle Exposed: No Joint Exposed: No Bone Exposed: No Treatment Notes Wound #1 (Left, Medial, Anterior Lower Leg) Notes silver cell, abd, 4 layer Electronic Signature(s) Signed: 11/12/2019 11:42:51 AM By: Augusto Gamble, [name] ([id number]) Entered By: Army Melia on 11/12/2019 11:42:51 [place], [place] ([id number]) -------------------------------------------------------------------------------- Wound Assessment Details Patient Name: Martha Downs L. Date of Service: 11/12/2019 11:00 AM Medical Record Number: [id number] Patient Account Number: [id number] Date of Birth/Sex: 23-Jun-1956 (64 y.o. F) Treating RN: Army Melia Primary Care Berta Denson: Neta Ehlers Other Clinician: Referring Zani Kyllonen: Neta Ehlers Treating Grabiel Schmutz/Extender: Melburn Hake, HOYT Weeks in Treatment: 11 Wound Status Wound Number: 2 Primary Etiology: Lymphedema Wound Location: Right Lower Leg - Lateral Wound Status: Open Wounding Event: Gradually Appeared Comorbid History: Anemia, Coronary Artery Disease, Hypertension Date Acquired: 08/04/2019 Weeks Of Treatment: 11 Clustered Wound:  No Photos Wound Measurements Length: (cm) 0.8 Width: (cm) 0.3 Depth: (cm) 0.1 Area: (cm) 0.188 Volume: (cm) 0.019 % Reduction in Area: 99.9% % Reduction in Volume: 99.9% Epithelialization: None Wound Description Classification: Partial Thickness F Wound Margin: Flat and Intact S Exudate Amount: Medium Exudate Type: Serous Exudate Color: amber oul Odor After Cleansing: No lough/Fibrino Yes Wound Bed Granulation Amount: None Present (0%) Exposed Structure Necrotic Amount: Large (67-100%) Fascia Exposed: No Necrotic Quality: Eschar Fat Layer (Subcutaneous Tissue) Exposed: Yes Tendon Exposed: No Muscle Exposed: No Joint Exposed: No Bone Exposed: No Treatment Notes Wound #2 (Right, Lateral Lower Leg) Notes silver cell, abd, 4 layer Electronic Signature(s) Signed: 11/12/2019 11:43:18 AM By: Augusto Gamble, [name] ([id number]) Entered By: Army Melia on 11/12/2019 11:43:18 [place], [name] ([id number]) -------------------------------------------------------------------------------- Wound Assessment Details Patient Name: TRENA, BLAMER  L. Date of Service: 11/12/2019 11:00 AM Medical Record Number: [id number] Patient Account Number: [id number] Date of Birth/Sex: July 02, 1956 (64 y.o. F) Treating RN: Army Melia Primary Care Kyira Volkert: Neta Ehlers Other Clinician: Referring Arissa Fagin: Neta Ehlers Treating Aidin Doane/Extender: Melburn Hake, HOYT Weeks in Treatment: 11 Wound Status Wound Number: 3 Primary Etiology: Lymphedema Wound Location: Left Lower Leg - Lateral Wound Status: Open Wounding Event: Gradually Appeared Comorbid History: Anemia, Coronary Artery Disease, Hypertension Date Acquired: 10/22/2019 Weeks Of Treatment: 2 Clustered Wound: No Photos Wound Measurements Length: (cm) 0.5 Width: (cm) 0.5 Depth: (cm) 0.1 Area: (cm) 0.19 Volume: (cm) 0.02 % Reduction in Area: 99.1% % Reduction in Volume: 99.5% Epithelialization: Small (1-33%) 6 Wound  Description Classification: Partial Thickness Fo Wound Margin: Flat and Intact Sl Exudate Amount: Large Exudate Type: Serous Exudate Color: amber ul Odor After Cleansing: No ough/Fibrino Yes Wound Bed Granulation Amount: Large (67-100%) Exposed Structure Granulation Quality: Red Fascia Exposed: No Necrotic Amount: Small (1-33%) Fat Layer (Subcutaneous Tissue) Exposed: Yes Necrotic Quality: Adherent Slough Tendon Exposed: No Muscle Exposed: No Joint Exposed: No Bone Exposed: No Treatment Notes Wound #3 (Left, Lateral Lower Leg) Notes silver cell, abd, 4 layer Electronic Signature(s) Signed: 11/12/2019 11:43:51 AM By: Augusto Gamble, [name] ([id number]) Entered By: Army Melia on 11/12/2019 11:43:51 [place], [place] ([id number]) -------------------------------------------------------------------------------- Wound Assessment Details Patient Name: Martha Downs L. Date of Service: 11/12/2019 11:00 AM Medical Record Number: [id number] Patient Account Number: [id number] Date of Birth/Sex: 10-Dec-1955 (64 y.o. F) Treating RN: Army Melia Primary Care Tharon Bomar: Neta Ehlers Other Clinician: Referring Vance Belcourt: Neta Ehlers Treating Whalen Trompeter/Extender: Melburn Hake, HOYT Weeks in Treatment: 11 Wound Status Wound Number: 4 Primary Etiology: Lymphedema Wound Location: Right Lower Leg - Posterior Wound Status: Open Wounding Event: Gradually Appeared Comorbid History: Anemia, Coronary Artery Disease, Hypertension Date Acquired: 10/21/2019 Weeks Of Treatment: 2 Clustered Wound: No Photos Wound Measurements Length: (cm) 0.5 Width: (cm) 0.5 Depth: (cm) 0.1 Area: (cm) 0.19 Volume: (cm) 0.02 % Reduction in Area: 16.9% % Reduction in Volume: 16.7% Epithelialization: None 6 Wound Description Classification: Partial Thickness Wound Margin: Flat and Intact Exudate Amount: Medium Exudate Type: Serous Exudate Color: amber Foul Odor After Cleansing: No Slough/Fibrino  Yes Wound Bed Granulation Amount: None Present (0%) Exposed Structure Necrotic Amount: Large (67-100%) Fascia Exposed: No Necrotic Quality: Eschar, Adherent Slough Fat Layer (Subcutaneous Tissue) Exposed: No Tendon Exposed: No Muscle Exposed: No Joint Exposed: No Bone Exposed: No Treatment Notes Wound #4 (Right, Posterior Lower Leg) Notes silver cell, abd, 4 layer Electronic Signature(s) Signed: 11/12/2019 11:44:11 AM By: Augusto Gamble, [name] ([id number]) Entered By: Army Melia on 11/12/2019 11:44:11 [place], [place] ([id number]) -------------------------------------------------------------------------------- Wound Assessment Details Patient Name: Martha Downs L. Date of Service: 11/12/2019 11:00 AM Medical Record Number: [id number] Patient Account Number: [id number] Date of Birth/Sex: 05/29/1956 (64 y.o. F) Treating RN: Army Melia Primary Care Authur Cubit: Neta Ehlers Other Clinician: Referring Travell Desaulniers: Neta Ehlers Treating Zoie Sarin/Extender: Melburn Hake, HOYT Weeks in Treatment: 11 Wound Status Wound Number: 5 Primary Etiology: Lymphedema Wound Location: Left Lower Leg - Proximal Wound Status: Open Wounding Event: Gradually Appeared Comorbid History: Anemia, Coronary Artery Disease, Hypertension Date Acquired: 10/21/2019 Weeks Of Treatment: 2 Clustered Wound: No Photos Wound Measurements Length: (cm) 0.5 Width: (cm) 0.7 Depth: (cm) 0.1 Area: (cm) 0.275 Volume: (cm) 0.027 % Reduction in Area: -16.5% % Reduction in Volume: -12.5% Epithelialization: None Wound Description Classification: Partial Thickness F Wound Margin: Flat and Intact S Exudate Amount: Large Exudate Type: Serous Exudate  Color: amber oul Odor After Cleansing: No lough/Fibrino No Wound Bed Granulation Amount: None Present (0%) Exposed Structure Necrotic Amount: None Present (0%) Fascia Exposed: No Fat Layer (Subcutaneous Tissue) Exposed: No Tendon Exposed: No Muscle Exposed:  No Joint Exposed: No Bone Exposed: No Limited to Skin Breakdown Treatment Notes Wound #5 (Left, Proximal Lower Leg) Notes silver cell, abd, 4 layer Electronic Signature(s) [place], [place] L. ([id number]) Signed: 11/12/2019 11:44:51 AM By: Army Melia Entered By: Army Melia on 11/12/2019 11:44:51 [place], [place] ([id number]) -------------------------------------------------------------------------------- [place] Details Patient Name: Martha Downs L. Date of Service: 11/12/2019 11:00 AM Medical Record Number: [id number] Patient Account Number: [id number] Date of Birth/Sex: 05-12-1956 (64 y.o. F) Treating RN: Army Melia Primary Care Jaslyne Beeck: Neta Ehlers Other Clinician: Referring Melesa Lecy: Neta Ehlers Treating Doylene Splinter/Extender: Melburn Hake, HOYT Weeks in Treatment: 11 Vital Signs Time Taken: 11:33 Temperature (F): 97.8 Height (in): 62 Pulse (bpm): 98 Weight (lbs): 290 Respiratory Rate (breaths/min): 16 Body Mass Index (BMI): 53 Blood Pressure (mmHg): 156/66 Reference Range: 80 - 120 mg / dl Electronic Signature(s) Signed: 11/15/2019 8:02:25 AM By: Army Melia Entered By: Army Melia on 11/12/2019 11:37:15

## 2019-11-12 NOTE — Progress Notes (Signed)
[name] ([id number]) Visit Report for 10/29/2019 Arrival Information Details Patient Name: [name], [name]. Date of Service: 10/29/2019 1:30 PM Medical Record Number: [id number] Patient Account Number: [id number] Date of Birth/Sex: 10-01-1955 (64 y.o. F) Treating RN: Montey Hora Primary Care Maysen Bonsignore: Neta Ehlers Other Clinician: Referring Evangelyn Crouse: Neta Ehlers Treating Jonika Critz/Extender: Melburn Hake, HOYT Weeks in Treatment: 9 Visit Information History Since Last Visit Added or deleted any medications: No Patient Arrived: Ambulatory Any new allergies or adverse reactions: No Arrival Time: 13:55 Had a fall or experienced change in No Accompanied By: husband activities of daily living that may affect Transfer Assistance: None risk of falls: Patient Identification Verified: Yes Signs or symptoms of abuse/neglect since last visito No Secondary Verification Process Completed: Yes Hospitalized since last visit: No Patient Has Alerts: Yes Implantable device outside of the clinic excluding No Patient Alerts: Patient on Blood Thinner cellular tissue based products placed in the center warfarin since last visit: Not Diabetic Has Dressing in Place as Prescribed: No 04/29/2019 [facility] Has Compression in Place as Prescribed: No ABI R)1.17 L)1.24 TBI R) 1.12 L) 0.96 Pain Present Now: Yes Electronic Signature(s) Signed: 10/29/2019 4:27:22 PM By: Lorine Bears RCP, RRT, CHT Entered By: Lorine Bears on 10/29/2019 13:56:04 [place], [place] ([id number]) -------------------------------------------------------------------------------- Compression Therapy Details Patient Name: Martha Downs L. Date of Service: 10/29/2019 1:30 PM Medical Record Number: [id number] Patient Account Number: [id number] Date of Birth/Sex: [birth year] (64 y.o. F) Treating RN: Montey Hora Primary Care Madix Blowe: Neta Ehlers Other Clinician: Referring Hamad Whyte: Neta Ehlers Treating Vernice Mannina/Extender: Melburn Hake, HOYT Weeks in Treatment: 9 Compression Therapy Performed for Wound Assessment: Wound #1 Left,Medial,Anterior Lower Leg Performed By: Clinician Montey Hora, RN Compression Type: Four Layer Pre Treatment ABI: 1.2 Post Procedure Diagnosis Same as Pre-procedure Electronic Signature(s) Signed: 10/29/2019 4:25:46 PM By: Montey Hora Entered By: Montey Hora on 10/29/2019 14:33:50 Wojahn, [name] ([id number]) -------------------------------------------------------------------------------- Compression Therapy Details Patient Name: Martha Downs L. Date of Service: 10/29/2019 1:30 PM Medical Record Number: [id number] Patient Account Number: [id number] Date of Birth/Sex: [birth year] (64 y.o. F) Treating RN: Montey Hora Primary Care Gillis Boardley: Neta Ehlers Other Clinician: Referring Cambryn Charters: Neta Ehlers Treating Koreena Joost/Extender: Melburn Hake, HOYT Weeks in Treatment: 9 Compression Therapy Performed for Wound Assessment: Wound #2 Right,Lateral Lower Leg Performed By: Clinician Montey Hora, RN Compression Type: Four Layer Pre Treatment ABI: 1.2 Post Procedure Diagnosis Same as Pre-procedure Electronic Signature(s) Signed: 10/29/2019 4:25:46 PM By: Montey Hora Entered By: Montey Hora on 10/29/2019 14:33:51 Ignasiak, Kayte L. ([id number]) -------------------------------------------------------------------------------- Compression Therapy Details Patient Name: Martha Downs L. Date of Service: 10/29/2019 1:30 PM Medical Record Number: [id number] Patient Account Number: [id number] Date of Birth/Sex: May 31, 1956 (64 y.o. F) Treating RN: Montey Hora Primary Care Sarath Privott: Neta Ehlers Other Clinician: Referring Myles Mallicoat: Neta Ehlers Treating Aslee Such/Extender: Melburn Hake, HOYT Weeks in Treatment: 9 Compression Therapy Performed for Wound Assessment: Wound #3 Left,Lateral Lower Leg Performed By: Clinician Montey Hora,  RN Compression Type: Four Layer Pre Treatment ABI: 1.2 Post Procedure Diagnosis Same as Pre-procedure Electronic Signature(s) Signed: 10/29/2019 4:25:46 PM By: Montey Hora Entered By: Montey Hora on 10/29/2019 14:33:51 Bohannon, Delma L. ([id number]) -------------------------------------------------------------------------------- Compression Therapy Details Patient Name: Martha Downs L. Date of Service: 10/29/2019 1:30 PM Medical Record Number: [id number] Patient Account Number: [id number] Date of Birth/Sex: 1956/01/16 (64 y.o. F) Treating RN: Montey Hora Primary Care Kao Conry: Neta Ehlers Other Clinician: Referring Toshiro Hanken: Neta Ehlers Treating Lincoln Kleiner/Extender: Melburn Hake, HOYT Weeks in Treatment: 9 Compression  Therapy Performed for Wound Assessment: Wound #4 Right,Posterior Lower Leg Performed By: Clinician Montey Hora, RN Compression Type: Four Layer Pre Treatment ABI: 1.2 Post Procedure Diagnosis Same as Pre-procedure Electronic Signature(s) Signed: 10/29/2019 4:25:46 PM By: Montey Hora Entered By: Montey Hora on 10/29/2019 14:33:51 Dziuba, Evalynne L. ([id number]) -------------------------------------------------------------------------------- Compression Therapy Details Patient Name: Martha Downs L. Date of Service: 10/29/2019 1:30 PM Medical Record Number: [id number] Patient Account Number: [id number] Date of Birth/Sex: 1956-06-04 (64 y.o. F) Treating RN: Montey Hora Primary Care Preciliano Castell: Neta Ehlers Other Clinician: Referring Deshanna Kama: Neta Ehlers Treating Kindsey Eblin/Extender: Melburn Hake, HOYT Weeks in Treatment: 9 Compression Therapy Performed for Wound Assessment: Wound #5 Left,Proximal Lower Leg Performed By: Clinician Montey Hora, RN Compression Type: Four Layer Pre Treatment ABI: 1.2 Post Procedure Diagnosis Same as Pre-procedure Electronic Signature(s) Signed: 10/29/2019 4:25:46 PM By: Montey Hora Entered By: Montey Hora  on 10/29/2019 14:33:52 Komorowski, [name] ([id number]) -------------------------------------------------------------------------------- Encounter Discharge Information Details Patient Name: Martha Downs L. Date of Service: 10/29/2019 1:30 PM Medical Record Number: [id number] Patient Account Number: [id number] Date of Birth/Sex: March 15, 1956 (64 y.o. F) Treating RN: Montey Hora Primary Care Danell Verno: Neta Ehlers Other Clinician: Referring Omaya Nieland: Neta Ehlers Treating Mylinh Cragg/Extender: Melburn Hake, HOYT Weeks in Treatment: 9 Encounter Discharge Information Items Discharge Condition: Stable Ambulatory Status: Ambulatory Discharge Destination: Home Transportation: Private Auto Accompanied By: husband Schedule Follow-up Appointment: Yes Clinical Summary of Care: Electronic Signature(s) Signed: 10/29/2019 4:25:46 PM By: Montey Hora Entered By: Montey Hora on 10/29/2019 14:37:27 Tagliaferro, [name] ([id number]) -------------------------------------------------------------------------------- Lower Extremity Assessment Details Patient Name: Martha Downs L. Date of Service: 10/29/2019 1:30 PM Medical Record Number: [id number] Patient Account Number: [id number] Date of Birth/Sex: 24-Nov-1955 (64 y.o. F) Treating RN: Cornell Barman Primary Care Geniene List: Neta Ehlers Other Clinician: Referring Evaluna Utke: Neta Ehlers Treating Corretta Munce/Extender: Melburn Hake, HOYT Weeks in Treatment: 9 Edema Assessment Assessed: [Left: No] [Right: No] [Left: Edema] [Right: :] Calf Left: Right: Point of Measurement: 32 cm From Medial Instep 65 cm 54 cm Ankle Left: Right: Point of Measurement: 10 cm From Medial Instep 36.4 cm 32.5 cm Vascular Assessment Pulses: Dorsalis Pedis Palpable: [Left:Yes] [Right:Yes] Electronic Signature(s) Signed: 11/12/2019 5:40:44 PM By: Gretta Cool, BSN, RN, CWS, Kim RN, BSN Entered By: Gretta Cool, BSN, RN, CWS, Kim on 10/29/2019 14:16:06 Dobrowolski, [name]  ([id number]) -------------------------------------------------------------------------------- Multi Wound Chart Details Patient Name: [name]. Date of Service: 10/29/2019 1:30 PM Medical Record Number: [id number] Patient Account Number: [id number] Date of Birth/Sex: [birth year] (64 y.o. F) Treating RN: Montey Hora Primary Care Alanni Vader: Neta Ehlers Other Clinician: Referring Sunnie Odden: Neta Ehlers Treating Vester Balthazor/Extender: Melburn Hake, HOYT Weeks in Treatment: 9 Vital Signs Height(in): 70 Pulse(bpm): 91 Weight(lbs): 290 Blood Pressure(mmHg): 157/62 Body Mass Index(BMI): 53 Temperature(F): 97.8 Respiratory Rate(breaths/min): 18 Photos: Wound Location: Left Lower Leg - Medial, Anterior Right Lower Leg - Lateral Left Lower Leg - Lateral Wounding Event: Gradually Appeared Gradually Appeared Gradually Appeared Primary Etiology: Lymphedema Lymphedema Lymphedema Comorbid History: Anemia, Coronary Artery Disease, Anemia, Coronary Artery Disease, Anemia, Coronary Artery Disease, Hypertension Hypertension Hypertension Date Acquired: 08/04/2019 08/04/2019 10/22/2019 Weeks of Treatment: 9 9 0 Wound Status: Open Open Open Measurements L x W x D (cm) 1x3x0.1 1x0.6x0.1 4.5x6x0.2 Area (cm) : 2.356 0.471 21.206 Volume (cm) : 0.236 0.047 4.241 % Reduction in Area: 99.80% 99.80% 0.00% % Reduction in Volume: 99.80% 99.80% 0.00% Classification: Partial Thickness Partial Thickness Partial Thickness Exudate Amount: Large Medium Large Exudate Type: Serous Serous Serous Exudate Color: amber amber amber Wound Margin: Indistinct,  nonvisible Flat and Intact Flat and Intact Granulation Amount: Large (67-100%) None Present (0%) Small (1-33%) Granulation Quality: Red N/A Red Necrotic Amount: None Present (0%) Large (67-100%) Large (67-100%) Necrotic Tissue: N/A Eschar Adherent Slough Exposed Structures: Fat Layer (Subcutaneous Tissue) Fat Layer (Subcutaneous Tissue) Fat Layer  (Subcutaneous Tissue) Exposed: Yes Exposed: Yes Exposed: Yes Fascia: No Fascia: No Fascia: No Tendon: No Tendon: No Tendon: No Muscle: No Muscle: No Muscle: No Joint: No Joint: No Joint: No Bone: No Bone: No Bone: No Epithelialization: Large (67-100%) None Small (1-33%) Wound Number: 4 5 N/A Photos: N/A LENNOX, KANTOR ([id number]) Wound Location: Right Lower Leg - Posterior Left Lower Leg - Proximal N/A Wounding Event: Gradually Appeared Gradually Appeared N/A Primary Etiology: Lymphedema Lymphedema N/A Comorbid History: Anemia, Coronary Artery Disease, Anemia, Coronary Artery Disease, N/A Hypertension Hypertension Date Acquired: 10/21/2019 10/21/2019 N/A Weeks of Treatment: 0 0 N/A Wound Status: Open Open N/A Measurements L x W x D (cm) 0.6x0.5x0.1 0.6x0.5x0.1 N/A Area (cm) : 0.236 0.236 N/A Volume (cm) : 0.024 0.024 N/A % Reduction in Area: 0.00% 0.00% N/A % Reduction in Volume: 0.00% 0.00% N/A Classification: Partial Thickness Partial Thickness N/A Exudate Amount: Medium Large N/A Exudate Type: Serous Serous N/A Exudate Color: amber amber N/A Wound Margin: Flat and Intact Flat and Intact N/A Granulation Amount: None Present (0%) None Present (0%) N/A Granulation Quality: N/A N/A N/A Necrotic Amount: Large (67-100%) None Present (0%) N/A Necrotic Tissue: Eschar, Adherent Slough N/A N/A Exposed Structures: Fascia: No Fascia: No N/A Fat Layer (Subcutaneous Tissue) Fat Layer (Subcutaneous Tissue) Exposed: No Exposed: No Tendon: No Tendon: No Muscle: No Muscle: No Joint: No Joint: No Bone: No Bone: No Limited to Skin Breakdown Epithelialization: None None N/A Treatment Notes Electronic Signature(s) Signed: 10/29/2019 4:25:46 PM By: Montey Hora Entered By: Montey Hora on 10/29/2019 14:28:30 Coval, [name] ([id number]) -------------------------------------------------------------------------------- [place] Details Patient  Name: [name]. Date of Service: 10/29/2019 1:30 PM Medical Record Number: [id number] Patient Account Number: [id number] Date of Birth/Sex: 1956-06-11 (64 y.o. F) Treating RN: Montey Hora Primary Care Tiwan Schnitker: Neta Ehlers Other Clinician: Referring Camdon Saetern: Neta Ehlers Treating Lisa-Marie Rueger/Extender: Melburn Hake, HOYT Weeks in Treatment: 9 Active Inactive Abuse / Safety / Falls / Self Care Management Nursing Diagnoses: Potential for falls Goals: Patient will not experience any injury related to falls Date Initiated: 08/27/2019 Target Resolution Date: 11/20/2019 Goal Status: Active Interventions: Assess fall risk on admission and as needed Notes: Orientation to the Wound Care Program Nursing Diagnoses: Knowledge deficit related to the wound healing center program Goals: Patient/caregiver will verbalize understanding of the [facility] Date Initiated: 08/27/2019 Target Resolution Date: 11/20/2019 Goal Status: Active Interventions: Provide education on orientation to the wound center Notes: Venous Leg Ulcer Nursing Diagnoses: Potential for venous Insuffiency (use before diagnosis confirmed) Goals: Patient will maintain optimal edema control Date Initiated: 08/27/2019 Target Resolution Date: 11/20/2019 Goal Status: Active Interventions: Assess peripheral edema status every visit. Notes: Wound/Skin Impairment Nursing Diagnoses: Impaired tissue integrity Waldman, [place] ([id number]) Goals: Ulcer/skin breakdown will heal within 14 weeks Date Initiated: 08/27/2019 Target Resolution Date: 11/20/2019 Goal Status: Active Interventions: Assess patient/caregiver ability to obtain necessary supplies Assess patient/caregiver ability to perform ulcer/skin care regimen upon admission and as needed Assess ulceration(s) every visit Notes: Electronic Signature(s) Signed: 10/29/2019 4:25:46 PM By: Montey Hora Entered By: Montey Hora on 10/29/2019  14:28:21 Nguyen, Jasa L. ([id number]) -------------------------------------------------------------------------------- Pain Assessment Details Patient Name: Martha Downs L. Date of Service: 10/29/2019 1:30 PM Medical  Record Number: [id number] Patient Account Number: [id number] Date of Birth/Sex: May 15, 1956 (64 y.o. F) Treating RN: Cornell Barman Primary Care Rahel Carlton: Neta Ehlers Other Clinician: Referring Jhan Conery: Neta Ehlers Treating Aijah Lattner/Extender: Melburn Hake, HOYT Weeks in Treatment: 9 Active Problems Location of Pain Severity and Description of Pain Patient Has Paino Yes Site Locations Pain Location: Pain in Ulcers Rate the pain. Current Pain Level: 2 Pain Management and Medication Current Pain Management: Electronic Signature(s) Signed: 11/12/2019 5:40:44 PM By: Gretta Cool, BSN, RN, CWS, Kim RN, BSN Entered By: Gretta Cool, BSN, RN, CWS, Kim on 10/29/2019 13:58:30 Maradiaga, [name] ([id number]) -------------------------------------------------------------------------------- Patient/Caregiver Education Details Patient Name: [name]. Date of Service: 10/29/2019 1:30 PM Medical Record Number: [id number] Patient Account Number: [id number] Date of Birth/Gender: 01/31/1956 (64 y.o. F) Treating RN: Montey Hora Primary Care Physician: Neta Ehlers Other Clinician: Referring Physician: Neta Ehlers Treating Physician/Extender: Sharalyn Ink in Treatment: 9 Education Assessment Education Provided To: Patient and Caregiver Education Topics Provided Venous: Handouts: Other: leg elevation Methods: Explain/Verbal Responses: State content correctly Electronic Signature(s) Signed: 10/29/2019 4:25:46 PM By: Montey Hora Entered By: Montey Hora on 10/29/2019 14:36:39 [place], [place] L. ([id number]) -------------------------------------------------------------------------------- Wound Assessment Details Patient Name: Martha Downs L. Date of Service: 10/29/2019  1:30 PM Medical Record Number: [id number] Patient Account Number: [id number] Date of Birth/Sex: 1956-06-23 (64 y.o. F) Treating RN: Cornell Barman Primary Care Raja Caputi: Neta Ehlers Other Clinician: Referring Kamee Bobst: Neta Ehlers Treating Manjit Bufano/Extender: Melburn Hake, HOYT Weeks in Treatment: 9 Wound Status Wound Number: 1 Primary Etiology: Lymphedema Wound Location: Left Lower Leg - Medial, Anterior Wound Status: Open Wounding Event: Gradually Appeared Comorbid History: Anemia, Coronary Artery Disease, Hypertension Date Acquired: 08/04/2019 Weeks Of Treatment: 9 Clustered Wound: No Photos Wound Measurements Length: (cm) 1 Width: (cm) 3 Depth: (cm) 0.1 Area: (cm) 2.356 Volume: (cm) 0.236 % Reduction in Area: 99.8% % Reduction in Volume: 99.8% Epithelialization: Large (67-100%) Tunneling: No Undermining: No Wound Description Classification: Partial Thickness Wound Margin: Indistinct, nonvisible Exudate Amount: Large Exudate Type: Serous Exudate Color: amber Wound Bed Granulation Amount: Large (67-100%) Exposed Structure Granulation Quality: Red Fascia Exposed: No Necrotic Amount: None Present (0%) Fat Layer (Subcutaneous Tissue) Exposed: Yes Tendon Exposed: No Muscle Exposed: No Joint Exposed: No Bone Exposed: No Electronic Signature(s) Signed: 11/12/2019 5:40:44 PM By: Gretta Cool, BSN, RN, CWS, Kim RN, BSN Entered By: Gretta Cool, BSN, RN, CWS, Kim on 10/29/2019 14:08:13 [place], [name] ([id number]) -------------------------------------------------------------------------------- Wound Assessment Details Patient Name: Martha Downs L. Date of Service: 10/29/2019 1:30 PM Medical Record Number: [id number] Patient Account Number: [id number] Date of Birth/Sex: 11/30/1955 (64 y.o. F) Treating RN: Cornell Barman Primary Care Nikia Levels: Neta Ehlers Other Clinician: Referring Sherida Dobkins: Neta Ehlers Treating Hannia Matchett/Extender: Melburn Hake, HOYT Weeks in Treatment: 9 Wound  Status Wound Number: 2 Primary Etiology: Lymphedema Wound Location: Right Lower Leg - Lateral Wound Status: Open Wounding Event: Gradually Appeared Comorbid History: Anemia, Coronary Artery Disease, Hypertension Date Acquired: 08/04/2019 Weeks Of Treatment: 9 Clustered Wound: No Photos Wound Measurements Length: (cm) 1 Width: (cm) 0.6 Depth: (cm) 0.1 Area: (cm) 0.471 Volume: (cm) 0.047 % Reduction in Area: 99.8% % Reduction in Volume: 99.8% Epithelialization: None Tunneling: No Undermining: No Wound Description Classification: Partial Thickness Wound Margin: Flat and Intact Exudate Amount: Medium Exudate Type: Serous Exudate Color: amber Foul Odor After Cleansing: No Slough/Fibrino Yes Wound Bed Granulation Amount: None Present (0%) Exposed Structure Necrotic Amount: Large (67-100%) Fascia Exposed: No Necrotic Quality: Eschar Fat Layer (Subcutaneous Tissue) Exposed: Yes Tendon Exposed: No  Muscle Exposed: No Joint Exposed: No Bone Exposed: No Electronic Signature(s) Signed: 11/12/2019 5:40:44 PM By: Gretta Cool, BSN, RN, CWS, Kim RN, BSN Entered By: Gretta Cool, BSN, RN, CWS, Kim on 10/29/2019 14:09:06 Barrett, [name] ([id number]) -------------------------------------------------------------------------------- Wound Assessment Details Patient Name: AMELY, BREITBARTH L. Date of Service: 10/29/2019 1:30 PM Medical Record Number: [id number] Patient Account Number: [id number] Date of Birth/Sex: Oct 23, 1955 (64 y.o. F) Treating RN: Cornell Barman Primary Care Cidney Kirkwood: Neta Ehlers Other Clinician: Referring Kellen Hover: Neta Ehlers Treating Ottie Tillery/Extender: Melburn Hake, HOYT Weeks in Treatment: 9 Wound Status Wound Number: 3 Primary Etiology: Lymphedema Wound Location: Left Lower Leg - Lateral Wound Status: Open Wounding Event: Gradually Appeared Comorbid History: Anemia, Coronary Artery Disease, Hypertension Date Acquired: 10/22/2019 Weeks Of Treatment: 0 Clustered Wound:  No Photos Wound Measurements Length: (cm) 4.5 Width: (cm) 6 Depth: (cm) 0.2 Area: (cm) 21.206 Volume: (cm) 4.241 % Reduction in Area: 0% % Reduction in Volume: 0% Epithelialization: Small (1-33%) Tunneling: No Undermining: No Wound Description Classification: Partial Thickness Wound Margin: Flat and Intact Exudate Amount: Large Exudate Type: Serous Exudate Color: amber Foul Odor After Cleansing: No Slough/Fibrino Yes Wound Bed Granulation Amount: Small (1-33%) Exposed Structure Granulation Quality: Red Fascia Exposed: No Necrotic Amount: Large (67-100%) Fat Layer (Subcutaneous Tissue) Exposed: Yes Necrotic Quality: Adherent Slough Tendon Exposed: No Muscle Exposed: No Joint Exposed: No Bone Exposed: No Electronic Signature(s) Signed: 11/12/2019 5:40:44 PM By: Gretta Cool, BSN, RN, CWS, Kim RN, BSN Entered By: Gretta Cool, BSN, RN, CWS, Kim on 10/29/2019 14:10:31 Schaafsma, [name] ([id number]) -------------------------------------------------------------------------------- Wound Assessment Details Patient Name: Martha Downs L. Date of Service: 10/29/2019 1:30 PM Medical Record Number: [id number] Patient Account Number: [id number] Date of Birth/Sex: [birth year] (64 y.o. F) Treating RN: Cornell Barman Primary Care Lashina Milles: Neta Ehlers Other Clinician: Referring Wanetta Funderburke: Neta Ehlers Treating Cosmo Tetreault/Extender: Melburn Hake, HOYT Weeks in Treatment: 9 Wound Status Wound Number: 4 Primary Etiology: Lymphedema Wound Location: Right Lower Leg - Posterior Wound Status: Open Wounding Event: Gradually Appeared Comorbid History: Anemia, Coronary Artery Disease, Hypertension Date Acquired: 10/21/2019 Weeks Of Treatment: 0 Clustered Wound: No Photos Wound Measurements Length: (cm) 0.6 Width: (cm) 0.5 Depth: (cm) 0.1 Area: (cm) 0.236 Volume: (cm) 0.024 % Reduction in Area: 0% % Reduction in Volume: 0% Epithelialization: None Tunneling: No Undermining: No Wound  Description Classification: Partial Thickness Wound Margin: Flat and Intact Exudate Amount: Medium Exudate Type: Serous Exudate Color: amber Foul Odor After Cleansing: No Slough/Fibrino Yes Wound Bed Granulation Amount: None Present (0%) Exposed Structure Necrotic Amount: Large (67-100%) Fascia Exposed: No Necrotic Quality: Eschar, Adherent Slough Fat Layer (Subcutaneous Tissue) Exposed: No Tendon Exposed: No Muscle Exposed: No Joint Exposed: No Bone Exposed: No Electronic Signature(s) Signed: 11/12/2019 5:40:44 PM By: Gretta Cool, BSN, RN, CWS, Kim RN, BSN Entered By: Gretta Cool, BSN, RN, CWS, Kim on 10/29/2019 14:11:35 [place], [name] ([id number]) -------------------------------------------------------------------------------- Wound Assessment Details Patient Name: Martha Downs L. Date of Service: 10/29/2019 1:30 PM Medical Record Number: [id number] Patient Account Number: [id number] Date of Birth/Sex: Sep 19, 1955 (64 y.o. F) Treating RN: Cornell Barman Primary Care Jalani Rominger: Neta Ehlers Other Clinician: Referring Winslow Ederer: Neta Ehlers Treating Jeanett Antonopoulos/Extender: Melburn Hake, HOYT Weeks in Treatment: 9 Wound Status Wound Number: 5 Primary Etiology: Lymphedema Wound Location: Left Lower Leg - Proximal Wound Status: Open Wounding Event: Gradually Appeared Comorbid History: Anemia, Coronary Artery Disease, Hypertension Date Acquired: 10/21/2019 Weeks Of Treatment: 0 Clustered Wound: No Photos Wound Measurements Length: (cm) 0.6 Width: (cm) 0.5 Depth: (cm) 0.1 Area: (cm) 0.236 Volume: (cm) 0.024 % Reduction  in Area: 0% % Reduction in Volume: 0% Epithelialization: None Tunneling: No Undermining: No Wound Description Classification: Partial Thickness Wound Margin: Flat and Intact Exudate Amount: Large Exudate Type: Serous Exudate Color: amber Foul Odor After Cleansing: No Slough/Fibrino No Wound Bed Granulation Amount: None Present (0%) Exposed Structure Necrotic  Amount: None Present (0%) Fascia Exposed: No Fat Layer (Subcutaneous Tissue) Exposed: No Tendon Exposed: No Muscle Exposed: No Joint Exposed: No Bone Exposed: No Limited to Skin Breakdown Electronic Signature(s) Signed: 11/12/2019 5:40:44 PM By: Gretta Cool, BSN, RN, CWS, Kim RN, BSN Entered By: Gretta Cool, BSN, RN, CWS, Kim on 10/29/2019 14:12:31 Soham, [name] ([id number]) -------------------------------------------------------------------------------- Vitals Details Patient Name: Martha Downs L. Date of Service: 10/29/2019 1:30 PM Medical Record Number: [id number] Patient Account Number: [id number] Date of Birth/Sex: 30-Jan-1956 (64 y.o. F) Treating RN: Montey Hora Primary Care Jevin Camino: Neta Ehlers Other Clinician: Referring Marki Frede: Neta Ehlers Treating Colyn Miron/Extender: Melburn Hake, HOYT Weeks in Treatment: 9 Vital Signs Time Taken: 13:56 Temperature (F): 97.8 Height (in): 62 Pulse (bpm): 91 Weight (lbs): 290 Respiratory Rate (breaths/min): 18 Body Mass Index (BMI): 53 Blood Pressure (mmHg): 157/62 Reference Range: 80 - 120 mg / dl Electronic Signature(s) Signed: 10/29/2019 4:27:22 PM By: Lorine Bears RCP, RRT, CHT Entered By: Lorine Bears on 10/29/2019 13:57:13

## 2019-11-12 NOTE — Progress Notes (Addendum)
AMAURIE, CHAMBERLAND ([id number]) Visit Report for 11/12/2019 Chief Complaint Document Details Patient Name: [name], SULLINGER. Date of Service: 11/12/2019 11:00 AM Medical Record Number: [id number] Patient Account Number: [id number] Date of Birth/Sex: 1956-08-21 (64 y.o. F) Treating RN: Montey Hora Primary Care Provider: Neta Ehlers Other Clinician: Referring Provider: Neta Ehlers Treating Provider/Extender: Melburn Hake, Leen Tworek Weeks in Treatment: 11 Information Obtained from: Patient Chief Complaint Bilateral LE Lymphedema and ulcers Electronic Signature(s) Signed: 11/12/2019 11:29:34 AM By: Worthy Keeler PA-C Entered By: Worthy Keeler on 11/12/2019 11:29:33 [place], [place] ([id number]) -------------------------------------------------------------------------------- HPI Details Patient Name: [name] L. Date of Service: 11/12/2019 11:00 AM Medical Record Number: [id number] Patient Account Number: [id number] Date of Birth/Sex: [birth year] (64 y.o. F) Treating RN: Montey Hora Primary Care Provider: Neta Ehlers Other Clinician: Referring Provider: Neta Ehlers Treating Provider/Extender: Melburn Hake, Yahshua Thibault Weeks in Treatment: 11 History of Present Illness HPI Description: 08/27/2019 patient presents today for initial evaluation here in our clinic concerning issues that she has been having with her bilateral lower extremities with lymphedema and weeping. She is previously been a patient over the past year of Dr. Jerilee Hoh at the wound care center in [facility]. With that being said she subsequently was recommended by the lymphedema clinic here in [place] to follow-up with [place] here instead of going back to [place] since they were in closer contact with the clinic here than they are obviously. With that being said the patient states that is why she came her way in order to be evaluated at this point. Fortunately there is no signs of active infection at this time.  No fevers, chills, nausea, vomiting, or diarrhea. The patient does have a history of lymphedema, hypertension, long-term use of anticoagulant therapy, and obstructive sleep apnea. During the visit today and from checking into the discharge she was prone to unexpectedly falling asleep. I did discuss this with her daughter as well who states that this is very common for her she states she is not really sure how compliant her mother is with her CPAP machine that may be part of the issue. The patient did have ABIs performed which revealed have copies of his well today that was on 05/01/2019 and it showed a right ABI of 1.17 with a TBI of 1.12 and a left ABI of 1.24 with a TBI of 0.96. She has had 2 transfusions in the past 2 weeks apparently as well. 08/31/2019 upon evaluation today patient actually appears to be doing somewhat better in regard to her lower extremity ulcers. She still has a tremendous amount of drainage from the left lower extremity unfortunately but her right lower extremity is doing much better which is great news. Overall I am very pleased in this regard. With regard to home health we finally got things settled so home health will be coming out to see her this would be very beneficial in my opinion as I think they will be able to help her tremendously with more frequent dressing changes. 12/28-Patient is back here at 1 week visit, with regard to her bilateral lower extremity ulcers, she has a significant degree of lymphedema, significant degree of drainage more from the left and which continues to be an issue, patient had not been using her lymphedema pumps as recommended, she is concerned about amount of drainage and how quickly it soaks the dressing, she is concerned about the lymphedema pump sticking to her skin and was told to use it on the wraps.  She will get in touch with her cardiologist about adjusting her Lasix dose. 09/14/2019 on evaluation today patient appears to be doing  poorly in regard to her lower extremities especially left lower extremity. Unfortunately she is continue to have a significant amount of drainage which is preventing her from being able to heal keeping the wounds very macerated. She also has some warmth to touch in the anterior to medial portion of the left lower extremity compared to the remainder of the wound which does have made somewhat concerned about the possibility of cellulitis. Obviously it can be difficult to determine just strictly lymphedema versus cellulitis but right now I am somewhat concerned. She tells me that she can put a wrap on and at least has to change this twice a day or else she will be walking around leaving puddles of water behind her. Obviously this is fairly significant. She did see her primary care provider they did not see anything going on that they felt needed to be addressed from a diuretic standpoint but nonetheless I am still somewhat concerned based on what we are seeing to be honest. The patient and her husband are likewise concerned. 09/17/2019 patient is seen today in follow-up I last saw her on Tuesday at that point based on the severity of her lower extremity edema, weeping, and erythema I did send her to the ER for further evaluation and treatment. She subsequently did go for that evaluation. It did appear that based on my review of the notes this is dated 09/14/2019 that the patient did have a BNP of 149.4. Which was notable. Subsequently she was given a dose of IV vancomycin as well as doxycycline and Keflex. It appears however that she was discharged on oral clindamycin 150 mg every 6 hours and to be honest compared to what I saw on Tuesday she actually seems to be doing somewhat better. Fortunately there is no evidence of active infection at this time which is good news. In the interim she did yesterday see her cardiologist as well Dr. Bettina Gavia. That was on 09/16/2019. Upon review of this note it does appear that  his recommendation at this point was to increase the furosemide to 40 mg 2 times a day. He also recommend an increase of potassium 10 mg taken 2 times a day. His other recommendation was that the patient needs to have a follow-up with her hematology oncologist in order to see if they can do anything about iron transfusions for her as unfortunately her iron is still quite low and obviously oral supplementation is not going to be the best way to get this up. 09/24/2019 upon evaluation today patient appears to be doing quite well with regard to her lower extremities. The right lower extremity in particular has dramatically improved to be honest so has the left in my opinion. There does not appear to be any signs of active infection which is good news. No fevers, chills, nausea, vomiting, or diarrhea. Patient is still taking the clindamycin at this time. 10/01/2019 on evaluation today patient appears to be showing some signs of improvement at this point in regard to her lower extremities. The right lower extremity is doing quite well the left lower extremity is still draining but again I do believe this is significantly better compared to what it was in the past. Fortunately there is no evidence of active infection at this time. No fever chills noted 10/08/2019 upon evaluation today patient appears to have smaller areas of erythema and  weeping on the left lower extremity as compared to last week's evaluation. The right leg is doing excellent. With that being said unfortunately she is still having a lot of significant swelling on the left in fact the swelling is worse today than it was last week. With that being said she is telling me that she is able to keep her wraps on now for at least the day sometimes even up to 30 hours before they are having to be changed. When they do have to change it they are using her lymphedema wraps at home to reapply what they can. She only changes that she tells me when it  actually gets to the point that is draining so much that it leaks onto her floor. 10/15/2019 upon evaluation today patient appears to actually be doing very well with regard to her lower extremities in fact I believe the right lower extremity may be completely sealed up that we will likely watch it 1 more week. The left lower extremity is doing better she does not seem to be having as much weeping and leaking but still this is going on. Home health still has not gotten the [place] there is still just using ABD pads I believe was able [place], Lorrine L. ([id number]) to get Lauraine Rinne this would actually help out. 10/21/2019 upon evaluation today patient appears to be doing much better in regard to her lower extremities bilaterally. The right lower extremity has 1 area remaining that still draining the left lower extremity is still draining but not nearly what it was in the past. Overall I am extremely pleased with how things seem to be progressing. 10/29/2019 upon evaluation today patient actually appears to be doing quite well in regard to her lower extremities bilaterally both extremities show signs of improvement. The biggest issue we seem to be having is that unfortunately the home health nurse does not seem to be wrapping this quite right in that it seems to slide down within just a few hours of them coming out to rewrap her according to the patient and her husband. The wraps we put on the state last much longer in fact generally until they are taken off. Fortunately there is no signs of infection although unfortunately she does still have a few open areas on both lower extremities at this time. 11/12/2019 upon evaluation today patient appears to be doing excellent in regard to her wounds. She does not have weeping like she is had in the past and overall I feel like she is doing quite well. With that being said this is something that has continued to give her trouble and that she states that the wrap is  sliding down especially when home health puts this on. She tells me that the ones we put on do not seem to be sliding down at this point. Nonetheless I think that this is definitely something of concern. Obviously if it slides down can cause trouble with getting the area to effectively heal and continue to do well. Electronic Signature(s) Signed: 11/12/2019 1:47:16 PM By: Worthy Keeler PA-C Entered By: Worthy Keeler on 11/12/2019 13:47:11 Schiro, [name] ([id number]) -------------------------------------------------------------------------------- Physical Exam Details Patient Name: [name], MAHN L. Date of Service: 11/12/2019 11:00 AM Medical Record Number: [id number] Patient Account Number: [id number] Date of Birth/Sex: [birth year] (64 y.o. F) Treating RN: Montey Hora Primary Care Provider: Neta Ehlers Other Clinician: Referring Provider: Neta Ehlers Treating Provider/Extender: Melburn Hake, Brizza Nathanson Weeks in Treatment: 18 Constitutional Well-nourished and well-hydrated  in no acute distress. Respiratory normal breathing without difficulty. Psychiatric this patient is able to make decisions and demonstrates good insight into disease process. Alert and Oriented x 3. pleasant and cooperative. Notes Patient's wounds currently appear to be very minimal compared to what they have been in the past overall her left lower extremity edema is still quite significant but again her weeping has dramatically improved overall and very pleased in this regard there is no signs of active infection at this time. Electronic Signature(s) Signed: 11/12/2019 2:02:16 PM By: Worthy Keeler PA-C Entered By: Worthy Keeler on 11/12/2019 14:02:15 [place], [name] ([id number]) -------------------------------------------------------------------------------- Physician Orders Details Patient Name: [name]. Date of Service: 11/12/2019 11:00 AM Medical Record Number: [id number] Patient Account Number:  [id number] Date of Birth/Sex: 1956/08/09 (64 y.o. 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 - Do not get your wraps wet Wound #2 Right,Lateral Lower Leg o Cleanse wound with mild soap and water o May shower with protection. - Do not get your wraps wet Wound #3 Left,Lateral Lower Leg o Cleanse wound with mild soap and water o May shower with protection. - Do not get your wraps wet Wound #4 Right,Posterior Lower Leg o Cleanse wound with mild soap and water o May shower with protection. - Do not get your wraps wet Wound #5 Left,Proximal Lower Leg o Cleanse wound with mild soap and water o May shower with protection. - Do not get your wraps wet Skin Barriers/Peri-Wound Care o Triamcinolone Acetonide Ointment (TCA) - to either leg on places that itch - ask patient Primary Wound Dressing Wound #1 Left,Medial,Anterior Lower Leg o Silver Alginate Wound #2 Right,Lateral Lower Leg o Silver Alginate Wound #3 Left,Lateral Lower Leg o Silver Alginate Wound #4 Right,Posterior Lower Leg o Silver Alginate Wound #5 Left,Proximal Lower Leg o Silver Alginate Secondary Dressing Wound #1 Left,Medial,Anterior Lower Leg o ABD pad Wound #2 Right,Lateral Lower Leg [place], Halah L. ([id number]) o ABD pad Wound  #3 Left,Lateral Lower Leg o ABD pad Wound #4 Right,Posterior Lower Leg o ABD pad Wound #5 Left,Proximal Lower Leg o ABD pad Dressing Change Frequency Wound #1 Left,Medial,Anterior Lower Leg o Change Dressing Monday, Wednesday, Friday - Friday in office Wound #2 Right,Lateral Lower Leg o Change Dressing Monday, Wednesday, Friday - Friday in office Wound #3 Left,Lateral Lower Leg o Change Dressing Monday, Wednesday, Friday - Friday in office Wound #4 Right,Posterior Lower Leg o Change Dressing Monday, Wednesday, Friday - Friday in office Wound #5 Left,Proximal Lower Leg o Change Dressing Monday, Wednesday, Friday - Friday in office Follow-up Appointments o Return Appointment in 1 week. Edema Control Wound #1 Left,Medial,Anterior Lower Leg o 4 Layer Compression System - Bilateral - Wrap from toes to 3cm below the knee - please anchor with unna at the top to prevent wraps from sliding down Wound #2 Right,Lateral Lower Leg o 4 Layer Compression System - Bilateral - Wrap from toes to 3cm below the  knee - please anchor with unna at the top to prevent wraps from sliding down Wound #3 Left,Lateral Lower Leg o 4 Layer Compression System - Bilateral - Wrap from toes to 3cm below the knee - please anchor with unna at the top to prevent wraps from sliding down Wound #4 Right,Posterior Lower Leg o 4 Layer Compression System - Bilateral - Wrap from toes to 3cm below the knee - please anchor with unna at the top to prevent wraps from sliding down Wound #5 Left,Proximal Lower Leg o 4 Layer Compression System - Bilateral - Wrap from toes to 3cm below the knee - please anchor with unna at the top to prevent wraps from sliding down [place] #1 Left,Medial,Anterior Lower Leg o [place] Visits - [place] Nurse may visit PRN to address patientos wound care needs. o FACE TO FACE ENCOUNTER: MEDICARE and MEDICAID PATIENTS: I certify  that this patient is under my care and that I had a face-to- face encounter that meets the physician face-to-face encounter requirements with this patient on this date. The encounter with the patient was in whole or in part for the following MEDICAL CONDITION: (primary reason for [place]) MEDICAL NECESSITY: I certify, that based on my findings, NURSING services are a medically necessary home health service. HOME BOUND STATUS: I certify that my clinical findings support that this patient is homebound (i.e., Due to illness or injury, pt requires aid of supportive devices such as crutches, cane, wheelchairs, walkers, the use of special transportation or the assistance of another person to leave their place of residence. There is a normal inability to leave the home and doing so requires considerable and taxing effort. Other absences are for medical reasons / religious services and are infrequent or of short duration when for other reasons). o If current dressing causes regression in wound condition, may D/C ordered dressing product/s and apply Normal Saline Moist Dressing daily until next [place] / Other MD appointment. [place] of regression in wound condition at [phone]. o Please direct any NON-WOUND related issues/requests for orders to patient's Primary Care Physician YARETZIE, NERISON ([id number]) Wound #2 [place] Visits - [place] Nurse may visit PRN to address patientos wound care needs. o FACE TO FACE ENCOUNTER: MEDICARE and MEDICAID PATIENTS: I certify that this patient is under my care and that I had a face-to- face encounter that meets the physician face-to-face encounter requirements with this patient on this date. The encounter with the patient was in whole or in part for the following MEDICAL CONDITION: (primary reason for [place]) MEDICAL NECESSITY: I certify, that based on  my findings, NURSING services are a medically necessary home health service. HOME BOUND STATUS: I certify that my clinical findings support that this patient is homebound (i.e., Due to illness or injury, pt requires aid of supportive devices such as crutches, cane, wheelchairs, walkers, the use of special transportation or the assistance of another person to leave their place of residence. There is a normal inability to leave the home and doing so requires considerable and taxing effort. Other absences are for medical reasons / religious services and are infrequent or of short duration when for other reasons). o If current dressing causes regression in wound condition, may D/C ordered dressing product/s and apply Normal Saline Moist Dressing daily until next [place] / Other MD appointment. [place] of regression in  wound condition at [phone]. o Please direct any NON-WOUND related issues/requests for orders to patient's Primary Care Physician Wound #3 Left,Lateral Lower Leg o [place] Visits - [place] Nurse may visit PRN to address patientos wound care needs. o FACE TO FACE ENCOUNTER: MEDICARE and MEDICAID PATIENTS: I certify that this patient is under my care and that I had a face-to- face encounter that meets the physician face-to-face encounter requirements with this patient on this date. The encounter with the patient was in whole or in part for the following MEDICAL CONDITION: (primary reason for [place]) MEDICAL NECESSITY: I certify, that based on my findings, NURSING services are a medically necessary home health service. HOME BOUND STATUS: I certify that my clinical findings support that this patient is homebound (i.e., Due to illness or injury, pt requires aid of supportive devices such as crutches, cane, wheelchairs, walkers, the use of special transportation or the assistance of another person to leave their  place of residence. There is a normal inability to leave the home and doing so requires considerable and taxing effort. Other absences are for medical reasons / religious services and are infrequent or of short duration when for other reasons). o If current dressing causes regression in wound condition, may D/C ordered dressing product/s and apply Normal Saline Moist Dressing daily until next [place] / Other MD appointment. [place] of regression in wound condition at [phone]. o Please direct any NON-WOUND related issues/requests for orders to patient's Primary Care Physician Wound #4 [place] Visits - [place] Nurse may visit PRN to address patientos wound care needs. o FACE TO FACE ENCOUNTER: MEDICARE and MEDICAID PATIENTS: I certify that this patient is under my care and that I had a face-to- face encounter that meets the physician face-to-face encounter requirements with this patient on this date. The encounter with the patient was in whole or in part for the following MEDICAL CONDITION: (primary reason for [place]) MEDICAL NECESSITY: I certify, that based on my findings, NURSING services are a medically necessary home health service. HOME BOUND STATUS: I certify that my clinical findings support that this patient is homebound (i.e., Due to illness or injury, pt requires aid of supportive devices such as crutches, cane, wheelchairs, walkers, the use of special transportation or the assistance of another person to leave their place of residence. There is a normal inability to leave the home and doing so requires considerable and taxing effort. Other absences are for medical reasons / religious services and are infrequent or of short duration when for other reasons). o If current dressing causes regression in wound condition, may D/C ordered dressing product/s and apply Normal Saline  Moist Dressing daily until next [place] / Other MD appointment. [place] of regression in wound condition at [phone]. o Please direct any NON-WOUND related issues/requests for orders to patient's Primary Care Physician Wound #5 Left,Proximal Lower Leg o [place] Visits - [place] Nurse may visit PRN to address patientos wound care needs. o FACE TO FACE ENCOUNTER: MEDICARE and MEDICAID PATIENTS: I certify that this patient is under my care and that I had a face-to- face encounter that meets the physician face-to-face encounter requirements with this patient on this date. The encounter with the patient was in whole or in part for the following MEDICAL CONDITION: (primary reason for [place]) MEDICAL NECESSITY: I certify, that  based on my findings, NURSING services are a medically necessary home health service. HOME BOUND STATUS: I certify that my clinical findings support that this patient is homebound (i.e., Due to illness or injury, pt requires aid of supportive devices such as crutches, cane, wheelchairs, walkers, the use of special transportation or the assistance of another person to leave their place of residence. There is a normal inability to leave the home and doing so requires considerable and taxing effort. Other absences are for medical reasons / religious services and are infrequent or of short duration when for other reasons). o If current dressing causes regression in wound condition, may D/C ordered dressing product/s and apply Normal Saline Moist Dressing daily until next [place] / Other MD appointment. [place] of regression in wound condition at [phone]. o Please direct any NON-WOUND related issues/requests for orders to patient's Primary Care Physician Electronic Signature(s) Signed: 11/12/2019 4:34:53 PM By: Montey Hora Signed: 11/12/2019 5:00:47 PM By: Leretha Pol, [place] ([id number]) Entered By: Montey Hora on 11/12/2019 12:06:40 Brager, [name] ([id number]) -------------------------------------------------------------------------------- Problem List Details Patient Name: [name], MACADANGDANG L. Date of Service: 11/12/2019 11:00 AM Medical Record Number: [id number] Patient Account Number: [id number] Date of Birth/Sex: 20-Jun-1956 (64 y.o. 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 other part of right lower leg with fat layer 08/27/2019 No Yes exposed L97.822 Non-pressure chronic ulcer of other part of left lower leg with fat layer 08/27/2019 No Yes exposed [place] (primary) hypertension 08/27/2019 No Yes Z79.01 Long term (current) use of anticoagulants 08/27/2019 No Yes Inactive Problems Resolved Problems Electronic Signature(s) Signed: 11/12/2019 11:29:28 AM By: Worthy Keeler PA-C Entered By: Worthy Keeler on 11/12/2019 11:29:27 [place], [place] ([id number]) -------------------------------------------------------------------------------- Progress Note Details Patient Name: [name] L. Date of Service: 11/12/2019 11:00 AM Medical Record Number: [id number] Patient Account Number: [id number] Date of Birth/Sex: 11/22/1955 (64 y.o. F) Treating RN: Montey Hora Primary Care Provider: Neta Ehlers Other Clinician: Referring Provider: Neta Ehlers Treating Provider/Extender: Melburn Hake, Danyal Adorno Weeks in Treatment: 11 Subjective Chief Complaint Information obtained from Patient Bilateral LE Lymphedema and ulcers History of Present Illness (HPI) 08/27/2019 patient presents today  for initial evaluation here in our clinic concerning issues that she has been having with her bilateral lower extremities with lymphedema and weeping. She is previously been a patient over the past year of Dr. Jerilee Hoh at the wound care center in [facility]. With that being said she subsequently was recommended by the lymphedema clinic here in [place] to follow-up with [place] here instead of going back to [place] since they were in closer contact with the clinic here than they are obviously. With that being said the patient states that is why she came her way in order to be evaluated at this point. Fortunately there is no signs of active infection at this time. No fevers, chills, nausea, vomiting, or diarrhea. The patient does have a history of lymphedema, hypertension, long-term use of anticoagulant therapy, and obstructive sleep apnea. During the visit today and from checking into the discharge she was prone to unexpectedly falling asleep. I did discuss this with her daughter as well who states that this is very common for her  she states she is not really sure how compliant her mother is with her CPAP machine that may be part of the issue. The patient did have ABIs performed which revealed have copies of his well today that was on 05/01/2019 and it showed a right ABI of 1.17 with a TBI of 1.12 and a left ABI of 1.24 with a TBI of 0.96. She has had 2 transfusions in the past 2 weeks apparently as well. 08/31/2019 upon evaluation today patient actually appears to be doing somewhat better in regard to her lower extremity ulcers. She still has a tremendous amount of drainage from the left lower extremity unfortunately but her right lower extremity is doing much better which is great news. Overall I am very pleased in this regard. With regard to home health we finally got things settled so home health will be coming out to see her this would be very beneficial in my opinion as I think they  will be able to help her tremendously with more frequent dressing changes. 12/28-Patient is back here at 1 week visit, with regard to her bilateral lower extremity ulcers, she has a significant degree of lymphedema, significant degree of drainage more from the left and which continues to be an issue, patient had not been using her lymphedema pumps as recommended, she is concerned about amount of drainage and how quickly it soaks the dressing, she is concerned about the lymphedema pump sticking to her skin and was told to use it on the wraps. She will get in touch with her cardiologist about adjusting her Lasix dose. 09/14/2019 on evaluation today patient appears to be doing poorly in regard to her lower extremities especially left lower extremity. Unfortunately she is continue to have a significant amount of drainage which is preventing her from being able to heal keeping the wounds very macerated. She also has some warmth to touch in the anterior to medial portion of the left lower extremity compared to the remainder of the wound which does have made somewhat concerned about the possibility of cellulitis. Obviously it can be difficult to determine just strictly lymphedema versus cellulitis but right now I am somewhat concerned. She tells me that she can put a wrap on and at least has to change this twice a day or else she will be walking around leaving puddles of water behind her. Obviously this is fairly significant. She did see her primary care provider they did not see anything going on that they felt needed to be addressed from a diuretic standpoint but nonetheless I am still somewhat concerned based on what we are seeing to be honest. The patient and her husband are likewise concerned. 09/17/2019 patient is seen today in follow-up I last saw her on Tuesday at that point based on the severity of her lower extremity edema, weeping, and erythema I did send her to the ER for further evaluation and  treatment. She subsequently did go for that evaluation. It did appear that based on my review of the notes this is dated 09/14/2019 that the patient did have a BNP of 149.4. Which was notable. Subsequently she was given a dose of IV vancomycin as well as doxycycline and Keflex. It appears however that she was discharged on oral clindamycin 150 mg every 6 hours and to be honest compared to what I saw on Tuesday she actually seems to be doing somewhat better. Fortunately there is no evidence of active infection at this time which is good news. In the  interim she did yesterday see her cardiologist as well Dr. Bettina Gavia. That was on 09/16/2019. Upon review of this note it does appear that his recommendation at this point was to increase the furosemide to 40 mg 2 times a day. He also recommend an increase of potassium 10 mg taken 2 times a day. His other recommendation was that the patient needs to have a follow-up with her hematology oncologist in order to see if they can do anything about iron transfusions for her as unfortunately her iron is still quite low and obviously oral supplementation is not going to be the best way to get this up. 09/24/2019 upon evaluation today patient appears to be doing quite well with regard to her lower extremities. The right lower extremity in particular has dramatically improved to be honest so has the left in my opinion. There does not appear to be any signs of active infection which is good news. No fevers, chills, nausea, vomiting, or diarrhea. Patient is still taking the clindamycin at this time. 10/01/2019 on evaluation today patient appears to be showing some signs of improvement at this point in regard to her lower extremities. The right lower extremity is doing quite well the left lower extremity is still draining but again I do believe this is significantly better compared to what it was in the past. Fortunately there is no evidence of active infection at this time. No  fever chills noted 10/08/2019 upon evaluation today patient appears to have smaller areas of erythema and weeping on the left lower extremity as compared to last week's evaluation. The right leg is doing excellent. With that being said unfortunately she is still having a lot of significant swelling on the left in fact the swelling is worse today than it was last week. With that being said she is telling me that she is able to keep her wraps on now for at least the day sometimes even up to 30 hours before they are having to be changed. When they do have to change it they are using her lymphedema wraps at home [place], [place]. ([id number]) to reapply what they can. She only changes that she tells me when it actually gets to the point that is draining so much that it leaks onto her floor. 10/15/2019 upon evaluation today patient appears to actually be doing very well with regard to her lower extremities in fact I believe the right lower extremity may be completely sealed up that we will likely watch it 1 more week. The left lower extremity is doing better she does not seem to be having as much weeping and leaking but still this is going on. Home health still has not gotten the [place] there is still just using ABD pads I believe was able to get Lauraine Rinne this would actually help out. 10/21/2019 upon evaluation today patient appears to be doing much better in regard to her lower extremities bilaterally. The right lower extremity has 1 area remaining that still draining the left lower extremity is still draining but not nearly what it was in the past. Overall I am extremely pleased with how things seem to be progressing. 10/29/2019 upon evaluation today patient actually appears to be doing quite well in regard to her lower extremities bilaterally both extremities show signs of improvement. The biggest issue we seem to be having is that unfortunately the home health nurse does not seem to be wrapping this  quite right in that it seems to slide down within just a  few hours of them coming out to rewrap her according to the patient and her husband. The wraps we put on the state last much longer in fact generally until they are taken off. Fortunately there is no signs of infection although unfortunately she does still have a few open areas on both lower extremities at this time. 11/12/2019 upon evaluation today patient appears to be doing excellent in regard to her wounds. She does not have weeping like she is had in the past and overall I feel like she is doing quite well. With that being said this is something that has continued to give her trouble and that she states that the wrap is sliding down especially when home health puts this on. She tells me that the ones we put on do not seem to be sliding down at this point. Nonetheless I think that this is definitely something of concern. Obviously if it slides down can cause trouble with getting the area to effectively heal and continue to do well. Objective Constitutional Well-nourished and well-hydrated in no acute distress. Vitals Time Taken: 11:33 AM, Height: 62 in, Weight: 290 lbs, BMI: 53, Temperature: 97.8 F, Pulse: 98 bpm, Respiratory Rate: 16 breaths/min, Blood Pressure: 156/66 mmHg. Respiratory normal breathing without difficulty. Psychiatric this patient is able to make decisions and demonstrates good insight into disease process. Alert and Oriented x 3. pleasant and cooperative. General Notes: Patient's wounds currently appear to be very minimal compared to what they have been in the past overall her left lower extremity edema is still quite significant but again her weeping has dramatically improved overall and very pleased in this regard there is no signs of active infection at this time. Integumentary (Hair, Skin) Wound #1 status is Open. Original cause of wound was Gradually Appeared. The wound is located on the Left,Medial,Anterior  Lower Leg. The wound measures 1.7cm length x 2.7cm width x 0.1cm depth; 3.605cm^2 area and 0.36cm^3 volume. There is Fat Layer (Subcutaneous Tissue) Exposed exposed. There is a large amount of serous drainage noted. The wound margin is indistinct and nonvisible. There is large (67-100%) red granulation within the wound bed. There is no necrotic tissue within the wound bed. Wound #2 status is Open. Original cause of wound was Gradually Appeared. The wound is located on the Right,Lateral Lower Leg. The wound measures 0.8cm length x 0.3cm width x 0.1cm depth; 0.188cm^2 area and 0.019cm^3 volume. There is Fat Layer (Subcutaneous Tissue) Exposed exposed. There is a medium amount of serous drainage noted. The wound margin is flat and intact. There is no granulation within the wound bed. There is a large (67-100%) amount of necrotic tissue within the wound bed including Eschar. Wound #3 status is Open. Original cause of wound was Gradually Appeared. The wound is located on the Left,Lateral Lower Leg. The wound measures 0.5cm length x 0.5cm width x 0.1cm depth; 0.196cm^2 area and 0.02cm^3 volume. There is Fat Layer (Subcutaneous Tissue) Exposed exposed. There is a large amount of serous drainage noted. The wound margin is flat and intact. There is large (67-100%) red granulation within the wound bed. There is a small (1-33%) amount of necrotic tissue within the wound bed including Adherent Slough. Wound #4 status is Open. Original cause of wound was Gradually Appeared. The wound is located on the Right,Posterior Lower Leg. The wound measures 0.5cm length x 0.5cm width x 0.1cm depth; 0.196cm^2 area and 0.02cm^3 volume. There is a medium amount of serous drainage noted. The wound margin is flat and intact.  There is no granulation within the wound bed. There is a large (67-100%) amount of necrotic tissue within the wound bed including Eschar and Adherent Slough. [place], [place] ([id number]) Wound #5 status is  Open. Original cause of wound was Gradually Appeared. The wound is located on the Left,Proximal Lower Leg. The wound measures 0.5cm length x 0.7cm width x 0.1cm depth; 0.275cm^2 area and 0.027cm^3 volume. The wound is limited to skin breakdown. There is a large amount of serous drainage noted. The wound margin is flat and intact. There is no granulation within the wound bed. There is no necrotic tissue within the wound bed. Assessment Active Problems ICD-10 Lymphedema, not elsewhere classified Non-pressure chronic ulcer of other part of right lower leg with fat layer exposed Non-pressure chronic ulcer of other part of left lower leg with fat layer exposed Essential (primary) hypertension Long term (current) use of anticoagulants Procedures Wound #1 Pre-procedure diagnosis of Wound #1 is a Lymphedema located on the Left,Medial,Anterior Lower Leg . There was a Four Layer Compression Therapy Procedure with a pre-treatment ABI of 1.2 by Montey Hora, RN. Post procedure Diagnosis Wound #1: Same as Pre-Procedure Wound #2 Pre-procedure diagnosis of Wound #2 is a Lymphedema located on the Right,Lateral Lower Leg . There was a Four Layer Compression Therapy Procedure with a pre-treatment ABI of 1.2 by Montey Hora, RN. Post procedure Diagnosis Wound #2: Same as Pre-Procedure Wound #3 Pre-procedure diagnosis of Wound #3 is a Lymphedema located on the Left,Lateral Lower Leg . There was a Four Layer Compression Therapy Procedure with a pre-treatment ABI of 1.2 by Montey Hora, RN. Post procedure Diagnosis Wound #3: Same as Pre-Procedure Wound #4 Pre-procedure diagnosis of Wound #4 is a Lymphedema located on the Right,Posterior Lower Leg . There was a Four Layer Compression Therapy Procedure with a pre-treatment ABI of 1.2 by Montey Hora, RN. Post procedure Diagnosis Wound #4: Same as Pre-Procedure Wound #5 Pre-procedure diagnosis of Wound #5 is a Lymphedema located on the Left,Proximal  Lower Leg . There was a Four Layer Compression Therapy Procedure with a pre-treatment ABI of 1.2 by Montey Hora, RN. Post procedure Diagnosis Wound #5: Same as Pre-Procedure Plan Wound Cleansing: Wound #1 Left,Medial,Anterior Lower Leg: Cleanse wound with mild soap and water May shower with protection. - Do not get your wraps wet Wound #2 Right,Lateral Lower Leg: Cleanse wound with mild soap and water LEN[place], [place]. ([id number]) May shower with protection. - Do not get your wraps wet Wound #3 Left,Lateral Lower Leg: Cleanse wound with mild soap and water May shower with protection. - Do not get your wraps wet Wound #4 Right,Posterior Lower Leg: Cleanse wound with mild soap and water May shower with protection. - Do not get your wraps wet Wound #5 Left,Proximal Lower Leg: Cleanse wound with mild soap and water May shower with protection. - Do not get your wraps wet Skin Barriers/Peri-Wound Care: Triamcinolone Acetonide Ointment (TCA) - to either leg on places that itch - ask patient Primary Wound Dressing: Wound #1 Left,Medial,Anterior Lower Leg: Silver Alginate Wound #2 Right,Lateral Lower Leg: Silver Alginate Wound #3 Left,Lateral Lower Leg: Silver Alginate Wound #4 Right,Posterior Lower Leg: Silver Alginate Wound #5 Left,Proximal Lower Leg: Silver Alginate Secondary Dressing: Wound #1 Left,Medial,Anterior Lower Leg: ABD pad Wound #2 Right,Lateral Lower Leg: ABD pad Wound #3 Left,Lateral Lower Leg: ABD pad Wound #4 Right,Posterior Lower Leg: ABD pad Wound #5 Left,Proximal Lower Leg: ABD pad Dressing Change Frequency: Wound #1 Left,Medial,Anterior Lower Leg: Change Dressing Monday, Wednesday, Friday - Friday  in office Wound #2 Right,Lateral Lower Leg: Change Dressing Monday, Wednesday, Friday - Friday in office Wound #3 Left,Lateral Lower Leg: Change Dressing Monday, Wednesday, Friday - Friday in office Wound #4 Right,Posterior Lower Leg: Change Dressing  Monday, Wednesday, Friday - Friday in office Wound #5 Left,Proximal Lower Leg: Change Dressing Monday, Wednesday, Friday - Friday in office Follow-up Appointments: Return Appointment in 1 week. Edema Control: Wound #1 Left,Medial,Anterior Lower Leg: 4 Layer Compression System - Bilateral - Wrap from toes to 3cm below the knee - please anchor with unna at the top to prevent wraps from sliding down Wound #2 Right,Lateral Lower Leg: 4 Layer Compression System - Bilateral - Wrap from toes to 3cm below the knee - please anchor with unna at the top to prevent wraps from sliding down Wound #3 Left,Lateral Lower Leg: 4 Layer Compression System - Bilateral - Wrap from toes to 3cm below the knee - please anchor with unna at the top to prevent wraps from sliding down Wound #4 Right,Posterior Lower Leg: 4 Layer Compression System - Bilateral - Wrap from toes to 3cm below the knee - please anchor with unna at the top to prevent wraps from sliding down Wound #5 Left,Proximal Lower Leg: 4 Layer Compression System - Bilateral - Wrap from toes to 3cm below the knee - please anchor with unna at the top to prevent wraps from sliding down Home Health: Wound #1 Left,Medial,Anterior Lower Leg: Continue Home Health Visits - [place] Nurse may visit PRN to address patient s wound care needs. FACE TO FACE ENCOUNTER: MEDICARE and MEDICAID PATIENTS: I certify that this patient is under my care and that I had a face-to-face encounter that meets the physician face-to-face encounter requirements with this patient on this date. The encounter with the patient was in whole or in part for the following MEDICAL CONDITION: (primary reason for [place]) MEDICAL NECESSITY: I certify, that based on my findings, NURSING services are a medically necessary home health service. HOME BOUND STATUS: I certify that my clinical findings support that this patient is homebound (i.e., Due to illness or injury, pt  requires aid of supportive devices such as crutches, cane, wheelchairs, walkers, the use of special [place], [place] ([id number]) transportation or the assistance of another person to leave their place of residence. There is a normal inability to leave the home and doing so requires considerable and taxing effort. Other absences are for medical reasons / religious services and are infrequent or of short duration when for other reasons). If current dressing causes regression in wound condition, may D/C ordered dressing product/s and apply Normal Saline Moist Dressing daily until next [place] / Other MD appointment. [place] of regression in wound condition at [phone]. Please direct any NON-WOUND related issues/requests for orders to patient's Primary Care Physician Wound #2 Right,Lateral Lower Leg: [place] Visits - [place] Nurse may visit PRN to address patient s wound care needs. FACE TO FACE ENCOUNTER: MEDICARE and MEDICAID PATIENTS: I certify that this patient is under my care and that I had a face-to-face encounter that meets the physician face-to-face encounter requirements with this patient on this date. The encounter with the patient was in whole or in part for the following MEDICAL CONDITION: (primary reason for [place]) MEDICAL NECESSITY: I certify, that based on my findings, NURSING services are a medically necessary home health service. HOME BOUND STATUS: I certify that my clinical findings support that this patient is homebound (  i.e., Due to illness or injury, pt requires aid of supportive devices such as crutches, cane, wheelchairs, walkers, the use of special transportation or the assistance of another person to leave their place of residence. There is a normal inability to leave the home and doing so requires considerable and taxing effort. Other absences are for medical reasons / religious services and are  infrequent or of short duration when for other reasons). If current dressing causes regression in wound condition, may D/C ordered dressing product/s and apply Normal Saline Moist Dressing daily until next [place] / Other MD appointment. [place] of regression in wound condition at [phone]. Please direct any NON-WOUND related issues/requests for orders to patient's Primary Care Physician Wound #3 Left,Lateral Lower Leg: [place] Visits - [place] Nurse may visit PRN to address patient s wound care needs. FACE TO FACE ENCOUNTER: MEDICARE and MEDICAID PATIENTS: I certify that this patient is under my care and that I had a face-to-face encounter that meets the physician face-to-face encounter requirements with this patient on this date. The encounter with the patient was in whole or in part for the following MEDICAL CONDITION: (primary reason for [place]) MEDICAL NECESSITY: I certify, that based on my findings, NURSING services are a medically necessary home health service. HOME BOUND STATUS: I certify that my clinical findings support that this patient is homebound (i.e., Due to illness or injury, pt requires aid of supportive devices such as crutches, cane, wheelchairs, walkers, the use of special transportation or the assistance of another person to leave their place of residence. There is a normal inability to leave the home and doing so requires considerable and taxing effort. Other absences are for medical reasons / religious services and are infrequent or of short duration when for other reasons). If current dressing causes regression in wound condition, may D/C ordered dressing product/s and apply Normal Saline Moist Dressing daily until next [place] / Other MD appointment. [place] of regression in wound condition at [phone]. Please direct any NON-WOUND related issues/requests for orders  to patient's Primary Care Physician Wound #4 Right,Posterior Lower Leg: [place] Visits - [place] Nurse may visit PRN to address patient s wound care needs. FACE TO FACE ENCOUNTER: MEDICARE and MEDICAID PATIENTS: I certify that this patient is under my care and that I had a face-to-face encounter that meets the physician face-to-face encounter requirements with this patient on this date. The encounter with the patient was in whole or in part for the following MEDICAL CONDITION: (primary reason for [place]) MEDICAL NECESSITY: I certify, that based on my findings, NURSING services are a medically necessary home health service. HOME BOUND STATUS: I certify that my clinical findings support that this patient is homebound (i.e., Due to illness or injury, pt requires aid of supportive devices such as crutches, cane, wheelchairs, walkers, the use of special transportation or the assistance of another person to leave their place of residence. There is a normal inability to leave the home and doing so requires considerable and taxing effort. Other absences are for medical reasons / religious services and are infrequent or of short duration when for other reasons). If current dressing causes regression in wound condition, may D/C ordered dressing product/s and apply Normal Saline Moist Dressing daily until next [place] / Other MD appointment. [place] of regression in wound condition at [phone]. Please direct any NON-WOUND related issues/requests  for orders to patient's Primary Care Physician Wound #5 Left,Proximal Lower Leg: [place] Visits - [place] Nurse may visit PRN to address patient s wound care needs. FACE TO FACE ENCOUNTER: MEDICARE and MEDICAID PATIENTS: I certify that this patient is under my care and that I had a face-to-face encounter that meets the physician face-to-face encounter requirements with  this patient on this date. The encounter with the patient was in whole or in part for the following MEDICAL CONDITION: (primary reason for [place]) MEDICAL NECESSITY: I certify, that based on my findings, NURSING services are a medically necessary home health service. HOME BOUND STATUS: I certify that my clinical findings support that this patient is homebound (i.e., Due to illness or injury, pt requires aid of supportive devices such as crutches, cane, wheelchairs, walkers, the use of special transportation or the assistance of another person to leave their place of residence. There is a normal inability to leave the home and doing so requires considerable and taxing effort. Other absences are for medical reasons / religious services and are infrequent or of short duration when for other reasons). If current dressing causes regression in wound condition, may D/C ordered dressing product/s and apply Normal Saline Moist Dressing daily until next [place] / Other MD appointment. [place] of regression in wound condition at [phone]. Please direct any NON-WOUND related issues/requests for orders to patient's Primary Care Physician 1, recommend currently that we continue with the wound care measures specifically with regard to utilizing the alginate dressings to any open wound locations. Again the good news that she is not weeping through her dressings any longer. 2. I am also can recommend that she continue with the 4 layer compression wrap we will use that to anchor at the top. I discussed with her to switching to using a dressing 1 time we can reapply here in the office but she is not really interested in that as she states she needs to still apply the triamcinolone to help with itching. 3. I am going to recommend as well that the patient also needs to continue to try to elevate her legs as much as possible. Obviously the more she can [place], [place]  ([id number]) elevate the better she tells me she is really not doing the elevation like she should be and she knows that. I explained that is very important part of her treatment process. We will see patient back for reevaluation in 1 week here in the clinic. If anything worsens or changes patient will contact our office for additional recommendations. Electronic Signature(s) Signed: 11/12/2019 2:03:16 PM By: Worthy Keeler PA-C Entered By: Worthy Keeler on 11/12/2019 14:03:15 [place], [name] ([id number]) -------------------------------------------------------------------------------- SuperBill Details Patient Name: [name]. Date of Service: 11/12/2019 Medical Record Number: [id number] Patient Account Number: [id number] Date of Birth/Sex: February 11, 1956 (64 y.o. F) Treating RN: Montey Hora Primary Care Provider: Neta Ehlers Other Clinician: Referring Provider: Neta Ehlers Treating Provider/Extender: Melburn Hake, [place] Weeks in Treatment: 11 Diagnosis Coding ICD-10 Codes Code Description I89.0 Lymphedema, not elsewhere classified L97.812 Non-pressure chronic ulcer of other part of right lower leg with fat layer exposed L97.822 Non-pressure chronic ulcer of other part of left lower leg with fat layer exposed [place] (primary) hypertension Z79.01 Long term (current) use of anticoagulants Facility Procedures CPT4: Description Modifier Quantity Code [id number] BILATERAL: Application of multi-layer venous compression system; leg (below knee), including ankle 1 and foot. Physician Procedures CPT4  Code: [id number] Description: [id number] - WC PHYS LEVEL 3 - EST PT Modifier: Quantity: 1 CPT4 Code: Description: ICD-10 Diagnosis Description I89.0 Lymphedema, not elsewhere classified [id number] Non-pressure chronic ulcer of other part of right lower leg with fat layer L97.822 Non-pressure chronic ulcer of other part of left lower leg with fat layer e  I10 Essential (primary)  hypertension Modifier: exposed xposed Quantity: Electronic Signature(s) Signed: 11/12/2019 2:03:26 PM By: Worthy Keeler PA-C Entered By: Worthy Keeler on 11/12/2019 14:03:26

## 2019-11-15 ENCOUNTER — Ambulatory Visit (INDEPENDENT_AMBULATORY_CARE_PROVIDER_SITE_OTHER): Payer: Medicare Other | Admitting: Cardiology

## 2019-11-15 DIAGNOSIS — G459 Transient cerebral ischemic attack, unspecified: Secondary | ICD-10-CM

## 2019-11-15 DIAGNOSIS — Z954 Presence of other heart-valve replacement: Secondary | ICD-10-CM | POA: Diagnosis not present

## 2019-11-15 DIAGNOSIS — Z7901 Long term (current) use of anticoagulants: Secondary | ICD-10-CM

## 2019-11-15 LAB — POCT INR: INR: 1 — AB (ref 2.0–3.0)

## 2019-11-17 NOTE — Progress Notes (Signed)
Virtual Visit via Video Note   This visit type was conducted due to national recommendations for restrictions regarding the COVID-19 Pandemic (e.g. social distancing) in an effort to limit this patient's exposure and mitigate transmission in our community.  Due to her co-morbid illnesses, this patient is at least at moderate risk for complications without adequate follow up.  This format is felt to be most appropriate for this patient at this time.  All issues noted in this document were discussed and addressed.  A limited physical exam was performed with this format.  Please refer to the patient's chart for her consent to telehealth for [facility].   The patient was identified using 2 identifiers.  Date:  11/18/2019   ID:  [name], DOB 05-Jan-1956, MRN [id number]  Patient Location: Home Provider Location: Office  PCP:  [name], MD  Cardiologist:  Shirlee More, MD  Electrophysiologist:  None   Evaluation Performed:  Follow-Up Visit  Chief Complaint:  Heart failure  History of Present Illness:    [name] is a 64 y.o. female with severe symptomatic aortic stenosis with heart failure and underwent 23 mm [place] SAVR on 03/30/13 Dr Clementeen Graham at [facility] , hypertension and heart failure. Recent TEE showed dynamic LVOT obstruction and normal AVR function. She was  diagnosed with  severe obstructive and central sleep apnea with nocturnal hypoxemia nadir 59% and initiated on CPAP which was changed to BiPAP.  Her other problems include nonhealing leg ulcers and dysfunctional uterine bleeding pending D&C.   She was admitted to [facility] with chest pain 03/30/2019.  Coronary angiography was normal.  She underwent left and right heart catheterization showing elevated left atrial pressure and increased gradient across her valve.  She previously underwent transesophageal echocardiogram and was found to have dynamic LV outflow tract obstruction.  She was admitted to [facility] 06/09/2019 with TIA underwent evaluation with transesophageal echocardiogram showing normal AVR function but TAVR protocol cardiac CTA implied leaflet thrombosis despite her severe anemia with dysfunctional uterine bleeding she was anticoagulated with warfarin.  Her hospital telemetry showed frequent PACs and brief runs of atrial tachycardia she was also noted to have a patent foramen ovale  The patient does not have symptoms concerning for COVID-19 infection (fever, chills, cough, or new shortness of breath).   She is feeling better her venous ulcers have all but healed less edema weight is down in the range of 8 to 10 pounds and less short of breath.  She has had 2 iron infusions is due to be seen by her GYN oncologist she will have her iron stores checked and I will ask him to go ahead and check her renal function and proBNP level with a copy sent to me.  She will remind him.  I think a big part of her decompensated heart failure is the anemia and she is requesting advanced heart failure consultation and her husband is concerned that she may have amyloidosis complicating her aortic valve disease. Past Medical History:  Diagnosis Date  . Aortic stenosis 03/23/2013   Overview:  02/18/13 TTE EF >55%. Critical AS with mean Ao valve gradient of 82 mm Hg. No AI. No MR, PR, mild TR. Estimated RVSP 30 mm Hg.  . Bicuspid aortic valve   . Edema of both legs 02/20/2017  . 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 Heart failure ([place])   . HTN (hypertension) 03/23/2013  . 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  Morbid obesity ([place]) 02/20/2017  . Sleep apnea    Past Surgical History:  Procedure Laterality Date  . BUBBLE STUDY  06/07/2019   Procedure: BUBBLE STUDY;  Surgeon: Buford Dresser, MD;  Location: [place];  Service: Cardiovascular;;  . CHOLECYSTECTOMY  1983  . RIGHT/LEFT HEART CATH AND CORONARY ANGIOGRAPHY N/A 04/01/2019   Procedure: RIGHT/LEFT HEART CATH AND CORONARY  ANGIOGRAPHY;  Surgeon: Lorretta Harp, MD;  Location: [place] CV LAB;  Service: Cardiovascular;  Laterality: N/A;  . TEE WITHOUT CARDIOVERSION N/A 07/01/2018   Procedure: TRANSESOPHAGEAL ECHOCARDIOGRAM (TEE);  Surgeon: Buford Dresser, MD;  Location: [facility] ENDOSCOPY;  Service: Cardiovascular;  Laterality: N/A;  . TEE WITHOUT CARDIOVERSION N/A 06/07/2019   Procedure: TRANSESOPHAGEAL ECHOCARDIOGRAM (TEE);  Surgeon: Buford Dresser, MD;  Location: [facility] ENDOSCOPY;  Service: Cardiovascular;  Laterality: N/A;  . TISSUE AORTIC VALVE REPLACEMENT       Current Meds  Medication Sig  . Acetaminophen-Codeine (TYLENOL WITH CODEINE #3 PO) Take by mouth once a week.  . camphor-menthol (SARNA) lotion Apply 1 application topically as needed for itching.  . furosemide (LASIX) 40 MG tablet Take 1 tablet (40 mg total) by mouth 2 (two) times daily.  . metoprolol tartrate (LOPRESSOR) 25 MG tablet Take 1 tablet (25 mg total) by mouth 2 (two) times daily.  . potassium chloride (KLOR-CON) 10 MEQ tablet Take 1 tablet (10 mEq total) by mouth 2 (two) times daily.  . pravastatin (PRAVACHOL) 40 MG tablet Take 1 tablet (40 mg total) by mouth daily at 6 PM.  . ramelteon (ROZEREM) 8 MG tablet Take 1 tablet (8 mg total) by mouth at bedtime.  . triamcinolone cream (KENALOG) 0.1 % APPLY THIN FILM OF CREAM OVER THE LEG WITH EACH WRAP CHANGE. NOT TO BE PLACED IN THE WOUNDS  . warfarin (COUMADIN) 2.5 MG tablet Take 1 tablet (2.5 mg total) by mouth daily.     Allergies:   Vancomycin   Social History   Tobacco Use  . Smoking status: Never Smoker  . Smokeless tobacco: Never Used  Substance Use Topics  . Alcohol use: No  . Drug use: No     Family Hx: The patient's family history includes Heart attack in her mother; Parkinson's disease in her father.  ROS:   Please see the history of present illness.     All other systems reviewed and are negative.   Prior CV studies:   The following studies were  reviewed today:  Hemoglobin 10/19/2019 8.6 I reviewed the records from a GYN oncologist in [place] prior to the visit  Labs/Other Tests and Data Reviewed:     Recent Labs: 02/12/2019: NT-Pro BNP 640 06/03/2019: ALT 18 06/05/2019: TSH 5.585 06/08/2019: Magnesium 2.2 09/14/2019: BUN 11; Creatinine, Ser 0.77; Hemoglobin 8.8; Platelets 362; Potassium 3.9; Sodium 137 09/15/2019: B Natriuretic Peptide 149.4   Recent Lipid Panel Lab Results  Component Value Date/Time   CHOL 123 06/04/2019 05:07 AM   CHOL 125 02/12/2019 11:16 AM   TRIG 111 06/04/2019 05:07 AM   HDL 30 (L) 06/04/2019 05:07 AM   HDL 37 (L) 02/12/2019 11:16 AM   CHOLHDL 4.1 06/04/2019 05:07 AM   LDLCALC 71 06/04/2019 05:07 AM   LDLCALC 64 02/12/2019 11:16 AM    Wt Readings from Last 3 Encounters:  11/18/19 272 lb (123.4 kg)  10/22/19 275 lb (124.7 kg)  10/07/19 278 lb 12.8 oz (126.5 kg)     Objective:    Vital Signs:  BP 140/77   Pulse 87   Ht  5\' 1"  (1.549 m)   Wt 272 lb (123.4 kg)   BMI 51.39 kg/m    VITAL SIGNS:  reviewed GEN:  no acute distress EYES:  sclerae anicteric, EOMI - Extraocular Movements Intact RESPIRATORY:  normal respiratory effort, symmetric expansion CARDIOVASCULAR:  no peripheral edema SKIN:  no rash, lesions or ulcers. MUSCULOSKELETAL:  no obvious deformities. NEURO:  alert and oriented x 3, no obvious focal deficit PSYCH:  normal affect She has less lower extremity edema difficult to judge pending from a video visit ASSESSMENT & PLAN:    1. Status post AVR bioprosthetic she has had leaflet thrombosis with TIAs anticoagulated with warfarin.  Goal INR 2.5 2. Heart failure chronic diastolic in the setting of longstanding poorly controlled hypertension and aortic valve disease at the family's request I referred her to Dr. Haroldine Laws and they are concerned about the potential she may have coincident amyloidosis.  A big part of her heart failure has been anemia and she is receiving iron infusions  and clinically is improving.  I will ask her GYN oncologist at [place] to check renal function tomorrow.  We will continue current antihypertensives 3. Iron deficiency anemia and remains severely anemic but is received 2 infusions of IV iron and I suspect once we can achieve an adequate hemoglobin greater than 9.5-10 her heart failure will steadily and fully improve.  She has received exceptional care wound care and her lower extremity venous wounds are improved 4. Severe sleep apnea diagnosed and treated compliant with CPAP  COVID-19 Education: The signs and symptoms of COVID-19 were discussed with the patient and how to seek care for testing (follow up with PCP or arrange E-visit).  The importance of social distancing was discussed today.  Time:   Today, I have spent 23 minutes with the patient with telehealth technology discussing the above problems.     Medication Adjustments/Labs and Tests Ordered: Current medicines are reviewed at length with the patient today.  Concerns regarding medicines are outlined above.   Tests Ordered: No orders of the defined types were placed in this encounter.   Medication Changes: No orders of the defined types were placed in this encounter.   Follow Up:  Virtual Visit  6 weeks  Signed, Shirlee More, MD  11/18/2019 9:40 AM    [place]

## 2019-11-18 ENCOUNTER — Telehealth (INDEPENDENT_AMBULATORY_CARE_PROVIDER_SITE_OTHER): Payer: Medicare Other | Admitting: Cardiology

## 2019-11-18 ENCOUNTER — Encounter: Payer: Self-pay | Admitting: Cardiology

## 2019-11-18 VITALS — BP 140/77 | HR 87 | Ht 61.0 in | Wt 272.0 lb

## 2019-11-18 DIAGNOSIS — Z954 Presence of other heart-valve replacement: Secondary | ICD-10-CM

## 2019-11-18 DIAGNOSIS — G4733 Obstructive sleep apnea (adult) (pediatric): Secondary | ICD-10-CM

## 2019-11-18 DIAGNOSIS — I5032 Chronic diastolic (congestive) heart failure: Secondary | ICD-10-CM

## 2019-11-18 DIAGNOSIS — I110 Hypertensive heart disease with heart failure: Secondary | ICD-10-CM

## 2019-11-18 DIAGNOSIS — Z7901 Long term (current) use of anticoagulants: Secondary | ICD-10-CM

## 2019-11-18 DIAGNOSIS — D508 Other iron deficiency anemias: Secondary | ICD-10-CM

## 2019-11-18 NOTE — Patient Instructions (Addendum)
Medication Instructions:  Your physician recommends that you continue on your current medications as directed. Please refer to the Current Medication list given to you today.  If you need a refill on your cardiac medications before your next appointment, please call your pharmacy.   Lab work: None Ordered  If you have labs (blood work) drawn today and your tests are completely normal, you will receive your results only by: Marland Kitchen MyChart Message (if you have MyChart) OR . A paper copy in the mail If you have any lab test that is abnormal or we need to change your treatment, we will call you to review the results.  Testing/Procedures: None ordered  Follow-Up: Follow up with Dr. Bettina Gavia via VIRTUAL VISIT ON 12/23/19 at 9:20 AM  Any Other Special Instructions Will Be Listed Below (If Applicable).

## 2019-11-19 ENCOUNTER — Encounter: Payer: Medicare Other | Admitting: Physician Assistant

## 2019-11-19 ENCOUNTER — Telehealth: Payer: Medicare Other | Admitting: Cardiology

## 2019-11-19 ENCOUNTER — Other Ambulatory Visit: Payer: Self-pay

## 2019-11-19 DIAGNOSIS — L97812 Non-pressure chronic ulcer of other part of right lower leg with fat layer exposed: Secondary | ICD-10-CM | POA: Diagnosis not present

## 2019-11-19 NOTE — Progress Notes (Addendum)
PIERA, BALAM ([id number]) Visit Report for 11/19/2019 Chief Complaint Document Details Patient Name: [name], [name]. Date of Service: 11/19/2019 1:30 PM Medical Record Number: [id number] Patient Account Number: [id number] Date of Birth/Sex: 02/04/1956 (64 y.o. F) Treating RN: Montey Hora Primary Care Provider: Neta Ehlers Other Clinician: Referring Provider: Neta Ehlers Treating Provider/Extender: Melburn Hake, Kimberla Driskill Weeks in Treatment: 12 Information Obtained from: Patient Chief Complaint Bilateral LE Lymphedema and ulcers Electronic Signature(s) Signed: 11/19/2019 1:56:48 PM By: Worthy Keeler PA-C Entered By: Worthy Keeler on 11/19/2019 13:56:48 [place], [place] ([id number]) -------------------------------------------------------------------------------- HPI Details Patient Name: Martha Downs L. Date of Service: 11/19/2019 1:30 PM Medical Record Number: [id number] Patient Account Number: [id number] Date of Birth/Sex: 1955/10/13 (64 y.o. F) Treating RN: Montey Hora Primary Care Provider: Neta Ehlers Other Clinician: Referring Provider: Neta Ehlers Treating Provider/Extender: Melburn Hake, Kysen Wetherington Weeks in Treatment: 12 History of Present Illness HPI Description: 08/27/2019 patient presents today for initial evaluation here in our clinic concerning issues that she has been having with her bilateral lower extremities with lymphedema and weeping. She is previously been a patient over the past year of Dr. Jerilee Hoh at the wound care center in [facility]. With that being said she subsequently was recommended by the lymphedema clinic here in [place] to follow-up with [place] here instead of going back to [place] since they were in closer contact with the clinic here than they are obviously. With that being said the patient states that is why she came her way in order to be evaluated at this point. Fortunately there is no signs of active infection at this time.  No fevers, chills, nausea, vomiting, or diarrhea. The patient does have a history of lymphedema, hypertension, long-term use of anticoagulant therapy, and obstructive sleep apnea. During the visit today and from checking into the discharge she was prone to unexpectedly falling asleep. I did discuss this with her daughter as well who states that this is very common for her she states she is not really sure how compliant her mother is with her CPAP machine that may be part of the issue. The patient did have ABIs performed which revealed have copies of his well today that was on 05/01/2019 and it showed a right ABI of 1.17 with a TBI of 1.12 and a left ABI of 1.24 with a TBI of 0.96. She has had 2 transfusions in the past 2 weeks apparently as well. 08/31/2019 upon evaluation today patient actually appears to be doing somewhat better in regard to her lower extremity ulcers. She still has a tremendous amount of drainage from the left lower extremity unfortunately but her right lower extremity is doing much better which is great news. Overall I am very pleased in this regard. With regard to home health we finally got things settled so home health will be coming out to see her this would be very beneficial in my opinion as I think they will be able to help her tremendously with more frequent dressing changes. 12/28-Patient is back here at 1 week visit, with regard to her bilateral lower extremity ulcers, she has a significant degree of lymphedema, significant degree of drainage more from the left and which continues to be an issue, patient had not been using her lymphedema pumps as recommended, she is concerned about amount of drainage and how quickly it soaks the dressing, she is concerned about the lymphedema pump sticking to her skin and was told to use it on the wraps.  She will get in touch with her cardiologist about adjusting her Lasix dose. 09/14/2019 on evaluation today patient appears to be doing  poorly in regard to her lower extremities especially left lower extremity. Unfortunately she is continue to have a significant amount of drainage which is preventing her from being able to heal keeping the wounds very macerated. She also has some warmth to touch in the anterior to medial portion of the left lower extremity compared to the remainder of the wound which does have made somewhat concerned about the possibility of cellulitis. Obviously it can be difficult to determine just strictly lymphedema versus cellulitis but right now I am somewhat concerned. She tells me that she can put a wrap on and at least has to change this twice a day or else she will be walking around leaving puddles of water behind her. Obviously this is fairly significant. She did see her primary care provider they did not see anything going on that they felt needed to be addressed from a diuretic standpoint but nonetheless I am still somewhat concerned based on what we are seeing to be honest. The patient and her husband are likewise concerned. 09/17/2019 patient is seen today in follow-up I last saw her on Tuesday at that point based on the severity of her lower extremity edema, weeping, and erythema I did send her to the ER for further evaluation and treatment. She subsequently did go for that evaluation. It did appear that based on my review of the notes this is dated 09/14/2019 that the patient did have a BNP of 149.4. Which was notable. Subsequently she was given a dose of IV vancomycin as well as doxycycline and Keflex. It appears however that she was discharged on oral clindamycin 150 mg every 6 hours and to be honest compared to what I saw on Tuesday she actually seems to be doing somewhat better. Fortunately there is no evidence of active infection at this time which is good news. In the interim she did yesterday see her cardiologist as well Dr. Bettina Gavia. That was on 09/16/2019. Upon review of this note it does appear that  his recommendation at this point was to increase the furosemide to 40 mg 2 times a day. He also recommend an increase of potassium 10 mg taken 2 times a day. His other recommendation was that the patient needs to have a follow-up with her hematology oncologist in order to see if they can do anything about iron transfusions for her as unfortunately her iron is still quite low and obviously oral supplementation is not going to be the best way to get this up. 09/24/2019 upon evaluation today patient appears to be doing quite well with regard to her lower extremities. The right lower extremity in particular has dramatically improved to be honest so has the left in my opinion. There does not appear to be any signs of active infection which is good news. No fevers, chills, nausea, vomiting, or diarrhea. Patient is still taking the clindamycin at this time. 10/01/2019 on evaluation today patient appears to be showing some signs of improvement at this point in regard to her lower extremities. The right lower extremity is doing quite well the left lower extremity is still draining but again I do believe this is significantly better compared to what it was in the past. Fortunately there is no evidence of active infection at this time. No fever chills noted 10/08/2019 upon evaluation today patient appears to have smaller areas of erythema and  weeping on the left lower extremity as compared to last week's evaluation. The right leg is doing excellent. With that being said unfortunately she is still having a lot of significant swelling on the left in fact the swelling is worse today than it was last week. With that being said she is telling me that she is able to keep her wraps on now for at least the day sometimes even up to 30 hours before they are having to be changed. When they do have to change it they are using her lymphedema wraps at home to reapply what they can. She only changes that she tells me when it  actually gets to the point that is draining so much that it leaks onto her floor. 10/15/2019 upon evaluation today patient appears to actually be doing very well with regard to her lower extremities in fact I believe the right lower extremity may be completely sealed up that we will likely watch it 1 more week. The left lower extremity is doing better she does not seem to be having as much weeping and leaking but still this is going on. Home health still has not gotten the [place] there is still just using ABD pads I believe was able [place], Sharnae L. ([id number]) to get [name] this would actually help out. 10/21/2019 upon evaluation today patient appears to be doing much better in regard to her lower extremities bilaterally. The right lower extremity has 1 area remaining that still draining the left lower extremity is still draining but not nearly what it was in the past. Overall I am extremely pleased with how things seem to be progressing. 10/29/2019 upon evaluation today patient actually appears to be doing quite well in regard to her lower extremities bilaterally both extremities show signs of improvement. The biggest issue we seem to be having is that unfortunately the home health nurse does not seem to be wrapping this quite right in that it seems to slide down within just a few hours of them coming out to rewrap her according to the patient and her husband. The wraps we put on the state last much longer in fact generally until they are taken off. Fortunately there is no signs of infection although unfortunately she does still have a few open areas on both lower extremities at this time. 11/12/2019 upon evaluation today patient appears to be doing excellent in regard to her wounds. She does not have weeping like she is had in the past and overall I feel like she is doing quite well. With that being said this is something that has continued to give her trouble and that she states that the wrap is  sliding down especially when home health puts this on. She tells me that the ones we put on do not seem to be sliding down at this point. Nonetheless I think that this is definitely something of concern. Obviously if it slides down can cause trouble with getting the area to effectively heal and continue to do well. 11/19/2019 upon evaluation today patient actually appears to be doing very well with regard to her bilateral lower extremities. She just has a couple small areas 1 on the right 2 on the left that are still open at this point and all seem to be headed in the right direction. She is no longer saturating her wraps which is also good news. Overall I am extremely pleased with the progress that is been made up to this point. I do believe we  may be able to switch into her lymphedema wraps at this point. Electronic Signature(s) Signed: 11/19/2019 3:24:49 PM By: Worthy Keeler PA-C Entered By: Worthy Keeler on 11/19/2019 15:24:49 [name], [name] ([id number]) -------------------------------------------------------------------------------- Physical Exam Details Patient Name: [name], [name] Date of Service: 11/19/2019 1:30 PM Medical Record Number: [id number] Patient Account Number: [id number] Date of Birth/Sex: 07-Sep-1956 (64 y.o. F) Treating RN: Montey Hora Primary Care Provider: Neta Ehlers Other Clinician: Referring Provider: Neta Ehlers Treating Provider/Extender: Melburn Hake, [place] Thielman Weeks in Treatment: 65 Constitutional Well-nourished and well-hydrated in no acute distress. Respiratory normal breathing without difficulty. Psychiatric this patient is able to make decisions and demonstrates good insight into disease process. Alert and Oriented x 3. pleasant and cooperative. Notes Upon inspection patient's wounds actually seem to be doing quite well and I do believe that we can likely switch to using her lymphedema wraps and then subsequently we will have her use the alginate  dressings to the open wound locations underneath her wraps of anything from draining through. Electronic Signature(s) Signed: 11/19/2019 3:25:28 PM By: Worthy Keeler PA-C Entered By: Worthy Keeler on 11/19/2019 15:25:27 [name], [name] ([id number]) -------------------------------------------------------------------------------- Physician Orders Details Patient Name: [name]. Date of Service: 11/19/2019 1:30 PM Medical Record Number: [id number] Patient Account Number: [id number] Date of Birth/Sex: 1955-09-28 (64 y.o. 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 Wound #4 Right,Posterior Lower Leg o Cleanse wound with mild soap and water o Dial antibacterial soap, wash wounds, rinse and pat dry prior to dressing wounds o May Shower, gently pat wound dry prior to applying new dressing. Wound #5 Left,Proximal Lower Leg o Cleanse wound with mild soap and water o Dial antibacterial soap, wash wounds, rinse and pat dry prior to dressing wounds o May Shower, gently pat wound dry prior to applying new dressing. Skin Barriers/Peri-Wound Care o Triamcinolone Acetonide Ointment (TCA) - to either leg  on places that itch - ask patient Primary Wound Dressing Wound #1 Left,Medial,Anterior Lower Leg o Silver Alginate Wound #4 Right,Posterior Lower Leg o Silver Alginate Wound #5 Left,Proximal Lower Leg o Silver Alginate Secondary Dressing Wound #1 Left,Medial,Anterior Lower Leg o ABD pad - conform and tape Wound #4 Right,Posterior Lower Leg o ABD pad - conform and tape Wound #5 Left,Proximal Lower Leg o ABD pad - conform and tape Dressing Change Frequency Wound #1 Left,Medial,Anterior Lower Leg o Change Dressing Monday, Wednesday, Friday - Friday in office Wound #4 Right,Posterior Lower Leg [name], CRUMP. ([id number]) o Change Dressing Monday, Wednesday, Friday - Friday in office Wound #5 Left,Proximal Lower Leg o Change Dressing Monday, Wednesday, Friday - Friday in office Follow-up Appointments o Return Appointment in 1 week. Edema Control Wound #1 Left,Medial,Anterior Lower Leg o Patient to wear own compression stockings - Lymphedema wraps o Elevate legs to the  level of the heart and pump ankles as often as possible o Compression Pump: Use compression pump on left lower extremity for 60 minutes, twice daily. o Compression Pump: Use compression pump on right lower extremity for 60 minutes, twice daily. Wound #4 Right,Posterior Lower Leg o Patient to wear own compression stockings - Lymphedema wraps o Elevate legs to the level of the heart and pump ankles as often as possible o Compression Pump: Use compression pump on left lower extremity for 60 minutes, twice daily. o Compression Pump: Use compression pump on right lower extremity for 60 minutes, twice daily. Wound #5 Left,Proximal Lower Leg o Patient to wear own compression stockings - Lymphedema wraps o Elevate legs to the level of the heart and pump ankles as often as possible o Compression Pump: Use compression pump on left lower extremity for 60 minutes, twice daily. o  Compression Pump: Use compression pump on right lower extremity for 60 minutes, twice daily. Home Health Wound #1 Left,Medial,Anterior Lower Leg o D/C Home Health Services Wound #4 Right,Posterior Lower Leg o D/C Home Health Services Wound #5 Left,Proximal Lower Leg o D/C Home Health Services Electronic Signature(s) Signed: 11/19/2019 4:04:12 PM By: Montey Hora Signed: 11/19/2019 4:19:26 PM By: Worthy Keeler PA-C Entered By: Montey Hora on 11/19/2019 14:21:19 [place], [name] ([id number]) -------------------------------------------------------------------------------- Problem List Details Patient Name: NEKESHA, MESTER L. Date of Service: 11/19/2019 1:30 PM Medical Record Number: [id number] Patient Account Number: [id number] Date of Birth/Sex: 10/04/1955 (64 y.o. F) Treating RN: Montey Hora Primary Care Provider: Neta Ehlers Other Clinician: Referring Provider: Neta Ehlers Treating Provider/Extender: Sharalyn Ink in Treatment: 12 Active Problems ICD-10 Evaluated Encounter Code Description Active Date Today Diagnosis I89.0 Lymphedema, not elsewhere classified 08/27/2019 No Yes L97.812 Non-pressure chronic ulcer of other part of right lower leg with fat layer 08/27/2019 No Yes exposed L97.822 Non-pressure chronic ulcer of other part of left lower leg with fat layer 08/27/2019 No Yes exposed [place] (primary) hypertension 08/27/2019 No Yes Z79.01 Long term (current) use of anticoagulants 08/27/2019 No Yes Inactive Problems Resolved Problems Electronic Signature(s) Signed: 11/19/2019 1:56:42 PM By: Worthy Keeler PA-C Entered By: Worthy Keeler on 11/19/2019 13:56:42 [place], [place] ([id number]) -------------------------------------------------------------------------------- Progress Note Details Patient Name: Martha Downs L. Date of Service: 11/19/2019 1:30 PM Medical Record Number: [id number] Patient Account Number: [id number] Date of  Birth/Sex: 10/13/1955 (64 y.o. F) Treating RN: Montey Hora Primary Care Provider: Neta Ehlers Other Clinician: Referring Provider: Neta Ehlers Treating Provider/Extender: Melburn Hake, Arnett Galindez Weeks in Treatment: 12 Subjective Chief Complaint Information obtained from Patient Bilateral LE Lymphedema and ulcers History of Present Illness (HPI) 08/27/2019 patient presents today for initial evaluation here in our clinic concerning issues that she has been having with her bilateral lower extremities with lymphedema and weeping. She is previously been a patient over the past year of Dr. Jerilee Hoh at the wound care center in [facility]. With that being said she subsequently was recommended by the lymphedema clinic here in [place] to follow-up with [place] here instead of going back to [place] since they were in closer contact with the clinic here than they are obviously. With that being said the patient states that is why she came her way in order to be evaluated at this point. Fortunately there is no signs of active infection at this time. No fevers, chills, nausea, vomiting, or diarrhea. The patient does have a history of lymphedema, hypertension, long-term use of anticoagulant therapy, and obstructive sleep  apnea. During the visit today and from checking into the discharge she was prone to unexpectedly falling asleep. I did discuss this with her daughter as well who states that this is very common for her she states she is not really sure how compliant her mother is with her CPAP machine that may be part of the issue. The patient did have ABIs performed which revealed have copies of his well today that was on 05/01/2019 and it showed a right ABI of 1.17 with a TBI of 1.12 and a left ABI of 1.24 with a TBI of 0.96. She has had 2 transfusions in the past 2 weeks apparently as well. 08/31/2019 upon evaluation today patient actually appears to be doing somewhat better in regard to her  lower extremity ulcers. She still has a tremendous amount of drainage from the left lower extremity unfortunately but her right lower extremity is doing much better which is great news. Overall I am very pleased in this regard. With regard to home health we finally got things settled so home health will be coming out to see her this would be very beneficial in my opinion as I think they will be able to help her tremendously with more frequent dressing changes. 12/28-Patient is back here at 1 week visit, with regard to her bilateral lower extremity ulcers, she has a significant degree of lymphedema, significant degree of drainage more from the left and which continues to be an issue, patient had not been using her lymphedema pumps as recommended, she is concerned about amount of drainage and how quickly it soaks the dressing, she is concerned about the lymphedema pump sticking to her skin and was told to use it on the wraps. She will get in touch with her cardiologist about adjusting her Lasix dose. 09/14/2019 on evaluation today patient appears to be doing poorly in regard to her lower extremities especially left lower extremity. Unfortunately she is continue to have a significant amount of drainage which is preventing her from being able to heal keeping the wounds very macerated. She also has some warmth to touch in the anterior to medial portion of the left lower extremity compared to the remainder of the wound which does have made somewhat concerned about the possibility of cellulitis. Obviously it can be difficult to determine just strictly lymphedema versus cellulitis but right now I am somewhat concerned. She tells me that she can put a wrap on and at least has to change this twice a day or else she will be walking around leaving puddles of water behind her. Obviously this is fairly significant. She did see her primary care provider they did not see anything going on that they felt needed to be  addressed from a diuretic standpoint but nonetheless I am still somewhat concerned based on what we are seeing to be honest. The patient and her husband are likewise concerned. 09/17/2019 patient is seen today in follow-up I last saw her on Tuesday at that point based on the severity of her lower extremity edema, weeping, and erythema I did send her to the ER for further evaluation and treatment. She subsequently did go for that evaluation. It did appear that based on my review of the notes this is dated 09/14/2019 that the patient did have a BNP of 149.4. Which was notable. Subsequently she was given a dose of IV vancomycin as well as doxycycline and Keflex. It appears however that she was discharged on oral clindamycin 150 mg every 6 hours  and to be honest compared to what I saw on Tuesday she actually seems to be doing somewhat better. Fortunately there is no evidence of active infection at this time which is good news. In the interim she did yesterday see her cardiologist as well Dr. Bettina Gavia. That was on 09/16/2019. Upon review of this note it does appear that his recommendation at this point was to increase the furosemide to 40 mg 2 times a day. He also recommend an increase of potassium 10 mg taken 2 times a day. His other recommendation was that the patient needs to have a follow-up with her hematology oncologist in order to see if they can do anything about iron transfusions for her as unfortunately her iron is still quite low and obviously oral supplementation is not going to be the best way to get this up. 09/24/2019 upon evaluation today patient appears to be doing quite well with regard to her lower extremities. The right lower extremity in particular has dramatically improved to be honest so has the left in my opinion. There does not appear to be any signs of active infection which is good news. No fevers, chills, nausea, vomiting, or diarrhea. Patient is still taking the clindamycin at this  time. 10/01/2019 on evaluation today patient appears to be showing some signs of improvement at this point in regard to her lower extremities. The right lower extremity is doing quite well the left lower extremity is still draining but again I do believe this is significantly better compared to what it was in the past. Fortunately there is no evidence of active infection at this time. No fever chills noted 10/08/2019 upon evaluation today patient appears to have smaller areas of erythema and weeping on the left lower extremity as compared to last week's evaluation. The right leg is doing excellent. With that being said unfortunately she is still having a lot of significant swelling on the left in fact the swelling is worse today than it was last week. With that being said she is telling me that she is able to keep her wraps on now for at least the day sometimes even up to 30 hours before they are having to be changed. When they do have to change it they are using her lymphedema wraps at home [place], [place]. ([name]) to reapply what they can. She only changes that she tells me when it actually gets to the point that is draining so much that it leaks onto her floor. 10/15/2019 upon evaluation today patient appears to actually be doing very well with regard to her lower extremities in fact I believe the right lower extremity may be completely sealed up that we will likely watch it 1 more week. The left lower extremity is doing better she does not seem to be having as much weeping and leaking but still this is going on. Home health still has not gotten the [place] there is still just using ABD pads I believe was able to get [name] this would actually help out. 10/21/2019 upon evaluation today patient appears to be doing much better in regard to her lower extremities bilaterally. The right lower extremity has 1 area remaining that still draining the left lower extremity is still draining but not nearly  what it was in the past. Overall I am extremely pleased with how things seem to be progressing. 10/29/2019 upon evaluation today patient actually appears to be doing quite well in regard to her lower extremities bilaterally both extremities show signs  of improvement. The biggest issue we seem to be having is that unfortunately the home health nurse does not seem to be wrapping this quite right in that it seems to slide down within just a few hours of them coming out to rewrap her according to the patient and her husband. The wraps we put on the state last much longer in fact generally until they are taken off. Fortunately there is no signs of infection although unfortunately she does still have a few open areas on both lower extremities at this time. 11/12/2019 upon evaluation today patient appears to be doing excellent in regard to her wounds. She does not have weeping like she is had in the past and overall I feel like she is doing quite well. With that being said this is something that has continued to give her trouble and that she states that the wrap is sliding down especially when home health puts this on. She tells me that the ones we put on do not seem to be sliding down at this point. Nonetheless I think that this is definitely something of concern. Obviously if it slides down can cause trouble with getting the area to effectively heal and continue to do well. 11/19/2019 upon evaluation today patient actually appears to be doing very well with regard to her bilateral lower extremities. She just has a couple small areas 1 on the right 2 on the left that are still open at this point and all seem to be headed in the right direction. She is no longer saturating her wraps which is also good news. Overall I am extremely pleased with the progress that is been made up to this point. I do believe we may be able to switch into her lymphedema wraps at this point. Objective Constitutional Well-nourished  and well-hydrated in no acute distress. Vitals Time Taken: 1:42 PM, Height: 62 in, Weight: 290 lbs, BMI: 53, Temperature: 98.3 F, Pulse: 105 bpm, Respiratory Rate: 16 breaths/min, Blood Pressure: 139/73 mmHg. Respiratory normal breathing without difficulty. Psychiatric this patient is able to make decisions and demonstrates good insight into disease process. Alert and Oriented x 3. pleasant and cooperative. General Notes: Upon inspection patient's wounds actually seem to be doing quite well and I do believe that we can likely switch to using her lymphedema wraps and then subsequently we will have her use the alginate dressings to the open wound locations underneath her wraps of anything from draining through. Integumentary (Hair, Skin) Wound #1 status is Open. Original cause of wound was Gradually Appeared. The wound is located on the Left,Medial,Anterior Lower Leg. The wound measures 3cm length x 5cm width x 0.2cm depth; 11.781cm^2 area and 2.356cm^3 volume. There is Fat Layer (Subcutaneous Tissue) Exposed exposed. There is a large amount of serous drainage noted. The wound margin is indistinct and nonvisible. There is medium (34-66%) red granulation within the wound bed. There is a medium (34-66%) amount of necrotic tissue within the wound bed including Adherent Slough. Wound #2 status is Healed - Epithelialized. Original cause of wound was Gradually Appeared. The wound is located on the Right,Lateral Lower Leg. The wound measures 0cm length x 0cm width x 0cm depth; 0cm^2 area and 0cm^3 volume. There is Fat Layer (Subcutaneous Tissue) Exposed exposed. There is a medium amount of serous drainage noted. The wound margin is flat and intact. There is no granulation within the wound bed. There is a large (67-100%) amount of necrotic tissue within the wound bed including Eschar. Wound #3  status is Healed - Epithelialized. Original cause of wound was Gradually Appeared. The wound is located on the  Left,Lateral Lower Leg. The wound measures 0cm length x 0cm width x 0cm depth; 0cm^2 area and 0cm^3 volume. There is Fat Layer (Subcutaneous Tissue) Exposed exposed. There is a large amount of serous drainage noted. The wound margin is flat and intact. There is large (67-100%) red granulation within the wound bed. There is a small (1-33%) amount of necrotic tissue within the wound bed including Adherent Slough. [name], [name] ([id number]) Wound #4 status is Open. Original cause of wound was Gradually Appeared. The wound is located on the Right,Posterior Lower Leg. The wound measures 0.1cm length x 0.1cm width x 0.1cm depth; 0.008cm^2 area and 0.001cm^3 volume. There is a medium amount of serous drainage noted. The wound margin is flat and intact. There is no granulation within the wound bed. There is a large (67-100%) amount of necrotic tissue within the wound bed including Eschar and Adherent Slough. Wound #5 status is Open. Original cause of wound was Gradually Appeared. The wound is located on the Left,Proximal Lower Leg. The wound measures 0.1cm length x 0.1cm width x 0.1cm depth; 0.008cm^2 area and 0.001cm^3 volume. The wound is limited to skin breakdown. There is a large amount of serous drainage noted. The wound margin is flat and intact. There is no granulation within the wound bed. There is no necrotic tissue within the wound bed. Assessment Active Problems ICD-10 Lymphedema, not elsewhere classified Non-pressure chronic ulcer of other part of right lower leg with fat layer exposed Non-pressure chronic ulcer of other part of left lower leg with fat layer exposed Essential (primary) hypertension Long term (current) use of anticoagulants Plan Wound Cleansing: Wound #1 Left,Medial,Anterior Lower Leg: Cleanse wound with mild soap and water Dial antibacterial soap, wash wounds, rinse and pat dry prior to dressing wounds May Shower, gently pat wound dry prior to applying new  dressing. Wound #4 Right,Posterior Lower Leg: Cleanse wound with mild soap and water Dial antibacterial soap, wash wounds, rinse and pat dry prior to dressing wounds May Shower, gently pat wound dry prior to applying new dressing. Wound #5 Left,Proximal Lower Leg: Cleanse wound with mild soap and water Dial antibacterial soap, wash wounds, rinse and pat dry prior to dressing wounds May Shower, gently pat wound dry prior to applying new dressing. Skin Barriers/Peri-Wound Care: Triamcinolone Acetonide Ointment (TCA) - to either leg on places that itch - ask patient Primary Wound Dressing: Wound #1 Left,Medial,Anterior Lower Leg: Silver Alginate Wound #4 Right,Posterior Lower Leg: Silver Alginate Wound #5 Left,Proximal Lower Leg: Silver Alginate Secondary Dressing: Wound #1 Left,Medial,Anterior Lower Leg: ABD pad - conform and tape Wound #4 Right,Posterior Lower Leg: ABD pad - conform and tape Wound #5 Left,Proximal Lower Leg: ABD pad - conform and tape Dressing Change Frequency: Wound #1 Left,Medial,Anterior Lower Leg: Change Dressing Monday, Wednesday, Friday - Friday in office Wound #4 Right,Posterior Lower Leg: Change Dressing Monday, Wednesday, Friday - Friday in office Wound #5 Left,Proximal Lower Leg: Change Dressing Monday, Wednesday, Friday - Friday in office Follow-up Appointments: Return Appointment in 1 week. [name], [name] Edema Control: Wound #1 Left,Medial,Anterior Lower Leg: Patient to wear own compression stockings - Lymphedema wraps Elevate legs to the level of the heart and pump ankles as often as possible Compression Pump: Use compression pump on left lower extremity for 60 minutes, twice daily. Compression Pump: Use compression pump on right lower extremity for 60 minutes, twice daily. Wound #4 Right,Posterior  Lower Leg: Patient to wear own compression stockings - Lymphedema wraps Elevate legs to the level of the heart and pump ankles as  often as possible Compression Pump: Use compression pump on left lower extremity for 60 minutes, twice daily. Compression Pump: Use compression pump on right lower extremity for 60 minutes, twice daily. Wound #5 Left,Proximal Lower Leg: Patient to wear own compression stockings - Lymphedema wraps Elevate legs to the level of the heart and pump ankles as often as possible Compression Pump: Use compression pump on left lower extremity for 60 minutes, twice daily. Compression Pump: Use compression pump on right lower extremity for 60 minutes, twice daily. Home Health: Wound #1 Left,Medial,Anterior Lower Leg: D/C Home Health Services Wound #4 Right,Posterior Lower Leg: D/C Home Health Services Wound #5 Left,Proximal Lower Leg: D/C Home Health Services 1. I would recommend currently that we going continue with the silver alginate dressing to all open wound locations followed by an ABD pad I think that is done well for her at this point. 2. I am also going to recommend that we have her continue to use the Dial antibacterial soap to wash her wounds with I think this is good at this point as well. 3. I am also can recommend that she start using her lymphedema wraps for the bilateral lower extremities her husband applies these for her they have been trained by the lymphedema clinic previous. Number form also can recommend at this time that the patient continue and in fact initiate the use of her compression pumps as soon as possible obviously I think this can be beneficial as well. We will see patient back for reevaluation in 1 week here in the clinic. If anything worsens or changes patient will contact our office for additional recommendations. Electronic Signature(s) Signed: 11/19/2019 3:26:20 PM By: Worthy Keeler PA-C Entered By: Worthy Keeler on 11/19/2019 15:26:20 [name], [name] ([name]) -------------------------------------------------------------------------------- SuperBill  Details Patient Name: [name]. Date of Service: 11/19/2019 Medical Record Number: [name] Patient Account Number: [id number] Date of Birth/Sex: Jun 04, 1956 (64 y.o. F) Treating RN: Montey Hora Primary Care Provider: Neta Ehlers Other Clinician: Referring Provider: Neta Ehlers Treating Provider/Extender: Melburn Hake, Khiana Camino Weeks in Treatment: 12 Diagnosis Coding ICD-10 Codes Code Description I89.0 Lymphedema, not elsewhere classified [id number] Non-pressure chronic ulcer of other part of right lower leg with fat layer exposed L97.822 Non-pressure chronic ulcer of other part of left lower leg with fat layer exposed Corn (primary) hypertension Z79.01 Long term (current) use of anticoagulants Facility Procedures CPT4 Code: [id number] Description: [id number] - WOUND CARE VISIT-LEV 5 EST PT Modifier: Quantity: 1 Physician Procedures CPT4 Code: [id number] Description: [id number] - WC PHYS LEVEL 3 - EST PT Modifier: Quantity: 1 CPT4 Code: Description: ICD-10 Diagnosis Description I89.0 Lymphedema, not elsewhere classified [id number] Non-pressure chronic ulcer of other part of right lower leg with fat layer L97.822 Non-pressure chronic ulcer of other part of left lower leg with fat layer e  I10 Essential (primary) hypertension Modifier: exposed xposed Quantity: Electronic Signature(s) Signed: 11/19/2019 3:26:31 PM By: Worthy Keeler PA-C Entered By: Worthy Keeler on 11/19/2019 15:26:31

## 2019-11-22 ENCOUNTER — Ambulatory Visit (INDEPENDENT_AMBULATORY_CARE_PROVIDER_SITE_OTHER): Payer: Medicare Other | Admitting: Cardiovascular Disease

## 2019-11-22 DIAGNOSIS — Z7901 Long term (current) use of anticoagulants: Secondary | ICD-10-CM

## 2019-11-22 DIAGNOSIS — G459 Transient cerebral ischemic attack, unspecified: Secondary | ICD-10-CM

## 2019-11-22 DIAGNOSIS — Z954 Presence of other heart-valve replacement: Secondary | ICD-10-CM

## 2019-11-22 LAB — POCT INR: INR: 1.7 — AB (ref 2.0–3.0)

## 2019-11-23 NOTE — Progress Notes (Signed)
[name], Martha ([id number]) Visit Report for 11/19/2019 Arrival Information Details Patient Name: [name], [name]. Date of Service: 11/19/2019 1:30 PM Medical Record Number: [id number] Patient Account Number: [id number] Date of Birth/Sex: [birth year] (64 y.o. F) Treating RN: Army Melia Primary Care Netta Fodge: Neta Ehlers Other Clinician: Referring Ennis Heavner: Neta Ehlers Treating Henleigh Robello/Extender: Melburn Hake, HOYT Weeks in Treatment: 12 Visit Information History Since Last Visit Added or deleted any medications: No Patient Arrived: Ambulatory Any new allergies or adverse reactions: No Arrival Time: 13:41 Had a fall or experienced change in No Accompanied By: husband activities of daily living that may affect Transfer Assistance: None risk of falls: Patient Identification Verified: Yes Signs or symptoms of abuse/neglect since last visito No Patient Has Alerts: Yes Hospitalized since last visit: No Patient Alerts: Patient on Blood Thinner Has Dressing in Place as Prescribed: Yes warfarin Pain Present Now: No Not Diabetic 04/29/2019 [facility] ABI R)1.17 L)1.24 TBI R) 1.12 L) 0.96 Electronic Signature(s) Signed: 11/23/2019 10:13:19 AM By: Army Melia Entered By: Army Melia on 11/19/2019 13:42:15 Reisner, Ela L. ([id number]) -------------------------------------------------------------------------------- Clinic Level of Care Assessment Details Patient Name: [name]. Date of Service: 11/19/2019 1:30 PM Medical Record Number: [id number] Patient Account Number: [id number] Date of Birth/Sex: 10/10/1955 (64 y.o. F) Treating RN: Montey Hora Primary Care Clista Rainford: Neta Ehlers Other Clinician: Referring Marykathleen Russi: Neta Ehlers Treating Sekou Zuckerman/Extender: Melburn Hake, HOYT Weeks in Treatment: 12 Clinic Level of Care Assessment Items TOOL 4 Quantity Score []  - Use when only an EandM is performed on FOLLOW-UP visit 0 ASSESSMENTS - Nursing Assessment / Reassessment X  - Reassessment of Co-morbidities (includes updates in patient status) 1 10 X- 1 5 Reassessment of Adherence to Treatment Plan ASSESSMENTS - Wound and Skin Assessment / Reassessment []  - Simple Wound Assessment / Reassessment - one wound 0 X- 5 5 Complex Wound Assessment / Reassessment - multiple wounds []  - 0 Dermatologic / Skin Assessment (not related to wound area) ASSESSMENTS - Focused Assessment X - Circumferential Edema Measurements - multi extremities 1 5 []  - 0 Nutritional Assessment / Counseling / Intervention X- 1 5 Lower Extremity Assessment (monofilament, tuning fork, pulses) []  - 0 Peripheral Arterial Disease Assessment (using hand held doppler) ASSESSMENTS - Ostomy and/or Continence Assessment and Care []  - Incontinence Assessment and Management 0 []  - 0 Ostomy Care Assessment and Management (repouching, etc.) PROCESS - Coordination of Care X - Simple Patient / Family Education for ongoing care 1 15 []  - 0 Complex (extensive) Patient / Family Education for ongoing care X- 1 10 Staff obtains Programmer, systems, Records, Test Results / Process Orders []  - 0 Staff telephones HHA, Nursing Homes / Clarify orders / etc []  - 0 Routine Transfer to another Facility (non-emergent condition) []  - 0 Routine Hospital Admission (non-emergent condition) []  - 0 New Admissions / Biomedical engineer / Ordering NPWT, Apligraf, etc. []  - 0 Emergency Hospital Admission (emergent condition) X- 1 10 Simple Discharge Coordination []  - 0 Complex (extensive) Discharge Coordination PROCESS - Special Needs []  - Pediatric / Minor Patient Management 0 []  - 0 Isolation Patient Management []  - 0 Hearing / Language / Visual special needs []  - 0 Assessment of Community assistance (transportation, D/C planning, etc.) Perrault, Reghan L. ([id number]) []  - 0 Additional assistance / Altered mentation []  - 0 Support Surface(s) Assessment (bed, cushion, seat, etc.) INTERVENTIONS - Wound Cleansing /  Measurement []  - Simple Wound Cleansing - one wound 0 X- 5 5 Complex Wound Cleansing - multiple wounds X- 1 5  Wound Imaging (photographs - any number of wounds) []  - 0 Wound Tracing (instead of photographs) []  - 0 Simple Wound Measurement - one wound X- 5 5 Complex Wound Measurement - multiple wounds INTERVENTIONS - Wound Dressings []  - Small Wound Dressing one or multiple wounds 0 X- 2 15 Medium Wound Dressing one or multiple wounds []  - 0 Large Wound Dressing one or multiple wounds []  - 0 Application of Medications - topical []  - 0 Application of Medications - injection INTERVENTIONS - Miscellaneous []  - External ear exam 0 []  - 0 Specimen Collection (cultures, biopsies, blood, body fluids, etc.) []  - 0 Specimen(s) / Culture(s) sent or taken to Lab for analysis []  - 0 Patient Transfer (multiple staff / Civil Service fast streamer / Similar devices) []  - 0 Simple Staple / Suture removal (25 or less) []  - 0 Complex Staple / Suture removal (26 or more) []  - 0 Hypo / Hyperglycemic Management (close monitor of Blood Glucose) []  - 0 Ankle / Brachial Index (ABI) - do not check if billed separately []  - 0 Vital Signs Has the patient been seen at the hospital within the last three years: Yes Total Score: 170 Level Of Care: New/Established - Level 5 Electronic Signature(s) Signed: 11/19/2019 4:04:12 PM By: Montey Hora Entered By: Montey Hora on 11/19/2019 14:25:34 [place]o, [name] ([id number]) -------------------------------------------------------------------------------- Encounter Discharge Information Details Patient Name: Martha Downs L. Date of Service: 11/19/2019 1:30 PM Medical Record Number: [id number] Patient Account Number: [id number] Date of Birth/Sex: July 11, 1956 (64 y.o. F) Treating RN: Montey Hora Primary Care Brylynn Hanssen: Neta Ehlers Other Clinician: Referring Jozette Castrellon: Neta Ehlers Treating Jaeson Molstad/Extender: Sharalyn Ink in Treatment: 12 Encounter  Discharge Information Items Discharge Condition: Stable Ambulatory Status: Ambulatory Discharge Destination: Home Transportation: Private Auto Accompanied By: husband Schedule Follow-up Appointment: Yes Clinical Summary of Care: Electronic Signature(s) Signed: 11/19/2019 4:04:12 PM By: Montey Hora Entered By: Montey Hora on 11/19/2019 [place], [place] ([id number]) -------------------------------------------------------------------------------- Lower Extremity Assessment Details Patient Name: Martha Downs L. Date of Service: 11/19/2019 1:30 PM Medical Record Number: [id number] Patient Account Number: [id number] Date of Birth/Sex: 1955-10-14 (64 y.o. F) Treating RN: Army Melia Primary Care Kiven Vangilder: Neta Ehlers Other Clinician: Referring Marien Manship: Neta Ehlers Treating Stephaie Dardis/Extender: Melburn Hake, HOYT Weeks in Treatment: 12 Edema Assessment Assessed: [Left: No] [Right: No] Edema: [Left: Yes] [Right: Yes] Calf Left: Right: Point of Measurement: 32 cm From Medial Instep 66 cm 45 cm Ankle Left: Right: Point of Measurement: 10 cm From Medial Instep 29 cm 32 cm Vascular Assessment Pulses: Dorsalis Pedis Palpable: [Left:Yes] [Right:Yes] Electronic Signature(s) Signed: 11/23/2019 10:13:19 AM By: Army Melia Entered By: Army Melia on 11/19/2019 13:48:52 [place], [place]. ([id number]) -------------------------------------------------------------------------------- Multi Wound Chart Details Patient Name: Martha Downs L. Date of Service: 11/19/2019 1:30 PM Medical Record Number: [id number] Patient Account Number: [id number] Date of Birth/Sex: 1956-07-17 (64 y.o. F) Treating RN: Montey Hora Primary Care Sophee Mckimmy: Neta Ehlers Other Clinician: Referring Devera Englander: Neta Ehlers Treating Dane Bloch/Extender: Melburn Hake, HOYT Weeks in Treatment: 12 Vital Signs Height(in): 42 Pulse(bpm): 105 Weight(lbs): 290 Blood Pressure(mmHg): 139/73 Body Mass  Index(BMI): 53 Temperature(F): 98.3 Respiratory Rate(breaths/min): 16 Photos: Wound Location: Left, Medial, Anterior Lower Leg Right, Lateral Lower Leg Left, Lateral Lower Leg Wounding Event: Gradually Appeared Gradually Appeared Gradually Appeared Primary Etiology: Lymphedema Lymphedema Lymphedema Comorbid History: Anemia, Coronary Artery Disease, Anemia, Coronary Artery Disease, Anemia, Coronary Artery Disease, Hypertension Hypertension Hypertension Date Acquired: 08/04/2019 08/04/2019 10/22/2019 Weeks of Treatment: 12 12 3  Wound Status: Open Healed -  Epithelialized Healed - Epithelialized Measurements L x W x D (cm) 3x5x0.2 0x0x0 0x0x0 Area (cm) : 11.781 0 0 Volume (cm) : 2.356 0 0 % Reduction in Area: 98.90% 100.00% 100.00% % Reduction in Volume: 97.70% 100.00% 100.00% Classification: Partial Thickness Partial Thickness Partial Thickness Exudate Amount: Large Medium Large Exudate Type: Serous Serous Serous Exudate Color: amber amber amber Wound Margin: Indistinct, nonvisible Flat and Intact Flat and Intact Granulation Amount: Medium (34-66%) None Present (0%) Large (67-100%) Granulation Quality: Red N/A Red Necrotic Amount: Medium (34-66%) Large (67-100%) Small (1-33%) Necrotic Tissue: Adherent [place] Exposed Structures: Fat Layer (Subcutaneous Tissue) Fat Layer (Subcutaneous Tissue) Fat Layer (Subcutaneous Tissue) Exposed: Yes Exposed: Yes Exposed: Yes Fascia: No Fascia: No Fascia: No Tendon: No Tendon: No Tendon: No Muscle: No Muscle: No Muscle: No Joint: No Joint: No Joint: No Bone: No Bone: No Bone: No Epithelialization: Large (67-100%) None Small (1-33%) Wound Number: 4 5 N/A Photos: N/A DHAMAR, HYNDS ([id number]) Wound Location: Right, Posterior Lower Leg Left, Proximal Lower Leg N/A Wounding Event: Gradually Appeared Gradually Appeared N/A Primary Etiology: Lymphedema Lymphedema N/A Comorbid History: Anemia, Coronary Artery  Disease, Anemia, Coronary Artery Disease, N/A Hypertension Hypertension Date Acquired: 10/21/2019 10/21/2019 N/A Weeks of Treatment: 3 3 N/A Wound Status: Open Open N/A Measurements L x W x D (cm) 0.1x0.1x0.1 0.1x0.1x0.1 N/A Area (cm) : 0.008 0.008 N/A Volume (cm) : 0.001 0.001 N/A % Reduction in Area: 96.60% 96.60% N/A % Reduction in Volume: 95.80% 95.80% N/A Classification: Partial Thickness Partial Thickness N/A Exudate Amount: Medium Large N/A Exudate Type: Serous Serous N/A Exudate Color: amber amber N/A Wound Margin: Flat and Intact Flat and Intact N/A Granulation Amount: None Present (0%) None Present (0%) N/A Granulation Quality: N/A N/A N/A Necrotic Amount: Large (67-100%) None Present (0%) N/A Necrotic Tissue: Eschar, Adherent Slough N/A N/A Exposed Structures: Fascia: No Fascia: No N/A Fat Layer (Subcutaneous Tissue) Fat Layer (Subcutaneous Tissue) Exposed: No Exposed: No Tendon: No Tendon: No Muscle: No Muscle: No Joint: No Joint: No Bone: No Bone: No Limited to Skin Breakdown Epithelialization: None None N/A Treatment Notes Electronic Signature(s) Signed: 11/19/2019 4:04:12 PM By: Montey Hora Entered By: Montey Hora on 11/19/2019 14:12:05 Mcdonald, [name] ([id number]) -------------------------------------------------------------------------------- [place] Details Patient Name: [name]. Date of Service: 11/19/2019 1:30 PM Medical Record Number: [id number] Patient Account Number: [id number] Date of Birth/Sex: [birth year] (64 y.o. F) Treating RN: Montey Hora Primary Care Ivor Kishi: Neta Ehlers Other Clinician: Referring Aune Adami: Neta Ehlers Treating Reginald Mangels/Extender: Melburn Hake, HOYT Weeks in Treatment: 12 Active Inactive Abuse / Safety / Falls / Self Care Management Nursing Diagnoses: Potential for falls Goals: Patient will not experience any injury related to falls Date Initiated: 08/27/2019 Target  Resolution Date: 11/20/2019 Goal Status: Active Interventions: Assess fall risk on admission and as needed Notes: Orientation to the Wound Care Program Nursing Diagnoses: Knowledge deficit related to the wound healing center program Goals: Patient/caregiver will verbalize understanding of the [place] Program Date Initiated: 08/27/2019 Target Resolution Date: 11/20/2019 Goal Status: Active Interventions: Provide education on orientation to the wound center Notes: Venous Leg Ulcer Nursing Diagnoses: Potential for venous Insuffiency (use before diagnosis confirmed) Goals: Patient will maintain optimal edema control Date Initiated: 08/27/2019 Target Resolution Date: 11/20/2019 Goal Status: Active Interventions: Assess peripheral edema status every visit. Notes: Wound/Skin Impairment Nursing Diagnoses: Impaired tissue integrity [place], [place] ([id number]) Goals: Ulcer/skin breakdown will heal within 14 weeks Date Initiated: 08/27/2019 Target Resolution Date: 11/20/2019 Goal Status: Active  Interventions: Assess patient/caregiver ability to obtain necessary supplies Assess patient/caregiver ability to perform ulcer/skin care regimen upon admission and as needed Assess ulceration(s) every visit Notes: Electronic Signature(s) Signed: 11/19/2019 4:04:12 PM By: Montey Hora Entered By: Montey Hora on 11/19/2019 14:11:58 Taney, Anjolie L. ([id number]) -------------------------------------------------------------------------------- Pain Assessment Details Patient Name: Martha Downs L. Date of Service: 11/19/2019 1:30 PM Medical Record Number: [id number] Patient Account Number: [id number] Date of Birth/Sex: Dec 13, 1955 (64 y.o. F) Treating RN: Army Melia Primary Care Nikyah Lackman: Neta Ehlers Other Clinician: Referring Romaine Neville: Neta Ehlers Treating Tore Carreker/Extender: Melburn Hake, HOYT Weeks in Treatment: 12 Active Problems Location of Pain Severity and  Description of Pain Patient Has Paino No Site Locations Pain Management and Medication Current Pain Management: Electronic Signature(s) Signed: 11/23/2019 10:13:19 AM By: Army Melia Entered By: Army Melia on 11/19/2019 13:44:41 Melvin, [name] ([id number]) -------------------------------------------------------------------------------- Patient/Caregiver Education Details Patient Name: [name]. Date of Service: 11/19/2019 1:30 PM Medical Record Number: [id number] Patient Account Number: [id number] Date of Birth/Gender: 06-14-1956 (64 y.o. F) Treating RN: Montey Hora Primary Care Physician: Neta Ehlers Other Clinician: Referring Physician: Neta Ehlers Treating Physician/Extender: Sharalyn Ink in Treatment: 12 Education Assessment Education Provided To: Patient and Caregiver Education Topics Provided Venous: Handouts: Other: need for ongoing compression Methods: Explain/Verbal Responses: State content correctly Electronic Signature(s) Signed: 11/19/2019 4:04:12 PM By: Montey Hora Entered By: Montey Hora on 11/19/2019 14:13:57 Raynes, Allyiah L. ([id number]) -------------------------------------------------------------------------------- Wound Assessment Details Patient Name: Martha Downs L. Date of Service: 11/19/2019 1:30 PM Medical Record Number: [id number] Patient Account Number: [id number] Date of Birth/Sex: 03/28/1956 (64 y.o. F) Treating RN: Montey Hora Primary Care Suriah Peragine: Neta Ehlers Other Clinician: Referring Mickenzie Stolar: Neta Ehlers Treating Burnetta Kohls/Extender: Melburn Hake, HOYT Weeks in Treatment: 12 Wound Status Wound Number: 1 Primary Etiology: Lymphedema Wound Location: Left, Medial, Anterior Lower Leg Wound Status: Open Wounding Event: Gradually Appeared Comorbid History: Anemia, Coronary Artery Disease, Hypertension Date Acquired: 08/04/2019 Weeks Of Treatment: 12 Clustered Wound: No Photos Wound  Measurements Length: (cm) 3 Width: (cm) 5 Depth: (cm) 0.2 Area: (cm) 11.781 Volume: (cm) 2.356 % Reduction in Area: 98.9% % Reduction in Volume: 97.7% Epithelialization: Large (67-100%) Wound Description Classification: Partial Thickness Wound Margin: Indistinct, nonvisible Exudate Amount: Large Exudate Type: Serous Exudate Color: amber Wound Bed Granulation Amount: Medium (34-66%) Exposed Structure Granulation Quality: Red Fascia Exposed: No Necrotic Amount: Medium (34-66%) Fat Layer (Subcutaneous Tissue) Exposed: Yes Necrotic Quality: Adherent Slough Tendon Exposed: No Muscle Exposed: No Joint Exposed: No Bone Exposed: No Treatment Notes Wound #1 (Left, Medial, Anterior Lower Leg) Notes silvercel, abd, conform and Tubi G Electronic Signature(s) Signed: 11/19/2019 4:04:12 PM By: Jearld Shines, [name] ([id number]) Entered By: Montey Hora on 11/19/2019 14:10:51 Caruthers, [place] L. ([id number]) -------------------------------------------------------------------------------- Wound Assessment Details Patient Name: Martha Downs L. Date of Service: 11/19/2019 1:30 PM Medical Record Number: [id number] Patient Account Number: [id number] Date of Birth/Sex: 1955/11/21 (64 y.o. F) Treating RN: Montey Hora Primary Care Jaise Moser: Neta Ehlers Other Clinician: Referring Riva Sesma: Neta Ehlers Treating Jilliann Subramanian/Extender: Melburn Hake, HOYT Weeks in Treatment: 12 Wound Status Wound Number: 2 Primary Etiology: Lymphedema Wound Location: Right, Lateral Lower Leg Wound Status: Healed - Epithelialized Wounding Event: Gradually Appeared Comorbid History: Anemia, Coronary Artery Disease, Hypertension Date Acquired: 08/04/2019 Weeks Of Treatment: 12 Clustered Wound: No Photos Wound Measurements Length: (cm) 0 Width: (cm) 0 Depth: (cm) 0 Area: (cm) Volume: (cm) % Reduction in Area: 100% % Reduction in Volume: 100% Epithelialization: None 0 0 Wound  Description  Classification: Partial Thickness Wound Margin: Flat and Intact Exudate Amount: Medium Exudate Type: Serous Exudate Color: amber Foul Odor After Cleansing: No Slough/Fibrino Yes Wound Bed Granulation Amount: None Present (0%) Exposed Structure Necrotic Amount: Large (67-100%) Fascia Exposed: No Necrotic Quality: Eschar Fat Layer (Subcutaneous Tissue) Exposed: Yes Tendon Exposed: No Muscle Exposed: No Joint Exposed: No Bone Exposed: No Electronic Signature(s) Signed: 11/19/2019 4:04:12 PM By: Montey Hora Entered By: Montey Hora on 11/19/2019 14:10:52 [place], [place] L. ([id number]) -------------------------------------------------------------------------------- Wound Assessment Details Patient Name: Martha Downs L. Date of Service: 11/19/2019 1:30 PM Medical Record Number: [id number] Patient Account Number: [id number] Date of Birth/Sex: [birth year] (64 y.o. F) Treating RN: Montey Hora Primary Care Shriley Joffe: Neta Ehlers Other Clinician: Referring Eyal Greenhaw: Neta Ehlers Treating Evalynn Hankins/Extender: Melburn Hake, HOYT Weeks in Treatment: 12 Wound Status Wound Number: 3 Primary Etiology: Lymphedema Wound Location: Left, Lateral Lower Leg Wound Status: Healed - Epithelialized Wounding Event: Gradually Appeared Comorbid History: Anemia, Coronary Artery Disease, Hypertension Date Acquired: 10/22/2019 Weeks Of Treatment: 3 Clustered Wound: No Photos Wound Measurements Length: (cm) Width: (cm) Depth: (cm) Area: (cm) Volume: (cm) 0 % Reduction in Area: 100% 0 % Reduction in Volume: 100% 0 Epithelialization: Small (1-33%) 0 0 Wound Description Classification: Partial Thickness Wound Margin: Flat and Intact Exudate Amount: Large Exudate Type: Serous Exudate Color: amber Foul Odor After Cleansing: No Slough/Fibrino Yes Wound Bed Granulation Amount: Large (67-100%) Exposed Structure Granulation Quality: Red Fascia Exposed: No Necrotic Amount:  Small (1-33%) Fat Layer (Subcutaneous Tissue) Exposed: Yes Necrotic Quality: Adherent Slough Tendon Exposed: No Muscle Exposed: No Joint Exposed: No Bone Exposed: No Electronic Signature(s) Signed: 11/19/2019 4:04:12 PM By: Montey Hora Entered By: Montey Hora on 11/19/2019 14:11:25 [place], [place] ([id number]) -------------------------------------------------------------------------------- Wound Assessment Details Patient Name: Martha Downs L. Date of Service: 11/19/2019 1:30 PM Medical Record Number: [id number] Patient Account Number: [id number] Date of Birth/Sex: 10-01-1955 (64 y.o. F) Treating RN: Montey Hora Primary Care Ladonte Verstraete: Neta Ehlers Other Clinician: Referring Finnlee Silvernail: Neta Ehlers Treating Caera Enwright/Extender: Melburn Hake, HOYT Weeks in Treatment: 12 Wound Status Wound Number: 4 Primary Etiology: Lymphedema Wound Location: Right, Posterior Lower Leg Wound Status: Open Wounding Event: Gradually Appeared Comorbid History: Anemia, Coronary Artery Disease, Hypertension Date Acquired: 10/21/2019 Weeks Of Treatment: 3 Clustered Wound: No Photos Wound Measurements Length: (cm) 0.1 Width: (cm) 0.1 Depth: (cm) 0.1 Area: (cm) 0.008 Volume: (cm) 0.001 % Reduction in Area: 96.6% % Reduction in Volume: 95.8% Epithelialization: None Wound Description Classification: Partial Thickness Fo Wound Margin: Flat and Intact Sl Exudate Amount: Medium Exudate Type: Serous Exudate Color: amber ul Odor After Cleansing: No ough/Fibrino Yes Wound Bed Granulation Amount: None Present (0%) Exposed Structure Necrotic Amount: Large (67-100%) Fascia Exposed: No Necrotic Quality: Eschar, Adherent Slough Fat Layer (Subcutaneous Tissue) Exposed: No Tendon Exposed: No Muscle Exposed: No Joint Exposed: No Bone Exposed: No Treatment Notes Wound #4 (Right, Posterior Lower Leg) Notes silvercel, abd, conform and Tubi G Electronic Signature(s) Signed: 11/19/2019 4:04:12  PM By: Jearld Shines, [name] ([id number]) Entered By: Montey Hora on 11/19/2019 14:10:53 [place], [place] ([id number]) -------------------------------------------------------------------------------- Wound Assessment Details Patient Name: Martha Downs L. Date of Service: 11/19/2019 1:30 PM Medical Record Number: [id number] Patient Account Number: [id number] Date of Birth/Sex: 11-08-1955 (64 y.o. F) Treating RN: Montey Hora Primary Care Kerissa Coia: Neta Ehlers Other Clinician: Referring Cherisa Brucker: Neta Ehlers Treating Vada Swift/Extender: Melburn Hake, HOYT Weeks in Treatment: 12 Wound Status Wound Number: 5 Primary Etiology: Lymphedema Wound Location: Left, Proximal Lower Leg Wound Status: Open Wounding  Event: Gradually Appeared Comorbid History: Anemia, Coronary Artery Disease, Hypertension Date Acquired: 10/21/2019 Weeks Of Treatment: 3 Clustered Wound: No Photos Wound Measurements Length: (cm) 0.1 % Red Width: (cm) 0.1 % Red Depth: (cm) 0.1 Epith Area: (cm) 0.008 Volume: (cm) 0.001 uction in Area: 96.6% uction in Volume: 95.8% elialization: None Wound Description Classification: Partial Thickness Foul Wound Margin: Flat and Intact Sloug Exudate Amount: Large Exudate Type: Serous Exudate Color: amber Odor After Cleansing: No h/Fibrino No Wound Bed Granulation Amount: None Present (0%) Exposed Structure Necrotic Amount: None Present (0%) Fascia Exposed: No Fat Layer (Subcutaneous Tissue) Exposed: No Tendon Exposed: No Muscle Exposed: No Joint Exposed: No Bone Exposed: No Limited to Skin Breakdown Treatment Notes Wound #5 (Left, Proximal Lower Leg) Notes silvercel, abd, conform and [employer]) DIJONAY, DIVAN ([id number]) Signed: 11/19/2019 4:04:12 PM By: Montey Hora Entered By: Montey Hora on 11/19/2019 14:11:49 [place], [place]  ([id number]) -------------------------------------------------------------------------------- Vitals Details Patient Name: Martha Downs L. Date of Service: 11/19/2019 1:30 PM Medical Record Number: [id number] Patient Account Number: [id number] Date of Birth/Sex: 07-28-1956 (64 y.o. F) Treating RN: Army Melia Primary Care Flemon Kelty: Neta Ehlers Other Clinician: Referring Trevelle Mcgurn: Neta Ehlers Treating Felicity Penix/Extender: Melburn Hake, HOYT Weeks in Treatment: 12 Vital Signs Time Taken: 13:42 Temperature (F): 98.3 Height (in): 62 Pulse (bpm): 105 Weight (lbs): 290 Respiratory Rate (breaths/min): 16 Body Mass Index (BMI): 53 Blood Pressure (mmHg): 139/73 Reference Range: 80 - 120 mg / dl Electronic Signature(s) Signed: 11/23/2019 10:13:19 AM By: Army Melia Entered By: Army Melia on 11/19/2019 13:44:07

## 2019-11-26 ENCOUNTER — Ambulatory Visit: Payer: Medicare Other | Admitting: Physician Assistant

## 2019-11-30 ENCOUNTER — Other Ambulatory Visit: Payer: Self-pay

## 2019-11-30 ENCOUNTER — Ambulatory Visit (INDEPENDENT_AMBULATORY_CARE_PROVIDER_SITE_OTHER): Payer: Medicare Other | Admitting: *Deleted

## 2019-11-30 DIAGNOSIS — G459 Transient cerebral ischemic attack, unspecified: Secondary | ICD-10-CM | POA: Diagnosis not present

## 2019-11-30 DIAGNOSIS — Z954 Presence of other heart-valve replacement: Secondary | ICD-10-CM

## 2019-11-30 DIAGNOSIS — Z7901 Long term (current) use of anticoagulants: Secondary | ICD-10-CM

## 2019-11-30 LAB — POCT INR: INR: 3.6 — AB (ref 2.0–3.0)

## 2019-11-30 NOTE — Patient Instructions (Signed)
Hold warfarin tonight then decrease dose to 1/2 tablet daily except 1 tablet on Mondays, Wednesdays and Fridays.  Repeat INR in 1 week

## 2019-12-03 ENCOUNTER — Ambulatory Visit: Payer: Medicare Other | Admitting: Physician Assistant

## 2019-12-08 ENCOUNTER — Ambulatory Visit (INDEPENDENT_AMBULATORY_CARE_PROVIDER_SITE_OTHER): Payer: Medicare Other | Admitting: Pharmacist

## 2019-12-08 ENCOUNTER — Other Ambulatory Visit: Payer: Self-pay

## 2019-12-08 DIAGNOSIS — Z954 Presence of other heart-valve replacement: Secondary | ICD-10-CM | POA: Diagnosis not present

## 2019-12-08 DIAGNOSIS — Z7901 Long term (current) use of anticoagulants: Secondary | ICD-10-CM | POA: Diagnosis not present

## 2019-12-08 DIAGNOSIS — G459 Transient cerebral ischemic attack, unspecified: Secondary | ICD-10-CM | POA: Diagnosis not present

## 2019-12-08 LAB — POCT INR: INR: 2.8 (ref 2.0–3.0)

## 2019-12-09 ENCOUNTER — Encounter: Payer: Medicare Other | Attending: Physician Assistant | Admitting: Physician Assistant

## 2019-12-09 DIAGNOSIS — I890 Lymphedema, not elsewhere classified: Secondary | ICD-10-CM | POA: Diagnosis not present

## 2019-12-09 DIAGNOSIS — L97822 Non-pressure chronic ulcer of other part of left lower leg with fat layer exposed: Secondary | ICD-10-CM | POA: Insufficient documentation

## 2019-12-09 DIAGNOSIS — L97812 Non-pressure chronic ulcer of other part of right lower leg with fat layer exposed: Secondary | ICD-10-CM | POA: Insufficient documentation

## 2019-12-09 DIAGNOSIS — Z7901 Long term (current) use of anticoagulants: Secondary | ICD-10-CM | POA: Insufficient documentation

## 2019-12-09 DIAGNOSIS — I10 Essential (primary) hypertension: Secondary | ICD-10-CM | POA: Insufficient documentation

## 2019-12-09 NOTE — Progress Notes (Addendum)
Martha, [name] ([id number]) Visit Report for 12/09/2019 Arrival Information Details Patient Name: [name], [name]. Date of Service: 12/09/2019 3:00 PM Medical Record Number: [id number] Patient Account Number: [id number] Date of Birth/Sex: December 04, 1955 (64 y.o. F) Treating RN: Montey Hora Primary Care Analiza Cowger: Neta Ehlers Other Clinician: Referring Laurna Shetley: Neta Ehlers Treating Isiaha Greenup/Extender: Melburn Hake, HOYT Weeks in Treatment: 14 Visit Information History Since Last Visit Added or deleted any medications: No Patient Arrived: Ambulatory Any new allergies or adverse reactions: No Arrival Time: 15:13 Had a fall or experienced change in No Accompanied By: husband activities of daily living that may affect Transfer Assistance: None risk of falls: Patient Has Alerts: Yes Signs or symptoms of abuse/neglect since last visito No Patient Alerts: Patient on Blood Thinner Hospitalized since last visit: No warfarin Implantable device outside of the clinic excluding No Not Diabetic cellular tissue based products placed in the center 04/29/2019 [facility] since last visit: ABI R)1.17 L)1.24 TBI R) 1.12 L) 0.96 Has Dressing in Place as Prescribed: Yes Has Compression in Place as Prescribed: Yes Pain Present Now: No Electronic Signature(s) Signed: 12/09/2019 4:50:17 PM By: Montey Hora Entered By: Montey Hora on 12/09/2019 15:13:46 [place], [place] ([id number]) -------------------------------------------------------------------------------- Compression Therapy Details Patient Name: Martha Downs L. Date of Service: 12/09/2019 3:00 PM Medical Record Number: [id number] Patient Account Number: [id number] Date of Birth/Sex: 1956/03/18 (64 y.o. F) Treating RN: Army Melia Primary Care Rad Gramling: Neta Ehlers Other Clinician: Referring Francyne Arreaga: Neta Ehlers Treating Tannie Koskela/Extender: Melburn Hake, HOYT Weeks in Treatment: 14 Compression Therapy Performed for Wound Assessment:  Wound #1 Left,Medial,Anterior Lower Leg Performed By: Clinician Army Melia, RN Compression Type: Four Layer Post Procedure Diagnosis Same as Pre-procedure Electronic Signature(s) Signed: 12/09/2019 3:58:38 PM By: Army Melia Entered By: Army Melia on 12/09/2019 15:40:11 [place], [place] ([id number]) -------------------------------------------------------------------------------- Compression Therapy Details Patient Name: Martha Downs L. Date of Service: 12/09/2019 3:00 PM Medical Record Number: [id number] Patient Account Number: [id number] Date of Birth/Sex: 02-07-1956 (64 y.o. F) Treating RN: Army Melia Primary Care Javious Hallisey: Neta Ehlers Other Clinician: Referring Breonia Kirstein: Neta Ehlers Treating Tura Roller/Extender: Melburn Hake, HOYT Weeks in Treatment: 14 Compression Therapy Performed for Wound Assessment: Wound #4 Right,Posterior Lower Leg Performed By: Clinician Army Melia, RN Compression Type: Four Layer Post Procedure Diagnosis Same as Pre-procedure Electronic Signature(s) Signed: 12/09/2019 3:58:38 PM By: Army Melia Entered By: Army Melia on 12/09/2019 15:40:11 Guevarra, Adrian ([id number]) -------------------------------------------------------------------------------- Compression Therapy Details Patient Name: Martha Downs L. Date of Service: 12/09/2019 3:00 PM Medical Record Number: [id number] Patient Account Number: [id number] Date of Birth/Sex: Mar 19, 1956 (64 y.o. F) Treating RN: Army Melia Primary Care Summerlyn Fickel: Neta Ehlers Other Clinician: Referring Emeree Mahler: Neta Ehlers Treating Viera Okonski/Extender: Melburn Hake, HOYT Weeks in Treatment: 14 Compression Therapy Performed for Wound Assessment: Wound #5 Left,Proximal Lower Leg Performed By: Clinician Army Melia, RN Compression Type: Four Layer Post Procedure Diagnosis Same as Pre-procedure Electronic Signature(s) Signed: 12/09/2019 3:58:38 PM By: Army Melia Entered By: Army Melia on 12/09/2019  15:40:11 Catanese, [name] ([id number]) -------------------------------------------------------------------------------- Encounter Discharge Information Details Patient Name: Martha Downs L. Date of Service: 12/09/2019 3:00 PM Medical Record Number: [id number] Patient Account Number: [id number] Date of Birth/Sex: Jul 04, 1956 (64 y.o. F) Treating RN: Army Melia Primary Care Dai Mcadams: Neta Ehlers Other Clinician: Referring [name]: Neta Ehlers Treating Margreat Widener/Extender: Melburn Hake, HOYT Weeks in Treatment: 14 Encounter Discharge Information Items Discharge Condition: Stable Ambulatory Status: Ambulatory Discharge Destination: Home Transportation: Private Auto Accompanied By: husband Schedule Follow-up Appointment: Yes Clinical Summary of  Care: Electronic Signature(s) Signed: 12/09/2019 3:58:38 PM By: Army Melia Entered By: Army Melia on 12/09/2019 15:42:52 Vallone, [name] ([id number]) -------------------------------------------------------------------------------- Lower Extremity Assessment Details Patient Name: Martha Downs L. Date of Service: 12/09/2019 3:00 PM Medical Record Number: [id number] Patient Account Number: [id number] Date of Birth/Sex: 1955/09/10 (64 y.o. F) Treating RN: Montey Hora Primary Care Nicholad Kautzman: Neta Ehlers Other Clinician: Referring Anorah [name]: Neta Ehlers Treating Mychael Soots/Extender: Melburn Hake, HOYT Weeks in Treatment: 14 Edema Assessment Assessed: [Left: No] [Right: No] Edema: [Left: Yes] [Right: Yes] Calf Left: Right: Point of Measurement: 32 cm From Medial Instep 63 cm 50 cm Ankle Left: Right: Point of Measurement: 10 cm From Medial Instep 31.5 cm 31 cm Vascular Assessment Pulses: Dorsalis Pedis Palpable: [Left:Yes] [Right:Yes] Electronic Signature(s) Signed: 12/09/2019 4:50:17 PM By: Montey Hora Entered By: Montey Hora on 12/09/2019 15:20:26 Wilms, Gelena L.  ([id number]) -------------------------------------------------------------------------------- Multi Wound Chart Details Patient Name: Martha Downs L. Date of Service: 12/09/2019 3:00 PM Medical Record Number: [id number] Patient Account Number: [id number] Date of Birth/Sex: 12/07/1955 (64 y.o. F) Treating RN: Army Melia Primary Care Beckham Capistran: Neta Ehlers Other Clinician: Referring Daveah Varone: Neta Ehlers Treating Jozelynn Danielson/Extender: Melburn Hake, HOYT Weeks in Treatment: 14 Vital Signs Height(in): 65 Pulse(bpm): 60 Weight(lbs): 290 Blood Pressure(mmHg): 134/64 Body Mass Index(BMI): 53 Temperature(F): 98.0 Respiratory Rate(breaths/min): 18 Photos: Wound Location: Left, Medial, Anterior Lower Leg Right, Posterior Lower Leg Left, Proximal Lower Leg Wounding Event: Gradually Appeared Gradually Appeared Gradually Appeared Primary Etiology: Lymphedema Lymphedema Lymphedema Comorbid History: Anemia, Coronary Artery Disease, Anemia, Coronary Artery Disease, Anemia, Coronary Artery Disease, Hypertension Hypertension Hypertension Date Acquired: 08/04/2019 10/21/2019 10/21/2019 Weeks of Treatment: 14 5 5  Wound Status: Open Open Open Measurements L x W x D (cm) 3.4x2.9x0.2 0x0x0 1.1x6x0.1 Area (cm) : 7.744 0 5.184 Volume (cm) : 1.549 0 0.518 % Reduction in Area: 99.20% 100.00% -2096.60% % Reduction in Volume: 98.50% 100.00% -2058.30% Classification: Partial Thickness Partial Thickness Partial Thickness Exudate Amount: Large None Present Large Exudate Type: Serous N/A Serous Exudate Color: amber N/A amber Wound Margin: Indistinct, nonvisible Flat and Intact Flat and Intact Granulation Amount: Small (1-33%) None Present (0%) Large (67-100%) Granulation Quality: Red N/A Pink Necrotic Amount: Large (67-100%) Large (67-100%) None Present (0%) Necrotic Tissue: Adherent Slough Eschar N/A Exposed Structures: Fat Layer (Subcutaneous Tissue) Fascia: No Fat Layer (Subcutaneous  Tissue) Exposed: Yes Fat Layer (Subcutaneous Tissue) Exposed: Yes Fascia: No Exposed: No Fascia: No Tendon: No Tendon: No Tendon: No Muscle: No Muscle: No Muscle: No Joint: No Joint: No Joint: No Bone: No Bone: No Bone: No Limited to Skin Breakdown Epithelialization: Small (1-33%) Large (67-100%) Small (1-33%) Treatment Notes Electronic Signature(s) Signed: 12/09/2019 3:58:38 PM By: Augusto Gamble, [name] ([id number]) Entered By: Army Melia on 12/09/2019 15:33:10 Barse, [name] ([id number]) -------------------------------------------------------------------------------- [place] Details Patient Name: Llewellyn Park, [name]. Date of Service: 12/09/2019 3:00 PM Medical Record Number: [id number] Patient Account Number: [id number] Date of Birth/Sex: 08/18/1956 (64 y.o. F) Treating RN: Army Melia Primary Care Makensie Mulhall: Neta Ehlers Other Clinician: Referring Raihana Balderrama: Neta Ehlers Treating Adi Seales/Extender: Melburn Hake, HOYT Weeks in Treatment: 14 Active Inactive Abuse / Safety / Falls / Self Care Management Nursing Diagnoses: Potential for falls Goals: Patient will not experience any injury related to falls Date Initiated: 08/27/2019 Target Resolution Date: 11/20/2019 Goal Status: Active Interventions: Assess fall risk on admission and as needed Notes: Orientation to the Wound Care Program Nursing Diagnoses: Knowledge deficit related to the wound healing center program Goals: Patient/caregiver will verbalize understanding of  the Wound Healing Center Program Date Initiated: 08/27/2019 Target Resolution Date: 11/20/2019 Goal Status: Active Interventions: Provide education on orientation to the wound center Notes: Venous Leg Ulcer Nursing Diagnoses: Potential for venous Insuffiency (use before diagnosis confirmed) Goals: Patient will maintain optimal edema control Date Initiated: 08/27/2019 Target Resolution Date: 11/20/2019 Goal  Status: Active Interventions: Assess peripheral edema status every visit. Notes: Wound/Skin Impairment Nursing Diagnoses: Impaired tissue integrity Mash, [place] ([id number]) Goals: Ulcer/skin breakdown will heal within 14 weeks Date Initiated: 08/27/2019 Target Resolution Date: 11/20/2019 Goal Status: Active Interventions: Assess patient/caregiver ability to obtain necessary supplies Assess patient/caregiver ability to perform ulcer/skin care regimen upon admission and as needed Assess ulceration(s) every visit Notes: Electronic Signature(s) Signed: 12/09/2019 3:58:38 PM By: Army Melia Entered By: Army Melia on 12/09/2019 15:32:56 [place], [place] ([id number]) -------------------------------------------------------------------------------- Pain Assessment Details Patient Name: Martha Downs L. Date of Service: 12/09/2019 3:00 PM Medical Record Number: [id number] Patient Account Number: [id number] Date of Birth/Sex: 1955-11-29 (64 y.o. F) Treating RN: Montey Hora Primary Care Eustacia Urbanek: Neta Ehlers Other Clinician: Referring Ell Tiso: Neta Ehlers Treating Scotti Kosta/Extender: Melburn Hake, HOYT Weeks in Treatment: 14 Active Problems Location of Pain Severity and Description of Pain Patient Has Paino Yes Site Locations Pain Location: Generalized Pain Pain Management and Medication Current Pain Management: Goals for Pain Management c/o left heel pain Electronic Signature(s) Signed: 12/09/2019 4:50:17 PM By: Montey Hora Entered By: Montey Hora on 12/09/2019 15:14:08 [place]le, [name] ([id number]) -------------------------------------------------------------------------------- Patient/Caregiver Education Details Patient Name: [name]. Date of Service: 12/09/2019 3:00 PM Medical Record Number: [id number] Patient Account Number: [id number] Date of Birth/Gender: [birth year] (64 y.o. F) Treating RN: Army Melia Primary Care Physician: Neta Ehlers Other  Clinician: Referring Physician: Neta Ehlers Treating Physician/Extender: Sharalyn Ink in Treatment: 14 Education Assessment Education Provided To: Patient Education Topics Provided Wound/Skin Impairment: Handouts: Caring for Your Ulcer Methods: Demonstration, Explain/Verbal Responses: State content correctly Electronic Signature(s) Signed: 12/09/2019 3:58:38 PM By: Army Melia Entered By: Army Melia on 12/09/2019 15:42:11 [place], [place] ([id number]) -------------------------------------------------------------------------------- Wound Assessment Details Patient Name: Martha Downs L. Date of Service: 12/09/2019 3:00 PM Medical Record Number: [id number] Patient Account Number: [id number] Date of Birth/Sex: November 04, 1955 (64 y.o. F) Treating RN: Montey Hora Primary Care Donja Tipping: Neta Ehlers Other Clinician: Referring Braylea Brancato: Neta Ehlers Treating Trevia Nop/Extender: Melburn Hake, HOYT Weeks in Treatment: 14 Wound Status Wound Number: 1 Primary Etiology: Lymphedema Wound Location: Left, Medial, Anterior Lower Leg Wound Status: Open Wounding Event: Gradually Appeared Comorbid History: Anemia, Coronary Artery Disease, Hypertension Date Acquired: 08/04/2019 Weeks Of Treatment: 14 Clustered Wound: No Photos Wound Measurements Length: (cm) 3.4 % Re Width: (cm) 2.9 % Re Depth: (cm) 0.2 Epit Area: (cm) 7.744 Tun Volume: (cm) 1.549 Und duction in Area: 99.2% duction in Volume: 98.5% helialization: Small (1-33%) neling: No ermining: No Wound Description Classification: Partial Thickness Foul Wound Margin: Indistinct, nonvisible Slou Exudate Amount: Large Exudate Type: Serous Exudate Color: amber Odor After Cleansing: No gh/Fibrino Yes Wound Bed Granulation Amount: Small (1-33%) Exposed Structure Granulation Quality: Red Fascia Exposed: No Necrotic Amount: Large (67-100%) Fat Layer (Subcutaneous Tissue) Exposed: Yes Necrotic Quality: Adherent  Slough Tendon Exposed: No Muscle Exposed: No Joint Exposed: No Bone Exposed: No Treatment Notes Wound #1 (Left, Medial, Anterior Lower Leg) Notes xsorb 4 layer Electronic Signature(s) Signed: 12/09/2019 4:50:17 PM By: Jearld Shines, [name] ([id number]) Entered By: Montey Hora on 12/09/2019 15:25:28 Thackeray, Phylliss L. ([id number]) -------------------------------------------------------------------------------- Wound Assessment Details Patient Name: Martha Downs L. Date of  Service: 12/09/2019 3:00 PM Medical Record Number: [id number] Patient Account Number: [id number] Date of Birth/Sex: 1955/12/06 (64 y.o. F) Treating RN: Montey Hora Primary Care Khaleah Duer: Neta Ehlers Other Clinician: Referring Nels Munn: Neta Ehlers Treating Demarus Latterell/Extender: Melburn Hake, HOYT Weeks in Treatment: 14 Wound Status Wound Number: 4 Primary Etiology: Lymphedema Wound Location: Right, Posterior Lower Leg Wound Status: Open Wounding Event: Gradually Appeared Comorbid History: Anemia, Coronary Artery Disease, Hypertension Date Acquired: 10/21/2019 Weeks Of Treatment: 5 Clustered Wound: No Photos Wound Measurements Length: (cm) 0 % Re Width: (cm) 0 % Re Depth: (cm) 0 Epit Area: (cm) 0 Tun Volume: (cm) 0 Und duction in Area: 100% duction in Volume: 100% helialization: Large (67-100%) neling: No ermining: No Wound Description Classification: Partial Thickness Foul Wound Margin: Flat and Intact Slou Exudate Amount: None Present Odor After Cleansing: No gh/Fibrino No Wound Bed Granulation Amount: None Present (0%) Exposed Structure Necrotic Amount: Large (67-100%) Fascia Exposed: No Necrotic Quality: Eschar Fat Layer (Subcutaneous Tissue) Exposed: No Tendon Exposed: No Muscle Exposed: No Joint Exposed: No Bone Exposed: No Limited to Skin Breakdown Treatment Notes Wound #4 (Right, Posterior Lower Leg) Notes xsorb 4 layer Electronic Signature(s) Signed: 12/09/2019  4:50:17 PM By: Jearld Shines, [name] ([id number]) Entered By: Montey Hora on 12/09/2019 15:25:56 [place], [place] ([id number]) -------------------------------------------------------------------------------- Wound Assessment Details Patient Name: Martha Downs L. Date of Service: 12/09/2019 3:00 PM Medical Record Number: [id number] Patient Account Number: [id number] Date of Birth/Sex: [birth year] (64 y.o. F) Treating RN: Montey Hora Primary Care Destinee Taber: Neta Ehlers Other Clinician: Referring Florence Antonelli: Neta Ehlers Treating Barba Solt/Extender: Melburn Hake, HOYT Weeks in Treatment: 14 Wound Status Wound Number: 5 Primary Etiology: Lymphedema Wound Location: Left, Proximal Lower Leg Wound Status: Open Wounding Event: Gradually Appeared Comorbid History: Anemia, Coronary Artery Disease, Hypertension Date Acquired: 10/21/2019 Weeks Of Treatment: 5 Clustered Wound: No Photos Wound Measurements Length: (cm) 1.1 % Redu Width: (cm) 6 % Redu Depth: (cm) 0.1 Epithe Area: (cm) 5.184 [place] Volume: (cm) 0.518 Under ction in Area: -2096.6% ction in Volume: -2058.3% lialization: Small (1-33%) ling: No mining: No Wound Description Classification: Partial Thickness Foul O Wound Margin: Flat and Intact Slough Exudate Amount: Large Exudate Type: Serous Exudate Color: amber dor After Cleansing: No /Fibrino No Wound Bed Granulation Amount: Large (67-100%) Exposed Structure Granulation Quality: Pink Fascia Exposed: No Necrotic Amount: None Present (0%) Fat Layer (Subcutaneous Tissue) Exposed: Yes Tendon Exposed: No Muscle Exposed: No Joint Exposed: No Bone Exposed: No Treatment Notes Wound #5 (Left, Proximal Lower Leg) Notes xsorb 4 layer Electronic Signature(s) Signed: 12/09/2019 4:50:17 PM By: Jearld Shines, [name] ([id number]) Entered By: Montey Hora on 12/09/2019 15:26:27 Ammons, [name]  ([id number]) -------------------------------------------------------------------------------- Vitals Details Patient Name: Martha Downs L. Date of Service: 12/09/2019 3:00 PM Medical Record Number: [id number] Patient Account Number: [id number] Date of Birth/Sex: [birth year] (64 y.o. F) Treating RN: Montey Hora Primary Care Jadie Allington: Neta Ehlers Other Clinician: Referring Malisha Mabey: Neta Ehlers Treating Devondre Guzzetta/Extender: Melburn Hake, HOYT Weeks in Treatment: 14 Vital Signs Time Taken: 15:14 Temperature (F): 98.0 Height (in): 62 Pulse (bpm): 73 Weight (lbs): 290 Respiratory Rate (breaths/min): 18 Body Mass Index (BMI): 53 Blood Pressure (mmHg): 134/64 Reference Range: 80 - 120 mg / dl Electronic Signature(s) Signed: 12/09/2019 4:50:17 PM By: Montey Hora Entered By: Montey Hora on 12/09/2019 15:15:36

## 2019-12-09 NOTE — Progress Notes (Addendum)
LAURITA, RUSHER ([id number]) Visit Report for 12/09/2019 Chief Complaint Document Details Patient Name: [name], [name]. Date of Service: 12/09/2019 3:00 PM Medical Record Number: [id number] Patient Account Number: [id number] Date of Birth/Sex: 17-May-1956 (64 y.o. F) Treating RN: Army Melia Primary Care Provider: Neta Ehlers Other Clinician: Referring Provider: Neta Ehlers Treating Provider/Extender: Melburn Hake, Gaelan Glennon Weeks in Treatment: 14 Information Obtained from: Patient Chief Complaint Bilateral LE Lymphedema and ulcers Electronic Signature(s) Signed: 12/09/2019 3:11:22 PM By: Worthy Keeler PA-C Entered By: Worthy Keeler on 12/09/2019 15:11:22 [place], [place] L. ([id number]) -------------------------------------------------------------------------------- HPI Details Patient Name: [name] L. Date of Service: 12/09/2019 3:00 PM Medical Record Number: [id number] Patient Account Number: [id number] Date of Birth/Sex: Feb 25, 1956 (64 y.o. F) Treating RN: Army Melia Primary Care Provider: Neta Ehlers Other Clinician: Referring Provider: Neta Ehlers Treating Provider/Extender: Melburn Hake, Esiquio Boesen Weeks in Treatment: 14 History of Present Illness HPI Description: 08/27/2019 patient presents today for initial evaluation here in our clinic concerning issues that she has been having with her bilateral lower extremities with lymphedema and weeping. She is previously been a patient over the past year of Dr. Jerilee Hoh at the wound care center in [facility]. With that being said she subsequently was recommended by the lymphedema clinic here in [place] to follow-up with [place] here instead of going back to [place] since they were in closer contact with the clinic here than they are obviously. With that being said the patient states that is why she came her way in order to be evaluated at this point. Fortunately there is no signs of active infection at this time. No  fevers, chills, nausea, vomiting, or diarrhea. The patient does have a history of lymphedema, hypertension, long-term use of anticoagulant therapy, and obstructive sleep apnea. During the visit today and from checking into the discharge she was prone to unexpectedly falling asleep. I did discuss this with her daughter as well who states that this is very common for her she states she is not really sure how compliant her mother is with her CPAP machine that may be part of the issue. The patient did have ABIs performed which revealed have copies of his well today that was on 05/01/2019 and it showed a right ABI of 1.17 with a TBI of 1.12 and a left ABI of 1.24 with a TBI of 0.96. She has had 2 transfusions in the past 2 weeks apparently as well. 08/31/2019 upon evaluation today patient actually appears to be doing somewhat better in regard to her lower extremity ulcers. She still has a tremendous amount of drainage from the left lower extremity unfortunately but her right lower extremity is doing much better which is great news. Overall I am very pleased in this regard. With regard to home health we finally got things settled so home health will be coming out to see her this would be very beneficial in my opinion as I think they will be able to help her tremendously with more frequent dressing changes. 12/28-Patient is back here at 1 week visit, with regard to her bilateral lower extremity ulcers, she has a significant degree of lymphedema, significant degree of drainage more from the left and which continues to be an issue, patient had not been using her lymphedema pumps as recommended, she is concerned about amount of drainage and how quickly it soaks the dressing, she is concerned about the lymphedema pump sticking to her skin and was told to use it on the wraps.  She will get in touch with her cardiologist about adjusting her Lasix dose. 09/14/2019 on evaluation today patient appears to be doing poorly  in regard to her lower extremities especially left lower extremity. Unfortunately she is continue to have a significant amount of drainage which is preventing her from being able to heal keeping the wounds very macerated. She also has some warmth to touch in the anterior to medial portion of the left lower extremity compared to the remainder of the wound which does have made somewhat concerned about the possibility of cellulitis. Obviously it can be difficult to determine just strictly lymphedema versus cellulitis but right now I am somewhat concerned. She tells me that she can put a wrap on and at least has to change this twice a day or else she will be walking around leaving puddles of water behind her. Obviously this is fairly significant. She did see her primary care provider they did not see anything going on that they felt needed to be addressed from a diuretic standpoint but nonetheless I am still somewhat concerned based on what we are seeing to be honest. The patient and her husband are likewise concerned. 09/17/2019 patient is seen today in follow-up I last saw her on Tuesday at that point based on the severity of her lower extremity edema, weeping, and erythema I did send her to the ER for further evaluation and treatment. She subsequently did go for that evaluation. It did appear that based on my review of the notes this is dated 09/14/2019 that the patient did have a BNP of 149.4. Which was notable. Subsequently she was given a dose of IV vancomycin as well as doxycycline and Keflex. It appears however that she was discharged on oral clindamycin 150 mg every 6 hours and to be honest compared to what I saw on Tuesday she actually seems to be doing somewhat better. Fortunately there is no evidence of active infection at this time which is good news. In the interim she did yesterday see her cardiologist as well Dr. Bettina Gavia. That was on 09/16/2019. Upon review of this note it does appear that his  recommendation at this point was to increase the furosemide to 40 mg 2 times a day. He also recommend an increase of potassium 10 mg taken 2 times a day. His other recommendation was that the patient needs to have a follow-up with her hematology oncologist in order to see if they can do anything about iron transfusions for her as unfortunately her iron is still quite low and obviously oral supplementation is not going to be the best way to get this up. 09/24/2019 upon evaluation today patient appears to be doing quite well with regard to her lower extremities. The right lower extremity in particular has dramatically improved to be honest so has the left in my opinion. There does not appear to be any signs of active infection which is good news. No fevers, chills, nausea, vomiting, or diarrhea. Patient is still taking the clindamycin at this time. 10/01/2019 on evaluation today patient appears to be showing some signs of improvement at this point in regard to her lower extremities. The right lower extremity is doing quite well the left lower extremity is still draining but again I do believe this is significantly better compared to what it was in the past. Fortunately there is no evidence of active infection at this time. No fever chills noted 10/08/2019 upon evaluation today patient appears to have smaller areas of erythema and  weeping on the left lower extremity as compared to last week's evaluation. The right leg is doing excellent. With that being said unfortunately she is still having a lot of significant swelling on the left in fact the swelling is worse today than it was last week. With that being said she is telling me that she is able to keep her wraps on now for at least the day sometimes even up to 30 hours before they are having to be changed. When they do have to change it they are using her lymphedema wraps at home to reapply what they can. She only changes that she tells me when it actually  gets to the point that is draining so much that it leaks onto her floor. 10/15/2019 upon evaluation today patient appears to actually be doing very well with regard to her lower extremities in fact I believe the right lower extremity may be completely sealed up that we will likely watch it 1 more week. The left lower extremity is doing better she does not seem to be having as much weeping and leaking but still this is going on. Home health still has not gotten the [place] there is still just using ABD pads I believe was able Gulkana, Yanet L. ([id number]) to get Lauraine Rinne this would actually help out. 10/21/2019 upon evaluation today patient appears to be doing much better in regard to her lower extremities bilaterally. The right lower extremity has 1 area remaining that still draining the left lower extremity is still draining but not nearly what it was in the past. Overall I am extremely pleased with how things seem to be progressing. 10/29/2019 upon evaluation today patient actually appears to be doing quite well in regard to her lower extremities bilaterally both extremities show signs of improvement. The biggest issue we seem to be having is that unfortunately the home health nurse does not seem to be wrapping this quite right in that it seems to slide down within just a few hours of them coming out to rewrap her according to the patient and her husband. The wraps we put on the state last much longer in fact generally until they are taken off. Fortunately there is no signs of infection although unfortunately she does still have a few open areas on both lower extremities at this time. 11/12/2019 upon evaluation today patient appears to be doing excellent in regard to her wounds. She does not have weeping like she is had in the past and overall I feel like she is doing quite well. With that being said this is something that has continued to give her trouble and that she states that the wrap is sliding  down especially when home health puts this on. She tells me that the ones we put on do not seem to be sliding down at this point. Nonetheless I think that this is definitely something of concern. Obviously if it slides down can cause trouble with getting the area to effectively heal and continue to do well. 11/19/2019 upon evaluation today patient actually appears to be doing very well with regard to her bilateral lower extremities. She just has a couple small areas 1 on the right 2 on the left that are still open at this point and all seem to be headed in the right direction. She is no longer saturating her wraps which is also good news. Overall I am extremely pleased with the progress that is been made up to this point. I do believe we  may be able to switch into her lymphedema wraps at this point. 12/09/2019 unfortunately the patient appears to be doing more poorly this week in regard to her legs. We actually transitioned her to her lymphedema wraps last week although I do not believe they are controlling her edema well enough. She has areas that have reopened on the right and just a very small weeping region in fact there is not even really a wound here. On the left she has wounds that have reopened and appear to be doing worse she is weeping quite significantly. I think Lauraine Rinne is good to be the ideal thing here I do not even think we can use the alginate as I am afraid that we just become too moist at this point. Fortunately there is no signs of active infection at this time. She is on doxycycline for completely separate issue dealing with a uterine fibroid. Electronic Signature(s) Signed: 12/09/2019 3:45:17 PM By: Worthy Keeler PA-C Entered By: Worthy Keeler on 12/09/2019 15:45:17 Umscheid, [name] ([id number]) -------------------------------------------------------------------------------- Physical Exam Details Patient Name: KEYARAH, GRUNWALD L. Date of Service: 12/09/2019 3:00 PM Medical Record  Number: [id number] Patient Account Number: [id number] Date of Birth/Sex: Feb 02, 1956 (63 y.o. F) Treating RN: Army Melia Primary Care Provider: Neta Ehlers Other Clinician: Referring Provider: Neta Ehlers Treating Provider/Extender: Melburn Hake, Lavonia Eager Weeks in Treatment: 14 Constitutional Obese and well-hydrated in no acute distress. Respiratory normal breathing without difficulty. Psychiatric this patient is able to make decisions and demonstrates good insight into disease process. Alert and Oriented x 3. pleasant and cooperative. Notes Patient's wounds currently again showed signs of significant weeping at this point. This is backtrack since last week's evaluation where things stood she was pretty much healed at that point and we were transitioning back into her lymphedema wraps. With that being said I think it is obvious that lymphedema wraps do not seem to be controlling her feet sufficiently enough. Also think that the other issue here is that she apparently has been up on her feet a lot in the past week her husband states even more so than normal. Electronic Signature(s) Signed: 12/09/2019 3:45:49 PM By: Worthy Keeler PA-C Entered By: Worthy Keeler on 12/09/2019 15:45:48 Clare, [name] ([id number]) -------------------------------------------------------------------------------- Physician Orders Details Patient Name: [name] L. Date of Service: 12/09/2019 3:00 PM Medical Record Number: [id number] Patient Account Number: [id number] Date of Birth/Sex: 1956-08-26 (64 y.o. 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 Wound #4 Right,Posterior Lower Leg o Cleanse wound with mild soap and water o Dial antibacterial soap, wash wounds, rinse and pat dry prior to dressing wounds o May Shower, gently pat wound dry prior to applying new dressing. Wound #5 Left,Proximal Lower Leg o Cleanse wound with mild soap and water o Dial antibacterial soap, wash wounds,  rinse and pat dry prior to dressing wounds o May Shower, gently pat wound dry prior to applying new dressing. Skin Barriers/Peri-Wound Care Wound #1 Left,Medial,Anterior Lower Leg o Triamcinolone Acetonide Ointment (TCA) - to either leg on places that itch - ask patient Wound #4 Right,Posterior Lower Leg o Triamcinolone Acetonide Ointment (TCA) - to either leg on places that itch - ask patient Wound #5 Left,Proximal Lower Leg o Triamcinolone Acetonide Ointment (TCA) - to either leg on places that itch - ask patient Primary Wound Dressing Wound #1 Left,Medial,Anterior Lower Leg o XtraSorb Wound #4 Right,Posterior Lower Leg o XtraSorb Wound #5 Left,Proximal Lower Leg o XtraSorb Dressing Change Frequency Wound #1 Left,Medial,Anterior Lower Leg o Change dressing every week - Nurse visit as needed Wound #4 Right,Posterior Lower Leg o Change dressing every week - Nurse visit as needed Wound #5 Left,Proximal Lower Leg Shibley, Simar L. ([id number]) o Change dressing every week - Nurse visit as needed Follow-up Appointments Wound #1 Left,Medial,Anterior Lower Leg o Return Appointment in 1  week. Wound #4 Right,Posterior Lower Leg o Return Appointment in 1 week. Wound #5 Left,Proximal Lower Leg o Return Appointment in 1 week. Edema Control Wound #1 Left,Medial,Anterior Lower Leg o 4 Layer Compression System - Bilateral o Elevate legs to the level of the heart and pump ankles as often as possible o Compression Pump: Use compression pump on left lower extremity for 60 minutes, twice daily. o Compression Pump: Use compression pump on right lower extremity for 60 minutes, twice daily. Wound #4 Right,Posterior Lower Leg o 4 Layer Compression System - Bilateral o Elevate legs to the level of the heart and pump ankles as often as possible o Compression Pump: Use compression pump on left lower extremity for 60 minutes, twice daily. o Compression Pump: Use compression pump on right lower extremity for 60 minutes, twice daily. Wound #5 Left,Proximal Lower Leg o 4 Layer Compression System - Bilateral o Elevate legs to the level of the heart and pump ankles as often as possible o Compression Pump: Use compression pump on left lower extremity for 60 minutes, twice daily. o Compression Pump: Use compression pump on right lower extremity for 60 minutes, twice daily. Electronic Signature(s) Signed: 12/09/2019 3:58:38 PM By: Army Melia Signed: 12/09/2019 5:29:46 PM By: Worthy Keeler PA-C Entered By: Army Melia on 12/09/2019 15:43:39 Albrecht, [name] ([id number]) -------------------------------------------------------------------------------- Problem List Details Patient Name: NIOMA, PRUSHA L. Date of Service: 12/09/2019 3:00 PM Medical Record Number: [id number] Patient Account Number: [id number] Date of Birth/Sex: [birth year] (64 y.o. F) Treating RN: Army Melia Primary Care Provider: Neta Ehlers Other Clinician: Referring Provider: Neta Ehlers Treating Provider/Extender: Melburn Hake, Vidyuth Belsito Weeks in Treatment: 14 Active Problems ICD-10 Evaluated  Encounter Code Description Active Date Today Diagnosis I89.0 Lymphedema, not elsewhere classified 08/27/2019 No Yes L97.812 Non-pressure chronic ulcer of other part of right lower leg with fat layer 08/27/2019 No Yes exposed L97.822 Non-pressure chronic ulcer of other part of left lower leg with fat layer 08/27/2019 No Yes exposed [place] (primary) hypertension 08/27/2019 No Yes Z79.01 Long term (current) use of anticoagulants 08/27/2019 No Yes Inactive Problems Resolved Problems Electronic Signature(s) Signed: 12/09/2019 3:11:15 PM By: Worthy Keeler PA-C Entered By: Worthy Keeler on 12/09/2019 15:11:14 Harvest, [place] ([id number]) -------------------------------------------------------------------------------- Progress Note Details Patient Name: [name] L. Date of Service: 12/09/2019 3:00 PM Medical Record Number: [id number] Patient Account Number: [id number] Date of Birth/Sex: 1956-04-06 (64 y.o. F) Treating RN: Army Melia Primary Care Provider: Neta Ehlers Other  Clinician: Referring Provider: Neta Ehlers Treating Provider/Extender: Melburn Hake, Terrie Grajales Weeks in Treatment: 14 Subjective Chief Complaint Information obtained from Patient Bilateral LE Lymphedema and ulcers History of Present Illness (HPI) 08/27/2019 patient presents today for initial evaluation here in our clinic concerning issues that she has been having with her bilateral lower extremities with lymphedema and weeping. She is previously been a patient over the past year of Dr. Jerilee Hoh at the wound care center in [facility]. With that being said she subsequently was recommended by the lymphedema clinic here in [place] to follow-up with [place] here instead of going back to [place] since they were in closer contact with the clinic here than they are obviously. With that being said the patient states that is why she came her way in order to be evaluated at this point. Fortunately there  is no signs of active infection at this time. No fevers, chills, nausea, vomiting, or diarrhea. The patient does have a history of lymphedema, hypertension, long-term use of anticoagulant therapy, and obstructive sleep apnea. During the visit today and from checking into the discharge she was prone to unexpectedly falling asleep. I did discuss this with her daughter as well who states that this is very common for her she states she is not really sure how compliant her mother is with her CPAP machine that may be part of the issue. The patient did have ABIs performed which revealed have copies of his well today that was on 05/01/2019 and it showed a right ABI of 1.17 with a TBI of 1.12 and a left ABI of 1.24 with a TBI of 0.96. She has had 2 transfusions in the past 2 weeks apparently as well. 08/31/2019 upon evaluation today patient actually appears to be doing somewhat better in regard to her lower extremity ulcers. She still has a tremendous amount of drainage from the left lower extremity unfortunately but her right lower extremity is doing much better which is great news. Overall I am very pleased in this regard. With regard to home health we finally got things settled so home health will be coming out to see her this would be very beneficial in my opinion as I think they will be able to help her tremendously with more frequent dressing changes. 12/28-Patient is back here at 1 week visit, with regard to her bilateral lower extremity ulcers, she has a significant degree of lymphedema, significant degree of drainage more from the left and which continues to be an issue, patient had not been using her lymphedema pumps as recommended, she is concerned about amount of drainage and how quickly it soaks the dressing, she is concerned about the lymphedema pump sticking to her skin and was told to use it on the wraps. She will get in touch with her cardiologist about adjusting her Lasix dose. 09/14/2019 on  evaluation today patient appears to be doing poorly in regard to her lower extremities especially left lower extremity. Unfortunately she is continue to have a significant amount of drainage which is preventing her from being able to heal keeping the wounds very macerated. She also has some warmth to touch in the anterior to medial portion of the left lower extremity compared to the remainder of the wound which does have made somewhat concerned about the possibility of cellulitis. Obviously it can be difficult to determine just strictly lymphedema versus cellulitis but right now I am somewhat concerned. She tells me that she can put a wrap on and at least  has to change this twice a day or else she will be walking around leaving puddles of water behind her. Obviously this is fairly significant. She did see her primary care provider they did not see anything going on that they felt needed to be addressed from a diuretic standpoint but nonetheless I am still somewhat concerned based on what we are seeing to be honest. The patient and her husband are likewise concerned. 09/17/2019 patient is seen today in follow-up I last saw her on Tuesday at that point based on the severity of her lower extremity edema, weeping, and erythema I did send her to the ER for further evaluation and treatment. She subsequently did go for that evaluation. It did appear that based on my review of the notes this is dated 09/14/2019 that the patient did have a BNP of 149.4. Which was notable. Subsequently she was given a dose of IV vancomycin as well as doxycycline and Keflex. It appears however that she was discharged on oral clindamycin 150 mg every 6 hours and to be honest compared to what I saw on Tuesday she actually seems to be doing somewhat better. Fortunately there is no evidence of active infection at this time which is good news. In the interim she did yesterday see her cardiologist as well Dr. Bettina Gavia. That was on 09/16/2019.  Upon review of this note it does appear that his recommendation at this point was to increase the furosemide to 40 mg 2 times a day. He also recommend an increase of potassium 10 mg taken 2 times a day. His other recommendation was that the patient needs to have a follow-up with her hematology oncologist in order to see if they can do anything about iron transfusions for her as unfortunately her iron is still quite low and obviously oral supplementation is not going to be the best way to get this up. 09/24/2019 upon evaluation today patient appears to be doing quite well with regard to her lower extremities. The right lower extremity in particular has dramatically improved to be honest so has the left in my opinion. There does not appear to be any signs of active infection which is good news. No fevers, chills, nausea, vomiting, or diarrhea. Patient is still taking the clindamycin at this time. 10/01/2019 on evaluation today patient appears to be showing some signs of improvement at this point in regard to her lower extremities. The right lower extremity is doing quite well the left lower extremity is still draining but again I do believe this is significantly better compared to what it was in the past. Fortunately there is no evidence of active infection at this time. No fever chills noted 10/08/2019 upon evaluation today patient appears to have smaller areas of erythema and weeping on the left lower extremity as compared to last week's evaluation. The right leg is doing excellent. With that being said unfortunately she is still having a lot of significant swelling on the left in fact the swelling is worse today than it was last week. With that being said she is telling me that she is able to keep her wraps on now for at least the day sometimes even up to 30 hours before they are having to be changed. When they do have to change it they are using her lymphedema wraps at home [place], [place].  ([id number]) to reapply what they can. She only changes that she tells me when it actually gets to the point that is draining so much  that it leaks onto her floor. 10/15/2019 upon evaluation today patient appears to actually be doing very well with regard to her lower extremities in fact I believe the right lower extremity may be completely sealed up that we will likely watch it 1 more week. The left lower extremity is doing better she does not seem to be having as much weeping and leaking but still this is going on. Home health still has not gotten the [place] there is still just using ABD pads I believe was able to get Lauraine Rinne this would actually help out. 10/21/2019 upon evaluation today patient appears to be doing much better in regard to her lower extremities bilaterally. The right lower extremity has 1 area remaining that still draining the left lower extremity is still draining but not nearly what it was in the past. Overall I am extremely pleased with how things seem to be progressing. 10/29/2019 upon evaluation today patient actually appears to be doing quite well in regard to her lower extremities bilaterally both extremities show signs of improvement. The biggest issue we seem to be having is that unfortunately the home health nurse does not seem to be wrapping this quite right in that it seems to slide down within just a few hours of them coming out to rewrap her according to the patient and her husband. The wraps we put on the state last much longer in fact generally until they are taken off. Fortunately there is no signs of infection although unfortunately she does still have a few open areas on both lower extremities at this time. 11/12/2019 upon evaluation today patient appears to be doing excellent in regard to her wounds. She does not have weeping like she is had in the past and overall I feel like she is doing quite well. With that being said this is something that has continued to give  her trouble and that she states that the wrap is sliding down especially when home health puts this on. She tells me that the ones we put on do not seem to be sliding down at this point. Nonetheless I think that this is definitely something of concern. Obviously if it slides down can cause trouble with getting the area to effectively heal and continue to do well. 11/19/2019 upon evaluation today patient actually appears to be doing very well with regard to her bilateral lower extremities. She just has a couple small areas 1 on the right 2 on the left that are still open at this point and all seem to be headed in the right direction. She is no longer saturating her wraps which is also good news. Overall I am extremely pleased with the progress that is been made up to this point. I do believe we may be able to switch into her lymphedema wraps at this point. 12/09/2019 unfortunately the patient appears to be doing more poorly this week in regard to her legs. We actually transitioned her to her lymphedema wraps last week although I do not believe they are controlling her edema well enough. She has areas that have reopened on the right and just a very small weeping region in fact there is not even really a wound here. On the left she has wounds that have reopened and appear to be doing worse she is weeping quite significantly. I think Lauraine Rinne is good to be the ideal thing here I do not even think we can use the alginate as I am afraid that we just become too  moist at this point. Fortunately there is no signs of active infection at this time. She is on doxycycline for completely separate issue dealing with a uterine fibroid. Objective Constitutional Obese and well-hydrated in no acute distress. Vitals Time Taken: 3:14 PM, Height: 62 in, Weight: 290 lbs, BMI: 53, Temperature: 98.0 F, Pulse: 73 bpm, Respiratory Rate: 18 breaths/min, Blood Pressure: 134/64 mmHg. Respiratory normal breathing without  difficulty. Psychiatric this patient is able to make decisions and demonstrates good insight into disease process. Alert and Oriented x 3. pleasant and cooperative. General Notes: Patient's wounds currently again showed signs of significant weeping at this point. This is backtrack since last week's evaluation where things stood she was pretty much healed at that point and we were transitioning back into her lymphedema wraps. With that being said I think it is obvious that lymphedema wraps do not seem to be controlling her feet sufficiently enough. Also think that the other issue here is that she apparently has been up on her feet a lot in the past week her husband states even more so than normal. Integumentary (Hair, Skin) Wound #1 status is Open. Original cause of wound was Gradually Appeared. The wound is located on the Left,Medial,Anterior Lower Leg. The wound measures 3.4cm length x 2.9cm width x 0.2cm depth; 7.744cm^2 area and 1.549cm^3 volume. There is Fat Layer (Subcutaneous Tissue) Exposed exposed. There is no tunneling or undermining noted. There is a large amount of serous drainage noted. The wound margin is indistinct and nonvisible. There is small (1-33%) red granulation within the wound bed. There is a large (67-100%) amount of necrotic tissue within the wound bed including [place]herent [place]. Kiedrow[place], [place] ([id number]) Wound #4 status is Open. Original cause of wound was Gradually Appeared. The wound is located on the Right,Posterior Lower Leg. The wound measures 0cm length x 0cm width x 0cm depth; 0cm^2 area and 0cm^3 volume. The wound is limited to skin breakdown. There is no tunneling or undermining noted. There is a none present amount of drainage noted. The wound margin is flat and intact. There is no granulation within the wound bed. There is a large (67-100%) amount of necrotic tissue within the wound bed including Eschar. Wound #5 status is Open. Original cause of wound was  Gradually Appeared. The wound is located on the Left,Proximal Lower Leg. The wound measures 1.1cm length x 6cm width x 0.1cm depth; 5.184cm^2 area and 0.518cm^3 volume. There is Fat Layer (Subcutaneous Tissue) Exposed exposed. There is no tunneling or undermining noted. There is a large amount of serous drainage noted. The wound margin is flat and intact. There is large (67-100%) pink granulation within the wound bed. There is no necrotic tissue within the wound bed. Assessment Active Problems ICD-10 Lymphedema, not elsewhere classified Non-pressure chronic ulcer of other part of right lower leg with fat layer exposed Non-pressure chronic ulcer of other part of left lower leg with fat layer exposed Essential (primary) hypertension Long term (current) use of anticoagulants Procedures Wound #1 Pre-procedure diagnosis of Wound #1 is a Lymphedema located on the Left,Medial,Anterior Lower Leg . There was a Four Layer Compression Therapy Procedure by Army Melia, RN. Post procedure Diagnosis Wound #1: Same as Pre-Procedure Wound #4 Pre-procedure diagnosis of Wound #4 is a Lymphedema located on the Right,Posterior Lower Leg . There was a Four Layer Compression Therapy Procedure by Army Melia, RN. Post procedure Diagnosis Wound #4: Same as Pre-Procedure Wound #5 Pre-procedure diagnosis of Wound #5 is a Lymphedema located on the Left,Proximal  Lower Leg . There was a Four Layer Compression Therapy Procedure by Army Melia, RN. Post procedure Diagnosis Wound #5: Same as Pre-Procedure Plan Wound Cleansing: Wound #1 Left,Medial,Anterior Lower Leg: Cleanse wound with mild soap and water Dial antibacterial soap, wash wounds, rinse and pat dry prior to dressing wounds May Shower, gently pat wound dry prior to applying new dressing. Wound #4 Right,Posterior Lower Leg: Cleanse wound with mild soap and water Dial antibacterial soap, wash wounds, rinse and pat dry prior to dressing wounds May  Shower, gently pat wound dry prior to applying new dressing. Wound #5 Left,Proximal Lower Leg: Cleanse wound with mild soap and water Dial antibacterial soap, wash wounds, rinse and pat dry prior to dressing wounds May Shower, gently pat wound dry prior to applying new dressing. Skin Barriers/Peri-Wound Care: MILAYNA, [place] ([id number]) Wound #1 Left,Medial,Anterior Lower Leg: Triamcinolone Acetonide Ointment (TCA) - to either leg on places that itch - ask patient Wound #4 Right,Posterior Lower Leg: Triamcinolone Acetonide Ointment (TCA) - to either leg on places that itch - ask patient Wound #5 Left,Proximal Lower Leg: Triamcinolone Acetonide Ointment (TCA) - to either leg on places that itch - ask patient Primary Wound Dressing: Wound #1 Left,Medial,Anterior Lower Leg: XtraSorb Wound #4 Right,Posterior Lower Leg: XtraSorb Wound #5 Left,Proximal Lower Leg: XtraSorb Dressing Change Frequency: Wound #1 Left,Medial,Anterior Lower Leg: Change dressing every week - Nurse visit as needed Wound #4 Right,Posterior Lower Leg: Change dressing every week - Nurse visit as needed Wound #5 Left,Proximal Lower Leg: Change dressing every week - Nurse visit as needed Follow-up Appointments: Wound #1 Left,Medial,Anterior Lower Leg: Return Appointment in 1 week. Wound #4 Right,Posterior Lower Leg: Return Appointment in 1 week. Wound #5 Left,Proximal Lower Leg: Return Appointment in 1 week. Edema Control: Wound #1 Left,Medial,Anterior Lower Leg: 4 Layer Compression System - Bilateral Elevate legs to the level of the heart and pump ankles as often as possible Compression Pump: Use compression pump on left lower extremity for 60 minutes, twice daily. Compression Pump: Use compression pump on right lower extremity for 60 minutes, twice daily. Wound #4 Right,Posterior Lower Leg: 4 Layer Compression System - Bilateral Elevate legs to the level of the heart and pump ankles as often as  possible Compression Pump: Use compression pump on left lower extremity for 60 minutes, twice daily. Compression Pump: Use compression pump on right lower extremity for 60 minutes, twice daily. Wound #5 Left,Proximal Lower Leg: 4 Layer Compression System - Bilateral Elevate legs to the level of the heart and pump ankles as often as possible Compression Pump: Use compression pump on left lower extremity for 60 minutes, twice daily. Compression Pump: Use compression pump on right lower extremity for 60 minutes, twice daily. 1 I really think that the patient does need to transition back now into doing the 4 layer compression wraps will use Xtrasorb as the dressing of choice. 2. I also believe that she really needs to elevate her legs more as well as use her lymphedema pumps although she tells me she has not used pumps at home past week. 3. With regard to the weeping I think this is again due to the fact that her legs are significantly swollen and I think that that needs to be gotten under better control we can have to reinitiate wrapping for her. We will see patient back for reevaluation in 1 week here in the clinic. If anything worsens or changes patient will contact our office for additional recommendations. Electronic Signature(s) Signed: 12/09/2019 3:46:32 PM  By: Worthy Keeler PA-C Entered By: Worthy Keeler on 12/09/2019 15:46:32 Noh, [name] ([id number]) -------------------------------------------------------------------------------- SuperBill Details Patient Name: [name]. Date of Service: 12/09/2019 Medical Record Number: [id number] Patient Account Number: [id number] Date of Birth/Sex: [birth year] (65 y.o. F) Treating RN: Army Melia Primary Care Provider: Neta Ehlers Other Clinician: Referring Provider: Neta Ehlers Treating Provider/Extender: Melburn Hake, [place] Chillemi Weeks in Treatment: 14 Diagnosis Coding ICD-10 Codes Code Description I89.0 Lymphedema, not elsewhere  classified L97.812 Non-pressure chronic ulcer of other part of right lower leg with fat layer exposed L97.822 Non-pressure chronic ulcer of other part of left lower leg with fat layer exposed [place] (primary) hypertension Z79.01 Long term (current) use of anticoagulants Facility Procedures CPT4: Description Modifier Quantity Code [id number] BILATERAL: Application of multi-layer venous compression system; leg (below knee), including ankle 1 and foot. Physician Procedures CPT4 Code: [id number] Description: [id number] - WC PHYS LEVEL 3 - EST PT Modifier: Quantity: 1 CPT4 Code: Description: ICD-10 Diagnosis Description I89.0 Lymphedema, not elsewhere classified [id number] Non-pressure chronic ulcer of other part of right lower leg with fat layer L97.822 Non-pressure chronic ulcer of other part of left lower leg with fat layer e  I10 Essential (primary) hypertension Modifier: exposed xposed Quantity: Electronic Signature(s) Signed: 12/09/2019 3:46:44 PM By: Worthy Keeler PA-C Entered By: Worthy Keeler on 12/09/2019 15:46:44

## 2019-12-13 ENCOUNTER — Other Ambulatory Visit: Payer: Self-pay

## 2019-12-13 DIAGNOSIS — L97812 Non-pressure chronic ulcer of other part of right lower leg with fat layer exposed: Secondary | ICD-10-CM | POA: Diagnosis not present

## 2019-12-14 NOTE — Progress Notes (Signed)
ARGENTINA, STADTLER ([id number]) Visit Report for 12/13/2019 Arrival Information Details Patient Name: [name], [name]. Date of Service: 12/13/2019 3:00 PM Medical Record Number: [id number] Patient Account Number: [id number] Date of Birth/Sex: 1956-08-28 (64 y.o. F) Treating RN: Montey Hora Primary Care Morena Mckissack: Neta Ehlers Other Clinician: Referring Yenifer Saccente: Neta Ehlers Treating Zarah Carbon/Extender: Melburn Hake, HOYT Weeks in Treatment: 15 Visit Information History Since Last Visit Added or deleted any medications: No Patient Arrived: Ambulatory Any new allergies or adverse reactions: No Arrival Time: 15:15 Had a fall or experienced change in No Accompanied By: spouse activities of daily living that may affect Transfer Assistance: None risk of falls: Patient Identification Verified: Yes Signs or symptoms of abuse/neglect since last visito No Secondary Verification Process Completed: Yes Hospitalized since last visit: No Patient Has Alerts: Yes Implantable device outside of the clinic excluding No Patient Alerts: Patient on Blood Thinner cellular tissue based products placed in the center warfarin since last visit: Not Diabetic Has Dressing in Place as Prescribed: Yes 04/29/2019 [facility] Has Compression in Place as Prescribed: No ABI R)1.17 L)1.24 TBI R) 1.12 L) 0.96 Pain Present Now: No Electronic Signature(s) Signed: 12/13/2019 4:22:02 PM By: Montey Hora Entered By: Montey Hora on 12/13/2019 16:22:02 Capers, Antlers L. ([id number]) -------------------------------------------------------------------------------- Compression Therapy Details Patient Name: Martha Downs L. Date of Service: 12/13/2019 3:00 PM Medical Record Number: [id number] Patient Account Number: [id number] Date of Birth/Sex: 12-30-1955 (64 y.o. F) Treating RN: Montey Hora Primary Care Marqus Macphee: Neta Ehlers Other Clinician: Referring Jamiee Milholland: Neta Ehlers Treating Evynn Boutelle/Extender: Melburn Hake,  HOYT Weeks in Treatment: 15 Compression Therapy Performed for Wound Assessment: Wound #1 Left,Medial,Anterior Lower Leg Performed By: Clinician Montey Hora, RN Compression Type: Four Layer Pre Treatment ABI: 1.2 Electronic Signature(s) Signed: 12/13/2019 4:23:07 PM By: Montey Hora Entered By: Montey Hora on 12/13/2019 16:23:06 [place]on, [place] ([id number]) -------------------------------------------------------------------------------- Compression Therapy Details Patient Name: Martha Downs L. Date of Service: 12/13/2019 3:00 PM Medical Record Number: [id number] Patient Account Number: [id number] Date of Birth/Sex: 04/15/1956 (64 y.o. F) Treating RN: Montey Hora Primary Care Algernon Mundie: Neta Ehlers Other Clinician: Referring Shannelle Alguire: Neta Ehlers Treating Rodd Heft/Extender: Melburn Hake, HOYT Weeks in Treatment: 15 Compression Therapy Performed for Wound Assessment: Wound #4 Right,Posterior Lower Leg Performed By: Clinician Montey Hora, RN Compression Type: Four Layer Pre Treatment ABI: 1.2 Electronic Signature(s) Signed: 12/13/2019 4:23:07 PM By: Montey Hora Entered By: Montey Hora on 12/13/2019 16:23:07 [place]abrook, [name] ([id number]) -------------------------------------------------------------------------------- Compression Therapy Details Patient Name: Martha Downs L. Date of Service: 12/13/2019 3:00 PM Medical Record Number: [id number] Patient Account Number: [id number] Date of Birth/Sex: 1956-04-22 (64 y.o. F) Treating RN: Montey Hora Primary Care Viktorya Arguijo: Neta Ehlers Other Clinician: Referring Jaymeson Mengel: Neta Ehlers Treating Jaaliyah Lucatero/Extender: Melburn Hake, HOYT Weeks in Treatment: 15 Compression Therapy Performed for Wound Assessment: Wound #5 Left,Proximal Lower Leg Performed By: Clinician Montey Hora, RN Compression Type: Four Layer Pre Treatment ABI: 1.2 Electronic Signature(s) Signed: 12/13/2019 4:23:07 PM By: Montey Hora Entered By:  Montey Hora on 12/13/2019 16:23:07 Bryngelson, [name] ([id number]) -------------------------------------------------------------------------------- Encounter Discharge Information Details Patient Name: [name], [name] Date of Service: 12/13/2019 3:00 PM Medical Record Number: [id number] Patient Account Number: [id number] Date of Birth/Sex: 06-04-1956 (64 y.o. F) Treating RN: Montey Hora Primary Care Jamarion [place]: Neta Ehlers Other Clinician: Referring Marzetta Lanza: Neta Ehlers Treating Windy Dudek/Extender: Melburn Hake, HOYT Weeks in Treatment: 15 Encounter Discharge Information Items Discharge Condition: Stable Ambulatory Status: Ambulatory Discharge Destination: Home Transportation: Private Auto Accompanied By: spouse Schedule Follow-up Appointment: No  Clinical Summary of Care: Electronic Signature(s) Signed: 12/13/2019 4:23:50 PM By: Montey Hora Entered By: Montey Hora on 12/13/2019 16:23:49 Cremeens, [name] ([id number]) -------------------------------------------------------------------------------- Wound Assessment Details Patient Name: Martha Downs L. Date of Service: 12/13/2019 3:00 PM Medical Record Number: [id number] Patient Account Number: [id number] Date of Birth/Sex: 1955/12/08 (64 y.o. F) Treating RN: Montey Hora Primary Care Yavuz Kirby: Neta Ehlers Other Clinician: Referring Don Tiu: Neta Ehlers Treating [name]/Extender: Melburn Hake, HOYT Weeks in Treatment: 15 Wound Status Wound Number: 1 Primary Etiology: Lymphedema Wound Location: Left, Medial, Anterior Lower Leg Wound Status: Open Wounding Event: Gradually Appeared Date Acquired: 08/04/2019 Weeks Of Treatment: 15 Clustered Wound: No Wound Measurements Length: (cm) 3.4 Width: (cm) 2.9 Depth: (cm) 0.2 Area: (cm) 7.744 Volume: (cm) 1.549 % Reduction in Area: 99.2% % Reduction in Volume: 98.5% Wound Description Classification: Partial Thickness Treatment Notes Wound #1 (Left, Medial,  Anterior Lower Leg) Notes xsorb 4 layer Electronic Signature(s) Signed: 12/13/2019 4:25:15 PM By: Montey Hora Entered By: Montey Hora on 12/13/2019 16:22:34 Fasig, Anjela L. ([id number]) -------------------------------------------------------------------------------- Wound Assessment Details Patient Name: Martha Downs L. Date of Service: 12/13/2019 3:00 PM Medical Record Number: [id number] Patient Account Number: [id number] Date of Birth/Sex: 11/05/1955 (64 y.o. F) Treating RN: Montey Hora Primary Care Filippa Yarbough: Neta Ehlers Other Clinician: Referring Jaydis Duchene: Neta Ehlers Treating Annarose Ouellet/Extender: Melburn Hake, HOYT Weeks in Treatment: 15 Wound Status Wound Number: 4 Primary Etiology: Lymphedema Wound Location: Right, Posterior Lower Leg Wound Status: Open Wounding Event: Gradually Appeared Date Acquired: 10/21/2019 Weeks Of Treatment: 6 Clustered Wound: No Wound Measurements Length: (cm) 0 Width: (cm) 0 Depth: (cm) 0 Area: (cm) 0 Volume: (cm) 0 % Reduction in Area: 100% % Reduction in Volume: 100% Wound Description Classification: Partial Thickness Treatment Notes Wound #4 (Right, Posterior Lower Leg) Notes xsorb 4 layer Electronic Signature(s) Signed: 12/13/2019 4:25:15 PM By: Montey Hora Entered By: Montey Hora on 12/13/2019 16:22:34 Crammer, Nyeema L. ([id number]) -------------------------------------------------------------------------------- Wound Assessment Details Patient Name: Martha Downs L. Date of Service: 12/13/2019 3:00 PM Medical Record Number: [id number] Patient Account Number: [id number] Date of Birth/Sex: [birth year] (64 y.o. F) Treating RN: Montey Hora Primary Care Angelena Sand: Neta Ehlers Other Clinician: Referring Shahid Flori: Neta Ehlers Treating Hau Sanor/Extender: Melburn Hake, HOYT Weeks in Treatment: 15 Wound Status Wound Number: 5 Primary Etiology: Lymphedema Wound Location: Left, Proximal Lower Leg Wound Status:  Open Wounding Event: Gradually Appeared Date Acquired: 10/21/2019 Weeks Of Treatment: 6 Clustered Wound: No Wound Measurements Length: (cm) 1.1 Width: (cm) 6 Depth: (cm) 0.1 Area: (cm) 5.184 Volume: (cm) 0.518 % Reduction in Area: -2096.6% % Reduction in Volume: -2058.3% Wound Description Classification: Partial Thickness Treatment Notes Wound #5 (Left, Proximal Lower Leg) Notes xsorb 4 layer Electronic Signature(s) Signed: 12/13/2019 4:25:15 PM By: Montey Hora Entered By: Montey Hora on 12/13/2019 16:22:34

## 2019-12-16 ENCOUNTER — Ambulatory Visit: Payer: Medicare Other | Admitting: Physician Assistant

## 2019-12-17 ENCOUNTER — Other Ambulatory Visit: Payer: Self-pay

## 2019-12-17 ENCOUNTER — Encounter: Payer: Medicare Other | Admitting: Physician Assistant

## 2019-12-17 DIAGNOSIS — L97812 Non-pressure chronic ulcer of other part of right lower leg with fat layer exposed: Secondary | ICD-10-CM | POA: Diagnosis not present

## 2019-12-17 NOTE — Progress Notes (Addendum)
SALIMATA, WAIBEL ([id number]) Visit Report for 12/17/2019 Chief Complaint Document Details Patient Name: [name], [name]. Date of Service: 12/17/2019 2:45 PM Medical Record Number: [id number] Patient Account Number: [id number] Date of Birth/Sex: [birth year] (64 y.o. F) Treating RN: Montey Hora Primary Care Provider: Neta Ehlers Other Clinician: Referring Provider: Neta Ehlers Treating Provider/Extender: Melburn Hake, Susannah Carbin Weeks in Treatment: 16 Information Obtained from: Patient Chief Complaint Bilateral LE Lymphedema and ulcers Electronic Signature(s) Signed: 12/17/2019 2:52:41 PM By: Worthy Keeler PA-C Entered By: Worthy Keeler on 12/17/2019 14:52:41 [place], [place] ([id number]) -------------------------------------------------------------------------------- HPI Details Patient Name: Martha Downs L. Date of Service: 12/17/2019 2:45 PM Medical Record Number: [id number] Patient Account Number: [id number] Date of Birth/Sex: 1955/09/13 (64 y.o. F) Treating RN: Montey Hora Primary Care Provider: Neta Ehlers Other Clinician: Referring Provider: Neta Ehlers Treating Provider/Extender: Melburn Hake, Jonh Mcqueary Weeks in Treatment: 16 History of Present Illness HPI Description: 08/27/2019 patient presents today for initial evaluation here in our clinic concerning issues that she has been having with her bilateral lower extremities with lymphedema and weeping. She is previously been a patient over the past year of Dr. Jerilee Hoh at the wound care center in [facility]. With that being said she subsequently was recommended by the lymphedema clinic here in [place] to follow-up with [place] here instead of going back to [place] since they were in closer contact with the clinic here than they are obviously. With that being said the patient states that is why she came her way in order to be evaluated at this point. Fortunately there is no signs of active infection at this time. No  fevers, chills, nausea, vomiting, or diarrhea. The patient does have a history of lymphedema, hypertension, long-term use of anticoagulant therapy, and obstructive sleep apnea. During the visit today and from checking into the discharge she was prone to unexpectedly falling asleep. I did discuss this with her daughter as well who states that this is very common for her she states she is not really sure how compliant her mother is with her CPAP machine that may be part of the issue. The patient did have ABIs performed which revealed have copies of his well today that was on 05/01/2019 and it showed a right ABI of 1.17 with a TBI of 1.12 and a left ABI of 1.24 with a TBI of 0.96. She has had 2 transfusions in the past 2 weeks apparently as well. 08/31/2019 upon evaluation today patient actually appears to be doing somewhat better in regard to her lower extremity ulcers. She still has a tremendous amount of drainage from the left lower extremity unfortunately but her right lower extremity is doing much better which is great news. Overall I am very pleased in this regard. With regard to home health we finally got things settled so home health will be coming out to see her this would be very beneficial in my opinion as I think they will be able to help her tremendously with more frequent dressing changes. 12/28-Patient is back here at 1 week visit, with regard to her bilateral lower extremity ulcers, she has a significant degree of lymphedema, significant degree of drainage more from the left and which continues to be an issue, patient had not been using her lymphedema pumps as recommended, she is concerned about amount of drainage and how quickly it soaks the dressing, she is concerned about the lymphedema pump sticking to her skin and was told to use it on the wraps.  She will get in touch with her cardiologist about adjusting her Lasix dose. 09/14/2019 on evaluation today patient appears to be doing poorly  in regard to her lower extremities especially left lower extremity. Unfortunately she is continue to have a significant amount of drainage which is preventing her from being able to heal keeping the wounds very macerated. She also has some warmth to touch in the anterior to medial portion of the left lower extremity compared to the remainder of the wound which does have made somewhat concerned about the possibility of cellulitis. Obviously it can be difficult to determine just strictly lymphedema versus cellulitis but right now I am somewhat concerned. She tells me that she can put a wrap on and at least has to change this twice a day or else she will be walking around leaving puddles of water behind her. Obviously this is fairly significant. She did see her primary care provider they did not see anything going on that they felt needed to be addressed from a diuretic standpoint but nonetheless I am still somewhat concerned based on what we are seeing to be honest. The patient and her husband are likewise concerned. 09/17/2019 patient is seen today in follow-up I last saw her on Tuesday at that point based on the severity of her lower extremity edema, weeping, and erythema I did send her to the ER for further evaluation and treatment. She subsequently did go for that evaluation. It did appear that based on my review of the notes this is dated 09/14/2019 that the patient did have a BNP of 149.4. Which was notable. Subsequently she was given a dose of IV vancomycin as well as doxycycline and Keflex. It appears however that she was discharged on oral clindamycin 150 mg every 6 hours and to be honest compared to what I saw on Tuesday she actually seems to be doing somewhat better. Fortunately there is no evidence of active infection at this time which is good news. In the interim she did yesterday see her cardiologist as well Dr. Bettina Gavia. That was on 09/16/2019. Upon review of this note it does appear that his  recommendation at this point was to increase the furosemide to 40 mg 2 times a day. He also recommend an increase of potassium 10 mg taken 2 times a day. His other recommendation was that the patient needs to have a follow-up with her hematology oncologist in order to see if they can do anything about iron transfusions for her as unfortunately her iron is still quite low and obviously oral supplementation is not going to be the best way to get this up. 09/24/2019 upon evaluation today patient appears to be doing quite well with regard to her lower extremities. The right lower extremity in particular has dramatically improved to be honest so has the left in my opinion. There does not appear to be any signs of active infection which is good news. No fevers, chills, nausea, vomiting, or diarrhea. Patient is still taking the clindamycin at this time. 10/01/2019 on evaluation today patient appears to be showing some signs of improvement at this point in regard to her lower extremities. The right lower extremity is doing quite well the left lower extremity is still draining but again I do believe this is significantly better compared to what it was in the past. Fortunately there is no evidence of active infection at this time. No fever chills noted 10/08/2019 upon evaluation today patient appears to have smaller areas of erythema and  weeping on the left lower extremity as compared to last week's evaluation. The right leg is doing excellent. With that being said unfortunately she is still having a lot of significant swelling on the left in fact the swelling is worse today than it was last week. With that being said she is telling me that she is able to keep her wraps on now for at least the day sometimes even up to 30 hours before they are having to be changed. When they do have to change it they are using her lymphedema wraps at home to reapply what they can. She only changes that she tells me when it actually  gets to the point that is draining so much that it leaks onto her floor. 10/15/2019 upon evaluation today patient appears to actually be doing very well with regard to her lower extremities in fact I believe the right lower extremity may be completely sealed up that we will likely watch it 1 more week. The left lower extremity is doing better she does not seem to be having as much weeping and leaking but still this is going on. Home health still has not gotten the [place] there is still just using ABD pads I believe was able [place], Iram L. ([id number]) to get Lauraine Rinne this would actually help out. 10/21/2019 upon evaluation today patient appears to be doing much better in regard to her lower extremities bilaterally. The right lower extremity has 1 area remaining that still draining the left lower extremity is still draining but not nearly what it was in the past. Overall I am extremely pleased with how things seem to be progressing. 10/29/2019 upon evaluation today patient actually appears to be doing quite well in regard to her lower extremities bilaterally both extremities show signs of improvement. The biggest issue we seem to be having is that unfortunately the home health nurse does not seem to be wrapping this quite right in that it seems to slide down within just a few hours of them coming out to rewrap her according to the patient and her husband. The wraps we put on the state last much longer in fact generally until they are taken off. Fortunately there is no signs of infection although unfortunately she does still have a few open areas on both lower extremities at this time. 11/12/2019 upon evaluation today patient appears to be doing excellent in regard to her wounds. She does not have weeping like she is had in the past and overall I feel like she is doing quite well. With that being said this is something that has continued to give her trouble and that she states that the wrap is sliding  down especially when home health puts this on. She tells me that the ones we put on do not seem to be sliding down at this point. Nonetheless I think that this is definitely something of concern. Obviously if it slides down can cause trouble with getting the area to effectively heal and continue to do well. 11/19/2019 upon evaluation today patient actually appears to be doing very well with regard to her bilateral lower extremities. She just has a couple small areas 1 on the right 2 on the left that are still open at this point and all seem to be headed in the right direction. She is no longer saturating her wraps which is also good news. Overall I am extremely pleased with the progress that is been made up to this point. I do believe we  may be able to switch into her lymphedema wraps at this point. 12/09/2019 unfortunately the patient appears to be doing more poorly this week in regard to her legs. We actually transitioned her to her lymphedema wraps last week although I do not believe they are controlling her edema well enough. She has areas that have reopened on the right and just a very small weeping region in fact there is not even really a wound here. On the left she has wounds that have reopened and appear to be doing worse she is weeping quite significantly. I think Lauraine Rinne is good to be the ideal thing here I do not even think we can use the alginate as I am afraid that we just become too moist at this point. Fortunately there is no signs of active infection at this time. She is on doxycycline for completely separate issue dealing with a uterine fibroid. 12/17/2019 upon evaluation today patient actually appears to be doing excellent in regard to her wounds currently of the left lower extremity. These are all measuring better and looking well. The right lower extremity is completely sealed up and doing excellent. Overall very pleased with how things seem to be progressing. Her swelling is also  down from last week. Electronic Signature(s) Signed: 12/17/2019 3:48:42 PM By: Worthy Keeler PA-C Entered By: Worthy Keeler on 12/17/2019 15:48:41 [place], [name] ([id number]) -------------------------------------------------------------------------------- Physical Exam Details Patient Name: [name], [name] Date of Service: 12/17/2019 2:45 PM Medical Record Number: [id number] Patient Account Number: [id number] Date of Birth/Sex: [birth year] (64 y.o. F) Treating RN: Montey Hora Primary Care Provider: Neta Ehlers Other Clinician: Referring Provider: Neta Ehlers Treating Provider/Extender: Melburn Hake, Joden Bonsall Weeks in Treatment: 16 Constitutional Obese and well-hydrated in no acute distress. Respiratory normal breathing without difficulty. Psychiatric this patient is able to make decisions and demonstrates good insight into disease process. Alert and Oriented x 3. pleasant and cooperative. Notes Patient's wound bed currently showed signs of good granulation at this time there does not appear to be evidence of active infection and overall I think she is doing better from the standpoint of her swelling as compared to previous. 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 Electronic Signature(s) Signed: 12/17/2019 3:48:59 PM By: Worthy Keeler PA-C Entered By: Worthy Keeler on 12/17/2019 15:48:59 [name], [name] ([id number]) -------------------------------------------------------------------------------- Physician Orders Details Patient Name: Martha Siren. Date of Service: 12/17/2019 2:45 PM Medical Record Number: [id number] Patient Account Number: [id number] Date of Birth/Sex: November 29, 1955 (64 y.o. 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 Wound #5 Left,Proximal Lower Leg o Cleanse wound with mild soap and water o Dial antibacterial soap, wash wounds, rinse and pat dry prior to dressing wounds o May Shower, gently pat wound dry prior to applying new dressing. Skin Barriers/Peri-Wound Care Wound #1 Left,Medial,Anterior Lower  Leg o Triamcinolone Acetonide Ointment (TCA) - to either leg on places that itch - ask patient Wound #5 Left,Proximal Lower Leg o Triamcinolone Acetonide Ointment (TCA) - to either leg on places that itch - ask patient Primary Wound Dressing Wound #1 Left,Medial,Anterior Lower Leg o XtraSorb Wound #5 Left,Proximal Lower Leg o XtraSorb Dressing Change Frequency Wound #1 Left,Medial,Anterior Lower Leg o Change dressing every week - Nurse visit as needed Wound #5 Left,Proximal Lower Leg o Change dressing every week - Nurse visit as needed Follow-up Appointments Wound #1 Left,Medial,Anterior Lower Leg o Return Appointment in 1 week. Wound #5 Left,Proximal Lower Leg o Return Appointment in 1 week. Edema Control Wound #1 Left,Medial,Anterior Lower Leg o 4 Layer Compression System - Bilateral o Elevate legs to the level of the heart and pump ankles as often as possible [name], [name] o Compression Pump: Use compression pump on left lower extremity for 60 minutes, twice daily. o Compression Pump: Use compression pump on right  lower extremity for 60 minutes, twice daily. Wound #5 Left,Proximal Lower Leg o 4 Layer Compression System - Bilateral o Elevate legs to the level of the heart and pump ankles as often as possible o Compression Pump: Use compression pump on left lower extremity for 60 minutes, twice daily. o Compression Pump: Use compression pump on right lower extremity for 60 minutes, twice daily. Electronic Signature(s) Signed: 12/17/2019 3:27:22 PM By: Montey Hora Signed: 12/17/2019 3:51:06 PM By: Worthy Keeler PA-C Entered By: Montey Hora on 12/17/2019 15:05:56 [name], [name] ([id number]) -------------------------------------------------------------------------------- Problem List Details Patient Name: EIMY, MOSQUERA L. Date of Service: 12/17/2019 2:45 PM Medical Record Number: [id number] Patient Account Number: [id number] Date of Birth/Sex: [birth year] (64 y.o. F) Treating RN: Montey Hora Primary Care Provider: Neta Ehlers Other Clinician: Referring Provider: Neta Ehlers Treating Provider/Extender: Melburn Hake, Mariadejesus Cade Weeks in Treatment: 16 Active Problems ICD-10 Evaluated Encounter Code Description Active Date Today Diagnosis I89.0 Lymphedema, not elsewhere classified 08/27/2019 No Yes L97.812 Non-pressure chronic ulcer of other part of right lower leg with fat layer 08/27/2019 No Yes exposed L97.822 Non-pressure chronic ulcer of other part of left lower leg with fat layer 08/27/2019 No Yes exposed [place] (primary) hypertension 08/27/2019 No Yes Z79.01 Long term (current) use of anticoagulants 08/27/2019 No Yes Inactive Problems Resolved Problems Electronic Signature(s) Signed: 12/17/2019 2:52:33 PM By: Worthy Keeler PA-C Entered By: Worthy Keeler on 12/17/2019 14:52:32 [name], [name] ([id number]) -------------------------------------------------------------------------------- Progress Note Details Patient Name: Martha Downs L. Date of Service: 12/17/2019 2:45  PM Medical Record Number: [id number] Patient Account Number: [id number] Date of Birth/Sex: 03/24/1956 (64 y.o. F) Treating RN: Montey Hora Primary Care Provider: Neta Ehlers Other Clinician: Referring Provider: Neta Ehlers Treating Provider/Extender: Melburn Hake, Merilyn Pagan Weeks in Treatment: 16 Subjective Chief Complaint Information obtained from Patient Bilateral LE Lymphedema and ulcers History of Present Illness (HPI) 08/27/2019 patient presents today for initial evaluation here in our clinic concerning issues that she has been having with her bilateral lower extremities with lymphedema and weeping. She is previously been a patient over the past year of Dr. Jerilee Hoh at the wound care center in [facility]. With that being said she subsequently was recommended by the lymphedema clinic here in [place] to follow-up with [place] here instead of going back to [place] since they were in closer contact with the clinic here than they are obviously. With that being said the patient states that is why she came her way in order to be  evaluated at this point. Fortunately there is no signs of active infection at this time. No fevers, chills, nausea, vomiting, or diarrhea. The patient does have a history of lymphedema, hypertension, long-term use of anticoagulant therapy, and obstructive sleep apnea. During the visit today and from checking into the discharge she was prone to unexpectedly falling asleep. I did discuss this with her daughter as well who states that this is very common for her she states she is not really sure how compliant her mother is with her CPAP machine that may be part of the issue. The patient did have ABIs performed which revealed have copies of his well today that was on 05/01/2019 and it showed a right ABI of 1.17 with a TBI of 1.12 and a left ABI of 1.24 with a TBI of 0.96. She has had 2 transfusions in the past 2 weeks apparently as well. 08/31/2019 upon  evaluation today patient actually appears to be doing somewhat better in regard to her lower extremity ulcers. She still has a tremendous amount of drainage from the left lower extremity unfortunately but her right lower extremity is doing much better which is great news. Overall I am very pleased in this regard. With regard to home health we finally got things settled so home health will be coming out to see her this would be very beneficial in my opinion as I think they will be able to help her tremendously with more frequent dressing changes. 12/28-Patient is back here at 1 week visit, with regard to her bilateral lower extremity ulcers, she has a significant degree of lymphedema, significant degree of drainage more from the left and which continues to be an issue, patient had not been using her lymphedema pumps as recommended, she is concerned about amount of drainage and how quickly it soaks the dressing, she is concerned about the lymphedema pump sticking to her skin and was told to use it on the wraps. She will get in touch with her cardiologist about adjusting her Lasix dose. 09/14/2019 on evaluation today patient appears to be doing poorly in regard to her lower extremities especially left lower extremity. Unfortunately she is continue to have a significant amount of drainage which is preventing her from being able to heal keeping the wounds very macerated. She also has some warmth to touch in the anterior to medial portion of the left lower extremity compared to the remainder of the wound which does have made somewhat concerned about the possibility of cellulitis. Obviously it can be difficult to determine just strictly lymphedema versus cellulitis but right now I am somewhat concerned. She tells me that she can put a wrap on and at least has to change this twice a day or else she will be walking around leaving puddles of water behind her. Obviously this is fairly significant. She did see her  primary care provider they did not see anything going on that they felt needed to be addressed from a diuretic standpoint but nonetheless I am still somewhat concerned based on what we are seeing to be honest. The patient and her husband are likewise concerned. 09/17/2019 patient is seen today in follow-up I last saw her on Tuesday at that point based on the severity of her lower extremity edema, weeping, and erythema I did send her to the ER for further evaluation and treatment. She subsequently did go for that evaluation. It did appear that based on my review of the notes this is dated 09/14/2019 that the patient  did have a BNP of 149.4. Which was notable. Subsequently she was given a dose of IV vancomycin as well as doxycycline and Keflex. It appears however that she was discharged on oral clindamycin 150 mg every 6 hours and to be honest compared to what I saw on Tuesday she actually seems to be doing somewhat better. Fortunately there is no evidence of active infection at this time which is good news. In the interim she did yesterday see her cardiologist as well Dr. Bettina Gavia. That was on 09/16/2019. Upon review of this note it does appear that his recommendation at this point was to increase the furosemide to 40 mg 2 times a day. He also recommend an increase of potassium 10 mg taken 2 times a day. His other recommendation was that the patient needs to have a follow-up with her hematology oncologist in order to see if they can do anything about iron transfusions for her as unfortunately her iron is still quite low and obviously oral supplementation is not going to be the best way to get this up. 09/24/2019 upon evaluation today patient appears to be doing quite well with regard to her lower extremities. The right lower extremity in particular has dramatically improved to be honest so has the left in my opinion. There does not appear to be any signs of active infection which is good news. No fevers,  chills, nausea, vomiting, or diarrhea. Patient is still taking the clindamycin at this time. 10/01/2019 on evaluation today patient appears to be showing some signs of improvement at this point in regard to her lower extremities. The right lower extremity is doing quite well the left lower extremity is still draining but again I do believe this is significantly better compared to what it was in the past. Fortunately there is no evidence of active infection at this time. No fever chills noted 10/08/2019 upon evaluation today patient appears to have smaller areas of erythema and weeping on the left lower extremity as compared to last week's evaluation. The right leg is doing excellent. With that being said unfortunately she is still having a lot of significant swelling on the left in fact the swelling is worse today than it was last week. With that being said she is telling me that she is able to keep her wraps on now for at least the day sometimes even up to 30 hours before they are having to be changed. When they do have to change it they are using her lymphedema wraps at home [place], [place]. ([id number]) to reapply what they can. She only changes that she tells me when it actually gets to the point that is draining so much that it leaks onto her floor. 10/15/2019 upon evaluation today patient appears to actually be doing very well with regard to her lower extremities in fact I believe the right lower extremity may be completely sealed up that we will likely watch it 1 more week. The left lower extremity is doing better she does not seem to be having as much weeping and leaking but still this is going on. Home health still has not gotten the [place] there is still just using ABD pads I believe was able to get Lauraine Rinne this would actually help out. 10/21/2019 upon evaluation today patient appears to be doing much better in regard to her lower extremities bilaterally. The right lower extremity has 1 area  remaining that still draining the left lower extremity is still draining but not nearly what it  was in the past. Overall I am extremely pleased with how things seem to be progressing. 10/29/2019 upon evaluation today patient actually appears to be doing quite well in regard to her lower extremities bilaterally both extremities show signs of improvement. The biggest issue we seem to be having is that unfortunately the home health nurse does not seem to be wrapping this quite right in that it seems to slide down within just a few hours of them coming out to rewrap her according to the patient and her husband. The wraps we put on the state last much longer in fact generally until they are taken off. Fortunately there is no signs of infection although unfortunately she does still have a few open areas on both lower extremities at this time. 11/12/2019 upon evaluation today patient appears to be doing excellent in regard to her wounds. She does not have weeping like she is had in the past and overall I feel like she is doing quite well. With that being said this is something that has continued to give her trouble and that she states that the wrap is sliding down especially when home health puts this on. She tells me that the ones we put on do not seem to be sliding down at this point. Nonetheless I think that this is definitely something of concern. Obviously if it slides down can cause trouble with getting the area to effectively heal and continue to do well. 11/19/2019 upon evaluation today patient actually appears to be doing very well with regard to her bilateral lower extremities. She just has a couple small areas 1 on the right 2 on the left that are still open at this point and all seem to be headed in the right direction. She is no longer saturating her wraps which is also good news. Overall I am extremely pleased with the progress that is been made up to this point. I do believe we may be able  to switch into her lymphedema wraps at this point. 12/09/2019 unfortunately the patient appears to be doing more poorly this week in regard to her legs. We actually transitioned her to her lymphedema wraps last week although I do not believe they are controlling her edema well enough. She has areas that have reopened on the right and just a very small weeping region in fact there is not even really a wound here. On the left she has wounds that have reopened and appear to be doing worse she is weeping quite significantly. I think Lauraine Rinne is good to be the ideal thing here I do not even think we can use the alginate as I am afraid that we just become too moist at this point. Fortunately there is no signs of active infection at this time. She is on doxycycline for completely separate issue dealing with a uterine fibroid. 12/17/2019 upon evaluation today patient actually appears to be doing excellent in regard to her wounds currently of the left lower extremity. These are all measuring better and looking well. The right lower extremity is completely sealed up and doing excellent. Overall very pleased with how things seem to be progressing. Her swelling is also down from last week. Objective Constitutional Obese and well-hydrated in no acute distress. Vitals Time Taken: 2:45 PM, Height: 62 in, Weight: 290 lbs, BMI: 53, Temperature: 98.9 F, Pulse: 92 bpm, Respiratory Rate: 20 breaths/min, Blood Pressure: 164/68 mmHg. Respiratory normal breathing without difficulty. Psychiatric this patient is able to make decisions and demonstrates good  insight into disease process. Alert and Oriented x 3. pleasant and cooperative. General Notes: Patient's wound bed currently showed signs of good granulation at this time there does not appear to be evidence of active infection and overall I think she is doing better from the standpoint of her swelling as compared to previous. 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 Integumentary (Hair, Skin) Wound #1 status is Open. Original cause of wound was Gradually Appeared. The wound is located on the Left,Medial,Anterior Lower Leg. The wound measures 3.4cm length x 3cm width x 0.1cm depth; 8.011cm^2 area and 0.801cm^3 volume. There is Fat Layer (Subcutaneous Tissue) Exposed exposed. There is a medium amount of serosanguineous drainage noted. There is large (67-100%) red granulation within the wound bed. There is a small (1-33%) amount of necrotic tissue within the wound bed including Adherent Slough. [name], [name] ([id number]) Wound #4 status is Open. Original cause of wound was Gradually Appeared. The wound is located on the Right,Posterior Lower Leg. The wound measures 0cm length x 0cm width x 0cm depth; 0cm^2 area and 0cm^3 volume. There is no tunneling or undermining noted. There is a none present amount of drainage noted. There is no granulation within the wound bed. There is a large (67-100%) amount of necrotic tissue within the wound bed including Eschar. Wound #5 status is Open. Original cause of wound was Gradually Appeared. The wound is located on the Left,Proximal Lower Leg. The wound measures 0.9cm length x 8cm width x 0.1cm depth; 5.655cm^2 area and 0.565cm^3 volume. There is Fat Layer (Subcutaneous Tissue) Exposed exposed. There is no tunneling or undermining noted. There is a medium amount of serosanguineous drainage noted. There is large (67-100%) red granulation within the wound bed. There is a small (1-33%) amount of necrotic tissue within the wound bed including Adherent Slough. Assessment Active Problems ICD-10 Lymphedema, not elsewhere classified Non-pressure chronic ulcer of other part of right lower leg with fat layer exposed Non-pressure chronic ulcer of other part of left lower leg with fat layer exposed Essential (primary) hypertension Long term (current) use of anticoagulants Procedures Wound #1 Pre-procedure diagnosis of  Wound #1 is a Lymphedema located on the Left,Medial,Anterior Lower Leg . There was a Four Layer Compression Therapy Procedure with a pre-treatment ABI of 1.2 by Montey Hora, RN. Post procedure Diagnosis Wound #1: Same as Pre-Procedure Wound #5 Pre-procedure diagnosis of Wound #5 is a Lymphedema located on the Left,Proximal Lower Leg . There was a Four Layer Compression Therapy Procedure with a pre-treatment ABI of 1.2 by Montey Hora, RN. Post procedure Diagnosis Wound #5: Same as Pre-Procedure Plan Wound Cleansing: Wound #1 Left,Medial,Anterior Lower Leg: Cleanse wound with mild soap and water Dial antibacterial soap, wash wounds, rinse and pat dry prior to dressing wounds May Shower, gently pat wound dry prior to applying new dressing. Wound #5 Left,Proximal Lower Leg: Cleanse wound with mild soap and water Dial antibacterial soap, wash wounds, rinse and pat dry prior to dressing wounds May Shower, gently pat wound dry prior to applying new dressing. Skin Barriers/Peri-Wound Care: Wound #1 Left,Medial,Anterior Lower Leg: Triamcinolone Acetonide Ointment (TCA) - to either leg on places that itch - ask patient Wound #5 Left,Proximal Lower Leg: Triamcinolone Acetonide Ointment (TCA) - to either leg on places that itch - ask patient Primary Wound Dressing: Wound #1 Left,Medial,Anterior Lower Leg: XtraSorb Wound #5 Left,Proximal Lower Leg: [name], [name] Dressing Change Frequency: Wound #1 Left,Medial,Anterior Lower Leg: Change dressing every week - Nurse visit as needed Wound #5 Left,Proximal  Lower Leg: Change dressing every week - Nurse visit as needed Follow-up Appointments: Wound #1 Left,Medial,Anterior Lower Leg: Return Appointment in 1 week. Wound #5 Left,Proximal Lower Leg: Return Appointment in 1 week. Edema Control: Wound #1 Left,Medial,Anterior Lower Leg: 4 Layer Compression System - Bilateral Elevate legs to the level of the heart and pump  ankles as often as possible Compression Pump: Use compression pump on left lower extremity for 60 minutes, twice daily. Compression Pump: Use compression pump on right lower extremity for 60 minutes, twice daily. Wound #5 Left,Proximal Lower Leg: 4 Layer Compression System - Bilateral Elevate legs to the level of the heart and pump ankles as often as possible Compression Pump: Use compression pump on left lower extremity for 60 minutes, twice daily. Compression Pump: Use compression pump on right lower extremity for 60 minutes, twice daily. 1. My suggestion at this time is can be that we going continue with the current wound care measures specifically with regard to the XtraSorb for the left lower extremity along with bilateral 4-layer compression wraps. We did really do not need any dressings as there is no open wounds on the right leg just the compression wrap. 2. I am also can recommend she continue to elevate her legs much as possible. Obviously the more she can do this the better off she will be. 3. I am also going to suggest that she avoid sitting with her legs crossed or with him dangling obviously that can also be no good for her. We will see patient back for reevaluation in 1 week here in the clinic. If anything worsens or changes patient will contact our office for additional recommendations. Electronic Signature(s) Signed: 12/17/2019 3:49:58 PM By: Worthy Keeler PA-C Entered By: Worthy Keeler on 12/17/2019 15:49:57 [name], [name] ([id number]) -------------------------------------------------------------------------------- SuperBill Details Patient Name: Martha Siren. Date of Service: 12/17/2019 Medical Record Number: [id number] Patient Account Number: [id number] Date of Birth/Sex: Jan 14, 1956 (64 y.o. F) Treating RN: Montey Hora Primary Care Provider: Neta Ehlers Other Clinician: Referring Provider: Neta Ehlers Treating Provider/Extender: Melburn Hake, Zara Wendt Weeks in  Treatment: 16 Diagnosis Coding ICD-10 Codes Code Description I89.0 Lymphedema, not elsewhere classified L97.812 Non-pressure chronic ulcer of other part of right lower leg with fat layer exposed L97.822 Non-pressure chronic ulcer of other part of left lower leg with fat layer exposed [place] (primary) hypertension Z79.01 Long term (current) use of anticoagulants Facility Procedures CPT4: Description Modifier Quantity Code [id number] BILATERAL: Application of multi-layer venous compression system; leg (below knee), including ankle 1 and foot. Physician Procedures CPT4 Code: [id number] Description: [id number] - WC PHYS LEVEL 3 - EST PT Modifier: Quantity: 1 CPT4 Code: Description: ICD-10 Diagnosis Description I89.0 Lymphedema, not elsewhere classified [id number] Non-pressure chronic ulcer of other part of right lower leg with fat layer L97.822 Non-pressure chronic ulcer of other part of left lower leg with fat layer e  I10 Essential (primary) hypertension Modifier: exposed xposed Quantity: Electronic Signature(s) Signed: 12/17/2019 3:50:13 PM By: Worthy Keeler PA-C Previous Signature: 12/17/2019 3:27:22 PM Version By: Montey Hora Entered By: Worthy Keeler on 12/17/2019 15:50:12

## 2019-12-17 NOTE — Progress Notes (Signed)
JWAN, YELL ([id number]) Visit Report for 12/17/2019 Arrival Information Details Patient Name: [name], [name]. Date of Service: 12/17/2019 2:45 PM Medical Record Number: [id number] Patient Account Number: [id number] Date of Birth/Sex: 09/08/1956 (64 y.o. F) Treating RN: [name] Primary Care [name] Other Clinician: Referring [name] Treating [name]/Extender: [name] Weeks in Treatment: 16 Visit Information History Since Last Visit Added or deleted any medications: No Patient Arrived: Ambulatory Any new allergies or adverse reactions: No Arrival Time: 14:46 Had a fall or experienced change in No Accompanied By: husband activities of daily living that may affect Transfer Assistance: None risk of falls: Patient Identification Verified: Yes Signs or symptoms of abuse/neglect since last visito No Secondary Verification Process Completed: Yes Hospitalized since last visit: No Patient Has Alerts: Yes Implantable device outside of the clinic excluding No Patient Alerts: Patient on Blood Thinner cellular tissue based products placed in the center warfarin since last visit: Not Diabetic Has Dressing in Place as Prescribed: Yes 04/29/2019 [facility] Has Compression in Place as Prescribed: Yes ABI R)1.17 L)1.24 TBI R) 1.12 L) 0.96 Pain Present Now: No Electronic Signature(s) Signed: 12/17/2019 3:13:01 PM By: [name] Entered By: Martha Bears on 12/17/2019 14:47:52 [name], [name] ([id number]) -------------------------------------------------------------------------------- Compression Therapy Details Patient Name: Martha Downs L. Date of Service: 12/17/2019 2:45 PM Medical Record Number: [id number] Patient Account Number: [id number] Date of Birth/Sex: [birth year] (64 y.o. F) Treating RN: [name] Primary Care [name] Other Clinician: Referring Adalie Mand: [name] Treating Jaynell Castagnola/Extender: [name] Weeks in Treatment: 16 Compression Therapy Performed for Wound Assessment: Wound #1 Left,Medial,Anterior Lower Leg Performed By: Clinician Martha Hora, RN Compression Type: Four Layer Pre Treatment ABI: 1.2 Post Procedure Diagnosis Same as Pre-procedure Electronic Signature(s) Signed: 12/17/2019 3:27:22 PM By: [name] Entered By: [name] on 12/17/2019 15:05:21 [name], [name] -------------------------------------------------------------------------------- Compression Therapy Details Patient Name: Martha Downs L. Date of Service: 12/17/2019 2:45 PM Medical Record Number: [id number] Patient Account Number: [id number] Date of Birth/Sex: [birth year] (64 y.o. F) Treating RN: [name] Primary Care Gelene Recktenwald: [name] Other Clinician: Referring [name] Treating Amalee Olsen/Extender: [name] Weeks in Treatment: 16 Compression Therapy Performed for Wound Assessment: Wound #5 Left,Proximal Lower Leg Performed By: Clinician Martha Hora, RN Compression Type: Four Layer Pre Treatment ABI: 1.2 Post Procedure Diagnosis Same as Pre-procedure Electronic Signature(s) Signed: 12/17/2019 3:27:22 PM By: [name] Entered By: [name] on 12/17/2019 15:05:21 [name], [name] ([id number]) -------------------------------------------------------------------------------- Encounter Discharge Information Details Patient Name: [name], [name] Date of Service: 12/17/2019 2:45 PM Medical Record Number: [id number] Patient Account Number: [id number] Date of Birth/Sex: 1956-03-17 (64 y.o. F) Treating RN: [name] Primary Care Annalisse [name] Other Clinician: Referring [name] Treating [name]/Extender: [name] Weeks in Treatment: 16 Encounter Discharge Information Items Discharge Condition: Stable Ambulatory Status: Ambulatory Discharge  Destination: Home Transportation: Private Auto Accompanied By: spouse Schedule Follow-up Appointment: Yes Clinical Summary of Care: Electronic Signature(s) Signed: 12/17/2019 3:27:22 PM By: [name] Entered By: [name] on 12/17/2019 15:07:19 [name], [name] -------------------------------------------------------------------------------- Lower Extremity Assessment Details Patient Name: Martha Downs L. Date of Service: 12/17/2019 2:45 PM Medical Record Number: [id number] Patient Account Number: [id number] Date of Birth/Sex: [birth year] (64 y.o. F) Treating RN: [name] Primary Care [name] Other Clinician: Referring [name] Treating Tyde Lamison/Extender: [name] Weeks in Treatment: 16 Edema Assessment Assessed: [  Left: No] [Right: No] Edema: [Left: Yes] [Right: Yes] Calf Left: Right: Point of Measurement: 32 cm From Medial Instep 58 cm 49 cm Ankle Left: Right: Point of Measurement: 10 cm From Medial Instep 40 cm 29 cm Vascular Assessment Pulses: Dorsalis Pedis Palpable: [Left:Yes] [Right:Yes] Electronic Signature(s) Signed: 12/17/2019 3:04:53 PM By: [name] Entered By: [name] on 12/17/2019 14:59:13 [name], [name] ([id number]) -------------------------------------------------------------------------------- Multi Wound Chart Details Patient Name: Martha Downs L. Date of Service: 12/17/2019 2:45 PM Medical Record Number: [id number] Patient Account Number: [id number] Date of Birth/Sex: 02-08-1956 (64 y.o. F) Treating RN: [name] Primary Care [name] Other Clinician: Referring [name] Treating Treson Laura/Extender: [name] Weeks in Treatment: 16 Vital Signs Height(in): 73 Pulse(bpm): 25 Weight(lbs): 290 Blood Pressure(mmHg): 164/68 Body Mass Index(BMI): 53 Temperature(F): 98.9 Respiratory Rate(breaths/min): 20 Photos: Wound Location: Left,  Medial, Anterior Lower Leg Right, Posterior Lower Leg Left, Proximal Lower Leg Wounding Event: Gradually Appeared Gradually Appeared Gradually Appeared Primary Etiology: Lymphedema Lymphedema Lymphedema Comorbid History: Anemia, Coronary Artery Disease, Anemia, Coronary Artery Disease, Anemia, Coronary Artery Disease, Hypertension Hypertension Hypertension Date Acquired: 08/04/2019 10/21/2019 10/21/2019 Weeks of Treatment: 16 7 7  Wound Status: Open Open Open Measurements L x W x D (cm) 3.4x3x0.1 0x0x0 0.9x8x0.1 Area (cm) : 8.011 0 5.655 Volume (cm) : 0.801 0 0.565 % Reduction in Area: 99.20% 100.00% -2296.20% % Reduction in Volume: 99.20% 100.00% -2254.20% Classification: Partial Thickness Partial Thickness Partial Thickness Exudate Amount: Medium None Present Medium Exudate Type: Serosanguineous N/A Serosanguineous Exudate Color: red, brown N/A red, brown Granulation Amount: Large (67-100%) None Present (0%) Large (67-100%) Granulation Quality: Red N/A Red Necrotic Amount: Small (1-33%) Large (67-100%) Small (1-33%) Necrotic Tissue: Adherent [place] Exposed Structures: Fat Layer (Subcutaneous Tissue) Fascia: No Fat Layer (Subcutaneous Tissue) Exposed: Yes Fat Layer (Subcutaneous Tissue) Exposed: Yes Fascia: No Exposed: No Fascia: No Tendon: No Tendon: No Tendon: No Muscle: No Muscle: No Muscle: No Joint: No Joint: No Joint: No Bone: No Bone: No Bone: No Epithelialization: N/A Large (67-100%) None Treatment Notes Electronic Signature(s) Signed: 12/17/2019 3:27:22 PM By: [name] Entered By: [name] on 12/17/2019 15:02:37 [place], [name] ([id number]) [place], [name] ([id number]) -------------------------------------------------------------------------------- [place] Details Patient Name: [name], [name]. Date of Service: 12/17/2019 2:45 PM Medical Record Number: [id number] Patient Account Number: [id number] Date of  Birth/Sex: 1956/03/21 (64 y.o. F) Treating RN: [name] Primary Care Astria Jordahl: [name] Other Clinician: Referring Emeric Novinger: [name] Treating Aslan Montagna/Extender: [name] Weeks in Treatment: 16 Active Inactive Abuse / Safety / Falls / Self Care Management Nursing Diagnoses: Potential for falls Goals: Patient will not experience any injury related to falls Date Initiated: 08/27/2019 Target Resolution Date: 11/20/2019 Goal Status: Active Interventions: Assess fall risk on admission and as needed Notes: Orientation to the Wound Care Program Nursing Diagnoses: Knowledge deficit related to the wound healing center program Goals: Patient/caregiver will verbalize understanding of the [place] Program Date Initiated: 08/27/2019 Target Resolution Date: 11/20/2019 Goal Status: Active Interventions: Provide education on orientation to the wound center Notes: Venous Leg Ulcer Nursing Diagnoses: Potential for venous Insuffiency (use before diagnosis confirmed) Goals: Patient will maintain optimal edema control Date Initiated: 08/27/2019 Target Resolution Date: 11/20/2019 Goal Status: Active Interventions: Assess peripheral edema status every visit. Notes: Wound/Skin Impairment Nursing Diagnoses: Impaired tissue integrity [place], [name] ([id number]) Goals: Ulcer/skin breakdown will heal within 14 weeks Date Initiated: 08/27/2019 Target Resolution Date: 11/20/2019 Goal Status: Active Interventions: Assess patient/caregiver  ability to obtain necessary supplies Assess patient/caregiver ability to perform ulcer/skin care regimen upon admission and as needed Assess ulceration(s) every visit Notes: Electronic Signature(s) Signed: 12/17/2019 3:27:22 PM By: [name] Entered By: [name] on 12/17/2019 15:02:08 [name], [name] ([id number]) -------------------------------------------------------------------------------- Pain Assessment  Details Patient Name: Martha Downs L. Date of Service: 12/17/2019 2:45 PM Medical Record Number: [id number] Patient Account Number: [id number] Date of Birth/Sex: [birth year] (64 y.o. F) Treating RN: [name] Primary Care Lycia Sachdeva: [name] Other Clinician: Referring Lonnetta Kniskern: [name] Treating Nathifa Ritthaler/Extender: [name] Weeks in Treatment: 16 Active Problems Location of Pain Severity and Description of Pain Patient Has Paino No Site Locations Pain Management and Medication Current Pain Management: Electronic Signature(s) Signed: 12/17/2019 3:04:53 PM By: [name] Entered By: [name] on 12/17/2019 14:54:37 [name], [name] ([id number]) -------------------------------------------------------------------------------- Patient/Caregiver Education Details Patient Name: Martha Siren. Date of Service: 12/17/2019 2:45 PM Medical Record Number: [id number] Patient Account Number: [id number] Date of Birth/Gender: 1956-08-02 (64 y.o. F) Treating RN: [name] Primary Care Physician: [name] Other Clinician: Referring Physician: Neta [name] Treating Physician/Extender: Sharalyn Ink in Treatment: 16 Education Assessment Education Provided To: Patient and Caregiver Education Topics Provided Venous: Handouts: Other: please keep wraps on Methods: Explain/Verbal Responses: State content correctly Electronic Signature(s) Signed: 12/17/2019 3:27:22 PM By: [name] Entered By: [name] on 12/17/2019 15:06:33 [name], [name] ([id number]) -------------------------------------------------------------------------------- Wound Assessment Details Patient Name: Martha Downs L. Date of Service: 12/17/2019 2:45 PM Medical Record Number: [id number] Patient Account Number: [id number] Date of Birth/Sex: 04-Mar-1956 (64 y.o. F) Treating RN: [name] Primary Care Teshaun Olarte: [name] Other Clinician: Referring Kourtnei Rauber: [name] Treating Melda Mermelstein/Extender: [name] Weeks in Treatment: 16 Wound Status Wound Number: 1 Primary Etiology: Lymphedema Wound Location: Left, Medial, Anterior Lower Leg Wound Status: Open Wounding Event: Gradually Appeared Comorbid History: Anemia, Coronary Artery Disease, Hypertension Date Acquired: 08/04/2019 Weeks Of Treatment: 16 Clustered Wound: No Photos Wound Measurements Length: (cm) 3.4 % Re Width: (cm) 3 % Re Depth: (cm) 0.1 Area: (cm) 8.011 Volume: (cm) 0.801 duction in Area: 99.2% duction in Volume: 99.2% Wound Description Classification: Partial Thickness Foul Exudate Amount: Medium Slou Exudate Type: Serosanguineous Exudate Color: red, brown Odor After Cleansing: No gh/Fibrino Yes Wound Bed Granulation Amount: Large (67-100%) Exposed Structure Granulation Quality: Red Fascia Exposed: No Necrotic Amount: Small (1-33%) Fat Layer (Subcutaneous Tissue) Exposed: Yes Necrotic Quality: Adherent Slough Tendon Exposed: No Muscle Exposed: No Joint Exposed: No Bone Exposed: No Treatment Notes Wound #1 (Left, Medial, Anterior Lower Leg) Notes xsorb 4 layer bilateral Electronic Signature(s) Signed: 12/17/2019 3:04:53 PM By: [name], [name] ([id number]) Entered By: [name] on 12/17/2019 14:56:23 [name], [name] ([id number]) -------------------------------------------------------------------------------- Wound Assessment Details Patient Name: Martha Downs L. Date of Service: 12/17/2019 2:45 PM Medical Record Number: [id number] Patient Account Number: [id number] Date of Birth/Sex: [birth year] (64 y.o. F) Treating RN: [name] Primary Care Heleena Miceli: [name] Other Clinician: Referring Brier Reid: [name] Treating Chanay Nugent/Extender: [name] Weeks in Treatment: 16 Wound Status Wound Number: 4 Primary Etiology: Lymphedema Wound Location: Right, Posterior Lower Leg Wound Status: Open Wounding Event: Gradually  Appeared Comorbid History: Anemia, Coronary Artery Disease, Hypertension Date Acquired: 10/21/2019 Weeks Of Treatment: 7 Clustered Wound: No Photos Wound Measurements Length: (cm) Width: (cm) Depth: (cm) Area: (cm) Volume: (cm) 0 % Reduction in Area: 100% 0 % Reduction in Volume: 100% 0 Epithelialization: Large (67-100%) 0 Tunneling: No 0 Undermining: No  Wound Description Classification: Partial Thickness Exudate Amount: None Present Foul Odor After Cleansing: No Slough/Fibrino No Wound Bed Granulation Amount: None Present (0%) Exposed Structure Necrotic Amount: Large (67-100%) Fascia Exposed: No Necrotic Quality: Eschar Fat Layer (Subcutaneous Tissue) Exposed: No Tendon Exposed: No Muscle Exposed: No Joint Exposed: No Bone Exposed: No Electronic Signature(s) Signed: 12/17/2019 3:04:53 PM By: [name] Entered By: [name] on 12/17/2019 14:56:51 [name], [name] ([id number]) -------------------------------------------------------------------------------- Wound Assessment Details Patient Name: Martha Downs L. Date of Service: 12/17/2019 2:45 PM Medical Record Number: [id number] Patient Account Number: [id number] Date of Birth/Sex: January 01, 1956 (64 y.o. F) Treating RN: [name] Primary Care Orlinda Slomski: [name] Other Clinician: Referring Jaryn Hocutt: [name] Treating Naydelin Ziegler/Extender: [name] Weeks in Treatment: 16 Wound Status Wound Number: 5 Primary Etiology: Lymphedema Wound Location: Left, Proximal Lower Leg Wound Status: Open Wounding Event: Gradually Appeared Comorbid History: Anemia, Coronary Artery Disease, Hypertension Date Acquired: 10/21/2019 Weeks Of Treatment: 7 Clustered Wound: No Photos Wound Measurements Length: (cm) 0.9 % Re Width: (cm) 8 % Re Depth: (cm) 0.1 Epit Area: (cm) 5.655 Tun Volume: (cm) 0.565 Und duction in Area: -2296.2% duction in Volume: -2254.2% helialization: None neling: No ermining: No Wound  Description Classification: Partial Thickness Foul Exudate Amount: Medium Slou Exudate Type: Serosanguineous Exudate Color: red, brown Odor After Cleansing: No gh/Fibrino Yes Wound Bed Granulation Amount: Large (67-100%) Exposed Structure Granulation Quality: Red Fascia Exposed: No Necrotic Amount: Small (1-33%) Fat Layer (Subcutaneous Tissue) Exposed: Yes Necrotic Quality: Adherent Slough Tendon Exposed: No Muscle Exposed: No Joint Exposed: No Bone Exposed: No Treatment Notes Wound #5 (Left, Proximal Lower Leg) Notes xsorb 4 layer bilateral Electronic Signature(s) Signed: 12/17/2019 3:04:53 PM By: [name], [name] ([id number]) Entered By: [name] on 12/17/2019 14:57:26 [name], [name] ([id number]) -------------------------------------------------------------------------------- Vitals Details Patient Name: Martha Downs L. Date of Service: 12/17/2019 2:45 PM Medical Record Number: [id number] Patient Account Number: [id number] Date of Birth/Sex: [birth year] (64 y.o. F) Treating RN: [name] Primary Care Jaleen Grupp: [name] Other Clinician: Referring Djimon Lundstrom: [name] Treating Audi Conover/Extender: [name] Weeks in Treatment: 16 Vital Signs Time Taken: 14:45 Temperature (F): 98.9 Height (in): 62 Pulse (bpm): 92 Weight (lbs): 290 Respiratory Rate (breaths/min): 20 Body Mass Index (BMI): 53 Blood Pressure (mmHg): 164/68 Reference Range: 80 - 120 mg / dl Electronic Signature(s) Signed: 12/17/2019 3:13:01 PM By: [name] Entered By: Martha Bears on 12/17/2019 14:51:47

## 2019-12-22 NOTE — Progress Notes (Signed)
Virtual Visit via Video Note   This visit type was conducted due to national recommendations for restrictions regarding the COVID-19 Pandemic (e.g. social distancing) in an effort to limit this patient's exposure and mitigate transmission in our community.  Due to her co-morbid illnesses, this patient is at least at moderate risk for complications without adequate follow up.  This format is felt to be most appropriate for this patient at this time.  All issues noted in this document were discussed and addressed.  A limited physical exam was performed with this format.  Please refer to the patient's chart for her consent to telehealth for [facility].   The patient was identified using 2 identifiers.  Date:  12/23/2019   ID:  [name], DOB 10-Apr-1956, MRN [id number]  Patient Location: Home Provider Location: Office  PCP:  [name], MD  Cardiologist:  Shirlee More, MD   Electrophysiologist:  None   Evaluation Performed:  Follow-Up Visit  Chief Complaint: Congestive heart failure follow-up visit  History of Present Illness:    [name] is a 64 y.o. female with severe symptomatic aortic stenosis with heart failure and underwent 23 mm [place] SAVR on 03/30/13 Dr Clementeen Graham at [facility] , hypertension and heart failure. Recent TEE showed dynamic LVOT obstruction and normal AVR function. She was  diagnosed with  severe obstructive and central sleep apnea with nocturnal hypoxemia nadir 59% and initiated on CPAP which was changed to BiPAP.  Her other problems include nonhealing leg ulcers and dysfunctional uterine bleeding pending D&C.    She was admitted to [facility] with chest pain 03/30/2019.  Coronary angiography was normal.  She underwent left and right heart catheterization showing elevated left atrial pressure and increased gradient across her valve.  She previously underwent transesophageal echocardiogram and was found to have dynamic LV outflow tract obstruction.     She was admitted to [facility] 06/09/2019 with TIA underwent evaluation with transesophageal echocardiogram showing normal AVR function but TAVR protocol cardiac CTA implied leaflet thrombosis despite her severe anemia with dysfunctional uterine bleeding she was anticoagulated with warfarin.  Her hospital telemetry showed frequent PACs and brief runs of atrial tachycardia she was also noted to have a patent foramen ovale.  She follows with gynecology oncology and has had her iron stores repleted.  She had a televisit 11/18/2019. The patient does not have symptoms concerning for COVID-19 infection (fever, chills, cough, or new shortness of breath).   Recent labs 11/22/2019 hemoglobin is improved to 10.9 cells remain quite microcytic MCV of 74 and iron panel shows a low iron at 25 saturation at 8% proBNP level relatively low at 292 and potassium 3.8 creatinine 0.79 GFR greater than 90 cc.  Fortunately she continues to improve her weight is down another 10 to 15 pounds her legs are better the wound venous in the right leg is healed left lower extremity all but healed continues to follow-up with gynecology who is repleted her iron stores and improved her anemia and is compliant with her CPAP.  She is much less short of breath still has mild edema no orthopnea chest pain palpitation or syncope and remains anticoagulated with her TAVR valve dysfunction and TIA. Past Medical History:  Diagnosis Date  . Aortic stenosis 03/23/2013   Overview:  02/18/13 TTE EF >55%. Critical AS with mean Ao valve gradient of 82 mm Hg. No AI. No MR, PR, mild TR. Estimated RVSP 30 mm Hg.  . Bicuspid aortic  valve   . Edema of both legs 02/20/2017  . 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 Heart failure ([place])   . HTN (hypertension) 03/23/2013  . 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 Morbid obesity ([place]) 02/20/2017  . Sleep apnea    Past Surgical History:  Procedure Laterality Date  . BUBBLE STUDY  06/07/2019    Procedure: BUBBLE STUDY;  Surgeon: Buford Dresser, MD;  Location: [place];  Service: Cardiovascular;;  . CHOLECYSTECTOMY  1983  . RIGHT/LEFT HEART CATH AND CORONARY ANGIOGRAPHY N/A 04/01/2019   Procedure: RIGHT/LEFT HEART CATH AND CORONARY ANGIOGRAPHY;  Surgeon: Lorretta Harp, MD;  Location: [place] CV [facility];  Service: Cardiovascular;  Laterality: N/A;  . TEE WITHOUT CARDIOVERSION N/A 07/01/2018   Procedure: TRANSESOPHAGEAL ECHOCARDIOGRAM (TEE);  Surgeon: Buford Dresser, MD;  Location: [facility] ENDOSCOPY;  Service: Cardiovascular;  Laterality: N/A;  . TEE WITHOUT CARDIOVERSION N/A 06/07/2019   Procedure: TRANSESOPHAGEAL ECHOCARDIOGRAM (TEE);  Surgeon: Buford Dresser, MD;  Location: [facility] ENDOSCOPY;  Service: Cardiovascular;  Laterality: N/A;  . TISSUE AORTIC VALVE REPLACEMENT       Current Meds  Medication Sig  . camphor-menthol (SARNA) lotion Apply 1 application topically as needed for itching.  . furosemide (LASIX) 40 MG tablet Take 1 tablet (40 mg total) by mouth 2 (two) times daily.  . metoprolol tartrate (LOPRESSOR) 25 MG tablet Take 1 tablet (25 mg total) by mouth 2 (two) times daily.  . potassium chloride (KLOR-CON) 10 MEQ tablet Take 1 tablet (10 mEq total) by mouth 2 (two) times daily.  . pravastatin (PRAVACHOL) 40 MG tablet Take 1 tablet (40 mg total) by mouth daily at 6 PM.  . ramelteon (ROZEREM) 8 MG tablet Take 1 tablet (8 mg total) by mouth at bedtime.  . triamcinolone cream (KENALOG) 0.1 % APPLY THIN FILM OF CREAM OVER THE LEG WITH EACH WRAP CHANGE. NOT TO BE PLACED IN THE WOUNDS  . warfarin (COUMADIN) 2.5 MG tablet Take 1 tablet (2.5 mg total) by mouth daily.     Allergies:   Vancomycin   Social History   Tobacco Use  . Smoking status: Never Smoker  . Smokeless tobacco: Never Used  Substance Use Topics  . Alcohol use: No  . Drug use: No     Family Hx: The patient's family history includes Heart attack in her mother; Parkinson's disease in  her father.  ROS:   Please see the history of present illness.     All other systems reviewed and are negative.   Prior CV studies:   The following studies were reviewed today:  See history  Labs/Other Tests and Data Reviewed:    EKG:  No ECG reviewed.  Recent Labs: 02/12/2019: NT-Pro BNP 640 06/03/2019: ALT 18 06/05/2019: TSH 5.585 06/08/2019: Magnesium 2.2 09/14/2019: BUN 11; Creatinine, Ser 0.77; Hemoglobin 8.8; Platelets 362; Potassium 3.9; Sodium 137 09/15/2019: B Natriuretic Peptide 149.4   Recent Lipid Panel Lab Results  Component Value Date/Time   CHOL 123 06/04/2019 05:07 AM   CHOL 125 02/12/2019 11:16 AM   TRIG 111 06/04/2019 05:07 AM   HDL 30 (L) 06/04/2019 05:07 AM   HDL 37 (L) 02/12/2019 11:16 AM   CHOLHDL 4.1 06/04/2019 05:07 AM   LDLCALC 71 06/04/2019 05:07 AM   LDLCALC 64 02/12/2019 11:16 AM    Wt Readings from Last 3 Encounters:  12/23/19 265 lb (120.2 kg)  11/18/19 272 lb (123.4 kg)  10/22/19 275 lb (124.7 kg)     Objective:    Vital Signs:  BP (!) 143/83   Pulse 88   Ht 5\' 1"  (1.549 m)   Wt 265 lb (120.2 kg)   BMI 50.07 kg/m    VITAL SIGNS:  reviewed GEN:  no acute distress EYES:  sclerae anicteric, EOMI - Extraocular Movements Intact RESPIRATORY:  normal respiratory effort, symmetric expansion CARDIOVASCULAR:  no peripheral edema SKIN:  no rash, lesions or ulcers. MUSCULOSKELETAL:  no obvious deformities. NEURO:  alert and oriented x 3, no obvious focal deficit PSYCH:  normal affect Her entire demeanor is improved she is comfortable not breathless and has no pallor of her skin or membranes ASSESSMENT & PLAN:    1. Hypertensive heart disease with heart failure is improved continue her current diuretic sodium restriction and particularly has been aided by repleting her iron stores improving anemia and treating CPAP.  Recent labs are reassuring she will be seen in the advanced heart failure program in May due to her concerns that she may have  amyloidosis I suspect 12 lab work done I will plan to see her in July.  Continue to monitor her weight at home. 2. Anemia is improved she is taken oral iron after being repleted IV by gynecology 3. Status post bioprosthetic AVR stable she is anticoagulated because of leaflet thrombosis and TIA and will remain on warfarin. 4. Continue CPAP  COVID-19 Education: The signs and symptoms of COVID-19 were discussed with the patient and how to seek care for testing (follow up with PCP or arrange E-visit).  The importance of social distancing was discussed today.  Time:   Today, I have spent 20 minutes with the patient with telehealth technology discussing the above problems.     Medication Adjustments/Labs and Tests Ordered: Current medicines are reviewed at length with the patient today.  Concerns regarding medicines are outlined above.   Tests Ordered: No orders of the defined types were placed in this encounter.   Medication Changes: No orders of the defined types were placed in this encounter.   Follow Up:  Virtual Visit  July 2021 after advanced heart failure consultation next month  Signed, Shirlee More, MD  12/23/2019 11:50 AM    [place]

## 2019-12-23 ENCOUNTER — Encounter: Payer: Self-pay | Admitting: Cardiology

## 2019-12-23 ENCOUNTER — Encounter: Payer: Medicare Other | Admitting: Physician Assistant

## 2019-12-23 ENCOUNTER — Telehealth (INDEPENDENT_AMBULATORY_CARE_PROVIDER_SITE_OTHER): Payer: Medicare Other | Admitting: Cardiology

## 2019-12-23 ENCOUNTER — Other Ambulatory Visit: Payer: Self-pay

## 2019-12-23 VITALS — BP 143/83 | HR 88 | Ht 61.0 in | Wt 265.0 lb

## 2019-12-23 DIAGNOSIS — D508 Other iron deficiency anemias: Secondary | ICD-10-CM

## 2019-12-23 DIAGNOSIS — Z954 Presence of other heart-valve replacement: Secondary | ICD-10-CM | POA: Diagnosis not present

## 2019-12-23 DIAGNOSIS — I110 Hypertensive heart disease with heart failure: Secondary | ICD-10-CM

## 2019-12-23 DIAGNOSIS — Z7901 Long term (current) use of anticoagulants: Secondary | ICD-10-CM

## 2019-12-23 DIAGNOSIS — L97812 Non-pressure chronic ulcer of other part of right lower leg with fat layer exposed: Secondary | ICD-10-CM | POA: Diagnosis not present

## 2019-12-23 DIAGNOSIS — G4733 Obstructive sleep apnea (adult) (pediatric): Secondary | ICD-10-CM

## 2019-12-23 NOTE — Progress Notes (Signed)
RAEANN, MELANSON ([id number]) Visit Report for 12/23/2019 Arrival Information Details Patient Name: [name], [name]. Date of Service: 12/23/2019 2:00 PM Medical Record Number: [id number] Patient Account Number: [id number] Date of Birth/Sex: Apr 11, 1956 (64 y.o. F) Treating RN: [name] Primary Care [name] Other Clinician: Referring Ramello Cordial: [name] Treating [name]/Extender: [name], [name] [name]: 16 Visit Information History Since Last Visit Added or deleted any medications: No Patient Arrived: Ambulatory Any new allergies or adverse reactions: No Arrival Time: 14:13 Had a fall or experienced change in No Accompanied By: husband activities of daily living that may affect Transfer Assistance: None risk of falls: Patient Identification Verified: Yes Signs or symptoms of abuse/neglect since last visito No Patient Has Alerts: Yes Hospitalized since last visit: No Patient Alerts: Patient on Blood Thinner Implantable device outside of the clinic excluding No warfarin cellular tissue based products placed in the center Not Diabetic since last visit: 04/29/2019 [facility] Has Dressing in Place as Prescribed: Yes ABI R)1.17 L)1.24 TBI R) 1.12 L) 0.96 Has Compression in Place as Prescribed: No Pain Present Now: No Electronic Signature(s) Signed: 12/23/2019 3:47:49 PM By: [name] Entered By: Martha Bears on 12/23/2019 14:15:12 [name]. ([id number]) -------------------------------------------------------------------------------- Compression Therapy Details Patient Name: Martha Downs L. Date of Service: 12/23/2019 2:00 PM Medical Record Number: [id number] Patient Account Number: [id number] Date of Birth/Sex: [birth year] (64 y.o. F) Treating RN: [name] Primary Care Boston Cookson: [name] Other Clinician: Referring Tiann Saha: [name] Treating Kawana Hegel/Extender: [name],  [name] [name]: 16 Compression Therapy Performed for Wound Assessment: Wound #1 Left,Medial,Anterior Lower Leg Performed By: Clinician Martha Melia, RN Compression Type: Four Layer Post Procedure Diagnosis Same as Pre-procedure Electronic Signature(s) Signed: 12/23/2019 3:28:23 PM By: [name] Entered By: [name] on 12/23/2019 15:19:01 [name], [name] ([id number]) -------------------------------------------------------------------------------- Compression Therapy Details Patient Name: Martha Downs L. Date of Service: 12/23/2019 2:00 PM Medical Record Number: [id number] Patient Account Number: [id number] Date of Birth/Sex: May 13, 1956 (64 y.o. F) Treating RN: [name] Primary Care [name] Other Clinician: Referring Humphrey Guerreiro: [name] Treating Haig Gerardo/Extender: [name], [name] [name]: 16 Compression Therapy Performed for Wound Assessment: Wound #5 Left,Proximal Lower Leg Performed By: Clinician Martha Melia, RN Compression Type: Four Layer Post Procedure Diagnosis Same as Pre-procedure Electronic Signature(s) Signed: 12/23/2019 3:28:23 PM By: [name] Entered By: [name] on 12/23/2019 15:19:01 [name], [name] ([id number]) -------------------------------------------------------------------------------- Encounter Discharge Information Details Patient Name: Martha Downs L. Date of Service: 12/23/2019 2:00 PM Medical Record Number: [id number] Patient Account Number: [id number] Date of Birth/Sex: 1956/05/21 (64 y.o. F) Treating RN: [name] Primary Care [name] Other Clinician: Referring Sabastion Hrdlicka: [name] Treating [name]/Extender: [name], [name] [name]: 16 Encounter Discharge Information Items Discharge Condition: Stable Ambulatory Status: Ambulatory Discharge Destination: Home Transportation: Private Auto Accompanied By: husband Schedule Follow-up Appointment:  Yes Clinical Summary of Care: Electronic Signature(s) Signed: 12/23/2019 3:28:23 PM By: [name] Entered By: [name] on 12/23/2019 15:21:19 [name], [name] -------------------------------------------------------------------------------- Lower Extremity Assessment Details Patient Name: Martha Downs L. Date of Service: 12/23/2019 2:00 PM Medical Record Number: [id number] Patient Account Number: [id number] Date of Birth/Sex: 02-14-1956 (64 y.o. F) Treating RN: [name] Primary Care Jakaiya Netherland: [name] Other Clinician: Referring [name] Treating [name]/Extender: [name] [name]: 16 Edema Assessment Assessed: [Left: No] [Right: No] Edema: [Left: Yes] [Right: Yes] Calf Left: Right: Point  of Measurement: 32 cm From Medial Instep 60 cm 53 cm Ankle Left: Right: Point of Measurement: 10 cm From Medial Instep 30 cm 33 cm Vascular Assessment Pulses: Dorsalis Pedis Palpable: [Left:Yes] [Right:Yes] Electronic Signature(s) Signed: 12/23/2019 3:40:49 PM By: [name] Entered By: [name] on 12/23/2019 14:27:34 [name], [name] ([id number]) -------------------------------------------------------------------------------- Multi Wound Chart Details Patient Name: Martha Downs L. Date of Service: 12/23/2019 2:00 PM Medical Record Number: [id number] Patient Account Number: [id number] Date of Birth/Sex: [birth year] (64 y.o. F) Treating RN: [name] Primary Care Arianna Delsanto: [name] Other Clinician: Referring Kishan Wachsmuth: [name] Treating Malachai Schalk/Extender: [name], [name] [name]: 16 Vital Signs Height(in): 62 Pulse(bpm): 89 Weight(lbs): 290 Blood Pressure(mmHg): 144/56 Body Mass Index(BMI): 53 Temperature(F): 98.7 Respiratory Rate(breaths/min): 20 Photos: [N/A:N/A] Wound Location: Left, Medial, Anterior Lower Leg Left, Proximal Lower Leg N/A Wounding Event: Gradually Appeared  Gradually Appeared N/A Primary Etiology: Lymphedema Lymphedema N/A Comorbid History: Anemia, Coronary Artery Disease, Anemia, Coronary Artery Disease, N/A Hypertension Hypertension Date Acquired: 08/04/2019 10/21/2019 N/A Martha of [name]: 16 7 N/A Wound Status: Open Open N/A Measurements L x W x D (cm) 3x2.2x0.1 0.4x0.6x0.1 N/A Area (cm) : 5.184 0.188 N/A Volume (cm) : 0.518 0.019 N/A % Reduction in Area: 99.50% 20.30% N/A % Reduction in Volume: 99.50% 20.80% N/A Classification: Full Thickness Without Exposed Full Thickness Without Exposed N/A Support Structures Support Structures Exudate Amount: Medium Medium N/A Exudate Type: Serosanguineous Serosanguineous N/A Exudate Color: red, brown red, brown N/A Wound Margin: Flat and Intact N/A N/A Granulation Amount: Large (67-100%) Large (67-100%) N/A Granulation Quality: Red Red N/A Necrotic Amount: Small (1-33%) Small (1-33%) N/A Exposed Structures: Fat Layer (Subcutaneous Tissue) Fat Layer (Subcutaneous Tissue) N/A Exposed: Yes Exposed: Yes Fascia: No Fascia: No Tendon: No Tendon: No Muscle: No Muscle: No Joint: No Joint: No Bone: No Bone: No Epithelialization: Small (1-33%) None N/A [name] Notes Electronic Signature(s) Signed: 12/23/2019 3:28:23 PM By: [name] Entered By: [name] on 12/23/2019 15:17:14 Maestas, [name] ([id number]) [place], [name] ([id number]) -------------------------------------------------------------------------------- [place] Details Patient Name: [place], [name]. Date of Service: 12/23/2019 2:00 PM Medical Record Number: [id number] Patient Account Number: [id number] Date of Birth/Sex: 01-Aug-1956 (64 y.o. F) Treating RN: [name] Primary Care Donevan Biller: [name] Other Clinician: Referring Chesley Valls: [name] Treating Bennette Hasty/Extender: [name], [name] [name]: 16 Active Inactive Abuse / Safety / Falls / Self Care Management Nursing  Diagnoses: Potential for falls Goals: Patient will not experience any injury related to falls Date Initiated: 08/27/2019 Target Resolution Date: 11/20/2019 Goal Status: Active Interventions: Assess fall risk on admission and as needed Notes: Orientation to the Wound Care Program Nursing Diagnoses: Knowledge deficit related to the wound healing center program Goals: Patient/caregiver will verbalize understanding of the [facility] Date Initiated: 08/27/2019 Target Resolution Date: 11/20/2019 Goal Status: Active Interventions: Provide education on orientation to the wound center Notes: Venous Leg Ulcer Nursing Diagnoses: Potential for venous Insuffiency (use before diagnosis confirmed) Goals: Patient will maintain optimal edema control Date Initiated: 08/27/2019 Target Resolution Date: 11/20/2019 Goal Status: Active Interventions: Assess peripheral edema status every visit. Notes: Wound/Skin Impairment Nursing Diagnoses: Impaired tissue integrity Mccants, Graham ([id number]) Goals: Ulcer/skin breakdown will heal within 14 Martha Date Initiated: 08/27/2019 Target Resolution Date: 11/20/2019 Goal Status: Active Interventions: Assess patient/caregiver ability to obtain necessary supplies Assess patient/caregiver ability to perform ulcer/skin care regimen upon admission and as needed Assess ulceration(s) every visit Notes: Electronic Signature(s) Signed: 12/23/2019 3:28:23 PM By: [name]  Entered By: [name] on 12/23/2019 15:16:38 [place]gan, [name] ([id number]) -------------------------------------------------------------------------------- Pain Assessment Details Patient Name: [name], [name]. Date of Service: 12/23/2019 2:00 PM Medical Record Number: [id number] Patient Account Number: [id number] Date of Birth/Sex: 1955/09/15 (64 y.o. F) Treating RN: [name] Primary Care Paloma Grange: [name] Other Clinician: Referring Kendricks Reap: [name] Treating Demetre Monaco/Extender: [name], [name] [name]: 16 Active Problems Location of Pain Severity and Description of Pain Patient Has Paino No Site Locations Pain Management and Medication Current Pain Management: Electronic Signature(s) Signed: 12/23/2019 3:40:49 PM By: [name] Entered By: [name] on 12/23/2019 14:25:11 Nesler, [name] ([id number]) -------------------------------------------------------------------------------- Patient/Caregiver Education Details Patient Name: [name]. Date of Service: 12/23/2019 2:00 PM Medical Record Number: [id number] Patient Account Number: [id number] Date of Birth/Gender: 04-22-1956 (64 y.o. F) Treating RN: [name] Primary Care Physician: [name] Other Clinician: Referring Physician: Neta [name] Treating Physician/Extender: Sharalyn Ink in [name]: 16 Education Assessment Education Provided To: Patient Education Topics Provided Wound/Skin Impairment: Handouts: Caring for Your Ulcer Methods: Demonstration, Explain/Verbal Responses: State content correctly Electronic Signature(s) Signed: 12/23/2019 3:28:23 PM By: [name] Entered By: [name] on 12/23/2019 15:20:37 [employer], [name] ([id number]) -------------------------------------------------------------------------------- Wound Assessment Details Patient Name: Martha Downs L. Date of Service: 12/23/2019 2:00 PM Medical Record Number: [id number] Patient Account Number: [id number] Date of Birth/Sex: Nov 10, 1955 (64 y.o. F) Treating RN: [name] Primary Care Tipton Ballow: [name] Other Clinician: Referring Jedi Catalfamo: [name] Treating Raigan Baria/Extender: [name], [name] [name]: 16 Wound Status Wound Number: 1 Primary Etiology: Lymphedema Wound Location: Left, Medial, Anterior Lower Leg Wound Status: Open Wounding Event: Gradually Appeared Comorbid History: Anemia, Coronary Artery  Disease, Hypertension Date Acquired: 08/04/2019 Martha Of [name]: 16 Clustered Wound: No Photos Wound Measurements Length: (cm) 3 Width: (cm) 2.2 Depth: (cm) 0.1 Area: (cm) 5.184 Volume: (cm) 0.518 % Reduction in Area: 99.5% % Reduction in Volume: 99.5% Epithelialization: Small (1-33%) Tunneling: No Undermining: No Wound Description Classification: Full Thickness Without Exposed Support Structu Wound Margin: Flat and Intact Exudate Amount: Medium Exudate Type: Serosanguineous Exudate Color: red, brown res Foul Odor After Cleansing: No Slough/Fibrino Yes Wound Bed Granulation Amount: Large (67-100%) Exposed Structure Granulation Quality: Red Fascia Exposed: No Necrotic Amount: Small (1-33%) Fat Layer (Subcutaneous Tissue) Exposed: Yes Necrotic Quality: Adherent Slough Tendon Exposed: No Muscle Exposed: No Joint Exposed: No Bone Exposed: No [name] Notes Wound #1 (Left, Medial, Anterior Lower Leg) Notes TCA ABD 4 layer bilateral Electronic Signature(s) Signed: 12/23/2019 3:40:49 PM By: Jearld Shines, [name] ([id number]) Entered By: [name] on 12/23/2019 14:34:32 Fujiwara, Dolton L. ([id number]) -------------------------------------------------------------------------------- Wound Assessment Details Patient Name: Martha Downs L. Date of Service: 12/23/2019 2:00 PM Medical Record Number: [id number] Patient Account Number: [id number] Date of Birth/Sex: 22-Mar-1956 (64 y.o. F) Treating RN: [name] Primary Care Aaryav Hopfensperger: [name] Other Clinician: Referring Kymberlyn Eckford: [name] Treating Myha Arizpe/Extender: [name], [name] [name]: 16 Wound Status Wound Number: 5 Primary Etiology: Lymphedema Wound Location: Left, Proximal Lower Leg Wound Status: Open Wounding Event: Gradually Appeared Comorbid History: Anemia, Coronary Artery Disease, Hypertension Date Acquired: 10/21/2019 Martha Of [name]: 7 Clustered Wound:  No Photos Wound Measurements Length: (cm) 0.4 Width: (cm) 0.6 Depth: (cm) 0.1 Area: (cm) 0.188 Volume: (cm) 0.019 % Reduction in Area: 20.3% % Reduction in Volume: 20.8% Epithelialization: None Tunneling: No Undermining: No Wound Description Classification: Full Thickness Without Exposed Support Structu Exudate Amount: Medium Exudate Type: Serosanguineous Exudate Color: red, brown  res Foul Odor After Cleansing: No Slough/Fibrino Yes Wound Bed Granulation Amount: Large (67-100%) Exposed Structure Granulation Quality: Red Fascia Exposed: No Necrotic Amount: Small (1-33%) Fat Layer (Subcutaneous Tissue) Exposed: Yes Necrotic Quality: Adherent Slough Tendon Exposed: No Muscle Exposed: No Joint Exposed: No Bone Exposed: No [name] Notes Wound #5 (Left, Proximal Lower Leg) Notes TCA ABD 4 layer bilateral Electronic Signature(s) Signed: 12/23/2019 3:40:49 PM By: Jearld Shines, [name] ([id number]) Entered By: [name] on 12/23/2019 14:34:55 Essner, [name] ([id number]) -------------------------------------------------------------------------------- Vitals Details Patient Name: Martha Downs L. Date of Service: 12/23/2019 2:00 PM Medical Record Number: [id number] Patient Account Number: [id number] Date of Birth/Sex: 05/07/1956 (64 y.o. F) Treating RN: [name] Primary Care Nicklas Mcsweeney: [name] Other Clinician: Referring Yilin Weedon: [name] Treating Mohd. Derflinger/Extender: [name], [name] [name]: 16 Vital Signs Time Taken: 14:10 Temperature (F): 98.7 Height (in): 62 Pulse (bpm): 89 Weight (lbs): 290 Respiratory Rate (breaths/min): 20 Body Mass Index (BMI): 53 Blood Pressure (mmHg): 144/56 Reference Range: 80 - 120 mg / dl Electronic Signature(s) Signed: 12/23/2019 3:47:49 PM By: [name] Entered By: Martha Bears on 12/23/2019 14:16:51

## 2019-12-23 NOTE — Patient Instructions (Signed)
Medication Instructions:  Your physician recommends that you continue on your current medications as directed. Please refer to the Current Medication list given to you today.  *If you need a refill on your cardiac medications before your next appointment, please call your pharmacy*   Lab Work: None If you have labs (blood work) drawn today and your tests are completely normal, you will receive your results only by: Marland Kitchen MyChart Message (if you have MyChart) OR . A paper copy in the mail If you have any lab test that is abnormal or we need to change your treatment, we will call you to review the results.   Testing/Procedures: None   Follow-Up: At [facility], you and your health needs are our priority.  As part of our continuing mission to provide you with exceptional heart care, we have created designated Provider Care Teams.  These Care Teams include your primary Cardiologist (physician) and Advanced Practice Providers (APPs -  Physician Assistants and Nurse Practitioners) who all work together to provide you with the care you need, when you need it.  We recommend signing up for the patient portal called "MyChart".  Sign up information is provided on this After Visit Summary.  MyChart is used to connect with patients for Virtual Visits (Telemedicine).  Patients are able to view lab/test results, encounter notes, upcoming appointments, etc.  Non-urgent messages can be sent to your provider as well.   To learn more about what you can do with MyChart, go to [email or web address].    Your next appointment:   3 month(s)  The format for your next appointment:   In Person  Provider:   Virtual with Dr. Bettina Gavia   Other Instructions

## 2019-12-23 NOTE — Progress Notes (Addendum)
TORUNN, PARGA ([id number]) Visit Report for 12/23/2019 Chief Complaint Document Details Patient Name: [name], [name]. Date of Service: 12/23/2019 2:00 PM Medical Record Number: [id number] Patient Account Number: [id number] Date of Birth/Sex: [birth year] (64 y.o. F) Treating RN: Army Melia Primary Care Provider: Neta Ehlers Other Clinician: Referring Provider: Neta Ehlers Treating Provider/Extender: Melburn Hake, Ferlando Lia Weeks in Treatment: 16 Information Obtained from: Patient Chief Complaint Bilateral LE Lymphedema and ulcers Electronic Signature(s) Signed: 12/23/2019 3:14:58 PM By: Worthy Keeler PA-C Entered By: Worthy Keeler on 12/23/2019 15:14:57 [place], [name] ([id number]) -------------------------------------------------------------------------------- HPI Details Patient Name: Martha Downs L. Date of Service: 12/23/2019 2:00 PM Medical Record Number: [id number] Patient Account Number: [id number] Date of Birth/Sex: 07-18-1956 (64 y.o. F) Treating RN: Army Melia Primary Care Provider: Neta Ehlers Other Clinician: Referring Provider: Neta Ehlers Treating Provider/Extender: Melburn Hake, Chau Savell Weeks in Treatment: 16 History of Present Illness HPI Description: 08/27/2019 patient presents today for initial evaluation here in our clinic concerning issues that she has been having with her bilateral lower extremities with lymphedema and weeping. She is previously been a patient over the past year of Dr. Jerilee Hoh at the wound care center in [facility]. With that being said she subsequently was recommended by the lymphedema clinic here in [place] to follow-up with [place] here instead of going back to [place] since they were in closer contact with the clinic here than they are obviously. With that being said the patient states that is why she came her way in order to be evaluated at this point. Fortunately there is no signs of active infection at this time. No  fevers, chills, nausea, vomiting, or diarrhea. The patient does have a history of lymphedema, hypertension, long-term use of anticoagulant therapy, and obstructive sleep apnea. During the visit today and from checking into the discharge she was prone to unexpectedly falling asleep. I did discuss this with her daughter as well who states that this is very common for her she states she is not really sure how compliant her mother is with her CPAP machine that may be part of the issue. The patient did have ABIs performed which revealed have copies of his well today that was on 05/01/2019 and it showed a right ABI of 1.17 with a TBI of 1.12 and a left ABI of 1.24 with a TBI of 0.96. She has had 2 transfusions in the past 2 weeks apparently as well. 08/31/2019 upon evaluation today patient actually appears to be doing somewhat better in regard to her lower extremity ulcers. She still has a tremendous amount of drainage from the left lower extremity unfortunately but her right lower extremity is doing much better which is great news. Overall I am very pleased in this regard. With regard to home health we finally got things settled so home health will be coming out to see her this would be very beneficial in my opinion as I think they will be able to help her tremendously with more frequent dressing changes. 12/28-Patient is back here at 1 week visit, with regard to her bilateral lower extremity ulcers, she has a significant degree of lymphedema, significant degree of drainage more from the left and which continues to be an issue, patient had not been using her lymphedema pumps as recommended, she is concerned about amount of drainage and how quickly it soaks the dressing, she is concerned about the lymphedema pump sticking to her skin and was told to use it on the wraps.  She will get in touch with her cardiologist about adjusting her Lasix dose. 09/14/2019 on evaluation today patient appears to be doing poorly  in regard to her lower extremities especially left lower extremity. Unfortunately she is continue to have a significant amount of drainage which is preventing her from being able to heal keeping the wounds very macerated. She also has some warmth to touch in the anterior to medial portion of the left lower extremity compared to the remainder of the wound which does have made somewhat concerned about the possibility of cellulitis. Obviously it can be difficult to determine just strictly lymphedema versus cellulitis but right now I am somewhat concerned. She tells me that she can put a wrap on and at least has to change this twice a day or else she will be walking around leaving puddles of water behind her. Obviously this is fairly significant. She did see her primary care provider they did not see anything going on that they felt needed to be addressed from a diuretic standpoint but nonetheless I am still somewhat concerned based on what we are seeing to be honest. The patient and her husband are likewise concerned. 09/17/2019 patient is seen today in follow-up I last saw her on Tuesday at that point based on the severity of her lower extremity edema, weeping, and erythema I did send her to the ER for further evaluation and treatment. She subsequently did go for that evaluation. It did appear that based on my review of the notes this is dated 09/14/2019 that the patient did have a BNP of 149.4. Which was notable. Subsequently she was given a dose of IV vancomycin as well as doxycycline and Keflex. It appears however that she was discharged on oral clindamycin 150 mg every 6 hours and to be honest compared to what I saw on Tuesday she actually seems to be doing somewhat better. Fortunately there is no evidence of active infection at this time which is good news. In the interim she did yesterday see her cardiologist as well Dr. Bettina Gavia. That was on 09/16/2019. Upon review of this note it does appear that his  recommendation at this point was to increase the furosemide to 40 mg 2 times a day. He also recommend an increase of potassium 10 mg taken 2 times a day. His other recommendation was that the patient needs to have a follow-up with her hematology oncologist in order to see if they can do anything about iron transfusions for her as unfortunately her iron is still quite low and obviously oral supplementation is not going to be the best way to get this up. 09/24/2019 upon evaluation today patient appears to be doing quite well with regard to her lower extremities. The right lower extremity in particular has dramatically improved to be honest so has the left in my opinion. There does not appear to be any signs of active infection which is good news. No fevers, chills, nausea, vomiting, or diarrhea. Patient is still taking the clindamycin at this time. 10/01/2019 on evaluation today patient appears to be showing some signs of improvement at this point in regard to her lower extremities. The right lower extremity is doing quite well the left lower extremity is still draining but again I do believe this is significantly better compared to what it was in the past. Fortunately there is no evidence of active infection at this time. No fever chills noted 10/08/2019 upon evaluation today patient appears to have smaller areas of erythema and  weeping on the left lower extremity as compared to last week's evaluation. The right leg is doing excellent. With that being said unfortunately she is still having a lot of significant swelling on the left in fact the swelling is worse today than it was last week. With that being said she is telling me that she is able to keep her wraps on now for at least the day sometimes even up to 30 hours before they are having to be changed. When they do have to change it they are using her lymphedema wraps at home to reapply what they can. She only changes that she tells me when it actually  gets to the point that is draining so much that it leaks onto her floor. 10/15/2019 upon evaluation today patient appears to actually be doing very well with regard to her lower extremities in fact I believe the right lower extremity may be completely sealed up that we will likely watch it 1 more week. The left lower extremity is doing better she does not seem to be having as much weeping and leaking but still this is going on. Home health still has not gotten the [place] there is still just using ABD pads I believe was able [place], [name] to get Lauraine Rinne this would actually help out. 10/21/2019 upon evaluation today patient appears to be doing much better in regard to her lower extremities bilaterally. The right lower extremity has 1 area remaining that still draining the left lower extremity is still draining but not nearly what it was in the past. Overall I am extremely pleased with how things seem to be progressing. 10/29/2019 upon evaluation today patient actually appears to be doing quite well in regard to her lower extremities bilaterally both extremities show signs of improvement. The biggest issue we seem to be having is that unfortunately the home health nurse does not seem to be wrapping this quite right in that it seems to slide down within just a few hours of them coming out to rewrap her according to the patient and her husband. The wraps we put on the state last much longer in fact generally until they are taken off. Fortunately there is no signs of infection although unfortunately she does still have a few open areas on both lower extremities at this time. 11/12/2019 upon evaluation today patient appears to be doing excellent in regard to her wounds. She does not have weeping like she is had in the past and overall I feel like she is doing quite well. With that being said this is something that has continued to give her trouble and that she states that the wrap is sliding  down especially when home health puts this on. She tells me that the ones we put on do not seem to be sliding down at this point. Nonetheless I think that this is definitely something of concern. Obviously if it slides down can cause trouble with getting the area to effectively heal and continue to do well. 11/19/2019 upon evaluation today patient actually appears to be doing very well with regard to her bilateral lower extremities. She just has a couple small areas 1 on the right 2 on the left that are still open at this point and all seem to be headed in the right direction. She is no longer saturating her wraps which is also good news. Overall I am extremely pleased with the progress that is been made up to this point. I do believe we  may be able to switch into her lymphedema wraps at this point. 12/09/2019 unfortunately the patient appears to be doing more poorly this week in regard to her legs. We actually transitioned her to her lymphedema wraps last week although I do not believe they are controlling her edema well enough. She has areas that have reopened on the right and just a very small weeping region in fact there is not even really a wound here. On the left she has wounds that have reopened and appear to be doing worse she is weeping quite significantly. I think Lauraine Rinne is good to be the ideal thing here I do not even think we can use the alginate as I am afraid that we just become too moist at this point. Fortunately there is no signs of active infection at this time. She is on doxycycline for completely separate issue dealing with a uterine fibroid. 12/17/2019 upon evaluation today patient actually appears to be doing excellent in regard to her wounds currently of the left lower extremity. These are all measuring better and looking well. The right lower extremity is completely sealed up and doing excellent. Overall very pleased with how things seem to be progressing. Her swelling is also  down from last week. 12/23/2019 upon evaluation today patient's legs actually appear to be very dry compared to normal this is actually a good thing for her as unfortunately when she starts to swell too much she tends to weep more which leads to greater issues. Fortunately there is no signs of active infection at this time which is excellent. Overall I am very pleased with how things seem to be progressing. The only real issue we had is that unfortunately her wrap started to slide down apparently we did use the [place] paste to anchor last time I think that something we can definitely do. Electronic Signature(s) Signed: 12/23/2019 4:55:13 PM By: Worthy Keeler PA-C Entered By: Worthy Keeler on 12/23/2019 16:55:13 [name], [name] ([id number]) -------------------------------------------------------------------------------- Physical Exam Details Patient Name: [name], [name] Date of Service: 12/23/2019 2:00 PM Medical Record Number: [id number] Patient Account Number: [id number] Date of Birth/Sex: [birth year] (64 y.o. F) Treating RN: Army Melia Primary Care Provider: Neta Ehlers Other Clinician: Referring Provider: Neta Ehlers Treating Provider/Extender: Melburn Hake, Merdith Boyd Weeks in Treatment: 16 Constitutional Obese and well-hydrated in no acute distress. Respiratory normal breathing without difficulty. Psychiatric this patient is able to make decisions and demonstrates good insight into disease process. Alert and Oriented x 3. pleasant and cooperative. Notes Inspection patient's wounds appear to be fairly dry and overall feeling confident that she is doing quite well at this point. I did not perform any sharp debridement overall I think that the best thing is to try to keep things nice and dry and hopefully this will clear up and be able to transition her back into her lymphedema wraps we will keep her appropriately compressed. I may want to transfer her back to lymphedema clinic in  order to have them take back over care as well. Electronic Signature(s) Signed: 12/23/2019 4:55:50 PM By: Worthy Keeler PA-C Entered By: Worthy Keeler on 12/23/2019 16:55:50 [name], [name] ([id number]) -------------------------------------------------------------------------------- Physician Orders Details Patient Name: Martha Siren. Date of Service: 12/23/2019 2:00 PM Medical Record Number: [id number] Patient Account Number: [id number] Date of Birth/Sex: Feb 24, 1956 (64 y.o. 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 Wound #5 Left,Proximal Lower Leg o Cleanse wound with mild soap and water o Dial antibacterial soap, wash wounds, rinse and pat dry prior to dressing wounds o May Shower, gently pat wound dry prior to applying new dressing. Skin Barriers/Peri-Wound Care Wound #1 Left,Medial,Anterior Lower Leg o Triamcinolone Acetonide Ointment (TCA) - to either leg on places that itch - ask patient Secondary Dressing Wound #1 Left,Medial,Anterior Lower Leg o ABD pad Wound #5 Left,Proximal Lower Leg o ABD pad Dressing Change Frequency Wound #1 Left,Medial,Anterior Lower Leg o Change dressing  every week - Nurse visit as needed Wound #5 Left,Proximal Lower Leg o Change dressing every week - Nurse visit as needed Follow-up Appointments Wound #1 Left,Medial,Anterior Lower Leg o Return Appointment in 1 week. Wound #5 Left,Proximal Lower Leg o Return Appointment in 1 week. Edema Control Wound #1 Left,Medial,Anterior Lower Leg o 4 Layer Compression System - Bilateral o Elevate legs to the level of the heart and pump ankles as often as possible o Compression Pump: Use compression pump on left lower extremity for 60 minutes, twice daily. o Compression Pump: Use compression pump on right lower extremity for 60 minutes, twice daily. [place], [place] ([id number]) Wound #5 Left,Proximal Lower Leg o 4 Layer Compression System - Bilateral o Elevate legs to the level of the heart and pump ankles as often as possible o Compression Pump: Use compression pump on left lower extremity for 60 minutes, twice daily. o Compression Pump: Use compression pump on right lower extremity for 60 minutes, twice daily. Electronic Signature(s) Signed: 12/23/2019 3:28:23 PM By: Army Melia Signed: 12/23/2019 5:42:16 PM By: Worthy Keeler PA-C Entered By: Army Melia on 12/23/2019 15:19:58 [name], [name] ([id number]) -------------------------------------------------------------------------------- Problem List Details Patient Name: [name], [name] Date of Service: 12/23/2019 2:00 PM Medical Record Number: [id number] Patient Account Number: [id number] Date of Birth/Sex: [birth year] (64 y.o. F) Treating RN: Army Melia Primary Care Provider: Neta Ehlers Other Clinician: Referring Provider: Neta Ehlers Treating Provider/Extender: Melburn Hake, Ferrell Flam Weeks in Treatment: 16 Active Problems ICD-10 Evaluated Encounter Code Description Active Date Today Diagnosis I89.0 Lymphedema, not elsewhere classified 08/27/2019 No Yes L97.812 Non-pressure chronic ulcer of other part of right  lower leg with fat layer 08/27/2019 No Yes exposed L97.822 Non-pressure chronic ulcer of other part of left lower leg with fat layer 08/27/2019 No Yes exposed [name] (primary) hypertension 08/27/2019 No Yes Z79.01 Long term (current) use of anticoagulants 08/27/2019 No Yes Inactive Problems Resolved Problems Electronic Signature(s) Signed: 12/23/2019 3:14:48 PM By: Worthy Keeler PA-C Entered By: Worthy Keeler on 12/23/2019 15:14:47 [name], [name] ([id number]) -------------------------------------------------------------------------------- Progress Note Details Patient Name: Martha Downs L. Date of Service: 12/23/2019 2:00 PM Medical Record Number: [id number] Patient Account Number: [id number] Date of Birth/Sex: 04-05-1956 (64 y.o. F) Treating RN: Army Melia Primary Care Provider: Neta Ehlers Other Clinician: Referring Provider: Neta Ehlers Treating Provider/Extender: Melburn Hake, Casin Federici Weeks in Treatment:  16 Subjective Chief Complaint Information obtained from Patient Bilateral LE Lymphedema and ulcers History of Present Illness (HPI) 08/27/2019 patient presents today for initial evaluation here in our clinic concerning issues that she has been having with her bilateral lower extremities with lymphedema and weeping. She is previously been a patient over the past year of Dr. Jerilee Hoh at the wound care center in [facility]. With that being said she subsequently was recommended by the lymphedema clinic here in [place] to follow-up with [place] here instead of going back to [place] since they were in closer contact with the clinic here than they are obviously. With that being said the patient states that is why she came her way in order to be evaluated at this point. Fortunately there is no signs of active infection at this time. No fevers, chills, nausea, vomiting, or diarrhea. The patient does have a history of lymphedema, hypertension, long-term use of  anticoagulant therapy, and obstructive sleep apnea. During the visit today and from checking into the discharge she was prone to unexpectedly falling asleep. I did discuss this with her daughter as well who states that this is very common for her she states she is not really sure how compliant her mother is with her CPAP machine that may be part of the issue. The patient did have ABIs performed which revealed have copies of his well today that was on 05/01/2019 and it showed a right ABI of 1.17 with a TBI of 1.12 and a left ABI of 1.24 with a TBI of 0.96. She has had 2 transfusions in the past 2 weeks apparently as well. 08/31/2019 upon evaluation today patient actually appears to be doing somewhat better in regard to her lower extremity ulcers. She still has a tremendous amount of drainage from the left lower extremity unfortunately but her right lower extremity is doing much better which is great news. Overall I am very pleased in this regard. With regard to home health we finally got things settled so home health will be coming out to see her this would be very beneficial in my opinion as I think they will be able to help her tremendously with more frequent dressing changes. 12/28-Patient is back here at 1 week visit, with regard to her bilateral lower extremity ulcers, she has a significant degree of lymphedema, significant degree of drainage more from the left and which continues to be an issue, patient had not been using her lymphedema pumps as recommended, she is concerned about amount of drainage and how quickly it soaks the dressing, she is concerned about the lymphedema pump sticking to her skin and was told to use it on the wraps. She will get in touch with her cardiologist about adjusting her Lasix dose. 09/14/2019 on evaluation today patient appears to be doing poorly in regard to her lower extremities especially left lower extremity. Unfortunately she is continue to have a significant amount  of drainage which is preventing her from being able to heal keeping the wounds very macerated. She also has some warmth to touch in the anterior to medial portion of the left lower extremity compared to the remainder of the wound which does have made somewhat concerned about the possibility of cellulitis. Obviously it can be difficult to determine just strictly lymphedema versus cellulitis but right now I am somewhat concerned. She tells me that she can put a wrap on and at least has to change this twice a day or else she will be walking around  leaving puddles of water behind her. Obviously this is fairly significant. She did see her primary care provider they did not see anything going on that they felt needed to be addressed from a diuretic standpoint but nonetheless I am still somewhat concerned based on what we are seeing to be honest. The patient and her husband are likewise concerned. 09/17/2019 patient is seen today in follow-up I last saw her on Tuesday at that point based on the severity of her lower extremity edema, weeping, and erythema I did send her to the ER for further evaluation and treatment. She subsequently did go for that evaluation. It did appear that based on my review of the notes this is dated 09/14/2019 that the patient did have a BNP of 149.4. Which was notable. Subsequently she was given a dose of IV vancomycin as well as doxycycline and Keflex. It appears however that she was discharged on oral clindamycin 150 mg every 6 hours and to be honest compared to what I saw on Tuesday she actually seems to be doing somewhat better. Fortunately there is no evidence of active infection at this time which is good news. In the interim she did yesterday see her cardiologist as well Dr. Bettina Gavia. That was on 09/16/2019. Upon review of this note it does appear that his recommendation at this point was to increase the furosemide to 40 mg 2 times a day. He also recommend an increase of potassium 10  mg taken 2 times a day. His other recommendation was that the patient needs to have a follow-up with her hematology oncologist in order to see if they can do anything about iron transfusions for her as unfortunately her iron is still quite low and obviously oral supplementation is not going to be the best way to get this up. 09/24/2019 upon evaluation today patient appears to be doing quite well with regard to her lower extremities. The right lower extremity in particular has dramatically improved to be honest so has the left in my opinion. There does not appear to be any signs of active infection which is good news. No fevers, chills, nausea, vomiting, or diarrhea. Patient is still taking the clindamycin at this time. 10/01/2019 on evaluation today patient appears to be showing some signs of improvement at this point in regard to her lower extremities. The right lower extremity is doing quite well the left lower extremity is still draining but again I do believe this is significantly better compared to what it was in the past. Fortunately there is no evidence of active infection at this time. No fever chills noted 10/08/2019 upon evaluation today patient appears to have smaller areas of erythema and weeping on the left lower extremity as compared to last week's evaluation. The right leg is doing excellent. With that being said unfortunately she is still having a lot of significant swelling on the left in fact the swelling is worse today than it was last week. With that being said she is telling me that she is able to keep her wraps on now for at least the day sometimes even up to 30 hours before they are having to be changed. When they do have to change it they are using her lymphedema wraps at home [place], [place]. ([id number]) to reapply what they can. She only changes that she tells me when it actually gets to the point that is draining so much that it leaks onto her floor. 10/15/2019 upon evaluation  today patient appears to actually  be doing very well with regard to her lower extremities in fact I believe the right lower extremity may be completely sealed up that we will likely watch it 1 more week. The left lower extremity is doing better she does not seem to be having as much weeping and leaking but still this is going on. Home health still has not gotten the [place] there is still just using ABD pads I believe was able to get Lauraine Rinne this would actually help out. 10/21/2019 upon evaluation today patient appears to be doing much better in regard to her lower extremities bilaterally. The right lower extremity has 1 area remaining that still draining the left lower extremity is still draining but not nearly what it was in the past. Overall I am extremely pleased with how things seem to be progressing. 10/29/2019 upon evaluation today patient actually appears to be doing quite well in regard to her lower extremities bilaterally both extremities show signs of improvement. The biggest issue we seem to be having is that unfortunately the home health nurse does not seem to be wrapping this quite right in that it seems to slide down within just a few hours of them coming out to rewrap her according to the patient and her husband. The wraps we put on the state last much longer in fact generally until they are taken off. Fortunately there is no signs of infection although unfortunately she does still have a few open areas on both lower extremities at this time. 11/12/2019 upon evaluation today patient appears to be doing excellent in regard to her wounds. She does not have weeping like she is had in the past and overall I feel like she is doing quite well. With that being said this is something that has continued to give her trouble and that she states that the wrap is sliding down especially when home health puts this on. She tells me that the ones we put on do not seem to be sliding down at this  point. Nonetheless I think that this is definitely something of concern. Obviously if it slides down can cause trouble with getting the area to effectively heal and continue to do well. 11/19/2019 upon evaluation today patient actually appears to be doing very well with regard to her bilateral lower extremities. She just has a couple small areas 1 on the right 2 on the left that are still open at this point and all seem to be headed in the right direction. She is no longer saturating her wraps which is also good news. Overall I am extremely pleased with the progress that is been made up to this point. I do believe we may be able to switch into her lymphedema wraps at this point. 12/09/2019 unfortunately the patient appears to be doing more poorly this week in regard to her legs. We actually transitioned her to her lymphedema wraps last week although I do not believe they are controlling her edema well enough. She has areas that have reopened on the right and just a very small weeping region in fact there is not even really a wound here. On the left she has wounds that have reopened and appear to be doing worse she is weeping quite significantly. I think Lauraine Rinne is good to be the ideal thing here I do not even think we can use the alginate as I am afraid that we just become too moist at this point. Fortunately there is no signs of active infection at this  time. She is on doxycycline for completely separate issue dealing with a uterine fibroid. 12/17/2019 upon evaluation today patient actually appears to be doing excellent in regard to her wounds currently of the left lower extremity. These are all measuring better and looking well. The right lower extremity is completely sealed up and doing excellent. Overall very pleased with how things seem to be progressing. Her swelling is also down from last week. 12/23/2019 upon evaluation today patient's legs actually appear to be very dry compared to normal this is  actually a good thing for her as unfortunately when she starts to swell too much she tends to weep more which leads to greater issues. Fortunately there is no signs of active infection at this time which is excellent. Overall I am very pleased with how things seem to be progressing. The only real issue we had is that unfortunately her wrap started to slide down apparently we did use the [place] paste to anchor last time I think that something we can definitely do. Objective Constitutional Obese and well-hydrated in no acute distress. Vitals Time Taken: 2:10 PM, Height: 62 in, Weight: 290 lbs, BMI: 53, Temperature: 98.7 F, Pulse: 89 bpm, Respiratory Rate: 20 breaths/min, Blood Pressure: 144/56 mmHg. Respiratory normal breathing without difficulty. Psychiatric this patient is able to make decisions and demonstrates good insight into disease process. Alert and Oriented x 3. pleasant and cooperative. General Notes: Inspection patient's wounds appear to be fairly dry and overall feeling confident that she is doing quite well at this point. I did not perform any sharp debridement overall I think that the best thing is to try to keep things nice and dry and hopefully this will clear up and be able to transition her back into her lymphedema wraps we will keep her appropriately compressed. I may want to transfer her back to lymphedema clinic in [place], [place]. ([id number]) order to have them take back over care as well. Integumentary (Hair, Skin) Wound #1 status is Open. Original cause of wound was Gradually Appeared. The wound is located on the Left,Medial,Anterior Lower Leg. The wound measures 3cm length x 2.2cm width x 0.1cm depth; 5.184cm^2 area and 0.518cm^3 volume. There is Fat Layer (Subcutaneous Tissue) Exposed exposed. There is no tunneling or undermining noted. There is a medium amount of serosanguineous drainage noted. The wound margin is flat and intact. There is large (67-100%) red  granulation within the wound bed. There is a small (1-33%) amount of necrotic tissue within the wound bed including Adherent Slough. Wound #5 status is Open. Original cause of wound was Gradually Appeared. The wound is located on the Left,Proximal Lower Leg. The wound measures 0.4cm length x 0.6cm width x 0.1cm depth; 0.188cm^2 area and 0.019cm^3 volume. There is Fat Layer (Subcutaneous Tissue) Exposed exposed. There is no tunneling or undermining noted. There is a medium amount of serosanguineous drainage noted. There is large (67-100%) red granulation within the wound bed. There is a small (1-33%) amount of necrotic tissue within the wound bed including Adherent Slough. Assessment Active Problems ICD-10 Lymphedema, not elsewhere classified Non-pressure chronic ulcer of other part of right lower leg with fat layer exposed Non-pressure chronic ulcer of other part of left lower leg with fat layer exposed Essential (primary) hypertension Long term (current) use of anticoagulants Procedures Wound #1 Pre-procedure diagnosis of Wound #1 is a Lymphedema located on the Left,Medial,Anterior Lower Leg . There was a Four Layer Compression Therapy Procedure by Army Melia, RN. Post procedure Diagnosis Wound #1: Same  as Pre-Procedure Wound #5 Pre-procedure diagnosis of Wound #5 is a Lymphedema located on the Left,Proximal Lower Leg . There was a Four Layer Compression Therapy Procedure by Army Melia, RN. Post procedure Diagnosis Wound #5: Same as Pre-Procedure Plan Wound Cleansing: Wound #1 Left,Medial,Anterior Lower Leg: Cleanse wound with mild soap and water Dial antibacterial soap, wash wounds, rinse and pat dry prior to dressing wounds May Shower, gently pat wound dry prior to applying new dressing. Wound #5 Left,Proximal Lower Leg: Cleanse wound with mild soap and water Dial antibacterial soap, wash wounds, rinse and pat dry prior to dressing wounds May Shower, gently pat wound dry prior  to applying new dressing. Skin Barriers/Peri-Wound Care: Wound #1 Left,Medial,Anterior Lower Leg: Triamcinolone Acetonide Ointment (TCA) - to either leg on places that itch - ask patient Secondary Dressing: Wound #1 Left,Medial,Anterior Lower Leg: ABD pad Wound #5 Left,Proximal Lower Leg: [name], [name] ([id number]) ABD pad Dressing Change Frequency: Wound #1 Left,Medial,Anterior Lower Leg: Change dressing every week - Nurse visit as needed Wound #5 Left,Proximal Lower Leg: Change dressing every week - Nurse visit as needed Follow-up Appointments: Wound #1 Left,Medial,Anterior Lower Leg: Return Appointment in 1 week. Wound #5 Left,Proximal Lower Leg: Return Appointment in 1 week. Edema Control: Wound #1 Left,Medial,Anterior Lower Leg: 4 Layer Compression System - Bilateral Elevate legs to the level of the heart and pump ankles as often as possible Compression Pump: Use compression pump on left lower extremity for 60 minutes, twice daily. Compression Pump: Use compression pump on right lower extremity for 60 minutes, twice daily. Wound #5 Left,Proximal Lower Leg: 4 Layer Compression System - Bilateral Elevate legs to the level of the heart and pump ankles as often as possible Compression Pump: Use compression pump on left lower extremity for 60 minutes, twice daily. Compression Pump: Use compression pump on right lower extremity for 60 minutes, twice daily. 1. My suggestion at this time is good to be that we go ahead and continue with the wound care measures as before with regard to the management of her wounds things are quite dry at this point I think we can use just an ABD pad over the areas where potentially she may have some weeping in order to absorb if needed but in general I feel like she is doing quite well. 2. I am also can recommend that we continue with the 4 layer compression wraps bilaterally. She is to try to keep these on for the week if they start to slide we will  make an appointment for her on Monday for a nurse visit in order to come back and have this changed out. 3. I do recommend the patient continue to elevate her legs as well. I also believe that we may need to transfer her back to the lymphedema clinic once everything seals up in order to have them help her with getting her edema under better control. Obviously as long as she had wounds we been having to see her but I think that that may be an appropriate way to go at this point. We will see how she is doing next week. We will see patient back for reevaluation in 1 week here in the clinic. If anything worsens or changes patient will contact our office for additional recommendations. Electronic Signature(s) Signed: 12/23/2019 4:57:35 PM By: Worthy Keeler PA-C Entered By: Worthy Keeler on 12/23/2019 16:57:35 [name], [name] ([id number]) -------------------------------------------------------------------------------- SuperBill Details Patient Name: Martha Siren. Date of Service: 12/23/2019 Medical Record Number: [id number]  Patient Account Number: [id number] Date of Birth/Sex: 08-26-1956 (65 y.o. F) Treating RN: Army Melia Primary Care Provider: Neta Ehlers Other Clinician: Referring Provider: Neta Ehlers Treating Provider/Extender: Melburn Hake, Everet Flagg Weeks in Treatment: 16 Diagnosis Coding ICD-10 Codes Code Description I89.0 Lymphedema, not elsewhere classified L97.812 Non-pressure chronic ulcer of other part of right lower leg with fat layer exposed L97.822 Non-pressure chronic ulcer of other part of left lower leg with fat layer exposed [place] (primary) hypertension Z79.01 Long term (current) use of anticoagulants Facility Procedures CPT4: Description Modifier Quantity Code [id number] BILATERAL: Application of multi-layer venous compression system; leg (below knee), including ankle 1 and foot. Physician Procedures CPT4 Code: [id number] Description: [id number] - WC PHYS  LEVEL 3 - EST PT Modifier: Quantity: 1 CPT4 Code: Description: ICD-10 Diagnosis Description I89.0 Lymphedema, not elsewhere classified [id number] Non-pressure chronic ulcer of other part of right lower leg with fat layer L97.822 Non-pressure chronic ulcer of other part of left lower leg with fat layer e Modifier: exposed xposed Quantity: Electronic Signature(s) Signed: 12/23/2019 4:57:53 PM By: Worthy Keeler PA-C Previous Signature: 12/23/2019 3:28:23 PM Version By: Army Melia Entered By: Worthy Keeler on 12/23/2019 16:57:52

## 2019-12-27 ENCOUNTER — Ambulatory Visit: Payer: Medicare Other

## 2019-12-30 ENCOUNTER — Ambulatory Visit: Payer: Medicare Other | Admitting: Internal Medicine

## 2020-01-06 ENCOUNTER — Encounter: Payer: Medicare Other | Admitting: Physician Assistant

## 2020-01-06 ENCOUNTER — Other Ambulatory Visit: Payer: Self-pay

## 2020-01-06 DIAGNOSIS — L97812 Non-pressure chronic ulcer of other part of right lower leg with fat layer exposed: Secondary | ICD-10-CM | POA: Diagnosis not present

## 2020-01-07 NOTE — Progress Notes (Addendum)
BREINDY, TRIMARCO ([id number]) Visit Report for 01/06/2020 Chief Complaint Document Details Patient Name: JORDYN, STYCZYNSKI. Date of Service: 01/06/2020 3:15 PM Medical Record Number: [id number] Patient Account Number: [id number] Date of Birth/Sex: 1955/12/01 (64 y.o. F) Treating RN: Army Melia Primary Care Provider: Neta Ehlers Other Clinician: Referring Provider: Neta Ehlers Treating Provider/Extender: Melburn Hake, Tarrin Lebow Weeks in Treatment: 18 Information Obtained from: Patient Chief Complaint Bilateral LE Lymphedema and ulcers Electronic Signature(s) Signed: 01/06/2020 3:18:05 PM By: Worthy Keeler PA-C Entered By: Worthy Keeler on 01/06/2020 15:18:05 [place], [place] ([id number]) -------------------------------------------------------------------------------- HPI Details Patient Name: [name] Date of Service: 01/06/2020 3:15 PM Medical Record Number: [id number] Patient Account Number: [id number] Date of Birth/Sex: [birth year] (64 y.o. F) Treating RN: Army Melia Primary Care Provider: Neta Ehlers Other Clinician: Referring Provider: Neta Ehlers Treating Provider/Extender: Melburn Hake, Yunique Dearcos Weeks in Treatment: 18 History of Present Illness HPI Description: 08/27/2019 patient presents today for initial evaluation here in our clinic concerning issues that she has been having with her bilateral lower extremities with lymphedema and weeping. She is previously been a patient over the past year of Dr. Jerilee Hoh at the wound care center in [facility]. With that being said she subsequently was recommended by the lymphedema clinic here in [place] to follow-up with [place] here instead of going back to [place] since they were in closer contact with the clinic here than they are obviously. With that being said the patient states that is why she came her way in order to be evaluated at this point. Fortunately there is no signs of active infection at this time. No  fevers, chills, nausea, vomiting, or diarrhea. The patient does have a history of lymphedema, hypertension, long-term use of anticoagulant therapy, and obstructive sleep apnea. During the visit today and from checking into the discharge she was prone to unexpectedly falling asleep. I did discuss this with her daughter as well who states that this is very common for her she states she is not really sure how compliant her mother is with her CPAP machine that may be part of the issue. The patient did have ABIs performed which revealed have copies of his well today that was on 05/01/2019 and it showed a right ABI of 1.17 with a TBI of 1.12 and a left ABI of 1.24 with a TBI of 0.96. She has had 2 transfusions in the past 2 weeks apparently as well. 08/31/2019 upon evaluation today patient actually appears to be doing somewhat better in regard to her lower extremity ulcers. She still has a tremendous amount of drainage from the left lower extremity unfortunately but her right lower extremity is doing much better which is great news. Overall I am very pleased in this regard. With regard to home health we finally got things settled so home health will be coming out to see her this would be very beneficial in my opinion as I think they will be able to help her tremendously with more frequent dressing changes. 12/28-Patient is back here at 1 week visit, with regard to her bilateral lower extremity ulcers, she has a significant degree of lymphedema, significant degree of drainage more from the left and which continues to be an issue, patient had not been using her lymphedema pumps as recommended, she is concerned about amount of drainage and how quickly it soaks the dressing, she is concerned about the lymphedema pump sticking to her skin and was told to use it on the wraps.  She will get in touch with her cardiologist about adjusting her Lasix dose. 09/14/2019 on evaluation today patient appears to be doing poorly  in regard to her lower extremities especially left lower extremity. Unfortunately she is continue to have a significant amount of drainage which is preventing her from being able to heal keeping the wounds very macerated. She also has some warmth to touch in the anterior to medial portion of the left lower extremity compared to the remainder of the wound which does have made somewhat concerned about the possibility of cellulitis. Obviously it can be difficult to determine just strictly lymphedema versus cellulitis but right now I am somewhat concerned. She tells me that she can put a wrap on and at least has to change this twice a day or else she will be walking around leaving puddles of water behind her. Obviously this is fairly significant. She did see her primary care provider they did not see anything going on that they felt needed to be addressed from a diuretic standpoint but nonetheless I am still somewhat concerned based on what we are seeing to be honest. The patient and her husband are likewise concerned. 09/17/2019 patient is seen today in follow-up I last saw her on Tuesday at that point based on the severity of her lower extremity edema, weeping, and erythema I did send her to the ER for further evaluation and treatment. She subsequently did go for that evaluation. It did appear that based on my review of the notes this is dated 09/14/2019 that the patient did have a BNP of 149.4. Which was notable. Subsequently she was given a dose of IV vancomycin as well as doxycycline and Keflex. It appears however that she was discharged on oral clindamycin 150 mg every 6 hours and to be honest compared to what I saw on Tuesday she actually seems to be doing somewhat better. Fortunately there is no evidence of active infection at this time which is good news. In the interim she did yesterday see her cardiologist as well Dr. Bettina Gavia. That was on 09/16/2019. Upon review of this note it does appear that his  recommendation at this point was to increase the furosemide to 40 mg 2 times a day. He also recommend an increase of potassium 10 mg taken 2 times a day. His other recommendation was that the patient needs to have a follow-up with her hematology oncologist in order to see if they can do anything about iron transfusions for her as unfortunately her iron is still quite low and obviously oral supplementation is not going to be the best way to get this up. 09/24/2019 upon evaluation today patient appears to be doing quite well with regard to her lower extremities. The right lower extremity in particular has dramatically improved to be honest so has the left in my opinion. There does not appear to be any signs of active infection which is good news. No fevers, chills, nausea, vomiting, or diarrhea. Patient is still taking the clindamycin at this time. 10/01/2019 on evaluation today patient appears to be showing some signs of improvement at this point in regard to her lower extremities. The right lower extremity is doing quite well the left lower extremity is still draining but again I do believe this is significantly better compared to what it was in the past. Fortunately there is no evidence of active infection at this time. No fever chills noted 10/08/2019 upon evaluation today patient appears to have smaller areas of erythema and  weeping on the left lower extremity as compared to last week's evaluation. The right leg is doing excellent. With that being said unfortunately she is still having a lot of significant swelling on the left in fact the swelling is worse today than it was last week. With that being said she is telling me that she is able to keep her wraps on now for at least the day sometimes even up to 30 hours before they are having to be changed. When they do have to change it they are using her lymphedema wraps at home to reapply what they can. She only changes that she tells me when it actually  gets to the point that is draining so much that it leaks onto her floor. 10/15/2019 upon evaluation today patient appears to actually be doing very well with regard to her lower extremities in fact I believe the right lower extremity may be completely sealed up that we will likely watch it 1 more week. The left lower extremity is doing better she does not seem to be having as much weeping and leaking but still this is going on. Home health still has not gotten the [place] there is still just using ABD pads I believe was able to get Lauraine Rinne this would actually help out. 10/21/2019 upon evaluation today patient appears to be doing much better in regard to her lower extremities bilaterally. The right lower extremity has 1 area remaining that still draining the left lower extremity is still draining but not nearly what it was in the past. Overall I am extremely pleased with how things seem to be progressing. ELISHAH, BEHEN ([id number]) 10/29/2019 upon evaluation today patient actually appears to be doing quite well in regard to her lower extremities bilaterally both extremities show signs of improvement. The biggest issue we seem to be having is that unfortunately the home health nurse does not seem to be wrapping this quite right in that it seems to slide down within just a few hours of them coming out to rewrap her according to the patient and her husband. The wraps we put on the state last much longer in fact generally until they are taken off. Fortunately there is no signs of infection although unfortunately she does still have a few open areas on both lower extremities at this time. 11/12/2019 upon evaluation today patient appears to be doing excellent in regard to her wounds. She does not have weeping like she is had in the past and overall I feel like she is doing quite well. With that being said this is something that has continued to give her trouble and that she states that the wrap is sliding  down especially when home health puts this on. She tells me that the ones we put on do not seem to be sliding down at this point. Nonetheless I think that this is definitely something of concern. Obviously if it slides down can cause trouble with getting the area to effectively heal and continue to do well. 11/19/2019 upon evaluation today patient actually appears to be doing very well with regard to her bilateral lower extremities. She just has a couple small areas 1 on the right 2 on the left that are still open at this point and all seem to be headed in the right direction. She is no longer saturating her wraps which is also good news. Overall I am extremely pleased with the progress that is been made up to this point. I do believe we  may be able to switch into her lymphedema wraps at this point. 12/09/2019 unfortunately the patient appears to be doing more poorly this week in regard to her legs. We actually transitioned her to her lymphedema wraps last week although I do not believe they are controlling her edema well enough. She has areas that have reopened on the right and just a very small weeping region in fact there is not even really a wound here. On the left she has wounds that have reopened and appear to be doing worse she is weeping quite significantly. I think Lauraine Rinne is good to be the ideal thing here I do not even think we can use the alginate as I am afraid that we just become too moist at this point. Fortunately there is no signs of active infection at this time. She is on doxycycline for completely separate issue dealing with a uterine fibroid. 12/17/2019 upon evaluation today patient actually appears to be doing excellent in regard to her wounds currently of the left lower extremity. These are all measuring better and looking well. The right lower extremity is completely sealed up and doing excellent. Overall very pleased with how things seem to be progressing. Her swelling is also  down from last week. 12/23/2019 upon evaluation today patient's legs actually appear to be very dry compared to normal this is actually a good thing for her as unfortunately when she starts to swell too much she tends to weep more which leads to greater issues. Fortunately there is no signs of active infection at this time which is excellent. Overall I am very pleased with how things seem to be progressing. The only real issue we had is that unfortunately her wrap started to slide down apparently we did use the [place] paste to anchor last time I think that something we can definitely do. 01/06/20 upon evaluation today patient appears to be doing decently well in regard to her lower extremities. The right lower extremity appears to be completely healed which is great news. The left lower extremity unfortunately is still weeping or rather leaking. The right is not. With that being said there is no signs of active infection at this time which is great news. No fevers, chills, nausea, vomiting, or diarrhea. Electronic Signature(s) Signed: 01/06/2020 5:33:01 PM By: Worthy Keeler PA-C Entered By: Worthy Keeler on 01/06/2020 17:33:01 Salmi, [name] ([id number]) -------------------------------------------------------------------------------- Physical Exam Details Patient Name: LAKAISHA, EBANKS L. Date of Service: 01/06/2020 3:15 PM Medical Record Number: [id number] Patient Account Number: [id number] Date of Birth/Sex: 08-27-1956 (64 y.o. F) Treating RN: Army Melia Primary Care Provider: Neta Ehlers Other Clinician: Referring Provider: Neta Ehlers Treating Provider/Extender: Melburn Hake, Briannia Laba Weeks in Treatment: 65 Constitutional Well-nourished and well-hydrated in no acute distress. Respiratory normal breathing without difficulty. Psychiatric this patient is able to make decisions and demonstrates good insight into disease process. Alert and Oriented x 3. pleasant and  cooperative. Notes Upon inspection patient's wound bed actually showed signs of good granulation at this time. Fortunately there is no evidence of active infection which is great news. Overall I'm very pleased with how things seem to be progressing. Electronic Signature(s) Signed: 01/06/2020 5:33:19 PM By: Worthy Keeler PA-C Entered By: Worthy Keeler on 01/06/2020 17:33:18 [place], [name] ([id number]) -------------------------------------------------------------------------------- Physician Orders Details Patient Name: [name]. Date of Service: 01/06/2020 3:15 PM Medical Record Number: [id number] Patient Account Number: [id number] Date of Birth/Sex: July 09, 1956 (64 y.o. 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 Wound #5 Left,Proximal Lower Leg o Cleanse wound with mild soap and water o Dial antibacterial soap, wash wounds, rinse and pat dry prior to dressing wounds o May Shower, gently pat wound dry prior to applying new dressing. Skin Barriers/Peri-Wound Care Wound #1 Left,Medial,Anterior Lower Leg o Triamcinolone Acetonide Ointment (TCA) - to either leg on places that itch - ask  patient Wound #5 Left,Proximal Lower Leg o Triamcinolone Acetonide Ointment (TCA) - to either leg on places that itch - ask patient Secondary Dressing Wound #1 Left,Medial,Anterior Lower Leg o XtraSorb Dressing Change Frequency Wound #1 Left,Medial,Anterior Lower Leg o Change dressing every week - Nurse visit as needed Wound #5 Left,Proximal Lower Leg o Change dressing every week - Nurse visit as needed Follow-up Appointments Wound #1 Left,Medial,Anterior Lower Leg o Return Appointment in 1 week. Wound #5 Left,Proximal Lower Leg o Return Appointment in 1 week. Edema Control Wound #1 Left,Medial,Anterior Lower Leg o 4 Layer Compression System - Bilateral o Elevate legs to the level of the heart and pump ankles as often as possible o Compression Pump: Use compression pump on left lower extremity for 60 minutes, twice daily. o Compression Pump: Use compression pump on right lower extremity for 60 minutes, twice daily. Wound #5 Left,Proximal Lower Leg o 4 Layer Compression System - Bilateral Dexheimer, Seng L. ([id number]) o Elevate legs to the level of the heart and pump ankles as often as possible o Compression Pump: Use compression pump on left lower extremity for 60 minutes, twice daily. o Compression Pump: Use compression pump on right lower extremity for 60 minutes, twice daily. Electronic Signature(s) Signed: 01/06/2020 4:32:24 PM By: Army Melia Signed: 01/06/2020 5:56:59 PM By: Worthy Keeler PA-C Entered By: Army Melia on 01/06/2020 16:02:55 [place], [name] ([id number]) -------------------------------------------------------------------------------- Problem List Details Patient Name: CARRESSA, KIESOW L. Date of Service: 01/06/2020 3:15 PM Medical Record Number: [id number] Patient Account Number: [id number] Date of Birth/Sex: Aug 04, 1956 (64 y.o. F) Treating RN: Army Melia Primary Care Provider: Neta Ehlers Other Clinician: Referring Provider:  Neta Ehlers Treating Provider/Extender: Melburn Hake, Shaneya Taketa Weeks in Treatment: 18 Active Problems ICD-10 Encounter Code Description Active Date MDM Diagnosis I89.0 Lymphedema, not elsewhere classified 08/27/2019 No Yes L97.812 Non-pressure chronic ulcer of other part of right lower leg with fat layer 08/27/2019 No Yes exposed L97.822 Non-pressure chronic ulcer of other part of left lower leg with fat layer 08/27/2019 No Yes exposed [place] (primary) hypertension 08/27/2019 No Yes Z79.01 Long term (current) use of anticoagulants 08/27/2019 No Yes Inactive Problems Resolved Problems Electronic Signature(s) Signed: 01/06/2020 3:17:59 PM By: Worthy Keeler PA-C Entered By: Worthy Keeler on 01/06/2020 15:17:59 [place]els, [place] ([id number]) -------------------------------------------------------------------------------- Progress Note Details Patient Name: [name] Date of Service: 01/06/2020 3:15 PM Medical Record Number: [id number] Patient Account Number: [id number] Date of  Birth/Sex: 02/05/1956 (64 y.o. F) Treating RN: Army Melia Primary Care Provider: Neta Ehlers Other Clinician: Referring Provider: Neta Ehlers Treating Provider/Extender: Melburn Hake, Jenisis Harmsen Weeks in Treatment: 18 Subjective Chief Complaint Information obtained from Patient Bilateral LE Lymphedema and ulcers History of Present Illness (HPI) 08/27/2019 patient presents today for initial evaluation here in our clinic concerning issues that she has been having with her bilateral lower extremities with lymphedema and weeping. She is previously been a patient over the past year of Dr. Jerilee Hoh at the wound care center in [facility]. With that being said she subsequently was recommended by the lymphedema clinic here in [place] to follow-up with [place] here instead of going back to [place] since they were in closer contact with the clinic here than they are obviously. With that being  said the patient states that is why she came her way in order to be evaluated at this point. Fortunately there is no signs of active infection at this time. No fevers, chills, nausea, vomiting, or diarrhea. The patient does have a history of lymphedema, hypertension, long-term use of anticoagulant therapy, and obstructive sleep apnea. During the visit today and from checking into the discharge she was prone to unexpectedly falling asleep. I did discuss this with her daughter as well who states that this is very common for her she states she is not really sure how compliant her mother is with her CPAP machine that may be part of the issue. The patient did have ABIs performed which revealed have copies of his well today that was on 05/01/2019 and it showed a right ABI of 1.17 with a TBI of 1.12 and a left ABI of 1.24 with a TBI of 0.96. She has had 2 transfusions in the past 2 weeks apparently as well. 08/31/2019 upon evaluation today patient actually appears to be doing somewhat better in regard to her lower extremity ulcers. She still has a tremendous amount of drainage from the left lower extremity unfortunately but her right lower extremity is doing much better which is great news. Overall I am very pleased in this regard. With regard to home health we finally got things settled so home health will be coming out to see her this would be very beneficial in my opinion as I think they will be able to help her tremendously with more frequent dressing changes. 12/28-Patient is back here at 1 week visit, with regard to her bilateral lower extremity ulcers, she has a significant degree of lymphedema, significant degree of drainage more from the left and which continues to be an issue, patient had not been using her lymphedema pumps as recommended, she is concerned about amount of drainage and how quickly it soaks the dressing, she is concerned about the lymphedema pump sticking to her skin and was told to  use it on the wraps. She will get in touch with her cardiologist about adjusting her Lasix dose. 09/14/2019 on evaluation today patient appears to be doing poorly in regard to her lower extremities especially left lower extremity. Unfortunately she is continue to have a significant amount of drainage which is preventing her from being able to heal keeping the wounds very macerated. She also has some warmth to touch in the anterior to medial portion of the left lower extremity compared to the remainder of the wound which does have made somewhat concerned about the possibility of cellulitis. Obviously it can be difficult to determine just strictly lymphedema versus cellulitis but right now I  am somewhat concerned. She tells me that she can put a wrap on and at least has to change this twice a day or else she will be walking around leaving puddles of water behind her. Obviously this is fairly significant. She did see her primary care provider they did not see anything going on that they felt needed to be addressed from a diuretic standpoint but nonetheless I am still somewhat concerned based on what we are seeing to be honest. The patient and her husband are likewise concerned. 09/17/2019 patient is seen today in follow-up I last saw her on Tuesday at that point based on the severity of her lower extremity edema, weeping, and erythema I did send her to the ER for further evaluation and treatment. She subsequently did go for that evaluation. It did appear that based on my review of the notes this is dated 09/14/2019 that the patient did have a BNP of 149.4. Which was notable. Subsequently she was given a dose of IV vancomycin as well as doxycycline and Keflex. It appears however that she was discharged on oral clindamycin 150 mg every 6 hours and to be honest compared to what I saw on Tuesday she actually seems to be doing somewhat better. Fortunately there is no evidence of active infection at this time which  is good news. In the interim she did yesterday see her cardiologist as well Dr. Bettina Gavia. That was on 09/16/2019. Upon review of this note it does appear that his recommendation at this point was to increase the furosemide to 40 mg 2 times a day. He also recommend an increase of potassium 10 mg taken 2 times a day. His other recommendation was that the patient needs to have a follow-up with her hematology oncologist in order to see if they can do anything about iron transfusions for her as unfortunately her iron is still quite low and obviously oral supplementation is not going to be the best way to get this up. 09/24/2019 upon evaluation today patient appears to be doing quite well with regard to her lower extremities. The right lower extremity in particular has dramatically improved to be honest so has the left in my opinion. There does not appear to be any signs of active infection which is good news. No fevers, chills, nausea, vomiting, or diarrhea. Patient is still taking the clindamycin at this time. 10/01/2019 on evaluation today patient appears to be showing some signs of improvement at this point in regard to her lower extremities. The right lower extremity is doing quite well the left lower extremity is still draining but again I do believe this is significantly better compared to what it was in the past. Fortunately there is no evidence of active infection at this time. No fever chills noted 10/08/2019 upon evaluation today patient appears to have smaller areas of erythema and weeping on the left lower extremity as compared to last week's evaluation. The right leg is doing excellent. With that being said unfortunately she is still having a lot of significant swelling on the left in fact the swelling is worse today than it was last week. With that being said she is telling me that she is able to keep her wraps on now for at least the day sometimes even up to 30 hours before they are having to be  changed. When they do have to change it they are using her lymphedema wraps at home to reapply what they can. She only changes that she tells me  when it actually gets to the point that is draining so much that it leaks onto her floor. 10/15/2019 upon evaluation today patient appears to actually be doing very well with regard to her lower extremities in fact I believe the right lower extremity may be completely sealed up that we will likely watch it 1 more week. The left lower extremity is doing better she does not seem to be having as much weeping and leaking but still this is going on. Home health still has not gotten the [place] there is still just using ABD pads I Scaffidi, [place]. ([id number]) believe was able to get Lauraine Rinne this would actually help out. 10/21/2019 upon evaluation today patient appears to be doing much better in regard to her lower extremities bilaterally. The right lower extremity has 1 area remaining that still draining the left lower extremity is still draining but not nearly what it was in the past. Overall I am extremely pleased with how things seem to be progressing. 10/29/2019 upon evaluation today patient actually appears to be doing quite well in regard to her lower extremities bilaterally both extremities show signs of improvement. The biggest issue we seem to be having is that unfortunately the home health nurse does not seem to be wrapping this quite right in that it seems to slide down within just a few hours of them coming out to rewrap her according to the patient and her husband. The wraps we put on the state last much longer in fact generally until they are taken off. Fortunately there is no signs of infection although unfortunately she does still have a few open areas on both lower extremities at this time. 11/12/2019 upon evaluation today patient appears to be doing excellent in regard to her wounds. She does not have weeping like she is had in the past and overall  I feel like she is doing quite well. With that being said this is something that has continued to give her trouble and that she states that the wrap is sliding down especially when home health puts this on. She tells me that the ones we put on do not seem to be sliding down at this point. Nonetheless I think that this is definitely something of concern. Obviously if it slides down can cause trouble with getting the area to effectively heal and continue to do well. 11/19/2019 upon evaluation today patient actually appears to be doing very well with regard to her bilateral lower extremities. She just has a couple small areas 1 on the right 2 on the left that are still open at this point and all seem to be headed in the right direction. She is no longer saturating her wraps which is also good news. Overall I am extremely pleased with the progress that is been made up to this point. I do believe we may be able to switch into her lymphedema wraps at this point. 12/09/2019 unfortunately the patient appears to be doing more poorly this week in regard to her legs. We actually transitioned her to her lymphedema wraps last week although I do not believe they are controlling her edema well enough. She has areas that have reopened on the right and just a very small weeping region in fact there is not even really a wound here. On the left she has wounds that have reopened and appear to be doing worse she is weeping quite significantly. I think Lauraine Rinne is good to be the ideal thing here I do not  even think we can use the alginate as I am afraid that we just become too moist at this point. Fortunately there is no signs of active infection at this time. She is on doxycycline for completely separate issue dealing with a uterine fibroid. 12/17/2019 upon evaluation today patient actually appears to be doing excellent in regard to her wounds currently of the left lower extremity. These are all measuring better and looking  well. The right lower extremity is completely sealed up and doing excellent. Overall very pleased with how things seem to be progressing. Her swelling is also down from last week. 12/23/2019 upon evaluation today patient's legs actually appear to be very dry compared to normal this is actually a good thing for her as unfortunately when she starts to swell too much she tends to weep more which leads to greater issues. Fortunately there is no signs of active infection at this time which is excellent. Overall I am very pleased with how things seem to be progressing. The only real issue we had is that unfortunately her wrap started to slide down apparently we did use the [place] paste to anchor last time I think that something we can definitely do. 01/06/20 upon evaluation today patient appears to be doing decently well in regard to her lower extremities. The right lower extremity appears to be completely healed which is great news. The left lower extremity unfortunately is still weeping or rather leaking. The right is not. With that being said there is no signs of active infection at this time which is great news. No fevers, chills, nausea, vomiting, or diarrhea. Objective Constitutional Well-nourished and well-hydrated in no acute distress. Vitals Time Taken: 3:50 PM, Height: 62 in, Weight: 290 lbs, BMI: 53, Temperature: 98.1 F, Pulse: 82 bpm, Respiratory Rate: 20 breaths/min, Blood Pressure: 147/52 mmHg. Respiratory normal breathing without difficulty. Psychiatric this patient is able to make decisions and demonstrates good insight into disease process. Alert and Oriented x 3. pleasant and cooperative. General Notes: Upon inspection patient's wound bed actually showed signs of good granulation at this time. Fortunately there is no evidence of active infection which is great news. Overall I'm very pleased with how things seem to be progressing. Integumentary (Hair, Skin) Wound #1 status is Open.  Original cause of wound was Gradually Appeared. The wound is located on the Left,Medial,Anterior Lower Leg. The wound measures 3cm length x 1.8cm width x 0.1cm depth; 4.241cm^2 area and 0.424cm^3 volume. There is Fat Layer (Subcutaneous Tissue) Exposed exposed. There is a medium amount of serosanguineous drainage noted. The wound margin is flat and intact. There is large (67-100%) red granulation within the wound bed. There is a small (1-33%) amount of necrotic tissue within the wound bed including Adherent Slough. Wound #5 status is Open. Original cause of wound was Gradually Appeared. The wound is located on the Left,Proximal Lower Leg. The wound Posten, Emerson ([id number]) measures 0.4cm length x 0.6cm width x 0.1cm depth; 0.188cm^2 area and 0.019cm^3 volume. There is Fat Layer (Subcutaneous Tissue) Exposed exposed. There is a medium amount of serosanguineous drainage noted. There is large (67-100%) red granulation within the wound bed. There is a small (1-33%) amount of necrotic tissue within the wound bed including Adherent Slough. Assessment Active Problems ICD-10 Lymphedema, not elsewhere classified Non-pressure chronic ulcer of other part of right lower leg with fat layer exposed Non-pressure chronic ulcer of other part of left lower leg with fat layer exposed Essential (primary) hypertension Long term (current) use of anticoagulants Procedures  Wound #1 Pre-procedure diagnosis of Wound #1 is a Lymphedema located on the Left,Medial,Anterior Lower Leg . There was a Four Layer Compression Therapy Procedure by Army Melia, RN. Post procedure Diagnosis Wound #1: Same as Pre-Procedure Wound #5 Pre-procedure diagnosis of Wound #5 is a Lymphedema located on the Left,Proximal Lower Leg . There was a Four Layer Compression Therapy Procedure by Army Melia, RN. Post procedure Diagnosis Wound #5: Same as Pre-Procedure Plan Wound Cleansing: Wound #1 Left,Medial,Anterior Lower Leg: Cleanse  wound with mild soap and water Dial antibacterial soap, wash wounds, rinse and pat dry prior to dressing wounds May Shower, gently pat wound dry prior to applying new dressing. Wound #5 Left,Proximal Lower Leg: Cleanse wound with mild soap and water Dial antibacterial soap, wash wounds, rinse and pat dry prior to dressing wounds May Shower, gently pat wound dry prior to applying new dressing. Skin Barriers/Peri-Wound Care: Wound #1 Left,Medial,Anterior Lower Leg: Triamcinolone Acetonide Ointment (TCA) - to either leg on places that itch - ask patient Wound #5 Left,Proximal Lower Leg: Triamcinolone Acetonide Ointment (TCA) - to either leg on places that itch - ask patient Secondary Dressing: Wound #1 Left,Medial,Anterior Lower Leg: XtraSorb Dressing Change Frequency: Wound #1 Left,Medial,Anterior Lower Leg: Change dressing every week - Nurse visit as needed Wound #5 Left,Proximal Lower Leg: Change dressing every week - Nurse visit as needed Follow-up Appointments: Wound #1 Left,Medial,Anterior Lower Leg: Return Appointment in 1 week. Wound #5 Left,Proximal Lower Leg: Return Appointment in 1 week. Edema Control: Wound #1 Left,Medial,Anterior Lower Leg: 4 Layer Compression System - Bilateral Elevate legs to the level of the heart and pump ankles as often as possible Compression Pump: Use compression pump on left lower extremity for 60 minutes, twice daily. Twining, [place] ([id number]) Compression Pump: Use compression pump on right lower extremity for 60 minutes, twice daily. Wound #5 Left,Proximal Lower Leg: 4 Layer Compression System - Bilateral Elevate legs to the level of the heart and pump ankles as often as possible Compression Pump: Use compression pump on left lower extremity for 60 minutes, twice daily. Compression Pump: Use compression pump on right lower extremity for 60 minutes, twice daily. 1. I would recommend currently that we go ahead and continue with the 4 layer  compression wraps along with the XtraSorb which does seem to be helpful for her currently. 2. I'm also going to suggest that we continue with the triamcinolone to her legs it does seem to have been beneficial as well. We will see patient back for reevaluation in 1 week here in the clinic. If anything worsens or changes patient will contact our office for additional recommendations. Electronic Signature(s) Signed: 01/06/2020 5:33:41 PM By: Worthy Keeler PA-C Entered By: Worthy Keeler on 01/06/2020 17:33:41 Reiley, [name] ([id number]) -------------------------------------------------------------------------------- SuperBill Details Patient Name: [name]. Date of Service: 01/06/2020 Medical Record Number: [id number] Patient Account Number: [id number] Date of Birth/Sex: [birth year] (64 y.o. F) Treating RN: Army Melia Primary Care Provider: Neta Ehlers Other Clinician: Referring Provider: Neta Ehlers Treating Provider/Extender: Melburn Hake, Licia Harl Weeks in Treatment: 18 Diagnosis Coding ICD-10 Codes Code Description I89.0 Lymphedema, not elsewhere classified L97.812 Non-pressure chronic ulcer of other part of right lower leg with fat layer exposed L97.822 Non-pressure chronic ulcer of other part of left lower leg with fat layer exposed [place] (primary) hypertension Z79.01 Long term (current) use of anticoagulants Facility Procedures CPT4: Description Modifier Quantity Code [id number] BILATERAL: Application of multi-layer venous compression system; leg (below knee), including 1 ankle  and foot. Physician Procedures CPT4 Code: [id number] Description: [id number] - WC PHYS LEVEL 3 - EST PT Modifier: Quantity: 1 CPT4 Code: Description: ICD-10 Diagnosis Description I89.0 Lymphedema, not elsewhere classified [id number] Non-pressure chronic ulcer of other part of right lower leg with fat la L97.822 Non-pressure chronic ulcer of other part of left lower leg with fat lay I10   Essential (primary) hypertension Modifier: yer exposed er exposed Quantity: Electronic Signature(s) Signed: 01/06/2020 5:33:59 PM By: Worthy Keeler PA-C Previous Signature: 01/06/2020 4:32:24 PM Version By: Army Melia Entered By: Worthy Keeler on 01/06/2020 17:33:59

## 2020-01-07 NOTE — Progress Notes (Addendum)
[name], [name] ([id number]) Visit Report for 01/06/2020 Arrival Information Details Patient Name: [name], [name]. Date of Service: 01/06/2020 3:15 PM Medical Record Number: [id number] Patient Account Number: [id number] Date of Birth/Sex: 13-Aug-1956 (64 y.o. F) Treating RN: Army Melia Primary Care Kataleyah Carducci: Neta Ehlers Other Clinician: Referring Keiston Manley: Neta Ehlers Treating Noa Galvao/Extender: Melburn Hake, HOYT Weeks in Treatment: 18 Visit Information History Since Last Visit Added or deleted any medications: No Patient Arrived: Ambulatory Any new allergies or adverse reactions: No Arrival Time: 15:47 Had a fall or experienced change in No Accompanied By: husband activities of daily living that may affect Transfer Assistance: None risk of falls: Patient Identification Verified: Yes Signs or symptoms of abuse/neglect since last visito No Secondary Verification Process Completed: Yes Hospitalized since last visit: No Patient Has Alerts: Yes Implantable device outside of the clinic excluding No Patient Alerts: Patient on Blood Thinner cellular tissue based products placed in the center warfarin since last visit: Not Diabetic Has Dressing in Place as Prescribed: No 04/29/2019 [facility] Pain Present Now: No ABI R)1.17 L)1.24 TBI R) 1.12 L) 0.96 Electronic Signature(s) Signed: 01/06/2020 4:37:37 PM By: Lorine Bears RCP, RRT, CHT Entered By: Lorine Bears on 01/06/2020 15:51:15 [name], [name] -------------------------------------------------------------------------------- Compression Therapy Details Patient Name: Martha Downs L. Date of Service: 01/06/2020 3:15 PM Medical Record Number: [id number] Patient Account Number: [id number] Date of Birth/Sex: [birth year] (64 y.o. F) Treating RN: Army Melia Primary Care Price Lachapelle: Neta Ehlers Other Clinician: Referring Tacie Mccuistion: Neta Ehlers Treating Ara Grandmaison/Extender: Melburn Hake,  HOYT Weeks in Treatment: 18 Compression Therapy Performed for Wound Assessment: Wound #1 Left,Medial,Anterior Lower Leg Performed By: Clinician Army Melia, RN Compression Type: Four Layer Post Procedure Diagnosis Same as Pre-procedure Electronic Signature(s) Signed: 01/06/2020 4:32:24 PM By: Army Melia Entered By: Army Melia on 01/06/2020 16:02:08 [name], [name] ([id number]) -------------------------------------------------------------------------------- Compression Therapy Details Patient Name: Martha Downs L. Date of Service: 01/06/2020 3:15 PM Medical Record Number: [id number] Patient Account Number: [id number] Date of Birth/Sex: 1956-08-07 (64 y.o. F) Treating RN: Army Melia Primary Care Cobie Leidner: Neta Ehlers Other Clinician: Referring Maragret Vanacker: Neta Ehlers Treating Ted Goodner/Extender: Melburn Hake, HOYT Weeks in Treatment: 18 Compression Therapy Performed for Wound Assessment: Wound #5 Left,Proximal Lower Leg Performed By: Clinician Army Melia, RN Compression Type: Four Layer Post Procedure Diagnosis Same as Pre-procedure Electronic Signature(s) Signed: 01/06/2020 4:32:24 PM By: Army Melia Entered By: Army Melia on 01/06/2020 16:02:08 [name] ([id number]) -------------------------------------------------------------------------------- Encounter Discharge Information Details Patient Name: Martha Downs L. Date of Service: 01/06/2020 3:15 PM Medical Record Number: [id number] Patient Account Number: [id number] Date of Birth/Sex: 1956-08-25 (64 y.o. F) Treating RN: Army Melia Primary Care Langdon Crosson: Neta Ehlers Other Clinician: Referring Hayla Hinger: Neta Ehlers Treating Horace Wishon/Extender: Melburn Hake, HOYT Weeks in Treatment: 18 Encounter Discharge Information Items Discharge Condition: Stable Ambulatory Status: Ambulatory Discharge Destination: Home Transportation: Private Auto Accompanied By: self Schedule Follow-up Appointment:  Yes Clinical Summary of Care: Electronic Signature(s) Signed: 01/06/2020 4:32:24 PM By: Army Melia Entered By: Army Melia on 01/06/2020 [name], [name] ([id number]) -------------------------------------------------------------------------------- Lower Extremity Assessment Details Patient Name: Martha Downs L. Date of Service: 01/06/2020 3:15 PM Medical Record Number: [id number] Patient Account Number: [id number] Date of Birth/Sex: [birth year] (64 y.o. F) Treating RN: Army Melia Primary Care Eldin Bonsell: Neta Ehlers Other Clinician: Referring Ayeisha Lindenberger: Neta Ehlers Treating Sajjad Honea/Extender: STONE III, HOYT Weeks in Treatment: 18 Edema Assessment Assessed: [Left: No] [Right: No] Edema: [Left: Yes] [Right: Yes] Calf Left: Right: Point of Measurement:  32 cm From Medial Instep 59 cm 51 cm Ankle Left: Right: Point of Measurement: 10 cm From Medial Instep 37 cm 32 cm Vascular Assessment Pulses: Dorsalis Pedis Palpable: [Left:Yes] [Right:Yes] Electronic Signature(s) Signed: 01/06/2020 4:32:24 PM By: Army Melia Entered By: Army Melia on 01/06/2020 15:57:24 [name], [name] -------------------------------------------------------------------------------- Multi Wound Chart Details Patient Name: Martha Downs L. Date of Service: 01/06/2020 3:15 PM Medical Record Number: [id number] Patient Account Number: [id number] Date of Birth/Sex: 1956/08/15 (64 y.o. F) Treating RN: Army Melia Primary Care Morrie Daywalt: Neta Ehlers Other Clinician: Referring Tykel Badie: Neta Ehlers Treating Quadre Bristol/Extender: Melburn Hake, HOYT Weeks in Treatment: 18 Vital Signs Height(in): 62 Pulse(bpm): 82 Weight(lbs): 290 Blood Pressure(mmHg): 147/52 Body Mass Index(BMI): 53 Temperature(F): 98.1 Respiratory Rate(breaths/min): 20 Photos: [N/A:N/A] Wound Location: Left, Medial, Anterior Lower Leg Left, Proximal Lower Leg N/A Wounding Event: Gradually Appeared Gradually  Appeared N/A Primary Etiology: Lymphedema Lymphedema N/A Comorbid History: Anemia, Coronary Artery Disease, Anemia, Coronary Artery Disease, N/A Hypertension Hypertension Date Acquired: 08/04/2019 10/21/2019 N/A Weeks of Treatment: 18 9 N/A Wound Status: Open Open N/A Measurements L x W x D (cm) 3x1.8x0.1 0.4x0.6x0.1 N/A Area (cm) : 4.241 0.188 N/A Volume (cm) : 0.424 0.019 N/A % Reduction in Area: 99.60% 20.30% N/A % Reduction in Volume: 99.60% 20.80% N/A Classification: Full Thickness Without Exposed Full Thickness Without Exposed N/A Support Structures Support Structures Exudate Amount: Medium Medium N/A Exudate Type: Serosanguineous Serosanguineous N/A Exudate Color: red, brown red, brown N/A Wound Margin: Flat and Intact N/A N/A Granulation Amount: Large (67-100%) Large (67-100%) N/A Granulation Quality: Red Red N/A Necrotic Amount: Small (1-33%) Small (1-33%) N/A Exposed Structures: Fat Layer (Subcutaneous Tissue) Fat Layer (Subcutaneous Tissue) N/A Exposed: Yes Exposed: Yes Fascia: No Fascia: No Tendon: No Tendon: No Muscle: No Muscle: No Joint: No Joint: No Bone: No Bone: No Epithelialization: Small (1-33%) None N/A Treatment Notes Electronic Signature(s) Signed: 01/06/2020 4:32:24 PM By: Army Melia Entered By: Army Melia on 01/06/2020 15:59:19 [name], [name] ([id number]) -------------------------------------------------------------------------------- [place] Details Patient Name: Martha Siren. Date of Service: 01/06/2020 3:15 PM Medical Record Number: [id number] Patient Account Number: [id number] Date of Birth/Sex: 12-Oct-1955 (64 y.o. F) Treating RN: Army Melia Primary Care Shree Espey: Neta Ehlers Other Clinician: Referring Latifah Padin: Neta Ehlers Treating Joleigh Mineau/Extender: Melburn Hake, HOYT Weeks in Treatment: 64 Active Inactive Abuse / Safety / Falls / Self Care Management Nursing Diagnoses: Potential for  falls Goals: Patient will not experience any injury related to falls Date Initiated: 08/27/2019 Target Resolution Date: 11/20/2019 Goal Status: Active Interventions: Assess fall risk on admission and as needed Notes: Orientation to the Wound Care Program Nursing Diagnoses: Knowledge deficit related to the wound healing center program Goals: Patient/caregiver will verbalize understanding of the [place] Program Date Initiated: 08/27/2019 Target Resolution Date: 11/20/2019 Goal Status: Active Interventions: Provide education on orientation to the wound center Notes: Venous Leg Ulcer Nursing Diagnoses: Potential for venous Insuffiency (use before diagnosis confirmed) Goals: Patient will maintain optimal edema control Date Initiated: 08/27/2019 Target Resolution Date: 11/20/2019 Goal Status: Active Interventions: Assess peripheral edema status every visit. Notes: Wound/Skin Impairment Nursing Diagnoses: Impaired tissue integrity Goals: Ulcer/skin breakdown will heal within 14 weeks Date Initiated: 08/27/2019 Target Resolution Date: 11/20/2019 Goal Status: Active [name], [name] ([id number]) Interventions: Assess patient/caregiver ability to obtain necessary supplies Assess patient/caregiver ability to perform ulcer/skin care regimen upon admission and as needed Assess ulceration(s) every visit Notes: Electronic Signature(s) Signed: 01/06/2020 4:32:24 PM By: Army Melia Entered By: Army Melia on 01/06/2020  15:59:11 [name], [name] ([id number]) -------------------------------------------------------------------------------- Pain Assessment Details Patient Name: [name], [name]. Date of Service: 01/06/2020 3:15 PM Medical Record Number: [id number] Patient Account Number: [id number] Date of Birth/Sex: 1956-07-28 (64 y.o. F) Treating RN: Army Melia Primary Care Sherod Cisse: Neta Ehlers Other Clinician: Referring Fin Hupp: Neta Ehlers Treating  Khaiden Segreto/Extender: Melburn Hake, HOYT Weeks in Treatment: 18 Active Problems Location of Pain Severity and Description of Pain Patient Has Paino No Site Locations Pain Management and Medication Current Pain Management: Electronic Signature(s) Signed: 01/06/2020 4:32:24 PM By: Army Melia Entered By: Army Melia on 01/06/2020 15:55:06 [name], [name] ([id number]) -------------------------------------------------------------------------------- Patient/Caregiver Education Details Patient Name: Martha Siren. Date of Service: 01/06/2020 3:15 PM Medical Record Number: [id number] Patient Account Number: [id number] Date of Birth/Gender: [birth year] (64 y.o. F) Treating RN: Army Melia Primary Care Physician: Neta Ehlers Other Clinician: Referring Physician: Neta Ehlers Treating Physician/Extender: Sharalyn Ink in Treatment: 18 Education Assessment Education Provided To: Patient Education Topics Provided Wound/Skin Impairment: Handouts: Caring for Your Ulcer Methods: Demonstration, Explain/Verbal Responses: State content correctly Electronic Signature(s) Signed: 01/06/2020 4:32:24 PM By: Army Melia Entered By: Army Melia on 01/06/2020 16:03:27 [name], [name] ([id number]) -------------------------------------------------------------------------------- Wound Assessment Details Patient Name: Martha Downs L. Date of Service: 01/06/2020 3:15 PM Medical Record Number: [id number] Patient Account Number: [id number] Date of Birth/Sex: April 08, 1956 (64 y.o. F) Treating RN: Army Melia Primary Care Klani Caridi: Neta Ehlers Other Clinician: Referring Niasia Lanphear: Neta Ehlers Treating Janisa Labus/Extender: Melburn Hake, HOYT Weeks in Treatment: 18 Wound Status Wound Number: 1 Primary Etiology: Lymphedema Wound Location: Left, Medial, Anterior Lower Leg Wound Status: Open Wounding Event: Gradually Appeared Comorbid History: Anemia, Coronary Artery Disease,  Hypertension Date Acquired: 08/04/2019 Weeks Of Treatment: 18 Clustered Wound: No Photos Wound Measurements Length: (cm) 3 Width: (cm) 1.8 Depth: (cm) 0.1 Area: (cm) 4.241 Volume: (cm) 0.424 % Reduction in Area: 99.6% % Reduction in Volume: 99.6% Epithelialization: Small (1-33%) Wound Description Classification: Full Thickness Without Exposed Support Struc Wound Margin: Flat and Intact Exudate Amount: Medium Exudate Type: Serosanguineous Exudate Color: red, brown tures Foul Odor After Cleansing: No Slough/Fibrino Yes Wound Bed Granulation Amount: Large (67-100%) Exposed Structure Granulation Quality: Red Fascia Exposed: No Necrotic Amount: Small (1-33%) Fat Layer (Subcutaneous Tissue) Exposed: Yes Necrotic Quality: Adherent Slough Tendon Exposed: No Muscle Exposed: No Joint Exposed: No Bone Exposed: No Treatment Notes Wound #1 (Left, Medial, Anterior Lower Leg) Notes xsorb, 4 layer bilateral Electronic Signature(s) Signed: 01/06/2020 4:32:24 PM By: Augusto Gamble, [name] ([id number]) Entered By: Army Melia on 01/06/2020 15:55:43 [name], [place] ([id number]) -------------------------------------------------------------------------------- Wound Assessment Details Patient Name: Martha Downs L. Date of Service: 01/06/2020 3:15 PM Medical Record Number: [id number] Patient Account Number: [id number] Date of Birth/Sex: December 30, 1955 (64 y.o. F) Treating RN: Army Melia Primary Care Rhylan Gross: Neta Ehlers Other Clinician: Referring Vaniah Chambers: Neta Ehlers Treating Sydny Schnitzler/Extender: Melburn Hake, HOYT Weeks in Treatment: 18 Wound Status Wound Number: 5 Primary Etiology: Lymphedema Wound Location: Left, Proximal Lower Leg Wound Status: Open Wounding Event: Gradually Appeared Comorbid History: Anemia, Coronary Artery Disease, Hypertension Date Acquired: 10/21/2019 Weeks Of Treatment: 9 Clustered Wound: No Photos Wound Measurements Length: (cm) 0.4 Width:  (cm) 0.6 Depth: (cm) 0.1 Area: (cm) 0.188 Volume: (cm) 0.019 % Reduction in Area: 20.3% % Reduction in Volume: 20.8% Epithelialization: None Wound Description Classification: Full Thickness Without Exposed Support Struc Exudate Amount: Medium Exudate Type: Serosanguineous Exudate Color: red, brown tures Foul Odor After Cleansing: No Slough/Fibrino Yes Wound Bed Granulation Amount: Large (67-100%) Exposed  Structure Granulation Quality: Red Fascia Exposed: No Necrotic Amount: Small (1-33%) Fat Layer (Subcutaneous Tissue) Exposed: Yes Necrotic Quality: Adherent Slough Tendon Exposed: No Muscle Exposed: No Joint Exposed: No Bone Exposed: No Treatment Notes Wound #5 (Left, Proximal Lower Leg) Notes xsorb, 4 layer bilateral Electronic Signature(s) Signed: 01/06/2020 4:32:24 PM By: Augusto Gamble, [name] ([id number]) Entered By: Army Melia on 01/06/2020 15:56:01 [place], [place] ([id number]) -------------------------------------------------------------------------------- Vitals Details Patient Name: Martha Downs L. Date of Service: 01/06/2020 3:15 PM Medical Record Number: [id number] Patient Account Number: [id number] Date of Birth/Sex: 11-01-1955 (64 y.o. F) Treating RN: Army Melia Primary Care Raynisha Avilla: Neta Ehlers Other Clinician: Referring Darrious Youman: Neta Ehlers Treating Anaisabel Pederson/Extender: Melburn Hake, HOYT Weeks in Treatment: 18 Vital Signs Time Taken: 15:50 Temperature (F): 98.1 Height (in): 62 Pulse (bpm): 82 Weight (lbs): 290 Respiratory Rate (breaths/min): 20 Body Mass Index (BMI): 53 Blood Pressure (mmHg): 147/52 Reference Range: 80 - 120 mg / dl Electronic Signature(s) Signed: 01/06/2020 4:37:37 PM By: Lorine Bears RCP, RRT, CHT Entered By: Lorine Bears on 01/06/2020 15:54:39

## 2020-01-12 ENCOUNTER — Encounter: Payer: Medicare Other | Attending: Internal Medicine | Admitting: Internal Medicine

## 2020-01-12 ENCOUNTER — Other Ambulatory Visit: Payer: Self-pay

## 2020-01-12 DIAGNOSIS — G4733 Obstructive sleep apnea (adult) (pediatric): Secondary | ICD-10-CM | POA: Insufficient documentation

## 2020-01-12 DIAGNOSIS — L97822 Non-pressure chronic ulcer of other part of left lower leg with fat layer exposed: Secondary | ICD-10-CM | POA: Insufficient documentation

## 2020-01-12 DIAGNOSIS — I872 Venous insufficiency (chronic) (peripheral): Secondary | ICD-10-CM | POA: Insufficient documentation

## 2020-01-12 DIAGNOSIS — Z7901 Long term (current) use of anticoagulants: Secondary | ICD-10-CM | POA: Diagnosis not present

## 2020-01-12 DIAGNOSIS — I890 Lymphedema, not elsewhere classified: Secondary | ICD-10-CM | POA: Diagnosis not present

## 2020-01-12 DIAGNOSIS — L97812 Non-pressure chronic ulcer of other part of right lower leg with fat layer exposed: Secondary | ICD-10-CM | POA: Insufficient documentation

## 2020-01-13 NOTE — Progress Notes (Signed)
JAKAIRA, KLEINMAN ([id number]) Visit Report for 01/12/2020 Arrival Information Details Patient Name: [name], [name]. Date of Service: 01/12/2020 3:00 PM Medical Record Number: [id number] Patient Account Number: [id number] Date of Birth/Sex: Sep 26, 1955 (64 y.o. F) Treating RN: [name] Primary Care [name] Other Clinician: Referring [name] Treating [name]/Extender: [name] in Treatment: 42 Visit Information History Since Last Visit Added or deleted any medications: No Patient Arrived: Ambulatory Any new allergies or adverse reactions: No Arrival Time: 15:09 Had a fall or experienced change in No Accompanied By: husband activities of daily living that may affect Transfer Assistance: None risk of falls: Patient Identification Verified: Yes Signs or symptoms of abuse/neglect since last visito No Secondary Verification Process Completed: Yes Hospitalized since last visit: No Patient Has Alerts: Yes Implantable device outside of the clinic excluding No Patient Alerts: Patient on Blood Thinner cellular tissue based products placed in the center warfarin since last visit: Not Diabetic Has Dressing in Place as Prescribed: Yes 04/29/2019 [facility] Has Compression in Place as Prescribed: Yes ABI R)1.17 L)1.24 TBI R) 1.12 L) 0.96 Pain Present Now: No Electronic Signature(s) Signed: 01/12/2020 4:20:30 PM By: [name] RCP, RRT, CHT Entered By: [name] on 01/12/2020 15:11:54 [name] ([id number]) -------------------------------------------------------------------------------- Compression Therapy Details Patient Name: Martha Downs L. Date of Service: 01/12/2020 3:00 PM Medical Record Number: [id number] Patient Account Number: [id number] Date of Birth/Sex: 24-Apr-1956 (64 y.o. F) Treating RN: [name] Primary Care [name] Other Clinician: Referring [name] Treating [name]/Extender: [name] in Treatment: 19 Compression Therapy Performed for Wound Assessment: Wound #1 Left,Medial,Anterior Lower Leg Performed By: Clinician Martha Barman, RN Compression Type: Four Layer Post Procedure Diagnosis Same as Pre-procedure Notes bilateral legs Electronic Signature(s) Signed: 01/12/2020 5:22:43 PM By: [name], BSN, RN, CWS, Kim RN, BSN Entered By: [name], BSN, RN, CWS, [name] on 01/12/2020 15:31:14 [name], [name] ([id number]) -------------------------------------------------------------------------------- Compression Therapy Details Patient Name: [name], [name] Date of Service: 01/12/2020 3:00 PM Medical Record Number: [id number] Patient Account Number: [id number] Date of Birth/Sex: 14-Feb-1956 (64 y.o. F) Treating RN: [name] Primary Care Sahith Nurse: [name] Other Clinician: Referring [name] Treating [name]/Extender: [name] in Treatment: 19 Compression Therapy Performed for Wound Assessment: NonWound Condition Lymphedema - Bilateral Leg Performed By: Clinician Martha Barman, RN Compression Type: Four Layer Post Procedure Diagnosis Same as Pre-procedure Electronic Signature(s) Signed: 01/12/2020 5:22:43 PM By: [name], BSN, RN, CWS, Kim RN, BSN Entered By: [name], BSN, RN, CWS, [name] on 01/12/2020 15:31:34 Hritz, [name] ([id number]) -------------------------------------------------------------------------------- Encounter Discharge Information Details Patient Name: Martha Siren. Date of Service: 01/12/2020 3:00 PM Medical Record Number: [id number] Patient Account Number: [id number] Date of Birth/Sex: December 30, 1955 (64 y.o. F) Treating RN: [name] Primary Care Kayelee Herbig: [name] Other Clinician: Referring [name] Treating Auriella Wieand/Extender: [name] in Treatment: 30 Encounter Discharge Information Items Discharge Condition: Stable Ambulatory  Status: Ambulatory Discharge Destination: Home Transportation: Private Auto Accompanied By: husband Schedule Follow-up Appointment: Yes Clinical Summary of Care: Electronic Signature(s) Signed: 01/12/2020 5:22:43 PM By: [name], BSN, RN, CWS, Kim RN, BSN Entered By: [name], BSN, RN, CWS, [name] on 01/12/2020 15:33:49 [name], [name] ([id number]) -------------------------------------------------------------------------------- Lower Extremity Assessment Details Patient Name: [name], [name] Date of Service: 01/12/2020 3:00 PM Medical Record Number: [id number] Patient Account Number: [id number] Date of Birth/Sex: 05-14-1956 (64 y.o. F) Treating RN: [name] Primary Care [name]: Griffin Basil,  [name] Other Clinician: Referring Kyren Knick: [name] Treating Shontell Prosser/Extender: [name] in Treatment: 19 Edema Assessment Assessed: [Left: No] [Right: No] Edema: [Left: Yes] [Right: Yes] Calf Left: Right: Point of Measurement: 32 cm From Medial Instep 54 cm 51 cm Ankle Left: Right: Point of Measurement: 10 cm From Medial Instep 40 cm 32 cm Vascular Assessment Pulses: Dorsalis Pedis Palpable: [Left:Yes] [Right:Yes] Electronic Signature(s) Signed: 01/12/2020 4:11:09 PM By: [name] Entered By: [name] on 01/12/2020 15:18:21 [name], Martha [place] L. ([id number]) -------------------------------------------------------------------------------- Multi Wound Chart Details Patient Name: Martha Downs L. Date of Service: 01/12/2020 3:00 PM Medical Record Number: [id number] Patient Account Number: [id number] Date of Birth/Sex: [birth year] (64 y.o. F) Treating RN: [name] Primary Care Latisha Lasch: [name] Other Clinician: Referring Alanii Ramer: [name] Treating [name]/Extender: [name] in Treatment: 19 Vital Signs Height(in): 62 Pulse(bpm): 80 Weight(lbs): 290 Blood Pressure(mmHg): 128/59 Body Mass Index(BMI): 53 Temperature(F): 97.7 Respiratory  Rate(breaths/min): 20 Photos: [N/A:N/A] Wound Location: Left, Medial, Anterior Lower Leg Left, Proximal Lower Leg N/A Wounding Event: Gradually Appeared Gradually Appeared N/A Primary Etiology: Lymphedema Lymphedema N/A Comorbid History: Anemia, Coronary Artery Disease, Anemia, Coronary Artery Disease, N/A Hypertension Hypertension Date Acquired: 08/04/2019 10/21/2019 N/A Weeks of Treatment: 19 10 N/A Wound Status: Open Healed - Epithelialized N/A Measurements L x W x D (cm) 3.3x1.5x0.1 0x0x0 N/A Area (cm) : 3.888 0 N/A Volume (cm) : 0.389 0 N/A % Reduction in Area: 99.60% 100.00% N/A % Reduction in Volume: 99.60% 100.00% N/A Classification: Full Thickness Without Exposed Full Thickness Without Exposed N/A Support Structures Support Structures Exudate Amount: Medium Medium N/A Exudate Type: Serosanguineous Serosanguineous N/A Exudate Color: red, brown red, brown N/A Wound Margin: Flat and Intact N/A N/A Granulation Amount: None Present (0%) Large (67-100%) N/A Granulation Quality: N/A Red N/A Necrotic Amount: Large (67-100%) Small (1-33%) N/A Necrotic Tissue: Eschar Adherent Slough N/A Exposed Structures: Fat Layer (Subcutaneous Tissue) Fat Layer (Subcutaneous Tissue) N/A Exposed: Yes Exposed: Yes Fascia: No Fascia: No Tendon: No Tendon: No Muscle: No Muscle: No Joint: No Joint: No Bone: No Bone: No Epithelialization: Small (1-33%) None N/A Procedures Performed: Compression Therapy N/A N/A Treatment Notes Wound #1 (Left, Medial, Anterior Lower Leg) Notes xsorb, 4 layer bilateral [name], [name] ([id number]) Electronic Signature(s) Signed: 01/12/2020 4:28:02 PM By: Linton Ham MD Entered By: Linton Ham on 01/12/2020 15:51:48 [place], [name] ([id number]) -------------------------------------------------------------------------------- [place] Details Patient Name: [place], [name]. Date of Service: 01/12/2020 3:00 PM Medical Record Number:  [id number] Patient Account Number: [id number] Date of Birth/Sex: 02/12/1956 (64 y.o. F) Treating RN: [name] Primary Care Maddilyn Campus: [name] Other Clinician: Referring Sandie Swayze: [name] Treating Macey Wurtz/Extender: [name] in Treatment: 55 Active Inactive Abuse / Safety / Falls / Self Care Management Nursing Diagnoses: Potential for falls Goals: Patient will not experience any injury related to falls Date Initiated: 08/27/2019 Target Resolution Date: 11/20/2019 Goal Status: Active Interventions: Assess fall risk on admission and as needed Notes: Orientation to the Wound Care Program Nursing Diagnoses: Knowledge deficit related to the wound healing center program Goals: Patient/caregiver will verbalize understanding of the [facility] Program Date Initiated: 08/27/2019 Target Resolution Date: 11/20/2019 Goal Status: Active Interventions: Provide education on orientation to the wound center Notes: Venous Leg Ulcer Nursing Diagnoses: Potential for venous Insuffiency (use before diagnosis confirmed) Goals: Patient will maintain optimal edema control Date Initiated: 08/27/2019 Target Resolution Date: 11/20/2019 Goal Status: Active Interventions: Assess peripheral edema status every visit. Notes: Wound/Skin Impairment Nursing  Diagnoses: Impaired tissue integrity Goals: Ulcer/skin breakdown will heal within 14 weeks Date Initiated: 08/27/2019 Target Resolution Date: 11/20/2019 Goal Status: Active [name], [name] ([id number]) Interventions: Assess patient/caregiver ability to obtain necessary supplies Assess patient/caregiver ability to perform ulcer/skin care regimen upon admission and as needed Assess ulceration(s) every visit Notes: Electronic Signature(s) Signed: 01/12/2020 5:22:43 PM By: [name], BSN, RN, CWS, Kim RN, BSN Entered By: [name], BSN, RN, CWS, [name] on 01/12/2020 15:28:31 Coppens, [name]  ([id number]) -------------------------------------------------------------------------------- Pain Assessment Details Patient Name: [name], [name] Date of Service: 01/12/2020 3:00 PM Medical Record Number: [id number] Patient Account Number: [id number] Date of Birth/Sex: [birth year] (64 y.o. F) Treating RN: [name] Primary Care Eliyohu Class: [name] Other Clinician: Referring Aeriel Boulay: [name] Treating Heath Tesler/Extender: [name] in Treatment: 67 Active Problems Location of Pain Severity and Description of Pain Patient Has Paino No Site Locations Pain Management and Medication Current Pain Management: Electronic Signature(s) Signed: 01/12/2020 4:11:09 PM By: [name] Entered By: [name] on 01/12/2020 15:15:47 Sime, [name] ([id number]) -------------------------------------------------------------------------------- Patient/Caregiver Education Details Patient Name: Martha Siren. Date of Service: 01/12/2020 3:00 PM Medical Record Number: [id number] Patient Account Number: [id number] Date of Birth/Gender: Mar 19, 1956 (64 y.o. F) Treating RN: [name] Primary Care Physician: [name] Other Clinician: Referring Physician: Neta [name] Treating Physician/Extender: [name] in Treatment: 32 Education Assessment Education Provided To: Patient Education Topics Provided Venous: Handouts: Controlling Swelling with Compression Stockings Methods: Demonstration, Explain/Verbal Responses: State content correctly Wound/Skin Impairment: Handouts: Caring for Your Ulcer Methods: Demonstration, Explain/Verbal Responses: State content correctly Electronic Signature(s) Signed: 01/12/2020 5:22:43 PM By: [name], BSN, RN, CWS, Kim RN, BSN Entered By: [name], BSN, RN, CWS, [name] on 01/12/2020 15:33:04 [place], [name] ([id number]) -------------------------------------------------------------------------------- Wound Assessment  Details Patient Name: JALECIA, [name] Date of Service: 01/12/2020 3:00 PM Medical Record Number: [id number] Patient Account Number: [id number] Date of Birth/Sex: 09/09/1956 (64 y.o. F) Treating RN: [name] Primary Care Sayge Salvato: [name] Other Clinician: Referring Scott Fix: [name] Treating Kross Swallows/Extender: [name] in Treatment: 19 Wound Status Wound Number: 1 Primary Etiology: Lymphedema Wound Location: Left, Medial, Anterior Lower Leg Wound Status: Open Wounding Event: Gradually Appeared Comorbid History: Anemia, Coronary Artery Disease, Hypertension Date Acquired: 08/04/2019 Weeks Of Treatment: 19 Clustered Wound: No Photos Wound Measurements Length: (cm) 3.3 Width: (cm) 1.5 Depth: (cm) 0.1 Area: (cm) 3.888 Volume: (cm) 0.389 % Reduction in Area: 99.6% % Reduction in Volume: 99.6% Epithelialization: Small (1-33%) Wound Description Classification: Full Thickness Without Exposed Support Struc Wound Margin: Flat and Intact Exudate Amount: Medium Exudate Type: Serosanguineous Exudate Color: red, brown tures Foul Odor After Cleansing: No Slough/Fibrino Yes Wound Bed Granulation Amount: None Present (0%) Exposed Structure Necrotic Amount: Large (67-100%) Fascia Exposed: No Necrotic Quality: Eschar Fat Layer (Subcutaneous Tissue) Exposed: Yes Tendon Exposed: No Muscle Exposed: No Joint Exposed: No Bone Exposed: No Treatment Notes Wound #1 (Left, Medial, Anterior Lower Leg) Notes xsorb, 4 layer bilateral Electronic Signature(s) Signed: 01/12/2020 5:22:43 PM By: [name], BSN, RN, CWS, Kim RN, BSN [name], [name] Entered By: [name], BSN, RN, CWS, [name] on 01/12/2020 15:27:51 Plake, [name] ([id number]) -------------------------------------------------------------------------------- Wound Assessment Details Patient Name: [name], [name] Date of Service: 01/12/2020 3:00 PM Medical Record Number: [id number] Patient Account  Number: [id number] Date of Birth/Sex: [birth year] (64 y.o. F) Treating RN: [name] Primary Care Reeve Mallo: [name] Other Clinician: Referring Stylianos Stradling: [name] Treating Kayston Jodoin/Extender: [name] in Treatment: 33  Wound Status Wound Number: 5 Primary Etiology: Lymphedema Wound Location: Left, Proximal Lower Leg Wound Status: Healed - Epithelialized Wounding Event: Gradually Appeared Comorbid History: Anemia, Coronary Artery Disease, Hypertension Date Acquired: 10/21/2019 Weeks Of Treatment: 10 Clustered Wound: No Photos Wound Measurements Length: (cm) 0 Width: (cm) 0 Depth: (cm) 0 Area: (cm) Volume: (cm) % Reduction in Area: 100% % Reduction in Volume: 100% Epithelialization: None 0 Tunneling: No 0 Undermining: No Wound Description Classification: Full Thickness Without Exposed Support Structu Exudate Amount: Medium Exudate Type: Serosanguineous Exudate Color: red, brown res Foul Odor After Cleansing: No Slough/Fibrino Yes Wound Bed Granulation Amount: Large (67-100%) Exposed Structure Granulation Quality: Red Fascia Exposed: No Necrotic Amount: Small (1-33%) Fat Layer (Subcutaneous Tissue) Exposed: Yes Necrotic Quality: Adherent Slough Tendon Exposed: No Muscle Exposed: No Joint Exposed: No Bone Exposed: No Electronic Signature(s) Signed: 01/12/2020 5:22:43 PM By: [name], BSN, RN, CWS, Kim RN, BSN Entered By: [name], BSN, RN, CWS, [name] on 01/12/2020 15:28:14 [name], [name] ([id number]) -------------------------------------------------------------------------------- [place] Details Patient Name: Martha Downs L. Date of Service: 01/12/2020 3:00 PM Medical Record Number: [id number] Patient Account Number: [id number] Date of Birth/Sex: [birth year] (64 y.o. F) Treating RN: [name] Primary Care Davon Abdelaziz: [name] Other Clinician: Referring Tahesha Skeet: [name] Treating Amour Trigg/Extender: [name] in  Treatment: 19 Vital Signs Time Taken: 15:10 Temperature (F): 97.7 Height (in): 62 Pulse (bpm): 80 Weight (lbs): 290 Respiratory Rate (breaths/min): 20 Body Mass Index (BMI): 53 Blood Pressure (mmHg): 128/59 Reference Range: 80 - 120 mg / dl Electronic Signature(s) Signed: 01/12/2020 4:20:30 PM By: [name] RCP, RRT, CHT Entered By: [name] on 01/12/2020 15:12:27

## 2020-01-13 NOTE — Progress Notes (Signed)
[name], [name] ([id number]) Visit Report for 01/12/2020 HPI Details Patient Name: [name], [name]. Date of Service: 01/12/2020 3:00 PM Medical Record Number: [id number] Patient Account Number: [id number] Date of Birth/Sex: 11-17-1955 (64 y.o. F) Treating RN: Cornell Barman Primary Care Provider: Neta Ehlers Other Clinician: Referring Provider: Neta Ehlers Treating Provider/Extender: Tito Dine in Treatment: 19 History of Present Illness HPI Description: 08/27/2019 patient presents today for initial evaluation here in our clinic concerning issues that she has been having with her bilateral lower extremities with lymphedema and weeping. She is previously been a patient over the past year of Dr. Jerilee Hoh at the wound care center in [facility]. With that being said she subsequently was recommended by the lymphedema clinic here in [place] to follow-up with [place] here instead of going back to [place] since they were in closer contact with the clinic here than they are obviously. With that being said the patient states that is why she came her way in order to be evaluated at this point. Fortunately there is no signs of active infection at this time. No fevers, chills, nausea, vomiting, or diarrhea. The patient does have a history of lymphedema, hypertension, long-term use of anticoagulant therapy, and obstructive sleep apnea. During the visit today and from checking into the discharge she was prone to unexpectedly falling asleep. I did discuss this with her daughter as well who states that this is very common for her she states she is not really sure how compliant her mother is with her CPAP machine that may be part of the issue. The patient did have ABIs performed which revealed have copies of his well today that was on 05/01/2019 and it showed a right ABI of 1.17 with a TBI of 1.12 and a left ABI of 1.24 with a TBI of 0.96. She has had 2 transfusions in the past 2 weeks  apparently as well. 08/31/2019 upon evaluation today patient actually appears to be doing somewhat better in regard to her lower extremity ulcers. She still has a tremendous amount of drainage from the left lower extremity unfortunately but her right lower extremity is doing much better which is great news. Overall I am very pleased in this regard. With regard to home health we finally got things settled so home health will be coming out to see her this would be very beneficial in my opinion as I think they will be able to help her tremendously with more frequent dressing changes. 12/28-Patient is back here at 1 week visit, with regard to her bilateral lower extremity ulcers, she has a significant degree of lymphedema, significant degree of drainage more from the left and which continues to be an issue, patient had not been using her lymphedema pumps as recommended, she is concerned about amount of drainage and how quickly it soaks the dressing, she is concerned about the lymphedema pump sticking to her skin and was told to use it on the wraps. She will get in touch with her cardiologist about adjusting her Lasix dose. 09/14/2019 on evaluation today patient appears to be doing poorly in regard to her lower extremities especially left lower extremity. Unfortunately she is continue to have a significant amount of drainage which is preventing her from being able to heal keeping the wounds very macerated. She also has some warmth to touch in the anterior to medial portion of the left lower extremity compared to the remainder of the wound which does have made somewhat concerned about the  possibility of cellulitis. Obviously it can be difficult to determine just strictly lymphedema versus cellulitis but right now I am somewhat concerned. She tells me that she can put a wrap on and at least has to change this twice a day or else she will be walking around leaving puddles of water behind her. Obviously this is  fairly significant. She did see her primary care provider they did not see anything going on that they felt needed to be addressed from a diuretic standpoint but nonetheless I am still somewhat concerned based on what we are seeing to be honest. The patient and her husband are likewise concerned. 09/17/2019 patient is seen today in follow-up I last saw her on Tuesday at that point based on the severity of her lower extremity edema, weeping, and erythema I did send her to the ER for further evaluation and treatment. She subsequently did go for that evaluation. It did appear that based on my review of the notes this is dated 09/14/2019 that the patient did have a BNP of 149.4. Which was notable. Subsequently she was given a dose of IV vancomycin as well as doxycycline and Keflex. It appears however that she was discharged on oral clindamycin 150 mg every 6 hours and to be honest compared to what I saw on Tuesday she actually seems to be doing somewhat better. Fortunately there is no evidence of active infection at this time which is good news. In the interim she did yesterday see her cardiologist as well Dr. Bettina Gavia. That was on 09/16/2019. Upon review of this note it does appear that his recommendation at this point was to increase the furosemide to 40 mg 2 times a day. He also recommend an increase of potassium 10 mg taken 2 times a day. His other recommendation was that the patient needs to have a follow-up with her hematology oncologist in order to see if they can do anything about iron transfusions for her as unfortunately her iron is still quite low and obviously oral supplementation is not going to be the best way to get this up. 09/24/2019 upon evaluation today patient appears to be doing quite well with regard to her lower extremities. The right lower extremity in particular has dramatically improved to be honest so has the left in my opinion. There does not appear to be any signs of active infection  which is good news. No fevers, chills, nausea, vomiting, or diarrhea. Patient is still taking the clindamycin at this time. 10/01/2019 on evaluation today patient appears to be showing some signs of improvement at this point in regard to her lower extremities. The right lower extremity is doing quite well the left lower extremity is still draining but again I do believe this is significantly better compared to what it was in the past. Fortunately there is no evidence of active infection at this time. No fever chills noted 10/08/2019 upon evaluation today patient appears to have smaller areas of erythema and weeping on the left lower extremity as compared to last week's evaluation. The right leg is doing excellent. With that being said unfortunately she is still having a lot of significant swelling on the left in fact the swelling is worse today than it was last week. With that being said she is telling me that she is able to keep her wraps on now for at least the day sometimes even up to 30 hours before they are having to be changed. When they do have to change it they  are using her lymphedema wraps at home to reapply what they can. She only changes that she tells me when it actually gets to the point that is draining so much that it leaks onto her floor. 10/15/2019 upon evaluation today patient appears to actually be doing very well with regard to her lower extremities in fact I believe the right lower extremity may be completely sealed up that we will likely watch it 1 more week. The left lower extremity is doing better she does not seem to be having as much weeping and leaking but still this is going on. Home health still has not gotten the [place] there is still just using ABD pads I believe was able to get Lauraine Rinne this would actually help out. [name], [name] ([id number]) 10/21/2019 upon evaluation today patient appears to be doing much better in regard to her lower extremities bilaterally. The  right lower extremity has 1 area remaining that still draining the left lower extremity is still draining but not nearly what it was in the past. Overall I am extremely pleased with how things seem to be progressing. 10/29/2019 upon evaluation today patient actually appears to be doing quite well in regard to her lower extremities bilaterally both extremities show signs of improvement. The biggest issue we seem to be having is that unfortunately the home health nurse does not seem to be wrapping this quite right in that it seems to slide down within just a few hours of them coming out to rewrap her according to the patient and her husband. The wraps we put on the state last much longer in fact generally until they are taken off. Fortunately there is no signs of infection although unfortunately she does still have a few open areas on both lower extremities at this time. 11/12/2019 upon evaluation today patient appears to be doing excellent in regard to her wounds. She does not have weeping like she is had in the past and overall I feel like she is doing quite well. With that being said this is something that has continued to give her trouble and that she states that the wrap is sliding down especially when home health puts this on. She tells me that the ones we put on do not seem to be sliding down at this point. Nonetheless I think that this is definitely something of concern. Obviously if it slides down can cause trouble with getting the area to effectively heal and continue to do well. 11/19/2019 upon evaluation today patient actually appears to be doing very well with regard to her bilateral lower extremities. She just has a couple small areas 1 on the right 2 on the left that are still open at this point and all seem to be headed in the right direction. She is no longer saturating her wraps which is also good news. Overall I am extremely pleased with the progress that is been made up to this point. I  do believe we may be able to switch into her lymphedema wraps at this point. 12/09/2019 unfortunately the patient appears to be doing more poorly this week in regard to her legs. We actually transitioned her to her lymphedema wraps last week although I do not believe they are controlling her edema well enough. She has areas that have reopened on the right and just a very small weeping region in fact there is not even really a wound here. On the left she has wounds that have reopened and appear to be doing  worse she is weeping quite significantly. I think Lauraine Rinne is good to be the ideal thing here I do not even think we can use the alginate as I am afraid that we just become too moist at this point. Fortunately there is no signs of active infection at this time. She is on doxycycline for completely separate issue dealing with a uterine fibroid. 12/17/2019 upon evaluation today patient actually appears to be doing excellent in regard to her wounds currently of the left lower extremity. These are all measuring better and looking well. The right lower extremity is completely sealed up and doing excellent. Overall very pleased with how things seem to be progressing. Her swelling is also down from last week. 12/23/2019 upon evaluation today patient's legs actually appear to be very dry compared to normal this is actually a good thing for her as unfortunately when she starts to swell too much she tends to weep more which leads to greater issues. Fortunately there is no signs of active infection at this time which is excellent. Overall I am very pleased with how things seem to be progressing. The only real issue we had is that unfortunately her wrap started to slide down apparently we did use the [place] paste to anchor last time I think that something we can definitely do. 01/06/20 upon evaluation today patient appears to be doing decently well in regard to her lower extremities. The right lower extremity appears  to be completely healed which is great news. The left lower extremity unfortunately is still weeping or rather leaking. The right is not. With that being said there is no signs of active infection at this time which is great news. No fevers, chills, nausea, vomiting, or diarrhea. 5/5; patient I do not usually see however apparently our schedule was booked for tomorrow. She does not appear to have a wound on the right leg however she has 1 remaining wound on the left anterior. She tells that she took her 4-layer compression off on Monday night because there was too much pruritus in her bilateral lower legs. Since then they have been using lymphedema wraps that her husband applies. She has compression pumps at home for 1 reason or another I do not think she is using them Electronic Signature(s) Signed: 01/12/2020 4:28:02 PM By: Linton Ham MD Entered By: Linton Ham on 01/12/2020 15:52:59 [place], [name] ([id number]) -------------------------------------------------------------------------------- Physical Exam Details Patient Name: [name], [name] Date of Service: 01/12/2020 3:00 PM Medical Record Number: [id number] Patient Account Number: [id number] Date of Birth/Sex: [birth year] (64 y.o. F) Treating RN: Cornell Barman Primary Care Provider: Neta Ehlers Other Clinician: Referring Provider: Neta Ehlers Treating Provider/Extender: Tito Dine in Treatment: 19 Constitutional Sitting or standing Blood Pressure is within target range for patient.. Pulse regular and within target range for patient.Marland Kitchen Respirations regular, non- labored and within target range.. Temperature is normal and within the target range for the patient.Marland Kitchen appears in no distress. Notes Wound exam; only 1 small wound on the left anterior lower leg is all I can see on the her bilateral legs. The amount of swelling in the left lower leg is under very poor control she has chronic stasis dermatitis inflamed skin  but no tenderness no evidence of cellulitis the edema is nonpitting. Her pedal pulses are palpable Electronic Signature(s) Signed: 01/12/2020 4:28:02 PM By: Linton Ham MD Entered By: Linton Ham on 01/12/2020 15:53:53 [place], [name] ([id number]) -------------------------------------------------------------------------------- Physician Orders Details Patient Name: [name]. Date of  Service: 01/12/2020 3:00 PM Medical Record Number: [id number] Patient Account Number: [id number] Date of Birth/Sex: [birth year] (64 y.o. F) Treating RN: Cornell Barman Primary Care Provider: Neta Ehlers Other Clinician: Referring Provider: Neta Ehlers Treating Provider/Extender: Tito Dine in Treatment: 36 Verbal / Phone Orders: No Diagnosis Coding Wound Cleansing Wound #1 Left,Medial,Anterior Lower Leg o Cleanse wound with mild soap and water o Dial antibacterial soap, wash wounds, rinse and pat dry prior to dressing wounds o May Shower, gently pat wound dry prior to applying new dressing. Skin Barriers/Peri-Wound Care Wound #1 Left,Medial,Anterior Lower Leg o Triamcinolone Acetonide Ointment (TCA) - to either leg on places that itch - ask patient Secondary Dressing Wound #1 Left,Medial,Anterior Lower Leg o XtraSorb Dressing Change Frequency Wound #1 Left,Medial,Anterior Lower Leg o Change dressing every week - Nurse visit as needed Follow-up Appointments Wound #1 Left,Medial,Anterior Lower Leg o Return Appointment in 1 week. Edema Control Wound #1 Left,Medial,Anterior Lower Leg o 4 Layer Compression System - Bilateral o Elevate legs to the level of the heart and pump ankles as often as possible o Compression Pump: Use compression pump on left lower extremity for 60 minutes, twice daily. o Compression Pump: Use compression pump on right lower extremity for 60 minutes, twice daily. Electronic Signature(s) Signed: 01/12/2020 4:28:02 PM By: Linton Ham MD Signed: 01/12/2020 5:22:43 PM By: Gretta Cool, BSN, RN, CWS, Kim RN, BSN Entered By: Gretta Cool, BSN, RN, CWS, Kim on 01/12/2020 15:30:29 [place], [name] ([id number]) -------------------------------------------------------------------------------- Problem List Details Patient Name: [name], [name]. Date of Service: 01/12/2020 3:00 PM Medical Record Number: [id number] Patient Account Number: [id number] Date of Birth/Sex: [birth year] (64 y.o. F) Treating RN: Cornell Barman Primary Care Provider: Neta Ehlers Other Clinician: Referring Provider: Neta Ehlers Treating Provider/Extender: Tito Dine in Treatment: 70 Active Problems ICD-10 Encounter Code Description Active Date MDM Diagnosis I89.0 Lymphedema, not elsewhere classified 08/27/2019 No Yes L97.812 Non-pressure chronic ulcer of other part of right lower leg with fat layer 08/27/2019 No Yes exposed L97.822 Non-pressure chronic ulcer of other part of left lower leg with fat layer 08/27/2019 No Yes exposed [place] (primary) hypertension 08/27/2019 No Yes Z79.01 Long term (current) use of anticoagulants 08/27/2019 No Yes Inactive Problems Resolved Problems Electronic Signature(s) Signed: 01/12/2020 4:28:02 PM By: Linton Ham MD Entered By: Linton Ham on 01/12/2020 15:51:36 [place], [place] ([id number]) -------------------------------------------------------------------------------- Progress Note Details Patient Name: Martha Downs L. Date of Service: 01/12/2020 3:00 PM Medical Record Number: [id number] Patient Account Number: [id number] Date of Birth/Sex: 12-18-1955 (64 y.o. F) Treating RN: Cornell Barman Primary Care Provider: Neta Ehlers Other Clinician: Referring Provider: Neta Ehlers Treating Provider/Extender: Tito Dine in Treatment: 19 Subjective History of Present Illness (HPI) 08/27/2019 patient presents today for initial evaluation here in our clinic concerning issues that she  has been having with her bilateral lower extremities with lymphedema and weeping. She is previously been a patient over the past year of Dr. Jerilee Hoh at the wound care center in [facility] With that being said she subsequently was recommended by the lymphedema clinic here in [place] to follow-up with [place] here instead of going back to [place] since they were in closer contact with the clinic here than they are obviously. With that being said the patient states that is why she came her way in order to be evaluated at this point. Fortunately there is no signs of active infection at this time. No fevers, chills, nausea, vomiting, or diarrhea. The patient  does have a history of lymphedema, hypertension, long-term use of anticoagulant therapy, and obstructive sleep apnea. During the visit today and from checking into the discharge she was prone to unexpectedly falling asleep. I did discuss this with her daughter as well who states that this is very common for her she states she is not really sure how compliant her mother is with her CPAP machine that may be part of the issue. The patient did have ABIs performed which revealed have copies of his well today that was on 05/01/2019 and it showed a right ABI of 1.17 with a TBI of 1.12 and a left ABI of 1.24 with a TBI of 0.96. She has had 2 transfusions in the past 2 weeks apparently as well. 08/31/2019 upon evaluation today patient actually appears to be doing somewhat better in regard to her lower extremity ulcers. She still has a tremendous amount of drainage from the left lower extremity unfortunately but her right lower extremity is doing much better which is great news. Overall I am very pleased in this regard. With regard to home health we finally got things settled so home health will be coming out to see her this would be very beneficial in my opinion as I think they will be able to help her tremendously with more frequent dressing  changes. 12/28-Patient is back here at 1 week visit, with regard to her bilateral lower extremity ulcers, she has a significant degree of lymphedema, significant degree of drainage more from the left and which continues to be an issue, patient had not been using her lymphedema pumps as recommended, she is concerned about amount of drainage and how quickly it soaks the dressing, she is concerned about the lymphedema pump sticking to her skin and was told to use it on the wraps. She will get in touch with her cardiologist about adjusting her Lasix dose. 09/14/2019 on evaluation today patient appears to be doing poorly in regard to her lower extremities especially left lower extremity. Unfortunately she is continue to have a significant amount of drainage which is preventing her from being able to heal keeping the wounds very macerated. She also has some warmth to touch in the anterior to medial portion of the left lower extremity compared to the remainder of the wound which does have made somewhat concerned about the possibility of cellulitis. Obviously it can be difficult to determine just strictly lymphedema versus cellulitis but right now I am somewhat concerned. She tells me that she can put a wrap on and at least has to change this twice a day or else she will be walking around leaving puddles of water behind her. Obviously this is fairly significant. She did see her primary care provider they did not see anything going on that they felt needed to be addressed from a diuretic standpoint but nonetheless I am still somewhat concerned based on what we are seeing to be honest. The patient and her husband are likewise concerned. 09/17/2019 patient is seen today in follow-up I last saw her on Tuesday at that point based on the severity of her lower extremity edema, weeping, and erythema I did send her to the ER for further evaluation and treatment. She subsequently did go for that evaluation. It did appear  that based on my review of the notes this is dated 09/14/2019 that the patient did have a BNP of 149.4. Which was notable. Subsequently she was given a dose of IV vancomycin as well as doxycycline and Keflex.  It appears however that she was discharged on oral clindamycin 150 mg every 6 hours and to be honest compared to what I saw on Tuesday she actually seems to be doing somewhat better. Fortunately there is no evidence of active infection at this time which is good news. In the interim she did yesterday see her cardiologist as well Dr. Bettina Gavia. That was on 09/16/2019. Upon review of this note it does appear that his recommendation at this point was to increase the furosemide to 40 mg 2 times a day. He also recommend an increase of potassium 10 mg taken 2 times a day. His other recommendation was that the patient needs to have a follow-up with her hematology oncologist in order to see if they can do anything about iron transfusions for her as unfortunately her iron is still quite low and obviously oral supplementation is not going to be the best way to get this up. 09/24/2019 upon evaluation today patient appears to be doing quite well with regard to her lower extremities. The right lower extremity in particular has dramatically improved to be honest so has the left in my opinion. There does not appear to be any signs of active infection which is good news. No fevers, chills, nausea, vomiting, or diarrhea. Patient is still taking the clindamycin at this time. 10/01/2019 on evaluation today patient appears to be showing some signs of improvement at this point in regard to her lower extremities. The right lower extremity is doing quite well the left lower extremity is still draining but again I do believe this is significantly better compared to what it was in the past. Fortunately there is no evidence of active infection at this time. No fever chills noted 10/08/2019 upon evaluation today patient appears to  have smaller areas of erythema and weeping on the left lower extremity as compared to last week's evaluation. The right leg is doing excellent. With that being said unfortunately she is still having a lot of significant swelling on the left in fact the swelling is worse today than it was last week. With that being said she is telling me that she is able to keep her wraps on now for at least the day sometimes even up to 30 hours before they are having to be changed. When they do have to change it they are using her lymphedema wraps at home to reapply what they can. She only changes that she tells me when it actually gets to the point that is draining so much that it leaks onto her floor. 10/15/2019 upon evaluation today patient appears to actually be doing very well with regard to her lower extremities in fact I believe the right lower extremity may be completely sealed up that we will likely watch it 1 more week. The left lower extremity is doing better she does not seem to be having as much weeping and leaking but still this is going on. Home health still has not gotten the [place] there is still just using ABD pads I believe was able to get Lauraine Rinne this would actually help out. 10/21/2019 upon evaluation today patient appears to be doing much better in regard to her lower extremities bilaterally. The right lower extremity has 1 area remaining that still draining the left lower extremity is still draining but not nearly what it was in the past. Overall I am extremely pleased with how things seem to be progressing. [name], [name] ([id number]) 10/29/2019 upon evaluation today patient actually appears to be  doing quite well in regard to her lower extremities bilaterally both extremities show signs of improvement. The biggest issue we seem to be having is that unfortunately the home health nurse does not seem to be wrapping this quite right in that it seems to slide down within just a few hours of them  coming out to rewrap her according to the patient and her husband. The wraps we put on the state last much longer in fact generally until they are taken off. Fortunately there is no signs of infection although unfortunately she does still have a few open areas on both lower extremities at this time. 11/12/2019 upon evaluation today patient appears to be doing excellent in regard to her wounds. She does not have weeping like she is had in the past and overall I feel like she is doing quite well. With that being said this is something that has continued to give her trouble and that she states that the wrap is sliding down especially when home health puts this on. She tells me that the ones we put on do not seem to be sliding down at this point. Nonetheless I think that this is definitely something of concern. Obviously if it slides down can cause trouble with getting the area to effectively heal and continue to do well. 11/19/2019 upon evaluation today patient actually appears to be doing very well with regard to her bilateral lower extremities. She just has a couple small areas 1 on the right 2 on the left that are still open at this point and all seem to be headed in the right direction. She is no longer saturating her wraps which is also good news. Overall I am extremely pleased with the progress that is been made up to this point. I do believe we may be able to switch into her lymphedema wraps at this point. 12/09/2019 unfortunately the patient appears to be doing more poorly this week in regard to her legs. We actually transitioned her to her lymphedema wraps last week although I do not believe they are controlling her edema well enough. She has areas that have reopened on the right and just a very small weeping region in fact there is not even really a wound here. On the left she has wounds that have reopened and appear to be doing worse she is weeping quite significantly. I think Lauraine Rinne is good to  be the ideal thing here I do not even think we can use the alginate as I am afraid that we just become too moist at this point. Fortunately there is no signs of active infection at this time. She is on doxycycline for completely separate issue dealing with a uterine fibroid. 12/17/2019 upon evaluation today patient actually appears to be doing excellent in regard to her wounds currently of the left lower extremity. These are all measuring better and looking well. The right lower extremity is completely sealed up and doing excellent. Overall very pleased with how things seem to be progressing. Her swelling is also down from last week. 12/23/2019 upon evaluation today patient's legs actually appear to be very dry compared to normal this is actually a good thing for her as unfortunately when she starts to swell too much she tends to weep more which leads to greater issues. Fortunately there is no signs of active infection at this time which is excellent. Overall I am very pleased with how things seem to be progressing. The only real issue we had is that  unfortunately her wrap started to slide down apparently we did use the Unna paste to anchor last time I think that something we can definitely do. 01/06/20 upon evaluation today patient appears to be doing decently well in regard to her lower extremities. The right lower extremity appears to be completely healed which is great news. The left lower extremity unfortunately is still weeping or rather leaking. The right is not. With that being said there is no signs of active infection at this time which is great news. No fevers, chills, nausea, vomiting, or diarrhea. 5/5; patient I do not usually see however apparently our schedule was booked for tomorrow. She does not appear to have a wound on the right leg however she has 1 remaining wound on the left anterior. She tells that she took her 4-layer compression off on Monday night because there was too much  pruritus in her bilateral lower legs. Since then they have been using lymphedema wraps that her husband applies. She has compression pumps at home for 1 reason or another I do not think she is using them Objective Constitutional Sitting or standing Blood Pressure is within target range for patient.. Pulse regular and within target range for patient.Marland Kitchen Respirations regular, non- labored and within target range.. Temperature is normal and within the target range for the patient.Marland Kitchen appears in no distress. Vitals Time Taken: 3:10 PM, Height: 62 in, Weight: 290 lbs, BMI: 53, Temperature: 97.7 F, Pulse: 80 bpm, Respiratory Rate: 20 breaths/min, Blood Pressure: 128/59 mmHg. General Notes: Wound exam; only 1 small wound on the left anterior lower leg is all I can see on the her bilateral legs. The amount of swelling in the left lower leg is under very poor control she has chronic stasis dermatitis inflamed skin but no tenderness no evidence of cellulitis the edema is nonpitting. Her pedal pulses are palpable Integumentary (Hair, Skin) Wound #1 status is Open. Original cause of wound was Gradually Appeared. The wound is located on the Left,Medial,Anterior Lower Leg. The wound measures 3.3cm length x 1.5cm width x 0.1cm depth; 3.888cm^2 area and 0.389cm^3 volume. There is Fat Layer (Subcutaneous Tissue) Exposed exposed. There is a medium amount of serosanguineous drainage noted. The wound margin is flat and intact. There is no granulation within the wound bed. There is a large (67-100%) amount of necrotic tissue within the wound bed including Eschar. Wound #5 status is Healed - Epithelialized. Original cause of wound was Gradually Appeared. The wound is located on the Left,Proximal Lower Leg. The wound measures 0cm length x 0cm width x 0cm depth; 0cm^2 area and 0cm^3 volume. There is Fat Layer (Subcutaneous Tissue) Exposed exposed. There is no tunneling or undermining noted. There is a medium amount of  serosanguineous drainage noted. There is large (67- 100%) red granulation within the wound bed. There is a small (1-33%) amount of necrotic tissue within the wound bed including Adherent Slough. [name], [name] ([id number]) Assessment Active Problems ICD-10 Lymphedema, not elsewhere classified Non-pressure chronic ulcer of other part of right lower leg with fat layer exposed Non-pressure chronic ulcer of other part of left lower leg with fat layer exposed Essential (primary) hypertension Long term (current) use of anticoagulants Procedures Wound #1 Pre-procedure diagnosis of Wound #1 is a Lymphedema located on the Left,Medial,Anterior Lower Leg . There was a Four Layer Compression Therapy Procedure by Cornell Barman, RN. Post procedure Diagnosis Wound #1: Same as Pre-Procedure Notes: bilateral legs. There was a Four Layer Compression Therapy Procedure by Cornell Barman, RN.  Post procedure Diagnosis Wound #: Same as Pre-Procedure Plan Wound Cleansing: Wound #1 Left,Medial,Anterior Lower Leg: Cleanse wound with mild soap and water Dial antibacterial soap, wash wounds, rinse and pat dry prior to dressing wounds May Shower, gently pat wound dry prior to applying new dressing. Skin Barriers/Peri-Wound Care: Wound #1 Left,Medial,Anterior Lower Leg: Triamcinolone Acetonide Ointment (TCA) - to either leg on places that itch - ask patient Secondary Dressing: Wound #1 Left,Medial,Anterior Lower Leg: XtraSorb Dressing Change Frequency: Wound #1 Left,Medial,Anterior Lower Leg: Change dressing every week - Nurse visit as needed Follow-up Appointments: Wound #1 Left,Medial,Anterior Lower Leg: Return Appointment in 1 week. Edema Control: Wound #1 Left,Medial,Anterior Lower Leg: 4 Layer Compression System - Bilateral Elevate legs to the level of the heart and pump ankles as often as possible Compression Pump: Use compression pump on left lower extremity for 60 minutes, twice daily. Compression  Pump: Use compression pump on right lower extremity for 60 minutes, twice daily. 1. Continue with triamcinolone 2. Placing Xtrasorb on the area on the left both legs under 4-layer compression 3. I have asked her to use her compression pumps at least once a day maybe twice daily if she is so inclined. 4. I told her that if she does not use these pumps the skin on the left leg in particular [her larger leg] may breakdown into circumferential wounds. Her husband tells me that they have already been there with this 5. I am not clear whether they have workable compression stockings although they live in [name] and are aware about elastic therapy Electronic Signature(s) Signed: 01/12/2020 4:28:02 PM By: Linton Ham MD [place], [name] ([id number]) Entered By: Linton Ham on 01/12/2020 15:55:47 [place], [name] ([id number]) -------------------------------------------------------------------------------- SuperBill Details Patient Name: Martha Downs L. Date of Service: 01/12/2020 Medical Record Number: [id number] Patient Account Number: [id number] Date of Birth/Sex: 06/19/1956 (64 y.o. F) Treating RN: Cornell Barman Primary Care Provider: Neta Ehlers Other Clinician: Referring Provider: Neta Ehlers Treating Provider/Extender: Tito Dine in Treatment: 19 Diagnosis Coding ICD-10 Codes Code Description I89.0 Lymphedema, not elsewhere classified L97.812 Non-pressure chronic ulcer of other part of right lower leg with fat layer exposed L97.822 Non-pressure chronic ulcer of other part of left lower leg with fat layer exposed [name] (primary) hypertension Z79.01 Long term (current) use of anticoagulants Facility Procedures CPT4: Description Modifier Quantity Code [id number] BILATERAL: Application of multi-layer venous compression system; leg (below knee), including 1 ankle and foot. Physician Procedures CPT4 Code: [id number] Description: [id number] - WC PHYS LEVEL 3 - EST  PT Modifier: Quantity: 1 CPT4 Code: Description: ICD-10 Diagnosis Description [id number] Non-pressure chronic ulcer of other part of right lower leg with fat la L97.822 Non-pressure chronic ulcer of other part of left lower leg with fat lay Modifier: yer exposed er exposed Quantity: Electronic Signature(s) Signed: 01/12/2020 4:28:02 PM By: Linton Ham MD Entered By: Linton Ham on 01/12/2020 15:56:18

## 2020-01-20 ENCOUNTER — Encounter: Payer: Medicare Other | Admitting: Internal Medicine

## 2020-01-20 ENCOUNTER — Other Ambulatory Visit: Payer: Self-pay

## 2020-01-20 DIAGNOSIS — I890 Lymphedema, not elsewhere classified: Secondary | ICD-10-CM | POA: Diagnosis not present

## 2020-01-21 ENCOUNTER — Encounter (HOSPITAL_COMMUNITY): Payer: Medicare Other | Admitting: Cardiology

## 2020-01-21 NOTE — Progress Notes (Signed)
[name], [name] ([id number]) Visit Report for 01/20/2020 Arrival Information Details Patient Name: [name], [name]. Date of Service: 01/20/2020 3:00 PM Medical Record Number: [id number] Patient Account Number: [id number] Date of Birth/Sex: 12-06-1955 (64 y.o. F) Treating RN: Army Melia Primary Care Caedyn Tassinari: Neta Ehlers Other Clinician: Referring Alix Stowers: Neta Ehlers Treating Teka Chanda/Extender: Tito Dine in Treatment: 20 Visit Information History Since Last Visit Added or deleted any medications: No Patient Arrived: Ambulatory Any new allergies or adverse reactions: No Arrival Time: 15:25 Had a fall or experienced change in No Accompanied By: self activities of daily living that may affect Transfer Assistance: None risk of falls: Patient Identification Verified: Yes Signs or symptoms of abuse/neglect since last visito No Secondary Verification Process Completed: Yes Hospitalized since last visit: No Patient Has Alerts: Yes Implantable device outside of the clinic excluding No Patient Alerts: Patient on Blood Thinner cellular tissue based products placed in the center warfarin since last visit: Not Diabetic Has Dressing in Place as Prescribed: Yes 04/29/2019 [facility] Pain Present Now: No ABI R)1.17 L)1.24 TBI R) 1.12 L) 0.96 Electronic Signature(s) Signed: 01/20/2020 4:38:04 PM By: Lorine Bears RCP, RRT, CHT Entered By: Lorine Bears on 01/20/2020 15:26:10 Janik, [name] ([id number]) -------------------------------------------------------------------------------- Compression Therapy Details Patient Name: Martha Downs L. Date of Service: 01/20/2020 3:00 PM Medical Record Number: [id number] Patient Account Number: [id number] Date of Birth/Sex: [birth year] (64 y.o. F) Treating RN: Army Melia Primary Care Quency Tober: Neta Ehlers Other Clinician: Referring Isaly Fasching: Neta Ehlers Treating Jontavius Rabalais/Extender: Tito Dine in Treatment: 20 Compression Therapy Performed for Wound Assessment: Wound #1 Left,Medial,Anterior Lower Leg Performed By: Clinician Army Melia, RN Compression Type: Four Layer Post Procedure Diagnosis Same as Pre-procedure Electronic Signature(s) Signed: 01/20/2020 4:45:22 PM By: Army Melia Entered By: Army Melia on 01/20/2020 16:06:21 [place], [name] ([id number]) -------------------------------------------------------------------------------- Encounter Discharge Information Details Patient Name: Martha Downs L. Date of Service: 01/20/2020 3:00 PM Medical Record Number: [id number] Patient Account Number: [id number] Date of Birth/Sex: July 07, 1956 (64 y.o. F) Treating RN: Army Melia Primary Care Vasco Chong: Neta Ehlers Other Clinician: Referring Karem Farha: Neta Ehlers Treating [name]/Extender: Tito Dine in Treatment: 20 Encounter Discharge Information Items Post Procedure Vitals Discharge Condition: Stable Temperature (F): 99.4 Ambulatory Status: Ambulatory Pulse (bpm): 87 Discharge Destination: [name] Respiratory Rate (breaths/min): 16 Transportation: Private Auto Blood Pressure (mmHg): 141/59 Accompanied By: self Schedule Follow-up Appointment: Yes Clinical Summary of Care: Electronic Signature(s) Signed: 01/20/2020 4:45:22 PM By: Army Melia Entered By: Army Melia on 01/20/2020 16:12:19 Molzahn, [name] -------------------------------------------------------------------------------- Lower Extremity Assessment Details Patient Name: Martha Downs L. Date of Service: 01/20/2020 3:00 PM Medical Record Number: [id number] Patient Account Number: [id number] Date of Birth/Sex: 1956-02-19 (64 y.o. F) Treating RN: Montey Hora Primary Care Braedan Meuth: Neta Ehlers Other Clinician: Referring Nicholl Onstott: Neta Ehlers Treating Kamil Mchaffie/Extender: Tito Dine in Treatment: 20 Edema Assessment Assessed: [Left:  No] [Right: No] Edema: [Left: Yes] [Right: Yes] Calf Left: Right: Point of Measurement: 32 cm From Medial Instep 57 cm 50.5 cm Ankle Left: Right: Point of Measurement: 10 cm From Medial Instep 32 cm 29 cm Vascular Assessment Pulses: Dorsalis Pedis Palpable: [Left:Yes] [Right:Yes] Electronic Signature(s) Signed: 01/20/2020 5:03:46 PM By: Montey Hora Entered By: Montey Hora on 01/20/2020 15:36:59 [place], [name] ([id number]) -------------------------------------------------------------------------------- Multi Wound Chart Details Patient Name: Martha Downs L. Date of Service: 01/20/2020 3:00 PM Medical Record Number: [id number] Patient Account Number: [id number] Date of Birth/Sex: 1956-06-20 (64 y.o. F) Treating RN: Nicki Reaper,  [place] Primary Care Unknown Schleyer: Neta Ehlers Other Clinician: Referring Caitlin Hillmer: Neta Ehlers Treating Kailan Laws/Extender: Tito Dine in Treatment: 20 Vital Signs Height(in): 62 Pulse(bpm): 87 Weight(lbs): 290 Blood Pressure(mmHg): 141/59 Body Mass Index(BMI): 53 Temperature(F): 99.4 Respiratory Rate(breaths/min): 20 Photos: [N/A:N/A] Wound Location: Left, Medial, Anterior Lower Leg N/A N/A Wounding Event: Gradually Appeared N/A N/A Primary Etiology: Lymphedema N/A N/A Comorbid History: Anemia, Coronary Artery Disease, N/A N/A Hypertension Date Acquired: 08/04/2019 N/A N/A Weeks of Treatment: 20 N/A N/A Wound Status: Open N/A N/A Measurements L x W x D (cm) 0.4x0.7x0.1 N/A N/A Area (cm) : 0.22 N/A N/A Volume (cm) : 0.022 N/A N/A % Reduction in Area: 100.00% N/A N/A % Reduction in Volume: 100.00% N/A N/A Classification: Full Thickness Without Exposed N/A N/A Support Structures Exudate Amount: Medium N/A N/A Exudate Type: Serosanguineous N/A N/A Exudate Color: red, brown N/A N/A Wound Margin: Flat and Intact N/A N/A Granulation Amount: Large (67-100%) N/A N/A Granulation Quality: Pink N/A N/A Necrotic Amount: None Present  (0%) N/A N/A Exposed Structures: Fat Layer (Subcutaneous Tissue) N/A N/A Exposed: Yes Fascia: No Tendon: No Muscle: No Joint: No Bone: No Epithelialization: Medium (34-66%) N/A N/A Debridement: Chemical/Enzymatic/Mechanical N/A N/A Pre-procedure Verification/Time 16:05 N/A N/A Out Taken: Instrument: Other(saline and gauze) N/A N/A Bleeding: None N/A N/A Debridement Treatment Procedure was tolerated well N/A N/A Response: Post Debridement 0.4x0.7x0.1 N/A N/A Measurements L x W x D (cm) Post Debridement Volume: 0.022 N/A N/A (cm) Procedures Performed: Compression Therapy N/A N/A Debridement Fair, Meklit L. ([id number]) Treatment Notes Wound #1 (Left, Medial, Anterior Lower Leg) Notes xsorb, 4 layer bilateral Electronic Signature(s) Signed: 01/20/2020 5:28:47 PM By: Linton Ham MD Previous Signature: 01/20/2020 4:45:22 PM Version By: Army Melia Entered By: Linton Ham on 01/20/2020 16:54:40 [place], [name] ([id number]) -------------------------------------------------------------------------------- [place] Details Patient Name: [name], [name]. Date of Service: 01/20/2020 3:00 PM Medical Record Number: [id number] Patient Account Number: [id number] Date of Birth/Sex: 02/14/1956 (64 y.o. F) Treating RN: Army Melia Primary Care Zeplin Aleshire: Neta Ehlers Other Clinician: Referring Breydan Shilling[place]: Neta Ehlers Treating Jaimey Franchini/Extender: Tito Dine in Treatment: 20 Active Inactive Abuse / Safety / Falls / Self Care Management Nursing Diagnoses: Potential for falls Goals: Patient will not experience any injury related to falls Date Initiated: 08/27/2019 Target Resolution Date: 11/20/2019 Goal Status: Active Interventions: Assess fall risk on admission and as needed Notes: Orientation to the Wound Care Program Nursing Diagnoses: Knowledge deficit related to the wound healing center program Goals: Patient/caregiver will verbalize  understanding of the [place] Program Date Initiated: 08/27/2019 Target Resolution Date: 11/20/2019 Goal Status: Active Interventions: Provide education on orientation to the wound center Notes: Venous Leg Ulcer Nursing Diagnoses: Potential for venous Insuffiency (use before diagnosis confirmed) Goals: Patient will maintain optimal edema control Date Initiated: 08/27/2019 Target Resolution Date: 11/20/2019 Goal Status: Active Interventions: Assess peripheral edema status every visit. Notes: Wound/Skin Impairment Nursing Diagnoses: Impaired tissue integrity Goals: Ulcer/skin breakdown will heal within 14 weeks Date Initiated: 08/27/2019 Target Resolution Date: 11/20/2019 Goal Status: Active [name], [name] ([id number]) Interventions: Assess patient/caregiver ability to obtain necessary supplies Assess patient/caregiver ability to perform ulcer/skin care regimen upon admission and as needed Assess ulceration(s) every visit Notes: Electronic Signature(s) Signed: 01/20/2020 4:45:22 PM By: Army Melia Entered By: Army Melia on 01/20/2020 16:05:47 [place], [name] ([id number]) -------------------------------------------------------------------------------- Pain Assessment Details Patient Name: Martha Downs L. Date of Service: 01/20/2020 3:00 PM Medical Record Number: [id number] Patient Account Number: [id number] Date of Birth/Sex: [birth year] (64 y.o.  F) Treating RN: Montey Hora Primary Care Emalea Mix: Neta Ehlers Other Clinician: Referring Jearlean Demauro: Neta Ehlers Treating Claxton Levitz/Extender: Tito Dine in Treatment: 20 Active Problems Location of Pain Severity and Description of Pain Patient Has Paino No Site Locations Pain Management and Medication Current Pain Management: Electronic Signature(s) Signed: 01/20/2020 5:03:46 PM By: Montey Hora Entered By: Montey Hora on 01/20/2020 15:35:19 [place], [name]  ([id number]) -------------------------------------------------------------------------------- Patient/Caregiver Education Details Patient Name: Martha Siren. Date of Service: 01/20/2020 3:00 PM Medical Record Number: [id number] Patient Account Number: [id number] Date of Birth/Gender: [birth year] (64 y.o. F) Treating RN: Army Melia Primary Care Physician: Neta Ehlers Other Clinician: Referring Physician: Neta Ehlers Treating Physician/Extender: Tito Dine in Treatment: 20 Education Assessment Education Provided To: Patient Education Topics Provided Wound/Skin Impairment: Handouts: Caring for Your Ulcer Methods: Demonstration, Explain/Verbal Responses: State content correctly Electronic Signature(s) Signed: 01/20/2020 4:45:22 PM By: Army Melia Entered By: Army Melia on 01/20/2020 16:11:43 [name], [place]. ([id number]) -------------------------------------------------------------------------------- Wound Assessment Details Patient Name: Martha Downs L. Date of Service: 01/20/2020 3:00 PM Medical Record Number: [id number] Patient Account Number: [id number] Date of Birth/Sex: [birth year] (64 y.o. F) Treating RN: Montey Hora Primary Care Zykira Matlack: Neta Ehlers Other Clinician: Referring Nava Song: Neta Ehlers Treating Edina Winningham/Extender: Tito Dine in Treatment: 20 Wound Status Wound Number: 1 Primary Etiology: Lymphedema Wound Location: Left, Medial, Anterior Lower Leg Wound Status: Open Wounding Event: Gradually Appeared Comorbid History: Anemia, Coronary Artery Disease, Hypertension Date Acquired: 08/04/2019 Weeks Of Treatment: 20 Clustered Wound: No Photos Wound Measurements Length: (cm) 0.4 Width: (cm) 0.7 Depth: (cm) 0.1 Area: (cm) 0.22 Volume: (cm) 0.022 % Reduction in Area: 100% % Reduction in Volume: 100% Epithelialization: Medium (34-66%) Tunneling: No Undermining: No Wound Description Classification: Full  Thickness Without Exposed Support Struc Wound Margin: Flat and Intact Exudate Amount: Medium Exudate Type: Serosanguineous Exudate Color: red, brown tures Foul Odor After Cleansing: No Slough/Fibrino Yes Wound Bed Granulation Amount: Large (67-100%) Exposed Structure Granulation Quality: Pink Fascia Exposed: No Necrotic Amount: None Present (0%) Fat Layer (Subcutaneous Tissue) Exposed: Yes Tendon Exposed: No Muscle Exposed: No Joint Exposed: No Bone Exposed: No Treatment Notes Wound #1 (Left, Medial, Anterior Lower Leg) Notes xsorb, 4 layer bilateral Electronic Signature(s) Signed: 01/20/2020 5:03:46 PM By: [name], [name] ([id number]) Entered By: Montey Hora on 01/20/2020 15:38:32 [place], [name] ([id number]) -------------------------------------------------------------------------------- Vitals Details Patient Name: Martha Downs L. Date of Service: 01/20/2020 3:00 PM Medical Record Number: [id number] Patient Account Number: [id number] Date of Birth/Sex: 16-Oct-1955 (64 y.o. F) Treating RN: Army Melia Primary Care Jarmar Rousseau: Neta Ehlers Other Clinician: Referring Fayetta Sorenson: Neta Ehlers Treating Beaulah Romanek/Extender: Tito Dine in Treatment: 20 Vital Signs Time Taken: 15:25 Temperature (F): 99.4 Height (in): 62 Pulse (bpm): 87 Weight (lbs): 290 Respiratory Rate (breaths/min): 20 Body Mass Index (BMI): 53 Blood Pressure (mmHg): 141/59 Reference Range: 80 - 120 mg / dl Electronic Signature(s) Signed: 01/20/2020 4:38:04 PM By: Lorine Bears RCP, RRT, CHT Entered By: Becky Sax, [name] on 01/20/2020 15:26:44

## 2020-01-25 NOTE — Progress Notes (Signed)
KENYAH, OLLER ([id number]) Visit Report for 01/20/2020 Debridement Details Patient Name: [name], [name]. Date of Service: 01/20/2020 3:00 PM Medical Record Number: [id number] Patient Account Number: [id number] Date of Birth/Sex: 1956-05-10 (64 y.o. F) Treating RN: Army Melia Primary Care Provider: Neta Ehlers Other Clinician: Referring Provider: Neta Ehlers Treating Provider/Extender: Tito Dine in Treatment: 20 Debridement Performed for Wound #1 Left,Medial,Anterior Lower Leg Assessment: Performed By: Physician Ricard Dillon, MD Debridement Type: Chemical/Enzymatic/Mechanical Agent Used: saline and gauze Level of Consciousness (Pre- Awake and Alert procedure): Pre-procedure Verification/Time Out Yes - 16:05 Taken: Start Time: 16:06 Instrument: Other : saline and gauze Bleeding: None End Time: 16:07 Response to Treatment: Procedure was tolerated well Level of Consciousness (Post- Awake and Alert procedure): Post Debridement Measurements of Total Wound Length: (cm) 0.4 Width: (cm) 0.7 Depth: (cm) 0.1 Volume: (cm) 0.022 Character of Wound/Ulcer Post Debridement: Stable Post Procedure Diagnosis Same as Pre-procedure Electronic Signature(s) Signed: 01/20/2020 5:28:47 PM By: Linton Ham MD Signed: 01/25/2020 10:45:21 AM By: Army Melia Previous Signature: 01/20/2020 4:45:22 PM Version By: Army Melia Entered By: Linton Ham on 01/20/2020 16:54:56 Cornacchia, [place] ([id number]) -------------------------------------------------------------------------------- HPI Details Patient Name: [name], [name] Date of Service: 01/20/2020 3:00 PM Medical Record Number: [id number] Patient Account Number: [id number] Date of Birth/Sex: December 02, 1955 (64 y.o. F) Treating RN: Army Melia Primary Care Provider: Neta Ehlers Other Clinician: Referring Provider: Neta Ehlers Treating Provider/Extender: Tito Dine in Treatment: 20 History  of Present Illness HPI Description: 08/27/2019 patient presents today for initial evaluation here in our clinic concerning issues that she has been having with her bilateral lower extremities with lymphedema and weeping. She is previously been a patient over the past year of Dr. Jerilee Hoh at the wound care center in [facility]. With that being said she subsequently was recommended by the lymphedema clinic here in [place] to follow-up with [place] here instead of going back to [place] since they were in closer contact with the clinic here than they are obviously. With that being said the patient states that is why she came her way in order to be evaluated at this point. Fortunately there is no signs of active infection at this time. No fevers, chills, nausea, vomiting, or diarrhea. The patient does have a history of lymphedema, hypertension, long-term use of anticoagulant therapy, and obstructive sleep apnea. During the visit today and from checking into the discharge she was prone to unexpectedly falling asleep. I did discuss this with her daughter as well who states that this is very common for her she states she is not really sure how compliant her mother is with her CPAP machine that may be part of the issue. The patient did have ABIs performed which revealed have copies of his well today that was on 05/01/2019 and it showed a right ABI of 1.17 with a TBI of 1.12 and a left ABI of 1.24 with a TBI of 0.96. She has had 2 transfusions in the past 2 weeks apparently as well. 08/31/2019 upon evaluation today patient actually appears to be doing somewhat better in regard to her lower extremity ulcers. She still has a tremendous amount of drainage from the left lower extremity unfortunately but her right lower extremity is doing much better which is great news. Overall I am very pleased in this regard. With regard to home health we finally got things settled so home health will be coming out to  see her this would be very beneficial in  my opinion as I think they will be able to help her tremendously with more frequent dressing changes. 12/28-Patient is back here at 1 week visit, with regard to her bilateral lower extremity ulcers, she has a significant degree of lymphedema, significant degree of drainage more from the left and which continues to be an issue, patient had not been using her lymphedema pumps as recommended, she is concerned about amount of drainage and how quickly it soaks the dressing, she is concerned about the lymphedema pump sticking to her skin and was told to use it on the wraps. She will get in touch with her cardiologist about adjusting her Lasix dose. 09/14/2019 on evaluation today patient appears to be doing poorly in regard to her lower extremities especially left lower extremity. Unfortunately she is continue to have a significant amount of drainage which is preventing her from being able to heal keeping the wounds very macerated. She also has some warmth to touch in the anterior to medial portion of the left lower extremity compared to the remainder of the wound which does have made somewhat concerned about the possibility of cellulitis. Obviously it can be difficult to determine just strictly lymphedema versus cellulitis but right now I am somewhat concerned. She tells me that she can put a wrap on and at least has to change this twice a day or else she will be walking around leaving puddles of water behind her. Obviously this is fairly significant. She did see her primary care provider they did not see anything going on that they felt needed to be addressed from a diuretic standpoint but nonetheless I am still somewhat concerned based on what we are seeing to be honest. The patient and her husband are likewise concerned. 09/17/2019 patient is seen today in follow-up I last saw her on Tuesday at that point based on the severity of her lower extremity edema,  weeping, and erythema I did send her to the ER for further evaluation and treatment. She subsequently did go for that evaluation. It did appear that based on my review of the notes this is dated 09/14/2019 that the patient did have a BNP of 149.4. Which was notable. Subsequently she was given a dose of IV vancomycin as well as doxycycline and Keflex. It appears however that she was discharged on oral clindamycin 150 mg every 6 hours and to be honest compared to what I saw on Tuesday she actually seems to be doing somewhat better. Fortunately there is no evidence of active infection at this time which is good news. In the interim she did yesterday see her cardiologist as well Dr. Bettina Gavia. That was on 09/16/2019. Upon review of this note it does appear that his recommendation at this point was to increase the furosemide to 40 mg 2 times a day. He also recommend an increase of potassium 10 mg taken 2 times a day. His other recommendation was that the patient needs to have a follow-up with her hematology oncologist in order to see if they can do anything about iron transfusions for her as unfortunately her iron is still quite low and obviously oral supplementation is not going to be the best way to get this up. 09/24/2019 upon evaluation today patient appears to be doing quite well with regard to her lower extremities. The right lower extremity in particular has dramatically improved to be honest so has the left in my opinion. There does not appear to be any signs of active infection which is good  news. No fevers, chills, nausea, vomiting, or diarrhea. Patient is still taking the clindamycin at this time. 10/01/2019 on evaluation today patient appears to be showing some signs of improvement at this point in regard to her lower extremities. The right lower extremity is doing quite well the left lower extremity is still draining but again I do believe this is significantly better compared to what it was in the  past. Fortunately there is no evidence of active infection at this time. No fever chills noted 10/08/2019 upon evaluation today patient appears to have smaller areas of erythema and weeping on the left lower extremity as compared to last week's evaluation. The right leg is doing excellent. With that being said unfortunately she is still having a lot of significant swelling on the left in fact the swelling is worse today than it was last week. With that being said she is telling me that she is able to keep her wraps on now for at least the day sometimes even up to 30 hours before they are having to be changed. When they do have to change it they are using her lymphedema wraps at home to reapply what they can. She only changes that she tells me when it actually gets to the point that is draining so much that it leaks onto her floor. 10/15/2019 upon evaluation today patient appears to actually be doing very well with regard to her lower extremities in fact I believe the right lower extremity may be completely sealed up that we will likely watch it 1 more week. The left lower extremity is doing better she does not seem to be having as much weeping and leaking but still this is going on. Home health still has not gotten the [place] there is still just using ABD pads I believe was able to get Lauraine Rinne this would actually help out. 10/21/2019 upon evaluation today patient appears to be doing much better in regard to her lower extremities bilaterally. The right lower extremity has 1 area remaining that still draining the left lower extremity is still draining but not nearly what it was in the past. Overall I am extremely pleased with how things seem to be progressing. HALEAH, MAROTTI ([id number]) 10/29/2019 upon evaluation today patient actually appears to be doing quite well in regard to her lower extremities bilaterally both extremities show signs of improvement. The biggest issue we seem to be having is that  unfortunately the home health nurse does not seem to be wrapping this quite right in that it seems to slide down within just a few hours of them coming out to rewrap her according to the patient and her husband. The wraps we put on the state last much longer in fact generally until they are taken off. Fortunately there is no signs of infection although unfortunately she does still have a few open areas on both lower extremities at this time. 11/12/2019 upon evaluation today patient appears to be doing excellent in regard to her wounds. She does not have weeping like she is had in the past and overall I feel like she is doing quite well. With that being said this is something that has continued to give her trouble and that she states that the wrap is sliding down especially when home health puts this on. She tells me that the ones we put on do not seem to be sliding down at this point. Nonetheless I think that this is definitely something of concern. Obviously if it slides  down can cause trouble with getting the area to effectively heal and continue to do well. 11/19/2019 upon evaluation today patient actually appears to be doing very well with regard to her bilateral lower extremities. She just has a couple small areas 1 on the right 2 on the left that are still open at this point and all seem to be headed in the right direction. She is no longer saturating her wraps which is also good news. Overall I am extremely pleased with the progress that is been made up to this point. I do believe we may be able to switch into her lymphedema wraps at this point. 12/09/2019 unfortunately the patient appears to be doing more poorly this week in regard to her legs. We actually transitioned her to her lymphedema wraps last week although I do not believe they are controlling her edema well enough. She has areas that have reopened on the right and just a very small weeping region in fact there is not even really a wound  here. On the left she has wounds that have reopened and appear to be doing worse she is weeping quite significantly. I think Lauraine Rinne is good to be the ideal thing here I do not even think we can use the alginate as I am afraid that we just become too moist at this point. Fortunately there is no signs of active infection at this time. She is on doxycycline for completely separate issue dealing with a uterine fibroid. 12/17/2019 upon evaluation today patient actually appears to be doing excellent in regard to her wounds currently of the left lower extremity. These are all measuring better and looking well. The right lower extremity is completely sealed up and doing excellent. Overall very pleased with how things seem to be progressing. Her swelling is also down from last week. 12/23/2019 upon evaluation today patient's legs actually appear to be very dry compared to normal this is actually a good thing for her as unfortunately when she starts to swell too much she tends to weep more which leads to greater issues. Fortunately there is no signs of active infection at this time which is excellent. Overall I am very pleased with how things seem to be progressing. The only real issue we had is that unfortunately her wrap started to slide down apparently we did use the Unna paste to anchor last time I think that something we can definitely do. 01/06/20 upon evaluation today patient appears to be doing decently well in regard to her lower extremities. The right lower extremity appears to be completely healed which is great news. The left lower extremity unfortunately is still weeping or rather leaking. The right is not. With that being said there is no signs of active infection at this time which is great news. No fevers, chills, nausea, vomiting, or diarrhea. 5/5; patient I do not usually see however apparently our schedule was booked for tomorrow. She does not appear to have a wound on the right leg however  she has 1 remaining wound on the left anterior. She tells that she took her 4-layer compression off on Monday night because there was too much pruritus in her bilateral lower legs. Since then they have been using lymphedema wraps that her husband applies. She has compression pumps at home for 1 reason or another I do not think she is using them 5/13; the patient does not have a wound on the right lower leg. She has a small wound on the left medial  lower leg. Edema and erythema is a lot worse on the left leg than the right. She is using the compression pumps once a day I have asked her to use these twice a day. We have heard ordered her external compression stockings. I would definitely like to transition her to a stocking on the right leg next week may be both legs up the small wound on the left medial is healed. Electronic Signature(s) Signed: 01/20/2020 5:28:47 PM By: Linton Ham MD Entered By: Linton Ham on 01/20/2020 16:59:07 Carvin, [name] ([id number]) -------------------------------------------------------------------------------- Physical Exam Details Patient Name: MERLEEN, DICE L. Date of Service: 01/20/2020 3:00 PM Medical Record Number: [id number] Patient Account Number: [id number] Date of Birth/Sex: 1956-02-05 (64 y.o. F) Treating RN: Army Melia Primary Care Provider: Neta Ehlers Other Clinician: Referring Provider: Neta Ehlers Treating Provider/Extender: Tito Dine in Treatment: 20 Cardiovascular Pedal pulses palpable on the left. Integumentary (Hair, Skin) Erythema on the left leg compatible with stasis dermatitis nontender. Notes Wound exam; she still has the 1 small area on the left anterior lower leg. She continues to have poor control of the swelling in the left leg with chronic stasis dermatitis. There is no open wound on the left leg Electronic Signature(s) Signed: 01/20/2020 5:28:47 PM By: Linton Ham MD Entered By: Linton Ham  on 01/20/2020 17:00:11 Deis, [name] ([id number]) -------------------------------------------------------------------------------- Physician Orders Details Patient Name: [name] Date of Service: 01/20/2020 3:00 PM Medical Record Number: [id number] Patient Account Number: [id number] Date of Birth/Sex: [birth year] (64 y.o. F) Treating RN: Army Melia Primary Care Provider: Neta Ehlers Other Clinician: Referring Provider: Neta Ehlers Treating Provider/Extender: Tito Dine in Treatment: 20 Verbal / Phone Orders: No Diagnosis Coding Wound Cleansing Wound #1 Left,Medial,Anterior Lower Leg o Cleanse wound with mild soap and water o Dial antibacterial soap, wash wounds, rinse and pat dry prior to dressing wounds o May Shower, gently pat wound dry prior to applying new dressing. Skin Barriers/Peri-Wound Care Wound #1 Left,Medial,Anterior Lower Leg o Triamcinolone Acetonide Ointment (TCA) - to either leg on places that itch - ask patient Secondary Dressing Wound #1 Left,Medial,Anterior Lower Leg o XtraSorb Dressing Change Frequency Wound #1 Left,Medial,Anterior Lower Leg o Change dressing every week - Nurse visit as needed Follow-up Appointments Wound #1 Left,Medial,Anterior Lower Leg o Return Appointment in 1 week. Edema Control Wound #1 Left,Medial,Anterior Lower Leg o 4 Layer Compression System - Bilateral o Elevate legs to the level of the heart and pump ankles as often as possible o Compression Pump: Use compression pump on left lower extremity for 60 minutes, twice daily. o Compression Pump: Use compression pump on right lower extremity for 60 minutes, twice daily. Electronic Signature(s) Signed: 01/20/2020 4:45:22 PM By: Army Melia Signed: 01/20/2020 5:28:47 PM By: Linton Ham MD Entered By: Army Melia on 01/20/2020 16:09:52 Pflieger, [name]  ([id number]) -------------------------------------------------------------------------------- Problem List Details Patient Name: ELIDIA, ROSEN L. Date of Service: 01/20/2020 3:00 PM Medical Record Number: [id number] Patient Account Number: [id number] Date of Birth/Sex: [birth year] (64 y.o. F) Treating RN: Army Melia Primary Care Provider: Neta Ehlers Other Clinician: Referring Provider: Neta Ehlers Treating Provider/Extender: Tito Dine in Treatment: 20 Active Problems ICD-10 Encounter Code Description Active Date MDM Diagnosis I89.0 Lymphedema, not elsewhere classified 08/27/2019 No Yes L97.822 Non-pressure chronic ulcer of other part of left lower leg with fat layer 08/27/2019 No Yes exposed L97.812 Non-pressure chronic ulcer of other part of right lower leg with fat layer 08/27/2019 No Yes  exposed [place] (primary) hypertension 08/27/2019 No Yes Z79.01 Long term (current) use of anticoagulants 08/27/2019 No Yes Inactive Problems Resolved Problems Electronic Signature(s) Signed: 01/20/2020 5:28:47 PM By: Linton Ham MD Entered By: Linton Ham on 01/20/2020 16:54:28 [place], [name] ([id number]) -------------------------------------------------------------------------------- Progress Note Details Patient Name: [name] Date of Service: 01/20/2020 3:00 PM Medical Record Number: [id number] Patient Account Number: [id number] Date of Birth/Sex: April 13, 1956 (64 y.o. F) Treating RN: Army Melia Primary Care Provider: Neta Ehlers Other Clinician: Referring Provider: Neta Ehlers Treating Provider/Extender: Tito Dine in Treatment: 20 Subjective History of Present Illness (HPI) 08/27/2019 patient presents today for initial evaluation here in our clinic concerning issues that she has been having with her bilateral lower extremities with lymphedema and weeping. She is previously been a patient over the past year of Dr.  Jerilee Hoh at the wound care center in [facility]. With that being said she subsequently was recommended by the lymphedema clinic here in [place] to follow-up with [place] here instead of going back to [place] since they were in closer contact with the clinic here than they are obviously. With that being said the patient states that is why she came her way in order to be evaluated at this point. Fortunately there is no signs of active infection at this time. No fevers, chills, nausea, vomiting, or diarrhea. The patient does have a history of lymphedema, hypertension, long-term use of anticoagulant therapy, and obstructive sleep apnea. During the visit today and from checking into the discharge she was prone to unexpectedly falling asleep. I did discuss this with her daughter as well who states that this is very common for her she states she is not really sure how compliant her mother is with her CPAP machine that may be part of the issue. The patient did have ABIs performed which revealed have copies of his well today that was on 05/01/2019 and it showed a right ABI of 1.17 with a TBI of 1.12 and a left ABI of 1.24 with a TBI of 0.96. She has had 2 transfusions in the past 2 weeks apparently as well. 08/31/2019 upon evaluation today patient actually appears to be doing somewhat better in regard to her lower extremity ulcers. She still has a tremendous amount of drainage from the left lower extremity unfortunately but her right lower extremity is doing much better which is great news. Overall I am very pleased in this regard. With regard to home health we finally got things settled so home health will be coming out to see her this would be very beneficial in my opinion as I think they will be able to help her tremendously with more frequent dressing changes. 12/28-Patient is back here at 1 week visit, with regard to her bilateral lower extremity ulcers, she has a significant degree of  lymphedema, significant degree of drainage more from the left and which continues to be an issue, patient had not been using her lymphedema pumps as recommended, she is concerned about amount of drainage and how quickly it soaks the dressing, she is concerned about the lymphedema pump sticking to her skin and was told to use it on the wraps. She will get in touch with her cardiologist about adjusting her Lasix dose. 09/14/2019 on evaluation today patient appears to be doing poorly in regard to her lower extremities especially left lower extremity. Unfortunately she is continue to have a significant amount of drainage which is preventing her from being able to heal  keeping the wounds very macerated. She also has some warmth to touch in the anterior to medial portion of the left lower extremity compared to the remainder of the wound which does have made somewhat concerned about the possibility of cellulitis. Obviously it can be difficult to determine just strictly lymphedema versus cellulitis but right now I am somewhat concerned. She tells me that she can put a wrap on and at least has to change this twice a day or else she will be walking around leaving puddles of water behind her. Obviously this is fairly significant. She did see her primary care provider they did not see anything going on that they felt needed to be addressed from a diuretic standpoint but nonetheless I am still somewhat concerned based on what we are seeing to be honest. The patient and her husband are likewise concerned. 09/17/2019 patient is seen today in follow-up I last saw her on Tuesday at that point based on the severity of her lower extremity edema, weeping, and erythema I did send her to the ER for further evaluation and treatment. She subsequently did go for that evaluation. It did appear that based on my review of the notes this is dated 09/14/2019 that the patient did have a BNP of 149.4. Which was notable. Subsequently she  was given a dose of IV vancomycin as well as doxycycline and Keflex. It appears however that she was discharged on oral clindamycin 150 mg every 6 hours and to be honest compared to what I saw on Tuesday she actually seems to be doing somewhat better. Fortunately there is no evidence of active infection at this time which is good news. In the interim she did yesterday see her cardiologist as well Dr. Bettina Gavia. That was on 09/16/2019. Upon review of this note it does appear that his recommendation at this point was to increase the furosemide to 40 mg 2 times a day. He also recommend an increase of potassium 10 mg taken 2 times a day. His other recommendation was that the patient needs to have a follow-up with her hematology oncologist in order to see if they can do anything about iron transfusions for her as unfortunately her iron is still quite low and obviously oral supplementation is not going to be the best way to get this up. 09/24/2019 upon evaluation today patient appears to be doing quite well with regard to her lower extremities. The right lower extremity in particular has dramatically improved to be honest so has the left in my opinion. There does not appear to be any signs of active infection which is good news. No fevers, chills, nausea, vomiting, or diarrhea. Patient is still taking the clindamycin at this time. 10/01/2019 on evaluation today patient appears to be showing some signs of improvement at this point in regard to her lower extremities. The right lower extremity is doing quite well the left lower extremity is still draining but again I do believe this is significantly better compared to what it was in the past. Fortunately there is no evidence of active infection at this time. No fever chills noted 10/08/2019 upon evaluation today patient appears to have smaller areas of erythema and weeping on the left lower extremity as compared to last week's evaluation. The right leg is doing  excellent. With that being said unfortunately she is still having a lot of significant swelling on the left in fact the swelling is worse today than it was last week. With that being said she is  telling me that she is able to keep her wraps on now for at least the day sometimes even up to 30 hours before they are having to be changed. When they do have to change it they are using her lymphedema wraps at home to reapply what they can. She only changes that she tells me when it actually gets to the point that is draining so much that it leaks onto her floor. 10/15/2019 upon evaluation today patient appears to actually be doing very well with regard to her lower extremities in fact I believe the right lower extremity may be completely sealed up that we will likely watch it 1 more week. The left lower extremity is doing better she does not seem to be having as much weeping and leaking but still this is going on. Home health still has not gotten the [place] there is still just using ABD pads I believe was able to get Lauraine Rinne this would actually help out. 10/21/2019 upon evaluation today patient appears to be doing much better in regard to her lower extremities bilaterally. The right lower extremity has 1 area remaining that still draining the left lower extremity is still draining but not nearly what it was in the past. Overall I am extremely pleased with how things seem to be progressing. LEZLEE, EKBERG ([id number]) 10/29/2019 upon evaluation today patient actually appears to be doing quite well in regard to her lower extremities bilaterally both extremities show signs of improvement. The biggest issue we seem to be having is that unfortunately the home health nurse does not seem to be wrapping this quite right in that it seems to slide down within just a few hours of them coming out to rewrap her according to the patient and her husband. The wraps we put on the state last much longer in fact generally  until they are taken off. Fortunately there is no signs of infection although unfortunately she does still have a few open areas on both lower extremities at this time. 11/12/2019 upon evaluation today patient appears to be doing excellent in regard to her wounds. She does not have weeping like she is had in the past and overall I feel like she is doing quite well. With that being said this is something that has continued to give her trouble and that she states that the wrap is sliding down especially when home health puts this on. She tells me that the ones we put on do not seem to be sliding down at this point. Nonetheless I think that this is definitely something of concern. Obviously if it slides down can cause trouble with getting the area to effectively heal and continue to do well. 11/19/2019 upon evaluation today patient actually appears to be doing very well with regard to her bilateral lower extremities. She just has a couple small areas 1 on the right 2 on the left that are still open at this point and all seem to be headed in the right direction. She is no longer saturating her wraps which is also good news. Overall I am extremely pleased with the progress that is been made up to this point. I do believe we may be able to switch into her lymphedema wraps at this point. 12/09/2019 unfortunately the patient appears to be doing more poorly this week in regard to her legs. We actually transitioned her to her lymphedema wraps last week although I do not believe they are controlling her edema well enough. She has areas  that have reopened on the right and just a very small weeping region in fact there is not even really a wound here. On the left she has wounds that have reopened and appear to be doing worse she is weeping quite significantly. I think Lauraine Rinne is good to be the ideal thing here I do not even think we can use the alginate as I am afraid that we just become too moist at this point.  Fortunately there is no signs of active infection at this time. She is on doxycycline for completely separate issue dealing with a uterine fibroid. 12/17/2019 upon evaluation today patient actually appears to be doing excellent in regard to her wounds currently of the left lower extremity. These are all measuring better and looking well. The right lower extremity is completely sealed up and doing excellent. Overall very pleased with how things seem to be progressing. Her swelling is also down from last week. 12/23/2019 upon evaluation today patient's legs actually appear to be very dry compared to normal this is actually a good thing for her as unfortunately when she starts to swell too much she tends to weep more which leads to greater issues. Fortunately there is no signs of active infection at this time which is excellent. Overall I am very pleased with how things seem to be progressing. The only real issue we had is that unfortunately her wrap started to slide down apparently we did use the [place] paste to anchor last time I think that something we can definitely do. 01/06/20 upon evaluation today patient appears to be doing decently well in regard to her lower extremities. The right lower extremity appears to be completely healed which is great news. The left lower extremity unfortunately is still weeping or rather leaking. The right is not. With that being said there is no signs of active infection at this time which is great news. No fevers, chills, nausea, vomiting, or diarrhea. 5/5; patient I do not usually see however apparently our schedule was booked for tomorrow. She does not appear to have a wound on the right leg however she has 1 remaining wound on the left anterior. She tells that she took her 4-layer compression off on Monday night because there was too much pruritus in her bilateral lower legs. Since then they have been using lymphedema wraps that her husband applies. She has compression  pumps at home for 1 reason or another I do not think she is using them 5/13; the patient does not have a wound on the right lower leg. She has a small wound on the left medial lower leg. Edema and erythema is a lot worse on the left leg than the right. She is using the compression pumps once a day I have asked her to use these twice a day. We have heard ordered her external compression stockings. I would definitely like to transition her to a stocking on the right leg next week may be both legs up the small wound on the left medial is healed. Objective Constitutional Vitals Time Taken: 3:25 PM, Height: 62 in, Weight: 290 lbs, BMI: 53, Temperature: 99.4 F, Pulse: 87 bpm, Respiratory Rate: 20 breaths/min, Blood Pressure: 141/59 mmHg. Cardiovascular Pedal pulses palpable on the left. General Notes: Wound exam; she still has the 1 small area on the left anterior lower leg. She continues to have poor control of the swelling in the left leg with chronic stasis dermatitis. There is no open wound on the left leg Integumentary (  Hair, Skin) Erythema on the left leg compatible with stasis dermatitis nontender. Wound #1 status is Open. Original cause of wound was Gradually Appeared. The wound is located on the Left,Medial,Anterior Lower Leg. The wound measures 0.4cm length x 0.7cm width x 0.1cm depth; 0.22cm^2 area and 0.022cm^3 volume. There is Fat Layer (Subcutaneous Tissue) Exposed exposed. There is no tunneling or undermining noted. There is a medium amount of serosanguineous drainage noted. The wound margin is flat and intact. There is large (67-100%) pink granulation within the wound bed. There is no necrotic tissue within the wound bed. VERDA, BOWMAN ([id number]) Assessment Active Problems ICD-10 Lymphedema, not elsewhere classified Non-pressure chronic ulcer of other part of left lower leg with fat layer exposed Non-pressure chronic ulcer of other part of right lower leg with fat layer  exposed Essential (primary) hypertension Long term (current) use of anticoagulants Procedures Wound #1 Pre-procedure diagnosis of Wound #1 is a Lymphedema located on the Left,Medial,Anterior Lower Leg . There was a Chemical/Enzymatic/Mechanical debridement performed by Ricard Dillon, MD. With the following instrument(s): saline and gauze. Other agent used was saline and gauze. A time out was conducted at 16:05, prior to the start of the procedure. There was no bleeding. The procedure was tolerated well. Post Debridement Measurements: 0.4cm length x 0.7cm width x 0.1cm depth; 0.022cm^3 volume. Character of Wound/Ulcer Post Debridement is stable. Post procedure Diagnosis Wound #1: Same as Pre-Procedure Pre-procedure diagnosis of Wound #1 is a Lymphedema located on the Left,Medial,Anterior Lower Leg . There was a Four Layer Compression Therapy Procedure by Army Melia, RN. Post procedure Diagnosis Wound #1: Same as Pre-Procedure Plan Wound Cleansing: Wound #1 Left,Medial,Anterior Lower Leg: Cleanse wound with mild soap and water Dial antibacterial soap, wash wounds, rinse and pat dry prior to dressing wounds May Shower, gently pat wound dry prior to applying new dressing. Skin Barriers/Peri-Wound Care: Wound #1 Left,Medial,Anterior Lower Leg: Triamcinolone Acetonide Ointment (TCA) - to either leg on places that itch - ask patient Secondary Dressing: Wound #1 Left,Medial,Anterior Lower Leg: XtraSorb Dressing Change Frequency: Wound #1 Left,Medial,Anterior Lower Leg: Change dressing every week - Nurse visit as needed Follow-up Appointments: Wound #1 Left,Medial,Anterior Lower Leg: Return Appointment in 1 week. Edema Control: Wound #1 Left,Medial,Anterior Lower Leg: 4 Layer Compression System - Bilateral Elevate legs to the level of the heart and pump ankles as often as possible Compression Pump: Use compression pump on left lower extremity for 60 minutes, twice daily. Compression  Pump: Use compression pump on right lower extremity for 60 minutes, twice daily. 1. TCA 2. Xtrasorb to the wound on the left medial anterior lower leg. 3. Still under 4-layer compression bilaterally. I would like to transition her into below-knee external compression stocking next week may be bilaterally if the left is healed 4. I have asked her to increase her compression pump usage to 1 hour twice a day we will see if she can comply with this. I think this is the only thing that is going to maintain skin integrity on the left leg TYMISHA, TOMASO ([id number]) Electronic Signature(s) Signed: 01/20/2020 5:28:47 PM By: Linton Ham MD Entered By: Linton Ham on 01/20/2020 17:02:07 [place], [name] ([id number]) -------------------------------------------------------------------------------- SuperBill Details Patient Name: [name] Date of Service: 01/20/2020 Medical Record Number: [id number] Patient Account Number: [id number] Date of Birth/Sex: December 22, 1955 (64 y.o. F) Treating RN: Army Melia Primary Care Provider: Neta Ehlers Other Clinician: Referring Provider: Neta Ehlers Treating Provider/Extender: Tito Dine in Treatment: 20 Diagnosis Coding ICD-10  Codes Code Description I89.0 Lymphedema, not elsewhere classified L97.812 Non-pressure chronic ulcer of other part of right lower leg with fat layer exposed L97.822 Non-pressure chronic ulcer of other part of left lower leg with fat layer exposed [place] (primary) hypertension Z79.01 Long term (current) use of anticoagulants Facility Procedures CPT4: Description Modifier Quantity Code [id number] BILATERAL: Application of multi-layer venous compression system; leg (below knee), including 1 ankle and foot. Physician Procedures CPT4 Code: [id number] Description: [id number] - WC PHYS LEVEL 3 - EST PT Modifier: Quantity: 1 CPT4 Code: Description: ICD-10 Diagnosis Description [id number] Non-pressure  chronic ulcer of other part of right lower leg with fat la L97.822 Non-pressure chronic ulcer of other part of left lower leg with fat lay I89.0 Lymphedema, not elsewhere classified Modifier: yer exposed er exposed Quantity: Electronic Signature(s) Signed: 01/20/2020 5:28:47 PM By: Linton Ham MD Previous Signature: 01/20/2020 4:45:22 PM Version By: Army Melia Entered By: Linton Ham on 01/20/2020 17:02:37

## 2020-01-26 ENCOUNTER — Encounter (HOSPITAL_COMMUNITY): Payer: Medicare Other | Admitting: Cardiology

## 2020-01-28 ENCOUNTER — Ambulatory Visit: Payer: Medicare Other | Admitting: Internal Medicine

## 2020-02-02 ENCOUNTER — Other Ambulatory Visit: Payer: Self-pay

## 2020-02-02 ENCOUNTER — Encounter: Payer: Medicare Other | Admitting: Internal Medicine

## 2020-02-02 DIAGNOSIS — I890 Lymphedema, not elsewhere classified: Secondary | ICD-10-CM | POA: Diagnosis not present

## 2020-02-02 NOTE — Progress Notes (Signed)
PERIAN, POHLE ([id number]) Visit Report for 02/02/2020 HPI Details Patient Name: [name], [name]. Date of Service: 02/02/2020 3:30 PM Medical Record Number: [id number] Patient Account Number: [id number] Date of Birth/Sex: April 05, 1956 (64 y.o. F) Treating RN: Cornell Barman Primary Care Provider: Neta Ehlers Other Clinician: Referring Provider: Neta Ehlers Treating Provider/Extender: Beverly Gust in Treatment: 22 History of Present Illness HPI Description: 08/27/2019 patient presents today for initial evaluation here in our clinic concerning issues that she has been having with her bilateral lower extremities with lymphedema and weeping. She is previously been a patient over the past year of Dr. Jerilee Hoh at the wound care center in [facility]. With that being said she subsequently was recommended by the lymphedema clinic here in [place] to follow-up with [place] here instead of going back to [place] since they were in closer contact with the clinic here than they are obviously. With that being said the patient states that is why she came her way in order to be evaluated at this point. Fortunately there is no signs of active infection at this time. No fevers, chills, nausea, vomiting, or diarrhea. The patient does have a history of lymphedema, hypertension, long-term use of anticoagulant therapy, and obstructive sleep apnea. During the visit today and from checking into the discharge she was prone to unexpectedly falling asleep. I did discuss this with her daughter as well who states that this is very common for her she states she is not really sure how compliant her mother is with her CPAP machine that may be part of the issue. The patient did have ABIs performed which revealed have copies of his well today that was on 05/01/2019 and it showed a right ABI of 1.17 with a TBI of 1.12 and a left ABI of 1.24 with a TBI of 0.96. She has had 2 transfusions in the past 2 weeks  apparently as well. 08/31/2019 upon evaluation today patient actually appears to be doing somewhat better in regard to her lower extremity ulcers. She still has a tremendous amount of drainage from the left lower extremity unfortunately but her right lower extremity is doing much better which is great news. Overall I am very pleased in this regard. With regard to home health we finally got things settled so home health will be coming out to see her this would be very beneficial in my opinion as I think they will be able to help her tremendously with more frequent dressing changes. 12/28-Patient is back here at 1 week visit, with regard to her bilateral lower extremity ulcers, she has a significant degree of lymphedema, significant degree of drainage more from the left and which continues to be an issue, patient had not been using her lymphedema pumps as recommended, she is concerned about amount of drainage and how quickly it soaks the dressing, she is concerned about the lymphedema pump sticking to her skin and was told to use it on the wraps. She will get in touch with her cardiologist about adjusting her Lasix dose. 09/14/2019 on evaluation today patient appears to be doing poorly in regard to her lower extremities especially left lower extremity. Unfortunately she is continue to have a significant amount of drainage which is preventing her from being able to heal keeping the wounds very macerated. She also has some warmth to touch in the anterior to medial portion of the left lower extremity compared to the remainder of the wound which does have made somewhat concerned about the possibility  of cellulitis. Obviously it can be difficult to determine just strictly lymphedema versus cellulitis but right now I am somewhat concerned. She tells me that she can put a wrap on and at least has to change this twice a day or else she will be walking around leaving puddles of water behind her. Obviously this is  fairly significant. She did see her primary care provider they did not see anything going on that they felt needed to be addressed from a diuretic standpoint but nonetheless I am still somewhat concerned based on what we are seeing to be honest. The patient and her husband are likewise concerned. 09/17/2019 patient is seen today in follow-up I last saw her on Tuesday at that point based on the severity of her lower extremity edema, weeping, and erythema I did send her to the ER for further evaluation and treatment. She subsequently did go for that evaluation. It did appear that based on my review of the notes this is dated 09/14/2019 that the patient did have a BNP of 149.4. Which was notable. Subsequently she was given a dose of IV vancomycin as well as doxycycline and Keflex. It appears however that she was discharged on oral clindamycin 150 mg every 6 hours and to be honest compared to what I saw on Tuesday she actually seems to be doing somewhat better. Fortunately there is no evidence of active infection at this time which is good news. In the interim she did yesterday see her cardiologist as well Dr. Bettina Gavia. That was on 09/16/2019. Upon review of this note it does appear that his recommendation at this point was to increase the furosemide to 40 mg 2 times a day. He also recommend an increase of potassium 10 mg taken 2 times a day. His other recommendation was that the patient needs to have a follow-up with her hematology oncologist in order to see if they can do anything about iron transfusions for her as unfortunately her iron is still quite low and obviously oral supplementation is not going to be the best way to get this up. 09/24/2019 upon evaluation today patient appears to be doing quite well with regard to her lower extremities. The right lower extremity in particular has dramatically improved to be honest so has the left in my opinion. There does not appear to be any signs of active infection  which is good news. No fevers, chills, nausea, vomiting, or diarrhea. Patient is still taking the clindamycin at this time. 10/01/2019 on evaluation today patient appears to be showing some signs of improvement at this point in regard to her lower extremities. The right lower extremity is doing quite well the left lower extremity is still draining but again I do believe this is significantly better compared to what it was in the past. Fortunately there is no evidence of active infection at this time. No fever chills noted 10/08/2019 upon evaluation today patient appears to have smaller areas of erythema and weeping on the left lower extremity as compared to last week's evaluation. The right leg is doing excellent. With that being said unfortunately she is still having a lot of significant swelling on the left in fact the swelling is worse today than it was last week. With that being said she is telling me that she is able to keep her wraps on now for at least the day sometimes even up to 30 hours before they are having to be changed. When they do have to change it they are  using her lymphedema wraps at home to reapply what they can. She only changes that she tells me when it actually gets to the point that is draining so much that it leaks onto her floor. 10/15/2019 upon evaluation today patient appears to actually be doing very well with regard to her lower extremities in fact I believe the right lower extremity may be completely sealed up that we will likely watch it 1 more week. The left lower extremity is doing better she does not seem to be having as much weeping and leaking but still this is going on. Home health still has not gotten the [place] there is still just using ABD pads I believe was able to get Lauraine Rinne this would actually help out. EMALYN, BOGACZ ([id number]) 10/21/2019 upon evaluation today patient appears to be doing much better in regard to her lower extremities bilaterally. The  right lower extremity has 1 area remaining that still draining the left lower extremity is still draining but not nearly what it was in the past. Overall I am extremely pleased with how things seem to be progressing. 10/29/2019 upon evaluation today patient actually appears to be doing quite well in regard to her lower extremities bilaterally both extremities show signs of improvement. The biggest issue we seem to be having is that unfortunately the home health nurse does not seem to be wrapping this quite right in that it seems to slide down within just a few hours of them coming out to rewrap her according to the patient and her husband. The wraps we put on the state last much longer in fact generally until they are taken off. Fortunately there is no signs of infection although unfortunately she does still have a few open areas on both lower extremities at this time. 11/12/2019 upon evaluation today patient appears to be doing excellent in regard to her wounds. She does not have weeping like she is had in the past and overall I feel like she is doing quite well. With that being said this is something that has continued to give her trouble and that she states that the wrap is sliding down especially when home health puts this on. She tells me that the ones we put on do not seem to be sliding down at this point. Nonetheless I think that this is definitely something of concern. Obviously if it slides down can cause trouble with getting the area to effectively heal and continue to do well. 11/19/2019 upon evaluation today patient actually appears to be doing very well with regard to her bilateral lower extremities. She just has a couple small areas 1 on the right 2 on the left that are still open at this point and all seem to be headed in the right direction. She is no longer saturating her wraps which is also good news. Overall I am extremely pleased with the progress that is been made up to this point. I  do believe we may be able to switch into her lymphedema wraps at this point. 12/09/2019 unfortunately the patient appears to be doing more poorly this week in regard to her legs. We actually transitioned her to her lymphedema wraps last week although I do not believe they are controlling her edema well enough. She has areas that have reopened on the right and just a very small weeping region in fact there is not even really a wound here. On the left she has wounds that have reopened and appear to be doing worse  she is weeping quite significantly. I think Lauraine Rinne is good to be the ideal thing here I do not even think we can use the alginate as I am afraid that we just become too moist at this point. Fortunately there is no signs of active infection at this time. She is on doxycycline for completely separate issue dealing with a uterine fibroid. 12/17/2019 upon evaluation today patient actually appears to be doing excellent in regard to her wounds currently of the left lower extremity. These are all measuring better and looking well. The right lower extremity is completely sealed up and doing excellent. Overall very pleased with how things seem to be progressing. Her swelling is also down from last week. 12/23/2019 upon evaluation today patient's legs actually appear to be very dry compared to normal this is actually a good thing for her as unfortunately when she starts to swell too much she tends to weep more which leads to greater issues. Fortunately there is no signs of active infection at this time which is excellent. Overall I am very pleased with how things seem to be progressing. The only real issue we had is that unfortunately her wrap started to slide down apparently we did use the [place] paste to anchor last time I think that something we can definitely do. 01/06/20 upon evaluation today patient appears to be doing decently well in regard to her lower extremities. The right lower extremity appears  to be completely healed which is great news. The left lower extremity unfortunately is still weeping or rather leaking. The right is not. With that being said there is no signs of active infection at this time which is great news. No fevers, chills, nausea, vomiting, or diarrhea. 5/5; patient I do not usually see however apparently our schedule was booked for tomorrow. She does not appear to have a wound on the right leg however she has 1 remaining wound on the left anterior. She tells that she took her 4-layer compression off on Monday night because there was too much pruritus in her bilateral lower legs. Since then they have been using lymphedema wraps that her husband applies. She has compression pumps at home for 1 reason or another I do not think she is using them 5/13; the patient does not have a wound on the right lower leg. She has a small wound on the left medial lower leg. Edema and erythema is a lot worse on the left leg than the right. She is using the compression pumps once a day I have asked her to use these twice a day. We have heard ordered her external compression stockings. I would definitely like to transition her to a stocking on the right leg next week may be both legs up the small wound on the left medial is healed. 02/02/20-Patient returns at 2 weeks, she has no open areas, both legs are significantly edematous, she is noncompliant with her compression pumps I doubt she is using it once a day, she is also noncompliant with her CPAP although she states that she uses it most of the time Electronic Signature(s) Signed: 02/02/2020 3:54:50 PM By: Tobi Bastos MD, MBA Entered By: Tobi Bastos on 02/02/2020 15:54:49 Reen, [name] ([id number]) -------------------------------------------------------------------------------- Physical Exam Details Patient Name: Martha Downs L. Date of Service: 02/02/2020 3:30 PM Medical Record Number: [id number] Patient Account Number:  [id number] Date of Birth/Sex: 1955-09-30 (64 y.o. F) Treating RN: Cornell Barman Primary Care Provider: Neta Ehlers Other Clinician: Referring Provider: Neta Ehlers  Treating Provider/Extender: Beverly Gust in Treatment: 22 Constitutional alert and oriented x 3. sitting or standing blood pressure is within target range for patient.. supine blood pressure is within target range for patient.. pulse regular and within target range for patient.Marland Kitchen respirations regular, non-labored and within target range for patient.Marland Kitchen temperature within target range for patient.. . . Well-nourished and well-hydrated in no acute distress. Notes No open areas on either leg, lymphedema with stasis changes Electronic Signature(s) Signed: 02/02/2020 3:55:15 PM By: Tobi Bastos MD, MBA Entered By: Tobi Bastos on 02/02/2020 15:55:14 [name], [name] ([id number]) -------------------------------------------------------------------------------- Physician Orders Details Patient Name: Martha Downs L. Date of Service: 02/02/2020 3:30 PM Medical Record Number: [id number] Patient Account Number: [id number] Date of Birth/Sex: [birth year] (64 y.o. F) Treating RN: Cornell Barman Primary Care Provider: Neta Ehlers Other Clinician: Referring Provider: Neta Ehlers Treating Provider/Extender: Beverly Gust in Treatment: 85 Verbal / Phone Orders: No Diagnosis Coding Edema Control o Compression Pump: Use compression pump on left lower extremity for 60 minutes, twice daily. o Compression Pump: Use compression pump on right lower extremity for 60 minutes, twice daily. o Other: - Patient to apply lymphedema wraps as soon as she gets home. Notes Patient will call lymphedema clinic to restart treatment. Electronic Signature(s) Signed: 02/02/2020 5:27:22 PM By: Gretta Cool, BSN, RN, CWS, Kim RN, BSN Entered By: Gretta Cool, BSN, RN, CWS, Kim on 02/02/2020 15:58:45 [name], [name]  ([id number]) -------------------------------------------------------------------------------- Progress Note Details Patient Name: [name], [name] Date of Service: 02/02/2020 3:30 PM Medical Record Number: [id number] Patient Account Number: [id number] Date of Birth/Sex: 03/18/1956 (64 y.o. F) Treating RN: Cornell Barman Primary Care Provider: Neta Ehlers Other Clinician: Referring Provider: Neta Ehlers Treating Provider/Extender: Beverly Gust in Treatment: 22 Subjective History of Present Illness (HPI) 08/27/2019 patient presents today for initial evaluation here in our clinic concerning issues that she has been having with her bilateral lower extremities with lymphedema and weeping. She is previously been a patient over the past year of Dr. Jerilee Hoh at the wound care center in [facility]. With that being said she subsequently was recommended by the lymphedema clinic here in [place] to follow-up with [place] here instead of going back to [place] since they were in closer contact with the clinic here than they are obviously. With that being said the patient states that is why she came her way in order to be evaluated at this point. Fortunately there is no signs of active infection at this time. No fevers, chills, nausea, vomiting, or diarrhea. The patient does have a history of lymphedema, hypertension, long-term use of anticoagulant therapy, and obstructive sleep apnea. During the visit today and from checking into the discharge she was prone to unexpectedly falling asleep. I did discuss this with her daughter as well who states that this is very common for her she states she is not really sure how compliant her mother is with her CPAP machine that may be part of the issue. The patient did have ABIs performed which revealed have copies of his well today that was on 05/01/2019 and it showed a right ABI of 1.17 with a TBI of 1.12 and a left ABI of 1.24 with a TBI of 0.96.  She has had 2 transfusions in the past 2 weeks apparently as well. 08/31/2019 upon evaluation today patient actually appears to be doing somewhat better in regard to her lower extremity ulcers. She still has a tremendous amount of drainage from the left lower extremity unfortunately  but her right lower extremity is doing much better which is great news. Overall I am very pleased in this regard. With regard to home health we finally got things settled so home health will be coming out to see her this would be very beneficial in my opinion as I think they will be able to help her tremendously with more frequent dressing changes. 12/28-Patient is back here at 1 week visit, with regard to her bilateral lower extremity ulcers, she has a significant degree of lymphedema, significant degree of drainage more from the left and which continues to be an issue, patient had not been using her lymphedema pumps as recommended, she is concerned about amount of drainage and how quickly it soaks the dressing, she is concerned about the lymphedema pump sticking to her skin and was told to use it on the wraps. She will get in touch with her cardiologist about adjusting her Lasix dose. 09/14/2019 on evaluation today patient appears to be doing poorly in regard to her lower extremities especially left lower extremity. Unfortunately she is continue to have a significant amount of drainage which is preventing her from being able to heal keeping the wounds very macerated. She also has some warmth to touch in the anterior to medial portion of the left lower extremity compared to the remainder of the wound which does have made somewhat concerned about the possibility of cellulitis. Obviously it can be difficult to determine just strictly lymphedema versus cellulitis but right now I am somewhat concerned. She tells me that she can put a wrap on and at least has to change this twice a day or else she will be walking around leaving  puddles of water behind her. Obviously this is fairly significant. She did see her primary care provider they did not see anything going on that they felt needed to be addressed from a diuretic standpoint but nonetheless I am still somewhat concerned based on what we are seeing to be honest. The patient and her husband are likewise concerned. 09/17/2019 patient is seen today in follow-up I last saw her on Tuesday at that point based on the severity of her lower extremity edema, weeping, and erythema I did send her to the ER for further evaluation and treatment. She subsequently did go for that evaluation. It did appear that based on my review of the notes this is dated 09/14/2019 that the patient did have a BNP of 149.4. Which was notable. Subsequently she was given a dose of IV vancomycin as well as doxycycline and Keflex. It appears however that she was discharged on oral clindamycin 150 mg every 6 hours and to be honest compared to what I saw on Tuesday she actually seems to be doing somewhat better. Fortunately there is no evidence of active infection at this time which is good news. In the interim she did yesterday see her cardiologist as well Dr. Bettina Gavia. That was on 09/16/2019. Upon review of this note it does appear that his recommendation at this point was to increase the furosemide to 40 mg 2 times a day. He also recommend an increase of potassium 10 mg taken 2 times a day. His other recommendation was that the patient needs to have a follow-up with her hematology oncologist in order to see if they can do anything about iron transfusions for her as unfortunately her iron is still quite low and obviously oral supplementation is not going to be the best way to get this up. 09/24/2019 upon evaluation  today patient appears to be doing quite well with regard to her lower extremities. The right lower extremity in particular has dramatically improved to be honest so has the left in my opinion. There does  not appear to be any signs of active infection which is good news. No fevers, chills, nausea, vomiting, or diarrhea. Patient is still taking the clindamycin at this time. 10/01/2019 on evaluation today patient appears to be showing some signs of improvement at this point in regard to her lower extremities. The right lower extremity is doing quite well the left lower extremity is still draining but again I do believe this is significantly better compared to what it was in the past. Fortunately there is no evidence of active infection at this time. No fever chills noted 10/08/2019 upon evaluation today patient appears to have smaller areas of erythema and weeping on the left lower extremity as compared to last week's evaluation. The right leg is doing excellent. With that being said unfortunately she is still having a lot of significant swelling on the left in fact the swelling is worse today than it was last week. With that being said she is telling me that she is able to keep her wraps on now for at least the day sometimes even up to 30 hours before they are having to be changed. When they do have to change it they are using her lymphedema wraps at home to reapply what they can. She only changes that she tells me when it actually gets to the point that is draining so much that it leaks onto her floor. 10/15/2019 upon evaluation today patient appears to actually be doing very well with regard to her lower extremities in fact I believe the right lower extremity may be completely sealed up that we will likely watch it 1 more week. The left lower extremity is doing better she does not seem to be having as much weeping and leaking but still this is going on. Home health still has not gotten the [place] there is still just using ABD pads I believe was able to get Lauraine Rinne this would actually help out. 10/21/2019 upon evaluation today patient appears to be doing much better in regard to her lower extremities  bilaterally. The right lower extremity has 1 area remaining that still draining the left lower extremity is still draining but not nearly what it was in the past. Overall I am extremely pleased with how things seem to be progressing. [name], [name] ([id number]) 10/29/2019 upon evaluation today patient actually appears to be doing quite well in regard to her lower extremities bilaterally both extremities show signs of improvement. The biggest issue we seem to be having is that unfortunately the home health nurse does not seem to be wrapping this quite right in that it seems to slide down within just a few hours of them coming out to rewrap her according to the patient and her husband. The wraps we put on the state last much longer in fact generally until they are taken off. Fortunately there is no signs of infection although unfortunately she does still have a few open areas on both lower extremities at this time. 11/12/2019 upon evaluation today patient appears to be doing excellent in regard to her wounds. She does not have weeping like she is had in the past and overall I feel like she is doing quite well. With that being said this is something that has continued to give her trouble and that she  states that the wrap is sliding down especially when home health puts this on. She tells me that the ones we put on do not seem to be sliding down at this point. Nonetheless I think that this is definitely something of concern. Obviously if it slides down can cause trouble with getting the area to effectively heal and continue to do well. 11/19/2019 upon evaluation today patient actually appears to be doing very well with regard to her bilateral lower extremities. She just has a couple small areas 1 on the right 2 on the left that are still open at this point and all seem to be headed in the right direction. She is no longer saturating her wraps which is also good news. Overall I am extremely pleased with the  progress that is been made up to this point. I do believe we may be able to switch into her lymphedema wraps at this point. 12/09/2019 unfortunately the patient appears to be doing more poorly this week in regard to her legs. We actually transitioned her to her lymphedema wraps last week although I do not believe they are controlling her edema well enough. She has areas that have reopened on the right and just a very small weeping region in fact there is not even really a wound here. On the left she has wounds that have reopened and appear to be doing worse she is weeping quite significantly. I think Lauraine Rinne is good to be the ideal thing here I do not even think we can use the alginate as I am afraid that we just become too moist at this point. Fortunately there is no signs of active infection at this time. She is on doxycycline for completely separate issue dealing with a uterine fibroid. 12/17/2019 upon evaluation today patient actually appears to be doing excellent in regard to her wounds currently of the left lower extremity. These are all measuring better and looking well. The right lower extremity is completely sealed up and doing excellent. Overall very pleased with how things seem to be progressing. Her swelling is also down from last week. 12/23/2019 upon evaluation today patient's legs actually appear to be very dry compared to normal this is actually a good thing for her as unfortunately when she starts to swell too much she tends to weep more which leads to greater issues. Fortunately there is no signs of active infection at this time which is excellent. Overall I am very pleased with how things seem to be progressing. The only real issue we had is that unfortunately her wrap started to slide down apparently we did use the Unna paste to anchor last time I think that something we can definitely do. 01/06/20 upon evaluation today patient appears to be doing decently well in regard to her lower  extremities. The right lower extremity appears to be completely healed which is great news. The left lower extremity unfortunately is still weeping or rather leaking. The right is not. With that being said there is no signs of active infection at this time which is great news. No fevers, chills, nausea, vomiting, or diarrhea. 5/5; patient I do not usually see however apparently our schedule was booked for tomorrow. She does not appear to have a wound on the right leg however she has 1 remaining wound on the left anterior. She tells that she took her 4-layer compression off on Monday night because there was too much pruritus in her bilateral lower legs. Since then they have been using  lymphedema wraps that her husband applies. She has compression pumps at home for 1 reason or another I do not think she is using them 5/13; the patient does not have a wound on the right lower leg. She has a small wound on the left medial lower leg. Edema and erythema is a lot worse on the left leg than the right. She is using the compression pumps once a day I have asked her to use these twice a day. We have heard ordered her external compression stockings. I would definitely like to transition her to a stocking on the right leg next week may be both legs up the small wound on the left medial is healed. 02/02/20-Patient returns at 2 weeks, she has no open areas, both legs are significantly edematous, she is noncompliant with her compression pumps I doubt she is using it once a day, she is also noncompliant with her CPAP although she states that she uses it most of the time Objective Constitutional alert and oriented x 3. sitting or standing blood pressure is within target range for patient.. supine blood pressure is within target range for patient.. pulse regular and within target range for patient.Marland Kitchen respirations regular, non-labored and within target range for patient.Marland Kitchen temperature within target range for patient..  Well-nourished and well-hydrated in no acute distress. Vitals Time Taken: 3:40 PM, Height: 62 in, Weight: 290 lbs, BMI: 53, Temperature: 98.4 F, Pulse: 83 bpm, Respiratory Rate: 18 breaths/min, Blood Pressure: 133/52 mmHg. General Notes: No open areas on either leg, lymphedema with stasis changes Integumentary (Hair, Skin) Wound #1 status is Open. Original cause of wound was Gradually Appeared. The wound is located on the Left,Medial,Anterior Lower Leg. The wound measures 0cm length x 0cm width x 0cm depth; 0cm^2 area and 0cm^3 volume. There is Fat Layer (Subcutaneous Tissue) Exposed exposed. There is a medium amount of serosanguineous drainage noted. The wound margin is flat and intact. There is large (67-100%) pink granulation within the wound bed. There is no necrotic tissue within the wound bed. [name], [name] ([id number]) Plan -No open areas all open areas have healed -Patient not eligible for coverage of external compression devices due to having no open areas, referral to lymphedema clinic for this reason -Encouraged patient to diligently use the lymphedema pumps in her CPAP She will be discharged from the clinic at this time Electronic Signature(s) Signed: 02/02/2020 3:56:12 PM By: Tobi Bastos MD, MBA Entered By: Tobi Bastos on 02/02/2020 15:56:11 [name], [name] ([id number]) -------------------------------------------------------------------------------- SuperBill Details Patient Name: Martha Downs L. Date of Service: 02/02/2020 Medical Record Number: [id number] Patient Account Number: [id number] Date of Birth/Sex: 09-09-1956 (64 y.o. 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 [place] (primary) hypertension Z79.01 Long term (current) use of anticoagulants Facility Procedures CPT4 Code: [id number] Description: [id number] - WOUND CARE VISIT-LEV 2 EST PT Modifier: Quantity: 1 Physician Procedures CPT4 [id number] Description: [id number] - WC PHYS LEVEL 2 - EST PT Modifier: Quantity: 1 CPT4 Code: Description: ICD-10 Diagnosis Description I89.0 Lymphedema, not elsewhere classified Modifier: Quantity: Electronic Signature(s) Signed: 02/02/2020 5:13:05 PM By: Gretta Cool, BSN, RN, CWS, Kim RN, BSN Previous Signature: 02/02/2020 3:56:27 PM Version  By: Tobi Bastos MD, MBA Entered By: Gretta Cool, BSN, RN, CWS, Kim on 02/02/2020 17:13:04

## 2020-02-03 NOTE — Progress Notes (Signed)
ANAILAH, MOKRY ([id number]) Visit Report for 02/02/2020 Arrival Information Details Patient Name: [name], [name]. Date of Service: 02/02/2020 3:30 PM Medical Record Number: [id number] Patient Account Number: [id number] Date of Birth/Sex: November 29, 1955 (64 y.o. F) Treating RN: Cornell Barman Primary Care Curtis Cain: Neta Ehlers Other Clinician: Referring Anelis Hrivnak: Neta Ehlers Treating Lexandra Rettke/Extender: Beverly Gust in Treatment: 65 Visit Information History Since Last Visit Added or deleted any medications: No Patient Arrived: Ambulatory Any new allergies or adverse reactions: No Arrival Time: 15:39 Had a fall or experienced change in No Accompanied By: self activities of daily living that may affect Transfer Assistance: None risk of falls: Patient Identification Verified: Yes Signs or symptoms of abuse/neglect since last visito No Secondary Verification Process Completed: Yes Hospitalized since last visit: No Patient Has Alerts: Yes Implantable device outside of the clinic excluding No Patient Alerts: Patient on Blood Thinner cellular tissue based products placed in the center warfarin since last visit: Not Diabetic Has Dressing in Place as Prescribed: No 04/29/2019 [facility] Pain Present Now: No ABI R)1.17 L)1.24 TBI R) 1.12 L) 0.96 Electronic Signature(s) Signed: 02/03/2020 11:04:59 AM By: Lorine Bears RCP, RRT, CHT Entered By: Lorine Bears on 02/02/2020 15:39:39 Youngren, [name] ([id number]) -------------------------------------------------------------------------------- Clinic Level of Care Assessment Details Patient Name: [name], [name]. Date of Service: 02/02/2020 3:30 PM Medical Record Number: [id number] Patient Account Number: [id number] Date of Birth/Sex: Jun 11, 1956 (64 y.o. F) Treating RN: Cornell Barman Primary Care Nicole Defino: Neta Ehlers Other Clinician: Referring Kasia Trego: Neta Ehlers Treating Hunter Pinkard/Extender:  Beverly Gust in Treatment: 22 Clinic Level of Care Assessment Items TOOL 4 Quantity Score []  - Use when only an EandM is performed on FOLLOW-UP visit 0 ASSESSMENTS - Nursing Assessment / Reassessment X - Reassessment of Co-morbidities (includes updates in patient status) 1 10 X- 1 5 Reassessment of Adherence to Treatment Plan ASSESSMENTS - Wound and Skin Assessment / Reassessment []  - Simple Wound Assessment / Reassessment - one wound 0 X- 1 5 Complex Wound Assessment / Reassessment - multiple wounds []  - 0 Dermatologic / Skin Assessment (not related to wound area) ASSESSMENTS - Focused Assessment []  - Circumferential Edema Measurements - multi extremities 0 []  - 0 Nutritional Assessment / Counseling / Intervention []  - 0 Lower Extremity Assessment (monofilament, tuning fork, pulses) []  - 0 Peripheral Arterial Disease Assessment (using hand held doppler) ASSESSMENTS - Ostomy and/or Continence Assessment and Care []  - Incontinence Assessment and Management 0 []  - 0 Ostomy Care Assessment and Management (repouching, etc.) PROCESS - Coordination of Care X - Simple Patient / Family Education for ongoing care 1 15 []  - 0 Complex (extensive) Patient / Family Education for ongoing care X- 1 10 Staff obtains Programmer, systems, Records, Test Results / Process Orders []  - 0 Staff telephones HHA, Nursing Homes / Clarify orders / etc []  - 0 Routine Transfer to another Facility (non-emergent condition) []  - 0 Routine Hospital Admission (non-emergent condition) []  - 0 New Admissions / Biomedical engineer / Ordering NPWT, Apligraf, etc. []  - 0 Emergency Hospital Admission (emergent condition) X- 1 10 Simple Discharge Coordination []  - 0 Complex (extensive) Discharge Coordination PROCESS - Special Needs []  - Pediatric / Minor Patient Management 0 []  - 0 Isolation Patient Management []  - 0 Hearing / Language / Visual special needs []  - 0 Assessment of Community assistance  (transportation, D/C planning, etc.) []  - 0 Additional assistance / Altered mentation []  - 0 Support Surface(s) Assessment (bed, cushion, seat, etc.) INTERVENTIONS - Wound Cleansing /  Measurement Boza, Ashton L. ([id number]) []  - 0 Simple Wound Cleansing - one wound X- 1 5 Complex Wound Cleansing - multiple wounds X- 1 5 Wound Imaging (photographs - any number of wounds) []  - 0 Wound Tracing (instead of photographs) []  - 0 Simple Wound Measurement - one wound []  - 0 Complex Wound Measurement - multiple wounds INTERVENTIONS - Wound Dressings []  - Small Wound Dressing one or multiple wounds 0 []  - 0 Medium Wound Dressing one or multiple wounds []  - 0 Large Wound Dressing one or multiple wounds []  - 0 Application of Medications - topical []  - 0 Application of Medications - injection INTERVENTIONS - Miscellaneous []  - External ear exam 0 []  - 0 Specimen Collection (cultures, biopsies, blood, body fluids, etc.) []  - 0 Specimen(s) / Culture(s) sent or taken to Lab for analysis []  - 0 Patient Transfer (multiple staff / Civil Service fast streamer / Similar devices) []  - 0 Simple Staple / Suture removal (25 or less) []  - 0 Complex Staple / Suture removal (26 or more) []  - 0 Hypo / Hyperglycemic Management (close monitor of Blood Glucose) []  - 0 Ankle / Brachial Index (ABI) - do not check if billed separately X- 1 5 Vital Signs Has the patient been seen at the hospital within the last three years: Yes Total Score: 70 Level Of Care: New/Established - Level 2 Electronic Signature(s) Signed: 02/02/2020 5:27:22 PM By: Gretta Cool, BSN, RN, CWS, Kim RN, BSN Entered By: Gretta Cool, BSN, RN, CWS, Kim on 02/02/2020 17:12:55 Wray, [name] ([id number]) -------------------------------------------------------------------------------- Lower Extremity Assessment Details Patient Name: [name], [name] Date of Service: 02/02/2020 3:30 PM Medical Record Number: [id number] Patient Account Number: [id number] Date  of Birth/Sex: February 19, 1956 (64 y.o. F) Treating RN: Army Melia Primary Care Alyzabeth Pontillo: Neta Ehlers Other Clinician: Referring Domonic Hiscox: Neta Ehlers Treating Joao Mccurdy/Extender: Beverly Gust in Treatment: 22 Edema Assessment Assessed: [Left: No] [Right: No] Edema: [Left: Yes] [Right: Yes] Calf Left: Right: Point of Measurement: 32 cm From Medial Instep 64 cm 53 cm Ankle Left: Right: Point of Measurement: 10 cm From Medial Instep 46 cm 31 cm Vascular Assessment Pulses: Dorsalis Pedis Palpable: [Left:Yes] [Right:Yes] Electronic Signature(s) Signed: 02/02/2020 3:54:29 PM By: Army Melia Entered By: Army Melia on 02/02/2020 15:47:33 Innocent, Honesti L. ([id number]) -------------------------------------------------------------------------------- Multi Wound Chart Details Patient Name: Martha Downs L. Date of Service: 02/02/2020 3:30 PM Medical Record Number: [id number] Patient Account Number: [id number] Date of Birth/Sex: 1956/04/11 (64 y.o. F) Treating RN: Cornell Barman Primary Care Alister Staver: Neta Ehlers Other Clinician: Referring Deshundra Waller: Neta Ehlers Treating Lamont Glasscock/Extender: Beverly Gust in Treatment: 22 Vital Signs Height(in): 62 Pulse(bpm): 41 Weight(lbs): 290 Blood Pressure(mmHg): 133/52 Body Mass Index(BMI): 53 Temperature(F): 98.4 Respiratory Rate(breaths/min): 18 Photos: [N/A:N/A] Wound Location: Left, Medial, Anterior Lower Leg N/A N/A Wounding Event: Gradually Appeared N/A N/A Primary Etiology: Lymphedema N/A N/A Comorbid History: Anemia, Coronary Artery Disease, N/A N/A Hypertension Date Acquired: 08/04/2019 N/A N/A Weeks of Treatment: 22 N/A N/A Wound Status: Open N/A N/A Measurements L x W x D (cm) 0x0x0 N/A N/A Area (cm) : 0 N/A N/A Volume (cm) : 0 N/A N/A % Reduction in Area: 100.00% N/A N/A % Reduction in Volume: 100.00% N/A N/A Classification: Full Thickness Without Exposed N/A N/A Support Structures Exudate  Amount: Medium N/A N/A Exudate Type: Serosanguineous N/A N/A Exudate Color: red, brown N/A N/A Wound Margin: Flat and Intact N/A N/A Granulation Amount: Large (67-100%) N/A N/A Granulation Quality: Pink N/A N/A Necrotic Amount: None Present (0%) N/A N/A Exposed  Structures: Fat Layer (Subcutaneous Tissue) N/A N/A Exposed: Yes Fascia: No Tendon: No Muscle: No Joint: No Bone: No Epithelialization: Medium (34-66%) N/A N/A Treatment Notes Electronic Signature(s) Signed: 02/02/2020 5:27:22 PM By: Gretta Cool, BSN, RN, CWS, Kim RN, BSN Entered By: Gretta Cool, BSN, RN, CWS, Kim on 02/02/2020 15:55:56 Sthilaire, [name] ([id number]) -------------------------------------------------------------------------------- [place] Details Patient Name: [name], [name] Date of Service: 02/02/2020 3:30 PM Medical Record Number: [id number] Patient Account Number: [id number] Date of Birth/Sex: [birth year] (64 y.o. F) Treating RN: Cornell Barman Primary Care Kaitrin Seybold: Neta Ehlers Other Clinician: Referring Seleta Hovland: Neta Ehlers Treating Patterson Hollenbaugh/Extender: Beverly Gust in Treatment: 22 Active Inactive Electronic Signature(s) Signed: 02/03/2020 7:20:13 AM By: Gretta Cool, BSN, RN, CWS, Kim RN, BSN Previous Signature: 02/02/2020 5:27:22 PM Version By: Gretta Cool, BSN, RN, CWS, Kim RN, BSN Entered By: Gretta Cool, BSN, RN, CWS, Kim on 02/03/2020 07:20:13 Raetz, [name] ([id number]) -------------------------------------------------------------------------------- Pain Assessment Details Patient Name: [name], [name] Date of Service: 02/02/2020 3:30 PM Medical Record Number: [id number] Patient Account Number: [id number] Date of Birth/Sex: [birth year] (64 y.o. F) Treating RN: Army Melia Primary Care Ysela Hettinger: Neta Ehlers Other Clinician: Referring Gricel Copen: Neta Ehlers Treating Gerrianne Aydelott/Extender: Beverly Gust in Treatment: 22 Active Problems Location of Pain Severity and Description  of Pain Patient Has Paino No Site Locations Pain Management and Medication Current Pain Management: Electronic Signature(s) Signed: 02/02/2020 3:54:29 PM By: Army Melia Entered By: Army Melia on 02/02/2020 15:45:06 Nichter, [name] ([id number]) -------------------------------------------------------------------------------- Patient/Caregiver Education Details Patient Name: [name]. Date of Service: 02/02/2020 3:30 PM Medical Record Number: [id number] Patient Account Number: [id number] Date of Birth/Gender: 07/03/1956 (64 y.o. F) Treating RN: Cornell Barman Primary Care Physician: Neta Ehlers Other Clinician: Referring Physician: Neta Ehlers Treating Physician/Extender: Beverly Gust in Treatment: 22 Education Assessment Education Provided To: Patient Education Topics Provided Wound/Skin Impairment: Handouts: Caring for Your Ulcer Methods: Demonstration, Explain/Verbal Responses: State content correctly Electronic Signature(s) Signed: 02/02/2020 5:27:22 PM By: Gretta Cool, BSN, RN, CWS, Kim RN, BSN Entered By: Gretta Cool, BSN, RN, CWS, Kim on 02/02/2020 15:59:42 Bohan, [name] ([id number]) -------------------------------------------------------------------------------- Wound Assessment Details Patient Name: [name], [name] Date of Service: 02/02/2020 3:30 PM Medical Record Number: [id number] Patient Account Number: [id number] Date of Birth/Sex: 1956-02-16 (64 y.o. F) Treating RN: Army Melia Primary Care Siani Utke: Neta Ehlers Other Clinician: Referring Traver Meckes: Neta Ehlers Treating Sharvi Mooneyhan/Extender: Beverly Gust in Treatment: 22 Wound Status Wound Number: 1 Primary Etiology: Lymphedema Wound Location: Left, Medial, Anterior Lower Leg Wound Status: Open Wounding Event: Gradually Appeared Comorbid History: Anemia, Coronary Artery Disease, Hypertension Date Acquired: 08/04/2019 Weeks Of Treatment: 22 Clustered Wound: No Photos Wound  Measurements Length: (cm) 0 Width: (cm) 0 Depth: (cm) 0 Area: (cm) Volume: (cm) % Reduction in Area: 100% % Reduction in Volume: 100% Epithelialization: Medium (34-66%) 0 0 Wound Description Classification: Full Thickness Without Exposed Support Structu Wound Margin: Flat and Intact Exudate Amount: Medium Exudate Type: Serosanguineous Exudate Color: red, brown res Foul Odor After Cleansing: No Slough/Fibrino Yes Wound Bed Granulation Amount: Large (67-100%) Exposed Structure Granulation Quality: Pink Fascia Exposed: No Necrotic Amount: None Present (0%) Fat Layer (Subcutaneous Tissue) Exposed: Yes Tendon Exposed: No Muscle Exposed: No Joint Exposed: No Bone Exposed: No Electronic Signature(s) Signed: 02/02/2020 3:54:29 PM By: Army Melia Entered By: Army Melia on 02/02/2020 15:45:47 Markowicz, [name] -------------------------------------------------------------------------------- Vitals Details Patient Name: Martha Downs L. Date of Service: 02/02/2020 3:30 PM Medical Record Number: [id number] Patient Account Number: [id number] Date of Birth/Sex: 1956/08/13 (  64 y.o. F) Treating RN: Cornell Barman Primary Care Danna Casella: Neta Ehlers Other Clinician: Referring Durene Dodge: Neta Ehlers Treating Dameon Soltis/Extender: Beverly Gust in Treatment: 22 Vital Signs Time Taken: 15:40 Temperature (F): 98.4 Height (in): 62 Pulse (bpm): 83 Weight (lbs): 290 Respiratory Rate (breaths/min): 18 Body Mass Index (BMI): 53 Blood Pressure (mmHg): 133/52 Reference Range: 80 - 120 mg / dl Electronic Signature(s) Signed: 02/03/2020 11:04:59 AM By: Lorine Bears RCP, RRT, CHT Entered By: Lorine Bears on 02/02/2020 15:43:22

## 2020-02-15 ENCOUNTER — Ambulatory Visit: Payer: Medicare Other | Admitting: Occupational Therapy

## 2020-02-16 ENCOUNTER — Encounter: Payer: Self-pay | Admitting: Occupational Therapy

## 2020-02-16 ENCOUNTER — Ambulatory Visit: Payer: Medicare Other | Attending: Internal Medicine | Admitting: Occupational Therapy

## 2020-02-16 ENCOUNTER — Other Ambulatory Visit: Payer: Self-pay

## 2020-02-16 DIAGNOSIS — I890 Lymphedema, not elsewhere classified: Secondary | ICD-10-CM | POA: Diagnosis not present

## 2020-02-17 NOTE — Therapy (Signed)
[place] MAIN [facility] SERVICES [address] [place], [place], [address] Phone: [phone]   Fax:  [phone]  Occupational Therapy Evaluation  Patient Details  Name: [name] MRN: [id number] Date of Birth: 14-Jan-1956 Referring Provider (OT): Linton Ham, MD   Encounter Date: 02/16/2020   OT End of Session - 02/17/20 1557    Visit Number 1    Number of Visits 36    Date for OT Re-Evaluation 05/17/20    OT Start Time 1112    OT Stop Time 1220    OT Time Calculation (min) 68 min    Activity Tolerance Patient tolerated treatment well;No increased pain    Behavior During Therapy WFL for tasks assessed/performed   sleepy          Past Medical History:  Diagnosis Date  . Aortic stenosis 03/23/2013   Overview:  02/18/13 TTE EF >55%. Critical AS with mean Ao valve gradient of 82 mm Hg. No AI. No MR, PR, mild TR. Estimated RVSP 30 mm Hg.  . Bicuspid aortic valve   . Edema of both legs 02/20/2017  . 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 Heart failure ([place])   . HTN (hypertension) 03/23/2013  . 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 Morbid obesity ([place]) 02/20/2017  . Sleep apnea     Past Surgical History:  Procedure Laterality Date  . BUBBLE STUDY  06/07/2019   Procedure: BUBBLE STUDY;  Surgeon: Buford Dresser, MD;  Location: [facility];  Service: Cardiovascular;;  . CHOLECYSTECTOMY  1983  . RIGHT/LEFT HEART CATH AND CORONARY ANGIOGRAPHY N/A 04/01/2019   Procedure: RIGHT/LEFT HEART CATH AND CORONARY ANGIOGRAPHY;  Surgeon: Lorretta Harp, MD;  Location: [facility];  Service: Cardiovascular;  Laterality: N/A;  . TEE WITHOUT CARDIOVERSION N/A 07/01/2018   Procedure: TRANSESOPHAGEAL ECHOCARDIOGRAM (TEE);  Surgeon: Buford Dresser, MD;  Location: [facility] ENDOSCOPY;  Service: Cardiovascular;  Laterality: N/A;  . TEE WITHOUT CARDIOVERSION N/A 06/07/2019   Procedure: TRANSESOPHAGEAL ECHOCARDIOGRAM (TEE);  Surgeon:  Buford Dresser, MD;  Location: [facility] ENDOSCOPY;  Service: Cardiovascular;  Laterality: N/A;  . TISSUE AORTIC VALVE REPLACEMENT      There were no vitals filed for this visit.   Subjective Assessment - 02/17/20 1555    Subjective  [name] is referred to Occupational Therapy for new evaluation and treatment for BLE Lymphedema (LE) by Linton Ham, MD after completing a  22 visit course of weekly wound care to BLE at the [facility] (08/27/19 - 02/02/20) . An in depth review of the record reveals that condition of non-pressure related leg ulcers fluctuated over time with both legs remaining significantly edematous with intermittent lymphorrhea  over time. Record reveal an episode of cellulitis treated with multiple oral antibiotis, and a course of home health nursing to increase frequency of dressing changes. Pt remained non-compliant with CPap and sequential pneumatic compression devices ("pumps"). Pt's goals for OT are to prevent future leg wounds and secure appropriate compression garments that are easy to don and doff.    Patient is accompanied by: Family member    Pertinent History Complex  H&P with several contributing co-morbidities, including CHF, CAD, HTN, Aortic stenosis s/p bioprosthtic valve replacement, severe OSA (bipap), chronic kidney insufficiency, super obesity    Limitations difficulty walking, impaired functional mobility and transfers, generalized deconditioning, impaired activity tolerance, decreased endurance, AROM limitations at hips, knees and ankles 2/2 body habitus/ girth at joints,    Repetition Increases Symptoms  Special Tests + Stemmer Sign base of toes bilaterally    Patient Stated Goals identify and fit appropriate compression garments/ devices to limit LE progression    Currently in Pain? Yes    Pain Score 5     Pain Location Leg    Pain Orientation Right;Left    Pain Descriptors / Indicators  Aching;Tiring;Heaviness;Sore;Discomfort;Tender;Tightness;Pressure    Pain Type Chronic pain    Pain Onset More than a month ago    Pain Frequency Intermittent    Aggravating Factors  standing, walking, dependent sitting    Pain Relieving Factors elevation, compression    Effect of Pain on Daily Activities BLE swelling and associated pain limit functional performance of basic and daily ADLs, productive activities, leisure pursuits, social participation and functional ambulation,  transfers and bed mobility.             [facility] OT Assessment - 02/17/20 0001      Assessment   Medical Diagnosis Moderate BLE stage II Lymphedema    Referring Provider (OT) Linton Ham, MD    Onset Date/Surgical Date --   03/2013 s/p aortic valve replacement   Hand Dominance Right    Prior Therapy Intensive Phase CDT. wounds and lymphorrhea persisted. DCd to wound care      Precautions   Precautions Fall;Other (comment)   CARDIAC precautions for compression and MLD    Precaution Comments cellulitis precautions      Balance Screen   Has the patient fallen in the past 6 months Yes    How many times? 5    Has the patient had a decrease in activity level because of a fear of falling?  Yes    Is the patient reluctant to leave their home because of a fear of falling?  No      Home  Environment   Living Arrangements Spouse/significant other    Type of Home House    Home Access Stairs    Entrance Stairs-Rails --   2   Entrance Stairs-Number of Steps 2    Home Layout Two level    Alternate Level Stairs - Number of Steps --   no hand rails   Bathroom Shower/Tub Tub/Shower unit;Curtain    How accessible Accessible via walker    [place] - 4 wheels    Additional Comments rollator    Lives With Spouse      Prior Function   Level of Independence Independent with household mobility with device;Requires assistive device for independence;Needs assistance with ADLs;Needs assistance with  homemaking;Needs assistance with gait;Needs assistance with transfers      IADL   Prior Level of Function Shopping Max A    Shopping Needs to be accompanied on any shopping trip    Prior Level of Function Light Housekeeping Max A    Light Housekeeping Needs help with all home maintenance tasks    Prior Level of Function Meal Prep Max A    Meal Prep Able to complete simple cold meal and snack prep    Prior Level of Function Community Mobility Max A    Community Mobility Relies on family or friends for transportation      Mobility   Mobility Status History of falls    Mobility Status Comments transport chair in community, Radiation protection practitioner at home      Vision - History   Baseline Vision Wears glasses all the time      Activity Tolerance   Activity Tolerance Tolerates 10-20 min activity with multiple  rests      Cognition   Overall Cognitive Status Within Functional Limits for tasks assessed      Observation/Other Assessments   Outcome Measures FOTO, BLE comparative limb volumetrics      Posture/Postural Control   Posture Comments BLE comparative limb volumetrics, Lymphedema Life IImpact Scale (LLIS), Lower Extremity Funtionall Scale (LEFS)      Sensation   Light Touch Appears Intact      Coordination   Gross Motor Movements are Fluid and Coordinated Not tested    Fine Motor Movements are Fluid and Coordinated Yes      AROM   Overall AROM Comments decreased at ankles, knees and hips 2/2 body habitus and girth due to swelling      Strength   Overall Strength Deficits;Other (comment)   2/2 deconditioning                   OT Treatments/Exercises (OP) - 02/17/20 0001      Transfers   Transfers Sit to Stand;Stand to Sit    Sit to Stand From chair/3-in-1;With upper extremity assist;With armrests    Stand to Sit To chair/3-in-1;With upper extremity assist;With armrests      ADLs   Overall ADLs impaired 2/2 leg swelling. All ADLs requiiring standing and/ or walking > 10  minutes limited by lymphedema and associated pain. Pt Max A for lymphedema self care    LB Dressing impaired 2/2 leg swelling    Bathing LB bathing impaired 2/2 leg swelling    Functional Mobility impaired 2/2 leg swelling    Cooking impaired 2/2 leg swelling    Home Maintenance impaired 2/2 leg swelling    Driving impaired 2/2 leg swelling    Work impaired 2/2 leg swelling    Leisure impaired 2/2 leg swelling      Manual Therapy   Manual Therapy Edema management               Severe, Stage II, BLE phleboLymphedema   Skin  Description Hyper-Keratosis [place] Shiny Tight Fibrotic Fatty Doughy Indurated    x x x Thickened, dense, non-pitting      x   Skin dry Flaky Erythema Macerated   x  x    Color Redness Present Pallor Blanching Hemosiderin Staining Other   Moderate-severe  x      Odor Malodorous Yeast Fungal infection  Absent      x   Temperature Warm Cool wnl   slightly        Pitting Edema   1+ 2+ 3+ 4+ Non-pitting        x   Girth Symmetrical Asymmetrical Other Distribution     By visual assessment, LLE ~ 25%> in limb volume thab RLE. RLE appears swollen by ~ 20% Toes to knees   Stemmer Sign Positive Negative     +    Lymphorra History Of:  Present Absent   x   x   Wounds History Of Present Absent Venous Arterial Pressure Size    x  X ?         Signs of Infection Redness Warmth Erythema Acute Swelling Drainage Borders   x  x               Sensation Light Touch Deep pressure Hypersensitivty   Present Impaired Present Impaired Absent Impaired   x  x  x     Nails WNL Fungus Other       x Hair Growth  Symmetrical Asymmetrical       Skin Creases Base of toes  Ankles   Base of Fingers Medial Thighs         Abdominal pannus Medial legs     x x   x  x x      OT Education - 02/17/20 1556    Education Details Provided Pt and family education regarding lymphatic structure and function, etiologies, onset patterns and  stages of progression. Discussed  impact of obesity on lymphatic function. Outlined Complete Decongestive Therapy (CDT)  as standard of care and provided in depth information regarding 4 primary components of both Intensive and Self Management Phases, including Manual Lymph Drainage (MLD), compression wrapping and garments, skin care, and therapeutic exercise.   Pilar Plate discussion of high burden of care,  Discussed  Importance of daily, ongoing LE self-care essential to retaining clinical gains and limiting progression.  Lastly, reviewed lymphedema precautions, including cellulitis risk and difficulty with wound healing. Provided printed Lymphedema Workbook for reference.    Person(s) Educated Patient    Methods Explanation;Demonstration;Handout    Comprehension Verbalized understanding;Returned demonstration               OT Long Term Goals - 02/17/20 1600      OT LONG TERM GOAL #1   Title After skilled instruction Pt will be able to don and doff adjustable, velcro wrap style, short stretch compression garments, knee length Medven CircAid, using  30-40 mmHg with max family CG assistance  to limit LE progression and chronic , recurrent cellulitis.    Baseline Dependent    Time 8    Period Weeks    Target Date 05/17/20                 Plan - 02/17/20 1610    Clinical Impression Statement [name] is a 64 year-old female presenting with severe, stage II, BLE plebo-lymphedema (LE) secondary to suspected venous insufficiency and obesity w/ onset s/p aortic valve replacement.  A complex constellation of medical comorbidities significantly contributes to leg swelling by overloading lymphatic pathways, including CHF, CAD, HTN, OSA, chronic kidney insufficiency, chronic inflammation 2/2 recurrent wounds and cellulitis infections.  Sedentary lifestyle with frequent dependent positioning also contributes to leg swelling and the accumulation of fatty subcutaneous fibrosis distally. In this  case chronic, progressive BLE LE and associated pain limits ambulation, functional mobility and transfers.  It limits Pt's ability to perform basic and instrumental ADLs, productive activities and leisure pursuits, social participation and role performance at home and in the community. [name] is well known to this OT as participation in a recent course of Intensive Phase Complete Decongestive Therapy (CDT) was interrupted by return to wound care for recurrent, non-pressure-related le ulcers and recurrent cellulitis. At present wounds are closed and skin is without visible signs and symptoms of infection. RLE swelling appears well controlled. Significant swelling on the L below the knee persists. Rather than repeat a second course of CDT at this time Pt will be fit ASAP with accessible daytime compression garments that she is able to don and doff with as much independence as possible. Pt will benefit from fitting with custom, knee length, short stretch, Velcro wrap style, Mediven CircAid adjustable garments bilaterally for daytime use with 30-40 mmHg over silver liners for optimal infection control. Pt and spouse will participate in ADL training for garment measurement, wear, care and plan for regular replacement. Pt verbalized understanding that without diligent and consistent use of this, or  any compression garment system, with no less than 100% compliance limb swelling will not be successfully controlled and recurrent wounds and infection are likely. Furthermore, Pt verbalized understanding that without diligent elevation when seated , 100% compliance with cpap, impeccable daily skin care , daily use of sequential pneumatic compression devices prescribed by her doctor, and 100% compliance with daytime compression lymphedema will progress. Without skilled OT for CDT, LE will progress resulting in worsening of the condition and further functional decline.    OT Occupational Profile and History Comprehensive  Assessment- Review of records and extensive additional review of physical, cognitive, psychosocial history related to current functional performance    Occupational performance deficits (Please refer to evaluation for details): ADL's;IADL's;Rest and Sleep;Work;Leisure;Social Participation;Other   body image   Body Structure / Function / Physical Skills ADL;Endurance;Obesity;Decreased knowledge of precautions;Balance;Decreased knowledge of use of DME;IADL;Pain;Skin integrity;Cardiopulmonary status limiting activity;Edema;Gait;Mobility;ROM    Rehab Potential Good    Clinical Decision Making Multiple treatment options, significant modification of task necessary    Comorbidities impacting occupational performance description: multiple, complex comorbidities affecting participation in Intensive Phase Complete Decongestive Therapy protocols with demanding schedule and high burden of care    Modification or Assistance to Complete Evaluation  Max significant modification of tasks or assist is necessary to complete    OT Frequency 1x / week    OT Duration 8 weeks   and PRN   OT Treatment/Interventions Self-care/ADL training;DME and/or AE instruction;Patient/family education    Plan Complete anatomical measurements for custom, knee length, BLE, CircAid, adjustable, Velcro wrap-style, short stretch compression wraps and foot pieces for daytime compression    OT Home Exercise Plan Review all LE self care protocols. Emphasis on achieving 100% compliance with sequential pneumatic compression device, simple self MLD, skin care, ther ex and compression    Consulted and Agree with Plan of Care Patient           Patient will benefit from skilled therapeutic intervention in order to improve the following deficits and impairments:   Body Structure / Function / Physical Skills: ADL, Endurance, Obesity, Decreased knowledge of precautions, Balance, Decreased knowledge of use of DME, IADL, Pain, Skin integrity,  Cardiopulmonary status limiting activity, Edema, Gait, Mobility, ROM       Visit Diagnosis: Lymphedema, not elsewhere classified - Plan: Ot plan of care cert/re-cert    Problem List Patient Active Problem List   Diagnosis Date Noted  . Iron deficiency anemia due to chronic blood loss 10/19/2019  . Insomnia 10/07/2019  . Encounter for insertion of mirena IUD 08/17/2019  . Paroxysmal atrial fibrillation ([place]) 07/21/2019  . Long term (current) use of anticoagulants 06/11/2019  . TIA (transient ischemic attack) 06/04/2019  . Transient neurologic deficit 06/03/2019  . Anemia 06/03/2019  . Chronic diastolic CHF (congestive heart failure) ([place]) 06/03/2019  . CAD (coronary artery disease) 06/03/2019  . Essential hypertension 06/03/2019  . Avulsed toenail, initial encounter 05/27/2019  . Infection of toenail 05/27/2019  . Sleep apnea 09/25/2018  . Obstructive hypertrophic cardiomyopathy ([place]) 07/10/2018  . Dysfunctional uterine bleeding 04/22/2018  . Postmenopausal bleeding 04/16/2018  . Edema of both legs 02/20/2017  . 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 Morbid obesity ([place]) 02/20/2017  . Aortic stenosis 03/23/2013  . Hypertensive heart disease with heart failure ([place]) 03/23/2013    Andrey Spearman, MS, OTR/L, [facility] 02/17/20 4:47 PM  [place] MAIN [facility] SERVICES [address] [place], [place], [address] Phone:  [phone]   Fax:  [phone]  Name: [name] MRN: [id number] Date of Birth: Jul 07, 1956

## 2020-02-17 NOTE — Patient Instructions (Signed)

## 2020-02-22 ENCOUNTER — Other Ambulatory Visit: Payer: Self-pay

## 2020-02-22 ENCOUNTER — Ambulatory Visit: Payer: Medicare Other | Admitting: Occupational Therapy

## 2020-02-22 DIAGNOSIS — I890 Lymphedema, not elsewhere classified: Secondary | ICD-10-CM | POA: Diagnosis not present

## 2020-02-22 NOTE — Therapy (Signed)
[place] MAIN [facility] SERVICES [address] [place], [place], [address] Phone: [phone]   Fax:  [phone]  Occupational Therapy Treatment  Patient Details  Name: [name] MRN: [id number] Date of Birth: 1955-11-21 Referring Provider (OT): Linton Ham, MD   Encounter Date: 02/22/2020   OT End of Session - 02/22/20 1225    Visit Number 2    Number of Visits 36    Date for OT Re-Evaluation 05/17/20    OT Start Time 0913    OT Stop Time 1025    OT Time Calculation (min) 72 min    Activity Tolerance Patient tolerated treatment well;No increased pain           Past Medical History:  Diagnosis Date  . Aortic stenosis 03/23/2013   Overview:  02/18/13 TTE EF >55%. Critical AS with mean Ao valve gradient of 82 mm Hg. No AI. No MR, PR, mild TR. Estimated RVSP 30 mm Hg.  . Bicuspid aortic valve   . Edema of both legs 02/20/2017  . 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 Heart failure ([place])   . HTN (hypertension) 03/23/2013  . 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 Morbid obesity ([place]) 02/20/2017  . Sleep apnea     Past Surgical History:  Procedure Laterality Date  . BUBBLE STUDY  06/07/2019   Procedure: BUBBLE STUDY;  Surgeon: Buford Dresser, MD;  Location: [facility];  Service: Cardiovascular;;  . CHOLECYSTECTOMY  1983  . RIGHT/LEFT HEART CATH AND CORONARY ANGIOGRAPHY N/A 04/01/2019   Procedure: RIGHT/LEFT HEART CATH AND CORONARY ANGIOGRAPHY;  Surgeon: Lorretta Harp, MD;  Location: [facility];  Service: Cardiovascular;  Laterality: N/A;  . TEE WITHOUT CARDIOVERSION N/A 07/01/2018   Procedure: TRANSESOPHAGEAL ECHOCARDIOGRAM (TEE);  Surgeon: Buford Dresser, MD;  Location[facility] ENDOSCOPY;  Service: Cardiovascular;  Laterality: N/A;  . TEE WITHOUT CARDIOVERSION N/A 06/07/2019   Procedure: TRANSESOPHAGEAL ECHOCARDIOGRAM (TEE);  Surgeon: Buford Dresser, MD;  Location: [facility] ENDOSCOPY;  Service:  Cardiovascular;  Laterality: N/A;  . TISSUE AORTIC VALVE REPLACEMENT      There were no vitals filed for this visit.   Subjective Assessment - 02/22/20 1222    Subjective  Mrs [place] presents fo OT treatment session 2/6 for treatment of BLE lymphedema. PT has has no new complaints.    Patient is accompanied by: Family member    Pertinent History Complex  H&P with several contributing co-morbidities, including CHF, CAD, HTN, Aortic stenosis s/p bioprosthtic valve replacement, severe OSA (bipap), chronic kidney insufficiency, super obesity    Limitations difficulty walking, impaired functional mobility and transfers, generalized deconditioning, impaired activity tolerance, decreased endurance, AROM limitations at hips, knees and ankles 2/2 body habitus/ girth at joints,    Repetition Increases Symptoms    Special Tests + [place]mer Sign base of toes bilaterally    Patient Stated Goals identify and fit appropriate compression garments/ devices to limit LE progression    Pain Onset More than a month ago                        OT Treatments/Exercises (OP) - 02/22/20 0001      ADLs   ADL Education Given Yes      Manual Therapy   Manual Therapy Edema management                  OT Education - 02/22/20 1224    Education Details Pt education for measurement and  fitting custom compression garments with remote DME vendor    Person(s) Educated Patient    Methods Explanation;Demonstration;Handout    Comprehension Verbalized understanding;Returned demonstration               OT Long Term Goals - 02/17/20 1600      OT LONG TERM GOAL #1   Title After skilled instruction Pt will be able to don and doff adjustable, velcro wrap style, short stretch compression garments, knee length Medven CircAid, using  30-40 mmHg with max family CG assistance  to limit LE progression and chronic , recurrent cellulitis.    Baseline Dependent    Time 8    Period Weeks    Target Date  05/17/20                 Plan - 02/22/20 1227    Clinical Impression Statement Completed anatomical measurements for BLE knee length CircAid adjustable, Velcro-style daytime compression garments. Pt requires custom on L and Ready-to-wear Juxtafit Essental on R. Faxed all demographics and measurements to DME vendor. Pt will request medical records release. OT will see Pt for follow-along after garments received from DME provider.. Cont as per POC.    OT Occupational Profile and History Comprehensive Assessment- Review of records and extensive additional review of physical, cognitive, psychosocial history related to current functional performance    Occupational performance deficits (Please refer to evaluation for details): ADL's;IADL's;Rest and Sleep;Work;Leisure;Social Participation;Other   body image   Body Structure / Function / Physical Skills ADL;Endurance;Obesity;Decreased knowledge of precautions;Balance;Decreased knowledge of use of DME;IADL;Pain;Skin integrity;Cardiopulmonary status limiting activity;Edema;Gait;Mobility;ROM    Rehab Potential Good    Clinical Decision Making Multiple treatment options, significant modification of task necessary    Comorbidities impacting occupational performance description: multiple, complex comorbidities affecting participation in Intensive Phase Complete Decongestive Therapy protocols with demanding schedule and high burden of care    Modification or Assistance to Complete Evaluation  Max significant modification of tasks or assist is necessary to complete    OT Frequency 1x / week    OT Duration 8 weeks   and PRN   OT Treatment/Interventions Self-care/ADL training;DME and/or AE instruction;Patient/family education    Plan Complete anatomical measurements for custom, knee length, BLE, CircAid, adjustable, Velcro wrap-style, short stretch compression wraps and foot pieces for daytime compression    OT Home Exercise Plan Review all LE self care  protocols. Emphasis on achieving 100% compliance with sequential pneumatic compression device, simple self MLD, skin care, ther ex and compression    Consulted and Agree with Plan of Care Patient           Patient will benefit from skilled therapeutic intervention in order to improve the following deficits and impairments:   Body Structure / Function / Physical Skills: ADL, Endurance, Obesity, Decreased knowledge of precautions, Balance, Decreased knowledge of use of DME, IADL, Pain, Skin integrity, Cardiopulmonary status limiting activity, Edema, Gait, Mobility, ROM       Visit Diagnosis: Lymphedema, not elsewhere classified    Problem List Patient Active Problem List   Diagnosis Date Noted  . Iron deficiency anemia due to chronic blood loss 10/19/2019  . Insomnia 10/07/2019  . Encounter for insertion of mirena IUD 08/17/2019  . Paroxysmal atrial fibrillation ([place]) 07/21/2019  . Long term (current) use of anticoagulants 06/11/2019  . TIA (transient ischemic attack) 06/04/2019  . Transient neurologic deficit 06/03/2019  . Anemia 06/03/2019  . Chronic diastolic CHF (congestive heart failure) ([place]) 06/03/2019  . CAD (coronary artery  disease) 06/03/2019  . Essential hypertension 06/03/2019  . Avulsed toenail, initial encounter 05/27/2019  . Infection of toenail 05/27/2019  . Sleep apnea 09/25/2018  . Obstructive hypertrophic cardiomyopathy ([place]) 07/10/2018  . Dysfunctional uterine bleeding 04/22/2018  . Postmenopausal bleeding 04/16/2018  . Edema of both legs 02/20/2017  . 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 Morbid obesity ([place]) 02/20/2017  . Aortic stenosis 03/23/2013  . Hypertensive heart disease with heart failure ([place]) 03/23/2013    Andrey Spearman, MS, OTR/L, [facility] 02/22/20 12:31 PM   [place] MAIN [facility] SERVICES [address] [place], [place], [address] Phone:  [phone]   Fax:  [phone]  Name: EIZA CANNIFF MRN: [id number] Date of Birth: 1956/04/22

## 2020-03-28 ENCOUNTER — Other Ambulatory Visit: Payer: Self-pay

## 2020-03-28 ENCOUNTER — Ambulatory Visit (HOSPITAL_COMMUNITY)
Admission: RE | Admit: 2020-03-28 | Discharge: 2020-03-28 | Disposition: A | Discharge status: Home / Self Care | Payer: Medicare Other | Source: Ambulatory Visit | Attending: Cardiology | Admitting: Cardiology

## 2020-03-28 ENCOUNTER — Encounter (HOSPITAL_COMMUNITY): Payer: Self-pay | Admitting: Cardiology

## 2020-03-28 VITALS — BP 142/78 | HR 86 | Wt 279.0 lb

## 2020-03-28 DIAGNOSIS — Z8673 Personal history of transient ischemic attack (TIA), and cerebral infarction without residual deficits: Secondary | ICD-10-CM | POA: Diagnosis not present

## 2020-03-28 DIAGNOSIS — Z8249 Family history of ischemic heart disease and other diseases of the circulatory system: Secondary | ICD-10-CM | POA: Diagnosis not present

## 2020-03-28 DIAGNOSIS — R600 Localized edema: Secondary | ICD-10-CM | POA: Diagnosis not present

## 2020-03-28 DIAGNOSIS — Z79899 Other long term (current) drug therapy: Secondary | ICD-10-CM | POA: Diagnosis not present

## 2020-03-28 DIAGNOSIS — I5032 Chronic diastolic (congestive) heart failure: Secondary | ICD-10-CM | POA: Diagnosis not present

## 2020-03-28 DIAGNOSIS — E785 Hyperlipidemia, unspecified: Secondary | ICD-10-CM | POA: Insufficient documentation

## 2020-03-28 DIAGNOSIS — G4733 Obstructive sleep apnea (adult) (pediatric): Secondary | ICD-10-CM | POA: Diagnosis not present

## 2020-03-28 DIAGNOSIS — Z953 Presence of xenogenic heart valve: Secondary | ICD-10-CM | POA: Diagnosis not present

## 2020-03-28 DIAGNOSIS — L98499 Non-pressure chronic ulcer of skin of other sites with unspecified severity: Secondary | ICD-10-CM | POA: Diagnosis not present

## 2020-03-28 DIAGNOSIS — Z7901 Long term (current) use of anticoagulants: Secondary | ICD-10-CM | POA: Diagnosis not present

## 2020-03-28 DIAGNOSIS — Z954 Presence of other heart-valve replacement: Secondary | ICD-10-CM | POA: Diagnosis not present

## 2020-03-28 DIAGNOSIS — I110 Hypertensive heart disease with heart failure: Secondary | ICD-10-CM | POA: Diagnosis not present

## 2020-03-28 LAB — BASIC METABOLIC PANEL
Anion gap: 8 (ref 5–15)
BUN: 13 mg/dL (ref 8–23)
CO2: 27 mmol/L (ref 22–32)
Calcium: 9.8 mg/dL (ref 8.9–10.3)
Chloride: 104 mmol/L (ref 98–111)
Creatinine, Ser: 0.71 mg/dL (ref 0.44–1.00)
GFR calc Af Amer: >60 mL/min (ref >60)
GFR calc non Af Amer: >60 mL/min (ref >60)
Glucose, Bld: 124 mg/dL — ABNORMAL HIGH (ref 70–99)
Potassium: 3.8 mmol/L (ref 3.5–5.1)
Sodium: 139 mmol/L (ref 135–145)

## 2020-03-28 LAB — PROTIME-INR
INR: 1 (ref 0.8–1.2)
Prothrombin Time: 13.1 seconds (ref 11.4–15.2)

## 2020-03-28 MED ORDER — TORSEMIDE 20 MG PO TABS
60.0000 mg | ORAL_TABLET | Freq: Every day | ORAL | 3 refills | Status: DC
Start: 2020-03-28 — End: 2021-04-04

## 2020-03-28 MED ORDER — METOPROLOL SUCCINATE ER 25 MG PO TB24
ORAL_TABLET | ORAL | 3 refills | Status: DC
Start: 2020-03-28 — End: 2020-11-03

## 2020-03-28 NOTE — Patient Instructions (Signed)
Stop Metoprolol Tartrate  Stop Furosemide  Start Metoprolol Succinate 50 mg (2 tabs) in AM and 25 mg (1 tab) in PM  Start Torsemide 60 mg (3 tabs) Daily  Labs done today, we will call you for abnormal results  Labs needed in 1 week, we have provided you a prescription to have this done locally  You have been ordered a PYP Scan.  This is done in the Radiology Department of [facility].  When you come for this test please plan to be there 2-3 hours.  Your physician recommends that you schedule a follow-up appointment in: 1 month  If you have any questions or concerns before your next appointment please send [place] a message through [place] or call our office at [phone].    TO LEAVE A MESSAGE FOR THE NURSE SELECT OPTION 2, PLEASE LEAVE A MESSAGE INCLUDING: . YOUR NAME . DATE OF BIRTH . CALL BACK NUMBER . REASON FOR CALL**this is important as we prioritize the call backs  [place] AS LONG AS YOU CALL BEFORE 4:00 PM  At the [facility], you and your health needs are our priority. As part of our continuing mission to provide you with exceptional heart care, we have created designated Provider Care Teams. These Care Teams include your primary Cardiologist (physician) and Advanced Practice Providers (APPs- Physician Assistants and Nurse Practitioners) who all work together to provide you with the care you need, when you need it.   You may see any of the following providers on your designated Care Team at your next follow up: Marland Kitchen Dr Glori Bickers . Dr Loralie Champagne . Darrick Grinder, NP . Lyda Jester, PA . Audry Riles, PharmD   Please be sure to bring in all your medications bottles to every appointment.

## 2020-03-29 NOTE — Progress Notes (Signed)
PCP: [name], MD Cardiology: Dr. Bettina Gavia HF Cardiology: Dr. Aundra Dubin  64 y.o. with history of bicuspid aortic valve/AS with bioprosthetic aortic valve, severe OSA, chronic diastolic CHF and TIA was referred by Dr. Bettina Gavia for cardiology evaluation.  Patient had a bicuspid aortic valve and developed severe AS, necessitating bioprosthetic aortic valve replacement in 7/14.  Last year, she began to develop exertional dyspnea.  RHC/LHC in 7/20 showed normal coronaries and mildly elevated filling pressures.  In 9/20, she had a TIA.  TEE done at that time showed hyperdynamic LV with normal bioprosthetic aortic valve but there was a significant LVOT gradient thought to be due to vigorous LV contraction.  CTA at that time was concerning for possible thrombus on the aortic valve leaflets, so warfarin was started.  However, the TEE did not show evidence for valve thrombosis.   She has chronic lower extremity edema and has had ulcerations requiring wound care.  Legs have been swelling for 2 years.  She is not short of breath walking around her house but she does get short of breath walking up stairs. No chest pain.  No lightheadedness or palpitations. Her husband has amyloidosis, and she is concerned that she may have this as well.    ECG (9/20, personally reviewed): NSR, LVH with repolarization abnormality.  Labs (1/21): K 4.2, creatinine 0.7  PMH: 1. Bicuspid aortic valve: Aortic stenosis with bioprosthetic aortic valve replacement in 7/14.  - ?leaflet thrombosis => CTA chest 9/20 showed 2 hypo-attenuated areas under the leaflets concerning for thrombosis, but TEE did not show evidence for thrombosis.  2. OSA: Severe, uses Bipap.  3. Dysfunctional uterine bleeding with anemia.  4. Nonhealing leg ulcers.  5. PFO 6. H/o atrial tachycardia 7. Hyperlipidemia 8. Chronic diastolic CHF:  - RHC/LHC in 7/20 with normal coronaries; mean RA 9, PA 38/9, mean PCWP 21, CI 3.54.  - TEE (9/20): EF 65-70%  (hyperdynamic), severe LVH, increased gradient across LV outflow tract, normal RV, moderate-severe TR, normal bioprosthetic aortic valve w/o thrombus and mean gradient 18 mmhg.  9. TIA in 9/20 10. ABIs in 8/20 were normal  Social History   Socioeconomic History  . Marital status: Married    Spouse name: Not on file  . Number of children: Not on file  . Years of education: Not on file  . Highest education level: Not on file  Occupational History  . Not on file  Tobacco Use  . Smoking status: Never Smoker  . Smokeless tobacco: Never Used  Vaping Use  . Vaping Use: Never used  Substance and Sexual Activity  . Alcohol use: No  . Drug use: No  . Sexual activity: Not Currently  Other Topics Concern  . Not on file  Social History Narrative  . Not on file   Social Determinants of Health   Financial Resource Strain:   . Difficulty of Paying Living Expenses:   Food Insecurity:   . Worried About Charity fundraiser in the Last Year:   . Arboriculturist in the Last Year:   Transportation Needs:   . Film/video editor (Medical):   Marland Kitchen Lack of Transportation (Non-Medical):   Physical Activity:   . Days of Exercise per Week:   . Minutes of Exercise per Session:   Stress:   . Feeling of Stress :   Social Connections:   . Frequency of Communication with Friends and Family:   . Frequency of Social Gatherings with Friends and Family:   .  Attends Religious Services:   . Active Member of Clubs or Organizations:   . Attends Archivist Meetings:   Marland Kitchen Marital Status:   Intimate Partner Violence:   . Fear of Current or Ex-Partner:   . Emotionally Abused:   Marland Kitchen Physically Abused:   . Sexually Abused:    Family History  Problem Relation Age of Onset  . Parkinson's disease Father   . Heart attack Mother    ROS: All systems reviewed and negative except as per HPI.   Current Outpatient Medications  Medication Sig Dispense Refill  . Acetaminophen-Codeine (TYLENOL WITH  CODEINE #3 PO) Take by mouth once a week.    . camphor-menthol (SARNA) lotion Apply 1 application topically as needed for itching. 222 mL 0  . levonorgestrel (MIRENA) 20 MCG/24HR IUD 1 each by Intrauterine route once.    Marland Kitchen MYRBETRIQ 25 MG TB24 tablet Take 25 mg by mouth daily.    . potassium chloride (KLOR-CON) 10 MEQ tablet Take 1 tablet (10 mEq total) by mouth 2 (two) times daily. 180 tablet 1  . pravastatin (PRAVACHOL) 40 MG tablet Take 1 tablet (40 mg total) by mouth daily at 6 PM. 30 tablet 0  . triamcinolone cream (KENALOG) 0.1 % APPLY THIN FILM OF CREAM OVER THE LEG WITH EACH WRAP CHANGE. NOT TO BE PLACED IN THE WOUNDS    . warfarin (COUMADIN) 2.5 MG tablet Take 1 tablet (2.5 mg total) by mouth daily. 30 tablet 3  . metoprolol succinate (TOPROL XL) 25 MG 24 hr tablet Take 2 tablets (50 mg total) by mouth in the morning AND 1 tablet (25 mg total) every evening. 90 tablet 3  . ramelteon (ROZEREM) 8 MG tablet Take 1 tablet (8 mg total) by mouth at bedtime. (Patient not taking: Reported on 03/28/2020) 30 tablet 1  . torsemide (DEMADEX) 20 MG tablet Take 3 tablets (60 mg total) by mouth daily. 90 tablet 3   No current facility-administered medications for this encounter.   BP (!) 142/78   Pulse 86   Wt 126.6 kg (279 lb)   SpO2 97%   BMI 52.72 kg/m  General: NAD Neck: Thick, JVP difficult, no thyromegaly or thyroid nodule.  Lungs: Clear to auscultation bilaterally with normal respiratory effort. CV: Nondisplaced PMI.  Heart regular S1/S2, no S3/S4, 2/6 SEM RUSB.  2+ chronic edema to knees bilaterally.  No carotid bruit.  Unable to palpate pedal pulses.  Abdomen: Soft, nontender, no hepatosplenomegaly, no distention.  Skin: Small ulcer left lower leg.   Neurologic: Alert and oriented x 3.  Psych: Normal affect. Extremities: No clubbing or cyanosis.  HEENT: Normal.   Assessment/Plan: 1. Bioprosthetic aortic valve: Bicuspid aortic valve with severe AS, necessitated AVR in 7/14.  I  reviewed the 9/20 TEE, the valve moves well and I cannot see sign of thrombus.  However, the CTA also done in 9/20 showed hypo-attenuated areas under the valve leaflets that were concerning for possible thrombus.   - Given history of TIA in 9/20 with most likely cause cardioembolic, will continue warfarin.  Atrial fibrillation has not been seen.  2. OSA: Severe, she uses Bipap.  3. Chronic diastolic CHF: TEE in 2/40 showed hyperdynamic LV function with LVOT gradient and severe LVH, bioprosthetic aortic valve and RV appeared to function normally.  On exam, she has chronic lower extremity edema, suspect there is a component of lymphedema, but suspect CHF also is playing a role.  NYHA class II-III symptoms.  - Stop Lasix, start torsemide  60 mg daily.  BMET today and 10 days.  - With hyperdynamic LV function and LV outflow gradient, will push negative inotropy with increased beta blockade => stop metoprolol tartrate, start Toprol XL 50 qam/25 qpm.   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  Echo showed severe LVH.  I will arrange for a PYP scan and send myeloma panel and urine immunofixation.  I am not sure that she would be able to do a cardiac MRI.   Followup in 1 month.   Loralie Champagne 03/29/2020

## 2020-03-30 ENCOUNTER — Encounter: Payer: Medicare Other | Admitting: Occupational Therapy

## 2020-03-30 LAB — IMMUNOFIXATION, URINE

## 2020-03-31 LAB — MULTIPLE MYELOMA PANEL, SERUM
Albumin SerPl Elph-Mcnc: 3.7 g/dL (ref 2.9–4.4)
Albumin/Glob SerPl: 1 (ref 0.7–1.7)
Alpha 1: 0.2 g/dL (ref 0.0–0.4)
Alpha2 Glob SerPl Elph-Mcnc: 0.6 g/dL (ref 0.4–1.0)
B-Globulin SerPl Elph-Mcnc: 1.4 g/dL — ABNORMAL HIGH (ref 0.7–1.3)
Gamma Glob SerPl Elph-Mcnc: 1.6 g/dL (ref 0.4–1.8)
Globulin, Total: 3.8 g/dL (ref 2.2–3.9)
IgA: 587 mg/dL — ABNORMAL HIGH (ref 87–352)
IgG (Immunoglobin G), Serum: 1645 mg/dL — ABNORMAL HIGH (ref 586–1602)
IgM (Immunoglobulin M), Srm: 51 mg/dL (ref 26–217)
Total Protein ELP: 7.5 g/dL (ref 6.0–8.5)

## 2020-04-03 ENCOUNTER — Telehealth: Payer: Self-pay | Admitting: Cardiology

## 2020-04-03 DIAGNOSIS — Z954 Presence of other heart-valve replacement: Secondary | ICD-10-CM

## 2020-04-03 NOTE — Telephone Encounter (Signed)
Called the requesting office and spoke with [name] I asked if the dental work dental work will include extractions of teeth and if so how many? She stated that it will be a fill in and if an extraction is needed she will be referred out to an oral surgeon due to her health conditions and they have monitors for those concerns.

## 2020-04-03 NOTE — Telephone Encounter (Signed)
   [facility] Pre-operative Risk Assessment    HEARTCARE STAFF: - Please ensure there is not already an duplicate clearance open for this procedure. - Under Visit Info/Reason for Call, type in Other and utilize the format Clearance MM/DD/YY or Clearance TBD. Do not use dashes or single digits. - If request is for dental extraction, please clarify the # of teeth to be extracted.  Request for surgical clearance:  1. What type of surgery is being performed? Dental work; Psychologist, clinical  2. When is this surgery scheduled? TBD   3. What type of clearance is required (medical clearance vs. Pharmacy clearance to hold med vs. Both)? Both   4. Are there any medications that need to be held prior to surgery and how long? Up to Cardiology  5. Practice name and name of physician performing surgery? Dr. Gabriela Eves, [facility]  6. What is the office phone number? [phone]   7.   What is the office fax number? [place].   Anesthesia type (None, local, MAC, general) ? Local / novocaine   Patient will need an antibiotic to take for any type of dental work  [name] 04/03/2020, 11:53 AM  _________________________________________________________________   (provider comments below)

## 2020-04-03 NOTE — Telephone Encounter (Signed)
Continue potassium?

## 2020-04-03 NOTE — Telephone Encounter (Signed)
Left message on patients voicemail to please return our call.   

## 2020-04-03 NOTE — Telephone Encounter (Signed)
Please verify how many teeth will be involved.   Patient has a h/o bioprosthetic valve with subsequent leaflet thrombosis and TIA on coumadin.

## 2020-04-03 NOTE — Telephone Encounter (Signed)
Spoke to the patient just now and she let me know that her diuretic has been changed. She was taking Furosemide 40 mg daily. She is now taking Torsmide 60 mg daily.   She is wondering if she should continue to take her potassium 10 meq BID or not?  I will route to Dr. Bettina Gavia for further recommendations.

## 2020-04-03 NOTE — Telephone Encounter (Signed)
Pharm pool to verify no need to hold coumadin for filling, crown and simple extractions

## 2020-04-03 NOTE — Telephone Encounter (Signed)
Pt c/o medication issue:  1. Name of Medication:  torsemide (DEMADEX) 20 MG tablet potassium chloride (KLOR-CON) 10 MEQ tablet  2. How are you currently taking this medication (dosage and times per day)? 20 mg 3x daily   3. Are you having a reaction (difficulty breathing--STAT)? No   4. What is your medication issue? The patient's diuretic was changed by Dr. Aundra Dubin and she wanted to know if she still needed to continue with the potassium supplement

## 2020-04-03 NOTE — Telephone Encounter (Signed)
Spoke with patient and let her know that Dr. Bettina Gavia would like for her to continue on her potassium. She verbalizes understanding and thanks me for the call back.   Encouraged patient to call back with any questions or concerns.

## 2020-04-04 MED ORDER — AMOXICILLIN 500 MG PO CAPS: ORAL_CAPSULE | ORAL | 0 refills | Status: DC

## 2020-04-04 NOTE — Telephone Encounter (Signed)
Called and lmomed the patient to please come by this afternoon and to give me a call so that I can place her on my schedule. Will await callback

## 2020-04-04 NOTE — Telephone Encounter (Addendum)
Correction.  Patient DOES NOT have a fib.  Is on Coumadin for questionable leaflet thrombosis and small PFO.  Procedure: Dental work; Psychologist, clinical Date of procedure: TBD  CHADS2-VASc score of  8 (CHF, HTN, AGE, stroke/tia x 2, DM, CAD, female)  CrCl 99 mls/min using adjusted body weight Platelet count: 362  Patient does not need to hold warfarin for minor dental procedures  Patient has not had INR since 12/08/19  Patient will need preop antibiotics before procedure, recommend amoxicillin 2 grams PO 30-60 minutes prior to dental procedure.  Rx has been sent to pharmacy.  Please alert patient prescription has been sent in.

## 2020-04-04 NOTE — Telephone Encounter (Signed)
I called and spoke with patient, she is aware that Amoxicillin has be sent in to take 30-60 minutes before dental procedures. Patient aware RX has been sent in.

## 2020-04-04 NOTE — Telephone Encounter (Signed)
Patient has been called to schedule INR.  Message left.

## 2020-04-04 NOTE — Telephone Encounter (Signed)
   Primary Cardiologist: Shirlee More, MD  Chart reviewed as part of pre-operative protocol coverage.   SBE prophylaxis is/ required for the patient and a prescription for Amoxicillin 2 grams po 30-60 minutes pre procedure has been called in.  I will route this recommendation to the requesting party via Epic fax function and remove from pre-op pool.  Please call with questions.  Kerin Ransom, PA-C 04/04/2020, 2:58 PM

## 2020-04-04 NOTE — Telephone Encounter (Signed)
lmomed the pt to return our call so that we can get her scheduled for an inr check for dental clearance. Will await callback for pt

## 2020-04-05 ENCOUNTER — Telehealth: Payer: Self-pay

## 2020-04-05 NOTE — Telephone Encounter (Signed)
Called and lmomed the pt for overdur inr and that inr needed for cdental clearance. Will await phone call from pt

## 2020-04-07 ENCOUNTER — Telehealth: Payer: Self-pay | Admitting: Pharmacist

## 2020-04-07 NOTE — Telephone Encounter (Signed)
Patient calling back to talk to [facility]. Overdue to INR and needs appointment @[place] .

## 2020-04-10 NOTE — Telephone Encounter (Signed)
lmomed the pt to call to schedule coumadin appt

## 2020-04-11 ENCOUNTER — Other Ambulatory Visit: Payer: Self-pay

## 2020-04-11 ENCOUNTER — Ambulatory Visit (INDEPENDENT_AMBULATORY_CARE_PROVIDER_SITE_OTHER): Payer: Medicare Other

## 2020-04-11 DIAGNOSIS — Z954 Presence of other heart-valve replacement: Secondary | ICD-10-CM | POA: Diagnosis not present

## 2020-04-11 DIAGNOSIS — G459 Transient cerebral ischemic attack, unspecified: Secondary | ICD-10-CM

## 2020-04-11 DIAGNOSIS — Z5181 Encounter for therapeutic drug level monitoring: Secondary | ICD-10-CM | POA: Diagnosis not present

## 2020-04-11 DIAGNOSIS — Z7901 Long term (current) use of anticoagulants: Secondary | ICD-10-CM | POA: Diagnosis not present

## 2020-04-11 LAB — POCT INR: INR: 3 (ref 2.0–3.0)

## 2020-04-11 NOTE — Patient Instructions (Signed)
Continue taking 1/2 tablet daily except 1 tablet on Mondays, Wednesdays ,and Fridays.  Repeat INR in 4 week

## 2020-04-25 ENCOUNTER — Telehealth: Payer: Self-pay

## 2020-04-25 NOTE — Telephone Encounter (Signed)
Usual recommendations for dental procedures are fine.

## 2020-04-25 NOTE — Telephone Encounter (Signed)
Dr. Aundra Dubin You just saw her 03/2020 and f/u was planned for 1 month. We do not usually recommend any testing for dental extractions, etc and usually do not hold anticoagulation. Since she is a new eval for you, I wanted to get your input before we make our usual recommendations for dental procedures. Richardson Dopp, PA-C    04/25/2020 4:54 PM

## 2020-04-25 NOTE — Telephone Encounter (Signed)
   [facility] Pre-operative Risk Assessment    HEARTCARE STAFF: - Please ensure there is not already an duplicate clearance open for this procedure. - Under Visit Info/Reason for Call, type in Other and utilize the format Clearance MM/DD/YY or Clearance TBD. Do not use dashes or single digits. - If request is for dental extraction, please clarify the # of teeth to be extracted.  Request for surgical clearance:  1. What type of surgery is being performed? Dental crown, filling, extractions as needed, and back tooth with advanced periodontal disease needs to be cleaned out below the gumline with possible extraction.    2. When is this surgery scheduled? TBD   3. What type of clearance is required (medical clearance vs. Pharmacy clearance to hold med vs. Both)? Both  4. Are there any medications that need to be held prior to surgery and how long?None Specified   5. Practice name and name of physician performing surgery? [facility]. Dr. Gabriela Eves   6. What is the office phone number? [phone]   7.   What is the office fax number? [phone]  8.   Anesthesia type (None, local, MAC, general) ? Lidocaine   [name] 04/25/2020, 10:51 AM  _________________________________________________________________   (provider comments below)

## 2020-04-27 ENCOUNTER — Other Ambulatory Visit: Payer: Self-pay | Admitting: Cardiology

## 2020-04-27 NOTE — Telephone Encounter (Signed)
   Primary Cardiologist: Shirlee More, MD  Chart reviewed as part of pre-operative protocol coverage.   Simple dental extractions are considered low risk procedures per guidelines and generally do not require any specific cardiac clearance.  It is difficult to provide recommendation for holding Warfarin without knowing how many extractions patient will need. Discussed with Pharmacy: If she has 2-3 extractions, OK to hold Warfarin for about 3 days prior. She does not need bridging for this. Please restart Warfarin as soon as able after procedure.   SBE prophylaxis is  required for the patient. She already has a prescription for this.  I will route this recommendation to the requesting party via Epic fax function and remove from pre-op pool.  Please call with questions.  Darreld Mclean, PA-C 04/27/2020, 11:41 AM

## 2020-04-27 NOTE — Telephone Encounter (Addendum)
Pt was started on warfarin in Sept 2020 after question of leaflet thrombosis on CTA, however TEE on 06/07/19 was negative for thrombus and positive for small PFO.  Cards recommended anticoagulation for 3 months due to history of AVR in setting of TIA and possible Afib. Of note, AVR is bioprosthetic and was replaced in 2014.  Follow up monitor Dec 2020 revealed no episodes of afib.  Will forward to Dr Bettina Gavia regarding need for continued anticoagulation with warfarin at this time since TEE was negative for thrombus 11 months ago and monitor did not reveal any afib.  Of note, she will need pre op antibiotics for dental work given history of bioprosthetic valve.

## 2020-04-27 NOTE — Telephone Encounter (Signed)
Can we try to clarify how many dental extractions? Thank you!

## 2020-04-27 NOTE — Telephone Encounter (Signed)
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

## 2020-04-27 NOTE — Telephone Encounter (Signed)
Called the requesting office and I spoke with Traci she stated that 2 teeth with the possibility of more will not know for sure how many until they do the procedure patient has a lot going on with her teeth/mouth.

## 2020-04-27 NOTE — Telephone Encounter (Signed)
Pt will not need bridging with Lovenox for any invasive dental work, however it is hard to provide a recommendation for holding warfarin without knowing how many extractions she will have done. If she has 2 - 3 extractions, would be ok to hold warfarin for ~3 days prior. She'll need to take amoxicillin as well (already has rx on profile for this).

## 2020-04-27 NOTE — Telephone Encounter (Signed)
Pharmacy, can you please comment on if patient needs to hold Coumadin for upcoming dental procedure? I know we usually recommend continuing anticoagulation but she is going to have a least 2 teeth extracted (maybe more).  Thank you!

## 2020-05-05 ENCOUNTER — Encounter (HOSPITAL_COMMUNITY): Payer: Medicare Other

## 2020-05-05 ENCOUNTER — Other Ambulatory Visit (HOSPITAL_COMMUNITY): Payer: Medicare Other

## 2020-05-09 ENCOUNTER — Other Ambulatory Visit: Payer: Self-pay

## 2020-05-09 ENCOUNTER — Ambulatory Visit (INDEPENDENT_AMBULATORY_CARE_PROVIDER_SITE_OTHER): Payer: Medicare Other

## 2020-05-09 ENCOUNTER — Telehealth: Payer: Self-pay | Admitting: Cardiology

## 2020-05-09 DIAGNOSIS — I10 Essential (primary) hypertension: Secondary | ICD-10-CM

## 2020-05-09 DIAGNOSIS — G459 Transient cerebral ischemic attack, unspecified: Secondary | ICD-10-CM | POA: Diagnosis not present

## 2020-05-09 DIAGNOSIS — Z954 Presence of other heart-valve replacement: Secondary | ICD-10-CM

## 2020-05-09 DIAGNOSIS — Z5181 Encounter for therapeutic drug level monitoring: Secondary | ICD-10-CM | POA: Diagnosis not present

## 2020-05-09 DIAGNOSIS — Z7901 Long term (current) use of anticoagulants: Secondary | ICD-10-CM

## 2020-05-09 LAB — POCT INR: INR: 1 — AB (ref 2.0–3.0)

## 2020-05-09 NOTE — Telephone Encounter (Signed)
Spoke to the patient just now and she let me know that she was given an order for a BMP and she was wondering if we would be able to get it drawn today in our office. I let her know that this will be fine per Dr. Bettina Gavia and she can get it done after her coumadin appointment today. She verbalizes understanding.

## 2020-05-09 NOTE — Telephone Encounter (Signed)
New message:    Patient is calling to see if she bring in a prescription to some extra blood work today.

## 2020-05-09 NOTE — Patient Instructions (Signed)
Take 1.5 tablets today and then Continue taking 1/2 tablet daily except 1 tablet on Mondays, Wednesdays ,and Fridays.  Repeat INR in 2 weeks

## 2020-05-10 ENCOUNTER — Encounter (HOSPITAL_COMMUNITY)
Admission: RE | Admit: 2020-05-10 | Discharge: 2020-05-10 | Disposition: A | Discharge status: Home / Self Care | Payer: Medicare Other | Source: Ambulatory Visit | Attending: Cardiology | Admitting: Cardiology

## 2020-05-10 ENCOUNTER — Telehealth: Payer: Self-pay

## 2020-05-10 DIAGNOSIS — I5032 Chronic diastolic (congestive) heart failure: Secondary | ICD-10-CM | POA: Insufficient documentation

## 2020-05-10 LAB — BASIC METABOLIC PANEL
BUN/Creatinine Ratio: 13 (ref 12–28)
BUN: 10 mg/dL (ref 8–27)
CO2: 26 mmol/L (ref 20–29)
Calcium: 10.7 mg/dL — ABNORMAL HIGH (ref 8.7–10.3)
Chloride: 102 mmol/L (ref 96–106)
Creatinine, Ser: 0.75 mg/dL (ref 0.57–1.00)
GFR calc Af Amer: 97 mL/min/{1.73_m2} (ref >59)
GFR calc non Af Amer: 84 mL/min/{1.73_m2} (ref >59)
Glucose: 100 mg/dL — ABNORMAL HIGH (ref 65–99)
Potassium: 4.5 mmol/L (ref 3.5–5.2)
Sodium: 141 mmol/L (ref 134–144)

## 2020-05-10 IMAGING — NM NM SCAN TUMOR LOCALIZE WITH SPECT
4 series · 19 of 19 positions shown · non-contrast
Comparison: none

CLINICAL DATA: HEART FAILURE. CARDIOMYOPATHY CONCERN FOR CARDIAC
AMYLOIDOSIS.

EXAM:
NUCLEAR MEDICINE TUMOR LOCALIZATION. PYP CARDIAC AMYLOIDOSIS SCAN
WITH SPECT
TECHNIQUE: Following intravenous administration of radiopharmaceutical,
anterior planar images of the chest were obtained. Regions of
interest were placed on the heart and contralateral chest wall for
quantitative assessment. Additional SPECT imaging of the chest was
obtained.
RADIOPHARMACEUTICALS:  19.5 mCi TECHNETIUM 99 PYROPHOSPHATE

[Series 1: anterior · 4.14mm/px · 1 of 1 slices shown]
[im 1/1]
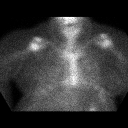

[Series 1: spect - (id)_(id)_cor · 4.1mm · 4.14mm/px · 6 of 128 frames shown (1 of 2)]
[frame 11/128]
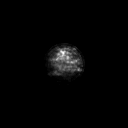
[frame 32/128]
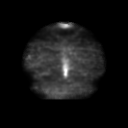
[frame 54/128]
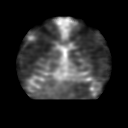
[frame 75/128]
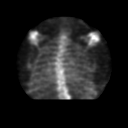
[frame 96/128]
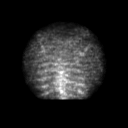
[frame 118/128]
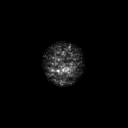

[Series 1: spect - (id)_(id)_cor · 4.1mm · 4.14mm/px · 6 of 128 frames shown (2 of 2)]
[frame 11/128]
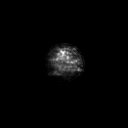
[frame 32/128]
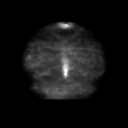
[frame 54/128]
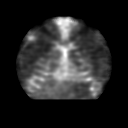
[frame 75/128]
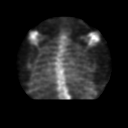
[frame 96/128]
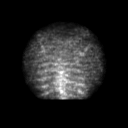
[frame 118/128]
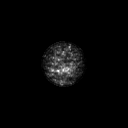

[Series 2: amyloid spect · 4.14mm/px · 6 of 64 frames shown]
[frame 6/64  full-range]
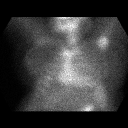
[frame 16/64  full-range]
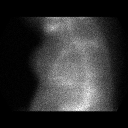
[frame 27/64  full-range]
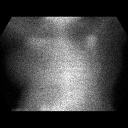
[frame 38/64  full-range]
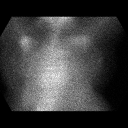
[frame 48/64  full-range]
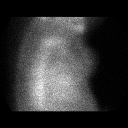
[frame 59/64  full-range]
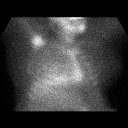

[19 of 19 positions shown; findings below may reference images not displayed]

FINDINGS: Planar Visual assessment:

Anterior planar imaging demonstrates no appreciable radiotracer
uptake within the heart (grade 0).

Quantitative assessment :

Quantitative assessment of the cardiac uptake compared to the
contralateral chest wall is equal to 1.03 (H/CL = 1.03) .

SPECT assessment: SPECT imaging of the chest demonstrates no
significant radiotracer accumulation within the LEFT ventricle.
IMPRESSION: Visual and quantitative assessment (grade 0, H/CLL equal 1.03) is
not suggestive of transthyretin amyloidosis.

## 2020-05-10 MED ORDER — TECHNETIUM TC 99M PYROPHOSPHATE
21.0000 | Freq: Once | INTRAVENOUS | Status: AC | PRN
  Administered 2020-05-10: 21 via INTRAVENOUS
  Filled 2020-05-10: qty 21

## 2020-05-10 NOTE — Telephone Encounter (Signed)
Spoke with patient regarding results and recommendation.  Patient verbalizes understanding and is agreeable to plan of care. Advised patient to call back with any issues or concerns.  

## 2020-05-10 NOTE — Telephone Encounter (Signed)
-----   Message from Richardo Priest, MD sent at 05/10/2020  7:59 AM EDT ----- Normal result no changes

## 2020-05-23 ENCOUNTER — Other Ambulatory Visit: Payer: Self-pay

## 2020-05-23 ENCOUNTER — Ambulatory Visit (INDEPENDENT_AMBULATORY_CARE_PROVIDER_SITE_OTHER): Payer: Medicare Other

## 2020-05-23 DIAGNOSIS — Z954 Presence of other heart-valve replacement: Secondary | ICD-10-CM | POA: Diagnosis not present

## 2020-05-23 DIAGNOSIS — Z5181 Encounter for therapeutic drug level monitoring: Secondary | ICD-10-CM | POA: Diagnosis not present

## 2020-05-23 DIAGNOSIS — G459 Transient cerebral ischemic attack, unspecified: Secondary | ICD-10-CM | POA: Diagnosis not present

## 2020-05-23 DIAGNOSIS — Z7901 Long term (current) use of anticoagulants: Secondary | ICD-10-CM | POA: Diagnosis not present

## 2020-05-23 LAB — POCT INR: INR: 1.5 — AB (ref 2.0–3.0)

## 2020-05-23 NOTE — Patient Instructions (Signed)
Take 1.5 tablets today and then increase to 1 tablet daily except 0.5 tablet on Sunday, Tuesday and Thursday.  Repeat INR in 2 weeks

## 2020-06-05 DIAGNOSIS — I509 Heart failure, unspecified: Secondary | ICD-10-CM | POA: Insufficient documentation

## 2020-06-05 DIAGNOSIS — Q231 Congenital insufficiency of aortic valve: Secondary | ICD-10-CM | POA: Insufficient documentation

## 2020-06-05 NOTE — Progress Notes (Signed)
Cardiology Office Note:    Date:  06/06/2020   ID:  [name], DOB 1956-08-30, MRN [id number]  PCP:  [name], MD  Cardiologist:  Shirlee More, MD    Referring MD: [name], MD    ASSESSMENT:    1. H/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   2. Long term (current) use of anticoagulants   3. Hypertensive heart disease with heart failure ([place])   4. OSA (obstructive sleep apnea)   5. Other iron deficiency anemia    PLAN:    In order of problems listed above:  1. Stable with TIA and abnormal imaging she remains on long-term warfarin. 2. Continue warfarin goal INR 2.5 3. Improved continue her current torsemide along with increased dose of beta-blocker 4. Stable tolerate positive pressure CPAP at nighttime 5. Improved she is followed up recently with GYN oncology and [place] I cannot see the labs she tells me her hemoglobin is 11.9 and she has finished iron infusions   Next appointment: 6 months, she also has follow-up with the heart failure program in [place]   Medication Adjustments/Labs and Tests Ordered: Current medicines are reviewed at length with the patient today.  Concerns regarding medicines are outlined above.  No orders of the defined types were placed in this encounter.  No orders of the defined types were placed in this encounter.   Chief Complaint  Patient presents with  . Follow-up  . Congestive Heart Failure  . Atrial Fibrillation  . Anticoagulation    History of Present Illness:    [name] is a 64 y.o. female with a hx of severe symptomatic aortic stenosis with heart failure and underwent 23 mm [place] SAVR on 03/30/13 Dr Clementeen Graham at [facility] , hypertension and heart failure. Recent TEE showed dynamic LVOT obstruction and normal AVR function. She was  diagnosed with  severe obstructive and central sleep apnea with nocturnal hypoxemia nadir 59% and initiated on CPAP which was changed to BiPAP.  Her other problems include  nonhealing leg ulcers and dysfunctional uterine bleeding pending D&C.    She was admitted to [facility] with chest pain 03/30/2019.  Coronary angiography was normal.  She underwent left and right heart catheterization showing elevated left atrial pressure and increased gradient across her valve.  She previously underwent transesophageal echocardiogram and was found to have dynamic LV outflow tract obstruction.   She was admitted to [facility] 06/09/2019 with TIA underwent evaluation with transesophageal echocardiogram showing normal AVR function but TAVR protocol cardiac CTA implied leaflet thrombosis despite her severe anemia with dysfunctional uterine bleeding she was anticoagulated with warfarin.  Her hospital telemetry showed frequent PACs and brief runs of atrial tachycardia she was also noted to have a patent foramen ovale.  She follows with gynecology oncology and has had her iron stores repleted.  She was last seen 12/23/2019.  She underwent cardiac amyloid nuclear medicine imaging at her request 05/10/2020 with no findings of T TR amyloidosis no findings of monoclonal gammopathy.  She is seen in advanced heart failure furosemide was discontinued was transitioned to torsemide her beta-blocker dose was increased  Compliance with diet, lifestyle and medications: Yes  With change in diuretics her weight is down with somewhere in the range of 5 to 10 pounds.  Unfortunately continues to have drainage from venous ulcers left lower extremity.  Dentist inquired about stopping her anticoagulant for dental extraction I told him no and I told her if they  are uncomfortable doing it to see an oral surgeon.  Overall she feels better is not short of breath no palpitation or syncope and has less edema.  She complains of intermittent visual loss and I told her she needs to see an ophthalmologist.  Long-term she needs to remain on anticoagulant. Past Medical History:  Diagnosis Date  . Anemia  06/03/2019  . Aortic stenosis 03/23/2013   Overview:  02/18/13 TTE EF >55%. Critical AS with mean Ao valve gradient of 82 mm Hg. No AI. No MR, PR, mild TR. Estimated RVSP 30 mm Hg.  . Bicuspid aortic valve   . CAD (coronary artery disease) 06/03/2019  . Chronic diastolic CHF (congestive heart failure) ([place]) 06/03/2019  . Dysfunctional uterine bleeding 04/22/2018  . Edema of both legs 02/20/2017  . Encounter for insertion of mirena IUD 08/17/2019   Mirena IUD / 52mg  Levonorgestrel Insertion Date: 08/17/19 S/N: [id number] Exp: 1/23 Lot: [id number]  . Essential hypertension 06/03/2019  . 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 Heart failure ([place])   . HTN (hypertension) 03/23/2013  . 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 Hypertensive heart disease with heart failure ([place]) 03/23/2013  . Iron deficiency anemia due to chronic blood loss 10/19/2019  . Long term (current) use of anticoagulants 06/11/2019  . Morbid obesity ([place]) 02/20/2017  . Obstructive hypertrophic cardiomyopathy ([place]) 07/10/2018  . Postmenopausal bleeding 04/16/2018   Added automatically from request for surgery [id number]  Last Assessment & Plan:  1. Postmenopausal bleeding since for the past 2 years.  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 Last biopsy was in May of 2019 which showed weakly proliferative endometrium possibly a polyp.    . Sleep apnea   . TIA (transient ischemic attack) 06/04/2019  . Transient neurologic deficit 06/03/2019    Past Surgical History:  Procedure Laterality Date  . BUBBLE STUDY  06/07/2019   Procedure: BUBBLE STUDY;  Surgeon: Buford Dresser, MD;  Location: [place];  Service: Cardiovascular;;  . CHOLECYSTECTOMY  1983  . RIGHT/LEFT HEART CATH AND CORONARY ANGIOGRAPHY N/A 04/01/2019   Procedure: RIGHT/LEFT HEART CATH AND CORONARY ANGIOGRAPHY;  Surgeon: Lorretta Harp, MD;   Location: [place] CV LAB;  Service: Cardiovascular;  Laterality: N/A;  . TEE WITHOUT CARDIOVERSION N/A 07/01/2018   Procedure: TRANSESOPHAGEAL ECHOCARDIOGRAM (TEE);  Surgeon: Buford Dresser, MD;  Location[facility] ENDOSCOPY;  Service: Cardiovascular;  Laterality: N/A;  . TEE WITHOUT CARDIOVERSION N/A 06/07/2019   Procedure: TRANSESOPHAGEAL ECHOCARDIOGRAM (TEE);  Surgeon: Buford Dresser, MD;  Location: [facility] ENDOSCOPY;  Service: Cardiovascular;  Laterality: N/A;  . TISSUE AORTIC VALVE REPLACEMENT      Current Medications: Current Meds  Medication Sig  . camphor-menthol (SARNA) lotion Apply 1 application topically as needed for itching.  Marland Kitchen levonorgestrel (MIRENA) 20 MCG/24HR IUD 1 each by Intrauterine route once.  . metoprolol succinate (TOPROL XL) 25 MG 24 hr tablet Take 2 tablets (50 mg total) by mouth in the morning AND 1 tablet (25 mg total) every evening.  Marland Kitchen MYRBETRIQ 25 MG TB24 tablet Take 25 mg by mouth daily.  . potassium chloride (KLOR-CON) 10 MEQ tablet Take 1 tablet (10 mEq total) by mouth 2 (two) times daily.  . pravastatin (PRAVACHOL) 40 MG tablet Take 1 tablet (40 mg total) by mouth daily at 6 PM.  . torsemide (DEMADEX) 20 MG tablet Take 3 tablets (60 mg total) by mouth daily.  Marland Kitchen  triamcinolone cream (KENALOG) 0.1 % APPLY THIN FILM OF CREAM OVER THE LEG WITH EACH WRAP CHANGE. NOT TO BE PLACED IN THE WOUNDS  . warfarin (COUMADIN) 2.5 MG tablet Take 1 tablet by mouth once daily     Allergies:   Vancomycin   Social History   Socioeconomic History  . Marital status: Married    Spouse name: Not on file  . Number of children: Not on file  . Years of education: Not on file  . Highest education level: Not on file  Occupational History  . Not on file  Tobacco Use  . Smoking status: Never Smoker  . Smokeless tobacco: Never Used  Vaping Use  . Vaping Use: Never used  Substance and Sexual Activity  . Alcohol use: No  . Drug use: No  . Sexual activity: Not  Currently  Other Topics Concern  . Not on file  Social History Narrative  . Not on file   Social Determinants of Health   Financial Resource Strain:   . Difficulty of Paying Living Expenses: Not on file  Food Insecurity:   . Worried About Charity fundraiser in the Last Year: Not on file  . Ran Out of Food in the Last Year: Not on file  Transportation Needs:   . Lack of Transportation (Medical): Not on file  . Lack of Transportation (Non-Medical): Not on file  Physical Activity:   . Days of Exercise per Week: Not on file  . Minutes of Exercise per Session: Not on file  Stress:   . Feeling of Stress : Not on file  Social Connections:   . Frequency of Communication with Friends and Family: Not on file  . Frequency of Social Gatherings with Friends and Family: Not on file  . Attends Religious Services: Not on file  . Active Member of Clubs or Organizations: Not on file  . Attends Archivist Meetings: Not on file  . Marital Status: Not on file     Family History: The patient's family history includes Heart attack in her mother; Parkinson's disease in her father. ROS:   Please see the history of present illness.    All other systems reviewed and are negative.  EKGs/Labs/Other Studies Reviewed:    The following studies were reviewed today:  EKG:  EKG ordered today and personally reviewed.  The ekg ordered today demonstrates sinus rhythm normal except for minor nonspecific T wave changes  Recent Labs:  11/22/2019 [facility] hemoglobin 10.9 after IV iron repletion 06/08/2019: Magnesium 2.2 09/14/2019: Hemoglobin 8.8; Platelets 362 09/15/2019: B Natriuretic Peptide 149.4 05/09/2020: BUN 10; Creatinine, Ser 0.75; Potassium 4.5; Sodium 141  Recent Lipid Panel    Component Value Date/Time   CHOL 123 06/04/2019 0507   CHOL 125 02/12/2019 1116   TRIG 111 06/04/2019 0507   HDL 30 (L) 06/04/2019 0507   HDL 37 (L) 02/12/2019 1116   CHOLHDL 4.1 06/04/2019  0507   VLDL 22 06/04/2019 0507   LDLCALC 71 06/04/2019 0507   LDLCALC 64 02/12/2019 1116    Physical Exam:    VS:  BP 117/68   Pulse 75   Ht 5\' 1"  (1.549 m)   Wt 271 lb 3.2 oz (123 kg)   SpO2 (!) 78%   BMI 51.24 kg/m     Wt Readings from Last 3 Encounters:  06/06/20 271 lb 3.2 oz (123 kg)  03/28/20 279 lb (126.6 kg)  12/23/19 265 lb (120.2 kg)  GEN: Marked obesity weight greater than 50 well nourished, well developed in no acute distress HEENT: Normal NECK: No JVD; No carotid bruits LYMPHATICS: No lymphadenopathy CARDIAC: Grade 2/6 midsystolic ejection murmur diminished from baseline no AR RRR, no rubs, gallops RESPIRATORY:  Clear to auscultation without rales, wheezing or rhonchi  ABDOMEN: Soft, non-tender, non-distended MUSCULOSKELETAL:  No edema; No deformity  SKIN: Warm and dry NEUROLOGIC:  Alert and oriented x 3 PSYCHIATRIC:  Normal affect    Signed, Shirlee More, MD  06/06/2020 8:56 AM    [place]

## 2020-06-06 ENCOUNTER — Ambulatory Visit (INDEPENDENT_AMBULATORY_CARE_PROVIDER_SITE_OTHER): Payer: Medicare Other

## 2020-06-06 ENCOUNTER — Telehealth: Payer: Self-pay | Admitting: Cardiology

## 2020-06-06 ENCOUNTER — Encounter: Payer: Self-pay | Admitting: Cardiology

## 2020-06-06 ENCOUNTER — Ambulatory Visit (INDEPENDENT_AMBULATORY_CARE_PROVIDER_SITE_OTHER): Payer: Medicare Other | Admitting: Cardiology

## 2020-06-06 ENCOUNTER — Other Ambulatory Visit: Payer: Self-pay

## 2020-06-06 VITALS — BP 117/68 | HR 75 | Ht 61.0 in | Wt 271.2 lb

## 2020-06-06 DIAGNOSIS — G4733 Obstructive sleep apnea (adult) (pediatric): Secondary | ICD-10-CM

## 2020-06-06 DIAGNOSIS — Z7901 Long term (current) use of anticoagulants: Secondary | ICD-10-CM

## 2020-06-06 DIAGNOSIS — Z954 Presence of other heart-valve replacement: Secondary | ICD-10-CM | POA: Diagnosis not present

## 2020-06-06 DIAGNOSIS — G459 Transient cerebral ischemic attack, unspecified: Secondary | ICD-10-CM

## 2020-06-06 DIAGNOSIS — I110 Hypertensive heart disease with heart failure: Secondary | ICD-10-CM | POA: Diagnosis not present

## 2020-06-06 DIAGNOSIS — Z5181 Encounter for therapeutic drug level monitoring: Secondary | ICD-10-CM

## 2020-06-06 DIAGNOSIS — D508 Other iron deficiency anemias: Secondary | ICD-10-CM

## 2020-06-06 LAB — POCT INR: INR: 7.3 — AB (ref 2.0–3.0)

## 2020-06-06 NOTE — Patient Instructions (Signed)
Hold today, Wednesday and Thursday and then continue 1 tablet daily except 0.5 tablet on Sunday, Tuesday and Thursday.  Repeat INR in 1 week

## 2020-06-06 NOTE — Telephone Encounter (Signed)
Deanna from [facility] called and wanted to know if we received lab orders that needed signing by doctor. Please call

## 2020-06-06 NOTE — Telephone Encounter (Signed)
Called Deanna back and left a voicemail letting her know that we do not have these papers to fill out at this time. I gave her our fax number to fax them to.

## 2020-06-06 NOTE — Patient Instructions (Signed)
Medication Instructions:  Your physician recommends that you continue on your current medications as directed. Please refer to the Current Medication list given to you today.  *If you need a refill on your cardiac medications before your next appointment, please call your pharmacy*   Lab Work: Your physician recommends that you return for lab work in: TODAY BMP, ProBNP If you have labs (blood work) drawn today and your tests are completely normal, you will receive your results only by: Marland Kitchen MyChart Message (if you have MyChart) OR . A paper copy in the mail If you have any lab test that is abnormal or we need to change your treatment, we will call you to review the results.   Testing/Procedures: None   Follow-Up: At [facility], you and your health needs are our priority.  As part of our continuing mission to provide you with exceptional heart care, we have created designated Provider Care Teams.  These Care Teams include your primary Cardiologist (physician) and Advanced Practice Providers (APPs -  Physician Assistants and Nurse Practitioners) who all work together to provide you with the care you need, when you need it.  We recommend signing up for the patient portal called "MyChart".  Sign up information is provided on this After Visit Summary.  MyChart is used to connect with patients for Virtual Visits (Telemedicine).  Patients are able to view lab/test results, encounter notes, upcoming appointments, etc.  Non-urgent messages can be sent to your provider as well.   To learn more about what you can do with MyChart, go to [email or web address].    Your next appointment:   6 month(s)  The format for your next appointment:   In Person  Provider:   Shirlee More, MD   Other Instructions Please see your ophthalmologist.

## 2020-06-07 ENCOUNTER — Other Ambulatory Visit: Payer: Self-pay | Admitting: Internal Medicine

## 2020-06-07 ENCOUNTER — Telehealth: Payer: Self-pay | Admitting: Cardiology

## 2020-06-07 DIAGNOSIS — R791 Abnormal coagulation profile: Secondary | ICD-10-CM

## 2020-06-07 LAB — BASIC METABOLIC PANEL
BUN/Creatinine Ratio: 14 (ref 12–28)
BUN: 13 mg/dL (ref 8–27)
CO2: 26 mmol/L (ref 20–29)
Calcium: 10.1 mg/dL (ref 8.7–10.3)
Chloride: 97 mmol/L (ref 96–106)
Creatinine, Ser: 0.94 mg/dL (ref 0.57–1.00)
GFR calc Af Amer: 74 mL/min/{1.73_m2} (ref >59)
GFR calc non Af Amer: 64 mL/min/{1.73_m2} (ref >59)
Glucose: 110 mg/dL — ABNORMAL HIGH (ref 65–99)
Potassium: 4.1 mmol/L (ref 3.5–5.2)
Sodium: 140 mmol/L (ref 134–144)

## 2020-06-07 LAB — PRO B NATRIURETIC PEPTIDE: NT-Pro BNP: 1079 pg/mL — ABNORMAL HIGH (ref 0–301)

## 2020-06-07 NOTE — Telephone Encounter (Signed)
° ° °  Pt is returning Hayley's call to get lab results

## 2020-06-07 NOTE — Telephone Encounter (Signed)
Spoke with patient regarding results and recommendation.  Patient verbalizes understanding and is agreeable to plan of care. Advised patient to call back with any issues or concerns.  

## 2020-06-13 ENCOUNTER — Other Ambulatory Visit: Payer: Self-pay

## 2020-06-13 ENCOUNTER — Ambulatory Visit (INDEPENDENT_AMBULATORY_CARE_PROVIDER_SITE_OTHER): Payer: Medicare Other

## 2020-06-13 DIAGNOSIS — G459 Transient cerebral ischemic attack, unspecified: Secondary | ICD-10-CM

## 2020-06-13 DIAGNOSIS — Z954 Presence of other heart-valve replacement: Secondary | ICD-10-CM

## 2020-06-13 DIAGNOSIS — Z7901 Long term (current) use of anticoagulants: Secondary | ICD-10-CM

## 2020-06-13 DIAGNOSIS — Z5181 Encounter for therapeutic drug level monitoring: Secondary | ICD-10-CM

## 2020-06-13 LAB — POCT INR: INR: 3.2 — AB (ref 2.0–3.0)

## 2020-06-13 NOTE — Patient Instructions (Signed)
Continue 1 tablet daily except 0.5 tablet on Sunday, Tuesday and Thursday.  Repeat INR in 4 weeks Eat a salad next few days.

## 2020-07-11 ENCOUNTER — Ambulatory Visit (INDEPENDENT_AMBULATORY_CARE_PROVIDER_SITE_OTHER): Payer: Medicare Other

## 2020-07-11 ENCOUNTER — Other Ambulatory Visit: Payer: Self-pay

## 2020-07-11 DIAGNOSIS — G459 Transient cerebral ischemic attack, unspecified: Secondary | ICD-10-CM

## 2020-07-11 DIAGNOSIS — Z5181 Encounter for therapeutic drug level monitoring: Secondary | ICD-10-CM

## 2020-07-11 DIAGNOSIS — Z7901 Long term (current) use of anticoagulants: Secondary | ICD-10-CM

## 2020-07-11 DIAGNOSIS — Z954 Presence of other heart-valve replacement: Secondary | ICD-10-CM

## 2020-07-11 LAB — POCT INR: INR: 3.2 — AB (ref 2.0–3.0)

## 2020-07-11 NOTE — Patient Instructions (Signed)
Take 0.5 tablet daily except 1 tablet on Monday, Wednesday and Friday.  Repeat INR in 4 weeks.  

## 2020-07-12 ENCOUNTER — Telehealth: Payer: Self-pay | Admitting: Cardiology

## 2020-07-12 NOTE — Telephone Encounter (Signed)
LMOM for patient.  She should not take vitamin K to help with the absorption of vitamin D.  She can take it with a meal to help with absorption, but do not take the vitamin K

## 2020-07-12 NOTE — Telephone Encounter (Signed)
Patient wanted to start taking Vitamin D. She read that to make the Vitamin D absorb better it is ideal to take Vitamin K with the Vitamin D. Due to the patient being on Coumadin she wanted to know if she should still take the Vitamin K. Please advise

## 2020-07-14 ENCOUNTER — Ambulatory Visit: Payer: Medicare Other | Admitting: Cardiology

## 2020-08-15 ENCOUNTER — Other Ambulatory Visit: Payer: Self-pay

## 2020-08-15 ENCOUNTER — Ambulatory Visit (INDEPENDENT_AMBULATORY_CARE_PROVIDER_SITE_OTHER): Payer: Medicare Other

## 2020-08-15 DIAGNOSIS — Z7901 Long term (current) use of anticoagulants: Secondary | ICD-10-CM

## 2020-08-15 DIAGNOSIS — G459 Transient cerebral ischemic attack, unspecified: Secondary | ICD-10-CM

## 2020-08-15 DIAGNOSIS — Z954 Presence of other heart-valve replacement: Secondary | ICD-10-CM

## 2020-08-15 LAB — POCT INR: INR: 3.2 — AB (ref 2.0–3.0)

## 2020-08-15 NOTE — Patient Instructions (Signed)
Take 0.5 tablet daily except 1 tablet on Monday, Wednesday and Friday.  Repeat INR in 6 weeks.  Eat greens over the next 2 days.

## 2020-09-05 ENCOUNTER — Other Ambulatory Visit: Payer: Self-pay

## 2020-09-05 ENCOUNTER — Inpatient Hospital Stay (HOSPITAL_COMMUNITY)
Admission: EM | Admit: 2020-09-05 | Discharge: 2020-09-12 | DRG: 872 | Disposition: A | Discharge status: Skilled Nursing Facility | Payer: Medicare Other | Attending: Internal Medicine | Admitting: Internal Medicine

## 2020-09-05 ENCOUNTER — Emergency Department (HOSPITAL_COMMUNITY): Payer: Medicare Other

## 2020-09-05 DIAGNOSIS — Z820 Family history of epilepsy and other diseases of the nervous system: Secondary | ICD-10-CM

## 2020-09-05 DIAGNOSIS — I110 Hypertensive heart disease with heart failure: Secondary | ICD-10-CM | POA: Diagnosis present

## 2020-09-05 DIAGNOSIS — Z953 Presence of xenogenic heart valve: Secondary | ICD-10-CM

## 2020-09-05 DIAGNOSIS — R7303 Prediabetes: Secondary | ICD-10-CM

## 2020-09-05 DIAGNOSIS — I890 Lymphedema, not elsewhere classified: Secondary | ICD-10-CM | POA: Diagnosis present

## 2020-09-05 DIAGNOSIS — R600 Localized edema: Secondary | ICD-10-CM | POA: Diagnosis present

## 2020-09-05 DIAGNOSIS — Z20822 Contact with and (suspected) exposure to covid-19: Secondary | ICD-10-CM | POA: Diagnosis present

## 2020-09-05 DIAGNOSIS — Z6841 Body Mass Index (BMI) 40.0 and over, adult: Secondary | ICD-10-CM

## 2020-09-05 DIAGNOSIS — I421 Obstructive hypertrophic cardiomyopathy: Secondary | ICD-10-CM | POA: Diagnosis present

## 2020-09-05 DIAGNOSIS — L03116 Cellulitis of left lower limb: Secondary | ICD-10-CM

## 2020-09-05 DIAGNOSIS — Z8673 Personal history of transient ischemic attack (TIA), and cerebral infarction without residual deficits: Secondary | ICD-10-CM

## 2020-09-05 DIAGNOSIS — Z7901 Long term (current) use of anticoagulants: Secondary | ICD-10-CM

## 2020-09-05 DIAGNOSIS — T796XXA Traumatic ischemia of muscle, initial encounter: Secondary | ICD-10-CM

## 2020-09-05 DIAGNOSIS — Z881 Allergy status to other antibiotic agents status: Secondary | ICD-10-CM

## 2020-09-05 DIAGNOSIS — M25551 Pain in right hip: Secondary | ICD-10-CM | POA: Diagnosis present

## 2020-09-05 DIAGNOSIS — A419 Sepsis, unspecified organism: Secondary | ICD-10-CM | POA: Diagnosis not present

## 2020-09-05 DIAGNOSIS — I5032 Chronic diastolic (congestive) heart failure: Secondary | ICD-10-CM | POA: Diagnosis present

## 2020-09-05 DIAGNOSIS — I2510 Atherosclerotic heart disease of native coronary artery without angina pectoris: Secondary | ICD-10-CM | POA: Diagnosis present

## 2020-09-05 DIAGNOSIS — E871 Hypo-osmolality and hyponatremia: Secondary | ICD-10-CM | POA: Diagnosis present

## 2020-09-05 DIAGNOSIS — Z8249 Family history of ischemic heart disease and other diseases of the circulatory system: Secondary | ICD-10-CM

## 2020-09-05 DIAGNOSIS — M6282 Rhabdomyolysis: Secondary | ICD-10-CM | POA: Diagnosis present

## 2020-09-05 DIAGNOSIS — W19XXXA Unspecified fall, initial encounter: Secondary | ICD-10-CM

## 2020-09-05 DIAGNOSIS — E1165 Type 2 diabetes mellitus with hyperglycemia: Secondary | ICD-10-CM | POA: Diagnosis not present

## 2020-09-05 DIAGNOSIS — D649 Anemia, unspecified: Secondary | ICD-10-CM | POA: Diagnosis present

## 2020-09-05 DIAGNOSIS — Z954 Presence of other heart-valve replacement: Secondary | ICD-10-CM

## 2020-09-05 DIAGNOSIS — Z79899 Other long term (current) drug therapy: Secondary | ICD-10-CM

## 2020-09-05 DIAGNOSIS — R52 Pain, unspecified: Secondary | ICD-10-CM

## 2020-09-05 DIAGNOSIS — M25561 Pain in right knee: Secondary | ICD-10-CM | POA: Diagnosis present

## 2020-09-05 DIAGNOSIS — W06XXXA Fall from bed, initial encounter: Secondary | ICD-10-CM | POA: Diagnosis present

## 2020-09-05 IMAGING — CR DG TIBIA/FIBULA 2V*L*
4 series · 4 of 4 positions shown · non-contrast
Comparison: None.

CLINICAL DATA: Left leg swelling

EXAM:
LEFT TIBIA AND FIBULA - 2 VIEW

[tibia ap (1 of 2)]
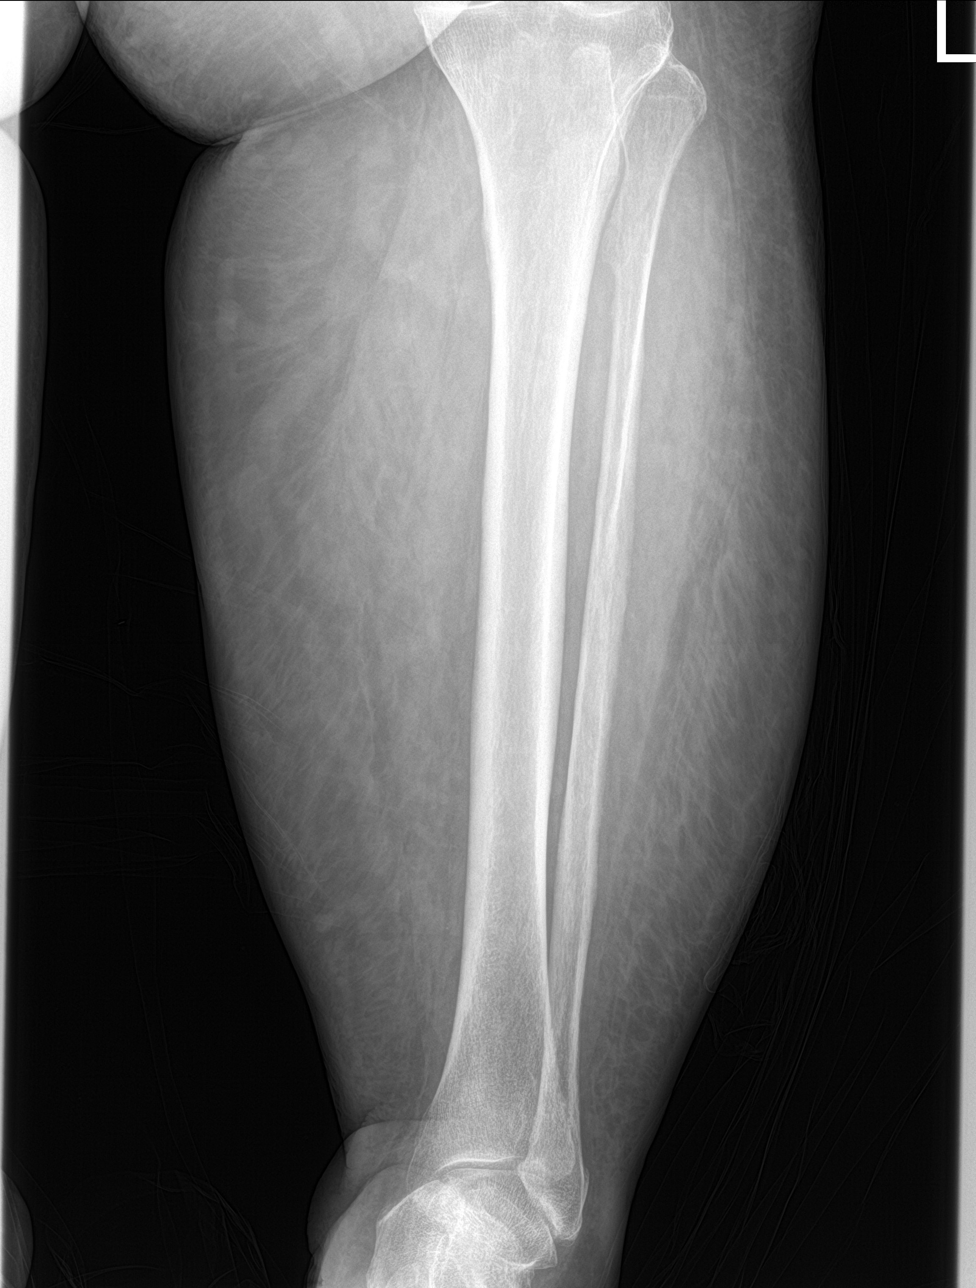

[tibia ap (2 of 2)]
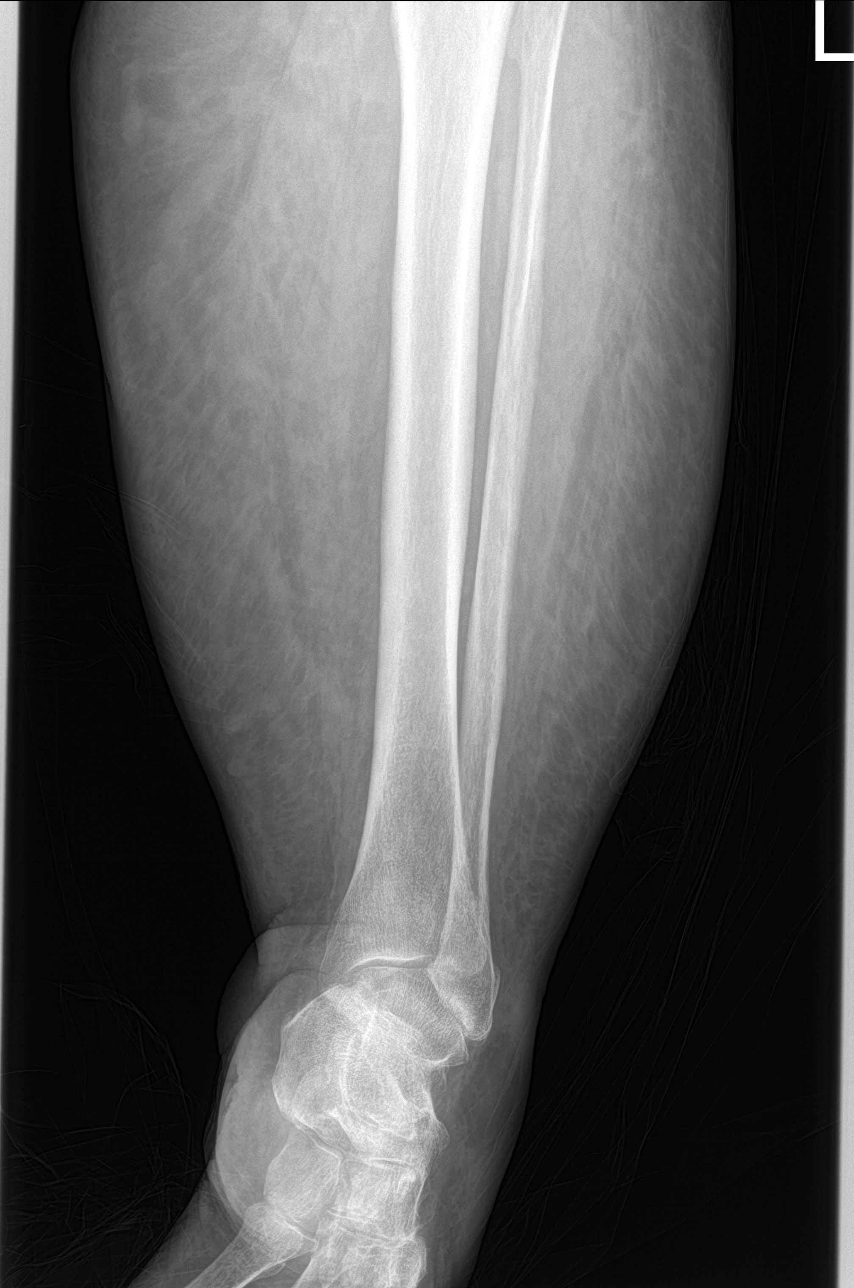

[tibia lat (1 of 2)]
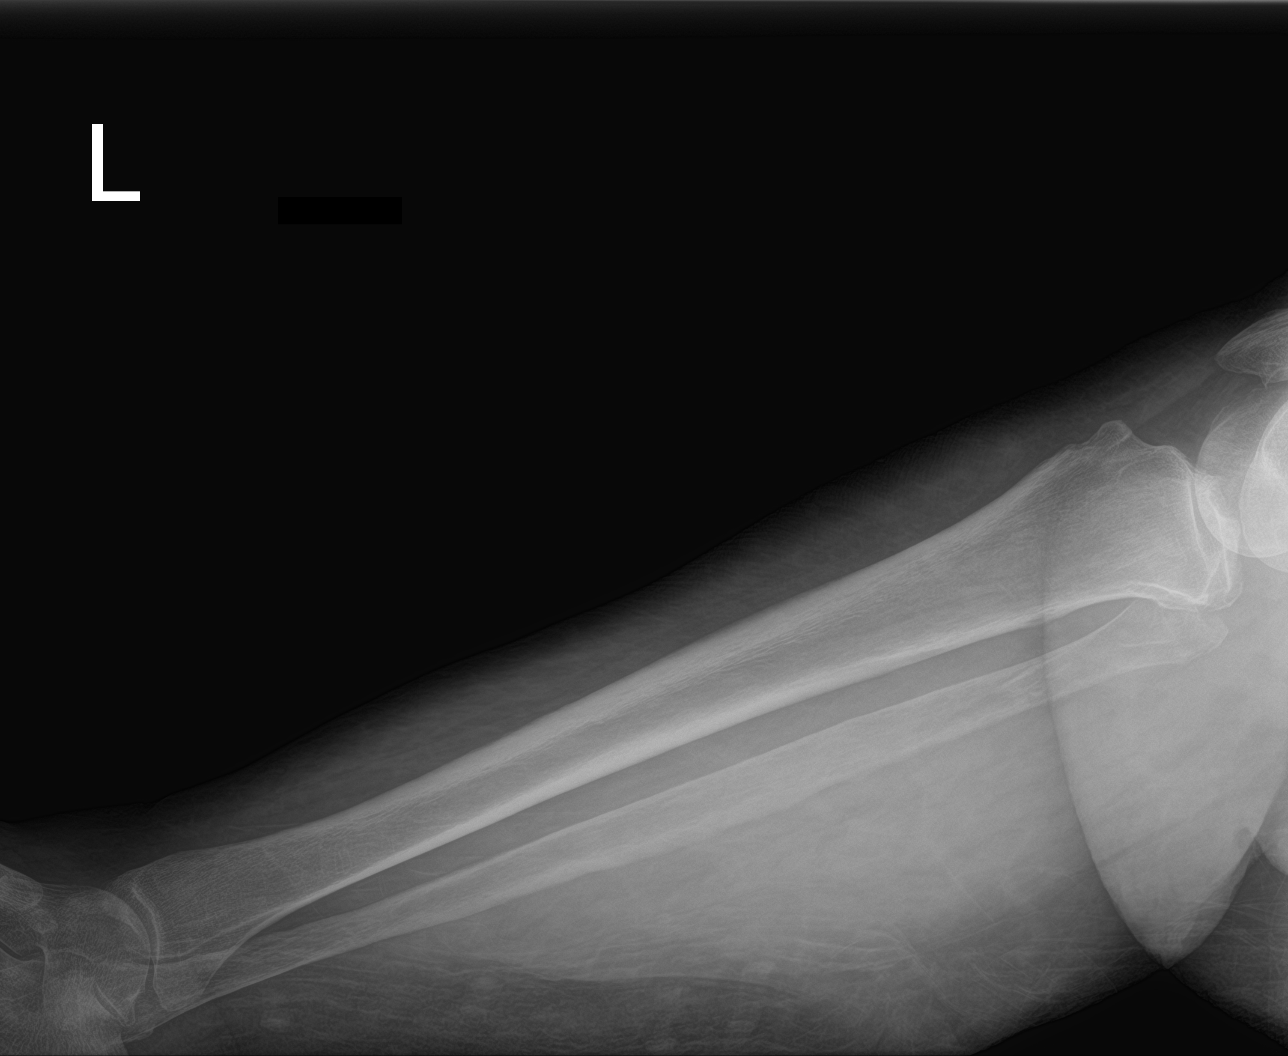

[tibia lat (2 of 2)]
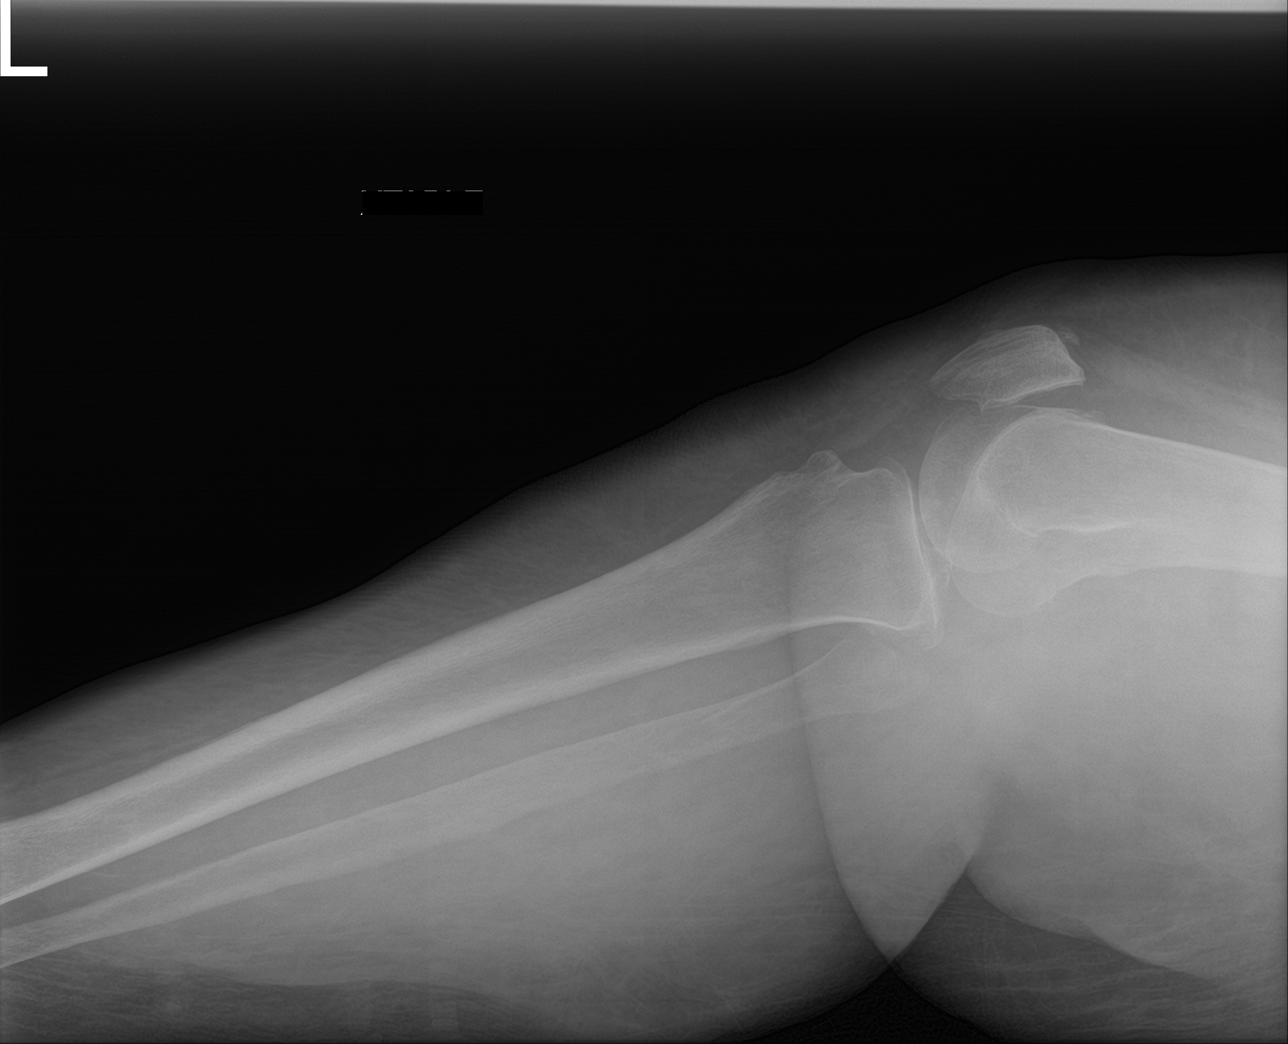

[4 of 4 positions shown; findings below may reference images not displayed]

FINDINGS: There is no evidence of fracture or other focal bone lesions.
Diffuse subcutaneous edema seen surrounding lower extremity.
IMPRESSION: No acute osseous abnormality. Diffuse subcu edema seen surrounding
the lower extremity.

## 2020-09-05 IMAGING — CR DG KNEE 1-2V*R*
2 series · 2 of 2 positions shown · non-contrast
Comparison: None.

CLINICAL DATA: Substance fell, right knee pain

EXAM:
RIGHT KNEE - 1-2 VIEW

[knee ap]
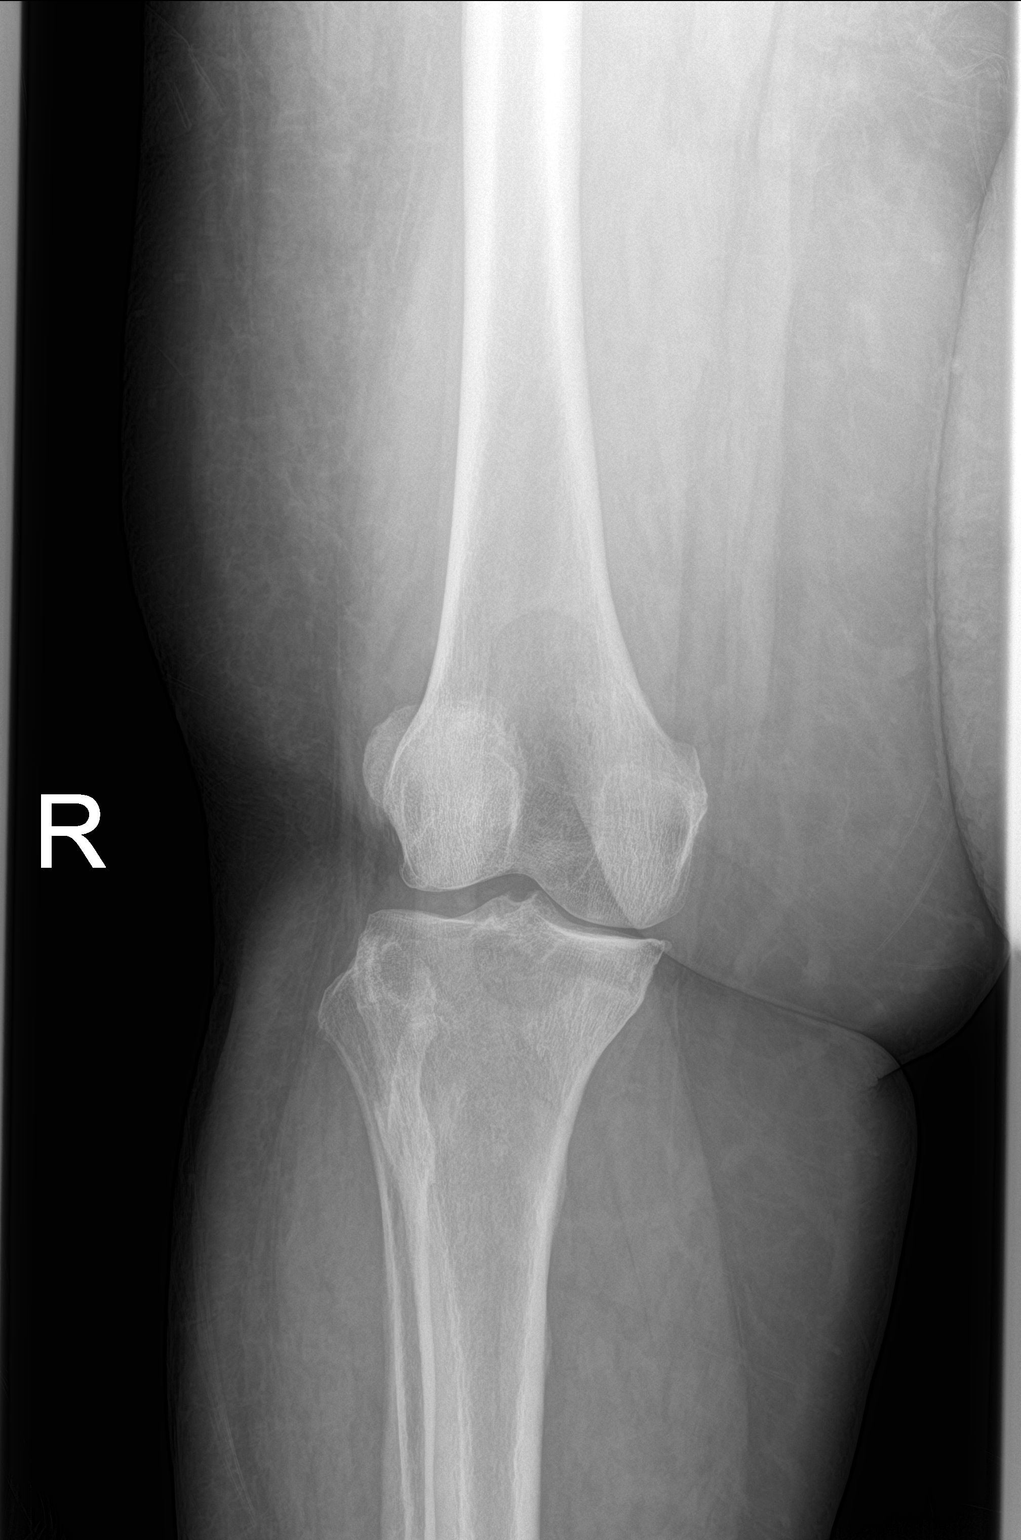

[knee lat]
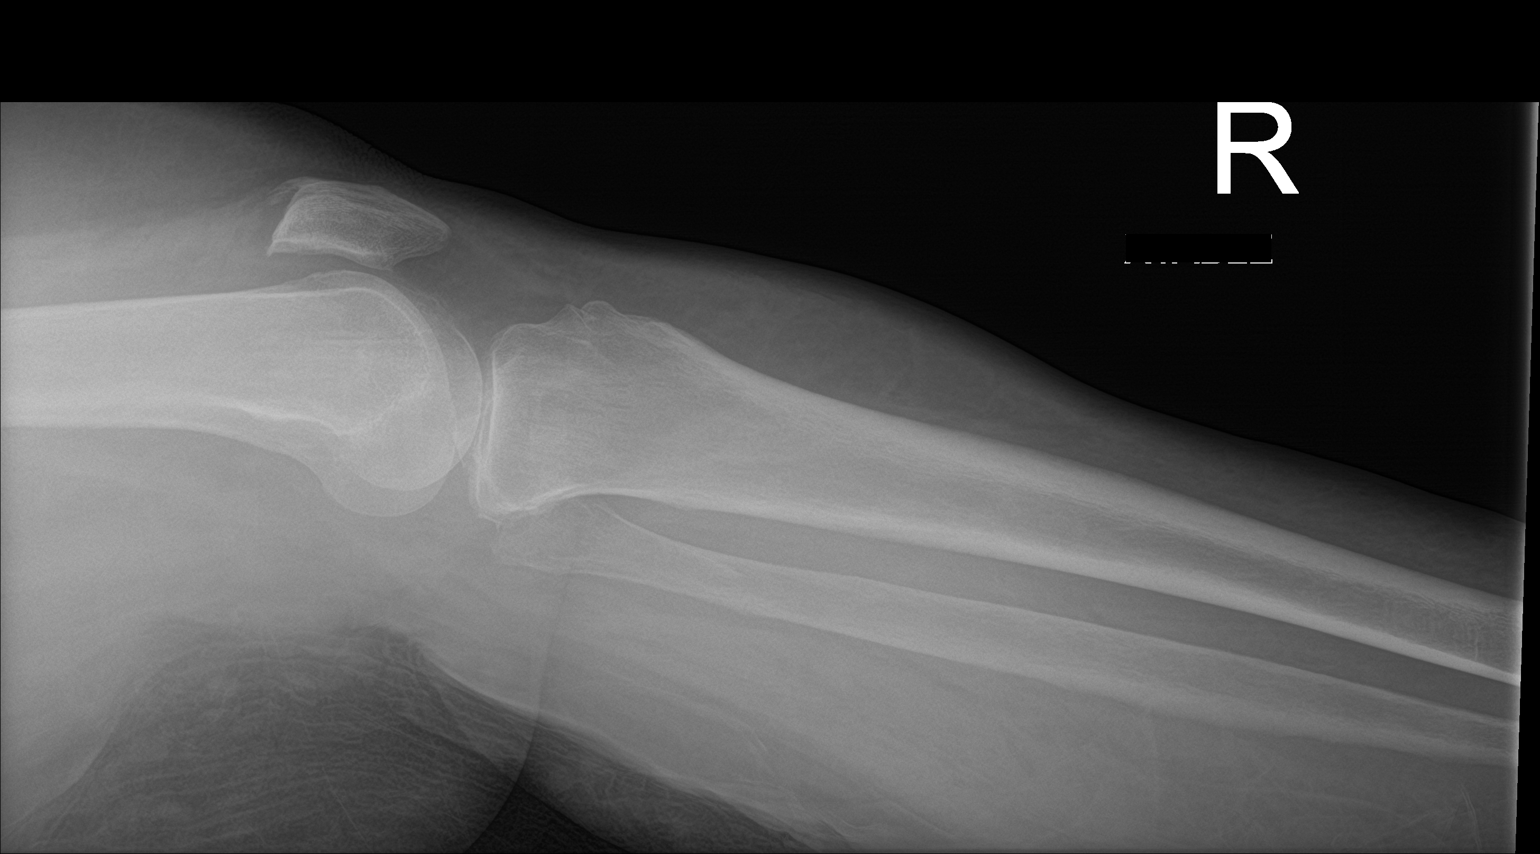

[2 of 2 positions shown; findings below may reference images not displayed]

FINDINGS: Frontal and cross-table lateral views of the right knee demonstrate
moderate medial compartmental joint space narrowing. There is
spurring of the medial and patellofemoral compartments. No fracture,
subluxation, or dislocation. No joint effusion.
IMPRESSION: 1. Medial and patellofemoral compartmental osteoarthritis. No acute
fracture.

## 2020-09-05 IMAGING — CR DG FEMUR 2+V*L*
4 series · 4 of 4 positions shown · non-contrast
Comparison: None.

CLINICAL DATA: Slipped and fell, left leg swelling

EXAM:
LEFT FEMUR 2 VIEWS

[femur ap (1 of 2)]
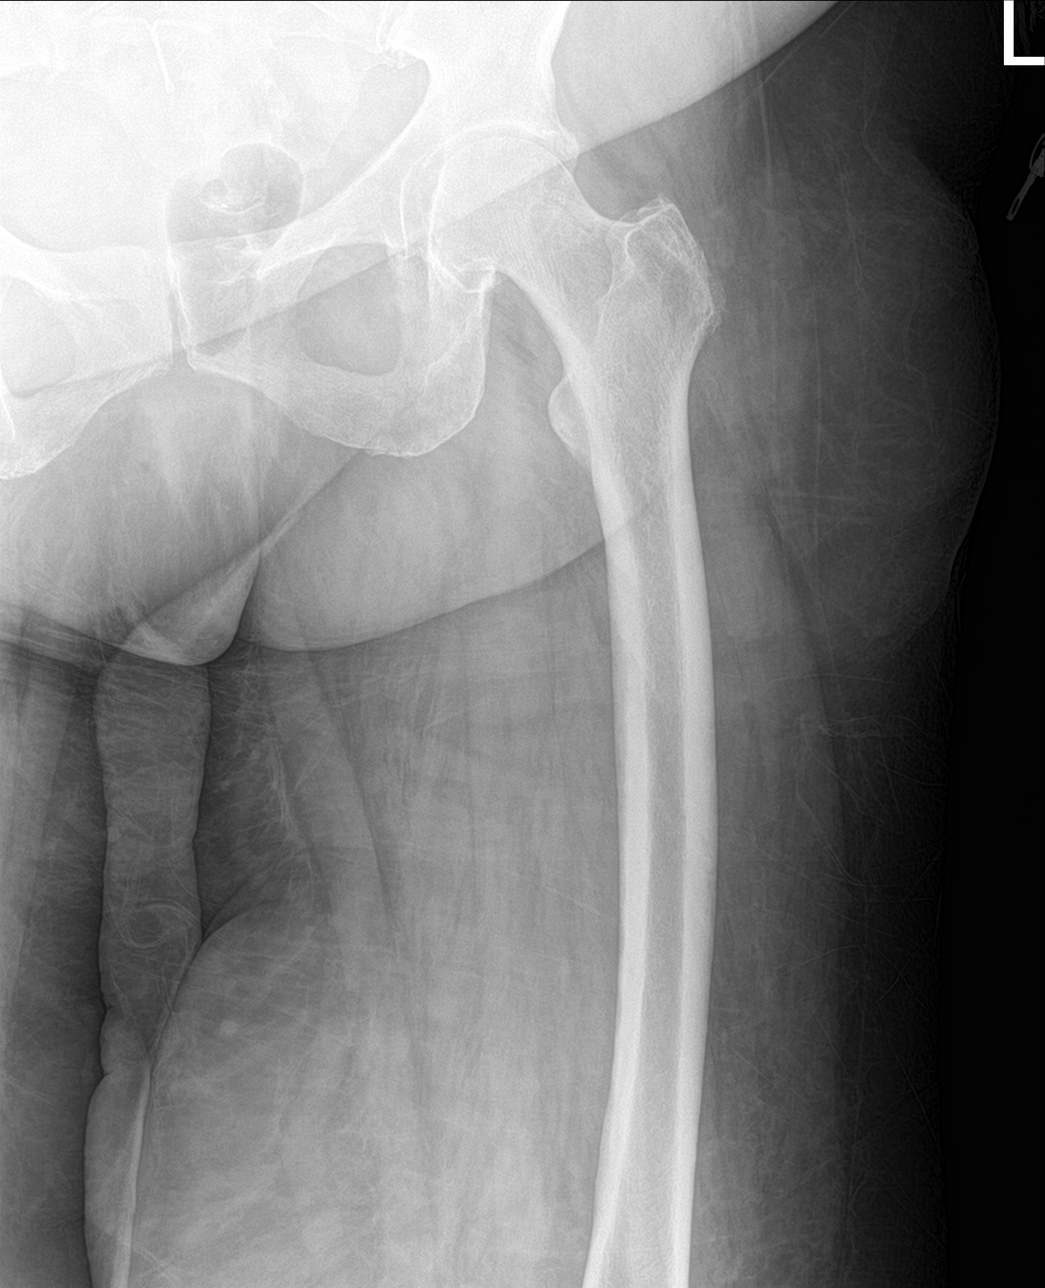

[femur ap (2 of 2)]
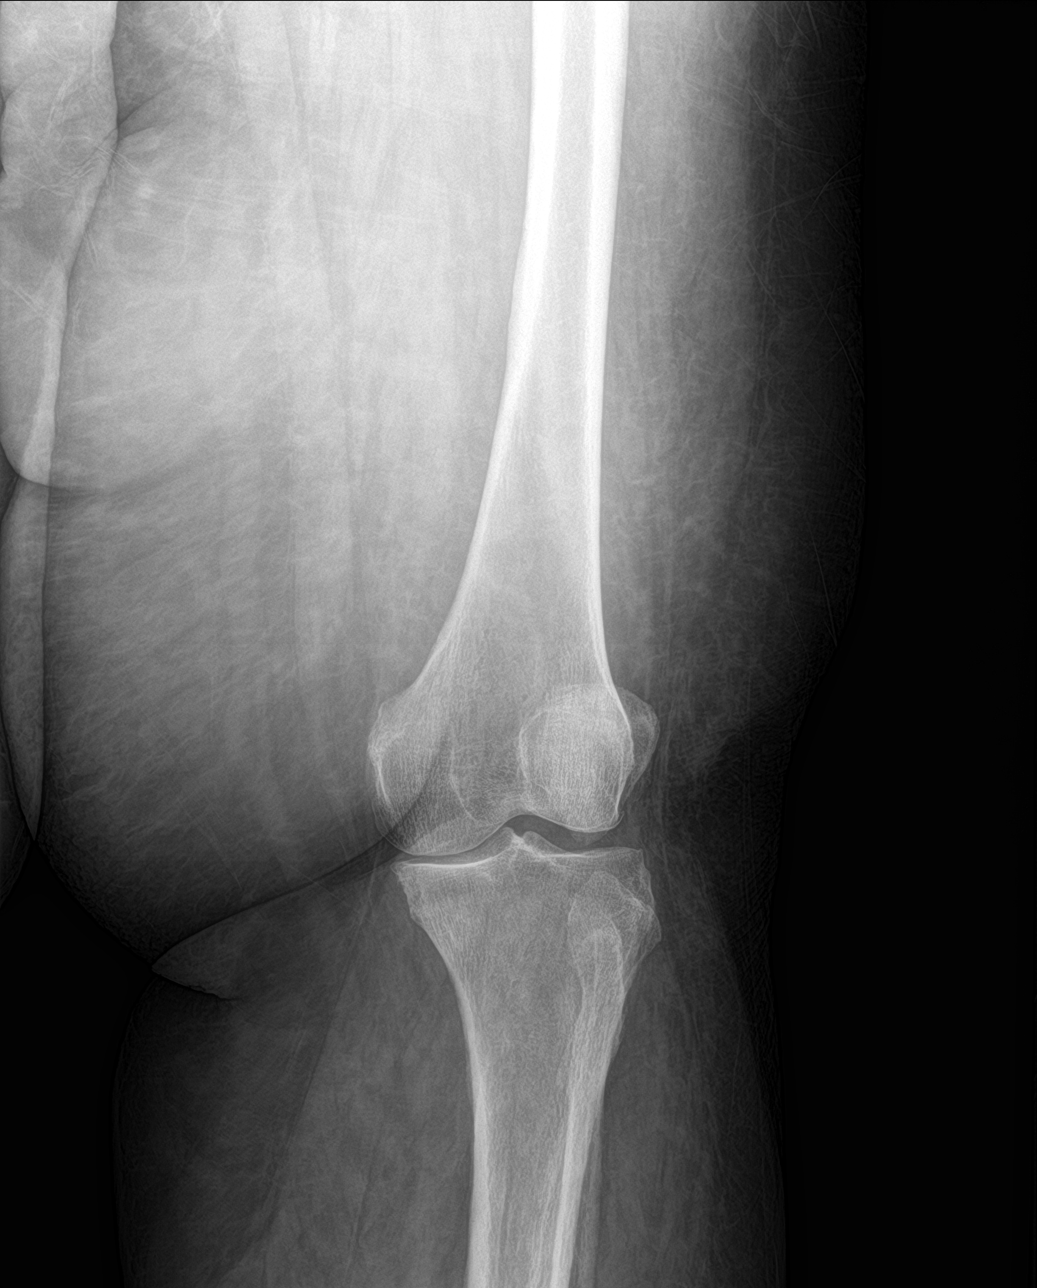

[femur lat (1 of 2)]
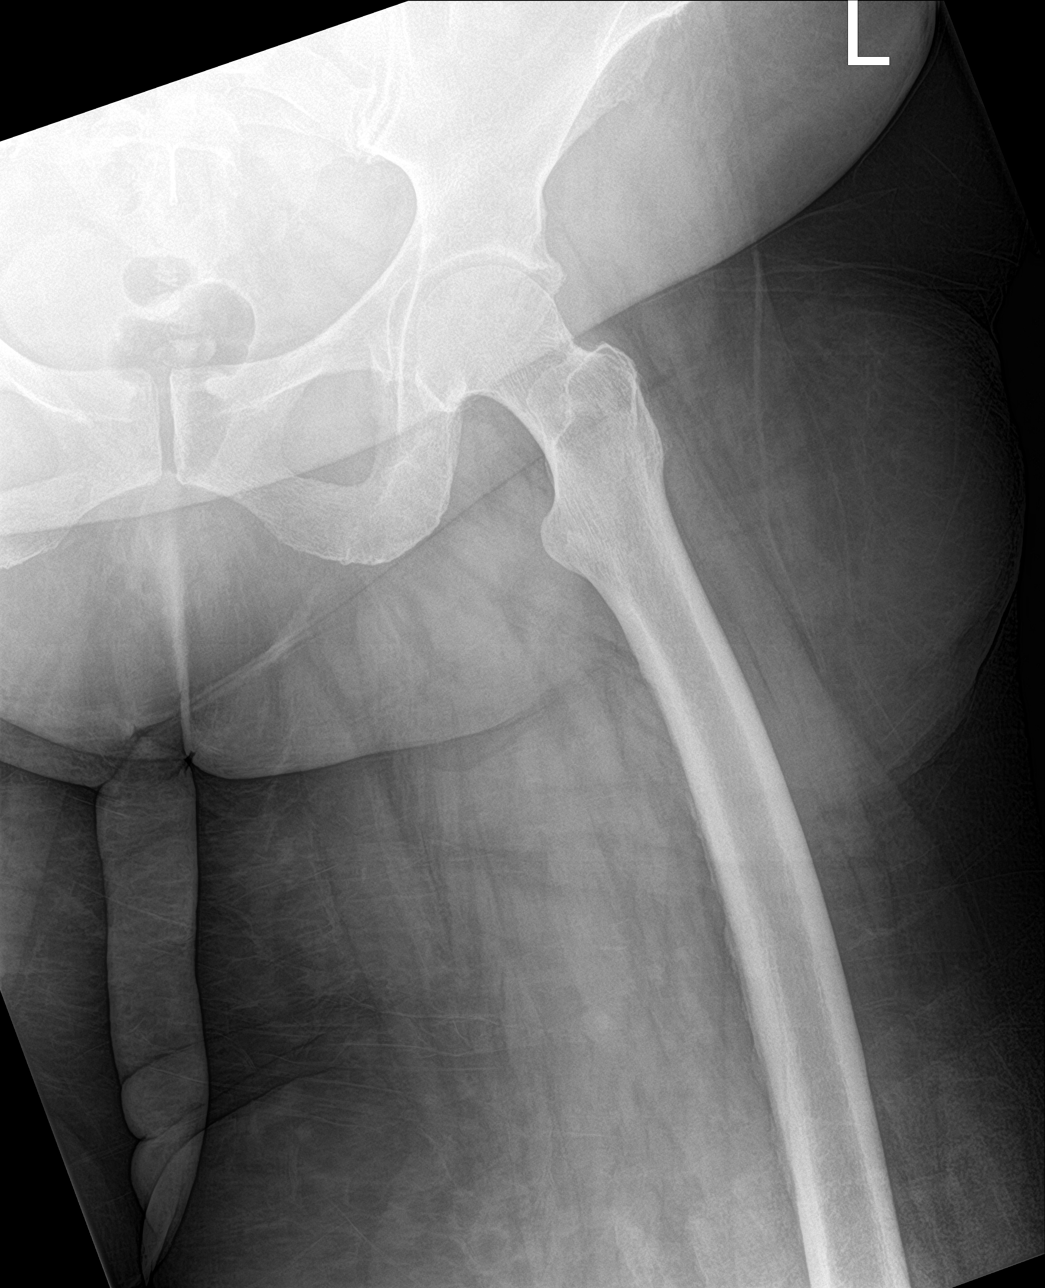

[femur lat (2 of 2)]
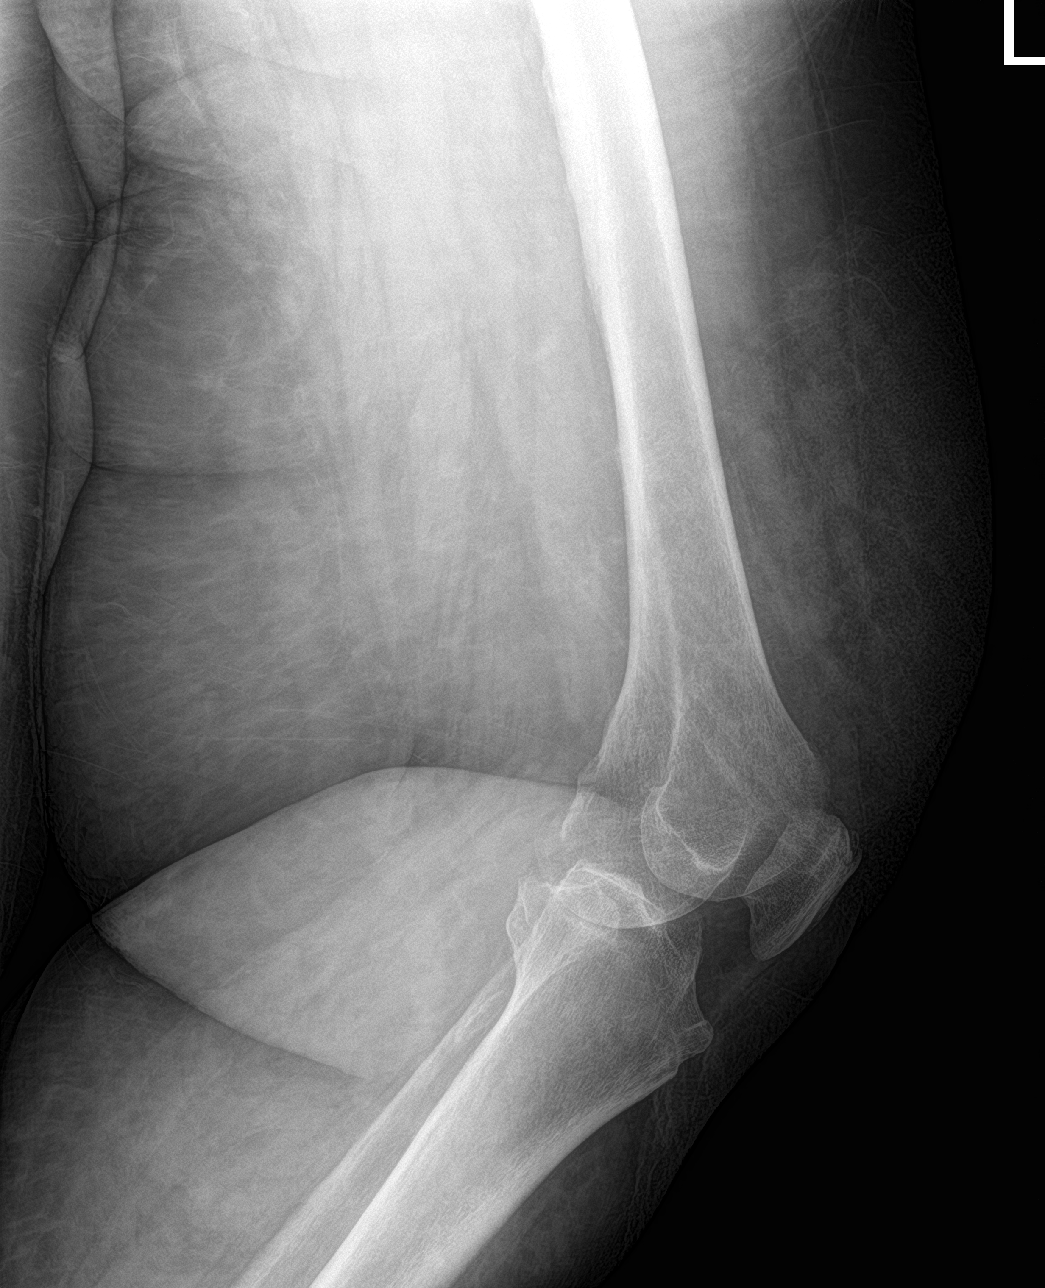

[4 of 4 positions shown; findings below may reference images not displayed]

FINDINGS: Frontal and lateral views of the left femur are obtained. No acute
fractures. Alignment of the left hip and knee is anatomic. There is
mild osteoarthritis of the left hip and medial compartment of the
left knee. Visualized portions of the left hemipelvis are
unremarkable.
IMPRESSION: 1. No acute displaced fracture.
2. Osteoarthritis of the left hip and knee.

## 2020-09-05 MED ORDER — SODIUM CHLORIDE 0.9 % IV SOLN
2.0000 g | Freq: Once | INTRAVENOUS | Status: AC
  Administered 2020-09-06: 2 g via INTRAVENOUS
  Filled 2020-09-05: qty 20

## 2020-09-05 MED ORDER — SODIUM CHLORIDE 0.9 % IV SOLN
500.0000 mg | Freq: Every day | INTRAVENOUS | Status: DC
  Administered 2020-09-06: 500 mg via INTRAVENOUS
  Filled 2020-09-05 (×2): qty 10

## 2020-09-05 NOTE — ED Provider Notes (Signed)
I assumed care of the patient at shift change from previous team, please see their note for full H&P. Briefly patient is a 64 year old woman here for evaluation of redness in her left lower leg that started about 6 days ago.  She did have a slip off of the bed prior to arrival where she was stuck on all fours between the bed and the wall for approximately 4 hours earlier today.  She did not strike her head, denies any pain in her head or neck.  Patient is laying in bed, awake and alert, no obvious distress. Significant edema on the RLE.    Plan is to follow-up on labs, CT scan. X-rays have already been obtained without evidence of subcutaneous gas.  Labs resulted as below.  Patient does have a significant leukocytosis at 23.8.  Her heart rate has been elevated above 100 which, when combined with her leukocytosis and suspected source of infection qualifies her for sepsis criteria.  Code sepsis is activated.    CMP shows mild hyponatremia at 129.  Covid test is ordered.  She did not get 31ml/kg bolus as she was not hypotensive and her lactic acid was not over 4.    CK is slightly elevated at 3952.  IV fluids ordered.   I spoke with Dr. Julian Reil who will see the patient for admission.   The patient appears reasonably stabilized for admission considering the current resources, flow, and capabilities available in the ED at this time, and I doubt any other [facility] requiring further screening and/or treatment in the ED prior to admission assuming timely admission and bed placement.  Note: Portions of this report may have been transcribed using voice recognition software. Every effort was made to ensure accuracy; however, inadvertent computerized transcription errors may be present   Labs Reviewed  CBC WITH DIFFERENTIAL/PLATELET - Abnormal; Notable for the following components:      Result Value   WBC 23.8 (*)    RDW 16.4 (*)    Neutro Abs 20.3 (*)    Monocytes Absolute 1.3 (*)    Abs Immature  Granulocytes 0.64 (*)    All other components within normal limits  COMPREHENSIVE METABOLIC PANEL - Abnormal; Notable for the following components:   Sodium 129 (*)    Chloride 95 (*)    Glucose, Bld 122 (*)    Albumin 2.2 (*)    AST 83 (*)    All other components within normal limits  CK - Abnormal; Notable for the following components:   Total CK 3,952 (*)    All other components within normal limits  CULTURE, BLOOD (ROUTINE X 2)  CULTURE, BLOOD (ROUTINE X 2)  SARS CORONAVIRUS 2 (TAT 6-24 HRS)  LACTIC ACID, PLASMA  PROTIME-INR  LACTIC ACID, PLASMA    DG Knee 2 Views Right  Result Date: 09/05/2020 CLINICAL DATA:  Substance fell, right knee pain EXAM: RIGHT KNEE - 1-2 VIEW COMPARISON:  None. FINDINGS: Frontal and cross-table lateral views of the right knee demonstrate moderate medial compartmental joint space narrowing. There is spurring of the medial and patellofemoral compartments. No fracture, subluxation, or dislocation. No joint effusion. IMPRESSION: 1. Medial and patellofemoral compartmental osteoarthritis. No acute fracture. Electronically Signed   By: Sharlet Salina M.D.   On: 09/05/2020 23:13   DG Tibia/Fibula Left  Result Date: 09/05/2020 CLINICAL DATA:  Left leg swelling EXAM: LEFT TIBIA AND FIBULA - 2 VIEW COMPARISON:  None. FINDINGS: There is no evidence of fracture or other focal bone lesions. Diffuse  subcutaneous edema seen surrounding lower extremity. IMPRESSION: No acute osseous abnormality. Diffuse subcu edema seen surrounding the lower extremity. Electronically Signed   By: Prudencio Pair M.D.   On: 09/05/2020 23:15   CT Knee Right Wo Contrast  Result Date: 09/06/2020 CLINICAL DATA:  Fall, injury EXAM: CT OF THE right KNEE WITHOUT CONTRAST TECHNIQUE: Multidetector CT imaging of the right knee was performed according to the standard protocol. Multiplanar CT image reconstructions were also generated. COMPARISON:  None. FINDINGS: Bones/Joint/Cartilage No fracture or  dislocation. No large knee joint effusion is seen. There is a well corticated ossicle seen within the region of the posterior medial meniscus which could represent a small loose body. Ligaments Suboptimally assessed by CT. Muscles and Tendons Enthesophytes seen at the quadriceps insertion site. The patellar tendon is intact. The muscles are normal appearance without focal atrophy or tear. There is edema around the medial patellar retinaculum with slight lateral subluxation of the patella. Soft tissues Prepatellar subcutaneous edema is noted. No focal soft tissue mass is seen. IMPRESSION: No acute fracture or dislocation. Edema around the medial patellar retinaculum with slight lateral subluxation of the patella. Probable small loose body in the region of the posterior medial meniscus. Electronically Signed   By: Prudencio Pair M.D.   On: 09/06/2020 00:44   DG Femur Min 2 Views Left  Result Date: 09/05/2020 CLINICAL DATA:  Slipped and fell, left leg swelling EXAM: LEFT FEMUR 2 VIEWS COMPARISON:  None. FINDINGS: Frontal and lateral views of the left femur are obtained. No acute fractures. Alignment of the left hip and knee is anatomic. There is mild osteoarthritis of the left hip and medial compartment of the left knee. Visualized portions of the left hemipelvis are unremarkable. IMPRESSION: 1. No acute displaced fracture. 2. 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 Electronically Signed   By: Randa Ngo M.D.   On: 09/05/2020 23:13          Lorin Glass, PA-C 09/06/20 0304    Tegeler, Gwenyth Allegra, MD 09/06/20 [phone]

## 2020-09-05 NOTE — ED Provider Notes (Addendum)
[facility] EMERGENCY DEPARTMENT Provider Note   CSN: [id number] Arrival date & time: 09/05/20  2204     History Chief Complaint  Patient presents with  . Fall  . Leg Pain    [name] is a 64 y.o. female.  HPI 64 year old female with extensive medical history occluding CAD, CHF, chronic lymphedema, hypertension, morbid obesity, TIA on warfarin presents to the ER with complaints of a fall and left lower extremity edema and redness.  Patient states that on December 23 she started to develop some redness and swelling to her left lower leg.  She states she has chronic lymphedema and will get wounds in her lower extremities which wax and wane, however she has never had the significant redness to her left lower extremity.  She had spoken with her PCP earlier today and was told to come to the hospital for further evaluation.  She states while she was waiting for her husband to get home to take her to the hospital, she attempted to reach for a phone charger while sitting on the bed.  She states she somehow slid off the bed between the bed and the wall.  She states she was stuck on all fours between the bed and the wall for approximately 4 hours until her husband returned from work.  She states her husband then called a friend and they were able to get her up.  They did call EMS.  She denies any dizziness or syncope at the time of the fall.  She said that the plan was to go to the ER POV, however then she realized that she had such severe right knee pain that she could not walk on it. She states she is able to ambulate normally at baseline. She is afraid she might have torn a ligament.  Denies any numbness or tingling to her lower extremities.  Denies any fevers or chills.  No chest pain or shortness of breath.  Denies any history of diabetes.  No abdominal pain, chest pain or shortness of breath.    Past Medical History:  Diagnosis Date  . Anemia 06/03/2019  . Aortic stenosis  03/23/2013   Overview:  02/18/13 TTE EF >55%. Critical AS with mean Ao valve gradient of 82 mm Hg. No AI. No MR, PR, mild TR. Estimated RVSP 30 mm Hg.  . Bicuspid aortic valve   . CAD (coronary artery disease) 06/03/2019  . Chronic diastolic CHF (congestive heart failure) ([place]) 06/03/2019  . Dysfunctional uterine bleeding 04/22/2018  . Edema of both legs 02/20/2017  . Encounter for insertion of mirena IUD 08/17/2019   Mirena IUD / 52mg  Levonorgestrel Insertion Date: 08/17/19 S/N: [id number] Exp: 1/23 Lot: [id number]  . Essential hypertension 06/03/2019  . 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 Heart failure ([place])   . HTN (hypertension) 03/23/2013  . 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 Hypertensive heart disease with heart failure ([place]) 03/23/2013  . Iron deficiency anemia due to chronic blood loss 10/19/2019  . Long term (current) use of anticoagulants 06/11/2019  . Morbid obesity ([place]) 02/20/2017  . Obstructive hypertrophic cardiomyopathy ([place]) 07/10/2018  . Postmenopausal bleeding 04/16/2018   Added automatically from request for surgery [id number]  Last Assessment & Plan:  1. Postmenopausal bleeding since for the past 2 years.  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 Last biopsy was in May  of 2019 which showed weakly proliferative endometrium possibly a polyp.    . Sleep apnea   . TIA (transient ischemic attack) 06/04/2019  . Transient neurologic deficit 06/03/2019    Patient Active Problem List   Diagnosis Date Noted  . Heart failure (HCC)   . Bicuspid aortic valve   . Iron deficiency anemia due to chronic blood loss 10/19/2019  . Insomnia 10/07/2019  . Encounter for insertion of mirena IUD 08/17/2019  . Long term (current) use of anticoagulants 06/11/2019  . TIA (transient ischemic attack) 06/04/2019  . Transient neurologic deficit 06/03/2019  . Anemia  06/03/2019  . Chronic diastolic CHF (congestive heart failure) (HCC) 06/03/2019  . CAD (coronary artery disease) 06/03/2019  . Essential hypertension 06/03/2019  . Avulsed toenail, initial encounter 05/27/2019  . Infection of toenail 05/27/2019  . Sleep apnea 09/25/2018  . Obstructive hypertrophic cardiomyopathy (HCC) 07/10/2018  . Dysfunctional uterine bleeding 04/22/2018  . Postmenopausal bleeding 04/16/2018  . Edema of both legs 02/20/2017  . 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 Morbid obesity (HCC) 02/20/2017  . Aortic stenosis 03/23/2013  . Hypertensive heart disease with heart failure (HCC) 03/23/2013  . HTN (hypertension) 03/23/2013    Past Surgical History:  Procedure Laterality Date  . BUBBLE STUDY  06/07/2019   Procedure: BUBBLE STUDY;  Surgeon: Jodelle Red, MD;  Location: [facility] ENDOSCOPY;  Service: Cardiovascular;;  . CHOLECYSTECTOMY  1983  . RIGHT/LEFT HEART CATH AND CORONARY ANGIOGRAPHY N/A 04/01/2019   Procedure: RIGHT/LEFT HEART CATH AND CORONARY ANGIOGRAPHY;  Surgeon: Runell Gess, MD;  Location: [facility] INVASIVE CV LAB;  Service: Cardiovascular;  Laterality: N/A;  . TEE WITHOUT CARDIOVERSION N/A 07/01/2018   Procedure: TRANSESOPHAGEAL ECHOCARDIOGRAM (TEE);  Surgeon: Jodelle Red, MD;  Location: [facility] ENDOSCOPY;  Service: Cardiovascular;  Laterality: N/A;  . TEE WITHOUT CARDIOVERSION N/A 06/07/2019   Procedure: TRANSESOPHAGEAL ECHOCARDIOGRAM (TEE);  Surgeon: Jodelle Red, MD;  Location: [facility] ENDOSCOPY;  Service: Cardiovascular;  Laterality: N/A;  . TISSUE AORTIC VALVE REPLACEMENT       OB History   No obstetric history on file.     Family History  Problem Relation Age of Onset  . Parkinson's disease Father   . Heart attack Mother     Social History   Tobacco Use  . Smoking status: Never Smoker  . Smokeless tobacco: Never Used  Vaping Use  . Vaping Use: Never used  Substance Use  Topics  . Alcohol use: No  . Drug use: No    Home Medications Prior to Admission medications   Medication Sig Start Date End Date Taking? Authorizing Provider  camphor-menthol Wynelle Fanny) lotion Apply 1 application topically as needed for itching. 06/09/19   Darlin Drop, DO  levonorgestrel (MIRENA) 20 MCG/24HR IUD 1 each by Intrauterine route once.    [provider]  metoprolol succinate (TOPROL XL) 25 MG 24 hr tablet Take 2 tablets (50 mg total) by mouth in the morning AND 1 tablet (25 mg total) every evening. 03/28/20   Laurey Morale, MD  MYRBETRIQ 25 MG TB24 tablet Take 25 mg by mouth daily. 02/24/20   [provider]  potassium chloride (KLOR-CON) 10 MEQ tablet Take 1 tablet (10 mEq total) by mouth 2 (two) times daily. 09/16/19 06/06/20  Baldo Daub, MD  pravastatin (PRAVACHOL) 40 MG tablet Take 1 tablet (40 mg total) by mouth daily at 6 PM. 06/09/19   Darlin Drop, DO  ramelteon (ROZEREM) 8 MG tablet Take  1 tablet (8 mg total) by mouth at bedtime. Patient not taking: Reported on 03/28/2020 06/21/19   Laurin Coder, MD  torsemide (DEMADEX) 20 MG tablet Take 3 tablets (60 mg total) by mouth daily. 03/28/20   Larey Dresser, MD  triamcinolone cream (KENALOG) 0.1 % APPLY THIN FILM OF CREAM OVER THE LEG WITH EACH WRAP CHANGE. NOT TO BE PLACED IN THE WOUNDS 10/21/19   [provider]  warfarin (COUMADIN) 2.5 MG tablet Take 1 tablet by mouth once daily 04/28/20   Richardo Priest, MD    Allergies    Vancomycin  Review of Systems   Review of Systems  Constitutional: Negative for chills and fever.  HENT: Negative for ear pain and sore throat.   Eyes: Negative for pain and visual disturbance.  Respiratory: Negative for cough and shortness of breath.   Cardiovascular: Positive for leg swelling. Negative for chest pain and palpitations.  Gastrointestinal: Negative for abdominal pain and vomiting.  Genitourinary: Negative for dysuria and hematuria.   Musculoskeletal: Positive for arthralgias and gait problem. Negative for back pain and joint swelling.  Skin: Negative for color change and rash.  Neurological: Negative for seizures and syncope.  All other systems reviewed and are negative.   Physical Exam Updated Vital Signs BP (!) 144/62   Pulse (!) 106   Temp 98.6 F (37 C) (Oral)   Resp 20   SpO2 95%   Physical Exam Vitals and nursing note reviewed.  Constitutional:      General: She is not in acute distress.    Appearance: She is well-developed and well-nourished. She is obese. She is not diaphoretic.  HENT:     Head: Normocephalic and atraumatic.  Eyes:     Conjunctiva/sclera: Conjunctivae normal.  Cardiovascular:     Rate and Rhythm: Normal rate and regular rhythm.     Pulses: Normal pulses.     Heart sounds: No murmur heard.   Pulmonary:     Effort: Pulmonary effort is normal. No respiratory distress.     Breath sounds: Normal breath sounds.  Abdominal:     General: Abdomen is flat.     Palpations: Abdomen is soft.     Tenderness: There is no abdominal tenderness.  Musculoskeletal:        General: Swelling and tenderness present.     Cervical back: Neck supple.     Left lower leg: Edema present.     Comments: DP pulses located with Doppler. Full passive ROM of right and left knee.  Mild erythema and warmth the the medial aspect of the right knee with mild tibial plateau tenderness.  Full passive ROM of left knee, left leg with significant edema, erythema, moderate purple discoloration to the lower shin and warmth with multiple wounds, some weeping clear fluid to the lower extremities.  Sensations intact.  Skin:    General: Skin is warm and dry.     Capillary Refill: Capillary refill takes 2 to 3 seconds.     Findings: Erythema present.  Neurological:     General: No focal deficit present.     Mental Status: She is alert and oriented to person, place, and time.     Sensory: No sensory deficit.     Motor: No  weakness.     Comments: Mental Status:  Alert, thought content appropriate, able to give a coherent history. Speech fluent without evidence of aphasia. Able to follow 2 step commands without difficulty.  Cranial Nerves:  II: Peripheral visual  fields grossly normal, pupils equal, round, reactive to light III,IV, VI: ptosis not present, extra-ocular motions intact bilaterally  V,VII: smile symmetric, facial light touch sensation equal VIII: hearing grossly normal to voice  X: uvula elevates symmetrically  XI: bilateral shoulder shrug symmetric and strong XII: midline tongue extension without fassiculations Motor:  Normal tone. 5/5 strength of BUE and BLE major muscle groups including strong and equal grip strength and dorsiflexion/plantar flexion Sensory: light touch normal in all extremities. Cerebellar: normal finger-to-nose with bilateral upper extremities, Romberg sign absent Gait: not accessed   Psychiatric:        Mood and Affect: Mood and affect and mood normal.        Behavior: Behavior normal.         ED Results / Procedures / Treatments   Labs (all labs ordered are listed, but only abnormal results are displayed) Labs Reviewed  CULTURE, BLOOD (ROUTINE X 2)  CULTURE, BLOOD (ROUTINE X 2)  SARS CORONAVIRUS 2 (TAT 6-24 HRS)  CBC WITH DIFFERENTIAL/PLATELET  COMPREHENSIVE METABOLIC PANEL  LACTIC ACID, PLASMA  LACTIC ACID, PLASMA  PROTIME-INR  CK    EKG None  Radiology DG Knee 2 Views Right  Result Date: 09/05/2020 CLINICAL DATA:  Substance fell, right knee pain EXAM: RIGHT KNEE - 1-2 VIEW COMPARISON:  None. FINDINGS: Frontal and cross-table lateral views of the right knee demonstrate moderate medial compartmental joint space narrowing. There is spurring of the medial and patellofemoral compartments. No fracture, subluxation, or dislocation. No joint effusion. IMPRESSION: 1. Medial and patellofemoral compartmental osteoarthritis. No acute fracture. Electronically  Signed   By: Randa Ngo M.D.   On: 09/05/2020 23:13   DG Tibia/Fibula Left  Result Date: 09/05/2020 CLINICAL DATA:  Left leg swelling EXAM: LEFT TIBIA AND FIBULA - 2 VIEW COMPARISON:  None. FINDINGS: There is no evidence of fracture or other focal bone lesions. Diffuse subcutaneous edema seen surrounding lower extremity. IMPRESSION: No acute osseous abnormality. Diffuse subcu edema seen surrounding the lower extremity. Electronically Signed   By: Prudencio Pair M.D.   On: 09/05/2020 23:15   DG Femur Min 2 Views Left  Result Date: 09/05/2020 CLINICAL DATA:  Slipped and fell, left leg swelling EXAM: LEFT FEMUR 2 VIEWS COMPARISON:  None. FINDINGS: Frontal and lateral views of the left femur are obtained. No acute fractures. Alignment of the left hip and knee is anatomic. There is mild osteoarthritis of the left hip and medial compartment of the left knee. Visualized portions of the left hemipelvis are unremarkable. IMPRESSION: 1. No acute displaced fracture. 2. 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 Electronically Signed   By: Randa Ngo M.D.   On: 09/05/2020 23:13    Procedures Procedures (including critical care time)  Medications Ordered in ED Medications  cefTRIAXone (ROCEPHIN) 2 g in sodium chloride 0.9 % 100 mL IVPB (has no administration in time range)    ED Course  I have reviewed the triage vital signs and the nursing notes.  Pertinent labs & imaging results that were available during my care of the patient were reviewed by me and considered in my medical decision making (see chart for details).    MDM Rules/Calculators/A&P                         64 year old female presents to the ER with left leg swelling and a fall earlier today On presentation, she is alert, oriented, nontoxic-appearing, ino acute distress, resting comfortably in the ER bed.  Vitals with mild tachycardia and hypertension, however afebrile. Not hypoxic. Physical exam with no gross neurologic  abnormalities, and all 4 extremities, however left leg is extremely erythematous, edematous, warm, tracking all the way up to her mid to upper thigh. The pulses were located with Dopplers. She also has some weeping wounds to her left lower extremity. Right knee with some mild erythema and warmth, however full range of motion of knee. Tibial plateau tenderness.  Given concerning presentation of left leg however quickly the infection has progressed, there is concern for significant cellulitis/questionable necrotizing fasciitis. Plain films ordered, left leg with subcu edema but no evidence of gas. Right knee without evidence of fracture. Left femur without evidence of septic cutaneous gas. However, given tibial plateau tenderness, will order CT of right knee to rule out fracture. Low suspicion for septic knee at this time.   Low concern for any other injuries from the fall as she states she "slid" and did not actually fall. She does not complain of any other pain outside of her right knee. Do not think she needs any stroke work-up at this time as she has no neurologic deficits on exam. Afebrile here in the ED, low suspicion for sepsis at this time, however I initiated septic work-up: basic labs, lactic, blood culture, CK, Covid swab. Suspect patient will need admission for rapidly progressing cellulitis. Patient has a listed allergy to vancomycin which included itching, consulted with pharmacy, will initiate antibiotic therapy with daptomycin and Rocephin.  Signed out care to Delane Ginger who will oversee the patient's lab work and CT scan and admit the patient.   This was a shared visit with my supervising physician Dr. Sherry Ruffing who independently saw and evaluated the patient & provided guidance in evaluation/management/disposition ,in agreement with care  Final Clinical Impression(s) / ED Diagnoses Final diagnoses:  None    Rx / DC Orders ED Discharge Orders    None        Lyndel Safe 09/05/20 2348    Tegeler, Gwenyth Allegra, MD 09/07/20 0002

## 2020-09-05 NOTE — ED Triage Notes (Signed)
Pt BIB [facility] for a fall.  Pt slipped off bed 4 hrs ago and became stuck between the bed and the wall for a couple of hours. Husband and a neighbor got her up to bed but she could not bear weight well enough to come to hospital POV.  She complains of right leg and hip pain.    Pt also has severe redness with wounds to left leg that presents like cellulitis.   She was planning to come to hospital for tx of that prior to the fall at the advise of  Her doctor,.

## 2020-09-06 ENCOUNTER — Emergency Department (HOSPITAL_COMMUNITY): Payer: Medicare Other

## 2020-09-06 ENCOUNTER — Inpatient Hospital Stay (HOSPITAL_COMMUNITY): Payer: Medicare Other

## 2020-09-06 DIAGNOSIS — A419 Sepsis, unspecified organism: Principal | ICD-10-CM

## 2020-09-06 DIAGNOSIS — Z954 Presence of other heart-valve replacement: Secondary | ICD-10-CM

## 2020-09-06 DIAGNOSIS — M6282 Rhabdomyolysis: Secondary | ICD-10-CM | POA: Diagnosis present

## 2020-09-06 DIAGNOSIS — I421 Obstructive hypertrophic cardiomyopathy: Secondary | ICD-10-CM | POA: Diagnosis present

## 2020-09-06 DIAGNOSIS — Z820 Family history of epilepsy and other diseases of the nervous system: Secondary | ICD-10-CM | POA: Diagnosis not present

## 2020-09-06 DIAGNOSIS — I2510 Atherosclerotic heart disease of native coronary artery without angina pectoris: Secondary | ICD-10-CM | POA: Diagnosis present

## 2020-09-06 DIAGNOSIS — I5032 Chronic diastolic (congestive) heart failure: Secondary | ICD-10-CM

## 2020-09-06 DIAGNOSIS — L03116 Cellulitis of left lower limb: Secondary | ICD-10-CM

## 2020-09-06 DIAGNOSIS — L0390 Cellulitis, unspecified: Secondary | ICD-10-CM | POA: Diagnosis not present

## 2020-09-06 DIAGNOSIS — Z7901 Long term (current) use of anticoagulants: Secondary | ICD-10-CM | POA: Diagnosis not present

## 2020-09-06 DIAGNOSIS — Z953 Presence of xenogenic heart valve: Secondary | ICD-10-CM | POA: Diagnosis not present

## 2020-09-06 DIAGNOSIS — M25551 Pain in right hip: Secondary | ICD-10-CM | POA: Diagnosis present

## 2020-09-06 DIAGNOSIS — E1165 Type 2 diabetes mellitus with hyperglycemia: Secondary | ICD-10-CM | POA: Diagnosis not present

## 2020-09-06 DIAGNOSIS — M25561 Pain in right knee: Secondary | ICD-10-CM | POA: Diagnosis present

## 2020-09-06 DIAGNOSIS — D649 Anemia, unspecified: Secondary | ICD-10-CM | POA: Diagnosis present

## 2020-09-06 DIAGNOSIS — Z20822 Contact with and (suspected) exposure to covid-19: Secondary | ICD-10-CM | POA: Diagnosis present

## 2020-09-06 DIAGNOSIS — R600 Localized edema: Secondary | ICD-10-CM

## 2020-09-06 DIAGNOSIS — Z79899 Other long term (current) drug therapy: Secondary | ICD-10-CM | POA: Diagnosis not present

## 2020-09-06 DIAGNOSIS — W06XXXA Fall from bed, initial encounter: Secondary | ICD-10-CM | POA: Diagnosis present

## 2020-09-06 DIAGNOSIS — Z881 Allergy status to other antibiotic agents status: Secondary | ICD-10-CM | POA: Diagnosis not present

## 2020-09-06 DIAGNOSIS — I890 Lymphedema, not elsewhere classified: Secondary | ICD-10-CM | POA: Diagnosis present

## 2020-09-06 DIAGNOSIS — E871 Hypo-osmolality and hyponatremia: Secondary | ICD-10-CM | POA: Diagnosis present

## 2020-09-06 DIAGNOSIS — I110 Hypertensive heart disease with heart failure: Secondary | ICD-10-CM | POA: Diagnosis present

## 2020-09-06 DIAGNOSIS — Z6841 Body Mass Index (BMI) 40.0 and over, adult: Secondary | ICD-10-CM | POA: Diagnosis not present

## 2020-09-06 DIAGNOSIS — T796XXA Traumatic ischemia of muscle, initial encounter: Secondary | ICD-10-CM

## 2020-09-06 DIAGNOSIS — Z8249 Family history of ischemic heart disease and other diseases of the circulatory system: Secondary | ICD-10-CM | POA: Diagnosis not present

## 2020-09-06 DIAGNOSIS — Z8673 Personal history of transient ischemic attack (TIA), and cerebral infarction without residual deficits: Secondary | ICD-10-CM | POA: Diagnosis not present

## 2020-09-06 LAB — CBC WITH DIFFERENTIAL/PLATELET
Abs Immature Granulocytes: 0.64 10*3/uL — ABNORMAL HIGH (ref 0.00–0.07)
Basophils Absolute: 0.1 10*3/uL (ref 0.0–0.1)
Basophils Relative: 0 %
Eosinophils Absolute: 0.1 10*3/uL (ref 0.0–0.5)
Eosinophils Relative: 0 %
HCT: 41.7 % (ref 36.0–46.0)
Hemoglobin: 13.3 g/dL (ref 12.0–15.0)
Immature Granulocytes: 3 %
Lymphocytes Relative: 6 %
Lymphs Abs: 1.4 10*3/uL (ref 0.7–4.0)
MCH: 27.1 pg (ref 26.0–34.0)
MCHC: 31.9 g/dL (ref 30.0–36.0)
MCV: 85.1 fL (ref 80.0–100.0)
Monocytes Absolute: 1.3 10*3/uL — ABNORMAL HIGH (ref 0.1–1.0)
Monocytes Relative: 5 %
Neutro Abs: 20.3 10*3/uL — ABNORMAL HIGH (ref 1.7–7.7)
Neutrophils Relative %: 86 %
Platelets: 254 10*3/uL (ref 150–400)
RBC: 4.9 MIL/uL (ref 3.87–5.11)
RDW: 16.4 % — ABNORMAL HIGH (ref 11.5–15.5)
WBC: 23.8 10*3/uL — ABNORMAL HIGH (ref 4.0–10.5)
nRBC: 0 % (ref 0.0–0.2)

## 2020-09-06 LAB — CBC
HCT: 51.9 % — ABNORMAL HIGH (ref 36.0–46.0)
Hemoglobin: 15.9 g/dL — ABNORMAL HIGH (ref 12.0–15.0)
MCH: 26.2 pg (ref 26.0–34.0)
MCHC: 30.6 g/dL (ref 30.0–36.0)
MCV: 85.6 fL (ref 80.0–100.0)
Platelets: 215 10*3/uL (ref 150–400)
RBC: 6.06 MIL/uL — ABNORMAL HIGH (ref 3.87–5.11)
RDW: 16.9 % — ABNORMAL HIGH (ref 11.5–15.5)
WBC: 16.7 10*3/uL — ABNORMAL HIGH (ref 4.0–10.5)
nRBC: 0 % (ref 0.0–0.2)

## 2020-09-06 LAB — COMPREHENSIVE METABOLIC PANEL
ALT: 34 U/L (ref 0–44)
AST: 83 U/L — ABNORMAL HIGH (ref 15–41)
Albumin: 2.2 g/dL — ABNORMAL LOW (ref 3.5–5.0)
Alkaline Phosphatase: 59 U/L (ref 38–126)
Anion gap: 9 (ref 5–15)
BUN: 18 mg/dL (ref 8–23)
CO2: 25 mmol/L (ref 22–32)
Calcium: 9.5 mg/dL (ref 8.9–10.3)
Chloride: 95 mmol/L — ABNORMAL LOW (ref 98–111)
Creatinine, Ser: 0.93 mg/dL (ref 0.44–1.00)
GFR, Estimated: >60 mL/min (ref >60)
Glucose, Bld: 122 mg/dL — ABNORMAL HIGH (ref 70–99)
Potassium: 3.9 mmol/L (ref 3.5–5.1)
Sodium: 129 mmol/L — ABNORMAL LOW (ref 135–145)
Total Bilirubin: 0.9 mg/dL (ref 0.3–1.2)
Total Protein: 7.3 g/dL (ref 6.5–8.1)

## 2020-09-06 LAB — URINALYSIS, ROUTINE W REFLEX MICROSCOPIC
Bilirubin Urine: NEGATIVE
Glucose, UA: NEGATIVE mg/dL
Ketones, ur: NEGATIVE mg/dL
Leukocytes,Ua: NEGATIVE
Nitrite: NEGATIVE
Protein, ur: 100 mg/dL — AB
Specific Gravity, Urine: 1.025 (ref 1.005–1.030)
pH: 6 (ref 5.0–8.0)

## 2020-09-06 LAB — BASIC METABOLIC PANEL
Anion gap: 11 (ref 5–15)
BUN: 15 mg/dL (ref 8–23)
CO2: 22 mmol/L (ref 22–32)
Calcium: 9.2 mg/dL (ref 8.9–10.3)
Chloride: 98 mmol/L (ref 98–111)
Creatinine, Ser: 0.79 mg/dL (ref 0.44–1.00)
GFR, Estimated: >60 mL/min (ref >60)
Glucose, Bld: 108 mg/dL — ABNORMAL HIGH (ref 70–99)
Potassium: 3.8 mmol/L (ref 3.5–5.1)
Sodium: 131 mmol/L — ABNORMAL LOW (ref 135–145)

## 2020-09-06 LAB — PROTIME-INR
INR: 1.2 (ref 0.8–1.2)
Prothrombin Time: 15 seconds (ref 11.4–15.2)

## 2020-09-06 LAB — CK
Total CK: 3952 U/L — ABNORMAL HIGH (ref 38–234)
Total CK: 4082 U/L — ABNORMAL HIGH (ref 38–234)

## 2020-09-06 LAB — HIV ANTIBODY (ROUTINE TESTING W REFLEX): HIV Screen 4th Generation wRfx: NONREACTIVE

## 2020-09-06 LAB — PROCALCITONIN: Procalcitonin: 2.1 ng/mL

## 2020-09-06 LAB — SARS CORONAVIRUS 2 (TAT 6-24 HRS): SARS Coronavirus 2: NEGATIVE

## 2020-09-06 LAB — CORTISOL-AM, BLOOD: Cortisol - AM: 17.7 ug/dL (ref 6.7–22.6)

## 2020-09-06 LAB — LACTIC ACID, PLASMA
Lactic Acid, Venous: 1.7 mmol/L (ref 0.5–1.9)
Lactic Acid, Venous: 1.6 mmol/L (ref 0.5–1.9)

## 2020-09-06 LAB — HEPARIN LEVEL (UNFRACTIONATED): Heparin Unfractionated: <0.1 IU/mL — ABNORMAL LOW (ref 0.30–0.70)

## 2020-09-06 IMAGING — CT CT KNEE*R* W/O CM
3 of 5 series · 16 of 33 positions shown, 19 images · non-contrast
Comparison: None.

CLINICAL DATA: Fall, injury

EXAM:
CT OF THE right KNEE WITHOUT CONTRAST
TECHNIQUE: Multidetector CT imaging of the right knee was performed according
to the standard protocol. Multiplanar CT image reconstructions were
also generated.

[Series 4: extremity soft tissue · axial · 0.45mm/px · z∈[-1123,-931]mm · 10 of 114 slices shown, 13 images]
[im 9/114  soft-tissue]
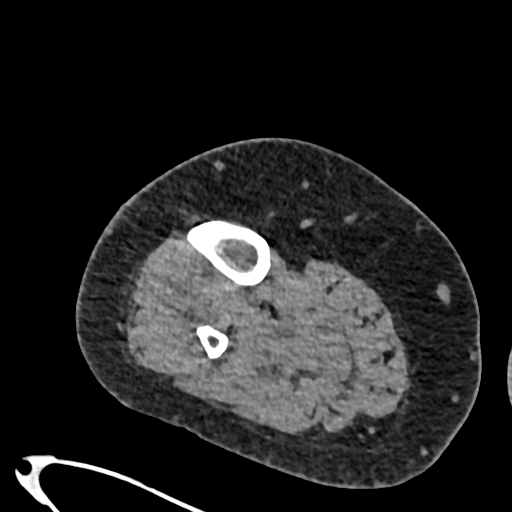
[im 9/114  bone]
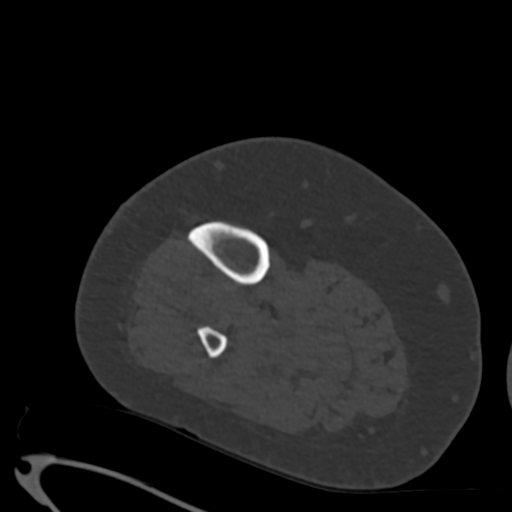
[im 18/114  bone]
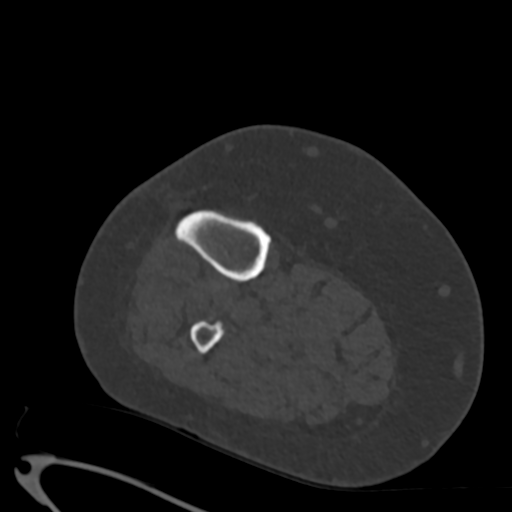
[im 35/114  bone]
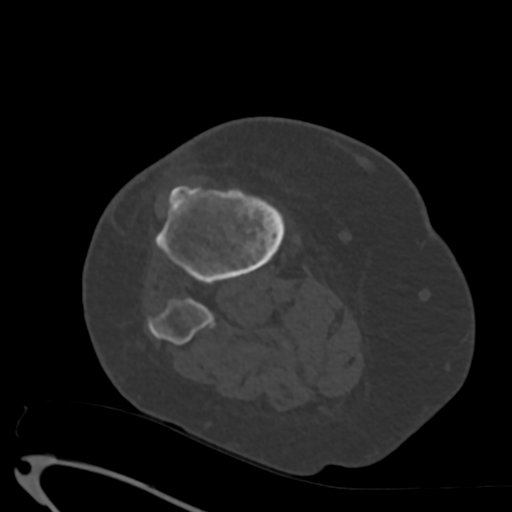
[im 44/114  bone]
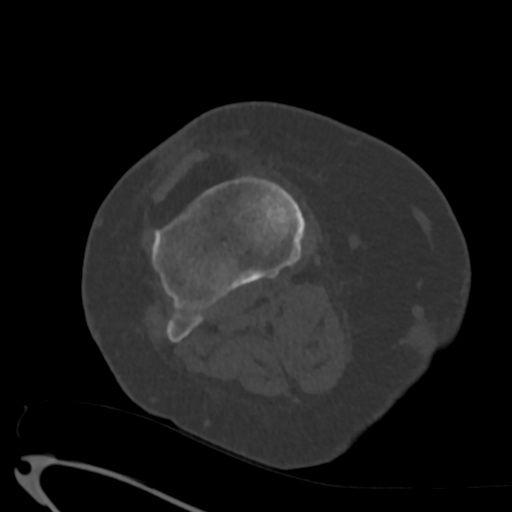
[im 53/114  soft-tissue]
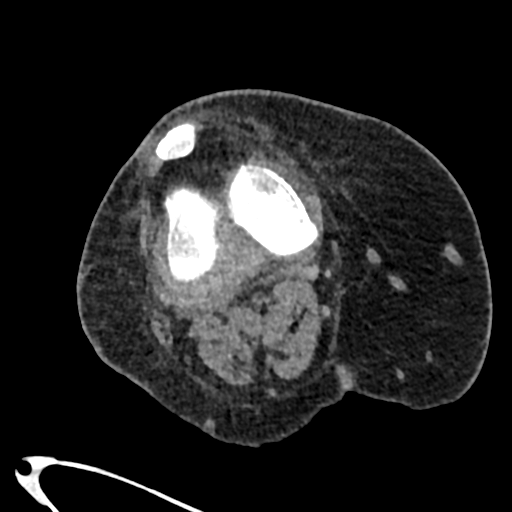
[im 53/114  bone]
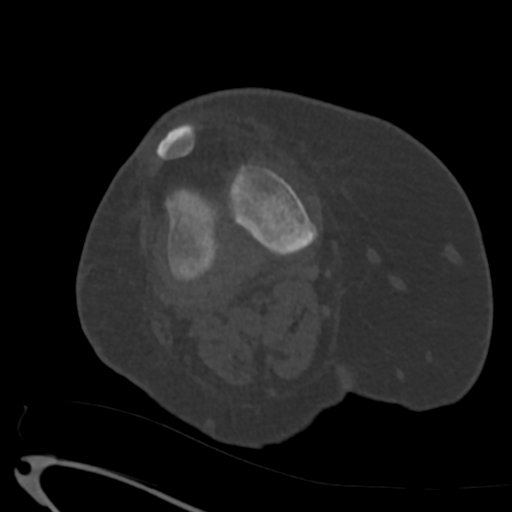
[im 61/114  bone]
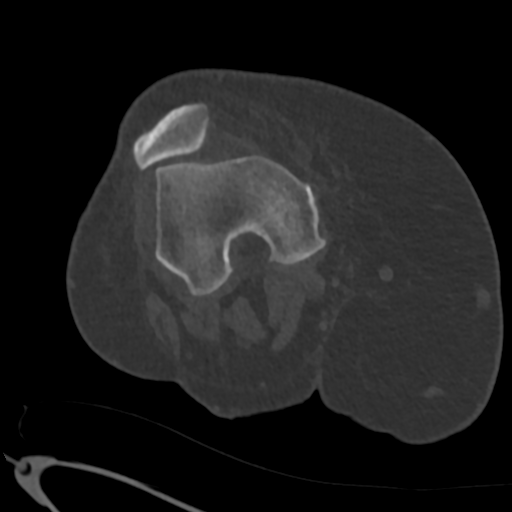
[im 70/114  bone]
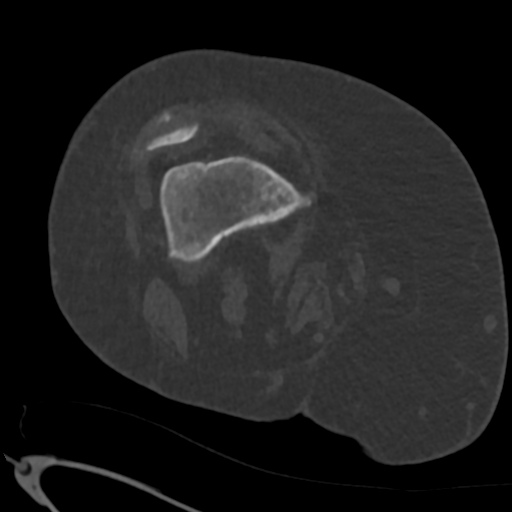
[im 87/114  bone]
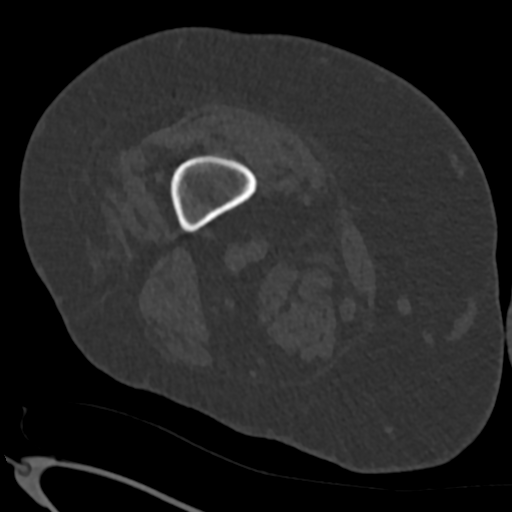
[im 96/114  soft-tissue]
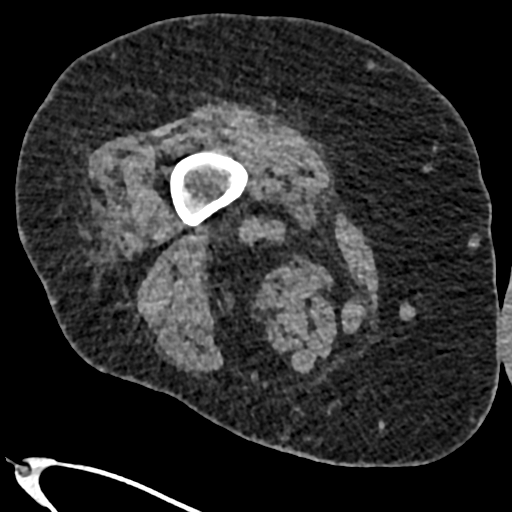
[im 96/114  bone]
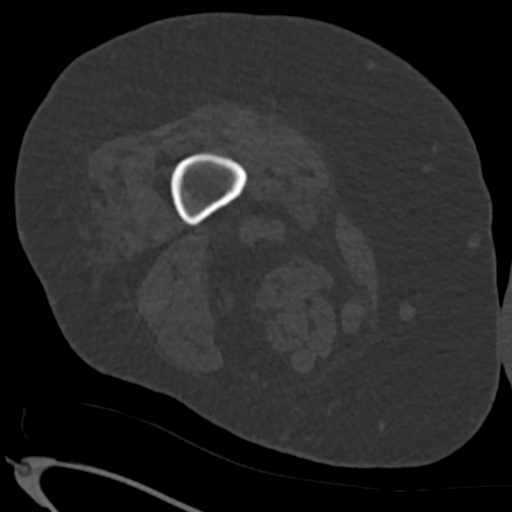
[im 105/114  bone]
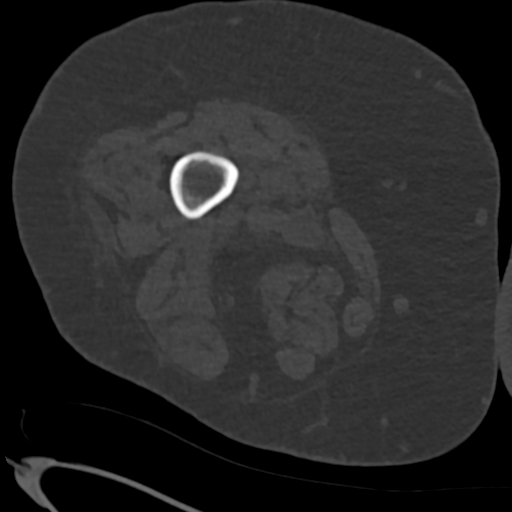

[Series 6: cor bone · coronal · 0.44mm/px · 1 of 104 slices shown]
[im 52/104  bone]
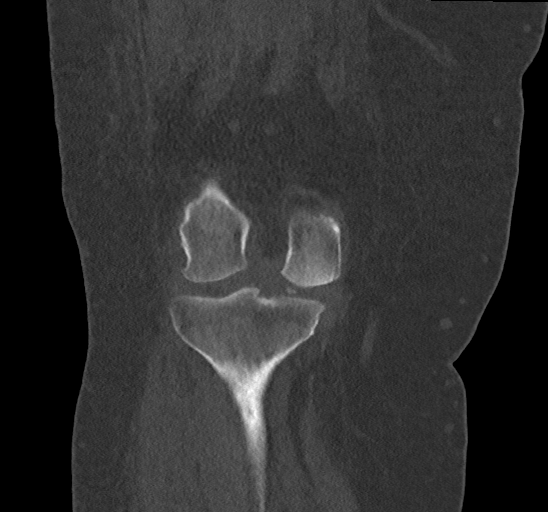

[Series 7: sag bone · sagittal · 0.43mm/px · 5 of 112 slices shown]
[im 19/112  bone]
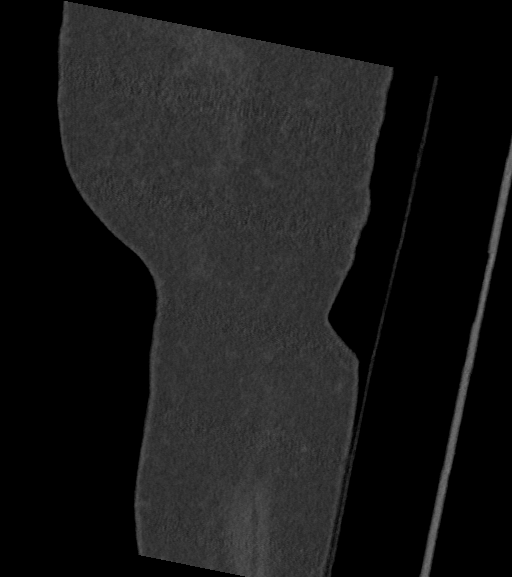
[im 38/112  bone]
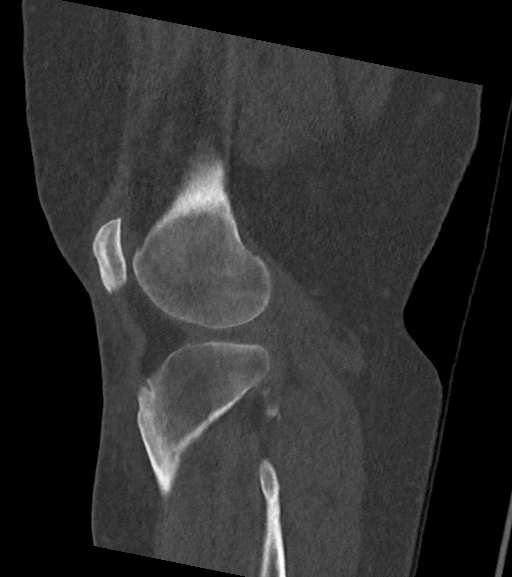
[im 56/112  bone]
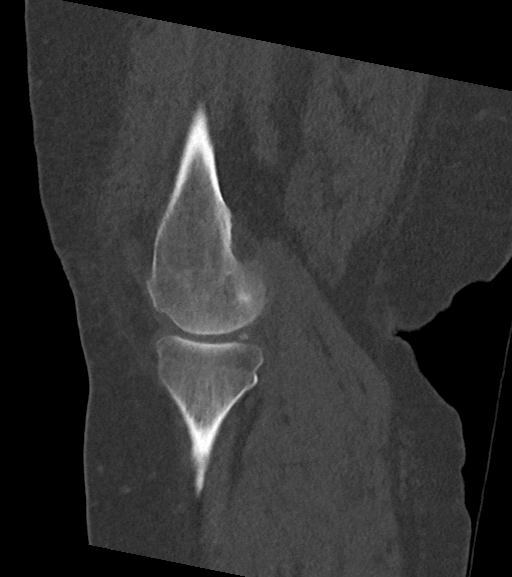
[im 75/112  bone]
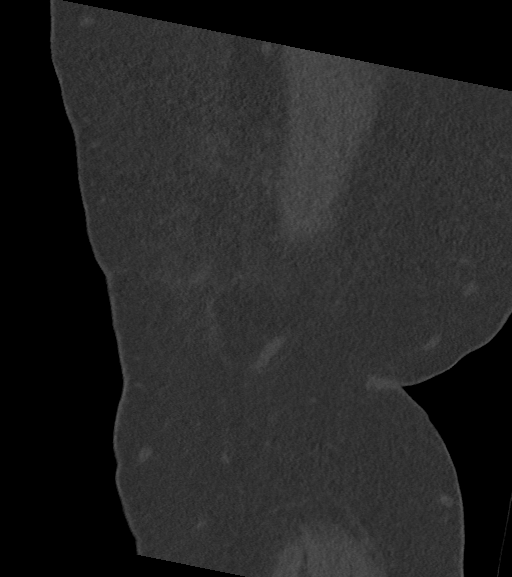
[im 93/112  bone]
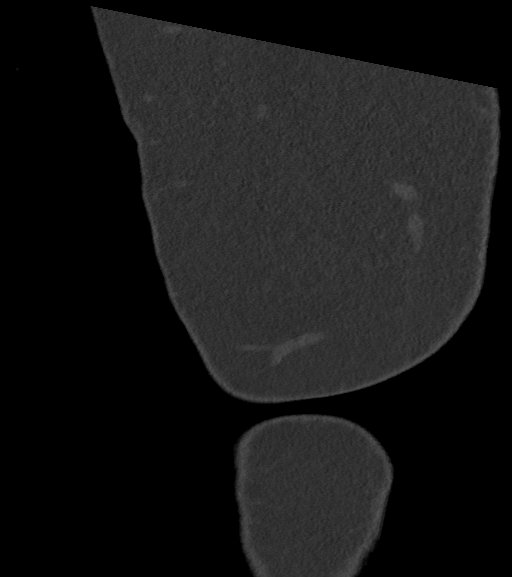

[16 of 33 positions shown; findings below may reference images not displayed]

FINDINGS: Bones/Joint/Cartilage

No fracture or dislocation. No large knee joint effusion is seen.
There is a well corticated ossicle seen within the region of the
posterior medial meniscus which could represent a small loose body.

Ligaments

Suboptimally assessed by CT.

Muscles and Tendons

Enthesophytes seen at the quadriceps insertion site. The patellar
tendon is intact. The muscles are normal appearance without focal
atrophy or tear. There is edema around the medial patellar
retinaculum with slight lateral subluxation of the patella.

Soft tissues

Prepatellar subcutaneous edema is noted. No focal soft tissue mass
is seen.
IMPRESSION: No acute fracture or dislocation.

Edema around the medial patellar retinaculum with slight lateral
subluxation of the patella.

Probable small loose body in the region of the posterior medial
meniscus.

## 2020-09-06 IMAGING — CR DG HIP (WITH OR WITHOUT PELVIS) 2-3V*R*
3 series · 3 of 3 positions shown · non-contrast
Comparison: None.

CLINICAL DATA: Right hip pain after a fall 3 days ago.

EXAM:
DG HIP (WITH OR WITHOUT PELVIS) 2-3V RIGHT

[pelvis ap (1 of 2)]
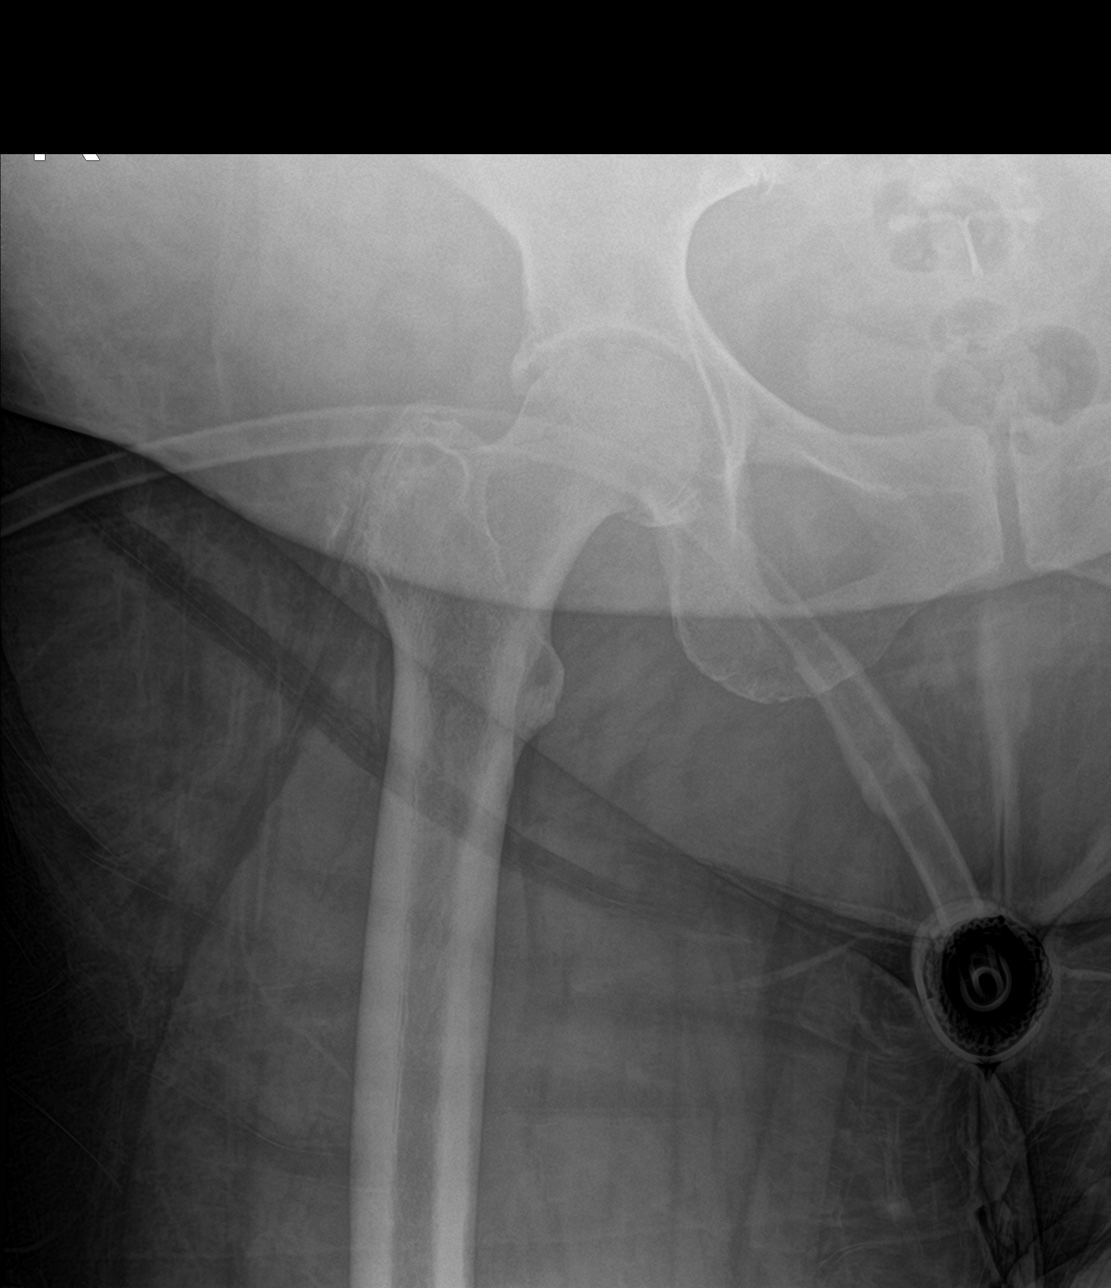

[hip lat]
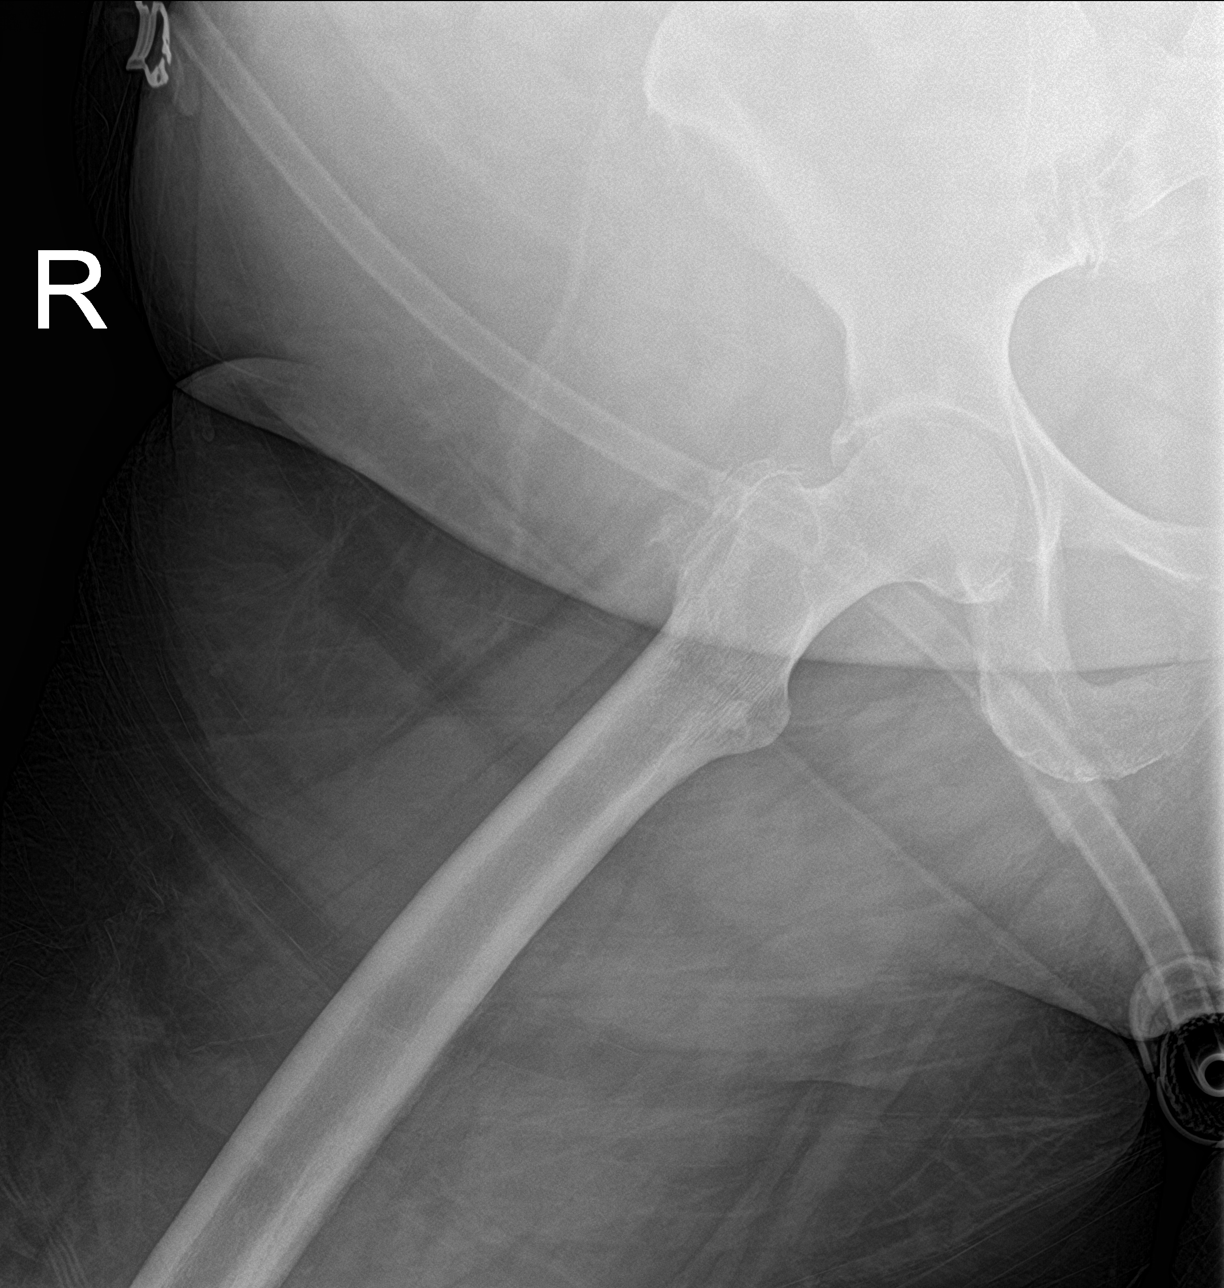

[pelvis ap (2 of 2)]
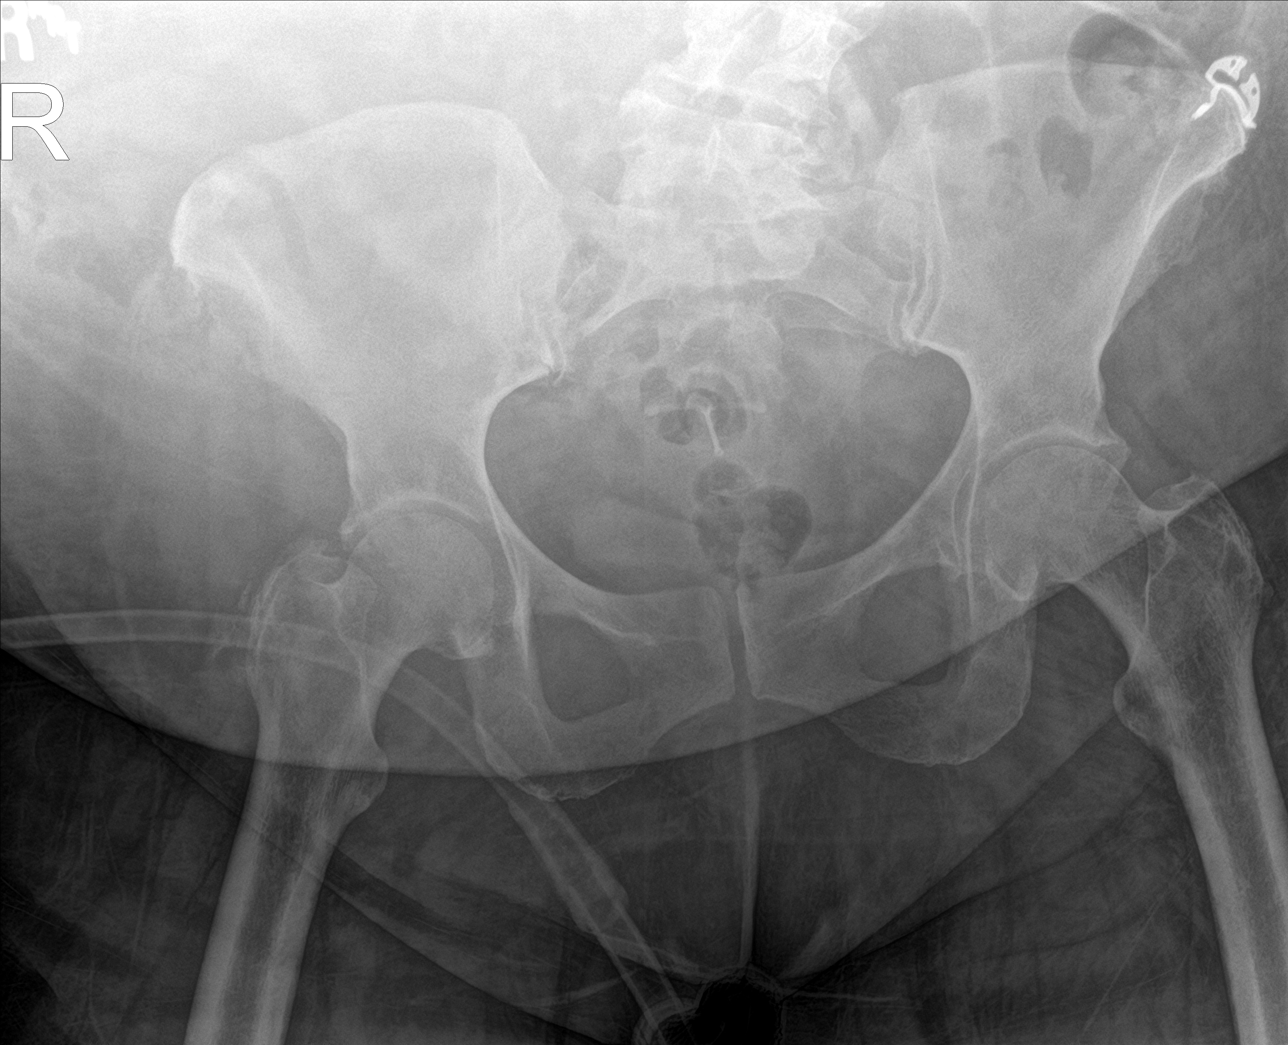

[3 of 3 positions shown; findings below may reference images not displayed]

FINDINGS: Degenerative changes in the right hip with joint space narrowing and
osteophyte formation on both sides of the hip. Heterotopic
ossification over the greater trochanter. No evidence of acute
fracture or dislocation. No focal bone lesions. Soft tissues are
unremarkable.
IMPRESSION: Degenerative changes in the right hip.

## 2020-09-06 MED ORDER — HEPARIN BOLUS VIA INFUSION
4000.0000 [IU] | Freq: Once | INTRAVENOUS | Status: AC
  Administered 2020-09-06: 4000 [IU] via INTRAVENOUS
  Filled 2020-09-06: qty 4000

## 2020-09-06 MED ORDER — VANCOMYCIN HCL 1250 MG/250ML IV SOLN
1250.0000 mg | Freq: Two times a day (BID) | INTRAVENOUS | Status: DC
  Administered 2020-09-07 – 2020-09-09 (×5): 1250 mg via INTRAVENOUS
  Filled 2020-09-06 (×5): qty 250

## 2020-09-06 MED ORDER — WARFARIN SODIUM 2 MG PO TABS
4.0000 mg | ORAL_TABLET | Freq: Once | ORAL | Status: AC
  Administered 2020-09-06: 4 mg via ORAL
  Filled 2020-09-06: qty 2
  Filled 2020-09-06: qty 1

## 2020-09-06 MED ORDER — SODIUM CHLORIDE 0.9 % IV SOLN
INTRAVENOUS | Status: DC | PRN
  Administered 2020-09-06: 250 mL via INTRAVENOUS

## 2020-09-06 MED ORDER — LACTATED RINGERS IV SOLN: INTRAVENOUS | Status: AC

## 2020-09-06 MED ORDER — DIPHENHYDRAMINE-ZINC ACETATE 2-0.1 % EX CREA
TOPICAL_CREAM | Freq: Two times a day (BID) | CUTANEOUS | Status: DC | PRN
  Filled 2020-09-06: qty 28

## 2020-09-06 MED ORDER — ENOXAPARIN SODIUM 40 MG/0.4ML ~~LOC~~ SOLN: 40.0000 mg | SUBCUTANEOUS | Status: DC

## 2020-09-06 MED ORDER — VANCOMYCIN HCL 2000 MG/400ML IV SOLN
2000.0000 mg | Freq: Once | INTRAVENOUS | Status: AC
  Administered 2020-09-06: 2000 mg via INTRAVENOUS
  Filled 2020-09-06: qty 400

## 2020-09-06 MED ORDER — HEPARIN (PORCINE) 25000 UT/250ML-% IV SOLN
2500.0000 [IU]/h | INTRAVENOUS | Status: DC
  Administered 2020-09-06 (×2): 1400 [IU]/h via INTRAVENOUS
  Administered 2020-09-07: 1600 [IU]/h via INTRAVENOUS
  Administered 2020-09-07: 2100 [IU]/h via INTRAVENOUS
  Administered 2020-09-08: 2500 [IU]/h via INTRAVENOUS
  Administered 2020-09-08: 2300 [IU]/h via INTRAVENOUS
  Administered 2020-09-08: 2500 [IU]/h via INTRAVENOUS
  Filled 2020-09-06 (×9): qty 250

## 2020-09-06 MED ORDER — METRONIDAZOLE 500 MG PO TABS: 500.0000 mg | ORAL_TABLET | Freq: Three times a day (TID) | ORAL | Status: DC

## 2020-09-06 MED ORDER — LACTATED RINGERS IV BOLUS (SEPSIS)
1000.0000 mL | Freq: Once | INTRAVENOUS | Status: AC
  Administered 2020-09-06: 1000 mL via INTRAVENOUS

## 2020-09-06 MED ORDER — LACTATED RINGERS IV BOLUS (SEPSIS)
250.0000 mL | Freq: Once | INTRAVENOUS | Status: AC
  Administered 2020-09-06: 250 mL via INTRAVENOUS

## 2020-09-06 MED ORDER — WARFARIN - PHARMACIST DOSING INPATIENT: Freq: Every day | Status: DC

## 2020-09-06 MED ORDER — DIPHENHYDRAMINE HCL 25 MG PO CAPS
25.0000 mg | ORAL_CAPSULE | Freq: Two times a day (BID) | ORAL | Status: DC | PRN
  Administered 2020-09-06 – 2020-09-11 (×8): 25 mg via ORAL
  Filled 2020-09-06 (×8): qty 1

## 2020-09-06 MED ORDER — SODIUM CHLORIDE 0.9 % IV SOLN
2.0000 g | Freq: Three times a day (TID) | INTRAVENOUS | Status: DC
  Administered 2020-09-06 – 2020-09-08 (×8): 2 g via INTRAVENOUS
  Filled 2020-09-06 (×8): qty 2

## 2020-09-06 NOTE — ED Notes (Signed)
Lunch Tray Ordered @ 1038.  

## 2020-09-06 NOTE — Progress Notes (Addendum)
ANTICOAGULATION CONSULT NOTE - Initial Consult  Pharmacy Consult for heparin and warfarin Indication: AVR and h/o TIA  Allergies  Allergen Reactions  . Vancomycin Itching    Patient Measurements: Height: 5\' 1"  (154.9 cm) Weight: 120.9 kg (266 lb 8.6 oz) IBW/kg (Calculated) : 47.8 Heparin Dosing Weight: 78kg  Vital Signs: Temp: 98.6 F (37 C) (12/29 1532) Temp Source: Oral (12/29 1532) BP: 123/63 (12/29 1532) Pulse Rate: 81 (12/29 1532)  Labs: Recent Labs    09/06/20 0012 09/06/20 0528 09/06/20 1318  HGB 13.3 15.9*  --   HCT 41.7 51.9*  --   PLT 254 215  --   LABPROT 15.0  --   --   INR 1.2  --   --   HEPARINUNFRC  --   --  <0.10*  CREATININE 0.93 0.79  --   CKTOTAL 3,952* 4,082*  --     Estimated Creatinine Clearance: 85.2 mL/min (by C-G formula based on SCr of 0.79 mg/dL).   Medical History: Past Medical History:  Diagnosis Date  . Anemia 06/03/2019  . Aortic stenosis 03/23/2013   Overview:  02/18/13 TTE EF >55%. Critical AS with mean Ao valve gradient of 82 mm Hg. No AI. No MR, PR, mild TR. Estimated RVSP 30 mm Hg.  . Bicuspid aortic valve   . CAD (coronary artery disease) 06/03/2019  . Chronic diastolic CHF (congestive heart failure) (HCC) 06/03/2019  . Dysfunctional uterine bleeding 04/22/2018  . Edema of both legs 02/20/2017  . Encounter for insertion of mirena IUD 08/17/2019   Mirena IUD / 52mg  Levonorgestrel Insertion Date: 08/17/19 S/N: Exp: 1/23 Lot: [id number]  . Essential hypertension 06/03/2019  . 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 Heart failure (HCC)   . HTN (hypertension) 03/23/2013  . 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 Hypertensive heart disease with heart failure (HCC) 03/23/2013  . Iron deficiency anemia due to chronic blood loss 10/19/2019  . Long term (current) use of anticoagulants 06/11/2019  . Morbid obesity (HCC) 02/20/2017  . Obstructive hypertrophic cardiomyopathy (HCC) 07/10/2018  . Postmenopausal bleeding 04/16/2018    Added automatically from request for surgery 13/09/2017  Last Assessment & Plan:  1. Postmenopausal bleeding since for the past 2 years.  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 Last biopsy was in May of 2019 which showed weakly proliferative endometrium possibly a polyp.    . Sleep apnea   . TIA (transient ischemic attack) 06/04/2019  . Transient neurologic deficit 06/03/2019    Assessment: 64yo female admitted for cellulitis/sepsis after leg was pinned between bed and wall, to continue Coumadin for h/o tissue AVR and TIA, to start heparin bridge for INR 1.2.  First HL undetectable.  No issues with infusion or bleeding per RN. Will give another bolus and increase rate.   Warfarin PTA dose 2.5mg  MWF, 1.25mg  TTSS  Goal of Therapy:  INR 2-3 Heparin level 0.3-0.7 units/ml Monitor platelets by anticoagulation protocol: Yes   Plan:  Heparin 4000 unit bolus and increase heparin gtt to 1600 units/hr   F/u 6hr HL Warfarin 4mg  x1 given Monitor daily INR, HL, CBC/plt Monitor for signs/symptoms of bleeding    06/06/2019, PharmD, BCPS, BCCP Clinical Pharmacist  Please check AMION for all [facility] Pharmacy phone numbers After 10:00 PM, call Main Pharmacy [phone]

## 2020-09-06 NOTE — ED Notes (Signed)
Attempted to call report

## 2020-09-06 NOTE — Plan of Care (Signed)
  Problem: Education: Goal: Ability to demonstrate management of disease process will improve Outcome: Progressing   Problem: Education: Goal: Ability to verbalize understanding of medication therapies will improve Outcome: Progressing   Problem: Health Behavior/Discharge Planning: Goal: Ability to manage health-related needs will improve Outcome: Progressing   

## 2020-09-06 NOTE — H&P (Addendum)
History and Physical    [name] [id number] DOB: 23-Oct-1955 DOA: 09/05/2020  PCP: [name], MD  Patient coming from: Home  I have personally briefly reviewed patient's old medical records in [place]  Chief Complaint: Cellulitis, fall  HPI: [name] is a 64 y.o. female with medical history significant of dCHF, chronic lymphedema of BLE L>R, CAD, AS s/p AVR with bioprosthetic, TIA, on chronic coumadin with goal INR 2-3.  Pt presents to the ED with 2 issues:  First she has worsening LLE erythema.  Onset 6 days ago, progressively worsening, now severe.  Nothing tried for symptoms, nothing makes better or worse.    Was planning on coming to ER for symptoms, but had a slop off of bed earlier today, and got stuck between bed and wall for 4 hours.  RIGHT leg was stuck between bed and wall.  Now has RIGHT knee pain and erythema and edema.  No CP, no SOB, no dysuria.   ED Course: Pt with HR 111, no fever, but WBC 23.8k.  Lactate 1.7.  Given 250cc IVF bolus.  Given cefepime and dapto (allergy to vanc) for cellulitis in setting of sepsis.  No subq emphysema on imaging.  COVID pending.   Review of Systems: As per HPI, otherwise all review of systems negative.  Past Medical History:  Diagnosis Date  . Anemia 06/03/2019  . Aortic stenosis 03/23/2013   Overview:  02/18/13 TTE EF >55%. Critical AS with mean Ao valve gradient of 82 mm Hg. No AI. No MR, PR, mild TR. Estimated RVSP 30 mm Hg.  . Bicuspid aortic valve   . CAD (coronary artery disease) 06/03/2019  . Chronic diastolic CHF (congestive heart failure) ([place]) 06/03/2019  . Dysfunctional uterine bleeding 04/22/2018  . Edema of both legs 02/20/2017  . Encounter for insertion of mirena IUD 08/17/2019   Mirena IUD / 52mg  Levonorgestrel Insertion Date: 08/17/19 S/N: [id number] Exp: 1/23 Lot: [id number]  . Essential hypertension 06/03/2019  . 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 Heart  failure ([place])   . HTN (hypertension) 03/23/2013  . 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 Hypertensive heart disease with heart failure ([place]) 03/23/2013  . Iron deficiency anemia due to chronic blood loss 10/19/2019  . Long term (current) use of anticoagulants 06/11/2019  . Morbid obesity ([place]) 02/20/2017  . Obstructive hypertrophic cardiomyopathy ([place]) 07/10/2018  . Postmenopausal bleeding 04/16/2018   Added automatically from request for surgery [id number]  Last Assessment & Plan:  1. Postmenopausal bleeding since for the past 2 years.  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 Last biopsy was in May of 2019 which showed weakly proliferative endometrium possibly a polyp.    . Sleep apnea   . TIA (transient ischemic attack) 06/04/2019  . Transient neurologic deficit 06/03/2019    Past Surgical History:  Procedure Laterality Date  . BUBBLE STUDY  06/07/2019   Procedure: BUBBLE STUDY;  Surgeon: Buford Dresser, MD;  Location: [place];  Service: Cardiovascular;;  . CHOLECYSTECTOMY  1983  . RIGHT/LEFT HEART CATH AND CORONARY ANGIOGRAPHY N/A 04/01/2019   Procedure: RIGHT/LEFT HEART CATH AND CORONARY ANGIOGRAPHY;  Surgeon: Lorretta Harp, MD;  Location: [place] [facility];  Service: Cardiovascular;  Laterality: N/A;  . TEE WITHOUT CARDIOVERSION N/A 07/01/2018   Procedure: TRANSESOPHAGEAL ECHOCARDIOGRAM (TEE);  Surgeon: Buford Dresser, MD;  Location: [facility]  ENDOSCOPY;  Service: Cardiovascular;  Laterality: N/A;  . TEE WITHOUT CARDIOVERSION N/A 06/07/2019   Procedure: TRANSESOPHAGEAL ECHOCARDIOGRAM (TEE);  Surgeon: Buford Dresser, MD;  Location: [facility] ENDOSCOPY;  Service: Cardiovascular;  Laterality: N/A;  . TISSUE AORTIC VALVE REPLACEMENT       reports that she has never smoked. She has never used smokeless tobacco. She reports that she does not drink alcohol and does not  use drugs.  Allergies  Allergen Reactions  . Vancomycin Itching    Family History  Problem Relation Age of Onset  . Parkinson's disease Father   . Heart attack Mother      Prior to Admission medications   Medication Sig Start Date End Date Taking? Authorizing Provider  camphor-menthol Timoteo Ace) lotion Apply 1 application topically as needed for itching. 06/09/19   Kayleen Memos, DO  levonorgestrel (MIRENA) 20 MCG/24HR IUD 1 each by Intrauterine route once.    [provider]  metoprolol succinate (TOPROL XL) 25 MG 24 hr tablet Take 2 tablets (50 mg total) by mouth in the morning AND 1 tablet (25 mg total) every evening. 03/28/20   Larey Dresser, MD  MYRBETRIQ 25 MG TB24 tablet Take 25 mg by mouth daily. 02/24/20   [provider]  potassium chloride (KLOR-CON) 10 MEQ tablet Take 1 tablet (10 mEq total) by mouth 2 (two) times daily. 09/16/19 06/06/20  Richardo Priest, MD  pravastatin (PRAVACHOL) 40 MG tablet Take 1 tablet (40 mg total) by mouth daily at 6 PM. 06/09/19   Kayleen Memos, DO  ramelteon (ROZEREM) 8 MG tablet Take 1 tablet (8 mg total) by mouth at bedtime. Patient not taking: Reported on 03/28/2020 06/21/19   Laurin Coder, MD  torsemide (DEMADEX) 20 MG tablet Take 3 tablets (60 mg total) by mouth daily. 03/28/20   Larey Dresser, MD  triamcinolone cream (KENALOG) 0.1 % APPLY THIN FILM OF CREAM OVER THE LEG WITH EACH WRAP CHANGE. NOT TO BE PLACED IN THE WOUNDS 10/21/19   [provider]  warfarin (COUMADIN) 2.5 MG tablet Take 1 tablet by mouth once daily 04/28/20   Richardo Priest, MD    Physical Exam: Vitals:   09/05/20 2210 09/06/20 0023 09/06/20 0134  BP: (!) 144/62 138/64 (!) 137/53  Pulse: (!) 106 (!) 104 (!) 107  Resp: 20 16 (!) 22  Temp: 98.6 F (37 C)    TempSrc: Oral    SpO2: 95% 94% 94%    Constitutional: NAD, calm, comfortable Eyes: PERRL, lids and conjunctivae normal ENMT: Mucous membranes are moist. Posterior pharynx clear of  any exudate or lesions.Normal dentition.  Neck: normal, supple, no masses, no thyromegaly Respiratory: clear to auscultation bilaterally, no wheezing, no crackles. Normal respiratory effort. No accessory muscle use.  Cardiovascular: Regular rate and rhythm, no murmurs / rubs / gallops. No extremity edema. 2+ pedal pulses. No carotid bruits.  Abdomen: no tenderness, no masses palpated. No hepatosplenomegaly. Bowel sounds positive.  Musculoskeletal: no clubbing / cyanosis. No joint deformity upper and lower extremities. Good ROM, no contractures. Normal muscle tone.  Skin:      Neurologic: CN 2-12 grossly intact. Sensation intact, DTR normal. Strength 5/5 in all 4.  Psychiatric: Normal judgment and insight. Alert and oriented x 3. Normal mood.    Labs on Admission: I have personally reviewed following labs and imaging studies  CBC: Recent Labs  Lab 09/06/20 0012  WBC 23.8*  NEUTROABS 20.3*  HGB 13.3  HCT 41.7  MCV 85.1  PLT  0000000   Basic Metabolic Panel: Recent Labs  Lab 09/06/20 0012  NA 129*  K 3.9  CL 95*  CO2 25  GLUCOSE 122*  BUN 18  CREATININE 0.93  CALCIUM 9.5   GFR: CrCl cannot be calculated (Unknown ideal weight.). Liver Function Tests: Recent Labs  Lab 09/06/20 0012  AST 83*  ALT 34  ALKPHOS 59  BILITOT 0.9  PROT 7.3  ALBUMIN 2.2*   No results for input(s): LIPASE, AMYLASE in the last 168 hours. No results for input(s): AMMONIA in the last 168 hours. Coagulation Profile: Recent Labs  Lab 09/06/20 0012  INR 1.2   Cardiac Enzymes: Recent Labs  Lab 09/06/20 0012  CKTOTAL 3,952*   BNP (last 3 results) Recent Labs    06/06/20 0903  PROBNP 1,079*   HbA1C: No results for input(s): HGBA1C in the last 72 hours. CBG: No results for input(s): GLUCAP in the last 168 hours. Lipid Profile: No results for input(s): CHOL, HDL, LDLCALC, TRIG, CHOLHDL, LDLDIRECT in the last 72 hours. Thyroid Function Tests: No results for input(s): TSH, T4TOTAL,  FREET4, T3FREE, THYROIDAB in the last 72 hours. Anemia Panel: No results for input(s): VITAMINB12, FOLATE, FERRITIN, TIBC, IRON, RETICCTPCT in the last 72 hours. Urine analysis: No results found for: COLORURINE, APPEARANCEUR, LABSPEC, [place], GLUCOSEU, HGBUR, BILIRUBINUR, KETONESUR, PROTEINUR, UROBILINOGEN, NITRITE, LEUKOCYTESUR  Radiological Exams on Admission: DG Knee 2 Views Right  Result Date: 09/05/2020 CLINICAL DATA:  Substance fell, right knee pain EXAM: RIGHT KNEE - 1-2 VIEW COMPARISON:  None. FINDINGS: Frontal and cross-table lateral views of the right knee demonstrate moderate medial compartmental joint space narrowing. There is spurring of the medial and patellofemoral compartments. No fracture, subluxation, or dislocation. No joint effusion. IMPRESSION: 1. Medial and patellofemoral compartmental osteoarthritis. No acute fracture. Electronically Signed   By: Randa Ngo M.D.   On: 09/05/2020 23:13   DG Tibia/Fibula Left  Result Date: 09/05/2020 CLINICAL DATA:  Left leg swelling EXAM: LEFT TIBIA AND FIBULA - 2 VIEW COMPARISON:  None. FINDINGS: There is no evidence of fracture or other focal bone lesions. Diffuse subcutaneous edema seen surrounding lower extremity. IMPRESSION: No acute osseous abnormality. Diffuse subcu edema seen surrounding the lower extremity. Electronically Signed   By: Prudencio Pair M.D.   On: 09/05/2020 23:15   CT Knee Right Wo Contrast  Result Date: 09/06/2020 CLINICAL DATA:  Fall, injury EXAM: CT OF THE right KNEE WITHOUT CONTRAST TECHNIQUE: Multidetector CT imaging of the right knee was performed according to the standard protocol. Multiplanar CT image reconstructions were also generated. COMPARISON:  None. FINDINGS: Bones/Joint/Cartilage No fracture or dislocation. No large knee joint effusion is seen. There is a well corticated ossicle seen within the region of the posterior medial meniscus which could represent a small loose body. Ligaments Suboptimally  assessed by CT. Muscles and Tendons Enthesophytes seen at the quadriceps insertion site. The patellar tendon is intact. The muscles are normal appearance without focal atrophy or tear. There is edema around the medial patellar retinaculum with slight lateral subluxation of the patella. Soft tissues Prepatellar subcutaneous edema is noted. No focal soft tissue mass is seen. IMPRESSION: No acute fracture or dislocation. Edema around the medial patellar retinaculum with slight lateral subluxation of the patella. Probable small loose body in the region of the posterior medial meniscus. Electronically Signed   By: Prudencio Pair M.D.   On: 09/06/2020 00:44   DG Femur Min 2 Views Left  Result Date: 09/05/2020 CLINICAL DATA:  Slipped and fell, left leg  swelling EXAM: LEFT FEMUR 2 VIEWS COMPARISON:  None. FINDINGS: Frontal and lateral views of the left femur are obtained. No acute fractures. Alignment of the left hip and knee is anatomic. There is mild osteoarthritis of the left hip and medial compartment of the left knee. Visualized portions of the left hemipelvis are unremarkable. IMPRESSION: 1. No acute displaced fracture. 2. 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 Electronically Signed   By: Randa Ngo M.D.   On: 09/05/2020 23:13    EKG: Independently reviewed.  Assessment/Plan Principal Problem:   Sepsis due to cellulitis [facility]) Active Problems:   Edema of both legs   H/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   Morbid obesity (HCC)   Chronic diastolic CHF (congestive heart failure) ([place])    1. Sepsis due to cellulitis - 1. Sepsis pathway 2. Cefepime, and dose of dapto in ED 3. MRSA PCR nares pending 4. BCx pending 5. Consult ID in AM on wether to continue dapto or other ABx empirically (pt allergic to vanc). 6. Wound care consult for leg 7. No subq emphysema, dont think she has Geneticist, molecular. 2. Mild rhabdo - 1. CPK only 3900 2. UA pending, urine looks very dark 3. IVF: giving another  1L bolus now (1.25L total bolus volume) and 150 cc/hr LR for the moment 4. Repeat CPK in AM 5. Strict intake and output 3. dCHF - 1. Holding diuretics 2. Holding metoprolol 3. Coumadin per pharm consult, on coumadin chronically for stroke prevention, currently INR 1.2, goal INR 2-3 per patient. 4. Will put on heparin bridge for the moment  DVT prophylaxis: heparin per pharm bridge to coumadin Code Status: Full Family Communication: Husband at bedside Disposition Plan: Home after sepsis and rhabdo resolved Consults called: None Admission status: Admit to inpatient  Severity of Illness: The appropriate patient status for this patient is INPATIENT. Inpatient status is judged to be reasonable and necessary in order to provide the required intensity of service to ensure the patient's safety. The patient's presenting symptoms, physical exam findings, and initial radiographic and laboratory data in the context of their chronic comorbidities is felt to place them at high risk for further clinical deterioration. Furthermore, it is not anticipated that the patient will be medically stable for discharge from the hospital within 2 midnights of admission. The following factors support the patient status of inpatient.   IP status due to cellulitis causing sepsis.  Needs IV ABx.  Also rhabdo needs IVF.  * I certify that at the point of admission it is my clinical judgment that the patient will require inpatient hospital care spanning beyond 2 midnights from the point of admission due to high intensity of service, high risk for further deterioration and high frequency of surveillance required.*    Tru Leopard M. DO [facility]  How to contact the [facility] Attending or Consulting provider [place] or covering provider during after hours [place], for this patient?  1. Check the care team in [facility] and look for a) attending/consulting [facility] provider listed and b) the [facility] team listed 2. Log into  [email or web address]  Amion Physician Scheduling and messaging for groups and whole hospitals  On call and physician scheduling software for group practices, residents, hospitalists and other medical providers for call, clinic, rotation and shift schedules. OnCall [employer] is a hospital-wide system for scheduling doctors and paging doctors on call. EasyPlot is for scientific plotting and data analysis.  [email or web address]  and use 's universal password to access.  If you do not have the password, please contact the hospital operator.  3. Locate the [facility] provider you are looking for under [facility] and page to a number that you can be directly reached. 4. If you still have difficulty reaching the provider, please page the [facility] (Director on Call) for the Hospitalists listed on amion for assistance.  09/06/2020, 3:44 AM

## 2020-09-06 NOTE — Progress Notes (Signed)
Pharmacy Antibiotic Note  [name] is a 63 y.o. female admitted on 09/05/2020 with cellulitis.  Pharmacy has been consulted for cefpeime dosing.  Plan: Cefepime 2g IV Q8H.   Temp (24hrs), Avg:98.6 F (37 C), Min:98.6 F (37 C), Max:98.6 F (37 C)  Recent Labs  Lab 09/06/20 0012 09/06/20 0214  WBC 23.8*  --   CREATININE 0.93  --   LATICACIDVEN 1.7 1.6    Estimated Creatinine Clearance: 74.1 mL/min (by C-G formula based on SCr of 0.93 mg/dL).    Allergies  Allergen Reactions  . Vancomycin Itching     Thank you for allowing pharmacy to be a part of this patient's care.  Vernard Gambles, PharmD, BCPS  09/06/2020 3:48 AM

## 2020-09-06 NOTE — Sepsis Progress Note (Signed)
Monitoring for sepsis protocol. °

## 2020-09-06 NOTE — Progress Notes (Signed)
Care started prior to midnight in the emergency room and patient is admitted early this morning by Dr. Lyda Perone and I am in current agreement with this assessment and plan.  Additional changes to the plan of care been made accordingly.  The patient is a super morbidly obese Caucasian female with a past medical history significant for but not limited to chronic lymphedema of the bilateral lower extremities with left being greater than right, CAD, history of aortic stenosis status post AVR with bioprosthetic valve on chronic anticoagulation with Coumadin with a goal INR 2-3, history of TIA, as well as other comorbidities who presented after a fall and was found to have worsening left lower extremity erythema.  Patient states the onset was about a week ago and progressively gotten worse and was severe.  She slipped off the bed and got stuck between the bed and the wall for approximately hours.  Now complains of right knee and hip pain.  Left leg was worse with erythema and edema.  Please see the pictures and Dr. Boston Service note.  She is currently being admitted for and treated for the following but not limited to:   Sepsis due to LLE cellulitis  -Sepsis pathway -Cefepime, placed on daptomycin in the ED but this is may change to IV vancomycin given that her allergy is just itching -MRSA PCR nares pending -BCx pending -PCT was 2.10; WBC was 23.8 and is improved to 16.7 -Wound care consult for leg -No subq emphysema, dont think she has [employer].  Rhabdomyolysis -CPK only 3900 and only worsened a little bit -UA pending, urine looks very dark -IVF: giving another 1L bolus now (1.25L total bolus volume) and 150 cc/hr LR for the moment -Repeat CPK in AM -Strict intake and output  Chronic dCHF  History of Aortic stenosis status post AVR bioprosthetic valve -Holding diuretics -Holding metoprolol -Coumadin per pharm consult, on coumadin chronically for stroke prevention, currently INR 1.2, goal INR 2-3  per patient.  -Will put on heparin bridge for the moment  Hyponatremia -Mild and improving -Na+ went from 129 -> 131 -C/w IVF Hydration -Continue to Monitor and Trend  Super Morbid Obesity -Complicates overall prognosis and care -Estimated body mass index is 50.36 kg/m as calculated from the following:   Height as of this encounter: 5\' 1"  (1.549 m).   Weight as of this encounter: 120.9 kg. -Weight loss and dietary counseling given  Will obtain right hip x-ray and change her antibiotics from IV daptomycin to IV vancomycin.  We will continue to monitor cultures and follow-up with blood work in the a.m.  Will also consult wound care for further evaluation with IV fluid hydration for now and repeat a CK level in the morning.

## 2020-09-06 NOTE — Consult Note (Addendum)
WOC Nurse Consult Note: Patient receiving care in [facility] ED005 Consult completed remotely after review of chart and review of photos.  Reason for Consult: LE Cellulitis, wounds Wound type: Unclear etiology at this time. Bilateral erythema and punched out appearing ulcers possiblly indicative with arterial insufficiency. Possible infectious process. ID has been consulted and will wait for their input in this case.  Pressure Injury POA: NA Drainage (amount, consistency, odor) None Periwound: Erythmatous edema Dressing procedure/placement/frequency: May apply Xeroform gauze [place] # [phone]) to the punched out appearing ulcers and secure with Kerlex. Would not recommend any compression until further work up.   Monitor the wound area(s) for worsening of condition such as: Signs/symptoms of infection, increase in size, development of or worsening of odor, development of pain, or increased pain at the affected locations.   Notify the medical team if any of these develop.  Thank you for the consult. WOC nurse will not follow at this time.   Please re-consult the WOC team if needed.  Renaldo Reel Katrinka Blazing, MSN, RN, CMSRN, Angus Seller, [facility] Wound Treatment Associate Pager [phone]

## 2020-09-06 NOTE — Progress Notes (Signed)
ANTICOAGULATION CONSULT NOTE - Initial Consult  Pharmacy Consult for heparin and warfarin Indication: AVR and h/o TIA  Allergies  Allergen Reactions  . Vancomycin Itching    Patient Measurements: Height: 5\' 1"  (154.9 cm) Weight: 122.9 kg (271 lb) IBW/kg (Calculated) : 47.8 Heparin Dosing Weight: 80kg  Vital Signs: Temp: 98.6 F (37 C) (12/28 2210) Temp Source: Oral (12/28 2210) BP: 137/53 (12/29 0134) Pulse Rate: 107 (12/29 0134)  Labs: Recent Labs    09/06/20 0012  HGB 13.3  HCT 41.7  PLT 254  LABPROT 15.0  INR 1.2  CREATININE 0.93  CKTOTAL 3,952*    Estimated Creatinine Clearance: 74.1 mL/min (by C-G formula based on SCr of 0.93 mg/dL).   Medical History: Past Medical History:  Diagnosis Date  . Anemia 06/03/2019  . Aortic stenosis 03/23/2013   Overview:  02/18/13 TTE EF >55%. Critical AS with mean Ao valve gradient of 82 mm Hg. No AI. No MR, PR, mild TR. Estimated RVSP 30 mm Hg.  . Bicuspid aortic valve   . CAD (coronary artery disease) 06/03/2019  . Chronic diastolic CHF (congestive heart failure) (HCC) 06/03/2019  . Dysfunctional uterine bleeding 04/22/2018  . Edema of both legs 02/20/2017  . Encounter for insertion of mirena IUD 08/17/2019   Mirena IUD / 52mg  Levonorgestrel Insertion Date: 08/17/19 S/N: Exp: 1/23 Lot: [id number]  . Essential hypertension 06/03/2019  . 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 Heart failure (HCC)   . HTN (hypertension) 03/23/2013  . 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 Hypertensive heart disease with heart failure (HCC) 03/23/2013  . Iron deficiency anemia due to chronic blood loss 10/19/2019  . Long term (current) use of anticoagulants 06/11/2019  . Morbid obesity (HCC) 02/20/2017  . Obstructive hypertrophic cardiomyopathy (HCC) 07/10/2018  . Postmenopausal bleeding 04/16/2018   Added automatically from request for surgery 13/09/2017  Last Assessment & Plan:  1. Postmenopausal bleeding since for the past 2 years.   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 Last biopsy was in May of 2019 which showed weakly proliferative endometrium possibly a polyp.    . Sleep apnea   . TIA (transient ischemic attack) 06/04/2019  . Transient neurologic deficit 06/03/2019    Assessment: 64yo female admitted for cellulitis/sepsis after leg was pinned between bed and wall, to continue Coumadin for h/o tissue AVR and TIA, to start heparin bridge for INR 1.2.  Goal of Therapy:  INR 2-3 Heparin level 0.3-0.7 units/ml Monitor platelets by anticoagulation protocol: Yes   Plan:  Will give heparin 4000 units IV bolus x1 followed by gtt at 1400 units/hr and monitor heparin levels and CBC.  Will give boosted Coumadin dose of 4mg  x1 today and monitor INR for dose adjustments.  06/06/2019, PharmD, BCPS  09/06/2020,4:04 AM

## 2020-09-06 NOTE — Progress Notes (Addendum)
Pharmacy Antibiotic Note  [name] is a 64 y.o. female admitted on 09/05/2020 with cellulitis. Pharmacy has been consulted for vancomycin dosing, switching from daptomycin.  WBC 16.7, afebrile. Clcr 85 ml/min. Patient with hx of itching with vancomycin- will run slower. Previous MRSA negative. Also on cefepime per MD.   Plan: Stop daptomycin Vancomycin 2g over 3hr x1 the 1250mg  over 2.5hr q12 hr Cefepime per MD Monitor MRSA PCR, cultures, clinical status, renal fx, vanc levels  Narrow abx as able and f/u duration    Height: 5\' 1"  (154.9 cm) Weight: 120.9 kg (266 lb 8.6 oz) IBW/kg (Calculated) : 47.8  Temp (24hrs), Avg:98.7 F (37.1 C), Min:98.6 F (37 C), Max:98.9 F (37.2 C)  Recent Labs  Lab 09/06/20 0012 09/06/20 0214 09/06/20 0528  WBC 23.8*  --  16.7*  CREATININE 0.93  --  0.79  LATICACIDVEN 1.7 1.6  --     Estimated Creatinine Clearance: 85.2 mL/min (by C-G formula based on SCr of 0.79 mg/dL).    Allergies  Allergen Reactions  . Vancomycin Itching    Antimicrobials this admission: Vanc 12/29  >>  Cefe 12/29>>  Dapto 12/29 CTX 12/29   Microbiology results: 12/29 BCx: pend  12/29 MRSA PCR: pend  Thank you for allowing pharmacy to be a part of this patient's care.  1/30, PharmD, BCPS, BCCP Clinical Pharmacist  Please check AMION for all [facility] phone numbers After 10:00 PM, call Main Pharmacy [phone]

## 2020-09-06 NOTE — Sepsis Progress Note (Signed)
Sepsis protocol monitoring complete. 

## 2020-09-07 DIAGNOSIS — L0390 Cellulitis, unspecified: Secondary | ICD-10-CM | POA: Diagnosis not present

## 2020-09-07 DIAGNOSIS — A419 Sepsis, unspecified organism: Secondary | ICD-10-CM | POA: Diagnosis not present

## 2020-09-07 DIAGNOSIS — R600 Localized edema: Secondary | ICD-10-CM | POA: Diagnosis not present

## 2020-09-07 DIAGNOSIS — I5032 Chronic diastolic (congestive) heart failure: Secondary | ICD-10-CM | POA: Diagnosis not present

## 2020-09-07 LAB — COMPREHENSIVE METABOLIC PANEL
ALT: 41 U/L (ref 0–44)
AST: 85 U/L — ABNORMAL HIGH (ref 15–41)
Albumin: 1.7 g/dL — ABNORMAL LOW (ref 3.5–5.0)
Alkaline Phosphatase: 58 U/L (ref 38–126)
Anion gap: 8 (ref 5–15)
BUN: 9 mg/dL (ref 8–23)
CO2: 22 mmol/L (ref 22–32)
Calcium: 8.8 mg/dL — ABNORMAL LOW (ref 8.9–10.3)
Chloride: 103 mmol/L (ref 98–111)
Creatinine, Ser: 0.68 mg/dL (ref 0.44–1.00)
GFR, Estimated: >60 mL/min (ref >60)
Glucose, Bld: 119 mg/dL — ABNORMAL HIGH (ref 70–99)
Potassium: 3.7 mmol/L (ref 3.5–5.1)
Sodium: 133 mmol/L — ABNORMAL LOW (ref 135–145)
Total Bilirubin: 0.4 mg/dL (ref 0.3–1.2)
Total Protein: 6.2 g/dL — ABNORMAL LOW (ref 6.5–8.1)

## 2020-09-07 LAB — CBC WITH DIFFERENTIAL/PLATELET
Abs Immature Granulocytes: 1.03 10*3/uL — ABNORMAL HIGH (ref 0.00–0.07)
Basophils Absolute: 0.1 10*3/uL (ref 0.0–0.1)
Basophils Relative: 1 %
Eosinophils Absolute: 0.6 10*3/uL — ABNORMAL HIGH (ref 0.0–0.5)
Eosinophils Relative: 3 %
HCT: 38.9 % (ref 36.0–46.0)
Hemoglobin: 12 g/dL (ref 12.0–15.0)
Immature Granulocytes: 5 %
Lymphocytes Relative: 14 %
Lymphs Abs: 2.8 10*3/uL (ref 0.7–4.0)
MCH: 26.7 pg (ref 26.0–34.0)
MCHC: 30.8 g/dL (ref 30.0–36.0)
MCV: 86.6 fL (ref 80.0–100.0)
Monocytes Absolute: 0.6 10*3/uL (ref 0.1–1.0)
Monocytes Relative: 3 %
Neutro Abs: 14.1 10*3/uL — ABNORMAL HIGH (ref 1.7–7.7)
Neutrophils Relative %: 74 %
Platelets: 273 10*3/uL (ref 150–400)
RBC: 4.49 MIL/uL (ref 3.87–5.11)
RDW: 16.5 % — ABNORMAL HIGH (ref 11.5–15.5)
WBC: 19.2 10*3/uL — ABNORMAL HIGH (ref 4.0–10.5)
nRBC: 0 % (ref 0.0–0.2)

## 2020-09-07 LAB — PHOSPHORUS: Phosphorus: 3.1 mg/dL (ref 2.5–4.6)

## 2020-09-07 LAB — MAGNESIUM: Magnesium: 2.1 mg/dL (ref 1.7–2.4)

## 2020-09-07 LAB — HEPARIN LEVEL (UNFRACTIONATED)
Heparin Unfractionated: <0.1 IU/mL — ABNORMAL LOW (ref 0.30–0.70)
Heparin Unfractionated: 0.13 IU/mL — ABNORMAL LOW (ref 0.30–0.70)
Heparin Unfractionated: 0.2 IU/mL — ABNORMAL LOW (ref 0.30–0.70)

## 2020-09-07 LAB — PROTIME-INR
INR: 1.3 — ABNORMAL HIGH (ref 0.8–1.2)
Prothrombin Time: 15.5 seconds — ABNORMAL HIGH (ref 11.4–15.2)

## 2020-09-07 LAB — CK: Total CK: 1725 U/L — ABNORMAL HIGH (ref 38–234)

## 2020-09-07 LAB — MRSA PCR SCREENING: MRSA by PCR: POSITIVE — AB

## 2020-09-07 MED ORDER — HEPARIN BOLUS VIA INFUSION
2000.0000 [IU] | Freq: Once | INTRAVENOUS | Status: AC
  Administered 2020-09-07: 2000 [IU] via INTRAVENOUS
  Filled 2020-09-07: qty 2000

## 2020-09-07 MED ORDER — MUPIROCIN 2 % EX OINT
1.0000 "application " | TOPICAL_OINTMENT | Freq: Two times a day (BID) | CUTANEOUS | Status: AC
  Administered 2020-09-07 – 2020-09-11 (×10): 1 via NASAL
  Filled 2020-09-07: qty 22

## 2020-09-07 MED ORDER — WARFARIN SODIUM 2.5 MG PO TABS
2.5000 mg | ORAL_TABLET | Freq: Once | ORAL | Status: AC
  Administered 2020-09-07: 2.5 mg via ORAL
  Filled 2020-09-07: qty 1

## 2020-09-07 MED ORDER — CHLORHEXIDINE GLUCONATE CLOTH 2 % EX PADS
6.0000 | MEDICATED_PAD | Freq: Every day | CUTANEOUS | Status: AC
  Administered 2020-09-07 – 2020-09-11 (×3): 6 via TOPICAL

## 2020-09-07 MED ORDER — ACETAMINOPHEN 325 MG PO TABS
650.0000 mg | ORAL_TABLET | Freq: Four times a day (QID) | ORAL | Status: DC | PRN
  Administered 2020-09-07 – 2020-09-11 (×2): 650 mg via ORAL
  Filled 2020-09-07 (×2): qty 2

## 2020-09-07 NOTE — Progress Notes (Signed)
ANTICOAGULATION CONSULT NOTE Pharmacy Consult for heparin and warfarin Indication: h/o TIA  Allergies  Allergen Reactions  . Vancomycin Itching    Patient Measurements: Height: 5\' 1"  (154.9 cm) Weight: 124.5 kg (274 lb 7.6 oz) IBW/kg (Calculated) : 47.8 Heparin Dosing Weight: 78kg  Vital Signs: Temp: 98.9 F (37.2 C) (12/30 0341) Temp Source: Oral (12/30 0341) BP: 130/61 (12/30 0341) Pulse Rate: 95 (12/30 0341)  Labs: Recent Labs    09/06/20 0012 09/06/20 0528 09/06/20 1318 09/07/20 0251  HGB 13.3 15.9*  --   --   HCT 41.7 51.9*  --   --   PLT 254 215  --   --   LABPROT 15.0  --   --  15.5*  INR 1.2  --   --  1.3*  HEPARINUNFRC  --   --  <0.10* <0.10*  CREATININE 0.93 0.79  --  0.68  CKTOTAL 3,952* 4,082*  --  1,725*    Estimated Creatinine Clearance: 86.9 mL/min (by C-G formula based on SCr of 0.68 mg/dL).  Assessment: 64 y.o. female with h/o cardioembolic TIA for anticoagulation   Goal of Therapy:  INR 2-3 Heparin level 0.3-0.7 units/ml Monitor platelets by anticoagulation protocol: Yes   Plan:  Heparin 2000 units IV bolus, then increase heparin  1900 units/hr Check heparin level in 6 hours. Coumadin 2.5 mg today  76, PharmD, BCPS

## 2020-09-07 NOTE — Consult Note (Signed)
WOC Nurse Consult Note: Patient receiving care in 3E21 Reason for Consult: LE wounds This consult was completed yesterday. Please see note and orders in the chart. Will sign off at this time.   Thank you for the consult. WOC nurse will not follow at this time.   Please re-consult the WOC team if needed.  Renaldo Reel Katrinka Blazing, MSN, RN, CMSRN, Angus Seller, [facility] Wound Treatment Associate Pager [phone]

## 2020-09-07 NOTE — Progress Notes (Signed)
Lab called to recollect CBC D/T drop in hgb, 15.9-11. Redraw done.

## 2020-09-07 NOTE — Progress Notes (Signed)
ANTICOAGULATION CONSULT NOTE - Follow Up Consult  Pharmacy Consult for Heparin and Warfarin Indication: Tissue AVR and hx TIA   Patient Measurements: Height: 5\' 1"  (154.9 cm) Weight: 124.5 kg (274 lb 7.6 oz) IBW/kg (Calculated) : 47.8 Heparin Dosing Weight: 78 kg  Vital Signs: Temp: 97.9 F (36.6 C) (12/30 0748) Temp Source: Oral (12/30 0748) BP: 100/58 (12/30 0748) Pulse Rate: 82 (12/30 0748)  Labs: Recent Labs    09/06/20 0012 09/06/20 0528 09/06/20 1318 09/07/20 0251 09/07/20 0529  HGB 13.3 15.9*  --   --  12.0  HCT 41.7 51.9*  --   --  38.9  PLT 254 215  --   --  273  LABPROT 15.0  --   --  15.5*  --   INR 1.2  --   --  1.3*  --   HEPARINUNFRC  --   --  <0.10* <0.10*  --   CREATININE 0.93 0.79  --  0.68  --   CKTOTAL 3,952* 4,082*  --  1,725*  --     Estimated Creatinine Clearance: 86.9 mL/min (by C-G formula based on SCr of 0.68 mg/dL).   Medications:  Infusions:  . sodium chloride Stopped (09/07/20 0535)  . ceFEPime (MAXIPIME) IV Stopped (09/07/20 0448)  . heparin 1,900 Units/hr (09/07/20 0604)  . vancomycin 125 mL/hr at 09/07/20 09/09/20    Assessment: 64 YOF on warfarin PTA for hx tissue AVR + TIA with a low INR on admission and started on a Heparin bridge. Pharmacy consulted for Heparin + Warfarin dosing.  Heparin level this afternoon is SUBtherapeutic despite a rate increase earlier today (HL 0.13, goal of 0.3-0.7). INR 1.3 this AM - dose already entered to be given today of 2.5. CBC wnl. No bleeding or issues noted per discussion with RN.  Goal of Therapy:  INR 2-3 Heparin level 0.3-0.7 units/ml Monitor platelets by anticoagulation protocol: Yes   Plan:  - Heparin 2000 unit bolus x 1 - Increase Heparin to 2100 units/hr (21 ml/hr) - Will continue to monitor for any signs/symptoms of bleeding and will follow up with heparin level in 6 hours   Thank you for allowing pharmacy to be a part of this patient's care.  [id number], PharmD,  BCPS Clinical Pharmacist Clinical phone for 09/07/2020: 09/09/2020 09/07/2020 9:23 AM   **Pharmacist phone directory can now be found on [email or web address] ([email or web address]).  Listed under [facility].

## 2020-09-07 NOTE — Progress Notes (Signed)
PROGRESS NOTE    [name]  TRV:[id number] DOB: Dec 25, 1955 DOA: 09/05/2020 PCP: Noni Saupe, MD   Brief Narrative:  The patient is a super morbidly obese Caucasian female with a past medical history significant for but not limited to chronic lymphedema of the bilateral lower extremities with left being greater than right, CAD, history of aortic stenosis status post AVR with bioprosthetic valve on chronic anticoagulation with Coumadin with a goal INR 2-3, history of TIA, as well as other comorbidities who presented after a fall and was found to have worsening left lower extremity erythema.  Patient states the onset was about a week ago and progressively gotten worse and was severe.  She slipped off the bed and got stuck between the bed and the wall for approximately hours.  Now complains of right knee and hip pain.  Left leg was worse with erythema and edema.  Please see the pictures and Dr. Boston Service note.  **Interim History Sepsis physiology is improved and we have changed her daptomycin to vancomycin.  She is tolerating her vancomycin just fine.  WBC had initially improved now slightly worsened.  We will repeat her procalcitonin level in the a.m.  Patient thinks that her leg looks a little bit better but I have asked the nurse to mark the skin today.  We will continue to monitor and if she is not improving will obtain imaging of her leg and ID consultation.  She will need to have a PT OT consultation as well.  Rhabdomyolysis is improving.  Assessment & Plan:   Principal Problem:   Sepsis due to cellulitis [facility]) Active Problems:   Edema of both legs   H/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   Morbid obesity (HCC)   Chronic diastolic CHF (congestive heart failure) (HCC)  Sepsis due to LLE cellulitis  -Sepsis pathway -Cefepime, placed on daptomycin in the ED but this is may change to IV vancomycin given that her allergy is just itching -MRSA PCR nares was positive -BCx  pending -PCT was 2.10; WBC was 23.8 and is improved to 16.7 but is now slightly worsened to 19.2 -Wound care consult for leg -No subq emphysema, dont think she has Magazine features editor. -I have asked the nurse to mark her skin view response to antibiotics -We will obtain PT OT evaluation  Right knee pain -Improving -Had a CT scan of her knee which showed "No acute fracture or dislocation. Edema around the medial patellar retinaculum with slight lateral subluxation of the patella. Probable small loose body in the region of the posterior medial meniscus." -Continue to monitor and will get PT to OT to evaluate  Right Hip Pain -DG X-Ray showed "Degenerative changes in the right hip with joint space narrowing and osteophyte formation on both sides of the hip. Heterotopic ossification over the greater trochanter. No evidence of acute fracture or dislocation. No focal bone lesions. Soft tissues are unremarkable."  Rhabdomyolysis -CPK only 3900 and only worsened a little bit to 4082 and is now improved to 1725 -UA pending, urine looks very dark -IVF: giving another 1L bolus now (1.25L total bolus volume) and 150 cc/hr LR has now been discontinued and she just remains on a heparin drip with Coumadin -Repeat CPK in AM -Strict intake and output; encourage oral intake  Elevated AST  -In the setting of rhabdomyolysis -Continue to monitor and trend and repeat CMP in a.m. -if necessary will obtain a right upper quadrant ultrasound and a acute hepatitis panel  Chronic dCHF  History of Aortic stenosis status post AVR bioprosthetic valve -Holding diuretics -Holding metoprolol -Coumadin per pharm consult, on coumadin chronically for stroke prevention, currently INR 1.3, goal INR 2-3 per patient.  -IV fluid hydration is now stopped -Continue monitor for signs and symptoms of volume overload  -Strict I's and O's and daily weights -Will put on heparin bridge for the moment  Hyponatremia -Mild and  improving -Na+ went from 129 -> 131 and is 133 -C/w IVF Hydration -Continue to Monitor and Trend  Super Morbid Obesity -Complicates overall prognosis and care -Estimated body mass index is 50.36 kg/m as calculated from the following:   Height as of this encounter: 5\' 1"  (1.549 m).   Weight as of this encounter: 120.9 kg. -Weight loss and dietary counseling given  Hyperglycemia -Check hemoglobin A1c in a.m. -Continue to monitor blood sugars carefully and if necessary will place on sensitive NovoLog/scale insulin -Blood sugars have been ranging from 108-119 on daily BMPs/CMP   DVT prophylaxis: Anticoagulant with a heparin drip as well as Coumadin Code Status: FULL CODE Family Communication: No family present at bedside  Disposition Plan: Pending PT OT evaluation further clinical response and improvement of her cellulitis  Status is: Inpatient  Remains inpatient appropriate because:Unsafe d/c plan, IV treatments appropriate due to intensity of illness or inability to take PO and Inpatient level of care appropriate due to severity of illness   Dispo: The patient is from: Home              Anticipated d/c is to: Home              Anticipated d/c date is: 2 days              Patient currently is not medically stable to d/c.  Consultants:   None   Procedures: None  Antimicrobials:  Anti-infectives (From admission, onward)   Start     Dose/Rate Route Frequency Ordered Stop   09/07/20 0600  vancomycin (VANCOREADY) IVPB 1250 mg/250 mL        1,250 mg 125 mL/hr over 120 Minutes Intravenous Every 12 hours 09/06/20 1617     09/06/20 1715  vancomycin (VANCOREADY) IVPB 2000 mg/400 mL        2,000 mg 133.3 mL/hr over 180 Minutes Intravenous  Once 09/06/20 1617 09/06/20 2037   09/06/20 0600  metroNIDAZOLE (FLAGYL) tablet 500 mg  Status:  Discontinued        500 mg Oral Every 8 hours 09/06/20 0343 09/06/20 0428   09/06/20 0400  ceFEPIme (MAXIPIME) 2 g in sodium chloride 0.9 % 100  mL IVPB        2 g 200 mL/hr over 30 Minutes Intravenous Every 8 hours 09/06/20 0348     09/05/20 2345  DAPTOmycin (CUBICIN) 500 mg in sodium chloride 0.9 % IVPB  Status:  Discontinued        500 mg 220 mL/hr over 30 Minutes Intravenous Daily 09/05/20 2336 09/06/20 1837   09/05/20 2330  cefTRIAXone (ROCEPHIN) 2 g in sodium chloride 0.9 % 100 mL IVPB        2 g 200 mL/hr over 30 Minutes Intravenous  Once 09/05/20 2319 09/06/20 0301        Subjective: Seen and examined at bedside and thinks that her leg is doing little bit better.  She has more motion in her right leg today than she did yesterday.  Feels overall okay.  No nausea or vomiting.  Did not sleep very  well last night.  No other concerns or plans at this time.  Objective: Vitals:   09/07/20 0021 09/07/20 0341 09/07/20 0748 09/07/20 1346  BP: 129/63 130/61 (!) 100/58 126/75  Pulse: 98 95 82 98  Resp: 19 18 20 18   Temp: 98.3 F (36.8 C) 98.9 F (37.2 C) 97.9 F (36.6 C)   TempSrc: Oral Oral Oral   SpO2: 90% 95% 95% 97%  Weight:  124.5 kg    Height:        Intake/Output Summary (Last 24 hours) at 09/07/2020 1740 Last data filed at 09/07/2020 1524 Gross per 24 hour  Intake 3305.76 ml  Output 700 ml  Net 2605.76 ml   Filed Weights   09/06/20 0134 09/06/20 1532 09/07/20 0341  Weight: 122.9 kg 120.9 kg 124.5 kg   Examination: Physical Exam:  Constitutional: WN/WD super morbidly obese pleasant Caucasian female currently no acute distress appears calm and comfortable Eyes: Lids and conjunctivae normal, sclerae anicteric  ENMT: External Ears, Nose appear normal. Grossly normal hearing.  Neck: Appears normal, supple, no cervical masses, normal ROM, no appreciable thyromegaly: No JVD Respiratory: Diminished to auscultation bilaterally, no wheezing, rales, rhonchi or crackles. Normal respiratory effort and patient is not tachypenic. No accessory muscle use.  Unlabored breathing Cardiovascular: RRR, no murmurs / rubs /  gallops. S1 and S2 auscultated.  Has 1+ lower extremity edema and left leg is wrapped in the lower extremities with her wounds are.  Thigh area and medial upper thigh extremely erythematous and warm from her cellulitis Abdomen: Soft, non-tender, distended secondary body. Bowel sounds positive.  GU: Deferred. Musculoskeletal: No clubbing / cyanosis of digits/nails. No joint deformity upper and lower extremities.  Skin: No rashes, lesions, ulcers on limited skin evaluation. No induration; Warm and dry.  Neurologic: CN 2-12 grossly intact with no focal deficits.  Romberg sign and cerebellar reflexes not assessed.  Psychiatric: Normal judgment and insight. Alert and oriented x 3. Normal mood and appropriate affect.   Data Reviewed: I have personally reviewed following labs and imaging studies  CBC: Recent Labs  Lab 09/06/20 0012 09/06/20 0528 09/07/20 0529  WBC 23.8* 16.7* 19.2*  NEUTROABS 20.3*  --  14.1*  HGB 13.3 15.9* 12.0  HCT 41.7 51.9* 38.9  MCV 85.1 85.6 86.6  PLT 254 215 790   Basic Metabolic Panel: Recent Labs  Lab 09/06/20 0012 09/06/20 0528 09/07/20 0251  NA 129* 131* 133*  K 3.9 3.8 3.7  CL 95* 98 103  CO2 25 22 22   GLUCOSE 122* 108* 119*  BUN 18 15 9   CREATININE 0.93 0.79 0.68  CALCIUM 9.5 9.2 8.8*  MG  --   --  2.1  PHOS  --   --  3.1   GFR: Estimated Creatinine Clearance: 86.9 mL/min (by C-G formula based on SCr of 0.68 mg/dL). Liver Function Tests: Recent Labs  Lab 09/06/20 0012 09/07/20 0251  AST 83* 85*  ALT 34 41  ALKPHOS 59 58  BILITOT 0.9 0.4  PROT 7.3 6.2*  ALBUMIN 2.2* 1.7*   No results for input(s): LIPASE, AMYLASE in the last 168 hours. No results for input(s): AMMONIA in the last 168 hours. Coagulation Profile: Recent Labs  Lab 09/06/20 0012 09/07/20 0251  INR 1.2 1.3*   Cardiac Enzymes: Recent Labs  Lab 09/06/20 0012 09/06/20 0528 09/07/20 0251  CKTOTAL 3,952* 4,082* 1,725*   BNP (last 3 results) Recent Labs     06/06/20 0903  PROBNP 1,079*   HbA1C: No results for input(s):  HGBA1C in the last 72 hours. CBG: No results for input(s): GLUCAP in the last 168 hours. Lipid Profile: No results for input(s): CHOL, HDL, LDLCALC, TRIG, CHOLHDL, LDLDIRECT in the last 72 hours. Thyroid Function Tests: No results for input(s): TSH, T4TOTAL, FREET4, T3FREE, THYROIDAB in the last 72 hours. Anemia Panel: No results for input(s): VITAMINB12, FOLATE, FERRITIN, TIBC, IRON, RETICCTPCT in the last 72 hours. Sepsis Labs: Recent Labs  Lab 09/06/20 0012 09/06/20 0214 09/06/20 0528  PROCALCITON  --   --  2.10  LATICACIDVEN 1.7 1.6  --     Recent Results (from the past 240 hour(s))  Blood culture (routine x 2)     Status: None (Preliminary result)   Collection Time: 09/06/20 12:12 AM   Specimen: BLOOD  Result Value Ref Range Status   Specimen Description BLOOD RIGHT ANTECUBITAL  Final   Special Requests   Final    BOTTLES DRAWN AEROBIC AND ANAEROBIC Blood Culture adequate volume   Culture   Final    NO GROWTH 1 DAY Performed at [facility], [address] [place], [place] [address]    Report Status PENDING  Incomplete  Blood culture (routine x 2)     Status: None (Preliminary result)   Collection Time: 09/06/20  2:14 AM   Specimen: BLOOD  Result Value Ref Range Status   Specimen Description BLOOD LEFT ANTECUBITAL  Final   Special Requests   Final    BOTTLES DRAWN AEROBIC AND ANAEROBIC Blood Culture results may not be optimal due to an excessive volume of blood received in culture bottles   Culture   Final    NO GROWTH 1 DAY Performed at [facility], [address] [place], [place] [address]    Report Status PENDING  Incomplete  SARS CORONAVIRUS 2 (TAT 6-24 HRS) Nasopharyngeal Nasopharyngeal Swab     Status: None   Collection Time: 09/06/20  2:14 AM   Specimen: Nasopharyngeal Swab  Result Value Ref Range Status   SARS Coronavirus 2 NEGATIVE NEGATIVE Final    Comment:  (NOTE) SARS-CoV-2 target nucleic acids are NOT DETECTED.  The SARS-CoV-2 RNA is generally detectable in upper and lower respiratory specimens during the acute phase of infection. Negative results do not preclude SARS-CoV-2 infection, do not rule out co-infections with other pathogens, and should not be used as the sole basis for treatment or other patient management decisions. Negative results must be combined with clinical observations, patient history, and epidemiological information. The expected result is Negative.  Fact Sheet for Patients: HairSlick.no  Fact Sheet for Healthcare Providers: quierodirigir.com  This test is not yet approved or cleared by the [place] FDA and  has been authorized for detection and/or diagnosis of SARS-CoV-2 by FDA under an Emergency Use Authorization (EUA). This EUA will remain  in effect (meaning this test can be used) for the duration of the COVID-19 declaration under Se ction 564(b)(1) of the Act, 21 U.S.C. section 360bbb-3(b)(1), unless the authorization is terminated or revoked sooner.  Performed at [facility], [address] [place], [place] [address]   MRSA PCR Screening     Status: Abnormal   Collection Time: 09/07/20 12:38 AM   Specimen: Nasal Mucosa; Nasopharyngeal  Result Value Ref Range Status   MRSA by PCR POSITIVE (A) NEGATIVE Final    Comment:        The GeneXpert MRSA Assay (FDA approved for NASAL specimens only), is one component of a comprehensive MRSA colonization surveillance program.  It is not intended to diagnose MRSA infection nor to guide or monitor treatment for MRSA infections. RESULT CALLED TO, READ BACK BY AND VERIFIED WITH: Alicia Amel RN 09/07/20 2620 JDW Performed at [facility], [address], [place], [place] [address]      RN Pressure Injury Documentation:     Estimated body mass index is 51.86 kg/m as calculated from the  following:   Height as of this encounter: 5\' 1"  (1.549 m).   Weight as of this encounter: 124.5 kg.  Malnutrition Type:   Malnutrition Characteristics:   Nutrition Interventions:   Radiology Studies: DG Knee 2 Views Right  Result Date: 09/05/2020 CLINICAL DATA:  Substance fell, right knee pain EXAM: RIGHT KNEE - 1-2 VIEW COMPARISON:  None. FINDINGS: Frontal and cross-table lateral views of the right knee demonstrate moderate medial compartmental joint space narrowing. There is spurring of the medial and patellofemoral compartments. No fracture, subluxation, or dislocation. No joint effusion. IMPRESSION: 1. Medial and patellofemoral compartmental osteoarthritis. No acute fracture. Electronically Signed   By: 09/07/2020 M.D.   On: 09/05/2020 23:13   DG Tibia/Fibula Left  Result Date: 09/05/2020 CLINICAL DATA:  Left leg swelling EXAM: LEFT TIBIA AND FIBULA - 2 VIEW COMPARISON:  None. FINDINGS: There is no evidence of fracture or other focal bone lesions. Diffuse subcutaneous edema seen surrounding lower extremity. IMPRESSION: No acute osseous abnormality. Diffuse subcu edema seen surrounding the lower extremity. Electronically Signed   By: 09/07/2020 M.D.   On: 09/05/2020 23:15   CT Knee Right Wo Contrast  Result Date: 09/06/2020 CLINICAL DATA:  Fall, injury EXAM: CT OF THE right KNEE WITHOUT CONTRAST TECHNIQUE: Multidetector CT imaging of the right knee was performed according to the standard protocol. Multiplanar CT image reconstructions were also generated. COMPARISON:  None. FINDINGS: Bones/Joint/Cartilage No fracture or dislocation. No large knee joint effusion is seen. There is a well corticated ossicle seen within the region of the posterior medial meniscus which could represent a small loose body. Ligaments Suboptimally assessed by CT. Muscles and Tendons Enthesophytes seen at the quadriceps insertion site. The patellar tendon is intact. The muscles are normal appearance without  focal atrophy or tear. There is edema around the medial patellar retinaculum with slight lateral subluxation of the patella. Soft tissues Prepatellar subcutaneous edema is noted. No focal soft tissue mass is seen. IMPRESSION: No acute fracture or dislocation. Edema around the medial patellar retinaculum with slight lateral subluxation of the patella. Probable small loose body in the region of the posterior medial meniscus. Electronically Signed   By: 10-05-1971 M.D.   On: 09/06/2020 00:44   DG HIP UNILAT WITH PELVIS 2-3 VIEWS RIGHT  Result Date: 09/06/2020 CLINICAL DATA:  Right hip pain after a fall 3 days ago. EXAM: DG HIP (WITH OR WITHOUT PELVIS) 2-3V RIGHT COMPARISON:  None. FINDINGS: Degenerative changes in the right hip with joint space narrowing and osteophyte formation on both sides of the hip. Heterotopic ossification over the greater trochanter. No evidence of acute fracture or dislocation. No focal bone lesions. Soft tissues are unremarkable. IMPRESSION: Degenerative changes in the right hip. Electronically Signed   By: 09/08/2020 M.D.   On: 09/06/2020 19:05   DG Femur Min 2 Views Left  Result Date: 09/05/2020 CLINICAL DATA:  Slipped and fell, left leg swelling EXAM: LEFT FEMUR 2 VIEWS COMPARISON:  None. FINDINGS: Frontal and lateral views of the left femur are obtained. No acute fractures. Alignment of the left hip and  knee is anatomic. There is mild osteoarthritis of the left hip and medial compartment of the left knee. Visualized portions of the left hemipelvis are unremarkable. IMPRESSION: 1. No acute displaced fracture. 2. 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 Electronically Signed   By: Sharlet SalinaMichael  Brown M.D.   On: 09/05/2020 23:13   Scheduled Meds: . Chlorhexidine Gluconate Cloth  6 each Topical Q0600  . mupirocin ointment  1 application Nasal BID  . Warfarin - Pharmacist Dosing Inpatient   Does not apply q1600   Continuous Infusions: . sodium chloride Stopped (09/07/20  0535)  . ceFEPime (MAXIPIME) IV 2 g (09/07/20 1126)  . heparin 2,100 Units/hr (09/07/20 1517)  . vancomycin 125 mL/hr at 09/07/20 0604    LOS: 1 day    Merlene Laughtermair Latif Taleisha Kaczynski, DO [facility] PAGER is on AMION  If 7PM-7AM, please contact night-coverage [email or web address]

## 2020-09-07 NOTE — Progress Notes (Signed)
ANTICOAGULATION CONSULT NOTE - Follow Up Consult  Pharmacy Consult for Heparin and Warfarin Indication: Tissue AVR and hx TIA   Patient Measurements: Height: 5\' 1"  (154.9 cm) Weight: 124.5 kg (274 lb 7.6 oz) IBW/kg (Calculated) : 47.8 Heparin Dosing Weight: 78 kg  Vital Signs: Temp: 98.3 F (36.8 C) (12/30 2017) Temp Source: Oral (12/30 2017) BP: 135/76 (12/30 2017) Pulse Rate: 88 (12/30 2017)  Labs: Recent Labs    09/06/20 0012 09/06/20 0528 09/06/20 1318 09/07/20 0251 09/07/20 0529 09/07/20 1215 09/07/20 2038  HGB 13.3 15.9*  --   --  12.0  --   --   HCT 41.7 51.9*  --   --  38.9  --   --   PLT 254 215  --   --  273  --   --   LABPROT 15.0  --   --  15.5*  --   --   --   INR 1.2  --   --  1.3*  --   --   --   HEPARINUNFRC  --   --    < > <0.10*  --  0.13* 0.20*  CREATININE 0.93 0.79  --  0.68  --   --   --   CKTOTAL 3,952* 4,082*  --  1,725*  --   --   --    < > = values in this interval not displayed.    Estimated Creatinine Clearance: 86.9 mL/min (by C-G formula based on SCr of 0.68 mg/dL).   Medications:  Infusions:   sodium chloride Stopped (09/07/20 0535)   ceFEPime (MAXIPIME) IV 2 g (09/07/20 2051)   heparin 2,100 Units/hr (09/07/20 1517)   vancomycin 1,250 mg (09/07/20 1836)    Assessment: 64 YOF on warfarin PTA for hx tissue AVR + TIA with a low INR on admission and started on a Heparin bridge. Pharmacy consulted for Heparin + Warfarin dosing.  Heparin level remains subtherapeutic but is trending up, now 0.2.  Goal of Therapy:  INR 2-3 Heparin level 0.3-0.7 units/ml Monitor platelets by anticoagulation protocol: Yes   Plan:  -Heparin 2000 unit bolus -Increase heparin infusion to 2300 units/h -Recheck heparin level in 8h   09/09/20, PharmD, [place] Clinical Pharmacist [phone] Please check AMION for all [facility] Pharmacy numbers 09/07/2020

## 2020-09-08 DIAGNOSIS — I5032 Chronic diastolic (congestive) heart failure: Secondary | ICD-10-CM | POA: Diagnosis not present

## 2020-09-08 DIAGNOSIS — E871 Hypo-osmolality and hyponatremia: Secondary | ICD-10-CM

## 2020-09-08 DIAGNOSIS — A419 Sepsis, unspecified organism: Secondary | ICD-10-CM | POA: Diagnosis not present

## 2020-09-08 DIAGNOSIS — R600 Localized edema: Secondary | ICD-10-CM | POA: Diagnosis not present

## 2020-09-08 DIAGNOSIS — L0390 Cellulitis, unspecified: Secondary | ICD-10-CM | POA: Diagnosis not present

## 2020-09-08 LAB — COMPREHENSIVE METABOLIC PANEL
ALT: 43 U/L (ref 0–44)
AST: 66 U/L — ABNORMAL HIGH (ref 15–41)
Albumin: 1.9 g/dL — ABNORMAL LOW (ref 3.5–5.0)
Alkaline Phosphatase: 64 U/L (ref 38–126)
Anion gap: 9 (ref 5–15)
BUN: 7 mg/dL — ABNORMAL LOW (ref 8–23)
CO2: 23 mmol/L (ref 22–32)
Calcium: 8.9 mg/dL (ref 8.9–10.3)
Chloride: 103 mmol/L (ref 98–111)
Creatinine, Ser: 0.64 mg/dL (ref 0.44–1.00)
GFR, Estimated: >60 mL/min (ref >60)
Glucose, Bld: 87 mg/dL (ref 70–99)
Potassium: 3.9 mmol/L (ref 3.5–5.1)
Sodium: 135 mmol/L (ref 135–145)
Total Bilirubin: 0.6 mg/dL (ref 0.3–1.2)
Total Protein: 6.5 g/dL (ref 6.5–8.1)

## 2020-09-08 LAB — CBC WITH DIFFERENTIAL/PLATELET
Abs Immature Granulocytes: 1.42 10*3/uL — ABNORMAL HIGH (ref 0.00–0.07)
Basophils Absolute: 0.1 10*3/uL (ref 0.0–0.1)
Basophils Relative: 1 %
Eosinophils Absolute: 1 10*3/uL — ABNORMAL HIGH (ref 0.0–0.5)
Eosinophils Relative: 5 %
HCT: 37.9 % (ref 36.0–46.0)
Hemoglobin: 11.4 g/dL — ABNORMAL LOW (ref 12.0–15.0)
Immature Granulocytes: 8 %
Lymphocytes Relative: 16 %
Lymphs Abs: 3 10*3/uL (ref 0.7–4.0)
MCH: 26.5 pg (ref 26.0–34.0)
MCHC: 30.1 g/dL (ref 30.0–36.0)
MCV: 88.1 fL (ref 80.0–100.0)
Monocytes Absolute: 0.6 10*3/uL (ref 0.1–1.0)
Monocytes Relative: 3 %
Neutro Abs: 12.8 10*3/uL — ABNORMAL HIGH (ref 1.7–7.7)
Neutrophils Relative %: 67 %
Platelets: 303 10*3/uL (ref 150–400)
RBC: 4.3 MIL/uL (ref 3.87–5.11)
RDW: 16.6 % — ABNORMAL HIGH (ref 11.5–15.5)
WBC: 18.9 10*3/uL — ABNORMAL HIGH (ref 4.0–10.5)
nRBC: 0 % (ref 0.0–0.2)

## 2020-09-08 LAB — CK: Total CK: 850 U/L — ABNORMAL HIGH (ref 38–234)

## 2020-09-08 LAB — PROTIME-INR
INR: 1.6 — ABNORMAL HIGH (ref 0.8–1.2)
Prothrombin Time: 18.4 seconds — ABNORMAL HIGH (ref 11.4–15.2)

## 2020-09-08 LAB — HEPARIN LEVEL (UNFRACTIONATED)
Heparin Unfractionated: 0.24 IU/mL — ABNORMAL LOW (ref 0.30–0.70)
Heparin Unfractionated: 0.38 IU/mL (ref 0.30–0.70)
Heparin Unfractionated: 0.45 IU/mL (ref 0.30–0.70)

## 2020-09-08 LAB — MAGNESIUM: Magnesium: 2.2 mg/dL (ref 1.7–2.4)

## 2020-09-08 LAB — PHOSPHORUS: Phosphorus: 3.4 mg/dL (ref 2.5–4.6)

## 2020-09-08 MED ORDER — WARFARIN SODIUM 2.5 MG PO TABS
2.5000 mg | ORAL_TABLET | Freq: Once | ORAL | Status: AC
  Administered 2020-09-08: 2.5 mg via ORAL
  Filled 2020-09-08: qty 1

## 2020-09-08 MED ORDER — HYDROXYZINE HCL 25 MG PO TABS
50.0000 mg | ORAL_TABLET | Freq: Three times a day (TID) | ORAL | Status: DC | PRN
  Filled 2020-09-08: qty 2

## 2020-09-08 MED ORDER — SODIUM CHLORIDE 0.9 % IV SOLN
2.0000 g | INTRAVENOUS | Status: DC
  Administered 2020-09-08: 2 g via INTRAVENOUS
  Filled 2020-09-08: qty 20

## 2020-09-08 NOTE — [place] FL2 (Signed)
[place] MEDICAID FL2 LEVEL OF CARE SCREENING TOOL     IDENTIFICATION  Patient Name: [name] Birthdate: [birth year] Sex: female Admission Date (Current Location): 09/05/2020  [facility] and [place] Number:  Producer, television/film/video and Address:  [facility] [place]. [facility], [address], [place], [place] [address]      Provider Number: [id number]  Attending Physician Name and Address:  Merlene Laughter, DO  Relative Name and Phone Number:       Current Level of Care: Hospital Recommended Level of Care: Skilled Nursing Facility Prior Approval Number: Samayra, Hebel)   [phone] [facility])  Date Approved/Denied:   PASRR Number: [id number]  Discharge Plan: SNF    Current Diagnoses: Patient Active Problem List   Diagnosis Date Noted  . Sepsis due to cellulitis (HCC) 09/06/2020  . Heart failure (HCC)   . Bicuspid aortic valve   . Iron deficiency anemia due to chronic blood loss 10/19/2019  . Insomnia 10/07/2019  . Encounter for insertion of mirena IUD 08/17/2019  . Long term (current) use of anticoagulants 06/11/2019  . TIA (transient ischemic attack) 06/04/2019  . Transient neurologic deficit 06/03/2019  . Anemia 06/03/2019  . Chronic diastolic CHF (congestive heart failure) (HCC) 06/03/2019  . CAD (coronary artery disease) 06/03/2019  . Essential hypertension 06/03/2019  . Avulsed toenail, initial encounter 05/27/2019  . Infection of toenail 05/27/2019  . Sleep apnea 09/25/2018  . Obstructive hypertrophic cardiomyopathy (HCC) 07/10/2018  . Dysfunctional uterine bleeding 04/22/2018  . Postmenopausal bleeding 04/16/2018  . Edema of both legs 02/20/2017  . 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 Morbid obesity (HCC) 02/20/2017  . Aortic stenosis 03/23/2013  . Hypertensive heart disease with heart failure (HCC) 03/23/2013  . HTN (hypertension) 03/23/2013    Orientation RESPIRATION BLADDER Height &  Weight     Self,Time,Situation,Place  Other (Comment) (BIPAP only when sleeping) Incontinent Weight: 278 lb 7.1 oz (126.3 kg) Height:  5\' 1"  (154.9 cm)  BEHAVIORAL SYMPTOMS/MOOD NEUROLOGICAL BOWEL NUTRITION STATUS      Continent Diet (see d/c summary)  AMBULATORY STATUS COMMUNICATION OF NEEDS Skin   Extensive Assist Verbally Other (Comment) (Venus Stasis Ulcer)                       Personal Care Assistance Level of Assistance  Bathing,Feeding,Dressing Bathing Assistance: Limited assistance Feeding assistance: Independent Dressing Assistance: Limited assistance     Functional Limitations Info  Sight,Hearing,Speech Sight Info: Adequate Hearing Info: Adequate Speech Info: Adequate    SPECIAL CARE FACTORS FREQUENCY  OT (By licensed OT),PT (By licensed PT)     PT Frequency: 5x/week OT Frequency: 5x/week            Contractures Contractures Info: Not present    Additional Factors Info  Code Status,Allergies Code Status Info: Full COde Allergies Info: Vancomycin           Current Medications (09/08/2020):  This is the current hospital active medication list Current Facility-Administered Medications  Medication Dose Route Frequency Provider Last Rate Last Admin  . 0.9 %  sodium chloride infusion   Intravenous PRN 09/10/2020 [place], DO   Stopped at 09/07/20 09/09/20  . acetaminophen (TYLENOL) tablet 650 mg  650 mg Oral Q6H PRN [id number], MD   650 mg at 09/07/20 0417  . cefTRIAXone (ROCEPHIN) 2 g in sodium chloride 0.9 % 100 mL IVPB  2 g Intravenous Q24H Sheikh, Omair [place], DO      .  Chlorhexidine Gluconate Cloth 2 % PADS 6 each  6 each Topical Q0600 Raiford Noble [place], [place]   6 each at 09/07/20 1814  . diphenhydrAMINE (BENADRYL) capsule 25 mg  25 mg Oral Q12H PRN Raiford Noble [place], DO   25 mg at 09/08/20 [id number]  . diphenhydrAMINE-zinc acetate (BENADRYL) 2-0.1 % cream   Topical BID PRN Opyd, Ilene Qua, MD   Given at 09/07/20 2317  . heparin ADULT infusion 100  units/mL (25000 units/283mL)  2,500 Units/hr Intravenous Continuous Raiford Noble [place], DO 25 mL/hr at 09/08/20 1204 2,500 Units/hr at 09/08/20 1204  . hydrOXYzine (ATARAX/VISTARIL) tablet 50 mg  50 mg Oral TID PRN Raiford Noble [place], DO      . mupirocin ointment (BACTROBAN) 2 % 1 application  1 application Nasal BID Raiford Noble [place], [place]   1 application at 0000000 [id number]  . vancomycin (VANCOREADY) IVPB 1250 mg/250 mL  1,250 mg Intravenous Q12H [place] Jude, [facility] 125 mL/hr at 09/08/20 0604 1,250 mg at 09/08/20 0604  . warfarin (COUMADIN) tablet 2.5 mg  2.5 mg Oral ONCE-1600 Sheikh, Omair [place], [place]      . Warfarin - Pharmacist Dosing Inpatient   Does not apply q1600 Laren Everts, [facility]   Given at 09/07/20 1524     Discharge Medications: Please see discharge summary for a list of discharge medications.  Relevant Imaging Results:  Relevant Lab Results:   Additional Information SSN [place], [place]

## 2020-09-08 NOTE — Progress Notes (Signed)
ANTICOAGULATION CONSULT NOTE Pharmacy Consult for heparin and warfarin Indication: h/o TIA  Allergies  Allergen Reactions  . Vancomycin Itching    Patient can receive vancomycin with slower infusion and prn benadryl for itching     Patient Measurements: Height: 5\' 1"  (154.9 cm) Weight: 126.3 kg (278 lb 7.1 oz) IBW/kg (Calculated) : 47.8 Heparin Dosing Weight: 78kg  Vital Signs: Temp: 97.4 F (36.3 C) (12/31 0404) Temp Source: Oral (12/31 0404) BP: 130/73 (12/31 0404) Pulse Rate: 75 (12/31 0404)  Labs: Recent Labs    09/06/20 0012 09/06/20 0528 09/06/20 1318 09/07/20 0251 09/07/20 0529 09/07/20 1215 09/07/20 2038 09/08/20 0315  HGB 13.3 15.9*  --   --  12.0  --   --  11.4*  HCT 41.7 51.9*  --   --  38.9  --   --  37.9  PLT 254 215  --   --  273  --   --  303  LABPROT 15.0  --   --  15.5*  --   --   --  18.4*  INR 1.2  --   --  1.3*  --   --   --  1.6*  HEPARINUNFRC  --   --    < > <0.10*  --  0.13* 0.20* 0.24*  CREATININE 0.93 0.79  --  0.68  --   --   --  0.64  CKTOTAL 3,952* 4,082*  --  1,725*  --   --   --   --    < > = values in this interval not displayed.    Estimated Creatinine Clearance: 87.7 mL/min (by C-G formula based on SCr of 0.64 mg/dL).  Assessment: 64 y.o. female with h/o cardioembolic TIA for anticoagulation.  Heparin level and INR subtherapeutic  Goal of Therapy:  INR 2-3 Heparin level 0.3-0.7 units/ml Monitor platelets by anticoagulation protocol: Yes   Plan:  Increase heparin  2500 units/hr Coumadin 2.5 mg today  76, PharmD, BCPS

## 2020-09-08 NOTE — Progress Notes (Signed)
PROGRESS NOTE    [name]  [id number] DOB: 05-14-1956 DOA: 09/05/2020 PCP: [name], MD   Brief Narrative:  The patient is a super morbidly obese Caucasian female with a past medical history significant for but not limited to chronic lymphedema of the bilateral lower extremities with left being greater than right, CAD, history of aortic stenosis status post AVR with bioprosthetic valve on chronic anticoagulation with Coumadin with a goal INR 2-3, history of TIA, as well as other comorbidities who presented after a fall and was found to have worsening left lower extremity erythema.  Patient states the onset was about a week ago and progressively gotten worse and was severe.  She slipped off the bed and got stuck between the bed and the wall for approximately hours.  Now complains of right knee and hip pain.  Left leg was worse with erythema and edema.  Please see the pictures and Dr. Juleen China note.  **Interim History Sepsis physiology is improved and we have changed her daptomycin to vancomycin.  She is tolerating her vancomycin just fine.  WBC had initially improved now slightly worsened.  We will repeat her procalcitonin level in the a.m.  Patient thinks that her leg looks a little bit better but I have asked the nurse to mark the skin today.  We will continue to monitor and if she is not improving will obtain imaging of her leg and ID consultation.  She will need to have a PT OT consultation as well.  Rhabdomyolysis is improving.  INR still remains subtherapeutic and she remains on a heparin bridge.  Will need to continue to monitor for signs and symptoms of bleeding.  Repeat CK this morning is 850.  WBC is slightly improved.  We will continue IV vancomycin but will deescalate IV cefepime to IV Ceftriaxone and may discuss with infectious diseases for further recommendations on transitioning.  Assessment & Plan:   Principal Problem:   Sepsis due to cellulitis [facility]) Active  Problems:   Edema of both legs   H/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   Morbid obesity (HCC)   Chronic diastolic CHF (congestive heart failure) (HCC)  Sepsis due to LLE cellulitis  -Sepsis was present on admission; she was documented to have a tachypnea with a respiratory rate of 22, tachycardia with a pulse rate of 107, and a leukocytosis of 23.8 -Was continued on IV Cefepime but will de-escalate this to IV Ceftriaxone now, placed on daptomycin in the ED but this is may change to IV vancomycin given that her allergy is just itching -Patient had some minor itching today so will start Hydroxyzine 25 mg po TIDprn -MRSA PCR nares was positive -BCx pending -PCT was 2.10; WBC was 23.8 and is improved to 16.7 but is now slightly worsened to 19.2 but today it is 18.9 -Wound care consult for leg -No subq emphysema, dont think she has Geneticist, molecular. -I have asked the nurse to mark her skin view response to antibiotics -We will obtain PT OT evaluation and as of this AM is still pending   Right knee pain -Improving -Had a CT scan of her knee which showed "No acute fracture or dislocation. Edema around the medial patellar retinaculum with slight lateral subluxation of the patella. Probable small loose body in the region of the posterior medial meniscus." -Continue to monitor and will get PT to OT to evaluate  Right Hip Pain -DG X-Ray showed "Degenerative changes in the right hip with joint  space narrowing and osteophyte formation on both sides of the hip. Heterotopic ossification over the greater trochanter. No evidence of acute fracture or dislocation. No focal bone lesions. Soft tissues are unremarkable."  Rhabdomyolysis -CPK only 3900 and only worsened a little bit to 4082 and is now improved to 1725 yesterday and today's CK level still is 850 -UA pending, urine looks very dark -IVF: giving another 1L bolus now (1.25L total bolus volume) and 150 cc/hr LR has now been discontinued and she  just remains on a heparin drip with Coumadin -Repeat CPK in AM -Strict intake and output; encourage oral intake  Elevated AST  -In the setting of rhabdomyolysis and is improving.  AST is now 66 down from 85  -Continue to monitor and trend and repeat CMP in a.m. -if necessary will obtain a right upper quadrant ultrasound and a acute hepatitis panel  Chronic dCHF  History of Aortic stenosis status post AVR bioprosthetic valve -Holding diuretics -Holding metoprolol -Coumadin per pharm consult, on coumadin chronically for stroke prevention, currently INR 1.6, goal INR 2-3 per patient.   We will continue to bridge the patient with heparin drip and Coumadin until her INR is therapeutic -IV fluid hydration is now stopped; She is +1.895 Liters -Continue monitor for signs and symptoms of volume overload  -Strict I's and O's and daily weights -Will put on heparin bridge for the moment  Hyponatremia -Mild and improving -Na+ went from 129 -> 131 and is 133 -> 135 -C/w IVF Hydration -Continue to Monitor and Trend  Super Morbid Obesity -Complicates overall prognosis and care -Estimated body mass index is 52.61 kg/m as calculated from the following:   Height as of this encounter: 5\' 1"  (1.549 m).   Weight as of this encounter: 126.3 kg. -Weight loss and dietary counseling given  Hyperglycemia in setting of Diabetes mellitus type 2 -Check hemoglobin A1c in a.m; last hemoglobin A1c was 7.0 a year ago -Continue to monitor blood sugars carefully and if necessary will place on sensitive NovoLog/scale insulin -Blood sugars have been ranging from 87-119 on daily BMPs/CMP  Normocytic Anemia -Patient's hemoglobin/hematocrit went from 50.9/51.9 -> 12.0/38.9 -> 11.4/37.9 -Check anemia panel in the a.m. -Continue to monitor for signs and symptoms of bleeding as she is anticoagulated on a heparin drip as well as Coumadin; currently no overt bleeding noted -Repeat CBC in the a.m.   DVT prophylaxis:  Anticoagulated with a heparin drip as well as Coumadin given Subtherapeutic INR Code Status: FULL CODE Family Communication: No family present at bedside  Disposition Plan: Pending PT OT evaluation further clinical response and improvement of her cellulitis as well as having a therapeutic INR prior to discharge  Status is: Inpatient  Remains inpatient appropriate because:Unsafe d/c plan, IV treatments appropriate due to intensity of illness or inability to take PO and Inpatient level of care appropriate due to severity of illness   Dispo: The patient is from: Home              Anticipated d/c is to: Home              Anticipated d/c date is: 2 days              Patient currently is not medically stable to d/c.  Consultants:   None   Procedures: None  Antimicrobials:  Anti-infectives (From admission, onward)   Start     Dose/Rate Route Frequency Ordered Stop   09/07/20 0600  vancomycin (VANCOREADY) IVPB 1250 mg/250 mL  1,250 mg 125 mL/hr over 120 Minutes Intravenous Every 12 hours 09/06/20 1617     09/06/20 1715  vancomycin (VANCOREADY) IVPB 2000 mg/400 mL        2,000 mg 133.3 mL/hr over 180 Minutes Intravenous  Once 09/06/20 1617 09/06/20 2037   09/06/20 0600  metroNIDAZOLE (FLAGYL) tablet 500 mg  Status:  Discontinued        500 mg Oral Every 8 hours 09/06/20 0343 09/06/20 0428   09/06/20 0400  ceFEPIme (MAXIPIME) 2 g in sodium chloride 0.9 % 100 mL IVPB        2 g 200 mL/hr over 30 Minutes Intravenous Every 8 hours 09/06/20 0348     09/05/20 2345  DAPTOmycin (CUBICIN) 500 mg in sodium chloride 0.9 % IVPB  Status:  Discontinued        500 mg 220 mL/hr over 30 Minutes Intravenous Daily 09/05/20 2336 09/06/20 1837   09/05/20 2330  cefTRIAXone (ROCEPHIN) 2 g in sodium chloride 0.9 % 100 mL IVPB        2 g 200 mL/hr over 30 Minutes Intravenous  Once 09/05/20 2319 09/06/20 0301        Subjective: Seen and examined at bedside thinks she is doing a bit better leg.  She  feels that her leg is doing better and she has more motion in her right leg.  Was complaining of some itching but this was minor.  No chest pain, shortness of breath nausea or vomiting.  No other concerns or complaints at this time.  Objective: Vitals:   09/07/20 2017 09/08/20 0037 09/08/20 0404 09/08/20 0725  BP: 135/76 (!) 144/72 130/73 134/71  Pulse: 88 83 75 72  Resp: 20 19 18 20   Temp: 98.3 F (36.8 C) 98.3 F (36.8 C) (!) 97.4 F (36.3 C)   TempSrc: Oral Oral Oral   SpO2: 96% 97% 99% 91%  Weight:   126.3 kg   Height:        Intake/Output Summary (Last 24 hours) at 09/08/2020 0802 Last data filed at 09/08/2020 [id number] Gross per 24 hour  Intake 1046.4 ml  Output 750 ml  Net 296.4 ml   Filed Weights   09/06/20 1532 09/07/20 0341 09/08/20 0404  Weight: 120.9 kg 124.5 kg 126.3 kg   Examination: Physical Exam:  Constitutional: WN/WD super morbidly obese pleasant Caucasian female currently no acute distress appears calm and comfortable Eyes: Lids and conjunctivae normal, sclerae anicteric  ENMT: External Ears, Nose appear normal. Grossly normal hearing.  Neck: Appears normal, supple, no cervical masses, normal ROM, no appreciable thyromegaly; no appreciable JVD but this is a difficult to assess given her body habitus Respiratory: Diminished to auscultation bilaterally, no wheezing, rales, rhonchi or crackles. Normal respiratory effort and patient is not tachypenic. No accessory muscle use.  Unlabored breathing Cardiovascular: RRR, no murmurs / rubs / gallops. S1 and S2 auscultated.  Has mild lower extremity edema left leg lower extremity is wrapped given her wounds in her left upper extremity thigh and medial area is less erythematous and not as warm and has some clearing. Abdomen: Soft, non-tender, distended secondary habitus. Bowel sounds positive.  GU: Deferred. Musculoskeletal: No clubbing / cyanosis of digits/nails. No joint deformity upper and lower extremities.  Skin: No  rashes, but has lower extremity skin lesions on the left leg and left proximal lower extremity is erythematous and slightly warm to touch. No induration; Warm and dry.  Neurologic: CN 2-12 grossly intact with no focal deficits. Romberg sign and cerebellar  reflexes not assessed.  Psychiatric: Normal judgment and insight. Alert and oriented x 3. Normal mood and appropriate affect.   Data Reviewed: I have personally reviewed following labs and imaging studies  CBC: Recent Labs  Lab 09/06/20 0012 09/06/20 0528 09/07/20 0529 09/08/20 0315  WBC 23.8* 16.7* 19.2* 18.9*  NEUTROABS 20.3*  --  14.1* 12.8*  HGB 13.3 15.9* 12.0 11.4*  HCT 41.7 51.9* 38.9 37.9  MCV 85.1 85.6 86.6 88.1  PLT 254 215 273 303   Basic Metabolic Panel: Recent Labs  Lab 09/06/20 0012 09/06/20 0528 09/07/20 0251 09/08/20 0315  NA 129* 131* 133* 135  K 3.9 3.8 3.7 3.9  CL 95* 98 103 103  CO2 25 22 22 23   GLUCOSE 122* 108* 119* 87  BUN 18 15 9  7*  CREATININE 0.93 0.79 0.68 0.64  CALCIUM 9.5 9.2 8.8* 8.9  MG  --   --  2.1 2.2  PHOS  --   --  3.1 3.4   GFR: Estimated Creatinine Clearance: 87.7 mL/min (by C-G formula based on SCr of 0.64 mg/dL). Liver Function Tests: Recent Labs  Lab 09/06/20 0012 09/07/20 0251 09/08/20 0315  AST 83* 85* 66*  ALT 34 41 43  ALKPHOS 59 58 64  BILITOT 0.9 0.4 0.6  PROT 7.3 6.2* 6.5  ALBUMIN 2.2* 1.7* 1.9*   No results for input(s): LIPASE, AMYLASE in the last 168 hours. No results for input(s): AMMONIA in the last 168 hours. Coagulation Profile: Recent Labs  Lab 09/06/20 0012 09/07/20 0251 09/08/20 0315  INR 1.2 1.3* 1.6*   Cardiac Enzymes: Recent Labs  Lab 09/06/20 0012 09/06/20 0528 09/07/20 0251  CKTOTAL 3,952* 4,082* 1,725*   BNP (last 3 results) Recent Labs    06/06/20 0903  PROBNP 1,079*   HbA1C: No results for input(s): HGBA1C in the last 72 hours. CBG: No results for input(s): GLUCAP in the last 168 hours. Lipid Profile: No results for  input(s): CHOL, HDL, LDLCALC, TRIG, CHOLHDL, LDLDIRECT in the last 72 hours. Thyroid Function Tests: No results for input(s): TSH, T4TOTAL, FREET4, T3FREE, THYROIDAB in the last 72 hours. Anemia Panel: No results for input(s): VITAMINB12, FOLATE, FERRITIN, TIBC, IRON, RETICCTPCT in the last 72 hours. Sepsis Labs: Recent Labs  Lab 09/06/20 0012 09/06/20 0214 09/06/20 0528  PROCALCITON  --   --  2.10  LATICACIDVEN 1.7 1.6  --     Recent Results (from the past 240 hour(s))  Blood culture (routine x 2)     Status: None (Preliminary result)   Collection Time: 09/06/20 12:12 AM   Specimen: BLOOD  Result Value Ref Range Status   Specimen Description BLOOD RIGHT ANTECUBITAL  Final   Special Requests   Final    BOTTLES DRAWN AEROBIC AND ANAEROBIC Blood Culture adequate volume   Culture   Final    NO GROWTH 2 DAYS Performed at [facility], [address] [place], [address] [place]    Report Status PENDING  Incomplete  Blood culture (routine x 2)     Status: None (Preliminary result)   Collection Time: 09/06/20  2:14 AM   Specimen: BLOOD  Result Value Ref Range Status   Specimen Description BLOOD LEFT ANTECUBITAL  Final   Special Requests   Final    BOTTLES DRAWN AEROBIC AND ANAEROBIC Blood Culture results may not be optimal due to an excessive volume of blood received in culture bottles   Culture   Final    NO GROWTH 2 DAYS Performed at  [facility], [address] [place] [place], [place] [address]    Report Status PENDING  Incomplete  SARS CORONAVIRUS 2 (TAT 6-24 HRS) Nasopharyngeal Nasopharyngeal Swab     Status: None   Collection Time: 09/06/20  2:14 AM   Specimen: Nasopharyngeal Swab  Result Value Ref Range Status   SARS Coronavirus 2 NEGATIVE NEGATIVE Final    Comment: (NOTE) SARS-CoV-2 target nucleic acids are NOT DETECTED.  The SARS-CoV-2 RNA is generally detectable in upper and lower respiratory specimens during the acute phase of infection. Negative results do  not preclude SARS-CoV-2 infection, do not rule out co-infections with other pathogens, and should not be used as the sole basis for treatment or other patient management decisions. Negative results must be combined with clinical observations, patient history, and epidemiological information. The expected result is Negative.  Fact Sheet for Patients: HairSlick.no  Fact Sheet for Healthcare Providers: quierodirigir.com  This test is not yet approved or cleared by the [place] FDA and  has been authorized for detection and/or diagnosis of SARS-CoV-2 by FDA under an Emergency Use Authorization (EUA). This EUA will remain  in effect (meaning this test can be used) for the duration of the COVID-19 declaration under Se ction 564(b)(1) of the Act, 21 U.S.C. section 360bbb-3(b)(1), unless the authorization is terminated or revoked sooner.  Performed at [facility], [address], [place], [place] [address]   MRSA PCR Screening     Status: Abnormal   Collection Time: 09/07/20 12:38 AM   Specimen: Nasal Mucosa; Nasopharyngeal  Result Value Ref Range Status   MRSA by PCR POSITIVE (A) NEGATIVE Final    Comment:        The GeneXpert MRSA Assay (FDA approved for NASAL specimens only), is one component of a comprehensive MRSA colonization surveillance program. It is not intended to diagnose MRSA infection nor to guide or monitor treatment for MRSA infections. RESULT CALLED TO, READ BACK BY AND VERIFIED WITH: Alicia Amel RN 09/07/20 1660 JDW Performed at [facility], [address], [place], [place] [address]      RN Pressure Injury Documentation:     Estimated body mass index is 52.61 kg/m as calculated from the following:   Height as of this encounter: 5\' 1"  (1.549 m).   Weight as of this encounter: 126.3 kg.  Malnutrition Type:   Malnutrition Characteristics:   Nutrition Interventions:   Radiology  Studies: DG HIP UNILAT WITH PELVIS 2-3 VIEWS RIGHT  Result Date: 09/06/2020 CLINICAL DATA:  Right hip pain after a fall 3 days ago. EXAM: DG HIP (WITH OR WITHOUT PELVIS) 2-3V RIGHT COMPARISON:  None. FINDINGS: Degenerative changes in the right hip with joint space narrowing and osteophyte formation on both sides of the hip. Heterotopic ossification over the greater trochanter. No evidence of acute fracture or dislocation. No focal bone lesions. Soft tissues are unremarkable. IMPRESSION: Degenerative changes in the right hip. Electronically Signed   By: Burman Nieves M.D.   On: 09/06/2020 19:05   Scheduled Meds: . Chlorhexidine Gluconate Cloth  6 each Topical Q0600  . mupirocin ointment  1 application Nasal BID  . warfarin  2.5 mg Oral ONCE-1600  . Warfarin - Pharmacist Dosing Inpatient   Does not apply q1600   Continuous Infusions: . sodium chloride Stopped (09/07/20 0535)  . ceFEPime (MAXIPIME) IV 2 g (09/08/20 0404)  . heparin 2,500 Units/hr (09/08/20 0504)  . vancomycin 1,250 mg (09/08/20 0604)    LOS: 2 days  Caroline Matters Latif Shivan Hodes, DO [facility] PAGER is on AMION  If 7PM-7AM, please contact night-coverage [email or web address]   

## 2020-09-08 NOTE — Progress Notes (Signed)
ANTICOAGULATION CONSULT NOTE - Follow Up Consult  Pharmacy Consult for Heparin and Warfarin Indication: Tissue AVR and hx TIA  Patient Measurements: Height: 5\' 1"  (154.9 cm) Weight: 126.3 kg (278 lb 7.1 oz) IBW/kg (Calculated) : 47.8 Heparin Dosing Weight: 78 kg  Vital Signs: Temp: 97.4 F (36.3 C) (12/31 0404) Temp Source: Oral (12/31 0404) BP: 134/71 (12/31 0725) Pulse Rate: 72 (12/31 0725)  Labs: Recent Labs    09/06/20 0012 09/06/20 0528 09/06/20 1318 09/07/20 0251 09/07/20 0529 09/07/20 1215 09/07/20 2038 09/08/20 0315  HGB 13.3 15.9*  --   --  12.0  --   --  11.4*  HCT 41.7 51.9*  --   --  38.9  --   --  37.9  PLT 254 215  --   --  273  --   --  303  LABPROT 15.0  --   --  15.5*  --   --   --  18.4*  INR 1.2  --   --  1.3*  --   --   --  1.6*  HEPARINUNFRC  --   --    < > <0.10*  --  0.13* 0.20* 0.24*  CREATININE 0.93 0.79  --  0.68  --   --   --  0.64  CKTOTAL 3,952* 4,082*  --  1,725*  --   --   --   --    < > = values in this interval not displayed.    Estimated Creatinine Clearance: 87.7 mL/min (by C-G formula based on SCr of 0.64 mg/dL).   Medications:  Infusions:  . sodium chloride Stopped (09/07/20 0535)  . ceFEPime (MAXIPIME) IV 2 g (09/08/20 0404)  . heparin 2,500 Units/hr (09/08/20 0504)  . vancomycin 1,250 mg (09/08/20 0604)    Assessment: 64 YOF on warfarin PTA for hx tissue AVR + TIA with a low INR on admission and started on a Heparin bridge. Pharmacy consulted for Heparin + Warfarin dosing.  Heparin level this morning is therapeutic after a rate increase earlier today (HL 0.38 << 0.24, goal of 0.3-0.7). INR 1.6 this AM - dose already entered to be given today of 2.5. CBC wnl. No bleeding or issues noted per discussion with RN.  Goal of Therapy:  INR 2-3 Heparin level 0.3-0.7 units/ml Monitor platelets by anticoagulation protocol: Yes   Plan:  - Continue Heparin at 2500 units/hr - Will continue to monitor for any signs/symptoms of  bleeding and will follow up with heparin level in 6 hours to confirm therapeutic  Thank you for allowing pharmacy to be a part of this patient's care.  09/10/20, PharmD, BCPS Clinical Pharmacist Clinical phone for 09/08/2020: 09/10/2020 09/08/2020 7:49 AM   **Pharmacist phone directory can now be found on [email or web address] ([email or web address]).  Listed under [facility].

## 2020-09-08 NOTE — Progress Notes (Signed)
ANTICOAGULATION CONSULT NOTE - Follow Up Consult  Pharmacy Consult for Heparin and Warfarin Indication: Tissue AVR and Hx TIA  Patient Measurements: Height: 5\' 1"  (154.9 cm) Weight: 126.3 kg (278 lb 7.1 oz) IBW/kg (Calculated) : 47.8 Heparin Dosing Weight: 78 kg  Vital Signs: BP: 134/71 (12/31 0725) Pulse Rate: 72 (12/31 0725)  Labs: Recent Labs    09/06/20 0012 09/06/20 0528 09/06/20 1318 09/07/20 0251 09/07/20 0529 09/07/20 1215 09/08/20 0315 09/08/20 1020 09/08/20 1757  HGB 13.3 15.9*  --   --  12.0  --  11.4*  --   --   HCT 41.7 51.9*  --   --  38.9  --  37.9  --   --   PLT 254 215  --   --  273  --  303  --   --   LABPROT 15.0  --   --  15.5*  --   --  18.4*  --   --   INR 1.2  --   --  1.3*  --   --  1.6*  --   --   HEPARINUNFRC  --   --    < > <0.10*  --    < > 0.24* 0.38 0.45  CREATININE 0.93 0.79  --  0.68  --   --  0.64  --   --   CKTOTAL 3,952* 4,082*  --  1,725*  --   --  850*  --   --    < > = values in this interval not displayed.    Estimated Creatinine Clearance: 87.7 mL/min (by C-G formula based on SCr of 0.64 mg/dL).  Assessment: 64 yr old female on warfarin PTA for hx tissue AVR + TIA with a low INR on admission and started on a heparin bridge. Pharmacy was consulted for heparin and warfarin dosing.  Confirmatory heparin level drawn ~7.5 hrs after initial therapeutic level was 0.45 units/ml, which remains within the goal range for this pt. H/H 11.4/37.0, platelets 303. INR this AM was 1.6 (pt has rec'd warfarin 2.5 mg PO X 1 dose today). Per RN, no issues with IV or bleeding observed.  Goal of Therapy:  INR 2-3 Heparin level 0.3-0.7 units/ml Monitor platelets by anticoagulation protocol: Yes   Plan:  Continue heparin infusion at 2500 units/hr Monitor daily heparin level, INR, CBC Monitor for signs/symptoms of bleeding  Thank you for allowing pharmacy to be a part of this patient's care.  06-14-1985, PharmD, BCPS, [facility] Clinical  Pharmacist 09/08/2020 6:38 PM

## 2020-09-08 NOTE — Evaluation (Signed)
Physical Therapy Evaluation Patient Details Name: [name] MRN: [id number] DOB: 09-Jul-1956 Today's Date: 09/08/2020   History of Present Illness  [name] is a 64 y.o. female with medical history significant of dCHF, chronic lymphedema of BLE L>R, CAD, AS s/p AVR with bioprosthetic, TIA, on chronic coumadin with goal INR 2-3.   Pt presents to the ED with worsening LLE erythema.  Onset 6 days ago, progressively worsening, now severe.  Nothing tried for symptoms, nothing makes better or worse. She was planning on coming to ER for symptoms, but had a slip off of bed earlier today, and got stuck between bed and wall for 4 hours.  RIGHT leg was stuck between bed and wall.  Now has RIGHT knee pain and erythema and edema.    Clinical Impression  Patient received in bed, happy to see me as she wants to get up to [place] [facility] and nursing told her she couldn't until she was assessed by PT. Patient is mod independent with supine to sit. Heavy use of bed rail and increased time. She requires increased time for performing sit to stand transfer partially due to fear. She is able to come to standing after 2 attempts from raised bed with +2 mod/max assist. Once fully standing she is able to pivot a few steps from bed to [facility].  Patient then stood again and pivoted to recliner with +2 mod assist. She will continue to benefit from skilled PT while here to improve strength, safety and functional mobility/independence.        Follow Up Recommendations SNF;Supervision/Assistance - 24 hour    Equipment Recommendations  None recommended by PT    Recommendations for Other Services       Precautions / Restrictions Precautions Precautions: Fall Restrictions Weight Bearing Restrictions: No      Mobility  Bed Mobility Overal bed mobility: Needs Assistance Bed Mobility: Supine to Sit     Supine to sit: Supervision     General bed mobility comments: heavy use of bed rails, increased effort     Transfers Overall transfer level: Needs assistance Equipment used: Rolling walker (2 wheeled) Transfers: Sit to/from [employer] Sit to Stand: Mod assist;+2 physical assistance;+2 safety/equipment;From elevated surface Stand pivot transfers: Mod assist;+2 physical assistance;+2 safety/equipment       General transfer comment: patient able to transfer from elevated bed and BSC with +2 assist. Cues for safety, hand placement  Ambulation/Gait Ambulation/Gait assistance: Mod assist;+2 physical assistance;+2 safety/equipment Gait Distance (Feet): 3 Feet Assistive device: Rolling walker (2 wheeled) Gait Pattern/deviations: Step-to pattern;Decreased step length - right;Decreased step length - left;Shuffle Gait velocity: decr   General Gait Details: requires mod assist for a few steps from bed>BSC>recliner  Stairs            Wheelchair Mobility    Modified Rankin (Stroke Patients Only)       Balance Overall balance assessment: Needs assistance Sitting-balance support: Feet supported Sitting balance-Leahy Scale: Fair     Standing balance support: Bilateral upper extremity supported;During functional activity Standing balance-Leahy Scale: Poor Standing balance comment: requires mod assist for safety with standing. Patient is fearful of falling.                             Pertinent Vitals/Pain Pain Assessment: Faces Faces Pain Scale: Hurts little more Pain Location: R LE Pain Descriptors / Indicators: Burning;Sore Pain Intervention(s): Limited activity within patient's tolerance;Monitored during session;Repositioned  Home Living Family/patient expects to be discharged to:: Private residence Living Arrangements: Spouse/significant other Available Help at Discharge: Family;Available 24 hours/day Type of Home: House       Home Layout: One level Home Equipment: Walker - 4 wheels      Prior Function Level of Independence: Independent  with assistive device(s)         Comments: uses rollator at baseline     Hand Dominance        Extremity/Trunk Assessment   Upper Extremity Assessment Upper Extremity Assessment: Generalized weakness    Lower Extremity Assessment Lower Extremity Assessment: Generalized weakness    Cervical / Trunk Assessment Cervical / Trunk Assessment: Normal  Communication   Communication: No difficulties  Cognition Arousal/Alertness: Awake/alert Behavior During Therapy: WFL for tasks assessed/performed Overall Cognitive Status: Within Functional Limits for tasks assessed                                        General Comments      Exercises     Assessment/Plan    PT Assessment Patient needs continued PT services  PT Problem List Decreased strength;Decreased mobility;Decreased activity tolerance;Decreased balance;Pain;Obesity       PT Treatment Interventions DME instruction;Therapeutic activities;Gait training;Therapeutic exercise;Patient/family education;Balance training;Functional mobility training    PT Goals (Current goals can be found in the Care Plan section)  Acute Rehab PT Goals Patient Stated Goal: she is willing to go to rehab if needed. Would like to return home PT Goal Formulation: With patient Time For Goal Achievement: 09/22/20 Potential to Achieve Goals: Fair    Frequency Min 3X/week   Barriers to discharge Decreased caregiver support      Co-evaluation               AM-PAC PT "6 Clicks" Mobility  Outcome Measure Help needed turning from your back to your side while in a flat bed without using bedrails?: A Little Help needed moving from lying on your back to sitting on the side of a flat bed without using bedrails?: A Lot Help needed moving to and from a bed to a chair (including a wheelchair)?: A Lot Help needed standing up from a chair using your arms (e.g., wheelchair or bedside chair)?: A Lot Help needed to walk in  hospital room?: Total Help needed climbing 3-5 steps with a railing? : Total 6 Click Score: 11    End of Session Equipment Utilized During Treatment: Gait belt Activity Tolerance: Patient limited by fatigue Patient left: in chair;with call bell/phone within reach Nurse Communication: Mobility status PT Visit Diagnosis: Unsteadiness on feet (R26.81);History of falling (Z91.81);Other abnormalities of gait and mobility (R26.89);Muscle weakness (generalized) (M62.81);Difficulty in walking, not elsewhere classified (R26.2);Pain Pain - Right/Left: Right Pain - part of body: Leg    Time: 1240-1325 PT Time Calculation (min) (ACUTE ONLY): 45 min   Charges:   PT Evaluation $PT Eval Moderate Complexity: 1 Mod PT Treatments $Gait Training: 8-22 mins $Therapeutic Activity: 8-22 mins        Letizia Hook, PT, GCS 09/08/20,2:30 PM

## 2020-09-08 NOTE — TOC Initial Note (Signed)
Transition of Care [facility]) - Initial/Assessment Note    Patient Details  Name: [name] MRN: [id number] Date of Birth: 14-Sep-1955  Transition [facility] CM/SW Contact:    Bethann Berkshire, [place] Phone Number: 09/08/2020, 4:04 PM  Clinical Narrative:                  CSW met with pt and pt husband bedside. Discussed SNF recommendation. Pt is agreeable to SNF workup though expresses optimism in being able to improve to return directly home prior to d/c date. Husband states that he works and is unable to care for pt if she were to return home unless she were ambulating independently. They live at home with pt's son as well. Pt reports she has a rollator at home. Preference for SNF facility in [place] area. CSW completed FL2 and sent bed requests in the hub.   Expected Discharge Plan: Skilled Nursing Facility Barriers to Discharge: Continued Medical Work up   Patient Goals and CMS Choice Patient states their goals for this hospitalization and ongoing recovery are:: Improve physical ability to ambulate independently CMS Medicare.gov Compare Post Acute Care list provided to:: Patient Choice offered to / list presented to : Patient  Expected Discharge Plan and Services Expected Discharge Plan: [place]       Living arrangements for the past 2 months: Single Family Home                                      Prior Living Arrangements/Services Living arrangements for the past 2 months: Single Family Home Lives with:: Spouse Patient language and need for interpreter reviewed:: Yes Do you feel safe going back to the place where you live?: Yes      Need for Family Participation in Patient Care: Yes (Comment) Care giver support system in place?: Yes (comment) Current home services: DME Criminal Activity/Legal Involvement Pertinent to Current Situation/Hospitalization: No - Comment as needed  Activities of Daily Living Home Assistive Devices/Equipment:  Eyeglasses,CPAP,Walker (specify type) ADL Screening (condition at time of admission) Patient's cognitive ability adequate to safely complete daily activities?: Yes Is the patient deaf or have difficulty hearing?: No Does the patient have difficulty seeing, even when wearing glasses/contacts?: No Does the patient have difficulty concentrating, remembering, or making decisions?: No Patient able to express need for assistance with ADLs?: Yes Does the patient have difficulty dressing or bathing?: Yes Independently performs ADLs?: No Communication: Independent Dressing (OT): Needs assistance Is this a change from baseline?: Pre-admission baseline Grooming: Needs assistance Is this a change from baseline?: Pre-admission baseline Feeding: Independent Bathing: Needs assistance Is this a change from baseline?: Pre-admission baseline Toileting: Needs assistance Is this a change from baseline?: Pre-admission baseline In/Out Bed: Needs assistance Is this a change from baseline?: Change from baseline, expected to last <3 days Walks in Home: Needs assistance Is this a change from baseline?: Change from baseline, expected to last <3 days Does the patient have difficulty walking or climbing stairs?: Yes Weakness of Legs: Both Weakness of Arms/Hands: Both  Permission Sought/Granted   Permission granted to share information with : Yes, Verbal Permission Granted  Share Information with NAME: Rilya, Longo (Spouse)   [phone] (Mobile)           Emotional Assessment Appearance:: Appears stated age Attitude/Demeanor/Rapport: Engaged Affect (typically observed): Accepting Orientation: : Oriented to Self,Oriented to Place,Oriented to  Time,Oriented to Situation Alcohol /  Substance Use: Not Applicable Psych Involvement: No (comment)  Admission diagnosis:  Cellulitis of left lower extremity [L03.116] Traumatic rhabdomyolysis, initial encounter ([place]) [T79.6XXA] Sepsis due to cellulitis (HCC)  [L03.90, A41.9] Sepsis without acute organ dysfunction, due to unspecified organism [facility]) [A41.9] Patient Active Problem List   Diagnosis Date Noted  . Sepsis due to cellulitis ([place]) 09/06/2020  . Heart failure ([place])   . Bicuspid aortic valve   . Iron deficiency anemia due to chronic blood loss 10/19/2019  . Insomnia 10/07/2019  . Encounter for insertion of mirena IUD 08/17/2019  . Long term (current) use of anticoagulants 06/11/2019  . TIA (transient ischemic attack) 06/04/2019  . Transient neurologic deficit 06/03/2019  . Anemia 06/03/2019  . Chronic diastolic CHF (congestive heart failure) ([place]) 06/03/2019  . CAD (coronary artery disease) 06/03/2019  . Essential hypertension 06/03/2019  . Avulsed toenail, initial encounter 05/27/2019  . Infection of toenail 05/27/2019  . Sleep apnea 09/25/2018  . Obstructive hypertrophic cardiomyopathy ([place]) 07/10/2018  . Dysfunctional uterine bleeding 04/22/2018  . Postmenopausal bleeding 04/16/2018  . Edema of both legs 02/20/2017  . 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 Morbid obesity ([place]) 02/20/2017  . Aortic stenosis 03/23/2013  . Hypertensive heart disease with heart failure ([place]) 03/23/2013  . HTN (hypertension) 03/23/2013   PCP:  [name], MD Pharmacy:   [place] [place] [address] [place] [address] Phone: [phone] Fax: [phone]     Social Determinants of Health (SDOH) Interventions    Readmission Risk Interventions No flowsheet data found.

## 2020-09-08 NOTE — Progress Notes (Signed)
Patient has home CPAP for use and will self place when ready.  

## 2020-09-09 ENCOUNTER — Encounter (HOSPITAL_COMMUNITY): Payer: Self-pay | Admitting: Internal Medicine

## 2020-09-09 DIAGNOSIS — R600 Localized edema: Secondary | ICD-10-CM | POA: Diagnosis not present

## 2020-09-09 DIAGNOSIS — A419 Sepsis, unspecified organism: Secondary | ICD-10-CM | POA: Diagnosis not present

## 2020-09-09 DIAGNOSIS — I5032 Chronic diastolic (congestive) heart failure: Secondary | ICD-10-CM | POA: Diagnosis not present

## 2020-09-09 DIAGNOSIS — L0390 Cellulitis, unspecified: Secondary | ICD-10-CM | POA: Diagnosis not present

## 2020-09-09 LAB — CBC WITH DIFFERENTIAL/PLATELET
Abs Immature Granulocytes: 1.31 10*3/uL — ABNORMAL HIGH (ref 0.00–0.07)
Basophils Absolute: 0.2 10*3/uL — ABNORMAL HIGH (ref 0.0–0.1)
Basophils Relative: 1 %
Eosinophils Absolute: 0.9 10*3/uL — ABNORMAL HIGH (ref 0.0–0.5)
Eosinophils Relative: 6 %
HCT: 41.5 % (ref 36.0–46.0)
Hemoglobin: 12.2 g/dL (ref 12.0–15.0)
Immature Granulocytes: 8 %
Lymphocytes Relative: 14 %
Lymphs Abs: 2.2 10*3/uL (ref 0.7–4.0)
MCH: 26.3 pg (ref 26.0–34.0)
MCHC: 29.4 g/dL — ABNORMAL LOW (ref 30.0–36.0)
MCV: 89.4 fL (ref 80.0–100.0)
Monocytes Absolute: 0.5 10*3/uL (ref 0.1–1.0)
Monocytes Relative: 3 %
Neutro Abs: 10.5 10*3/uL — ABNORMAL HIGH (ref 1.7–7.7)
Neutrophils Relative %: 68 %
Platelets: 268 10*3/uL (ref 150–400)
RBC: 4.64 MIL/uL (ref 3.87–5.11)
RDW: 16.4 % — ABNORMAL HIGH (ref 11.5–15.5)
WBC: 15.6 10*3/uL — ABNORMAL HIGH (ref 4.0–10.5)
nRBC: 0 % (ref 0.0–0.2)

## 2020-09-09 LAB — COMPREHENSIVE METABOLIC PANEL
ALT: 45 U/L — ABNORMAL HIGH (ref 0–44)
AST: 60 U/L — ABNORMAL HIGH (ref 15–41)
Albumin: 2.1 g/dL — ABNORMAL LOW (ref 3.5–5.0)
Alkaline Phosphatase: 60 U/L (ref 38–126)
Anion gap: 10 (ref 5–15)
BUN: 7 mg/dL — ABNORMAL LOW (ref 8–23)
CO2: 24 mmol/L (ref 22–32)
Calcium: 9 mg/dL (ref 8.9–10.3)
Chloride: 101 mmol/L (ref 98–111)
Creatinine, Ser: 0.72 mg/dL (ref 0.44–1.00)
GFR, Estimated: >60 mL/min (ref >60)
Glucose, Bld: 102 mg/dL — ABNORMAL HIGH (ref 70–99)
Potassium: 4 mmol/L (ref 3.5–5.1)
Sodium: 135 mmol/L (ref 135–145)
Total Bilirubin: 0.5 mg/dL (ref 0.3–1.2)
Total Protein: 7.1 g/dL (ref 6.5–8.1)

## 2020-09-09 LAB — HEMOGLOBIN A1C
Hgb A1c MFr Bld: 6.1 % — ABNORMAL HIGH (ref 4.8–5.6)
Mean Plasma Glucose: 128.37 mg/dL

## 2020-09-09 LAB — PROTIME-INR
INR: 2 — ABNORMAL HIGH (ref 0.8–1.2)
Prothrombin Time: 21.6 seconds — ABNORMAL HIGH (ref 11.4–15.2)

## 2020-09-09 LAB — CK: Total CK: 463 U/L — ABNORMAL HIGH (ref 38–234)

## 2020-09-09 LAB — HEPARIN LEVEL (UNFRACTIONATED): Heparin Unfractionated: 0.33 IU/mL (ref 0.30–0.70)

## 2020-09-09 LAB — MAGNESIUM: Magnesium: 2.2 mg/dL (ref 1.7–2.4)

## 2020-09-09 LAB — PHOSPHORUS: Phosphorus: 3.5 mg/dL (ref 2.5–4.6)

## 2020-09-09 MED ORDER — VANCOMYCIN HCL IN DEXTROSE 1-5 GM/200ML-% IV SOLN
1000.0000 mg | Freq: Two times a day (BID) | INTRAVENOUS | Status: DC
  Administered 2020-09-09: 1000 mg via INTRAVENOUS
  Filled 2020-09-09: qty 200

## 2020-09-09 MED ORDER — WARFARIN SODIUM 2.5 MG PO TABS
2.5000 mg | ORAL_TABLET | Freq: Once | ORAL | Status: AC
  Administered 2020-09-09: 2.5 mg via ORAL
  Filled 2020-09-09: qty 1

## 2020-09-09 MED ORDER — SODIUM CHLORIDE 0.9 % IV SOLN
2.0000 g | INTRAVENOUS | Status: DC
  Administered 2020-09-09: 2 g via INTRAVENOUS
  Filled 2020-09-09: qty 20

## 2020-09-09 MED ORDER — VANCOMYCIN HCL IN DEXTROSE 1-5 GM/200ML-% IV SOLN
1000.0000 mg | Freq: Two times a day (BID) | INTRAVENOUS | Status: DC
  Filled 2020-09-09: qty 200

## 2020-09-09 NOTE — Progress Notes (Signed)
Physical Therapy Treatment Patient Details Name: [name] MRN: [id number] DOB: [birth year] Today's Date: 09/09/2020    History of Present Illness JOSEE SPEECE is a 65 y.o. female with medical history significant of dCHF, chronic lymphedema of BLE L>R, CAD, AS s/p AVR with bioprosthetic, TIA, on chronic coumadin with goal INR 2-3.   Pt presents to the ED with worsening LLE erythema.  Onset 6 days ago, progressively worsening, now severe.  Nothing tried for symptoms, nothing makes better or worse. She was planning on coming to ER for symptoms, but had a slip off of bed earlier today, and got stuck between bed and wall for 4 hours.  RIGHT leg was stuck between bed and wall.  Now has RIGHT knee pain and erythema and edema.    PT Comments    Pt with improved transfer ability and ambulation tolerance. Very labored effort to amb with R drop foot and c/o "My foot wont do what I tell it. It's just pins and needles" Continue to recommend SNF upon d/c to achieve safe mod I level of function for safe transition home.    Follow Up Recommendations  SNF;Supervision/Assistance - 24 hour     Equipment Recommendations  None recommended by PT    Recommendations for Other Services       Precautions / Restrictions Precautions Precautions: Fall Precaution Comments: open and blistered areas to B LE's Restrictions Weight Bearing Restrictions: No    Mobility  Bed Mobility Overal bed mobility: Needs Assistance Bed Mobility: Supine to Sit     Supine to sit: Min assist     General bed mobility comments: minA for trunk elevation, able to bring LEs off EOB  Transfers Overall transfer level: Needs assistance Equipment used: Rolling walker (2 wheeled) Transfers: Sit to/from Stand Sit to Stand: Mod assist;+2 physical assistance;+2 safety/equipment;From elevated surface         General transfer comment: rockign momentum and modAx2 to power up, pt required 2 attempts, pt unable to push up from  bed, PT and tech had to hold walker down for her to pull up on  Ambulation/Gait Ambulation/Gait assistance: Mod assist;+2 safety/equipment Gait Distance (Feet): 10 Feet Assistive device: Rolling walker (2 wheeled) Gait Pattern/deviations: Step-to pattern;Decreased step length - right;Decreased step length - left;Shuffle Gait velocity: decreased   General Gait Details: verbal cues for sequencing with R LE first, minA for walker management, tech for line management and chair follow, pt very fatigued   Stairs             Wheelchair Mobility    Modified Rankin (Stroke Patients Only)       Balance Overall balance assessment: Needs assistance Sitting-balance support: Feet supported Sitting balance-Leahy Scale: Fair     Standing balance support: Bilateral upper extremity supported;During functional activity Standing balance-Leahy Scale: Poor Standing balance comment: requires mod assist for safety with standing. Patient is fearful of falling.                            Cognition Arousal/Alertness: Awake/alert Behavior During Therapy: WFL for tasks assessed/performed Overall Cognitive Status: Within Functional Limits for tasks assessed                                 General Comments: easily distracted with tangential speech      Exercises      General Comments General comments (skin integrity,  edema, etc.): pt with bilat LE cellulitis, lymphedema      Pertinent Vitals/Pain Pain Assessment: 0-10 Pain Score: 7  Pain Location: R LE, foot still pins and needles Pain Descriptors / Indicators: Pins and needles Pain Intervention(s): Monitored during session    Home Living                      Prior Function            PT Goals (current goals can now be found in the care plan section) Progress towards PT goals: Progressing toward goals    Frequency    Min 3X/week      PT Plan Current plan remains appropriate     Co-evaluation              AM-PAC PT "6 Clicks" Mobility   Outcome Measure  Help needed turning from your back to your side while in a flat bed without using bedrails?: A Little Help needed moving from lying on your back to sitting on the side of a flat bed without using bedrails?: A Lot Help needed moving to and from a bed to a chair (including a wheelchair)?: A Lot Help needed standing up from a chair using your arms (e.g., wheelchair or bedside chair)?: A Lot Help needed to walk in hospital room?: A Lot Help needed climbing 3-5 steps with a railing? : Total 6 Click Score: 12    End of Session Equipment Utilized During Treatment: Gait belt Activity Tolerance: Patient limited by fatigue Patient left: in chair;with call bell/phone within reach Nurse Communication: Mobility status PT Visit Diagnosis: Unsteadiness on feet (R26.81);History of falling (Z91.81);Other abnormalities of gait and mobility (R26.89);Muscle weakness (generalized) (M62.81);Difficulty in walking, not elsewhere classified (R26.2);Pain Pain - Right/Left: Right Pain - part of body: Leg     Time: 1240-1300 PT Time Calculation (min) (ACUTE ONLY): 20 min  Charges:  $Gait Training: 8-22 mins                     Kittie Plater, PT, DPT Acute Rehabilitation Services Pager #: [phone] Office #: [phone]    Berline Lopes 09/09/2020, 3:52 PM

## 2020-09-09 NOTE — Progress Notes (Signed)
PROGRESS NOTE    [name]  [id number] DOB: 19-Apr-1956 DOA: 09/05/2020 PCP: [name], MD   Brief Narrative:  The patient is a super morbidly obese Caucasian female with a past medical history significant for but not limited to chronic lymphedema of the bilateral lower extremities with left being greater than right, CAD, history of aortic stenosis status post AVR with bioprosthetic valve on chronic anticoagulation with Coumadin with a goal INR 2-3, history of TIA, as well as other comorbidities who presented after a fall and was found to have worsening left lower extremity erythema.  Patient states the onset was about a week ago and progressively gotten worse and was severe.  She slipped off the bed and got stuck between the bed and the wall for approximately hours.  Now complains of right knee and hip pain.  Left leg was worse with erythema and edema.  Please see the pictures and Dr. Juleen China note.  **Interim History Sepsis physiology is improved and we have changed her daptomycin to vancomycin.  She is tolerating her vancomycin just fine.  WBC had initially improved now slightly worsened.  We will repeat her procalcitonin level in the a.m.  Patient thinks that her leg looks a little bit better but I have asked the nurse to mark the skin today.  We will continue to monitor and if she is not improving will obtain imaging of her leg and ID consultation.  She will need to have a PT OT consultation as well.  Rhabdomyolysis is improving.  INR still remained subtherapeutic and she remains on a heparin bridge and it is now 2.0.  Will need to continue to monitor for signs and symptoms of bleeding.  Repeat CK this morning is 463.  WBC is slightly improved.  We will continue IV vancomycin but will deescalate IV cefepime to IV Ceftriaxone and may discuss with infectious diseases for further recommendations on transitioning.  T OT evaluated and recommending skilled nursing facility.  Her numbers  are improving and she is agreeable to SNF but currently has no bed offers.  Assessment & Plan:   Principal Problem:   Sepsis due to cellulitis [facility]) Active Problems:   Edema of both legs   H/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   Morbid obesity (HCC)   Chronic diastolic CHF (congestive heart failure) (HCC)  Sepsis due to LLE cellulitis, this is improving -Sepsis was present on admission; she was documented to have a tachypnea with a respiratory rate of 22, tachycardia with a pulse rate of 107, and a leukocytosis of 23.8 -Was continued on IV Cefepime but will de-escalate this to IV Ceftriaxone now, placed on daptomycin in the ED but this is may change to IV vancomycin given that her allergy is just itching -Patient had some minor itching today so will start Hydroxyzine 25 mg po TIDprn -MRSA PCR nares was positive -BCx pending -PCT was 2.10; WBC was 23.8 and is improved to 15.6 today -Wound care consult for leg -No subq emphysema, dont think she has Geneticist, molecular. -I have asked the nurse to mark her skin view response to antibiotics -We will obtain PT OT evaluation they are recommending skilled nursing facility and patient is agreeable to this; [facility] consulted for assistance with placement but currently she has no bed offers  Right knee pain -Improving -Had a CT scan of her knee which showed "No acute fracture or dislocation. Edema around the medial patellar retinaculum with slight lateral subluxation of the patella.  Probable small loose body in the region of the posterior medial meniscus." -Continue to monitor and PT OT recommending SNF when her knee pain is getting better  Right Hip Pain -DG X-Ray showed "Degenerative changes in the right hip with joint space narrowing and osteophyte formation on both sides of the hip. Heterotopic ossification over the greater trochanter. No evidence of acute fracture or dislocation. No focal bone lesions. Soft tissues are  unremarkable."  Rhabdomyolysis -CPK went from 3900 -> 4082 -> 1725 -> 850 -> 463 -UA pending, urine looks very dark -IVF: giving another 1L bolus now (1.25L total bolus volume) and 150 cc/hr LR has now been discontinued and she just remains on a heparin drip with Coumadin -Repeat CPK in AM -Strict intake and output; encourage oral intake  Abnormal LFTS -In the setting of rhabdomyolysis and were initially improving.  AST went from 85 -> 66 -> 60 -ALT has now gone from 41 -> 43 -> 45 -Continue to monitor and trend and repeat CMP in a.m. -if necessary will obtain a right upper quadrant ultrasound and a acute hepatitis panel but will hold off for now  Chronic dCHF  History of Aortic stenosis status post AVR bioprosthetic valve -Holding diuretics -Holding metoprolol -Coumadin per pharm consult, on coumadin chronically for stroke prevention, currently INR 2.0, goal INR 2-3 per patient.   We will continue to bridge the patient with heparin drip and Coumadin until her INR is therapeutic -IV fluid hydration is now stopped; She is +3.7745 Liters -Continue monitor for signs and symptoms of volume overload  -Strict I's and O's and daily weights -C/w Heparin bridge for the moment and transition off in the next few days  Hyponatremia -Mild and improving -Na+ went from 129 -> 131 and is 133 -> 135 again today -C/w IVF Hydration -Continue to Monitor and Trend  Super Morbid Obesity -Complicates overall prognosis and care -Estimated body mass index is 51.82 kg/m as calculated from the following:   Height as of this encounter: 5\' 1"  (1.549 m).   Weight as of this encounter: 124.4 kg. -Weight loss and dietary counseling given  Hyperglycemia in a patient with a history of diabetes mellitus type 2 -Checked Hemoglobin A1c is now 6.1; last hemoglobin A1c was 7.0 a year ago -Continue to monitor blood sugars carefully and if necessary will place on sensitive NovoLog/scale insulin -Blood sugars  have been ranging from 87-119 on daily BMPs/CMP -Nutritionist consulted for diet education  Normocytic Anemia -Patient's hemoglobin/hematocrit went from 50.9/51.9 -> 12.0/38.9 -> 11.4/37.9 -> 12.2/41.5 -Check anemia panel in the a.m. -Continue to monitor for signs and symptoms of bleeding as she is anticoagulated on a heparin drip as well as Coumadin; currently no overt bleeding noted -Repeat CBC in the a.m.   DVT prophylaxis: Anticoagulated with a heparin drip as well as Coumadin given Subtherapeutic INR Code Status: FULL CODE Family Communication: No family present at bedside  Disposition Plan: SNF at discharge; she is improving from a cellulitis standpoint and is close to having a therapeutic INR  Status is: Inpatient  Remains inpatient appropriate because:Unsafe d/c plan, IV treatments appropriate due to intensity of illness or inability to take PO and Inpatient level of care appropriate due to severity of illness   Dispo: The patient is from: Home              Anticipated d/c is to: Home              Anticipated d/c date is: 1-2 days  Patient currently is not medically stable to d/c.  Consultants:   None   Procedures: None  Antimicrobials:  Anti-infectives (From admission, onward)   Start     Dose/Rate Route Frequency Ordered Stop   09/09/20 1800  vancomycin (VANCOCIN) IVPB 1000 mg/200 mL premix        1,000 mg 100 mL/hr over 120 Minutes Intravenous Every 12 hours 09/09/20 0952     09/08/20 2000  cefTRIAXone (ROCEPHIN) 2 g in sodium chloride 0.9 % 100 mL IVPB        2 g 200 mL/hr over 30 Minutes Intravenous Every 24 hours 09/08/20 1451     09/07/20 0600  vancomycin (VANCOREADY) IVPB 1250 mg/250 mL  Status:  Discontinued        1,250 mg 125 mL/hr over 120 Minutes Intravenous Every 12 hours 09/06/20 1617 09/09/20 0952   09/06/20 1715  vancomycin (VANCOREADY) IVPB 2000 mg/400 mL        2,000 mg 133.3 mL/hr over 180 Minutes Intravenous  Once 09/06/20 1617  09/06/20 2037   09/06/20 0600  metroNIDAZOLE (FLAGYL) tablet 500 mg  Status:  Discontinued        500 mg Oral Every 8 hours 09/06/20 0343 09/06/20 0428   09/06/20 0400  ceFEPIme (MAXIPIME) 2 g in sodium chloride 0.9 % 100 mL IVPB  Status:  Discontinued        2 g 200 mL/hr over 30 Minutes Intravenous Every 8 hours 09/06/20 0348 09/08/20 1451   09/05/20 2345  DAPTOmycin (CUBICIN) 500 mg in sodium chloride 0.9 % IVPB  Status:  Discontinued        500 mg 220 mL/hr over 30 Minutes Intravenous Daily 09/05/20 2336 09/06/20 1837   09/05/20 2330  cefTRIAXone (ROCEPHIN) 2 g in sodium chloride 0.9 % 100 mL IVPB        2 g 200 mL/hr over 30 Minutes Intravenous  Once 09/05/20 2319 09/06/20 0301        Subjective: Seen and examined at bedside and thinks that she is doing little bit better today than she was yesterday and continues to have more motion in her right leg.  States that she has some paresthesias in her right foot that happened ever since she fell but no actual pain now.  She gets more motion every day.  States that she stayed up last night to watch the ball drop and then watched the musical concert after and went to bed at 2 AM.  No nausea or vomiting.  No other concerns or complaints at this time and states itching is improved.  Objective: Vitals:   09/09/20 0316 09/09/20 0319 09/09/20 0721 09/09/20 1113  BP:  (!) 153/70 (!) 135/106 (!) 141/64  Pulse:  82 85 84  Resp:  20 (!) 21 20  Temp:  97.8 F (36.6 C) 98.8 F (37.1 C) 98.5 F (36.9 C)  TempSrc:  Oral Oral Oral  SpO2:  92% 94% 96%  Weight: 124.4 kg     Height:        Intake/Output Summary (Last 24 hours) at 09/09/2020 1209 Last data filed at 09/09/2020 0930 Gross per 24 hour  Intake 3079.71 ml  Output 1100 ml  Net 1979.71 ml   Filed Weights   09/07/20 0341 09/08/20 0404 09/09/20 0316  Weight: 124.5 kg 126.3 kg 124.4 kg   Examination: Physical Exam:  Constitutional: WN/WD super morbidly obese pleasant Caucasian female  currently no acute distress appears calm and comfortable Eyes: Lids and conjunctivae normal, sclerae anicteric  ENMT: External Ears, Nose appear normal. Grossly normal hearing. Neck: Appears normal, supple, no cervical masses, normal ROM, no appreciable thyromegaly; no JVD is difficult to assess given her body habitus Respiratory: Diminished to auscultation bilaterally, no wheezing, rales, rhonchi or crackles. Normal respiratory effort and patient is not tachypenic. No accessory muscle use.  Unlabored breathing Cardiovascular: RRR, no murmurs / rubs / gallops. S1 and S2 auscultated.  Some mild lower extremity edema Abdomen: Soft, non-tender, Distended 2/2 body habitus.  Bowel sounds positive.  GU: Deferred. Musculoskeletal: No clubbing / cyanosis of digits/nails. No joint deformity upper and lower extremities.  Skin:  Her left leg is wrapped with her wounds in the distal leg and she has some erythema which is improving and warmth in the distal thigh medially with some blistering slightly. No induration; Warm and dry.  Neurologic: CN 2-12 grossly intact with no focal deficits. Romberg sign and cerebellar reflexes not assessed.  Psychiatric: Normal judgment and insight. Alert and oriented x 3. Normal mood and appropriate affect.   Data Reviewed: I have personally reviewed following labs and imaging studies  CBC: Recent Labs  Lab 09/06/20 0012 09/06/20 0528 09/07/20 0529 09/08/20 0315 09/09/20 0218  WBC 23.8* 16.7* 19.2* 18.9* 15.6*  NEUTROABS 20.3*  --  14.1* 12.8* 10.5*  HGB 13.3 15.9* 12.0 11.4* 12.2  HCT 41.7 51.9* 38.9 37.9 41.5  MCV 85.1 85.6 86.6 88.1 89.4  PLT 254 215 273 303 [id number]   Basic Metabolic Panel: Recent Labs  Lab 09/06/20 0012 09/06/20 0528 09/07/20 0251 09/08/20 0315 09/09/20 0218  NA 129* 131* 133* 135 135  K 3.9 3.8 3.7 3.9 4.0  CL 95* 98 103 103 101  CO2 25 22 22 23 24   GLUCOSE 122* 108* 119* 87 102*  BUN 18 15 9  7* 7*  CREATININE 0.93 0.79 0.68 0.64 0.72   CALCIUM 9.5 9.2 8.8* 8.9 9.0  MG  --   --  2.1 2.2 2.2  PHOS  --   --  3.1 3.4 3.5   GFR: Estimated Creatinine Clearance: 86.8 mL/min (by C-G formula based on SCr of 0.72 mg/dL). Liver Function Tests: Recent Labs  Lab 09/06/20 0012 09/07/20 0251 09/08/20 0315 09/09/20 0218  AST 83* 85* 66* 60*  ALT 34 41 43 45*  ALKPHOS 59 58 64 60  BILITOT 0.9 0.4 0.6 0.5  PROT 7.3 6.2* 6.5 7.1  ALBUMIN 2.2* 1.7* 1.9* 2.1*   No results for input(s): LIPASE, AMYLASE in the last 168 hours. No results for input(s): AMMONIA in the last 168 hours. Coagulation Profile: Recent Labs  Lab 09/06/20 0012 09/07/20 0251 09/08/20 0315 09/09/20 0218  INR 1.2 1.3* 1.6* 2.0*   Cardiac Enzymes: Recent Labs  Lab 09/06/20 0012 09/06/20 0528 09/07/20 0251 09/08/20 0315 09/09/20 0218  CKTOTAL 3,952* 4,082* 1,725* 850* 463*   BNP (last 3 results) Recent Labs    06/06/20 0903  PROBNP 1,079*   HbA1C: Recent Labs    09/09/20 0218  HGBA1C 6.1*   CBG: No results for input(s): GLUCAP in the last 168 hours. Lipid Profile: No results for input(s): CHOL, HDL, LDLCALC, TRIG, CHOLHDL, LDLDIRECT in the last 72 hours. Thyroid Function Tests: No results for input(s): TSH, T4TOTAL, FREET4, T3FREE, THYROIDAB in the last 72 hours. Anemia Panel: No results for input(s): VITAMINB12, FOLATE, FERRITIN, TIBC, IRON, RETICCTPCT in the last 72 hours. Sepsis Labs: Recent Labs  Lab 09/06/20 0012 09/06/20 0214 09/06/20 0528  PROCALCITON  --   --  2.10  LATICACIDVEN 1.7 1.6  --  Recent Results (from the past 240 hour(s))  Blood culture (routine x 2)     Status: None (Preliminary result)   Collection Time: 09/06/20 12:12 AM   Specimen: BLOOD  Result Value Ref Range Status   Specimen Description BLOOD RIGHT ANTECUBITAL  Final   Special Requests   Final    BOTTLES DRAWN AEROBIC AND ANAEROBIC Blood Culture adequate volume   Culture   Final    NO GROWTH 3 DAYS Performed at [facility], [address], [place], [place] [address]    Report Status PENDING  Incomplete  Blood culture (routine x 2)     Status: None (Preliminary result)   Collection Time: 09/06/20  2:14 AM   Specimen: BLOOD  Result Value Ref Range Status   Specimen Description BLOOD LEFT ANTECUBITAL  Final   Special Requests   Final    BOTTLES DRAWN AEROBIC AND ANAEROBIC Blood Culture results may not be optimal due to an excessive volume of blood received in culture bottles   Culture   Final    NO GROWTH 3 DAYS Performed at [facility], [address] [place], [place] [address]    Report Status PENDING  Incomplete  SARS CORONAVIRUS 2 (TAT 6-24 HRS) Nasopharyngeal Nasopharyngeal Swab     Status: None   Collection Time: 09/06/20  2:14 AM   Specimen: Nasopharyngeal Swab  Result Value Ref Range Status   SARS Coronavirus 2 NEGATIVE NEGATIVE Final    Comment: (NOTE) SARS-CoV-2 target nucleic acids are NOT DETECTED.  The SARS-CoV-2 RNA is generally detectable in upper and lower respiratory specimens during the acute phase of infection. Negative results do not preclude SARS-CoV-2 infection, do not rule out co-infections with other pathogens, and should not be used as the sole basis for treatment or other patient management decisions. Negative results must be combined with clinical observations, patient history, and epidemiological information. The expected result is Negative.  Fact Sheet for Patients: HairSlick.no  Fact Sheet for Healthcare Providers: quierodirigir.com  This test is not yet approved or cleared by the [place] FDA and  has been authorized for detection and/or diagnosis of SARS-CoV-2 by FDA under an Emergency Use Authorization (EUA). This EUA will remain  in effect (meaning this test can be used) for the duration of the COVID-19 declaration under Se ction 564(b)(1) of the Act, 21 U.S.C. section 360bbb-3(b)(1), unless the  authorization is terminated or revoked sooner.  Performed at [facility], [address] [place], [place] [address]   MRSA PCR Screening     Status: Abnormal   Collection Time: 09/07/20 12:38 AM   Specimen: Nasal Mucosa; Nasopharyngeal  Result Value Ref Range Status   MRSA by PCR POSITIVE (A) NEGATIVE Final    Comment:        The GeneXpert MRSA Assay (FDA approved for NASAL specimens only), is one component of a comprehensive MRSA colonization surveillance program. It is not intended to diagnose MRSA infection nor to guide or monitor treatment for MRSA infections. RESULT CALLED TO, READ BACK BY AND VERIFIED WITH: Alicia Amel RN 09/07/20 4008 JDW Performed at [facility], [address], [place], [place] [address]      RN Pressure Injury Documentation:     Estimated body mass index is 51.82 kg/m as calculated from the following:   Height as of this encounter: 5\' 1"  (1.549 m).   Weight as of this encounter: 124.4 kg.  Malnutrition Type:   Malnutrition Characteristics:  Nutrition Interventions:   Radiology Studies: No results found. Scheduled Meds: . Chlorhexidine Gluconate Cloth  6 each Topical Q0600  . mupirocin ointment  1 application Nasal BID  . warfarin  2.5 mg Oral ONCE-1600  . Warfarin - Pharmacist Dosing Inpatient   Does not apply q1600   Continuous Infusions: . sodium chloride Stopped (09/07/20 0535)  . cefTRIAXone (ROCEPHIN)  IV 2 g (09/08/20 2100)  . vancomycin      LOS: 3 days    Kerney Elbe, DO [facility] PAGER is on [place]  If 7PM-7AM, please contact night-coverage [email or web address]

## 2020-09-09 NOTE — Progress Notes (Signed)
ANTICOAGULATION CONSULT NOTE - Follow Up Consult  Pharmacy Consult for Heparin bridge to Warfarin Indication: Tissue AVR and Hx TIA  Patient Measurements: Height: 5\' 1"  (154.9 cm) Weight: 124.4 kg (274 lb 4 oz) IBW/kg (Calculated) : 47.8 Heparin Dosing Weight: 78 kg  Vital Signs: Temp: 98.8 F (37.1 C) (01/01 0721) Temp Source: Oral (01/01 0721) BP: 135/106 (01/01 0721) Pulse Rate: 85 (01/01 0721)  Labs: Recent Labs     0000 09/07/20 0251 09/07/20 0529 09/07/20 1215 09/08/20 0315 09/08/20 1020 09/08/20 1757 09/09/20 0218  HGB   < >  --  12.0  --  11.4*  --   --  12.2  HCT  --   --  38.9  --  37.9  --   --  41.5  PLT  --   --  273  --  303  --   --  268  LABPROT  --  15.5*  --   --  18.4*  --   --  21.6*  INR  --  1.3*  --   --  1.6*  --   --  2.0*  HEPARINUNFRC  --  <0.10*  --    < > 0.24* 0.38 0.45 0.33  CREATININE  --  0.68  --   --  0.64  --   --  0.72  CKTOTAL  --  1,725*  --   --  850*  --   --   --    < > = values in this interval not displayed.    Estimated Creatinine Clearance: 86.8 mL/min (by C-G formula based on SCr of 0.72 mg/dL).  Assessment: 65 yr old female on warfarin PTA (1.25mg  daily except 2.5mg  MWF) for hx tissue AVR + TIA with a low INR on admission and started on a heparin bridge. Pharmacy was consulted for heparin bridge to warfarin until INR is therapeutic.  INR is now therapeutic at 2.0. CBC stable/WNL. No bleeding reported.   Goal of Therapy:  INR goal 2-3 Monitor platelets by anticoagulation protocol: Yes   Plan:  Stop heparin Warfarin 2.5 mg x1 Monitor daily INR and CBC every 72 hours Monitor for signs/symptoms of bleeding  Thank you for allowing pharmacy to be a part of this patient's care.  75, PharmD PGY1 Acute Care Pharmacy Resident Phone: [phone] 09/09/2020 9:28 AM  Please check [email or web address] for unit specific pharmacy phone numbers.

## 2020-09-09 NOTE — Progress Notes (Signed)
Home CPAP unit set up for pt at bedside. RT will check back later.

## 2020-09-09 NOTE — TOC Progression Note (Signed)
Transition of Care [facility]) - Progression Note    Patient Details  Name: [name] MRN: [id number] Date of Birth: 1956/04/26  Transition of Care [facility]) CM/SW Contact  Patrice [place], [place] Phone Number: [phone] 09/09/2020, 10:40 AM  Clinical Narrative:     Patient currently does not have any bed offers.  TOC team will continue to assist with discharge planning needs.  Expected Discharge Plan: Skilled Nursing Facility Barriers to Discharge: Continued Medical Work up  Expected Discharge Plan and Services Expected Discharge Plan: Skilled Nursing Facility       Living arrangements for the past 2 months: Single Family Home                                       Social Determinants of Health (SDOH) Interventions    Readmission Risk Interventions No flowsheet data found.

## 2020-09-09 NOTE — Progress Notes (Signed)
Dear Doctor.  This patient has been identified as a candidate for PICC for the following reason (s): IV therapy over 48 hours and drug pH or osmolality (causing phlebitis, infiltration in 24 hours) If you agree, please write an order for the indicated device. For any questions contact the Vascular Access Team at [phone] if no answer, please leave a message.  Thank you for supporting the early vascular access assessment program.

## 2020-09-09 NOTE — Progress Notes (Signed)
Pharmacy Antibiotic Note  [name] is a 65 y.o. female admitted on 09/05/2020 with cellulitis. Pharmacy has been consulted for vancomycin dosing.  Patient is afebrile, WBC improving at 15.6, Scr stable at 0.72. Patient with hx of itching with vancomycin, tolerating at slower infusion rate and with hydroxyzine and benadryl.   Plan: Decrease vancomycin 1000 mg over 2hr q12 hr Ceftriaxone 2g IV every 24h Monitor cultures, clinical status, renal fx, vanc levels  Narrow abx as able and f/u duration   Height: 5\' 1"  (154.9 cm) Weight: 124.4 kg (274 lb 4 oz) IBW/kg (Calculated) : 47.8  Temp (24hrs), Avg:98.5 F (36.9 C), Min:97.8 F (36.6 C), Max:99.1 F (37.3 C)  Recent Labs  Lab 09/06/20 0012 09/06/20 0214 09/06/20 0528 09/07/20 0251 09/07/20 0529 09/08/20 0315 09/09/20 0218  WBC 23.8*  --  16.7*  --  19.2* 18.9* 15.6*  CREATININE 0.93  --  0.79 0.68  --  0.64 0.72  LATICACIDVEN 1.7 1.6  --   --   --   --   --     Estimated Creatinine Clearance: 86.8 mL/min (by C-G formula based on SCr of 0.72 mg/dL).    Allergies  Allergen Reactions  . Vancomycin Itching    Patient can receive vancomycin with slower infusion and prn benadryl for itching     Antimicrobials this admission: Vanc 12/29  >>  Cefe 12/29>>12/31 Dapto 12/29 CTX 12/29; 12/31>>  Microbiology results: 12/29 BCx: ngtd 12/29 MRSA PCR: pos  Thank you for allowing pharmacy to be a part of this patient's care.  1/30, PharmD PGY1 Acute Care Pharmacy Resident Phone: [phone] 09/09/2020 9:50 AM  Please check [email or web address] for unit specific pharmacy phone numbers.

## 2020-09-09 NOTE — Evaluation (Signed)
Occupational Therapy Evaluation Patient Details Name: [name] MRN: [id number] DOB: 1956/02/01 Today's Date: 09/09/2020    History of Present Illness [name] is a 65 y.o. female with medical history significant of dCHF, chronic lymphedema of BLE L>R, CAD, AS s/p AVR with bioprosthetic, TIA, on chronic coumadin with goal INR 2-3.   Pt presents to the ED with worsening LLE erythema.  Onset 6 days ago, progressively worsening, now severe.  Nothing tried for symptoms, nothing makes better or worse. She was planning on coming to ER for symptoms, but had a slip off of bed earlier today, and got stuck between bed and wall for 4 hours.  RIGHT leg was stuck between bed and wall.  Now has RIGHT knee pain and erythema and edema.   Clinical Impression   Patient admitted for the diagnosis above.  PTA she needed min A for lower body ADL, walked without assistive device, participated with meal prep and home management, took care of her meds and drove.  Currently, due to deficits listed below, she is needing mod to max assist with ADLs and +2 for mobility.  Skilled OT to follow n the acute setting to maximize functional status to determine home with [facility] versus SNF.  Currently SNF is recommended.      Follow Up Recommendations  SNF    Equipment Recommendations  3 in 1 bedside commode    Recommendations for Other Services       Precautions / Restrictions Precautions Precautions: Fall Precaution Comments: open and blistered areas to B LE's Restrictions Weight Bearing Restrictions: No      Mobility Bed Mobility Overal bed mobility: Needs Assistance Bed Mobility: Sit to Supine;Supine to Sit;Rolling Rolling: Min assist   Supine to sit: Min assist Sit to supine: Mod assist        Transfers Overall transfer level: Needs assistance Equipment used: Rolling walker (2 wheeled) Transfers: Sit to/from [facility] Sit to Stand: Mod assist;+2 physical assistance;+2  safety/equipment;From elevated surface Stand pivot transfers: Mod assist;+2 physical assistance;+2 safety/equipment            Balance     Sitting balance-Leahy Scale: Fair     Standing balance support: Bilateral upper extremity supported;During functional activity Standing balance-Leahy Scale: Poor                             ADL either performed or assessed with clinical judgement   ADL Overall ADL's : Needs assistance/impaired Eating/Feeding: Independent;Bed level   Grooming: Wash/dry hands;Wash/dry face;Bed level   Upper Body Bathing: Minimal assistance;Sitting   Lower Body Bathing: Maximal assistance;Sitting/lateral leans   Upper Body Dressing : Minimal assistance;Sitting   Lower Body Dressing: Maximal assistance;Sitting/lateral leans   Toilet Transfer: Moderate assistance;+2 for physical assistance;RW   Toileting- Clothing Manipulation and Hygiene: Total assistance       Functional mobility during ADLs: Moderate assistance;+2 for physical assistance;Rolling walker       Vision Baseline Vision/History: Wears glasses Wears Glasses: At all times Patient Visual Report: No change from baseline       Perception     Praxis      Pertinent Vitals/Pain Pain Assessment: Faces Faces Pain Scale: Hurts little more Pain Location: R LE Pain Descriptors / Indicators: Sore;Tingling Pain Intervention(s): Monitored during session     Hand Dominance Right   Extremity/Trunk Assessment Upper Extremity Assessment Upper Extremity Assessment: Generalized weakness   Lower Extremity Assessment Lower Extremity Assessment: Defer  to PT evaluation   Cervical / Trunk Assessment Cervical / Trunk Assessment: Normal   Communication Communication Communication: No difficulties   Cognition Arousal/Alertness: Awake/alert Behavior During Therapy: WFL for tasks assessed/performed Overall Cognitive Status: Within Functional Limits for tasks assessed                                      General Comments       Exercises     Shoulder Instructions      Home Living Family/patient expects to be discharged to:: Private residence Living Arrangements: Spouse/significant other Available Help at Discharge: Family;Available 24 hours/day Type of Home: House Home Access: Stairs to enter [employer] of Steps: 1 Entrance Stairs-Rails: None Home Layout: One level     Bathroom Shower/Tub: Teacher, early years/pre: Standard     Home Equipment: Environmental consultant - 4 wheels;Tub bench;Hand held shower head          Prior Functioning/Environment Level of Independence: Needs assistance  Gait / Transfers Assistance Needed: no AD at home ADL's / Homemaking Assistance Needed: spouse assists with socks and washes feet and back Communication / Swallowing Assistance Needed: WFLs          OT Problem List: Decreased strength;Decreased activity tolerance;Impaired balance (sitting and/or standing);Obesity;Increased edema;Pain      OT Treatment/Interventions: Self-care/ADL training;Therapeutic exercise;Balance training;Therapeutic activities    OT Goals(Current goals can be found in the care plan section) Acute Rehab OT Goals Patient Stated Goal: home if I can walk by myself OT Goal Formulation: With patient Potential to Achieve Goals: Good ADL Goals Pt Will Perform Grooming: with set-up;sitting;standing Pt Will Perform Upper Body Bathing: with set-up;sitting;standing Pt Will Perform Lower Body Bathing: with min assist;sit to/from stand Pt Will Perform Upper Body Dressing: with set-up;sitting;standing Pt Will Perform Lower Body Dressing: with min assist;sit to/from stand Pt Will Transfer to Toilet: with modified independence;regular height toilet;ambulating Pt Will Perform Toileting - Clothing Manipulation and hygiene: with modified independence;sit to/from stand  OT Frequency: Min 2X/week   Barriers to D/C:    none        Co-evaluation              AM-PAC OT "6 Clicks" Daily Activity     Outcome Measure Help from another person eating meals?: None Help from another person taking care of personal grooming?: A Little Help from another person toileting, which includes using toliet, bedpan, or urinal?: A Lot Help from another person bathing (including washing, rinsing, drying)?: A Lot Help from another person to put on and taking off regular upper body clothing?: A Little Help from another person to put on and taking off regular lower body clothing?: A Lot 6 Click Score: 16   End of Session Equipment Utilized During Treatment: Gait belt;Rolling walker Nurse Communication: Mobility status  Activity Tolerance: Patient tolerated treatment well Patient left: in bed;with call bell/phone within reach;with bed alarm set  OT Visit Diagnosis: Unsteadiness on feet (R26.81);History of falling (Z91.81);Muscle weakness (generalized) (M62.81);Pain Pain - Right/Left: Left Pain - part of body: Leg                Time: [id number] OT Time Calculation (min): 23 min Charges:  OT General Charges $OT Visit: 1 Visit OT Evaluation $OT Eval Moderate Complexity: 1 Mod OT Treatments $Self Care/Home Management : 8-22 mins  09/09/2020  [name], OTR/L  Acute Rehabilitation Services  Office:  [phone]  Future Yeldell D Tayah Idrovo 09/09/2020, 8:38 AM

## 2020-09-10 ENCOUNTER — Inpatient Hospital Stay (HOSPITAL_COMMUNITY): Payer: Medicare Other

## 2020-09-10 DIAGNOSIS — R600 Localized edema: Secondary | ICD-10-CM | POA: Diagnosis not present

## 2020-09-10 DIAGNOSIS — I5032 Chronic diastolic (congestive) heart failure: Secondary | ICD-10-CM | POA: Diagnosis not present

## 2020-09-10 DIAGNOSIS — A419 Sepsis, unspecified organism: Secondary | ICD-10-CM | POA: Diagnosis not present

## 2020-09-10 DIAGNOSIS — L0390 Cellulitis, unspecified: Secondary | ICD-10-CM | POA: Diagnosis not present

## 2020-09-10 LAB — COMPREHENSIVE METABOLIC PANEL
ALT: 40 U/L (ref 0–44)
AST: 40 U/L (ref 15–41)
Albumin: 2 g/dL — ABNORMAL LOW (ref 3.5–5.0)
Alkaline Phosphatase: 56 U/L (ref 38–126)
Anion gap: 9 (ref 5–15)
BUN: 8 mg/dL (ref 8–23)
CO2: 24 mmol/L (ref 22–32)
Calcium: 9.1 mg/dL (ref 8.9–10.3)
Chloride: 102 mmol/L (ref 98–111)
Creatinine, Ser: 0.66 mg/dL (ref 0.44–1.00)
GFR, Estimated: >60 mL/min (ref >60)
Glucose, Bld: 115 mg/dL — ABNORMAL HIGH (ref 70–99)
Potassium: 4.2 mmol/L (ref 3.5–5.1)
Sodium: 135 mmol/L (ref 135–145)
Total Bilirubin: 0.3 mg/dL (ref 0.3–1.2)
Total Protein: 7.3 g/dL (ref 6.5–8.1)

## 2020-09-10 LAB — VITAMIN B12: Vitamin B-12: 318 pg/mL (ref 180–914)

## 2020-09-10 LAB — CBC WITH DIFFERENTIAL/PLATELET
Abs Immature Granulocytes: 0.71 10*3/uL — ABNORMAL HIGH (ref 0.00–0.07)
Basophils Absolute: 0.1 10*3/uL (ref 0.0–0.1)
Basophils Relative: 1 %
Eosinophils Absolute: 0.9 10*3/uL — ABNORMAL HIGH (ref 0.0–0.5)
Eosinophils Relative: 6 %
HCT: 37.6 % (ref 36.0–46.0)
Hemoglobin: 11.9 g/dL — ABNORMAL LOW (ref 12.0–15.0)
Immature Granulocytes: 5 %
Lymphocytes Relative: 12 %
Lymphs Abs: 1.7 10*3/uL (ref 0.7–4.0)
MCH: 27.7 pg (ref 26.0–34.0)
MCHC: 31.6 g/dL (ref 30.0–36.0)
MCV: 87.4 fL (ref 80.0–100.0)
Monocytes Absolute: 0.7 10*3/uL (ref 0.1–1.0)
Monocytes Relative: 5 %
Neutro Abs: 10.7 10*3/uL — ABNORMAL HIGH (ref 1.7–7.7)
Neutrophils Relative %: 71 %
Platelets: 366 10*3/uL (ref 150–400)
RBC: 4.3 MIL/uL (ref 3.87–5.11)
RDW: 16.7 % — ABNORMAL HIGH (ref 11.5–15.5)
WBC: 14.8 10*3/uL — ABNORMAL HIGH (ref 4.0–10.5)
nRBC: 0 % (ref 0.0–0.2)

## 2020-09-10 LAB — PROTIME-INR
INR: 2.2 — ABNORMAL HIGH (ref 0.8–1.2)
Prothrombin Time: 24 seconds — ABNORMAL HIGH (ref 11.4–15.2)

## 2020-09-10 LAB — RETICULOCYTES
Immature Retic Fract: 29.5 % — ABNORMAL HIGH (ref 2.3–15.9)
RBC.: 4.46 MIL/uL (ref 3.87–5.11)
Retic Count, Absolute: 78.1 10*3/uL (ref 19.0–186.0)
Retic Ct Pct: 1.8 % (ref 0.4–3.1)

## 2020-09-10 LAB — FERRITIN: Ferritin: 291 ng/mL (ref 11–307)

## 2020-09-10 LAB — PHOSPHORUS: Phosphorus: 3.8 mg/dL (ref 2.5–4.6)

## 2020-09-10 LAB — MAGNESIUM: Magnesium: 2.2 mg/dL (ref 1.7–2.4)

## 2020-09-10 LAB — CK: Total CK: 154 U/L (ref 38–234)

## 2020-09-10 LAB — IRON AND TIBC
Iron: 39 ug/dL (ref 28–170)
Saturation Ratios: 14 % (ref 10.4–31.8)
TIBC: 272 ug/dL (ref 250–450)
UIBC: 233 ug/dL

## 2020-09-10 LAB — FOLATE: Folate: 7.6 ng/mL (ref >5.9)

## 2020-09-10 IMAGING — DX DG ANKLE COMPLETE 3+V*R*
1 series · 3 of 3 positions shown · non-contrast
Comparison: None.

CLINICAL DATA: Right lateral ankle pain.

EXAM:
RIGHT ANKLE - COMPLETE 3+ VIEW

[Series 1: ankle · 0.14mm/px · 3 of 3 slices shown]
[im 1/3]
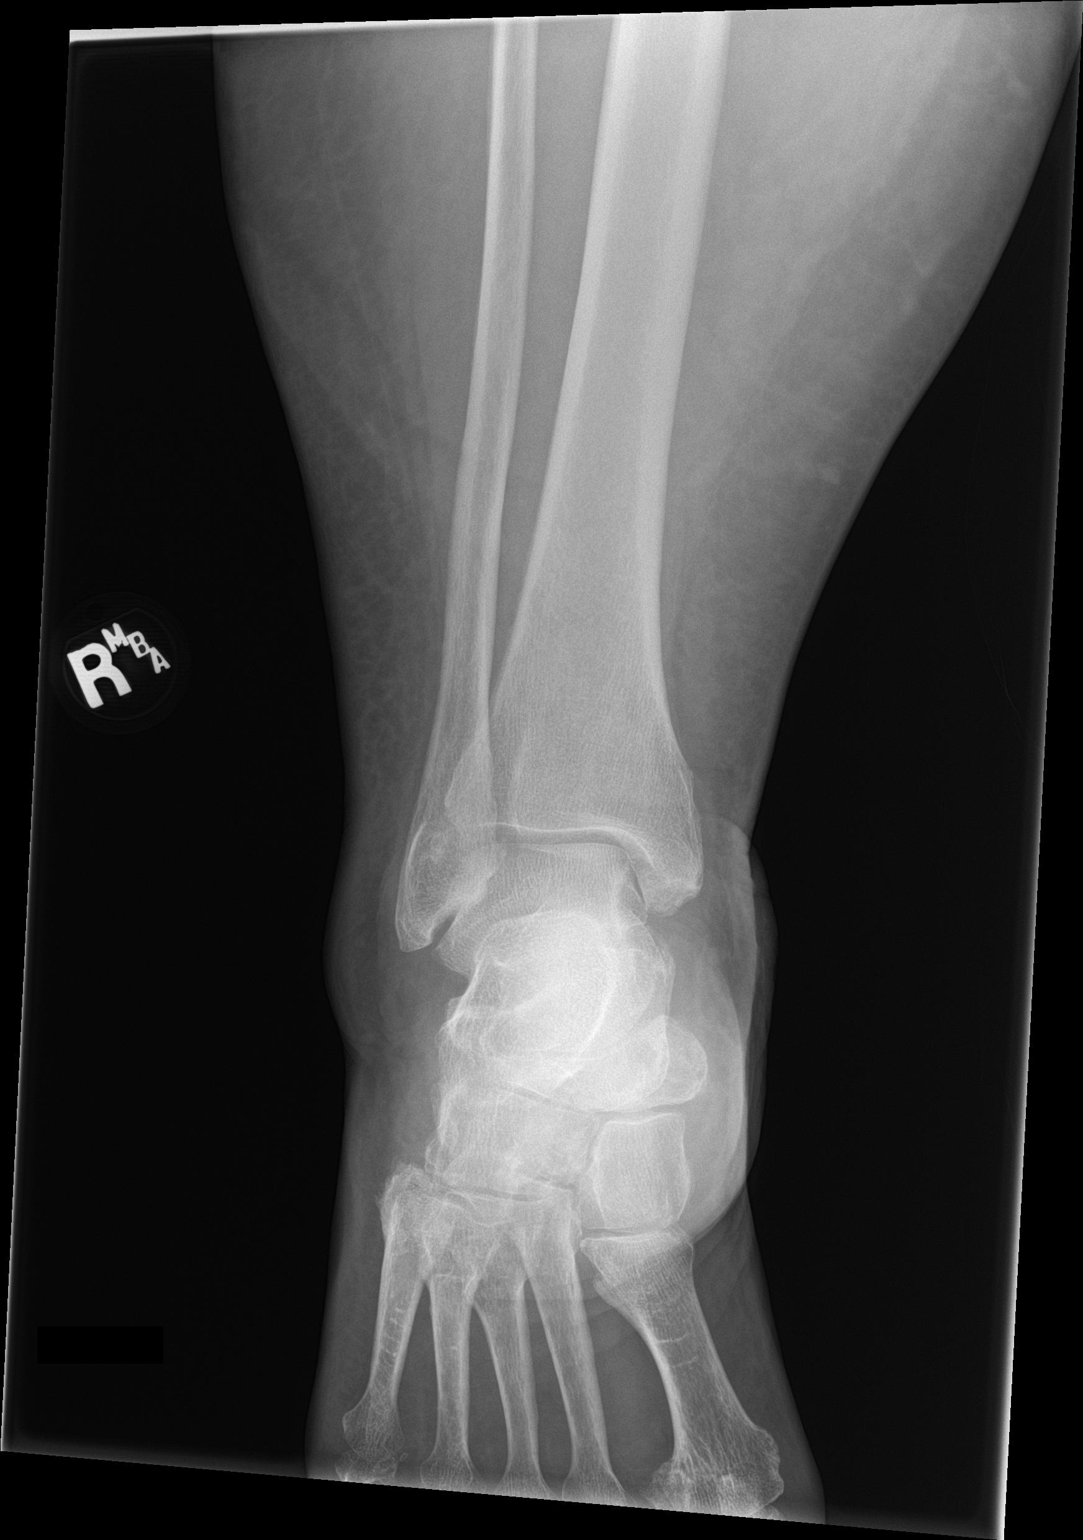
[im 2/3]
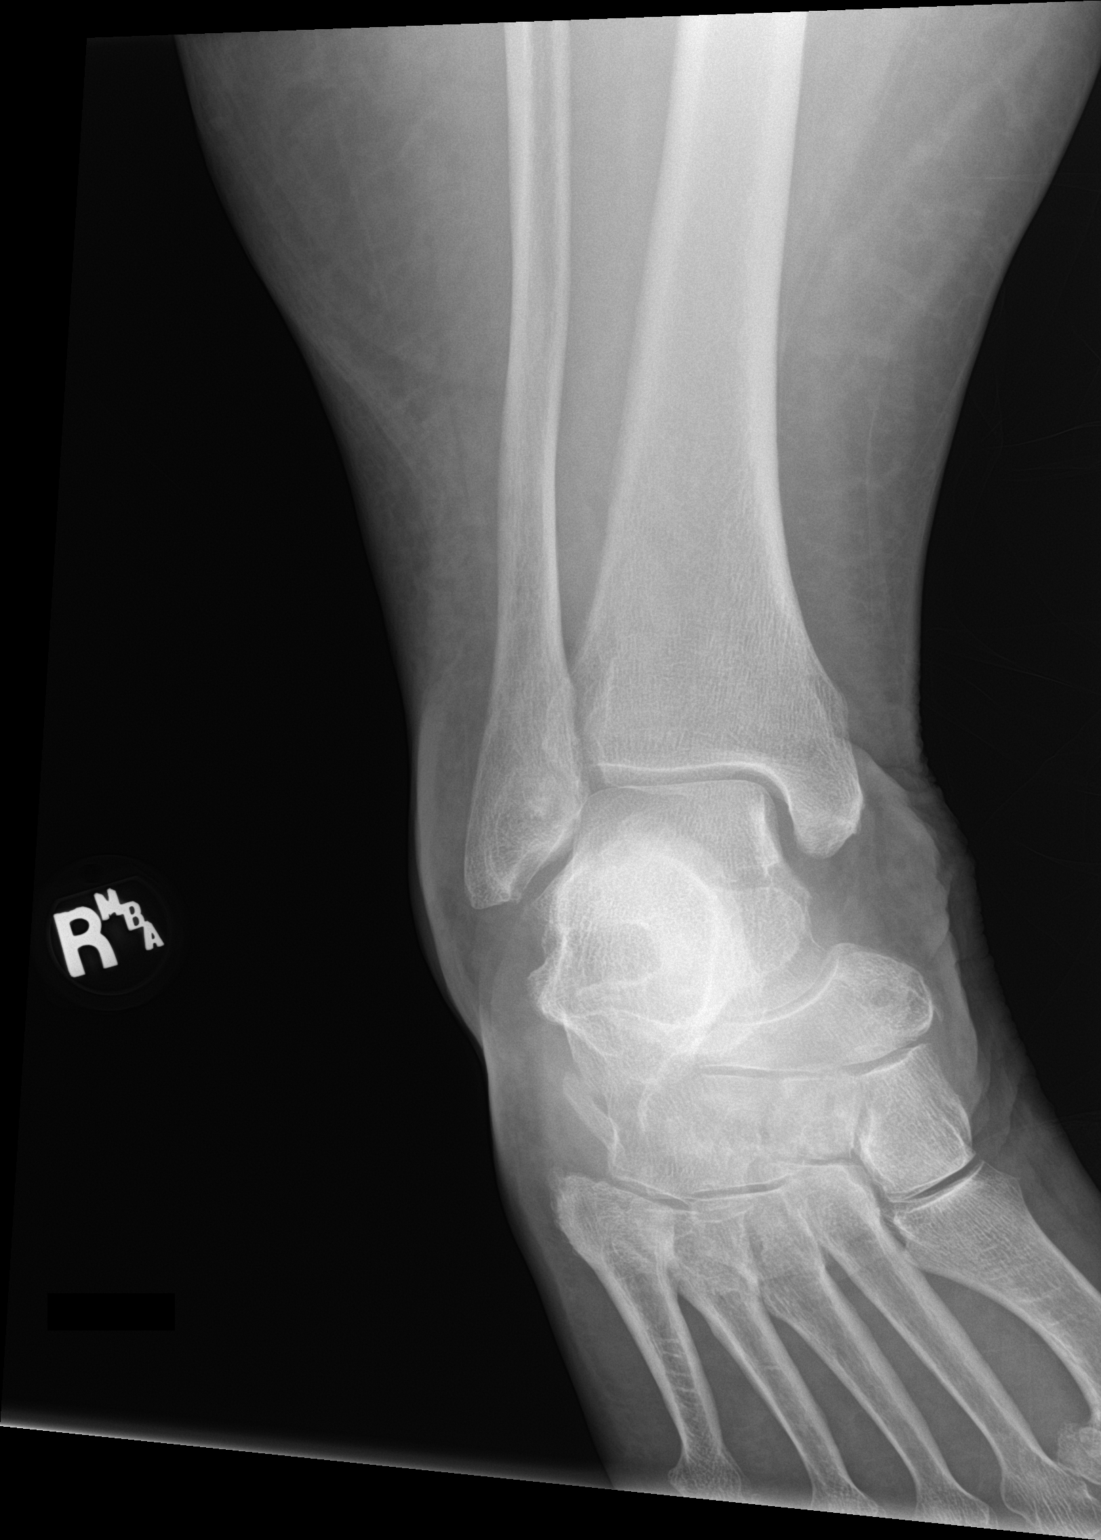
[im 3/3]
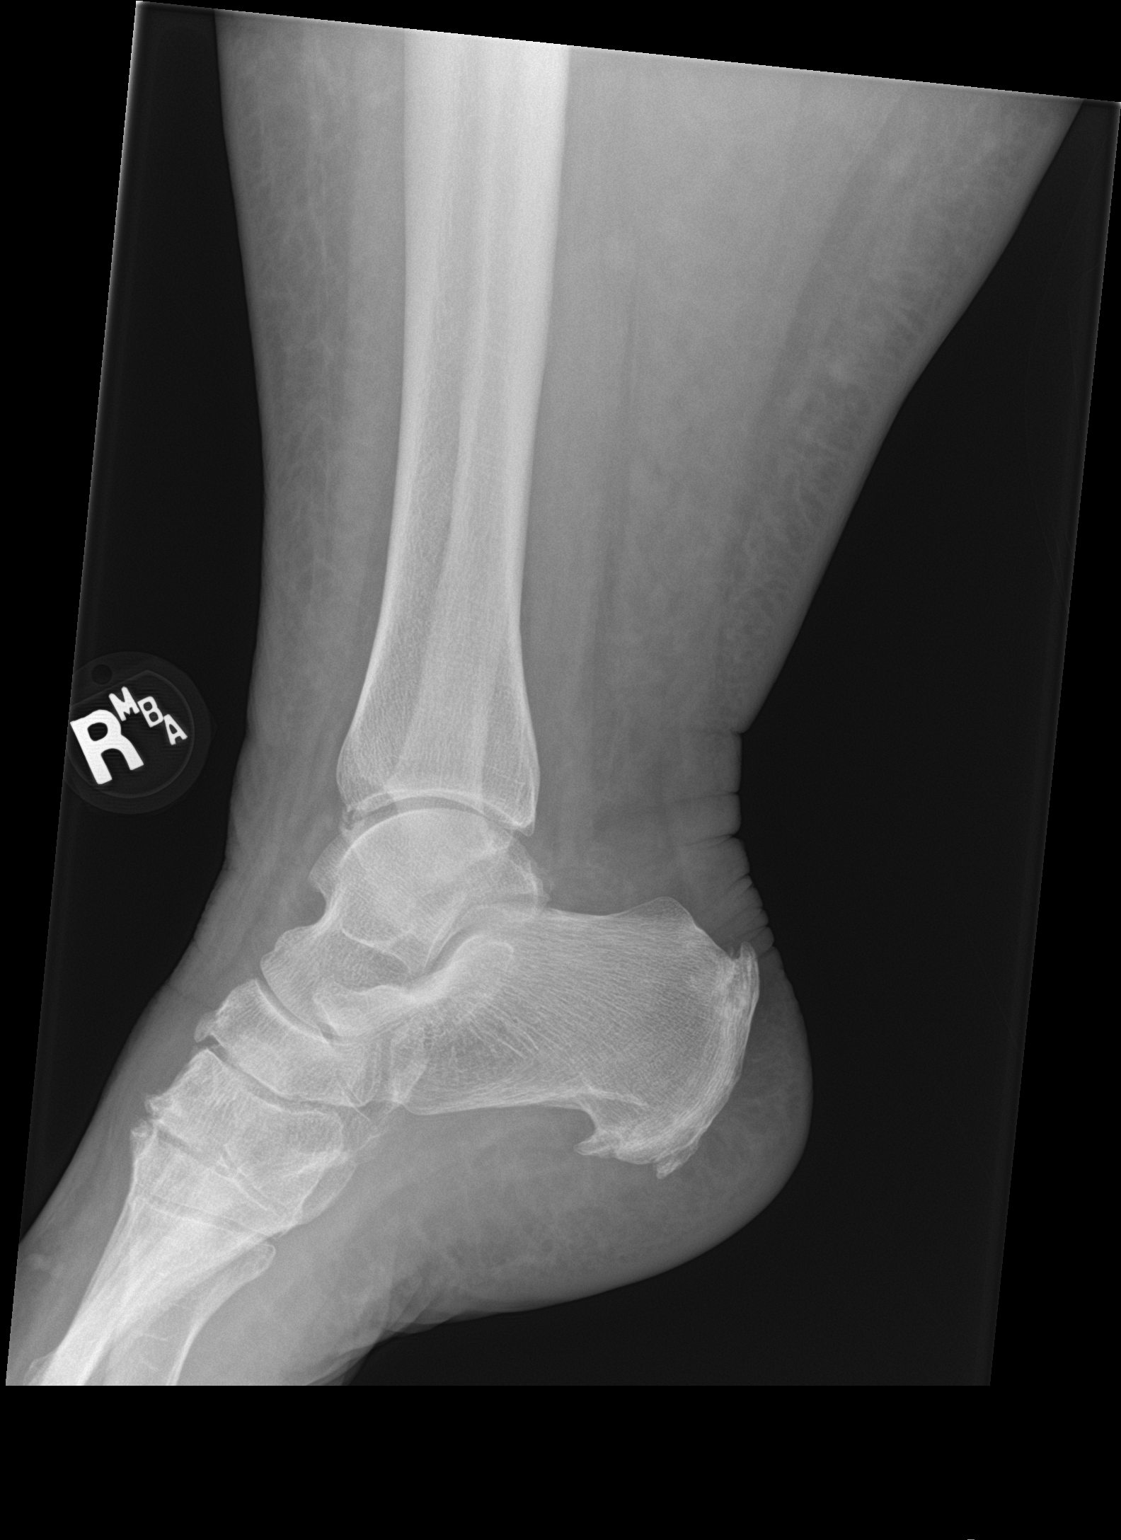

[3 of 3 positions shown; findings below may reference images not displayed]

FINDINGS: Lateral soft tissue swelling. No acute bony abnormality.
Specifically, no fracture, subluxation, or dislocation. Plantar and
posterior calcaneal spurs. Degenerative changes in the tarsal
region.
IMPRESSION: No acute bony abnormality.

## 2020-09-10 MED ORDER — MIRABEGRON ER 25 MG PO TB24
25.0000 mg | ORAL_TABLET | Freq: Every day | ORAL | Status: DC
  Administered 2020-09-10 – 2020-09-11 (×2): 25 mg via ORAL
  Filled 2020-09-10 (×3): qty 1

## 2020-09-10 MED ORDER — JUVEN PO PACK
1.0000 | PACK | Freq: Two times a day (BID) | ORAL | Status: DC
  Administered 2020-09-10 – 2020-09-12 (×4): 1 via ORAL
  Filled 2020-09-10 (×4): qty 1

## 2020-09-10 MED ORDER — FERROUS SULFATE 325 (65 FE) MG PO TABS
324.0000 mg | ORAL_TABLET | Freq: Every day | ORAL | Status: DC
  Filled 2020-09-10: qty 1

## 2020-09-10 MED ORDER — METOPROLOL SUCCINATE ER 50 MG PO TB24
50.0000 mg | ORAL_TABLET | Freq: Every morning | ORAL | Status: DC
  Administered 2020-09-10 – 2020-09-12 (×3): 50 mg via ORAL
  Filled 2020-09-10 (×3): qty 1

## 2020-09-10 MED ORDER — DOXYCYCLINE HYCLATE 100 MG PO TABS
100.0000 mg | ORAL_TABLET | Freq: Two times a day (BID) | ORAL | Status: DC
  Administered 2020-09-10 – 2020-09-12 (×5): 100 mg via ORAL
  Filled 2020-09-10 (×5): qty 1

## 2020-09-10 MED ORDER — ADULT MULTIVITAMIN W/MINERALS CH
1.0000 | ORAL_TABLET | Freq: Every day | ORAL | Status: DC
  Administered 2020-09-10 – 2020-09-12 (×3): 1 via ORAL
  Filled 2020-09-10 (×3): qty 1

## 2020-09-10 MED ORDER — WARFARIN 1.25 MG HALF TABLET
1.2500 mg | ORAL_TABLET | Freq: Once | ORAL | Status: AC
  Administered 2020-09-10: 1.25 mg via ORAL
  Filled 2020-09-10: qty 1

## 2020-09-10 MED ORDER — ENSURE MAX PROTEIN PO LIQD
11.0000 [oz_av] | Freq: Two times a day (BID) | ORAL | Status: DC
  Administered 2020-09-10 – 2020-09-12 (×4): 11 [oz_av] via ORAL
  Filled 2020-09-10 (×6): qty 330

## 2020-09-10 NOTE — TOC Progression Note (Signed)
Transition of Care [facility]) - Progression Note    Patient Details  Name: [name] MRN: [id number] Date of Birth: February 28, 1956  [facility] CM/SW Contact  Patrice [place], [place] Phone Number: [phone] 09/10/2020, 1:47 PM  Clinical Narrative:     Patient currently does not have any bed offers.  TOC team will continue to assist with discharge planning needs.     Expected Discharge Plan: Skilled Nursing Facility Barriers to Discharge: Continued Medical Work up  Expected Discharge Plan and Services Expected Discharge Plan: Skilled Nursing Facility       Living arrangements for the past 2 months: Single Family Home                                       Social Determinants of Health (SDOH) Interventions    Readmission Risk Interventions No flowsheet data found.

## 2020-09-10 NOTE — Progress Notes (Signed)
Pt has BIPAP machine at bedside and does not need any assistance.

## 2020-09-10 NOTE — Progress Notes (Signed)
Initial Nutrition Assessment  RD working remotely.  DOCUMENTATION CODES:   Morbid obesity  INTERVENTION:   - Ensure Max po BID, each supplement provides 150 kcal and 30 grams of protein  - 1 packet Juven BID, each packet provides 95 calories, 2.5 grams of protein, and 9.8 grams of carbohydrate; also contains 7 grams of L-arginine and L-glutamine, 300 mg vitamin C, 15 mg vitamin E, 1.2 mcg vitamin B-12, 9.5 mg zinc, 200 mg calcium, and 1.5 g  Calcium Beta-hydroxy-Beta-methylbutyrate to support wound healing  - MVI with minerals daily  - Will follow up for ability to provide high-protein diet education regarding wound healing.  NUTRITION DIAGNOSIS:   Increased nutrient needs related to wound healing,chronic illness (CHF) as evidenced by estimated needs.  GOAL:   Patient will meet greater than or equal to 90% of their needs  MONITOR:   PO intake,Supplement acceptance,Labs,Weight trends,I & O's,Skin  REASON FOR ASSESSMENT:   Consult Diet education,Wound healing  ASSESSMENT:   65 year old female who presented to the ED on 12/28 after a fall at home. PMH of CHF, chronic lymphedema, CAD, AS s/p AVR with bioprosthetic, TIA. Pt admitted with mild rhabdomyolysis, sepsis due to LLE cellulitis.   Per notes, sepsis due to LLE cellulitis is improving. Plan is for pt to d/c to SNF. However, no bed offers at this time.  Pt currently on a heart healthy/carb modified diet. Meal completions charted as 50-100% since admission.  RD was unable to reach pt via phone call to room. Given increased nutrient needs related to wound healing, RD to order oral nutrition supplements and daily MVI.  Will follow up with pt regarding diet education for wound healing.  Reviewed weight history in chart. Weight down 3.3 kg compared to weight from 03/28/20. Pt's weight has fluctuated between 120-126 kg during this admission and between 120-128 kg over the last year. Suspect weight fluctuations related to  fluid status given CHF diagnosis. Per RN edema assessment, pt with mild pitting generalized edema and moderate pitting edema to BLE.  Medications reviewed and include: warfarin, IV abx  Labs reviewed: hemoglobin A1C 6.1  UOP: 1550 ml x 24 hours I/O's: +3.9 L since admit  NUTRITION - FOCUSED PHYSICAL EXAM:  Unable to complete at this time. RD working remotely.  Diet Order:   Diet Order            Diet heart healthy/carb modified Room service appropriate? Yes; Fluid consistency: Thin  Diet effective now                 EDUCATION NEEDS:   Not appropriate for education at this time  Skin:  Skin Assessment: Skin Integrity Issues: Other: venous stasis ulcer left leg, cellulitis BLE  Last BM:  09/09/20  Height:   Ht Readings from Last 1 Encounters:  09/06/20 5\' 1"  (1.549 m)    Weight:   Wt Readings from Last 1 Encounters:  09/10/20 123.3 kg    BMI:  Body mass index is 51.36 kg/m.  Estimated Nutritional Needs:   Kcal:  2000-2200  Protein:  100-120 grams  Fluid:  2.0 L/day    11/08/20, MS, RD, LDN Inpatient Clinical Dietitian Please see AMiON for contact information.

## 2020-09-10 NOTE — Progress Notes (Addendum)
ANTICOAGULATION CONSULT NOTE - Follow Up Consult  Pharmacy Consult for Heparin bridge to Warfarin Indication: Tissue AVR and Hx TIA  Patient Measurements: Height: 5\' 1"  (154.9 cm) Weight: 123.3 kg (271 lb 13.2 oz) IBW/kg (Calculated) : 47.8 Heparin Dosing Weight: 78 kg  Vital Signs: Temp: 98.6 F (37 C) (01/02 0423) Temp Source: Oral (01/02 0423) BP: 119/62 (01/02 0423) Pulse Rate: 80 (01/02 0423)  Labs: Recent Labs    09/08/20 0315 09/08/20 1020 09/08/20 1757 09/09/20 0218 09/10/20 0416  HGB 11.4*  --   --  12.2 11.9*  HCT 37.9  --   --  41.5 37.6  PLT 303  --   --  268 366  LABPROT 18.4*  --   --  21.6* 24.0*  INR 1.6*  --   --  2.0* 2.2*  HEPARINUNFRC 0.24* 0.38 0.45 0.33  --   CREATININE 0.64  --   --  0.72 0.66  CKTOTAL 850*  --   --  463* 154    Estimated Creatinine Clearance: 86.3 mL/min (by C-G formula based on SCr of 0.66 mg/dL).  Assessment: 65 yr old female on warfarin PTA (1.25mg  daily except 2.5mg  MWF) for hx tissue AVR + TIA with a low INR on admission and started on a heparin bridge. Pharmacy was consulted for heparin bridge to warfarin until INR is therapeutic.  INR continues to be therapeutic at 2.2. CBC stable/WNL. No bleeding reported. Consider resuming home warfarin regimen.   Addendum: changing antibiotics to doxycycline. Monitor INR.   Goal of Therapy:  INR goal 2-3 Monitor platelets by anticoagulation protocol: Yes   Plan:  Warfarin 1.25 mg x1 Monitor daily INR and CBC every 72 hours Monitor for signs/symptoms of bleeding  Thank you for allowing pharmacy to be a part of this patient's care.  75, PharmD PGY1 Acute Care Pharmacy Resident Phone: [phone] 09/10/2020 8:00 AM  Please check [email or web address] for unit specific pharmacy phone numbers.

## 2020-09-10 NOTE — Progress Notes (Signed)
PROGRESS NOTE    [name]  [id number] DOB: 11-19-1955 DOA: 09/05/2020 PCP: [name], MD   Brief Narrative:  The patient is a super morbidly obese Caucasian female with a past medical history significant for but not limited to chronic lymphedema of the bilateral lower extremities with left being greater than right, CAD, history of aortic stenosis status post AVR with bioprosthetic valve on chronic anticoagulation with Coumadin with a goal INR 2-3, history of TIA, as well as other comorbidities who presented after a fall and was found to have worsening left lower extremity erythema.  Patient states the onset was about a week ago and progressively gotten worse and was severe.  She slipped off the bed and got stuck between the bed and the wall for approximately hours.  Now complains of right knee and hip pain.  Left leg was worse with erythema and edema.  Please see the pictures and Dr. Juleen China note.  **Interim History Sepsis physiology is improved and we have changed her daptomycin to vancomycin.  She is tolerating her vancomycin just fine.  WBC had initially improved now slightly worsened.  We will repeat her procalcitonin level in the a.m.  Patient thinks that her leg looks a little bit better but I have asked the nurse to mark the skin today.  We will continue to monitor and if she is not improving will obtain imaging of her leg and ID consultation.  She will need to have a PT OT consultation as well.  Rhabdomyolysis is improving.  INR remained subtherapeutic and she remained on a heparin bridge and it is now INR is 2.2 today.  Will need to continue to monitor for signs and symptoms of bleeding.  Repeat CK this morning is 154.  WBC is slightly improved to 14.8.  Continuing IV vancomycin but will deescalate IV cefepime to IV Ceftriaxone and may discuss with infectious diseases for further recommendations on transitioning.  PT OT evaluated and recommending skilled nursing facility.   Her numbers are improving and she is agreeable to SNF but currently has no bed offers.  Assessment & Plan:   Principal Problem:   Sepsis due to cellulitis [facility]) Active Problems:   Edema of both legs   H/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   Morbid obesity (HCC)   Chronic diastolic CHF (congestive heart failure) (HCC)  Sepsis due to LLE cellulitis, this is improving -Sepsis was present on admission; she was documented to have a tachypnea with a respiratory rate of 22, tachycardia with a pulse rate of 107, and a leukocytosis of 23.8 -Was continued on IV Cefepime but will de-escalate this to IV Ceftriaxone now, placed on daptomycin in the ED this was changed to IV vancomycin.  Now will de-escalate further to p.o. on displacer on monotherapy with doxycycline 100 mg p.o. twice daily and will continue for total of 7 to 10 days; it appears that this is day 5 of therapy so we will continue for 5 more days oral antibiotics -Patient had some minor itching today so will start Hydroxyzine 25 mg po TIDprn -MRSA PCR nares was positive -BCx pending -PCT was 2.10; WBC was 23.8 and is improved to 14.8 today -Wound care consult for leg -No subq emphysema, dont think she has Jim Desanctis. -I have asked the nurse to mark her skin view response to antibiotics -We will obtain PT OT evaluation they are recommending skilled nursing facility and patient is agreeable to this; [facility] consulted for assistance with  placement but currently she has no bed offers  Right knee pain -Improving -Had a CT scan of her knee which showed "No acute fracture or dislocation. Edema around the medial patellar retinaculum with slight lateral subluxation of the patella. Probable small loose body in the region of the posterior medial meniscus." -Continue to monitor and PT OT recommending SNF when her knee pain is getting better  Right Ankle Weakness -States that she starts getting paresthesias when she tries to put weight on her  ankle. -We will start with a DG x-ray of the ankle andf it showed "Lateral soft tissue swelling. No acute bony abnormality. Specifically, no fracture, subluxation, or dislocation. Plantar and posterior calcaneal spurs. Degenerative changes in the tarsal region." -C/w PT/OT; May need ankle brace if has Laxity   Right Hip Pain -DG X-Ray showed "Degenerative changes in the right hip with joint space narrowing and osteophyte formation on both sides of the hip. Heterotopic ossification over the greater trochanter. No evidence of acute fracture or dislocation. No focal bone lesions. Soft tissues are unremarkable."  Rhabdomyolysis -CPK went from 3900 -> 4082 -> 1725 -> 850 -> 463 -> 154 -UA pending, urine looks very dark -IVF: giving another 1L bolus now (1.25L total bolus volume) and 150 cc/hr LR has now been discontinued and now her heparin drip is stopped -We will not repeat CPK in the a.m. as it is normalized. -Strict intake and output; encourage oral intake  Abnormal LFTs, improved -In the setting of rhabdomyolysis and were initially improving.  AST went from 85 -> 66 -> 60 -> 40 -ALT has now gone from 41 -> 43 -> 45 -> 40 -Continue to monitor and trend and repeat CMP in a.m. -if necessary will obtain a right upper quadrant ultrasound and a acute hepatitis panel but will hold off for now -Holding Pravastatin 40 mg po daily   Chronic dCHF  History of Aortic stenosis status post AVR bioprosthetic valve -Holding diuretics with Torsemide and will resume in AM -Was Holding Metoprolol but will resume now  -Coumadin per pharm consult, on coumadin chronically for stroke prevention, currently INR 2.2, goal INR 2-3 per patient.   We will continue to bridge the patient with heparin drip and Coumadin until her INR is therapeutic and her heparin drip is now stopped yesterday -IV fluid hydration is now stopped; She is +3.9865 Liters -Continue monitor for signs and symptoms of volume overload  -Strict  I's and O's and daily weights -C/w Heparin bridge for the moment and transition off in the next few days  Hyponatremia -Mild and improving -Na+ went from 129 -> 131 and is 133 -> 135 again today -IVF Hydration stopped -Continue to Monitor and Trend  Super Morbid Obesity -Complicates overall prognosis and care -Estimated body mass index is 51.36 kg/m as calculated from the following:   Height as of this encounter: 5\' 1"  (1.549 m).   Weight as of this encounter: 123.3 kg. -Weight loss and dietary counseling given  Hyperglycemia in a patient with a history of diabetes mellitus type 2 -Checked Hemoglobin A1c is now 6.1; last hemoglobin A1c was 7.0 a year ago -Continue to monitor blood sugars carefully and if necessary will place on sensitive NovoLog/scale insulin -Blood sugars have been ranging from 87-119 on daily BMPs/CMP; Repeat this AM was 115 -Nutritionist consulted for diet education  Normocytic Anemia -Patient's hemoglobin/hematocrit went from 50.9/51.9 -> 12.0/38.9 -> 11.4/37.9 -> 12.2/41.5 -> 11.9/37.6 -Anemia Panel this AM showed an iron level of 39, U  IBC 233, TIBC of 272, saturation ratios of 14%, ferritin level of 291, folate level 7.6, vitamin B12 level of 318 -Continue to monitor for signs and symptoms of bleeding as she is anticoagulated on a heparin drip as well as Coumadin; currently no overt bleeding noted -Repeat CBC in the a.m.  DVT prophylaxis: Anticoagulated with a heparin drip as well as Coumadin given Subtherapeutic INR Code Status: FULL CODE Family Communication: No family present at bedside  Disposition Plan: SNF at discharge; she is improving from a cellulitis standpoint and is close to having a therapeutic INR  Status is: Inpatient  Remains inpatient appropriate because:Unsafe d/c plan, IV treatments appropriate due to intensity of illness or inability to take PO and Inpatient level of care appropriate due to severity of illness   Dispo: The patient is  from: Home              Anticipated d/c is to: SNF              Anticipated d/c date is: 1-2 days              Patient currently is not medically stable to d/c.  Consultants:   None but discussed transition of Vanc/Ceftriaxone with Dr. Johnnye Sima    Procedures: None  Antimicrobials:  Anti-infectives (From admission, onward)   Start     Dose/Rate Route Frequency Ordered Stop   09/10/20 2215  vancomycin (VANCOCIN) IVPB 1000 mg/200 mL premix  Status:  Discontinued        1,000 mg 100 mL/hr over 120 Minutes Intravenous Every 12 hours 09/09/20 2108 09/09/20 2250   09/10/20 1000  vancomycin (VANCOCIN) IVPB 1000 mg/200 mL premix        1,000 mg 100 mL/hr over 120 Minutes Intravenous Every 12 hours 09/09/20 2250     09/09/20 2130  cefTRIAXone (ROCEPHIN) 2 g in sodium chloride 0.9 % 100 mL IVPB        2 g 200 mL/hr over 30 Minutes Intravenous Every 24 hours 09/09/20 2108     09/09/20 1800  vancomycin (VANCOCIN) IVPB 1000 mg/200 mL premix  Status:  Discontinued        1,000 mg 100 mL/hr over 120 Minutes Intravenous Every 12 hours 09/09/20 0952 09/09/20 2108   09/08/20 2000  cefTRIAXone (ROCEPHIN) 2 g in sodium chloride 0.9 % 100 mL IVPB  Status:  Discontinued        2 g 200 mL/hr over 30 Minutes Intravenous Every 24 hours 09/08/20 1451 09/09/20 2108   09/07/20 0600  vancomycin (VANCOREADY) IVPB 1250 mg/250 mL  Status:  Discontinued        1,250 mg 125 mL/hr over 120 Minutes Intravenous Every 12 hours 09/06/20 1617 09/09/20 0952   09/06/20 1715  vancomycin (VANCOREADY) IVPB 2000 mg/400 mL        2,000 mg 133.3 mL/hr over 180 Minutes Intravenous  Once 09/06/20 1617 09/06/20 2037   09/06/20 0600  metroNIDAZOLE (FLAGYL) tablet 500 mg  Status:  Discontinued        500 mg Oral Every 8 hours 09/06/20 0343 09/06/20 0428   09/06/20 0400  ceFEPIme (MAXIPIME) 2 g in sodium chloride 0.9 % 100 mL IVPB  Status:  Discontinued        2 g 200 mL/hr over 30 Minutes Intravenous Every 8 hours 09/06/20 0348  09/08/20 1451   09/05/20 2345  DAPTOmycin (CUBICIN) 500 mg in sodium chloride 0.9 % IVPB  Status:  Discontinued  500 mg 220 mL/hr over 30 Minutes Intravenous Daily 09/05/20 2336 09/06/20 1837   09/05/20 2330  cefTRIAXone (ROCEPHIN) 2 g in sodium chloride 0.9 % 100 mL IVPB        2 g 200 mL/hr over 30 Minutes Intravenous  Once 09/05/20 2319 09/06/20 0301        Subjective: Seen and examined at bedside and was doing well but states that she started having some paresthesias in her right foot whenever she stood up and put pressure on it.  No chest pain, lightheadedness or dizziness.  Feels okay and feels better.  Has more motion in her legs now and thinks that she will need therapy to get stronger.  Denies any other concerns or complaints at this time.  Objective: Vitals:   09/09/20 1649 09/09/20 2219 09/10/20 0043 09/10/20 0423  BP: (!) 152/78 134/70 137/60 119/62  Pulse: 89 99 82 80  Resp: 20 18 20  (!) 22  Temp: 98.7 F (37.1 C) 99.1 F (37.3 C) 98.6 F (37 C) 98.6 F (37 C)  TempSrc: Oral Oral Oral Oral  SpO2: 92% 93% 90% 92%  Weight:    123.3 kg  Height:        Intake/Output Summary (Last 24 hours) at 09/10/2020 0810 Last data filed at 09/10/2020 0600 Gross per 24 hour  Intake 2482 ml  Output 1550 ml  Net 932 ml   Filed Weights   09/08/20 0404 09/09/20 0316 09/10/20 0423  Weight: 126.3 kg 124.4 kg 123.3 kg   Examination: Physical Exam:  Constitutional: WN/WD super morbidly obese pleasant Caucasian female currently no acute distress appears calm and comfortable Eyes: Lids and conjunctivae normal, sclerae anicteric  ENMT: External Ears, Nose appear normal. Grossly normal hearing. Neck: Appears normal, supple, no cervical masses, normal ROM, no appreciable thyromegaly; no appreciable JVD but this is difficult to assess given her body habitus Respiratory: Mildly diminished to auscultation bilaterally, no wheezing, rales, rhonchi or crackles. Normal respiratory effort  and patient is not tachypenic. No accessory muscle use.  Unlabored breathing Cardiovascular: RRR, no murmurs / rubs / gallops. S1 and S2 auscultated.  Has some slight lower extremity edema Abdomen: Soft, non-tender, distended secondary body habitus. Bowel sounds positive.  GU: Deferred. Musculoskeletal: No clubbing / cyanosis of digits/nails. No joint deformity upper and lower extremities.  Skin: Has some erythema and warmth in the thigh proximally from the knee and has lower extremity wounds that are wrapped and has some blistering.  She has an area on her right leg as well there is also slightly warm and erythematous and blistering.  No appreciable induration Neurologic: CN 2-12 grossly intact with no focal deficits. Romberg sign and cerebellar reflexes not assessed.  Psychiatric: Normal judgment and insight. Alert and oriented x 3. Normal mood and appropriate affect.   Data Reviewed: I have personally reviewed following labs and imaging studies  CBC: Recent Labs  Lab 09/06/20 0012 09/06/20 0528 09/07/20 0529 09/08/20 0315 09/09/20 0218 09/10/20 0416  WBC 23.8* 16.7* 19.2* 18.9* 15.6* 14.8*  NEUTROABS 20.3*  --  14.1* 12.8* 10.5* 10.7*  HGB 13.3 15.9* 12.0 11.4* 12.2 11.9*  HCT 41.7 51.9* 38.9 37.9 41.5 37.6  MCV 85.1 85.6 86.6 88.1 89.4 87.4  PLT 254 215 273 303 268 366   Basic Metabolic Panel: Recent Labs  Lab 09/06/20 0528 09/07/20 0251 09/08/20 0315 09/09/20 0218 09/10/20 0416  NA 131* 133* 135 135 135  K 3.8 3.7 3.9 4.0 4.2  CL 98 103 103 101 102  CO2 22 22 23 24 24   GLUCOSE 108* 119* 87 102* 115*  BUN 15 9 7* 7* 8  CREATININE 0.79 0.68 0.64 0.72 0.66  CALCIUM 9.2 8.8* 8.9 9.0 9.1  MG  --  2.1 2.2 2.2 2.2  PHOS  --  3.1 3.4 3.5 3.8   GFR: Estimated Creatinine Clearance: 86.3 mL/min (by C-G formula based on SCr of 0.66 mg/dL). Liver Function Tests: Recent Labs  Lab 09/06/20 0012 09/07/20 0251 09/08/20 0315 09/09/20 0218 09/10/20 0416  AST 83* 85* 66* 60*  40  ALT 34 41 43 45* 40  ALKPHOS 59 58 64 60 56  BILITOT 0.9 0.4 0.6 0.5 0.3  PROT 7.3 6.2* 6.5 7.1 7.3  ALBUMIN 2.2* 1.7* 1.9* 2.1* 2.0*   No results for input(s): LIPASE, AMYLASE in the last 168 hours. No results for input(s): AMMONIA in the last 168 hours. Coagulation Profile: Recent Labs  Lab 09/06/20 0012 09/07/20 0251 09/08/20 0315 09/09/20 0218 09/10/20 0416  INR 1.2 1.3* 1.6* 2.0* 2.2*   Cardiac Enzymes: Recent Labs  Lab 09/06/20 0528 09/07/20 0251 09/08/20 0315 09/09/20 0218 09/10/20 0416  CKTOTAL 4,082* 1,725* 850* 463* 154   BNP (last 3 results) Recent Labs    06/06/20 0903  PROBNP 1,079*   HbA1C: Recent Labs    09/09/20 0218  HGBA1C 6.1*   CBG: No results for input(s): GLUCAP in the last 168 hours. Lipid Profile: No results for input(s): CHOL, HDL, LDLCALC, TRIG, CHOLHDL, LDLDIRECT in the last 72 hours. Thyroid Function Tests: No results for input(s): TSH, T4TOTAL, FREET4, T3FREE, THYROIDAB in the last 72 hours. Anemia Panel: Recent Labs    09/10/20 0416  VITAMINB12 318  FOLATE 7.6  FERRITIN 291  TIBC 272  IRON 39  RETICCTPCT 1.8   Sepsis Labs: Recent Labs  Lab 09/06/20 0012 09/06/20 0214 09/06/20 0528  PROCALCITON  --   --  2.10  LATICACIDVEN 1.7 1.6  --     Recent Results (from the past 240 hour(s))  Blood culture (routine x 2)     Status: None (Preliminary result)   Collection Time: 09/06/20 12:12 AM   Specimen: BLOOD  Result Value Ref Range Status   Specimen Description BLOOD RIGHT ANTECUBITAL  Final   Special Requests   Final    BOTTLES DRAWN AEROBIC AND ANAEROBIC Blood Culture adequate volume   Culture   Final    NO GROWTH 3 DAYS Performed at [facility], [place] [address], [place], [place] [address]    Report Status PENDING  Incomplete  Blood culture (routine x 2)     Status: None (Preliminary result)   Collection Time: 09/06/20  2:14 AM   Specimen: BLOOD  Result Value Ref Range Status   Specimen Description  BLOOD LEFT ANTECUBITAL  Final   Special Requests   Final    BOTTLES DRAWN AEROBIC AND ANAEROBIC Blood Culture results may not be optimal due to an excessive volume of blood received in culture bottles   Culture   Final    NO GROWTH 3 DAYS Performed at [facility], [place] [address], [place], [place] [address]    Report Status PENDING  Incomplete  SARS CORONAVIRUS 2 (TAT 6-24 HRS) Nasopharyngeal Nasopharyngeal Swab     Status: None   Collection Time: 09/06/20  2:14 AM   Specimen: Nasopharyngeal Swab  Result Value Ref Range Status   SARS Coronavirus 2 NEGATIVE NEGATIVE Final    Comment: (NOTE) SARS-CoV-2 target nucleic acids are NOT DETECTED.  The SARS-CoV-2 RNA is generally detectable in upper and lower respiratory specimens during the acute phase of infection. Negative results do not preclude SARS-CoV-2 infection, do not rule out co-infections with other pathogens, and should not be used as the sole basis for treatment or other patient management decisions. Negative results must be combined with clinical observations, patient history, and epidemiological information. The expected result is Negative.  Fact Sheet for Patients: SugarRoll.be  Fact Sheet for Healthcare Providers: https://www.woods-mathews.com/  This test is not yet approved or cleared by the [place] FDA and  has been authorized for detection and/or diagnosis of SARS-CoV-2 by FDA under an Emergency Use Authorization (EUA). This EUA will remain  in effect (meaning this test can be used) for the duration of the COVID-19 declaration under Se ction 564(b)(1) of the Act, 21 U.S.C. section 360bbb-3(b)(1), unless the authorization is terminated or revoked sooner.  Performed at [facility], [place] [address] [place], [place] [address]   MRSA PCR Screening     Status: Abnormal   Collection Time: 09/07/20 12:38 AM   Specimen: Nasal Mucosa; Nasopharyngeal  Result  Value Ref Range Status   MRSA by PCR POSITIVE (A) NEGATIVE Final    Comment:        The GeneXpert MRSA Assay (FDA approved for NASAL specimens only), is one component of a comprehensive MRSA colonization surveillance program. It is not intended to diagnose MRSA infection nor to guide or monitor treatment for MRSA infections. RESULT CALLED TO, READ BACK BY AND VERIFIED WITH: Quintella Reichert RN 09/07/20 [id number] Performed at [facility], [place] [address] [place], [place] [address]      RN Pressure Injury Documentation:     Estimated body mass index is 51.36 kg/m as calculated from the following:   Height as of this encounter: 5\' 1"  (1.549 m).   Weight as of this encounter: 123.3 kg.  Malnutrition Type:   Malnutrition Characteristics:   Nutrition Interventions:   Radiology Studies: No results found. Scheduled Meds: . Chlorhexidine Gluconate Cloth  6 each Topical Q0600  . mupirocin ointment  1 application Nasal BID  . warfarin  1.25 mg Oral ONCE-1600  . Warfarin - Pharmacist Dosing Inpatient   Does not apply q1600   Continuous Infusions: . sodium chloride Stopped (09/07/20 0535)  . cefTRIAXone (ROCEPHIN)  IV 2 g (09/09/20 2129)  . vancomycin      LOS: 4 days    Kerney Elbe, DO [facility] PAGER is on [place]  If 7PM-7AM, please contact night-coverage [email or web address]

## 2020-09-11 DIAGNOSIS — R600 Localized edema: Secondary | ICD-10-CM | POA: Diagnosis not present

## 2020-09-11 DIAGNOSIS — A419 Sepsis, unspecified organism: Secondary | ICD-10-CM | POA: Diagnosis not present

## 2020-09-11 DIAGNOSIS — L0390 Cellulitis, unspecified: Secondary | ICD-10-CM | POA: Diagnosis not present

## 2020-09-11 DIAGNOSIS — I5032 Chronic diastolic (congestive) heart failure: Secondary | ICD-10-CM | POA: Diagnosis not present

## 2020-09-11 LAB — RESP PANEL BY RT-PCR (FLU A&B, COVID) ARPGX2
Influenza A by PCR: NEGATIVE
Influenza B by PCR: NEGATIVE
SARS Coronavirus 2 by RT PCR: NEGATIVE

## 2020-09-11 LAB — CBC WITH DIFFERENTIAL/PLATELET
Abs Immature Granulocytes: 0.51 10*3/uL — ABNORMAL HIGH (ref 0.00–0.07)
Basophils Absolute: 0.1 10*3/uL (ref 0.0–0.1)
Basophils Relative: 1 %
Eosinophils Absolute: 0.7 10*3/uL — ABNORMAL HIGH (ref 0.0–0.5)
Eosinophils Relative: 6 %
HCT: 42.4 % (ref 36.0–46.0)
Hemoglobin: 12.5 g/dL (ref 12.0–15.0)
Immature Granulocytes: 4 %
Lymphocytes Relative: 12 %
Lymphs Abs: 1.6 10*3/uL (ref 0.7–4.0)
MCH: 26.3 pg (ref 26.0–34.0)
MCHC: 29.5 g/dL — ABNORMAL LOW (ref 30.0–36.0)
MCV: 89.1 fL (ref 80.0–100.0)
Monocytes Absolute: 0.5 10*3/uL (ref 0.1–1.0)
Monocytes Relative: 4 %
Neutro Abs: 9.4 10*3/uL — ABNORMAL HIGH (ref 1.7–7.7)
Neutrophils Relative %: 73 %
Platelets: 361 10*3/uL (ref 150–400)
RBC: 4.76 MIL/uL (ref 3.87–5.11)
RDW: 16.5 % — ABNORMAL HIGH (ref 11.5–15.5)
WBC: 12.7 10*3/uL — ABNORMAL HIGH (ref 4.0–10.5)
nRBC: 0 % (ref 0.0–0.2)

## 2020-09-11 LAB — COMPREHENSIVE METABOLIC PANEL WITH GFR
ALT: 36 U/L (ref 0–44)
AST: 29 U/L (ref 15–41)
Albumin: 2.3 g/dL — ABNORMAL LOW (ref 3.5–5.0)
Alkaline Phosphatase: 52 U/L (ref 38–126)
Anion gap: 8 (ref 5–15)
BUN: 6 mg/dL — ABNORMAL LOW (ref 8–23)
CO2: 25 mmol/L (ref 22–32)
Calcium: 9.3 mg/dL (ref 8.9–10.3)
Chloride: 100 mmol/L (ref 98–111)
Creatinine, Ser: 0.68 mg/dL (ref 0.44–1.00)
GFR, Estimated: >60 mL/min
Glucose, Bld: 107 mg/dL — ABNORMAL HIGH (ref 70–99)
Potassium: 3.9 mmol/L (ref 3.5–5.1)
Sodium: 133 mmol/L — ABNORMAL LOW (ref 135–145)
Total Bilirubin: 0.7 mg/dL (ref 0.3–1.2)
Total Protein: 8.1 g/dL (ref 6.5–8.1)

## 2020-09-11 LAB — CULTURE, BLOOD (ROUTINE X 2)
Culture: NO GROWTH
Culture: NO GROWTH
Special Requests: ADEQUATE

## 2020-09-11 LAB — PHOSPHORUS: Phosphorus: 3.4 mg/dL (ref 2.5–4.6)

## 2020-09-11 LAB — PROTIME-INR
INR: 2.2 — ABNORMAL HIGH (ref 0.8–1.2)
Prothrombin Time: 23.3 s — ABNORMAL HIGH (ref 11.4–15.2)

## 2020-09-11 LAB — MAGNESIUM: Magnesium: 2.1 mg/dL (ref 1.7–2.4)

## 2020-09-11 MED ORDER — WARFARIN SODIUM 2.5 MG PO TABS
2.5000 mg | ORAL_TABLET | Freq: Once | ORAL | Status: AC
  Administered 2020-09-11: 2.5 mg via ORAL
  Filled 2020-09-11: qty 1

## 2020-09-11 MED ORDER — FERROUS SULFATE 325 (65 FE) MG PO TABS
325.0000 mg | ORAL_TABLET | Freq: Every day | ORAL | Status: DC
  Administered 2020-09-11 – 2020-09-12 (×2): 325 mg via ORAL
  Filled 2020-09-11: qty 1

## 2020-09-11 MED ORDER — TORSEMIDE 20 MG PO TABS
60.0000 mg | ORAL_TABLET | Freq: Every day | ORAL | Status: DC
  Administered 2020-09-11 – 2020-09-12 (×2): 60 mg via ORAL
  Filled 2020-09-11 (×2): qty 3

## 2020-09-11 NOTE — Progress Notes (Signed)
PROGRESS NOTE    [name]  KWI:[id number] DOB: August 12, 1956 DOA: 09/05/2020 PCP: Noni Saupe, MD   Brief Narrative:  The patient is a super morbidly obese Caucasian female with a past medical history significant for but not limited to chronic lymphedema of the bilateral lower extremities with left being greater than right, CAD, history of aortic stenosis status post AVR with bioprosthetic valve on chronic anticoagulation with Coumadin with a goal INR 2-3, history of TIA, as well as other comorbidities who presented after a fall and was found to have worsening left lower extremity erythema.  Patient states the onset was about a week ago and progressively gotten worse and was severe.  She slipped off the bed and got stuck between the bed and the wall for approximately hours.  Now complains of right knee and hip pain.  Left leg was worse with erythema and edema.  Please see the pictures and Dr. Boston Service note.  **Interim History Sepsis physiology is improved and we have changed her daptomycin to vancomycin.  She is tolerating her vancomycin just fine.  WBC had initially improved now slightly worsened.  We will repeat her procalcitonin level in the a.m.  Patient thinks that her leg looks a little bit better but I have asked the nurse to mark the skin today.  We will continue to monitor and if she is not improving will obtain imaging of her leg and ID consultation.  She will need to have a PT OT consultation as well.  Rhabdomyolysis is improving.  INR remained subtherapeutic and she remained on a heparin bridge and it is now INR is 2.2 today.  Will need to continue to monitor for signs and symptoms of bleeding.  Repeat CK this morning is 154.  WBC is slightly improved to 14.8.  Continuing IV vancomycin but will deescalate IV cefepime to IV Ceftriaxone and may discuss with infectious diseases for further recommendations on transitioning.  PT OT evaluated and recommending skilled nursing facility.   Her numbers are improving and she is agreeable to SNF but currently has no bed offers as now.   Assessment & Plan:   Principal Problem:   Sepsis due to cellulitis [facility]) Active Problems:   Edema of both legs   H/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   Morbid obesity (HCC)   Chronic diastolic CHF (congestive heart failure) (HCC)  Sepsis due to LLE cellulitis, this is improving -Sepsis was present on admission; she was documented to have a tachypnea with a respiratory rate of 22, tachycardia with a pulse rate of 107, and a leukocytosis of 23.8 -Was continued on IV Cefepime but will de-escalate this to IV Ceftriaxone now, placed on daptomycin in the ED this was changed to IV vancomycin.  Now will de-escalate further to p.o. Antibiotics and start on monotherapy with doxycycline 100 mg p.o. twice daily and will continue for total of 7 to 10 days; it appears that she has received IV Abx for 5 Days, so we will continue for 5 more days oral antibiotics -Patient had some minor itching today so will start Hydroxyzine 25 mg po TIDprn -MRSA PCR nares was positive -BCx pending -PCT was 2.10; WBC was 23.8 and is improved to 12.7 today -Wound care consult for leg -No subq emphysema, dont think she has Maryjo Rochester. -I have asked the nurse to mark her skin view response to antibiotics -We will obtain PT OT evaluation they are recommending skilled nursing facility and patient is agreeable to  this; TOC consulted for assistance with placement but currently she has no bed offers  Right knee pain -Improving -Had a CT scan of her knee which showed "No acute fracture or dislocation. Edema around the medial patellar retinaculum with slight lateral subluxation of the patella. Probable small loose body in the region of the posterior medial meniscus." -Continue to monitor and PT OT recommending SNF when her knee pain is getting better  Right Ankle Weakness -States that she starts getting paresthesias when she  tries to put weight on her ankle. -We will start with a DG x-ray of the ankle andf it showed "Lateral soft tissue swelling. No acute bony abnormality. Specifically, no fracture, subluxation, or dislocation. Plantar and posterior calcaneal spurs. Degenerative changes in the tarsal region." -C/w PT/OT; May need ankle brace if has Laxity   Right Hip Pain -DG X-Ray showed "Degenerative changes in the right hip with joint space narrowing and osteophyte formation on both sides of the hip. Heterotopic ossification over the greater trochanter. No evidence of acute fracture or dislocation. No focal bone lesions. Soft tissues are unremarkable."  Rhabdomyolysis -CPK went from 3900 -> 4082 -> 1725 -> 850 -> 463 -> 154 -UA pending, urine looks very dark -IVF: giving another 1L bolus now (1.25L total bolus volume) and 150 cc/hr LR has now been discontinued and now her heparin drip is stopped -We will not repeat CPK in the a.m. as it is normalized. -Strict intake and output; encourage oral intake  Abnormal LFTs, improved -In the setting of rhabdomyolysis and were initially improving.  AST went from 85 -> 66 -> 60 -> 40 -> 29 -ALT has now gone from 41 -> 43 -> 45 -> 40 -> 36 -Continue to monitor and trend and repeat CMP in a.m. -if necessary will obtain a right upper quadrant ultrasound and a acute hepatitis panel but will hold off for now -Holding Pravastatin 40 mg po daily   Chronic dCHF  History of Aortic stenosis status post AVR bioprosthetic valve -Was holding diuretics but will resume Torsemide 60 mg Daily -Was Holding Metoprolol but will resume now  -Coumadin per pharm consult, on coumadin chronically for stroke prevention, currently INR 2.2, goal INR 2-3 per patient.   We will continue to bridge the patient with heparin drip and Coumadin until her INR is therapeutic and her heparin drip is now stopped yesterday -IV fluid hydration is now stopped; She is +2.4245 Liters -Continue monitor for  signs and symptoms of volume overload  -Strict I's and O's and daily weights -C/w Heparin bridge for the moment and transition off in the next few days  Hyponatremia -Mild and improving -Na+ went from 129 -> 131 and is 133 -> 135 -> 133 -IVF Hydration stopped -Continue to Monitor and Trend  Super Morbid Obesity -Complicates overall prognosis and care -Estimated body mass index is 51.69 kg/m as calculated from the following:   Height as of this encounter: 5\' 1"  (1.549 m).   Weight as of this encounter: 124.1 kg. -Weight loss and dietary counseling given -Nutritionist consulted and recommended Ensure Max po BID, 1 packet Juven BID, and MVI + Minerals Daily  Hyperglycemia in a patient with a history of Diabetes Mellitus Type 2 -Checked Hemoglobin A1c is now 6.1; last hemoglobin A1c was 7.0 a year ago -Continue to monitor blood sugars carefully and if necessary will place on sensitive NovoLog/scale insulin -Blood sugars have been ranging from 87-119 on daily BMPs/CMP; Repeat this AM was 115 -Nutritionist consulted for diet  education  Normocytic Anemia -Patient's hemoglobin/hematocrit went from 50.9/51.9 -> 12.0/38.9 -> 11.4/37.9 -> 12.2/41.5 -> 11.9/37.6 -> 12.5/42.4 -Anemia Panel showed an iron level of 39, U IBC 233, TIBC of 272, saturation ratios of 14%, ferritin level of 291, folate level 7.6, vitamin B12 level of 318 -Continue to monitor for signs and symptoms of bleeding as she is anticoagulated on a heparin drip as well as Coumadin; currently no overt bleeding noted -Repeat CBC in the a.m.  DVT prophylaxis: Anticoagulated with a heparin drip as well as Coumadin given Subtherapeutic INR Code Status: FULL CODE Family Communication: No family present at bedside  Disposition Plan: SNF at discharge; she is improving from a cellulitis standpoint and has a Therapeutic INR. She is stable to D/C whenever bed is available.   Status is: Inpatient  Remains inpatient appropriate  because:Unsafe d/c plan, IV treatments appropriate due to intensity of illness or inability to take PO and Inpatient level of care appropriate due to severity of illness   Dispo: The patient is from: Home              Anticipated d/c is to: SNF              Anticipated d/c date is: 1 day              Patient currently is medically stable to d/c.  Consultants:   None but discussed transition of Vanc/Ceftriaxone with Dr. Johnnye Sima    Procedures: None  Antimicrobials:  Anti-infectives (From admission, onward)   Start     Dose/Rate Route Frequency Ordered Stop   09/10/20 2215  vancomycin (VANCOCIN) IVPB 1000 mg/200 mL premix  Status:  Discontinued        1,000 mg 100 mL/hr over 120 Minutes Intravenous Every 12 hours 09/09/20 2108 09/09/20 2250   09/10/20 1215  doxycycline (VIBRA-TABS) tablet 100 mg        100 mg Oral Every 12 hours 09/10/20 1149     09/10/20 1000  vancomycin (VANCOCIN) IVPB 1000 mg/200 mL premix  Status:  Discontinued        1,000 mg 100 mL/hr over 120 Minutes Intravenous Every 12 hours 09/09/20 2250 09/10/20 1152   09/09/20 2130  cefTRIAXone (ROCEPHIN) 2 g in sodium chloride 0.9 % 100 mL IVPB  Status:  Discontinued        2 g 200 mL/hr over 30 Minutes Intravenous Every 24 hours 09/09/20 2108 09/10/20 1152   09/09/20 1800  vancomycin (VANCOCIN) IVPB 1000 mg/200 mL premix  Status:  Discontinued        1,000 mg 100 mL/hr over 120 Minutes Intravenous Every 12 hours 09/09/20 0952 09/09/20 2108   09/08/20 2000  cefTRIAXone (ROCEPHIN) 2 g in sodium chloride 0.9 % 100 mL IVPB  Status:  Discontinued        2 g 200 mL/hr over 30 Minutes Intravenous Every 24 hours 09/08/20 1451 09/09/20 2108   09/07/20 0600  vancomycin (VANCOREADY) IVPB 1250 mg/250 mL  Status:  Discontinued        1,250 mg 125 mL/hr over 120 Minutes Intravenous Every 12 hours 09/06/20 1617 09/09/20 0952   09/06/20 1715  vancomycin (VANCOREADY) IVPB 2000 mg/400 mL        2,000 mg 133.3 mL/hr over 180 Minutes  Intravenous  Once 09/06/20 1617 09/06/20 2037   09/06/20 0600  metroNIDAZOLE (FLAGYL) tablet 500 mg  Status:  Discontinued        500 mg Oral Every 8 hours 09/06/20 0343 09/06/20 0428  09/06/20 0400  ceFEPIme (MAXIPIME) 2 g in sodium chloride 0.9 % 100 mL IVPB  Status:  Discontinued        2 g 200 mL/hr over 30 Minutes Intravenous Every 8 hours 09/06/20 0348 09/08/20 1451   09/05/20 2345  DAPTOmycin (CUBICIN) 500 mg in sodium chloride 0.9 % IVPB  Status:  Discontinued        500 mg 220 mL/hr over 30 Minutes Intravenous Daily 09/05/20 2336 09/06/20 1837   09/05/20 2330  cefTRIAXone (ROCEPHIN) 2 g in sodium chloride 0.9 % 100 mL IVPB        2 g 200 mL/hr over 30 Minutes Intravenous  Once 09/05/20 2319 09/06/20 0301        Subjective: Seen and examined at bedside and felt well. Denied any nausea or vomiting. Wanting to work with therapy.  No acute changes overnight and labs are relatively stable and WBC is normal.  No other concerns or complaints at this time  Objective: Vitals:   09/10/20 1103 09/10/20 2023 09/11/20 0430 09/11/20 0719  BP: 138/68 (!) 135/54 134/66 (!) 122/59  Pulse: 82 88 88 92  Resp: 20 18 18 19   Temp: (!) 97.5 F (36.4 C) 97.9 F (36.6 C) 98.5 F (36.9 C) 98.6 F (37 C)  TempSrc: Oral Oral Oral Oral  SpO2: 96% 94% 91% 92%  Weight:   124.1 kg   Height:        Intake/Output Summary (Last 24 hours) at 09/11/2020 0816 Last data filed at 09/11/2020 0200 Gross per 24 hour  Intake 240 ml  Output 1802 ml  Net -1562 ml   Filed Weights   09/09/20 0316 09/10/20 0423 09/11/20 0430  Weight: 124.4 kg 123.3 kg 124.1 kg   Examination: Physical Exam:  Constitutional: WN/WD super morbidly obese Caucasian female who is pleasant and in no acute distress appears appears calm and comfortable. Eyes: Lids and conjunctivae normal, sclerae anicteric  ENMT: External Ears, Nose appear normal. Grossly normal hearing. Neck: Appears normal, supple, no cervical masses, normal ROM,  no appreciable thyromegaly; no JVD this is difficult to assess given her body habitus Respiratory: Slightly diminished to auscultation bilaterally, no wheezing, rales, rhonchi or crackles. Normal respiratory effort and patient is not tachypenic. No accessory muscle use.  Unlabored breathing Cardiovascular: RRR, no murmurs / rubs / gallops. S1 and S2 auscultated.  Minimal extremity edema Abdomen: Soft, non-tender, distended secondary body habitus. Bowel sounds positive.  GU: Deferred. Musculoskeletal: No clubbing / cyanosis of digits/nails. No joint deformity upper and lower extremities.  Skin: Left lower extremity is wrapped and left thigh meeting and just proximal to the knee is less erythematous and not as warm.  Cellulitis is improving.  No induration; Warm and dry.  Neurologic: CN 2-12 grossly intact with no focal deficits. Romberg sign and cerebellar reflexes not assessed.  Psychiatric: Normal judgment and insight. Alert and oriented x 3. Normal mood and appropriate affect.   Data Reviewed: I have personally reviewed following labs and imaging studies  CBC: Recent Labs  Lab 09/07/20 0529 09/08/20 0315 09/09/20 0218 09/10/20 0416 09/11/20 0410  WBC 19.2* 18.9* 15.6* 14.8* 12.7*  NEUTROABS 14.1* 12.8* 10.5* 10.7* 9.4*  HGB 12.0 11.4* 12.2 11.9* 12.5  HCT 38.9 37.9 41.5 37.6 42.4  MCV 86.6 88.1 89.4 87.4 89.1  PLT 273 303 268 366 [id number]   Basic Metabolic Panel: Recent Labs  Lab 09/07/20 0251 09/08/20 0315 09/09/20 0218 09/10/20 0416 09/11/20 0410  NA 133* 135 135 135 133*  K  3.7 3.9 4.0 4.2 3.9  CL 103 103 101 102 100  CO2 22 23 24 24 25   GLUCOSE 119* 87 102* 115* 107*  BUN 9 7* 7* 8 6*  CREATININE 0.68 0.64 0.72 0.66 0.68  CALCIUM 8.8* 8.9 9.0 9.1 9.3  MG 2.1 2.2 2.2 2.2 2.1  PHOS 3.1 3.4 3.5 3.8 3.4   GFR: Estimated Creatinine Clearance: 86.7 mL/min (by C-G formula based on SCr of 0.68 mg/dL). Liver Function Tests: Recent Labs  Lab 09/07/20 0251 09/08/20 0315  09/09/20 0218 09/10/20 0416 09/11/20 0410  AST 85* 66* 60* 40 29  ALT 41 43 45* 40 36  ALKPHOS 58 64 60 56 52  BILITOT 0.4 0.6 0.5 0.3 0.7  PROT 6.2* 6.5 7.1 7.3 8.1  ALBUMIN 1.7* 1.9* 2.1* 2.0* 2.3*   No results for input(s): LIPASE, AMYLASE in the last 168 hours. No results for input(s): AMMONIA in the last 168 hours. Coagulation Profile: Recent Labs  Lab 09/07/20 0251 09/08/20 0315 09/09/20 0218 09/10/20 0416 09/11/20 0410  INR 1.3* 1.6* 2.0* 2.2* 2.2*   Cardiac Enzymes: Recent Labs  Lab 09/06/20 0528 09/07/20 0251 09/08/20 0315 09/09/20 0218 09/10/20 0416  CKTOTAL 4,082* 1,725* 850* 463* 154   BNP (last 3 results) Recent Labs    06/06/20 0903  PROBNP 1,079*   HbA1C: Recent Labs    09/09/20 0218  HGBA1C 6.1*   CBG: No results for input(s): GLUCAP in the last 168 hours. Lipid Profile: No results for input(s): CHOL, HDL, LDLCALC, TRIG, CHOLHDL, LDLDIRECT in the last 72 hours. Thyroid Function Tests: No results for input(s): TSH, T4TOTAL, FREET4, T3FREE, THYROIDAB in the last 72 hours. Anemia Panel: Recent Labs    09/10/20 0416  VITAMINB12 318  FOLATE 7.6  FERRITIN 291  TIBC 272  IRON 39  RETICCTPCT 1.8   Sepsis Labs: Recent Labs  Lab 09/06/20 0012 09/06/20 0214 09/06/20 0528  PROCALCITON  --   --  2.10  LATICACIDVEN 1.7 1.6  --     Recent Results (from the past 240 hour(s))  Blood culture (routine x 2)     Status: None (Preliminary result)   Collection Time: 09/06/20 12:12 AM   Specimen: BLOOD  Result Value Ref Range Status   Specimen Description BLOOD RIGHT ANTECUBITAL  Final   Special Requests   Final    BOTTLES DRAWN AEROBIC AND ANAEROBIC Blood Culture adequate volume   Culture   Final    NO GROWTH 4 DAYS Performed at [facility], [place] [address] [place], [place] [address]    Report Status PENDING  Incomplete  Blood culture (routine x 2)     Status: None (Preliminary result)   Collection Time: 09/06/20  2:14 AM    Specimen: BLOOD  Result Value Ref Range Status   Specimen Description BLOOD LEFT ANTECUBITAL  Final   Special Requests   Final    BOTTLES DRAWN AEROBIC AND ANAEROBIC Blood Culture results may not be optimal due to an excessive volume of blood received in culture bottles   Culture   Final    NO GROWTH 4 DAYS Performed at [facility], [place] [address], [place], [place] [address]    Report Status PENDING  Incomplete  SARS CORONAVIRUS 2 (TAT 6-24 HRS) Nasopharyngeal Nasopharyngeal Swab     Status: None   Collection Time: 09/06/20  2:14 AM   Specimen: Nasopharyngeal Swab  Result Value Ref Range Status   SARS Coronavirus 2 NEGATIVE NEGATIVE Final  Comment: (NOTE) SARS-CoV-2 target nucleic acids are NOT DETECTED.  The SARS-CoV-2 RNA is generally detectable in upper and lower respiratory specimens during the acute phase of infection. Negative results do not preclude SARS-CoV-2 infection, do not rule out co-infections with other pathogens, and should not be used as the sole basis for treatment or other patient management decisions. Negative results must be combined with clinical observations, patient history, and epidemiological information. The expected result is Negative.  Fact Sheet for Patients: SugarRoll.be  Fact Sheet for Healthcare Providers: https://www.woods-mathews.com/  This test is not yet approved or cleared by the [place] FDA and  has been authorized for detection and/or diagnosis of SARS-CoV-2 by FDA under an Emergency Use Authorization (EUA). This EUA will remain  in effect (meaning this test can be used) for the duration of the COVID-19 declaration under Se ction 564(b)(1) of the Act, 21 U.S.C. section 360bbb-3(b)(1), unless the authorization is terminated or revoked sooner.  Performed at [facility], [place] [address] [place], [place] [address]   MRSA PCR Screening     Status: Abnormal   Collection Time:  09/07/20 12:38 AM   Specimen: Nasal Mucosa; Nasopharyngeal  Result Value Ref Range Status   MRSA by PCR POSITIVE (A) NEGATIVE Final    Comment:        The GeneXpert MRSA Assay (FDA approved for NASAL specimens only), is one component of a comprehensive MRSA colonization surveillance program. It is not intended to diagnose MRSA infection nor to guide or monitor treatment for MRSA infections. RESULT CALLED TO, READ BACK BY AND VERIFIED WITH: Quintella Reichert RN 09/07/20 [id number] Performed at [facility], [place] [address], [place], [place] [address]      RN Pressure Injury Documentation:     Estimated body mass index is 51.69 kg/m as calculated from the following:   Height as of this encounter: 5\' 1"  (1.549 m).   Weight as of this encounter: 124.1 kg.  Malnutrition Type: Nutrition Problem: Increased nutrient needs Etiology: wound healing,chronic illness (CHF) Malnutrition Characteristics: Signs/Symptoms: estimated needs Nutrition Interventions: Interventions: Premier Protein,MVI,Juven Radiology Studies: DG Ankle Complete Right  Result Date: 09/10/2020 CLINICAL DATA:  Right lateral ankle pain. EXAM: RIGHT ANKLE - COMPLETE 3+ VIEW COMPARISON:  None. FINDINGS: Lateral soft tissue swelling. No acute bony abnormality. Specifically, no fracture, subluxation, or dislocation. Plantar and posterior calcaneal spurs. Degenerative changes in the tarsal region. IMPRESSION: No acute bony abnormality. Electronically Signed   By: Rolm Baptise M.D.   On: 09/10/2020 11:09   Scheduled Meds: . Chlorhexidine Gluconate Cloth  6 each Topical Q0600  . doxycycline  100 mg Oral Q12H  . ferrous sulfate  325 mg Oral Q breakfast  . metoprolol succinate  50 mg Oral q morning - 10a  . mirabegron ER  25 mg Oral QHS  . multivitamin with minerals  1 tablet Oral Daily  . mupirocin ointment  1 application Nasal BID  . nutrition supplement (JUVEN)  1 packet Oral BID BM  . Ensure Max Protein  11 oz Oral BID  .  Warfarin - Pharmacist Dosing Inpatient   Does not apply q1600   Continuous Infusions: . sodium chloride Stopped (09/07/20 0535)    LOS: 5 days    Kerney Elbe, DO [facility] PAGER is on [place]  If 7PM-7AM, please contact night-coverage [email or web address]

## 2020-09-11 NOTE — Progress Notes (Signed)
ANTICOAGULATION CONSULT NOTE - Follow Up Consult  Pharmacy Consult for Heparin bridge to Warfarin Indication: Tissue AVR and Hx TIA  Patient Measurements: Height: 5\' 1"  (154.9 cm) Weight: 124.1 kg (273 lb 9.5 oz) IBW/kg (Calculated) : 47.8 Heparin Dosing Weight: 78 kg  Vital Signs: Temp: 98.6 F (37 C) (01/03 0719) Temp Source: Oral (01/03 0719) BP: 122/59 (01/03 0719) Pulse Rate: 92 (01/03 0719)  Labs: Recent Labs    09/08/20 1020 09/08/20 1757 09/09/20 0218 09/09/20 0218 09/10/20 0416 09/11/20 0410  HGB  --   --  12.2   < > 11.9* 12.5  HCT  --   --  41.5  --  37.6 42.4  PLT  --   --  268  --  366 361  LABPROT  --   --  21.6*  --  24.0* 23.3*  INR  --   --  2.0*  --  2.2* 2.2*  HEPARINUNFRC 0.38 0.45 0.33  --   --   --   CREATININE  --   --  0.72  --  0.66 0.68  CKTOTAL  --   --  463*  --  154  --    < > = values in this interval not displayed.    Estimated Creatinine Clearance: 86.7 mL/min (by C-G formula based on SCr of 0.68 mg/dL).  Assessment: 65 yr old female on warfarin PTA (1.25mg  daily except 2.5mg  MWF) for hx tissue AVR + TIA with a low INR on admission and started on a heparin bridge. Pharmacy was consulted for heparin bridge to warfarin until INR is therapeutic.  INR continues to be therapeutic at 2.2. CBC stable/WNL. No bleeding reported.   Addendum: changing antibiotics to doxycycline. Monitor INR.   Goal of Therapy:  INR goal 2-3 Monitor platelets by anticoagulation protocol: Yes   Plan:  Warfarin 2.5 mg x1 Monitor daily INR and CBC every 72 hours Monitor for signs/symptoms of bleeding  Thank you 76, PharmD 09/11/2020 8:26 AM  Please check [email or web address] for unit specific pharmacy phone numbers.

## 2020-09-11 NOTE — Plan of Care (Signed)

## 2020-09-11 NOTE — Care Management Important Message (Signed)
Important Message  Patient Details  Name: [name] MRN: [id number] Date of Birth: [birth year]   Medicare Important Message Given:  Yes     Renie Ora 09/11/2020, 9:58 AM

## 2020-09-11 NOTE — Progress Notes (Signed)
Pt has home CPAP.  Pt doesn't need assistance

## 2020-09-11 NOTE — Progress Notes (Signed)
CSW spoke with pt about bed offer. She is okay with [facility] tomorrow. CSW called Selena Batten at [facility]. Selena Batten explains pt should be able to be admitted pending power outages are fixed. CSW to follow up in the A.M.

## 2020-09-12 DIAGNOSIS — I5032 Chronic diastolic (congestive) heart failure: Secondary | ICD-10-CM | POA: Diagnosis not present

## 2020-09-12 DIAGNOSIS — L0390 Cellulitis, unspecified: Secondary | ICD-10-CM | POA: Diagnosis not present

## 2020-09-12 DIAGNOSIS — A419 Sepsis, unspecified organism: Secondary | ICD-10-CM | POA: Diagnosis not present

## 2020-09-12 DIAGNOSIS — R600 Localized edema: Secondary | ICD-10-CM | POA: Diagnosis not present

## 2020-09-12 LAB — CBC WITH DIFFERENTIAL/PLATELET
Abs Immature Granulocytes: 0.28 10*3/uL — ABNORMAL HIGH (ref 0.00–0.07)
Abs Immature Granulocytes: 0.26 10*3/uL — ABNORMAL HIGH (ref 0.00–0.07)
Basophils Absolute: 0.1 10*3/uL (ref 0.0–0.1)
Basophils Absolute: 0.1 10*3/uL (ref 0.0–0.1)
Basophils Relative: 1 %
Basophils Relative: 1 %
Eosinophils Absolute: 0.6 10*3/uL — ABNORMAL HIGH (ref 0.0–0.5)
Eosinophils Absolute: 0.5 10*3/uL (ref 0.0–0.5)
Eosinophils Relative: 4 %
Eosinophils Relative: 4 %
HCT: 45.1 % (ref 36.0–46.0)
HCT: 46.8 % — ABNORMAL HIGH (ref 36.0–46.0)
Hemoglobin: 14.2 g/dL (ref 12.0–15.0)
Hemoglobin: 14.3 g/dL (ref 12.0–15.0)
Immature Granulocytes: 2 %
Immature Granulocytes: 2 %
Lymphocytes Relative: 12 %
Lymphocytes Relative: 11 %
Lymphs Abs: 1.9 10*3/uL (ref 0.7–4.0)
Lymphs Abs: 1.6 10*3/uL (ref 0.7–4.0)
MCH: 27 pg (ref 26.0–34.0)
MCH: 26.6 pg (ref 26.0–34.0)
MCHC: 31.5 g/dL (ref 30.0–36.0)
MCHC: 30.6 g/dL (ref 30.0–36.0)
MCV: 85.9 fL (ref 80.0–100.0)
MCV: 87.2 fL (ref 80.0–100.0)
Monocytes Absolute: 0.7 10*3/uL (ref 0.1–1.0)
Monocytes Absolute: 0.8 10*3/uL (ref 0.1–1.0)
Monocytes Relative: 4 %
Monocytes Relative: 6 %
Neutro Abs: 13 10*3/uL — ABNORMAL HIGH (ref 1.7–7.7)
Neutro Abs: 11.4 10*3/uL — ABNORMAL HIGH (ref 1.7–7.7)
Neutrophils Relative %: 77 %
Neutrophils Relative %: 76 %
Platelets: 379 10*3/uL (ref 150–400)
Platelets: 370 10*3/uL (ref 150–400)
RBC: 5.25 MIL/uL — ABNORMAL HIGH (ref 3.87–5.11)
RBC: 5.37 MIL/uL — ABNORMAL HIGH (ref 3.87–5.11)
RDW: 17.2 % — ABNORMAL HIGH (ref 11.5–15.5)
RDW: 16.7 % — ABNORMAL HIGH (ref 11.5–15.5)
WBC: 16.7 10*3/uL — ABNORMAL HIGH (ref 4.0–10.5)
WBC: 14.7 10*3/uL — ABNORMAL HIGH (ref 4.0–10.5)
nRBC: 0 % (ref 0.0–0.2)
nRBC: 0 % (ref 0.0–0.2)

## 2020-09-12 LAB — PHOSPHORUS: Phosphorus: 5 mg/dL — ABNORMAL HIGH (ref 2.5–4.6)

## 2020-09-12 LAB — COMPREHENSIVE METABOLIC PANEL
ALT: 31 U/L (ref 0–44)
AST: 25 U/L (ref 15–41)
Albumin: 2.4 g/dL — ABNORMAL LOW (ref 3.5–5.0)
Alkaline Phosphatase: 47 U/L (ref 38–126)
Anion gap: 12 (ref 5–15)
BUN: 19 mg/dL (ref 8–23)
CO2: 32 mmol/L (ref 22–32)
Calcium: 9.6 mg/dL (ref 8.9–10.3)
Chloride: 92 mmol/L — ABNORMAL LOW (ref 98–111)
Creatinine, Ser: 0.85 mg/dL (ref 0.44–1.00)
GFR, Estimated: >60 mL/min (ref >60)
Glucose, Bld: 115 mg/dL — ABNORMAL HIGH (ref 70–99)
Potassium: 3.7 mmol/L (ref 3.5–5.1)
Sodium: 136 mmol/L (ref 135–145)
Total Bilirubin: 0.9 mg/dL (ref 0.3–1.2)
Total Protein: 8.2 g/dL — ABNORMAL HIGH (ref 6.5–8.1)

## 2020-09-12 LAB — PROTIME-INR
INR: 2.6 — ABNORMAL HIGH (ref 0.8–1.2)
Prothrombin Time: 26.7 seconds — ABNORMAL HIGH (ref 11.4–15.2)

## 2020-09-12 LAB — MAGNESIUM: Magnesium: 1.8 mg/dL (ref 1.7–2.4)

## 2020-09-12 MED ORDER — WARFARIN 1.25 MG HALF TABLET
1.2500 mg | ORAL_TABLET | ORAL | Status: DC
  Filled 2020-09-12: qty 1

## 2020-09-12 MED ORDER — MAGNESIUM SULFATE 2 GM/50ML IV SOLN
2.0000 g | Freq: Once | INTRAVENOUS | Status: AC
  Administered 2020-09-12: 2 g via INTRAVENOUS
  Filled 2020-09-12: qty 50

## 2020-09-12 MED ORDER — ENSURE MAX PROTEIN PO LIQD: 11.0000 [oz_av] | Freq: Two times a day (BID) | ORAL | Status: DC

## 2020-09-12 MED ORDER — DOXYCYCLINE HYCLATE 100 MG PO TABS: 100.0000 mg | ORAL_TABLET | Freq: Two times a day (BID) | ORAL | 0 refills | Status: AC

## 2020-09-12 MED ORDER — HYDROXYZINE HCL 50 MG PO TABS: 50.0000 mg | ORAL_TABLET | Freq: Three times a day (TID) | ORAL | 0 refills | Status: DC | PRN

## 2020-09-12 MED ORDER — JUVEN PO PACK: 1.0000 | PACK | Freq: Two times a day (BID) | ORAL | 0 refills | Status: DC

## 2020-09-12 MED ORDER — WARFARIN SODIUM 2.5 MG PO TABS: 2.5000 mg | ORAL_TABLET | ORAL | Status: DC

## 2020-09-12 MED ORDER — ADULT MULTIVITAMIN W/MINERALS CH: 1.0000 | ORAL_TABLET | Freq: Every day | ORAL | Status: DC

## 2020-09-12 MED ORDER — ACETAMINOPHEN 325 MG PO TABS: 650.0000 mg | ORAL_TABLET | Freq: Four times a day (QID) | ORAL | Status: DC | PRN

## 2020-09-12 NOTE — Plan of Care (Signed)

## 2020-09-12 NOTE — Progress Notes (Signed)
ANTICOAGULATION CONSULT NOTE - Follow Up Consult  Pharmacy Consult for Heparin bridge to Warfarin Indication: Tissue AVR and Hx TIA  Patient Measurements: Height: 5\' 1"  (154.9 cm) Weight: 117.5 kg (259 lb 0.7 oz) IBW/kg (Calculated) : 47.8 Heparin Dosing Weight: 78 kg  Vital Signs: Temp: 98.7 F (37.1 C) (01/04 0735) Temp Source: Oral (01/04 0735) BP: 102/71 (01/04 0735) Pulse Rate: 84 (01/04 0735)  Labs: Recent Labs    09/10/20 0416 09/11/20 0410 09/12/20 0454  HGB 11.9* 12.5 14.2  HCT 37.6 42.4 45.1  PLT 366 361 379  LABPROT 24.0* 23.3* 26.7*  INR 2.2* 2.2* 2.6*  CREATININE 0.66 0.68 0.85  CKTOTAL 154  --   --     Estimated Creatinine Clearance: 77.8 mL/min (by C-G formula based on SCr of 0.85 mg/dL).  Assessment: 65 yr old female on warfarin PTA (1.25mg  daily except 2.5mg  MWF) for hx tissue AVR + TIA with a low INR on admission and started on a heparin bridge. Pharmacy was consulted for heparin bridge to warfarin until INR is therapeutic.  INR continues to be therapeutic.  CBC stable/WNL. No bleeding reported.   Addendum: changing antibiotics to doxycycline. Monitor INR.   Goal of Therapy:  INR goal 2-3 Monitor platelets by anticoagulation protocol: Yes   Plan:  Warfarin 2.5 mg MWF, 1.25 mg other days Monitor daily INR and CBC every 72 hours Monitor for signs/symptoms of bleeding  Thank you 76, PharmD 09/12/2020 8:46 AM  Please check [email or web address] for unit specific pharmacy phone numbers.

## 2020-09-12 NOTE — Progress Notes (Signed)
D/C instructions printed and placed in packet at nurse's station for transport. Tele and IV removed, tolerated well.

## 2020-09-12 NOTE — Discharge Summary (Signed)
Physician Discharge Summary  Martha FinnDeborah L [name] MWN:[id number] DOB: 10/13/1955 DOA: 09/05/2020  PCP: Noni Saupeedding, Martha F. II, MD  Admit date: 09/05/2020 Discharge date: 09/12/2020  Admitted From: Home Disposition: SNF  Recommendations for Outpatient Follow-up:  1. Follow up with PCP in 1-2 weeks 2. Follow up with Pulmonary as an outpatient and repeat Sleep Study 3. Follow up with Cardiology within 1-2 weeks  4. Please obtain CMP/CBC, Mag, Phos in one week 5. Please follow up on the following pending results:  Home Health: No Equipment/Devices: None recommending by PT    Discharge Condition: Stable CODE STATUS: FULL CODE Diet recommendation: Heart Healthy Carb Modified Diet   Brief/Interim Summary: The patient is a super morbidly obese Caucasianfemale with a past medical history significant for but not limited to chronic lymphedema of the bilateral lower extremities with left being greater than right, CAD, history of aortic stenosis status post AVR with bioprosthetic valve on chronic anticoagulation with Coumadin with a goal INR 2-3, history of TIA, as well as other comorbidities who presented after a fall and was found to have worsening left lower extremity erythema. Patient states the onset was about a week ago and progressively gotten worse and was severe. She slipped off the bed and got stuck between the bed and the wall for approximately hours. Now complains of right knee and hip pain. Left leg was worse with erythema and edema. Please see the pictures and Dr. Boston [name]'s note.  **Interim History Sepsis physiology is improved and we have changed her daptomycin to vancomycin.  She is tolerating her vancomycin just fine.  WBC had initially improved now slightly worsened.  We will repeat her procalcitonin level in the a.m.  Patient thinks that her leg looks a little bit better but I have asked the nurse to mark the skin today.  We will continue to monitor and if she is not improving will obtain  imaging of her leg and ID consultation.  She will need to have a PT OT consultation as well.  Rhabdomyolysis is improving.  INR remained subtherapeutic and she remained on a heparin bridge and it is now INR is 2.2 today.  Will need to continue to monitor for signs and symptoms of bleeding.  Repeat CK this morning is 154.  WBC is slightly improved to 14.8.  Continuing IV vancomycin but will deescalate IV cefepime to IV Ceftriaxone and may discuss with infectious diseases for further recommendations on transitioning.  PT OT evaluated and recommending skilled nursing facility.  Her numbers are improving and she is agreeable to SNF and is ready and stable to D/C to [name].   Discharge Diagnoses:  Principal Problem:   Sepsis due to cellulitis [facility](HCC) Active Problems:   Edema of both legs   H/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   Morbid obesity (HCC)   Chronic diastolic CHF (congestive heart failure) (HCC)  Sepsis due toLLEcellulitis, this is improving -Sepsis was present on admission; she was documented to have a tachypnea with a respiratory rate of 22, tachycardia with a pulse rate of 107, and a leukocytosis of 23.8 -Was continued on IV Cefepime but will de-escalate this to IV Ceftriaxone now,placed on daptomycin in the ED this was changed to IV vancomycin.  Now will de-escalate further to p.o. Antibiotics and start on monotherapy with doxycycline 100 mg p.o. twice daily and will continue for total of 7 to 10 days; it appears that she has received IV Abx for 5 Days, so we will continue for 5  more days oral antibiotics -Patient had some minor itching today so will start Hydroxyzine 25 mg po TIDprn -MRSA PCR nares was positive -BCx pending -PCT was 2.10; WBC was 23.8 and is improved to 12.7 yesterday but slightly worsened to 16.7; Will repeat CBC prior to Transfer to SNF and it improved to 14.7; Likely this was reactive as she has been afebrile -Wound care consult for leg -No subq  emphysema, dont think she has [name]. -I have asked the nurse to mark her skin view response to antibiotics -We will obtain PT OT evaluation they are recommending skilled nursing facility and patient is agreeable to this; [facility] consulted for assistance with placement but currently she has no bed offers  Right knee pain -Improving -Had a CT scan of her knee which showed "No acute fracture or dislocation. Edema around the medial patellar retinaculum with slight lateral subluxation of the patella. Probable small loose body in the region of the posterior medial meniscus." -Continue to monitor and PT OT recommending SNF when her knee pain is getting better  Right Ankle Weakness -States that she starts getting paresthesias when she tries to put weight on her ankle. -We will start with a DG x-ray of the ankle andf it showed "Lateral soft tissue swelling. No acute bony abnormality. Specifically, no fracture, subluxation, or dislocation. Plantar and posterior calcaneal spurs. Degenerative changes in the tarsal region." -C/w PT/OT; May need ankle brace if has Laxity   Right Hip Pain -DG X-Ray showed "Degenerative changes in the right hip with joint space narrowing and osteophyte formation on both sides of the hip. Heterotopic ossification over the greater trochanter. No evidence of acute fracture or dislocation. No focal bone lesions. Soft tissues are unremarkable."  Rhabdomyolysis -CPK went from 3900-> 4082 -> 1725 -> 850 -> 463 -> 154 -UA pending, urine looks very dark -IVF: giving another 1L bolus now (1.25L total bolus volume) and 150 cc/hr LR has now been discontinued and now her heparin drip is stopped -We will not repeat CPK in the a.m. as it is normalized. -Strict intake and output; encourage oral intake  Abnormal LFTs, improved -In the setting of rhabdomyolysis and were initially improving.  AST went from 85 -> 66 -> 60 -> 40 -> 29 -> 25 -ALT has now gone from 41 -> 43 -> 45 -> 40 ->  36 -> 31 -Continue to monitor and trend and repeat CMP in a.m. -if necessary will obtain a right upper quadrant ultrasound and a acute hepatitis panel but will hold off for now -Holding Pravastatin 40 mg po daily   ChronicdCHF History ofAortic stenosis status post AVR bioprosthetic valve -Was holding diuretics but will resume Torsemide 60 mg Daily -Was Holding Metoprolol but will resume now  -Coumadin per pharm consult, on coumadin chronically for stroke prevention, currently INR 2.2, goal INR 2-3 per patient.  We will continue to bridge the patient with heparin drip and Coumadin until her INR is therapeutic and her heparin drip is now stopped yesterday -IV fluid hydration is now stopped; She is -2.2655 Liters -Continue monitor for signs and symptoms of volume overload  -Strict I's and O's and daily weights -C/w Heparin bridge for the moment and transition off in the next few days  Hyponatremia -Mild and improving -Na+ went from 129 -> 131 and is 133 -> 135 -> 133 -> 136 -IVF Hydration stopped -Continue to Monitor and Trend  SuperMorbidObesity -Complicates overall prognosis and care -Estimated body mass index is 48.95 kg/m as calculated  from the following:   Height as of this encounter: 5\' 1"  (1.549 m).   Weight as of this encounter: 117.5 kg. -Weight loss and dietary counseling given -Nutritionist consulted and recommended Ensure Max po BID, 1 packet Juven BID, and MVI + Minerals Daily  Hyperglycemia in a patient with a history of Diabetes Mellitus Type 2 -Checked Hemoglobin A1c is now 6.1; last hemoglobin A1c was 7.0 a year ago -Continue to monitor blood sugars carefully and if necessary will place on sensitive NovoLog/scale insulin -Blood sugars have been ranging from 87-119 on daily BMPs/CMP; Repeat this AM was 115 -Nutritionist consulted for diet education  Normocytic Anemia -Patient's hemoglobin/hematocrit went from 50.9/51.9 -> 12.0/38.9 -> 11.4/37.9 -> 12.2/41.5  -> 11.9/37.6 -> 12.5/42.4 -> 14.2/45.1 -> 14.3/46.8 -Anemia Panel showed an iron level of 39, U IBC 233, TIBC of 272, saturation ratios of 14%, ferritin level of 291, folate level 7.6, vitamin B12 level of 318 -Continue to monitor for signs and symptoms of bleeding as she is anticoagulated on a heparin drip as well as Coumadin; currently no overt bleeding noted -Repeat CBC in the a.m.  Discharge Instructions Discharge Instructions    (HEART FAILURE PATIENTS) Call MD:  Anytime you have any of the following symptoms: 1) 3 pound weight gain in 24 hours or 5 pounds in 1 week 2) shortness of breath, with or without a dry hacking cough 3) swelling in the hands, feet or stomach 4) if you have to sleep on extra pillows at night in order to breathe.   Complete by: As directed    Call MD for:  difficulty breathing, headache or visual disturbances   Complete by: As directed    Call MD for:  extreme fatigue   Complete by: As directed    Call MD for:  hives   Complete by: As directed    Call MD for:  persistant dizziness or light-headedness   Complete by: As directed    Call MD for:  persistant nausea and vomiting   Complete by: As directed    Call MD for:  redness, tenderness, or signs of infection (pain, swelling, redness, odor or green/yellow discharge around incision site)   Complete by: As directed    Call MD for:  severe uncontrolled pain   Complete by: As directed    Call MD for:  temperature >100.4   Complete by: As directed    Diet - low sodium heart healthy   Complete by: As directed    Diet Carb Modified   Complete by: As directed    Discharge instructions   Complete by: As directed    You were cared for by a hospitalist during your hospital stay. If you have any questions about your discharge medications or the care you received while you were in the hospital after you are discharged, you can call the unit and ask to speak with the hospitalist on call if the hospitalist that took care  of you is not available. Once you are discharged, your primary care physician will handle any further medical issues. Please note that NO REFILLS for any discharge medications will be authorized once you are discharged, as it is imperative that you return to your primary care physician (or establish a relationship with a primary care physician if you do not have one) for your aftercare needs so that they can reassess your need for medications and monitor your lab values.  Follow up with PCP. Pulmonary, and Cardiology within 1-2 weeks. Take all  medications as prescribed. If symptoms change or worsen please return to the ED for evaluation   Discharge wound care:   Complete by: As directed    Apply Xeroform gauze Kellie Simmering # 294) to the punched out appearing ulcers on the LLE and secure with Kerlex. Would not recommend any compression until further work up. Change daily   Increase activity slowly   Complete by: As directed      Allergies as of 09/12/2020      Reactions   Vancomycin Itching   Patient can receive vancomycin with slower infusion and prn benadryl for itching       Medication List    TAKE these medications   acetaminophen 325 MG tablet Commonly known as: TYLENOL Take 2 tablets (650 mg total) by mouth every 6 (six) hours as needed for mild pain or headache.   amoxicillin 500 MG capsule Commonly known as: AMOXIL Take 2,000 mg by mouth See admin instructions. 1 hour prior to dental appointment   docusate sodium 100 MG capsule Commonly known as: COLACE Take 100 mg by mouth daily.   doxycycline 100 MG tablet Commonly known as: VIBRA-TABS Take 1 tablet (100 mg total) by mouth every 12 (twelve) hours for 3 days.   ferrous sulfate 324 MG Tbec Take 324 mg by mouth.   hydrOXYzine 50 MG tablet Commonly known as: ATARAX/VISTARIL Take 1 tablet (50 mg total) by mouth 3 (three) times daily as needed for itching.   levonorgestrel 20 MCG/24HR IUD Commonly known as: MIRENA 1 each by  Intrauterine route once.   metoprolol succinate 25 MG 24 hr tablet Commonly known as: Toprol XL Take 2 tablets (50 mg total) by mouth in the morning AND 1 tablet (25 mg total) every evening.   multivitamin with minerals Tabs tablet Take 1 tablet by mouth daily. Start taking on: September 13, 2020   Myrbetriq 25 MG Tb24 tablet Generic drug: mirabegron ER Take 25 mg by mouth at bedtime.   nutrition supplement (JUVEN) Pack Take 1 packet by mouth 2 (two) times daily between meals.   Ensure Max Protein Liqd Take 330 mLs (11 oz total) by mouth 2 (two) times daily.   potassium chloride 10 MEQ tablet Commonly known as: KLOR-CON Take 1 tablet (10 mEq total) by mouth 2 (two) times daily.   pravastatin 40 MG tablet Commonly known as: PRAVACHOL Take 1 tablet (40 mg total) by mouth daily at 6 PM.   ramelteon 8 MG tablet Commonly known as: ROZEREM Take 1 tablet (8 mg total) by mouth at bedtime.   Sarna lotion Generic drug: camphor-menthol Apply 1 application topically as needed for itching.   torsemide 20 MG tablet Commonly known as: DEMADEX Take 3 tablets (60 mg total) by mouth daily.   warfarin 2.5 MG tablet Commonly known as: COUMADIN Take as directed. If you are unsure how to take this medication, talk to your nurse or doctor. Original instructions: Take 1 tablet by mouth once daily What changed:   how much to take  when to take this  additional instructions            Discharge Care Instructions  (From admission, onward)         Start     Ordered   09/12/20 0000  Discharge wound care:       Comments: Apply Xeroform gauze Kellie Simmering # 294) to the punched out appearing ulcers on the LLE and secure with Kerlex. Would not recommend any compression until further work up. Change  daily   09/12/20 1224          Allergies  Allergen Reactions  . Vancomycin Itching    Patient can receive vancomycin with slower infusion and prn benadryl for itching      Consultations:  None; Discussed Case with ID Dr. Johnnye Sima  Procedures/Studies: DG Knee 2 Views Right  Result Date: 09/05/2020 CLINICAL DATA:  Substance fell, right knee pain EXAM: RIGHT KNEE - 1-2 VIEW COMPARISON:  None. FINDINGS: Frontal and cross-table lateral views of the right knee demonstrate moderate medial compartmental joint space narrowing. There is spurring of the medial and patellofemoral compartments. No fracture, subluxation, or dislocation. No joint effusion. IMPRESSION: 1. Medial and patellofemoral compartmental osteoarthritis. No acute fracture. Electronically Signed   By: Randa Ngo M.D.   On: 09/05/2020 23:13   DG Tibia/Fibula Left  Result Date: 09/05/2020 CLINICAL DATA:  Left leg swelling EXAM: LEFT TIBIA AND FIBULA - 2 VIEW COMPARISON:  None. FINDINGS: There is no evidence of fracture or other focal bone lesions. Diffuse subcutaneous edema seen surrounding lower extremity. IMPRESSION: No acute osseous abnormality. Diffuse subcu edema seen surrounding the lower extremity. Electronically Signed   By: Prudencio Pair M.D.   On: 09/05/2020 23:15   DG Ankle Complete Right  Result Date: 09/10/2020 CLINICAL DATA:  Right lateral ankle pain. EXAM: RIGHT ANKLE - COMPLETE 3+ VIEW COMPARISON:  None. FINDINGS: Lateral soft tissue swelling. No acute bony abnormality. Specifically, no fracture, subluxation, or dislocation. Plantar and posterior calcaneal spurs. Degenerative changes in the tarsal region. IMPRESSION: No acute bony abnormality. Electronically Signed   By: Rolm Baptise M.D.   On: 09/10/2020 11:09   CT Knee Right Wo Contrast  Result Date: 09/06/2020 CLINICAL DATA:  Fall, injury EXAM: CT OF THE right KNEE WITHOUT CONTRAST TECHNIQUE: Multidetector CT imaging of the right knee was performed according to the standard protocol. Multiplanar CT image reconstructions were also generated. COMPARISON:  None. FINDINGS: Bones/Joint/Cartilage No fracture or dislocation. No large knee  joint effusion is seen. There is a well corticated ossicle seen within the region of the posterior medial meniscus which could represent a small loose body. Ligaments Suboptimally assessed by CT. Muscles and Tendons Enthesophytes seen at the quadriceps insertion site. The patellar tendon is intact. The muscles are normal appearance without focal atrophy or tear. There is edema around the medial patellar retinaculum with slight lateral subluxation of the patella. Soft tissues Prepatellar subcutaneous edema is noted. No focal soft tissue mass is seen. IMPRESSION: No acute fracture or dislocation. Edema around the medial patellar retinaculum with slight lateral subluxation of the patella. Probable small loose body in the region of the posterior medial meniscus. Electronically Signed   By: Prudencio Pair M.D.   On: 09/06/2020 00:44   DG HIP UNILAT WITH PELVIS 2-3 VIEWS RIGHT  Result Date: 09/06/2020 CLINICAL DATA:  Right hip pain after a fall 3 days ago. EXAM: DG HIP (WITH OR WITHOUT PELVIS) 2-3V RIGHT COMPARISON:  None. FINDINGS: Degenerative changes in the right hip with joint space narrowing and osteophyte formation on both sides of the hip. Heterotopic ossification over the greater trochanter. No evidence of acute fracture or dislocation. No focal bone lesions. Soft tissues are unremarkable. IMPRESSION: Degenerative changes in the right hip. Electronically Signed   By: Lucienne Capers M.D.   On: 09/06/2020 19:05   DG Femur Min 2 Views Left  Result Date: 09/05/2020 CLINICAL DATA:  Slipped and fell, left leg swelling EXAM: LEFT FEMUR 2 VIEWS COMPARISON:  None. FINDINGS:  Frontal and lateral views of the left femur are obtained. No acute fractures. Alignment of the left hip and knee is anatomic. There is mild osteoarthritis of the left hip and medial compartment of the left knee. Visualized portions of the left hemipelvis are unremarkable. IMPRESSION: 1. No acute displaced fracture. 2. 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 Electronically Signed   By: Randa Ngo M.D.   On: 09/05/2020 23:13    Subjective: Seen and examined at bedside and states she is doing better. No nausea or vomiting. Not having Paraesthesias in her foot today and feels stronger. States she has been urinating quite frequently since resuming the Torsemide. No CP, SOB, Nausea or Vomiting. No other concerns or complaints at this time. Ready to go to SNF  Discharge Exam: Vitals:   09/12/20 1000 09/12/20 1143  BP: 105/63 108/75  Pulse: 90 99  Resp:    Temp:  97.7 F (36.5 C)  SpO2: 97% 94%   Vitals:   09/12/20 0552 09/12/20 0735 09/12/20 1000 09/12/20 1143  BP: 124/66 102/71 105/63 108/75  Pulse: 78 84 90 99  Resp: 19 20    Temp: 98.1 F (36.7 C) 98.7 F (37.1 C)  97.7 F (36.5 C)  TempSrc: Oral Oral  Oral  SpO2: 92% 93% 97% 94%  Weight: 117.5 kg     Height:       General: Pt is alert, awake, not in acute distress Cardiovascular: RRR, S1/S2 +, no rubs, no gallops Respiratory: Diminished bilaterally, no wheezing, no rhonchi Abdominal: Soft, NT, Distended 2/2 body habitus, bowel sounds + Extremities: Minimal edema, no cyanosis; LE Erythema is improved and LLE wrapped  The results of significant diagnostics from this hospitalization (including imaging, microbiology, ancillary and laboratory) are listed below for reference.    Microbiology: Recent Results (from the past 240 hour(s))  Blood culture (routine x 2)     Status: None   Collection Time: 09/06/20 12:12 AM   Specimen: BLOOD  Result Value Ref Range Status   Specimen Description BLOOD RIGHT ANTECUBITAL  Final   Special Requests   Final    BOTTLES DRAWN AEROBIC AND ANAEROBIC Blood Culture adequate volume   Culture   Final    NO GROWTH 5 DAYS Performed at [facility], [address], [place], [place] [address]    Report Status 09/11/2020 FINAL  Final  Blood culture (routine x 2)     Status: None   Collection Time: 09/06/20  2:14 AM    Specimen: BLOOD  Result Value Ref Range Status   Specimen Description BLOOD LEFT ANTECUBITAL  Final   Special Requests   Final    BOTTLES DRAWN AEROBIC AND ANAEROBIC Blood Culture results may not be optimal due to an excessive volume of blood received in culture bottles   Culture   Final    NO GROWTH 5 DAYS Performed at [facility], [place] [address], [place], [place] [address]    Report Status 09/11/2020 FINAL  Final  SARS CORONAVIRUS 2 (TAT 6-24 HRS) Nasopharyngeal Nasopharyngeal Swab     Status: None   Collection Time: 09/06/20  2:14 AM   Specimen: Nasopharyngeal Swab  Result Value Ref Range Status   SARS Coronavirus 2 NEGATIVE NEGATIVE Final    Comment: (NOTE) SARS-CoV-2 target nucleic acids are NOT DETECTED.  The SARS-CoV-2 RNA is generally detectable in upper and lower respiratory specimens during the acute phase of infection. Negative results do not preclude SARS-CoV-2 infection, do not rule  out co-infections with other pathogens, and should not be used as the sole basis for treatment or other patient management decisions. Negative results must be combined with clinical observations, patient history, and epidemiological information. The expected result is Negative.  Fact Sheet for Patients: SugarRoll.be  Fact Sheet for Healthcare Providers: https://www.woods-mathews.com/  This test is not yet approved or cleared by the [place] FDA and  has been authorized for detection and/or diagnosis of SARS-CoV-2 by FDA under an Emergency Use Authorization (EUA). This EUA will remain  in effect (meaning this test can be used) for the duration of the COVID-19 declaration under Se ction 564(b)(1) of the Act, 21 U.S.C. section 360bbb-3(b)(1), unless the authorization is terminated or revoked sooner.  Performed at [facility], [place] [address], [place], [place] [address]   MRSA PCR Screening     Status: Abnormal   Collection  Time: 09/07/20 12:38 AM   Specimen: Nasal Mucosa; Nasopharyngeal  Result Value Ref Range Status   MRSA by PCR POSITIVE (A) NEGATIVE Final    Comment:        The GeneXpert MRSA Assay (FDA approved for NASAL specimens only), is one component of a comprehensive MRSA colonization surveillance program. It is not intended to diagnose MRSA infection nor to guide or monitor treatment for MRSA infections. RESULT CALLED TO, READ BACK BY AND VERIFIED WITH: Quintella Reichert RN 09/07/20 [id number] Performed at [facility], [place] [address] [place], [place] [address]   Resp Panel by RT-PCR (Flu A&B, Covid) Nasopharyngeal Swab     Status: None   Collection Time: 09/11/20  4:34 PM   Specimen: Nasopharyngeal Swab; Nasopharyngeal(NP) swabs in vial transport medium  Result Value Ref Range Status   SARS Coronavirus 2 by RT PCR NEGATIVE NEGATIVE Final    Comment: (NOTE) SARS-CoV-2 target nucleic acids are NOT DETECTED.  The SARS-CoV-2 RNA is generally detectable in upper respiratory specimens during the acute phase of infection. The lowest concentration of SARS-CoV-2 viral copies this assay can detect is 138 copies/mL. A negative result does not preclude SARS-Cov-2 infection and should not be used as the sole basis for treatment or other patient management decisions. A negative result may occur with  improper specimen collection/handling, submission of specimen other than nasopharyngeal swab, presence of viral mutation(s) within the areas targeted by this assay, and inadequate number of viral copies(<138 copies/mL). A negative result must be combined with clinical observations, patient history, and epidemiological information. The expected result is Negative.  Fact Sheet for Patients:  EntrepreneurPulse.com.au  Fact Sheet for Healthcare Providers:  IncredibleEmployment.be  This test is no t yet approved or cleared by the [place] FDA and  has been  authorized for detection and/or diagnosis of SARS-CoV-2 by FDA under an Emergency Use Authorization (EUA). This EUA will remain  in effect (meaning this test can be used) for the duration of the COVID-19 declaration under Section 564(b)(1) of the Act, 21 U.S.C.section 360bbb-3(b)(1), unless the authorization is terminated  or revoked sooner.       Influenza A by PCR NEGATIVE NEGATIVE Final   Influenza B by PCR NEGATIVE NEGATIVE Final    Comment: (NOTE) The Xpert Xpress SARS-CoV-2/FLU/RSV plus assay is intended as an aid in the diagnosis of influenza from Nasopharyngeal swab specimens and should not be used as a sole basis for treatment. Nasal washings and aspirates are unacceptable for Xpert Xpress SARS-CoV-2/FLU/RSV testing.  Fact Sheet for Patients: EntrepreneurPulse.com.au  Fact Sheet for Healthcare Providers: IncredibleEmployment.be  This  test is not yet approved or cleared by the [place] and has been authorized for detection and/or diagnosis of SARS-CoV-2 by FDA under an Emergency Use Authorization (EUA). This EUA will remain in effect (meaning this test can be used) for the duration of the COVID-19 declaration under Section 564(b)(1) of the Act, 21 U.S.C. section 360bbb-3(b)(1), unless the authorization is terminated or revoked.  Performed at [facility], [place] [address] [place], [place] [address]      Labs: BNP (last 3 results) Recent Labs    09/15/19 1107  BNP [id number]*   Basic Metabolic Panel: Recent Labs  Lab 09/08/20 0315 09/09/20 0218 09/10/20 0416 09/11/20 0410 09/12/20 0454  NA 135 135 135 133* 136  K 3.9 4.0 4.2 3.9 3.7  CL 103 101 102 100 92*  CO2 23 24 24 25  32  GLUCOSE 87 102* 115* 107* 115*  BUN 7* 7* 8 6* 19  CREATININE 0.64 0.72 0.66 0.68 0.85  CALCIUM 8.9 9.0 9.1 9.3 9.6  MG 2.2 2.2 2.2 2.1 1.8  PHOS 3.4 3.5 3.8 3.4 5.0*   Liver Function Tests: Recent Labs  Lab 09/08/20 0315  09/09/20 0218 09/10/20 0416 09/11/20 0410 09/12/20 0454  AST 66* 60* 40 29 25  ALT 43 45* 40 36 31  ALKPHOS 64 60 56 52 47  BILITOT 0.6 0.5 0.3 0.7 0.9  PROT 6.5 7.1 7.3 8.1 8.2*  ALBUMIN 1.9* 2.1* 2.0* 2.3* 2.4*   No results for input(s): LIPASE, AMYLASE in the last 168 hours. No results for input(s): AMMONIA in the last 168 hours. CBC: Recent Labs  Lab 09/08/20 0315 09/09/20 0218 09/10/20 0416 09/11/20 0410 09/12/20 0454  WBC 18.9* 15.6* 14.8* 12.7* 16.7*  NEUTROABS 12.8* 10.5* 10.7* 9.4* 13.0*  HGB 11.4* 12.2 11.9* 12.5 14.2  HCT 37.9 41.5 37.6 42.4 45.1  MCV 88.1 89.4 87.4 89.1 85.9  PLT 303 268 366 361 379   Cardiac Enzymes: Recent Labs  Lab 09/06/20 0528 09/07/20 0251 09/08/20 0315 09/09/20 0218 09/10/20 0416  CKTOTAL 4,082* 1,725* 850* 463* 154   BNP: Invalid input(s): POCBNP CBG: No results for input(s): GLUCAP in the last 168 hours. D-Dimer No results for input(s): DDIMER in the last 72 hours. Hgb A1c No results for input(s): HGBA1C in the last 72 hours. Lipid Profile No results for input(s): CHOL, HDL, LDLCALC, TRIG, CHOLHDL, LDLDIRECT in the last 72 hours. Thyroid function studies No results for input(s): TSH, T4TOTAL, T3FREE, THYROIDAB in the last 72 hours.  Invalid input(s): FREET3 Anemia work up Recent Labs    09/10/20 0416  VITAMINB12 318  FOLATE 7.6  FERRITIN 291  TIBC 272  IRON 39  RETICCTPCT 1.8   Urinalysis    Component Value Date/Time   COLORURINE AMBER (A) 09/06/2020 0500   APPEARANCEUR HAZY (A) 09/06/2020 0500   LABSPEC 1.025 09/06/2020 0500   PHURINE 6.0 09/06/2020 0500   GLUCOSEU NEGATIVE 09/06/2020 0500   HGBUR LARGE (A) 09/06/2020 0500   BILIRUBINUR NEGATIVE 09/06/2020 0500   KETONESUR NEGATIVE 09/06/2020 0500   PROTEINUR 100 (A) 09/06/2020 0500   NITRITE NEGATIVE 09/06/2020 0500   LEUKOCYTESUR NEGATIVE 09/06/2020 0500   Sepsis Labs Invalid input(s): PROCALCITONIN,  WBC,  LACTICIDVEN Microbiology Recent  Results (from the past 240 hour(s))  Blood culture (routine x 2)     Status: None   Collection Time: 09/06/20 12:12 AM   Specimen: BLOOD  Result Value Ref Range Status   Specimen Description BLOOD RIGHT ANTECUBITAL  Final  Special Requests   Final    BOTTLES DRAWN AEROBIC AND ANAEROBIC Blood Culture adequate volume   Culture   Final    NO GROWTH 5 DAYS Performed at [facility], [address] [place], [place] [address]    Report Status 09/11/2020 FINAL  Final  Blood culture (routine x 2)     Status: None   Collection Time: 09/06/20  2:14 AM   Specimen: BLOOD  Result Value Ref Range Status   Specimen Description BLOOD LEFT ANTECUBITAL  Final   Special Requests   Final    BOTTLES DRAWN AEROBIC AND ANAEROBIC Blood Culture results may not be optimal due to an excessive volume of blood received in culture bottles   Culture   Final    NO GROWTH 5 DAYS Performed at [facility], [address] [place], [place] [address]    Report Status 09/11/2020 FINAL  Final  SARS CORONAVIRUS 2 (TAT 6-24 HRS) Nasopharyngeal Nasopharyngeal Swab     Status: None   Collection Time: 09/06/20  2:14 AM   Specimen: Nasopharyngeal Swab  Result Value Ref Range Status   SARS Coronavirus 2 NEGATIVE NEGATIVE Final    Comment: (NOTE) SARS-CoV-2 target nucleic acids are NOT DETECTED.  The SARS-CoV-2 RNA is generally detectable in upper and lower respiratory specimens during the acute phase of infection. Negative results do not preclude SARS-CoV-2 infection, do not rule out co-infections with other pathogens, and should not be used as the sole basis for treatment or other patient management decisions. Negative results must be combined with clinical observations, patient history, and epidemiological information. The expected result is Negative.  Fact Sheet for Patients: HairSlick.nohttps://www.fda.gov/media/138098/download  Fact Sheet for Healthcare  Providers: quierodirigir.comhttps://www.fda.gov/media/138095/download  This test is not yet approved or cleared by the [place] FDA and  has been authorized for detection and/or diagnosis of SARS-CoV-2 by FDA under an Emergency Use Authorization (EUA). This EUA will remain  in effect (meaning this test can be used) for the duration of the COVID-19 declaration under Se ction 564(b)(1) of the Act, 21 U.S.C. section 360bbb-3(b)(1), unless the authorization is terminated or revoked sooner.  Performed at [facility], [address] [place], [place] [address]   MRSA PCR Screening     Status: Abnormal   Collection Time: 09/07/20 12:38 AM   Specimen: Nasal Mucosa; Nasopharyngeal  Result Value Ref Range Status   MRSA by PCR POSITIVE (A) NEGATIVE Final    Comment:        The GeneXpert MRSA Assay (FDA approved for NASAL specimens only), is one component of a comprehensive MRSA colonization surveillance program. It is not intended to diagnose MRSA infection nor to guide or monitor treatment for MRSA infections. RESULT CALLED TO, READ BACK BY AND VERIFIED WITH: Alicia AmelRICA D RN 09/07/20 [id number] JDW Performed at [facility], [address] [place], [place] [address]   Resp Panel by RT-PCR (Flu A&B, Covid) Nasopharyngeal Swab     Status: None   Collection Time: 09/11/20  4:34 PM   Specimen: Nasopharyngeal Swab; Nasopharyngeal(NP) swabs in vial transport medium  Result Value Ref Range Status   SARS Coronavirus 2 by RT PCR NEGATIVE NEGATIVE Final    Comment: (NOTE) SARS-CoV-2 target nucleic acids are NOT DETECTED.  The SARS-CoV-2 RNA is generally detectable in upper respiratory specimens during the acute phase of infection. The lowest concentration of SARS-CoV-2 viral copies this assay can detect is 138 copies/mL. A negative result does not preclude SARS-Cov-2  infection and should not be used as the sole basis for treatment or other patient management decisions. A negative result may occur  with  improper specimen collection/handling, submission of specimen other than nasopharyngeal swab, presence of viral mutation(s) within the areas targeted by this assay, and inadequate number of viral copies(<138 copies/mL). A negative result must be combined with clinical observations, patient history, and epidemiological information. The expected result is Negative.  Fact Sheet for Patients:  BloggerCourse.com  Fact Sheet for Healthcare Providers:  SeriousBroker.it  This test is no t yet approved or cleared by the [place] FDA and  has been authorized for detection and/or diagnosis of SARS-CoV-2 by FDA under an Emergency Use Authorization (EUA). This EUA will remain  in effect (meaning this test can be used) for the duration of the COVID-19 declaration under Section 564(b)(1) of the Act, 21 U.S.C.section 360bbb-3(b)(1), unless the authorization is terminated  or revoked sooner.       Influenza A by PCR NEGATIVE NEGATIVE Final   Influenza B by PCR NEGATIVE NEGATIVE Final    Comment: (NOTE) The Xpert Xpress SARS-CoV-2/FLU/RSV plus assay is intended as an aid in the diagnosis of influenza from Nasopharyngeal swab specimens and should not be used as a sole basis for treatment. Nasal washings and aspirates are unacceptable for Xpert Xpress SARS-CoV-2/FLU/RSV testing.  Fact Sheet for Patients: BloggerCourse.com  Fact Sheet for Healthcare Providers: SeriousBroker.it  This test is not yet approved or cleared by the [place] FDA and has been authorized for detection and/or diagnosis of SARS-CoV-2 by FDA under an Emergency Use Authorization (EUA). This EUA will remain in effect (meaning this test can be used) for the duration of the COVID-19 declaration under Section 564(b)(1) of the Act, 21 U.S.C. section 360bbb-3(b)(1), unless the authorization is terminated  or revoked.  Performed at [facility], [address], [place], [place] [address]    Time coordinating discharge: 35 minutes  SIGNED:  Merlene Laughter, DO [facility] 09/12/2020, 12:25 PM Pager is on AMION  If 7PM-7AM, please contact night-coverage [email or web address]

## 2020-09-12 NOTE — Plan of Care (Signed)
  Problem: Activity: Goal: Capacity to carry out activities will improve Outcome: Progressing   Problem: Cardiac: Goal: Ability to achieve and maintain adequate cardiopulmonary perfusion will improve Outcome: Progressing   

## 2020-09-12 NOTE — TOC Transition Note (Signed)
Transition of Care [facility]) - CM/SW Discharge Note   Patient Details  Name: [name] MRN: [id number] Date of Birth: Feb 07, 1956  [facility]) CM/SW Contact:  Erin Sons, LCSW Phone Number: 09/12/2020, 12:09 PM   Clinical Narrative:     Patient will DC to: [facility] [place] Anticipated DC date: 09/13/19 Family notified: Pt to notify husband Transport by: Husband will transport  Per MD patient ready for DC to [facility]. RN, patient, patient's family, and facility notified of DC. Discharge Summary and FL2 will be sent to facility. RN to call report prior to discharge [phone]). Pt to d/c once orders and d/c summary are in.   CSW will sign off for now as social work intervention is no longer needed. Please consult [place] again if new needs arise.   Final next level of care: Skilled Nursing Facility Barriers to Discharge: No Barriers Identified   Patient Goals and CMS Choice Patient states their goals for this hospitalization and ongoing recovery are:: Improve physical ability to ambulate independently CMS Medicare.gov Compare Post Acute Care list provided to:: Patient Choice offered to / list presented to : Patient  Discharge Placement              Patient chooses bed at: [facility] [place] Patient to be transferred to facility by: [facility] Name of family member notified: Pt to notify husband Patient and family notified of of transfer: 09/12/20  Discharge Plan and Services                                     Social Determinants of Health (SDOH) Interventions     Readmission Risk Interventions No flowsheet data found.

## 2020-09-12 NOTE — Progress Notes (Signed)
Attempted to call report to nursing home x 2.

## 2020-09-12 NOTE — Progress Notes (Addendum)
Physical Therapy Treatment Patient Details Name: [name] MRN: [id number] DOB: 1956/07/04 Today's Date: 09/12/2020    History of Present Illness [name] is a 65 y.o. female with medical history significant of dCHF, chronic lymphedema of BLE L>R, CAD, AS s/p AVR with bioprosthetic, TIA, on chronic coumadin with goal INR 2-3.   Pt presents to the ED with worsening LLE erythema.  Onset 6 days ago, progressively worsening, now severe.  Nothing tried for symptoms, nothing makes better or worse. She was planning on coming to ER for symptoms, but had a slip off of bed earlier today, and got stuck between bed and wall for 4 hours.  RIGHT leg was stuck between bed and wall.  Now has RIGHT knee pain and erythema and edema.    PT Comments    Pt progressing toward her physical therapy goals; remains motivated to participate. Requiring moderate assist for transfers; ambulating 6 ft, then another 6 ft with a walker and chair follow. Able to participate in seated exercises for BLE strengthening. Pt continues with R ankle weakness, generalized weakness, balance deficits, gait abnormalities, balance deficits and decreased endurance. Continue to recommend SNF for ongoing Physical Therapy.      Follow Up Recommendations  SNF;Supervision/Assistance - 24 hour     Equipment Recommendations  None recommended by PT    Recommendations for Other Services       Precautions / Restrictions Precautions Precautions: Fall Precaution Comments: open and blistered areas to B LE's Restrictions Weight Bearing Restrictions: No    Mobility  Bed Mobility Overal bed mobility: Needs Assistance Bed Mobility: Supine to Sit     Supine to sit: Supervision     General bed mobility comments: Able to progress to edge of bed without physical assist,HOB elevated, use of bed rail  Transfers Overall transfer level: Needs assistance Equipment used: Rolling walker (2 wheeled) Transfers: Sit to/from Stand Sit to  Stand: Mod assist;+2 safety/equipment         General transfer comment: cues for rocking to gain momentum, hand/foot placement, modA to boost up to standing position from edge of bed, recliner and BSC  Ambulation/Gait Ambulation/Gait assistance: +2 safety/equipment;Min assist Gait Distance (Feet): 12 Feet (6", 6") Assistive device: Rolling walker (2 wheeled) Gait Pattern/deviations: Step-to pattern;Decreased step length - right;Decreased step length - left;Shuffle Gait velocity: decreased Gait velocity interpretation: <1.31 ft/sec, indicative of household ambulator General Gait Details: Mod cues for sequencing, upright posture, close chair follow utilized as pt fatigues easily. MinA for balance.   Stairs             Wheelchair Mobility    Modified Rankin (Stroke Patients Only)       Balance Overall balance assessment: Needs assistance Sitting-balance support: Feet supported Sitting balance-Leahy Scale: Good     Standing balance support: Bilateral upper extremity supported;During functional activity Standing balance-Leahy Scale: Poor                              Cognition Arousal/Alertness: Awake/alert Behavior During Therapy: WFL for tasks assessed/performed Overall Cognitive Status: Within Functional Limits for tasks assessed                                 General Comments: easily distracted with tangential speech      Exercises General Exercises - Lower Extremity Long Arc Quad: Both;10 reps;Seated Hip Flexion/Marching: Both;5 reps;Seated Other  Exercises Other Exercises: R towel curls x 10    General Comments        Pertinent Vitals/Pain Pain Assessment: Faces Faces Pain Scale: Hurts little more Pain Location: R LE, foot still pins and needles Pain Descriptors / Indicators: Pins and needles Pain Intervention(s): Monitored during session    Home Living                      Prior Function            PT  Goals (current goals can now be found in the care plan section) Acute Rehab PT Goals Patient Stated Goal: get stronger Potential to Achieve Goals: Fair Progress towards PT goals: Progressing toward goals    Frequency    Min 3X/week      PT Plan Current plan remains appropriate    Co-evaluation              AM-PAC PT "6 Clicks" Mobility   Outcome Measure  Help needed turning from your back to your side while in a flat bed without using bedrails?: None Help needed moving from lying on your back to sitting on the side of a flat bed without using bedrails?: None Help needed moving to and from a bed to a chair (including a wheelchair)?: A Little Help needed standing up from a chair using your arms (e.g., wheelchair or bedside chair)?: A Lot Help needed to walk in hospital room?: A Little Help needed climbing 3-5 steps with a railing? : Total 6 Click Score: 17    End of Session Equipment Utilized During Treatment: Gait belt Activity Tolerance: Patient limited by fatigue Patient left: in chair;with call bell/phone within reach Nurse Communication: Mobility status PT Visit Diagnosis: Unsteadiness on feet (R26.81);History of falling (Z91.81);Other abnormalities of gait and mobility (R26.89);Muscle weakness (generalized) (M62.81);Difficulty in walking, not elsewhere classified (R26.2);Pain Pain - Right/Left: Right Pain - part of body: Leg     Time: [id number] PT Time Calculation (min) (ACUTE ONLY): 41 min  Charges:  $Gait Training: 8-22 mins $Therapeutic Activity: 23-37 mins                     Wyona Almas, PT, DPT Acute Rehabilitation Services Pager [phone] Office [phone]    Deno Etienne 09/12/2020, 1:33 PM

## 2020-09-22 ENCOUNTER — Telehealth: Payer: Self-pay

## 2020-09-22 ENCOUNTER — Telehealth: Payer: Self-pay | Admitting: Cardiology

## 2020-09-22 NOTE — Telephone Encounter (Signed)
I spoke to the patient and rescheduled her for 10/03/20 in [place].  She verbalized understanding.

## 2020-09-22 NOTE — Telephone Encounter (Signed)
Patient is calling to cancel her appointment with the coumadin clinic on 09/26/20. She states she has been at [facility] and is scheduled to be released from their facility on 09/26/20. Patient also states she had her INR checked on 09/21/20. She reports it was high and she would like to drop off a copy of the results to the [place] office. She would like to know when may be a good time to do so. Please advise.

## 2020-09-22 NOTE — Telephone Encounter (Signed)
[email or web address] regarding recent INR.

## 2020-10-17 ENCOUNTER — Ambulatory Visit (INDEPENDENT_AMBULATORY_CARE_PROVIDER_SITE_OTHER): Payer: Medicare Other

## 2020-10-17 ENCOUNTER — Other Ambulatory Visit: Payer: Self-pay

## 2020-10-17 DIAGNOSIS — Z954 Presence of other heart-valve replacement: Secondary | ICD-10-CM

## 2020-10-17 DIAGNOSIS — G459 Transient cerebral ischemic attack, unspecified: Secondary | ICD-10-CM

## 2020-10-17 DIAGNOSIS — Z5181 Encounter for therapeutic drug level monitoring: Secondary | ICD-10-CM

## 2020-10-17 DIAGNOSIS — Z7901 Long term (current) use of anticoagulants: Secondary | ICD-10-CM | POA: Diagnosis not present

## 2020-10-17 LAB — POCT INR: INR: 1.9 — AB (ref 2.0–3.0)

## 2020-10-17 NOTE — Patient Instructions (Signed)
Take 0.5 tablet daily except 1 tablet on Monday, Wednesday and Friday.  Repeat INR in 4 weeks.

## 2020-10-19 ENCOUNTER — Other Ambulatory Visit: Payer: Self-pay | Admitting: Cardiology

## 2020-10-26 ENCOUNTER — Telehealth: Payer: Self-pay | Admitting: Cardiology

## 2020-10-26 NOTE — Telephone Encounter (Signed)
Renee with [facility] is requesting a copy of the patient's most recent office visit notes. Specifically, she states she is seeking written documentation stating the patient has heart failure. Office notes can be faxed to [phone].  If additional questions, please return call to Joseph Art to discuss at [phone].

## 2020-10-27 NOTE — Telephone Encounter (Signed)
Records faxed at this time

## 2020-11-03 ENCOUNTER — Telehealth: Payer: Self-pay | Admitting: Cardiology

## 2020-11-03 MED ORDER — METOPROLOL SUCCINATE ER 25 MG PO TB24: ORAL_TABLET | ORAL | 3 refills | Status: DC

## 2020-11-03 NOTE — Telephone Encounter (Signed)
Refill sent in per request.  

## 2020-11-03 NOTE — Telephone Encounter (Signed)
*  STAT* If patient is at the pharmacy, call can be transferred to refill team.   1. Which medications need to be refilled? (please list name of each medication and dose if known) Metoprolol  2. Which pharmacy/location (including street and city if local pharmacy) is medication to be sent to [facility]  3. Do they need a 30 day or 90 day supply? #270 and refills

## 2020-11-14 ENCOUNTER — Ambulatory Visit (INDEPENDENT_AMBULATORY_CARE_PROVIDER_SITE_OTHER): Payer: Medicare Other

## 2020-11-14 ENCOUNTER — Other Ambulatory Visit: Payer: Self-pay

## 2020-11-14 DIAGNOSIS — G459 Transient cerebral ischemic attack, unspecified: Secondary | ICD-10-CM

## 2020-11-14 DIAGNOSIS — Z954 Presence of other heart-valve replacement: Secondary | ICD-10-CM | POA: Diagnosis not present

## 2020-11-14 DIAGNOSIS — Z5181 Encounter for therapeutic drug level monitoring: Secondary | ICD-10-CM | POA: Diagnosis not present

## 2020-11-14 DIAGNOSIS — Z7901 Long term (current) use of anticoagulants: Secondary | ICD-10-CM | POA: Diagnosis not present

## 2020-11-14 LAB — POCT INR: INR: 3.1 — AB (ref 2.0–3.0)

## 2020-11-14 NOTE — Patient Instructions (Signed)
Take 0.5 tablet daily except 1 tablet on Monday, Wednesday and Friday.  Repeat INR in 6 weeks. Eat greens tonight.

## 2020-12-01 ENCOUNTER — Ambulatory Visit: Payer: Medicare Other | Admitting: Cardiology

## 2020-12-16 NOTE — Progress Notes (Signed)
Cardiology Office Note:    Date:  12/18/2020   ID:  [name], DOB [birth year], MRN [id number]  PCP:  [name], MD  Cardiologist:  Shirlee More, MD    Referring MD: [name], MD    ASSESSMENT:    1. H/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   2. Long term (current) use of anticoagulants   3. Hypertensive heart disease with heart failure (HCC)   4. Other iron deficiency anemia    PLAN:    In order of problems listed above:  1. Overall doing better continue warfarin with valve dysfunction and severe outflow tract obstruction heart failure presently compensated tolerating her anticoagulant without bleeding and blood pressure at target.  She will continue current medical regimen including her loop diuretic potassium beta-blocker and anticoagulant she relates recent labs with her PCP will call to get a copy of the results for renal function potassium 2. Improved iron is repleted hemoglobin normal   Next appointment: 6 months   Medication Adjustments/Labs and Tests Ordered: Current medicines are reviewed at length with the patient today.  Concerns regarding medicines are outlined above.  No orders of the defined types were placed in this encounter.  No orders of the defined types were placed in this encounter.   Chief Complaint  Patient presents with  . Follow-up    Bioprosthetic AVR with stent thrombosis now anticoagulated with warfarin  . Congestive Heart Failure    History of Present Illness:    [name] is a 65 y.o. female with a hx of severe symptomatic aortic stenosis of heart failure and bioprosthetic AVR #23 [place] 03/30/2013 Dr. Yancey Flemings at [facility], hypertensive heart disease with heart failure severe obstructive and central sleep apnea with nocturnal hypoxemia treated with BiPAP and nonhealing leg ulcers and dysfunctional uterine bleeding.  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  Her hospital telemetry showed frequent PACs and brief runs of atrial tachycardia she was also noted to have a patent foramen ovale. She follows with gynecology oncology and has had her iron stores repleted.   Compliance with diet, lifestyle and medications: Yes  Overall doing better Hemoglobin 12.4 checked with her OB oncologist [place] Compliant with her diuretic but complains of urinary incontinence and is to see GYN urology. Continued edema and lymphedema and is involved in the clinic in [place] and continues to have openings skin ulceration. No shortness of breath orthopnea chest pain palpitation or syncope and tolerates her anticoagulant without bleeding   Admitted to [facility] in December 2021 with serous due to lower extremity cellulitis requiring treatment with IV and oral antibiotics.  Her course was complicated by rhabdomyolysis initially abnormal liver function test improving heart failure initially holding diuretics hyponatremia with a serum sodium of 126 corrected to 136 and anemia with a discharge hemoglobin of 14.3. Past Medical History:  Diagnosis Date  . Anemia 06/03/2019  . Aortic stenosis 03/23/2013   Overview:  02/18/13 TTE EF >55%. Critical AS with mean Ao valve gradient of 82 mm Hg. No AI. No MR, PR, mild TR. Estimated RVSP 30 mm Hg.  . Bicuspid aortic valve   . CAD (coronary  artery disease) 06/03/2019  . Chronic diastolic CHF (congestive heart failure) ([place] 06/03/2019  . Dysfunctional uterine bleeding 04/22/2018  . Edema of both legs 02/20/2017  . Encounter for insertion of mirena IUD 08/17/2019   Mirena IUD / 52mg  Levonorgestrel  Insertion Date: 08/17/19 S/N: [id number] Exp: 1/23 Lot: [id number]  . Essential hypertension 06/03/2019  . 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 Heart failure ([place])   . HTN (hypertension) 03/23/2013  . 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 Hypertensive heart disease with heart failure ([place]) 03/23/2013  . Iron deficiency anemia due to chronic blood loss 10/19/2019  . Long term (current) use of anticoagulants 06/11/2019  . Morbid obesity ([place]) 02/20/2017  . Obstructive hypertrophic cardiomyopathy ([place]) 07/10/2018  . Postmenopausal bleeding 04/16/2018   Added automatically from request for surgery [id number]  Last Assessment & Plan:  1. Postmenopausal bleeding since for the past 2 years.  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 Last biopsy was in May of 2019 which showed weakly proliferative endometrium possibly a polyp.    . Sleep apnea   . TIA (transient ischemic attack) 06/04/2019  . Transient neurologic deficit 06/03/2019    Past Surgical History:  Procedure Laterality Date  . BUBBLE STUDY  06/07/2019   Procedure: BUBBLE STUDY;  Surgeon: Buford Dresser, MD;  Location: [place];  Service: Cardiovascular;;  . CHOLECYSTECTOMY  1983  . RIGHT/LEFT HEART CATH AND CORONARY ANGIOGRAPHY N/A 04/01/2019   Procedure: RIGHT/LEFT HEART CATH AND CORONARY ANGIOGRAPHY;  Surgeon: Lorretta Harp, MD;  Location: [place] CV LAB;  Service: Cardiovascular;  Laterality: N/A;  . TEE WITHOUT CARDIOVERSION N/A 07/01/2018   Procedure: TRANSESOPHAGEAL ECHOCARDIOGRAM (TEE);  Surgeon: Buford Dresser, MD;  Location: [facility] ENDOSCOPY;  Service: Cardiovascular;  Laterality: N/A;  . TEE WITHOUT CARDIOVERSION N/A 06/07/2019   Procedure: TRANSESOPHAGEAL ECHOCARDIOGRAM (TEE);  Surgeon: Buford Dresser, MD;  Location: [facility] ENDOSCOPY;  Service: Cardiovascular;  Laterality: N/A;  .  TISSUE AORTIC VALVE REPLACEMENT      Current Medications: Current Meds  Medication Sig  . acetaminophen (TYLENOL) 325 MG tablet Take 2 tablets (650 mg total) by mouth every 6 (six) hours as needed for mild pain or headache.  Marland Kitchen amoxicillin (AMOXIL) 500 MG capsule Take 2,000 mg by mouth See admin instructions. 1 hour prior to dental appointment  . camphor-menthol (SARNA) lotion Apply 1 application topically as needed for itching.  . ferrous sulfate 324 MG TBEC Take 324 mg by mouth.  . hydrOXYzine (ATARAX/VISTARIL) 50 MG tablet Take 1 tablet (50 mg total) by mouth 3 (three) times daily as needed for itching.  Marland Kitchen levonorgestrel (MIRENA) 20 MCG/24HR IUD 1 each by Intrauterine route once.  . metoprolol succinate (TOPROL XL) 25 MG 24 hr tablet Take 2 tablets (50 mg total) by mouth in the morning AND 1 tablet (25 mg total) every evening.  Marland Kitchen oxybutynin (DITROPAN-XL) 5 MG 24 hr tablet Take 5 mg by mouth daily.  . potassium chloride (KLOR-CON) 10 MEQ tablet Take 10 mEq by mouth 2 (two) times daily.  . pravastatin (PRAVACHOL) 40 MG tablet Take 1 tablet (40 mg total) by mouth daily at 6 PM.  . torsemide (DEMADEX) 20 MG tablet Take 3 tablets (60 mg total) by mouth daily.  Marland Kitchen warfarin (COUMADIN) 2.5 MG tablet Take 1 tablet by mouth once daily     Allergies:   Vancomycin   Social History  Socioeconomic History  . Marital status: Married    Spouse name: Not on file  . Number of children: Not on file  . Years of education: Not on file  . Highest education level: Not on file  Occupational History  . Not on file  Tobacco Use  . Smoking status: Never Smoker  . Smokeless tobacco: Never Used  Vaping Use  . Vaping Use: Never used  Substance and Sexual Activity  . Alcohol use: No  . Drug use: No  . Sexual activity: Not Currently  Other Topics Concern  . Not on file  Social History Narrative  . Not on file   Social Determinants of Health   Financial Resource Strain: Not on file  Food  Insecurity: Not on file  Transportation Needs: Not on file  Physical Activity: Not on file  Stress: Not on file  Social Connections: Not on file     Family History: The patient's family history includes Heart attack in her mother; Parkinson's disease in her father. ROS:   Please see the history of present illness.    All other systems reviewed and are negative.  EKGs/Labs/Other Studies Reviewed:    The following studies were reviewed today:   Recent Labs: 06/06/2020: NT-Pro BNP 1,079 09/12/2020: ALT 31; BUN 19; Creatinine, Ser 0.85; Hemoglobin 14.3; Magnesium 1.8; Platelets 370; Potassium 3.7; Sodium 136  Recent Lipid Panel    Component Value Date/Time   CHOL 123 06/04/2019 0507   CHOL 125 02/12/2019 1116   TRIG 111 06/04/2019 0507   HDL 30 (L) 06/04/2019 0507   HDL 37 (L) 02/12/2019 1116   CHOLHDL 4.1 06/04/2019 0507   VLDL 22 06/04/2019 0507   LDLCALC 71 06/04/2019 0507   LDLCALC 64 02/12/2019 1116    Physical Exam:    VS:  BP 118/78 (BP Location: Left Wrist, Patient Position: Sitting)   Pulse 96   Ht 5\' 1"  (1.549 m)   Wt 265 lb (120.2 kg)   SpO2 95%   BMI 50.07 kg/m     Wt Readings from Last 3 Encounters:  12/18/20 265 lb (120.2 kg)  09/12/20 259 lb 0.7 oz (117.5 kg)  06/06/20 271 lb 3.2 oz (123 kg)     GEN: Obese well nourished, well developed in no acute distress HEENT: Normal NECK: No JVD; No carotid bruits LYMPHATICS: No lymphadenopathy CARDIAC: Very unimpressive grade 1/6 systolic ejection murmur much decreased in intensity no radiation to the carotids as to as normal RRR, no murmurs, rubs, gallops RESPIRATORY:  Clear to auscultation without rales, wheezing or rhonchi  ABDOMEN: Soft, non-tender, non-distended MUSCULOSKELETAL:  No edema; No deformity  SKIN: Warm and dry NEUROLOGIC:  Alert and oriented x 3 PSYCHIATRIC:  Normal affect    Signed, Shirlee More, MD  12/18/2020 4:33 PM    [place]

## 2020-12-18 ENCOUNTER — Other Ambulatory Visit: Payer: Self-pay

## 2020-12-18 ENCOUNTER — Ambulatory Visit (INDEPENDENT_AMBULATORY_CARE_PROVIDER_SITE_OTHER): Payer: Medicare Other | Admitting: Cardiology

## 2020-12-18 VITALS — BP 118/78 | HR 96 | Ht 61.0 in | Wt 265.0 lb

## 2020-12-18 DIAGNOSIS — D508 Other iron deficiency anemias: Secondary | ICD-10-CM

## 2020-12-18 DIAGNOSIS — Z7901 Long term (current) use of anticoagulants: Secondary | ICD-10-CM | POA: Diagnosis not present

## 2020-12-18 DIAGNOSIS — Z954 Presence of other heart-valve replacement: Secondary | ICD-10-CM | POA: Diagnosis not present

## 2020-12-18 DIAGNOSIS — I110 Hypertensive heart disease with heart failure: Secondary | ICD-10-CM

## 2020-12-18 NOTE — Patient Instructions (Signed)

## 2020-12-26 ENCOUNTER — Other Ambulatory Visit: Payer: Self-pay

## 2020-12-26 ENCOUNTER — Ambulatory Visit (INDEPENDENT_AMBULATORY_CARE_PROVIDER_SITE_OTHER): Payer: Medicare Other

## 2020-12-26 DIAGNOSIS — Z5181 Encounter for therapeutic drug level monitoring: Secondary | ICD-10-CM | POA: Diagnosis not present

## 2020-12-26 DIAGNOSIS — G459 Transient cerebral ischemic attack, unspecified: Secondary | ICD-10-CM | POA: Diagnosis not present

## 2020-12-26 DIAGNOSIS — Z954 Presence of other heart-valve replacement: Secondary | ICD-10-CM

## 2020-12-26 DIAGNOSIS — Z7901 Long term (current) use of anticoagulants: Secondary | ICD-10-CM

## 2020-12-26 LAB — POCT INR: INR: 2 (ref 2.0–3.0)

## 2020-12-26 NOTE — Patient Instructions (Signed)
Take 0.5 tablet daily except 1 tablet on Monday, Wednesday and Friday.  Repeat INR in 6 weeks

## 2021-02-13 ENCOUNTER — Other Ambulatory Visit: Payer: Self-pay

## 2021-02-13 ENCOUNTER — Other Ambulatory Visit: Payer: Self-pay | Admitting: Cardiology

## 2021-02-13 ENCOUNTER — Ambulatory Visit (INDEPENDENT_AMBULATORY_CARE_PROVIDER_SITE_OTHER): Payer: Medicare Other

## 2021-02-13 DIAGNOSIS — Z954 Presence of other heart-valve replacement: Secondary | ICD-10-CM | POA: Diagnosis not present

## 2021-02-13 DIAGNOSIS — Z7901 Long term (current) use of anticoagulants: Secondary | ICD-10-CM

## 2021-02-13 DIAGNOSIS — Z5181 Encounter for therapeutic drug level monitoring: Secondary | ICD-10-CM

## 2021-02-13 DIAGNOSIS — G459 Transient cerebral ischemic attack, unspecified: Secondary | ICD-10-CM | POA: Diagnosis not present

## 2021-02-13 LAB — POCT INR: INR: 1.1 — AB (ref 2.0–3.0)

## 2021-02-13 NOTE — Patient Instructions (Signed)
Take 2 tablets tonight only and then continue taking 0.5 tablet daily except 1 tablet on Monday, Wednesday and Friday.  Repeat INR in 6 weeks.

## 2021-03-13 ENCOUNTER — Ambulatory Visit (INDEPENDENT_AMBULATORY_CARE_PROVIDER_SITE_OTHER): Payer: Medicare Other

## 2021-03-13 ENCOUNTER — Other Ambulatory Visit: Payer: Self-pay

## 2021-03-13 DIAGNOSIS — Z7901 Long term (current) use of anticoagulants: Secondary | ICD-10-CM

## 2021-03-13 DIAGNOSIS — Z954 Presence of other heart-valve replacement: Secondary | ICD-10-CM

## 2021-03-13 DIAGNOSIS — Z5181 Encounter for therapeutic drug level monitoring: Secondary | ICD-10-CM

## 2021-03-13 DIAGNOSIS — G459 Transient cerebral ischemic attack, unspecified: Secondary | ICD-10-CM

## 2021-03-13 LAB — POCT INR: INR: 7.8 — AB (ref 2.0–3.0)

## 2021-03-13 NOTE — Patient Instructions (Signed)
Hold Tuesday, Wednesday, Thursday and Friday and then continue taking 0.5 tablet daily except 1 tablet on Monday, Wednesday and Friday.  Repeat INR in 1 week.

## 2021-03-20 ENCOUNTER — Ambulatory Visit (INDEPENDENT_AMBULATORY_CARE_PROVIDER_SITE_OTHER): Payer: Medicare Other

## 2021-03-20 ENCOUNTER — Other Ambulatory Visit: Payer: Self-pay

## 2021-03-20 DIAGNOSIS — G459 Transient cerebral ischemic attack, unspecified: Secondary | ICD-10-CM | POA: Diagnosis not present

## 2021-03-20 DIAGNOSIS — Z5181 Encounter for therapeutic drug level monitoring: Secondary | ICD-10-CM | POA: Diagnosis not present

## 2021-03-20 DIAGNOSIS — Z7901 Long term (current) use of anticoagulants: Secondary | ICD-10-CM | POA: Diagnosis not present

## 2021-03-20 DIAGNOSIS — Z954 Presence of other heart-valve replacement: Secondary | ICD-10-CM

## 2021-03-20 LAB — POCT INR: INR: 7.8 — AB (ref 2.0–3.0)

## 2021-03-20 NOTE — Patient Instructions (Signed)
Hold Tuesday, Wednesday, Thursday, Friday and Saturday,  then continue taking 0.5 tablet daily except 1 tablet on Monday, Wednesday and Friday.  Repeat INR in 1 week.  Tuesday, Thursday and Saturday eat greens.

## 2021-03-22 ENCOUNTER — Other Ambulatory Visit: Payer: Self-pay

## 2021-03-22 ENCOUNTER — Emergency Department (HOSPITAL_COMMUNITY): Payer: Medicare Other

## 2021-03-22 ENCOUNTER — Inpatient Hospital Stay (HOSPITAL_COMMUNITY)
Admission: EM | Admit: 2021-03-22 | Discharge: 2021-04-02 | DRG: 312 | Disposition: A | Discharge status: Home Health | Payer: Medicare Other | Attending: Internal Medicine | Admitting: Internal Medicine

## 2021-03-22 ENCOUNTER — Telehealth: Payer: Self-pay | Admitting: Cardiology

## 2021-03-22 DIAGNOSIS — Z881 Allergy status to other antibiotic agents status: Secondary | ICD-10-CM

## 2021-03-22 DIAGNOSIS — R627 Adult failure to thrive: Secondary | ICD-10-CM | POA: Diagnosis present

## 2021-03-22 DIAGNOSIS — D751 Secondary polycythemia: Secondary | ICD-10-CM | POA: Diagnosis present

## 2021-03-22 DIAGNOSIS — R52 Pain, unspecified: Secondary | ICD-10-CM

## 2021-03-22 DIAGNOSIS — I951 Orthostatic hypotension: Secondary | ICD-10-CM | POA: Diagnosis not present

## 2021-03-22 DIAGNOSIS — S7012XA Contusion of left thigh, initial encounter: Secondary | ICD-10-CM | POA: Diagnosis present

## 2021-03-22 DIAGNOSIS — R531 Weakness: Secondary | ICD-10-CM

## 2021-03-22 DIAGNOSIS — E785 Hyperlipidemia, unspecified: Secondary | ICD-10-CM | POA: Diagnosis present

## 2021-03-22 DIAGNOSIS — R609 Edema, unspecified: Secondary | ICD-10-CM

## 2021-03-22 DIAGNOSIS — E860 Dehydration: Secondary | ICD-10-CM

## 2021-03-22 DIAGNOSIS — Z8673 Personal history of transient ischemic attack (TIA), and cerebral infarction without residual deficits: Secondary | ICD-10-CM

## 2021-03-22 DIAGNOSIS — N182 Chronic kidney disease, stage 2 (mild): Secondary | ICD-10-CM | POA: Diagnosis present

## 2021-03-22 DIAGNOSIS — Z7901 Long term (current) use of anticoagulants: Secondary | ICD-10-CM

## 2021-03-22 DIAGNOSIS — R791 Abnormal coagulation profile: Secondary | ICD-10-CM

## 2021-03-22 DIAGNOSIS — F32A Depression, unspecified: Secondary | ICD-10-CM | POA: Diagnosis not present

## 2021-03-22 DIAGNOSIS — I130 Hypertensive heart and chronic kidney disease with heart failure and stage 1 through stage 4 chronic kidney disease, or unspecified chronic kidney disease: Secondary | ICD-10-CM | POA: Diagnosis present

## 2021-03-22 DIAGNOSIS — N390 Urinary tract infection, site not specified: Secondary | ICD-10-CM

## 2021-03-22 DIAGNOSIS — Z954 Presence of other heart-valve replacement: Secondary | ICD-10-CM

## 2021-03-22 DIAGNOSIS — S8002XA Contusion of left knee, initial encounter: Secondary | ICD-10-CM | POA: Diagnosis present

## 2021-03-22 DIAGNOSIS — W19XXXA Unspecified fall, initial encounter: Secondary | ICD-10-CM | POA: Diagnosis present

## 2021-03-22 DIAGNOSIS — I959 Hypotension, unspecified: Secondary | ICD-10-CM | POA: Diagnosis present

## 2021-03-22 DIAGNOSIS — Z79899 Other long term (current) drug therapy: Secondary | ICD-10-CM

## 2021-03-22 DIAGNOSIS — T148XXA Other injury of unspecified body region, initial encounter: Secondary | ICD-10-CM

## 2021-03-22 DIAGNOSIS — G4730 Sleep apnea, unspecified: Secondary | ICD-10-CM | POA: Diagnosis present

## 2021-03-22 DIAGNOSIS — N179 Acute kidney failure, unspecified: Secondary | ICD-10-CM

## 2021-03-22 DIAGNOSIS — I5032 Chronic diastolic (congestive) heart failure: Secondary | ICD-10-CM | POA: Diagnosis not present

## 2021-03-22 DIAGNOSIS — Z8249 Family history of ischemic heart disease and other diseases of the circulatory system: Secondary | ICD-10-CM

## 2021-03-22 DIAGNOSIS — E611 Iron deficiency: Secondary | ICD-10-CM | POA: Diagnosis present

## 2021-03-22 DIAGNOSIS — D631 Anemia in chronic kidney disease: Secondary | ICD-10-CM | POA: Diagnosis present

## 2021-03-22 DIAGNOSIS — M1612 Unilateral primary osteoarthritis, left hip: Secondary | ICD-10-CM | POA: Diagnosis present

## 2021-03-22 DIAGNOSIS — E861 Hypovolemia: Secondary | ICD-10-CM

## 2021-03-22 DIAGNOSIS — Z9181 History of falling: Secondary | ICD-10-CM

## 2021-03-22 DIAGNOSIS — Z20822 Contact with and (suspected) exposure to covid-19: Secondary | ICD-10-CM | POA: Diagnosis present

## 2021-03-22 DIAGNOSIS — I9589 Other hypotension: Secondary | ICD-10-CM

## 2021-03-22 DIAGNOSIS — R443 Hallucinations, unspecified: Secondary | ICD-10-CM

## 2021-03-22 DIAGNOSIS — I890 Lymphedema, not elsewhere classified: Secondary | ICD-10-CM | POA: Diagnosis present

## 2021-03-22 DIAGNOSIS — G4733 Obstructive sleep apnea (adult) (pediatric): Secondary | ICD-10-CM | POA: Diagnosis present

## 2021-03-22 DIAGNOSIS — I2510 Atherosclerotic heart disease of native coronary artery without angina pectoris: Secondary | ICD-10-CM | POA: Diagnosis present

## 2021-03-22 DIAGNOSIS — Z6841 Body Mass Index (BMI) 40.0 and over, adult: Secondary | ICD-10-CM

## 2021-03-22 DIAGNOSIS — I421 Obstructive hypertrophic cardiomyopathy: Secondary | ICD-10-CM | POA: Diagnosis present

## 2021-03-22 DIAGNOSIS — Z953 Presence of xenogenic heart valve: Secondary | ICD-10-CM

## 2021-03-22 DIAGNOSIS — E876 Hypokalemia: Secondary | ICD-10-CM | POA: Diagnosis present

## 2021-03-22 DIAGNOSIS — T502X5A Adverse effect of carbonic-anhydrase inhibitors, benzothiadiazides and other diuretics, initial encounter: Secondary | ICD-10-CM | POA: Diagnosis present

## 2021-03-22 LAB — URINALYSIS, ROUTINE W REFLEX MICROSCOPIC
Bilirubin Urine: NEGATIVE
Glucose, UA: NEGATIVE mg/dL
Hgb urine dipstick: NEGATIVE
Ketones, ur: NEGATIVE mg/dL
Nitrite: NEGATIVE
Protein, ur: 100 mg/dL — AB
Specific Gravity, Urine: 1.016 (ref 1.005–1.030)
pH: 5 (ref 5.0–8.0)

## 2021-03-22 LAB — CBG MONITORING, ED: Glucose-Capillary: 173 mg/dL — ABNORMAL HIGH (ref 70–99)

## 2021-03-22 LAB — CBC WITH DIFFERENTIAL/PLATELET
Abs Immature Granulocytes: 0.12 10*3/uL — ABNORMAL HIGH (ref 0.00–0.07)
Basophils Absolute: 0.1 10*3/uL (ref 0.0–0.1)
Basophils Relative: 1 %
Eosinophils Absolute: 0.1 10*3/uL (ref 0.0–0.5)
Eosinophils Relative: 0 %
HCT: 53.4 % — ABNORMAL HIGH (ref 36.0–46.0)
Hemoglobin: 17.1 g/dL — ABNORMAL HIGH (ref 12.0–15.0)
Immature Granulocytes: 1 %
Lymphocytes Relative: 9 %
Lymphs Abs: 1.8 10*3/uL (ref 0.7–4.0)
MCH: 27.9 pg (ref 26.0–34.0)
MCHC: 32 g/dL (ref 30.0–36.0)
MCV: 87 fL (ref 80.0–100.0)
Monocytes Absolute: 1.4 10*3/uL — ABNORMAL HIGH (ref 0.1–1.0)
Monocytes Relative: 7 %
Neutro Abs: 16.9 10*3/uL — ABNORMAL HIGH (ref 1.7–7.7)
Neutrophils Relative %: 82 %
Platelets: 257 10*3/uL (ref 150–400)
RBC: 6.14 MIL/uL — ABNORMAL HIGH (ref 3.87–5.11)
RDW: 14.4 % (ref 11.5–15.5)
WBC: 20.3 10*3/uL — ABNORMAL HIGH (ref 4.0–10.5)
nRBC: 0 % (ref 0.0–0.2)

## 2021-03-22 LAB — COMPREHENSIVE METABOLIC PANEL
ALT: 17 U/L (ref 0–44)
AST: 25 U/L (ref 15–41)
Albumin: 3.5 g/dL (ref 3.5–5.0)
Alkaline Phosphatase: 64 U/L (ref 38–126)
Anion gap: 14 (ref 5–15)
BUN: 35 mg/dL — ABNORMAL HIGH (ref 8–23)
CO2: 24 mmol/L (ref 22–32)
Calcium: 9.7 mg/dL (ref 8.9–10.3)
Chloride: 96 mmol/L — ABNORMAL LOW (ref 98–111)
Creatinine, Ser: 2.01 mg/dL — ABNORMAL HIGH (ref 0.44–1.00)
GFR, Estimated: 27 mL/min — ABNORMAL LOW (ref >60)
Glucose, Bld: 146 mg/dL — ABNORMAL HIGH (ref 70–99)
Potassium: 3.2 mmol/L — ABNORMAL LOW (ref 3.5–5.1)
Sodium: 134 mmol/L — ABNORMAL LOW (ref 135–145)
Total Bilirubin: 1 mg/dL (ref 0.3–1.2)
Total Protein: 7.1 g/dL (ref 6.5–8.1)

## 2021-03-22 LAB — TROPONIN I (HIGH SENSITIVITY)
Troponin I (High Sensitivity): 53 ng/L — ABNORMAL HIGH (ref <18)
Troponin I (High Sensitivity): 52 ng/L — ABNORMAL HIGH (ref <18)

## 2021-03-22 LAB — PROTIME-INR
INR: 6.4 (ref 0.8–1.2)
Prothrombin Time: 55.9 seconds — ABNORMAL HIGH (ref 11.4–15.2)

## 2021-03-22 LAB — APTT: aPTT: 75 seconds — ABNORMAL HIGH (ref 24–36)

## 2021-03-22 LAB — LIPASE, BLOOD: Lipase: 24 U/L (ref 11–51)

## 2021-03-22 IMAGING — CT CT HEAD W/O CM
4 series · 16 of 47 positions shown, 18 images · non-contrast
Comparison: [DATE] [DATE] [DATE] and MRI [DATE]

CLINICAL DATA: Near syncope.

EXAM:
CT HEAD WITHOUT CONTRAST
TECHNIQUE: Contiguous axial images were obtained from the base of the skull
through the vertex without intravenous contrast.

[Series 3: head without · axial · non-contrast · 0.44mm/px · z∈[-145,-25]mm · 7 of 33 slices shown, 9 images]
[im 5/33  brain]
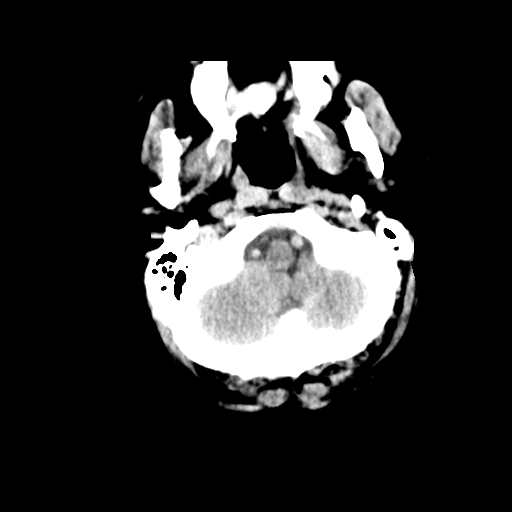
[im 5/33  bone]
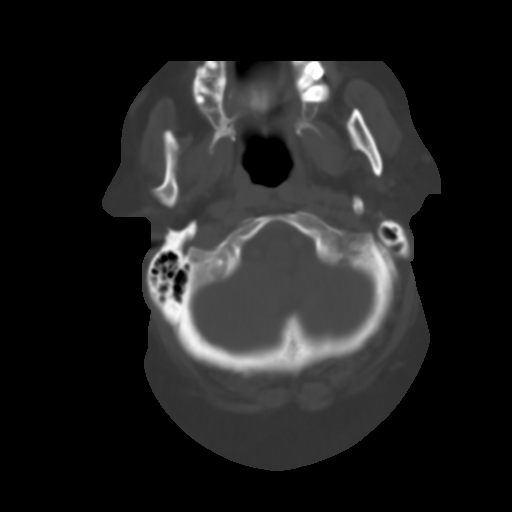
[im 9/33  brain]
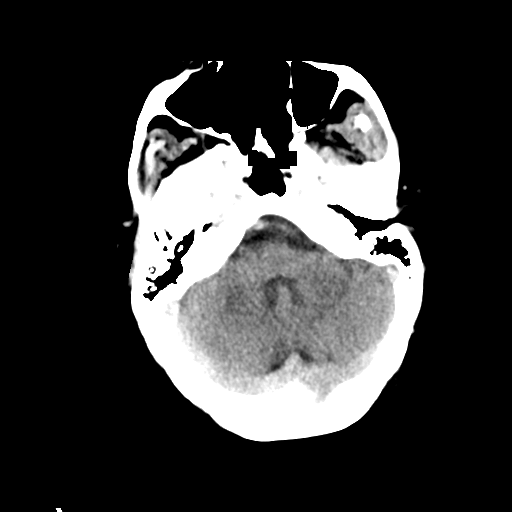
[im 13/33  brain]
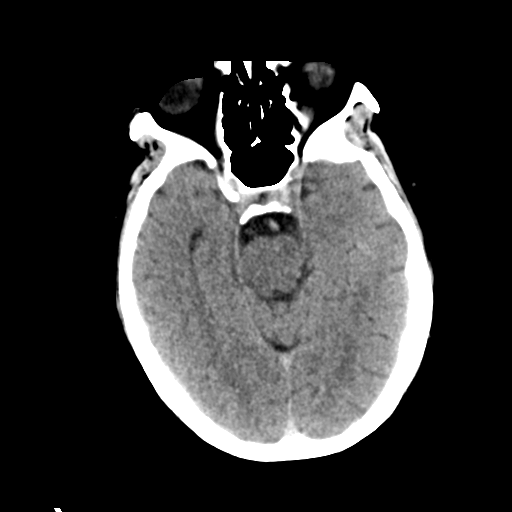
[im 17/33  brain]
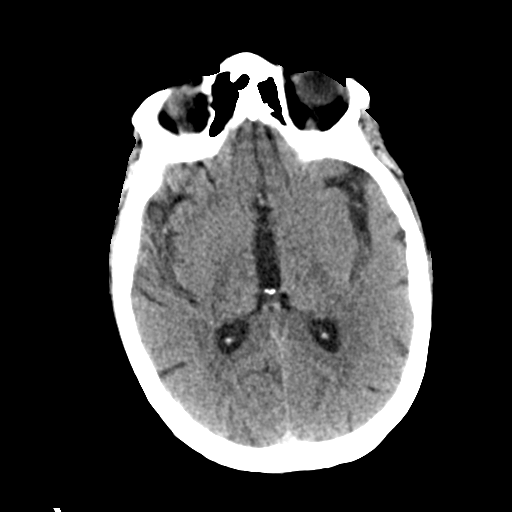
[im 21/33  brain]
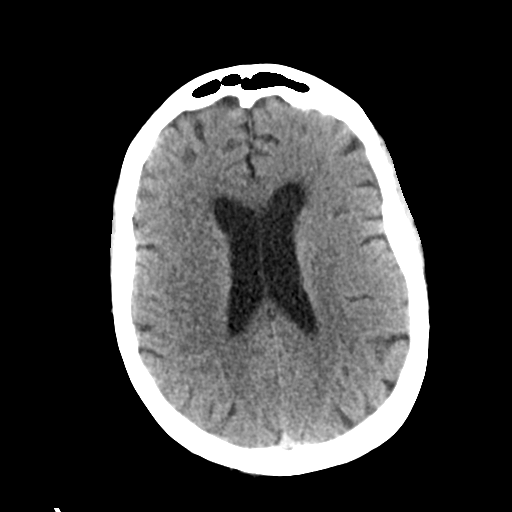
[im 21/33  bone]
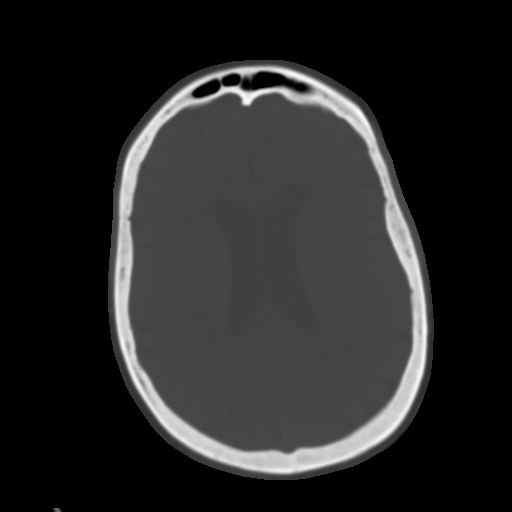
[im 25/33  brain]
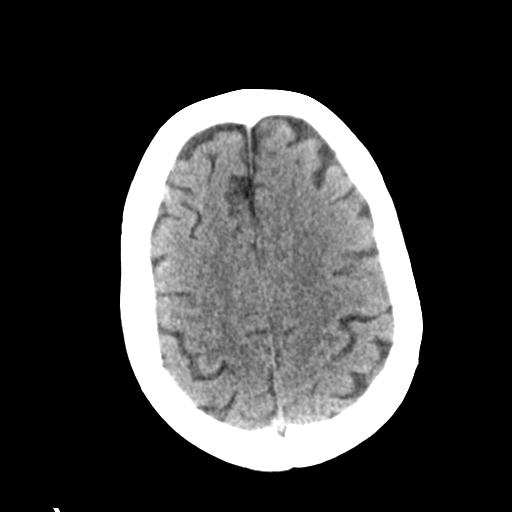
[im 29/33  brain]
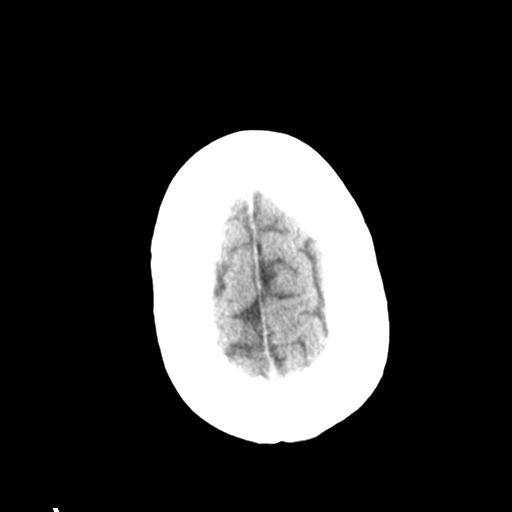

[Series 4: head bone · axial · 0.44mm/px · z∈[-149,-117]mm · 3 of 82 slices shown]
[im 9/82  bone]
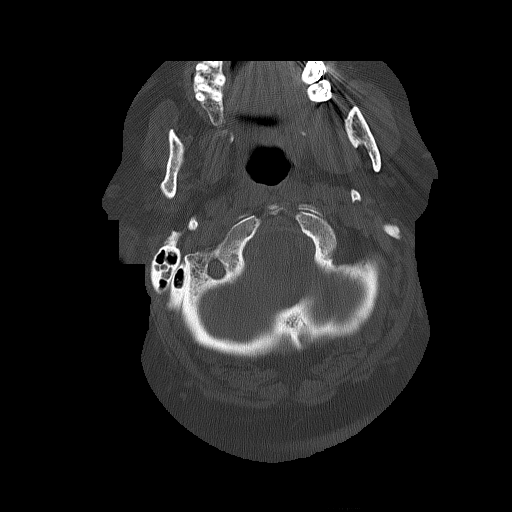
[im 17/82  bone]
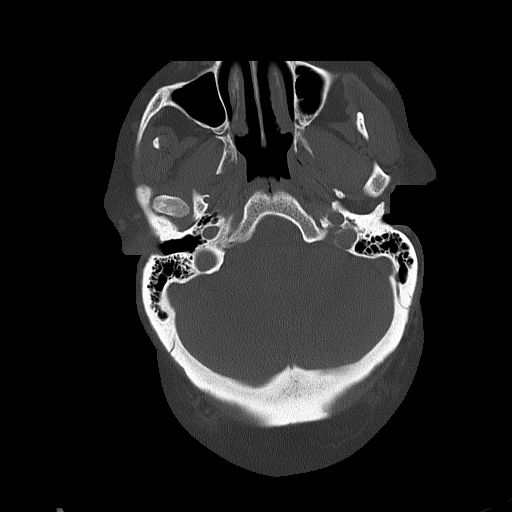
[im 25/82  bone]
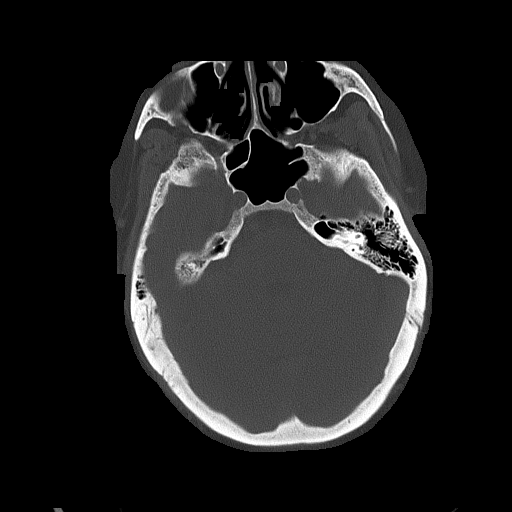

[Series 5: head without cor · coronal · non-contrast · 0.34mm/px · 3 of 78 slices shown]
[im 26/78  brain]
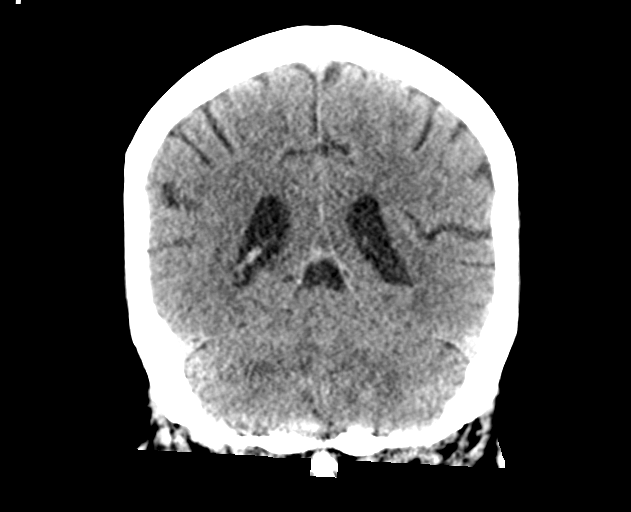
[im 35/78  brain]
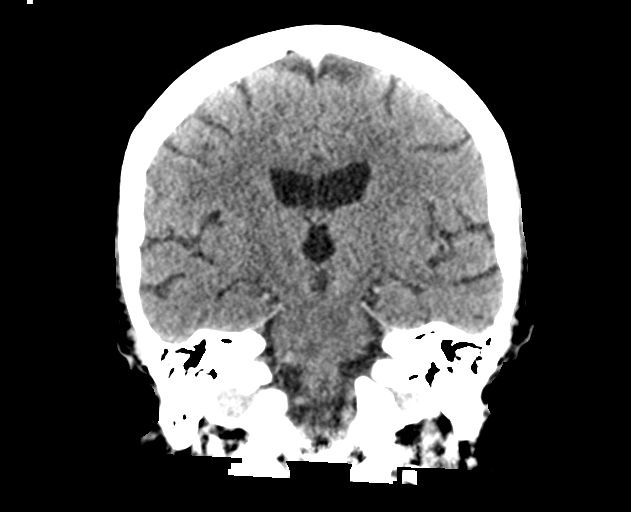
[im 43/78  brain]
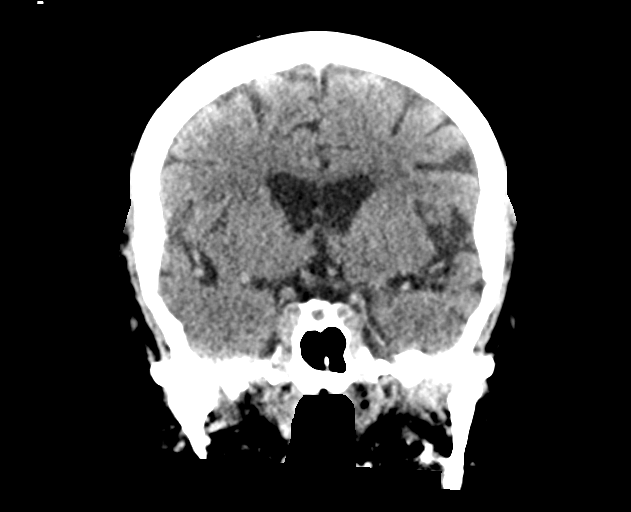

[Series 6: head without sag · sagittal · non-contrast · 0.34mm/px · 3 of 60 slices shown]
[im 20/60  brain]
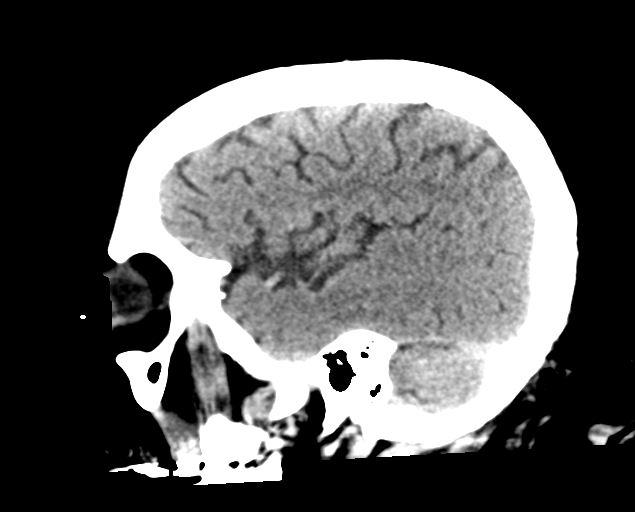
[im 30/60  brain]
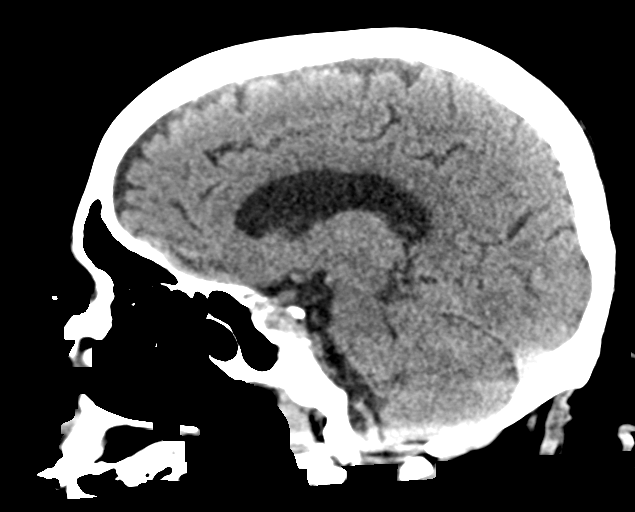
[im 40/60  brain]
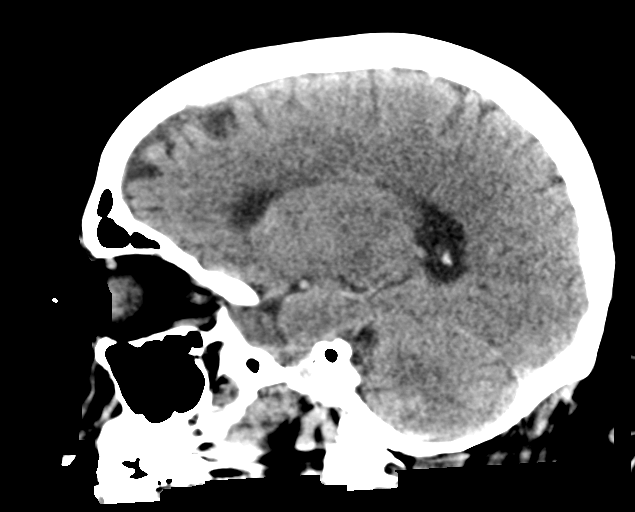

[16 of 47 positions shown; findings below may reference images not displayed]

FINDINGS: Brain: No evidence of acute large vascular territory infarction,
hemorrhage, hydrocephalus, extra-axial collection or mass
lesion/mass effect.

Vascular: No hyperdense vessel or unexpected calcification.

Skull: Normal. Negative for fracture or focal lesion.

Sinuses/Orbits: The visualized portions of the paranasal sinuses and
mastoid air cells are predominantly clear. Orbits are grossly
unremarkable.

Other: Streak artifact from dental hardware.
IMPRESSION: No acute intracranial findings.

## 2021-03-22 MED ORDER — SODIUM CHLORIDE 0.9 % IV BOLUS
500.0000 mL | Freq: Once | INTRAVENOUS | Status: AC
  Administered 2021-03-22: 500 mL via INTRAVENOUS

## 2021-03-22 MED ORDER — SODIUM CHLORIDE 0.9 % IV BOLUS
1000.0000 mL | Freq: Once | INTRAVENOUS | Status: AC
  Administered 2021-03-22: 1000 mL via INTRAVENOUS

## 2021-03-22 MED ORDER — SODIUM CHLORIDE 0.9% FLUSH
3.0000 mL | Freq: Two times a day (BID) | INTRAVENOUS | Status: DC
  Administered 2021-03-22 – 2021-04-02 (×17): 3 mL via INTRAVENOUS

## 2021-03-22 NOTE — ED Provider Notes (Signed)
[facility] EMERGENCY DEPARTMENT Provider Note   CSN: [id number] Arrival date & time: 03/22/21  1811     History Chief Complaint  Patient presents with   Near Syncope    [name] is a 65 y.o. female with a past medical history of CAD, aortic valve replacement currently on Coumadin, hypertension, anemia, obesity presenting to the ED with a chief complaint of near syncope.  For the past 2 days states that she has had 3 episodes of "my legs giving out on me."  She has landed on her tailbone but has not hit her head or lost consciousness.  She has preceding "feeling like there is something wrong in my head, and then I can feel my legs giving out."  She has been able to ambulate shortly thereafter.  Denies any headache, vision changes, chest pain, abdominal pain.  Had 1 episode of nonbloody, nonbilious emesis today.  She was told last week that her INR was greater than 7 and was told to hold her Coumadin for 5 days.  She did so and then when she had a recheck 2 days ago told her that it was still greater than 7.  She denies any active bleeding but has had intermittent nosebleeds since 2 days ago.  Denies any bloody stools.  Denies any numbness, paresthesias or changes to speech  HPI     Past Medical History:  Diagnosis Date   Anemia 06/03/2019   Aortic stenosis 03/23/2013   Overview:  02/18/13 TTE EF >55%. Critical AS with mean Ao valve gradient of 82 mm Hg. No AI. No MR, PR, mild TR. Estimated RVSP 30 mm Hg.   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 Postmenopausal bleeding since for the past 2 years.  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 Last biopsy was in May of 2019 which showed weakly proliferative endometrium possibly a polyp.     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    Patient Active Problem List   Diagnosis Date Noted   Sepsis due to cellulitis ([place]) 09/06/2020   Heart failure ([place])    Bicuspid aortic valve    Iron deficiency anemia due to chronic blood loss 10/19/2019   Insomnia 10/07/2019   Encounter for insertion of mirena IUD 08/17/2019   Long term (current) use of anticoagulants 06/11/2019   TIA (transient ischemic attack) 06/04/2019   Transient neurologic deficit 06/03/2019   Anemia 06/03/2019   Chronic diastolic CHF (  congestive heart failure) ([place]) 06/03/2019   CAD (coronary artery disease) 06/03/2019   Essential hypertension 06/03/2019   Avulsed toenail, initial encounter 05/27/2019   Infection of toenail 05/27/2019   Sleep apnea 09/25/2018   Obstructive hypertrophic cardiomyopathy ([place]) 07/10/2018   Dysfunctional uterine bleeding 04/22/2018   Postmenopausal bleeding 04/16/2018   Edema of both legs 02/20/2017   H/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 02/20/2017   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   Morbid obesity ([place]) 02/20/2017   Aortic stenosis 03/23/2013    Hypertensive heart disease with heart failure ([place]) 03/23/2013   HTN (hypertension) 03/23/2013    Past Surgical History:  Procedure Laterality Date   BUBBLE STUDY  06/07/2019   Procedure: BUBBLE STUDY;  Surgeon: Buford Dresser, MD;  Location: [facility];  Service: Cardiovascular;;   CHOLECYSTECTOMY  1983   RIGHT/LEFT HEART CATH AND CORONARY ANGIOGRAPHY N/A 04/01/2019   Procedure: RIGHT/LEFT HEART CATH AND CORONARY ANGIOGRAPHY;  Surgeon: Lorretta Harp, MD;  Location: [facility];  Service: Cardiovascular;  Laterality: N/A;   TEE WITHOUT CARDIOVERSION N/A 07/01/2018   Procedure: TRANSESOPHAGEAL ECHOCARDIOGRAM (TEE);  Surgeon: Buford Dresser, MD;  Location: [facility] ENDOSCOPY;  Service: Cardiovascular;  Laterality: N/A;   TEE WITHOUT CARDIOVERSION N/A 06/07/2019   Procedure: TRANSESOPHAGEAL ECHOCARDIOGRAM (TEE);  Surgeon: Buford Dresser, MD;  Location: [facility] ENDOSCOPY;  Service: Cardiovascular;  Laterality: N/A;   TISSUE AORTIC VALVE REPLACEMENT       OB History   No obstetric history on file.     Family History  Problem Relation Age of Onset   Parkinson's disease Father    Heart attack Mother     Social History   Tobacco Use   Smoking status: Never   Smokeless tobacco: Never  Vaping Use   Vaping Use: Never used  Substance Use Topics   Alcohol use: No   Drug use: No    Home Medications Prior to Admission medications   Medication Sig Start Date End Date Taking? Authorizing Provider  acetaminophen (TYLENOL) 325 MG tablet Take 2 tablets (650 mg total) by mouth every 6 (six) hours as needed for mild pain or headache. 09/12/20   Raiford Noble Latif, DO  amoxicillin (AMOXIL) 500 MG capsule Take 2,000 mg by mouth See admin instructions. 1 hour prior to dental appointment 08/22/20   [provider]  camphor-menthol [facility]) lotion Apply 1 application topically as needed for itching. 06/09/19   Kayleen Memos, DO  ferrous sulfate 324 MG TBEC Take 324  mg by mouth.    [provider]  hydrOXYzine (ATARAX/VISTARIL) 50 MG tablet Take 1 tablet (50 mg total) by mouth 3 (three) times daily as needed for itching. 09/12/20   Raiford Noble Latif, DO  levonorgestrel (MIRENA) 20 MCG/24HR IUD 1 each by Intrauterine route once.    [provider]  metoprolol succinate (TOPROL XL) 25 MG 24 hr tablet Take 2 tablets (50 mg total) by mouth in the morning AND 1 tablet (25 mg total) every evening. 11/03/20   Richardo Priest, MD  oxybutynin (DITROPAN-XL) 5 MG 24 hr tablet Take 5 mg by mouth daily. 12/11/20   [provider]  potassium chloride (KLOR-CON) 10 MEQ tablet Take 10 mEq by mouth 2 (two) times daily. 05/18/19   [provider]  pravastatin (PRAVACHOL) 40 MG tablet Take 1 tablet (40 mg total) by mouth daily at 6 PM. 06/09/19   Kayleen Memos, DO  torsemide (DEMADEX) 20 MG tablet Take 3 tablets (60 mg total) by mouth daily. 03/28/20  Larey Dresser, MD  warfarin (COUMADIN) 2.5 MG tablet Take 1 tablet by mouth once daily 02/13/21   Richardo Priest, MD    Allergies    Vancomycin  Review of Systems   Review of Systems  Constitutional:  Negative for appetite change, chills and fever.  HENT:  Negative for ear pain, rhinorrhea, sneezing and sore throat.   Eyes:  Negative for photophobia and visual disturbance.  Respiratory:  Negative for cough, chest tightness, shortness of breath and wheezing.   Cardiovascular:  Negative for chest pain and palpitations.  Gastrointestinal:  Negative for abdominal pain, blood in stool, constipation, diarrhea, nausea and vomiting.  Genitourinary:  Negative for dysuria, hematuria and urgency.  Musculoskeletal:  Negative for myalgias.  Skin:  Negative for rash.  Neurological:  Positive for weakness. Negative for dizziness and light-headedness.   Physical Exam Updated Vital Signs BP 94/67   Pulse 82   Temp 98.2 F (36.8 C) (Oral)   Resp 17   Ht 5\' 1"  (1.549 m)   Wt 106.1 kg   SpO2 91%    BMI 44.21 kg/m   Physical Exam Vitals and nursing note reviewed.  Constitutional:      General: She is not in acute distress.    Appearance: She is well-developed.  HENT:     Head: Normocephalic and atraumatic.     Nose: Nose normal.  Eyes:     General: No scleral icterus.       Right eye: No discharge.        Left eye: No discharge.     Conjunctiva/sclera: Conjunctivae normal.     Pupils: Pupils are equal, round, and reactive to light.  Cardiovascular:     Rate and Rhythm: Normal rate and regular rhythm.     Heart sounds: Normal heart sounds. No murmur heard.   No friction rub. No gallop.  Pulmonary:     Effort: Pulmonary effort is normal. No respiratory distress.     Breath sounds: Normal breath sounds.  Abdominal:     General: Bowel sounds are normal. There is no distension.     Palpations: Abdomen is soft.     Tenderness: There is no abdominal tenderness. There is no guarding.  Musculoskeletal:        General: Normal range of motion.     Cervical back: Normal range of motion and neck supple.     Comments: Left lower extremity wrapped due to lymphedema.  2+ DP pulse palpated bilaterally.  Skin:    General: Skin is warm and dry.     Findings: No rash.  Neurological:     Mental Status: She is alert and oriented to person, place, and time.     Cranial Nerves: No cranial nerve deficit.     Sensory: No sensory deficit.     Motor: No weakness or abnormal muscle tone.     Coordination: Coordination normal.     Comments: Pupils reactive. No facial asymmetry noted. Cranial nerves appear grossly intact. Sensation intact to light touch on face, BUE and BLE. Strength 5/5 in BUE and BLE.     ED Results / Procedures / Treatments   Labs (all labs ordered are listed, but only abnormal results are displayed) Labs Reviewed  COMPREHENSIVE METABOLIC PANEL - Abnormal; Notable for the following components:      Result Value   Sodium 134 (*)    Potassium 3.2 (*)    Chloride 96 (*)     Glucose, Bld 146 (*)  BUN 35 (*)    Creatinine, Ser 2.01 (*)    GFR, Estimated 27 (*)    All other components within normal limits  CBC WITH DIFFERENTIAL/PLATELET - Abnormal; Notable for the following components:   WBC 20.3 (*)    RBC 6.14 (*)    Hemoglobin 17.1 (*)    HCT 53.4 (*)    Neutro Abs 16.9 (*)    Monocytes Absolute 1.4 (*)    Abs Immature Granulocytes 0.12 (*)    All other components within normal limits  PROTIME-INR - Abnormal; Notable for the following components:   Prothrombin Time 55.9 (*)    INR 6.4 (*)    All other components within normal limits  APTT - Abnormal; Notable for the following components:   aPTT 75 (*)    All other components within normal limits  URINALYSIS, ROUTINE W REFLEX MICROSCOPIC - Abnormal; Notable for the following components:   Color, Urine AMBER (*)    APPearance CLOUDY (*)    Protein, ur 100 (*)    Leukocytes,Ua MODERATE (*)    Bacteria, UA MANY (*)    All other components within normal limits  CBG MONITORING, ED - Abnormal; Notable for the following components:   Glucose-Capillary 173 (*)    All other components within normal limits  TROPONIN I (HIGH SENSITIVITY) - Abnormal; Notable for the following components:   Troponin I (High Sensitivity) 53 (*)    All other components within normal limits  URINE CULTURE  LIPASE, BLOOD  TROPONIN I (HIGH SENSITIVITY)    EKG EKG Interpretation  Date/Time:  Thursday March 22 2021 18:18:08 EDT Ventricular Rate:  79 PR Interval:  195 QRS Duration: 104 QT Interval:  373 QTC Calculation: 428 R Axis:   -2 Text Interpretation: Sinus rhythm Repol abnrm suggests ischemia, lateral leads Confirmed by Madalyn Rob [id number]) on 03/22/2021 9:11:39 PM  Radiology CT Head Wo Contrast  Result Date: 03/22/2021 CLINICAL DATA:  Near syncope. EXAM: CT HEAD WITHOUT CONTRAST TECHNIQUE: Contiguous axial images were obtained from the base of the skull through the vertex without intravenous contrast.  COMPARISON:  September 2040 12/28/2018 and MRI June 04, 2019 FINDINGS: Brain: No evidence of acute large vascular territory infarction, hemorrhage, hydrocephalus, extra-axial collection or mass lesion/mass effect. Vascular: No hyperdense vessel or unexpected calcification. Skull: Normal. Negative for fracture or focal lesion. Sinuses/Orbits: The visualized portions of the paranasal sinuses and mastoid air cells are predominantly clear. Orbits are grossly unremarkable. Other: Streak artifact from dental hardware. IMPRESSION: No acute intracranial findings. Electronically Signed   By: Dahlia Bailiff MD   On: 03/22/2021 20:05    Procedures .Critical Care  Date/Time: 03/22/2021 9:39 PM Performed by: Delia Heady, PA-C Authorized by: Delia Heady, PA-C   Critical care provider statement:    Critical care time (minutes):  45   Critical care time was exclusive of:  Teaching time and separately billable procedures and treating other patients   Critical care was necessary to treat or prevent imminent or life-threatening deterioration of the following conditions:  Cardiac failure, renal failure, circulatory failure and shock   Critical care was time spent personally by me on the following activities:  Development of treatment plan with patient or surrogate, discussions with consultants, evaluation of patient's response to treatment, examination of patient, ordering and performing treatments and interventions, ordering and review of laboratory studies, pulse oximetry, re-evaluation of patient's condition, review of old charts and obtaining history from patient or surrogate   I assumed direction of critical care  for this patient from another provider in my specialty: no     Care discussed with: admitting provider     Medications Ordered in ED Medications  sodium chloride 0.9 % bolus 500 mL (has no administration in time range)  sodium chloride 0.9 % bolus 1,000 mL (1,000 mLs Intravenous New Bag/Given  03/22/21 2125)    ED Course  I have reviewed the triage vital signs and the nursing notes.  Pertinent labs & imaging results that were available during my care of the patient were reviewed by me and considered in my medical decision making (see chart for details).  Clinical Course as of 03/22/21 2136  Thu Mar 22, 2021  1954 Hemoglobin(!): 17.1 [HK]  2011 INR(!!): 6.4 [HK]  2053 Troponin I (High Sensitivity)(!): 53 [HK]  2053 WBC(!): 20.3 [HK]  2053 Creatinine(!): 2.01 Baseline <1. [HK]  2053 Chalmers Guest): MODERATE [HK]  2053 Bacteria, UA(!): MANY [HK]  2108 Spoke to ED pharmacist Roderic Palau.  Does not recommend reversal at this time as she is not actively bleeding.  Recommends holding the Coumadin for now and closely monitoring for any signs of bleeding. [HK]  2111 Patient states that last Tuesday her INR was 7.8.  She was told to hold her Coumadin for 4 days.  On Saturday she took half a pill as she typically does (1.25 mg) as well as on Sunday.  On Monday she took her full dose 2.5 mg.  When she had her INR rechecked 2 days ago it again was 7.8.  She has not had a dose of Coumadin since 03/19/2021. [HK]    Clinical Course User Index [HK] , , PA-C   MDM Rules/Calculators/A&P                          65  year old female with past medical history of CAD, aortic valve replacement currently on Coumadin, hypertension, anemia presenting to the ED with a chief complaint of near syncope.  For the past 2 days she has had 3 episodes of "my legs giving out on me."  She denies head injury or loss of consciousness when she has these issues.  She has been able to ambulate shortly after the incident.  Denies any headache, vision changes, chest pain.  She had 1 episode of nonbloody, nonbilious emesis today but otherwise denies any vomiting or changes to her appetite.  Her INR has been greater than 7 for the past 1.5 weeks and has been following instructions to hold that.  She has not taken  Coumadin since 03/19/2021.  She denies any bloody stools.  She has had intermittent nosebleeds for the past 2 days but denies any bleeding currently. On exam patient without any neurological deficits.  She has lower extremity edema which is chronic for her.  She denies any chest pain. EKG with nonspecific ST changes, initial troponin is 53 but will check delta troponin.  CT of the head without any acute findings.  Lab work here significant for INR of 6.4, leukocytosis of 20 and AKI with a creatinine of 2.01.  Her baseline appears less than 1.  Urinalysis also shows moderate leukocytes, many bacteria and this was sent for culture.  Will give IV fluids.  Will hold off on reversal for Coumadin at this time as she is not actively bleeding but will monitor for signs of bleeding.  Will admit to medicine service for management of her AKI, supratherapeutic INR and elevation in troponin.    Portions of  this note were generated with [employer]. Dictation errors may occur despite best attempts at proofreading.  Final Clinical Impression(s) / ED Diagnoses Final diagnoses:  AKI (acute kidney injury) ([place])  Supratherapeutic INR  Lower urinary tract infectious disease  Dehydration    Rx / DC Orders ED Discharge Orders     None        Delia Heady, PA-C 03/22/21 2139    Lucrezia Starch, MD 03/23/21 1149    Lucrezia Starch, MD 03/23/21 1152    Lucrezia Starch, MD 03/23/21 1153

## 2021-03-22 NOTE — Telephone Encounter (Signed)
Pt is calling requesting a call back.Her INR is running high.

## 2021-03-22 NOTE — ED Triage Notes (Signed)
BIB EMS for weakness x 2 days and today became diaphoretic and "legs came out from under her"   Landed on her bottom. No LOC.  No CP  One episode of vomiting and diarrhea today.

## 2021-03-22 NOTE — Telephone Encounter (Signed)
[name] is calling back in regards to this due to being concerned that she has not heard back in regards to it. Please advise.

## 2021-03-22 NOTE — Telephone Encounter (Signed)
Pt report that she has been having off and on nose bleeds and 3 falls in the past few days.

## 2021-03-22 NOTE — ED Notes (Signed)
Patient transported to CT 

## 2021-03-22 NOTE — H&P (Signed)
History and Physical    [name] QBV:[id number] DOB: September 13, 1955 DOA: 03/22/2021  PCP: [name], MD  Patient coming from: Home  I have personally briefly reviewed patient's old medical records in [place]  Chief Complaint: pre-syncope  HPI: [name] is a 64 y.o. female with medical history significant for aortic stenosis s/p TAVR on warfarin, chronic diastolic heart failure, hypertension, TIA, OSA on BiPAP who presents with concerns of presyncopal episodes.  She reports that for the past 2 days she has had 3 different falls.  The falls usually happen when she is walking across the room.  States that her head feels "funny" and she would immediately feel her legs give out from underneath her.  She tried using her Rollator today to help support her but still had a third fall with similar symptoms prior.  Denies any room spinning sensation.  She denies any loss of consciousness.  Did not hit her head.  Denies any chest pain or shortness of breath.  No dysuria, increased frequency or urgency.  She does take torsemide and states her last week she had urinated so much that she lost 6 pounds.  She was also noted last week to have a supratherapeutic INR of 7.8 on 7/5.  She was asked to hold her Coumadin for several days and had a repeat on 7/12 that was still elevated at 7.8.  The past 2 days she has also noticed nosebleeds but denies any bright red blood per rectum or dark stools.  She denies any changes in her diet.  No recent antibiotics.  3 weeks ago she was started on bupropion for depression.  ED Course: She was afebrile but noted to be hypotensive down to 90 over 60s.  WBC of 20.3.  Hemoglobin of 17.1.  Creatinine elevated to 2.01 from baseline of 0.85 with BUN of 35. INR of 6.4. Negative CT head.  Review of Systems: Constitutional: + Weight Change, No Fever ENT/Mouth: No sore throat, No Rhinorrhea Eyes: No Eye Pain, No Vision Changes Cardiovascular: No Chest  Pain, no SOB, No PND, No Dyspnea on Exertion, No Orthopnea, No Edema, No Palpitations Respiratory: No Cough, No Sputum, No Wheezing, no Dyspnea  Gastrointestinal: No Nausea, No Vomiting, No Diarrhea, No Constipation, No Pain Genitourinary: no Urinary Incontinence, No Urgency, No Flank Pain Musculoskeletal: No Arthralgias, No Myalgias Skin: No Skin Lesions, No Pruritus, Neuro: no Weakness, No Numbness,  No Loss of Consciousness, No Syncope Psych: No Anxiety/Panic, No Depression, no decrease appetite Heme/Lymph: No Bruising, + Bleeding  Past Medical History:  Diagnosis Date   Anemia 06/03/2019   Aortic stenosis 03/23/2013   Overview:  02/18/13 TTE EF >55%. Critical AS with mean Ao valve gradient of 82 mm Hg. No AI. No MR, PR, mild TR. Estimated RVSP 30 mm Hg.   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 Postmenopausal bleeding since for the past 2 years.  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 Last  biopsy was in May of 2019 which showed weakly proliferative endometrium possibly a polyp.     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    Past Surgical History:  Procedure Laterality Date   BUBBLE STUDY  06/07/2019   Procedure: BUBBLE STUDY;  Surgeon: Buford Dresser, MD;  Location: [facility];  Service: Cardiovascular;;   CHOLECYSTECTOMY  1983   RIGHT/LEFT HEART CATH AND CORONARY ANGIOGRAPHY N/A 04/01/2019   Procedure: RIGHT/LEFT HEART CATH AND CORONARY ANGIOGRAPHY;  Surgeon: Lorretta Harp, MD;  Location: [facility];  Service: Cardiovascular;  Laterality: N/A;   TEE WITHOUT CARDIOVERSION N/A 07/01/2018   Procedure: TRANSESOPHAGEAL ECHOCARDIOGRAM (TEE);  Surgeon: Buford Dresser, MD;  Location: [facility] ENDOSCOPY;  Service: Cardiovascular;  Laterality: N/A;   TEE WITHOUT CARDIOVERSION N/A 06/07/2019   Procedure: TRANSESOPHAGEAL ECHOCARDIOGRAM (TEE);  Surgeon: Buford Dresser, MD;  Location: [facility];  Service: Cardiovascular;  Laterality: N/A;   TISSUE AORTIC VALVE REPLACEMENT       reports that she has never smoked. She has never used smokeless tobacco. She reports that she does not drink alcohol and does not use drugs. Social History  Allergies  Allergen Reactions   Vancomycin Itching    Patient can receive vancomycin with slower infusion and prn benadryl for itching     Family History  Problem Relation Age of Onset   Parkinson's disease Father    Heart attack Mother      Prior to Admission medications   Medication Sig Start Date End Date Taking? Authorizing Provider  acetaminophen (TYLENOL) 325 MG tablet Take 2 tablets (650 mg total) by mouth every 6 (six) hours as needed for mild pain or headache. 09/12/20  Yes Sheikh, Omair Latif, DO  buPROPion (WELLBUTRIN XL) 150 MG 24 hr tablet Take 150 mg by mouth daily. 03/01/21  Yes [provider]  camphor-menthol Timoteo Ace) lotion Apply 1  application topically as needed for itching. 06/09/19  Yes Kayleen Memos, DO  DULoxetine (CYMBALTA) 60 MG capsule Take 60 mg by mouth daily. 03/01/21  Yes [provider]  Ferrous Sulfate Dried (HIGH POTENCY IRON) 65 MG TABS Take 1 tablet by mouth daily.   Yes [provider]  metoprolol succinate (TOPROL-XL) 25 MG 24 hr tablet Take 25-50 mg by mouth See admin instructions. Take two of the 25 mg (total of 50 mg) tablets by mouth in the morning and then take one 25 mg tablet by mouth in the evening per patient   Yes [provider]  naproxen (NAPROSYN) 250 MG tablet Take 250 mg by mouth 2 (two) times daily as needed for mild pain.   Yes [provider]  oxybutynin (DITROPAN-XL) 5 MG 24 hr tablet Take 5 mg by mouth every evening. 12/11/20  Yes [provider]  potassium chloride (KLOR-CON) 10 MEQ tablet Take 10 mEq by mouth 2 (two) times daily. 05/18/19  Yes [provider]  pravastatin (PRAVACHOL) 40 MG tablet Take 1 tablet (40 mg total) by mouth daily at  6 PM. 06/09/19  Yes Irene Pap N, DO  torsemide (DEMADEX) 20 MG tablet Take 3 tablets (60 mg total) by mouth daily. 03/28/20  Yes Larey Dresser, MD  warfarin (COUMADIN) 2.5 MG tablet Take 1 tablet by mouth once daily Patient taking differently: Take 2.5 mg MWF, and then take half all other days 02/13/21  Yes Richardo Priest, MD  levonorgestrel (MIRENA) 20 MCG/24HR IUD 1 each by Intrauterine route once.    [provider]    Physical Exam: Vitals:   03/22/21 1817 03/22/21 1819 03/22/21 1915 03/22/21 2045  BP: 123/67  109/78 94/67  Pulse:   86 82  Resp: 18  15 17   Temp: 98.2 F (36.8 C)     TempSrc: Oral     SpO2: 98%  95% 91%  Weight:  106.1 kg    Height:  5\' 1"  (1.549 m)      Constitutional: NAD, calm, comfortable, elderly female laying at 20 degree incline in bed Vitals:   03/22/21 1817 03/22/21 1819 03/22/21 1915 03/22/21 2045  BP: 123/67  109/78 94/67  Pulse:   86 82   Resp: 18  15 17   Temp: 98.2 F (36.8 C)     TempSrc: Oral     SpO2: 98%  95% 91%  Weight:  106.1 kg    Height:  5\' 1"  (1.549 m)     Eyes: PERRL, lids and conjunctivae normal ENMT: Mucous membranes are moist.  Neck: normal, supple Respiratory: clear to auscultation bilaterally, no wheezing, no crackles. Normal respiratory effort. No accessory muscle use.  Cardiovascular: Regular rate and rhythm, no murmurs / rubs / gallops. Nonpitting edema of bilateral LE Abdomen: no tenderness, no masses palpated. Bowel sounds positive.  Musculoskeletal: no clubbing / cyanosis. No joint deformity upper and lower extremities. Good ROM, no contractures. Normal muscle tone.  Skin: no rashes, lesions, ulcers. No induration Neurologic: CN 2-12 grossly intact. Sensation intact. Strength 5/5 in all 4.  Psychiatric: Normal judgment and insight. Alert and oriented x 3. Normal mood.    Labs on Admission: I have personally reviewed following labs and imaging studies  CBC: Recent Labs  Lab 03/22/21 1932  WBC 20.3*  NEUTROABS 16.9*  HGB 17.1*  HCT 53.4*  MCV 87.0  PLT 562   Basic Metabolic Panel: Recent Labs  Lab 03/22/21 1932  NA 134*  K 3.2*  CL 96*  CO2 24  GLUCOSE 146*  BUN 35*  CREATININE 2.01*  CALCIUM 9.7   GFR: Estimated Creatinine Clearance: 30.9 mL/min (A) (by C-G formula based on SCr of 2.01 mg/dL (H)). Liver Function Tests: Recent Labs  Lab 03/22/21 1932  AST 25  ALT 17  ALKPHOS 64  BILITOT 1.0  PROT 7.1  ALBUMIN 3.5   Recent Labs  Lab 03/22/21 1932  LIPASE 24   No results for input(s): AMMONIA in the last 168 hours. Coagulation Profile: Recent Labs  Lab 03/20/21 1543 03/22/21 1932  INR 7.8* 6.4*   Cardiac Enzymes: No results for input(s): CKTOTAL, CKMB, CKMBINDEX, TROPONINI in the last 168 hours. BNP (last 3 results) Recent Labs    06/06/20 0903  PROBNP 1,079*   HbA1C: No results for input(s): HGBA1C in the last 72 hours. CBG: Recent Labs  Lab  03/22/21 1931  GLUCAP 173*   Lipid Profile: No results for input(s): CHOL, HDL, LDLCALC, TRIG, CHOLHDL, LDLDIRECT in the last 72 hours. Thyroid Function Tests: No results for input(s): TSH, T4TOTAL, FREET4, T3FREE, THYROIDAB in the last 72 hours. Anemia  Panel: No results for input(s): VITAMINB12, FOLATE, FERRITIN, TIBC, IRON, RETICCTPCT in the last 72 hours. Urine analysis:    Component Value Date/Time   COLORURINE AMBER (A) 03/22/2021 1856   APPEARANCEUR CLOUDY (A) 03/22/2021 1856   LABSPEC 1.016 03/22/2021 1856   PHURINE 5.0 03/22/2021 1856   GLUCOSEU NEGATIVE 03/22/2021 1856   HGBUR NEGATIVE 03/22/2021 1856   BILIRUBINUR NEGATIVE 03/22/2021 1856   KETONESUR NEGATIVE 03/22/2021 1856   PROTEINUR 100 (A) 03/22/2021 1856   NITRITE NEGATIVE 03/22/2021 1856   LEUKOCYTESUR MODERATE (A) 03/22/2021 1856    Radiological Exams on Admission: CT Head Wo Contrast  Result Date: 03/22/2021 CLINICAL DATA:  Near syncope. EXAM: CT HEAD WITHOUT CONTRAST TECHNIQUE: Contiguous axial images were obtained from the base of the skull through the vertex without intravenous contrast. COMPARISON:  September 2040 12/28/2018 and MRI June 04, 2019 FINDINGS: Brain: No evidence of acute large vascular territory infarction, hemorrhage, hydrocephalus, extra-axial collection or mass lesion/mass effect. Vascular: No hyperdense vessel or unexpected calcification. Skull: Normal. Negative for fracture or focal lesion. Sinuses/Orbits: The visualized portions of the paranasal sinuses and mastoid air cells are predominantly clear. Orbits are grossly unremarkable. Other: Streak artifact from dental hardware. IMPRESSION: No acute intracranial findings. Electronically Signed   By: Dahlia Bailiff MD   On: 03/22/2021 20:05      Assessment/Plan  Presyncope/weakness secondary to hypotension -Suspect that patient is over diuresed with her torsemide causing her to be volume depleted and hypotensive -Has received 1.5 L  normal saline fluid in the ED.  We will have continuous IV LR for 12 hours. -Hold antihypertensives  AKI -Suspect due to overdiuresis with torsemide -Follow-up with repeat labs after IV fluid resuscitation  Supratherapeutic INR w/hx of aortic valve repair -INR 6.4 -Suspect could be due to her AKI.  Only new medication was bupropion 2 weeks ago which is unlikely to cause. -Has had 2 days of nosebleed but no other significant findings of bleeding.  Hemoglobin is stable.  We will hold Coumadin without reversal at this time. -Check INR daily  Diastolic heart failure - Monitor closely while receiving fluids  OSA - Continue BiPAP  Depression - Continue bupropion and Cymbalta   DVT prophylaxis:SCDs Code Status: Full Family Communication: Plan discussed with patient at bedside  disposition Plan: Home with observation Consults called:  Admission status: Observation   Level of care: Progressive  Status is: Observation  The patient remains OBS appropriate and will d/c before 2 midnights.  Dispo: The patient is from: Home              Anticipated d/c is to: Home              Patient currently is not medically stable to d/c.   Difficult to place patient No         Orene Desanctis DO [facility]   If 7PM-7AM, please contact night-coverage [email or web address]   03/22/2021, 10:42 PM

## 2021-03-23 DIAGNOSIS — E785 Hyperlipidemia, unspecified: Secondary | ICD-10-CM | POA: Diagnosis present

## 2021-03-23 DIAGNOSIS — I890 Lymphedema, not elsewhere classified: Secondary | ICD-10-CM | POA: Diagnosis present

## 2021-03-23 DIAGNOSIS — D631 Anemia in chronic kidney disease: Secondary | ICD-10-CM | POA: Diagnosis present

## 2021-03-23 DIAGNOSIS — I951 Orthostatic hypotension: Secondary | ICD-10-CM | POA: Diagnosis present

## 2021-03-23 DIAGNOSIS — E860 Dehydration: Secondary | ICD-10-CM | POA: Diagnosis present

## 2021-03-23 DIAGNOSIS — G4733 Obstructive sleep apnea (adult) (pediatric): Secondary | ICD-10-CM | POA: Diagnosis present

## 2021-03-23 DIAGNOSIS — N179 Acute kidney failure, unspecified: Secondary | ICD-10-CM | POA: Diagnosis present

## 2021-03-23 DIAGNOSIS — S8002XA Contusion of left knee, initial encounter: Secondary | ICD-10-CM | POA: Diagnosis present

## 2021-03-23 DIAGNOSIS — Z6841 Body Mass Index (BMI) 40.0 and over, adult: Secondary | ICD-10-CM | POA: Diagnosis not present

## 2021-03-23 DIAGNOSIS — I2510 Atherosclerotic heart disease of native coronary artery without angina pectoris: Secondary | ICD-10-CM | POA: Diagnosis present

## 2021-03-23 DIAGNOSIS — Z881 Allergy status to other antibiotic agents status: Secondary | ICD-10-CM | POA: Diagnosis not present

## 2021-03-23 DIAGNOSIS — I9589 Other hypotension: Secondary | ICD-10-CM | POA: Diagnosis not present

## 2021-03-23 DIAGNOSIS — W19XXXA Unspecified fall, initial encounter: Secondary | ICD-10-CM | POA: Diagnosis present

## 2021-03-23 DIAGNOSIS — I130 Hypertensive heart and chronic kidney disease with heart failure and stage 1 through stage 4 chronic kidney disease, or unspecified chronic kidney disease: Secondary | ICD-10-CM | POA: Diagnosis present

## 2021-03-23 DIAGNOSIS — E876 Hypokalemia: Secondary | ICD-10-CM | POA: Diagnosis present

## 2021-03-23 DIAGNOSIS — R443 Hallucinations, unspecified: Secondary | ICD-10-CM | POA: Diagnosis not present

## 2021-03-23 DIAGNOSIS — Z8249 Family history of ischemic heart disease and other diseases of the circulatory system: Secondary | ICD-10-CM | POA: Diagnosis not present

## 2021-03-23 DIAGNOSIS — R627 Adult failure to thrive: Secondary | ICD-10-CM | POA: Diagnosis present

## 2021-03-23 DIAGNOSIS — Z8673 Personal history of transient ischemic attack (TIA), and cerebral infarction without residual deficits: Secondary | ICD-10-CM | POA: Diagnosis not present

## 2021-03-23 DIAGNOSIS — Z954 Presence of other heart-valve replacement: Secondary | ICD-10-CM | POA: Diagnosis not present

## 2021-03-23 DIAGNOSIS — R791 Abnormal coagulation profile: Secondary | ICD-10-CM

## 2021-03-23 DIAGNOSIS — S7012XA Contusion of left thigh, initial encounter: Secondary | ICD-10-CM | POA: Diagnosis present

## 2021-03-23 DIAGNOSIS — Z953 Presence of xenogenic heart valve: Secondary | ICD-10-CM | POA: Diagnosis not present

## 2021-03-23 DIAGNOSIS — F32A Depression, unspecified: Secondary | ICD-10-CM | POA: Diagnosis present

## 2021-03-23 DIAGNOSIS — R55 Syncope and collapse: Secondary | ICD-10-CM

## 2021-03-23 DIAGNOSIS — Z20822 Contact with and (suspected) exposure to covid-19: Secondary | ICD-10-CM | POA: Diagnosis present

## 2021-03-23 DIAGNOSIS — I421 Obstructive hypertrophic cardiomyopathy: Secondary | ICD-10-CM | POA: Diagnosis present

## 2021-03-23 DIAGNOSIS — I5032 Chronic diastolic (congestive) heart failure: Secondary | ICD-10-CM | POA: Diagnosis present

## 2021-03-23 LAB — CBC
HCT: 42.5 % (ref 36.0–46.0)
Hemoglobin: 14 g/dL (ref 12.0–15.0)
MCH: 27.9 pg (ref 26.0–34.0)
MCHC: 32.9 g/dL (ref 30.0–36.0)
MCV: 84.8 fL (ref 80.0–100.0)
Platelets: 225 10*3/uL (ref 150–400)
RBC: 5.01 MIL/uL (ref 3.87–5.11)
RDW: 14.5 % (ref 11.5–15.5)
WBC: 22.7 10*3/uL — ABNORMAL HIGH (ref 4.0–10.5)
nRBC: 0 % (ref 0.0–0.2)

## 2021-03-23 LAB — BASIC METABOLIC PANEL
Anion gap: 10 (ref 5–15)
BUN: 34 mg/dL — ABNORMAL HIGH (ref 8–23)
CO2: 25 mmol/L (ref 22–32)
Calcium: 9.2 mg/dL (ref 8.9–10.3)
Chloride: 100 mmol/L (ref 98–111)
Creatinine, Ser: 1.73 mg/dL — ABNORMAL HIGH (ref 0.44–1.00)
GFR, Estimated: 32 mL/min — ABNORMAL LOW (ref >60)
Glucose, Bld: 112 mg/dL — ABNORMAL HIGH (ref 70–99)
Potassium: 2.9 mmol/L — ABNORMAL LOW (ref 3.5–5.1)
Sodium: 135 mmol/L (ref 135–145)

## 2021-03-23 LAB — GLUCOSE, CAPILLARY: Glucose-Capillary: 120 mg/dL — ABNORMAL HIGH (ref 70–99)

## 2021-03-23 LAB — TSH: TSH: 1.908 u[IU]/mL (ref 0.350–4.500)

## 2021-03-23 LAB — MRSA NEXT GEN BY PCR, NASAL: MRSA by PCR Next Gen: DETECTED — AB

## 2021-03-23 LAB — PROTIME-INR
INR: 6.5 (ref 0.8–1.2)
Prothrombin Time: 56.7 seconds — ABNORMAL HIGH (ref 11.4–15.2)

## 2021-03-23 LAB — SARS CORONAVIRUS 2 (TAT 6-24 HRS): SARS Coronavirus 2: NEGATIVE

## 2021-03-23 MED ORDER — POTASSIUM CHLORIDE 10 MEQ/100ML IV SOLN: 10.0000 meq | INTRAVENOUS | Status: DC

## 2021-03-23 MED ORDER — LIDOCAINE HCL URETHRAL/MUCOSAL 2 % EX GEL
1.0000 "application " | Freq: Once | CUTANEOUS | Status: AC
  Administered 2021-03-23: 1 via TOPICAL
  Filled 2021-03-23: qty 11

## 2021-03-23 MED ORDER — OXYBUTYNIN CHLORIDE ER 5 MG PO TB24
5.0000 mg | ORAL_TABLET | Freq: Every evening | ORAL | Status: DC
  Administered 2021-03-23 – 2021-04-01 (×10): 5 mg via ORAL
  Filled 2021-03-23 (×11): qty 1

## 2021-03-23 MED ORDER — LACTATED RINGERS IV SOLN: INTRAVENOUS | Status: AC

## 2021-03-23 MED ORDER — MUPIROCIN 2 % EX OINT
1.0000 "application " | TOPICAL_OINTMENT | Freq: Two times a day (BID) | CUTANEOUS | Status: AC
  Administered 2021-03-23 – 2021-03-27 (×10): 1 via NASAL
  Filled 2021-03-23: qty 22

## 2021-03-23 MED ORDER — CHLORHEXIDINE GLUCONATE CLOTH 2 % EX PADS: 6.0000 | MEDICATED_PAD | Freq: Every day | CUTANEOUS | Status: DC

## 2021-03-23 MED ORDER — POTASSIUM CHLORIDE CRYS ER 10 MEQ PO TBCR
10.0000 meq | EXTENDED_RELEASE_TABLET | Freq: Two times a day (BID) | ORAL | Status: DC
  Administered 2021-03-23 (×2): 10 meq via ORAL
  Filled 2021-03-23 (×2): qty 1

## 2021-03-23 MED ORDER — ACETAMINOPHEN 325 MG PO TABS
650.0000 mg | ORAL_TABLET | Freq: Four times a day (QID) | ORAL | Status: DC | PRN
  Administered 2021-03-23 – 2021-04-02 (×9): 650 mg via ORAL
  Filled 2021-03-23 (×9): qty 2

## 2021-03-23 MED ORDER — PRAVASTATIN SODIUM 40 MG PO TABS
40.0000 mg | ORAL_TABLET | Freq: Every day | ORAL | Status: DC
  Administered 2021-03-23 – 2021-04-01 (×10): 40 mg via ORAL
  Filled 2021-03-23 (×10): qty 1

## 2021-03-23 MED ORDER — DULOXETINE HCL 60 MG PO CPEP
60.0000 mg | ORAL_CAPSULE | Freq: Every day | ORAL | Status: DC
  Administered 2021-03-23 – 2021-04-02 (×11): 60 mg via ORAL
  Filled 2021-03-23 (×11): qty 1

## 2021-03-23 MED ORDER — POTASSIUM CHLORIDE CRYS ER 20 MEQ PO TBCR
40.0000 meq | EXTENDED_RELEASE_TABLET | Freq: Two times a day (BID) | ORAL | Status: AC
  Administered 2021-03-23 – 2021-03-24 (×2): 40 meq via ORAL
  Filled 2021-03-23 (×2): qty 2

## 2021-03-23 MED ORDER — BUPROPION HCL ER (XL) 150 MG PO TB24
150.0000 mg | ORAL_TABLET | Freq: Every day | ORAL | Status: DC
  Administered 2021-03-23 – 2021-03-31 (×9): 150 mg via ORAL
  Filled 2021-03-23 (×11): qty 1

## 2021-03-23 NOTE — Plan of Care (Signed)
  Problem: Education: Goal: Knowledge of General Education information will improve Description: Including pain rating scale, medication(s)/side effects and non-pharmacologic comfort measures Outcome: Progressing   Problem: Health Behavior/Discharge Planning: Goal: Ability to manage health-related needs will improve Outcome: Progressing   Problem: Clinical Measurements: Goal: Diagnostic test results will improve Outcome: Progressing   Problem: Activity: Goal: Risk for activity intolerance will decrease Outcome: Progressing   Problem: Elimination: Goal: Will not experience complications related to urinary retention Outcome: Progressing   Problem: Pain Managment: Goal: General experience of comfort will improve Outcome: Progressing

## 2021-03-23 NOTE — Plan of Care (Signed)

## 2021-03-23 NOTE — Progress Notes (Signed)
PROGRESS NOTE    [name]  EUM:[id number] DOB: Oct 21, 1955 DOA: 03/22/2021 PCP: [name], MD   Brief Narrative:  The patient is a morbidly obese 65 year old Caucasian female with a past medical history significant for but not limited to aortic stenosis status post TAVR currently on anticoagulation with warfarin, chronic diastolic CHF, hypertension, history of TIA, history of obstructive sleep apnea on BiPAP as well as other comorbidities who presented to the hospital with presyncopal episodes.  She reports that for the last 2 to 3 days she has had falls 3 different times.  She states the falls happen when she is walking across the room and states that her "head feels funny" and states that her immediately feels like her legs give out from underneath her.  She tried using her Rollator yesterday to support her but had a third fall with similar symptoms.  She denies any room spinning sensation.  No loss of consciousness.  She did not hit her head.  She does take torsemide and states last week she had urinary symptoms that she lost about 6 pounds.  She was also noted to have a supratherapeutic INR of 7.8 at her PCP office on 03/13/2021 and she was asked to hold her Coumadin for several days and had repeat on 7/12 which was still elevated at 7.8.  For last few days she has noticed some nosebleeds but denies any blood per rectum or any dark stools.  Approximately 3 weeks ago she was started on bupropion for depression.  In the ED she was noted to be hypotensive down to the 90s over 60s and had an elevated WBC of 20.3.  Creatinine was elevated from her baseline of 0.5 and she was admitted for an AKI.  Given her fall she had a head CT and was negative.  She was admitted for presyncope and weakness in the setting of hypotension and dehydration likely suspected to overdiuresis with torsemide as well as for AKI.  Assessment & Plan:   Principal Problem:   Hypotension Active Problems:   H/O aortic  valve replacement with tissue graft   Sleep apnea   Chronic diastolic CHF (congestive heart failure) (HCC)   Weakness   Depression   AKI (acute kidney injury) ([facility])   Supratherapeutic INR  Presyncope/weakness secondary to hypotension -Suspect that patient is over diuresed with her torsemide causing her to be volume depleted and hypotensive -Has received 1.5 L normal saline fluid in the ED.  We will have continuous IV LR for 12 hours however I have renewed this for an additional 24 hours -Hold antihypertensives -Check orthostatic vital signs; she still remains on the softer side with her blood pressure so we will continue to hold her antihypertensives -Troponin was mildly elevated but flat at 53 and then repeat was 52 -Obtain PT and OT to further evaluate and treat   AKI -Suspect due to overdiuresis with torsemide -Baseline creatinine is around 0.8; she presented with a BUNs/creatinine 35/2.01 and now repeat is 34/1.73 -Avoid nephrotoxic medications, contrast dyes, hypotension and renally adjust medications -Follow-up with repeat labs after IV fluid resuscitation -Continue monitor and trend renal function carefully repeat CMP in a.m.  Hypokalemia -Patient's potassium was 2.8 -Replete with p.o. potassium chloride 40 mg twice daily -Check magnesium level in the a.m. X-continue monitor and replete as necessary -Repeat CMP in a.m.   Supratherapeutic INR w/hx of aortic stenosis status post AVR  -INR 6.4 and repeat was 6.5 -Suspect could be due to her  AKI.  Only new medication was bupropion 2 weeks ago which is unlikely to cause. -Has had 2 days of nosebleed but no other significant findings of bleeding.  Hemoglobin is stable.  We will hold Coumadin without reversal at this time but if she becomes symptomatic or actively bleeding we will give her vitamin K and possibly FFP -Check INR daily   Chronic Diastolic heart failure -Continue to Monitor closely while receiving fluids -Currently  appears dry and family thinks that her legs are looking the best they have usually been and this is usually what they are normally -Strict I's and O's and daily weights  -Continue to monitor for signs and symptoms of volume overload  Erythrocytosis -Likely was hemoconcentrated on admission from dehydration  -Patient hemoglobin/hematocrit went from 17.1/53.4 and is now 14.0/42.5 -Continue monitor and trend and repeat CBC   OSA -Continue BiPAP   Depression -Continue bupropion and Cymbalta   Hyperlipidemia -Continue with pravastatin 40 mg p.o. daily   Leukocytosis -Unclear etiology but likely in the setting of hemoconcentration from dehydration from overdiuresis -Patient WBC has gone from 20.3 is now trended up to 22.7 -If persistently remains elevated we will do an infectious work-up and obtain a chest x-ray, urinalysis with urine culture as well as blood cultures -Continue to monitor and trend and repeat CBC  Morbid Obesity -Complicates overall prognosis and care -Estimated body mass index is 46.03 kg/m as calculated from the following:   Height as of this encounter: 5\' 1"  (1.549 m).   Weight as of this encounter: 110.5 kg. -Weight Loss and Dietary Counseling given   DVT prophylaxis: Holding Coumadin given Supratherapeutic INR Code Status: FULL CODE Family Communication: Discussed with Husband and Daughter at bedside  Disposition Plan: Pending further clinical improvement and evaluation by PT/OT   Status is: Observation  The patient will require care spanning > 2 midnights and should be moved to inpatient because: Unsafe d/c plan, IV treatments appropriate due to intensity of illness or inability to take PO, Inpatient level of care appropriate due to severity of illness, and    Dispo: The patient is from: Home              Anticipated d/c is to:  TBD              Patient currently is not medically stable to d/c.   Difficult to place patient No  Consultants:   None  Procedures: None  Antimicrobials:  Anti-infectives (From admission, onward)    None        Subjective: Seen and examined at bedside and states that she started getting dizzy when she stood up last few times felt funny in her head.  No lightheadedness or dizziness currently.  States her legs are looking the best they have in a while.  Note nausea or vomiting.  Still feels a little weak.  No other concerns or complaints at this time.  Objective: Vitals:   03/23/21 0300 03/23/21 0723 03/23/21 1100 03/23/21 1619  BP: 98/60 110/67 105/63 (!) 93/51  Pulse: 74 83  90  Resp: 19 15 14 19   Temp: 98.3 F (36.8 C) 98.5 F (36.9 C) 98.7 F (37.1 C) 98.7 F (37.1 C)  TempSrc: Oral Oral Oral Oral  SpO2: 93% 94% 94% 96%  Weight:      Height:        Intake/Output Summary (Last 24 hours) at 03/23/2021 1754 Last data filed at 03/23/2021 1749 Gross per 24 hour  Intake 2004.64 ml  Output 200 ml  Net 1804.64 ml   Filed Weights   03/22/21 1819 03/23/21 0129  Weight: 106.1 kg 110.5 kg   Examination: Physical Exam:  Constitutional: WN/WD morbidly obese Caucasian female currently in no acute distress  Eyes: Lids and conjunctivae normal, sclerae anicteric  ENMT: External Ears, Nose appear normal. Grossly normal hearing.  Neck: Appears normal, supple, no cervical masses, normal ROM, no appreciable thyromegaly; no appreciable JVD Respiratory: Diminished to auscultation bilaterally, no wheezing, rales, rhonchi or crackles. Normal respiratory effort and patient is not tachypenic. No accessory muscle use.  Unlabored breathing Cardiovascular: RRR, no murmurs / rubs / gallops.  Has a 3 out of 6 systolic murmur.  Has lower extremity edema in the setting of her lymphedema Abdomen: Soft, non-tender, distended secondary to body habitus. Bowel sounds positive.  GU: Deferred. Musculoskeletal: No clubbing / cyanosis of digits/nails. No joint deformity upper and lower extremities.   Skin: No  rashes, lesions, ulcers on limited skin evaluation but has lower extremity changes from her venous stasis and lymphedema. No induration; Warm and dry.  Neurologic: CN 2-12 grossly intact with no focal deficits. Romberg sign and cerebellar reflexes not assessed.  Psychiatric: Normal judgment and insight. Alert and oriented x 3. Normal mood and appropriate affect.   Data Reviewed: I have personally reviewed following labs and imaging studies  CBC: Recent Labs  Lab 03/22/21 1932 03/23/21 0547  WBC 20.3* 22.7*  NEUTROABS 16.9*  --   HGB 17.1* 14.0  HCT 53.4* 42.5  MCV 87.0 84.8  PLT 257 093   Basic Metabolic Panel: Recent Labs  Lab 03/22/21 1932 03/23/21 0547  NA 134* 135  K 3.2* 2.9*  CL 96* 100  CO2 24 25  GLUCOSE 146* 112*  BUN 35* 34*  CREATININE 2.01* 1.73*  CALCIUM 9.7 9.2   GFR: Estimated Creatinine Clearance: 36.8 mL/min (A) (by C-G formula based on SCr of 1.73 mg/dL (H)). Liver Function Tests: Recent Labs  Lab 03/22/21 1932  AST 25  ALT 17  ALKPHOS 64  BILITOT 1.0  PROT 7.1  ALBUMIN 3.5   Recent Labs  Lab 03/22/21 1932  LIPASE 24   No results for input(s): AMMONIA in the last 168 hours. Coagulation Profile: Recent Labs  Lab 03/20/21 1543 03/22/21 1932 03/23/21 0547  INR 7.8* 6.4* 6.5*   Cardiac Enzymes: No results for input(s): CKTOTAL, CKMB, CKMBINDEX, TROPONINI in the last 168 hours. BNP (last 3 results) Recent Labs    06/06/20 0903  PROBNP 1,079*   HbA1C: No results for input(s): HGBA1C in the last 72 hours. CBG: Recent Labs  Lab 03/22/21 1931 03/23/21 0619  GLUCAP 173* 120*   Lipid Profile: No results for input(s): CHOL, HDL, LDLCALC, TRIG, CHOLHDL, LDLDIRECT in the last 72 hours. Thyroid Function Tests: Recent Labs    03/23/21 0547  TSH 1.908   Anemia Panel: No results for input(s): VITAMINB12, FOLATE, FERRITIN, TIBC, IRON, RETICCTPCT in the last 72 hours. Sepsis Labs: No results for input(s): PROCALCITON, LATICACIDVEN  in the last 168 hours.  Recent Results (from the past 240 hour(s))  SARS CORONAVIRUS 2 (TAT 6-24 HRS) Nasopharyngeal Nasopharyngeal Swab     Status: None   Collection Time: 03/22/21  9:55 PM   Specimen: Nasopharyngeal Swab  Result Value Ref Range Status   SARS Coronavirus 2 NEGATIVE NEGATIVE Final    Comment: (NOTE) SARS-CoV-2 target nucleic acids are NOT DETECTED.  The SARS-CoV-2 RNA is generally detectable in upper and lower respiratory specimens during the acute phase of  infection. Negative results do not preclude SARS-CoV-2 infection, do not rule out co-infections with other pathogens, and should not be used as the sole basis for treatment or other patient management decisions. Negative results must be combined with clinical observations, patient history, and epidemiological information. The expected result is Negative.  Fact Sheet for Patients: SugarRoll.be  Fact Sheet for Healthcare Providers: https://www.woods-mathews.com/  This test is not yet approved or cleared by the [place] FDA and  has been authorized for detection and/or diagnosis of SARS-CoV-2 by FDA under an Emergency Use Authorization (EUA). This EUA will remain  in effect (meaning this test can be used) for the duration of the COVID-19 declaration under Se ction 564(b)(1) of the Act, 21 U.S.C. section 360bbb-3(b)(1), unless the authorization is terminated or revoked sooner.  Performed at [facility], [place] [address] [place], [place] [address]   MRSA Next Gen by PCR, Nasal     Status: Abnormal   Collection Time: 03/23/21  1:32 AM   Specimen: Nasal Mucosa; Nasal Swab  Result Value Ref Range Status   MRSA by PCR Next Gen DETECTED (A) NOT DETECTED Final    Comment: RESULT CALLED TO, READ BACK BY AND VERIFIED WITH: A. TEPE,RN 0430 03/23/2021 T. TYSOR (NOTE) The GeneXpert MRSA Assay (FDA approved for NASAL specimens only), is one component of a  comprehensive MRSA colonization surveillance program. It is not intended to diagnose MRSA infection nor to guide or monitor treatment for MRSA infections. Test performance is not FDA approved in patients less than 35 years old. Performed at [facility], [place] [address], [place], [place] [address]     RN Pressure Injury Documentation:     Estimated body mass index is 46.03 kg/m as calculated from the following:   Height as of this encounter: 5\' 1"  (1.549 m).   Weight as of this encounter: 110.5 kg.  Malnutrition Type:  Malnutrition Characteristics:  Nutrition Interventions:  Radiology Studies: CT Head Wo Contrast  Result Date: 03/22/2021 CLINICAL DATA:  Near syncope. EXAM: CT HEAD WITHOUT CONTRAST TECHNIQUE: Contiguous axial images were obtained from the base of the skull through the vertex without intravenous contrast. COMPARISON:  September 2040 12/28/2018 and MRI June 04, 2019 FINDINGS: Brain: No evidence of acute large vascular territory infarction, hemorrhage, hydrocephalus, extra-axial collection or mass lesion/mass effect. Vascular: No hyperdense vessel or unexpected calcification. Skull: Normal. Negative for fracture or focal lesion. Sinuses/Orbits: The visualized portions of the paranasal sinuses and mastoid air cells are predominantly clear. Orbits are grossly unremarkable. Other: Streak artifact from dental hardware. IMPRESSION: No acute intracranial findings. Electronically Signed   By: Dahlia Bailiff MD   On: 03/22/2021 20:05    Scheduled Meds:  buPROPion  150 mg Oral Daily   DULoxetine  60 mg Oral Daily   mupirocin ointment  1 application Nasal BID   oxybutynin  5 mg Oral QPM   potassium chloride  10 mEq Oral BID   pravastatin  40 mg Oral q1800   sodium chloride flush  3 mL Intravenous Q12H   Continuous Infusions:   LOS: 0 days   Kerney Elbe, DO [facility] PAGER is on AMION  If 7PM-7AM, please contact  night-coverage [email or web address]

## 2021-03-23 NOTE — Evaluation (Signed)
Occupational Therapy Evaluation Patient Details Name: [name] MRN: [id number] DOB: 1956-01-26 Today's Date: 03/23/2021    History of Present Illness 65 y.o. female presenting to ED 7/14 with presyncopal episodes and recurrent falls x3 in 2 days. Patient admitted with presyncope/weakness secondary to hypotension and AKI. Labs (+) supratherapeutic INR w/hx of aortic valve repair. PMHx significant for aortic stenosis s/p TAVR on warfarin, chronic diastolic CHF, HTN, TIA, OSA on BiPAP.   Clinical Impression   PTA patient was living with her spouse in a private residence and was requiring some assist with LB ADLs at baseline. Patient reports I with med management and shared responsibility with husband for IADLs including meal prep. Patient currently functioning below baseline requiring Min A for sit to stand transfers, Min A for ADL transfers and Mod A for LB ADLs. Patient also limited by deficits listed below including LLE edema/pain, generalized weakness, and decreased static/dynamic balance and would benefit from continued acute OT services in prep for safe d/c to next level of care. Patient not yet ready to make a decision on d/c plan with request to "see how the next few days go". Patient admitted under observation status at this time. Recommendation for return home with [place] and 24hr supervision/assist from family. If family is unable to provide necessary level of assist, recommend short-term SNF rehab. Patient reports pleasant experience at [facility] after previous admission several months ago. OT will continue to follow acutely.      Follow Up Recommendations  Home health OT;Supervision/Assistance - 24 hour;SNF    Equipment Recommendations  None recommended by OT    Recommendations for Other Services       Precautions / Restrictions Precautions Precautions: Fall Restrictions Weight Bearing Restrictions: No      Mobility Bed Mobility Overal bed mobility: Needs Assistance Bed  Mobility: Supine to Sit     Supine to sit: Supervision     General bed mobility comments: Supervision A with HOB elevated; +rail; completes task in reasonable amount of time.    Transfers Overall transfer level: Needs assistance Equipment used: 1 person hand held assist Transfers: Sit to/from [facility] Sit to Stand: Min guard Stand pivot transfers: Min assist       General transfer comment: Min guard for sit to stand from EOB positioned in lowest setting and Min A for stand-pivot to recliner on R secondary to pain in LLE.    Balance Overall balance assessment: Needs assistance Sitting-balance support: Feet supported Sitting balance-Leahy Scale: Good     Standing balance support: Single extremity supported;During functional activity Standing balance-Leahy Scale: Poor Standing balance comment: Able to maintain static standing balance with unilateral UE support; Min A for steps to recliner with HHA +1.                           ADL either performed or assessed with clinical judgement   ADL Overall ADL's : Needs assistance/impaired                 Upper Body Dressing : Set up;Sitting   Lower Body Dressing: Moderate assistance;Sit to/from stand Lower Body Dressing Details (indicate cue type and reason): Max A to don footwear seated EOB. Patient reports assist for LB dressing at baseline. Toilet Transfer: Minimal Print production planner Details (indicate cue type and reason): Simulated with stand-pivot to recliner with HHA.           General ADL Comments: Patient greatly limited by  generalized weakness and pain in LLE s/p fall.     Vision   Vision Assessment?: No apparent visual deficits     Perception     Praxis      Pertinent Vitals/Pain Pain Assessment: Faces Faces Pain Scale: Hurts little more Pain Location: L thigh Pain Descriptors / Indicators: Aching;Sore Pain Intervention(s): Limited activity within patient's  tolerance;Monitored during session;Repositioned;Heat applied;Other (comment) (K pad in place)     Hand Dominance Right   Extremity/Trunk Assessment Upper Extremity Assessment Upper Extremity Assessment: Overall WFL for tasks assessed   Lower Extremity Assessment Lower Extremity Assessment: Defer to PT evaluation   Cervical / Trunk Assessment Cervical / Trunk Assessment: Other exceptions Cervical / Trunk Exceptions: Large body habitus   Communication Communication Communication: No difficulties   Cognition Arousal/Alertness: Awake/alert Behavior During Therapy: WFL for tasks assessed/performed Overall Cognitive Status: Within Functional Limits for tasks assessed                                     General Comments  Husband and daughter present at bedside; BP 110/62 in supine, 105/53 seated EOB and 118/65 in standing. Patient reports ringing in ears with standing after 2-3 minutes.    Exercises     Shoulder Instructions      Home Living Family/patient expects to be discharged to:: Private residence Living Arrangements: Spouse/significant other Available Help at Discharge: Family Type of Home: House Home Access: Stairs to enter Technical brewer of Steps: 2 Entrance Stairs-Rails: None Home Layout: One level     Bathroom Shower/Tub: Teacher, early years/pre: Standard     Home Equipment: Environmental consultant - 4 wheels;Tub bench;Hand held shower head;Adaptive equipment Adaptive Equipment: Reacher;Sock aid        Prior Functioning/Environment Level of Independence: Needs assistance  Gait / Transfers Assistance Needed: Intermittent use of rollator in home; rollator at all times for community mobility ADL's / Homemaking Assistance Needed: Spouse assists with LB bathing/dressing; shared responsibility for IADLs including cooking/cleaning            OT Problem List: Decreased strength;Decreased activity tolerance;Impaired balance (sitting and/or  standing)      OT Treatment/Interventions: Self-care/ADL training;Therapeutic exercise;Energy conservation;DME and/or AE instruction;Therapeutic activities;Patient/family education;Balance training    OT Goals(Current goals can be found in the care plan section) Acute Rehab OT Goals Patient Stated Goal: To decrease pain in LLE. OT Goal Formulation: With patient/family Time For Goal Achievement: 04/06/21 Potential to Achieve Goals: Good ADL Goals Pt Will Perform Grooming: with supervision;standing Pt Will Perform Upper Body Dressing: with supervision Pt Will Perform Lower Body Dressing: sit to/from stand;with min assist;with adaptive equipment Pt Will Transfer to Toilet: with supervision;ambulating Pt Will Perform Toileting - Clothing Manipulation and hygiene: with supervision;sit to/from stand Pt/caregiver will Perform Home Exercise Program: Increased ROM;Increased strength;With written HEP provided Additional ADL Goal #1: Patient will recall 3 energy conservation techniques in prep for ADLs/IADLs.  OT Frequency: Min 2X/week   Barriers to D/C: Decreased caregiver support  Reports family able to provide only PRN supervision/assist       Co-evaluation              AM-PAC OT "6 Clicks" Daily Activity     Outcome Measure Help from another person eating meals?: None Help from another person taking care of personal grooming?: A Little Help from another person toileting, which includes using toliet, bedpan, or urinal?: A Little Help from  another person bathing (including washing, rinsing, drying)?: A Lot Help from another person to put on and taking off regular upper body clothing?: A Little Help from another person to put on and taking off regular lower body clothing?: A Lot 6 Click Score: 17   End of Session Equipment Utilized During Treatment: Gait belt Nurse Communication: Mobility status  Activity Tolerance: Patient tolerated treatment well Patient left: in chair;with call  bell/phone within reach;with chair alarm set;with family/visitor present  OT Visit Diagnosis: Unsteadiness on feet (R26.81);Muscle weakness (generalized) (M62.81);Repeated falls (R29.6);Pain Pain - Right/Left: Left Pain - part of body: Leg                Time: 1037-1100 OT Time Calculation (min): 23 min Charges:  OT General Charges $OT Visit: 1 Visit OT Evaluation $OT Eval Low Complexity: 1 Low OT Treatments $Therapeutic Activity: 8-22 mins  Damariz Paganelli H. OTR/L Supplemental OT, Department of rehab services [phone]  Marbin Olshefski R H. 03/23/2021, 12:31 PM

## 2021-03-23 NOTE — Progress Notes (Signed)
RT NOTE:  Pt brought in home CPAP & mask, however, her mask was missing parts. Pt was unable to use our mask due to excessive leak caused by the patients home pressure of 22cm H2O. Pt will have family bring the missing piece tomorrow. Pt stated that she can sleep without her CPAP. RT available if desaturation or apneas are observed.

## 2021-03-24 DIAGNOSIS — N179 Acute kidney failure, unspecified: Secondary | ICD-10-CM | POA: Diagnosis not present

## 2021-03-24 DIAGNOSIS — I951 Orthostatic hypotension: Principal | ICD-10-CM

## 2021-03-24 DIAGNOSIS — I5032 Chronic diastolic (congestive) heart failure: Secondary | ICD-10-CM | POA: Diagnosis not present

## 2021-03-24 DIAGNOSIS — I9589 Other hypotension: Secondary | ICD-10-CM | POA: Diagnosis not present

## 2021-03-24 DIAGNOSIS — E876 Hypokalemia: Secondary | ICD-10-CM

## 2021-03-24 DIAGNOSIS — F32A Depression, unspecified: Secondary | ICD-10-CM | POA: Diagnosis not present

## 2021-03-24 LAB — PHOSPHORUS: Phosphorus: 2.6 mg/dL (ref 2.5–4.6)

## 2021-03-24 LAB — CBC WITH DIFFERENTIAL/PLATELET
Abs Immature Granulocytes: 0.06 10*3/uL (ref 0.00–0.07)
Basophils Absolute: 0.1 10*3/uL (ref 0.0–0.1)
Basophils Relative: 1 %
Eosinophils Absolute: 0.2 10*3/uL (ref 0.0–0.5)
Eosinophils Relative: 1 %
HCT: 37.4 % (ref 36.0–46.0)
Hemoglobin: 12 g/dL (ref 12.0–15.0)
Immature Granulocytes: 1 %
Lymphocytes Relative: 12 %
Lymphs Abs: 1.3 10*3/uL (ref 0.7–4.0)
MCH: 27.6 pg (ref 26.0–34.0)
MCHC: 32.1 g/dL (ref 30.0–36.0)
MCV: 86.2 fL (ref 80.0–100.0)
Monocytes Absolute: 1 10*3/uL (ref 0.1–1.0)
Monocytes Relative: 9 %
Neutro Abs: 8.5 10*3/uL — ABNORMAL HIGH (ref 1.7–7.7)
Neutrophils Relative %: 76 %
Platelets: 175 10*3/uL (ref 150–400)
RBC: 4.34 MIL/uL (ref 3.87–5.11)
RDW: 14.5 % (ref 11.5–15.5)
WBC: 11.1 10*3/uL — ABNORMAL HIGH (ref 4.0–10.5)
nRBC: 0 % (ref 0.0–0.2)

## 2021-03-24 LAB — COMPREHENSIVE METABOLIC PANEL
ALT: 15 U/L (ref 0–44)
AST: 25 U/L (ref 15–41)
Albumin: 2.9 g/dL — ABNORMAL LOW (ref 3.5–5.0)
Alkaline Phosphatase: 49 U/L (ref 38–126)
Anion gap: 7 (ref 5–15)
BUN: 28 mg/dL — ABNORMAL HIGH (ref 8–23)
CO2: 29 mmol/L (ref 22–32)
Calcium: 9.2 mg/dL (ref 8.9–10.3)
Chloride: 98 mmol/L (ref 98–111)
Creatinine, Ser: 1.43 mg/dL — ABNORMAL HIGH (ref 0.44–1.00)
GFR, Estimated: 40 mL/min — ABNORMAL LOW (ref >60)
Glucose, Bld: 117 mg/dL — ABNORMAL HIGH (ref 70–99)
Potassium: 3.2 mmol/L — ABNORMAL LOW (ref 3.5–5.1)
Sodium: 134 mmol/L — ABNORMAL LOW (ref 135–145)
Total Bilirubin: 0.4 mg/dL (ref 0.3–1.2)
Total Protein: 5.9 g/dL — ABNORMAL LOW (ref 6.5–8.1)

## 2021-03-24 LAB — PROTIME-INR
INR: 4.2 (ref 0.8–1.2)
Prothrombin Time: 40.1 seconds — ABNORMAL HIGH (ref 11.4–15.2)

## 2021-03-24 LAB — GLUCOSE, CAPILLARY: Glucose-Capillary: 108 mg/dL — ABNORMAL HIGH (ref 70–99)

## 2021-03-24 LAB — URINE CULTURE: Culture: 10000 — AB

## 2021-03-24 LAB — MAGNESIUM: Magnesium: 2 mg/dL (ref 1.7–2.4)

## 2021-03-24 MED ORDER — LACTATED RINGERS IV SOLN: INTRAVENOUS | Status: AC

## 2021-03-24 MED ORDER — POTASSIUM CHLORIDE CRYS ER 20 MEQ PO TBCR: 40.0000 meq | EXTENDED_RELEASE_TABLET | Freq: Two times a day (BID) | ORAL | Status: DC

## 2021-03-24 MED ORDER — SODIUM CHLORIDE 0.9 % IV BOLUS
1000.0000 mL | Freq: Once | INTRAVENOUS | Status: AC
  Administered 2021-03-24: 1000 mL via INTRAVENOUS

## 2021-03-24 MED ORDER — SENNOSIDES-DOCUSATE SODIUM 8.6-50 MG PO TABS
1.0000 | ORAL_TABLET | Freq: Two times a day (BID) | ORAL | Status: DC
  Administered 2021-03-24 – 2021-04-02 (×5): 1 via ORAL
  Filled 2021-03-24 (×12): qty 1

## 2021-03-24 MED ORDER — POLYETHYLENE GLYCOL 3350 17 G PO PACK
17.0000 g | PACK | Freq: Two times a day (BID) | ORAL | Status: DC
  Administered 2021-03-24 – 2021-03-26 (×3): 17 g via ORAL
  Filled 2021-03-24 (×11): qty 1

## 2021-03-24 MED ORDER — DICLOFENAC SODIUM 1 % EX GEL
2.0000 g | Freq: Four times a day (QID) | CUTANEOUS | Status: DC
  Administered 2021-03-24 – 2021-03-31 (×28): 2 g via TOPICAL
  Filled 2021-03-24: qty 100

## 2021-03-24 NOTE — Progress Notes (Signed)
PROGRESS NOTE    MALOREE [name]  ZJQ:[id number] DOB: April 21, 1956 DOA: 03/22/2021 PCP: [name], MD   Brief Narrative:  The patient is a morbidly obese 65 year old Caucasian female with a past medical history significant for but not limited to aortic stenosis status post TAVR currently on anticoagulation with warfarin, chronic diastolic CHF, hypertension, history of TIA, history of obstructive sleep apnea on BiPAP as well as other comorbidities who presented to the hospital with presyncopal episodes.  She reports that for the last 2 to 3 days she has had falls 3 different times.  She states the falls happen when she is walking across the room and states that her "head feels funny" and states that her immediately feels like her legs give out from underneath her.  She tried using her Rollator yesterday to support her but had a third fall with similar symptoms.  She denies any room spinning sensation.  No loss of consciousness.  She did not hit her head.  She does take torsemide and states last week she had urinary symptoms that she lost about 6 pounds.  She was also noted to have a supratherapeutic INR of 7.8 at her PCP office on 03/13/2021 and she was asked to hold her Coumadin for several days and had repeat on 7/12 which was still elevated at 7.8.  For last few days she has noticed some nosebleeds but denies any blood per rectum or any dark stools.  Approximately 3 weeks ago she was started on bupropion for depression.  In the ED she was noted to be hypotensive down to the 90s over 60s and had an elevated WBC of 20.3.  Creatinine was elevated from her baseline of 0.5 and she was admitted for an AKI.  Given her fall she had a head CT and was negative.  She was admitted for presyncope and weakness in the setting of hypotension and dehydration likely suspected to overdiuresis with torsemide as well as for AKI.   AKI is improving with IVF Hydration and PT/OT recommending Home Health. Patient was  Orthostatic so will give her another 1 Liter of IVF and TED hose and continue Maintenance IVF.   Assessment & Plan:   Principal Problem:   Hypotension Active Problems:   H/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   Sleep apnea   Chronic diastolic CHF (congestive heart failure) (HCC)   Weakness   Depression   AKI (acute kidney injury) ([place])   Supratherapeutic INR  Presyncope/weakness secondary to hypotension in the setting Of Orthostatic Hypotension -Suspect that patient is over diuresed with her torsemide causing her to be volume depleted and hypotensive -Has received 1.5 L normal saline fluid in the ED. Give an additional 1 Liter bolus today and continue Maintenance IVF and add TED Hose -Hold antihypertensives -Check orthostatic vital signs; she still remains on the softer side with her blood pressure so we will continue to hold her antihypertensives. She was Positive for Orthostatic Hypotension as her BP dropped from 113/79 Lying -> 105/72 Sitting -> 78/58 Standing  -Troponin was mildly elevated but flat at 53 and then repeat was 52 -Obtain PT and OT to further evaluate and treat and recommending Home Health    AKI, improving  -Suspect due to overdiuresis with torsemide -Baseline creatinine is around 0.8; she presented with a BUNs/creatinine 35/2.01 and trending down 34/1.73 -> 28/1.43 -Avoid nephrotoxic medications, contrast dyes, hypotension and renally adjust medications -Follow-up with repeat labs after IV fluid resuscitation -Continue monitor and trend  renal function carefully repeat CMP in a.m.  Hypokalemia -Patient's potassium was 2.9 and improved to 3.2 -Replete with p.o. potassium chloride 40 mg twice agaom -Checked Mag Level and was 2.0 -Continue monitor and replete as necessary -Repeat CMP in a.m.   Supratherapeutic INR w/hx of aortic stenosis status post AVR  -INR 6.4 on admission and repeat was 6.5 yesterday; Now it is 4.2 -Suspect could be due to her AKI.   Only new medication was bupropion 2 weeks ago which is unlikely to cause. -Has had 2 days of nosebleed but no other significant findings of bleeding.  Hemoglobin is stable.  We will hold Coumadin without reversal at this time but if she becomes symptomatic or actively bleeding we will give her vitamin K and possibly FFP -Check INR daily   Chronic Diastolic heart failure -Continue to Monitor closely while receiving fluids -Currently appears dry and family thinks that her legs are looking the best they have usually been and this is usually what they are normally -Strict I's and O's and daily weights; Patient is +1.396 Liters  -Continue to monitor for signs and symptoms of volume overload  Erythrocytosis -Likely was hemoconcentrated on admission from dehydration  -Patient hemoglobin/hematocrit went from 17.1/53.4 -> 14.0/42.5 -> 12.0/37.4 -Continue monitor and trend and repeat CBC   OSA -Continue BiPAP   Depression -Continue Bupropion and Cymbalta   Hyperlipidemia -Continue with Pravastatin 40 mg p.o. daily   Leukocytosis, improving  -Unclear etiology but likely in the setting of hemoconcentration from dehydration from overdiuresis -Patient WBC has gone from 20.3 is now trended up to 22.7 yesterday and improving  -If persistently remains elevated we will do an infectious work-up and obtain a chest x-ray, urinalysis with urine culture as well as blood cultures -Continue to monitor off of Antibiotics and trend and repeat CBC  Morbid Obesity -Complicates overall prognosis and care -Estimated body mass index is 47.07 kg/m as calculated from the following:   Height as of this encounter: 5\' 1"  (1.549 m).   Weight as of this encounter: 113 kg. -Weight Loss and Dietary Counseling given   DVT prophylaxis: Holding Coumadin given Supratherapeutic INR Code Status: FULL CODE Family Communication: Discussed with Husband and Daughter at bedside  Disposition Plan: Pending further clinical  improvement and evaluation by PT/OT   Status is: Inpatient  Remains inpatient appropriate because:Unsafe d/c plan, IV treatments appropriate due to intensity of illness or inability to take PO, and Inpatient level of care appropriate due to severity of illness  Dispo: The patient is from: Home              Anticipated d/c is to: Home              Patient currently is not medically stable to d/c.   Difficult to place patient No  Consultants:  None  Procedures: None  Antimicrobials:  Anti-infectives (From admission, onward)    None        Subjective: Seen and examined at bedside and states that she is not feeling much better.  Still feels very weak.  No lightheadedness or dizziness resting.  No chest pain. Still complaining of left thigh pain. When she stood up she became extremely orthostatic.  No other concerns or complaints at this time.  Objective: Vitals:   03/23/21 2229 03/23/21 2300 03/24/21 0300 03/24/21 0726  BP: 120/63 (!) 119/56 (!) 109/52 (!) 104/52  Pulse: (!) 105 99 94 86  Resp: 18 15 15 19   Temp: 98.2 F (36.8  C) 98 F (36.7 C) 98.4 F (36.9 C) 97.6 F (36.4 C)  TempSrc: Oral Oral Oral Oral  SpO2: 94% 97% 97% 94%  Weight:   113 kg   Height:        Intake/Output Summary (Last 24 hours) at 03/24/2021 1303 Last data filed at 03/24/2021 1242 Gross per 24 hour  Intake 1101.71 ml  Output 1150 ml  Net -48.29 ml    Filed Weights   03/22/21 1819 03/23/21 0129 03/24/21 0300  Weight: 106.1 kg 110.5 kg 113 kg   Examination: Physical Exam:  Constitutional: WN/WD morbidly obese Caucasian female in NAD and appears calm and comfortable Eyes: Lids and conjunctivae normal, sclerae anicteric  ENMT: External Ears, Nose appear normal. Grossly normal hearing.  Neck: Appears normal, supple, no cervical masses, normal ROM, no appreciable thyromegaly; no JVD Respiratory: Diminished to auscultation bilaterally, no wheezing, rales, rhonchi or crackles. Normal  respiratory effort and patient is not tachypenic. No accessory muscle use.  Unlabored breathing Cardiovascular: RRR, no murmurs / rubs / gallops. S1 and S2 auscultated.  has some lower extremity in the setting of her chronic lymphedema Abdomen: Soft, n nontender, distended secondary body habitus.  Bowel sounds present.  GU: Deferred. Musculoskeletal: No clubbing / cyanosis of digits/nails. No joint deformity upper and lower extremities.  Skin: No rashes, lesions, ulcers on limited skin evaluation and left leg is wrapped. No induration; Warm and dry.  Neurologic: CN 2-12 grossly intact with no focal deficits. Romberg sign and cerebellar reflexes not assessed.  Psychiatric: Normal judgment and insight. Alert and oriented x 3. Normal mood and appropriate affect.   Data Reviewed: I have personally reviewed following labs and imaging studies  CBC: Recent Labs  Lab 03/22/21 1932 03/23/21 0547 03/24/21 0015  WBC 20.3* 22.7* 11.1*  NEUTROABS 16.9*  --  8.5*  HGB 17.1* 14.0 12.0  HCT 53.4* 42.5 37.4  MCV 87.0 84.8 86.2  PLT 257 225 620    Basic Metabolic Panel: Recent Labs  Lab 03/22/21 1932 03/23/21 0547 03/24/21 0015  NA 134* 135 134*  K 3.2* 2.9* 3.2*  CL 96* 100 98  CO2 24 25 29   GLUCOSE 146* 112* 117*  BUN 35* 34* 28*  CREATININE 2.01* 1.73* 1.43*  CALCIUM 9.7 9.2 9.2  MG  --   --  2.0  PHOS  --   --  2.6    GFR: Estimated Creatinine Clearance: 45.1 mL/min (A) (by C-G formula based on SCr of 1.43 mg/dL (H)). Liver Function Tests: Recent Labs  Lab 03/22/21 1932 03/24/21 0015  AST 25 25  ALT 17 15  ALKPHOS 64 49  BILITOT 1.0 0.4  PROT 7.1 5.9*  ALBUMIN 3.5 2.9*    Recent Labs  Lab 03/22/21 1932  LIPASE 24    No results for input(s): AMMONIA in the last 168 hours. Coagulation Profile: Recent Labs  Lab 03/20/21 1543 03/22/21 1932 03/23/21 0547 03/24/21 0749  INR 7.8* 6.4* 6.5* 4.2*    Cardiac Enzymes: No results for input(s): CKTOTAL, CKMB,  CKMBINDEX, TROPONINI in the last 168 hours. BNP (last 3 results) Recent Labs    06/06/20 0903  PROBNP 1,079*    HbA1C: No results for input(s): HGBA1C in the last 72 hours. CBG: Recent Labs  Lab 03/22/21 1931 03/23/21 0619 03/24/21 0612  GLUCAP 173* 120* 108*    Lipid Profile: No results for input(s): CHOL, HDL, LDLCALC, TRIG, CHOLHDL, LDLDIRECT in the last 72 hours. Thyroid Function Tests: Recent Labs    03/23/21 0547  TSH 1.908    Anemia Panel: No results for input(s): VITAMINB12, FOLATE, FERRITIN, TIBC, IRON, RETICCTPCT in the last 72 hours. Sepsis Labs: No results for input(s): PROCALCITON, LATICACIDVEN in the last 168 hours.  Recent Results (from the past 240 hour(s))  Urine Culture     Status: Abnormal   Collection Time: 03/22/21  6:56 PM   Specimen: Urine, Clean Catch  Result Value Ref Range Status   Specimen Description URINE, CLEAN CATCH  Final   Special Requests NONE  Final   Culture (A)  Final    10,000 COLONIES/mL LACTOBACILLUS SPECIES Standardized susceptibility testing for this organism is not available. Performed at [facility], [place] [address] [place], [place] [address]    Report Status 03/24/2021 FINAL  Final  SARS CORONAVIRUS 2 (TAT 6-24 HRS) Nasopharyngeal Nasopharyngeal Swab     Status: None   Collection Time: 03/22/21  9:55 PM   Specimen: Nasopharyngeal Swab  Result Value Ref Range Status   SARS Coronavirus 2 NEGATIVE NEGATIVE Final    Comment: (NOTE) SARS-CoV-2 target nucleic acids are NOT DETECTED.  The SARS-CoV-2 RNA is generally detectable in upper and lower respiratory specimens during the acute phase of infection. Negative results do not preclude SARS-CoV-2 infection, do not rule out co-infections with other pathogens, and should not be used as the sole basis for treatment or other patient management decisions. Negative results must be combined with clinical observations, patient history, and epidemiological information.  The expected result is Negative.  Fact Sheet for Patients: SugarRoll.be  Fact Sheet for Healthcare Providers: https://www.woods-mathews.com/  This test is not yet approved or cleared by the [place] FDA and  has been authorized for detection and/or diagnosis of SARS-CoV-2 by FDA under an Emergency Use Authorization (EUA). This EUA will remain  in effect (meaning this test can be used) for the duration of the COVID-19 declaration under Se ction 564(b)(1) of the Act, 21 U.S.C. section 360bbb-3(b)(1), unless the authorization is terminated or revoked sooner.  Performed at [facility], [place] [address] [place], [place] [address]   MRSA Next Gen by PCR, Nasal     Status: Abnormal   Collection Time: 03/23/21  1:32 AM   Specimen: Nasal Mucosa; Nasal Swab  Result Value Ref Range Status   MRSA by PCR Next Gen DETECTED (A) NOT DETECTED Final    Comment: RESULT CALLED TO, READ BACK BY AND VERIFIED WITH: A. TEPE,RN 0430 03/23/2021 T. TYSOR (NOTE) The GeneXpert MRSA Assay (FDA approved for NASAL specimens only), is one component of a comprehensive MRSA colonization surveillance program. It is not intended to diagnose MRSA infection nor to guide or monitor treatment for MRSA infections. Test performance is not FDA approved in patients less than 69 years old. Performed at [facility], [place] [address] [place], [place] [address]      RN Pressure Injury Documentation:     Estimated body mass index is 47.07 kg/m as calculated from the following:   Height as of this encounter: 5\' 1"  (1.549 m).   Weight as of this encounter: 113 kg.  Malnutrition Type:  Malnutrition Characteristics:  Nutrition Interventions:  Radiology Studies: CT Head Wo Contrast  Result Date: 03/22/2021 CLINICAL DATA:  Near syncope. EXAM: CT HEAD WITHOUT CONTRAST TECHNIQUE: Contiguous axial images were obtained from the base of the skull through the vertex  without intravenous contrast. COMPARISON:  September 2040 12/28/2018 and MRI June 04, 2019 FINDINGS: Brain: No evidence of acute large vascular territory infarction, hemorrhage,  hydrocephalus, extra-axial collection or mass lesion/mass effect. Vascular: No hyperdense vessel or unexpected calcification. Skull: Normal. Negative for fracture or focal lesion. Sinuses/Orbits: The visualized portions of the paranasal sinuses and mastoid air cells are predominantly clear. Orbits are grossly unremarkable. Other: Streak artifact from dental hardware. IMPRESSION: No acute intracranial findings. Electronically Signed   By: Dahlia Bailiff MD   On: 03/22/2021 20:05    Scheduled Meds:  buPROPion  150 mg Oral Daily   diclofenac Sodium  2 g Topical QID   DULoxetine  60 mg Oral Daily   mupirocin ointment  1 application Nasal BID   oxybutynin  5 mg Oral QPM   polyethylene glycol  17 g Oral BID   pravastatin  40 mg Oral q1800   senna-docusate  1 tablet Oral BID   sodium chloride flush  3 mL Intravenous Q12H   Continuous Infusions:  lactated ringers 75 mL/hr at 03/24/21 0721     LOS: 1 day   Kerney Elbe, DO [facility] PAGER is on [place]  If 7PM-7AM, please contact night-coverage [email or web address]

## 2021-03-24 NOTE — Plan of Care (Signed)
  Problem: Education: Goal: Knowledge of General Education information will improve Description: Including pain rating scale, medication(s)/side effects and non-pharmacologic comfort measures Outcome: Progressing   Problem: Health Behavior/Discharge Planning: Goal: Ability to manage health-related needs will improve Outcome: Progressing   Problem: Clinical Measurements: Goal: Diagnostic test results will improve Outcome: Progressing Goal: Cardiovascular complication will be avoided Outcome: Progressing   Problem: Nutrition: Goal: Adequate nutrition will be maintained Outcome: Progressing   Problem: Elimination: Goal: Will not experience complications related to urinary retention Outcome: Progressing

## 2021-03-24 NOTE — Evaluation (Signed)
Physical Therapy Evaluation Patient Details Name: [name] MRN: [id number] DOB: [birth year] Today's Date: 03/24/2021   History of Present Illness  65 y.o. female presenting to ED 7/14 with presyncopal episodes and recurrent falls x3 in 2 days. Patient admitted with presyncope/weakness secondary to hypotension and AKI. Labs (+) supratherapeutic INR w/hx of aortic valve repair. PMHx significant for aortic stenosis s/p TAVR on warfarin, chronic diastolic CHF, HTN, TIA, OSA on BiPAP.  Clinical Impression  Pt agreeable to PT and denies dizziness on arrival. Pt performs bed mobilitiy without requiring physical assistance, but requires physical assistance to perform transfers. Pt positive for orthostatics this session and reports muffled hearing with standing. Pt reports muffled hearing precedes her falls that have occurred in the home. At this time, pt is a safety concern with increased risk of falling due to positive orthostatics. Pt ambulates short household distances with rollator, without physical assistance and is limited by L thigh pain. Pt with deficits in overall strength, activity tolerance, balance, and gait and will benefit from continued acute PT to improve independence in mobility. SPT recommends HHPT to assist with improving activity tolerance and overall mobility. Pt will require assistance for all OOB for safety or will require rehab placement. Pt will benefit from additional acute PT sessions prior to discharge.    Follow Up Recommendations Home health PT;Supervision for mobility/OOB    Equipment Recommendations  Wheelchair (measurements PT);Wheelchair cushion (measurements PT);3in1 (PT)    Recommendations for Other Services       Precautions / Restrictions Precautions Precautions: Fall Precaution Comments: + orthostatics Restrictions Weight Bearing Restrictions: No      Mobility  Bed Mobility Overal bed mobility: Needs Assistance Bed Mobility: Supine to Sit     Supine  to sit: Supervision;HOB elevated     General bed mobility comments: Pt with use of railings and HOB elevated. Pt denies dizziness.    Transfers Overall transfer level: Needs assistance Equipment used: 4-wheeled walker Transfers: Sit to/from Stand Sit to Stand: Min assist         General transfer comment: min A to rise from EOB 1x and from recliner 1x. Pt denies dizziness despite + orthostatics. Pt reports she is able to tell she needs to return to sitting and indicates her symptoms include muffled hearing on standing. Pt reports this has occurred in the home immediately before each fall.  Ambulation/Gait Ambulation/Gait assistance: Min guard Gait Distance (Feet): 20 Feet Assistive device: 4-wheeled walker Gait Pattern/deviations: Step-to pattern;Step-through pattern;Decreased stride length;Antalgic;Trunk flexed Gait velocity: reduced Gait velocity interpretation: <1.8 ft/sec, indicate of risk for recurrent falls General Gait Details: Pt with slow antalgic gait, requiring seated rest break. Pt unable to participate in further mobility due to pain and fatigue  Stairs            Wheelchair Mobility    Modified Rankin (Stroke Patients Only)       Balance Overall balance assessment: Needs assistance Sitting-balance support: Feet supported Sitting balance-Leahy Scale: Good     Standing balance support: During functional activity Standing balance-Leahy Scale: Poor Standing balance comment: Pt reliant on bil UE support from rollator to stand                             Pertinent Vitals/Pain Pain Assessment: 0-10 Pain Score: 7  Pain Location: L thigh Pain Descriptors / Indicators: Aching;Sore Pain Intervention(s): Monitored during session;Limited activity within patient's tolerance    Home Living Family/patient expects to  be discharged to:: Private residence Living Arrangements: Spouse/significant other Available Help at Discharge: Family;Available 24  hours/day Type of Home: House Home Access: Stairs to enter Entrance Stairs-Rails: None Entrance Stairs-Number of Steps: 2 Home Layout: Multi-level;Able to live on main level with bedroom/bathroom Home Equipment: Shower seat;Walker - 4 wheels      Prior Function Level of Independence: Needs assistance   Gait / Transfers Assistance Needed: pt primarily mobilizes without AD in the home, uses rollator in the community.  ADL's / Homemaking Assistance Needed: pt requries assistance to get into/out of the tub, but is able to bathe and dress self.        Hand Dominance        Extremity/Trunk Assessment   Upper Extremity Assessment Upper Extremity Assessment: Defer to OT evaluation    Lower Extremity Assessment Lower Extremity Assessment: Generalized weakness    Cervical / Trunk Assessment Cervical / Trunk Assessment: Other exceptions Cervical / Trunk Exceptions: body habitus  Communication   Communication: No difficulties  Cognition Arousal/Alertness: Awake/alert Behavior During Therapy: WFL for tasks assessed/performed Overall Cognitive Status: Within Functional Limits for tasks assessed                                        General Comments General comments (skin integrity, edema, etc.): BP supine 113/71 HR 128; BP seated EOB 105/72 HR 108; BP standing 78/58 HR 126 with pt denying dizziness but reporting muffled hearing.    Exercises     Assessment/Plan    PT Assessment Patient needs continued PT services  PT Problem List Decreased strength;Decreased activity tolerance;Decreased balance;Decreased mobility;Decreased knowledge of use of DME;Decreased safety awareness;Decreased knowledge of precautions;Cardiopulmonary status limiting activity;Pain       PT Treatment Interventions DME instruction;Stair training;Gait training;Functional mobility training;Therapeutic activities;Therapeutic exercise;Balance training;Patient/family education;Wheelchair  mobility training    PT Goals (Current goals can be found in the Care Plan section)  Acute Rehab PT Goals Patient Stated Goal: manage pain PT Goal Formulation: With patient Time For Goal Achievement: 04/07/21 Potential to Achieve Goals: Good    Frequency Min 3X/week   Barriers to discharge        Co-evaluation               AM-PAC PT "6 Clicks" Mobility  Outcome Measure Help needed turning from your back to your side while in a flat bed without using bedrails?: None Help needed moving from lying on your back to sitting on the side of a flat bed without using bedrails?: None Help needed moving to and from a bed to a chair (including a wheelchair)?: A Little Help needed standing up from a chair using your arms (e.g., wheelchair or bedside chair)?: A Little Help needed to walk in hospital room?: A Little Help needed climbing 3-5 steps with a railing? : A Lot 6 Click Score: 19    End of Session Equipment Utilized During Treatment: Gait belt Activity Tolerance: Patient limited by pain;Patient limited by fatigue Patient left: in chair;with call bell/phone within reach;with chair alarm set Nurse Communication: Mobility status;Other (comment) (orthostatics) PT Visit Diagnosis: Unsteadiness on feet (R26.81);Other abnormalities of gait and mobility (R26.89);Repeated falls (R29.6);Muscle weakness (generalized) (M62.81);History of falling (Z91.81);Difficulty in walking, not elsewhere classified (R26.2);Pain Pain - Right/Left: Left Pain - part of body:  (thigh)    Time: 4742-5956 PT Time Calculation (min) (ACUTE ONLY): 48 min   Charges:  Acute Rehab  Pager: [phone]   Garwin Brothers, SPT  03/24/2021, 12:34 PM

## 2021-03-24 NOTE — Plan of Care (Signed)

## 2021-03-25 ENCOUNTER — Inpatient Hospital Stay (HOSPITAL_COMMUNITY): Payer: Medicare Other

## 2021-03-25 DIAGNOSIS — D649 Anemia, unspecified: Secondary | ICD-10-CM

## 2021-03-25 DIAGNOSIS — D72829 Elevated white blood cell count, unspecified: Secondary | ICD-10-CM

## 2021-03-25 DIAGNOSIS — N179 Acute kidney failure, unspecified: Secondary | ICD-10-CM | POA: Diagnosis not present

## 2021-03-25 DIAGNOSIS — I9589 Other hypotension: Secondary | ICD-10-CM | POA: Diagnosis not present

## 2021-03-25 DIAGNOSIS — E871 Hypo-osmolality and hyponatremia: Secondary | ICD-10-CM

## 2021-03-25 DIAGNOSIS — F32A Depression, unspecified: Secondary | ICD-10-CM | POA: Diagnosis not present

## 2021-03-25 DIAGNOSIS — I5032 Chronic diastolic (congestive) heart failure: Secondary | ICD-10-CM | POA: Diagnosis not present

## 2021-03-25 LAB — CBC WITH DIFFERENTIAL/PLATELET
Abs Immature Granulocytes: 0.04 10*3/uL (ref 0.00–0.07)
Basophils Absolute: 0.1 10*3/uL (ref 0.0–0.1)
Basophils Relative: 1 %
Eosinophils Absolute: 0.5 10*3/uL (ref 0.0–0.5)
Eosinophils Relative: 5 %
HCT: 29.9 % — ABNORMAL LOW (ref 36.0–46.0)
Hemoglobin: 9.5 g/dL — ABNORMAL LOW (ref 12.0–15.0)
Immature Granulocytes: 0 %
Lymphocytes Relative: 19 %
Lymphs Abs: 1.8 10*3/uL (ref 0.7–4.0)
MCH: 27.7 pg (ref 26.0–34.0)
MCHC: 31.8 g/dL (ref 30.0–36.0)
MCV: 87.2 fL (ref 80.0–100.0)
Monocytes Absolute: 1.1 10*3/uL — ABNORMAL HIGH (ref 0.1–1.0)
Monocytes Relative: 12 %
Neutro Abs: 5.7 10*3/uL (ref 1.7–7.7)
Neutrophils Relative %: 63 %
Platelets: 159 10*3/uL (ref 150–400)
RBC: 3.43 MIL/uL — ABNORMAL LOW (ref 3.87–5.11)
RDW: 14.6 % (ref 11.5–15.5)
WBC: 9.1 10*3/uL (ref 4.0–10.5)
nRBC: 0 % (ref 0.0–0.2)

## 2021-03-25 LAB — COMPREHENSIVE METABOLIC PANEL
ALT: 15 U/L (ref 0–44)
AST: 27 U/L (ref 15–41)
Albumin: 2.7 g/dL — ABNORMAL LOW (ref 3.5–5.0)
Alkaline Phosphatase: 41 U/L (ref 38–126)
Anion gap: 5 (ref 5–15)
BUN: 14 mg/dL (ref 8–23)
CO2: 27 mmol/L (ref 22–32)
Calcium: 9 mg/dL (ref 8.9–10.3)
Chloride: 104 mmol/L (ref 98–111)
Creatinine, Ser: 0.95 mg/dL (ref 0.44–1.00)
GFR, Estimated: >60 mL/min (ref >60)
Glucose, Bld: 106 mg/dL — ABNORMAL HIGH (ref 70–99)
Potassium: 3.7 mmol/L (ref 3.5–5.1)
Sodium: 136 mmol/L (ref 135–145)
Total Bilirubin: 0.7 mg/dL (ref 0.3–1.2)
Total Protein: 5.7 g/dL — ABNORMAL LOW (ref 6.5–8.1)

## 2021-03-25 LAB — MAGNESIUM: Magnesium: 2 mg/dL (ref 1.7–2.4)

## 2021-03-25 LAB — GLUCOSE, CAPILLARY: Glucose-Capillary: 103 mg/dL — ABNORMAL HIGH (ref 70–99)

## 2021-03-25 LAB — PROTIME-INR
INR: 3.7 — ABNORMAL HIGH (ref 0.8–1.2)
Prothrombin Time: 36.6 seconds — ABNORMAL HIGH (ref 11.4–15.2)

## 2021-03-25 LAB — PHOSPHORUS: Phosphorus: 2.6 mg/dL (ref 2.5–4.6)

## 2021-03-25 IMAGING — DX DG KNEE 1-2V*L*
2 series · 2 of 2 positions shown · non-contrast
Comparison: Femur radiograph earlier today.

CLINICAL DATA: Left knee bruising and swelling.

EXAM:
LEFT KNEE - 1-2 VIEW

[knee ap]
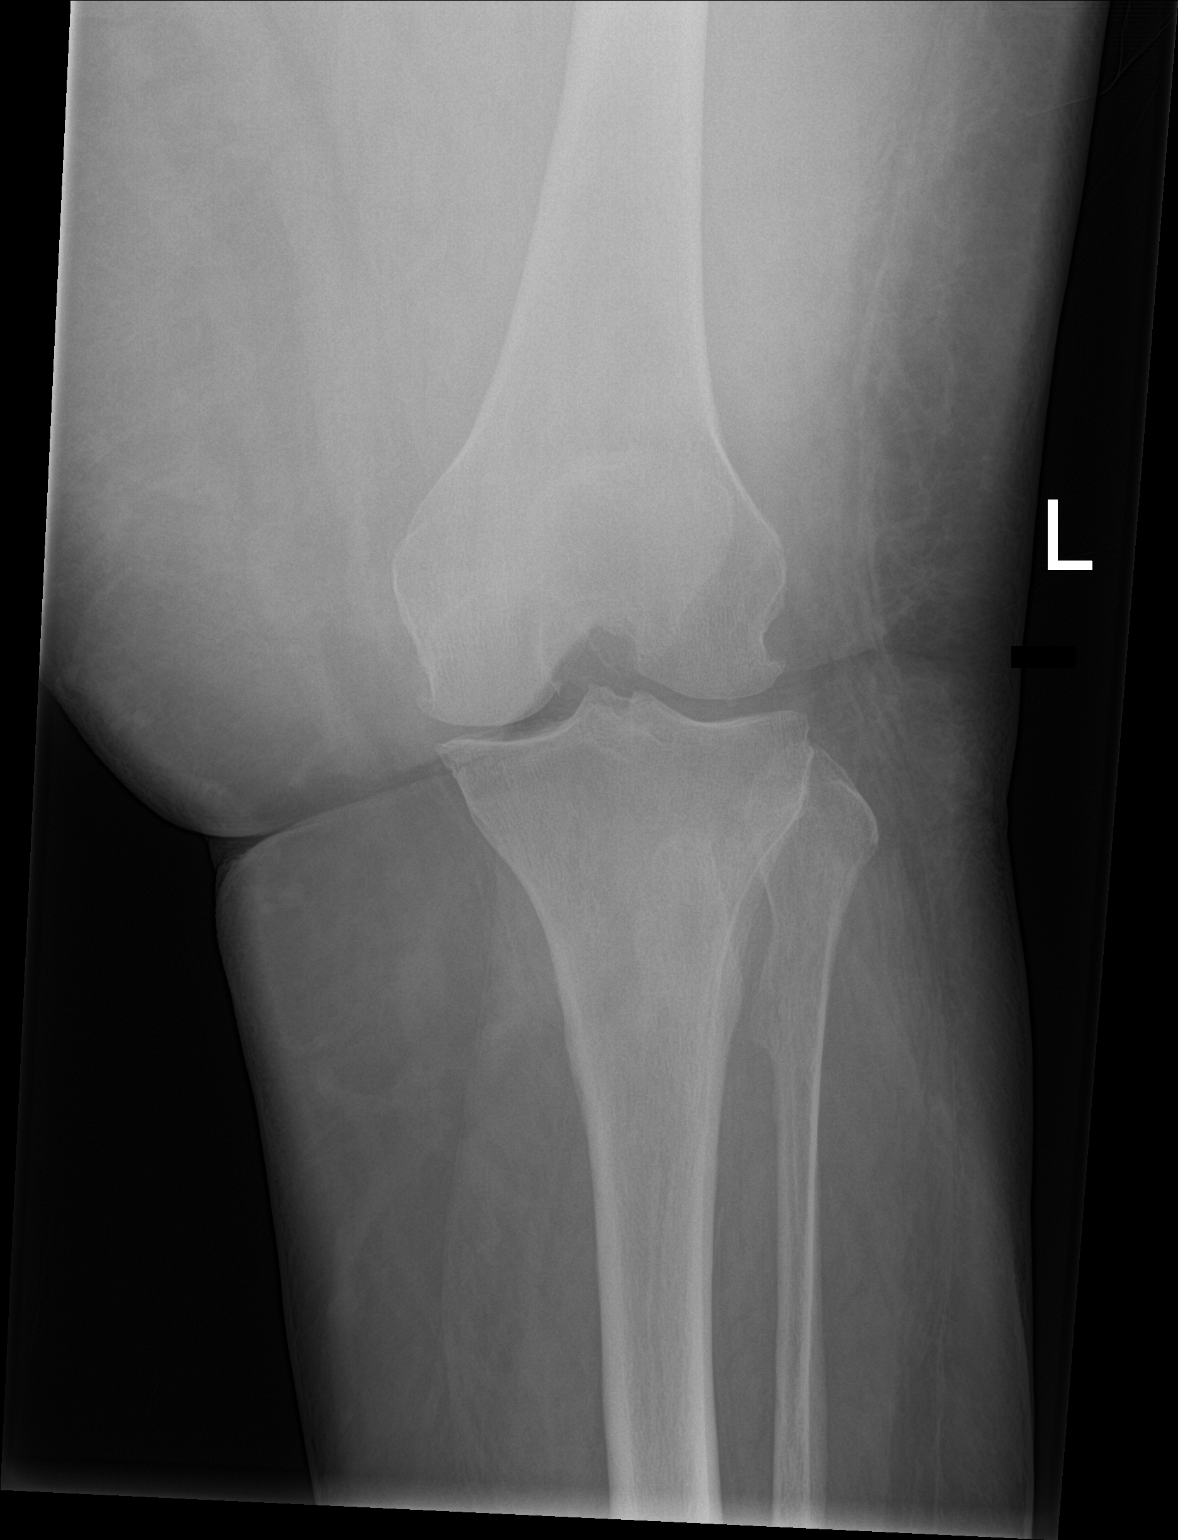

[knee lat]
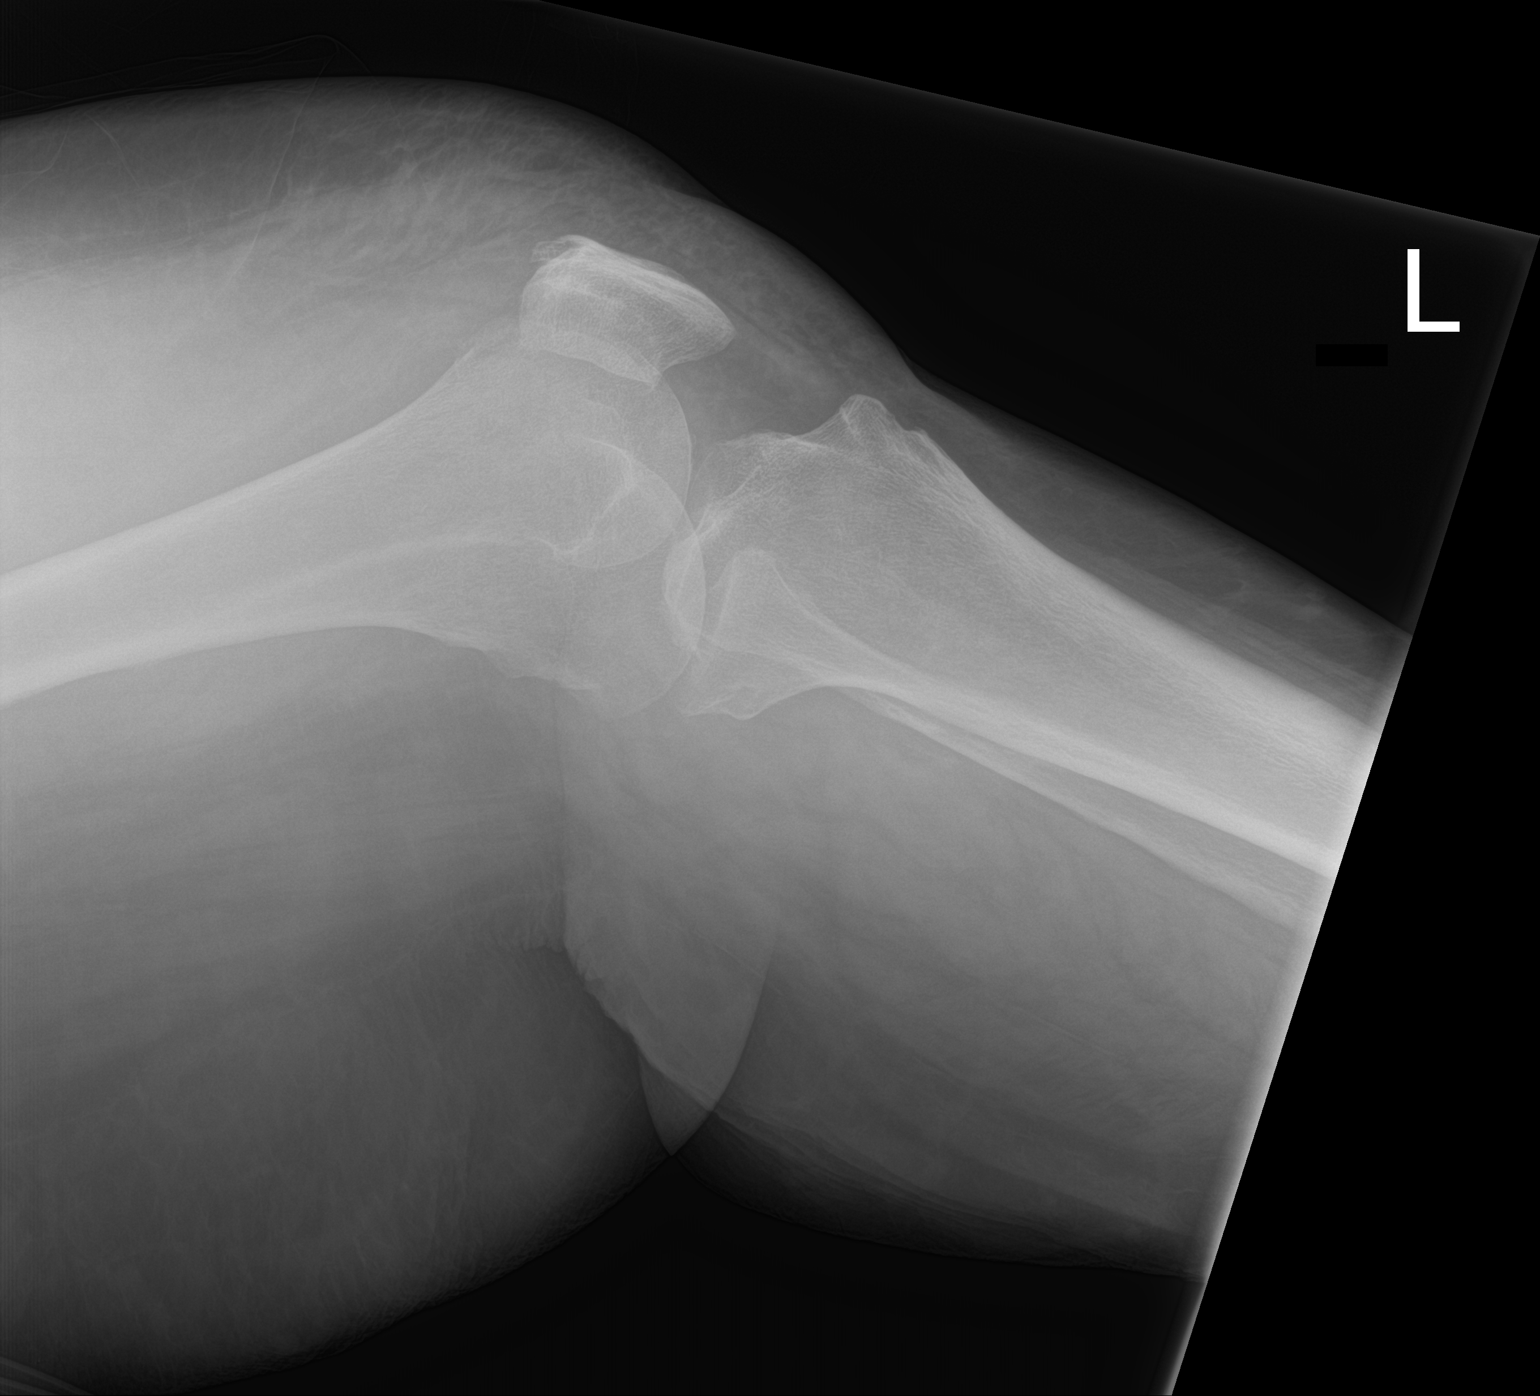

[2 of 2 positions shown; findings below may reference images not displayed]

FINDINGS: No fracture or dislocation. Mild medial compartment joint space
narrowing. Mild tricompartmental peripheral spurring. Limited
assessment for joint effusion on provided views. Soft tissue edema
anteriorly.
IMPRESSION: 1. Soft tissue edema without acute fracture or dislocation.
2. Mild tricompartmental osteoarthritis.

## 2021-03-25 IMAGING — CR DG FEMUR 2+V*L*
4 series · 4 of 4 positions shown · non-contrast
Comparison: None.

CLINICAL DATA: Fall, left leg pain

EXAM:
LEFT FEMUR 2 VIEWS

[femur ap (1 of 2)]
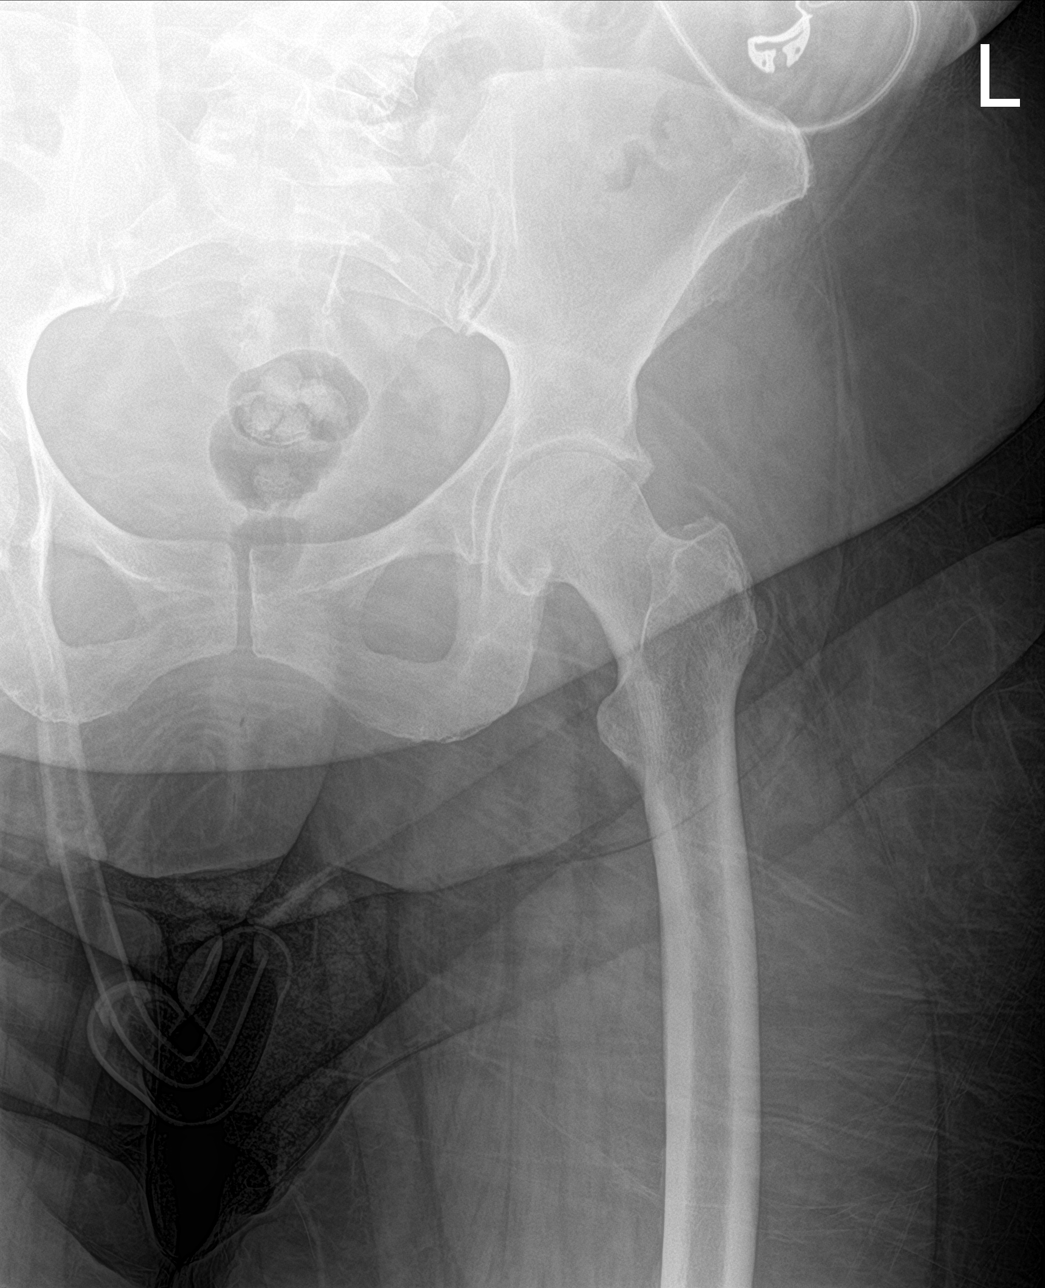

[femur ap (2 of 2)]
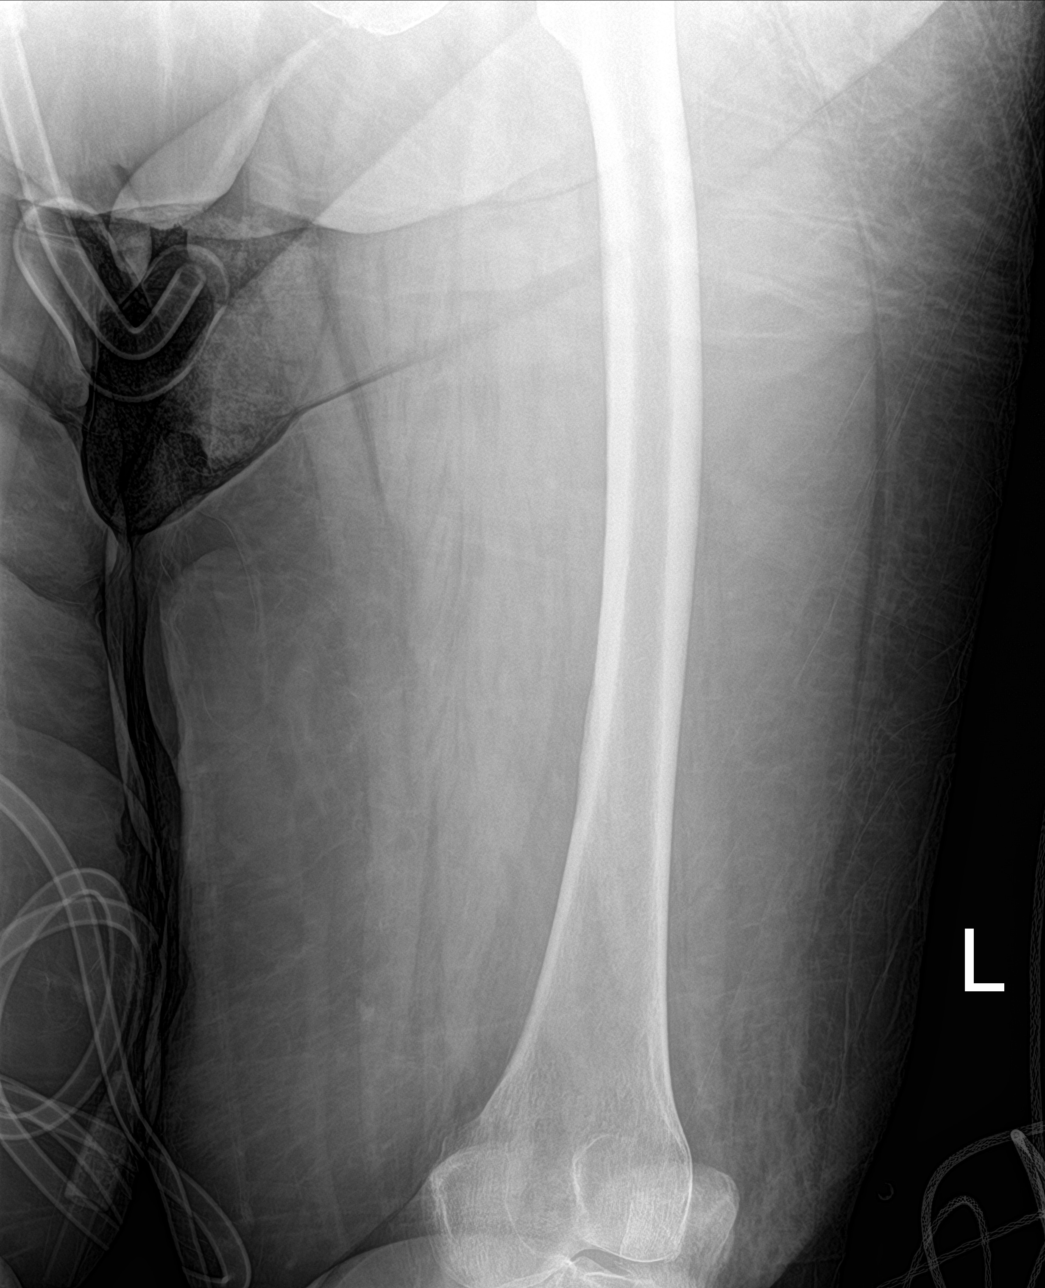

[femur lat (1 of 2)]
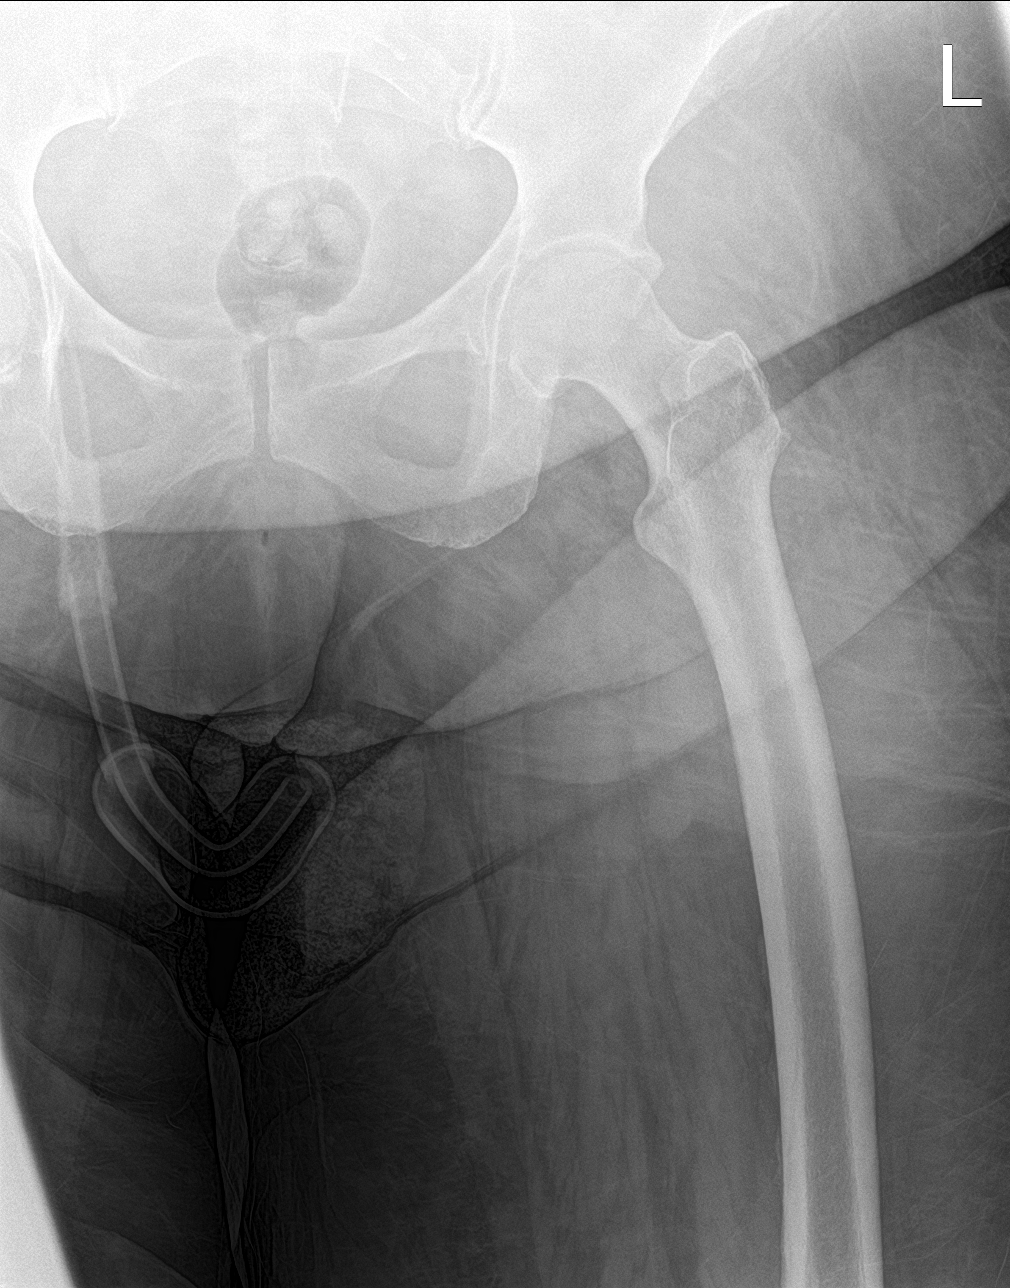

[femur lat (2 of 2)]
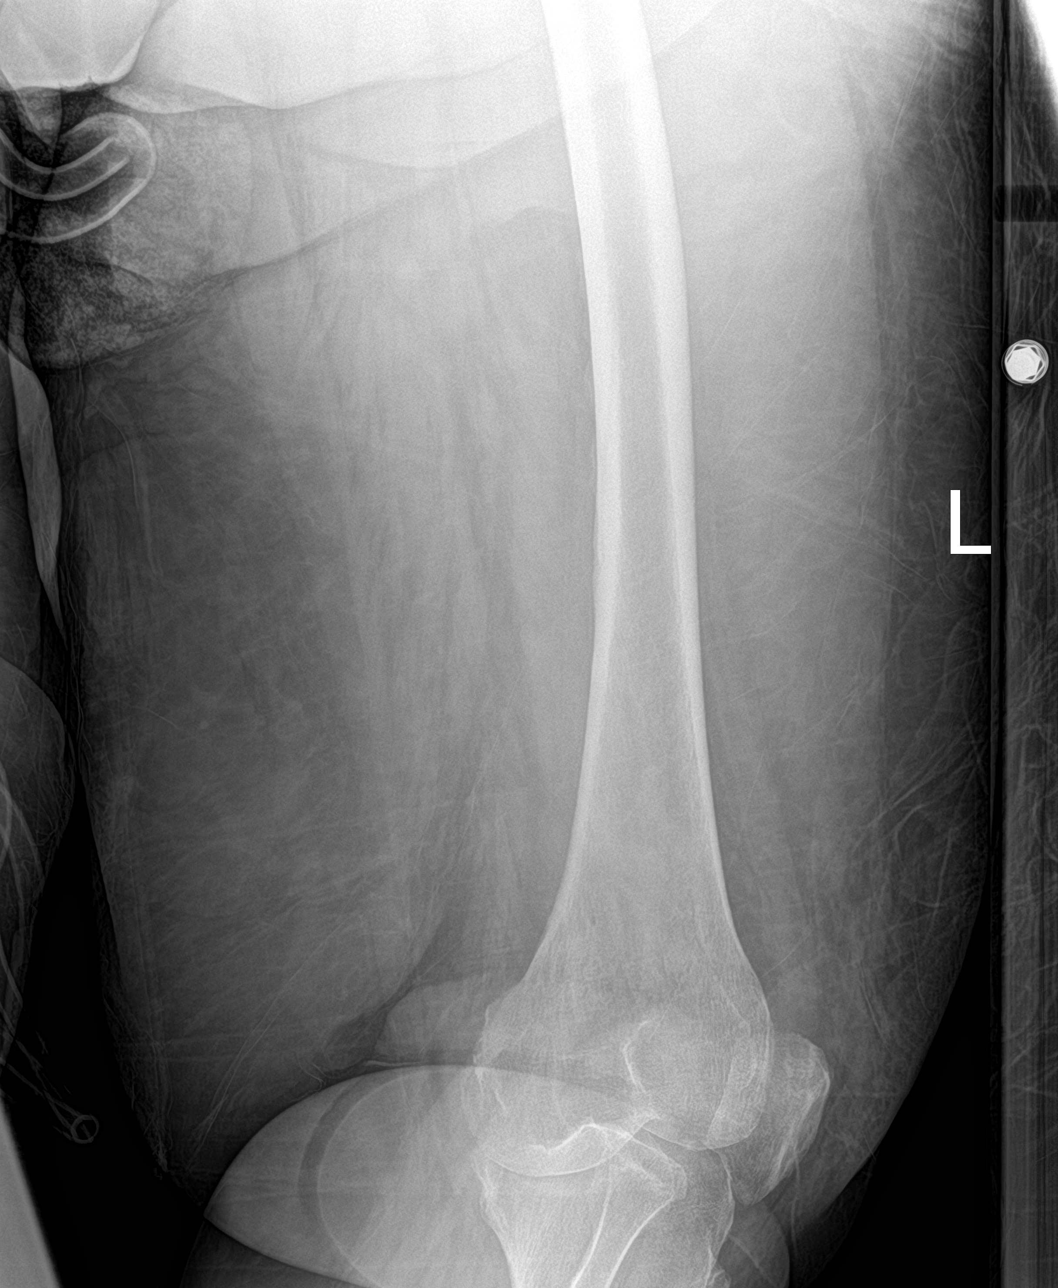

[4 of 4 positions shown; findings below may reference images not displayed]

FINDINGS: Normal alignment. No fracture or dislocation. Mild left hip
degenerative arthritis. Soft tissues are unremarkable.
IMPRESSION: No acute fracture or dislocation.

## 2021-03-25 IMAGING — US US EXTREM LOW*L* LIMITED
1 series · 7 of 7 positions shown · non-contrast
Comparison: Left femur x-rays from same day.

CLINICAL DATA: Recent fall.  Bruising around the left knee.

EXAM:
ULTRASOUND LEFT LOWER EXTREMITY LIMITED
TECHNIQUE: Ultrasound examination of the lower extremity soft tissues was
performed in the area of clinical concern.

[Series 1: us left lower extrem ltd soft tissue non vascular · 7 of 7 slices shown]
[im 1/7]
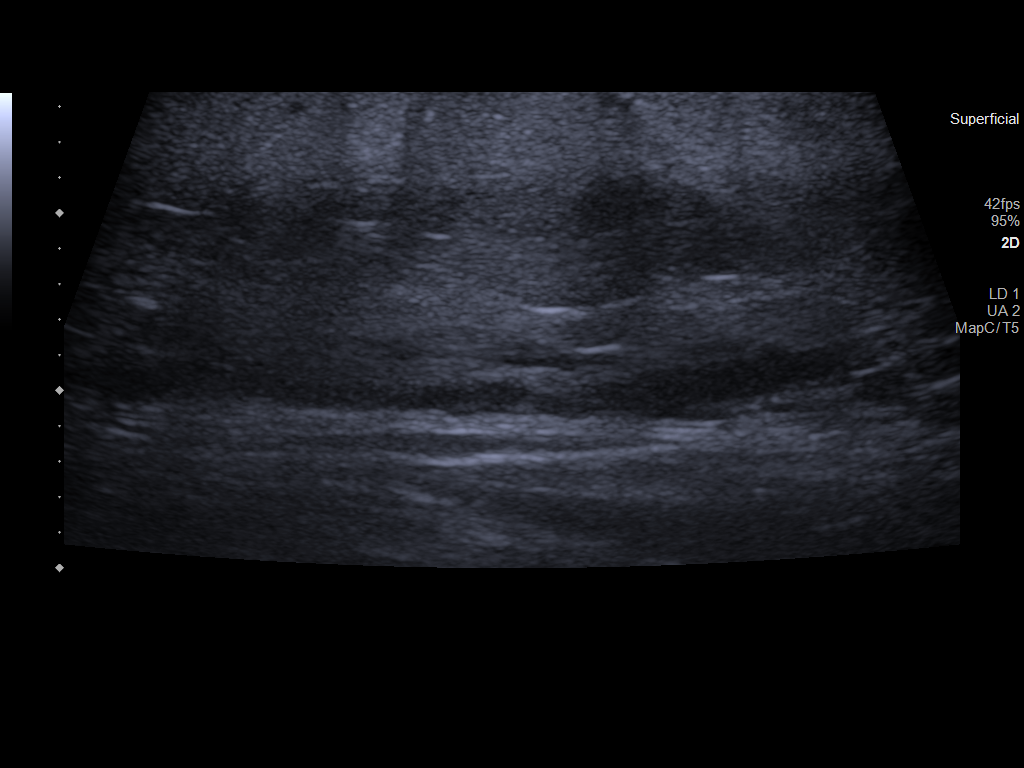
[im 2/7]
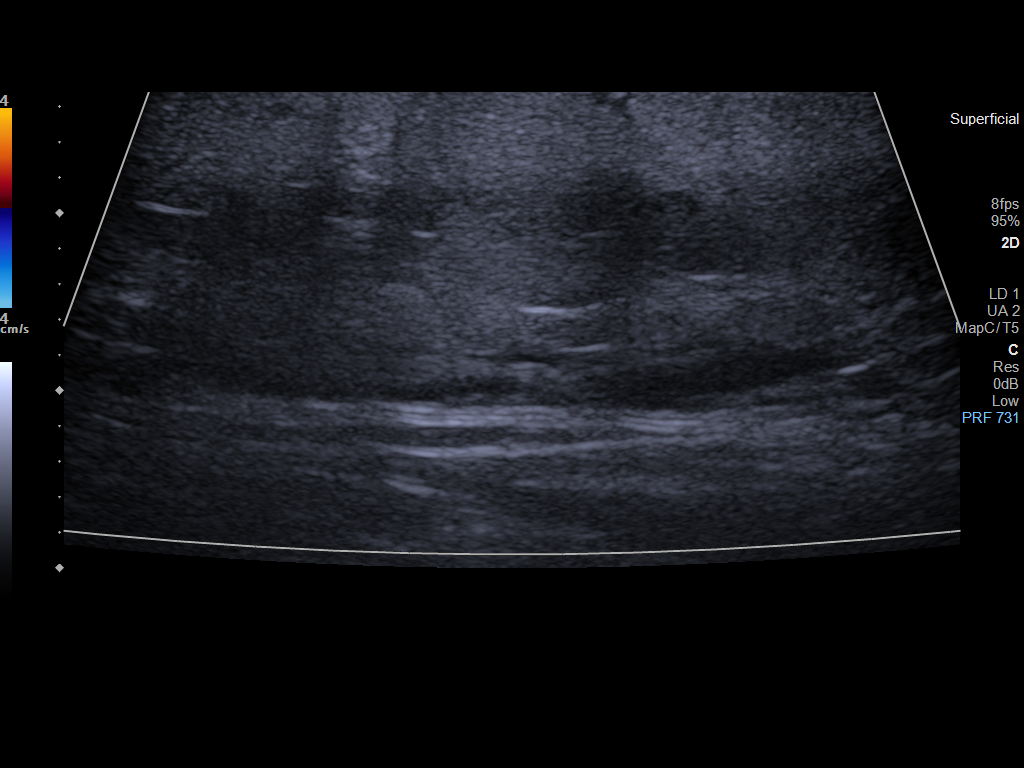
[im 3/7]
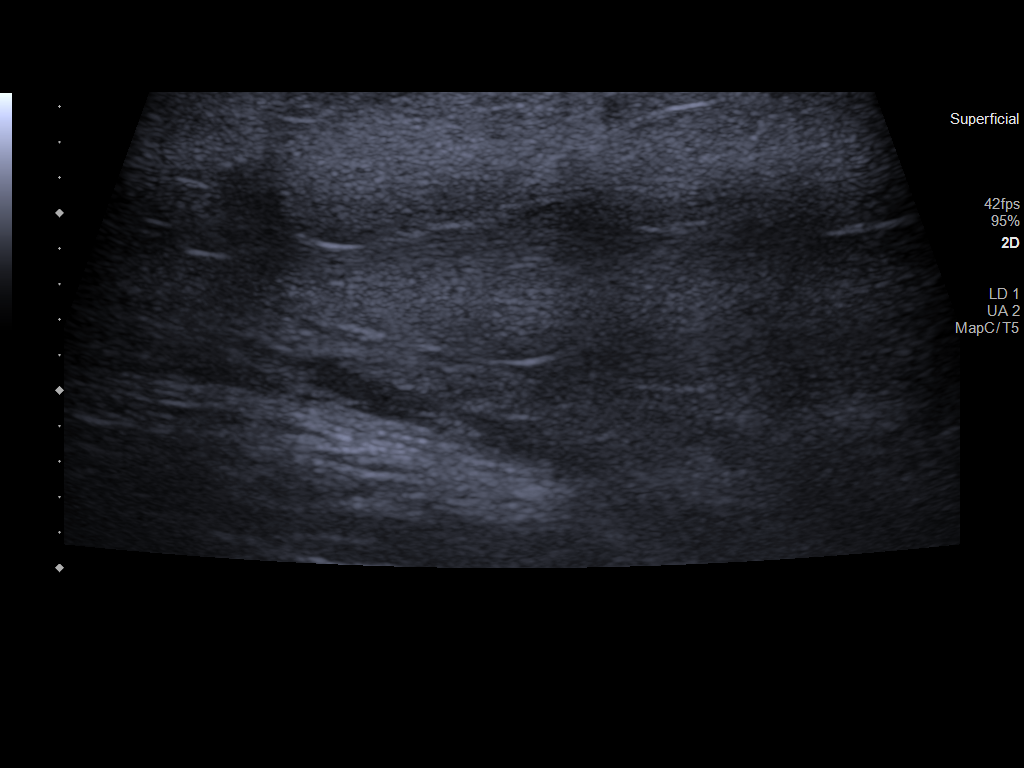
[im 4/7]
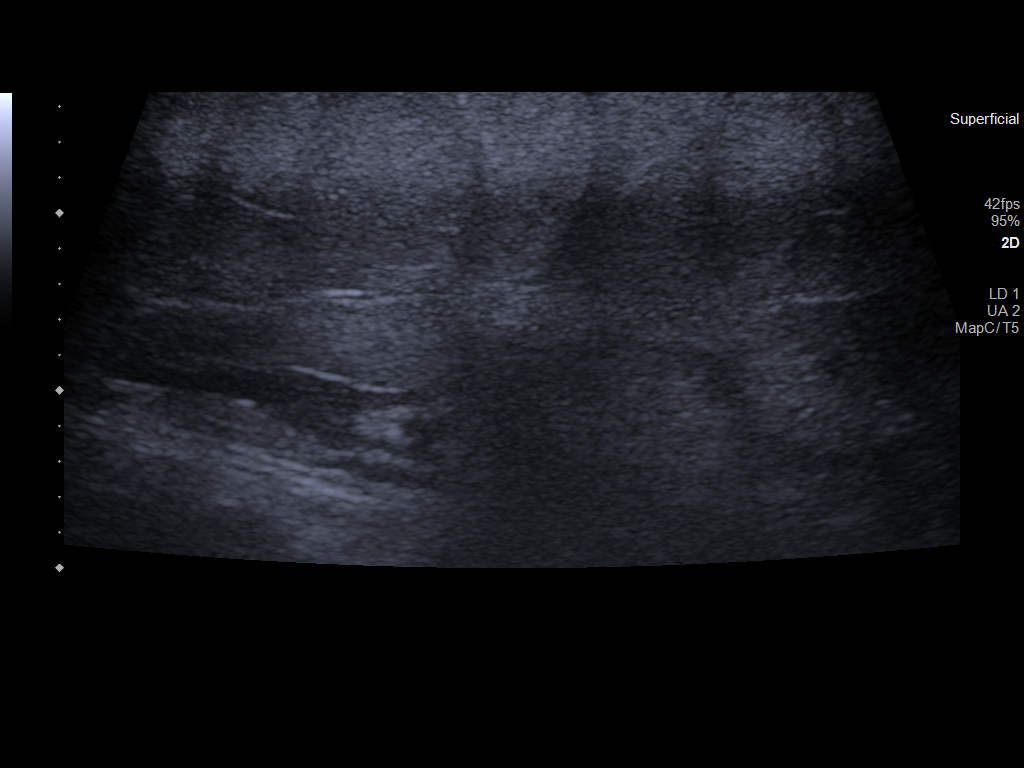
[im 5/7]
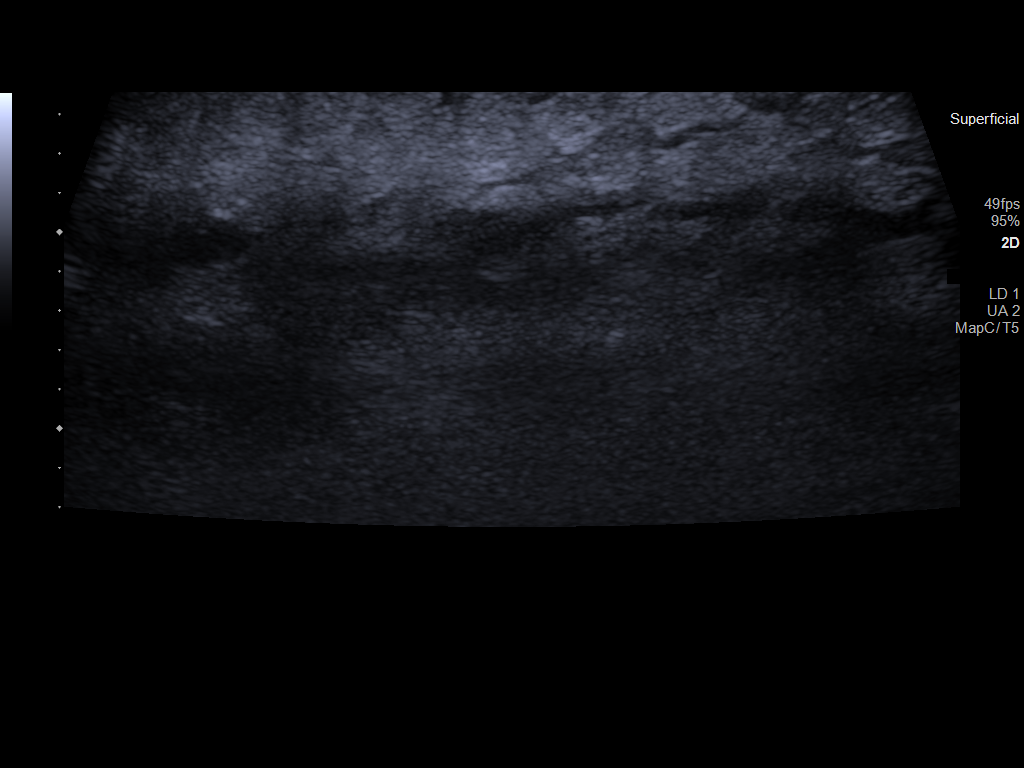
[im 6/7]
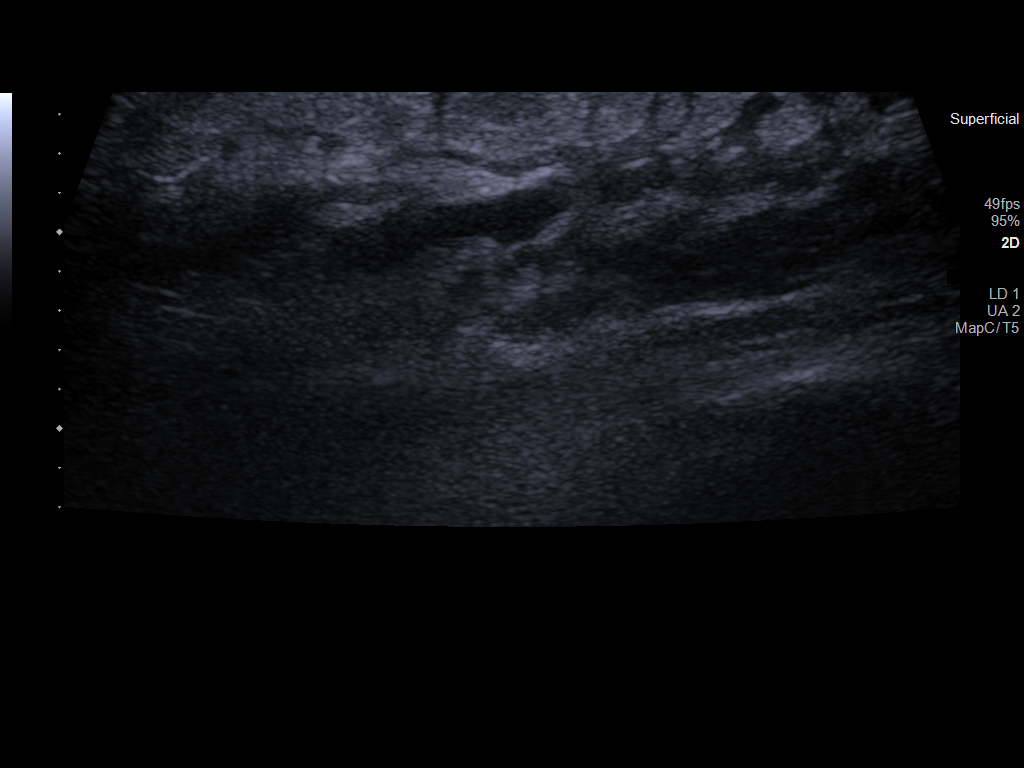
[im 7/7]
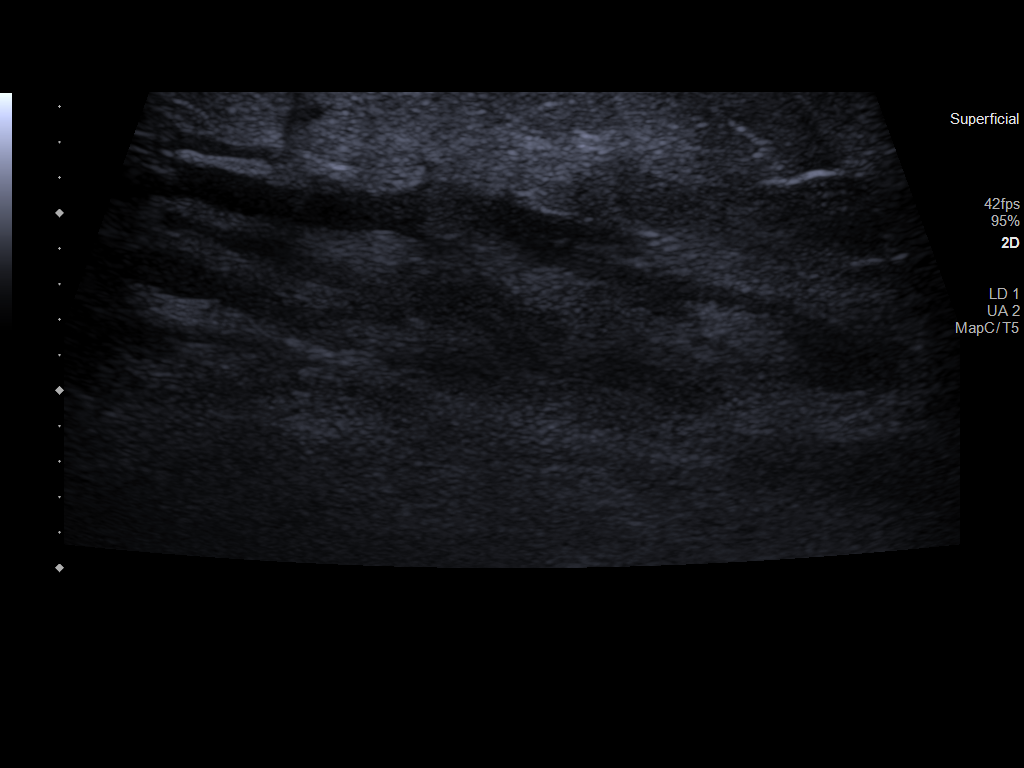

[7 of 7 positions shown; findings below may reference images not displayed]

FINDINGS: Focused ultrasound of the area of concern along the left lateral
thigh demonstrates soft tissue swelling without discrete fluid
collection or hematoma. No soft tissue mass.
IMPRESSION: 1. Soft tissue swelling without evidence of discrete fluid
collection or hematoma.

## 2021-03-25 IMAGING — CR DG HIP (WITH OR WITHOUT PELVIS) 2-3V*L*
3 series · 3 of 3 positions shown · non-contrast
Comparison: None.

CLINICAL DATA: Fall, left hip pain

EXAM:
DG HIP (WITH OR WITHOUT PELVIS) 2-3V LEFT

[hip ap]
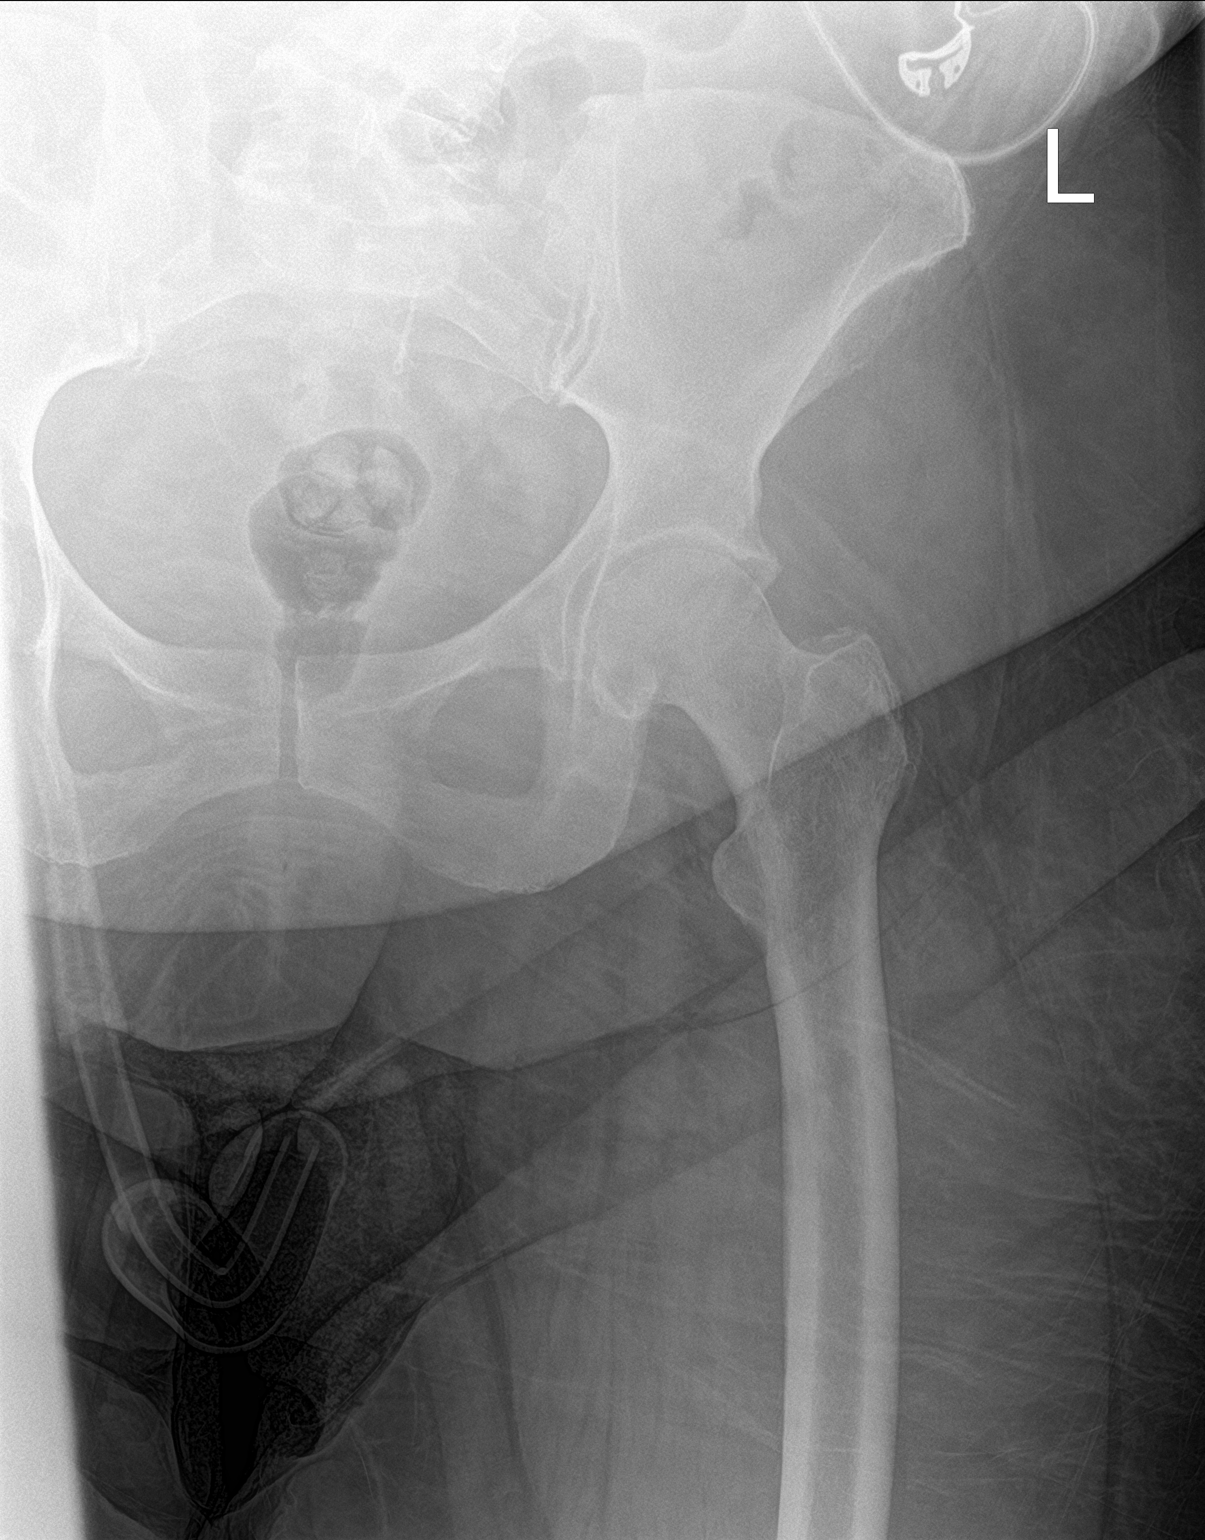

[hip lat]
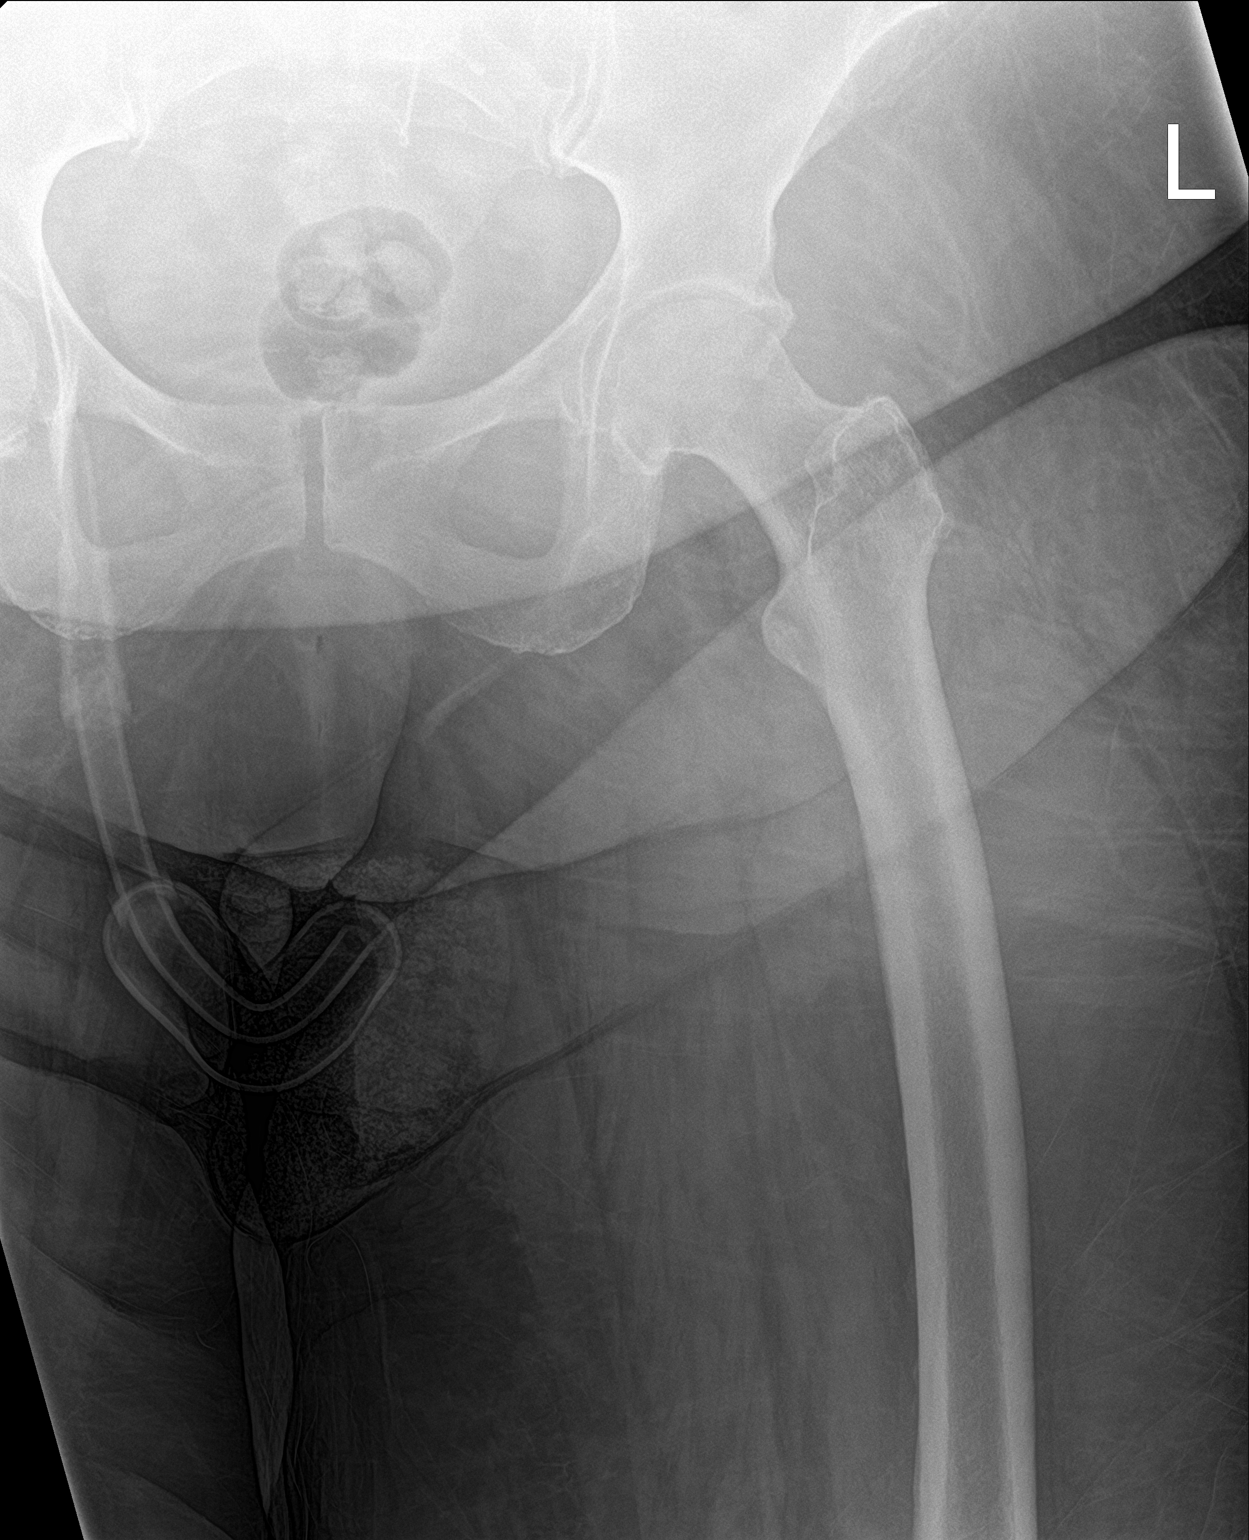

[pelvis ap]
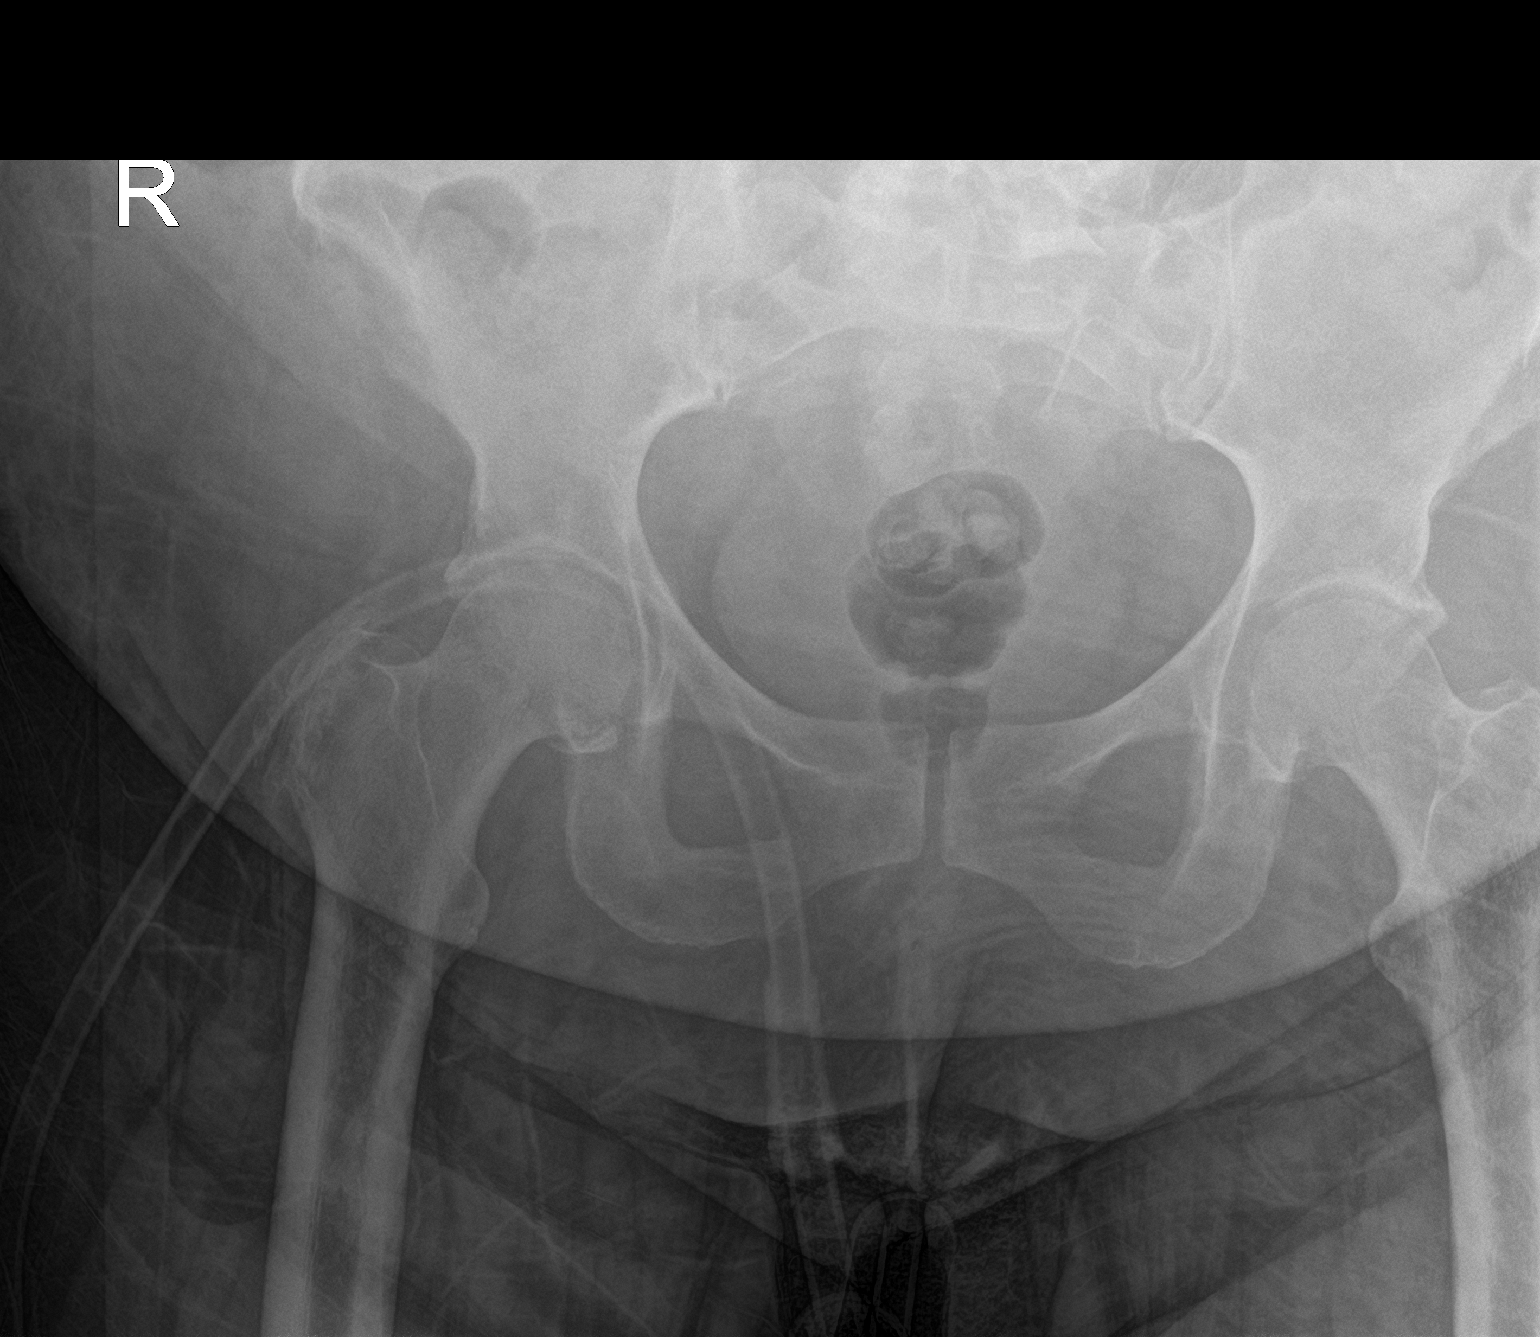

[3 of 3 positions shown; findings below may reference images not displayed]

FINDINGS: Normal alignment. No fracture or dislocation. Mild bilateral
degenerative hip arthritis. Soft tissues are unremarkable.
IMPRESSION: No acute fracture or dislocation.

## 2021-03-25 MED ORDER — HYDROCODONE-ACETAMINOPHEN 5-325 MG PO TABS
1.0000 | ORAL_TABLET | Freq: Four times a day (QID) | ORAL | Status: DC | PRN
  Administered 2021-03-25 (×2): 1 via ORAL
  Filled 2021-03-25 (×2): qty 1

## 2021-03-25 MED ORDER — HYDROCODONE-ACETAMINOPHEN 5-325 MG PO TABS
1.0000 | ORAL_TABLET | Freq: Four times a day (QID) | ORAL | Status: AC | PRN
Start: 2021-03-25 — End: 2021-03-25
  Administered 2021-03-25: 1 via ORAL
  Filled 2021-03-25: qty 1

## 2021-03-25 MED ORDER — LACTATED RINGERS IV SOLN: INTRAVENOUS | Status: DC

## 2021-03-25 MED ORDER — TRAMADOL HCL 50 MG PO TABS
50.0000 mg | ORAL_TABLET | Freq: Four times a day (QID) | ORAL | Status: DC | PRN
  Administered 2021-03-25 – 2021-03-26 (×3): 50 mg via ORAL
  Filled 2021-03-25 (×3): qty 1

## 2021-03-25 NOTE — Progress Notes (Signed)
PROGRESS NOTE    [name]  OZD:[id number] DOB: 1956/08/05 DOA: 03/22/2021 PCP: [name], MD   Brief Narrative:  The patient is a morbidly obese 65 year old Caucasian female with a past medical history significant for but not limited to aortic stenosis status post TAVR currently on anticoagulation with warfarin, chronic diastolic CHF, hypertension, history of TIA, history of obstructive sleep apnea on BiPAP as well as other comorbidities who presented to the hospital with presyncopal episodes.  She reports that for the last 2 to 3 days she has had falls 3 different times.  She states the falls happen when she is walking across the room and states that her "head feels funny" and states that her immediately feels like her legs give out from underneath her.  She tried using her Rollator yesterday to support her but had a third fall with similar symptoms.  She denies any room spinning sensation.  No loss of consciousness.  She did not hit her head.  She does take torsemide and states last week she had urinary symptoms that she lost about 6 pounds.  She was also noted to have a supratherapeutic INR of 7.8 at her PCP office on 03/13/2021 and she was asked to hold her Coumadin for several days and had repeat on 7/12 which was still elevated at 7.8.  For last few days she has noticed some nosebleeds but denies any blood per rectum or any dark stools.  Approximately 3 weeks ago she was started on bupropion for depression.  In the ED she was noted to be hypotensive down to the 90s over 60s and had an elevated WBC of 20.3.  Creatinine was elevated from her baseline of 0.5 and she was admitted for an AKI.  Given her fall she had a head CT and was negative.  She was admitted for presyncope and weakness in the setting of hypotension and dehydration likely suspected to overdiuresis with torsemide as well as for AKI.   AKI is improving with IVF Hydration and PT/OT recommending Home Health. Patient was  Orthostatic yesterday so she was given her another 1 Liter of IVF and TED hose and Maintenance IVF renewed but at 50 mL/hr. Repeat Orthostatic VS pending to be done. INR trending down and is now 3.7. She had an X-Ray of her Thigh and Hip yesterday due to Pain and it showed "Normal alignment. No fracture or dislocation. Mild left hip degenerative arthritis. Soft tissues are unremarkable." Because she continues to have a bruise and a knot on the Left thigh will obtain a Soft Tissue U/S to assess for Hematoma.   Assessment & Plan:   Principal Problem:   Hypotension Active Problems:   H/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   Sleep apnea   Chronic diastolic CHF (congestive heart failure) (HCC)   Weakness   Depression   AKI (acute kidney injury) ([place])   Supratherapeutic INR  Presyncope/weakness secondary to hypotension in the setting Of Orthostatic Hypotension -Suspect that patient is over diuresed with her torsemide causing her to be volume depleted and hypotensive -Has received 1.5 L normal saline fluid in the ED. Give an additional 1 Liter bolus yesterday; Continue Maintenance IVF at 75 mL/hr but timed out and renewed at 50 mL/hr of LR overnight. -Added TED Hose -Hold antihypertensives -Check orthostatic vital signs; she still remains on the softer side with her blood pressure so we will continue to hold her antihypertensives. She was Positive for Orthostatic Hypotension as her BP dropped  from 113/79 Lying -> 105/72 Sitting -> 78/58 Standing yesterday and repeat Orthostatic VS pending today -Troponin was mildly elevated but flat at 53 and then repeat was 52 -Obtain PT and OT to further evaluate and treat and recommending Home Health when Stable for D/C    AKI, improving  -Suspect due to overdiuresis with torsemide -Baseline creatinine is around 0.8; she presented with a BUNs/creatinine 35/2.01 and trending down 34/1.73 -> 28/1.43 -> 14/0.95 -Avoid nephrotoxic medications, contrast  dyes, hypotension and renally adjust medications -Follow-up with repeat labs after IV fluid resuscitation; now on LR at 50 mL/hr -Continue monitor and trend renal function carefully repeat CMP in a.m.  Hypokalemia -Patient's potassium was 2.9 and improved to 3.7 -Checked Mag Level and was 2.0 -Continue monitor and replete as necessary -Repeat CMP in a.m.   Supratherapeutic INR w/hx of aortic stenosis status post AVR  -INR 6.4 on admission and repeat was 6.5 yesterday; Now it is 3.7 -Suspect could be due to her AKI.  Only new medication was bupropion 2 weeks ago which is unlikely to cause. -Has had 2 days of nosebleed but no other significant findings of bleeding.  Hemoglobin is stable.  We will hold Coumadin without reversal at this time but if she becomes symptomatic or actively bleeding we will give her vitamin K and possibly FFP -Check INR daily   Chronic Diastolic heart failure -Continue to Monitor closely while receiving fluids -Currently appears dry and family thinks that her legs are looking the best they have usually been and this is usually what they are normally -Strict I's and O's and daily weights; Patient is +1.963 Liters  -Continue to monitor for signs and symptoms of volume overload  Erythrocytosis -> Normocytic Anemia  -Likely was hemoconcentrated on admission from dehydration and likley this is a dilutional drop but there is a concern for a Left Leg Hematoma due to the bruising and knot so will order a Soft Tissue U/S; Soft Tissue U/S Showed "Focused ultrasound of the area of concern along the left lateral thigh demonstrates soft tissue swelling without discrete fluid collection or hematoma. No soft tissue mass." -Patient hemoglobin/hematocrit went from 17.1/53.4 -> 14.0/42.5 -> 12.0/37.4 -> 9.5/29.9;  -Check Anemia Panel in the AM; Consider obtaining FOBT -Continue to Monitor for S/Sx of Bleeding; No overt bleeding noted  -Continue monitor and trend and repeat CBC  Left  Thigh Pain and Bruise -Concern for Hematoma Ruled out as U/S showed "Focused ultrasound of the area of concern along the left lateral thigh demonstrates soft tissue swelling without discrete fluid collection or hematoma. No soft tissue mass." -Had an X-Ray of the Left Hip and Femur that was Unremarkable -Pain Control and K Pad   OSA -Continue BiPAP   Depression -Continue Bupropion and Cymbalta   Hyperlipidemia -Continue with Pravastatin 40 mg p.o. daily   Leukocytosis, improved -Unclear etiology but likely in the setting of hemoconcentration from dehydration from overdiuresis -Patient WBC has gone from 20.3 -> 22.7 -> 11.1 -> 9.1 -If persistently remains elevated we will do an infectious work-up and obtain a chest x-ray, urinalysis with urine culture as well as blood cultures -Continue to monitor off of Antibiotics and trend and repeat CBC  Morbid Obesity -Complicates overall prognosis and care -Estimated body mass index is 48.78 kg/m as calculated from the following:   Height as of this encounter: 5\' 1"  (1.549 m).   Weight as of this encounter: 117.1 kg. -Weight Loss and Dietary Counseling given   DVT prophylaxis: Holding  Coumadin given Supratherapeutic INR at 3.7 Code Status: FULL CODE Family Communication: No Family present at bedside  Disposition Plan: Pending further clinical improvement and evaluation by PT/OT   Status is: Inpatient  Remains inpatient appropriate because:Unsafe d/c plan, IV treatments appropriate due to intensity of illness or inability to take PO, and Inpatient level of care appropriate due to severity of illness  Dispo: The patient is from: Home              Anticipated d/c is to: Home              Patient currently is not medically stable to d/c.   Difficult to place patient No  Consultants:  None  Procedures: None  Antimicrobials:  Anti-infectives (From admission, onward)    None        Subjective: Seen and examined at bedside and  felt okay to still complaining of some left thigh pain.  No nausea or vomiting.  Felt dizzy yesterday was standing up.  States that the left thigh pain is what is bothering her the most and had to get Norco yesterday.  Overall she feels a little bit better.  No other concerns or complaints at this time.  Objective: Vitals:   03/24/21 2000 03/24/21 2300 03/25/21 0355 03/25/21 0800  BP: 129/66 (!) 152/62 (!) 128/59 134/62  Pulse: 91 94 85 89  Resp: 18 17 14 15   Temp:  98 F (36.7 C) 97.7 F (36.5 C) 98 F (36.7 C)  TempSrc:  Oral Axillary Oral  SpO2: 95% 93% 92% 91%  Weight:   117.1 kg   Height:        Intake/Output Summary (Last 24 hours) at 03/25/2021 0857 Last data filed at 03/25/2021 0800 Gross per 24 hour  Intake 567.58 ml  Output 300 ml  Net 267.58 ml    Filed Weights   03/23/21 0129 03/24/21 0300 03/25/21 0355  Weight: 110.5 kg 113 kg 117.1 kg   Examination: Physical Exam:  Constitutional: WN/WD morbidly obese Caucasian female in NAD and appears calm and comfortable Eyes: Lids and conjunctivae normal, sclerae anicteric  ENMT: External Ears, Nose appear normal. Grossly normal hearing.  Neck: Appears normal, supple, no cervical masses, normal ROM, no appreciable thyromegaly; no appreciable JVD Respiratory: Slightly Diminished to auscultation bilaterally, no wheezing, rales, rhonchi or crackles. Normal respiratory effort and patient is not tachypenic. No accessory muscle use. Unlabored breathing  Cardiovascular: RRR, no murmurs / rubs / gallops. S1 and S2 auscultated. Has chronic edema in the setting of some slight lymphedema Abdomen: Soft, non-tender, Distended 2/2 body habitus. Bowel sounds positive.  GU: Deferred. Musculoskeletal: No clubbing / cyanosis of digits/nails. No joint deformity upper and lower extremities.  Skin: No rashes, lesions, ulcers but has a bruise on the left lateral Thigh with mild induration. Warm and dry.  Neurologic: CN 2-12 grossly intact with no  focal deficits. Romberg sign and cerebellar reflexes not assessed.  Psychiatric: Normal judgment and insight. Alert and oriented x 3. Normal mood and appropriate affect.    Data Reviewed: I have personally reviewed following labs and imaging studies  CBC: Recent Labs  Lab 03/22/21 1932 03/23/21 0547 03/24/21 0015 03/25/21 0014  WBC 20.3* 22.7* 11.1* 9.1  NEUTROABS 16.9*  --  8.5* 5.7  HGB 17.1* 14.0 12.0 9.5*  HCT 53.4* 42.5 37.4 29.9*  MCV 87.0 84.8 86.2 87.2  PLT 257 225 175 458    Basic Metabolic Panel: Recent Labs  Lab 03/22/21 1932 03/23/21 0547 03/24/21  0015 03/25/21 0014  NA 134* 135 134* 136  K 3.2* 2.9* 3.2* 3.7  CL 96* 100 98 104  CO2 24 25 29 27   GLUCOSE 146* 112* 117* 106*  BUN 35* 34* 28* 14  CREATININE 2.01* 1.73* 1.43* 0.95  CALCIUM 9.7 9.2 9.2 9.0  MG  --   --  2.0 2.0  PHOS  --   --  2.6 2.6    GFR: Estimated Creatinine Clearance: 69.4 mL/min (by C-G formula based on SCr of 0.95 mg/dL). Liver Function Tests: Recent Labs  Lab 03/22/21 1932 03/24/21 0015 03/25/21 0014  AST 25 25 27   ALT 17 15 15   ALKPHOS 64 49 41  BILITOT 1.0 0.4 0.7  PROT 7.1 5.9* 5.7*  ALBUMIN 3.5 2.9* 2.7*    Recent Labs  Lab 03/22/21 1932  LIPASE 24    No results for input(s): AMMONIA in the last 168 hours. Coagulation Profile: Recent Labs  Lab 03/20/21 1543 03/22/21 1932 03/23/21 0547 03/24/21 0749 03/25/21 0014  INR 7.8* 6.4* 6.5* 4.2* 3.7*    Cardiac Enzymes: No results for input(s): CKTOTAL, CKMB, CKMBINDEX, TROPONINI in the last 168 hours. BNP (last 3 results) Recent Labs    06/06/20 0903  PROBNP 1,079*    HbA1C: No results for input(s): HGBA1C in the last 72 hours. CBG: Recent Labs  Lab 03/22/21 1931 03/23/21 0619 03/24/21 0612 03/25/21 0620  GLUCAP 173* 120* 108* 103*    Lipid Profile: No results for input(s): CHOL, HDL, LDLCALC, TRIG, CHOLHDL, LDLDIRECT in the last 72 hours. Thyroid Function Tests: Recent Labs     03/23/21 0547  TSH 1.908    Anemia Panel: No results for input(s): VITAMINB12, FOLATE, FERRITIN, TIBC, IRON, RETICCTPCT in the last 72 hours. Sepsis Labs: No results for input(s): PROCALCITON, LATICACIDVEN in the last 168 hours.  Recent Results (from the past 240 hour(s))  Urine Culture     Status: Abnormal   Collection Time: 03/22/21  6:56 PM   Specimen: Urine, Clean Catch  Result Value Ref Range Status   Specimen Description URINE, CLEAN CATCH  Final   Special Requests NONE  Final   Culture (A)  Final    10,000 COLONIES/mL LACTOBACILLUS SPECIES Standardized susceptibility testing for this organism is not available. Performed at [facility], [place] [address] [place], [place] [address]    Report Status 03/24/2021 FINAL  Final  SARS CORONAVIRUS 2 (TAT 6-24 HRS) Nasopharyngeal Nasopharyngeal Swab     Status: None   Collection Time: 03/22/21  9:55 PM   Specimen: Nasopharyngeal Swab  Result Value Ref Range Status   SARS Coronavirus 2 NEGATIVE NEGATIVE Final    Comment: (NOTE) SARS-CoV-2 target nucleic acids are NOT DETECTED.  The SARS-CoV-2 RNA is generally detectable in upper and lower respiratory specimens during the acute phase of infection. Negative results do not preclude SARS-CoV-2 infection, do not rule out co-infections with other pathogens, and should not be used as the sole basis for treatment or other patient management decisions. Negative results must be combined with clinical observations, patient history, and epidemiological information. The expected result is Negative.  Fact Sheet for Patients: SugarRoll.be  Fact Sheet for Healthcare Providers: https://www.woods-mathews.com/  This test is not yet approved or cleared by the [place] FDA and  has been authorized for detection and/or diagnosis of SARS-CoV-2 by FDA under an Emergency Use Authorization (EUA). This EUA will remain  in effect (meaning this test  can be used) for the duration of the COVID-19 declaration under  Se ction 564(b)(1) of the Act, 21 U.S.C. section 360bbb-3(b)(1), unless the authorization is terminated or revoked sooner.  Performed at [facility], [place] [address], [place], [place] [address]   MRSA Next Gen by PCR, Nasal     Status: Abnormal   Collection Time: 03/23/21  1:32 AM   Specimen: Nasal Mucosa; Nasal Swab  Result Value Ref Range Status   MRSA by PCR Next Gen DETECTED (A) NOT DETECTED Final    Comment: RESULT CALLED TO, READ BACK BY AND VERIFIED WITH: A. TEPE,RN 0430 03/23/2021 T. TYSOR (NOTE) The GeneXpert MRSA Assay (FDA approved for NASAL specimens only), is one component of a comprehensive MRSA colonization surveillance program. It is not intended to diagnose MRSA infection nor to guide or monitor treatment for MRSA infections. Test performance is not FDA approved in patients less than 7 years old. Performed at [facility], [place] [address], [place], [place] [address]      RN Pressure Injury Documentation:     Estimated body mass index is 48.78 kg/m as calculated from the following:   Height as of this encounter: 5\' 1"  (1.549 m).   Weight as of this encounter: 117.1 kg.  Malnutrition Type:  Malnutrition Characteristics:  Nutrition Interventions:  Radiology Studies: DG HIP UNILAT WITH PELVIS 2-3 VIEWS LEFT  Result Date: 03/25/2021 CLINICAL DATA:  Fall, left hip pain EXAM: DG HIP (WITH OR WITHOUT PELVIS) 2-3V LEFT COMPARISON:  None. FINDINGS: Normal alignment. No fracture or dislocation. Mild bilateral degenerative hip arthritis. Soft tissues are unremarkable. IMPRESSION: No acute fracture or dislocation. Electronically Signed   By: Fidela Salisbury MD   On: 03/25/2021 03:37   DG FEMUR MIN 2 VIEWS LEFT  Result Date: 03/25/2021 CLINICAL DATA:  Fall, left leg pain EXAM: LEFT FEMUR 2 VIEWS COMPARISON:  None. FINDINGS: Normal alignment. No fracture or dislocation. Mild left hip  degenerative arthritis. Soft tissues are unremarkable. IMPRESSION: No acute fracture or dislocation. Electronically Signed   By: Fidela Salisbury MD   On: 03/25/2021 03:37    Scheduled Meds:  buPROPion  150 mg Oral Daily   diclofenac Sodium  2 g Topical QID   DULoxetine  60 mg Oral Daily   mupirocin ointment  1 application Nasal BID   oxybutynin  5 mg Oral QPM   polyethylene glycol  17 g Oral BID   pravastatin  40 mg Oral q1800   senna-docusate  1 tablet Oral BID   sodium chloride flush  3 mL Intravenous Q12H   Continuous Infusions:  lactated ringers 50 mL/hr at 03/25/21 0300     LOS: 2 days   Kerney Elbe, DO [facility] PAGER is on [place]  If 7PM-7AM, please contact night-coverage [email or web address]

## 2021-03-25 NOTE — Plan of Care (Signed)

## 2021-03-25 NOTE — Progress Notes (Signed)
Patient accidentally pulled IV out, has been without IV fluids for most of the afternoon and only able to take PO pain meds which have not controlled pain well enough to do orthostatic vitals--along with fall was probably due to dehydration per MD, so not a safe time to perform ortho vitals since fluids have not been running for awhile

## 2021-03-26 ENCOUNTER — Inpatient Hospital Stay (HOSPITAL_COMMUNITY): Payer: Medicare Other

## 2021-03-26 DIAGNOSIS — I5032 Chronic diastolic (congestive) heart failure: Secondary | ICD-10-CM | POA: Diagnosis not present

## 2021-03-26 DIAGNOSIS — N179 Acute kidney failure, unspecified: Secondary | ICD-10-CM | POA: Diagnosis not present

## 2021-03-26 DIAGNOSIS — F32A Depression, unspecified: Secondary | ICD-10-CM | POA: Diagnosis not present

## 2021-03-26 DIAGNOSIS — I9589 Other hypotension: Secondary | ICD-10-CM | POA: Diagnosis not present

## 2021-03-26 LAB — CBC WITH DIFFERENTIAL/PLATELET
Abs Immature Granulocytes: 0.05 10*3/uL (ref 0.00–0.07)
Basophils Absolute: 0 10*3/uL (ref 0.0–0.1)
Basophils Relative: 0 %
Eosinophils Absolute: 0.5 10*3/uL (ref 0.0–0.5)
Eosinophils Relative: 6 %
HCT: 25 % — ABNORMAL LOW (ref 36.0–46.0)
Hemoglobin: 7.9 g/dL — ABNORMAL LOW (ref 12.0–15.0)
Immature Granulocytes: 1 %
Lymphocytes Relative: 21 %
Lymphs Abs: 1.9 10*3/uL (ref 0.7–4.0)
MCH: 28.3 pg (ref 26.0–34.0)
MCHC: 31.6 g/dL (ref 30.0–36.0)
MCV: 89.6 fL (ref 80.0–100.0)
Monocytes Absolute: 0.8 10*3/uL (ref 0.1–1.0)
Monocytes Relative: 9 %
Neutro Abs: 5.6 10*3/uL (ref 1.7–7.7)
Neutrophils Relative %: 63 %
Platelets: 178 10*3/uL (ref 150–400)
RBC: 2.79 MIL/uL — ABNORMAL LOW (ref 3.87–5.11)
RDW: 14.6 % (ref 11.5–15.5)
WBC: 9 10*3/uL (ref 4.0–10.5)
nRBC: 0 % (ref 0.0–0.2)

## 2021-03-26 LAB — PROTIME-INR
INR: 2.5 — ABNORMAL HIGH (ref 0.8–1.2)
Prothrombin Time: 27.2 seconds — ABNORMAL HIGH (ref 11.4–15.2)

## 2021-03-26 LAB — COMPREHENSIVE METABOLIC PANEL
ALT: 17 U/L (ref 0–44)
AST: 28 U/L (ref 15–41)
Albumin: 2.5 g/dL — ABNORMAL LOW (ref 3.5–5.0)
Alkaline Phosphatase: 39 U/L (ref 38–126)
Anion gap: 7 (ref 5–15)
BUN: 11 mg/dL (ref 8–23)
CO2: 26 mmol/L (ref 22–32)
Calcium: 8.9 mg/dL (ref 8.9–10.3)
Chloride: 103 mmol/L (ref 98–111)
Creatinine, Ser: 1.02 mg/dL — ABNORMAL HIGH (ref 0.44–1.00)
GFR, Estimated: >60 mL/min (ref >60)
Glucose, Bld: 116 mg/dL — ABNORMAL HIGH (ref 70–99)
Potassium: 3.8 mmol/L (ref 3.5–5.1)
Sodium: 136 mmol/L (ref 135–145)
Total Bilirubin: 0.8 mg/dL (ref 0.3–1.2)
Total Protein: 5.3 g/dL — ABNORMAL LOW (ref 6.5–8.1)

## 2021-03-26 LAB — FOLATE: Folate: 5.7 ng/mL — ABNORMAL LOW (ref >5.9)

## 2021-03-26 LAB — GLUCOSE, CAPILLARY: Glucose-Capillary: 96 mg/dL (ref 70–99)

## 2021-03-26 LAB — VITAMIN B12: Vitamin B-12: 263 pg/mL (ref 180–914)

## 2021-03-26 LAB — IRON AND TIBC
Iron: 30 ug/dL (ref 28–170)
Saturation Ratios: 13 % (ref 10.4–31.8)
TIBC: 227 ug/dL — ABNORMAL LOW (ref 250–450)
UIBC: 197 ug/dL

## 2021-03-26 LAB — RETICULOCYTES
Immature Retic Fract: 24.8 % — ABNORMAL HIGH (ref 2.3–15.9)
RBC.: 2.69 MIL/uL — ABNORMAL LOW (ref 3.87–5.11)
Retic Count, Absolute: 76.4 10*3/uL (ref 19.0–186.0)
Retic Ct Pct: 2.8 % (ref 0.4–3.1)

## 2021-03-26 LAB — MAGNESIUM: Magnesium: 2 mg/dL (ref 1.7–2.4)

## 2021-03-26 LAB — FERRITIN: Ferritin: 95 ng/mL (ref 11–307)

## 2021-03-26 LAB — PHOSPHORUS: Phosphorus: 2.8 mg/dL (ref 2.5–4.6)

## 2021-03-26 IMAGING — CT CT KNEE*L* W/O CM
3 of 4 series · 15 of 36 positions shown, 17 images · non-contrast
Comparison: Plain films left knee [DATE]. Ultrasound soft
tissues about the left knee [DATE].

CLINICAL DATA: Anemic patient with a soft tissue mass of the left
knee. History of a recent fall.

EXAM:
CT OF THE LEFT KNEE WITHOUT CONTRAST
TECHNIQUE: Multidetector CT imaging of the left knee was performed according to
the standard protocol. Multiplanar CT image reconstructions were
also generated.

[Series 4: lower ext 1.5 st · axial · 0.53mm/px · z∈[+384,+589]mm · 8 of 163 slices shown, 10 images]
[im 13/163  soft-tissue]
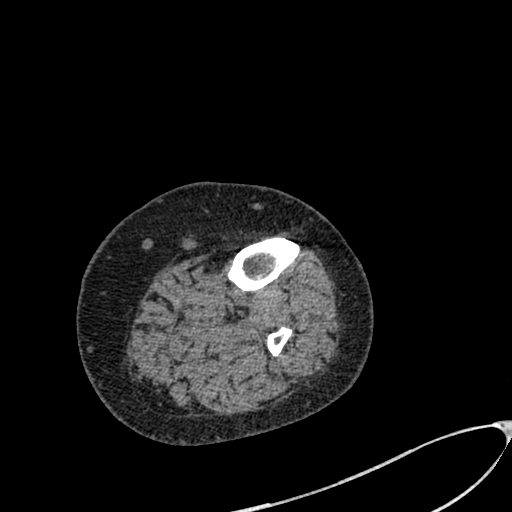
[im 13/163  bone]
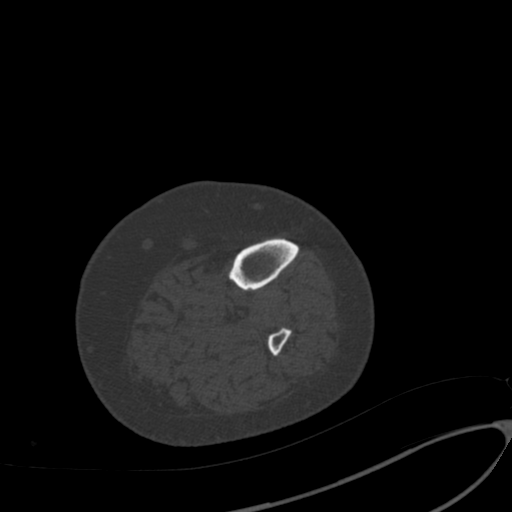
[im 38/163  bone]
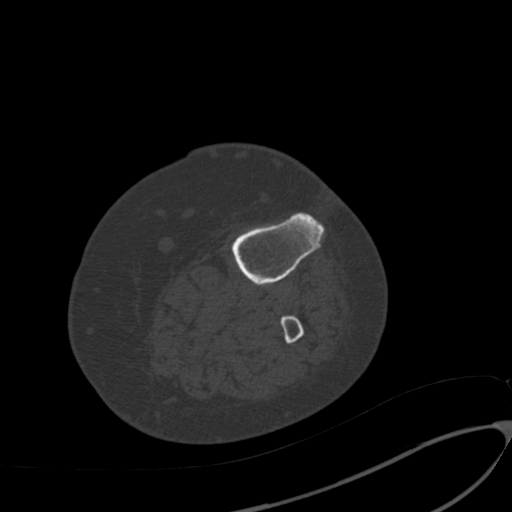
[im 50/163  bone]
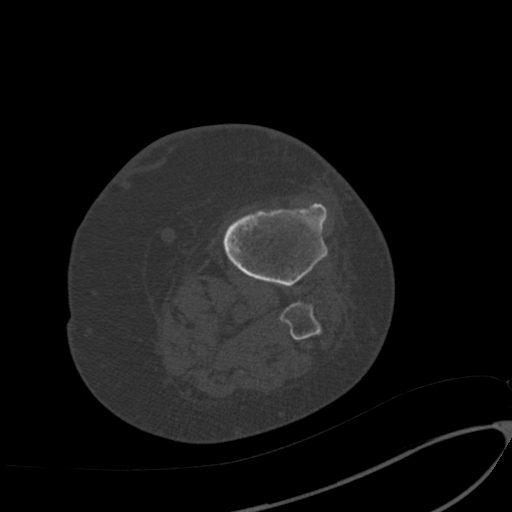
[im 75/163  bone]
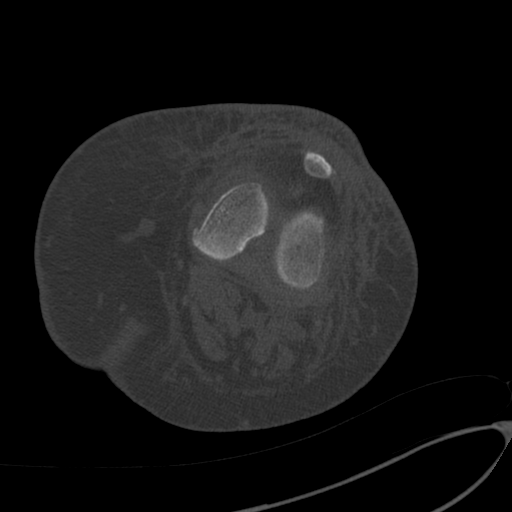
[im 88/163  soft-tissue]
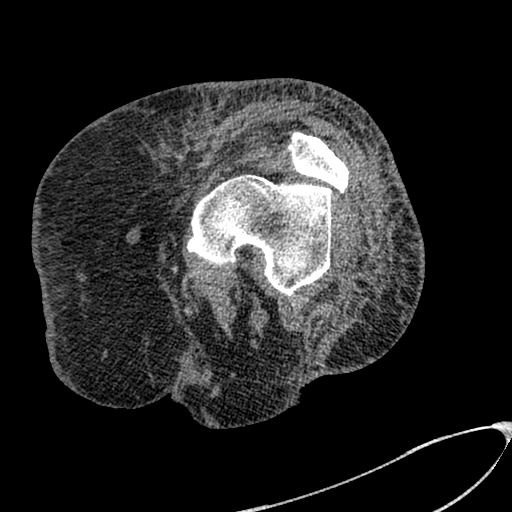
[im 88/163  bone]
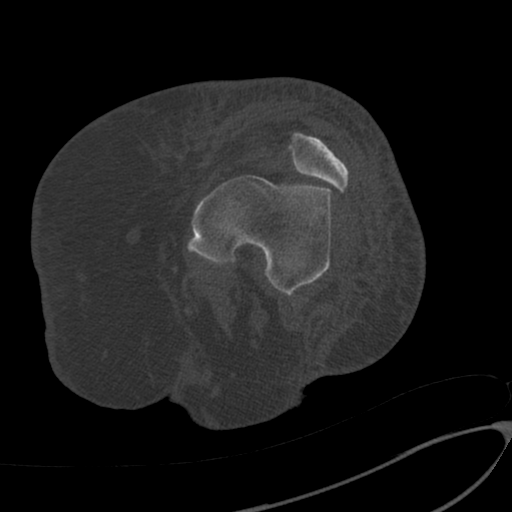
[im 113/163  bone]
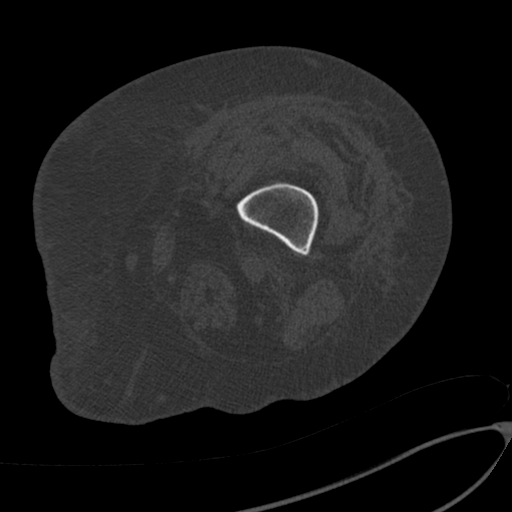
[im 125/163  bone]
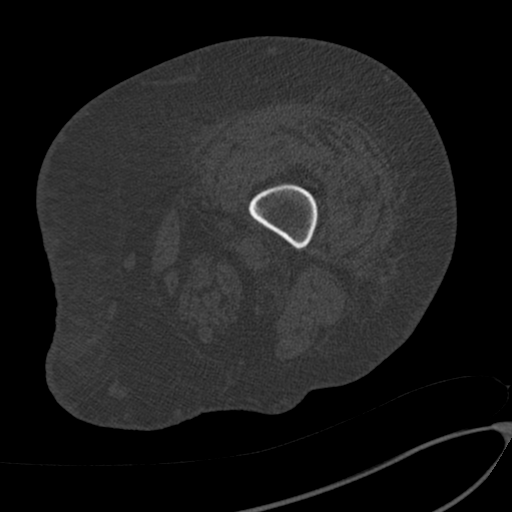
[im 150/163  bone]
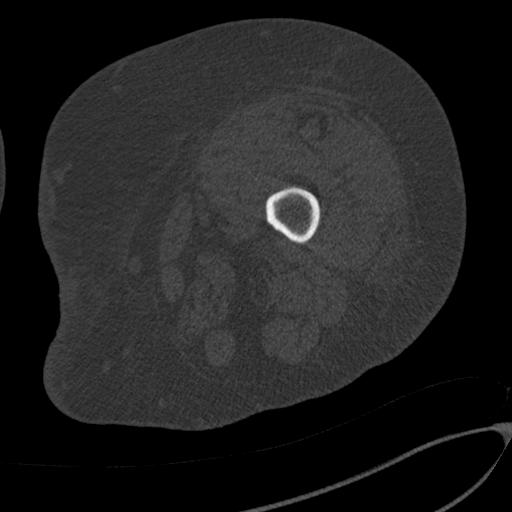

[Series 7: lower ext cor bone · coronal · 0.37mm/px · 1 of 151 slices shown]
[im 76/151  bone]
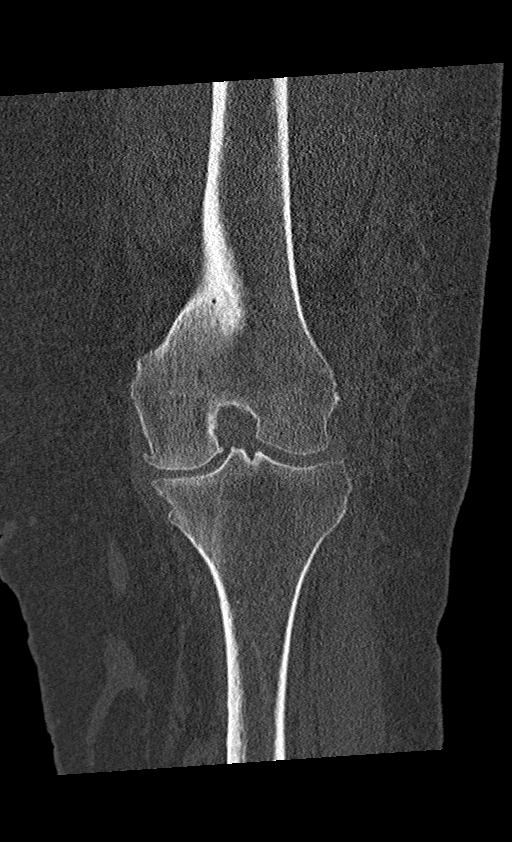

[Series 10: lower ext sag st · sagittal · 0.50mm/px · 6 of 157 slices shown]
[im 53/157  bone]
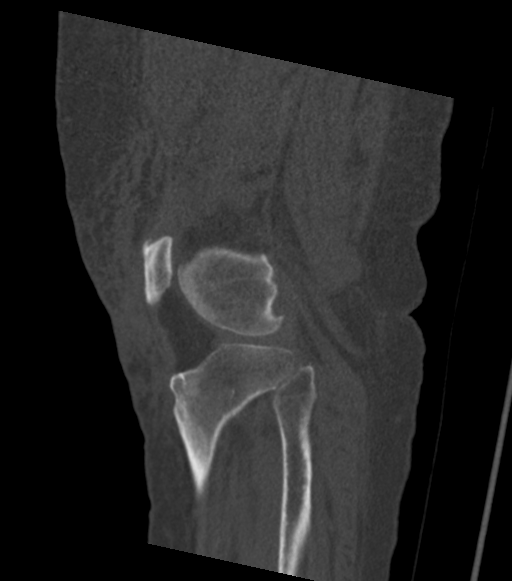
[im 66/157  bone]
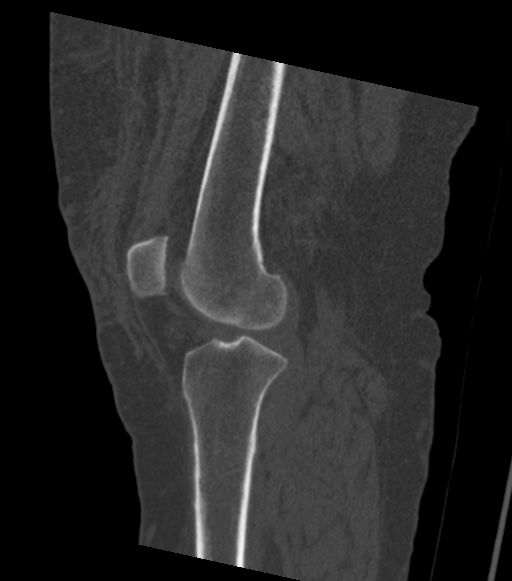
[im 74/157  soft-tissue]
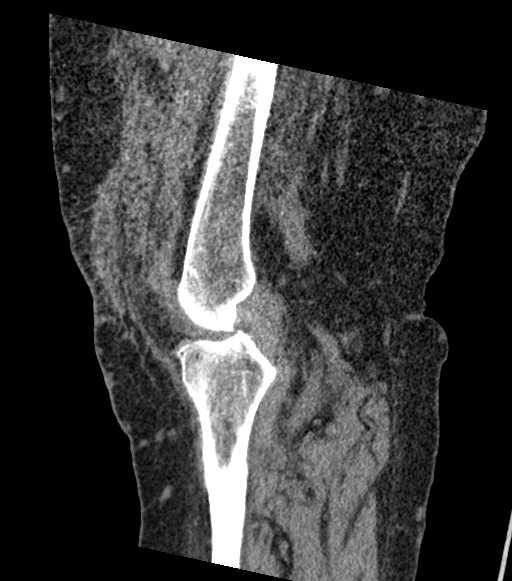
[im 79/157  bone]
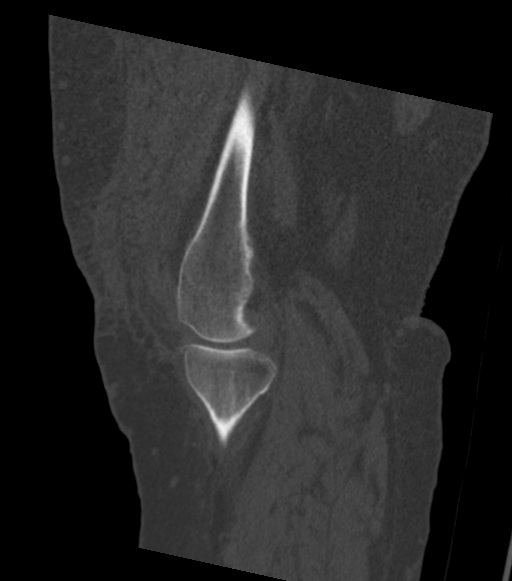
[im 92/157  bone]
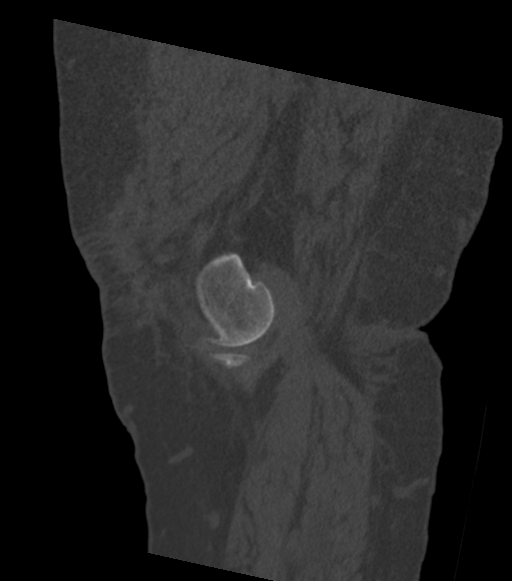
[im 105/157  bone]
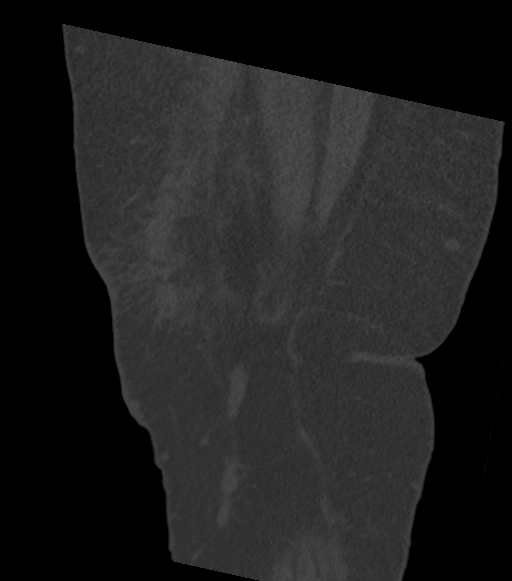

[15 of 36 positions shown; findings below may reference images not displayed]

FINDINGS: Bones/Joint/Cartilage

There is no acute or focal bony abnormality. No lytic or sclerotic
lesion. Small osteophytes are present about the knee. Joint space
narrowing is most notable medially.

Ligaments

Suboptimally assessed by CT.

Muscles and Tendons

Intact.  No intramuscular fluid collection or mass.

Soft tissues

No fluid collection or mass. There is some subcutaneous edema about
the knee, most notable anteriorly. No radiopaque foreign body or
soft tissue gas.
IMPRESSION: Negative for mass or fluid collection.

Infiltration of subcutaneous fat about the anterior aspect of the
knee could be due to soft tissue contusion given the patient's
history of fall. Dependent change or cellulitis are also possible.

Negative for acute bony or joint abnormality.

Osteoarthritis about the knee most notable in the medial
compartment.

## 2021-03-26 IMAGING — CT CT ABD-PELV W/O CM
2 of 4 series · 14 of 46 positions shown, 16 images · non-contrast
Comparison: None.

CLINICAL DATA: Anemia.  Soft tissue mass knee.

EXAM:
CT ABDOMEN AND PELVIS WITHOUT CONTRAST
TECHNIQUE: Multidetector CT imaging of the abdomen and pelvis was performed
following the standard protocol without IV contrast.

[Series 1: a/p w/o 5mm · axial · non-contrast · 0.98mm/px · z∈[+847,+1197]mm · 11 of 85 slices shown, 13 images]
[im 8/85  soft-tissue]
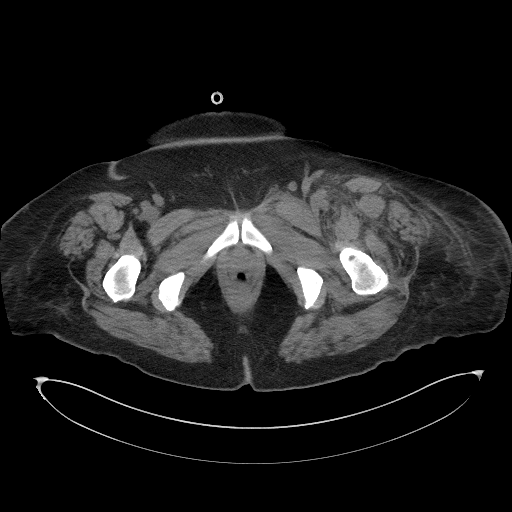
[im 8/85  bone]
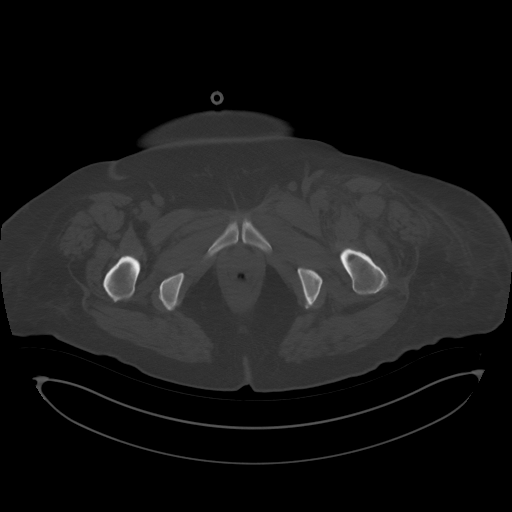
[im 15/85  soft-tissue]
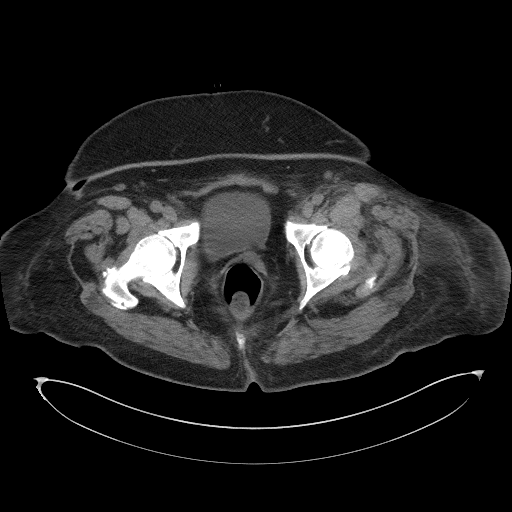
[im 22/85  soft-tissue]
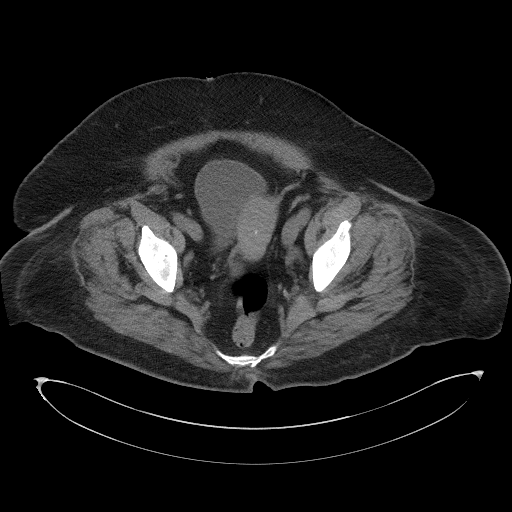
[im 29/85  soft-tissue]
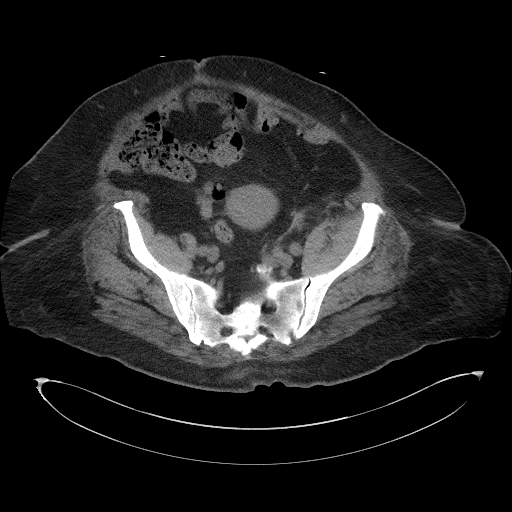
[im 36/85  soft-tissue]
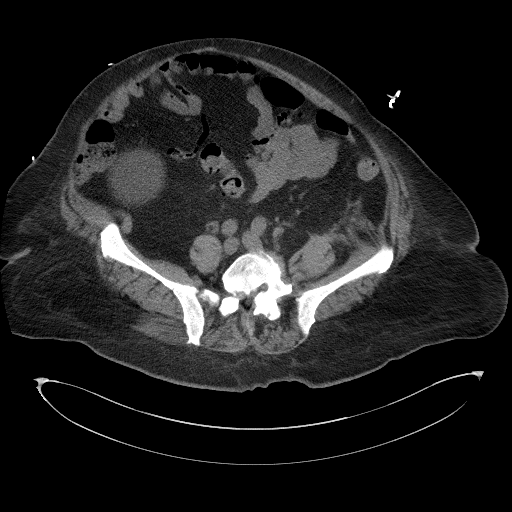
[im 43/85  soft-tissue]
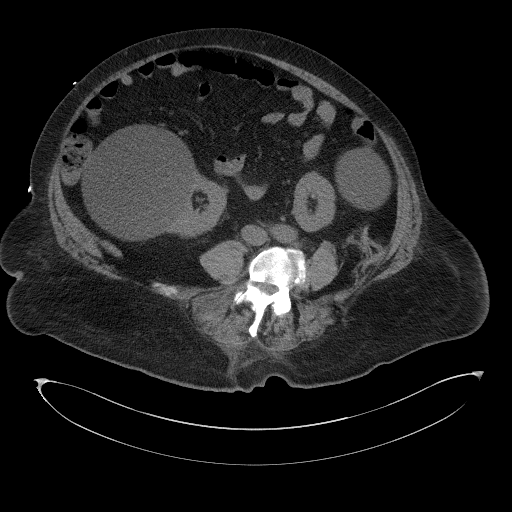
[im 50/85  soft-tissue]
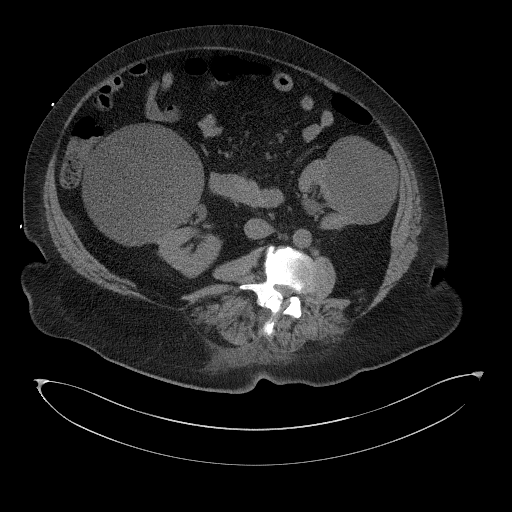
[im 57/85  soft-tissue]
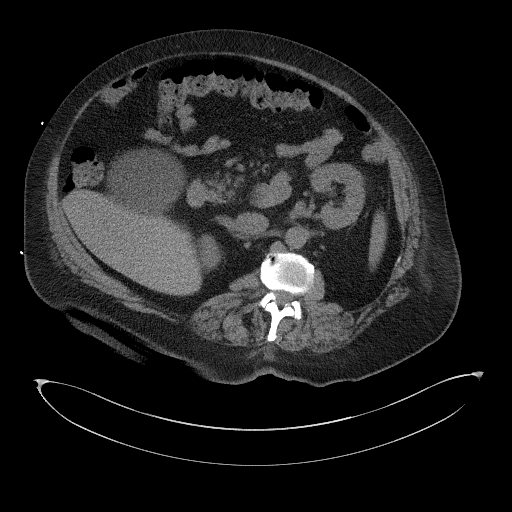
[im 64/85  soft-tissue]
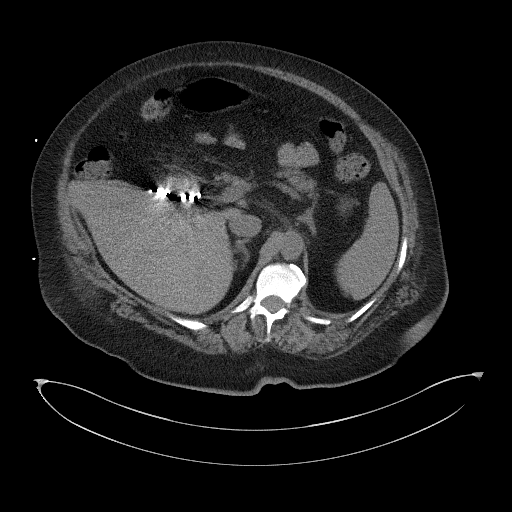
[im 64/85  bone]
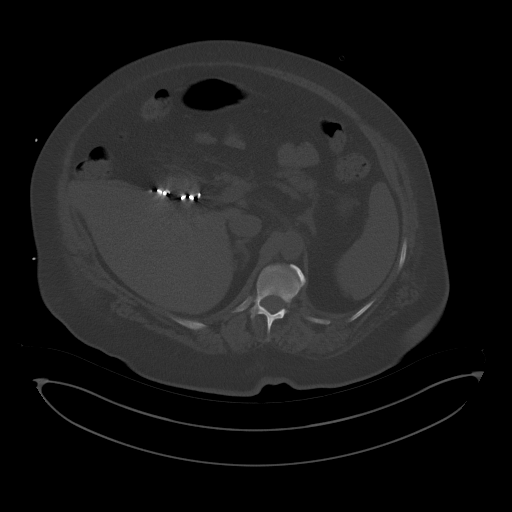
[im 71/85  soft-tissue]
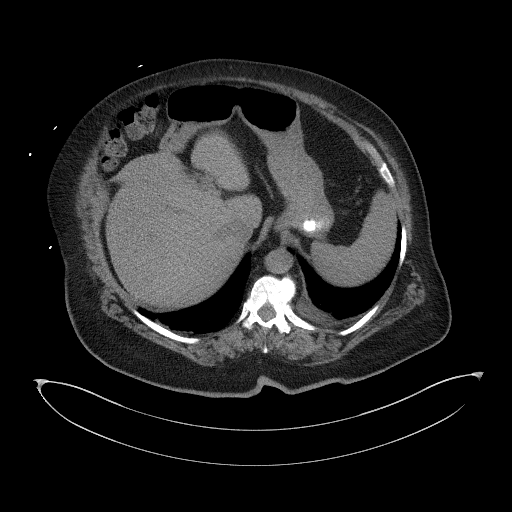
[im 78/85  soft-tissue]
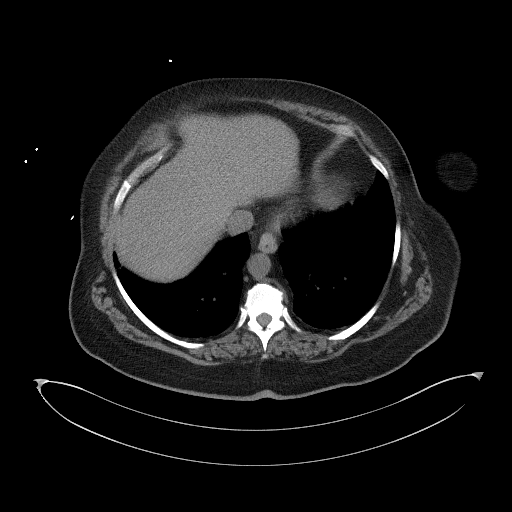

[Series 6: a/p w/o cor · coronal · non-contrast · 0.82mm/px · 3 of 199 slices shown]
[im 67/199  soft-tissue]
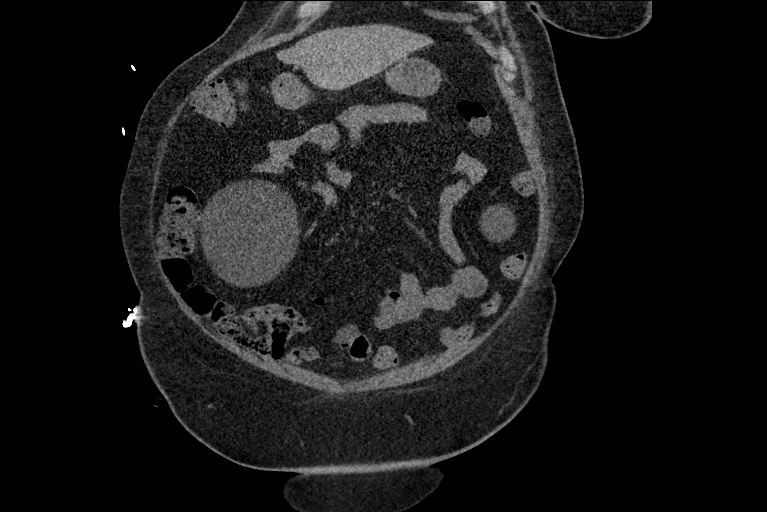
[im 89/199  soft-tissue]
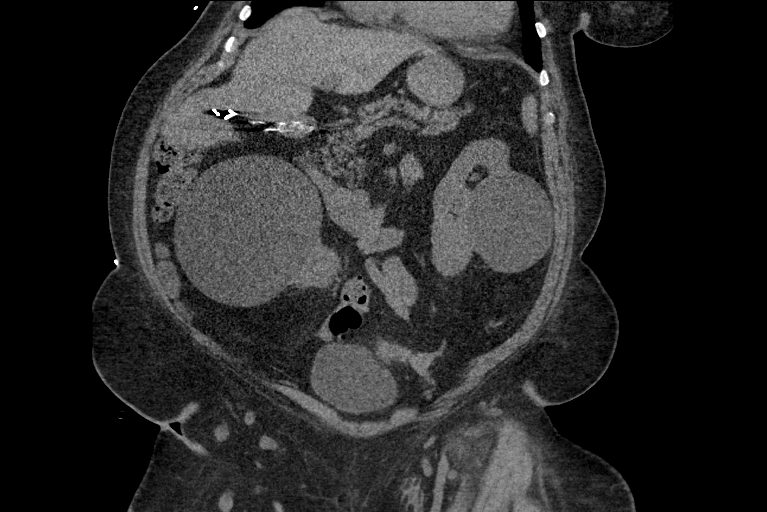
[im 111/199  soft-tissue]
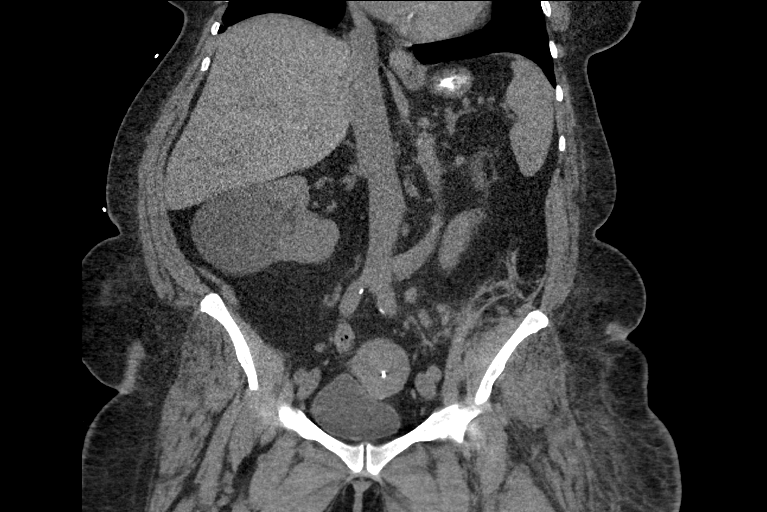

[14 of 46 positions shown; findings below may reference images not displayed]

FINDINGS: Lower chest: Trace left pleural effusion.

Hepatobiliary: No focal liver abnormality. Status post
cholecystectomy. No biliary dilatation.

Pancreas: No focal lesion. Normal pancreatic contour. No surrounding
inflammatory changes. No main pancreatic ductal dilatation.

Spleen: Normal in size without focal abnormality.

Adrenals/Urinary Tract:

No adrenal nodule bilaterally.

No nephrolithiasis, no hydronephrosis. Bilateral fluid density
lesions measuring up to 12.5 cm on the right and 9 cm on the left
likely representing simple renal cysts. No ureterolithiasis or
hydroureter.

The urinary bladder is unremarkable.

Stomach/Bowel: High density material within the gastric lumen likely
ingested medication/food. Stomach is within normal limits. No
evidence of bowel wall thickening or dilatation. Scattered colonic
diverticulosis. Appendix appears normal.

Vascular/Lymphatic: No abdominal aorta or iliac aneurysm.Mild
atherosclerotic plaque. Asymmetrically prominent but nonenlarged
left inguinal lymph nodes. Enlarged left pelvic sidewall 1.4 cm
lymph node ([DATE]). Borderline enlarged left retroperitoneal lymph
nodes (49, [DATE]).

Reproductive: T shaped intrauterine device noted within the uterus
in grossly appropriate position. Otherwise uterus and bilateral
adnexa are unremarkable.

Other: Trace left retroperitoneal free fluid and fat stranding
([DATE]). No intraperitoneal free fluid. No intraperitoneal free
gas. No organized fluid collection.

Musculoskeletal:

Asymmetrically enlarged left proximal thigh musculature compared to
the right. Left proximal thigh superficial and deep subcutaneus soft
tissue edema.

No suspicious lytic or blastic osseous lesions. No acute displaced
fracture. Levoscoliosis of the lumbar spine with associated
multilevel degenerative changes of the spine.
IMPRESSION: 1. Suggestion of a trace volume left retroperitoneal hemorrhage.
2. Left proximal thigh superficial and deep subcutaneus soft tissue
edema with asymmetrically enlarged left proximal thigh musculature
compared to the right. Underlying intramuscular hematoma not
excluded. No definite organized fluid collection with limited
evaluation on this noncontrast study.
3. Left pelvic sidewall lymphadenopathy.
4. Trace left pleural effusion.
5. Large bilateral simple renal cysts measuring up to 12.5 cm on the
right.
6.  Aortic Atherosclerosis ([SQ]-[SQ]).
7. Please note limited evaluation on this noncontrast study.

These results will be called to the ordering clinician or
representative by the Radiologist Assistant, and communication
documented in the PACS or [REDACTED].

## 2021-03-26 MED ORDER — FOLIC ACID 1 MG PO TABS
1.0000 mg | ORAL_TABLET | Freq: Every day | ORAL | Status: DC
  Administered 2021-03-26 – 2021-04-02 (×8): 1 mg via ORAL
  Filled 2021-03-26 (×8): qty 1

## 2021-03-26 MED ORDER — SODIUM CHLORIDE 0.9 % IV BOLUS
500.0000 mL | Freq: Once | INTRAVENOUS | Status: AC
  Administered 2021-03-26: 500 mL via INTRAVENOUS

## 2021-03-26 MED ORDER — VITAMIN B-12 1000 MCG PO TABS
1000.0000 ug | ORAL_TABLET | Freq: Every day | ORAL | Status: DC
  Administered 2021-03-26 – 2021-04-02 (×8): 1000 ug via ORAL
  Filled 2021-03-26 (×8): qty 1

## 2021-03-26 MED ORDER — HYDROMORPHONE HCL 1 MG/ML IJ SOLN
1.0000 mg | INTRAMUSCULAR | Status: DC | PRN
  Administered 2021-03-26: 1 mg via INTRAVENOUS
  Filled 2021-03-26: qty 1

## 2021-03-26 NOTE — Plan of Care (Signed)

## 2021-03-26 NOTE — Progress Notes (Signed)
PROGRESS NOTE    [name]  SFK:[id number] DOB: 1955-12-22 DOA: 03/22/2021 PCP: [name], MD   Brief Narrative:  The patient is a morbidly obese 65 year old Caucasian female with a past medical history significant for but not limited to aortic stenosis status post TAVR currently on anticoagulation with warfarin, chronic diastolic CHF, hypertension, history of TIA, history of obstructive sleep apnea on BiPAP as well as other comorbidities who presented to the hospital with presyncopal episodes.  She reports that for the last 2 to 3 days she has had falls 3 different times.  She states the falls happen when she is walking across the room and states that her "head feels funny" and states that her immediately feels like her legs give out from underneath her.  She tried using her Rollator yesterday to support her but had a third fall with similar symptoms.  She denies any room spinning sensation.  No loss of consciousness.  She did not hit her head.  She does take torsemide and states last week she had urinary symptoms that she lost about 6 pounds.  She was also noted to have a supratherapeutic INR of 7.8 at her PCP office on 03/13/2021 and she was asked to hold her Coumadin for several days and had repeat on 7/12 which was still elevated at 7.8.  For last few days she has noticed some nosebleeds but denies any blood per rectum or any dark stools.  Approximately 3 weeks ago she was started on bupropion for depression.  In the ED she was noted to be hypotensive down to the 90s over 60s and had an elevated WBC of 20.3.  Creatinine was elevated from her baseline of 0.5 and she was admitted for an AKI.  Given her fall she had a head CT and was negative.  She was admitted for presyncope and weakness in the setting of hypotension and dehydration likely suspected to overdiuresis with torsemide as well as for AKI.   AKI is improving with IVF Hydration and PT/OT recommending Home Health. Patient was  Orthostatic yesterday so she was given her another 1 Liter of IVF and TED hose and Maintenance IVF renewed but at 50 mL/hr. Repeat Orthostatic VS pending to be done. INR trending down and is now 3.7. She had an X-Ray of her Thigh and Hip yesterday due to Pain and it showed "Normal alignment. No fracture or dislocation. Mild left hip degenerative arthritis. Soft tissues are unremarkable." Because she continues to have a bruise and a knot on the Left thigh will obtain a Soft Tissue U/S to assess for Hematoma. Soft Tissue U/S showed  "Focused ultrasound of the area of concern along the left lateral thigh demonstrates soft tissue swelling without discrete fluid collection or hematoma. No soft tissue mass." Patient's Hgb/Hct continue to Drop so will check FOBT and a CT Scan of the Knee and a CT Abd/Pelvis w/o Contrast to evaluate for Retroperitoneal Hematoma.   Assessment & Plan:   Principal Problem:   Hypotension Active Problems:   H/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   Sleep apnea   Chronic diastolic CHF (congestive heart failure) (HCC)   Weakness   Depression   AKI (acute kidney injury) ([place])   Supratherapeutic INR  Presyncope/weakness secondary to hypotension in the setting Of Orthostatic Hypotension -Suspect that patient is over diuresed with her torsemide causing her to be volume depleted and hypotensive -Has received 1.5 L normal saline fluid in the ED. Give an additional 1  Liter bolus yesterday; Continue Maintenance IVF at 75 mL/hr but timed out and renewed at 50 mL/hr of LR overnight. -Added TED Hose -Hold antihypertensives -Check orthostatic vital signs; she still remains on the softer side with her blood pressure so we will continue to hold her antihypertensives. She was Positive for Orthostatic Hypotension again as her BP lying went from 140/76 -> 86/60 Sitting, and Standing was 96/51 -Will give a 500 mL Bolus given continued Orthostatic Hypotension  -Obtain PT and OT to  further evaluate and treat and recommending Home Health when Stable for D/C    AKI, improving  -Suspect due to overdiuresis with torsemide -Baseline creatinine is around 0.8; she presented with a BUNs/creatinine 35/2.01 and trending down 34/1.73 -> 28/1.43 -> 14/0.95 -> 11/1.02 -Avoid nephrotoxic medications, contrast dyes, hypotension and renally adjust medications -Follow-up with repeat labs after IV fluid resuscitation; IVF now stopped but will give a 500 mL Bolus  -Continue monitor and trend renal function carefully repeat CMP in a.m.  Hypokalemia -Patient's potassium is now 3.8 -Checked Mag Level and was 2.0 -Continue monitor and replete as necessary -Repeat CMP in a.m.   Supratherapeutic INR w/hx of aortic stenosis status post AVR  -INR 6.4 on admission and repeat was 6.5 yesterday; Now it is 2.5 -Suspect could be due to her AKI.  Only new medication was bupropion 2 weeks ago which is unlikely to cause. -Has had 2 days of nosebleed but no other significant findings of bleeding.  Hemoglobin is dropping -Will check CT Scan of the Abdomen and Pelvis to evaluate for Retroperitoneal Hematoma and investigate bruising on left Knee with a CT Scan  -Also check FOBT -Check INR daily   Chronic Diastolic heart failure -Continue to Monitor closely while receiving fluids -Currently appears dry and family thinks that her legs are looking the best they have usually been and this is usually what they are normally -Strict I's and O's and daily weights; Patient is +1.443 Liters  -Continue to monitor for signs and symptoms of volume overload  Erythrocytosis -> Normocytic Anemia and concern for bleeding  -Likely was hemoconcentrated on admission from dehydration and likley this is a dilutional drop but there is a concern for a Left Leg Hematoma due to the bruising and knot so will order a Soft Tissue U/S; Soft Tissue U/S Showed "Focused ultrasound of the area of concern along the left lateral thigh  demonstrates soft tissue swelling without discrete fluid collection or hematoma. No soft tissue mass." -Patient hemoglobin/hematocrit went from 17.1/53.4 -> 14.0/42.5 -> 12.0/37.4 -> 9.5/29.9;  -Checked Anemia Panel and showed an iron level of 30, U IBC 197, TIBC 227, saturation ratios of 13%, ferritin level 95, folate level 5.7, vitamin B12 263 -We will start vitamin B12 supplementation as well as for acid supplementation -Obtaining FOBT and Checking CT Scan of the Abdomen and Pelvis to evaluate for Retroperitoneal Hematoma and investigate bruising on left Knee with a CT Scan  -Continue to Monitor for S/Sx of Bleeding; No overt bleeding noted  -Continue monitor and trend and repeat CBC  Left Thigh Pain and Bruise -Concern for Hematoma Ruled out as U/S showed "Focused ultrasound of the area of concern along the left lateral thigh demonstrates soft tissue swelling without discrete fluid collection or hematoma. No soft tissue mass." -Had an X-Ray of the Left Hip and Femur that was Unremarkable -Pain Control and K Pad   OSA -Continue BiPAP   Depression -Continue Bupropion and Cymbalta   Hyperlipidemia -Continue with Pravastatin 40  mg p.o. daily   Leukocytosis, improved -Unclear etiology but likely in the setting of hemoconcentration from dehydration from overdiuresis -Patient WBC has gone from 20.3 -> 22.7 -> 11.1 -> 9.1 -> 9.0 -If persistently remains elevated we will do an infectious work-up and obtain a chest x-ray, urinalysis with urine culture as well as blood cultures -Continue to monitor off of Antibiotics and trend and repeat CBC  Morbid Obesity -Complicates overall prognosis and care -Estimated body mass index is 48.82 kg/m as calculated from the following:   Height as of this encounter: 5\' 1"  (1.549 m).   Weight as of this encounter: 117.2 kg. -Weight Loss and Dietary Counseling given   DVT prophylaxis: Holding Coumadin given Supratherapeutic INR at 3.7 Code Status: FULL  CODE Family Communication: No Family present at bedside  Disposition Plan: Pending further clinical improvement and evaluation by PT/OT   Status is: Inpatient  Remains inpatient appropriate because:Unsafe d/c plan, IV treatments appropriate due to intensity of illness or inability to take PO, and Inpatient level of care appropriate due to severity of illness  Dispo: The patient is from: Home              Anticipated d/c is to: Home              Patient currently is not medically stable to d/c.   Difficult to place patient No  Consultants:  None  Procedures: None  Antimicrobials:  Anti-infectives (From admission, onward)    None        Subjective: Seen and examined at bedside and blood count continues to drop.  Continues to feel weak.  Yesterday her orthostatics vital signs did drop again.  Continues to complain of left knee pain and left thigh pain.  No other concerns or complaints at this time.  Objective: Vitals:   03/26/21 0342 03/26/21 0614 03/26/21 0733 03/26/21 1119  BP:   128/76 136/69  Pulse: 80 83 100 95  Resp: 11 14 17  (!) 22  Temp:   98 F (36.7 C) 98 F (36.7 C)  TempSrc:   Oral Oral  SpO2: 95% 96% 96% 96%  Weight:  117.2 kg    Height:        Intake/Output Summary (Last 24 hours) at 03/26/2021 1457 Last data filed at 03/26/2021 1037 Gross per 24 hour  Intake 690 ml  Output 1450 ml  Net -760 ml    Filed Weights   03/24/21 0300 03/25/21 0355 03/26/21 0614  Weight: 113 kg 117.1 kg 117.2 kg   Examination: Physical Exam:  Constitutional: WN/WD morbidly obese Caucasian female in NAD and appears calm Eyes: Lids and conjunctivae normal, sclerae anicteric  ENMT: External Ears, Nose appear normal. Grossly normal hearing.  Neck: Appears normal, supple, no cervical masses, normal ROM, no appreciable thyromegaly; no JVD Respiratory: Diminished to auscultation bilaterally with coarse breath sounds, no wheezing, rales, rhonchi or crackles. Normal respiratory  effort and patient is not tachypenic. No accessory muscle use.  Unlabored breathing Cardiovascular: RRR, no murmurs / rubs / gallops. S1 and S2 auscultated.  Has some chronic lower extremity edema in the setting of her lymphedema Abdomen: Soft, non-tender, distended secondary body habitus. Bowel sounds positive.  GU: Deferred. Musculoskeletal: No clubbing / cyanosis of digits/nails. No joint deformity upper and lower extremities.  Skin: Has a bruise and swelling in her left knee as well as a left lateral sided bruise. No induration; Warm and dry.  Neurologic: CN 2-12 grossly intact with no focal deficits. Romberg  sign and cerebellar reflexes not assessed.  Psychiatric: Normal judgment and insight. Alert and oriented x 3. Normal mood and appropriate affect.   Data Reviewed: I have personally reviewed following labs and imaging studies  CBC: Recent Labs  Lab 03/22/21 1932 03/23/21 0547 03/24/21 0015 03/25/21 0014 03/26/21 0001  WBC 20.3* 22.7* 11.1* 9.1 9.0  NEUTROABS 16.9*  --  8.5* 5.7 5.6  HGB 17.1* 14.0 12.0 9.5* 7.9*  HCT 53.4* 42.5 37.4 29.9* 25.0*  MCV 87.0 84.8 86.2 87.2 89.6  PLT 257 225 175 159 539    Basic Metabolic Panel: Recent Labs  Lab 03/22/21 1932 03/23/21 0547 03/24/21 0015 03/25/21 0014 03/26/21 0001  NA 134* 135 134* 136 136  K 3.2* 2.9* 3.2* 3.7 3.8  CL 96* 100 98 104 103  CO2 24 25 29 27 26   GLUCOSE 146* 112* 117* 106* 116*  BUN 35* 34* 28* 14 11  CREATININE 2.01* 1.73* 1.43* 0.95 1.02*  CALCIUM 9.7 9.2 9.2 9.0 8.9  MG  --   --  2.0 2.0 2.0  PHOS  --   --  2.6 2.6 2.8    GFR: Estimated Creatinine Clearance: 64.8 mL/min (A) (by C-G formula based on SCr of 1.02 mg/dL (H)). Liver Function Tests: Recent Labs  Lab 03/22/21 1932 03/24/21 0015 03/25/21 0014 03/26/21 0001  AST 25 25 27 28   ALT 17 15 15 17   ALKPHOS 64 49 41 39  BILITOT 1.0 0.4 0.7 0.8  PROT 7.1 5.9* 5.7* 5.3*  ALBUMIN 3.5 2.9* 2.7* 2.5*    Recent Labs  Lab 03/22/21 1932   LIPASE 24    No results for input(s): AMMONIA in the last 168 hours. Coagulation Profile: Recent Labs  Lab 03/22/21 1932 03/23/21 0547 03/24/21 0749 03/25/21 0014 03/26/21 0001  INR 6.4* 6.5* 4.2* 3.7* 2.5*    Cardiac Enzymes: No results for input(s): CKTOTAL, CKMB, CKMBINDEX, TROPONINI in the last 168 hours. BNP (last 3 results) Recent Labs    06/06/20 0903  PROBNP 1,079*    HbA1C: No results for input(s): HGBA1C in the last 72 hours. CBG: Recent Labs  Lab 03/22/21 1931 03/23/21 0619 03/24/21 0612 03/25/21 0620 03/26/21 0617  GLUCAP 173* 120* 108* 103* 96    Lipid Profile: No results for input(s): CHOL, HDL, LDLCALC, TRIG, CHOLHDL, LDLDIRECT in the last 72 hours. Thyroid Function Tests: No results for input(s): TSH, T4TOTAL, FREET4, T3FREE, THYROIDAB in the last 72 hours.  Anemia Panel: Recent Labs    03/26/21 0859  VITAMINB12 263  FOLATE 5.7*  FERRITIN 95  TIBC 227*  IRON 30  RETICCTPCT 2.8   Sepsis Labs: No results for input(s): PROCALCITON, LATICACIDVEN in the last 168 hours.  Recent Results (from the past 240 hour(s))  Urine Culture     Status: Abnormal   Collection Time: 03/22/21  6:56 PM   Specimen: Urine, Clean Catch  Result Value Ref Range Status   Specimen Description URINE, CLEAN CATCH  Final   Special Requests NONE  Final   Culture (A)  Final    10,000 COLONIES/mL LACTOBACILLUS SPECIES Standardized susceptibility testing for this organism is not available. Performed at [facility], [place] [address], [place], [place] [address]    Report Status 03/24/2021 FINAL  Final  SARS CORONAVIRUS 2 (TAT 6-24 HRS) Nasopharyngeal Nasopharyngeal Swab     Status: None   Collection Time: 03/22/21  9:55 PM   Specimen: Nasopharyngeal Swab  Result Value Ref Range Status   SARS Coronavirus 2 NEGATIVE NEGATIVE  Final    Comment: (NOTE) SARS-CoV-2 target nucleic acids are NOT DETECTED.  The SARS-CoV-2 RNA is generally detectable in upper and  lower respiratory specimens during the acute phase of infection. Negative results do not preclude SARS-CoV-2 infection, do not rule out co-infections with other pathogens, and should not be used as the sole basis for treatment or other patient management decisions. Negative results must be combined with clinical observations, patient history, and epidemiological information. The expected result is Negative.  Fact Sheet for Patients: SugarRoll.be  Fact Sheet for Healthcare Providers: https://www.woods-mathews.com/  This test is not yet approved or cleared by the [place] FDA and  has been authorized for detection and/or diagnosis of SARS-CoV-2 by FDA under an Emergency Use Authorization (EUA). This EUA will remain  in effect (meaning this test can be used) for the duration of the COVID-19 declaration under Se ction 564(b)(1) of the Act, 21 U.S.C. section 360bbb-3(b)(1), unless the authorization is terminated or revoked sooner.  Performed at [facility], [place] [address] [place], [place]  [address]   MRSA Next Gen by PCR, Nasal     Status: Abnormal   Collection Time: 03/23/21  1:32 AM   Specimen: Nasal Mucosa; Nasal Swab  Result Value Ref Range Status   MRSA by PCR Next Gen DETECTED (A) NOT DETECTED Final    Comment: RESULT CALLED TO, READ BACK BY AND VERIFIED WITH: A. TEPE,RN 0430 03/23/2021 T. TYSOR (NOTE) The GeneXpert MRSA Assay (FDA approved for NASAL specimens only), is one component of a comprehensive MRSA colonization surveillance program. It is not intended to diagnose MRSA infection nor to guide or monitor treatment for MRSA infections. Test performance is not FDA approved in patients less than 89 years old. Performed at [facility], [place] [address], [place], [place] [address]      RN Pressure Injury Documentation:     Estimated body mass index is 48.82 kg/m as calculated from the following:   Height as  of this encounter: 5\' 1"  (1.549 m).   Weight as of this encounter: 117.2 kg.  Malnutrition Type:  Malnutrition Characteristics:  Nutrition Interventions:  Radiology Studies: DG Knee 1-2 Views Left  Result Date: 03/25/2021 CLINICAL DATA:  Left knee bruising and swelling. EXAM: LEFT KNEE - 1-2 VIEW COMPARISON:  Femur radiograph earlier today. FINDINGS: No fracture or dislocation. Mild medial compartment joint space narrowing. Mild tricompartmental peripheral spurring. Limited assessment for joint effusion on provided views. Soft tissue edema anteriorly. IMPRESSION: 1. Soft tissue edema without acute fracture or dislocation. 2. Mild tricompartmental osteoarthritis. Electronically Signed   By: Keith Rake M.D.   On: 03/25/2021 16:35   [place] LT LOWER EXTREM LTD SOFT TISSUE NON VASCULAR  Result Date: 03/25/2021 CLINICAL DATA:  Recent fall.  Bruising around the left knee. EXAM: ULTRASOUND LEFT LOWER EXTREMITY LIMITED TECHNIQUE: Ultrasound examination of the lower extremity soft tissues was performed in the area of clinical concern. COMPARISON:  Left femur x-rays from same day. FINDINGS: Focused ultrasound of the area of concern along the left lateral thigh demonstrates soft tissue swelling without discrete fluid collection or hematoma. No soft tissue mass. IMPRESSION: 1. Soft tissue swelling without evidence of discrete fluid collection or hematoma. Electronically Signed   By: Titus Dubin M.D.   On: 03/25/2021 10:07   DG HIP UNILAT WITH PELVIS 2-3 VIEWS LEFT  Result Date: 03/25/2021 CLINICAL DATA:  Fall, left hip pain EXAM: DG HIP (WITH OR WITHOUT PELVIS) 2-3V LEFT COMPARISON:  None. FINDINGS: Normal alignment.  No fracture or dislocation. Mild bilateral degenerative hip arthritis. Soft tissues are unremarkable. IMPRESSION: No acute fracture or dislocation. Electronically Signed   By: Fidela Salisbury MD   On: 03/25/2021 03:37   DG FEMUR MIN 2 VIEWS LEFT  Result Date: 03/25/2021 CLINICAL DATA:   Fall, left leg pain EXAM: LEFT FEMUR 2 VIEWS COMPARISON:  None. FINDINGS: Normal alignment. No fracture or dislocation. Mild left hip degenerative arthritis. Soft tissues are unremarkable. IMPRESSION: No acute fracture or dislocation. Electronically Signed   By: Fidela Salisbury MD   On: 03/25/2021 03:37    Scheduled Meds:  buPROPion  150 mg Oral Daily   diclofenac Sodium  2 g Topical QID   DULoxetine  60 mg Oral Daily   mupirocin ointment  1 application Nasal BID   oxybutynin  5 mg Oral QPM   polyethylene glycol  17 g Oral BID   pravastatin  40 mg Oral q1800   senna-docusate  1 tablet Oral BID   sodium chloride flush  3 mL Intravenous Q12H   Continuous Infusions:   LOS: 3 days   Kerney Elbe, DO [facility] PAGER is on AMION  If 7PM-7AM, please contact night-coverage [email or web address]

## 2021-03-26 NOTE — Progress Notes (Signed)
Occupational Therapy Treatment Patient Details Name: [name] MRN: [id number] DOB: 27-Jul-1956 Today's Date: 03/26/2021    History of present illness 65 y.o. female presenting to ED 7/14 with presyncopal episodes and recurrent falls x3 in 2 days. Patient admitted with presyncope/weakness secondary to hypotension and AKI. Labs (+) supratherapeutic INR w/hx of aortic valve repair. PMHx significant for aortic stenosis s/p TAVR on warfarin, chronic diastolic CHF, HTN, TIA, OSA on BiPAP.   OT comments  Pt making gradual progress towards OT goals. Limitations to safe ADL/mobility completion include: + orthostatics (denies dizziness), L thigh pain and poor standing tolerance. Pt overall Min A for basic transfers using Rollator to/from [facility]. Pt benefits from encouragement for LB ADL attempts though continues to require increased assist for these tasks, including toileting hygiene during this session. Educated on energy conservation (handout provided) and balance of implementing these strategies while also progressing endurance. Plan to further address standing tolerance during ADLs and tub transfers in next session. Pt reports her husband is home the majority of the time. If family unable to provide physical assist, SNF rehab must be considered.   Orthostatic BP readings: Lying: 140/76 (93) Sitting: 86/60 (66) Standing: 96/61 (66) Post-activity: 160/90 (106)  HR up to 158bpm with activity, 3/4 DOE   Follow Up Recommendations  Home health OT;Supervision/Assistance - 24 hour (SNF if family unable to provide light physical assistance)    Equipment Recommendations  3 in 1 bedside commode;Tub/shower bench    Recommendations for Other Services      Precautions / Restrictions Precautions Precautions: Fall Precaution Comments: + orthostatics, watch HR Restrictions Weight Bearing Restrictions: No       Mobility Bed Mobility Overal bed mobility: Needs Assistance Bed Mobility: Supine to Sit      Supine to sit: Supervision;HOB elevated     General bed mobility comments: Pt with use of railings and HOB elevated.    Transfers Overall transfer level: Needs assistance Equipment used: 4-wheeled walker Transfers: Sit to/from Stand Sit to Stand: Min assist Stand pivot transfers: Min assist       General transfer comment: Varied Min A for sit to stand, assist to stabilize DME as pt tended to pull on rollator. able to stand with min guard when pushing from [facility] armrest. Min A for rollator negotiation in turning, cues for locking brakes    Balance Overall balance assessment: Needs assistance Sitting-balance support: Feet supported Sitting balance-Leahy Scale: Good     Standing balance support: During functional activity Standing balance-Leahy Scale: Poor Standing balance comment: reliant on UE support                           ADL either performed or assessed with clinical judgement   ADL Overall ADL's : Needs assistance/impaired     Grooming: Set up;Sitting;Oral care Grooming Details (indicate cue type and reason): seated in recliner, unable to stand long enough for this task                 Toilet Transfer: Minimal assistance;Stand-pivot;BSC Toilet Transfer Details (indicate cue type and reason): using Rollator, Min A to move Rollator/BSC in optimal places during transfer. does better pushing up from [facility] armrest Toileting- Clothing Manipulation and Hygiene: Maximal assistance;Sit to/from stand Toileting - Clothing Manipulation Details (indicate cue type and reason): requests assist for underwear though encouraged pt to attempt task as well as she was doing this at home. ultimately, required assist for posteror hygiene after BM and  majority of underwear mgmt. discussed strategies at home, AE for improved reach, bidet attachments, etc       General ADL Comments: Pt limited by orthostatic BP, L thigh pain and poor standing tolerance. Educated on energy  conservation (handout provided) and encouraged balance of these strategies + progressing endurance     Vision   Vision Assessment?: No apparent visual deficits   Perception     Praxis      Cognition Arousal/Alertness: Awake/alert Behavior During Therapy: WFL for tasks assessed/performed Overall Cognitive Status: Within Functional Limits for tasks assessed                                          Exercises     Shoulder Instructions       General Comments HR up to 158bpm with activity, O2 WFL, BP + for orthostatics    Pertinent Vitals/ Pain       Pain Assessment: Faces Faces Pain Scale: Hurts little more Pain Location: L thigh Pain Descriptors / Indicators: Aching;Sore Pain Intervention(s): Monitored during session;Limited activity within patient's tolerance  Home Living                                          Prior Functioning/Environment              Frequency  Min 2X/week        Progress Toward Goals  OT Goals(current goals can now be found in the care plan section)  Progress towards OT goals: Progressing toward goals  Acute Rehab OT Goals Patient Stated Goal: manage pain OT Goal Formulation: With patient/family Time For Goal Achievement: 04/06/21 Potential to Achieve Goals: Good ADL Goals Pt Will Perform Grooming: with supervision;standing Pt Will Perform Upper Body Dressing: with supervision Pt Will Perform Lower Body Dressing: sit to/from stand;with min assist;with adaptive equipment Pt Will Transfer to Toilet: with supervision;ambulating Pt Will Perform Toileting - Clothing Manipulation and hygiene: with supervision;sit to/from stand Pt/caregiver will Perform Home Exercise Program: Increased ROM;Increased strength;With written HEP provided Additional ADL Goal #1: Patient will recall 3 energy conservation techniques in prep for ADLs/IADLs.  Plan Discharge plan remains appropriate;Equipment recommendations need  to be updated    Co-evaluation                 AM-PAC OT "6 Clicks" Daily Activity     Outcome Measure   Help from another person eating meals?: None Help from another person taking care of personal grooming?: A Little Help from another person toileting, which includes using toliet, bedpan, or urinal?: A Little Help from another person bathing (including washing, rinsing, drying)?: A Lot Help from another person to put on and taking off regular upper body clothing?: A Little Help from another person to put on and taking off regular lower body clothing?: A Lot 6 Click Score: 17    End of Session Equipment Utilized During Treatment: Other (comment) (Rollator)  OT Visit Diagnosis: Unsteadiness on feet (R26.81);Muscle weakness (generalized) (M62.81);Repeated falls (R29.6);Pain Pain - Right/Left: Left Pain - part of body: Leg   Activity Tolerance Patient tolerated treatment well   Patient Left in chair;with call bell/phone within reach;with chair alarm set   Nurse Communication Mobility status        Time: 2993-7169 OT Time Calculation (min):  47 min  Charges: OT General Charges $OT Visit: 1 Visit OT Treatments $Self Care/Home Management : 23-37 mins $Therapeutic Activity: 8-22 mins  Malachy Chamber, OTR/L Acute Rehab Services Office: [phone]    [name] 03/26/2021, 1:29 PM

## 2021-03-27 ENCOUNTER — Telehealth: Payer: Self-pay | Admitting: Cardiology

## 2021-03-27 ENCOUNTER — Telehealth: Payer: Self-pay | Admitting: Pulmonary Disease

## 2021-03-27 DIAGNOSIS — N179 Acute kidney failure, unspecified: Secondary | ICD-10-CM | POA: Diagnosis not present

## 2021-03-27 DIAGNOSIS — F32A Depression, unspecified: Secondary | ICD-10-CM | POA: Diagnosis not present

## 2021-03-27 DIAGNOSIS — I9589 Other hypotension: Secondary | ICD-10-CM | POA: Diagnosis not present

## 2021-03-27 DIAGNOSIS — I5032 Chronic diastolic (congestive) heart failure: Secondary | ICD-10-CM | POA: Diagnosis not present

## 2021-03-27 LAB — CBC WITH DIFFERENTIAL/PLATELET
Abs Immature Granulocytes: 0.04 10*3/uL (ref 0.00–0.07)
Abs Immature Granulocytes: 0.07 10*3/uL (ref 0.00–0.07)
Basophils Absolute: 0 10*3/uL (ref 0.0–0.1)
Basophils Absolute: 0 10*3/uL (ref 0.0–0.1)
Basophils Relative: 0 %
Basophils Relative: 0 %
Eosinophils Absolute: 0.6 10*3/uL — ABNORMAL HIGH (ref 0.0–0.5)
Eosinophils Absolute: 0.6 10*3/uL — ABNORMAL HIGH (ref 0.0–0.5)
Eosinophils Relative: 6 %
Eosinophils Relative: 6 %
HCT: 23.9 % — ABNORMAL LOW (ref 36.0–46.0)
HCT: 24.4 % — ABNORMAL LOW (ref 36.0–46.0)
Hemoglobin: 7.5 g/dL — ABNORMAL LOW (ref 12.0–15.0)
Hemoglobin: 7.7 g/dL — ABNORMAL LOW (ref 12.0–15.0)
Immature Granulocytes: 0 %
Immature Granulocytes: 1 %
Lymphocytes Relative: 20 %
Lymphocytes Relative: 15 %
Lymphs Abs: 2 10*3/uL (ref 0.7–4.0)
Lymphs Abs: 1.5 10*3/uL (ref 0.7–4.0)
MCH: 28.1 pg (ref 26.0–34.0)
MCH: 28 pg (ref 26.0–34.0)
MCHC: 31.4 g/dL (ref 30.0–36.0)
MCHC: 31.6 g/dL (ref 30.0–36.0)
MCV: 89.5 fL (ref 80.0–100.0)
MCV: 88.7 fL (ref 80.0–100.0)
Monocytes Absolute: 0.9 10*3/uL (ref 0.1–1.0)
Monocytes Absolute: 0.8 10*3/uL (ref 0.1–1.0)
Monocytes Relative: 9 %
Monocytes Relative: 8 %
Neutro Abs: 6.5 10*3/uL (ref 1.7–7.7)
Neutro Abs: 6.8 10*3/uL (ref 1.7–7.7)
Neutrophils Relative %: 65 %
Neutrophils Relative %: 70 %
Platelets: 201 10*3/uL (ref 150–400)
Platelets: 200 10*3/uL (ref 150–400)
RBC: 2.67 MIL/uL — ABNORMAL LOW (ref 3.87–5.11)
RBC: 2.75 MIL/uL — ABNORMAL LOW (ref 3.87–5.11)
RDW: 14.7 % (ref 11.5–15.5)
RDW: 14.9 % (ref 11.5–15.5)
WBC: 10.1 10*3/uL (ref 4.0–10.5)
WBC: 9.7 10*3/uL (ref 4.0–10.5)
nRBC: 0 % (ref 0.0–0.2)
nRBC: 0 % (ref 0.0–0.2)

## 2021-03-27 LAB — COMPREHENSIVE METABOLIC PANEL
ALT: 20 U/L (ref 0–44)
AST: 40 U/L (ref 15–41)
Albumin: 2.3 g/dL — ABNORMAL LOW (ref 3.5–5.0)
Alkaline Phosphatase: 50 U/L (ref 38–126)
Anion gap: 3 — ABNORMAL LOW (ref 5–15)
BUN: <5 mg/dL — ABNORMAL LOW (ref 8–23)
CO2: 29 mmol/L (ref 22–32)
Calcium: 9 mg/dL (ref 8.9–10.3)
Chloride: 105 mmol/L (ref 98–111)
Creatinine, Ser: 0.75 mg/dL (ref 0.44–1.00)
GFR, Estimated: >60 mL/min (ref >60)
Glucose, Bld: 94 mg/dL (ref 70–99)
Potassium: 4 mmol/L (ref 3.5–5.1)
Sodium: 137 mmol/L (ref 135–145)
Total Bilirubin: 1.1 mg/dL (ref 0.3–1.2)
Total Protein: 5.2 g/dL — ABNORMAL LOW (ref 6.5–8.1)

## 2021-03-27 LAB — GLUCOSE, CAPILLARY: Glucose-Capillary: 102 mg/dL — ABNORMAL HIGH (ref 70–99)

## 2021-03-27 LAB — PROTIME-INR
INR: 2.1 — ABNORMAL HIGH (ref 0.8–1.2)
Prothrombin Time: 23.7 seconds — ABNORMAL HIGH (ref 11.4–15.2)

## 2021-03-27 LAB — PHOSPHORUS: Phosphorus: 2.7 mg/dL (ref 2.5–4.6)

## 2021-03-27 LAB — MAGNESIUM: Magnesium: 2.1 mg/dL (ref 1.7–2.4)

## 2021-03-27 MED ORDER — WARFARIN - PHARMACIST DOSING INPATIENT: Freq: Every day | Status: DC

## 2021-03-27 MED ORDER — WARFARIN SODIUM 1 MG PO TABS
1.0000 mg | ORAL_TABLET | Freq: Once | ORAL | Status: AC
  Administered 2021-03-27: 1 mg via ORAL
  Filled 2021-03-27: qty 1

## 2021-03-27 MED ORDER — SODIUM CHLORIDE 0.9 % IV BOLUS
500.0000 mL | Freq: Once | INTRAVENOUS | Status: AC
  Administered 2021-03-27: 500 mL via INTRAVENOUS

## 2021-03-27 NOTE — Progress Notes (Signed)
PROGRESS NOTE    [name]  NTI:[id number] DOB: 1956-08-10 DOA: 03/22/2021 PCP: [name], MD   Brief Narrative:  The patient is a morbidly obese 65 year old Caucasian female with a past medical history significant for but not limited to aortic stenosis status post TAVR currently on anticoagulation with warfarin, chronic diastolic CHF, hypertension, history of TIA, history of obstructive sleep apnea on BiPAP as well as other comorbidities who presented to the hospital with presyncopal episodes.  She reports that for the last 2 to 3 days she has had falls 3 different times.  She states the falls happen when she is walking across the room and states that her "head feels funny" and states that her immediately feels like her legs give out from underneath her.  She tried using her Rollator yesterday to support her but had a third fall with similar symptoms.  She denies any room spinning sensation.  No loss of consciousness.  She did not hit her head.  She does take torsemide and states last week she had urinary symptoms that she lost about 6 pounds.  She was also noted to have a supratherapeutic INR of 7.8 at her PCP office on 03/13/2021 and she was asked to hold her Coumadin for several days and had repeat on 7/12 which was still elevated at 7.8.  For last few days she has noticed some nosebleeds but denies any blood per rectum or any dark stools.  Approximately 3 weeks ago she was started on bupropion for depression.  In the ED she was noted to be hypotensive down to the 90s over 60s and had an elevated WBC of 20.3.  Creatinine was elevated from her baseline of 0.5 and she was admitted for an AKI.  Given her fall she had a head CT and was negative.  She was admitted for presyncope and weakness in the setting of hypotension and dehydration likely suspected to overdiuresis with torsemide as well as for AKI.   AKI is improving with IVF Hydration and PT/OT recommending Home Health. Patient was  Orthostatic yesterday so she was given her another 1 Liter of IVF and TED hose and Maintenance IVF renewed but at 50 mL/hr. Repeat Orthostatic VS pending to be done. INR trending down and is now 3.7. She had an X-Ray of her Thigh and Hip yesterday due to Pain and it showed "Normal alignment. No fracture or dislocation. Mild left hip degenerative arthritis. Soft tissues are unremarkable." Because she continues to have a bruise and a knot on the Left thigh will obtain a Soft Tissue U/S to assess for Hematoma. Soft Tissue U/S showed  "Focused ultrasound of the area of concern along the left lateral thigh demonstrates soft tissue swelling without discrete fluid collection or hematoma. No soft tissue mass." Patient's Hgb/Hct continue to Drop so will check FOBT and a CT Scan of the Knee and a CT Abd/Pelvis w/o Contrast to evaluate for Retroperitoneal Hematoma.   CT scan of the abdomen pelvis showed suggestion of a trace volume of left retroperitoneal hemorrhage and the left proximal thigh superficial deep subcutaneous soft tissue edema with asymmetric enlarged left proximal thigh musculature compared to the right with possible underlying intramuscular hematoma not fully excluded.  There is no definitive organized fluid collection with limited evaluation on the noncontrasted study.  Patient did have a left pelvic sidewall lymphadenopathy as well as trace left pleural effusion and large bilateral simple renal cysts and aortic atherosclerosis.  Her CT scan of the knee  left knee was negative for mass or fluid collection but there was infiltration of the subcutaneous fat around the anterior aspect of the knee that could be due to soft tissue contusion given the patient's history of fall.  There is negative bony and acute joint abnormalities.  Her hemoglobin dropped further yesterday but improving repeat looks like it stabilized so we will give her a low-dose of Coumadin to see how she responds to this and started at 2 mg  tonight.  Assessment & Plan:   Principal Problem:   Hypotension Active Problems:   H/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   Sleep apnea   Chronic diastolic CHF (congestive heart failure) (HCC)   Weakness   Depression   AKI (acute kidney injury) ([place])   Supratherapeutic INR  Presyncope/weakness secondary to hypotension in the setting Of Orthostatic Hypotension -Suspect that patient is over diuresed with her torsemide causing her to be volume depleted and hypotensive -Has received 1.5 L normal saline fluid in the ED. Give an additional 1 Liter bolus the day before yesterday and will give a 500 mL bolus today given her continued orthostatics; Continue Maintenance IVF at 75 mL/hr but timed out and renewed at 50 mL/hr of LR overnight. -Added TED Hose -Hold antihypertensives -Check orthostatic vital signs; she still remains on the softer side with her blood pressure so we will continue to hold her antihypertensives.  Patient was still orthostatic as her blood pressure dropped from 140/76 down to 86/60 and then standing went up to 96/51 -She is given a 500 mL Bolus yesterday and today given continued Orthostatic Hypotension  -Obtain PT and OT to further evaluate and treat and recommending Home Health when Stable for D/C  -Will ensure TED hose worn and may need an abdominal binder   AKI, improving  -Suspect due to overdiuresis with torsemide -Baseline creatinine is around 0.8; she presented with a BUNs/creatinine 35/2.01 and trending down 34/1.73 -> 28/1.43 -> 14/0.95 -> 11/1.02 and is now less than 5/0.75 -Avoid nephrotoxic medications, contrast dyes, hypotension and renally adjust medications -Follow-up with repeat labs after IV fluid resuscitation; IVF now stopped but will give a 500 mL Bolus again -Continue monitor and trend renal function carefully repeat CMP in a.m.  Hypokalemia -Patient's potassium is now 4.0 -Checked Mag Level and was 2.1 -Continue monitor and replete as  necessary -Repeat CMP in a.m.   Supratherapeutic INR w/hx of aortic stenosis status post AVR  -INR 6.4 on admission and repeat was 6.5 yesterday; Now it is 2.1 -Suspect could be due to her AKI.  Only new medication was bupropion 2 weeks ago which is unlikely to cause. -Has had 2 days of nosebleed but no other significant findings of bleeding.  Hemoglobin is dropping and CT scan as above -CT scan of the abdomen pelvis showed suggestion of a trace volume of left retroperitoneal hemorrhage and the left proximal thigh superficial deep subcutaneous soft tissue edema with asymmetric enlarged left proximal thigh musculature compared to the right with possible underlying intramuscular hematoma not fully excluded.  There is no definitive organized fluid collection with limited evaluation on the noncontrasted study.  Patient did have a left pelvic sidewall lymphadenopathy as well as trace left pleural effusion and large bilateral simple renal cysts and aortic atherosclerosis.  Her CT scan of the knee left knee was negative for mass or fluid collection but there was infiltration of the subcutaneous fat around the anterior aspect of the knee that could be due to soft tissue  contusion given the patient's history of fall.  There is negative bony and acute joint abnormalities. -Also check FOBT -Check INR daily -Follow response to resuming her Coumadin   Chronic Diastolic heart failure -Continue to Monitor closely while receiving fluids -Currently appears dry and family thinks that her legs are looking the best they have usually been and this is usually what they are normally -Strict I's and O's and daily weights; Patient is + 1.136 liters  -Continue to monitor for signs and symptoms of volume overload  Erythrocytosis -> Normocytic Anemia and concern for bleeding and possible left-sided retroperitoneal hemorrhage and possible bleeding into her left thigh -Likely was hemoconcentrated on admission from dehydration  and likley this is a dilutional drop but there is a concern for a Left Leg Hematoma due to the bruising and knot so will order a Soft Tissue U/S; Soft Tissue U/S Showed "Focused ultrasound of the area of concern along the left lateral thigh demonstrates soft tissue swelling without discrete fluid collection or hematoma. No soft tissue mass." -Patient hemoglobin/hematocrit went from 17.1/53.4 -> 14.0/42.5 -> 12.0/37.4 -> 9.5/29.9 and further dropped to 7.9/25.0 yesterday and this morning was 7.5/23.9 and upon repeat was 7.7/24.4;  -Checked Anemia Panel and showed an iron level of 30, U IBC 197, TIBC 227, saturation ratios of 13%, ferritin level 95, folate level 5.7, vitamin B12 263 -We will start vitamin B12 supplementation as well as for acid supplementation -Checking FOBT and CT of the abdomen pelvis and CT of the knee as above -Continue to Monitor for S/Sx of Bleeding; No overt bleeding noted  -Continue monitor and trend and repeat CBC  Left Thigh Pain and Bruise -Concern for Hematoma Ruled out as U/S showed "Focused ultrasound of the area of concern along the left lateral thigh demonstrates soft tissue swelling without discrete fluid collection or hematoma. No soft tissue mass." -Had an X-Ray of the Left Hip and Femur that was Unremarkable -See above -Pain Control and K Pad   OSA -Continue BiPAP   Depression -Continue Bupropion and Cymbalta   Hyperlipidemia -Continue with Pravastatin 40 mg p.o. daily   Leukocytosis, improved -Unclear etiology but likely in the setting of hemoconcentration from dehydration from overdiuresis -Patient WBC has gone from 20.3 -> 22.7 -> 11.1 -> 9.1 -> 9.0 and trended up to 10.1 and today is now 9.7 -If persistently remains elevated we will do an infectious work-up and obtain a chest x-ray, urinalysis with urine culture as well as blood cultures -Continue to monitor off of Antibiotics and trend and repeat CBC  Morbid Obesity -Complicates overall prognosis  and care -Estimated body mass index is 48.95 kg/m as calculated from the following:   Height as of this encounter: 5\' 1"  (1.549 m).   Weight as of this encounter: 117.5 kg. -Weight Loss and Dietary Counseling given   DVT prophylaxis: Holding Coumadin but now will resume given that her INR now 2.1 Code Status: FULL CODE Family Communication: Discussed with the daughter at bedside Disposition Plan: Pending further clinical improvement and evaluation by PT/OT   Status is: Inpatient  Remains inpatient appropriate because:Unsafe d/c plan, IV treatments appropriate due to intensity of illness or inability to take PO, and Inpatient level of care appropriate due to severity of illness  Dispo: The patient is from: Home              Anticipated d/c is to: Home              Patient currently is not  medically stable to d/c.   Difficult to place patient No  Consultants:  None  Procedures: None  Antimicrobials:  Anti-infectives (From admission, onward)    None        Subjective: Seen and examined at bedside continues to have some left-sided leg pain.  No nausea or vomiting.  Denies any chest pain or shortness of breath.  Blood pressure still dropped when she stood up.  We will give her some more IV fluids.  Thinks she is doing little bit better.  Has been urinating frequently but denies any burning or discomfort.  Overall feels a little bit better today than she did yesterday.  Objective: Vitals:   03/27/21 0300 03/27/21 0400 03/27/21 0736 03/27/21 1031  BP: 138/78  (!) 145/92 130/69  Pulse: 88  (!) 104 100  Resp: 17  16 20   Temp: 98.8 F (37.1 C) 98.2 F (36.8 C) 98.8 F (37.1 C) 98.7 F (37.1 C)  TempSrc: Oral Oral Oral Oral  SpO2: 96%  95% 93%  Weight: 117.5 kg     Height:        Intake/Output Summary (Last 24 hours) at 03/27/2021 1639 Last data filed at 03/27/2021 1500 Gross per 24 hour  Intake 1042.4 ml  Output 1350 ml  Net -307.6 ml    Filed Weights   03/25/21  0355 03/26/21 0614 03/27/21 0300  Weight: 117.1 kg 117.2 kg 117.5 kg   Examination: Physical Exam:  Constitutional: WN/WD morbidly obese Caucasian female currently no acute distress appears calm Eyes: Lids and conjunctivae normal, sclerae anicteric  ENMT: External Ears, Nose appear normal. Grossly normal hearing.  Neck: Appears normal, supple, no cervical masses, normal ROM, no appreciable thyromegaly; no JVD Respiratory: Diminished to auscultation bilaterally, no wheezing, rales, rhonchi or crackles. Normal respiratory effort and patient is not tachypenic. No accessory muscle use.  Unlabored breathing Cardiovascular: RRR, no murmurs / rubs / gallops. S1 and S2 auscultated.  Has some chronic lower extremity edema in the setting of her lymphedema Abdomen: Soft, non-tender, distended secondary body. Bowel sounds positive.  GU: Deferred. Musculoskeletal: No clubbing / cyanosis of digits/nails. No joint deformity upper and lower extremities.  Skin: No rashes, lesions, ulcers patient on limited skin evaluation but does have some bruising and swelling noted left knee as well as leg lateral sided bruise and left knee bruising. No induration; Warm and dry.  Neurologic: CN 2-12 grossly intact with no focal deficits. Romberg sign and cerebellar reflexes not assessed.  Psychiatric: Normal judgment and insight. Alert and oriented x 3. Normal mood and appropriate affect.   Data Reviewed: I have personally reviewed following labs and imaging studies  CBC: Recent Labs  Lab 03/24/21 0015 03/25/21 0014 03/26/21 0001 03/27/21 0020 03/27/21 1505  WBC 11.1* 9.1 9.0 10.1 9.7  NEUTROABS 8.5* 5.7 5.6 6.5 6.8  HGB 12.0 9.5* 7.9* 7.5* 7.7*  HCT 37.4 29.9* 25.0* 23.9* 24.4*  MCV 86.2 87.2 89.6 89.5 88.7  PLT 175 159 178 201 712    Basic Metabolic Panel: Recent Labs  Lab 03/23/21 0547 03/24/21 0015 03/25/21 0014 03/26/21 0001 03/27/21 0020  NA 135 134* 136 136 137  K 2.9* 3.2* 3.7 3.8 4.0  CL 100  98 104 103 105  CO2 25 29 27 26 29   GLUCOSE 112* 117* 106* 116* 94  BUN 34* 28* 14 11 <5*  CREATININE 1.73* 1.43* 0.95 1.02* 0.75  CALCIUM 9.2 9.2 9.0 8.9 9.0  MG  --  2.0 2.0 2.0 2.1  PHOS  --  2.6 2.6 2.8 2.7    GFR: Estimated Creatinine Clearance: 82.7 mL/min (by C-G formula based on SCr of 0.75 mg/dL). Liver Function Tests: Recent Labs  Lab 03/22/21 1932 03/24/21 0015 03/25/21 0014 03/26/21 0001 03/27/21 0020  AST 25 25 27 28  40  ALT 17 15 15 17 20   ALKPHOS 64 49 41 39 50  BILITOT 1.0 0.4 0.7 0.8 1.1  PROT 7.1 5.9* 5.7* 5.3* 5.2*  ALBUMIN 3.5 2.9* 2.7* 2.5* 2.3*    Recent Labs  Lab 03/22/21 1932  LIPASE 24    No results for input(s): AMMONIA in the last 168 hours. Coagulation Profile: Recent Labs  Lab 03/23/21 0547 03/24/21 0749 03/25/21 0014 03/26/21 0001 03/27/21 0020  INR 6.5* 4.2* 3.7* 2.5* 2.1*    Cardiac Enzymes: No results for input(s): CKTOTAL, CKMB, CKMBINDEX, TROPONINI in the last 168 hours. BNP (last 3 results) Recent Labs    06/06/20 0903  PROBNP 1,079*    HbA1C: No results for input(s): HGBA1C in the last 72 hours. CBG: Recent Labs  Lab 03/23/21 0619 03/24/21 0612 03/25/21 0620 03/26/21 0617 03/27/21 0534  GLUCAP 120* 108* 103* 96 102*    Lipid Profile: No results for input(s): CHOL, HDL, LDLCALC, TRIG, CHOLHDL, LDLDIRECT in the last 72 hours. Thyroid Function Tests: No results for input(s): TSH, T4TOTAL, FREET4, T3FREE, THYROIDAB in the last 72 hours.  Anemia Panel: Recent Labs    03/26/21 0859  VITAMINB12 263  FOLATE 5.7*  FERRITIN 95  TIBC 227*  IRON 30  RETICCTPCT 2.8    Sepsis Labs: No results for input(s): PROCALCITON, LATICACIDVEN in the last 168 hours.  Recent Results (from the past 240 hour(s))  Urine Culture     Status: Abnormal   Collection Time: 03/22/21  6:56 PM   Specimen: Urine, Clean Catch  Result Value Ref Range Status   Specimen Description URINE, CLEAN CATCH  Final   Special Requests NONE   Final   Culture (A)  Final    10,000 COLONIES/mL LACTOBACILLUS SPECIES Standardized susceptibility testing for this organism is not available. Performed at [facility], [place] [address] [place], [place] [address]    Report Status 03/24/2021 FINAL  Final  SARS CORONAVIRUS 2 (TAT 6-24 HRS) Nasopharyngeal Nasopharyngeal Swab     Status: None   Collection Time: 03/22/21  9:55 PM   Specimen: Nasopharyngeal Swab  Result Value Ref Range Status   SARS Coronavirus 2 NEGATIVE NEGATIVE Final    Comment: (NOTE) SARS-CoV-2 target nucleic acids are NOT DETECTED.  The SARS-CoV-2 RNA is generally detectable in upper and lower respiratory specimens during the acute phase of infection. Negative results do not preclude SARS-CoV-2 infection, do not rule out co-infections with other pathogens, and should not be used as the sole basis for treatment or other patient management decisions. Negative results must be combined with clinical observations, patient history, and epidemiological information. The expected result is Negative.  Fact Sheet for Patients: SugarRoll.be  Fact Sheet for Healthcare Providers: https://www.woods-mathews.com/  This test is not yet approved or cleared by the [place] FDA and  has been authorized for detection and/or diagnosis of SARS-CoV-2 by FDA under an Emergency Use Authorization (EUA). This EUA will remain  in effect (meaning this test can be used) for the duration of the COVID-19 declaration under Se ction 564(b)(1) of the Act, 21 U.S.C. section 360bbb-3(b)(1), unless the authorization is terminated or revoked sooner.  Performed at [facility], [place] [address] [place], [place] [address]  MRSA Next Gen by PCR, Nasal     Status: Abnormal   Collection Time: 03/23/21  1:32 AM   Specimen: Nasal Mucosa; Nasal Swab  Result Value Ref Range Status   MRSA by PCR Next Gen DETECTED (A) NOT DETECTED Final    Comment:  RESULT CALLED TO, READ BACK BY AND VERIFIED WITH: A. TEPE,RN 0430 03/23/2021 T. TYSOR (NOTE) The GeneXpert MRSA Assay (FDA approved for NASAL specimens only), is one component of a comprehensive MRSA colonization surveillance program. It is not intended to diagnose MRSA infection nor to guide or monitor treatment for MRSA infections. Test performance is not FDA approved in patients less than 68 years old. Performed at [facility], [place] [address], [place], [place] [address]     RN Pressure Injury Documentation:     Estimated body mass index is 48.95 kg/m as calculated from the following:   Height as of this encounter: 5\' 1"  (1.549 m).   Weight as of this encounter: 117.5 kg.  Malnutrition Type:  Malnutrition Characteristics:  Nutrition Interventions:  Radiology Studies: CT ABDOMEN PELVIS WO CONTRAST  Result Date: 03/26/2021 CLINICAL DATA:  Anemia.  Soft tissue mass knee. EXAM: CT ABDOMEN AND PELVIS WITHOUT CONTRAST TECHNIQUE: Multidetector CT imaging of the abdomen and pelvis was performed following the standard protocol without IV contrast. COMPARISON:  None. FINDINGS: Lower chest: Trace left pleural effusion. Hepatobiliary: No focal liver abnormality. Status post cholecystectomy. No biliary dilatation. Pancreas: No focal lesion. Normal pancreatic contour. No surrounding inflammatory changes. No main pancreatic ductal dilatation. Spleen: Normal in size without focal abnormality. Adrenals/Urinary Tract: No adrenal nodule bilaterally. No nephrolithiasis, no hydronephrosis. Bilateral fluid density lesions measuring up to 12.5 cm on the right and 9 cm on the left likely representing simple renal cysts. No ureterolithiasis or hydroureter. The urinary bladder is unremarkable. Stomach/Bowel: High density material within the gastric lumen likely ingested medication/food. Stomach is within normal limits. No evidence of bowel wall thickening or dilatation. Scattered colonic diverticulosis.  Appendix appears normal. Vascular/Lymphatic: No abdominal aorta or iliac aneurysm.Mild atherosclerotic plaque. Asymmetrically prominent but nonenlarged left inguinal lymph nodes. Enlarged left pelvic sidewall 1.4 cm lymph node (1:69). Borderline enlarged left retroperitoneal lymph nodes (49, 1:54). Reproductive: T shaped intrauterine device noted within the uterus in grossly appropriate position. Otherwise uterus and bilateral adnexa are unremarkable. Other: Trace left retroperitoneal free fluid and fat stranding (1:42-55). No intraperitoneal free fluid. No intraperitoneal free gas. No organized fluid collection. Musculoskeletal: Asymmetrically enlarged left proximal thigh musculature compared to the right. Left proximal thigh superficial and deep subcutaneus soft tissue edema. No suspicious lytic or blastic osseous lesions. No acute displaced fracture. Levoscoliosis of the lumbar spine with associated multilevel degenerative changes of the spine. IMPRESSION: 1. Suggestion of a trace volume left retroperitoneal hemorrhage. 2. Left proximal thigh superficial and deep subcutaneus soft tissue edema with asymmetrically enlarged left proximal thigh musculature compared to the right. Underlying intramuscular hematoma not excluded. No definite organized fluid collection with limited evaluation on this noncontrast study. 3. Left pelvic sidewall lymphadenopathy. 4. Trace left pleural effusion. 5. Large bilateral simple renal cysts measuring up to 12.5 cm on the right. 6.  Aortic Atherosclerosis (ICD10-I70.0). 7. Please note limited evaluation on this noncontrast study. These results will be called to the ordering clinician or representative by the Radiologist Assistant, and communication documented in the PACS or [employer]. Electronically Signed   By: Iven Finn M.D.   On: 03/26/2021 19:46   CT KNEE LEFT WO CONTRAST  Result  Date: 03/26/2021 CLINICAL DATA:  Anemic patient with a soft tissue mass of the left  knee. History of a recent fall. EXAM: CT OF THE LEFT KNEE WITHOUT CONTRAST TECHNIQUE: Multidetector CT imaging of the left knee was performed according to the standard protocol. Multiplanar CT image reconstructions were also generated. COMPARISON:  Plain films left knee 03/25/2021. Ultrasound soft tissues about the left knee 03/25/2021. FINDINGS: Bones/Joint/Cartilage There is no acute or focal bony abnormality. No lytic or sclerotic lesion. Small osteophytes are present about the knee. Joint space narrowing is most notable medially. Ligaments Suboptimally assessed by CT. Muscles and Tendons Intact.  No intramuscular fluid collection or mass. Soft tissues No fluid collection or mass. There is some subcutaneous edema about the knee, most notable anteriorly. No radiopaque foreign body or soft tissue gas. IMPRESSION: Negative for mass or fluid collection. Infiltration of subcutaneous fat about the anterior aspect of the knee could be due to soft tissue contusion given the patient's history of fall. Dependent change or cellulitis are also possible. Negative for acute bony or joint abnormality. 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 Electronically Signed   By: Inge Rise M.D.   On: 03/26/2021 19:25    Scheduled Meds:  buPROPion  150 mg Oral Daily   diclofenac Sodium  2 g Topical QID   DULoxetine  60 mg Oral Daily   folic acid  1 mg Oral Daily   mupirocin ointment  1 application Nasal BID   oxybutynin  5 mg Oral QPM   polyethylene glycol  17 g Oral BID   pravastatin  40 mg Oral q1800   senna-docusate  1 tablet Oral BID   sodium chloride flush  3 mL Intravenous Q12H   vitamin B-12  1,000 mcg Oral Daily   Continuous Infusions:   LOS: 4 days   Kerney Elbe, DO [facility] PAGER is on AMION  If 7PM-7AM, please contact night-coverage [email or web address]

## 2021-03-27 NOTE — Telephone Encounter (Signed)
Patient calling to speak with Legrand Como in regards to her INR. She states she is currently in the hospital.

## 2021-03-27 NOTE — Progress Notes (Signed)
ANTICOAGULATION CONSULT NOTE - Initial Consult  Pharmacy Consult for warfarin Indication: atrial fibrillation  Allergies  Allergen Reactions   Vancomycin Itching    Patient can receive vancomycin with slower infusion and prn benadryl for itching     Patient Measurements: Height: 5\' 1"  (154.9 cm) Weight: 117.5 kg (259 lb 0.7 oz) IBW/kg (Calculated) : 47.8   Vital Signs: Temp: 98.7 F (37.1 C) (07/19 1031) Temp Source: Oral (07/19 1031) BP: 130/69 (07/19 1031) Pulse Rate: 100 (07/19 1031)  Labs: Recent Labs    03/25/21 0014 03/26/21 0001 03/27/21 0020 03/27/21 1505  HGB 9.5* 7.9* 7.5* 7.7*  HCT 29.9* 25.0* 23.9* 24.4*  PLT 159 178 201 200  LABPROT 36.6* 27.2* 23.7*  --   INR 3.7* 2.5* 2.1*  --   CREATININE 0.95 1.02* 0.75  --     Estimated Creatinine Clearance: 82.7 mL/min (by C-G formula based on SCr of 0.75 mg/dL).   Medical History: Past Medical History:  Diagnosis Date   Anemia 06/03/2019   Aortic stenosis 03/23/2013   Overview:  02/18/13 TTE EF >55%. Critical AS with mean Ao valve gradient of 82 mm Hg. No AI. No MR, PR, mild TR. Estimated RVSP 30 mm Hg.   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 Postmenopausal bleeding since for the past 2 years.  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 Last biopsy was in May of 2019 which showed weakly proliferative endometrium possibly a polyp.     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      Assessment: 65yof with HX bAVR/TIA (hx valve thrombosis) on warfarin pta 1.25mg  TTSS and 2.5mg  MWF Admit INR 7 with RP bleed and dropping hgb  Warfarin has been on hold  H/h low but stable, INR now 2.1 Discussed with MD  Resume low dose warfarin and watch CBC   Goal of Therapy:  INR 2-3 Monitor platelets by anticoagulation protocol: Yes   Plan:  Warfarin 1mg  x1 Cbc and Protime daily    Bonnita Nasuti Pharm.D. CPP, BCPS Clinical Pharmacist [phone] 03/27/2021 4:50 PM

## 2021-03-27 NOTE — Plan of Care (Signed)

## 2021-03-27 NOTE — Progress Notes (Signed)
Physical Therapy Treatment Patient Details Name: [name] MRN: [id number] DOB: January 05, 1956 Today's Date: 03/27/2021    History of Present Illness 65 y.o. female presenting to ED 7/14 with presyncopal episodes and recurrent falls x3 in 2 days. Patient admitted with presyncope/weakness secondary to hypotension and AKI. Labs (+) supratherapeutic INR w/hx of aortic valve repair. PMHx significant for aortic stenosis s/p TAVR on warfarin, chronic diastolic CHF, HTN, TIA, OSA on BiPAP.    PT Comments    Pt received in supine, agreeable to therapy session and with good participation and tolerance for short household distance gait task using rollator. Pt with TED hose donned and reported no acute s/sx distress although HR to 130's bpm and antalgic gait pattern. RN notified of pt elevated BP as well. Pt continues to benefit from PT services to progress toward functional mobility goals.   Follow Up Recommendations  Home health PT;Supervision for mobility/OOB     Equipment Recommendations  Wheelchair (measurements PT);Wheelchair cushion (measurements PT);3in1 (PT)    Recommendations for Other Services       Precautions / Restrictions Precautions Precautions: Fall Precaution Comments: + orthostatics, watch HR Restrictions Weight Bearing Restrictions: No    Mobility  Bed Mobility      General bed mobility comments: received in chair    Transfers Overall transfer level: Needs assistance Equipment used: 4-wheeled walker Transfers: Sit to/from Stand Sit to Stand: Min assist         General transfer comment: min guard to rise but minA to stabilize once standing at rollator; good hand placement to push from chair armrests  Ambulation/Gait Ambulation/Gait assistance: Min guard Gait Distance (Feet): 24 Feet Assistive device: 4-wheeled walker Gait Pattern/deviations: Step-to pattern;Step-through pattern;Decreased stride length;Antalgic;Trunk flexed     General Gait Details: Pt  with slow antalgic gait, needed standing break x1 due to elevated HR (to mid-130's bpm) and LLE pain, but able to progress distance afterward to short household distance; moderate reliance on rollator for support due to leg pain; no dizziness       Balance Overall balance assessment: Needs assistance Sitting-balance support: Feet supported Sitting balance-Leahy Scale: Good     Standing balance support: During functional activity Standing balance-Leahy Scale: Poor Standing balance comment: reliant on UE support                            Cognition Arousal/Alertness: Awake/alert Behavior During Therapy: WFL for tasks assessed/performed Overall Cognitive Status: Within Functional Limits for tasks assessed                                 General Comments: pleasant, cooperative      Exercises Other Exercises Other Exercises: seated BLE AROM: ankle pumps, LAQ x5 reps ea; verbally instructed pt also on some supine exercises, plan to bring HEP handout next session to reinforce    General Comments General comments (skin integrity, edema, etc.): SBP 150's prior to standing, then BP 165/82 (106); HR tachy to 135 bpm with exertion, HR 100 bpm resting      Pertinent Vitals/Pain Pain Assessment: Faces Pain Score: 7  Pain Location: L thigh Pain Descriptors / Indicators: Aching;Sore Pain Intervention(s): Limited activity within patient's tolerance;Monitored during session;Repositioned;Other (comment) (offered ice, pt states she will obtain once back in bed)    Home Living  Prior Function            PT Goals (current goals can now be found in the care plan section) Acute Rehab PT Goals Patient Stated Goal: manage pain PT Goal Formulation: With patient Time For Goal Achievement: 04/07/21 Progress towards PT goals: Progressing toward goals    Frequency    Min 3X/week      PT Plan Current plan remains appropriate     Co-evaluation              AM-PAC PT "6 Clicks" Mobility   Outcome Measure  Help needed turning from your back to your side while in a flat bed without using bedrails?: None Help needed moving from lying on your back to sitting on the side of a flat bed without using bedrails?: None Help needed moving to and from a bed to a chair (including a wheelchair)?: A Little Help needed standing up from a chair using your arms (e.g., wheelchair or bedside chair)?: A Little Help needed to walk in hospital room?: A Little Help needed climbing 3-5 steps with a railing? : Total 6 Click Score: 18    End of Session   Activity Tolerance: Patient tolerated treatment well;Patient limited by pain Patient left: in chair;with call bell/phone within reach;Other (comment) (per RN OK to leave alarm off pt able to demo use of call bell) Nurse Communication: Mobility status;Other (comment) (HTN, tachy with exertion) PT Visit Diagnosis: Unsteadiness on feet (R26.81);Other abnormalities of gait and mobility (R26.89);Repeated falls (R29.6);Muscle weakness (generalized) (M62.81);History of falling (Z91.81);Difficulty in walking, not elsewhere classified (R26.2);Pain Pain - Right/Left: Left Pain - part of body:  (thigh)     Time: 7711-6579 PT Time Calculation (min) (ACUTE ONLY): 24 min  Charges:  $Gait Training: 8-22 mins $Therapeutic Exercise: 8-22 mins                     Martavious Hartel P., PTA Acute Rehabilitation Services Pager: [phone] Office: [place] 03/27/2021, 6:29 PM

## 2021-03-27 NOTE — Telephone Encounter (Signed)
I spoke to the patient and discussed INR results and further dosing after hospital stay. She verbalized understanding.

## 2021-03-27 NOTE — Telephone Encounter (Signed)
Called and spoke with Patient.  Patient was wanting information on other sleep providers.  Patient was seen by Dr.Olalere 08/26/19, and saw Aaron Edelman, NP 10/07/19. Patient is needing yearly OSA follow up for her CPAP.  Patient stated she has a CPAP that is on the list to be replaced and was concerned if follow up was needed.  Advised Patient she needed yearly follow up for insurance and to continue supplies.  Advised Patient to bring SD card to OV. Patient scheduled 04/16/21, with Eustaquio Maize, NP.

## 2021-03-27 NOTE — Progress Notes (Signed)
Mobility Specialist: Progress Note   03/27/21 1547  Mobility  Activity Ambulated in room  Level of Assistance Contact guard assist, steadying assist  Assistive Device Four wheel walker  Distance Ambulated (ft) 24 ft (12'+12')  Mobility Ambulated with assistance in room  Mobility Response Tolerated well  Mobility performed by Mobility specialist  Bed Position Chair  $Mobility charge 1 Mobility   Pre-Mobility: 106 HR, 96% SpO2 During Mobility: 141 HR, 95% SpO2 Post-Mobility: 111 HR, 159/91 BP, 95% SpO2  Pt c/o pain in her LLE during ambulation, no rating given. Pt otherwise asx. Pt required minA to sit SOB from supine and contact guard when standing and ambulating. Pt sitting in the recliner with call bell in her lap and pt's husband present in the room.   [facility] [place] Mobility Specialist Mobility Specialist Phone: [phone]

## 2021-03-28 ENCOUNTER — Inpatient Hospital Stay (HOSPITAL_COMMUNITY): Payer: Medicare Other

## 2021-03-28 DIAGNOSIS — I9589 Other hypotension: Secondary | ICD-10-CM | POA: Diagnosis not present

## 2021-03-28 DIAGNOSIS — N179 Acute kidney failure, unspecified: Secondary | ICD-10-CM | POA: Diagnosis not present

## 2021-03-28 DIAGNOSIS — E860 Dehydration: Secondary | ICD-10-CM

## 2021-03-28 DIAGNOSIS — I5032 Chronic diastolic (congestive) heart failure: Secondary | ICD-10-CM | POA: Diagnosis not present

## 2021-03-28 LAB — CBC WITH DIFFERENTIAL/PLATELET
Abs Immature Granulocytes: 0.05 10*3/uL (ref 0.00–0.07)
Basophils Absolute: 0.1 10*3/uL (ref 0.0–0.1)
Basophils Relative: 1 %
Eosinophils Absolute: 0.7 10*3/uL — ABNORMAL HIGH (ref 0.0–0.5)
Eosinophils Relative: 6 %
HCT: 23.3 % — ABNORMAL LOW (ref 36.0–46.0)
Hemoglobin: 7.3 g/dL — ABNORMAL LOW (ref 12.0–15.0)
Immature Granulocytes: 0 %
Lymphocytes Relative: 17 %
Lymphs Abs: 2 10*3/uL (ref 0.7–4.0)
MCH: 28 pg (ref 26.0–34.0)
MCHC: 31.3 g/dL (ref 30.0–36.0)
MCV: 89.3 fL (ref 80.0–100.0)
Monocytes Absolute: 1.1 10*3/uL — ABNORMAL HIGH (ref 0.1–1.0)
Monocytes Relative: 9 %
Neutro Abs: 8.1 10*3/uL — ABNORMAL HIGH (ref 1.7–7.7)
Neutrophils Relative %: 67 %
Platelets: 225 10*3/uL (ref 150–400)
RBC: 2.61 MIL/uL — ABNORMAL LOW (ref 3.87–5.11)
RDW: 15 % (ref 11.5–15.5)
WBC: 12 10*3/uL — ABNORMAL HIGH (ref 4.0–10.5)
nRBC: 0 % (ref 0.0–0.2)

## 2021-03-28 LAB — COMPREHENSIVE METABOLIC PANEL
ALT: 19 U/L (ref 0–44)
AST: 30 U/L (ref 15–41)
Albumin: 2.5 g/dL — ABNORMAL LOW (ref 3.5–5.0)
Alkaline Phosphatase: 51 U/L (ref 38–126)
Anion gap: 4 — ABNORMAL LOW (ref 5–15)
BUN: 6 mg/dL — ABNORMAL LOW (ref 8–23)
CO2: 26 mmol/L (ref 22–32)
Calcium: 9 mg/dL (ref 8.9–10.3)
Chloride: 105 mmol/L (ref 98–111)
Creatinine, Ser: 0.76 mg/dL (ref 0.44–1.00)
GFR, Estimated: >60 mL/min (ref >60)
Glucose, Bld: 115 mg/dL — ABNORMAL HIGH (ref 70–99)
Potassium: 4.1 mmol/L (ref 3.5–5.1)
Sodium: 135 mmol/L (ref 135–145)
Total Bilirubin: 1.4 mg/dL — ABNORMAL HIGH (ref 0.3–1.2)
Total Protein: 5.8 g/dL — ABNORMAL LOW (ref 6.5–8.1)

## 2021-03-28 LAB — PREPARE RBC (CROSSMATCH)

## 2021-03-28 LAB — PHOSPHORUS: Phosphorus: 3.1 mg/dL (ref 2.5–4.6)

## 2021-03-28 LAB — MAGNESIUM: Magnesium: 2.1 mg/dL (ref 1.7–2.4)

## 2021-03-28 LAB — PROTIME-INR
INR: 1.8 — ABNORMAL HIGH (ref 0.8–1.2)
Prothrombin Time: 21.2 seconds — ABNORMAL HIGH (ref 11.4–15.2)

## 2021-03-28 LAB — GLUCOSE, CAPILLARY: Glucose-Capillary: 96 mg/dL (ref 70–99)

## 2021-03-28 LAB — ABO/RH: ABO/RH(D): A POS

## 2021-03-28 IMAGING — CT CT FEMUR *L* W/O CM
3 of 5 series · 14 of 33 positions shown, 16 images · non-contrast
Comparison: CT left knee dated [DATE]. Left femur x-rays
dated [DATE].

CLINICAL DATA: Left thigh bruising and low hemoglobin. Evaluate for
hematoma.

EXAM:
CT OF THE LOWER LEFT EXTREMITY WITHOUT CONTRAST
TECHNIQUE: Multidetector CT imaging of the left thigh was performed according
to the standard protocol.

[Series 5: extremity soft tissue · axial · 0.55mm/px · z∈[-537,-107]mm · 8 of 255 slices shown, 10 images]
[im 20/255  soft-tissue]
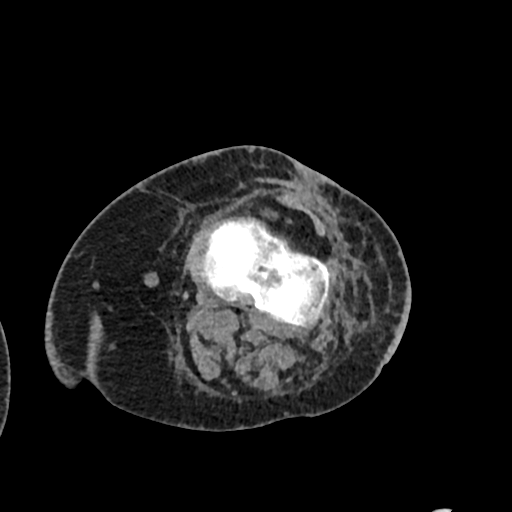
[im 20/255  bone]
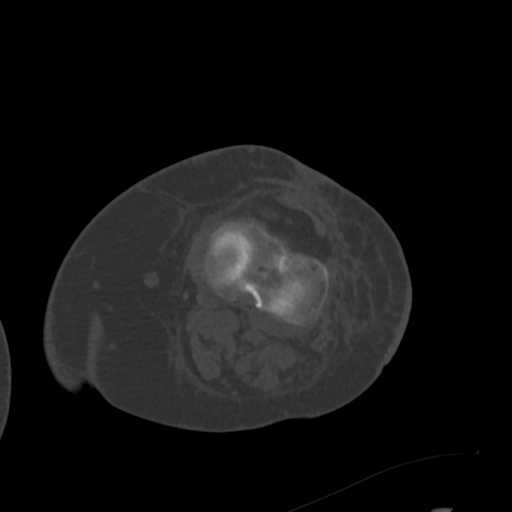
[im 59/255  bone]
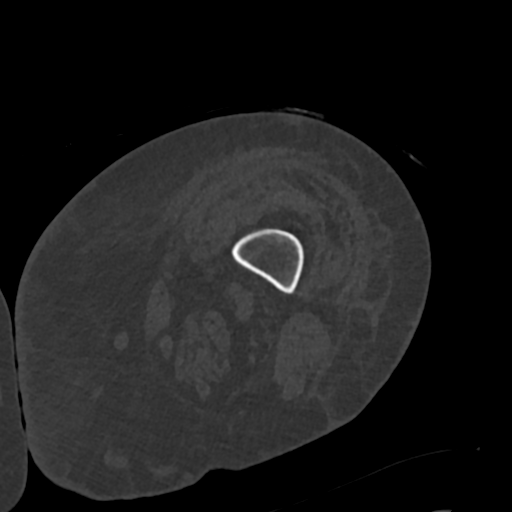
[im 79/255  bone]
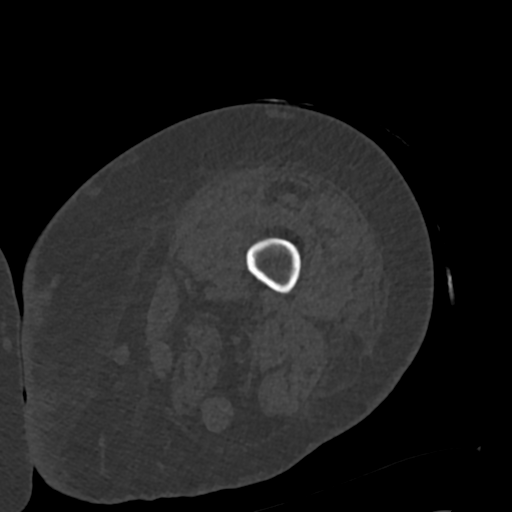
[im 118/255  bone]
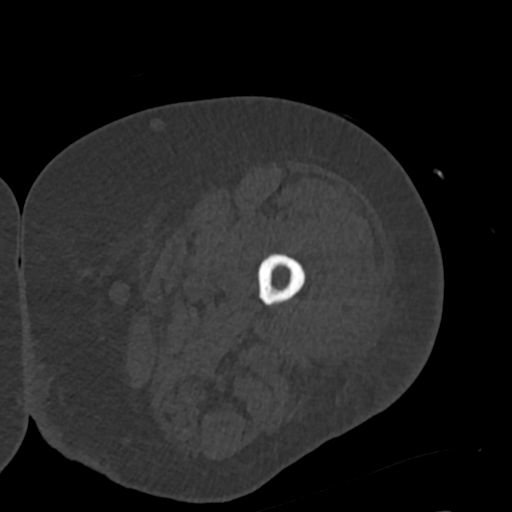
[im 137/255  soft-tissue]
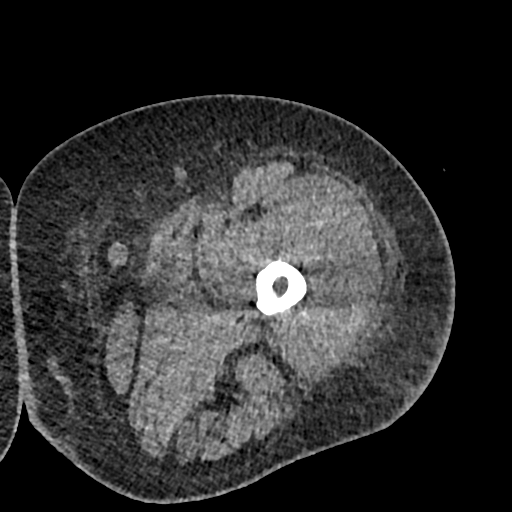
[im 137/255  bone]
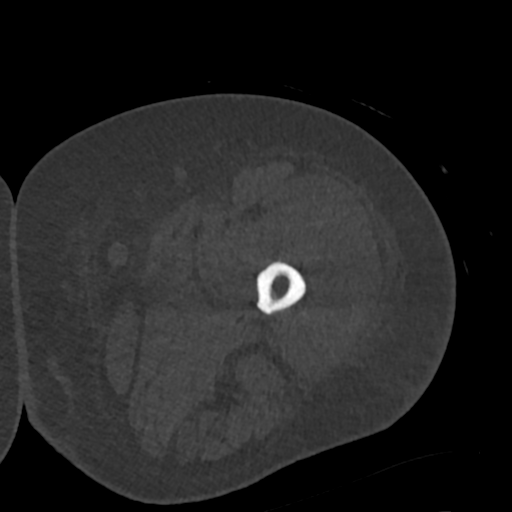
[im 176/255  bone]
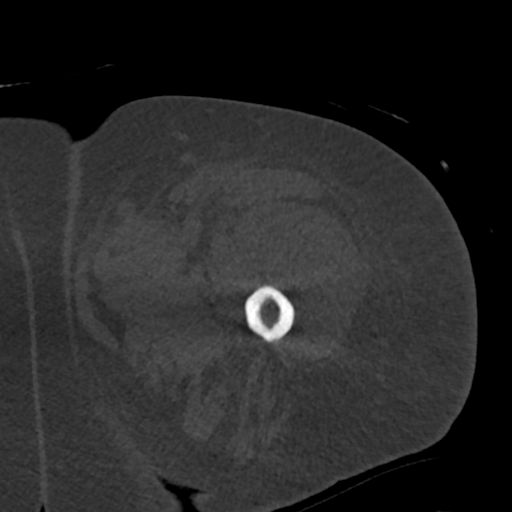
[im 196/255  bone]
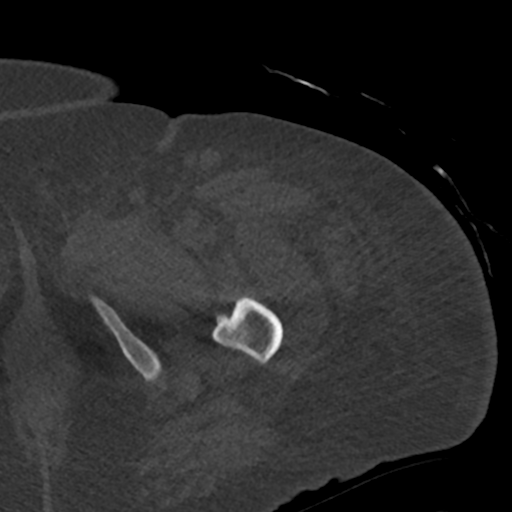
[im 235/255  bone]
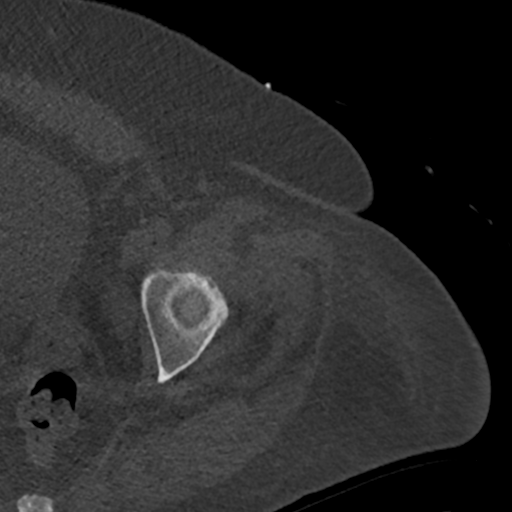

[Series 7: cor bone · coronal · 0.56mm/px · 1 of 152 slices shown]
[im 76/152  bone]
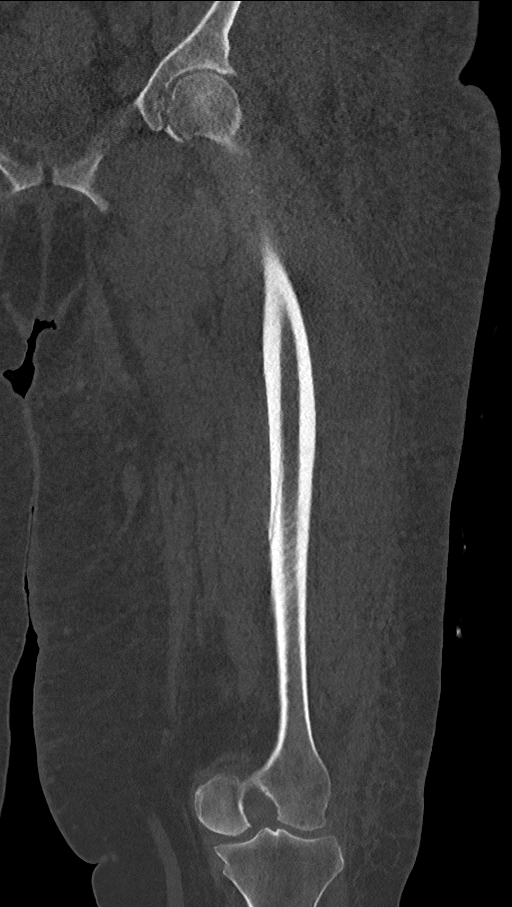

[Series 8: sag bone · sagittal · 0.65mm/px · 5 of 136 slices shown]
[im 23/136  bone]
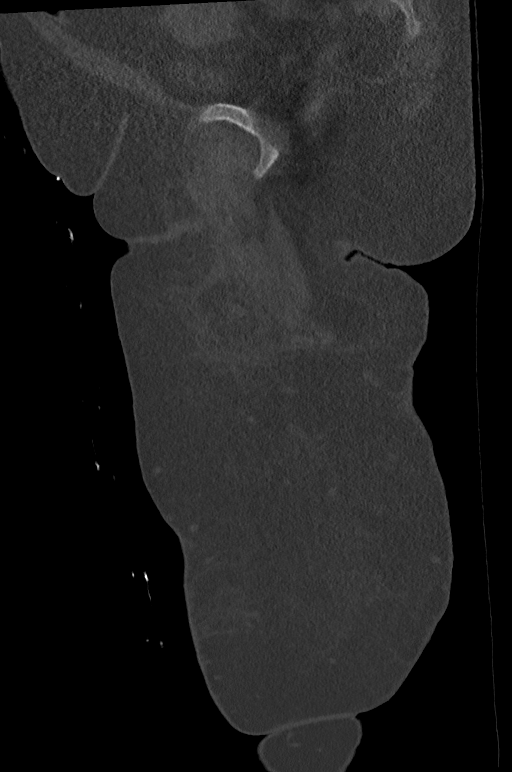
[im 46/136  bone]
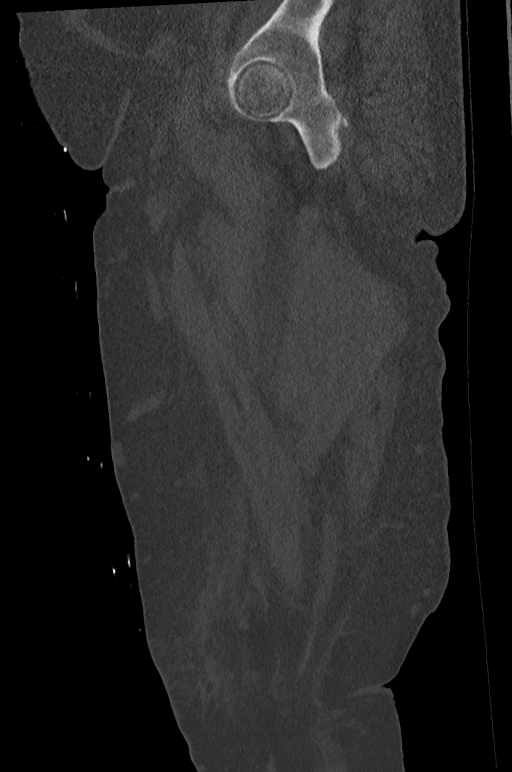
[im 68/136  bone]
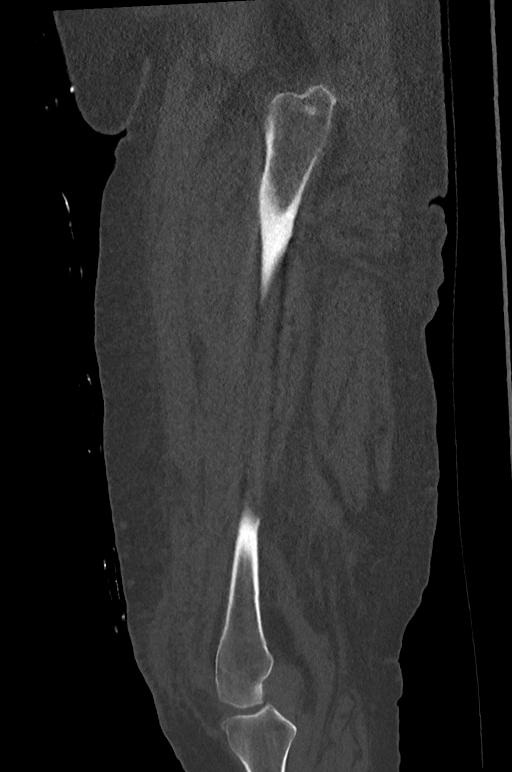
[im 91/136  bone]
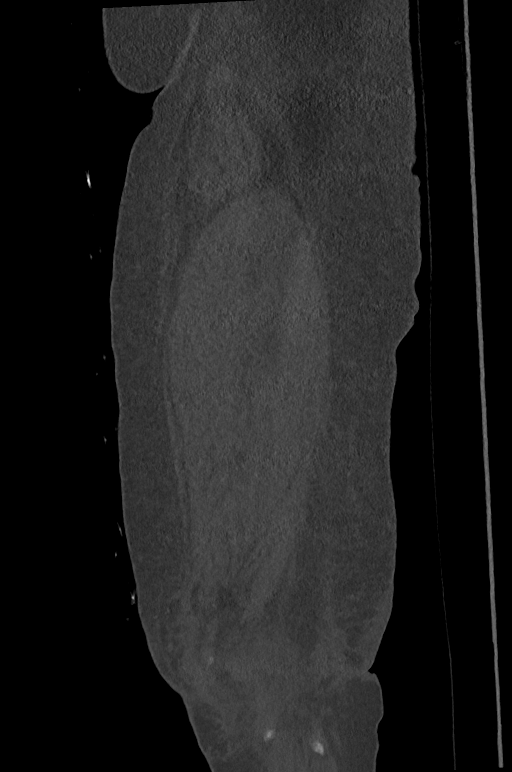
[im 113/136  bone]
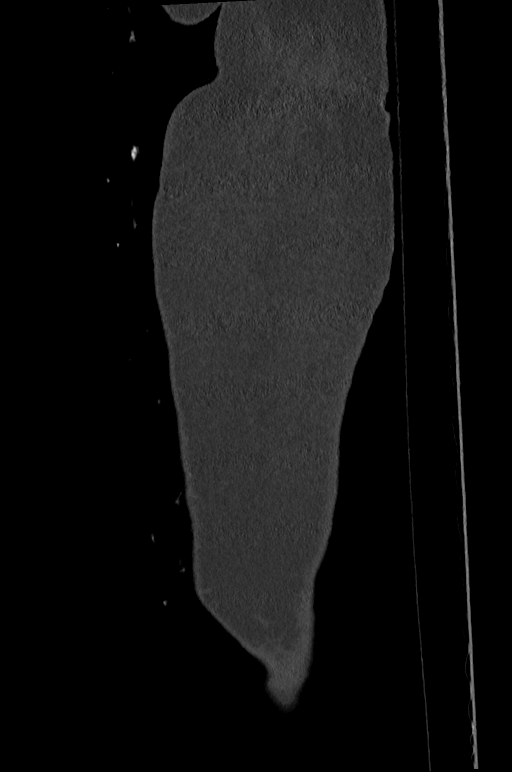

[14 of 33 positions shown; findings below may reference images not displayed]

FINDINGS: Bones/Joint/Cartilage

No fracture or dislocation. Mild left hip and knee osteoarthritis.
No joint effusion.

Ligaments

Ligaments are suboptimally evaluated by CT.

Muscles and Tendons
There is enlargement and loss of the normal internal architecture
involving the proximal vastus lateralis muscle, with a few areas of
ill-defined hypodensity. No measurable fluid collection or hematoma.

Soft tissue
No soft tissue mass. Unchanged prepatellar soft tissue swelling. IUD
in the endometrial canal.
IMPRESSION: 1. Enlargement and loss of the normal internal architecture
involving the proximal vastus lateralis muscle, with a few areas of
ill-defined hypodensity. Findings are suspicious for intramuscular
hematoma.
2. No acute osseous abnormality.

## 2021-03-28 IMAGING — MR MR HEAD W/O CM
6 of 10 series · 30 of 48 positions shown · non-contrast
Comparison: [DATE]

CLINICAL DATA: Dizziness, persistent/recurrent, cardiac or vascular
cause suspected

EXAM:
MRI HEAD WITHOUT CONTRAST
TECHNIQUE: Multiplanar, multiecho pulse sequences of the brain and surrounding
structures were obtained without intravenous contrast.

[Series 3: DWI · axial · 3.0mm · 0.94mm/px · z∈[-104,+60]mm · 11 of 112 slices shown (1 of 2)]
[im 1/112]
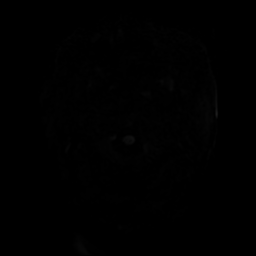
[im 12/112]
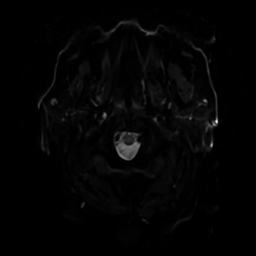
[im 23/112]
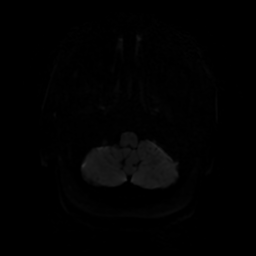
[im 34/112]
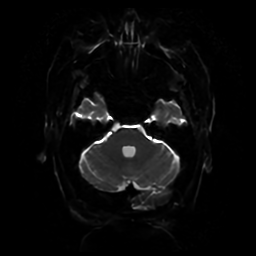
[im 45/112]
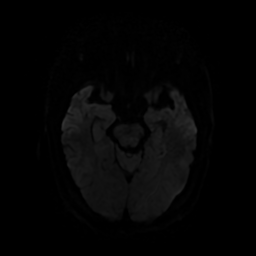
[im 56/112]
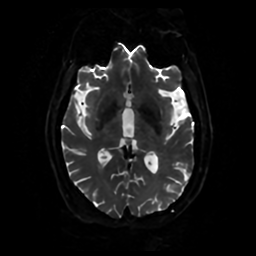
[im 67/112]
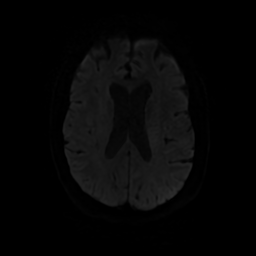
[im 78/112]
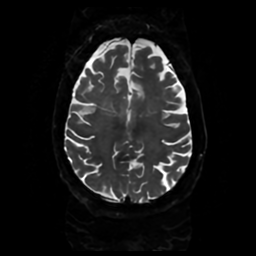
[im 89/112]
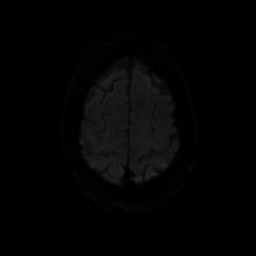
[im 100/112]
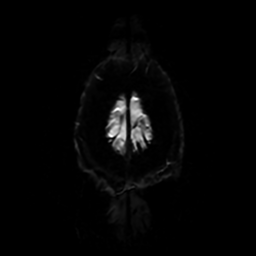
[im 112/112]
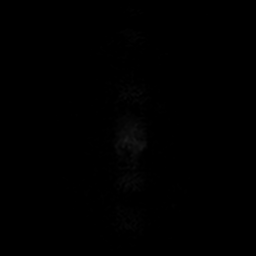

[Series 4: DWI · coronal · 4.0mm · 0.94mm/px · 6 of 74 slices shown (2 of 2)]
[im 1/74]
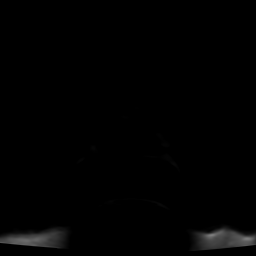
[im 15/74]
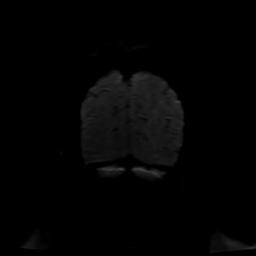
[im 30/74]
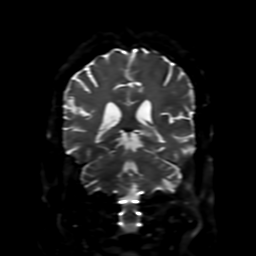
[im 44/74]
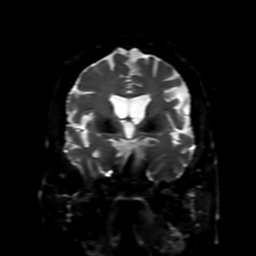
[im 59/74]
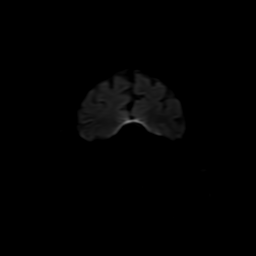
[im 74/74]
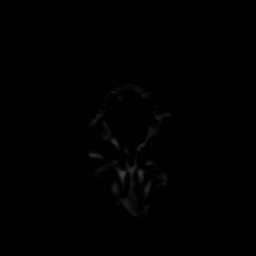

[Series 5: FLAIR · sagittal · 5.0mm · 0.23mm/px · 2 of 27 slices shown (1 of 2)]
[im 1/27]
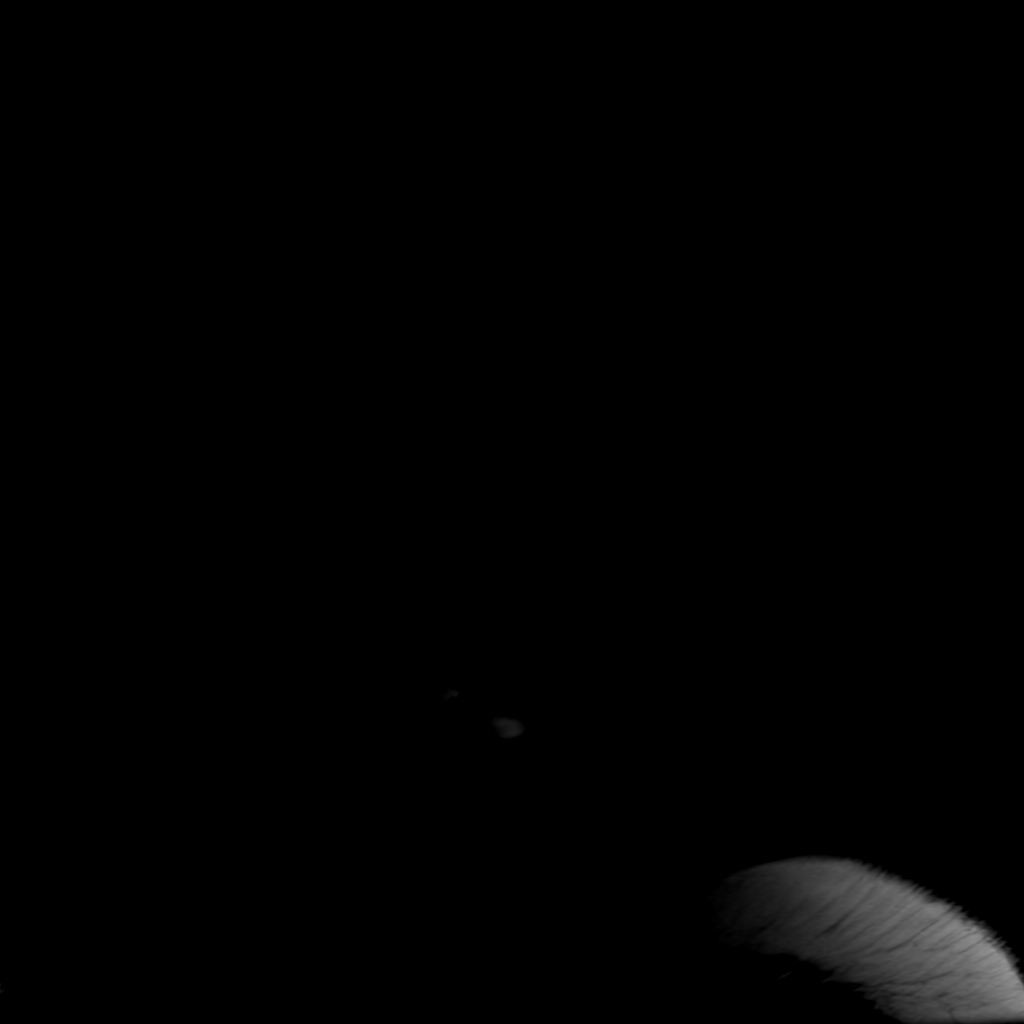
[im 27/27]
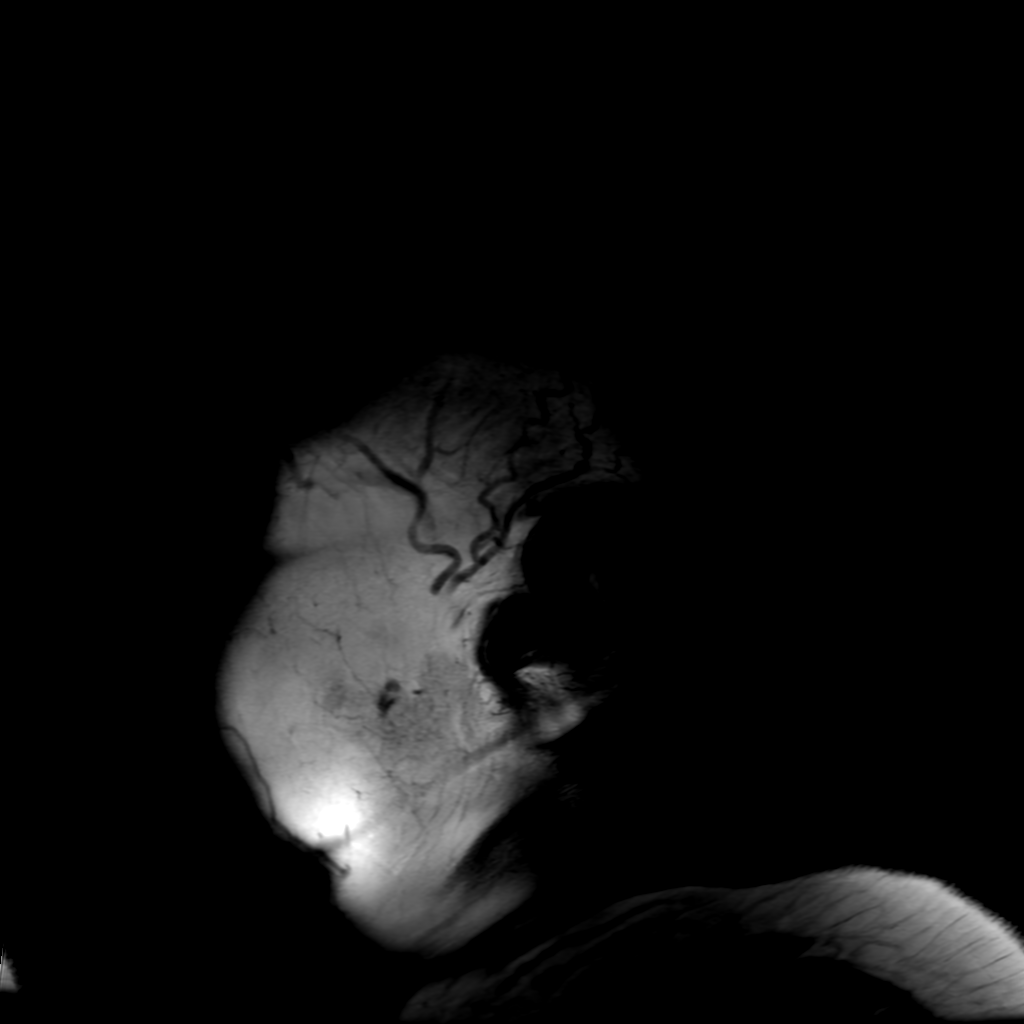

[Series 7: FLAIR · axial · 4.0mm · 0.45mm/px · z∈[-102,+52]mm · 3 of 36 slices shown (2 of 2)]
[im 1/36]
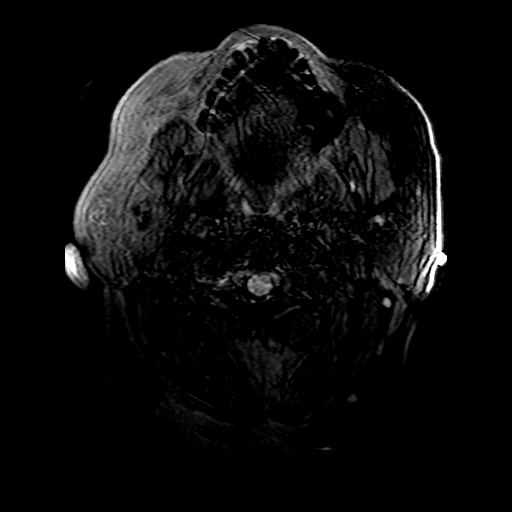
[im 18/36]
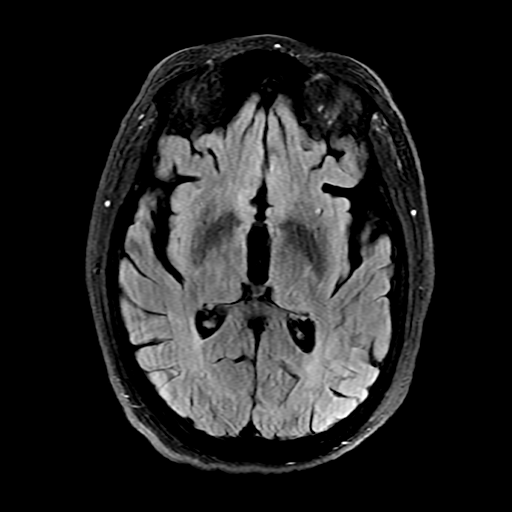
[im 36/36]
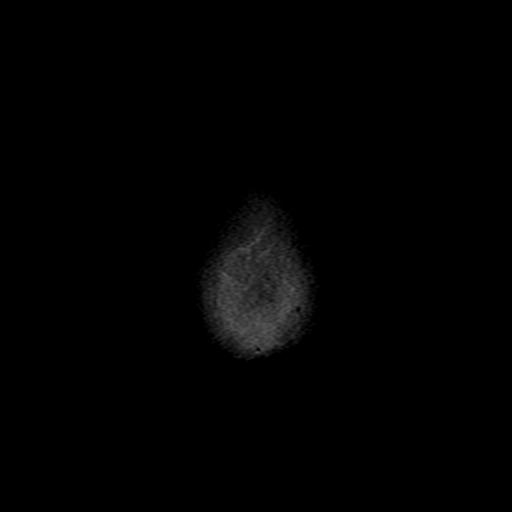

[Series 350: ADC · axial · 3.0mm · 0.94mm/px · z∈[-104,+60]mm · 5 of 56 slices shown (1 of 2)]
[im 1/56]
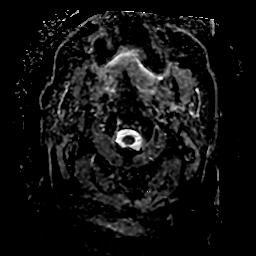
[im 14/56]
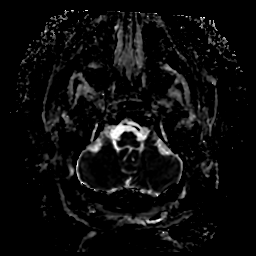
[im 28/56]
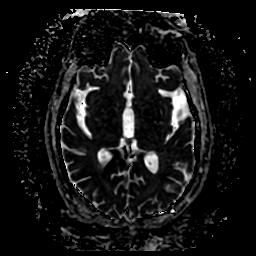
[im 42/56]
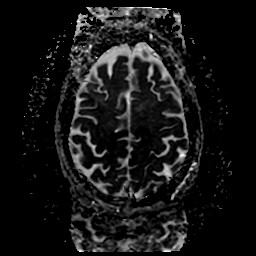
[im 56/56]
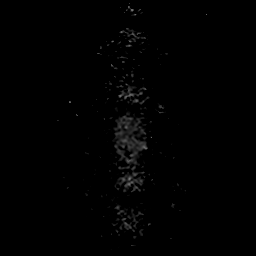

[Series 450: ADC · coronal · 4.0mm · 0.94mm/px · 3 of 38 slices shown (2 of 2)]
[im 1/38]
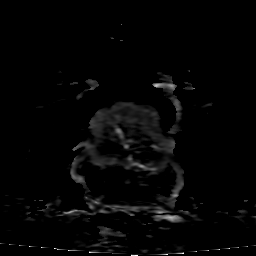
[im 19/38]
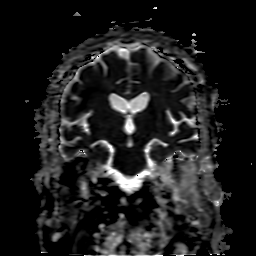
[im 38/38]
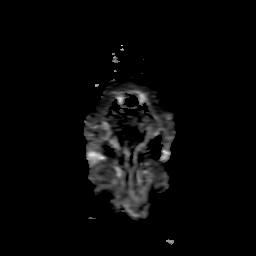

[30 of 48 positions shown; findings below may reference images not displayed]

FINDINGS: Brain: No acute infarct, mass effect or extra-axial collection. No
acute or chronic hemorrhage. There is multifocal hyperintense
T2-weighted signal within the white matter. Generalized volume loss
without a clear lobar predilection. The midline structures are
normal.

Vascular: Major flow voids are preserved.

Skull and upper cervical spine: Normal calvarium and skull base.
Visualized upper cervical spine and soft tissues are normal.

Sinuses/Orbits:No paranasal sinus fluid levels or advanced mucosal
thickening. No mastoid or middle ear effusion. Normal orbits.
IMPRESSION: 1. No acute intracranial abnormality.
2. Generalized volume loss and findings of chronic microvascular
ischemia.

## 2021-03-28 MED ORDER — WARFARIN 1.25 MG HALF TABLET
1.2500 mg | ORAL_TABLET | Freq: Once | ORAL | Status: AC
  Administered 2021-03-28: 1.25 mg via ORAL
  Filled 2021-03-28: qty 1

## 2021-03-28 MED ORDER — SODIUM CHLORIDE 0.9% IV SOLUTION: Freq: Once | INTRAVENOUS | Status: DC

## 2021-03-28 NOTE — Progress Notes (Signed)
Occupational Therapy Treatment Patient Details Name: [name] MRN: [id number] DOB: 05/12/1956 Today's Date: 03/28/2021    History of present illness 65 y.o. female presenting to ED 7/14 with presyncopal episodes and recurrent falls x3 in 2 days. Patient admitted with presyncope/weakness secondary to hypotension and AKI. Labs (+) supratherapeutic INR w/hx of aortic valve repair. PMHx significant for aortic stenosis s/p TAVR on warfarin, chronic diastolic CHF, HTN, TIA, OSA on BiPAP.   OT comments  Patient supine in bed and agreeable to OT.  Pt completing transfers using rollator with min guard to [facility], min assist for toileting hygiene today (increased time for clothing mgmt) and max assist for LB ADLs (spouse assists at baseline though).  Patient BP stable with positional changes, but HR elevated to 140s with ADL tasks.  Limited by pain in R LE and fatigues easily.  Continue per POC and updated dc plan to [facility] at this time.  Will follow acutely.    Follow Up Recommendations  Home health OT;Supervision/Assistance - 24 hour    Equipment Recommendations  3 in 1 bedside commode    Recommendations for Other Services      Precautions / Restrictions Precautions Precautions: Fall Precaution Comments: watch BP, HR Restrictions Weight Bearing Restrictions: No       Mobility Bed Mobility Overal bed mobility: Needs Assistance Bed Mobility: Supine to Sit     Supine to sit: Supervision;HOB elevated     General bed mobility comments: increased time but no physical assist required    Transfers Overall transfer level: Needs assistance Equipment used: 4-wheeled walker Transfers: Sit to/from Stand Sit to Stand: Min guard         General transfer comment: min guard to steady, good recall of hand placement    Balance Overall balance assessment: Needs assistance Sitting-balance support: No upper extremity supported Sitting balance-Leahy Scale: Good     Standing balance support:  Bilateral upper extremity supported;During functional activity;No upper extremity supported Standing balance-Leahy Scale: Fair Standing balance comment: relies on BUE support dynaimcally but able to engage in ADLs with 0-1 UE support                           ADL either performed or assessed with clinical judgement   ADL Overall ADL's : Needs assistance/impaired                     Lower Body Dressing: Maximal assistance;Sit to/from stand Lower Body Dressing Details (indicate cue type and reason): assist to don socks, thread underwear but pt able to pull up over hips with min guard; pt reports spouse assists at baseline Toilet Transfer: Min Statistician Details (indicate cue type and reason): used rollator for pivot to [facility] with min guard Toileting- Clothing Manipulation and Hygiene: Minimal assistance;Sit to/from stand Toileting - Clothing Manipulation Details (indicate cue type and reason): reports "i do the front and they help me with the back", encouragement to complete without assist and only requiring min assist for posteior hygiene and increased time to manage underwear over hips     Functional mobility during ADLs: Min guard (rollator) General ADL Comments: pt fatigues easily, BP stable during session but HR up to 140s with ADL tasks in standing     Vision       Perception     Praxis      Cognition Arousal/Alertness: Awake/alert Behavior During Therapy: WFL for tasks assessed/performed Overall Cognitive Status: Within Functional  Limits for tasks assessed                                          Exercises     Shoulder Instructions       General Comments BP stable during session but HR tachy up to 140s during ADL tasks (resting HR 96)    Pertinent Vitals/ Pain       Pain Assessment: Faces Faces Pain Scale: Hurts little more Pain Location: L thigh Pain Descriptors / Indicators: Aching;Sore Pain  Intervention(s): Limited activity within patient's tolerance;Monitored during session;Repositioned  Home Living                                          Prior Functioning/Environment              Frequency  Min 2X/week        Progress Toward Goals  OT Goals(current goals can now be found in the care plan section)  Progress towards OT goals: Progressing toward goals  Acute Rehab OT Goals Patient Stated Goal: manage pain OT Goal Formulation: With patient  Plan Discharge plan remains appropriate;Frequency remains appropriate    Co-evaluation                 AM-PAC OT "6 Clicks" Daily Activity     Outcome Measure   Help from another person eating meals?: None Help from another person taking care of personal grooming?: A Little Help from another person toileting, which includes using toliet, bedpan, or urinal?: A Little Help from another person bathing (including washing, rinsing, drying)?: A Lot Help from another person to put on and taking off regular upper body clothing?: A Little Help from another person to put on and taking off regular lower body clothing?: A Lot 6 Click Score: 17    End of Session Equipment Utilized During Treatment: Other (comment) (rollator)  OT Visit Diagnosis: Unsteadiness on feet (R26.81);Muscle weakness (generalized) (M62.81);Repeated falls (R29.6);Pain Pain - Right/Left: Left Pain - part of body: Leg   Activity Tolerance Patient tolerated treatment well   Patient Left in chair;with call bell/phone within reach;with chair alarm set   Nurse Communication Mobility status        Time: 8875-7972 OT Time Calculation (min): 36 min  Charges: OT General Charges $OT Visit: 1 Visit OT Treatments $Self Care/Home Management : 23-37 mins  [place] Pager [phone] Office [phone]    Delight Stare 03/28/2021, 10:33 AM

## 2021-03-28 NOTE — Progress Notes (Signed)
Pt. Refused bipap. RT informed pt. To notify if she changes her mind. 

## 2021-03-28 NOTE — Progress Notes (Signed)
PROGRESS NOTE    [name]  JJO:[id number] DOB: Apr 05, 1956 DOA: 03/22/2021 PCP: [name], MD   Brief Narrative:  The patient is a morbidly obese 64 year old Caucasian female with a past medical history significant for but not limited to aortic stenosis status post TAVR currently on anticoagulation with warfarin, chronic diastolic CHF, hypertension, history of TIA, history of obstructive sleep apnea on BiPAP as well as other comorbidities who presented to the hospital with presyncopal episodes.  She reports that for the last 2 to 3 days she has had falls 3 different times.  She states the falls happen when she is walking across the room and states that her "head feels funny" and states that her immediately feels like her legs give out from underneath her.  She tried using her Rollator yesterday to support her but had a third fall with similar symptoms.  She denies any room spinning sensation.  No loss of consciousness.  She did not hit her head.  She does take torsemide and states last week she had urinary symptoms that she lost about 6 pounds.  She was also noted to have a supratherapeutic INR of 7.8 at her PCP office on 03/13/2021 and she was asked to hold her Coumadin for several days and had repeat on 7/12 which was still elevated at 7.8.  For last few days she has noticed some nosebleeds but denies any blood per rectum or any dark stools.  Approximately 3 weeks ago she was started on bupropion for depression.  In the ED she was noted to be hypotensive down to the 90s over 60s and had an elevated WBC of 20.3.  Creatinine was elevated from her baseline of 0.5 and she was admitted for an AKI.  Given her fall she had a head CT and was negative.  She was admitted for presyncope and weakness in the setting of hypotension and dehydration likely suspected to overdiuresis with torsemide as well as for AKI.   AKI is improving with IVF Hydration and PT/OT recommending Home Health. Patient was  Orthostatic yesterday so she was given her another 1 Liter of IVF and TED hose and Maintenance IVF renewed but at 50 mL/hr. Repeat Orthostatic VS pending to be done. INR trending down and is now 3.7. She had an X-Ray of her Thigh and Hip yesterday due to Pain and it showed "Normal alignment. No fracture or dislocation. Mild left hip degenerative arthritis. Soft tissues are unremarkable." Because she continues to have a bruise and a knot on the Left thigh will obtain a Soft Tissue U/S to assess for Hematoma. Soft Tissue U/S showed  "Focused ultrasound of the area of concern along the left lateral thigh demonstrates soft tissue swelling without discrete fluid collection or hematoma. No soft tissue mass." Patient's Hgb/Hct continue to Drop so will check FOBT and a CT Scan of the Knee and a CT Abd/Pelvis w/o Contrast to evaluate for Retroperitoneal Hematoma.   CT scan of the abdomen pelvis showed suggestion of a trace volume of left retroperitoneal hemorrhage and the left proximal thigh superficial deep subcutaneous soft tissue edema with asymmetric enlarged left proximal thigh musculature compared to the right with possible underlying intramuscular hematoma not fully excluded.  There is no definitive organized fluid collection with limited evaluation on the noncontrasted study.  Patient did have a left pelvic sidewall lymphadenopathy as well as trace left pleural effusion and large bilateral simple renal cysts and aortic atherosclerosis.  Her CT scan of the knee  left knee was negative for mass or fluid collection but there was infiltration of the subcutaneous fat around the anterior aspect of the knee that could be due to soft tissue contusion given the patient's history of fall.  There is negative bony and acute joint abnormalities.  Her hemoglobin dropped further yesterday but improving repeat looks like it stabilized so we will give her a low-dose of Coumadin to see how she responds to this and started at 2 mg  tonight.  Assessment & Plan:   Principal Problem:   Hypotension Active Problems:   H/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   Sleep apnea   Chronic diastolic CHF (congestive heart failure) (HCC)   Weakness   Depression   AKI (acute kidney injury) ([place])   Supratherapeutic INR  Presyncope/weakness secondary to hypotension in the setting Of Orthostatic Hypotension -Suspect that patient is over diuresed with her torsemide causing her to be volume depleted and hypotensive -Has received 1.5 L normal saline fluid in the ED. Give an additional 1 Liter bolus the day before yesterday and will give a 500 mL bolus today given her continued orthostatics; Continue Maintenance IVF at 75 mL/hr but timed out and renewed at 50 mL/hr of LR overnight. -Added TED Hose -Hold antihypertensives -Check orthostatic vital signs; she still remains on the softer side with her blood pressure so we will continue to hold her antihypertensives.  Patient was still orthostatic as her blood pressure dropped from 140/76 down to 86/60 and then standing went up to 96/51 -She is given a 500 mL Bolus yesterday and today given continued Orthostatic Hypotension  -Obtain PT and OT to further evaluate and treat and recommending Home Health when Stable for D/C  -Will ensure TED hose worn and may need an abdominal binder   AKI, improving  -Suspect due to overdiuresis with torsemide -Baseline creatinine is around 0.8; she presented with a BUNs/creatinine 35/2.01 and trending down 34/1.73 -> 28/1.43 -> 14/0.95 -> 11/1.02 and is now less than 5/0.75 -Avoid nephrotoxic medications, contrast dyes, hypotension and renally adjust medications -Follow-up with repeat labs after IV fluid resuscitation; IVF now stopped but will give a 500 mL Bolus again -Continue monitor and trend renal function carefully repeat CMP in a.m.  Hypokalemia -Replaced -Continue monitor and replete as necessary   Supratherapeutic INR w/hx of aortic  stenosis status post AVR  -Patient is on chronic anticoagulation due to concerns for thrombosis on aortic valve leaflets -INR 6.4 on admission, but has trended down to 1.8 -Suspect could be due to her AKI.  Only new medication was bupropion 2 weeks ago which is unlikely to cause. -Has had 2 days of nosebleed but no other significant findings of bleeding.  Hemoglobin is dropping and CT scan as above -CT scan of the abdomen pelvis showed suggestion of a trace volume of left retroperitoneal hemorrhage and the left proximal thigh superficial deep subcutaneous soft tissue edema with asymmetric enlarged left proximal thigh musculature compared to the right with possible underlying intramuscular hematoma not fully excluded.  There is no definitive organized fluid collection with limited evaluation on the noncontrasted study.  Patient did have a left pelvic sidewall lymphadenopathy as well as trace left pleural effusion and large bilateral simple renal cysts and aortic atherosclerosis.  Her CT scan of the knee left knee was negative for mass or fluid collection but there was infiltration of the subcutaneous fat around the anterior aspect of the knee that could be due to soft tissue contusion given the  patient's history of fall.  There is negative bony and acute joint abnormalities. -Also check FOBT -Check INR daily -Follow response to resuming her Coumadin   Chronic Diastolic heart failure -Continue to Monitor closely while receiving fluids -Currently appears dry and family thinks that her legs are looking the best they have usually been and this is usually what they are normally -Strict I's and O's and daily weights -Continue to monitor for signs and symptoms of volume overload  Anemia -Patient has had a consistent downtrending hemoglobin since admission -To some degree, this may be related to hemodilution -She does also have some iron deficiency -She has extensive bruising around her left thigh status  post fall -She was on anticoagulation prior to admission -Suspect that she has developed significant hematoma -Hemoglobin has trended down to 7.3 today and she is still having symptoms of weakness and lightheadedness -We will transfuse 1 unit of PRBC today  Left Thigh Pain and Bruise -Appears to have extensive bruising over left knee and around left hamstring -Area is warm to touch -Imaging thus far including ultrasound and CT of her knee did not show any obvious hematoma -We will check CT of her femur -Pain Control and K Pad  Room tilt illusion -Describes feeling that the room is tilted downwards, denies any vertigo symptoms -Denies any diplopia -Since she is on anticoagulation, will check MRI brain to rule out any underlying pathology -If MRI is negative, agree with further evaluation for vestibular dysfunction   OSA -Continue BiPAP   Depression -Continue Bupropion and Cymbalta   Hyperlipidemia -Continue with Pravastatin 40 mg p.o. daily   Leukocytosis, improved -Unclear etiology but likely in the setting of hemoconcentration from dehydration from overdiuresis -Initially improved with IV fluids -No fever at this time -Continue to monitor -If persistently remains elevated we will do an infectious work-up and obtain a chest x-ray, urinalysis with urine culture as well as blood cultures -Continue to monitor off of Antibiotics and trend and repeat CBC  Morbid Obesity -Complicates overall prognosis and care -Estimated body mass index is 48.4 kg/m as calculated from the following:   Height as of this encounter: 5\' 1"  (1.549 m).   Weight as of this encounter: 116.2 kg. -Weight Loss and Dietary Counseling given   DVT prophylaxis: Holding Coumadin but now will resume given that her INR now 2.1 Code Status: FULL CODE Family Communication: Discussed with the husband at bedside Disposition Plan: Pending further clinical improvement and evaluation by PT/OT   Status is:  Inpatient  Remains inpatient appropriate because:Unsafe d/c plan, IV treatments appropriate due to intensity of illness or inability to take PO, and Inpatient level of care appropriate due to severity of illness  Dispo: The patient is from: Home              Anticipated d/c is to: Home              Patient currently is not medically stable to d/c.   Difficult to place patient No  Consultants:  None  Procedures: None  Antimicrobials:  Anti-infectives (From admission, onward)    None        Subjective: Still has some dizziness and weakness when she works with physical therapy.  Describes pain in her left leg while working with therapy.  Reports that she has been having intermittent episodes of feeling that the room is tilted downwards.  Denies any double vision.  Symptoms last for a few seconds.  She does not feel they are  related to head movements.  Objective: Vitals:   03/28/21 0711 03/28/21 1145 03/28/21 1606 03/28/21 1611  BP: (!) 132/55 134/62 (!) 113/54 130/64  Pulse: 94 100 92   Resp: 20 16 19    Temp:  99 F (37.2 C) 98 F (36.7 C)   TempSrc:  Oral Oral   SpO2:   94%   Weight:      Height:        Intake/Output Summary (Last 24 hours) at 03/28/2021 1911 Last data filed at 03/28/2021 0400 Gross per 24 hour  Intake 120 ml  Output 650 ml  Net -530 ml   Filed Weights   03/26/21 0614 03/27/21 0300 03/28/21 0418  Weight: 117.2 kg 117.5 kg 116.2 kg   Examination: Physical Exam:  General exam: Alert, awake, oriented x 3 Respiratory system: Clear to auscultation. Respiratory effort normal. Cardiovascular system:RRR. No murmurs, rubs, gallops. Gastrointestinal system: Abdomen is nondistended, soft and nontender. No organomegaly or masses felt. Normal bowel sounds heard. Central nervous system: Alert and oriented. No focal neurological deficits. Extremities: extensive bruising noted on superior aspect of left knee as well as posterior left thigh.  Skin is warm to  touch Skin: No rashes, lesions or ulcers Psychiatry: Judgement and insight appear normal. Mood & affect appropriate.    Data Reviewed: I have personally reviewed following labs and imaging studies  CBC: Recent Labs  Lab 03/25/21 0014 03/26/21 0001 03/27/21 0020 03/27/21 1505 03/28/21 0051  WBC 9.1 9.0 10.1 9.7 12.0*  NEUTROABS 5.7 5.6 6.5 6.8 8.1*  HGB 9.5* 7.9* 7.5* 7.7* 7.3*  HCT 29.9* 25.0* 23.9* 24.4* 23.3*  MCV 87.2 89.6 89.5 88.7 89.3  PLT 159 178 201 200 384   Basic Metabolic Panel: Recent Labs  Lab 03/24/21 0015 03/25/21 0014 03/26/21 0001 03/27/21 0020 03/28/21 0051  NA 134* 136 136 137 135  K 3.2* 3.7 3.8 4.0 4.1  CL 98 104 103 105 105  CO2 29 27 26 29 26   GLUCOSE 117* 106* 116* 94 115*  BUN 28* 14 11 <5* 6*  CREATININE 1.43* 0.95 1.02* 0.75 0.76  CALCIUM 9.2 9.0 8.9 9.0 9.0  MG 2.0 2.0 2.0 2.1 2.1  PHOS 2.6 2.6 2.8 2.7 3.1   GFR: Estimated Creatinine Clearance: 82.1 mL/min (by C-G formula based on SCr of 0.76 mg/dL). Liver Function Tests: Recent Labs  Lab 03/24/21 0015 03/25/21 0014 03/26/21 0001 03/27/21 0020 03/28/21 0051  AST 25 27 28  40 30  ALT 15 15 17 20 19   ALKPHOS 49 41 39 50 51  BILITOT 0.4 0.7 0.8 1.1 1.4*  PROT 5.9* 5.7* 5.3* 5.2* 5.8*  ALBUMIN 2.9* 2.7* 2.5* 2.3* 2.5*   Recent Labs  Lab 03/22/21 1932  LIPASE 24   No results for input(s): AMMONIA in the last 168 hours. Coagulation Profile: Recent Labs  Lab 03/24/21 0749 03/25/21 0014 03/26/21 0001 03/27/21 0020 03/28/21 0051  INR 4.2* 3.7* 2.5* 2.1* 1.8*   Cardiac Enzymes: No results for input(s): CKTOTAL, CKMB, CKMBINDEX, TROPONINI in the last 168 hours. BNP (last 3 results) Recent Labs    06/06/20 0903  PROBNP 1,079*   HbA1C: No results for input(s): HGBA1C in the last 72 hours. CBG: Recent Labs  Lab 03/24/21 0612 03/25/21 0620 03/26/21 0617 03/27/21 0534 03/28/21 0624  GLUCAP 108* 103* 96 102* 96   Lipid Profile: No results for input(s): CHOL, HDL,  LDLCALC, TRIG, CHOLHDL, LDLDIRECT in the last 72 hours. Thyroid Function Tests: No results for input(s): TSH, T4TOTAL, FREET4, T3FREE, THYROIDAB  in the last 72 hours.  Anemia Panel: Recent Labs    03/26/21 0859  VITAMINB12 263  FOLATE 5.7*  FERRITIN 95  TIBC 227*  IRON 30  RETICCTPCT 2.8   Sepsis Labs: No results for input(s): PROCALCITON, LATICACIDVEN in the last 168 hours.  Recent Results (from the past 240 hour(s))  Urine Culture     Status: Abnormal   Collection Time: 03/22/21  6:56 PM   Specimen: Urine, Clean Catch  Result Value Ref Range Status   Specimen Description URINE, CLEAN CATCH  Final   Special Requests NONE  Final   Culture (A)  Final    10,000 COLONIES/mL LACTOBACILLUS SPECIES Standardized susceptibility testing for this organism is not available. Performed at [facility], [place] [address] [place], [place] [address]    Report Status 03/24/2021 FINAL  Final  SARS CORONAVIRUS 2 (TAT 6-24 HRS) Nasopharyngeal Nasopharyngeal Swab     Status: None   Collection Time: 03/22/21  9:55 PM   Specimen: Nasopharyngeal Swab  Result Value Ref Range Status   SARS Coronavirus 2 NEGATIVE NEGATIVE Final    Comment: (NOTE) SARS-CoV-2 target nucleic acids are NOT DETECTED.  The SARS-CoV-2 RNA is generally detectable in upper and lower respiratory specimens during the acute phase of infection. Negative results do not preclude SARS-CoV-2 infection, do not rule out co-infections with other pathogens, and should not be used as the sole basis for treatment or other patient management decisions. Negative results must be combined with clinical observations, patient history, and epidemiological information. The expected result is Negative.  Fact Sheet for Patients: SugarRoll.be  Fact Sheet for Healthcare Providers: https://www.woods-mathews.com/  This test is not yet approved or cleared by the [place] FDA and  has been  authorized for detection and/or diagnosis of SARS-CoV-2 by FDA under an Emergency Use Authorization (EUA). This EUA will remain  in effect (meaning this test can be used) for the duration of the COVID-19 declaration under Se ction 564(b)(1) of the Act, 21 U.S.C. section 360bbb-3(b)(1), unless the authorization is terminated or revoked sooner.  Performed at [facility], [place] [address] [place], [place] [address]   MRSA Next Gen by PCR, Nasal     Status: Abnormal   Collection Time: 03/23/21  1:32 AM   Specimen: Nasal Mucosa; Nasal Swab  Result Value Ref Range Status   MRSA by PCR Next Gen DETECTED (A) NOT DETECTED Final    Comment: RESULT CALLED TO, READ BACK BY AND VERIFIED WITH: A. TEPE,RN 0430 03/23/2021 T. TYSOR (NOTE) The GeneXpert MRSA Assay (FDA approved for NASAL specimens only), is one component of a comprehensive MRSA colonization surveillance program. It is not intended to diagnose MRSA infection nor to guide or monitor treatment for MRSA infections. Test performance is not FDA approved in patients less than 86 years old. Performed at [facility], [place] [address] [place], [place] [address]     RN Pressure Injury Documentation:     Estimated body mass index is 48.4 kg/m as calculated from the following:   Height as of this encounter: 5\' 1"  (1.549 m).   Weight as of this encounter: 116.2 kg.  Malnutrition Type:  Malnutrition Characteristics:  Nutrition Interventions:  Radiology Studies: No results found.  Scheduled Meds:  sodium chloride   Intravenous Once   buPROPion  150 mg Oral Daily   diclofenac Sodium  2 g Topical QID   DULoxetine  60 mg Oral Daily   folic acid  1 mg Oral  Daily   oxybutynin  5 mg Oral QPM   polyethylene glycol  17 g Oral BID   pravastatin  40 mg Oral q1800   senna-docusate  1 tablet Oral BID   sodium chloride flush  3 mL Intravenous Q12H   vitamin B-12  1,000 mcg Oral Daily   Warfarin - Pharmacist Dosing Inpatient    Does not apply q1600   Continuous Infusions:   LOS: 5 days   Kathie Dike, MD [facility] PAGER is on [place]  If 7PM-7AM, please contact night-coverage [email or web address]

## 2021-03-28 NOTE — Progress Notes (Signed)
ANTICOAGULATION CONSULT NOTE  Pharmacy Consult for warfarin Indication: atrial fibrillation  Allergies  Allergen Reactions   Vancomycin Itching    Patient can receive vancomycin with slower infusion and prn benadryl for itching     Patient Measurements: Height: 5\' 1"  (154.9 cm) Weight: 116.2 kg (256 lb 2.8 oz) IBW/kg (Calculated) : 47.8   Vital Signs: Temp: 98.6 F (37 C) (07/20 0334) Temp Source: Oral (07/20 0334) BP: 132/55 (07/20 0711) Pulse Rate: 94 (07/20 0711)  Labs: Recent Labs    03/26/21 0001 03/27/21 0020 03/27/21 1505 03/28/21 0051  HGB 7.9* 7.5* 7.7* 7.3*  HCT 25.0* 23.9* 24.4* 23.3*  PLT 178 201 200 225  LABPROT 27.2* 23.7*  --  21.2*  INR 2.5* 2.1*  --  1.8*  CREATININE 1.02* 0.75  --  0.76     Estimated Creatinine Clearance: 82.1 mL/min (by C-G formula based on SCr of 0.76 mg/dL).   Medical History: Past Medical History:  Diagnosis Date   Anemia 06/03/2019   Aortic stenosis 03/23/2013   Overview:  02/18/13 TTE EF >55%. Critical AS with mean Ao valve gradient of 82 mm Hg. No AI. No MR, PR, mild TR. Estimated RVSP 30 mm Hg.   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 Postmenopausal bleeding since for the past 2 years.  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 Last biopsy was in May of 2019 which showed weakly proliferative endometrium possibly a polyp.     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      Assessment: 65yof with HX bAVR/TIA (hx valve thrombosis) on warfarin pta 1.25mg  TTSS and 2.5mg  MWF. Admit INR >6, no reversal given. CT with trace L RP hemorrhage.   INR 1.8 after restarting low dose yesterday.  Goal of Therapy:  INR 2-3 Monitor platelets by anticoagulation protocol: Yes   Plan:  Warfarin 1.25mg  x1 Daily INR  Arrie Senate, PharmD, BCPS, [facility] Clinical Pharmacist [phone] Please check AMION for all [facility] Pharmacy numbers 03/28/2021

## 2021-03-28 NOTE — Progress Notes (Signed)
Physical Therapy Treatment Patient Details Name: [name] MRN: [id number] DOB: 12-Mar-1956 Today's Date: 03/28/2021    History of Present Illness 65 y.o. female presenting to ED 7/14 with presyncopal episodes and recurrent falls x3 in 2 days. Patient admitted with presyncope/weakness secondary to hypotension and AKI. Labs (+) supratherapeutic INR w/hx of aortic valve repair. PMHx significant for aortic stenosis s/p TAVR on warfarin, chronic diastolic CHF, HTN, TIA, OSA on BiPAP.    PT Comments    Pt received in recliner, agreeable to limited session, anxious regarding previous symptoms of room tilting illusion and moderate headache (RN notified, pt reporting no new symptoms during session). Pt performed mobility tasks with min guard to minA and increased time to perform, BP stable and SpO2 WNL on RA. Emphasis on supine/seated LE exercises for maintaining strength within tolerance due to limited recent mobility. Pt may benefit from vestibular assessment next date due to reported room tilting illusion last night/this morning. Pt continues to benefit from PT services to progress toward functional mobility goals.   Follow Up Recommendations  Home health PT;Supervision for mobility/OOB     Equipment Recommendations  Wheelchair (measurements PT);Wheelchair cushion (measurements PT);3in1 (PT)    Recommendations for Other Services       Precautions / Restrictions Precautions Precautions: Fall Precaution Comments: watch BP, HR Restrictions Weight Bearing Restrictions: No    Mobility  Bed Mobility Overal bed mobility: Needs Assistance Bed Mobility: Sit to Supine       Sit to supine: Min assist   General bed mobility comments: LE assist needed    Transfers Overall transfer level: Needs assistance Equipment used: 4-wheeled walker Transfers: Sit to/from [facility] Sit to Stand: Min guard Stand pivot transfers: Min guard       General transfer comment: min  guard to steady, good recall of hand placement, cues for use of brakes on rollator  Ambulation/Gait Ambulation/Gait assistance: Min guard Gait Distance (Feet): 4 Feet Assistive device: 4-wheeled walker Gait Pattern/deviations: Step-to pattern;Step-through pattern;Decreased stride length;Antalgic;Trunk flexed     General Gait Details: from recliner back to bed, pt anxious re: possible vestibular symptoms and LLE pain, deferred longer gait trial.   Stairs             Wheelchair Mobility    Modified Rankin (Stroke Patients Only)       Balance Overall balance assessment: Needs assistance Sitting-balance support: No upper extremity supported Sitting balance-Leahy Scale: Good     Standing balance support: Bilateral upper extremity supported;During functional activity;No upper extremity supported Standing balance-Leahy Scale: Fair Standing balance comment: relies on BUE support dynaimcally but able to engage in ADLs with 0-1 UE support                            Cognition Arousal/Alertness: Awake/alert Behavior During Therapy: Anxious Overall Cognitive Status: Within Functional Limits for tasks assessed                                 General Comments: pt anxious regarding earlier symptoms of room tilting (pt feels as if wall were on floor) but currently no symptoms; pt more restless/anxious this date than yesterday      Exercises General Exercises - Lower Extremity Ankle Circles/Pumps: AROM;Both;20 reps;Supine Heel Slides: AROM;Both;10 reps;Supine (limited ROM on LLE due to pain) Hip ABduction/ADduction: AROM;AAROM;Both;10 reps;Supine (AA on LLE; also performed pillow squeeze ADduction with 5  sh) Straight Leg Raises: AAROM;AROM;Both;10 reps;Supine (AA on LLE)    General Comments General comments (skin integrity, edema, etc.): BP 139/80 (95) reclined in chair, BP 130/95 (107) standing, BP 130/64 (83) supine after return to bed. HR 92 bpm  resting and 110 standing and SpO2 WNL on RA      Pertinent Vitals/Pain Pain Assessment: Faces Faces Pain Scale: Hurts little more Pain Location: L thigh Pain Descriptors / Indicators: Aching;Sore Pain Intervention(s): Limited activity within patient's tolerance;Monitored during session;Repositioned;Heat applied (k-pad under L hip per pt request)    Home Living                      Prior Function            PT Goals (current goals can now be found in the care plan section) Acute Rehab PT Goals Patient Stated Goal: manage pain PT Goal Formulation: With patient Time For Goal Achievement: 04/07/21 Progress towards PT goals: Progressing toward goals    Frequency    Min 3X/week      PT Plan Current plan remains appropriate    Co-evaluation              AM-PAC PT "6 Clicks" Mobility   Outcome Measure  Help needed turning from your back to your side while in a flat bed without using bedrails?: None Help needed moving from lying on your back to sitting on the side of a flat bed without using bedrails?: A Little Help needed moving to and from a bed to a chair (including a wheelchair)?: A Little Help needed standing up from a chair using your arms (e.g., wheelchair or bedside chair)?: A Little Help needed to walk in hospital room?: A Little Help needed climbing 3-5 steps with a railing? : Total 6 Click Score: 17    End of Session Equipment Utilized During Treatment: Gait belt Activity Tolerance: Other (comment);Patient limited by pain (pt appearing restless/anxious due to possible vestibular symptoms, see above) Patient left: in bed;with call bell/phone within reach;with bed alarm set;Other (comment);with family/visitor present (LLE elevated/heel floated, Kpad in place under L hip) Nurse Communication: Mobility status;Other (comment) (headache/L hip pain and BP stable) PT Visit Diagnosis: Unsteadiness on feet (R26.81);Other abnormalities of gait and mobility  (R26.89);Repeated falls (R29.6);Muscle weakness (generalized) (M62.81);History of falling (Z91.81);Difficulty in walking, not elsewhere classified (R26.2);Pain Pain - Right/Left: Left Pain - part of body:  (hip/thigh, frontal headache)     Time: 9292-4462 PT Time Calculation (min) (ACUTE ONLY): 34 min  Charges:  $Therapeutic Exercise: 8-22 mins $Therapeutic Activity: 8-22 mins                     Danamarie Minami P., PTA Acute Rehabilitation Services Pager: [phone] Office: [place] 03/28/2021, 4:33 PM

## 2021-03-29 DIAGNOSIS — N179 Acute kidney failure, unspecified: Secondary | ICD-10-CM | POA: Diagnosis not present

## 2021-03-29 DIAGNOSIS — E860 Dehydration: Secondary | ICD-10-CM | POA: Diagnosis not present

## 2021-03-29 DIAGNOSIS — I5032 Chronic diastolic (congestive) heart failure: Secondary | ICD-10-CM | POA: Diagnosis not present

## 2021-03-29 DIAGNOSIS — I9589 Other hypotension: Secondary | ICD-10-CM | POA: Diagnosis not present

## 2021-03-29 LAB — CBC
HCT: 27.2 % — ABNORMAL LOW (ref 36.0–46.0)
Hemoglobin: 8.7 g/dL — ABNORMAL LOW (ref 12.0–15.0)
MCH: 28.8 pg (ref 26.0–34.0)
MCHC: 32 g/dL (ref 30.0–36.0)
MCV: 90.1 fL (ref 80.0–100.0)
Platelets: 245 10*3/uL (ref 150–400)
RBC: 3.02 MIL/uL — ABNORMAL LOW (ref 3.87–5.11)
RDW: 14.9 % (ref 11.5–15.5)
WBC: 11.7 10*3/uL — ABNORMAL HIGH (ref 4.0–10.5)
nRBC: 0 % (ref 0.0–0.2)

## 2021-03-29 LAB — RENAL FUNCTION PANEL
Albumin: 2.6 g/dL — ABNORMAL LOW (ref 3.5–5.0)
Anion gap: 3 — ABNORMAL LOW (ref 5–15)
BUN: 8 mg/dL (ref 8–23)
CO2: 27 mmol/L (ref 22–32)
Calcium: 9.3 mg/dL (ref 8.9–10.3)
Chloride: 108 mmol/L (ref 98–111)
Creatinine, Ser: 0.84 mg/dL (ref 0.44–1.00)
GFR, Estimated: >60 mL/min (ref >60)
Glucose, Bld: 103 mg/dL — ABNORMAL HIGH (ref 70–99)
Phosphorus: 4.2 mg/dL (ref 2.5–4.6)
Potassium: 4 mmol/L (ref 3.5–5.1)
Sodium: 138 mmol/L (ref 135–145)

## 2021-03-29 LAB — PROTIME-INR
INR: 1.6 — ABNORMAL HIGH (ref 0.8–1.2)
Prothrombin Time: 19.1 seconds — ABNORMAL HIGH (ref 11.4–15.2)

## 2021-03-29 LAB — GLUCOSE, CAPILLARY: Glucose-Capillary: 106 mg/dL — ABNORMAL HIGH (ref 70–99)

## 2021-03-29 LAB — TYPE AND SCREEN
ABO/RH(D): A POS
Antibody Screen: NEGATIVE
Unit division: 0

## 2021-03-29 LAB — BPAM RBC
Blood Product Expiration Date: 202208132359
ISSUE DATE / TIME: 202207202338
Unit Type and Rh: 6200

## 2021-03-29 MED ORDER — WARFARIN SODIUM 2.5 MG PO TABS
2.5000 mg | ORAL_TABLET | Freq: Once | ORAL | Status: AC
  Administered 2021-03-29: 2.5 mg via ORAL
  Filled 2021-03-29: qty 1

## 2021-03-29 NOTE — Progress Notes (Signed)
ANTICOAGULATION CONSULT NOTE  Pharmacy Consult for warfarin Indication: atrial fibrillation  Allergies  Allergen Reactions   Vancomycin Itching    Patient can receive vancomycin with slower infusion and prn benadryl for itching     Patient Measurements: Height: 5\' 1"  (154.9 cm) Weight: 116.2 kg (256 lb 2.8 oz) IBW/kg (Calculated) : 47.8   Vital Signs: Temp: 98.7 F (37.1 C) (07/21 0714) Temp Source: Oral (07/21 0714) BP: 117/72 (07/21 0714) Pulse Rate: 79 (07/21 0714)  Labs: Recent Labs    03/27/21 0020 03/27/21 1505 03/28/21 0051 03/29/21 0520  HGB 7.5* 7.7* 7.3* 8.7*  HCT 23.9* 24.4* 23.3* 27.2*  PLT 201 200 225 245  LABPROT 23.7*  --  21.2* 19.1*  INR 2.1*  --  1.8* 1.6*  CREATININE 0.75  --  0.76 0.84     Estimated Creatinine Clearance: 78.2 mL/min (by C-G formula based on SCr of 0.84 mg/dL).   Medical History: Past Medical History:  Diagnosis Date   Anemia 06/03/2019   Aortic stenosis 03/23/2013   Overview:  02/18/13 TTE EF >55%. Critical AS with mean Ao valve gradient of 82 mm Hg. No AI. No MR, PR, mild TR. Estimated RVSP 30 mm Hg.   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 Postmenopausal bleeding since for the past 2 years.  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 Last biopsy was in May of 2019 which showed weakly proliferative endometrium possibly a polyp.     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      Assessment: 65yof with HX bAVR/TIA (hx valve thrombosis) on warfarin pta 1.25mg  TTSS and 2.5mg  MWF. Admit INR >6, no reversal given. CT with trace L RP hemorrhage.   Warfarin restarted, INR remains low at 1.6, CBC stable.  Goal of Therapy:  INR 2-3 Monitor platelets by anticoagulation protocol: Yes   Plan:  Warfarin 2.5mg  x1 Daily INR   Arrie Senate, PharmD, BCPS, [facility] Clinical Pharmacist [phone] Please check AMION for all [facility] Pharmacy numbers 03/29/2021

## 2021-03-29 NOTE — Plan of Care (Signed)

## 2021-03-29 NOTE — Progress Notes (Signed)
PROGRESS NOTE    GWENDOLYNN MERKEY  KXF:[id number] DOB: 1956-03-25 DOA: 03/22/2021 PCP: [name], MD   Brief Narrative:  The patient is a morbidly obese 65 year old Caucasian female with a past medical history significant for but not limited to aortic stenosis status post TAVR currently on anticoagulation with warfarin, chronic diastolic CHF, hypertension, history of TIA, history of obstructive sleep apnea on BiPAP as well as other comorbidities who presented to the hospital with presyncopal episodes.  She reports that for the last 2 to 3 days she has had falls 3 different times.  She states the falls happen when she is walking across the room and states that her "head feels funny" and states that her immediately feels like her legs give out from underneath her.  She tried using her Rollator yesterday to support her but had a third fall with similar symptoms.  She denies any room spinning sensation.  No loss of consciousness.  She did not hit her head.  She does take torsemide and states last week she had urinary symptoms that she lost about 6 pounds.  She was also noted to have a supratherapeutic INR of 7.8 at her PCP office on 03/13/2021 and she was asked to hold her Coumadin for several days and had repeat on 7/12 which was still elevated at 7.8.  For last few days she has noticed some nosebleeds but denies any blood per rectum or any dark stools.  Approximately 3 weeks ago she was started on bupropion for depression.  In the ED she was noted to be hypotensive down to the 90s over 60s and had an elevated WBC of 20.3.  Creatinine was elevated from her baseline of 0.5 and she was admitted for an AKI.  Given her fall she had a head CT and was negative.  She was admitted for presyncope and weakness in the setting of hypotension and dehydration likely suspected to overdiuresis with torsemide as well as for AKI.   AKI is improving with IVF Hydration and PT/OT recommending Home Health. Patient was  Orthostatic yesterday so she was given her another 1 Liter of IVF and TED hose and Maintenance IVF renewed but at 50 mL/hr. Repeat Orthostatic VS pending to be done. INR trending down and is now 3.7. She had an X-Ray of her Thigh and Hip yesterday due to Pain and it showed "Normal alignment. No fracture or dislocation. Mild left hip degenerative arthritis. Soft tissues are unremarkable." Because she continues to have a bruise and a knot on the Left thigh will obtain a Soft Tissue U/S to assess for Hematoma. Soft Tissue U/S showed  "Focused ultrasound of the area of concern along the left lateral thigh demonstrates soft tissue swelling without discrete fluid collection or hematoma. No soft tissue mass." Patient's Hgb/Hct continue to Drop so will check FOBT and a CT Scan of the Knee and a CT Abd/Pelvis w/o Contrast to evaluate for Retroperitoneal Hematoma.   CT scan of the abdomen pelvis showed suggestion of a trace volume of left retroperitoneal hemorrhage and the left proximal thigh superficial deep subcutaneous soft tissue edema with asymmetric enlarged left proximal thigh musculature compared to the right with possible underlying intramuscular hematoma not fully excluded.  There is no definitive organized fluid collection with limited evaluation on the noncontrasted study.  Patient did have a left pelvic sidewall lymphadenopathy as well as trace left pleural effusion and large bilateral simple renal cysts and aortic atherosclerosis.  Her CT scan of the knee  left knee was negative for mass or fluid collection but there was infiltration of the subcutaneous fat around the anterior aspect of the knee that could be due to soft tissue contusion given the patient's history of fall.  There is negative bony and acute joint abnormalities.  Her hemoglobin dropped further yesterday but improving repeat looks like it stabilized so we will give her a low-dose of Coumadin to see how she responds to this and started at 2 mg  tonight.  Assessment & Plan:   Principal Problem:   Hypotension Active Problems:   H/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   Sleep apnea   Chronic diastolic CHF (congestive heart failure) (HCC)   Weakness   Depression   AKI (acute kidney injury) ([place])   Supratherapeutic INR  Presyncope/weakness secondary to hypotension in the setting Of Orthostatic Hypotension -Suspect that patient is over diuresed with her torsemide causing her to be volume depleted and hypotensive -Has received 1.5 L normal saline fluid in the ED. Give an additional 1 Liter bolus the day before yesterday and will give a 500 mL bolus today given her continued orthostatics; Continue Maintenance IVF at 75 mL/hr but timed out and renewed at 50 mL/hr of LR overnight. -Added TED Hose -Hold antihypertensives -She was noted to be significantly orthostatic with BPs trending down into the 80s and 90s on standing -this has improved with IV fluid resuscitation -Obtain PT and OT to further evaluate and treat and recommending Home Health when Stable for D/C  -Will ensure TED hose worn and may need an abdominal binder   AKI, improving  -Suspect due to overdiuresis with torsemide -Baseline creatinine is around 0.8; she presented with a BUNs/creatinine 35/2.01 and trending down 34/1.73 -> 28/1.43 -> 14/0.95 -> 11/1.02 and is now less than 5/0.75 -Avoid nephrotoxic medications, contrast dyes, hypotension and renally adjust medications -Continue monitor   Hypokalemia -Replaced -Continue monitor and replete as necessary   Supratherapeutic INR w/hx of aortic stenosis status post AVR  -Patient is on chronic anticoagulation due to concerns for thrombosis on aortic valve leaflets -INR 6.4 on admission, but has trended down to 1.8 -Suspect could be due to her AKI.  Only new medication was bupropion 2 weeks ago which is unlikely to cause. -Has had 2 days of nosebleed but no other significant findings of bleeding.  Hemoglobin is  dropping and CT scan as above -CT scan of the abdomen pelvis showed suggestion of a trace volume of left retroperitoneal hemorrhage and the left proximal thigh superficial deep subcutaneous soft tissue edema with asymmetric enlarged left proximal thigh musculature compared to the right with possible underlying intramuscular hematoma not fully excluded.  There is no definitive organized fluid collection with limited evaluation on the noncontrasted study.  Patient did have a left pelvic sidewall lymphadenopathy as well as trace left pleural effusion and large bilateral simple renal cysts and aortic atherosclerosis.  Her CT scan of the knee left knee was negative for mass or fluid collection but there was infiltration of the subcutaneous fat around the anterior aspect of the knee that could be due to soft tissue contusion given the patient's history of fall.  There is negative bony and acute joint abnormalities. -Also check FOBT -Check INR daily -Follow response to resuming her Coumadin   Chronic Diastolic heart failure -Continue to Monitor closely while receiving fluids -Currently appears dry and family thinks that her legs are looking the best they have usually been and this is usually what they are  normally -she has chronic lymphedema in her LLE -Strict I's and O's and daily weights -Continue to monitor for signs and symptoms of volume overload  Anemia -Baseline hemoglobin to be around 12 -she presented with hemoglobin of 17.1, but was likely hemoconcentrated -Patient has had a consistent downtrending hemoglobin since admission -To some degree, this may be related to hemodilution, but also likely related to acute blood loss from left thigh hematoma, related to fall PTA -She does also have some iron deficiency -She has extensive bruising around her left thigh status post fall -She was on anticoagulation prior to admission -Hemoglobin down to 7.3 -she was transfused 1 unit prbc 7/20 -follow up  hgb 8.7 -continue to monitor  Left Thigh Pain and Bruise -Appears to have extensive bruising over left knee and around left hamstring -Area is warm to touch -Imaging thus far including ultrasound and CT of her knee did not show any obvious hematoma -CT femur shows hematoma -Pain Control and K Pad  Room tilt illusion -Describes feeling that the room is tilted downwards, denies any vertigo symptoms -Denies any diplopia -MRI brain negative for any acute abnormalities -PT to perform vestibular therapy   OSA -she does not wish to wear any CPAP/Bipap   Depression -Continue Bupropion and Cymbalta   Hyperlipidemia -Continue with Pravastatin 40 mg p.o. daily   Leukocytosis, improved -Unclear etiology but likely in the setting of hemoconcentration from dehydration from overdiuresis -Initially improved with IV fluids -No fever at this time -Continue to monitor -If persistently remains elevated we will do an infectious work-up and obtain a chest x-ray, urinalysis with urine culture as well as blood cultures -Continue to monitor off of Antibiotics and trend and repeat CBC  Morbid Obesity -Complicates overall prognosis and care -Estimated body mass index is 48.4 kg/m as calculated from the following:   Height as of this encounter: 5\' 1"  (1.549 m).   Weight as of this encounter: 116.2 kg. -Weight Loss and Dietary Counseling given   DVT prophylaxis: coumadin Code Status: FULL CODE Family Communication: Discussed with the husband and daughter at bedside Disposition Plan: seen by PT/OT with recommendations for [facility]. Can likely plan on discharge once INR is therapeutic and hemoglobin remains stable  Status is: Inpatient  Remains inpatient appropriate because:Unsafe d/c plan, IV treatments appropriate due to intensity of illness or inability to take PO, and Inpatient level of care appropriate due to severity of illness  Dispo: The patient is from: Home              Anticipated d/c is to:  Home              Patient currently is not medically stable to d/c.   Difficult to place patient No  Consultants:  None  Procedures: None  Antimicrobials:  Anti-infectives (From admission, onward)    None        Subjective: Says she may be feeling a little better today. Still having some illusions of room tilt.   Objective: Vitals:   03/28/21 2359 03/29/21 0300 03/29/21 0714 03/29/21 1237  BP: (!) 123/52 140/73 117/72 115/62  Pulse: 98 89 79 92  Resp: (!) 21 18 13 14   Temp: 98.6 F (37 C) 98.3 F (36.8 C) 98.7 F (37.1 C) 98.7 F (37.1 C)  TempSrc:  Oral Oral Oral  SpO2:  97% 95% 95%  Weight:      Height:        Intake/Output Summary (Last 24 hours) at 03/29/2021 1412 Last data  filed at 03/29/2021 1021 Gross per 24 hour  Intake 628 ml  Output 1550 ml  Net -922 ml   Filed Weights   03/26/21 0614 03/27/21 0300 03/28/21 0418  Weight: 117.2 kg 117.5 kg 116.2 kg   Examination: Physical Exam:  General exam: Alert, awake, oriented x 3 Respiratory system: Clear to auscultation. Respiratory effort normal. Cardiovascular system:RRR. No murmurs, rubs, gallops. Gastrointestinal system: Abdomen is nondistended, soft and nontender. No organomegaly or masses felt. Normal bowel sounds heard. Central nervous system: Alert and oriented. No focal neurological deficits. Extremities: extensive bruising noted on superior aspect of left knee as well as posterior left thigh.  Skin is warm to touch Skin: No rashes, lesions or ulcers Psychiatry: Judgement and insight appear normal. Mood & affect appropriate.    Data Reviewed: I have personally reviewed following labs and imaging studies  CBC: Recent Labs  Lab 03/25/21 0014 03/26/21 0001 03/27/21 0020 03/27/21 1505 03/28/21 0051 03/29/21 0520  WBC 9.1 9.0 10.1 9.7 12.0* 11.7*  NEUTROABS 5.7 5.6 6.5 6.8 8.1*  --   HGB 9.5* 7.9* 7.5* 7.7* 7.3* 8.7*  HCT 29.9* 25.0* 23.9* 24.4* 23.3* 27.2*  MCV 87.2 89.6 89.5 88.7 89.3  90.1  PLT 159 178 201 200 225 250   Basic Metabolic Panel: Recent Labs  Lab 03/24/21 0015 03/25/21 0014 03/26/21 0001 03/27/21 0020 03/28/21 0051 03/29/21 0520  NA 134* 136 136 137 135 138  K 3.2* 3.7 3.8 4.0 4.1 4.0  CL 98 104 103 105 105 108  CO2 29 27 26 29 26 27   GLUCOSE 117* 106* 116* 94 115* 103*  BUN 28* 14 11 <5* 6* 8  CREATININE 1.43* 0.95 1.02* 0.75 0.76 0.84  CALCIUM 9.2 9.0 8.9 9.0 9.0 9.3  MG 2.0 2.0 2.0 2.1 2.1  --   PHOS 2.6 2.6 2.8 2.7 3.1 4.2   GFR: Estimated Creatinine Clearance: 78.2 mL/min (by C-G formula based on SCr of 0.84 mg/dL). Liver Function Tests: Recent Labs  Lab 03/24/21 0015 03/25/21 0014 03/26/21 0001 03/27/21 0020 03/28/21 0051 03/29/21 0520  AST 25 27 28  40 30  --   ALT 15 15 17 20 19   --   ALKPHOS 49 41 39 50 51  --   BILITOT 0.4 0.7 0.8 1.1 1.4*  --   PROT 5.9* 5.7* 5.3* 5.2* 5.8*  --   ALBUMIN 2.9* 2.7* 2.5* 2.3* 2.5* 2.6*   Recent Labs  Lab 03/22/21 1932  LIPASE 24   No results for input(s): AMMONIA in the last 168 hours. Coagulation Profile: Recent Labs  Lab 03/25/21 0014 03/26/21 0001 03/27/21 0020 03/28/21 0051 03/29/21 0520  INR 3.7* 2.5* 2.1* 1.8* 1.6*   Cardiac Enzymes: No results for input(s): CKTOTAL, CKMB, CKMBINDEX, TROPONINI in the last 168 hours. BNP (last 3 results) Recent Labs    06/06/20 0903  PROBNP 1,079*   HbA1C: No results for input(s): HGBA1C in the last 72 hours. CBG: Recent Labs  Lab 03/25/21 0620 03/26/21 0617 03/27/21 0534 03/28/21 0624 03/29/21 0617  GLUCAP 103* 96 102* 96 106*   Lipid Profile: No results for input(s): CHOL, HDL, LDLCALC, TRIG, CHOLHDL, LDLDIRECT in the last 72 hours. Thyroid Function Tests: No results for input(s): TSH, T4TOTAL, FREET4, T3FREE, THYROIDAB in the last 72 hours.  Anemia Panel: No results for input(s): VITAMINB12, FOLATE, FERRITIN, TIBC, IRON, RETICCTPCT in the last 72 hours.  Sepsis Labs: No results for input(s): PROCALCITON, LATICACIDVEN  in the last 168 hours.  Recent Results (from the past 240  hour(s))  Urine Culture     Status: Abnormal   Collection Time: 03/22/21  6:56 PM   Specimen: Urine, Clean Catch  Result Value Ref Range Status   Specimen Description URINE, CLEAN CATCH  Final   Special Requests NONE  Final   Culture (A)  Final    10,000 COLONIES/mL LACTOBACILLUS SPECIES Standardized susceptibility testing for this organism is not available. Performed at [facility], [place] [address], [place], [place] [address]    Report Status 03/24/2021 FINAL  Final  SARS CORONAVIRUS 2 (TAT 6-24 HRS) Nasopharyngeal Nasopharyngeal Swab     Status: None   Collection Time: 03/22/21  9:55 PM   Specimen: Nasopharyngeal Swab  Result Value Ref Range Status   SARS Coronavirus 2 NEGATIVE NEGATIVE Final    Comment: (NOTE) SARS-CoV-2 target nucleic acids are NOT DETECTED.  The SARS-CoV-2 RNA is generally detectable in upper and lower respiratory specimens during the acute phase of infection. Negative results do not preclude SARS-CoV-2 infection, do not rule out co-infections with other pathogens, and should not be used as the sole basis for treatment or other patient management decisions. Negative results must be combined with clinical observations, patient history, and epidemiological information. The expected result is Negative.  Fact Sheet for Patients: SugarRoll.be  Fact Sheet for Healthcare Providers: https://www.woods-mathews.com/  This test is not yet approved or cleared by the [place] FDA and  has been authorized for detection and/or diagnosis of SARS-CoV-2 by FDA under an Emergency Use Authorization (EUA). This EUA will remain  in effect (meaning this test can be used) for the duration of the COVID-19 declaration under Se ction 564(b)(1) of the Act, 21 U.S.C. section 360bbb-3(b)(1), unless the authorization is terminated or revoked sooner.  Performed at [facility], [place] [address], [place], [place] [address]   MRSA Next Gen by PCR, Nasal     Status: Abnormal   Collection Time: 03/23/21  1:32 AM   Specimen: Nasal Mucosa; Nasal Swab  Result Value Ref Range Status   MRSA by PCR Next Gen DETECTED (A) NOT DETECTED Final    Comment: RESULT CALLED TO, READ BACK BY AND VERIFIED WITH: A. TEPE,RN 0430 03/23/2021 T. TYSOR (NOTE) The GeneXpert MRSA Assay (FDA approved for NASAL specimens only), is one component of a comprehensive MRSA colonization surveillance program. It is not intended to diagnose MRSA infection nor to guide or monitor treatment for MRSA infections. Test performance is not FDA approved in patients less than 54 years old. Performed at [facility], [place] [address], [place], [place] [address]     RN Pressure Injury Documentation:     Estimated body mass index is 48.4 kg/m as calculated from the following:   Height as of this encounter: 5\' 1"  (1.549 m).   Weight as of this encounter: 116.2 kg.  Malnutrition Type:  Malnutrition Characteristics:  Nutrition Interventions:  Radiology Studies: MR BRAIN WO CONTRAST  Result Date: 03/28/2021 CLINICAL DATA:  Dizziness, persistent/recurrent, cardiac or vascular cause suspected EXAM: MRI HEAD WITHOUT CONTRAST TECHNIQUE: Multiplanar, multiecho pulse sequences of the brain and surrounding structures were obtained without intravenous contrast. COMPARISON:  06/04/2019 FINDINGS: Brain: No acute infarct, mass effect or extra-axial collection. No acute or chronic hemorrhage. There is multifocal hyperintense T2-weighted signal within the white matter. Generalized volume loss without a clear lobar predilection. The midline structures are normal. Vascular: Major flow voids are preserved. Skull and upper cervical spine: Normal calvarium and skull base. Visualized upper cervical spine and  soft tissues are normal. Sinuses/Orbits:No paranasal sinus fluid levels or advanced mucosal thickening. No  mastoid or middle ear effusion. Normal orbits. IMPRESSION: 1. No acute intracranial abnormality. 2. Generalized volume loss and findings of chronic microvascular ischemia. Electronically Signed   By: Ulyses Jarred M.D.   On: 03/28/2021 22:39   CT FEMUR LEFT WO CONTRAST  Result Date: 03/29/2021 CLINICAL DATA:  Left thigh bruising and low hemoglobin. Evaluate for hematoma. EXAM: CT OF THE LOWER LEFT EXTREMITY WITHOUT CONTRAST TECHNIQUE: Multidetector CT imaging of the left thigh was performed according to the standard protocol. COMPARISON:  CT left knee dated March 26, 2021. Left femur x-rays dated March 25, 2021. FINDINGS: Bones/Joint/Cartilage No fracture or dislocation. Mild left hip and knee osteoarthritis. No joint effusion. Ligaments Ligaments are suboptimally evaluated by CT. Muscles and Tendons There is enlargement and loss of the normal internal architecture involving the proximal vastus lateralis muscle, with a few areas of ill-defined hypodensity. No measurable fluid collection or hematoma. Soft tissue No soft tissue mass. Unchanged prepatellar soft tissue swelling. IUD in the endometrial canal. IMPRESSION: 1. Enlargement and loss of the normal internal architecture involving the proximal vastus lateralis muscle, with a few areas of ill-defined hypodensity. Findings are suspicious for intramuscular hematoma. 2. No acute osseous abnormality. Electronically Signed   By: Titus Dubin M.D.   On: 03/29/2021 08:11    Scheduled Meds:  sodium chloride   Intravenous Once   buPROPion  150 mg Oral Daily   diclofenac Sodium  2 g Topical QID   DULoxetine  60 mg Oral Daily   folic acid  1 mg Oral Daily   oxybutynin  5 mg Oral QPM   polyethylene glycol  17 g Oral BID   pravastatin  40 mg Oral q1800   senna-docusate  1 tablet Oral BID   sodium chloride flush  3 mL Intravenous Q12H   vitamin B-12  1,000 mcg Oral Daily   warfarin  2.5 mg Oral ONCE-1600   Warfarin - Pharmacist Dosing Inpatient   Does not  apply q1600   Continuous Infusions:   LOS: 6 days   Kathie Dike, MD [facility] PAGER is on [place]  If 7PM-7AM, please contact night-coverage [email or web address]

## 2021-03-29 NOTE — Progress Notes (Signed)
Mobility Specialist: Progress Note   03/29/21 1518  Mobility  Activity Ambulated in hall  Level of Assistance Minimal assist, patient does 75% or more  Assistive Device Four wheel walker  Distance Ambulated (ft) 104 ft (64'+40')  Mobility Ambulated with assistance in hallway  Mobility Response Tolerated well  Mobility performed by Mobility specialist  Bed Position Chair  $Mobility charge 1 Mobility   Pre-Mobility: 96 HR, 100% SpO2 During Mobility: 125 HR Post-Mobility: 94 HR, 94% SpO2  Pt c/o pain in her upper LLE during ambulation, no rating given. Pt also visibly SOB during ambulation. Pt required one seated break lasting roughly 1 minute d/t fatigue and SOB. Pt to the recliner after walk with call bell at her side and pt's husband present in the room.   [facility] [place] Mobility Specialist Mobility Specialist Phone: [phone]

## 2021-03-29 NOTE — Plan of Care (Signed)

## 2021-03-29 NOTE — Care Management Important Message (Signed)
Important Message  Patient Details  Name: [name] MRN: [id number] Date of Birth: Aug 25, 1956   Medicare Important Message Given:  Yes     Orbie Pyo 03/29/2021, 3:34 PM

## 2021-03-30 DIAGNOSIS — I5032 Chronic diastolic (congestive) heart failure: Secondary | ICD-10-CM | POA: Diagnosis not present

## 2021-03-30 DIAGNOSIS — N179 Acute kidney failure, unspecified: Secondary | ICD-10-CM | POA: Diagnosis not present

## 2021-03-30 DIAGNOSIS — Z954 Presence of other heart-valve replacement: Secondary | ICD-10-CM | POA: Diagnosis not present

## 2021-03-30 DIAGNOSIS — I951 Orthostatic hypotension: Secondary | ICD-10-CM | POA: Diagnosis not present

## 2021-03-30 LAB — RENAL FUNCTION PANEL
Albumin: 2.7 g/dL — ABNORMAL LOW (ref 3.5–5.0)
Anion gap: 6 (ref 5–15)
BUN: 8 mg/dL (ref 8–23)
CO2: 24 mmol/L (ref 22–32)
Calcium: 9.3 mg/dL (ref 8.9–10.3)
Chloride: 105 mmol/L (ref 98–111)
Creatinine, Ser: 0.74 mg/dL (ref 0.44–1.00)
GFR, Estimated: >60 mL/min (ref >60)
Glucose, Bld: 100 mg/dL — ABNORMAL HIGH (ref 70–99)
Phosphorus: 3.3 mg/dL (ref 2.5–4.6)
Potassium: 4 mmol/L (ref 3.5–5.1)
Sodium: 135 mmol/L (ref 135–145)

## 2021-03-30 LAB — CBC
HCT: 27.8 % — ABNORMAL LOW (ref 36.0–46.0)
Hemoglobin: 8.8 g/dL — ABNORMAL LOW (ref 12.0–15.0)
MCH: 28.5 pg (ref 26.0–34.0)
MCHC: 31.7 g/dL (ref 30.0–36.0)
MCV: 90 fL (ref 80.0–100.0)
Platelets: 281 10*3/uL (ref 150–400)
RBC: 3.09 MIL/uL — ABNORMAL LOW (ref 3.87–5.11)
RDW: 15.2 % (ref 11.5–15.5)
WBC: 13.8 10*3/uL — ABNORMAL HIGH (ref 4.0–10.5)
nRBC: 0 % (ref 0.0–0.2)

## 2021-03-30 LAB — PROTIME-INR
INR: 1.7 — ABNORMAL HIGH (ref 0.8–1.2)
Prothrombin Time: 19.7 seconds — ABNORMAL HIGH (ref 11.4–15.2)

## 2021-03-30 LAB — GLUCOSE, CAPILLARY: Glucose-Capillary: 98 mg/dL (ref 70–99)

## 2021-03-30 MED ORDER — METOPROLOL TARTRATE 12.5 MG HALF TABLET
12.5000 mg | ORAL_TABLET | Freq: Two times a day (BID) | ORAL | Status: DC
  Administered 2021-03-31 (×2): 12.5 mg via ORAL
  Filled 2021-03-30 (×2): qty 1

## 2021-03-30 MED ORDER — METOPROLOL TARTRATE 12.5 MG HALF TABLET: 12.5000 mg | ORAL_TABLET | Freq: Two times a day (BID) | ORAL | Status: DC

## 2021-03-30 MED ORDER — WARFARIN SODIUM 2.5 MG PO TABS
2.5000 mg | ORAL_TABLET | Freq: Once | ORAL | Status: AC
  Administered 2021-03-30: 2.5 mg via ORAL
  Filled 2021-03-30: qty 1

## 2021-03-30 NOTE — Progress Notes (Signed)
PROGRESS NOTE    [name]  [id number] DOB: [birth year] DOA: 03/22/2021 PCP: [name], MD    Chief Complaint  Patient presents with   Near Syncope    Brief Narrative:  H/o aortic stenosis status post TAVR currently on anticoagulation with warfarin, chronic diastolic CHF, hypertension, TIA, history of obstructive sleep apnea on BiPAP presented to the hospital with presyncopal episodes.  She reports that for the last 2 to 3 days she has had falls 3 different times   In the ED she was noted to be hypotensive down to the 90s over 60s and had an elevated WBC of 20.3.  Creatinine was elevated from her baseline of 0.5 and she was admitted for an AKI.  Given her fall she had a head CT and was negative.  She was admitted for presyncope and weakness in the setting of hypotension and dehydration , aki likely suspected to overdiuresis with torsemide as well as supratherapeutic INR  Subjective:  Sitting up in chair, reports seeing walls coming down, room distortion Denies headache, does reports ear muffling Husband at bedside  PT at bedside  INR 1.7 today  Assessment & Plan:   Principal Problem:   Hypotension Active Problems:   H/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   Sleep apnea   Chronic diastolic CHF (congestive heart failure) (HCC)   Weakness   Depression   AKI (acute kidney injury) (HCC)   Supratherapeutic INR   Presyncope/weakness secondary to hypotension in the setting Of Orthostatic Hypotension -Suspect that patient is over diuresed with her torsemide causing her to be volume depleted and hypotensive -Home medication metoprolol held since admission ,she received IV hydration, -Blood pressure start to trend up, resume low dose  metoprolol from tomorrow Repeat orthostatic vital signs  Dizziness/ room tilted downwards/ also reports ear muffling -mri brain no acute findings -repeat orthostatic vital signs -Vestibular assessment  Leukocytosis Monitor  trend, if continue increase, will check chest x-ray, blood culture, repeat UA  AKI on CKD 2 -Likely prerenal due to hypotension and dehydration -Dirty UA probably not a clean-catch, urine culture no significant growth -Creatinine is back to baseline after hydration, holding Demadex and metoprolol  Hypokalemia, replaced  Left thigh pain/bruising -Patient does has extensive bruising of the left knee around left hamstring,  -Multiple imaging including left lower extremity soft tissue ultrasound , CT of left femur, CT of left knee, CT  abdomen/plevis done - suspect left proximal thigh intramuscular hematoma , a trace volume left retroperitoneal hemorrhage, -left lower extremity ultrasound ultrasound and CT of her knee did not show any obvious hematoma -monitor hgb    Normocytic anemia -Hemoglobin on presentation was 17, likely due to dehydration hemoconcentration, her hemoglobin likely at baseline around 14 -Hgb nadir at 7.3, received 1 unit PRBC on 7/21 -Hemoglobin today is 8.8 -FOBT ordered yesterday , not colleted yet, will reorder FOBT, secure chat message also sent to RN in addition to place order, awaiting for collection -monitor hgb  Supratherapeutic INR with history of aortic stenosis status post AVR -INR 6.4 on admission, Coumadin was held initially, resumed on 7/19 -INR down to 1.7, appreciate pharmacy assistance with Coumadin dosing   Hyperlipidemia -Continue with Pravastatin 40 mg p.o. daily  Chronic diastolic CHF Presented with dehydration/hypotension Holding Lopressor and Demadex She received IV hydration Monitor volume status  Chronic left lower extremity lymphedema Continue Ace wrap  Depression -Continue Bupropion and Cymbalta    OSA -she does not wish to wear any CPAP/Bipap  The patient's BMI is: Body mass index is 47.36 kg/m.Marland Kitchen    FTT , needs home health      Unresulted Labs (From admission, onward)     Start     Ordered   03/28/21 0500  CBC   Daily,   R     Question:  Specimen collection method  Answer:  Lab=Lab collect   03/27/21 1642   03/25/21 0500  Protime-INR  Daily,   R     Question:  Specimen collection method  Answer:  Lab=Lab collect   03/24/21 0723   Unscheduled  Occult blood card to lab, stool  As needed,   R      03/26/21 0830              DVT prophylaxis: Place TED hose Start: 03/24/21 1059 SCDs Start: 03/22/21 2137   Code Status: Full Family Communication: Husband at bedside Disposition:   Status is: Inpatient     Dispo: The patient is from: Home              Anticipated d/c is to: Home health              Anticipated d/c date is: To be determined, monitor WBC, hemoglobin, INR            Consultants:  None  Procedures:  None  Antimicrobials:   Anti-infectives (From admission, onward)    None           Objective: Vitals:   03/29/21 2333 03/30/21 0348 03/30/21 0616 03/30/21 0902  BP: 137/71 (!) 149/76  140/84  Pulse: 94 84  99  Resp: '19 19  17  '$ Temp: 97.7 F (36.5 C) 98.2 F (36.8 C)  98.8 F (37.1 C)  TempSrc: Oral Oral  Oral  SpO2: 95% 94%  95%  Weight:   113.7 kg   Height:        Intake/Output Summary (Last 24 hours) at 03/30/2021 1339 Last data filed at 03/30/2021 [id number] Gross per 24 hour  Intake --  Output 1400 ml  Net -1400 ml   Filed Weights   03/27/21 0300 03/28/21 0418 03/30/21 0616  Weight: 117.5 kg 116.2 kg 113.7 kg    Examination:  General exam: calm, NAD, no nystagmus Respiratory system:  Respiratory effort normal. Cardiovascular system: RRR.  Gastrointestinal system: Abdomen is nondistended, soft and nontender.+ bs Central nervous system: Alert and oriented. No focal neurological deficits. Extremities: Chronic lymphedema left lower extremity, wrapped with Ace wrap, right lower extremity with TED hose, no edema Skin: No rashes, lesions or ulcers Psychiatry: Judgement and insight appear normal. Mood & affect appropriate.     Data Reviewed: I  have personally reviewed following labs and imaging studies  CBC: Recent Labs  Lab 03/25/21 0014 03/26/21 0001 03/27/21 0020 03/27/21 1505 03/28/21 0051 03/29/21 0520 03/30/21 0044  WBC 9.1 9.0 10.1 9.7 12.0* 11.7* 13.8*  NEUTROABS 5.7 5.6 6.5 6.8 8.1*  --   --   HGB 9.5* 7.9* 7.5* 7.7* 7.3* 8.7* 8.8*  HCT 29.9* 25.0* 23.9* 24.4* 23.3* 27.2* 27.8*  MCV 87.2 89.6 89.5 88.7 89.3 90.1 90.0  PLT 159 178 201 200 225 245 [id number]    Basic Metabolic Panel: Recent Labs  Lab 03/24/21 0015 03/25/21 0014 03/26/21 0001 03/27/21 0020 03/28/21 0051 03/29/21 0520 03/30/21 0044  NA 134* 136 136 137 135 138 135  K 3.2* 3.7 3.8 4.0 4.1 4.0 4.0  CL 98 104 103 105 105 108 105  CO2 29 27  $'26 29 26 27 24  'i$ GLUCOSE 117* 106* 116* 94 115* 103* 100*  BUN 28* 14 11 <5* 6* 8 8  CREATININE 1.43* 0.95 1.02* 0.75 0.76 0.84 0.74  CALCIUM 9.2 9.0 8.9 9.0 9.0 9.3 9.3  MG 2.0 2.0 2.0 2.1 2.1  --   --   PHOS 2.6 2.6 2.8 2.7 3.1 4.2 3.3    GFR: Estimated Creatinine Clearance: 81 mL/min (by C-G formula based on SCr of 0.74 mg/dL).  Liver Function Tests: Recent Labs  Lab 03/24/21 0015 03/25/21 0014 03/26/21 0001 03/27/21 0020 03/28/21 0051 03/29/21 0520 03/30/21 0044  AST '25 27 28 '$ 40 30  --   --   ALT '15 15 17 20 19  '$ --   --   ALKPHOS 49 41 39 50 51  --   --   BILITOT 0.4 0.7 0.8 1.1 1.4*  --   --   PROT 5.9* 5.7* 5.3* 5.2* 5.8*  --   --   ALBUMIN 2.9* 2.7* 2.5* 2.3* 2.5* 2.6* 2.7*    CBG: Recent Labs  Lab 03/26/21 0617 03/27/21 0534 03/28/21 0624 03/29/21 0617 03/30/21 0610  GLUCAP 96 102* 96 106* 98     Recent Results (from the past 240 hour(s))  Urine Culture     Status: Abnormal   Collection Time: 03/22/21  6:56 PM   Specimen: Urine, Clean Catch  Result Value Ref Range Status   Specimen Description URINE, CLEAN CATCH  Final   Special Requests NONE  Final   Culture (A)  Final    10,000 COLONIES/mL LACTOBACILLUS SPECIES Standardized susceptibility testing for this organism is  not available. Performed at [facility], [place] [address] [place], [place] [address]    Report Status 03/24/2021 FINAL  Final  SARS CORONAVIRUS 2 (TAT 6-24 HRS) Nasopharyngeal Nasopharyngeal Swab     Status: None   Collection Time: 03/22/21  9:55 PM   Specimen: Nasopharyngeal Swab  Result Value Ref Range Status   SARS Coronavirus 2 NEGATIVE NEGATIVE Final    Comment: (NOTE) SARS-CoV-2 target nucleic acids are NOT DETECTED.  The SARS-CoV-2 RNA is generally detectable in upper and lower respiratory specimens during the acute phase of infection. Negative results do not preclude SARS-CoV-2 infection, do not rule out co-infections with other pathogens, and should not be used as the sole basis for treatment or other patient management decisions. Negative results must be combined with clinical observations, patient history, and epidemiological information. The expected result is Negative.  Fact Sheet for Patients: SugarRoll.be  Fact Sheet for Healthcare Providers: https://www.woods-mathews.com/  This test is not yet approved or cleared by the [place] FDA and  has been authorized for detection and/or diagnosis of SARS-CoV-2 by FDA under an Emergency Use Authorization (EUA). This EUA will remain  in effect (meaning this test can be used) for the duration of the COVID-19 declaration under Se ction 564(b)(1) of the Act, 21 U.S.C. section 360bbb-3(b)(1), unless the authorization is terminated or revoked sooner.  Performed at [facility], [place] [address] [place], [place] [address]   MRSA Next Gen by PCR, Nasal     Status: Abnormal   Collection Time: 03/23/21  1:32 AM   Specimen: Nasal Mucosa; Nasal Swab  Result Value Ref Range Status   MRSA by PCR Next Gen DETECTED (A) NOT DETECTED Final    Comment: RESULT CALLED TO, READ BACK BY AND VERIFIED WITH: A. TEPE,RN 0430 03/23/2021 T. TYSOR (NOTE) The GeneXpert MRSA Assay (FDA  approved for  NASAL specimens only), is one component of a comprehensive MRSA colonization surveillance program. It is not intended to diagnose MRSA infection nor to guide or monitor treatment for MRSA infections. Test performance is not FDA approved in patients less than 74 years old. Performed at [facility], [place] [address], [place], [place] [address]          Radiology Studies: MR BRAIN WO CONTRAST  Result Date: 03/28/2021 CLINICAL DATA:  Dizziness, persistent/recurrent, cardiac or vascular cause suspected EXAM: MRI HEAD WITHOUT CONTRAST TECHNIQUE: Multiplanar, multiecho pulse sequences of the brain and surrounding structures were obtained without intravenous contrast. COMPARISON:  06/04/2019 FINDINGS: Brain: No acute infarct, mass effect or extra-axial collection. No acute or chronic hemorrhage. There is multifocal hyperintense T2-weighted signal within the white matter. Generalized volume loss without a clear lobar predilection. The midline structures are normal. Vascular: Major flow voids are preserved. Skull and upper cervical spine: Normal calvarium and skull base. Visualized upper cervical spine and soft tissues are normal. Sinuses/Orbits:No paranasal sinus fluid levels or advanced mucosal thickening. No mastoid or middle ear effusion. Normal orbits. IMPRESSION: 1. No acute intracranial abnormality. 2. Generalized volume loss and findings of chronic microvascular ischemia. Electronically Signed   By: Ulyses Jarred M.D.   On: 03/28/2021 22:39   CT FEMUR LEFT WO CONTRAST  Result Date: 03/29/2021 CLINICAL DATA:  Left thigh bruising and low hemoglobin. Evaluate for hematoma. EXAM: CT OF THE LOWER LEFT EXTREMITY WITHOUT CONTRAST TECHNIQUE: Multidetector CT imaging of the left thigh was performed according to the standard protocol. COMPARISON:  CT left knee dated March 26, 2021. Left femur x-rays dated March 25, 2021. FINDINGS: Bones/Joint/Cartilage No fracture or dislocation. Mild left  hip and knee osteoarthritis. No joint effusion. Ligaments Ligaments are suboptimally evaluated by CT. Muscles and Tendons There is enlargement and loss of the normal internal architecture involving the proximal vastus lateralis muscle, with a few areas of ill-defined hypodensity. No measurable fluid collection or hematoma. Soft tissue No soft tissue mass. Unchanged prepatellar soft tissue swelling. IUD in the endometrial canal. IMPRESSION: 1. Enlargement and loss of the normal internal architecture involving the proximal vastus lateralis muscle, with a few areas of ill-defined hypodensity. Findings are suspicious for intramuscular hematoma. 2. No acute osseous abnormality. Electronically Signed   By: Titus Dubin M.D.   On: 03/29/2021 08:11        Scheduled Meds:  sodium chloride   Intravenous Once   buPROPion  150 mg Oral Daily   diclofenac Sodium  2 g Topical QID   DULoxetine  60 mg Oral Daily   folic acid  1 mg Oral Daily   oxybutynin  5 mg Oral QPM   polyethylene glycol  17 g Oral BID   pravastatin  40 mg Oral q1800   senna-docusate  1 tablet Oral BID   sodium chloride flush  3 mL Intravenous Q12H   vitamin B-12  1,000 mcg Oral Daily   Warfarin - Pharmacist Dosing Inpatient   Does not apply q1600   Continuous Infusions:   LOS: 7 days   Time spent: 92mns Greater than 50% of this time was spent in counseling, explanation of diagnosis, planning of further management, and coordination of care.   Voice Recognition /Viviann Sparedictation system was used to create this note, attempts have been made to correct errors. Please contact the author with questions and/or clarifications.   FFlorencia Reasons MD PhD FACP [facility]  Available via Epic secure chat 7am-7pm for nonurgent issues Please page  for urgent issues To page the attending provider between 7A-7P or the covering provider during after hours 7P-7A, please log into the web site [email or web address] and access using universal [place]  password for that web site. If you do not have the password, please call the hospital operator.    03/30/2021, 1:39 PM

## 2021-03-30 NOTE — Progress Notes (Signed)
Physical Therapy Treatment Patient Details Name: [name] MRN: [id number] DOB: 04-04-1956 Today's Date: 03/30/2021    History of Present Illness 65 y.o. female presenting to ED 7/14 with presyncopal episodes and recurrent falls x3 in 2 days. Patient admitted with presyncope/weakness secondary to hypotension and AKI. Labs (+) supratherapeutic INR w/hx of aortic valve repair. PMHx significant for aortic stenosis s/p TAVR on warfarin, chronic diastolic CHF, HTN, TIA, OSA on BiPAP.    PT Comments    Pt progressing well toward goals.  The room tilting illusion described by pt/husband did not occur during our session with specific attempts to elicit the symptom.  Completed basic vestibular testing to rule out a positional vestibular source.  Negative for posterior/horizontal BPPV or an occulomotor cause.  Otherwise, emphasis on safe transitions, transfers/sit to stands and progression of gait stability with the Rollator with monitoring the HR which rose into the low 130's with activity.    Follow Up Recommendations  Home health PT;Supervision for mobility/OOB     Equipment Recommendations  3in1 (PT)    Recommendations for Other Services       Precautions / Restrictions Precautions Precautions: Fall Precaution Comments: watch BP, HR Restrictions Weight Bearing Restrictions: No    Mobility  Bed Mobility Overal bed mobility: Needs Assistance Bed Mobility: Sit to Supine;Supine to Sit     Supine to sit: Min guard Sit to supine: Min guard   General bed mobility comments: flat bed, managed well with UE assist    Transfers Overall transfer level: Needs assistance Equipment used: 4-wheeled walker Transfers: Sit to/from Stand Sit to Stand: Supervision         General transfer comment: cues for locking of brakes and consistent placement of UE's  Ambulation/Gait Ambulation/Gait assistance: Min guard Gait Distance (Feet): 120 Feet Assistive device: 4-wheeled walker Gait  Pattern/deviations: Step-through pattern   Gait velocity interpretation: <1.8 ft/sec, indicate of risk for recurrent falls General Gait Details: generally steady with mildly antalgic gait on the L, good control of the rollator.  SpO2 mid to upper 90's, HR sustained in the low 130's with little SOB noted.   Stairs             Wheelchair Mobility    Modified Rankin (Stroke Patients Only)       Balance Overall balance assessment: Needs assistance Sitting-balance support: No upper extremity supported Sitting balance-Leahy Scale: Good     Standing balance support: No upper extremity supported;Single extremity supported Standing balance-Leahy Scale: Fair Standing balance comment: BUE support dynamically during ambulation but able to engage in ADls with 0-1 hand support                            Cognition Arousal/Alertness: Awake/alert Behavior During Therapy: WFL for tasks assessed/performed Overall Cognitive Status: Within Functional Limits for tasks assessed                                        Exercises General Exercises - Lower Extremity Ankle Circles/Pumps: AROM;Both;20 reps;Seated Long Arc Quad: AROM;Strengthening;Both;10 reps;Seated Heel Slides: AROM;Both;10 reps;Supine (with graded resistance.) Other Exercises Other Exercises: bil bicep/tricep presses x 10 reps with graded resistance.    General Comments General comments (skin integrity, edema, etc.): MD asked for r/o of vestibular source for "room tilting illusion".  Basic Vestibular testing results--answers to question not definitively leading toward vestibular source.  Saccades and pursuits WFL, VOR negative, Hallpike Dix R/L and supine head roll did not ellicit any symptoms or nystagmus.  Therefore basic vestibular source ruled out.      Pertinent Vitals/Pain Pain Assessment: Faces Faces Pain Scale: Hurts little more Pain Location: L thigh Pain Descriptors / Indicators:  Aching;Sore Pain Intervention(s): Monitored during session    Home Living                      Prior Function            PT Goals (current goals can now be found in the care plan section) Acute Rehab PT Goals Patient Stated Goal: manage pain PT Goal Formulation: With patient Time For Goal Achievement: 04/07/21 Potential to Achieve Goals: Good Progress towards PT goals: Progressing toward goals    Frequency    Min 3X/week      PT Plan Current plan remains appropriate    Co-evaluation              AM-PAC PT "6 Clicks" Mobility   Outcome Measure  Help needed turning from your back to your side while in a flat bed without using bedrails?: None Help needed moving from lying on your back to sitting on the side of a flat bed without using bedrails?: A Little Help needed moving to and from a bed to a chair (including a wheelchair)?: A Little Help needed standing up from a chair using your arms (e.g., wheelchair or bedside chair)?: A Little Help needed to walk in hospital room?: A Little Help needed climbing 3-5 steps with a railing? : A Lot 6 Click Score: 18    End of Session   Activity Tolerance: Patient tolerated treatment well;Patient limited by pain Patient left: in bed;with call bell/phone within reach;with bed alarm set;Other (comment);with family/visitor present Nurse Communication: Mobility status PT Visit Diagnosis: Unsteadiness on feet (R26.81);Other abnormalities of gait and mobility (R26.89);Muscle weakness (generalized) (M62.81);Difficulty in walking, not elsewhere classified (R26.2);Other symptoms and signs involving the nervous system (R29.898) Pain - Right/Left: Left Pain - part of body: Leg     Time: 1300-1346 PT Time Calculation (min) (ACUTE ONLY): 46 min  Charges:  $Gait Training: 8-22 mins $Therapeutic Activity: 8-22 mins $Neuromuscular Re-education: 8-22 mins                     03/30/2021  Ginger Carne., PT Acute Rehabilitation  Services [phone]  (pager) [phone]  (office)   [name] 03/30/2021, 2:12 PM

## 2021-03-30 NOTE — Progress Notes (Signed)
[place] for warfarin Indication: atrial fibrillation  Allergies  Allergen Reactions   Vancomycin Itching    Patient can receive vancomycin with slower infusion and prn benadryl for itching     Patient Measurements: Height: '5\' 1"'$  (154.9 cm) Weight: 113.7 kg (250 lb 10.6 oz) IBW/kg (Calculated) : 47.8   Vital Signs: Temp: 98.8 F (37.1 C) (07/22 0902) Temp Source: Oral (07/22 0902) BP: 140/84 (07/22 0902) Pulse Rate: 99 (07/22 0902)  Labs: Recent Labs    03/28/21 0051 03/29/21 0520 03/30/21 0044  HGB 7.3* 8.7* 8.8*  HCT 23.3* 27.2* 27.8*  PLT 225 245 281  LABPROT 21.2* 19.1* 19.7*  INR 1.8* 1.6* 1.7*  CREATININE 0.76 0.84 0.74     Estimated Creatinine Clearance: 81 mL/min (by C-G formula based on SCr of 0.74 mg/dL).   Medical History: Past Medical History:  Diagnosis Date   Anemia 06/03/2019   Aortic stenosis 03/23/2013   Overview:  02/18/13 TTE EF >55%. Critical AS with mean Ao valve gradient of 82 mm Hg. No AI. No MR, PR, mild TR. Estimated RVSP 30 mm Hg.   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 Postmenopausal bleeding since for the past 2 years.  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 Last biopsy was in May of 2019 which showed weakly proliferative endometrium possibly a polyp.     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      Assessment: 65yof with HX bAVR/TIA (hx valve thrombosis) on warfarin pta 1.'25mg'$  TTSS and 2.'5mg'$  MWF. Admit INR >6, no reversal given. CT with trace L RP hemorrhage.   INR remains low at 1.7, CBC stable. No bleeding issues noted.   Goal of Therapy:  INR 2-3 Monitor platelets by anticoagulation protocol: Yes   Plan:  Warfarin 2.'5mg'$  x1 Daily INR  Erin Hearing PharmD., BCPS Clinical Pharmacist 03/30/2021 1:47 PM

## 2021-03-30 NOTE — Progress Notes (Signed)
Occupational Therapy Treatment Patient Details Name: MCCALL DEBERG MRN: [id number] DOB: 1956-05-07 Today's Date: 03/30/2021    History of present illness 65 y.o. female presenting to ED 7/14 with presyncopal episodes and recurrent falls x3 in 2 days. Patient admitted with presyncope/weakness secondary to hypotension and AKI. Labs (+) supratherapeutic INR w/hx of aortic valve repair. PMHx significant for aortic stenosis s/p TAVR on warfarin, chronic diastolic CHF, HTN, TIA, OSA on BiPAP.   OT comments  Pt progressing well towards OT goals. Completing transfers using rollator with supervision today, functional mobility with min guard for safety/balance.  Toileting with supervision and grooming with supervision. Cueing for pacing and safety throughout session.  HR up to 120 during ADL tasks.  Remains limited by decreased activity tolerance and L LE pain, but highly motivated.  HHOT remains appropriate. Will follow.   Follow Up Recommendations  Home health OT;Supervision/Assistance - 24 hour    Equipment Recommendations  3 in 1 bedside commode    Recommendations for Other Services      Precautions / Restrictions Precautions Precautions: Fall Precaution Comments: watch BP, HR Restrictions Weight Bearing Restrictions: No       Mobility Bed Mobility               General bed mobility comments: in recliner upon entry    Transfers Overall transfer level: Needs assistance Equipment used: 4-wheeled walker Transfers: Sit to/from Stand Sit to Stand: Supervision         General transfer comment: close supervision for safety and cueing for hand placement    Balance Overall balance assessment: Needs assistance Sitting-balance support: No upper extremity supported Sitting balance-Leahy Scale: Good     Standing balance support: Single extremity supported;During functional activity Standing balance-Leahy Scale: Fair Standing balance comment: BUE support dynamically during  ambulation but able to engage in ADls with 0-1 hand support                           ADL either performed or assessed with clinical judgement   ADL Overall ADL's : Needs assistance/impaired     Grooming: Supervision/safety;Standing;Wash/dry hands                 Lower Body Dressing Details (indicate cue type and reason): assist to don socks- this is baseline Toilet Transfer: Supervision/safety;Ambulation;Regular Toilet;Grab bars (rollator)   Toileting- Water quality scientist and Hygiene: Supervision/safety;Sit to/from stand       Functional mobility during ADLs: Min guard (rollator) General ADL Comments: HR up to 120 during ADLs, on RA     Vision   Vision Assessment?: No apparent visual deficits   Perception     Praxis      Cognition Arousal/Alertness: Awake/alert Behavior During Therapy: WFL for tasks assessed/performed Overall Cognitive Status: Within Functional Limits for tasks assessed                                          Exercises     Shoulder Instructions       General Comments VSS on RA, HR up to 120    Pertinent Vitals/ Pain       Pain Assessment: Faces Faces Pain Scale: Hurts little more Pain Location: L thigh Pain Descriptors / Indicators: Aching;Sore Pain Intervention(s): Limited activity within patient's tolerance;Monitored during session;Repositioned  Home Living  Prior Functioning/Environment              Frequency  Min 2X/week        Progress Toward Goals  OT Goals(current goals can now be found in the care plan section)  Progress towards OT goals: Progressing toward goals  Acute Rehab OT Goals Patient Stated Goal: manage pain OT Goal Formulation: With patient  Plan Discharge plan remains appropriate;Frequency remains appropriate    Co-evaluation                 AM-PAC OT "6 Clicks" Daily Activity     Outcome  Measure   Help from another person eating meals?: None Help from another person taking care of personal grooming?: A Little Help from another person toileting, which includes using toliet, bedpan, or urinal?: A Little Help from another person bathing (including washing, rinsing, drying)?: A Lot Help from another person to put on and taking off regular upper body clothing?: A Little Help from another person to put on and taking off regular lower body clothing?: A Lot 6 Click Score: 17    End of Session Equipment Utilized During Treatment: Other (comment) (rollator)  OT Visit Diagnosis: Unsteadiness on feet (R26.81);Muscle weakness (generalized) (M62.81);Repeated falls (R29.6);Pain Pain - Right/Left: Left Pain - part of body: Leg   Activity Tolerance Patient tolerated treatment well   Patient Left in chair;with call bell/phone within reach;with chair alarm set;with nursing/sitter in room   Nurse Communication Mobility status        Time: [id number] OT Time Calculation (min): 23 min  Charges: OT General Charges $OT Visit: 1 Visit OT Treatments $Self Care/Home Management : 23-37 mins  [place] Pager [phone] Office [phone]    Delight Stare 03/30/2021, 12:53 PM

## 2021-03-31 DIAGNOSIS — I951 Orthostatic hypotension: Secondary | ICD-10-CM | POA: Diagnosis not present

## 2021-03-31 DIAGNOSIS — Z954 Presence of other heart-valve replacement: Secondary | ICD-10-CM | POA: Diagnosis not present

## 2021-03-31 DIAGNOSIS — I5032 Chronic diastolic (congestive) heart failure: Secondary | ICD-10-CM | POA: Diagnosis not present

## 2021-03-31 DIAGNOSIS — N179 Acute kidney failure, unspecified: Secondary | ICD-10-CM | POA: Diagnosis not present

## 2021-03-31 LAB — PROTIME-INR
INR: 2 — ABNORMAL HIGH (ref 0.8–1.2)
Prothrombin Time: 22.5 seconds — ABNORMAL HIGH (ref 11.4–15.2)

## 2021-03-31 LAB — MAGNESIUM: Magnesium: 2.1 mg/dL (ref 1.7–2.4)

## 2021-03-31 LAB — CBC WITH DIFFERENTIAL/PLATELET
Abs Immature Granulocytes: 0.1 10*3/uL — ABNORMAL HIGH (ref 0.00–0.07)
Basophils Absolute: 0.1 10*3/uL (ref 0.0–0.1)
Basophils Relative: 0 %
Eosinophils Absolute: 0.8 10*3/uL — ABNORMAL HIGH (ref 0.0–0.5)
Eosinophils Relative: 6 %
HCT: 27.9 % — ABNORMAL LOW (ref 36.0–46.0)
Hemoglobin: 8.7 g/dL — ABNORMAL LOW (ref 12.0–15.0)
Immature Granulocytes: 1 %
Lymphocytes Relative: 16 %
Lymphs Abs: 2 10*3/uL (ref 0.7–4.0)
MCH: 28 pg (ref 26.0–34.0)
MCHC: 31.2 g/dL (ref 30.0–36.0)
MCV: 89.7 fL (ref 80.0–100.0)
Monocytes Absolute: 0.9 10*3/uL (ref 0.1–1.0)
Monocytes Relative: 7 %
Neutro Abs: 8.5 10*3/uL — ABNORMAL HIGH (ref 1.7–7.7)
Neutrophils Relative %: 70 %
Platelets: 321 10*3/uL (ref 150–400)
RBC: 3.11 MIL/uL — ABNORMAL LOW (ref 3.87–5.11)
RDW: 15.6 % — ABNORMAL HIGH (ref 11.5–15.5)
WBC: 12.3 10*3/uL — ABNORMAL HIGH (ref 4.0–10.5)
nRBC: 0.2 % (ref 0.0–0.2)

## 2021-03-31 LAB — COMPREHENSIVE METABOLIC PANEL
ALT: 15 U/L (ref 0–44)
AST: 17 U/L (ref 15–41)
Albumin: 2.7 g/dL — ABNORMAL LOW (ref 3.5–5.0)
Alkaline Phosphatase: 48 U/L (ref 38–126)
Anion gap: 7 (ref 5–15)
BUN: 10 mg/dL (ref 8–23)
CO2: 23 mmol/L (ref 22–32)
Calcium: 9.4 mg/dL (ref 8.9–10.3)
Chloride: 105 mmol/L (ref 98–111)
Creatinine, Ser: 0.75 mg/dL (ref 0.44–1.00)
GFR, Estimated: >60 mL/min (ref >60)
Glucose, Bld: 88 mg/dL (ref 70–99)
Potassium: 4 mmol/L (ref 3.5–5.1)
Sodium: 135 mmol/L (ref 135–145)
Total Bilirubin: 1.8 mg/dL — ABNORMAL HIGH (ref 0.3–1.2)
Total Protein: 6.1 g/dL — ABNORMAL LOW (ref 6.5–8.1)

## 2021-03-31 LAB — GLUCOSE, CAPILLARY: Glucose-Capillary: 87 mg/dL (ref 70–99)

## 2021-03-31 MED ORDER — CYCLOBENZAPRINE HCL 10 MG PO TABS
5.0000 mg | ORAL_TABLET | Freq: Three times a day (TID) | ORAL | Status: DC | PRN
  Administered 2021-03-31: 5 mg via ORAL
  Filled 2021-03-31: qty 1

## 2021-03-31 MED ORDER — WARFARIN SODIUM 2.5 MG PO TABS
2.5000 mg | ORAL_TABLET | Freq: Once | ORAL | Status: AC
  Administered 2021-03-31: 2.5 mg via ORAL
  Filled 2021-03-31: qty 1

## 2021-03-31 MED ORDER — HYDRALAZINE HCL 10 MG PO TABS
10.0000 mg | ORAL_TABLET | Freq: Once | ORAL | Status: AC
  Administered 2021-03-31: 10 mg via ORAL
  Filled 2021-03-31: qty 1

## 2021-03-31 MED ORDER — METOPROLOL TARTRATE 25 MG PO TABS
25.0000 mg | ORAL_TABLET | Freq: Two times a day (BID) | ORAL | Status: DC
  Administered 2021-04-01 – 2021-04-02 (×3): 25 mg via ORAL
  Filled 2021-03-31 (×3): qty 1

## 2021-03-31 MED ORDER — FERROUS SULFATE 325 (65 FE) MG PO TABS
325.0000 mg | ORAL_TABLET | Freq: Every day | ORAL | Status: DC
  Administered 2021-04-01 – 2021-04-02 (×2): 325 mg via ORAL
  Filled 2021-03-31 (×2): qty 1

## 2021-03-31 NOTE — Progress Notes (Signed)
PROGRESS NOTE    [name]  [id number] DOB: 1956-05-14 DOA: 03/22/2021 PCP: [name], MD    Chief Complaint  Patient presents with   Near Syncope    Brief Narrative:  H/o aortic stenosis status post TAVR currently on anticoagulation with warfarin, chronic diastolic CHF, hypertension, TIA, history of obstructive sleep apnea on BiPAP presented to the hospital with presyncopal episodes.  She reports that for the last 2 to 3 days she has had falls 3 different times   In the ED she was noted to be hypotensive down to the 90s over 60s and had an elevated WBC of 20.3.  Creatinine was elevated from her baseline of 0.5 and she was admitted for an AKI.  Given her fall she had a head CT and was negative.  She was admitted for presyncope and weakness in the setting of hypotension and dehydration , aki likely suspected to overdiuresis with torsemide as well as supratherapeutic INR  Subjective:  Sitting up in chair, reports continue to seeing walls coming down , room distortion at times, denies headache, no tinnitus  Reports involuntory dorsiflexion of bilateral feet today  Hgb stable start to trend up  left knee /thigh pain has improved Fobt was not collected but reports Stool is brown,   Husband and daughter at bedside   Assessment & Plan:   Principal Problem:   Hypotension Active Problems:   H/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   Sleep apnea   Chronic diastolic CHF (congestive heart failure) (HCC)   Weakness   Depression   AKI (acute kidney injury) ([place])   Supratherapeutic INR   Presyncope/weakness secondary to hypotension in the setting Of Orthostatic Hypotension -Suspect that patient is over diuresed with her torsemide causing her to be volume depleted and hypotensive -Home medication metoprolol held since admission ,she received IV hydration, -Blood pressure start to trend up, resume low dose  metoprolol from tomorrow Repeat orthostatic vital  signs  Visual distortion/ room tilted downwards -mri brain no acute findings - orthostatic vital signs has improved  -Vestibular assessment did not suggest BPPV -ocular seizure/migraine?,  Will consult neurology  Leukocytosis Reactive from larger bruises on left thigh/leg? Monitor trend, if continue increase, will check chest x-ray, blood culture, repeat UA  AKI on CKD 2 -Likely prerenal due to hypotension and dehydration -Dirty UA probably not a clean-catch, urine culture no significant growth -Creatinine is back to baseline after hydration, holding Demadex   Hypokalemia, replaced  Left thigh pain/bruising -Patient does has extensive bruising of the left knee around left hamstring,  -Multiple imaging including left lower extremity soft tissue ultrasound , CT of left femur, CT of left knee, CT  abdomen/plevis done - suspect left proximal thigh intramuscular hematoma , a trace volume left retroperitoneal hemorrhage, -left lower extremity ultrasound ultrasound and CT of her knee did not show any obvious hematoma -monitor hgb    Normocytic anemia -Hemoglobin on presentation was 17, likely due to dehydration hemoconcentration, her hemoglobin likely at baseline around 14 -Hgb nadir at 7.3, received 1 unit PRBC on 7/21 -Hemoglobin today is 8.8 -FOBT ordered x2, was not collected,  secure chat message also sent to RNx2 in addition to place order, awaiting for collection -Patient reports stool is brown -monitor hgb  Supratherapeutic INR with history of aortic stenosis status post AVR -INR 6.4 on admission, Coumadin was held initially, resumed on 7/19 -INR 2.0 today, appreciate pharmacy assistance with Coumadin dosing   Hyperlipidemia -Continue with Pravastatin 40  mg p.o. daily  Hypertension Presented with hypotension Lopressor and Demadex held initially Blood pressure start to increase, gradually restart Lopressor  Chronic diastolic CHF Presented with  dehydration/hypotension Lopressor and Demadex held initially She received IV hydration Monitor volume status  Chronic left lower extremity lymphedema Continue Ace wrap  Depression -Continue Bupropion and Cymbalta, reports recently started two months ago, patient has greatly benefited it    OSA -she does not wish to wear any CPAP/Bipap The patient's BMI is: Body mass index is 46.82 kg/m.Marland Kitchen    FTT , needs home health      Unresulted Labs (From admission, onward)     Start     Ordered   04/01/21 0500  CBC  Daily,   R     Question:  Specimen collection method  Answer:  Lab=Lab collect   03/30/21 1918   03/25/21 0500  Protime-INR  Daily,   R     Question:  Specimen collection method  Answer:  Lab=Lab collect   03/24/21 [id number]   Unscheduled  Occult blood card to lab, stool  As needed,   R      03/26/21 0830   Unscheduled  Occult blood card to lab, stool  As needed,   R      03/30/21 1918              DVT prophylaxis: Place TED hose Start: 03/24/21 1059 SCDs Start: 03/22/21 2137   Code Status: Full Family Communication: Husband and  daughter at bedside Disposition:   Status is: Inpatient     Dispo: The patient is from: Home              Anticipated d/c is to: Home health              Anticipated d/c date is: To be determined, monitor WBC, hemoglobin, INR, needs neurology consult            Consultants:   neurology   Procedures:  None  Antimicrobials:   Anti-infectives (From admission, onward)    None           Objective: Vitals:   03/31/21 0734 03/31/21 0736 03/31/21 1205 03/31/21 1549  BP:  (!) 163/74 140/87 97/83  Pulse:   66 74  Resp: '15  15 16  '$ Temp: 98.4 F (36.9 C)  98.4 F (36.9 C) 98.4 F (36.9 C)  TempSrc: Oral  Oral Oral  SpO2:  93% 90% 96%  Weight:      Height:        Intake/Output Summary (Last 24 hours) at 03/31/2021 1613 Last data filed at 03/31/2021 1300 Gross per 24 hour  Intake 323 ml  Output 1150 ml  Net -827 ml    Filed Weights   03/28/21 0418 03/30/21 0616 03/31/21 0621  Weight: 116.2 kg 113.7 kg 112.4 kg    Examination:  General exam: calm, NAD, no nystagmus Respiratory system:  Respiratory effort normal. Cardiovascular system: RRR.  Gastrointestinal system: Abdomen is nondistended, soft and nontender.+ bs Central nervous system: Alert and oriented. No focal neurological deficits. Extremities: Chronic lymphedema left lower extremity, wrapped with Ace wrap, right lower extremity with TED hose, no edema Skin: No rashes, lesions or ulcers Psychiatry: Judgement and insight appear normal. Mood & affect appropriate.     Data Reviewed: I have personally reviewed following labs and imaging studies  CBC: Recent Labs  Lab 03/26/21 0001 03/27/21 0020 03/27/21 1505 03/28/21 0051 03/29/21 0520 03/30/21 0044 03/31/21 0042  WBC 9.0 10.1  9.7 12.0* 11.7* 13.8* 12.3*  NEUTROABS 5.6 6.5 6.8 8.1*  --   --  8.5*  HGB 7.9* 7.5* 7.7* 7.3* 8.7* 8.8* 8.7*  HCT 25.0* 23.9* 24.4* 23.3* 27.2* 27.8* 27.9*  MCV 89.6 89.5 88.7 89.3 90.1 90.0 89.7  PLT 178 201 200 225 245 281 [id number]    Basic Metabolic Panel: Recent Labs  Lab 03/25/21 0014 03/26/21 0001 03/27/21 0020 03/28/21 0051 03/29/21 0520 03/30/21 0044 03/31/21 0042  NA 136 136 137 135 138 135 135  K 3.7 3.8 4.0 4.1 4.0 4.0 4.0  CL 104 103 105 105 108 105 105  CO2 '27 26 29 26 27 24 23  '$ GLUCOSE 106* 116* 94 115* 103* 100* 88  BUN 14 11 <5* 6* '8 8 10  '$ CREATININE 0.95 1.02* 0.75 0.76 0.84 0.74 0.75  CALCIUM 9.0 8.9 9.0 9.0 9.3 9.3 9.4  MG 2.0 2.0 2.1 2.1  --   --  2.1  PHOS 2.6 2.8 2.7 3.1 4.2 3.3  --     GFR: Estimated Creatinine Clearance: 80.4 mL/min (by C-G formula based on SCr of 0.75 mg/dL).  Liver Function Tests: Recent Labs  Lab 03/25/21 0014 03/26/21 0001 03/27/21 0020 03/28/21 0051 03/29/21 0520 03/30/21 0044 03/31/21 0042  AST 27 28 40 30  --   --  17  ALT '15 17 20 19  '$ --   --  15  ALKPHOS 41 39 50 51  --   --  48   BILITOT 0.7 0.8 1.1 1.4*  --   --  1.8*  PROT 5.7* 5.3* 5.2* 5.8*  --   --  6.1*  ALBUMIN 2.7* 2.5* 2.3* 2.5* 2.6* 2.7* 2.7*    CBG: Recent Labs  Lab 03/27/21 0534 03/28/21 0624 03/29/21 0617 03/30/21 0610 03/31/21 0614  GLUCAP 102* 96 106* 98 87     Recent Results (from the past 240 hour(s))  Urine Culture     Status: Abnormal   Collection Time: 03/22/21  6:56 PM   Specimen: Urine, Clean Catch  Result Value Ref Range Status   Specimen Description URINE, CLEAN CATCH  Final   Special Requests NONE  Final   Culture (A)  Final    10,000 COLONIES/mL LACTOBACILLUS SPECIES Standardized susceptibility testing for this organism is not available. Performed at [facility], [place] [address], [place], [place] [address]    Report Status 03/24/2021 FINAL  Final  SARS CORONAVIRUS 2 (TAT 6-24 HRS) Nasopharyngeal Nasopharyngeal Swab     Status: None   Collection Time: 03/22/21  9:55 PM   Specimen: Nasopharyngeal Swab  Result Value Ref Range Status   SARS Coronavirus 2 NEGATIVE NEGATIVE Final    Comment: (NOTE) SARS-CoV-2 target nucleic acids are NOT DETECTED.  The SARS-CoV-2 RNA is generally detectable in upper and lower respiratory specimens during the acute phase of infection. Negative results do not preclude SARS-CoV-2 infection, do not rule out co-infections with other pathogens, and should not be used as the sole basis for treatment or other patient management decisions. Negative results must be combined with clinical observations, patient history, and epidemiological information. The expected result is Negative.  Fact Sheet for Patients: SugarRoll.be  Fact Sheet for Healthcare Providers: https://www.woods-mathews.com/  This test is not yet approved or cleared by the [place] FDA and  has been authorized for detection and/or diagnosis of SARS-CoV-2 by FDA under an Emergency Use Authorization (EUA). This EUA will remain   in effect (meaning this test can be used) for the duration  of the COVID-19 declaration under Se ction 564(b)(1) of the Act, 21 U.S.C. section 360bbb-3(b)(1), unless the authorization is terminated or revoked sooner.  Performed at [facility], [place] [address], [place], [place] [address]   MRSA Next Gen by PCR, Nasal     Status: Abnormal   Collection Time: 03/23/21  1:32 AM   Specimen: Nasal Mucosa; Nasal Swab  Result Value Ref Range Status   MRSA by PCR Next Gen DETECTED (A) NOT DETECTED Final    Comment: RESULT CALLED TO, READ BACK BY AND VERIFIED WITH: A. TEPE,RN 0430 03/23/2021 T. TYSOR (NOTE) The GeneXpert MRSA Assay (FDA approved for NASAL specimens only), is one component of a comprehensive MRSA colonization surveillance program. It is not intended to diagnose MRSA infection nor to guide or monitor treatment for MRSA infections. Test performance is not FDA approved in patients less than 26 years old. Performed at [facility], [place] [address] [place], [place] [address]          Radiology Studies: No results found.      Scheduled Meds:  sodium chloride   Intravenous Once   buPROPion  150 mg Oral Daily   diclofenac Sodium  2 g Topical QID   DULoxetine  60 mg Oral Daily   [START ON 04/01/2021] ferrous sulfate  325 mg Oral Q breakfast   folic acid  1 mg Oral Daily   metoprolol tartrate  12.5 mg Oral BID   oxybutynin  5 mg Oral QPM   polyethylene glycol  17 g Oral BID   pravastatin  40 mg Oral q1800   senna-docusate  1 tablet Oral BID   sodium chloride flush  3 mL Intravenous Q12H   vitamin B-12  1,000 mcg Oral Daily   Warfarin - Pharmacist Dosing Inpatient   Does not apply q1600   Continuous Infusions:   LOS: 8 days   Time spent: 36mns Greater than 50% of this time was spent in counseling, explanation of diagnosis, planning of further management, and coordination of care.   Voice Recognition /Viviann Sparedictation system was used to create this  note, attempts have been made to correct errors. Please contact the author with questions and/or clarifications.   FFlorencia Reasons MD PhD FACP [facility]  Available via Epic secure chat 7am-7pm for nonurgent issues Please page for urgent issues To page the attending provider between 7A-7P or the covering provider during after hours 7P-7A, please log into the web site [email or web address] and access using universal [place] password for that web site. If you do not have the password, please call the hospital operator.    03/31/2021, 4:13 PM

## 2021-03-31 NOTE — Progress Notes (Signed)
Approximately 1430, patient asked writer to come to the room and reported what feels to her like involuntary plantar flexion of both feet. She also states that when her feet are in a relaxed position, it feels like "someone is pushing them" into a flexion position. She also reports earlier this am feeling pressure around the right foot in a band like fashion both across the distal foot area right above the toes and the medial foot/distal ankle. Patient states she is anxious about this situation.  Vitals are stable. Patient is able to perform flexion and extension with equal strength bilaterally.   Physician was paged. See orders. Continue to monitor.

## 2021-03-31 NOTE — Plan of Care (Signed)
  Problem: Education: Goal: Knowledge of General Education information will improve Description: Including pain rating scale, medication(s)/side effects and non-pharmacologic comfort measures Outcome: Progressing   Problem: Health Behavior/Discharge Planning: Goal: Ability to manage health-related needs will improve Outcome: Progressing   Problem: Clinical Measurements: Goal: Will remain free from infection Outcome: Progressing   Problem: Activity: Goal: Risk for activity intolerance will decrease Outcome: Progressing   Problem: Pain Managment: Goal: General experience of comfort will improve Outcome: Progressing   Problem: Safety: Goal: Ability to remain free from injury will improve Outcome: Progressing   

## 2021-03-31 NOTE — Progress Notes (Signed)
ANTICOAGULATION CONSULT NOTE  Pharmacy Consult for warfarin Indication: atrial fibrillation  Allergies  Allergen Reactions   Vancomycin Itching    Patient can receive vancomycin with slower infusion and prn benadryl for itching     Patient Measurements: Height: '5\' 1"'$  (154.9 cm) Weight: 112.4 kg (247 lb 12.8 oz) IBW/kg (Calculated) : 47.8   Vital Signs: Temp: 98.4 F (36.9 C) (07/23 1205) Temp Source: Oral (07/23 1205) BP: 140/87 (07/23 1205) Pulse Rate: 66 (07/23 1205)  Labs: Recent Labs    03/29/21 0520 03/30/21 0044 03/31/21 0042  HGB 8.7* 8.8* 8.7*  HCT 27.2* 27.8* 27.9*  PLT 245 281 321  LABPROT 19.1* 19.7* 22.5*  INR 1.6* 1.7* 2.0*  CREATININE 0.84 0.74 0.75     Estimated Creatinine Clearance: 80.4 mL/min (by C-G formula based on SCr of 0.75 mg/dL).   Medical History: Past Medical History:  Diagnosis Date   Anemia 06/03/2019   Aortic stenosis 03/23/2013   Overview:  02/18/13 TTE EF >55%. Critical AS with mean Ao valve gradient of 82 mm Hg. No AI. No MR, PR, mild TR. Estimated RVSP 30 mm Hg.   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 Postmenopausal bleeding since for the past 2 years.  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 Last biopsy was in May of 2019 which showed weakly proliferative endometrium possibly a polyp.     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   Assessment: 65yof with HX bAVR/TIA (hx valve thrombosis) on warfarin pta 1.'25mg'$  TTSS and 2.'5mg'$  MWF. Admit INR >6, no reversal given. CT with trace L RP hemorrhage.   INR now at goal at 2, CBC stable with hgb in 8s. No bleeding issues noted.   Goal of Therapy:  INR 2-3 Monitor platelets by anticoagulation protocol: Yes   Plan:  Warfarin 2.'5mg'$  x1 Daily INR  Erin Hearing PharmD., BCPS Clinical Pharmacist 03/31/2021 12:13 PM

## 2021-03-31 NOTE — Progress Notes (Signed)
At 1720, patient requested nurse to room. She reported a visual disturbance that felt exactly like "looking through wax paper." This lasted approximately 7 minutes.   Vital signs and neuro exam were done. Neuro assessment was unremarkable. Patient was hypertensive, 197/91 in rt arm with retake in lt arm 168/85.  Patient stated she no longer wanted to take Voltaren, wanting to eliminate this new medication as a source of recent visual and muscular events.  MD was paged to report event and also elevated blood pressure. Hydralazine 10 mg one time p.o. was ordered. Continue to monitor.

## 2021-04-01 DIAGNOSIS — Z954 Presence of other heart-valve replacement: Secondary | ICD-10-CM | POA: Diagnosis not present

## 2021-04-01 DIAGNOSIS — R443 Hallucinations, unspecified: Secondary | ICD-10-CM | POA: Diagnosis not present

## 2021-04-01 DIAGNOSIS — I5032 Chronic diastolic (congestive) heart failure: Secondary | ICD-10-CM | POA: Diagnosis not present

## 2021-04-01 DIAGNOSIS — I9589 Other hypotension: Secondary | ICD-10-CM | POA: Diagnosis not present

## 2021-04-01 DIAGNOSIS — N179 Acute kidney failure, unspecified: Secondary | ICD-10-CM | POA: Diagnosis not present

## 2021-04-01 LAB — URINALYSIS, ROUTINE W REFLEX MICROSCOPIC
Bilirubin Urine: NEGATIVE
Glucose, UA: NEGATIVE mg/dL
Hgb urine dipstick: NEGATIVE
Ketones, ur: NEGATIVE mg/dL
Leukocytes,Ua: NEGATIVE
Nitrite: NEGATIVE
Protein, ur: NEGATIVE mg/dL
Specific Gravity, Urine: 1.015 (ref 1.005–1.030)
pH: 8 (ref 5.0–8.0)

## 2021-04-01 LAB — CBC
HCT: 32.5 % — ABNORMAL LOW (ref 36.0–46.0)
Hemoglobin: 10.1 g/dL — ABNORMAL LOW (ref 12.0–15.0)
MCH: 28.1 pg (ref 26.0–34.0)
MCHC: 31.1 g/dL (ref 30.0–36.0)
MCV: 90.3 fL (ref 80.0–100.0)
Platelets: 411 10*3/uL — ABNORMAL HIGH (ref 150–400)
RBC: 3.6 MIL/uL — ABNORMAL LOW (ref 3.87–5.11)
RDW: 15.8 % — ABNORMAL HIGH (ref 11.5–15.5)
WBC: 14 10*3/uL — ABNORMAL HIGH (ref 4.0–10.5)
nRBC: 0 % (ref 0.0–0.2)

## 2021-04-01 LAB — GLUCOSE, CAPILLARY: Glucose-Capillary: 88 mg/dL (ref 70–99)

## 2021-04-01 LAB — PROTIME-INR
INR: 2.3 — ABNORMAL HIGH (ref 0.8–1.2)
Prothrombin Time: 25 seconds — ABNORMAL HIGH (ref 11.4–15.2)

## 2021-04-01 MED ORDER — WARFARIN 1.25 MG HALF TABLET
1.2500 mg | ORAL_TABLET | Freq: Once | ORAL | Status: AC
  Administered 2021-04-01: 1.25 mg via ORAL
  Filled 2021-04-01: qty 1

## 2021-04-01 NOTE — Progress Notes (Signed)
Mobility Specialist: Progress Note   04/01/21 1811  Mobility  Activity Ambulated in hall  Level of Assistance Standby assist, set-up cues, supervision of patient - no hands on  Assistive Device Four wheel walker  Distance Ambulated (ft) 270 ft  Mobility Ambulated with assistance in hallway  Mobility Response Tolerated well  Mobility performed by Mobility specialist  Bed Position Chair  $Mobility charge 1 Mobility   Pre-Mobility: 85 HR, 97% SpO2 During Mobility: 110 HR Post-Mobility: 87 HR, 97% SpO2  Pt visibly SOB during ambulation, otherwise asx. Pt to the recliner after walk with call bell at her side and family member present in the room.   [facility] Mackey Varricchio Mobility Specialist Mobility Specialist Phone: [phone]

## 2021-04-01 NOTE — Progress Notes (Signed)
ANTICOAGULATION CONSULT NOTE  Pharmacy Consult for warfarin Indication: atrial fibrillation  Allergies  Allergen Reactions   Vancomycin Itching    Patient can receive vancomycin with slower infusion and prn benadryl for itching     Patient Measurements: Height: '5\' 1"'$  (154.9 cm) Weight: 112.8 kg (248 lb 10.9 oz) IBW/kg (Calculated) : 47.8   Vital Signs: Temp: 98.2 F (36.8 C) (07/24 0411) Temp Source: Oral (07/24 0411) BP: 122/91 (07/24 0411) Pulse Rate: 70 (07/24 0411)  Labs: Recent Labs    03/30/21 0044 03/31/21 0042 04/01/21 0042  HGB 8.8* 8.7* 10.1*  HCT 27.8* 27.9* 32.5*  PLT 281 321 411*  LABPROT 19.7* 22.5* 25.0*  INR 1.7* 2.0* 2.3*  CREATININE 0.74 0.75  --      Estimated Creatinine Clearance: 80.6 mL/min (by C-G formula based on SCr of 0.75 mg/dL).   Medical History: Past Medical History:  Diagnosis Date   Anemia 06/03/2019   Aortic stenosis 03/23/2013   Overview:  02/18/13 TTE EF >55%. Critical AS with mean Ao valve gradient of 82 mm Hg. No AI. No MR, PR, mild TR. Estimated RVSP 30 mm Hg.   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 Postmenopausal bleeding since for the past 2 years.  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 Last biopsy was in May of 2019 which showed weakly proliferative endometrium possibly a polyp.     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   Assessment: 65yof with HX bAVR/TIA (hx valve thrombosis) on warfarin pta 1.'25mg'$  TTSS and 2.'5mg'$  MWF. Admit INR >6, no reversal given. CT with trace L RP hemorrhage.   INR continues to be at goal at 2.3, CBC stable with hgb improved to 10.1. No bleeding issues noted.   Goal of Therapy:  INR 2-3 Monitor platelets by anticoagulation protocol: Yes   Plan:  Transition back to home dose with 1.'25mg'$  tonight Daily INR for now  Erin Hearing PharmD., BCPS Clinical Pharmacist 04/01/2021 9:39 AM

## 2021-04-01 NOTE — TOC Progression Note (Addendum)
Transition of Care [facility]) - Progression Note    Patient Details  Name: [name] MRN: [id number] Date of Birth: 02/02/1956  [facility]) CM/SW Contact  Zenon Mayo, RN Phone Number: 04/01/2021, 11:26 AM  Clinical Narrative:    NCM spoke with patient at bedside , offered choice, she chose [facility].  NCM left message for return call for HHPT.  Awaiting call back to see if they can take referral for HHPT.  Patient states her spouse wraps her legs for her and he does a great job, he has been doing this for a while now.  She states she does not want Occupational therapy she just want HHPT.   7/25- Received call back from [facility] with  [facility], she states they will take patient back for HHPT.  Soc will begin 24 to 48 hrs post dc.     Expected Discharge Plan: [place] Barriers to Discharge: Continued Medical Work up  Expected Discharge Plan and Services Expected Discharge Plan: [place]   Discharge Planning Services: CM Consult Post Acute Care Choice: New [place] arrangements for the past 2 months: Single Family Home                   DME Agency: NA       HH Arranged: PT HH Agency: [place] Date [place]: 04/01/21 Time [place]: 1124 Representative spoke with at [place]: left mes with rep   Social Determinants of Health (SDOH) Interventions    Readmission Risk Interventions No flowsheet data found.

## 2021-04-01 NOTE — Progress Notes (Addendum)
PROGRESS NOTE    [name]  [id number] DOB: 06-09-1956 DOA: 03/22/2021 PCP: [name], MD    Chief Complaint  Patient presents with   Near Syncope    Brief Narrative:  H/o aortic stenosis status post TAVR currently on anticoagulation with warfarin, chronic diastolic CHF, hypertension, TIA, history of obstructive sleep apnea on BiPAP presented to the hospital with presyncopal episodes.  She reports that for the last 2 to 3 days she has had falls 3 different times   In the ED she was noted to be hypotensive down to the 90s over 60s and had an elevated WBC of 20.3.  Creatinine was elevated from her baseline of 0.5 and she was admitted for an AKI.  Given her fall she had a head CT and was negative.  She was admitted for presyncope and weakness in the setting of hypotension and dehydration , aki likely suspected to overdiuresis with torsemide as well as supratherapeutic INR  Subjective:  Sitting up in chair, she does not seem to agree that her visual disturbance is from wellbutrin, she reports in the past she was on it for a year without issues She Reports involuntory dorsiflexion of bilateral feet started from 7/23, reports flexeril help some, she still has it today but not as severe She does not feel she can go home today She wants to see if she still has visual hallucination  She reports home cpap machine has broken for two months   She does not want voltaren gel anymore, left thigh/leg pain has improved   Assessment & Plan:   Principal Problem:   Hypotension Active Problems:   H/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   Sleep apnea   Chronic diastolic CHF (congestive heart failure) (HCC)   Weakness   Depression   AKI (acute kidney injury) ([place])   Supratherapeutic INR   Presyncope/weakness secondary to hypotension in the setting Of Orthostatic Hypotension -Suspect that patient is over diuresed with her torsemide causing her to be volume depleted and  hypotensive -Home medication metoprolol held since admission ,she received IV hydration, -Blood pressure start to trend up, resumed metoprolol  Repeat orthostatic vital signs has improved   Visual distortion/ room tilted downwards -mri brain no acute findings - orthostatic vital signs has improved  -Vestibular assessment did not suggest BPPV -ocular seizure/migraine?,   neurology consulted , input appreciated , detail please refer to neurology note  Addendum: per communication with neurology Dr Quinn Axe, hospital delirium is likely, if symptom does not improve after discharge, will need to reeval by neurology on outpatient basis Could be Wellbutrin side effect Sleep disturbance/noncompliance with CPAP could also contribute  Neurology will reassess patient on Monday, plan to discharge on Monday after neurology assessment, both patient/husband made aware, currently in agreement with the plan.  Leukocytosis Reactive from larger bruises on left thigh/leg? Or poor oral intake ? Repeat ua unremarkable, f/u on repeat blood culture,  she does not appear septic, no cough, no fever, denies dysuria   AKI on CKD 2 -Likely prerenal due to hypotension and dehydration -Dirty UA probably not a clean-catch, urine culture no significant growth -Creatinine is back to baseline after hydration, holding Demadex   Hypokalemia, replaced  Left thigh pain/bruising -Patient does has extensive bruising of the left knee around left hamstring,  -Multiple imaging including left lower extremity soft tissue ultrasound , CT of left femur, CT of left knee, CT  abdomen/plevis done - suspect left proximal thigh intramuscular hematoma ,  a trace volume left retroperitoneal hemorrhage, -left lower extremity ultrasound ultrasound and CT of her knee did not show any obvious hematoma -monitor hgb    Normocytic anemia -Hemoglobin on presentation was 17, likely due to dehydration hemoconcentration, her hemoglobin likely at  baseline around 14 -Hgb nadir at 7.3, received 1 unit PRBC on 7/21 -FOBT ordered x3,  secure chat messages sent to RNx3 , awaiting for collection -Patient reports stool is brown, hgb has been stable the last few days -monitor hgb  Supratherapeutic INR with history of aortic stenosis status post AVR -INR 6.4 on admission, Coumadin was held initially, resumed on 7/19 -monitor INR  appreciate pharmacy assistance with Coumadin dosing   Hyperlipidemia -Continue with Pravastatin 40 mg p.o. daily  Hypertension Presented with hypotension Lopressor and Demadex held initially Blood pressure start to increase, restarted Lopressor, continue hole demadex, she does not appear to have volume overload  Chronic diastolic CHF Presented with dehydration/hypotension Lopressor and Demadex held initially, restarted lopressor, continue hold demadex She received IV hydration Monitor volume status  Chronic left lower extremity lymphedema Continue Ace wrap  Depression -Continue Bupropion and Cymbalta, reports recently started two months ago, patient has greatly benefited it -neurology recommend holding bupropion due to visual hallucination    OSA/class III obesity  -reports home cpap was broken for two months, reports she used hospital cpap the last three nights The patient's BMI is: Body mass index is 46.99 kg/m.Marland Kitchen    FTT , needs home health      Unresulted Labs (From admission, onward)     Start     Ordered   04/01/21 0500  CBC  Daily,   R     Question:  Specimen collection method  Answer:  Lab=Lab collect   03/30/21 1918   03/25/21 0500  Protime-INR  Daily,   R     Question:  Specimen collection method  Answer:  Lab=Lab collect   03/24/21 [id number]   Unscheduled  Occult blood card to lab, stool  As needed,   R      03/26/21 0830   Unscheduled  Occult blood card to lab, stool  As needed,   R      03/30/21 1918              DVT prophylaxis: Place TED hose Start: 03/24/21 1059 SCDs  Start: 03/22/21 2137 warfarin (COUMADIN) tablet 1.25 mg   Code Status: Full Family Communication: Husband and  daughter at bedside on 7/23, husband over the phone on 7/24 Disposition:   Status is: Inpatient     Dispo: The patient is from: Home              Anticipated d/c is to: Home health              Anticipated d/c date is: f/u on repeat blood culture,  hopefully can discharge on Monday after neurology reeval            Consultants:   neurology   Procedures:  None  Antimicrobials:   Anti-infectives (From admission, onward)    None           Objective: Vitals:   03/31/21 1730 03/31/21 1735 03/31/21 2045 04/01/21 0411  BP: (!) 197/91 (!) 168/85 129/78 (!) 122/91  Pulse: 78 75 87 70  Resp: '19 18 18 19  '$ Temp:   98.4 F (36.9 C) 98.2 F (36.8 C)  TempSrc:   Oral Oral  SpO2: 95% 94% 98% 100%  Weight:  112.8 kg  Height:        Intake/Output Summary (Last 24 hours) at 04/01/2021 1016 Last data filed at 04/01/2021 0850 Gross per 24 hour  Intake 843 ml  Output 1300 ml  Net -457 ml   Filed Weights   03/30/21 0616 03/31/21 0621 04/01/21 0411  Weight: 113.7 kg 112.4 kg 112.8 kg    Examination:  General exam: calm, NAD, no nystagmus, aaox3 Respiratory system:  CTABL, Respiratory effort normal. Cardiovascular system: RRR. + murmur right upper sternal border Gastrointestinal system: Abdomen is nondistended, soft and nontender.+ bs Central nervous system: Alert and oriented. No focal neurological deficits. Extremities: left thigh/knee ecchymosis , Chronic lymphedema left lower extremity, wrapped with Ace wrap, right lower extremity with TED hose, no edema Skin: No rashes, lesions or ulcers Psychiatry: Judgement and insight appear normal. Mood & affect appropriate.     Data Reviewed: I have personally reviewed following labs and imaging studies  CBC: Recent Labs  Lab 03/26/21 0001 03/27/21 0020 03/27/21 1505 03/28/21 0051 03/29/21 0520  03/30/21 0044 03/31/21 0042 04/01/21 0042  WBC 9.0 10.1 9.7 12.0* 11.7* 13.8* 12.3* 14.0*  NEUTROABS 5.6 6.5 6.8 8.1*  --   --  8.5*  --   HGB 7.9* 7.5* 7.7* 7.3* 8.7* 8.8* 8.7* 10.1*  HCT 25.0* 23.9* 24.4* 23.3* 27.2* 27.8* 27.9* 32.5*  MCV 89.6 89.5 88.7 89.3 90.1 90.0 89.7 90.3  PLT 178 201 200 225 245 281 321 411*    Basic Metabolic Panel: Recent Labs  Lab 03/26/21 0001 03/27/21 0020 03/28/21 0051 03/29/21 0520 03/30/21 0044 03/31/21 0042  NA 136 137 135 138 135 135  K 3.8 4.0 4.1 4.0 4.0 4.0  CL 103 105 105 108 105 105  CO2 '26 29 26 27 24 23  '$ GLUCOSE 116* 94 115* 103* 100* 88  BUN 11 <5* 6* '8 8 10  '$ CREATININE 1.02* 0.75 0.76 0.84 0.74 0.75  CALCIUM 8.9 9.0 9.0 9.3 9.3 9.4  MG 2.0 2.1 2.1  --   --  2.1  PHOS 2.8 2.7 3.1 4.2 3.3  --     GFR: Estimated Creatinine Clearance: 80.6 mL/min (by C-G formula based on SCr of 0.75 mg/dL).  Liver Function Tests: Recent Labs  Lab 03/26/21 0001 03/27/21 0020 03/28/21 0051 03/29/21 0520 03/30/21 0044 03/31/21 0042  AST 28 40 30  --   --  17  ALT '17 20 19  '$ --   --  15  ALKPHOS 39 50 51  --   --  48  BILITOT 0.8 1.1 1.4*  --   --  1.8*  PROT 5.3* 5.2* 5.8*  --   --  6.1*  ALBUMIN 2.5* 2.3* 2.5* 2.6* 2.7* 2.7*    CBG: Recent Labs  Lab 03/28/21 0624 03/29/21 0617 03/30/21 0610 03/31/21 0614 04/01/21 0615  GLUCAP 96 106* 98 87 88     Recent Results (from the past 240 hour(s))  Urine Culture     Status: Abnormal   Collection Time: 03/22/21  6:56 PM   Specimen: Urine, Clean Catch  Result Value Ref Range Status   Specimen Description URINE, CLEAN CATCH  Final   Special Requests NONE  Final   Culture (A)  Final    10,000 COLONIES/mL LACTOBACILLUS SPECIES Standardized susceptibility testing for this organism is not available. Performed at [facility], [place] [address] [place], [place] [address]    Report Status 03/24/2021 FINAL  Final  SARS CORONAVIRUS 2 (TAT 6-24 HRS) Nasopharyngeal Nasopharyngeal Swab  Status: None   Collection Time: 03/22/21  9:55 PM   Specimen: Nasopharyngeal Swab  Result Value Ref Range Status   SARS Coronavirus 2 NEGATIVE NEGATIVE Final    Comment: (NOTE) SARS-CoV-2 target nucleic acids are NOT DETECTED.  The SARS-CoV-2 RNA is generally detectable in upper and lower respiratory specimens during the acute phase of infection. Negative results do not preclude SARS-CoV-2 infection, do not rule out co-infections with other pathogens, and should not be used as the sole basis for treatment or other patient management decisions. Negative results must be combined with clinical observations, patient history, and epidemiological information. The expected result is Negative.  Fact Sheet for Patients: SugarRoll.be  Fact Sheet for Healthcare Providers: https://www.woods-mathews.com/  This test is not yet approved or cleared by the [place] FDA and  has been authorized for detection and/or diagnosis of SARS-CoV-2 by FDA under an Emergency Use Authorization (EUA). This EUA will remain  in effect (meaning this test can be used) for the duration of the COVID-19 declaration under Se ction 564(b)(1) of the Act, 21 U.S.C. section 360bbb-3(b)(1), unless the authorization is terminated or revoked sooner.  Performed at [facility], [place] [address] [place], [place] [address]   MRSA Next Gen by PCR, Nasal     Status: Abnormal   Collection Time: 03/23/21  1:32 AM   Specimen: Nasal Mucosa; Nasal Swab  Result Value Ref Range Status   MRSA by PCR Next Gen DETECTED (A) NOT DETECTED Final    Comment: RESULT CALLED TO, READ BACK BY AND VERIFIED WITH: A. TEPE,RN 0430 03/23/2021 T. TYSOR (NOTE) The GeneXpert MRSA Assay (FDA approved for NASAL specimens only), is one component of a comprehensive MRSA colonization surveillance program. It is not intended to diagnose MRSA infection nor to guide or monitor treatment for MRSA  infections. Test performance is not FDA approved in patients less than 28 years old. Performed at [facility], [place] [address], [place], [place] [address]          Radiology Studies: No results found.      Scheduled Meds:  sodium chloride   Intravenous Once   buPROPion  150 mg Oral Daily   diclofenac Sodium  2 g Topical QID   DULoxetine  60 mg Oral Daily   ferrous sulfate  325 mg Oral Q breakfast   folic acid  1 mg Oral Daily   metoprolol tartrate  25 mg Oral BID   oxybutynin  5 mg Oral QPM   polyethylene glycol  17 g Oral BID   pravastatin  40 mg Oral q1800   senna-docusate  1 tablet Oral BID   sodium chloride flush  3 mL Intravenous Q12H   vitamin B-12  1,000 mcg Oral Daily   warfarin  1.25 mg Oral ONCE-1600   Warfarin - Pharmacist Dosing Inpatient   Does not apply q1600   Continuous Infusions:   LOS: 9 days   Time spent: 78mns Greater than 50% of this time was spent in counseling, explanation of diagnosis, planning of further management, and coordination of care.   Voice Recognition /Viviann Sparedictation system was used to create this note, attempts have been made to correct errors. Please contact the author with questions and/or clarifications.   FFlorencia Reasons MD PhD FACP [facility]  Available via Epic secure chat 7am-7pm for nonurgent issues Please page for urgent issues To page the attending provider between 7A-7P or the covering provider during after hours 7P-7A, please log into the web  site [email or web address] and access using universal [place] password for that web site. If you do not have the password, please call the hospital operator.    04/01/2021, 10:16 AM

## 2021-04-01 NOTE — Consult Note (Signed)
NEUROLOGY CONSULTATION NOTE   Date of service: April 01, 2021 Patient Name: [name] MRN:  [id number] DOB:  07-02-1956 Reason for consult: "visual disturbance" Requesting Provider: Florencia Reasons, MD _ _ _   _ __   _ __ _ _  __ __   _ __   __ _  History of Present Illness  [name] is a 65 y.o. female with PMH significant for anemia, CAD, HTN, CHF, aortic stenosis s/p TAVR on warfarin, chronic diastolic CHF, prior TIA, OSA on Bipap who is admitted with presyncopal episodes and 3 falls. She was admitted for suspected orthostatic episodes thought to be secondary to overdiuresis. Other ative issues include elevated WBC, elevated Cr concerning for an AKI and supratherapeutic INR al improving. She has been in the hospital for 9 days now. Over the last 2-3 days, she has been reporting well formed visual distortions which only last 3-4 mins and then resolve. She felt that she was tilted and the wall in front of her was the floor and the wall and the ceiling was a wall. Another episode where she felt that the flowers in her room were tilted. Does not repot any dizziness or spinning during these events. Another episode where she felt like she was seeing things through a wax paper and things were not well differentiated and like everything as a blur. She also describes another episode where she felt like she was trying to planter flex and someone was trying to push her R foot. This was involuntary. While I was in the room, she felt that there was a piece of trim from the wall suspended in air but it is transparent. This lasted maybe a minute at the most and resolved.  She has sleep apnea and has difficulty with the CPAP mask here and the one she got from home, the band broke. Has not slept well in the hospital. Frustrated at being woken up every few hours. She also reports migraine headaches but never had anything like this in the past. No prior hx of seizures. She was started on Bupropion a about 4 weeks ago  and her Duloxetine dose was bumped up  MRI brain was obtained and was negative for any acute abnormality.  We were asked for assistance with further workup.   ROS   Constitutional Denies weight loss, fever and chills.   HEENT Denies changes in vision and hearing.   Respiratory Denies SOB and cough.   CV Denies palpitations and CP   GI Denies abdominal pain, nausea, vomiting and diarrhea.   GU Denies dysuria and urinary frequency.   MSK Denies myalgia and joint pain.   Skin Denies rash and pruritus.   Neurological Denies headache and syncope.   Psychiatric Denies recent changes in mood. Denies anxiety and depression.    Past History   Past Medical History:  Diagnosis Date   Anemia 06/03/2019   Aortic stenosis 03/23/2013   Overview:  02/18/13 TTE EF >55%. Critical AS with mean Ao valve gradient of 82 mm Hg. No AI. No MR, PR, mild TR. Estimated RVSP 30 mm Hg.   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 Postmenopausal bleeding since for the past 2 years.  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 Last biopsy was in May of 2019 which showed weakly proliferative endometrium possibly a polyp.     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   Past Surgical History:  Procedure Laterality Date   BUBBLE STUDY  06/07/2019   Procedure: BUBBLE STUDY;  Surgeon: Buford Dresser, MD;  Location: [facility];  Service: Cardiovascular;;   CHOLECYSTECTOMY  1983   RIGHT/LEFT HEART CATH AND CORONARY ANGIOGRAPHY N/A 04/01/2019   Procedure: RIGHT/LEFT HEART CATH AND CORONARY ANGIOGRAPHY;  Surgeon: Lorretta Harp, MD;  Location: [facility];  Service: Cardiovascular;  Laterality: N/A;   TEE WITHOUT CARDIOVERSION N/A 07/01/2018   Procedure: TRANSESOPHAGEAL ECHOCARDIOGRAM (TEE);  Surgeon: Buford Dresser, MD;  Location: [facility] ENDOSCOPY;  Service: Cardiovascular;  Laterality: N/A;   TEE WITHOUT CARDIOVERSION N/A 06/07/2019   Procedure: TRANSESOPHAGEAL ECHOCARDIOGRAM (TEE);  Surgeon: Buford Dresser, MD;  Location: [facility] ENDOSCOPY;  Service: Cardiovascular;  Laterality: N/A;   TISSUE AORTIC VALVE REPLACEMENT     Family History  Problem Relation Age of Onset   Parkinson's disease Father    Heart attack Mother    Social History   Socioeconomic History   Marital status: Married    Spouse name: Not on file   Number of children: Not on file   Years of education: Not on file   Highest education level: Not on file  Occupational History   Not on file  Tobacco Use   Smoking status: Never   Smokeless tobacco: Never  Vaping Use   Vaping Use: Never used  Substance and Sexual Activity   Alcohol use: No   Drug use: No   Sexual activity: Not Currently  Other Topics Concern   Not on file  Social History Narrative   Not on file   Social Determinants of Health   Financial Resource Strain: Not on file  Food Insecurity: Not  on file  Transportation Needs: Not on file  Physical Activity: Not on file  Stress: Not on file  Social Connections: Not on file   Allergies  Allergen Reactions   Vancomycin Itching    Patient can receive vancomycin with slower infusion and prn benadryl for itching     Medications   Medications Prior to Admission  Medication Sig Dispense Refill Last Dose   acetaminophen (TYLENOL) 325 MG tablet Take 2 tablets (650 mg total) by mouth every 6 (six) hours as needed for mild pain or headache. 30 tablet  Past Month   buPROPion (WELLBUTRIN XL) 150 MG 24 hr tablet Take 150 mg by mouth daily.   03/22/2021   camphor-menthol (SARNA) lotion Apply 1 application topically as needed for itching. 222 mL 0 Past Month   DULoxetine (CYMBALTA) 60 MG capsule Take 60 mg by  mouth daily.   03/22/2021   Ferrous Sulfate Dried (HIGH POTENCY IRON) 65 MG TABS Take 1 tablet by mouth daily.   03/22/2021   metoprolol succinate (TOPROL-XL) 25 MG 24 hr tablet Take 25-50 mg by mouth See admin instructions. Take two of the 25 mg (total of 50 mg) tablets by mouth in the morning and then take one 25 mg tablet by mouth in the evening per patient   03/22/2021 at 0800   naproxen (NAPROSYN) 250 MG tablet Take 250 mg by mouth 2 (two) times daily as needed for mild pain.   03/22/2021   oxybutynin (DITROPAN-XL) 5 MG 24 hr tablet Take 5 mg by mouth every evening.   03/21/2021   potassium chloride (KLOR-CON) 10 MEQ tablet Take 10 mEq by mouth 2 (two) times daily.   03/22/2021   pravastatin (PRAVACHOL) 40 MG tablet Take 1 tablet (40 mg total) by mouth daily at 6 PM. 30 tablet 0 03/21/2021   torsemide (DEMADEX) 20 MG tablet Take 3 tablets (60 mg total) by mouth daily. 90 tablet 3 03/21/2021   warfarin (COUMADIN) 2.5 MG tablet Take 1 tablet by mouth once daily (Patient taking differently: Take 2.5 mg MWF, and then take half all other days) 60 tablet 0 03/19/2021 at 0900   levonorgestrel (MIRENA) 20 MCG/24HR IUD 1 each by Intrauterine route once.    unknown at unknown     Vitals   Vitals:   03/31/21 1549 03/31/21 1730 03/31/21 1735 03/31/21 2045  BP: 97/83 (!) 197/91 (!) 168/85 129/78  Pulse: 74 78 75 87  Resp: '16 19 18 18  '$ Temp: 98.4 F (36.9 C)   98.4 F (36.9 C)  TempSrc: Oral   Oral  SpO2: 96% 95% 94% 98%  Weight:      Height:         Body mass index is 46.82 kg/m.  Physical Exam   General: Laying comfortably in bed; in no acute distress.  HENT: Normal oropharynx and mucosa. Normal external appearance of ears and nose.  Neck: Supple, no pain or tenderness  CV: No JVD. No peripheral edema.  Pulmonary: Symmetric Chest rise. Normal respiratory effort.  Abdomen: Soft to touch, non-tender.  Ext: No cyanosis, edema, or deformity  Skin: No rash. Normal palpation of skin.   Musculoskeletal: Normal digits and nails by inspection. No clubbing.   Neurologic Examination  Mental status/Cognition: Alert, oriented to self, place, month and year, good attention.  Speech/language: Fluent, comprehension intact, object naming intact, repetition intact.  Cranial nerves:   CN II Pupils equal and reactive to light, no VF deficits    CN III,IV,VI EOM intact, no gaze preference or deviation, no nystagmus    CN V normal sensation in V1, V2, and V3 segments bilaterally    CN VII no asymmetry, no nasolabial fold flattening    CN VIII normal hearing to speech    CN IX & X normal palatal elevation, no uvular deviation    CN XI 5/5 head turn and 5/5 shoulder shrug bilaterally   CN XII midline tongue protrusion    Motor:  Muscle bulk: normal, tone normal, pronator drift none tremor none Mvmt Root Nerve  Muscle Right Left Comments  SA C5/6 Ax Deltoid 5 5   EF C5/6 Mc Biceps 5 5   EE C6/7/8 Rad Triceps 5 5   WF C6/7 Med FCR     WE C7/8 PIN ECU     F Ab C8/T1 U ADM/FDI 5 5   HF  L1/2/3 Fem Illopsoas 5 4 Pain in L hip and thigh from fall.  KE L2/3/4 Fem Quad 5 5   DF L4/5 D Peron Tib Ant 5 5   PF S1/2 Tibial Grc/Sol 5 5     Reflexes:  Right Left Comments  Pectoralis      Biceps (C5/6) 2 2   Brachioradialis (C5/6) 2 2    Triceps (C6/7) 2 2    Patellar (L3/4) 0 0    Achilles (S1) 1 1    Hoffman      Plantar     Jaw jerk    Sensation:  Light touch intact   Pin prick    Temperature    Vibration   Proprioception    Coordination/Complex Motor:  - Finger to Nose intact BL - Heel to shin intact BL - Rapid alternating movement are normal - Gait: unsafe to assess given her body habitus.  Labs   CBC:  Recent Labs  Lab 03/28/21 0051 03/29/21 0520 03/31/21 0042 04/01/21 0042  WBC 12.0*   < > 12.3* 14.0*  NEUTROABS 8.1*  --  8.5*  --   HGB 7.3*   < > 8.7* 10.1*  HCT 23.3*   < > 27.9* 32.5*  MCV 89.3   < > 89.7 90.3  PLT 225   < > 321 411*   < > = values in this interval not displayed.    Basic Metabolic Panel:  Lab Results  Component Value Date   NA 135 03/31/2021   K 4.0 03/31/2021   CO2 23 03/31/2021   GLUCOSE 88 03/31/2021   BUN 10 03/31/2021   CREATININE 0.75 03/31/2021   CALCIUM 9.4 03/31/2021   GFRNONAA >60 03/31/2021   GFRAA 74 06/06/2020   Lipid Panel:  Lab Results  Component Value Date   LDLCALC 71 06/04/2019   HgbA1c:  Lab Results  Component Value Date   HGBA1C 6.1 (H) 09/09/2020   Urine Drug Screen: No results found for: LABOPIA, COCAINSCRNUR, LABBENZ, AMPHETMU, THCU, LABBARB  Alcohol Level No results found for: [facility]  MRI Brain: 1. No acute intracranial abnormality. 2. Generalized volume loss and findings of chronic microvascular ischemia.  Impression   [name] is a 65 y.o. female with PMH significant for anemia, CAD, HTN, CHF, aortic stenosis s/p TAVR on warfarin, chronic diastolic CHF, prior TIA, OSA on Bipap who is admitted with presyncopal episodes and 3 falls. Beein in ths hospital for 9+ days and over the last 3 days having random well formed hallucinations. Her neurologic examination is notable for no focal deficit.  Suspect that these are  likely due to medication. Most likely from Bupropion which is known to cause hallucinations in a small number of people. I do think that her not wearing CPAP overnight and poor sleep quality during hospitalization from being woken up in the middle of night is not helping either.  Stroke is unlikely to be a positive phenomena and so I do not think these are strokes. Also she has no focal deficit. Unlikely to be seizures as seizures arevery stereotyped and she is experiencing random hallucinations. Migraine presents with aura that can be confused as hallucinations but usually these are not well formed hallucinations unlike hers.  Recommendations  - Stop Bupropion and monitor for resolution of Hallucinations - Recommend Delirium precautions and if safe, recommend minimizing vitals and blood draws overnight to promote rest and recommend using CPAP during night. ___________________________________________________________________   Thank you for the opportunity to take part  in the care of this patient. If you have any further questions, please contact the neurology consultation attending.  Signed,  [place] Pager Number [phone] _ _ _   _ __   _ __ _ _  __ __   _ __   __ _

## 2021-04-01 NOTE — Progress Notes (Signed)
Pt's husband spoke with RN in the hallway and expressed concerns about his wife having visual hallucinations. He said, "she was grabbing things out of the air that weren't there and was talking about seeing Christmas ornaments and toys. He also expressed concerns about his wife "suddenly stopping Wellbutrin", since the medication has been d/c'd. MD notified. RN continuing to monitor.

## 2021-04-02 ENCOUNTER — Encounter (HOSPITAL_COMMUNITY): Payer: Self-pay | Admitting: Internal Medicine

## 2021-04-02 DIAGNOSIS — E860 Dehydration: Secondary | ICD-10-CM | POA: Diagnosis not present

## 2021-04-02 DIAGNOSIS — I9589 Other hypotension: Secondary | ICD-10-CM | POA: Diagnosis not present

## 2021-04-02 DIAGNOSIS — I5032 Chronic diastolic (congestive) heart failure: Secondary | ICD-10-CM | POA: Diagnosis not present

## 2021-04-02 DIAGNOSIS — R443 Hallucinations, unspecified: Secondary | ICD-10-CM | POA: Diagnosis not present

## 2021-04-02 DIAGNOSIS — N179 Acute kidney failure, unspecified: Secondary | ICD-10-CM | POA: Diagnosis not present

## 2021-04-02 LAB — CBC
HCT: 29.9 % — ABNORMAL LOW (ref 36.0–46.0)
Hemoglobin: 9.1 g/dL — ABNORMAL LOW (ref 12.0–15.0)
MCH: 27.9 pg (ref 26.0–34.0)
MCHC: 30.4 g/dL (ref 30.0–36.0)
MCV: 91.7 fL (ref 80.0–100.0)
Platelets: 362 10*3/uL (ref 150–400)
RBC: 3.26 MIL/uL — ABNORMAL LOW (ref 3.87–5.11)
RDW: 15.9 % — ABNORMAL HIGH (ref 11.5–15.5)
WBC: 13.4 10*3/uL — ABNORMAL HIGH (ref 4.0–10.5)
nRBC: 0 % (ref 0.0–0.2)

## 2021-04-02 LAB — GLUCOSE, CAPILLARY: Glucose-Capillary: 103 mg/dL — ABNORMAL HIGH (ref 70–99)

## 2021-04-02 LAB — PROTIME-INR
INR: 2.8 — ABNORMAL HIGH (ref 0.8–1.2)
Prothrombin Time: 29.5 seconds — ABNORMAL HIGH (ref 11.4–15.2)

## 2021-04-02 MED ORDER — CYANOCOBALAMIN 1000 MCG PO TABS: 1000.0000 ug | ORAL_TABLET | Freq: Every day | ORAL | Status: AC

## 2021-04-02 MED ORDER — WARFARIN 1.25 MG HALF TABLET
1.2500 mg | ORAL_TABLET | Freq: Once | ORAL | Status: DC
  Filled 2021-04-02: qty 1

## 2021-04-02 MED ORDER — SENNOSIDES-DOCUSATE SODIUM 8.6-50 MG PO TABS: 1.0000 | ORAL_TABLET | Freq: Two times a day (BID) | ORAL | Status: DC

## 2021-04-02 MED ORDER — FOLIC ACID 1 MG PO TABS: 1.0000 mg | ORAL_TABLET | Freq: Every day | ORAL | Status: AC

## 2021-04-02 NOTE — Plan of Care (Signed)
  Problem: Education: Goal: Knowledge of General Education information will improve Description: Including pain rating scale, medication(s)/side effects and non-pharmacologic comfort measures 04/02/2021 1424 by Leonie Man, RN Outcome: Adequate for Discharge 04/02/2021 0915 by Leonie Man, RN Outcome: Progressing   Problem: Health Behavior/Discharge Planning: Goal: Ability to manage health-related needs will improve 04/02/2021 1424 by Leonie Man, RN Outcome: Adequate for Discharge 04/02/2021 0915 by Leonie Man, RN Outcome: Progressing   Problem: Clinical Measurements: Goal: Ability to maintain clinical measurements within normal limits will improve 04/02/2021 1424 by Leonie Man, RN Outcome: Adequate for Discharge 04/02/2021 0915 by Leonie Man, RN Outcome: Progressing Goal: Will remain free from infection 04/02/2021 1424 by Leonie Man, RN Outcome: Adequate for Discharge 04/02/2021 0915 by Leonie Man, RN Outcome: Progressing Goal: Diagnostic test results will improve 04/02/2021 1424 by Leonie Man, RN Outcome: Adequate for Discharge 04/02/2021 0915 by Leonie Man, RN Outcome: Progressing Goal: Respiratory complications will improve 04/02/2021 1424 by Leonie Man, RN Outcome: Adequate for Discharge 04/02/2021 0915 by Leonie Man, RN Outcome: Progressing Goal: Cardiovascular complication will be avoided 04/02/2021 1424 by Leonie Man, RN Outcome: Adequate for Discharge 04/02/2021 0915 by Leonie Man, RN Outcome: Progressing   Problem: Activity: Goal: Risk for activity intolerance will decrease 04/02/2021 1424 by Leonie Man, RN Outcome: Adequate for Discharge 04/02/2021 0915 by Leonie Man, RN Outcome: Progressing   Problem: Nutrition: Goal: Adequate nutrition will be maintained 04/02/2021 1424 by Leonie Man, RN Outcome: Adequate for Discharge 04/02/2021 0915 by Leonie Man, RN Outcome: Progressing   Problem: Coping: Goal: Level of anxiety  will decrease 04/02/2021 1424 by Leonie Man, RN Outcome: Adequate for Discharge 04/02/2021 0915 by Leonie Man, RN Outcome: Progressing   Problem: Elimination: Goal: Will not experience complications related to bowel motility 04/02/2021 1424 by Leonie Man, RN Outcome: Adequate for Discharge 04/02/2021 0915 by Leonie Man, RN Outcome: Progressing Goal: Will not experience complications related to urinary retention 04/02/2021 1424 by Leonie Man, RN Outcome: Adequate for Discharge 04/02/2021 0915 by Leonie Man, RN Outcome: Progressing   Problem: Pain Managment: Goal: General experience of comfort will improve 04/02/2021 1424 by Leonie Man, RN Outcome: Adequate for Discharge 04/02/2021 0915 by Leonie Man, RN Outcome: Progressing   Problem: Safety: Goal: Ability to remain free from injury will improve 04/02/2021 1424 by Leonie Man, RN Outcome: Adequate for Discharge 04/02/2021 0915 by Leonie Man, RN Outcome: Progressing   Problem: Skin Integrity: Goal: Risk for impaired skin integrity will decrease 04/02/2021 1424 by Leonie Man, RN Outcome: Adequate for Discharge 04/02/2021 0915 by Leonie Man, RN Outcome: Progressing

## 2021-04-02 NOTE — Progress Notes (Addendum)
Neurology Progress Note  Brief HPI: 65 y.o. female with PMHx of anemia, CAD, HTN, CHF, aortic stenosis s/p TAVR on coumadin, chronic diastolic CHF, TIA, OSA of BiPAP who was initially admitted 03/22/2021 for evaluation of presyncope and 3 falls at home presumed to be 2/2 over-diuresis with findings of leukocytosis, elevated Cr, and supratherapeutic INR but MRI brain without acute abnormality. Prolonged hospitalization complicated by well-formed visual hallucinations/distortions for 3-4 days resolving within 3-4 minutes. Approximately 4 weeks ago, patient's duloxetine dose was increased and bupropion was initiated. Patient endorses poor sleep while inpatient and difficulty with her home BiPAP mask.  Subjective: No acute overnight events Visual disturbances continue without improvement or progression Patient with concern that Voltaren cream may be contributing to visual disturbance, cream d/c on 04/01/2021 Patient endorses not being on medication for depression for approximately 6 years. She was initiated on Cymbalta 30 mg approximately 7 weeks ago with an increase to 60 mg 4 weeks ago with initiation of bupropion.  Exam: Vitals:   04/01/21 2342 04/02/21 0342  BP: 131/68 131/71  Pulse: 80 70  Resp: 17 (!) 21  Temp: 98.4 F (36.9 C) 98.3 F (36.8 C)  SpO2: 95% 93%   Gen: Sitting at bedside in recliner eating breakfast, she is in no acute distress Resp: non-labored breathing, no respiratory distress, on room air Abd: soft, non-tender, non-distended  Neuro: Mental Status: Awake, alert, and oriented to self, time, place, and situation. Good attention.  She is able to provide a clear and coherent history of present illness Speech is intact without dysarthria. Naming, fluency, and comprehension are intact without aphasia.  No neglect is noted.  Cranial Nerves: PERRL, EOMI without ptosis, visual fields are full, facial sensation to light touch is intact and symmetric, face is symmetric resting and  smiling, hearing is intact to voice, phonation is normal, palate rises symmetrically, shoulder shrug is symmetric, tongue protrudes midline.  Motor: 5/5 strength present throughout with some pain limitation to left hip from fall at home. Tone and bulk are normal. No vertical drift noted throughout.  Sensory: Intact and symmetric to light touch in bilateral upper and lower extremities.  DTR:  2+ and symmetric biceps and brachioradialis.  Gait: Deferred  Pertinent Labs: CBC    Component Value Date/Time   WBC 13.4 (H) 04/02/2021 0008   RBC 3.26 (L) 04/02/2021 0008   HGB 9.1 (L) 04/02/2021 0008   HGB 9.5 (L) 02/12/2019 1116   HCT 29.9 (L) 04/02/2021 0008   HCT 32.0 (L) 06/06/2019 0518   PLT 362 04/02/2021 0008   PLT 393 02/12/2019 1116   MCV 91.7 04/02/2021 0008   MCV 76 (L) 02/12/2019 1116   MCH 27.9 04/02/2021 0008   MCHC 30.4 04/02/2021 0008   RDW 15.9 (H) 04/02/2021 0008   RDW 18.2 (H) 02/12/2019 1116   LYMPHSABS 2.0 03/31/2021 0042   MONOABS 0.9 03/31/2021 0042   EOSABS 0.8 (H) 03/31/2021 0042   BASOSABS 0.1 03/31/2021 0042   CMP     Component Value Date/Time   NA 135 03/31/2021 0042   NA 140 06/06/2020 0903   K 4.0 03/31/2021 0042   CL 105 03/31/2021 0042   CO2 23 03/31/2021 0042   GLUCOSE 88 03/31/2021 0042   BUN 10 03/31/2021 0042   BUN 13 06/06/2020 0903   CREATININE 0.75 03/31/2021 0042   CALCIUM 9.4 03/31/2021 0042   PROT 6.1 (L) 03/31/2021 0042   PROT 7.2 02/12/2019 1116   ALBUMIN 2.7 (L) 03/31/2021 0042   ALBUMIN  3.7 (L) 02/12/2019 1116   AST 17 03/31/2021 0042   ALT 15 03/31/2021 0042   ALKPHOS 48 03/31/2021 0042   BILITOT 1.8 (H) 03/31/2021 0042   BILITOT 0.3 02/12/2019 1116   GFRNONAA >60 03/31/2021 0042   GFRAA 74 06/06/2020 0903   Urinalysis    Component Value Date/Time   COLORURINE YELLOW 04/01/2021 1656   APPEARANCEUR CLEAR 04/01/2021 1656   LABSPEC 1.015 04/01/2021 1656   PHURINE 8.0 04/01/2021 1656   GLUCOSEU NEGATIVE 04/01/2021 1656    HGBUR NEGATIVE 04/01/2021 1656   BILIRUBINUR NEGATIVE 04/01/2021 1656   KETONESUR NEGATIVE 04/01/2021 1656   PROTEINUR NEGATIVE 04/01/2021 1656   NITRITE NEGATIVE 04/01/2021 1656   LEUKOCYTESUR NEGATIVE 04/01/2021 1656   Drugs of Abuse  No results found for: LABOPIA, COCAINSCRNUR, LABBENZ, AMPHETMU, THCU, LABBARB   Lab Results  Component Value Date   TSH 1.908 03/23/2021   Lab Results  Component Value Date   VITAMINB12 263 03/26/2021   Imaging Reviewed:  MRI Brain wo Contrast 03/28/2021: 1. No acute intracranial abnormality. 2. Generalized volume loss and findings of chronic microvascular ischemia.   Assessment: 65 y.o. female who was admitted 03/22/2021 for evaluation of presyncopal episodes s/p 3 falls at home with prolonged hospital stay. Hospitalization complicated by 3-4 days of brief visual distortions lasting approximately 3-4 minutes each.  - Examination continues to reveal patient without focal neurologic deficit. Visual disturbances are persistent without progression or improvement. Wellbutrin held 04/01/2021 - Approximately 7 weeks ago patient was started on duloxetine and 4 weeks ago, patient was started on bupropion and her duloxetine dose was increased from 30 mg to 60 mg.  - Ddx includes side effect from medications: diclofenac sodium cream, bupropion, and increasing duloxetine with a small number of hallucinations reported with these medications with or without superimposing hospital delirium with frequent sleep disturbance and BiPAP complications at home. Low suspicion for seizures as seizures are stereotyped and her presentation is more consistent with hallucinations. Migraine headaches with aura can be confused with hallucinations but with her well formed hallucinations, this is less likely.  - B12 of 263 is low by neurological standards  Recommendations: - Continue holding bupropion - Discontinue Voltaren cream  - Psychiatry follow up for further management of  antidepressant medications - Delirium precautions:  - try to minimize deliriogenic medications as much as possible (J Am Geriatr Soc. 2012 Apr;60(4):616-31): benzodiazepines, anticholinergics, diphenhydramine, antihistamines, narcotics, Ambien/Lunesta/Sonata etc.  - Promote healthy sleep/wake cycles, ensure compliance with CPAP at night  -Start B12 supplementatio - Neurology will sign off. Please call if there are additional questions.  Anibal Henderson, AGACNP-BC [facility] [phone]  Electronically signed: Dr. Kerney Elbe

## 2021-04-02 NOTE — TOC Transition Note (Signed)
Transition of Care [facility]) - CM/SW Discharge Note   Patient Details  Name: [name] MRN: [id number] Date of Birth: [birth year]  [facility] CM/SW Contact:  Zenon Mayo, RN Phone Number: 04/02/2021, 2:40 PM   Clinical Narrative:    Patient is for dc today, NCM notified Dorian Pod with [facility].  Patient has no other needs.   Final next level of care: [place] Barriers to Discharge: No Barriers Identified   Patient Goals and CMS Choice Patient states their goals for this hospitalization and ongoing recovery are:: return home with [facility] CMS Medicare.gov Compare Post Acute Care list provided to:: Patient Choice offered to / list presented to : Patient  Discharge Placement                       Discharge Plan and Services   Discharge Planning Services: CM Consult Post Acute Care Choice: Home Health            DME Agency: NA       HH Arranged: PT HH Agency: [place] Date [place]: 04/01/21 Time [name]: [id number] Representative spoke with at [place]: [place] ([place]) Interventions     Readmission Risk Interventions No flowsheet data found.

## 2021-04-02 NOTE — Progress Notes (Signed)
ANTICOAGULATION CONSULT NOTE  Pharmacy Consult for warfarin Indication: atrial fibrillation  Allergies  Allergen Reactions   Vancomycin Itching    Patient can receive vancomycin with slower infusion and prn benadryl for itching     Patient Measurements: Height: '5\' 1"'$  (154.9 cm) Weight: 112.5 kg (248 lb 0.3 oz) (scale A) IBW/kg (Calculated) : 47.8   Vital Signs: Temp: 98.3 F (36.8 C) (07/25 0732) Temp Source: Oral (07/25 0732) BP: 131/68 (07/25 0932) Pulse Rate: 69 (07/25 0932)  Labs: Recent Labs    03/31/21 0042 04/01/21 0042 04/02/21 0008  HGB 8.7* 10.1* 9.1*  HCT 27.9* 32.5* 29.9*  PLT 321 411* 362  LABPROT 22.5* 25.0* 29.5*  INR 2.0* 2.3* 2.8*  CREATININE 0.75  --   --      Estimated Creatinine Clearance: 80.5 mL/min (by C-G formula based on SCr of 0.75 mg/dL).   Medical History: Past Medical History:  Diagnosis Date   Anemia 06/03/2019   Aortic stenosis 03/23/2013   Overview:  02/18/13 TTE EF >55%. Critical AS with mean Ao valve gradient of 82 mm Hg. No AI. No MR, PR, mild TR. Estimated RVSP 30 mm Hg.   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 Postmenopausal bleeding since for the past 2 years.  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 Last biopsy was in May of 2019 which showed weakly proliferative endometrium possibly a polyp.     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   Assessment: 65yof with HX bAVR/TIA (hx valve thrombosis) on warfarin pta 1.'25mg'$  TTSS and 2.'5mg'$  MWF. Admit INR >6, no reversal given. CT with trace L RP hemorrhage.   INR continues to be at goal at 2.8 (trend up), CBC stable  Goal of Therapy:  INR 2-3 Monitor platelets by anticoagulation protocol: Yes   Plan:  Warfarin 1.'25mg'$  tonight Daily INR for now  Hildred Laser, PharmD Clinical Pharmacist **Pharmacist phone directory can now be found on [email or web address] ([email or web address]).  Listed under [facility].

## 2021-04-02 NOTE — Progress Notes (Signed)
Occupational Therapy Treatment Patient Details Name: [name] MRN: [id number] DOB: 12-31-1955 Today's Date: 04/02/2021    History of present illness 65 y.o. female presenting to ED 7/14 with presyncopal episodes and recurrent falls x3 in 2 days. Patient admitted with presyncope/weakness secondary to hypotension and AKI. Labs (+) supratherapeutic INR w/hx of aortic valve repair. PMHx significant for aortic stenosis s/p TAVR on warfarin, chronic diastolic CHF, HTN, TIA, OSA on BiPAP.   OT comments  Pt completed basic ADL with set up to min assist. Educated in energy conservation. Pt denies any vision changes this visit.   Follow Up Recommendations  No OT follow up;Supervision - Intermittent (pt is declining HHOT)    Equipment Recommendations  3 in 1 bedside commode    Recommendations for Other Services      Precautions / Restrictions Precautions Precautions: Fall Restrictions Weight Bearing Restrictions: No       Mobility Bed Mobility               General bed mobility comments: in bed    Transfers Overall transfer level: Needs assistance Equipment used: 4-wheeled walker Transfers: Sit to/from Stand Sit to Stand: Supervision         General transfer comment: supervision for lines    Balance Overall balance assessment: Needs assistance Sitting-balance support: No upper extremity supported Sitting balance-Leahy Scale: Good Sitting balance - Comments: no LOB reaching to floor   Standing balance support: No upper extremity supported;Single extremity supported Standing balance-Leahy Scale: Fair Standing balance comment: statically while pulling up panties                           ADL either performed or assessed with clinical judgement   ADL Overall ADL's : Needs assistance/impaired     Grooming: Supervision/safety;Standing;Oral care;Applying deodorant;Sitting;Set up   Upper Body Bathing: Minimal assistance;Sitting Upper Body Bathing  Details (indicate cue type and reason): assisted for back     Upper Body Dressing : Set up;Sitting   Lower Body Dressing: Minimal assistance;Sit to/from stand Lower Body Dressing Details (indicate cue type and reason): assist for panties over feet Toilet Transfer: Supervision/safety;Ambulation;Regular Toilet;Grab bars   Toileting- Clothing Manipulation and Hygiene: Supervision/safety;Sit to/from stand         General ADL Comments: Educated in energy conservation strategies.     Vision       Perception     Praxis      Cognition Arousal/Alertness: Awake/alert Behavior During Therapy: WFL for tasks assessed/performed Overall Cognitive Status: Within Functional Limits for tasks assessed                                          Exercises     Shoulder Instructions       General Comments      Pertinent Vitals/ Pain       Pain Assessment: Faces Faces Pain Scale: Hurts a little bit Pain Location: L thigh Pain Descriptors / Indicators: Aching;Sore Pain Intervention(s): Monitored during session  Home Living                                          Prior Functioning/Environment              Frequency  Min 2X/week  Progress Toward Goals  OT Goals(current goals can now be found in the care plan section)  Progress towards OT goals: Progressing toward goals  Acute Rehab OT Goals Patient Stated Goal: return home OT Goal Formulation: With patient Time For Goal Achievement: 04/06/21 Potential to Achieve Goals: Good  Plan Frequency remains appropriate;Discharge plan needs to be updated    Co-evaluation                 AM-PAC OT "6 Clicks" Daily Activity     Outcome Measure   Help from another person eating meals?: None Help from another person taking care of personal grooming?: A Little Help from another person toileting, which includes using toliet, bedpan, or urinal?: A Little Help from another person  bathing (including washing, rinsing, drying)?: A Little Help from another person to put on and taking off regular upper body clothing?: None Help from another person to put on and taking off regular lower body clothing?: A Little 6 Click Score: 20    End of Session Equipment Utilized During Treatment: Rolling walker  OT Visit Diagnosis: Unsteadiness on feet (R26.81);Muscle weakness (generalized) (M62.81);Repeated falls (R29.6);Pain   Activity Tolerance Patient tolerated treatment well   Patient Left in chair;with call bell/phone within reach   Nurse Communication          Time: [id number] OT Time Calculation (min): 25 min  Charges: OT General Charges $OT Visit: 1 Visit OT Treatments $Self Care/Home Management : 23-37 mins  Nestor Lewandowsky, OTR/L Acute Rehabilitation Services Pager: [phone] Office: [phone]   Malka So 04/02/2021, 11:03 AM

## 2021-04-02 NOTE — Plan of Care (Signed)

## 2021-04-04 ENCOUNTER — Other Ambulatory Visit (HOSPITAL_COMMUNITY): Payer: Self-pay | Admitting: Cardiology

## 2021-04-06 NOTE — Discharge Summary (Addendum)
Physician Discharge Summary  [name] [id number] DOB: 12-13-1955 DOA: 03/22/2021  PCP: [name], MD  Admit date: 03/22/2021 Discharge date: 04/02/2021  Admitted From: Home  Disposition: Home.   Recommendations for Outpatient Follow-up:  Follow up with PCP in 1-2 weeks Please obtain BMP/CBC in one week Please follow up with neurology as outpatient.   Discharge Condition:stable.  CODE STATUS:Full code.  Diet recommendation: Heart Healthy    Brief/Interim Summary:  H/o aortic stenosis status post TAVR currently on anticoagulation with warfarin, chronic diastolic CHF, hypertension, TIA, history of obstructive sleep apnea on BiPAP presented to the hospital with presyncopal episodes.  She reports that for the last 2 to 3 days she has had falls 3 different times    In the ED she was noted to be hypotensive down to the 90s over 60s and had an elevated WBC of 20.3.  Creatinine was elevated from her baseline of 0.5 and she was admitted for an AKI.  Given her fall she had a head CT and was negative.  She was admitted for presyncope and weakness in the setting of hypotension and dehydration , aki likely suspected to overdiuresis with torsemide as well as supratherapeutic INR Discharge Diagnoses:  Principal Problem:   Hypotension Active Problems:   H/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   Sleep apnea   Chronic diastolic CHF (congestive heart failure) (HCC)   Weakness   Depression   AKI (acute kidney injury) ([place])   Supratherapeutic INR    Presyncope/weakness secondary to hypotension in the setting Of Orthostatic Hypotension -Suspect that patient is over diuresed with her torsemide causing her to be volume depleted and hypotensive - resolved.   Visual distortion/ room tilted downwards -mri brain no acute findings - orthostatic vital signs has improved -Vestibular assessment did not suggest BPPV  -Per communication with neurology Dr Quinn Axe, hospital delirium is  likely, if symptom does not improve after discharge, will need to reeval by neurology on outpatient basis Could be Wellbutrin side effect Sleep disturbance/noncompliance with CPAP could also contribute    Leukocytosis Reactive from larger bruises on left thigh/leg? Or poor oral intake ? Repeat ua unremarkable, f/u on repeat blood culture,  she does not appear septic, no cough, no fever, denies dysuria     AKI on CKD 2 -Likely prerenal due to hypotension and dehydration -Dirty UA probably not a clean-catch, urine culture no significant growth -Creatinine is back to baseline after hydration, holding Demadex   Hypokalemia, replaced   Left thigh pain/bruising -Patient does has extensive bruising of the left knee around left hamstring, -Multiple imaging including left lower extremity soft tissue ultrasound , CT of left femur, CT of left knee, CT  abdomen/plevis done - suspect left proximal thigh intramuscular hematoma , a trace volume left retroperitoneal hemorrhage, -left lower extremity ultrasound ultrasound and CT of her knee did not show any obvious hematoma -monitor hgb     Normocytic anemia -Hemoglobin on presentation was 17, likely due to dehydration hemoconcentration, her hemoglobin likely at baseline around 14 -Hgb nadir at 7.3, received 1 unit PRBC on 7/21 -FOBT ordered x3,  secure chat messages sent to RNx3 , awaiting for collection -Patient reports stool is brown, hgb has been stable the last few days -monitor hgb on discharge.    Supratherapeutic INR with history of aortic stenosis status post AVR INR improved.      Hyperlipidemia -Continue with Pravastatin 40 mg p.o. daily   Hypertension Presented with hypotension which has resolved.  Restarted home meds.    Chronic diastolic CHF Presented with dehydration/hypotension Currently euvomemic.    Chronic left lower extremity lymphedema Continue Ace wrap   Depression Holding bupropion as per neurology  recommendations, which might be contributing to er delirium .   try to minimize deliriogenic medications as much as possible  including. benzodiazepines, anticholinergics, diphenhydramine, antihistamines, narcotics, Ambien/Lunesta/Sonata etc.   OSA/class III obesity  -reports home cpap was broken for two months, reports she used hospital cpap the last three nights The patient's BMI is: Body mass index is 46.99 kg/m.Marland Kitchen  FTT , needs home health    Discharge Instructions  Discharge Instructions     Ambulatory referral to Neurology   Complete by: As directed    An appointment is requested in approximately: 1 week   Diet - low sodium heart healthy   Complete by: As directed    Discharge instructions   Complete by: As directed    Please follow up with PCP in one week .  Please follow up with Neurology in 1 to 2 weeks if symptoms persist.      Allergies as of 04/02/2021       Reactions   Vancomycin Itching   Patient can receive vancomycin with slower infusion and prn benadryl for itching         Medication List     STOP taking these medications    buPROPion 150 MG 24 hr tablet Commonly known as: WELLBUTRIN XL   levonorgestrel 20 MCG/24HR IUD Commonly known as: MIRENA   naproxen 250 MG tablet Commonly known as: NAPROSYN       TAKE these medications    acetaminophen 325 MG tablet Commonly known as: TYLENOL Take 2 tablets (650 mg total) by mouth every 6 (six) hours as needed for mild pain or headache.   cyanocobalamin 1000 MCG tablet Take 1 tablet (1,000 mcg total) by mouth daily.   DULoxetine 60 MG capsule Commonly known as: CYMBALTA Take 60 mg by mouth daily.   folic acid 1 MG tablet Commonly known as: FOLVITE Take 1 tablet (1 mg total) by mouth daily.   High Potency Iron 65 MG Tabs Take 1 tablet by mouth daily.   metoprolol succinate 25 MG 24 hr tablet Commonly known as: TOPROL-XL Take 25-50 mg by mouth See admin instructions. Take two of the 25 mg  (total of 50 mg) tablets by mouth in the morning and then take one 25 mg tablet by mouth in the evening per patient   oxybutynin 5 MG 24 hr tablet Commonly known as: DITROPAN-XL Take 5 mg by mouth every evening.   potassium chloride 10 MEQ tablet Commonly known as: KLOR-CON Take 10 mEq by mouth 2 (two) times daily.   pravastatin 40 MG tablet Commonly known as: PRAVACHOL Take 1 tablet (40 mg total) by mouth daily at 6 PM.   Sarna lotion Generic drug: camphor-menthol Apply 1 application topically as needed for itching.   senna-docusate 8.6-50 MG tablet Commonly known as: Senokot-S Take 1 tablet by mouth 2 (two) times daily.   warfarin 2.5 MG tablet Commonly known as: COUMADIN Take as directed. If you are unsure how to take this medication, talk to your nurse or doctor. Original instructions: Take 1 tablet by mouth once daily What changed:  how much to take how to take this when to take this additional instructions        Follow-up Information     Jacqlyn Larsen II, MD Follow up in 1 week(s).  Specialty: Family Medicine Why: hospital discharge follow up, repeat cbc/bmp at follow up Contact information: [place] [address] [phone]         Richardo Priest, MD .   Specialties: Cardiology, Radiology Contact information: [place] [address] [phone]         f/u with coumadin clinic Follow up.          [place] Follow up.   Specialty: Home Health Services Why: HHPT Contact information: [address] [place] [address] [phone]                Allergies  Allergen Reactions   Vancomycin Itching    Patient can receive vancomycin with slower infusion and prn benadryl for itching     Consultations: Neurology.    Procedures/Studies: CT ABDOMEN PELVIS WO CONTRAST  Result Date: 03/26/2021 CLINICAL DATA:  Anemia.  Soft tissue mass knee. EXAM: CT ABDOMEN AND PELVIS WITHOUT  CONTRAST TECHNIQUE: Multidetector CT imaging of the abdomen and pelvis was performed following the standard protocol without IV contrast. COMPARISON:  None. FINDINGS: Lower chest: Trace left pleural effusion. Hepatobiliary: No focal liver abnormality. Status post cholecystectomy. No biliary dilatation. Pancreas: No focal lesion. Normal pancreatic contour. No surrounding inflammatory changes. No main pancreatic ductal dilatation. Spleen: Normal in size without focal abnormality. Adrenals/Urinary Tract: No adrenal nodule bilaterally. No nephrolithiasis, no hydronephrosis. Bilateral fluid density lesions measuring up to 12.5 cm on the right and 9 cm on the left likely representing simple renal cysts. No ureterolithiasis or hydroureter. The urinary bladder is unremarkable. Stomach/Bowel: High density material within the gastric lumen likely ingested medication/food. Stomach is within normal limits. No evidence of bowel wall thickening or dilatation. Scattered colonic diverticulosis. Appendix appears normal. Vascular/Lymphatic: No abdominal aorta or iliac aneurysm.Mild atherosclerotic plaque. Asymmetrically prominent but nonenlarged left inguinal lymph nodes. Enlarged left pelvic sidewall 1.4 cm lymph node (1:69). Borderline enlarged left retroperitoneal lymph nodes (49, 1:54). Reproductive: T shaped intrauterine device noted within the uterus in grossly appropriate position. Otherwise uterus and bilateral adnexa are unremarkable. Other: Trace left retroperitoneal free fluid and fat stranding (1:42-55). No intraperitoneal free fluid. No intraperitoneal free gas. No organized fluid collection. Musculoskeletal: Asymmetrically enlarged left proximal thigh musculature compared to the right. Left proximal thigh superficial and deep subcutaneus soft tissue edema. No suspicious lytic or blastic osseous lesions. No acute displaced fracture. Levoscoliosis of the lumbar spine with associated multilevel degenerative changes of the  spine. IMPRESSION: 1. Suggestion of a trace volume left retroperitoneal hemorrhage. 2. Left proximal thigh superficial and deep subcutaneus soft tissue edema with asymmetrically enlarged left proximal thigh musculature compared to the right. Underlying intramuscular hematoma not excluded. No definite organized fluid collection with limited evaluation on this noncontrast study. 3. Left pelvic sidewall lymphadenopathy. 4. Trace left pleural effusion. 5. Large bilateral simple renal cysts measuring up to 12.5 cm on the right. 6.  Aortic Atherosclerosis (ICD10-I70.0). 7. Please note limited evaluation on this noncontrast study. These results will be called to the ordering clinician or representative by the Radiologist Assistant, and communication documented in the PACS or [employer]. Electronically Signed   By: Iven Finn M.D.   On: 03/26/2021 19:46   DG Knee 1-2 Views Left  Result Date: 03/25/2021 CLINICAL DATA:  Left knee bruising and swelling. EXAM: LEFT KNEE - 1-2 VIEW COMPARISON:  Femur radiograph earlier today. FINDINGS: No fracture or dislocation. Mild medial compartment joint space narrowing. Mild tricompartmental peripheral spurring. Limited assessment  for joint effusion on provided views. Soft tissue edema anteriorly. IMPRESSION: 1. Soft tissue edema without acute fracture or dislocation. 2. Mild tricompartmental osteoarthritis. Electronically Signed   By: Keith Rake M.D.   On: 03/25/2021 16:35   CT Head Wo Contrast  Result Date: 03/22/2021 CLINICAL DATA:  Near syncope. EXAM: CT HEAD WITHOUT CONTRAST TECHNIQUE: Contiguous axial images were obtained from the base of the skull through the vertex without intravenous contrast. COMPARISON:  September 2040 12/28/2018 and MRI June 04, 2019 FINDINGS: Brain: No evidence of acute large vascular territory infarction, hemorrhage, hydrocephalus, extra-axial collection or mass lesion/mass effect. Vascular: No hyperdense vessel or unexpected  calcification. Skull: Normal. Negative for fracture or focal lesion. Sinuses/Orbits: The visualized portions of the paranasal sinuses and mastoid air cells are predominantly clear. Orbits are grossly unremarkable. Other: Streak artifact from dental hardware. IMPRESSION: No acute intracranial findings. Electronically Signed   By: Dahlia Bailiff MD   On: 03/22/2021 20:05   CT KNEE LEFT WO CONTRAST  Result Date: 03/26/2021 CLINICAL DATA:  Anemic patient with a soft tissue mass of the left knee. History of a recent fall. EXAM: CT OF THE LEFT KNEE WITHOUT CONTRAST TECHNIQUE: Multidetector CT imaging of the left knee was performed according to the standard protocol. Multiplanar CT image reconstructions were also generated. COMPARISON:  Plain films left knee 03/25/2021. Ultrasound soft tissues about the left knee 03/25/2021. FINDINGS: Bones/Joint/Cartilage There is no acute or focal bony abnormality. No lytic or sclerotic lesion. Small osteophytes are present about the knee. Joint space narrowing is most notable medially. Ligaments Suboptimally assessed by CT. Muscles and Tendons Intact.  No intramuscular fluid collection or mass. Soft tissues No fluid collection or mass. There is some subcutaneous edema about the knee, most notable anteriorly. No radiopaque foreign body or soft tissue gas. IMPRESSION: Negative for mass or fluid collection. Infiltration of subcutaneous fat about the anterior aspect of the knee could be due to soft tissue contusion given the patient's history of fall. Dependent change or cellulitis are also possible. Negative for acute bony or joint abnormality. 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 Electronically Signed   By: Inge Rise M.D.   On: 03/26/2021 19:25   MR BRAIN WO CONTRAST  Result Date: 03/28/2021 CLINICAL DATA:  Dizziness, persistent/recurrent, cardiac or vascular cause suspected EXAM: MRI HEAD WITHOUT CONTRAST TECHNIQUE: Multiplanar, multiecho  pulse sequences of the brain and surrounding structures were obtained without intravenous contrast. COMPARISON:  06/04/2019 FINDINGS: Brain: No acute infarct, mass effect or extra-axial collection. No acute or chronic hemorrhage. There is multifocal hyperintense T2-weighted signal within the white matter. Generalized volume loss without a clear lobar predilection. The midline structures are normal. Vascular: Major flow voids are preserved. Skull and upper cervical spine: Normal calvarium and skull base. Visualized upper cervical spine and soft tissues are normal. Sinuses/Orbits:No paranasal sinus fluid levels or advanced mucosal thickening. No mastoid or middle ear effusion. Normal orbits. IMPRESSION: 1. No acute intracranial abnormality. 2. Generalized volume loss and findings of chronic microvascular ischemia. Electronically Signed   By: Ulyses Jarred M.D.   On: 03/28/2021 22:39   CT FEMUR LEFT WO CONTRAST  Result Date: 03/29/2021 CLINICAL DATA:  Left thigh bruising and low hemoglobin. Evaluate for hematoma. EXAM: CT OF THE LOWER LEFT EXTREMITY WITHOUT CONTRAST TECHNIQUE: Multidetector CT imaging of the left thigh was performed according to the standard protocol. COMPARISON:  CT left knee dated March 26, 2021. Left femur x-rays dated March 25, 2021. FINDINGS: Bones/Joint/Cartilage No  fracture or dislocation. Mild left hip and knee osteoarthritis. No joint effusion. Ligaments Ligaments are suboptimally evaluated by CT. Muscles and Tendons There is enlargement and loss of the normal internal architecture involving the proximal vastus lateralis muscle, with a few areas of ill-defined hypodensity. No measurable fluid collection or hematoma. Soft tissue No soft tissue mass. Unchanged prepatellar soft tissue swelling. IUD in the endometrial canal. IMPRESSION: 1. Enlargement and loss of the normal internal architecture involving the proximal vastus lateralis muscle, with a few areas of ill-defined hypodensity. Findings  are suspicious for intramuscular hematoma. 2. No acute osseous abnormality. Electronically Signed   By: Titus Dubin M.D.   On: 03/29/2021 08:11   [place] LT LOWER EXTREM LTD SOFT TISSUE NON VASCULAR  Result Date: 03/25/2021 CLINICAL DATA:  Recent fall.  Bruising around the left knee. EXAM: ULTRASOUND LEFT LOWER EXTREMITY LIMITED TECHNIQUE: Ultrasound examination of the lower extremity soft tissues was performed in the area of clinical concern. COMPARISON:  Left femur x-rays from same day. FINDINGS: Focused ultrasound of the area of concern along the left lateral thigh demonstrates soft tissue swelling without discrete fluid collection or hematoma. No soft tissue mass. IMPRESSION: 1. Soft tissue swelling without evidence of discrete fluid collection or hematoma. Electronically Signed   By: Titus Dubin M.D.   On: 03/25/2021 10:07   DG HIP UNILAT WITH PELVIS 2-3 VIEWS LEFT  Result Date: 03/25/2021 CLINICAL DATA:  Fall, left hip pain EXAM: DG HIP (WITH OR WITHOUT PELVIS) 2-3V LEFT COMPARISON:  None. FINDINGS: Normal alignment. No fracture or dislocation. Mild bilateral degenerative hip arthritis. Soft tissues are unremarkable. IMPRESSION: No acute fracture or dislocation. Electronically Signed   By: Fidela Salisbury MD   On: 03/25/2021 03:37   DG FEMUR MIN 2 VIEWS LEFT  Result Date: 03/25/2021 CLINICAL DATA:  Fall, left leg pain EXAM: LEFT FEMUR 2 VIEWS COMPARISON:  None. FINDINGS: Normal alignment. No fracture or dislocation. Mild left hip degenerative arthritis. Soft tissues are unremarkable. IMPRESSION: No acute fracture or dislocation. Electronically Signed   By: Fidela Salisbury MD   On: 03/25/2021 03:37      Subjective: No new complaints.   Discharge Exam: Vitals:   04/02/21 0932 04/02/21 1046  BP: 131/68 111/74  Pulse: 69   Resp: 20 19  Temp:  97.9 F (36.6 C)  SpO2: 94%    Vitals:   04/02/21 0500 04/02/21 0732 04/02/21 0932 04/02/21 1046  BP:  134/76 131/68 111/74  Pulse:   69    Resp:  '16 20 19  '$ Temp:  98.3 F (36.8 C)  97.9 F (36.6 C)  TempSrc:  Oral    SpO2:   94%   Weight: 112.5 kg     Height:        General: Pt is alert, awake, not in acute distress Cardiovascular: RRR, S1/S2 +, no rubs, no gallops Respiratory: CTA bilaterally, no wheezing, no rhonchi Abdominal: Soft, NT, ND, bowel sounds + Extremities: no edema, no cyanosis    The results of significant diagnostics from this hospitalization (including imaging, microbiology, ancillary and laboratory) are listed below for reference.     Microbiology: Recent Results (from the past 240 hour(s))  Culture, blood (routine x 2)     Status: None (Preliminary result)   Collection Time: 04/01/21  6:38 PM   Specimen: BLOOD RIGHT FOREARM  Result Value Ref Range Status   Specimen Description BLOOD RIGHT FOREARM  Final   Special Requests IN PEDIATRIC BOTTLE Blood Culture adequate volume  Final  Culture   Final    NO GROWTH 4 DAYS Performed at [facility], [place] [address], [place], [place] [address]    Report Status PENDING  Incomplete  Culture, blood (routine x 2)     Status: None (Preliminary result)   Collection Time: 04/01/21  6:46 PM   Specimen: BLOOD RIGHT HAND  Result Value Ref Range Status   Specimen Description BLOOD RIGHT HAND  Final   Special Requests   Final    BOTTLES DRAWN AEROBIC ONLY Blood Culture results may not be optimal due to an inadequate volume of blood received in culture bottles   Culture   Final    NO GROWTH 4 DAYS Performed at [facility], [place] [address], [place], [place] [address]    Report Status PENDING  Incomplete     Labs: BNP (last 3 results) No results for input(s): BNP in the last 8760 hours. Basic Metabolic Panel: Recent Labs  Lab 03/31/21 0042  NA 135  K 4.0  CL 105  CO2 23  GLUCOSE 88  BUN 10  CREATININE 0.75  CALCIUM 9.4  MG 2.1   Liver Function Tests: Recent Labs  Lab 03/31/21 0042  AST 17  ALT 15  ALKPHOS 48  BILITOT 1.8*   PROT 6.1*  ALBUMIN 2.7*   No results for input(s): LIPASE, AMYLASE in the last 168 hours. No results for input(s): AMMONIA in the last 168 hours. CBC: Recent Labs  Lab 03/31/21 0042 04/01/21 0042 04/02/21 0008  WBC 12.3* 14.0* 13.4*  NEUTROABS 8.5*  --   --   HGB 8.7* 10.1* 9.1*  HCT 27.9* 32.5* 29.9*  MCV 89.7 90.3 91.7  PLT 321 411* 362   Cardiac Enzymes: No results for input(s): CKTOTAL, CKMB, CKMBINDEX, TROPONINI in the last 168 hours. BNP: Invalid input(s): POCBNP CBG: Recent Labs  Lab 03/31/21 0614 04/01/21 0615 04/02/21 0628  GLUCAP 87 88 103*   D-Dimer No results for input(s): DDIMER in the last 72 hours. Hgb A1c No results for input(s): HGBA1C in the last 72 hours. Lipid Profile No results for input(s): CHOL, HDL, LDLCALC, TRIG, CHOLHDL, LDLDIRECT in the last 72 hours. Thyroid function studies No results for input(s): TSH, T4TOTAL, T3FREE, THYROIDAB in the last 72 hours.  Invalid input(s): FREET3 Anemia work up No results for input(s): VITAMINB12, FOLATE, FERRITIN, TIBC, IRON, RETICCTPCT in the last 72 hours. Urinalysis    Component Value Date/Time   COLORURINE YELLOW 04/01/2021 1656   APPEARANCEUR CLEAR 04/01/2021 1656   LABSPEC 1.015 04/01/2021 1656   PHURINE 8.0 04/01/2021 1656   GLUCOSEU NEGATIVE 04/01/2021 1656   HGBUR NEGATIVE 04/01/2021 1656   BILIRUBINUR NEGATIVE 04/01/2021 1656   KETONESUR NEGATIVE 04/01/2021 1656   PROTEINUR NEGATIVE 04/01/2021 1656   NITRITE NEGATIVE 04/01/2021 1656   LEUKOCYTESUR NEGATIVE 04/01/2021 1656   Sepsis Labs Invalid input(s): PROCALCITONIN,  WBC,  LACTICIDVEN Microbiology Recent Results (from the past 240 hour(s))  Culture, blood (routine x 2)     Status: None (Preliminary result)   Collection Time: 04/01/21  6:38 PM   Specimen: BLOOD RIGHT FOREARM  Result Value Ref Range Status   Specimen Description BLOOD RIGHT FOREARM  Final   Special Requests IN PEDIATRIC BOTTLE Blood Culture adequate volume  Final    Culture   Final    NO GROWTH 4 DAYS Performed at [facility], [place] [address], [place], [place] [address]    Report Status PENDING  Incomplete  Culture, blood (routine x 2)  Status: None (Preliminary result)   Collection Time: 04/01/21  6:46 PM   Specimen: BLOOD RIGHT HAND  Result Value Ref Range Status   Specimen Description BLOOD RIGHT HAND  Final   Special Requests   Final    BOTTLES DRAWN AEROBIC ONLY Blood Culture results may not be optimal due to an inadequate volume of blood received in culture bottles   Culture   Final    NO GROWTH 4 DAYS Performed at [facility], [place] [address], [place], [place] [address]    Report Status PENDING  Incomplete     Time coordinating discharge: 36 minutes.   SIGNED:   Hosie Poisson, MD  [facility]

## 2021-04-07 LAB — CULTURE, BLOOD (ROUTINE X 2)
Culture: NO GROWTH
Culture: NO GROWTH
Special Requests: ADEQUATE

## 2021-04-12 ENCOUNTER — Telehealth (HOSPITAL_COMMUNITY): Payer: Self-pay | Admitting: *Deleted

## 2021-04-12 ENCOUNTER — Telehealth: Payer: Self-pay | Admitting: Cardiology

## 2021-04-12 NOTE — Telephone Encounter (Signed)
Tiffany with [facility] is requesting Dr. Joya Gaskins approval to take patient's INR. Please advise when able.  Phone #: [phone]

## 2021-04-12 NOTE — Telephone Encounter (Signed)
Received call from RN requesting we adjust pts meds due to orthostatic hypotension. Pt has only been seen once in our clinic over a year ago. Left RN vm informing and advised her to contact pts primary cardiologist.   [facility] RN [phone]

## 2021-04-12 NOTE — Telephone Encounter (Signed)
Spoke to [name] just now and gave her this verbal order   She would also like to change the patient Torsemide from 60 mg daily to 40 mg daily due to the patient feeling better and not requiring this dosage. She would like a verbal order for that from Dr. Bettina Gavia as well.

## 2021-04-12 NOTE — Telephone Encounter (Signed)
Spoke to [place] just now and let her know Dr. Joya Gaskins recommendations. She is going to have the patient call us to schedule an appointment with Dr. Bettina Gavia.

## 2021-04-16 ENCOUNTER — Encounter: Payer: Self-pay | Admitting: Primary Care

## 2021-04-16 ENCOUNTER — Ambulatory Visit (INDEPENDENT_AMBULATORY_CARE_PROVIDER_SITE_OTHER): Payer: Medicare Other

## 2021-04-16 ENCOUNTER — Ambulatory Visit (INDEPENDENT_AMBULATORY_CARE_PROVIDER_SITE_OTHER): Payer: Medicare Other | Admitting: Primary Care

## 2021-04-16 ENCOUNTER — Other Ambulatory Visit: Payer: Self-pay

## 2021-04-16 ENCOUNTER — Telehealth: Payer: Self-pay | Admitting: Primary Care

## 2021-04-16 VITALS — BP 110/70 | HR 84 | Temp 98.1°F | Ht 61.0 in | Wt 226.0 lb

## 2021-04-16 DIAGNOSIS — R0602 Shortness of breath: Secondary | ICD-10-CM

## 2021-04-16 DIAGNOSIS — G4733 Obstructive sleep apnea (adult) (pediatric): Secondary | ICD-10-CM | POA: Diagnosis not present

## 2021-04-16 IMAGING — DX DG CHEST 2V
2 series · 2 of 2 positions shown · non-contrast
Comparison: [DATE].

CLINICAL DATA: Shortness of breath.

EXAM:
CHEST - 2 VIEW

[chest ap]
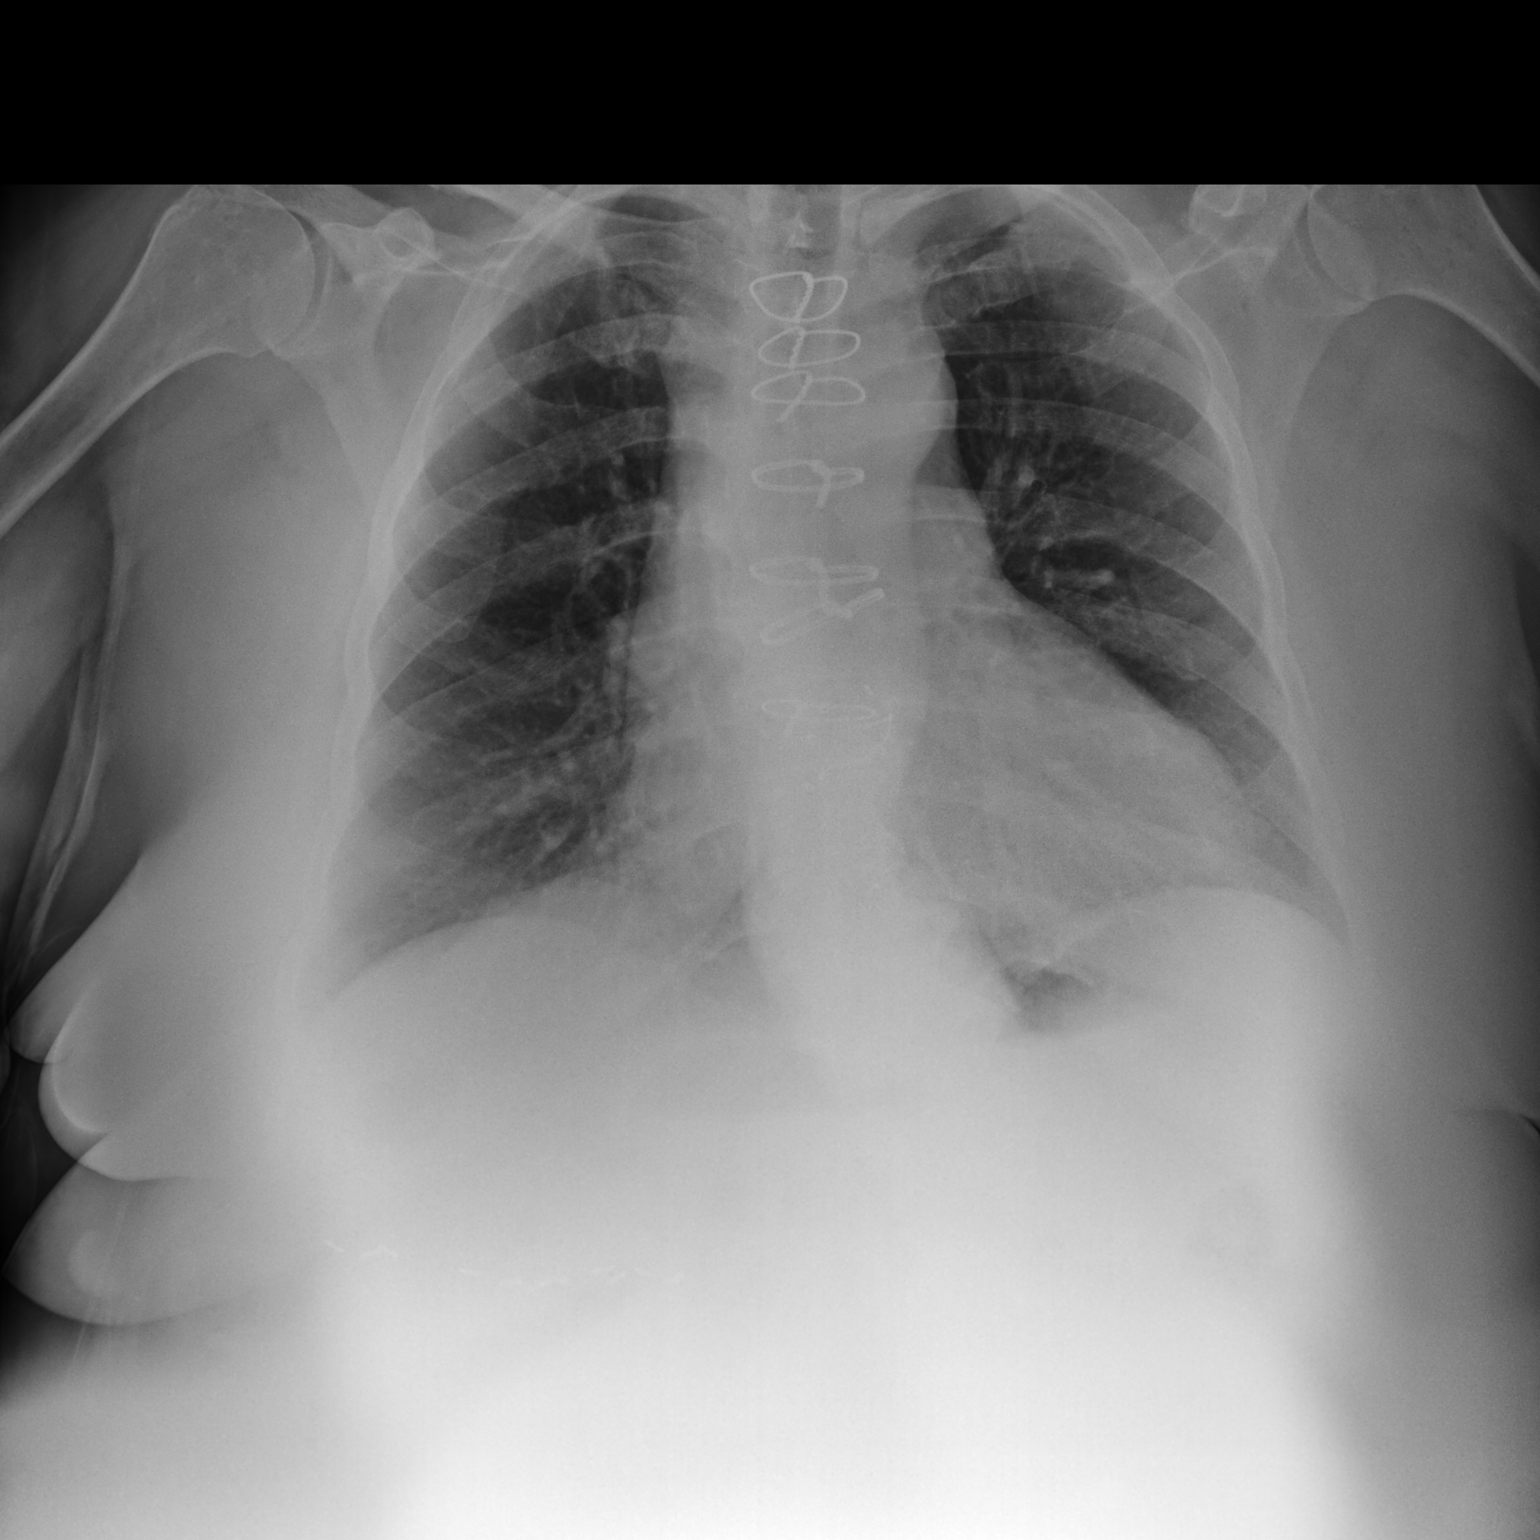

[chest lat]
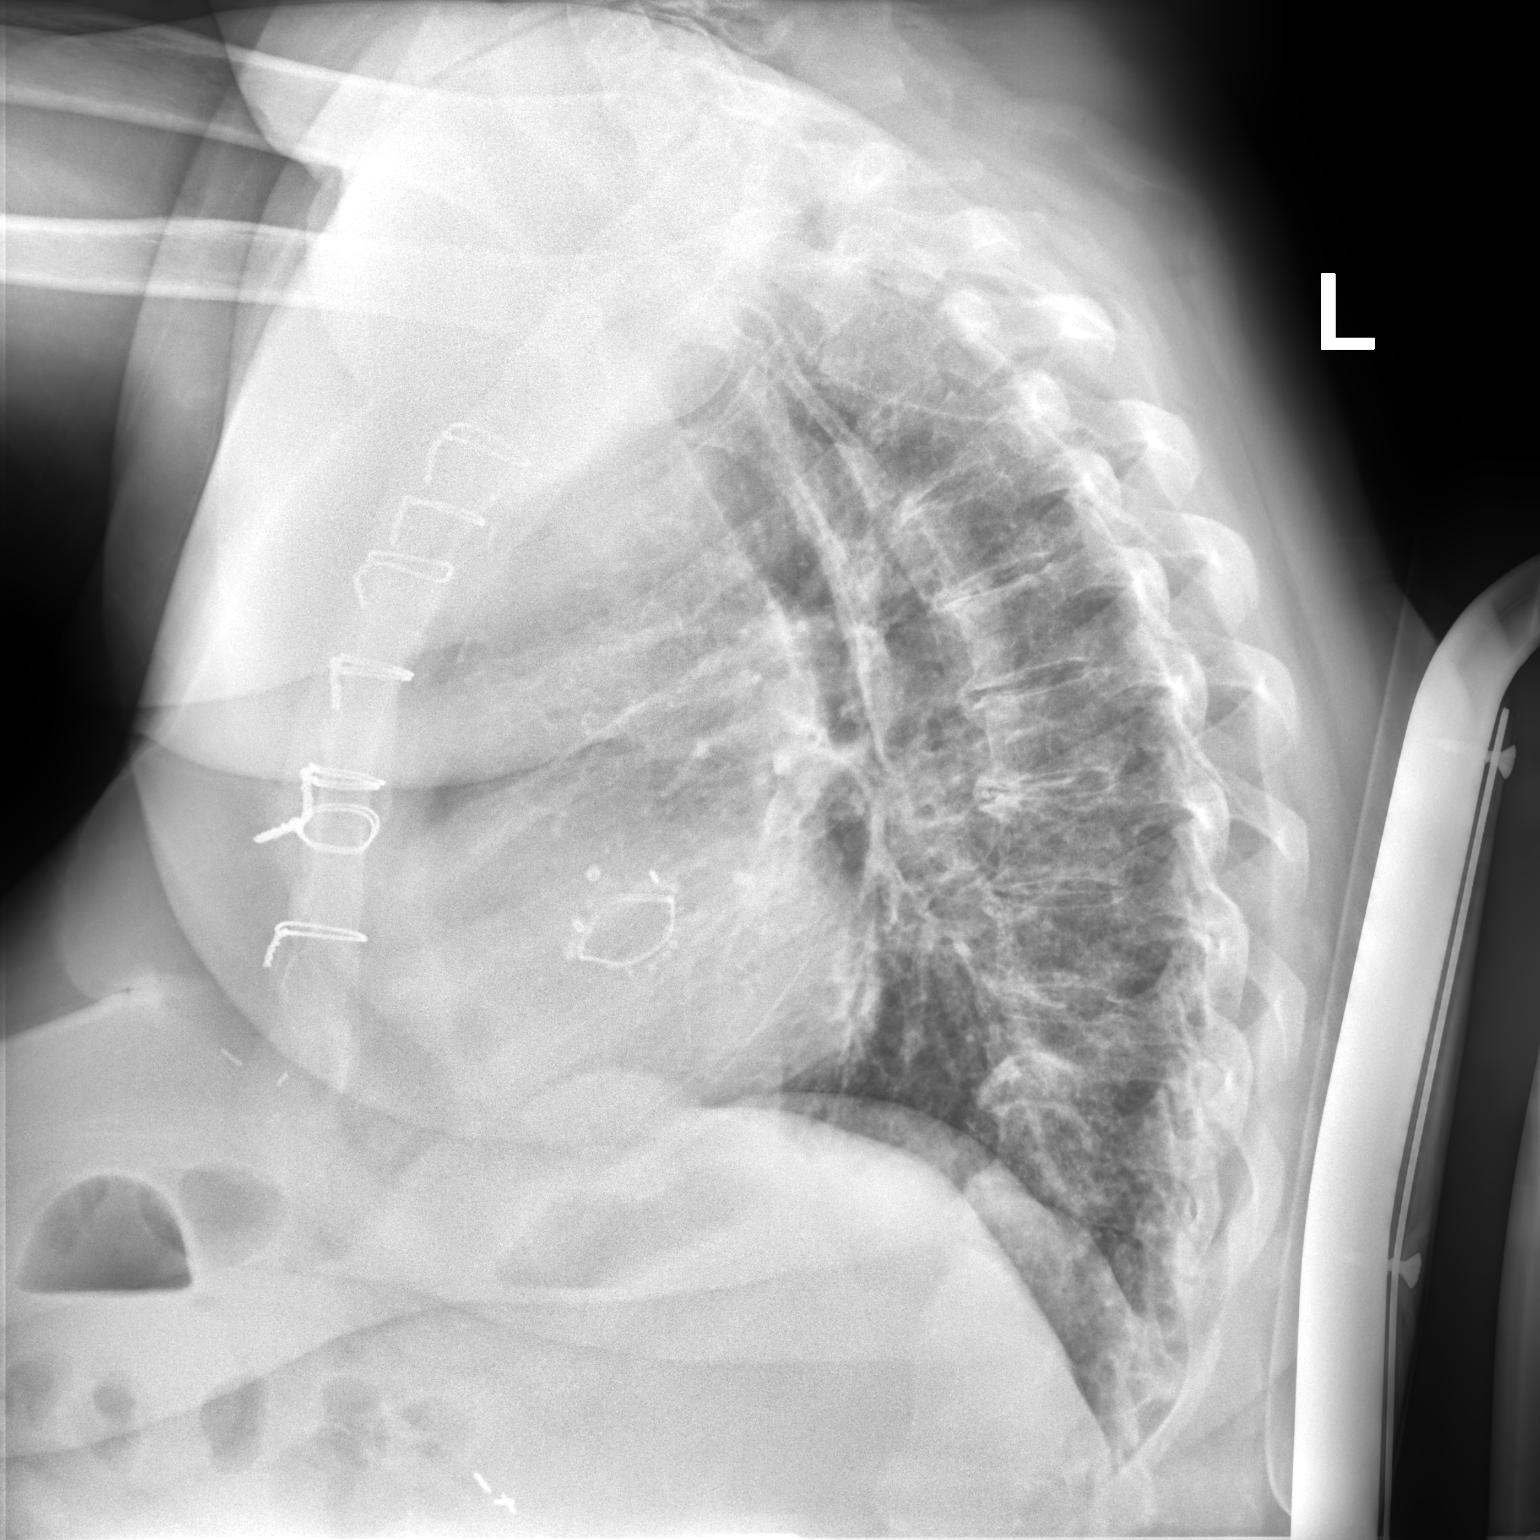

[2 of 2 positions shown; findings below may reference images not displayed]

FINDINGS: Stable cardiomediastinal silhouette. Status post aortic valve
repair. Both lungs are clear. The visualized skeletal structures are
unremarkable.
IMPRESSION: No active cardiopulmonary disease.

## 2021-04-16 NOTE — Progress Notes (Signed)
$'@Patient'C$  ID: [name], female    DOB: 15-Nov-1955, 65 y.o.   MRN: [id number]  Chief Complaint  Patient presents with   Follow-up    Referring provider: [name], MD  HPI: 65 year old female never smoker followed in our office for severe obstructive sleep apnea and insomnia  PMH: Morbid obesity, hypertension, CAD, chronic diastolic congestive heart failure, lymphedema Smoker/ Smoking History: Never Smoker  Maintenance:  None  Pt of: Dr. Ander Slade  Previous [facility] pulmonary encounter: 10/07/2019  - Visit  65 year old female never smoker initially referred to our office on 06/21/2019 for obstructive sleep apnea. January/2020-sleep study-severe obstructive sleep apnea requiring BiPAP patient was last seen in our office in December/2020.  At that time she was encouraged to resume compliance with BiPAP.  She was currently noncompliant at that time.  She is encouraged to increase her physical activity due to her severe deconditioning as well as obesity.  She was encouraged to continue on her Rozerem 8 mg nightly for insomnia.  Patient presenting today as a follow-up from December/2020.  No available compliance reports available online.  [facility] directly.  DME company reporting the patient is not currently using BiPAP.  Patient reports that there was confusion when she was initially prescribed CPAP therapy by primary care in 2020.  This was prescribed incorrectly from primary care and then Dr. Evette Georges office with cardiology wanted her to be on BiPAP.  She was later referred to our office.  She reports that she is currently using her BiPAP.  Unfortunately we do not have a compliance report on this.  She reports that she does not use it every every day but over the last week she is used it four times.  They believe that they have an SD card in the machine.  They will also follow-up with her DME company [employer].  Patient aware she will need to bring SD card to [place]  or to our office to obtain download   04/16/2021 - interim hx  Patient presents today for overdue follow-up. PMH significant for heat failure, HTN and severe OSA. She  is non-compliant with BIPAP, states that she stopped wearing it this past December. No data on SD card and her DME has not been able to retreive a download. She would like to change to [facility]. She was using [employer] but this was recalled and she has not received a new one. Her husband states that they registered the machine online. She was using full face mask and also reports having some trouble with the velcro on her headgear. Bedtime is around 11pm; she gets out of bed 7-8am. She wakes ups at least three times a night. She is open to resuming BIPAP.  CPAP titration on 09/21/2018 showed that she needs bilevel pressure 27/22.   Allergies  Allergen Reactions   Vancomycin Itching    Patient can receive vancomycin with slower infusion and prn benadryl for itching     Immunization History  Administered Date(s) Administered   Influenza Inj Mdck Quad Pf 09/14/2018   Influenza,inj,Quad PF,6+ Mos 06/05/2019   Moderna Sars-Covid-2 Vaccination 10/14/2019, 11/11/2019, 09/09/2020   Pneumococcal Polysaccharide-23 06/05/2019    Past Medical History:  Diagnosis Date   Anemia 06/03/2019   Aortic stenosis 03/23/2013   Overview:  02/18/13 TTE EF >55%. Critical AS with mean Ao valve gradient of 82 mm Hg. No AI. No MR, PR, mild TR. Estimated RVSP 30 mm Hg.  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 Postmenopausal bleeding since for the past 2 years.  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 Last biopsy was in May of 2019 which showed weakly proliferative endometrium possibly a polyp.     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    Tobacco History: Social History   Tobacco Use  Smoking Status Never  Smokeless Tobacco Never   Counseling given: Not Answered   Outpatient Medications Prior to Visit  Medication Sig Dispense Refill   acetaminophen (TYLENOL) 325 MG tablet Take 2 tablets (650 mg total) by mouth every 6 (six) hours as needed for mild pain or headache. 30 tablet    buPROPion (WELLBUTRIN XL) 150 MG 24 hr tablet Take 150 mg by mouth daily.     camphor-menthol (SARNA) lotion Apply 1 application topically as needed for itching. 222 mL 0   DULoxetine (CYMBALTA) 60 MG capsule Take 60 mg by mouth daily.     Ferrous Sulfate Dried (HIGH POTENCY IRON) 65 MG TABS Take 1 tablet by mouth daily.     folic acid (FOLVITE) 1 MG tablet Take 1 tablet (1 mg total) by mouth daily.     levonorgestrel (MIRENA) 20 MCG/DAY IUD 1 each by Intrauterine route once.     metoprolol succinate (TOPROL-XL) 25 MG 24 hr tablet Take 25-50 mg by mouth See admin instructions. Take two of the 25 mg (total of 50 mg) tablets by mouth in the morning and then  take one 25 mg tablet by mouth in the evening per patient     oxybutynin (DITROPAN-XL) 5 MG 24 hr tablet Take 5 mg by mouth every evening.     potassium chloride (KLOR-CON) 10 MEQ tablet Take 10 mEq by mouth 2 (two) times daily.     pravastatin (PRAVACHOL) 40 MG tablet Take 1 tablet (40 mg total) by mouth daily at 6 PM. 30 tablet 0   senna-docusate (SENOKOT-S) 8.6-50 MG tablet Take 1 tablet by mouth 2 (two) times daily.     torsemide (DEMADEX) 20 MG tablet Take 20 mg by mouth daily. Take 2 tablets once daily.     vitamin B-12 1000 MCG tablet Take 1 tablet (1,000 mcg total) by mouth daily.     warfarin (COUMADIN) 2.5 MG tablet Take 1 tablet by mouth once daily 60 tablet 0   torsemide (DEMADEX) 20 MG tablet Take 3 tablets by mouth once daily (Patient taking differently: Take 20 mg by mouth 2 (  two) times daily.) 90 tablet 3   No facility-administered medications prior to visit.    Review of Systems  Review of Systems  Constitutional:  Positive for fatigue.  Respiratory:  Positive for apnea.   Cardiovascular:  Positive for leg swelling.  Psychiatric/Behavioral:  Positive for sleep disturbance.     Physical Exam  BP 110/70 (BP Location: Left Arm, Patient Position: Sitting, Cuff Size: Normal)   Pulse 84   Temp 98.1 F (36.7 C) (Oral)   Ht '5\' 1"'$  (1.549 m)   Wt 226 lb (102.5 kg)   SpO2 95%   BMI 42.70 kg/m  Physical Exam Constitutional:      Appearance: Normal appearance. She is obese.  HENT:     Head: Normocephalic and atraumatic.     Mouth/Throat:     Mouth: Mucous membranes are moist.     Pharynx: Oropharynx is clear.  Cardiovascular:     Rate and Rhythm: Normal rate and regular rhythm.  Pulmonary:     Effort: Pulmonary effort is normal.     Breath sounds: Normal breath sounds. No wheezing or rales.  Musculoskeletal:     Comments: IN WC  Skin:    General: Skin is warm and dry.  Neurological:     General: No focal deficit present.     Mental Status: She is alert and  oriented to person, place, and time. Mental status is at baseline.  Psychiatric:        Mood and Affect: Mood normal.        Behavior: Behavior normal.        Thought Content: Thought content normal.     Lab Results:  CBC    Component Value Date/Time   WBC 13.4 (H) 04/02/2021 0008   RBC 3.26 (L) 04/02/2021 0008   HGB 9.1 (L) 04/02/2021 0008   HGB 9.5 (L) 02/12/2019 1116   HCT 29.9 (L) 04/02/2021 0008   HCT 32.0 (L) 06/06/2019 0518   PLT 362 04/02/2021 0008   PLT 393 02/12/2019 1116   MCV 91.7 04/02/2021 0008   MCV 76 (L) 02/12/2019 1116   MCH 27.9 04/02/2021 0008   MCHC 30.4 04/02/2021 0008   RDW 15.9 (H) 04/02/2021 0008   RDW 18.2 (H) 02/12/2019 1116   LYMPHSABS 2.0 03/31/2021 0042   MONOABS 0.9 03/31/2021 0042   EOSABS 0.8 (H) 03/31/2021 0042   BASOSABS 0.1 03/31/2021 0042    BMET    Component Value Date/Time   NA 135 03/31/2021 0042   NA 140 06/06/2020 0903   K 4.0 03/31/2021 0042   CL 105 03/31/2021 0042   CO2 23 03/31/2021 0042   GLUCOSE 88 03/31/2021 0042   BUN 10 03/31/2021 0042   BUN 13 06/06/2020 0903   CREATININE 0.75 03/31/2021 0042   CALCIUM 9.4 03/31/2021 0042   GFRNONAA >60 03/31/2021 0042   GFRAA 74 06/06/2020 0903    BNP    Component Value Date/Time   BNP 149.4 (H) 09/15/2019 1107    ProBNP    Component Value Date/Time   PROBNP 1,079 (H) 06/06/2020 0903    Imaging: CT ABDOMEN PELVIS WO CONTRAST  Result Date: 03/26/2021 CLINICAL DATA:  Anemia.  Soft tissue mass knee. EXAM: CT ABDOMEN AND PELVIS WITHOUT CONTRAST TECHNIQUE: Multidetector CT imaging of the abdomen and pelvis was performed following the standard protocol without IV contrast. COMPARISON:  None. FINDINGS: Lower chest: Trace left pleural effusion. Hepatobiliary: No focal liver abnormality. Status post cholecystectomy. No biliary dilatation. Pancreas: No focal lesion. Normal  pancreatic contour. No surrounding inflammatory changes. No main pancreatic ductal dilatation. Spleen:  Normal in size without focal abnormality. Adrenals/Urinary Tract: No adrenal nodule bilaterally. No nephrolithiasis, no hydronephrosis. Bilateral fluid density lesions measuring up to 12.5 cm on the right and 9 cm on the left likely representing simple renal cysts. No ureterolithiasis or hydroureter. The urinary bladder is unremarkable. Stomach/Bowel: High density material within the gastric lumen likely ingested medication/food. Stomach is within normal limits. No evidence of bowel wall thickening or dilatation. Scattered colonic diverticulosis. Appendix appears normal. Vascular/Lymphatic: No abdominal aorta or iliac aneurysm.Mild atherosclerotic plaque. Asymmetrically prominent but nonenlarged left inguinal lymph nodes. Enlarged left pelvic sidewall 1.4 cm lymph node (1:69). Borderline enlarged left retroperitoneal lymph nodes (49, 1:54). Reproductive: T shaped intrauterine device noted within the uterus in grossly appropriate position. Otherwise uterus and bilateral adnexa are unremarkable. Other: Trace left retroperitoneal free fluid and fat stranding (1:42-55). No intraperitoneal free fluid. No intraperitoneal free gas. No organized fluid collection. Musculoskeletal: Asymmetrically enlarged left proximal thigh musculature compared to the right. Left proximal thigh superficial and deep subcutaneus soft tissue edema. No suspicious lytic or blastic osseous lesions. No acute displaced fracture. Levoscoliosis of the lumbar spine with associated multilevel degenerative changes of the spine. IMPRESSION: 1. Suggestion of a trace volume left retroperitoneal hemorrhage. 2. Left proximal thigh superficial and deep subcutaneus soft tissue edema with asymmetrically enlarged left proximal thigh musculature compared to the right. Underlying intramuscular hematoma not excluded. No definite organized fluid collection with limited evaluation on this noncontrast study. 3. Left pelvic sidewall lymphadenopathy. 4. Trace left  pleural effusion. 5. Large bilateral simple renal cysts measuring up to 12.5 cm on the right. 6.  Aortic Atherosclerosis (ICD10-I70.0). 7. Please note limited evaluation on this noncontrast study. These results will be called to the ordering clinician or representative by the Radiologist Assistant, and communication documented in the PACS or [employer]. Electronically Signed   By: Iven Finn M.D.   On: 03/26/2021 19:46   DG Knee 1-2 Views Left  Result Date: 03/25/2021 CLINICAL DATA:  Left knee bruising and swelling. EXAM: LEFT KNEE - 1-2 VIEW COMPARISON:  Femur radiograph earlier today. FINDINGS: No fracture or dislocation. Mild medial compartment joint space narrowing. Mild tricompartmental peripheral spurring. Limited assessment for joint effusion on provided views. Soft tissue edema anteriorly. IMPRESSION: 1. Soft tissue edema without acute fracture or dislocation. 2. Mild tricompartmental osteoarthritis. Electronically Signed   By: Keith Rake M.D.   On: 03/25/2021 16:35   CT Head Wo Contrast  Result Date: 03/22/2021 CLINICAL DATA:  Near syncope. EXAM: CT HEAD WITHOUT CONTRAST TECHNIQUE: Contiguous axial images were obtained from the base of the skull through the vertex without intravenous contrast. COMPARISON:  September 2040 12/28/2018 and MRI June 04, 2019 FINDINGS: Brain: No evidence of acute large vascular territory infarction, hemorrhage, hydrocephalus, extra-axial collection or mass lesion/mass effect. Vascular: No hyperdense vessel or unexpected calcification. Skull: Normal. Negative for fracture or focal lesion. Sinuses/Orbits: The visualized portions of the paranasal sinuses and mastoid air cells are predominantly clear. Orbits are grossly unremarkable. Other: Streak artifact from dental hardware. IMPRESSION: No acute intracranial findings. Electronically Signed   By: Dahlia Bailiff MD   On: 03/22/2021 20:05   CT KNEE LEFT WO CONTRAST  Result Date: 03/26/2021 CLINICAL  DATA:  Anemic patient with a soft tissue mass of the left knee. History of a recent fall. EXAM: CT OF THE LEFT KNEE WITHOUT CONTRAST TECHNIQUE: Multidetector CT imaging of the left knee was performed according to  the standard protocol. Multiplanar CT image reconstructions were also generated. COMPARISON:  Plain films left knee 03/25/2021. Ultrasound soft tissues about the left knee 03/25/2021. FINDINGS: Bones/Joint/Cartilage There is no acute or focal bony abnormality. No lytic or sclerotic lesion. Small osteophytes are present about the knee. Joint space narrowing is most notable medially. Ligaments Suboptimally assessed by CT. Muscles and Tendons Intact.  No intramuscular fluid collection or mass. Soft tissues No fluid collection or mass. There is some subcutaneous edema about the knee, most notable anteriorly. No radiopaque foreign body or soft tissue gas. IMPRESSION: Negative for mass or fluid collection. Infiltration of subcutaneous fat about the anterior aspect of the knee could be due to soft tissue contusion given the patient's history of fall. Dependent change or cellulitis are also possible. Negative for acute bony or joint abnormality. 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 Electronically Signed   By: Inge Rise M.D.   On: 03/26/2021 19:25   MR BRAIN WO CONTRAST  Result Date: 03/28/2021 CLINICAL DATA:  Dizziness, persistent/recurrent, cardiac or vascular cause suspected EXAM: MRI HEAD WITHOUT CONTRAST TECHNIQUE: Multiplanar, multiecho pulse sequences of the brain and surrounding structures were obtained without intravenous contrast. COMPARISON:  06/04/2019 FINDINGS: Brain: No acute infarct, mass effect or extra-axial collection. No acute or chronic hemorrhage. There is multifocal hyperintense T2-weighted signal within the white matter. Generalized volume loss without a clear lobar predilection. The midline structures are normal. Vascular: Major flow voids are  preserved. Skull and upper cervical spine: Normal calvarium and skull base. Visualized upper cervical spine and soft tissues are normal. Sinuses/Orbits:No paranasal sinus fluid levels or advanced mucosal thickening. No mastoid or middle ear effusion. Normal orbits. IMPRESSION: 1. No acute intracranial abnormality. 2. Generalized volume loss and findings of chronic microvascular ischemia. Electronically Signed   By: Ulyses Jarred M.D.   On: 03/28/2021 22:39   CT FEMUR LEFT WO CONTRAST  Result Date: 03/29/2021 CLINICAL DATA:  Left thigh bruising and low hemoglobin. Evaluate for hematoma. EXAM: CT OF THE LOWER LEFT EXTREMITY WITHOUT CONTRAST TECHNIQUE: Multidetector CT imaging of the left thigh was performed according to the standard protocol. COMPARISON:  CT left knee dated March 26, 2021. Left femur x-rays dated March 25, 2021. FINDINGS: Bones/Joint/Cartilage No fracture or dislocation. Mild left hip and knee osteoarthritis. No joint effusion. Ligaments Ligaments are suboptimally evaluated by CT. Muscles and Tendons There is enlargement and loss of the normal internal architecture involving the proximal vastus lateralis muscle, with a few areas of ill-defined hypodensity. No measurable fluid collection or hematoma. Soft tissue No soft tissue mass. Unchanged prepatellar soft tissue swelling. IUD in the endometrial canal. IMPRESSION: 1. Enlargement and loss of the normal internal architecture involving the proximal vastus lateralis muscle, with a few areas of ill-defined hypodensity. Findings are suspicious for intramuscular hematoma. 2. No acute osseous abnormality. Electronically Signed   By: Titus Dubin M.D.   On: 03/29/2021 08:11   [place] LT LOWER EXTREM LTD SOFT TISSUE NON VASCULAR  Result Date: 03/25/2021 CLINICAL DATA:  Recent fall.  Bruising around the left knee. EXAM: ULTRASOUND LEFT LOWER EXTREMITY LIMITED TECHNIQUE: Ultrasound examination of the lower extremity soft tissues was performed in the area of  clinical concern. COMPARISON:  Left femur x-rays from same day. FINDINGS: Focused ultrasound of the area of concern along the left lateral thigh demonstrates soft tissue swelling without discrete fluid collection or hematoma. No soft tissue mass. IMPRESSION: 1. Soft tissue swelling without evidence of discrete fluid collection or hematoma. Electronically  Signed   By: Titus Dubin M.D.   On: 03/25/2021 10:07   DG HIP UNILAT WITH PELVIS 2-3 VIEWS LEFT  Result Date: 03/25/2021 CLINICAL DATA:  Fall, left hip pain EXAM: DG HIP (WITH OR WITHOUT PELVIS) 2-3V LEFT COMPARISON:  None. FINDINGS: Normal alignment. No fracture or dislocation. Mild bilateral degenerative hip arthritis. Soft tissues are unremarkable. IMPRESSION: No acute fracture or dislocation. Electronically Signed   By: Fidela Salisbury MD   On: 03/25/2021 03:37   DG FEMUR MIN 2 VIEWS LEFT  Result Date: 03/25/2021 CLINICAL DATA:  Fall, left leg pain EXAM: LEFT FEMUR 2 VIEWS COMPARISON:  None. FINDINGS: Normal alignment. No fracture or dislocation. Mild left hip degenerative arthritis. Soft tissues are unremarkable. IMPRESSION: No acute fracture or dislocation. Electronically Signed   By: Fidela Salisbury MD   On: 03/25/2021 03:37     Assessment & Plan:   Sleep apnea - Split night sleep study 09/15/2018 >> severe OSA, AHI 120/hr. She underwent a CPAP titration 09/21/2018 requiring bilevel pressure 27/22 - She is has been non-compliant with BIPAP. She will need a repeat split-night sleep study since there has been a break in therapy. We will also place an order for new BIPAP machine d/t [employer] Respironics recall - We reviewed importance of compliant with BIPAP. Discussed how untreated sleep apnea puts you at high risk for cardiovascular disease, cardiac arrhthymias, PE, pulmonary HTN and diatbetes   Orders: - Split night sleep study re: severe OSA - DME referral new BIPAP machine 27/22 (currently has recalled philip's  Respironics)  Follow-up: - 3 months with Dr. Annamaria Boots (new patient- OSA)      Martyn Ehrich, NP 04/16/2021

## 2021-04-16 NOTE — Telephone Encounter (Signed)
Patient would like to change to a different provider, Dr. Annamaria Boots are you ok with seeing her for sleep?

## 2021-04-16 NOTE — Telephone Encounter (Signed)
She has an apt with you in November. Thanks

## 2021-04-16 NOTE — Assessment & Plan Note (Addendum)
-   Split night sleep study 09/15/2018 >> severe OSA, AHI 120/hr. She underwent a CPAP titration 09/21/2018 requiring bilevel pressure 27/22 - She is has been non-compliant with BIPAP. She will need a repeat split-night sleep study since there has been a break in therapy. We will also place an order for new BIPAP machine d/t [employer] Respironics recall - We reviewed importance of compliant with BIPAP. Discussed how untreated sleep apnea puts you at high risk for cardiovascular disease, cardiac arrhthymias, PE, pulmonary HTN and diatbetes   Orders: - Split night sleep study re: severe OSA - DME referral new BIPAP machine 27/22 (currently has recalled [employer] Respironics)  Follow-up: - 3 months with Dr. Annamaria Boots (new patient- OSA)

## 2021-04-16 NOTE — Patient Instructions (Addendum)
We are going to set you up with another sleep study since your last one was > 2 years ago and there has been a break in therapy. We will also place an order for new BIPAP machine since philips one has been recalled. Make sure you have registered your machine online. Once you get new machine please aim to wear BIPAP every night for minimum 4-6 hours or longer. Untreated sleep apnea puts you at high risk for cardiovascular disease, cardiac arrhthymias, PE, pulmonary HTN and diatbetes   Orders: - Split night sleep study re: severe OSA - DME referral new BIPAP machine 27/22 (currently has recalled philip's Respironics)  Follow-up: - 3 months with Dr. Annamaria Boots (new patient- OSA)   CPAP and BPAP Information CPAP and BPAP (also called BiPAP) are methods that use air pressure to keep your airways open and to help you breathe well. CPAP and BPAP use different amounts of pressure. Your health care provider will tell you whether CPAP or BPAP would be more helpful for you. CPAP stands for "continuous positive airway pressure." With CPAP, the amount of pressure stays the same while you breathe in (inhale) and out (exhale). BPAP stands for "bi-level positive airway pressure." With BPAP, the amount of pressure will be higher when you inhale and lower when you exhale. This allows you to take larger breaths. CPAP or BPAP may be used in the hospital, or your health care provider may want you to use it at home. You may need to have a sleep study before your healthcare provider can order a machine for you to use at home. What are the advantages? CPAP or BPAP can be helpful if you have: Sleep apnea. Chronic obstructive pulmonary disease (COPD). Heart failure. Medical conditions that cause muscle weakness, including muscular dystrophy or amyotrophic lateral sclerosis (ALS). Other problems that cause breathing to be shallow, weak, abnormal, or difficult. CPAP and BPAP are most commonly used for obstructive sleep apnea  (OSA) to keepthe airways from collapsing when the muscles relax during sleep. What are the risks? Generally, this is a safe treatment. However, problems may occur, including: Irritated skin or skin sores if the mask does not fit properly. Dry or stuffy nose or nosebleeds. Dry mouth. Feeling gassy or bloated. Sinus or lung infection if the equipment is not cleaned properly. When should CPAP or BPAP be used? In most cases, the mask only needs to be worn during sleep. Generally, the mask needs to be worn throughout the night and during any daytime naps. People with certain medical conditions may also need to wear the mask at other times, such as when they are awake. Follow instructions from your health care providerabout when to use the machine. What happens during CPAP or BPAP?  Both CPAP and BPAP are provided by a small machine with a flexible plastic tube that attaches to a plastic mask that you wear. Air is blown through the mask into your nose or mouth. The amount of pressure that is used to blow the air can be adjusted on the machine. Your health care provider will set the pressuresetting and help you find the best mask for you. Tips for using the mask Because the mask needs to be snug, some people feel trapped or closed-in (claustrophobic) when first using the mask. If you feel this way, you may need to get used to the mask. One way to do this is to hold the mask loosely over your nose or mouth and then gradually apply the mask  more snugly. You can also gradually increase the amount of time that you use the mask. Masks are available in various types and sizes. If your mask does not fit well, talk with your health care provider about getting a different one. Some common types of masks include: Full face masks, which fit over the mouth and nose. Nasal masks, which fit over the nose. Nasal pillow or prong masks, which fit into the nostrils. If you are using a mask that fits over your nose and you  tend to breathe through your mouth, a chin strap may be applied to help keep your mouth closed. Use a skin barrier to protect your skin as told by your health care provider. Some CPAP and BPAP machines have alarms that may sound if the mask comes off or develops a leak. If you have trouble with the mask, it is very important that you talk with your health care provider about finding a way to make the mask easier to tolerate. Do not stop using the mask. There could be a negative impact on your health if you stop using the mask. Tips for using the machine Place your CPAP or BPAP machine on a secure table or stand near an electrical outlet. Know where the on/off switch is on the machine. Follow instructions from your health care provider about how to set the pressure on your machine and when you should use it. Do not eat or drink while the CPAP or BPAP machine is on. Food or fluids could get pushed into your lungs by the pressure of the CPAP or BPAP. For home use, CPAP and BPAP machines can be rented or purchased through home health care companies. Many different brands of machines are available. Renting a machine before purchasing may help you find out which particular machine works well for you. Your health insurance company may also decide which machine you may get. Keep the CPAP or BPAP machine and attachments clean. Ask your health care provider for specific instructions. Check the humidifier if you have a dry stuffy nose or nosebleeds. Make sure it is working correctly. Follow these instructions at home: Take over-the-counter and prescription medicines only as told by your health care provider. Ask if you can take sinus medicine if your sinuses are blocked. Do not use any products that contain nicotine or tobacco. These products include cigarettes, chewing tobacco, and vaping devices, such as e-cigarettes. If you need help quitting, ask your health care provider. Keep all follow-up visits. This is  important. Contact a health care provider if: You have redness or pressure sores on your head, face, mouth, or nose from the mask or head gear. You have trouble using the CPAP or BPAP machine. You cannot tolerate wearing the CPAP or BPAP mask. Someone tells you that you snore even when wearing your CPAP or BPAP. Get help right away if: You have trouble breathing. You feel confused. Summary CPAP and BPAP are methods that use air pressure to keep your airways open and to help you breathe well. If you have trouble with the mask, it is very important that you talk with your health care provider about finding a way to make the mask easier to tolerate. Do not stop using the mask. There could be a negative impact to your health if you stop using the mask. Follow instructions from your health care provider about when to use the machine. This information is not intended to replace advice given to you by your health care  provider. Make sure you discuss any questions you have with your healthcare provider. Document Revised: 08/04/2020 Document Reviewed: 08/04/2020 Elsevier Patient Education  2022 [employer].

## 2021-04-16 NOTE — Telephone Encounter (Signed)
Sure

## 2021-04-17 ENCOUNTER — Encounter: Payer: Self-pay | Admitting: *Deleted

## 2021-04-17 NOTE — Progress Notes (Signed)
Please let patient know her CXR was normal. S/p aortic valve repair, stable cardiac silhouette. Lungs were clear. Skeletal structure unremarkable.

## 2021-04-24 ENCOUNTER — Encounter: Payer: Self-pay | Admitting: Cardiology

## 2021-04-24 ENCOUNTER — Telehealth: Payer: Self-pay

## 2021-04-24 ENCOUNTER — Ambulatory Visit (INDEPENDENT_AMBULATORY_CARE_PROVIDER_SITE_OTHER): Payer: Medicare Other | Admitting: Cardiology

## 2021-04-24 ENCOUNTER — Other Ambulatory Visit: Payer: Self-pay

## 2021-04-24 VITALS — Ht 61.0 in | Wt 229.0 lb

## 2021-04-24 DIAGNOSIS — I110 Hypertensive heart disease with heart failure: Secondary | ICD-10-CM | POA: Diagnosis not present

## 2021-04-24 DIAGNOSIS — R42 Dizziness and giddiness: Secondary | ICD-10-CM | POA: Diagnosis not present

## 2021-04-24 DIAGNOSIS — Z954 Presence of other heart-valve replacement: Secondary | ICD-10-CM | POA: Diagnosis not present

## 2021-04-24 DIAGNOSIS — I361 Nonrheumatic tricuspid (valve) insufficiency: Secondary | ICD-10-CM

## 2021-04-24 DIAGNOSIS — I5032 Chronic diastolic (congestive) heart failure: Secondary | ICD-10-CM

## 2021-04-24 NOTE — Patient Instructions (Signed)
Medication Instructions:  Your physician recommends that you continue on your current medications as directed. Please refer to the Current Medication list given to you today.  *If you need a refill on your cardiac medications before your next appointment, please call your pharmacy*   Lab Work: None If you have labs (blood work) drawn today and your tests are completely normal, you will receive your results only by: [place] (if you have MyChart) OR A paper copy in the mail If you have any lab test that is abnormal or we need to change your treatment, we will call you to review the results.   Testing/Procedures: None   Follow-Up: At [facility], you and your health needs are our priority.  As part of our continuing mission to provide you with exceptional heart care, we have created designated Provider Care Teams.  These Care Teams include your primary Cardiologist (physician) and Advanced Practice Providers (APPs -  Physician Assistants and Nurse Practitioners) who all work together to provide you with the care you need, when you need it.  We recommend signing up for the patient portal called "MyChart".  Sign up information is provided on this After Visit Summary.  MyChart is used to connect with patients for Virtual Visits (Telemedicine).  Patients are able to view lab/test results, encounter notes, upcoming appointments, etc.  Non-urgent messages can be sent to your provider as well.   To learn more about what you can do with MyChart, go to [email or web address].    Your next appointment:   3 month(s)  The format for your next appointment:   In Person     Other Instructions

## 2021-04-24 NOTE — Progress Notes (Signed)
Cardiology Office Note:    Date:  04/24/2021   ID:  [name], DOB 1955-12-11, MRN [id number]  PCP:  [name], MD  Cardiologist:  Shirlee More, MD  Electrophysiologist:  None   Referring MD: [name], MD   Chief Complaint  Patient presents with   Hospitalization Follow-up    History of Present Illness:    [name] is a 65 y.o. female with a hx of severe symptomatic aortic stenosis of heart failure and bioprosthetic AVR #23 [place] 03/30/2013 Dr. 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  The patient follows with Dr. Bettina Gavia was last seen by him in April 2022 at that time appeared to be doing well from a cardiovascular standpoint.  No medication changes were made.  Since her last visit the patient was admitted to [facility] for acute kidney injury and orthostatic hypotension.  At the time of discharge her diuretics was adjusted.  During her hospitalization her MRI showed no acute finding.  Her vestibular assessment was negative for PNV.  There was suspicion that her visual distortion may have been delirium.  She is here today for follow-up visit.  She tells me that she is no longer experiencing the almost static hypotension but she does have significant dizziness.  She did see her PCP.   Past Medical History:  Diagnosis Date   Anemia 06/03/2019   Aortic stenosis 03/23/2013   Overview:  02/18/13 TTE EF >55%. Critical AS with mean Ao valve gradient of 82 mm Hg. No AI. No MR, PR, mild TR. Estimated RVSP 30 mm Hg.   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 Postmenopausal bleeding since for the past 2 years.  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 Last biopsy was in May of 2019 which showed weakly proliferative endometrium possibly a polyp.     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    Past Surgical History:  Procedure Laterality Date   BUBBLE STUDY  06/07/2019   Procedure: BUBBLE STUDY;  Surgeon: Buford Dresser, MD;  Location: [facility] ENDOSCOPY;  Service: Cardiovascular;;   CHOLECYSTECTOMY  1983   RIGHT/LEFT HEART CATH AND CORONARY ANGIOGRAPHY N/A 04/01/2019   Procedure:  RIGHT/LEFT HEART CATH AND CORONARY ANGIOGRAPHY;  Surgeon: Lorretta Harp, MD;  Location: [place] CV LAB;  Service: Cardiovascular;  Laterality: N/A;   TEE WITHOUT CARDIOVERSION N/A 07/01/2018   Procedure: TRANSESOPHAGEAL ECHOCARDIOGRAM (TEE);  Surgeon: Buford Dresser, MD;  Location: [facility] ENDOSCOPY;  Service: Cardiovascular;  Laterality: N/A;   TEE WITHOUT CARDIOVERSION N/A 06/07/2019    Procedure: TRANSESOPHAGEAL ECHOCARDIOGRAM (TEE);  Surgeon: Buford Dresser, MD;  Location: [facility] ENDOSCOPY;  Service: Cardiovascular;  Laterality: N/A;   TISSUE AORTIC VALVE REPLACEMENT      Current Medications: Current Meds  Medication Sig   acetaminophen (TYLENOL) 325 MG tablet Take 2 tablets (650 mg total) by mouth every 6 (six) hours as needed for mild pain or headache.   buPROPion (WELLBUTRIN XL) 150 MG 24 hr tablet Take 150 mg by mouth daily.   camphor-menthol (SARNA) lotion Apply 1 application topically as needed for itching.   DULoxetine (CYMBALTA) 60 MG capsule Take 60 mg by mouth daily.   Ferrous Sulfate Dried (HIGH POTENCY IRON) 65 MG TABS Take 1 tablet by mouth daily.   folic acid (FOLVITE) 1 MG tablet Take 1 tablet (1 mg total) by mouth daily.   levonorgestrel (MIRENA) 20 MCG/DAY IUD 1 each by Intrauterine route once.   metoprolol succinate (TOPROL-XL) 25 MG 24 hr tablet Take 25-50 mg by mouth See admin instructions. Take two of the 25 mg (total of 50 mg) tablets by mouth in the morning and then take one 25 mg tablet by mouth in the evening per patient   oxybutynin (DITROPAN-XL) 5 MG 24 hr tablet Take 5 mg by mouth every evening.   potassium chloride (KLOR-CON) 10 MEQ tablet Take 10 mEq by mouth 3 (three) times daily.   pravastatin (PRAVACHOL) 40 MG tablet Take 1 tablet (40 mg total) by mouth daily at 6 PM.   senna-docusate (SENOKOT-S) 8.6-50 MG tablet Take 1 tablet by mouth 2 (two) times daily.   torsemide (DEMADEX) 20 MG tablet Take 20 mg by mouth daily. Take 2 tablets once daily.   vitamin B-12 1000 MCG tablet Take 1 tablet (1,000 mcg total) by mouth daily.   warfarin (COUMADIN) 2.5 MG tablet Take 1 tablet by mouth once daily     Allergies:   Vancomycin   Social History   Socioeconomic History   Marital status: Married    Spouse name: Not on file   Number of children: Not on file   Years of education: Not on file   Highest education level: Not on file   Occupational History   Not on file  Tobacco Use   Smoking status: Never   Smokeless tobacco: Never  Vaping Use   Vaping Use: Never used  Substance and Sexual Activity   Alcohol use: No   Drug use: No   Sexual activity: Not Currently  Other Topics Concern   Not on file  Social History Narrative   Not on file   Social Determinants of Health   Financial Resource Strain: Not on file  Food Insecurity: Not on file  Transportation Needs: Not on file  Physical Activity: Not on file  Stress: Not on file  Social Connections: Not on file     Family History: The patient's family history includes Heart attack in her mother; Parkinson's disease in her father.  ROS:   Review of Systems  Constitution: Negative for decreased appetite, fever and weight gain.  HENT: Negative for congestion, ear discharge, hoarse voice and sore throat.   Eyes: Negative for discharge,  redness, vision loss in right eye and visual halos.  Cardiovascular: Negative for chest pain, dyspnea on exertion, leg swelling, orthopnea and palpitations.  Respiratory: Negative for cough, hemoptysis, shortness of breath and snoring.   Endocrine: Negative for heat intolerance and polyphagia.  Hematologic/Lymphatic: Negative for bleeding problem. Does not bruise/bleed easily.  Skin: Negative for flushing, nail changes, rash and suspicious lesions.  Musculoskeletal: Negative for arthritis, joint pain, muscle cramps, myalgias, neck pain and stiffness.  Gastrointestinal: Negative for abdominal pain, bowel incontinence, diarrhea and excessive appetite.  Genitourinary: Negative for decreased libido, genital sores and incomplete emptying.  Neurological: Negative for brief paralysis, focal weakness, headaches and loss of balance.  Psychiatric/Behavioral: Negative for altered mental status, depression and suicidal ideas.  Allergic/Immunologic: Negative for HIV exposure and persistent infections.    EKGs/Labs/Other Studies Reviewed:     The following studies were reviewed today:   EKG: None today  Recent Labs: 06/06/2020: NT-Pro BNP 1,079 03/23/2021: TSH 1.908 03/31/2021: ALT 15; BUN 10; Creatinine, Ser 0.75; Magnesium 2.1; Potassium 4.0; Sodium 135 04/02/2021: Hemoglobin 9.1; Platelets 362  Recent Lipid Panel    Component Value Date/Time   CHOL 123 06/04/2019 0507   CHOL 125 02/12/2019 1116   TRIG 111 06/04/2019 0507   HDL 30 (L) 06/04/2019 0507   HDL 37 (L) 02/12/2019 1116   CHOLHDL 4.1 06/04/2019 0507   VLDL 22 06/04/2019 0507   LDLCALC 71 06/04/2019 0507   LDLCALC 64 02/12/2019 1116    Physical Exam:    VS:  BP (P) 136/78 (BP Location: Left Arm, Patient Position: Sitting, Cuff Size: Normal)   Pulse (P) 70   Ht '5\' 1"'$  (1.549 m)   Wt 229 lb (103.9 kg)   SpO2 (P) 94%   BMI 43.27 kg/m     Wt Readings from Last 3 Encounters:  04/24/21 229 lb (103.9 kg)  04/16/21 226 lb (102.5 kg)  04/02/21 248 lb 0.3 oz (112.5 kg)     GEN: Well nourished, well developed in no acute distress HEENT: Normal NECK: No JVD; No carotid bruits LYMPHATICS: No lymphadenopathy CARDIAC: S1S2 noted,RRR, no murmurs, rubs, gallops RESPIRATORY:  Clear to auscultation without rales, wheezing or rhonchi  ABDOMEN: Soft, non-tender, non-distended, +bowel sounds, no guarding. EXTREMITIES: No edema, No cyanosis, no clubbing MUSCULOSKELETAL:  No deformity  SKIN: Warm and dry NEUROLOGIC:  Alert and oriented x 3, non-focal PSYCHIATRIC:  Normal affect, good insight  ASSESSMENT:    1. Dizziness   2. Chronic diastolic CHF (congestive heart failure) ([place])   3. Hypertensive heart disease with heart failure ([place])   4. H/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   5. Nonrheumatic tricuspid valve regurgitation    PLAN:     1.  From a cardiovascular standpoint no medication changes will be made.  She will stay on her current dose of diuretics.  She recently had blood work with her PCP she tells me she was hypokalemic and her  potassium has been adjusted.  Clinically she appears euvolemic in the office, despite left leg lymphedema.  2.  I do believe that evaluation for ENT will be in order for this patient.  We have discussed that if she is agreeable to see ENT for now.  3.  She will continue to follow with our Coumadin clinic her INR checks.  The patient is in agreement with the above plan. The patient left the office in stable condition.  The patient will follow up in   Medication Adjustments/Labs and Tests Ordered: Current medicines  are reviewed at length with the patient today.  Concerns regarding medicines are outlined above.  Orders Placed This Encounter  Procedures   Ambulatory referral to ENT   No orders of the defined types were placed in this encounter.   Patient Instructions  Medication Instructions:  Your physician recommends that you continue on your current medications as directed. Please refer to the Current Medication list given to you today.  *If you need a refill on your cardiac medications before your next appointment, please call your pharmacy*   Lab Work: None If you have labs (blood work) drawn today and your tests are completely normal, you will receive your results only by: [place] (if you have MyChart) OR A paper copy in the mail If you have any lab test that is abnormal or we need to change your treatment, we will call you to review the results.   Testing/Procedures: None   Follow-Up: At [facility], you and your health needs are our priority.  As part of our continuing mission to provide you with exceptional heart care, we have created designated Provider Care Teams.  These Care Teams include your primary Cardiologist (physician) and Advanced Practice Providers (APPs -  Physician Assistants and Nurse Practitioners) who all work together to provide you with the care you need, when you need it.  We recommend signing up for the patient portal called "MyChart".   Sign up information is provided on this After Visit Summary.  MyChart is used to connect with patients for Virtual Visits (Telemedicine).  Patients are able to view lab/test results, encounter notes, upcoming appointments, etc.  Non-urgent messages can be sent to your provider as well.   To learn more about what you can do with MyChart, go to [email or web address].    Your next appointment:   3 month(s)  The format for your next appointment:   In Person     Other Instructions    Adopting a Healthy Lifestyle.  Know what a healthy weight is for you (roughly BMI <25) and aim to maintain this   Aim for 7+ servings of fruits and vegetables daily   65-80+ fluid ounces of water or unsweet tea for healthy kidneys   Limit to max 1 drink of alcohol per day; avoid smoking/tobacco   Limit animal fats in diet for cholesterol and heart health - choose grass fed whenever available   Avoid highly processed foods, and foods high in saturated/trans fats   Aim for low stress - take time to unwind and care for your mental health   Aim for 150 min of moderate intensity exercise weekly for heart health, and weights twice weekly for bone health   Aim for 7-9 hours of sleep daily   When it comes to diets, agreement about the perfect plan isnt easy to find, even among the experts. Experts at the [employer] developed an idea known as the Healthy Eating Plate. Just imagine a plate divided into logical, healthy portions.   The emphasis is on diet quality:   Load up on vegetables and fruits - one-half of your plate: Aim for color and variety, and remember that potatoes dont count.   Go for whole grains - one-quarter of your plate: Whole wheat, barley, wheat berries, quinoa, oats, brown rice, and foods made with them. If you want pasta, go with whole wheat pasta.   Protein power - one-quarter of your plate: Fish, chicken, beans, and nuts are all healthy, versatile protein  sources. Limit red meat.   The diet, however, does go beyond the plate, offering a few other suggestions.   Use healthy plant oils, such as olive, canola, soy, corn, sunflower and peanut. Check the labels, and avoid partially hydrogenated oil, which have unhealthy trans fats.   If youre thirsty, drink water. Coffee and tea are good in moderation, but skip sugary drinks and limit milk and dairy products to one or two daily servings.   The type of carbohydrate in the diet is more important than the amount. Some sources of carbohydrates, such as vegetables, fruits, whole grains, and beans-are healthier than others.   Finally, stay active  Signed, Berniece Salines, DO  04/24/2021 11:55 AM    [place]

## 2021-04-24 NOTE — Telephone Encounter (Signed)
I spoke to the patient and she will have [facility] nurse check INR on 8/17.  She will call with results.

## 2021-04-25 ENCOUNTER — Ambulatory Visit (INDEPENDENT_AMBULATORY_CARE_PROVIDER_SITE_OTHER): Payer: Medicare Other | Admitting: Cardiology

## 2021-04-25 DIAGNOSIS — Z7901 Long term (current) use of anticoagulants: Secondary | ICD-10-CM | POA: Diagnosis not present

## 2021-04-25 DIAGNOSIS — Z954 Presence of other heart-valve replacement: Secondary | ICD-10-CM

## 2021-04-25 DIAGNOSIS — G459 Transient cerebral ischemic attack, unspecified: Secondary | ICD-10-CM

## 2021-04-25 LAB — PROTIME-INR: INR: 3.8 — AB (ref 0.9–1.1)

## 2021-05-02 ENCOUNTER — Ambulatory Visit (INDEPENDENT_AMBULATORY_CARE_PROVIDER_SITE_OTHER): Payer: Medicare Other | Admitting: Cardiology

## 2021-05-02 ENCOUNTER — Telehealth: Payer: Self-pay | Admitting: Cardiology

## 2021-05-02 DIAGNOSIS — Z7901 Long term (current) use of anticoagulants: Secondary | ICD-10-CM | POA: Diagnosis not present

## 2021-05-02 DIAGNOSIS — G459 Transient cerebral ischemic attack, unspecified: Secondary | ICD-10-CM

## 2021-05-02 DIAGNOSIS — Z954 Presence of other heart-valve replacement: Secondary | ICD-10-CM

## 2021-05-02 LAB — POCT INR: INR: 4.5 — AB (ref 2.0–3.0)

## 2021-05-02 NOTE — Telephone Encounter (Signed)
Full VM 8/24

## 2021-05-02 NOTE — Telephone Encounter (Signed)
Pt c/o BP issue: STAT if pt c/o blurred vision, one-sided weakness or slurred speech  1. What are your last 5 BP readings?  05/02/21 86/58 manual cuff 106/68 automatic cuff 89/70 automatic cuff  Systolic in [id number] when standing  2. Are you having any other symptoms (ex. Dizziness, headache, blurred vision, passed out)? Denied any symptoms, but RN reports was having some sweating   3. What is your BP issue? Hypotension at visit today. Patient reported no symptoms, RN noticed swelling. States the pt advised she believed that was due to her house being "muggy"

## 2021-05-02 NOTE — Telephone Encounter (Signed)
Erline Levine calling back. She states she is holding the patient's medication until she hears back. Phone: [phone].

## 2021-05-02 NOTE — Patient Instructions (Signed)
Description   Hold today and tomorrow and then continue 0.5 tablets daily. Repeat INR in 1 week.

## 2021-05-03 NOTE — Telephone Encounter (Signed)
Recommendations reviewed with pt as per Dr. Munley's note.  Pt verbalized understanding and had no additional questions.  

## 2021-05-09 ENCOUNTER — Ambulatory Visit (INDEPENDENT_AMBULATORY_CARE_PROVIDER_SITE_OTHER): Payer: Medicare Other | Admitting: Internal Medicine

## 2021-05-09 DIAGNOSIS — Z7901 Long term (current) use of anticoagulants: Secondary | ICD-10-CM

## 2021-05-09 DIAGNOSIS — Z954 Presence of other heart-valve replacement: Secondary | ICD-10-CM

## 2021-05-09 DIAGNOSIS — G459 Transient cerebral ischemic attack, unspecified: Secondary | ICD-10-CM

## 2021-05-09 LAB — POCT INR: INR: 3 (ref 2.0–3.0)

## 2021-05-16 ENCOUNTER — Telehealth: Payer: Self-pay

## 2021-05-16 ENCOUNTER — Ambulatory Visit (INDEPENDENT_AMBULATORY_CARE_PROVIDER_SITE_OTHER): Payer: Medicare Other | Admitting: Internal Medicine

## 2021-05-16 DIAGNOSIS — G459 Transient cerebral ischemic attack, unspecified: Secondary | ICD-10-CM | POA: Diagnosis not present

## 2021-05-16 DIAGNOSIS — Z954 Presence of other heart-valve replacement: Secondary | ICD-10-CM

## 2021-05-16 DIAGNOSIS — Z7901 Long term (current) use of anticoagulants: Secondary | ICD-10-CM

## 2021-05-16 LAB — POCT INR: INR: 4.6 — AB (ref 2.0–3.0)

## 2021-05-16 MED ORDER — WARFARIN SODIUM 1 MG PO TABS: ORAL_TABLET | ORAL | 3 refills | Status: DC

## 2021-05-16 NOTE — Telephone Encounter (Signed)
LMOM FOR OVERDUE INR 

## 2021-05-23 ENCOUNTER — Ambulatory Visit (INDEPENDENT_AMBULATORY_CARE_PROVIDER_SITE_OTHER): Payer: Medicare Other | Admitting: Cardiovascular Disease

## 2021-05-23 DIAGNOSIS — Z954 Presence of other heart-valve replacement: Secondary | ICD-10-CM

## 2021-05-23 DIAGNOSIS — Z7901 Long term (current) use of anticoagulants: Secondary | ICD-10-CM | POA: Diagnosis not present

## 2021-05-23 DIAGNOSIS — G459 Transient cerebral ischemic attack, unspecified: Secondary | ICD-10-CM

## 2021-05-23 LAB — POCT INR: INR: 2.8 (ref 2.0–3.0)

## 2021-05-24 ENCOUNTER — Telehealth: Payer: Self-pay

## 2021-05-24 NOTE — Telephone Encounter (Signed)
Follow Up:    One of the fax did not include the date or his Credential. Please refax to [phone]

## 2021-05-25 NOTE — Telephone Encounter (Signed)
Fax resent at this time

## 2021-05-30 ENCOUNTER — Ambulatory Visit (INDEPENDENT_AMBULATORY_CARE_PROVIDER_SITE_OTHER): Payer: Medicare Other | Admitting: Cardiology

## 2021-05-30 DIAGNOSIS — G459 Transient cerebral ischemic attack, unspecified: Secondary | ICD-10-CM | POA: Diagnosis not present

## 2021-05-30 DIAGNOSIS — Z7901 Long term (current) use of anticoagulants: Secondary | ICD-10-CM | POA: Diagnosis not present

## 2021-05-30 DIAGNOSIS — Z954 Presence of other heart-valve replacement: Secondary | ICD-10-CM | POA: Diagnosis not present

## 2021-05-30 LAB — PROTIME-INR: INR: 3.5 — AB (ref <=1.1)

## 2021-06-07 ENCOUNTER — Ambulatory Visit (INDEPENDENT_AMBULATORY_CARE_PROVIDER_SITE_OTHER): Payer: Medicare Other | Admitting: Cardiology

## 2021-06-07 ENCOUNTER — Telehealth: Payer: Self-pay | Admitting: Cardiology

## 2021-06-07 DIAGNOSIS — Z954 Presence of other heart-valve replacement: Secondary | ICD-10-CM

## 2021-06-07 DIAGNOSIS — Z7901 Long term (current) use of anticoagulants: Secondary | ICD-10-CM

## 2021-06-07 DIAGNOSIS — G459 Transient cerebral ischemic attack, unspecified: Secondary | ICD-10-CM

## 2021-06-07 LAB — POCT INR: INR: 3.3 — AB (ref 2.0–3.0)

## 2021-06-07 NOTE — Telephone Encounter (Signed)
New message:     Patient calling to get INR report

## 2021-06-07 NOTE — Telephone Encounter (Signed)
Please see anticoag encounter from today for further documentation.  

## 2021-06-07 NOTE — Patient Instructions (Signed)
Description   Called and spoke to the [facility] nurse instructed for pt to Hold warfarin today and then start taking warfarin 1 tablet daily except for 1.5 tablets on Fridays. Recheck INR in 1.5 weeks at the [facility] [phone]

## 2021-06-17 ENCOUNTER — Other Ambulatory Visit: Payer: Self-pay

## 2021-06-17 ENCOUNTER — Ambulatory Visit (HOSPITAL_BASED_OUTPATIENT_CLINIC_OR_DEPARTMENT_OTHER): Payer: Medicare Other | Attending: Primary Care | Admitting: Internal Medicine

## 2021-06-17 VITALS — Ht 61.0 in | Wt 227.0 lb

## 2021-06-19 ENCOUNTER — Other Ambulatory Visit: Payer: Self-pay

## 2021-06-19 ENCOUNTER — Ambulatory Visit (INDEPENDENT_AMBULATORY_CARE_PROVIDER_SITE_OTHER): Payer: Medicare Other

## 2021-06-19 DIAGNOSIS — Z7901 Long term (current) use of anticoagulants: Secondary | ICD-10-CM

## 2021-06-19 DIAGNOSIS — G459 Transient cerebral ischemic attack, unspecified: Secondary | ICD-10-CM | POA: Diagnosis not present

## 2021-06-19 DIAGNOSIS — Z5181 Encounter for therapeutic drug level monitoring: Secondary | ICD-10-CM | POA: Diagnosis not present

## 2021-06-19 LAB — POCT INR: INR: 4.6 — AB (ref 2.0–3.0)

## 2021-06-19 NOTE — Patient Instructions (Signed)
Description   Hold today's dose and eat some greens and then start taking warfarin 1 tablet daily. Recheck INR in 1 week at the [facility] [phone]

## 2021-06-26 ENCOUNTER — Ambulatory Visit (INDEPENDENT_AMBULATORY_CARE_PROVIDER_SITE_OTHER): Payer: Medicare Other

## 2021-06-26 ENCOUNTER — Other Ambulatory Visit: Payer: Self-pay

## 2021-06-26 DIAGNOSIS — Z5181 Encounter for therapeutic drug level monitoring: Secondary | ICD-10-CM | POA: Diagnosis not present

## 2021-06-26 DIAGNOSIS — Z7901 Long term (current) use of anticoagulants: Secondary | ICD-10-CM | POA: Diagnosis not present

## 2021-06-26 DIAGNOSIS — G459 Transient cerebral ischemic attack, unspecified: Secondary | ICD-10-CM | POA: Diagnosis not present

## 2021-06-26 LAB — POCT INR: INR: 3.3 — AB (ref 2.0–3.0)

## 2021-06-26 NOTE — Patient Instructions (Signed)
Description   Eat some greens and then start taking warfarin 1 tablet daily. Recheck INR in 3 weeks at the [facility] [phone]

## 2021-07-15 NOTE — Progress Notes (Signed)
Cardiology Office Note:    Date:  07/16/2021   ID:  [name], DOB [birth year], MRN [id number]  PCP:  [name], MD  Cardiologist:  Shirlee More, MD    Referring MD: [name], MD    ASSESSMENT:    1. H/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   2. Long term (current) use of anticoagulants   3. Hypertensive heart disease with heart failure ([place])   4. OSA (obstructive sleep apnea)   5. 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  Concerns regarding medicines are outlined above.  No orders of the defined types were placed in this encounter.  No orders of the defined types were placed in this encounter.   Chief Complaint  Patient presents with   Follow-up    History of Present Illness:    [name] is a 65 y.o. 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  She was last seen 04/24/2021 following hospitalization . Compliance with diet, lifestyle and medications: Yes  Since discharge rhinovirus been stable is uncertain exactly what caused excessive INR but it clearly led to her anemia  and hospitalization. She is lost weight she is not having peripheral edema takes less diuretic and feels better no edema shortness of breath chest pain or syncope.  She was admitted to [facility] 03/22/2021 and discharged on 04/02/2021 with hypotension and volume contraction as well as an excessive INR.  In hospital her diuretic was held and she was giving IV fluids and hypotension resolved hypokalemia was repleted.  She was anemic with a hemoglobin of 7.3 and received 1 unit of packed cells in the hospital.  She had not been using her CPAP for several months prior to this admission.  Her EKG 03/22/2021 independently reviewed sinus rhythm with nonspecific ST-T abnormality.  Warfarin is managed through our anticoagulant clinic:  Component Ref Range & Units 1 mo ago  (06/07/21) 1 mo ago  (05/30/21) 1 mo ago  (05/30/21) 1 mo ago  (05/23/21) 2 mo ago  (05/16/21) 2 mo ago  (05/09/21) 2 mo ago  (05/02/21)  INR 2.0 - 3.0 3.3 Abnormal    R  3.5 Abnormal  R, CM  2.8 CM  4.6 Abnormal  CM  3.0 CM  4.5 Abnormal    Past Medical History:  Diagnosis Date   Anemia 06/03/2019   Aortic stenosis 03/23/2013   Overview:  02/18/13 TTE EF >55%. Critical AS with mean Ao valve gradient of 82 mm Hg. No AI. No MR, PR, mild TR. Estimated RVSP 30 mm Hg.   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 Postmenopausal bleeding since for the past 2 years.  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 Last biopsy was in May of 2019 which showed weakly proliferative endometrium possibly a polyp.     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    Past Surgical History:  Procedure Laterality Date   BUBBLE STUDY  06/07/2019   Procedure: BUBBLE STUDY;  Surgeon: Buford Dresser, MD;  Location: [facility];  Service: Cardiovascular;;   CHOLECYSTECTOMY  1983   RIGHT/LEFT HEART CATH AND CORONARY ANGIOGRAPHY N/A 04/01/2019   Procedure: RIGHT/LEFT HEART CATH AND CORONARY ANGIOGRAPHY;  Surgeon: Lorretta Harp, MD;  Location: [facility];  Service: Cardiovascular;  Laterality: N/A;   TEE WITHOUT CARDIOVERSION N/A 07/01/2018   Procedure: TRANSESOPHAGEAL ECHOCARDIOGRAM (TEE);  Surgeon: Buford Dresser, MD;  Location: [facility] ENDOSCOPY;  Service: Cardiovascular;  Laterality: N/A;   TEE WITHOUT CARDIOVERSION N/A 06/07/2019   Procedure: TRANSESOPHAGEAL ECHOCARDIOGRAM (TEE);  Surgeon: Buford Dresser, MD;  Location: [facility] ENDOSCOPY;  Service: Cardiovascular;  Laterality: N/A;   TISSUE AORTIC VALVE REPLACEMENT      Current Medications: Current Meds  Medication Sig   acetaminophen (TYLENOL) 325 MG tablet Take 2 tablets (650 mg total) by mouth every 6 (six) hours as needed for mild pain or headache.   warfarin (COUMADIN) 1 MG tablet Take 1 to 1.5 tablets by mouth daily as directed by coumadin clinic     Allergies:   Vancomycin   Social History   Socioeconomic  History   Marital status: Married    Spouse name: Not on file   Number of children: Not on file   Years of education: Not on file   Highest education level: Not on file  Occupational History   Not on file  Tobacco Use   Smoking status: Never   Smokeless tobacco: Never  Vaping Use   Vaping Use: Never used  Substance and Sexual Activity   Alcohol use: No   Drug use: No   Sexual activity: Not Currently  Other Topics Concern   Not on file  Social History Narrative   Not on file   Social Determinants of Health   Financial Resource Strain: Not on file  Food Insecurity: Not on file  Transportation Needs: Not on file  Physical Activity: Not on file  Stress: Not on file  Social Connections: Not on file     Family History: The patient's family history includes Heart attack in her mother; Parkinson's disease in her father. ROS:   Please see the history of present illness.    All other  systems reviewed and are negative.  EKGs/Labs/Other Studies Reviewed:    The following studies were reviewed today:   Recent Labs: 03/23/2021: TSH 1.908 03/31/2021: ALT 15; BUN 10; Creatinine, Ser 0.75; Magnesium 2.1; Potassium 4.0; Sodium 135 04/02/2021: Hemoglobin 9.1; Platelets 362  Recent Lipid Panel    Component Value Date/Time   CHOL 123 06/04/2019 0507   CHOL 125 02/12/2019 1116   TRIG 111 06/04/2019 0507   HDL 30 (L) 06/04/2019 0507   HDL 37 (L) 02/12/2019 1116   CHOLHDL 4.1 06/04/2019 0507   VLDL 22 06/04/2019 0507   LDLCALC 71 06/04/2019 0507   LDLCALC 64 02/12/2019 1116    Physical Exam:    VS:  Ht 5\' 1"  (1.549 m)   Wt 232 lb (105.2 kg)   BMI 43.84 kg/m     Wt Readings from Last 3 Encounters:  07/16/21 232 lb (105.2 kg)  06/17/21 227 lb (103 kg)  04/24/21 229 lb (103.9 kg)     GEN: She looks markedly improved she has had a significant weight loss well nourished, well developed in no acute distress HEENT: Normal NECK: No JVD; No carotid bruits LYMPHATICS: No  lymphadenopathy CARDIAC: She has a grade 2/6 ejection murmur aortic area S2 normal no AR RRR, RESPIRATORY:  Clear to auscultation without rales, wheezing or rhonchi  ABDOMEN: Soft, non-tender, non-distended MUSCULOSKELETAL:  No edema; No deformity  SKIN: Warm and dry NEUROLOGIC:  Alert and oriented x 3 PSYCHIATRIC:  Normal affect    Signed, Shirlee More, MD  07/16/2021 2:04 PM    [place]

## 2021-07-16 ENCOUNTER — Ambulatory Visit (INDEPENDENT_AMBULATORY_CARE_PROVIDER_SITE_OTHER): Payer: Medicare Other | Admitting: Cardiology

## 2021-07-16 ENCOUNTER — Other Ambulatory Visit: Payer: Self-pay

## 2021-07-16 ENCOUNTER — Encounter: Payer: Self-pay | Admitting: Cardiology

## 2021-07-16 VITALS — BP 140/80 | HR 80 | Ht 61.0 in | Wt 232.0 lb

## 2021-07-16 DIAGNOSIS — Z7901 Long term (current) use of anticoagulants: Secondary | ICD-10-CM

## 2021-07-16 DIAGNOSIS — Z23 Encounter for immunization: Secondary | ICD-10-CM | POA: Diagnosis not present

## 2021-07-16 DIAGNOSIS — I110 Hypertensive heart disease with heart failure: Secondary | ICD-10-CM | POA: Diagnosis not present

## 2021-07-16 DIAGNOSIS — G4733 Obstructive sleep apnea (adult) (pediatric): Secondary | ICD-10-CM | POA: Diagnosis not present

## 2021-07-16 DIAGNOSIS — Z954 Presence of other heart-valve replacement: Secondary | ICD-10-CM | POA: Diagnosis not present

## 2021-07-16 DIAGNOSIS — D500 Iron deficiency anemia secondary to blood loss (chronic): Secondary | ICD-10-CM

## 2021-07-16 NOTE — Patient Instructions (Signed)
Medication Instructions:  Your physician recommends that you continue on your current medications as directed. Please refer to the Current Medication list given to you today.  *If you need a refill on your cardiac medications before your next appointment, please call your pharmacy*   Lab Work: Your physician recommends that you return for lab work in: TODAY CBC, CMP, Lipids If you have labs (blood work) drawn today and your tests are completely normal, you will receive your results only by: [place] (if you have [place]) OR A paper copy in the mail If you have any lab test that is abnormal or we need to change your treatment, we will call you to review the results.   Testing/Procedures: None   Follow-Up: At [facility], you and your health needs are our priority.  As part of our continuing mission to provide you with exceptional heart care, we have created designated Provider Care Teams.  These Care Teams include your primary Cardiologist (physician) and Advanced Practice Providers (APPs -  Physician Assistants and Nurse Practitioners) who all work together to provide you with the care you need, when you need it.  We recommend signing up for the patient portal called "MyChart".  Sign up information is provided on this After Visit Summary.  MyChart is used to connect with patients for Virtual Visits (Telemedicine).  Patients are able to view lab/test results, encounter notes, upcoming appointments, etc.  Non-urgent messages can be sent to your provider as well.   To learn more about what you can do with MyChart, go to [email or web address].    Your next appointment:   6 month(s)  The format for your next appointment:   In Person  Provider:   Shirlee More, MD    Other Instructions

## 2021-07-17 ENCOUNTER — Telehealth: Payer: Self-pay

## 2021-07-17 DIAGNOSIS — D509 Iron deficiency anemia, unspecified: Secondary | ICD-10-CM

## 2021-07-17 LAB — COMPREHENSIVE METABOLIC PANEL
ALT: 15 IU/L (ref 0–32)
AST: 20 IU/L (ref 0–40)
Albumin/Globulin Ratio: 1.4 (ref 1.2–2.2)
Albumin: 3.9 g/dL (ref 3.8–4.8)
Alkaline Phosphatase: 68 IU/L (ref 44–121)
BUN/Creatinine Ratio: 15 (ref 12–28)
BUN: 14 mg/dL (ref 8–27)
Bilirubin Total: 0.3 mg/dL (ref 0.0–1.2)
CO2: 23 mmol/L (ref 20–29)
Calcium: 9.7 mg/dL (ref 8.7–10.3)
Chloride: 107 mmol/L — ABNORMAL HIGH (ref 96–106)
Creatinine, Ser: 0.93 mg/dL (ref 0.57–1.00)
Globulin, Total: 2.7 g/dL (ref 1.5–4.5)
Glucose: 98 mg/dL (ref 70–99)
Potassium: 4.5 mmol/L (ref 3.5–5.2)
Sodium: 142 mmol/L (ref 134–144)
Total Protein: 6.6 g/dL (ref 6.0–8.5)
eGFR: 68 mL/min/{1.73_m2} (ref >59)

## 2021-07-17 LAB — CBC
Hematocrit: 40.7 % (ref 34.0–46.6)
Hemoglobin: 13.1 g/dL (ref 11.1–15.9)
MCH: 28.4 pg (ref 26.6–33.0)
MCHC: 32.2 g/dL (ref 31.5–35.7)
MCV: 88 fL (ref 79–97)
Platelets: 240 10*3/uL (ref 150–450)
RBC: 4.62 x10E6/uL (ref 3.77–5.28)
RDW: 14.2 % (ref 11.7–15.4)
WBC: 10.6 10*3/uL (ref 3.4–10.8)

## 2021-07-17 LAB — LIPID PANEL
Chol/HDL Ratio: 4.2 ratio (ref 0.0–4.4)
Cholesterol, Total: 176 mg/dL (ref 100–199)
HDL: 42 mg/dL (ref >39)
LDL Chol Calc (NIH): 106 mg/dL — ABNORMAL HIGH (ref 0–99)
Triglycerides: 160 mg/dL — ABNORMAL HIGH (ref 0–149)
VLDL Cholesterol Cal: 28 mg/dL (ref 5–40)

## 2021-07-17 NOTE — Telephone Encounter (Signed)
-----   Message from Richardo Priest, MD sent at 07/17/2021  9:05 AM EST ----- I suspect she needs to receive iron again she can go back to who she had seen at [facility] or if she would like I can refer her to hematology here at [facility].

## 2021-07-17 NOTE — Telephone Encounter (Signed)
Pt missed appt to have INR checked. Called, no answer. Left voicemail.

## 2021-07-17 NOTE — Telephone Encounter (Signed)
Spoke with patient regarding results and recommendation.  Patient verbalizes understanding and is agreeable to plan of care. Advised patient to call back with any issues or concerns.    She requests to go to [place] and this referral was placed for her.

## 2021-07-19 ENCOUNTER — Ambulatory Visit: Payer: BC Managed Care – PPO | Admitting: Internal Medicine

## 2021-07-19 ENCOUNTER — Telehealth: Payer: Self-pay | Admitting: Oncology

## 2021-07-19 NOTE — Telephone Encounter (Signed)
Scheduled appt per 11/8 referral. Pt is aware of appt date and time.

## 2021-07-26 ENCOUNTER — Telehealth: Payer: Self-pay

## 2021-07-26 NOTE — Telephone Encounter (Signed)
Lpmtcb and schedule INR check in [place] office.

## 2021-08-06 ENCOUNTER — Other Ambulatory Visit: Payer: Self-pay | Admitting: Oncology

## 2021-08-06 DIAGNOSIS — D539 Nutritional anemia, unspecified: Secondary | ICD-10-CM

## 2021-08-06 DIAGNOSIS — D508 Other iron deficiency anemias: Secondary | ICD-10-CM

## 2021-08-06 NOTE — Progress Notes (Signed)
[name]  [address] [place],  [place]  [address] [phone]  Clinic Day:  08/07/2021  Referring physician: [name], MD   HISTORY OF PRESENT ILLNESS:  The patient is a 65 y.o. female  who I was asked to consult upon for iron deficiency anemia.  However, recent labs showed a hemoglobin of 13.1, with an MCV of 88.  The patient claims she had issues with iron deficiency anemia 2 years ago due to heavy bleeding from fibroids.  At that time, she recalls receiving 2 blood transfusions, as well as IV iron.  An IUD was placed at that time; since then, she has not had any further problems with vaginal bleeding.  She also denies having any other overt forms of blood loss over these past months/years.  Of note, she has never undergone a colonoscopy.   PAST MEDICAL HISTORY:   Past Medical History:  Diagnosis Date   Anemia 06/03/2019   Aortic stenosis 03/23/2013   Overview:  02/18/13 TTE EF >55%. Critical AS with mean Ao valve gradient of 82 mm Hg. No AI. No MR, PR, mild TR. Estimated RVSP 30 mm Hg.   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 Postmenopausal bleeding since for the past 2 years.  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 Last biopsy was in May of 2019 which showed weakly proliferative endometrium possibly a polyp.     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    PAST SURGICAL HISTORY:   Past Surgical History:  Procedure Laterality Date   BUBBLE STUDY  06/07/2019   Procedure: BUBBLE STUDY;  Surgeon: Buford Dresser, MD;  Location: [facility];  Service: Cardiovascular;;   CHOLECYSTECTOMY  1983   RIGHT/LEFT HEART CATH AND CORONARY ANGIOGRAPHY N/A 04/01/2019   Procedure: RIGHT/LEFT HEART CATH AND CORONARY ANGIOGRAPHY;  Surgeon: Lorretta Harp, MD;  Location: [facility];  Service: Cardiovascular;  Laterality: N/A;   TEE WITHOUT CARDIOVERSION N/A 07/01/2018   Procedure: TRANSESOPHAGEAL ECHOCARDIOGRAM (TEE);  Surgeon: Buford Dresser, MD;  Location: [facility] ENDOSCOPY;  Service: Cardiovascular;  Laterality: N/A;   TEE WITHOUT CARDIOVERSION N/A 06/07/2019   Procedure: TRANSESOPHAGEAL ECHOCARDIOGRAM (TEE);  Surgeon: Buford Dresser, MD;  Location: [facility] ENDOSCOPY;  Service: Cardiovascular;  Laterality: N/A;   TISSUE AORTIC VALVE REPLACEMENT      CURRENT MEDICATIONS:   Current Outpatient Medications  Medication Sig Dispense Refill   acetaminophen (TYLENOL) 325 MG tablet Take 2 tablets (  650 mg total) by mouth every 6 (six) hours as needed for mild pain or headache. 30 tablet    buPROPion (WELLBUTRIN XL) 300 MG 24 hr tablet Take 300 mg by mouth daily.     camphor-menthol (SARNA) lotion Apply 1 application topically as needed for itching. 222 mL 0   docusate sodium (COLACE) 100 MG capsule Take 200 mg by mouth daily.     DULoxetine (CYMBALTA) 60 MG  capsule Take 60 mg by mouth daily.     Ferrous Sulfate Dried (HIGH POTENCY IRON) 65 MG TABS Take 1 tablet by mouth daily.     folic acid (FOLVITE) 1 MG tablet Take 1 tablet (1 mg total) by mouth daily.     levonorgestrel (MIRENA) 20 MCG/DAY IUD 1 each by Intrauterine route once.     metoprolol succinate (TOPROL-XL) 25 MG 24 hr tablet Take 25-50 mg by mouth See admin instructions. Take two of the 25 mg (total of 50 mg) tablets by mouth in the morning and then take one 25 mg tablet by mouth in the evening per patient     oxybutynin (DITROPAN-XL) 5 MG 24 hr tablet Take 5 mg by mouth every evening.     potassium chloride (KLOR-CON) 10 MEQ tablet Take 10 mEq by mouth 3 (three) times daily.     pravastatin (PRAVACHOL) 40 MG tablet Take 1 tablet (40 mg total) by mouth daily at 6 PM. 30 tablet 0   senna-docusate (SENOKOT-S) 8.6-50 MG tablet Take 1 tablet by mouth 2 (two) times daily.     torsemide (DEMADEX) 20 MG tablet Take 20 mg by mouth daily.     vitamin B-12 1000 MCG tablet Take 1 tablet (1,000 mcg total) by mouth daily.     warfarin (COUMADIN) 1 MG tablet Take 1 to 1.5 tablets by mouth daily as directed by coumadin clinic 45 tablet 3   No current facility-administered medications for this visit.    ALLERGIES:   Allergies  Allergen Reactions   Vancomycin Itching    Patient can receive vancomycin with slower infusion and prn benadryl for itching     FAMILY HISTORY:   Family History  Problem Relation Age of Onset   Parkinson's disease Father    Heart attack Mother   Her maternal grandmother had breast cancer.  SOCIAL HISTORY:  The patient was born and raised in [place].  She currently lives in town with her husband of 7 years.  She has 2 children and 1 grandchild.  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  There is no history of alcoholism or tobacco abuse.    REVIEW OF SYSTEMS:  Review of Systems  Constitutional:  Negative for fatigue and fever.  HENT:    Negative for hearing loss and sore throat.   Eyes:  Negative for eye problems.  Respiratory:  Negative for chest tightness, cough and hemoptysis.   Cardiovascular:  Negative for chest pain and palpitations.  Gastrointestinal:  Negative for abdominal distention, abdominal pain, blood in stool, constipation, diarrhea, nausea and vomiting.  Endocrine: Negative for hot flashes.  Genitourinary:  Negative for difficulty urinating, dysuria, frequency, hematuria and nocturia.   Musculoskeletal:  Negative for arthralgias, back pain, gait problem and myalgias.  Skin:  Positive for wound. Negative for itching and rash.  Neurological: Negative.  Negative for dizziness, extremity weakness, gait problem, headaches, light-headedness and numbness.  Hematological: Negative.   Psychiatric/Behavioral:  Positive for depression. Negative for suicidal ideas. The patient is not  nervous/anxious.     PHYSICAL EXAM:  Blood pressure 134/82, pulse 76, temperature 97.9 F (36.6 C), resp. rate 16, height 5\' 1"  (1.549 m), weight 217 lb 9.6 oz (98.7 kg), SpO2 94 %. Wt Readings from Last 3 Encounters:  08/07/21 217 lb 9.6 oz (98.7 kg)  07/16/21 232 lb (105.2 kg)  06/17/21 227 lb (103 kg)   Body mass index is 41.12 kg/m. Performance status (ECOG): 1 - Symptomatic but completely ambulatory Physical Exam Constitutional:      Appearance: Normal appearance. She is not ill-appearing.  HENT:     Mouth/Throat:     Mouth: Mucous membranes are moist.     Pharynx: Oropharynx is clear. No oropharyngeal exudate or posterior oropharyngeal erythema.  Cardiovascular:     Rate and Rhythm: Normal rate and regular rhythm.     Heart sounds: No murmur heard.   No friction rub. No gallop.  Pulmonary:     Effort: Pulmonary effort is normal. No respiratory distress.     Breath sounds: Normal breath sounds. No wheezing, rhonchi or rales.  Abdominal:     General: Bowel sounds are normal. There is no distension.     Palpations:  Abdomen is soft. There is no mass.     Tenderness: There is no abdominal tenderness.  Musculoskeletal:        General: No swelling.     Right lower leg: No edema.     Left lower leg: No edema.  Lymphadenopathy:     Cervical: No cervical adenopathy.     Upper Body:     Right upper body: No supraclavicular or axillary adenopathy.     Left upper body: No supraclavicular or axillary adenopathy.     Lower Body: No right inguinal adenopathy. No left inguinal adenopathy.  Skin:    General: Skin is warm.     Coloration: Skin is not jaundiced.     Findings: Rash present. No lesion.     Comments: Skin changes consistent with chronic venous insufficiency are seen over her bilateral lower legs.  There is mild skin breakdown in her left lower leg  Neurological:     General: No focal deficit present.     Mental Status: She is alert and oriented to person, place, and time. Mental status is at baseline.  Psychiatric:        Mood and Affect: Mood normal.        Behavior: Behavior normal.        Thought Content: Thought content normal.    LABS:   CBC Latest Ref Rng & Units 08/07/2021 07/16/2021 04/02/2021  WBC - 13.0 10.6 13.4(H)  Hemoglobin 12.0 - 16.0 15.7 13.1 9.1(L)  Hematocrit 36 - 46 48(A) 40.7 29.9(L)  Platelets 150 - 399 254 240 362    Latest Reference Range & Units 08/07/21 13:58  Iron 28 - 170 ug/dL 59  UIBC ug/dL 272  TIBC 250 - 450 ug/dL 331  Saturation Ratios 10.4 - 31.8 % 18  Ferritin 11 - 307 ng/mL 137  Folate >5.9 ng/mL 17.4  Vitamin B12 180 - 914 pg/mL 629   CMP Latest Ref Rng & Units 08/07/2021 07/16/2021 03/31/2021  Glucose 70 - 99 mg/dL - 98 88  BUN 4 - 21 21 14 10   Creatinine 0.5 - 1.1 1.2(A) 0.93 0.75  Sodium 137 - 147 139 142 135  Potassium 3.4 - 5.3 4.4 4.5 4.0  Chloride 99 - 108 102 107(H) 105  CO2 13 - 22 26(A) 23 23  Calcium 8.7 - 10.7 9.6 9.7 9.4  Total Protein 6.3 - 8.2 g/dL 8.4(A) 6.6 6.1(L)  Total Bilirubin 0.0 - 1.2 mg/dL - 0.3 1.8(H)  Alkaline Phos 25  - 125 66 68 48  AST 13 - 35 26 20 17   ALT 7 - 35 17 15 15   .. ASSESSMENT & PLAN:  A 65 y.o. female who I was asked to consult upon for iron deficiency anemia.  When evaluating her labs today, her hemoglobin and all of her iron parameters are completely normal.  There is nothing to suggest her previous iron deficiency is currently present.  On a side note, this patient's white count is minimally elevated.  This may be related to the skin changes/breakdown in her lower legs.  Other than that, I do not appreciate any obvious hematologic issues which need to be followed further.  I do feel comfortable turning her care back over to her other physicians.  I would not have a problem seeing her in the future if new hematologic issues develop that require repeat clinical assessment.  The patient understands all the plans discussed today and is in agreement with them.  I do appreciate [name], MD for his new consult.   Roselina Burgueno Macarthur Critchley, MD

## 2021-08-07 ENCOUNTER — Inpatient Hospital Stay: Payer: Medicare Other | Attending: Oncology | Admitting: Oncology

## 2021-08-07 ENCOUNTER — Ambulatory Visit (INDEPENDENT_AMBULATORY_CARE_PROVIDER_SITE_OTHER): Payer: Medicare Other

## 2021-08-07 ENCOUNTER — Inpatient Hospital Stay: Payer: Medicare Other

## 2021-08-07 ENCOUNTER — Other Ambulatory Visit: Payer: Self-pay | Admitting: Hematology and Oncology

## 2021-08-07 ENCOUNTER — Other Ambulatory Visit: Payer: Self-pay

## 2021-08-07 VITALS — BP 134/82 | HR 76 | Temp 97.9°F | Resp 16 | Ht 61.0 in | Wt 217.6 lb

## 2021-08-07 DIAGNOSIS — Z7901 Long term (current) use of anticoagulants: Secondary | ICD-10-CM | POA: Insufficient documentation

## 2021-08-07 DIAGNOSIS — Z881 Allergy status to other antibiotic agents status: Secondary | ICD-10-CM | POA: Insufficient documentation

## 2021-08-07 DIAGNOSIS — D509 Iron deficiency anemia, unspecified: Secondary | ICD-10-CM | POA: Diagnosis not present

## 2021-08-07 DIAGNOSIS — Z818 Family history of other mental and behavioral disorders: Secondary | ICD-10-CM | POA: Insufficient documentation

## 2021-08-07 DIAGNOSIS — Z8249 Family history of ischemic heart disease and other diseases of the circulatory system: Secondary | ICD-10-CM | POA: Insufficient documentation

## 2021-08-07 DIAGNOSIS — Z954 Presence of other heart-valve replacement: Secondary | ICD-10-CM | POA: Diagnosis not present

## 2021-08-07 DIAGNOSIS — Z8673 Personal history of transient ischemic attack (TIA), and cerebral infarction without residual deficits: Secondary | ICD-10-CM | POA: Insufficient documentation

## 2021-08-07 DIAGNOSIS — R21 Rash and other nonspecific skin eruption: Secondary | ICD-10-CM | POA: Diagnosis not present

## 2021-08-07 DIAGNOSIS — D500 Iron deficiency anemia secondary to blood loss (chronic): Secondary | ICD-10-CM

## 2021-08-07 DIAGNOSIS — G459 Transient cerebral ischemic attack, unspecified: Secondary | ICD-10-CM | POA: Diagnosis not present

## 2021-08-07 DIAGNOSIS — F32A Depression, unspecified: Secondary | ICD-10-CM

## 2021-08-07 DIAGNOSIS — Z79899 Other long term (current) drug therapy: Secondary | ICD-10-CM | POA: Diagnosis not present

## 2021-08-07 DIAGNOSIS — Z9049 Acquired absence of other specified parts of digestive tract: Secondary | ICD-10-CM | POA: Diagnosis not present

## 2021-08-07 DIAGNOSIS — Z803 Family history of malignant neoplasm of breast: Secondary | ICD-10-CM | POA: Insufficient documentation

## 2021-08-07 DIAGNOSIS — D508 Other iron deficiency anemias: Secondary | ICD-10-CM

## 2021-08-07 DIAGNOSIS — D539 Nutritional anemia, unspecified: Secondary | ICD-10-CM

## 2021-08-07 DIAGNOSIS — Z5181 Encounter for therapeutic drug level monitoring: Secondary | ICD-10-CM

## 2021-08-07 LAB — IRON AND TIBC
Iron: 59 ug/dL (ref 28–170)
Saturation Ratios: 18 % (ref 10.4–31.8)
TIBC: 331 ug/dL (ref 250–450)
UIBC: 272 ug/dL

## 2021-08-07 LAB — BASIC METABOLIC PANEL
BUN: 21 (ref 4–21)
CO2: 26 — AB (ref 13–22)
Chloride: 102 (ref 99–108)
Creatinine: 1.2 — AB (ref 0.5–1.1)
Glucose: 136
Potassium: 4.4 (ref 3.4–5.3)
Sodium: 139 (ref 137–147)

## 2021-08-07 LAB — CBC AND DIFFERENTIAL
HCT: 48 — AB (ref 36–46)
Hemoglobin: 15.7 (ref 12.0–16.0)
Neutrophils Absolute: 10.14
Platelets: 254 (ref 150–399)
WBC: 13

## 2021-08-07 LAB — HEPATIC FUNCTION PANEL
ALT: 17 (ref 7–35)
AST: 26 (ref 13–35)
Alkaline Phosphatase: 66 (ref 25–125)
Bilirubin, Total: 0.8

## 2021-08-07 LAB — FOLATE: Folate: 17.4 ng/mL (ref >5.9)

## 2021-08-07 LAB — CBC
MCV: 88 (ref 81–99)
RBC: 5.45 — AB (ref 3.87–5.11)

## 2021-08-07 LAB — COMPREHENSIVE METABOLIC PANEL
Albumin: 4.4 (ref 3.5–5.0)
Calcium: 9.6 (ref 8.7–10.7)

## 2021-08-07 LAB — VITAMIN B12: Vitamin B-12: 629 pg/mL (ref 180–914)

## 2021-08-07 LAB — PROTEIN, TOTAL: Total Protein: 8.4 g/dL — AB (ref 6.3–8.2)

## 2021-08-07 LAB — POCT INR: INR: 2.4 (ref 2.0–3.0)

## 2021-08-07 LAB — FERRITIN: Ferritin: 137 ng/mL (ref 11–307)

## 2021-08-07 NOTE — Patient Instructions (Signed)
Description   Continue taking warfarin 1 tablet daily. Recheck INR in 5 weeks at the [facility]. Coumadin Clinic [phone]

## 2021-08-08 ENCOUNTER — Telehealth: Payer: Self-pay

## 2021-08-08 NOTE — Telephone Encounter (Signed)
Pt notified of below. She verbalized understanding.  ASSESSMENT & PLAN:  A 65 y.o. female who I was asked to consult upon for iron deficiency anemia.  When evaluating her labs today, her hemoglobin and all of her iron parameters are completely normal.  There is nothing to suggest her previous iron deficiency is currently present.  On a side note, this patient's white count is minimally elevated.  This may be related to the skin changes/breakdown in her lower legs.  Other than that, I do not appreciate any obvious hematologic issues which need to be followed further.  I do feel comfortable turning her care back over to her other physicians.  I would not have a problem seeing her in the future if new hematologic issues develop that require repeat clinical assessment.  The patient understands all the plans discussed today and is in agreement with them.   I do appreciate [name], MD for his new consult.    Dequincy Macarthur Critchley, MD

## 2021-08-20 ENCOUNTER — Telehealth: Payer: Self-pay | Admitting: Oncology

## 2021-08-20 NOTE — Telephone Encounter (Signed)
Per 11/29 Note - Patient care turned back to referring Provider

## 2021-10-02 ENCOUNTER — Ambulatory Visit (INDEPENDENT_AMBULATORY_CARE_PROVIDER_SITE_OTHER): Payer: Medicare Other

## 2021-10-02 ENCOUNTER — Other Ambulatory Visit: Payer: Self-pay

## 2021-10-02 DIAGNOSIS — Z7901 Long term (current) use of anticoagulants: Secondary | ICD-10-CM

## 2021-10-02 DIAGNOSIS — Z5181 Encounter for therapeutic drug level monitoring: Secondary | ICD-10-CM | POA: Diagnosis not present

## 2021-10-02 DIAGNOSIS — G459 Transient cerebral ischemic attack, unspecified: Secondary | ICD-10-CM | POA: Diagnosis not present

## 2021-10-02 LAB — PROTIME-INR
INR: 8.1 (ref 0.9–1.2)
Prothrombin Time: 76.5 s — ABNORMAL HIGH (ref 9.1–12.0)

## 2021-10-02 LAB — POCT INR: INR: 8 — AB (ref 2.0–3.0)

## 2021-10-02 NOTE — Patient Instructions (Signed)
Description   10/02/21 - 1542: POC INR >8.0. Pt sent to lab. Instructed to hold Warfarin until further notice.  10/02/21 - 1706: Called lab and confirm specimen was obtained. Lab stated results are not available yet. Called pt and instructed to eat greens and hold Warfarin until further notice. Also instructed pt to seek nearest emergency department If bleeding occurs.  10/02/21 - 1841: Spoke with [facility]. INR 8.1 - Called pt and instructed to hold Warfarin for 4 days and resume Warfarin 1 tablet daily starting Saturday 10/06/21. Pt verbalized understanding.  Recheck INR in 1 week at the [facility].  Coumadin Clinic [phone]

## 2021-10-09 ENCOUNTER — Other Ambulatory Visit: Payer: Self-pay

## 2021-10-09 ENCOUNTER — Ambulatory Visit (INDEPENDENT_AMBULATORY_CARE_PROVIDER_SITE_OTHER): Payer: Medicare Other

## 2021-10-09 DIAGNOSIS — Z7901 Long term (current) use of anticoagulants: Secondary | ICD-10-CM | POA: Diagnosis not present

## 2021-10-09 DIAGNOSIS — Z5181 Encounter for therapeutic drug level monitoring: Secondary | ICD-10-CM | POA: Diagnosis not present

## 2021-10-09 DIAGNOSIS — G459 Transient cerebral ischemic attack, unspecified: Secondary | ICD-10-CM | POA: Diagnosis not present

## 2021-10-09 LAB — POCT INR: INR: 2.2 (ref 2.0–3.0)

## 2021-10-09 NOTE — Patient Instructions (Signed)
Description   Continue taking Warfarin 1 tablet daily.  Stay consistent with greens (3 times per week)  Recheck INR in 2 weeks at the [facility].  Coumadin Clinic [phone]

## 2021-10-23 ENCOUNTER — Other Ambulatory Visit: Payer: Self-pay

## 2021-10-23 ENCOUNTER — Ambulatory Visit (INDEPENDENT_AMBULATORY_CARE_PROVIDER_SITE_OTHER): Payer: Medicare Other

## 2021-10-23 DIAGNOSIS — G459 Transient cerebral ischemic attack, unspecified: Secondary | ICD-10-CM | POA: Diagnosis not present

## 2021-10-23 DIAGNOSIS — Z5181 Encounter for therapeutic drug level monitoring: Secondary | ICD-10-CM | POA: Diagnosis not present

## 2021-10-23 DIAGNOSIS — Z7901 Long term (current) use of anticoagulants: Secondary | ICD-10-CM

## 2021-10-23 LAB — POCT INR: INR: 2.1 (ref 2.0–3.0)

## 2021-10-23 NOTE — Patient Instructions (Signed)
Description   Continue taking Warfarin 1 tablet daily.  Stay consistent with greens (3 times per week)  Recheck INR in 4 weeks at the [facility].  Coumadin Clinic [phone]

## 2021-11-10 ENCOUNTER — Other Ambulatory Visit: Payer: Self-pay | Admitting: Cardiology

## 2021-11-20 ENCOUNTER — Other Ambulatory Visit: Payer: Self-pay

## 2021-11-20 ENCOUNTER — Ambulatory Visit (INDEPENDENT_AMBULATORY_CARE_PROVIDER_SITE_OTHER): Payer: Medicare Other

## 2021-11-20 DIAGNOSIS — Z7901 Long term (current) use of anticoagulants: Secondary | ICD-10-CM

## 2021-11-20 DIAGNOSIS — G459 Transient cerebral ischemic attack, unspecified: Secondary | ICD-10-CM | POA: Diagnosis not present

## 2021-11-20 DIAGNOSIS — Z954 Presence of other heart-valve replacement: Secondary | ICD-10-CM | POA: Diagnosis not present

## 2021-11-20 LAB — POCT INR: INR: 3 (ref 2.0–3.0)

## 2021-11-20 NOTE — Patient Instructions (Signed)
Description   ?Continue taking Warfarin 1 tablet daily.  ?Stay consistent with greens (3 times per week)  ?Recheck INR in 5 weeks at the [facility].  ?Coumadin Clinic [phone] ?  ?   ?

## 2022-01-01 ENCOUNTER — Ambulatory Visit (INDEPENDENT_AMBULATORY_CARE_PROVIDER_SITE_OTHER): Payer: Medicare Other

## 2022-01-01 DIAGNOSIS — Z954 Presence of other heart-valve replacement: Secondary | ICD-10-CM

## 2022-01-01 DIAGNOSIS — Z7901 Long term (current) use of anticoagulants: Secondary | ICD-10-CM | POA: Diagnosis not present

## 2022-01-01 DIAGNOSIS — G459 Transient cerebral ischemic attack, unspecified: Secondary | ICD-10-CM

## 2022-01-01 LAB — POCT INR: INR: 4.6 — AB (ref 2.0–3.0)

## 2022-01-01 NOTE — Patient Instructions (Signed)
Description   ?Hold today's dose and only take 0.5 tablet tomorrow and then continue taking Warfarin 1 tablet daily.  ?Stay consistent with greens (3 times per week)  ?Recheck INR in 3 weeks at the [facility].  ?Coumadin Clinic [phone] ?  ?   ?

## 2022-01-06 ENCOUNTER — Other Ambulatory Visit: Payer: Self-pay | Admitting: Cardiology

## 2022-01-29 ENCOUNTER — Ambulatory Visit (INDEPENDENT_AMBULATORY_CARE_PROVIDER_SITE_OTHER): Payer: Medicare Other

## 2022-01-29 DIAGNOSIS — Z954 Presence of other heart-valve replacement: Secondary | ICD-10-CM | POA: Diagnosis not present

## 2022-01-29 DIAGNOSIS — Z5181 Encounter for therapeutic drug level monitoring: Secondary | ICD-10-CM

## 2022-01-29 DIAGNOSIS — G459 Transient cerebral ischemic attack, unspecified: Secondary | ICD-10-CM

## 2022-01-29 DIAGNOSIS — Z7901 Long term (current) use of anticoagulants: Secondary | ICD-10-CM

## 2022-01-29 LAB — POCT INR: INR: 3.6 — AB (ref 2.0–3.0)

## 2022-01-29 NOTE — Patient Instructions (Signed)
Hold today's dose and then DECREASE to 1 tablet daily, except 0.5 tablet Wednesday. Stay consistent with greens (3 times per week)  Recheck INR in 4 weeks at the [facility].  Coumadin Clinic [phone]

## 2022-02-10 ENCOUNTER — Other Ambulatory Visit: Payer: Self-pay | Admitting: Cardiology

## 2022-02-11 NOTE — Telephone Encounter (Signed)
Rx refill sent to pharmacy. 

## 2022-02-26 ENCOUNTER — Other Ambulatory Visit: Payer: Self-pay | Admitting: Cardiology

## 2022-02-26 DIAGNOSIS — Z5181 Encounter for therapeutic drug level monitoring: Secondary | ICD-10-CM

## 2022-03-05 ENCOUNTER — Ambulatory Visit (INDEPENDENT_AMBULATORY_CARE_PROVIDER_SITE_OTHER): Payer: Medicare Other

## 2022-03-05 DIAGNOSIS — Z7901 Long term (current) use of anticoagulants: Secondary | ICD-10-CM

## 2022-03-05 DIAGNOSIS — Z954 Presence of other heart-valve replacement: Secondary | ICD-10-CM

## 2022-03-05 DIAGNOSIS — G459 Transient cerebral ischemic attack, unspecified: Secondary | ICD-10-CM

## 2022-03-05 LAB — POCT INR: INR: 2 (ref 2.0–3.0)

## 2022-03-22 ENCOUNTER — Other Ambulatory Visit: Payer: Self-pay | Admitting: Cardiology

## 2022-03-22 DIAGNOSIS — Z5181 Encounter for therapeutic drug level monitoring: Secondary | ICD-10-CM

## 2022-03-28 ENCOUNTER — Ambulatory Visit: Payer: Medicare Other | Admitting: Cardiology

## 2022-03-28 NOTE — Progress Notes (Deleted)
Cardiology Office Note:    Date:  03/28/2022   ID:  [name], DOB September 05, 1956, MRN [id number]  PCP:  [name], MD  Cardiologist:  Shirlee More, MD    Referring MD: [name], MD    ASSESSMENT:    No diagnosis found. PLAN:    In order of problems listed above:  ***   Next appointment: ***   Medication Adjustments/Labs and Tests Ordered: Current medicines are reviewed at length with the patient today.  Concerns regarding medicines are outlined above.  No orders of the defined types were placed in this encounter.  No orders of the defined types were placed in this encounter.   No chief complaint on file.   History of Present Illness:    [name] is a 66 y.o. 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  She was anemic with a hemoglobin of 7.3 and received 1 unit of packed cells in the hospital.   Compliance with diet, lifestyle and medications: ***  On her anticoagulant program her last INR 3 weeks ago 2.0. Component Ref Range & Units 3 wk ago (03/05/22) 1 mo ago (01/29/22) 2 mo ago (01/01/22) 4 mo ago (11/20/21) 5 mo ago (10/23/21) 5 mo ago (10/09/21) 5 mo ago (10/02/21)  INR 2.0 - 3.0 2.0  3.6 Abnormal   4.6 Abnormal   3.0  2.1  2.2  8.1 High Panic  R,    Past Medical History:  Diagnosis Date   Anemia 06/03/2019   Aortic stenosis 03/23/2013   Overview:  02/18/13 TTE EF >55%.  Critical AS with mean Ao valve gradient of 82 mm Hg. No AI. No MR, PR, mild TR. Estimated RVSP 30 mm Hg.   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 Postmenopausal bleeding since for the past 2 years.  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 Last biopsy was in May of 2019 which showed weakly proliferative endometrium possibly a polyp.     Sleep apnea    TIA (transient ischemic  attack) 06/04/2019   Transient neurologic deficit 06/03/2019    Past Surgical History:  Procedure Laterality Date   BUBBLE STUDY  06/07/2019   Procedure: BUBBLE STUDY;  Surgeon: Buford Dresser, MD;  Location: [facility];  Service: Cardiovascular;;   CHOLECYSTECTOMY  1983   RIGHT/LEFT HEART CATH AND CORONARY ANGIOGRAPHY N/A 04/01/2019   Procedure: RIGHT/LEFT HEART CATH AND CORONARY ANGIOGRAPHY;  Surgeon: Lorretta Harp, MD;  Location: [facility] CV LAB;  Service: Cardiovascular;  Laterality: N/A;   TEE WITHOUT  CARDIOVERSION N/A 07/01/2018   Procedure: TRANSESOPHAGEAL ECHOCARDIOGRAM (TEE);  Surgeon: Buford Dresser, MD;  Location: [facility] ENDOSCOPY;  Service: Cardiovascular;  Laterality: N/A;   TEE WITHOUT CARDIOVERSION N/A 06/07/2019   Procedure: TRANSESOPHAGEAL ECHOCARDIOGRAM (TEE);  Surgeon: Buford Dresser, MD;  Location[facility] ENDOSCOPY;  Service: Cardiovascular;  Laterality: N/A;   TISSUE AORTIC VALVE REPLACEMENT      Current Medications: No outpatient medications have been marked as taking for the 03/28/22 encounter (Appointment) with Richardo Priest, MD.     Allergies:   Vancomycin   Social History   Socioeconomic History   Marital status: Married    Spouse name: Not on file   Number of children: Not on file   Years of education: Not on file   Highest education level: Not on file  Occupational History   Not on file  Tobacco Use   Smoking status: Never   Smokeless tobacco: Never  Vaping Use   Vaping Use: Never used  Substance and Sexual Activity   Alcohol use: No   Drug use: No   Sexual activity: Not Currently  Other Topics Concern   Not on file  Social History Narrative   Not on file   Social Determinants of Health   Financial Resource Strain: Not on file  Food Insecurity: Not on file  Transportation Needs: Not on file  Physical Activity: Not on file  Stress: Not on file  Social Connections: Not on file     Family History: The patient's ***family history includes Heart attack in her mother; Parkinson's disease in her father. ROS:   Please see the history of present illness.    All other systems reviewed and are negative.  EKGs/Labs/Other Studies Reviewed:    The following studies were reviewed today:  EKG:  EKG ordered today and personally reviewed.  The ekg ordered today demonstrates ***  Recent Labs: 03/31/2021: Magnesium 2.1 08/07/2021: ALT 17; BUN 21; Creatinine 1.2; Hemoglobin 15.7; Platelets 254; Potassium 4.4; Sodium 139  Recent Lipid Panel     Component Value Date/Time   CHOL 176 07/16/2021 1433   TRIG 160 (H) 07/16/2021 1433   HDL 42 07/16/2021 1433   CHOLHDL 4.2 07/16/2021 1433   CHOLHDL 4.1 06/04/2019 0507   VLDL 22 06/04/2019 0507   LDLCALC 106 (H) 07/16/2021 1433    Physical Exam:    VS:  There were no vitals taken for this visit.    Wt Readings from Last 3 Encounters:  08/07/21 217 lb 9.6 oz (98.7 kg)  07/16/21 232 lb (105.2 kg)  06/17/21 227 lb (103 kg)     GEN: *** Well nourished, well developed in no acute distress HEENT: Normal NECK: No JVD; No carotid bruits LYMPHATICS: No lymphadenopathy CARDIAC: ***RRR, no murmurs, rubs, gallops RESPIRATORY:  Clear to auscultation without rales, wheezing or rhonchi  ABDOMEN: Soft, non-tender, non-distended MUSCULOSKELETAL:  No edema; No deformity  SKIN: Warm and dry NEUROLOGIC:  Alert and oriented x 3 PSYCHIATRIC:  Normal affect  Signed, Shirlee More, MD  03/28/2022 10:44 AM    [place]

## 2022-04-09 ENCOUNTER — Ambulatory Visit (INDEPENDENT_AMBULATORY_CARE_PROVIDER_SITE_OTHER): Payer: Medicare Other

## 2022-04-09 DIAGNOSIS — Z954 Presence of other heart-valve replacement: Secondary | ICD-10-CM

## 2022-04-09 DIAGNOSIS — G459 Transient cerebral ischemic attack, unspecified: Secondary | ICD-10-CM | POA: Diagnosis not present

## 2022-04-09 DIAGNOSIS — Z7901 Long term (current) use of anticoagulants: Secondary | ICD-10-CM

## 2022-04-09 LAB — POCT INR: INR: 1.9 — AB (ref 2.0–3.0)

## 2022-04-09 NOTE — Patient Instructions (Signed)
Description   Take 1.5 tablets today and then continue taking 1 tablet daily, except 0.5 tablet Wednesday. Stay consistent with greens (3 times per week)  Recheck INR in 5 weeks at the [facility].  Coumadin Clinic [phone]

## 2022-05-03 ENCOUNTER — Telehealth: Payer: Self-pay

## 2022-05-03 NOTE — Telephone Encounter (Signed)
   Pre-operative Risk Assessment    Patient Name: [name]  DOB: 05/07/1956 MRN: [id number]      Request for Surgical Clearance    Procedure:   Colonoscopy  Date of Surgery:  Clearance 06/18/22                                 Surgeon:  Thornton Park, MD Surgeon's Group or Practice Name: [facility]  Phone number:  Not listed Fax number:  [phone]   Type of Clearance Requested:   - Pharmacy:  Hold Warfarin (Coumadin) Coumadin   Type of Anesthesia:  Not Indicated   Additional requests/questions:  Please fax a copy of form to the surgeon's office.  Daylene Katayama   05/03/2022, 4:01 PM

## 2022-05-06 ENCOUNTER — Other Ambulatory Visit: Payer: Self-pay | Admitting: Cardiology

## 2022-05-06 NOTE — Telephone Encounter (Signed)
Rx refill sent to pharmacy. 

## 2022-05-08 NOTE — Telephone Encounter (Signed)
Will route to PharmD for rec's re: holding anticoagulation. Richardson Dopp, PA-C    05/08/2022 8:29 AM

## 2022-05-08 NOTE — Telephone Encounter (Signed)
Patient with diagnosis of bioprosthetic AVR on warfarin for anticoagulation.    Procedure: colonoscopy Date of procedure: 06/18/22  CHA2DS2-VASc Score = 8  This indicates a 10.8% annual risk of stroke. The patient's score is based upon: CHF History: 1 HTN History: 1 Diabetes History: 1 Stroke History: 2 Vascular Disease History: 1 Age Score: 1 Gender Score: 1   CrCl 59 mL/min Platelet count 245K  Would suggest asking if surgeon would be willing to perform colonoscopy without warfarin hold due to patient's elevated risk, including AVR plus history of stroke.  If not, will loop in Dr Bettina Gavia for input.  **This guidance is not considered finalized until pre-operative APP has relayed final recommendations.**

## 2022-05-14 ENCOUNTER — Ambulatory Visit: Payer: Medicare Other

## 2022-05-14 NOTE — Telephone Encounter (Signed)
I called Dr. Crisoforo Oxford office and spoke with Santiago Glad to see if diagnostic colonoscopy may be done without holding anticoagulation.  She will review with Dr. Lyda Jester and call back to let us know.  Emmaline Life, NP-C    05/14/2022, 10:54 AM [address] Office [phone] Fax [phone]

## 2022-05-15 NOTE — Telephone Encounter (Signed)
Caller stated that Dr. Lyda Jester does not want to do the colonoscopy without holding the anticoagulation.  Caller wanted to know if the cardiologist would recommend a lovenox bridge or not doing the procedure at all from a cardiology standpoint even with the bridge.

## 2022-05-20 ENCOUNTER — Encounter (HOSPITAL_COMMUNITY): Payer: Self-pay | Admitting: Emergency Medicine

## 2022-05-20 ENCOUNTER — Emergency Department (HOSPITAL_COMMUNITY): Payer: No Typology Code available for payment source

## 2022-05-20 ENCOUNTER — Emergency Department (HOSPITAL_COMMUNITY)
Admission: EM | Admit: 2022-05-20 | Discharge: 2022-05-20 | Disposition: A | Discharge status: Home / Self Care | Payer: No Typology Code available for payment source | Attending: Emergency Medicine | Admitting: Emergency Medicine

## 2022-05-20 ENCOUNTER — Other Ambulatory Visit: Payer: Self-pay

## 2022-05-20 DIAGNOSIS — Z7902 Long term (current) use of antithrombotics/antiplatelets: Secondary | ICD-10-CM | POA: Diagnosis not present

## 2022-05-20 DIAGNOSIS — I110 Hypertensive heart disease with heart failure: Secondary | ICD-10-CM | POA: Insufficient documentation

## 2022-05-20 DIAGNOSIS — S62617A Displaced fracture of proximal phalanx of left little finger, initial encounter for closed fracture: Secondary | ICD-10-CM | POA: Diagnosis not present

## 2022-05-20 DIAGNOSIS — Z79899 Other long term (current) drug therapy: Secondary | ICD-10-CM | POA: Diagnosis not present

## 2022-05-20 DIAGNOSIS — S6992XA Unspecified injury of left wrist, hand and finger(s), initial encounter: Secondary | ICD-10-CM | POA: Diagnosis present

## 2022-05-20 DIAGNOSIS — Y92410 Unspecified street and highway as the place of occurrence of the external cause: Secondary | ICD-10-CM | POA: Diagnosis not present

## 2022-05-20 DIAGNOSIS — S62619A Displaced fracture of proximal phalanx of unspecified finger, initial encounter for closed fracture: Secondary | ICD-10-CM

## 2022-05-20 DIAGNOSIS — I5032 Chronic diastolic (congestive) heart failure: Secondary | ICD-10-CM | POA: Diagnosis not present

## 2022-05-20 MED ORDER — HYDROCODONE-ACETAMINOPHEN 5-325 MG PO TABS
1.0000 | ORAL_TABLET | Freq: Once | ORAL | Status: AC
  Administered 2022-05-20: 1 via ORAL
  Filled 2022-05-20: qty 1
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
## 2022-05-20 NOTE — Discharge Instructions (Signed)
Return if any problems.  Schedule to see the Orthopaedist for evaluation °
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
## 2022-05-21 ENCOUNTER — Ambulatory Visit: Payer: Medicare Other | Attending: Cardiology

## 2022-05-21 ENCOUNTER — Telehealth: Payer: Self-pay | Admitting: Cardiology

## 2022-05-21 DIAGNOSIS — G459 Transient cerebral ischemic attack, unspecified: Secondary | ICD-10-CM | POA: Insufficient documentation

## 2022-05-21 DIAGNOSIS — Z7901 Long term (current) use of anticoagulants: Secondary | ICD-10-CM | POA: Diagnosis not present

## 2022-05-21 DIAGNOSIS — Z954 Presence of other heart-valve replacement: Secondary | ICD-10-CM | POA: Diagnosis not present

## 2022-05-21 LAB — POCT INR: INR: 1.9 — AB (ref 2.0–3.0)

## 2022-05-21 MED ORDER — ENOXAPARIN SODIUM 100 MG/ML IJ SOSY: 100.0000 mg | PREFILLED_SYRINGE | Freq: Two times a day (BID) | INTRAMUSCULAR | 1 refills | Status: DC

## 2022-05-21 NOTE — Patient Instructions (Addendum)
Description   Take 1.5 tablets today and then continue taking 1 tablet daily, except 0.5 tablet Wednesday. Starting tomorrow, 9/13 follow pre-procedure instructions.  Stay consistent with greens (3 times per week)  Recheck INR in 1 week post procedure at the [facility].  Coumadin Clinic [phone]      9/12: Last dose of warfarin.  9/13: No warfarin or enoxaparin (Lovenox).  9/14: Inject enoxaparin '100mg'$  in the fatty abdominal tissue at least 2 inches from the belly button twice a day about 12 hours apart, 8am and 8pm rotate sites. No warfarin.  9/15: Inject enoxaparin in the fatty tissue every 12 hours, 8am and 8pm. No warfarin.  9/16: Inject enoxaparin in the fatty tissue every 12 hours, 8am and 8pm. No warfarin.  9/17: Inject enoxaparin in the fatty tissue in the morning at 8 am (No PM dose). No warfarin.  9/18: Procedure Day - No enoxaparin - Resume warfarin in the evening or as directed by doctor (take an extra half tablet with usual dose for 2 days then resume normal dose).  9/19: Resume enoxaparin inject in the fatty tissue every 12 hours and take warfarin  9/20: Inject enoxaparin in the fatty tissue every 12 hours and take warfarin  9/21: Inject enoxaparin in the fatty tissue every 12 hours and take warfarin  9/22: Inject enoxaparin in the fatty tissue every 12 hours and take warfarin  9/23: Inject enoxaparin in the fatty tissue every 12 hours and take warfarin  9/24: Inject enoxaparin in the fatty tissue every 12 hours and take warfarin  9/25: Inject enoxaparin in the fatty tissue every 12 hours and take warfarin  9/26: Inject enoxaparin and warfarin appt to check INR.
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
## 2022-05-21 NOTE — Telephone Encounter (Signed)
Patient with diagnosis of AVR on warfarin for anticoagulation.    Procedure: ORIS of the left small finger Date of procedure: 05/27/22   CHA2DS2-VASc Score = 8  This indicates a 10.8% annual risk of stroke. The patient's score is based upon: CHF History: 1 HTN History: 1 Diabetes History: 1 Stroke History: 2 Vascular Disease History: 1 Age Score: 1 Gender Score: 1   CrCl 59 mL/min using adjusted body weight Platelet count 245K  Patient does require pre-op antibiotics for dental procedure.  Per office protocol, patient can hold warfarin for 5 days  prior to procedure.    Patient WILL need bridging with Lovenox (enoxaparin) around procedure.  Will forward to Coumadin clinic  **This guidance is not considered finalized until pre-operative APP has relayed final recommendations.**

## 2022-05-21 NOTE — Telephone Encounter (Signed)
   Pre-operative Risk Assessment    Patient Name: [name]  DOB: 15-Apr-1956 MRN: [id number]      Request for Surgical Clearance    Procedure:   ORIS of the left small finger  Date of Surgery:  Clearance 05/27/22                                 Surgeon:  Dr. Donivan Scull Surgeon's Group or Practice Name:  [facility] Phone number:  [phone] Fax number:  [phone]   Type of Clearance Requested:   - Medical  - Pharmacy:  Hold Warfarin (Coumadin) TBD by Cardiology   Type of Anesthesia:  General    Additional requests/questions:   Does patient need a bridge  Crist Infante   05/21/2022, 1:08 PM

## 2022-05-22 ENCOUNTER — Encounter: Payer: Self-pay | Admitting: Cardiology

## 2022-05-22 ENCOUNTER — Ambulatory Visit: Payer: Medicare Other | Attending: Cardiology | Admitting: Cardiology

## 2022-05-22 VITALS — BP 158/82 | HR 101 | Ht 61.0 in | Wt 243.2 lb

## 2022-05-22 DIAGNOSIS — Z7901 Long term (current) use of anticoagulants: Secondary | ICD-10-CM | POA: Diagnosis present

## 2022-05-22 DIAGNOSIS — Z954 Presence of other heart-valve replacement: Secondary | ICD-10-CM | POA: Diagnosis present

## 2022-05-22 DIAGNOSIS — I110 Hypertensive heart disease with heart failure: Secondary | ICD-10-CM | POA: Insufficient documentation

## 2022-05-22 DIAGNOSIS — Z01810 Encounter for preprocedural cardiovascular examination: Secondary | ICD-10-CM | POA: Insufficient documentation

## 2022-05-22 DIAGNOSIS — D509 Iron deficiency anemia, unspecified: Secondary | ICD-10-CM | POA: Insufficient documentation

## 2022-05-22 MED ORDER — TORSEMIDE 20 MG PO TABS: 20.0000 mg | ORAL_TABLET | Freq: Two times a day (BID) | ORAL | 3 refills | Status: DC

## 2022-05-22 MED ORDER — TORSEMIDE 20 MG PO TABS: 20.0000 mg | ORAL_TABLET | Freq: Every day | ORAL | 3 refills | Status: DC

## 2022-05-22 NOTE — Patient Instructions (Addendum)
Medication Instructions:  Your physician has recommended you make the following change in your medication:   START: Torsemide 20 mg daily (take Torsemide 20 mg twice daily for weight > 210 lbs.)  *If you need a refill on your cardiac medications before your next appointment, please call your pharmacy*   Lab Work: None If you have labs (blood work) drawn today and your tests are completely normal, you will receive your results only by: [place] (if you have MyChart) OR A paper copy in the mail If you have any lab test that is abnormal or we need to change your treatment, we will call you to review the results.   Testing/Procedures: Your physician has requested that you have an echocardiogram. Echocardiography is a painless test that uses sound waves to create images of your heart. It provides your doctor with information about the size and shape of your heart and how well your heart's chambers and valves are working. This procedure takes approximately one hour. There are no restrictions for this procedure.    Follow-Up: At [facility], you and your health needs are our priority.  As part of our continuing mission to provide you with exceptional heart care, we have created designated Provider Care Teams.  These Care Teams include your primary Cardiologist (physician) and Advanced Practice Providers (APPs -  Physician Assistants and Nurse Practitioners) who all work together to provide you with the care you need, when you need it.  We recommend signing up for the patient portal called "MyChart".  Sign up information is provided on this After Visit Summary.  MyChart is used to connect with patients for Virtual Visits (Telemedicine).  Patients are able to view lab/test results, encounter notes, upcoming appointments, etc.  Non-urgent messages can be sent to your provider as well.   To learn more about what you can do with MyChart, go to [email or web address].    Your next  appointment:   9 month(s)  The format for your next appointment:   In Person  Provider:   Shirlee More, MD    Other Instructions None  Important Information About Sugar

## 2022-05-22 NOTE — Telephone Encounter (Signed)
   Name: [name]  DOB: 1956/04/11  MRN: [id number]  Primary Cardiologist: Shirlee More, MD  Chart reviewed as part of pre-operative protocol coverage. The patient has an upcoming visit scheduled with Dr. Bettina Gavia on 05/22/2022 at which time clearance can be addressed in case there are any issues that would impact surgical recommendations.  ORIS of left small finger Is not scheduled until 05/27/2022 as below. I added preop FYI to appointment note so that provider is aware to address at time of outpatient visit.  Per office protocol the cardiology provider should forward their finalized clearance decision and recommendations regarding antiplatelet therapy to the requesting party below.    Patient with diagnosis of AVR on warfarin for anticoagulation.     Procedure: ORIS of the left small finger Date of procedure: 05/27/22   CHA2DS2-VASc Score = 8  This indicates a 10.8% annual risk of stroke. The patient's score is based upon: CHF History: 1 HTN History: 1 Diabetes History: 1 Stroke History: 2 Vascular Disease History: 1 Age Score: 1 Gender Score: 1     CrCl 59 mL/min using adjusted body weight Platelet count 245K   Patient does require pre-op antibiotics for dental procedure.   Per office protocol, patient can hold warfarin for 5 days  prior to procedure.     Patient WILL need bridging with Lovenox (enoxaparin) around procedure (has been forwarded to Coumadin clinic).   I will route this message as FYI to requesting party and remove this message from the preop box as separate preop APP input not needed at this time.   Please call with any questions.  Lenna Sciara, NP  05/22/2022, 12:10 PM

## 2022-05-23 NOTE — Telephone Encounter (Signed)
   Patient Name: [name]  DOB: 1956-08-27 MRN: [id number]  Primary Cardiologist: Shirlee More, MD  Chart reviewed as part of pre-operative protocol coverage.  Dr. Bettina Gavia saw the patient yesterday in clinic for pre-procedural clearance and recommended bridging anticoagulation while off Coumadin for colonoscopy. I will route a copy of his note to requesting provider as well as to pharmD team to let them know to review his note.  Will route this bundled recommendation to requesting provider via Epic fax function. Please call with questions.   Charlie Pitter, PA-C 05/23/2022, 7:50 AM

## 2022-05-24 NOTE — Telephone Encounter (Signed)
Spoke with Dr. Jimmye Norman from requesting office and made him aware of Dr. Joya Gaskins recommendations that it is okay for patient to proceed with surgery.

## 2022-05-24 NOTE — Telephone Encounter (Signed)
   Primary Cardiologist: Shirlee More, MD  Chart reviewed as part of pre-operative protocol coverage. Given past medical history and time since last visit, based on ACC/AHA guidelines, JALAILA CARADONNA would be at acceptable risk for the planned procedure without further cardiovascular testing.   Patient will need bridging with Lovenox for procedure and has appointment with [facility] for 06/04/22 for further instructions.   I will route this recommendation to the requesting party via Epic fax function and remove from pre-op pool.  Please call with questions.  Emmaline Life, NP-C     05/24/2022, 8:30 AM [address], [address] Office [phone] Fax [phone]

## 2022-05-24 NOTE — Telephone Encounter (Signed)
Per Dr. Joya Gaskins notes from 9/13, she may proceed with planned hand surgery and colonoscopy. Increase in weight was notied and diuretic was increased for peripheral edema. He ordered an echocardiogram but did not state that it must be completed prior to surgery.  Emmaline Life, NP-C  05/24/2022, 2:19 PM [address] Office [phone] Fax [phone]

## 2022-05-24 NOTE — Telephone Encounter (Signed)
  Dr. Jimmye Norman anesthesiologist calling back to f/u clearance. They received that pt needs bridging with lovenox but its not clear if pt is cleared for the procedure. Also, he said, pt has weeping lymphedema and weight gained, their last weight of the pt is 235 lbs and today pt is 248 lbs. He would like to confirm if pt is cleared for procedure

## 2022-05-24 NOTE — Telephone Encounter (Signed)
Will hold 5 days and bridge with Lovenox.  Note routed to [facility] Coumadin clinic

## 2022-05-24 NOTE — Telephone Encounter (Signed)
Will forward to preop provider to see notes from anesthesiologist

## 2022-05-28 ENCOUNTER — Telehealth: Payer: Self-pay | Admitting: Cardiology

## 2022-05-28 NOTE — Telephone Encounter (Signed)
  [name] clinic is calling back. She would like to get a copy for pt's plan of care for bridging Lovenox. She said, since they are not on epic it will be helpful for them if they have a copy of pt's plan when is she going to get Lovenox bridging and when pt will be stopping blood thinner. She gave fax# [phone]

## 2022-05-29 NOTE — Telephone Encounter (Signed)
Called digestive clinic and left voicemail. Pt has appt at Coumadin Clinic on Tuesday, 06/04/22 and will received Warfarin/Lovenox bridging instructions. I can fax these instructions to clinic as well after appt on 06/04/22. Will wait for call back.

## 2022-05-30 NOTE — Telephone Encounter (Signed)
Called and spoke with Santiago Glad at [facility] clinic. Confirmed their correct fax number and made her aware I can fax her the Lovenox instructions to the clinic on Tuesday, 06/04/22.   Fax# [phone]

## 2022-06-04 ENCOUNTER — Ambulatory Visit: Payer: Medicare Other | Attending: Cardiology

## 2022-06-04 DIAGNOSIS — Z954 Presence of other heart-valve replacement: Secondary | ICD-10-CM

## 2022-06-04 DIAGNOSIS — G459 Transient cerebral ischemic attack, unspecified: Secondary | ICD-10-CM

## 2022-06-04 DIAGNOSIS — Z7901 Long term (current) use of anticoagulants: Secondary | ICD-10-CM

## 2022-06-04 LAB — POCT INR: INR: 1.1 — AB (ref 2.0–3.0)

## 2022-06-04 NOTE — Patient Instructions (Addendum)
  Description   Continue Lovenox injections. Take 2 tablets today and 2 tablets tomorrow and then continue taking 1 tablet daily, except 0.5 tablet Wednesday.  Stay consistent with greens (3 times per week)  Recheck INR at [facility] on 06/13/22 at [employer].  Coumadin Clinic [phone]

## 2022-06-05 ENCOUNTER — Telehealth: Payer: Self-pay

## 2022-06-05 NOTE — Telephone Encounter (Signed)
Called [facility] and spoke with Jarrett Soho, RN. Made her aware that I saw pt in anticoagulation clinic yesterday to discuss Lovenox bridging; however, pt stated she was NOT going to have the colonoscopy at this time and was going to call digestive clinic to cancel the procedure. Pt stated she had too much going on at this time and needed to recover from her recent surgery prior to having the colonoscopy.   Jarrett Soho stated pt has not called her clinic to let them know but she will reach out and contact anticoagulation clinic if colonoscopy is rescheduled.

## 2022-06-11 ENCOUNTER — Telehealth: Payer: Self-pay

## 2022-06-11 LAB — PROTIME-INR: INR: 1 (ref <=1.20)

## 2022-06-11 NOTE — Telephone Encounter (Signed)
Received message from [place], RN stating pt was calling [facility] office and requesting ti speak with Coumadin Clinic in [place].   Returned pt's call; however, no answer and voicemail was full.   Call[place] and [place]. Pt has had nausea/vomiting and [place]; however, pt's husband stated pt is now in the emergency room. Pt has standing order for PT/INR  to be drawn tomorrow at [facility] lab. If pt is in ER, INR should be checked. Will follow-up in a.m. to determine what INR was and if pt's symptoms have improved.

## 2022-06-12 ENCOUNTER — Ambulatory Visit (INDEPENDENT_AMBULATORY_CARE_PROVIDER_SITE_OTHER): Payer: Medicare Other

## 2022-06-12 DIAGNOSIS — Z954 Presence of other heart-valve replacement: Secondary | ICD-10-CM | POA: Diagnosis not present

## 2022-06-12 DIAGNOSIS — Z7901 Long term (current) use of anticoagulants: Secondary | ICD-10-CM | POA: Diagnosis not present

## 2022-06-12 DIAGNOSIS — G459 Transient cerebral ischemic attack, unspecified: Secondary | ICD-10-CM

## 2022-06-12 NOTE — Patient Instructions (Addendum)
Description   Called and spoke with pt's husband. Instructed continue Lovenox injections and take 2 tablets today and 2 tablets tomorrow and then continue taking 1 tablet daily, except 0.5 tablet Wednesday.  Stay consistent with greens (3 times per week)  Recheck INR in 1 week Coumadin Clinic [phone]

## 2022-06-14 ENCOUNTER — Other Ambulatory Visit: Payer: Self-pay

## 2022-06-14 DIAGNOSIS — G459 Transient cerebral ischemic attack, unspecified: Secondary | ICD-10-CM

## 2022-06-14 DIAGNOSIS — Z7901 Long term (current) use of anticoagulants: Secondary | ICD-10-CM

## 2022-06-14 DIAGNOSIS — Z954 Presence of other heart-valve replacement: Secondary | ICD-10-CM

## 2022-06-14 MED ORDER — ENOXAPARIN SODIUM 100 MG/ML IJ SOSY: 100.0000 mg | PREFILLED_SYRINGE | Freq: Two times a day (BID) | INTRAMUSCULAR | 1 refills | Status: DC

## 2022-06-14 NOTE — Telephone Encounter (Signed)
Pt returned call. Instructed pt to continue Lovenox injections and Warfarin and come to anticoagulation clinic on Tuesday to have INR checked. Pt verbalized understanding.

## 2022-06-14 NOTE — Telephone Encounter (Signed)
Received voicemail from pt stating she is out of Lovenox injections. Last INR was 1.0. Per Karren Cobble, PharmD, pt should continue Lovenox injections until INR is therapeutic.   Called pt to provide Lovenox instructions, no answer. LMOM.

## 2022-06-18 ENCOUNTER — Ambulatory Visit: Payer: No Typology Code available for payment source

## 2022-06-18 ENCOUNTER — Telehealth: Payer: Self-pay

## 2022-06-18 DIAGNOSIS — Z7901 Long term (current) use of anticoagulants: Secondary | ICD-10-CM

## 2022-06-18 NOTE — Telephone Encounter (Signed)
Pt missed anticoagulation clinic appt. Made pt aware of the importance of checking INR since the last 2 results have been critically subtherapeitic. Pt stated she forgot about appt this morning; however, she can come to [facility] lab tomorrow. Lab order placed for PT/INR to be drawn at [facility] clinic tomorrow, 06/19/22. Confirmed pt is still taking Lovenox injections. Made pt aware that when INR lab has resulted, the Anticoagulation clinic will call with results and Warfarin dosing instructions. Pt verbalized understanding apologized for missing her appt.

## 2022-06-19 ENCOUNTER — Ambulatory Visit (INDEPENDENT_AMBULATORY_CARE_PROVIDER_SITE_OTHER): Payer: Medicare Other | Admitting: *Deleted

## 2022-06-19 DIAGNOSIS — Z7901 Long term (current) use of anticoagulants: Secondary | ICD-10-CM | POA: Diagnosis not present

## 2022-06-19 DIAGNOSIS — Z954 Presence of other heart-valve replacement: Secondary | ICD-10-CM

## 2022-06-19 DIAGNOSIS — G459 Transient cerebral ischemic attack, unspecified: Secondary | ICD-10-CM | POA: Diagnosis not present

## 2022-06-19 LAB — PROTIME-INR
INR: 1.2 (ref 0.9–1.2)
Prothrombin Time: 12.1 s — ABNORMAL HIGH (ref 9.1–12.0)

## 2022-06-19 NOTE — Patient Instructions (Signed)
Description   Called and spoke with pt and instructed continue Lovenox injections and take 2 tablets today and 2 tablets tomorrow and then start taking 1 tablet daily, except 2 tablets on Sunday. Stay consistent with greens (3 times per week)  Recheck INR on Tuesday. Coumadin Clinic [phone]

## 2022-06-20 ENCOUNTER — Telehealth: Payer: Self-pay

## 2022-06-20 DIAGNOSIS — Z7901 Long term (current) use of anticoagulants: Secondary | ICD-10-CM

## 2022-06-20 NOTE — Telephone Encounter (Signed)
Called pt to schedule anticoagulation appt in [place] on Tuesday. Pt stated she would be out of town until Tuesday night. Lab order placed to have PT/INR drawn on Wednesday  at [place] lab. Pt verbalized understanding.

## 2022-06-26 LAB — PROTIME-INR
INR: 1.6 — ABNORMAL HIGH (ref 0.9–1.2)
Prothrombin Time: 17 s — ABNORMAL HIGH (ref 9.1–12.0)

## 2022-06-27 ENCOUNTER — Ambulatory Visit (INDEPENDENT_AMBULATORY_CARE_PROVIDER_SITE_OTHER): Payer: Medicare Other | Admitting: Cardiology

## 2022-06-27 DIAGNOSIS — G459 Transient cerebral ischemic attack, unspecified: Secondary | ICD-10-CM

## 2022-06-27 DIAGNOSIS — Z954 Presence of other heart-valve replacement: Secondary | ICD-10-CM | POA: Diagnosis not present

## 2022-06-27 DIAGNOSIS — Z5181 Encounter for therapeutic drug level monitoring: Secondary | ICD-10-CM | POA: Diagnosis not present

## 2022-06-27 DIAGNOSIS — Z7901 Long term (current) use of anticoagulants: Secondary | ICD-10-CM | POA: Diagnosis not present

## 2022-06-27 NOTE — Patient Instructions (Signed)
Description   Called and spoke with pt's husband. Instructed for pt to continue Lovenox injections and take 3 tablets today. Then, START taking 1 tablet daily, except 2 tablets on Sundays and Fridays. Stay consistent with greens (3 times per week)  Recheck INR on Tuesday. Coumadin Clinic [phone]

## 2022-06-28 ENCOUNTER — Ambulatory Visit (INDEPENDENT_AMBULATORY_CARE_PROVIDER_SITE_OTHER): Payer: Medicare Other | Admitting: Podiatry

## 2022-06-28 DIAGNOSIS — M79675 Pain in left toe(s): Secondary | ICD-10-CM | POA: Diagnosis not present

## 2022-06-28 DIAGNOSIS — M2042 Other hammer toe(s) (acquired), left foot: Secondary | ICD-10-CM | POA: Diagnosis not present

## 2022-06-28 DIAGNOSIS — M79674 Pain in right toe(s): Secondary | ICD-10-CM | POA: Diagnosis not present

## 2022-06-28 DIAGNOSIS — L84 Corns and callosities: Secondary | ICD-10-CM

## 2022-06-28 DIAGNOSIS — B351 Tinea unguium: Secondary | ICD-10-CM

## 2022-06-28 DIAGNOSIS — I739 Peripheral vascular disease, unspecified: Secondary | ICD-10-CM

## 2022-06-28 DIAGNOSIS — I890 Lymphedema, not elsewhere classified: Secondary | ICD-10-CM

## 2022-06-28 NOTE — Progress Notes (Signed)
  Subjective:  Patient ID: [name], female    DOB: February 25, 1956,  MRN: [id number]  Chief Complaint  Patient presents with   Nail Problem    RFC    66 y.o. female presents with the above complaint. History confirmed with patient. Patient presenting with pain related to dystrophic thickened elongated nails. Patient is unable to trim own nails related to nail dystrophy and/or mobility issues. Patient does not have a history of T2DM  but does have history PVD/ lymphedema and is on warfarin chronically. Patient does/does not have callus present located at the distal aspect of the left 3rd toe causing pain.   Objective:  Physical Exam: warm, good capillary refill, DP and PT pulses 24 bilateral nail exam onychomycosis of the toenails, onycholysis, and dystrophic nails DP pulses palpable, PT pulses palpable, and protective sensation intact Left Foot:  Pain with palpation of nails due to elongation and dystrophic growth. Hyperkeratotic lesion present present to distal tuft of left 3rd toe, hammertoe deformity present. Also with open wound on left leg with eschar related to chronic lymphedema left leg.  Right Foot: Pain with palpation of nails due to elongation and dystrophic growth.   Assessment:   1. Pain due to onychomycosis of toenails of both feet   2. Pre-ulcerative calluses   3. Hammertoe of left foot   4. PVD (peripheral vascular disease) ([place])   5. Lymphedema      Plan:  Patient was evaluated and treated and all questions answered.  #Hyperkeratotic lesions/pre ulcerative calluses present distal aspect left 3rd toe All symptomatic hyperkeratoses x 1 separate lesions were safely debrided with a sterile #10 blade to patient's level of comfort without incident. We discussed preventative and palliative care of these lesions including supportive and accommodative shoegear, padding, prefabricated and custom molded accommodative orthoses, use of a pumice stone and lotions/creams  daily.  #Onychomycosis with pain  -Nails palliatively debrided as below. -Educated on self-care  Procedure: Nail Debridement Rationale: Pain Type of Debridement: manual, sharp debridement. Instrumentation: Nail nipper, rotary burr. Number of Nails: 10  Return in about 3 months (around 09/28/2022).         Everitt Amber, DPM [facility]

## 2022-07-01 ENCOUNTER — Other Ambulatory Visit: Payer: Self-pay | Admitting: Cardiology

## 2022-07-01 DIAGNOSIS — Z5181 Encounter for therapeutic drug level monitoring: Secondary | ICD-10-CM

## 2022-07-01 NOTE — Telephone Encounter (Signed)
Prescription refill request received for warfarin Lov: 05/22/22 [facility])  Next INR check: 07/02/22 Warfarin tablet strength: '1mg'$   Appropriate dose and refill sent to requested pharmacy.

## 2022-07-02 ENCOUNTER — Telehealth: Payer: Self-pay

## 2022-07-02 DIAGNOSIS — Z7901 Long term (current) use of anticoagulants: Secondary | ICD-10-CM

## 2022-07-02 NOTE — Telephone Encounter (Signed)
Pt due to check INR today; however, she canceled her appt because she is out of town until [employer]. Placed PT/INR lab order and instructed pt to come to [facility] lab in the morning. Educated pt on the importance of coming to have INR checked since she has not been therapeutic and also confirmed correct Warfarin dose that she was instructed to take last week. Pt stated she will come to lab tomorrow. Will follow-up with pt when labs are resulted.

## 2022-07-03 ENCOUNTER — Telehealth: Payer: Self-pay

## 2022-07-03 NOTE — Telephone Encounter (Signed)
Pt was due to have INR drawn at [facility] lab today. No results received. Called pt to determine if she was able to go today, no answer - left voicemail.   Called and spoke with husband, [name] who stated he has been telling her to go to the lab; however "she won't listen". [name] also stated he was very concerned about her health, not only her INR results and has asked her to go to PCP but she continues to ignore him. I made Dan aware that pt needs PT/INR drawn as soon as possible and can go to [employer]. He verbalized understanding and appreciated Anticoagulation clinic reaching out out to follow-up.

## 2022-07-05 ENCOUNTER — Ambulatory Visit (INDEPENDENT_AMBULATORY_CARE_PROVIDER_SITE_OTHER): Payer: Medicare Other | Admitting: Cardiology

## 2022-07-05 DIAGNOSIS — Z5181 Encounter for therapeutic drug level monitoring: Secondary | ICD-10-CM

## 2022-07-05 LAB — PROTIME-INR
INR: 1.6 — ABNORMAL HIGH (ref 0.9–1.2)
Prothrombin Time: 17 s — ABNORMAL HIGH (ref 9.1–12.0)

## 2022-07-05 NOTE — Patient Instructions (Signed)
Description   Pt STARTED taking doxy on 07/04/2022  Called and spoke with pt and instructed for pt to continue Lovenox injections and take 3 tablets of warfarin today. Then, continue taking 1 tablet daily, except 2 tablets on Sundays and Fridays. Stay consistent with greens (3 times per week)  Recheck INR on Tuesday. Coumadin Clinic [phone]

## 2022-07-09 ENCOUNTER — Telehealth: Payer: Self-pay

## 2022-07-09 ENCOUNTER — Ambulatory Visit: Payer: No Typology Code available for payment source

## 2022-07-09 DIAGNOSIS — Z7901 Long term (current) use of anticoagulants: Secondary | ICD-10-CM

## 2022-07-09 DIAGNOSIS — Z5181 Encounter for therapeutic drug level monitoring: Secondary | ICD-10-CM

## 2022-07-09 NOTE — Telephone Encounter (Signed)
Called and spoke with pt concerning missed anticoagulation appt today. Pt stated she forgot. I offered afternoon appts and I could work her in; however, pt stated she couldn't come.   PT/INR lab ordered and I instructed pt to come to [place] lab in the morning to have labs drawn.  Stressed the importance of having this lab completed since her INR has not been therapeutic and she is on antibiotics. Also made pt aware that after this lab, I will need to see her in the anticoagulation clinic instead of having labs drawn. Pt verbalized understanding.

## 2022-07-10 ENCOUNTER — Telehealth: Payer: Self-pay

## 2022-07-10 NOTE — Telephone Encounter (Signed)
Pt did not go to lab as instructed to have PT/INR drawn. Called, no answer. Left message on voicemail.

## 2022-07-11 ENCOUNTER — Ambulatory Visit (INDEPENDENT_AMBULATORY_CARE_PROVIDER_SITE_OTHER): Payer: Medicare Other | Admitting: *Deleted

## 2022-07-11 ENCOUNTER — Telehealth: Payer: Self-pay | Admitting: *Deleted

## 2022-07-11 DIAGNOSIS — Z7901 Long term (current) use of anticoagulants: Secondary | ICD-10-CM | POA: Diagnosis not present

## 2022-07-11 DIAGNOSIS — G459 Transient cerebral ischemic attack, unspecified: Secondary | ICD-10-CM

## 2022-07-11 DIAGNOSIS — Z954 Presence of other heart-valve replacement: Secondary | ICD-10-CM

## 2022-07-11 LAB — PROTIME-INR
INR: 1.8 — ABNORMAL HIGH (ref 0.9–1.2)
Prothrombin Time: 18.9 s — ABNORMAL HIGH (ref 9.1–12.0)

## 2022-07-11 NOTE — Patient Instructions (Signed)
Description   Spoke with pt and instructed for pt to take 2 tablets of warfarin today then continue taking 1 tablet daily, except 2 tablets on Sundays and Fridays. Stay consistent with greens (3 times per week)  Recheck INR on Tuesday 07/16/2022. Coumadin Clinic [phone]

## 2022-07-11 NOTE — Telephone Encounter (Signed)
Spoke with pt and addressed her questions. Please refer to anticoagulation encounter.

## 2022-07-11 NOTE — Telephone Encounter (Signed)
Patient returned call

## 2022-07-16 ENCOUNTER — Ambulatory Visit: Payer: Medicare Other | Attending: Cardiology

## 2022-07-16 DIAGNOSIS — Z7901 Long term (current) use of anticoagulants: Secondary | ICD-10-CM | POA: Diagnosis not present

## 2022-07-16 DIAGNOSIS — G459 Transient cerebral ischemic attack, unspecified: Secondary | ICD-10-CM | POA: Diagnosis not present

## 2022-07-16 DIAGNOSIS — Z954 Presence of other heart-valve replacement: Secondary | ICD-10-CM

## 2022-07-16 LAB — POCT INR: INR: 1.2 — AB (ref 2.0–3.0)

## 2022-07-16 NOTE — Patient Instructions (Signed)
Description   Take 2 tablets today and 2 tablets tomorrow and then START taking 2 tablets daily EXCEPT 1 tablet on Mondays, Wednesdays, and Fridays.  Stay consistent with greens (3 times per week)  Recheck INR in 2 weeks  Coumadin Clinic [phone]

## 2022-07-25 ENCOUNTER — Other Ambulatory Visit: Payer: Self-pay | Admitting: Cardiology

## 2022-07-25 ENCOUNTER — Telehealth: Payer: Self-pay | Admitting: Cardiology

## 2022-07-25 DIAGNOSIS — Z5181 Encounter for therapeutic drug level monitoring: Secondary | ICD-10-CM

## 2022-07-25 MED ORDER — WARFARIN SODIUM 1 MG PO TABS: ORAL_TABLET | ORAL | 0 refills | Status: DC

## 2022-07-25 NOTE — Telephone Encounter (Signed)
*  STAT* If patient is at the pharmacy, call can be transferred to refill team.   1. Which medications need to be refilled? (please list name of each medication and dose if known) warfarin (COUMADIN) 1 MG tablet   2. Which pharmacy/location (including street and city if local pharmacy) is medication to be sent to? [facility] [place], [place]   3. Do they need a 30 day or 90 day supply? 42  Pt spouse states pt is completely out

## 2022-07-25 NOTE — Telephone Encounter (Signed)
Prescription refill request received for warfarin Lov: 05/22/22 [facility])  Next INR check: 07/30/22 Warfarin tablet strength: '1mg'$   Appropriate dose and refill sent to requested pharmacy.

## 2022-07-30 ENCOUNTER — Telehealth: Payer: Self-pay

## 2022-07-30 ENCOUNTER — Ambulatory Visit: Payer: No Typology Code available for payment source

## 2022-07-30 NOTE — Telephone Encounter (Signed)
Pt missed today's anticoagulation appt. Called and spoke with pt. Made her aware since her INR was subtherapeutic last visit, it is very important that she comes to appts to have INR checked. Pt verbalized understanding and rescheduled her appt to next Tuesday. Reviewed Warfarin dosing instructions with pt to confirm she is taking the correct dose.

## 2022-08-06 ENCOUNTER — Ambulatory Visit: Payer: No Typology Code available for payment source

## 2022-08-06 ENCOUNTER — Telehealth: Payer: Self-pay

## 2022-08-06 NOTE — Telephone Encounter (Signed)
Received message from [facility] stating pt called and  wanted [place] coumadin clinic to call her back.   Called and spoke with pt. Mrs Cottam stated she fell at home Sunday night and is now hospitalized at [facility]. She stated they have been checking her INR and administering Warfarin. She also stated she made the healthcare providers aware that her INR has not been therapeutic recently. I made pt aware that while she is admitted, [facility] providers will manage her Warfarin/INR; however, she will need to follow-up with [facility] when she is discharged. Pt stated she believes she will be discharged within 1-2 day and scheduled appt wIth Coumadin Clinic for next Tuesday, 08/13/22.

## 2022-08-13 ENCOUNTER — Telehealth: Payer: Self-pay

## 2022-08-13 ENCOUNTER — Other Ambulatory Visit: Payer: Self-pay

## 2022-08-13 ENCOUNTER — Ambulatory Visit: Payer: No Typology Code available for payment source | Attending: Cardiology

## 2022-08-13 DIAGNOSIS — Z954 Presence of other heart-valve replacement: Secondary | ICD-10-CM

## 2022-08-13 NOTE — Addendum Note (Signed)
Addended by: Leonidas Romberg on: 08/13/2022 04:08 PM   Modules accepted: Orders

## 2022-08-13 NOTE — Telephone Encounter (Signed)
Pt missed scheduled appt with Coumadin Clinic. Called to reschedule appt or place lab order for PT/INR at [place], no answer. Voicemail full.  Called pt' s husband, no answer. Left voicemail.    Pt's INR has not been therapeutic since June. Coumadin Clinic will continue to stress the importance of coming to appts and being complaint with Warfarin. Pt has been made aware of risk.

## 2022-08-13 NOTE — Telephone Encounter (Signed)
Pt's husband returned call and stated [name] can come to [facility] lab in the morning to check PT/INR. Lab order placed.

## 2022-08-14 NOTE — Progress Notes (Signed)
Received call from pt stating she is still in [facility] and will go to lab to have PT/INR drawn in the morning (08/15/22) at [facility] office.

## 2022-08-20 ENCOUNTER — Ambulatory Visit: Payer: Medicare Other | Attending: Cardiology

## 2022-08-20 DIAGNOSIS — Z7901 Long term (current) use of anticoagulants: Secondary | ICD-10-CM | POA: Diagnosis not present

## 2022-08-20 DIAGNOSIS — G459 Transient cerebral ischemic attack, unspecified: Secondary | ICD-10-CM | POA: Diagnosis not present

## 2022-08-20 LAB — POCT INR: INR: 1 — AB (ref 2.0–3.0)

## 2022-08-20 NOTE — Patient Instructions (Signed)
Description   Take 3 tablets today and then START taking 2 tablets daily.  Recheck INR in 1 week.  Coumadin Clinic [phone]

## 2022-08-27 ENCOUNTER — Ambulatory Visit: Payer: No Typology Code available for payment source

## 2022-08-27 ENCOUNTER — Telehealth: Payer: Self-pay

## 2022-08-27 NOTE — Telephone Encounter (Signed)
Pt missed scheduled appt at Coumadin Clinic this morning in [place].   Called pt due to missed appt. I made her aware of the importance of coming to Coumadin Clinic appt and having INR checked. Pt also made aware of the risk since her INR has not been therapeutic. Pt agreed to come this afternoon at 1:45pm.    Pt did not show to second scheduled appt. Called pt to reschedule, no answer. Left message on voicemail.

## 2022-09-04 DIAGNOSIS — L03116 Cellulitis of left lower limb: Secondary | ICD-10-CM | POA: Insufficient documentation

## 2022-09-05 ENCOUNTER — Telehealth: Payer: Self-pay | Admitting: *Deleted

## 2022-09-05 NOTE — Telephone Encounter (Signed)
Pt called and LMOM. Called pt back who stated that she was currently in the hospital and is going to be bridged with lovenox because her INR is low.   Informed pt that  she has an appointment scheduled for 09/10/2022 in [place] at 3:30.   Informed pt that if she is not out of the hospital to call office and let us know when she is discharged. Pt verbalized understanding.

## 2022-09-06 DIAGNOSIS — E559 Vitamin D deficiency, unspecified: Secondary | ICD-10-CM | POA: Insufficient documentation

## 2022-09-06 DIAGNOSIS — F3110 Bipolar disorder, current episode manic without psychotic features, unspecified: Secondary | ICD-10-CM | POA: Insufficient documentation

## 2022-09-10 ENCOUNTER — Ambulatory Visit: Payer: Medicare Other

## 2022-09-10 NOTE — Telephone Encounter (Signed)
Per hospitalization note from [facility] on 09/03/22, INR goal was 2.5-3.5 during hospitalization.  Sent Dr Bettina Gavia message for clarification.   Per Dr Bettina Gavia:   Bettina Gavia, Hilton Cork, MD  Leonidas Romberg, RN AVR 03/30/2013 [employer]  She had a leaflet thrombosis I think her INR has been in the range 2-3 especially because she has had problems with iron deficiency anemia and dysfunctional uterine bleeding.  She does not have mechanical AVR.

## 2022-09-10 NOTE — Telephone Encounter (Signed)
Called and spoke with patient. Patient is currently hospitalized and should be discharged within 1 to 2 days. Rescheduled appt with Coumadin Clinic for 09/17/22. Pt verbalized understanding.

## 2022-09-12 ENCOUNTER — Telehealth: Payer: Self-pay | Admitting: Cardiology

## 2022-09-12 NOTE — Telephone Encounter (Signed)
  Pt said, she was in the hospital because of low BP. She said, they did not say anything if sh needs to changed her medication. She wants to know if she needs to continue taking all her medication based on Dr. Joya Gaskins prescriptions

## 2022-09-12 NOTE — Telephone Encounter (Signed)
Attempted to call patient and her voice mailbox was full so unable to leave a message.

## 2022-09-13 ENCOUNTER — Telehealth: Payer: Self-pay | Admitting: Cardiology

## 2022-09-13 MED ORDER — METOPROLOL SUCCINATE ER 25 MG PO TB24: ORAL_TABLET | ORAL | 0 refills | Status: DC

## 2022-09-13 NOTE — Telephone Encounter (Signed)
Please see below.

## 2022-09-13 NOTE — Telephone Encounter (Signed)
Full VM.

## 2022-09-13 NOTE — Telephone Encounter (Signed)
*  STAT* If patient is at the pharmacy, call can be transferred to refill team.   1. Which medications need to be refilled? (please list name of each medication and dose if known) metoprolol succinate (TOPROL-XL) 25 MG 24 hr tablet   2. Which pharmacy/location (including street and city if local pharmacy) is medication to be sent to? [place], [place]   3. Do they need a 30 day or 90 day supply? [place]

## 2022-09-13 NOTE — Telephone Encounter (Signed)
Medication sent.

## 2022-09-13 NOTE — Telephone Encounter (Signed)
Pt advised she had her medications filled and no longer needed any assistance from our office. Advised her that her VM was full.

## 2022-09-17 ENCOUNTER — Telehealth: Payer: Self-pay

## 2022-09-17 ENCOUNTER — Ambulatory Visit: Payer: No Typology Code available for payment source

## 2022-09-17 NOTE — Telephone Encounter (Signed)
Pt missed post hospitalization Coumadin Clinic appt. Called pt to determine if she could come today or rescheduled to next week. Pt was currently at physical therapy appt and could not come at this time. Spoke with pt's daughter who stated she could bring her mom to the Coumadin Clinic next week, 09/24/22. Emphasized the importance of coming to appts to have INR checked, especially since INR has not been therapeutic and she has been hospitalized. Aware of risk and verbalized understanding.

## 2022-09-24 ENCOUNTER — Ambulatory Visit: Payer: Medicare Other | Attending: Cardiology

## 2022-09-24 DIAGNOSIS — G459 Transient cerebral ischemic attack, unspecified: Secondary | ICD-10-CM | POA: Diagnosis not present

## 2022-09-24 DIAGNOSIS — Z7901 Long term (current) use of anticoagulants: Secondary | ICD-10-CM | POA: Diagnosis not present

## 2022-09-24 DIAGNOSIS — Z954 Presence of other heart-valve replacement: Secondary | ICD-10-CM

## 2022-09-24 LAB — POCT INR: INR: 7.4 — AB (ref 2.0–3.0)

## 2022-09-24 NOTE — Patient Instructions (Signed)
Description   HOLD Warfarin today, tomorrow, and Thursday. Only take 1/2 tablet on Friday and then START taking 2 tablets daily EXCEPT 1 tablet on Mondays, Wednesdays, and Fridays.  Recheck INR in 1 week.  Coumadin Clinic [phone]

## 2022-10-01 ENCOUNTER — Telehealth: Payer: Self-pay | Admitting: Cardiology

## 2022-10-01 ENCOUNTER — Ambulatory Visit: Payer: Medicare Other | Attending: Cardiology

## 2022-10-01 DIAGNOSIS — Z954 Presence of other heart-valve replacement: Secondary | ICD-10-CM

## 2022-10-01 DIAGNOSIS — G459 Transient cerebral ischemic attack, unspecified: Secondary | ICD-10-CM | POA: Diagnosis not present

## 2022-10-01 DIAGNOSIS — Z7901 Long term (current) use of anticoagulants: Secondary | ICD-10-CM | POA: Diagnosis not present

## 2022-10-01 LAB — POCT INR: INR: 3.4 — AB (ref 2.0–3.0)

## 2022-10-01 NOTE — Patient Instructions (Signed)
Description   HOLD today's dose and then START taking 1 tablet daily EXCEPT 2 tablets on Tuesday, Thursday, and Saturday. Recheck INR in 1 week.  Coumadin Clinic [phone]

## 2022-10-03 ENCOUNTER — Ambulatory Visit: Payer: Self-pay | Admitting: Adult Health

## 2022-10-04 ENCOUNTER — Ambulatory Visit (INDEPENDENT_AMBULATORY_CARE_PROVIDER_SITE_OTHER): Payer: Medicare Other | Admitting: Podiatry

## 2022-10-04 DIAGNOSIS — L84 Corns and callosities: Secondary | ICD-10-CM

## 2022-10-04 DIAGNOSIS — I890 Lymphedema, not elsewhere classified: Secondary | ICD-10-CM

## 2022-10-04 DIAGNOSIS — M79674 Pain in right toe(s): Secondary | ICD-10-CM | POA: Diagnosis not present

## 2022-10-04 DIAGNOSIS — M2042 Other hammer toe(s) (acquired), left foot: Secondary | ICD-10-CM

## 2022-10-04 DIAGNOSIS — B351 Tinea unguium: Secondary | ICD-10-CM | POA: Diagnosis not present

## 2022-10-04 DIAGNOSIS — M79675 Pain in left toe(s): Secondary | ICD-10-CM

## 2022-10-04 DIAGNOSIS — I739 Peripheral vascular disease, unspecified: Secondary | ICD-10-CM

## 2022-10-04 NOTE — Progress Notes (Signed)
  Subjective:  Patient ID: [name], female    DOB: 1956-07-11,  MRN: [id number]  Chief Complaint  Patient presents with   Nail Problem    Nail trim     67 y.o. female presents with the above complaint. History confirmed with patient. Patient presenting with pain related to dystrophic thickened elongated nails. Patient is unable to trim own nails related to nail dystrophy and/or mobility issues. Patient does not have a history of T2DM  but does have history PVD/ lymphedema and is on warfarin chronically. Patient does have callus present located at the distal aspect of the left 3rd toe causing pain.   Objective:  Physical Exam: warm, good capillary refill, DP and PT pulses 24 bilateral nail exam onychomycosis of the toenails, onycholysis, and dystrophic nails DP pulses palpable, PT pulses palpable, and protective sensation intact Left Foot:  Pain with palpation of nails due to elongation and dystrophic growth. Hyperkeratotic lesion present present to distal tuft of left 3rd toe, hammertoe deformity present. Also with open wound on left leg with eschar related to chronic lymphedema left leg.  Right Foot: Pain with palpation of nails due to elongation and dystrophic growth.   Assessment:   1. Pain due to onychomycosis of toenails of both feet   2. Pre-ulcerative calluses   3. Hammertoe of left foot   4. PVD (peripheral vascular disease) ([place])   5. Lymphedema       Plan:  Patient was evaluated and treated and all questions answered.  #Hyperkeratotic lesions/pre ulcerative calluses present distal aspect left 3rd toe All symptomatic hyperkeratoses x 1 separate lesions were safely debrided with a sterile #10 blade to patient's level of comfort without incident. We discussed preventative and palliative care of these lesions including supportive and accommodative shoegear, padding, prefabricated and custom molded accommodative orthoses, use of a pumice stone and lotions/creams  daily.  #Onychomycosis with pain  -Nails palliatively debrided as below. -Educated on self-care  Procedure: Nail Debridement Rationale: Pain Type of Debridement: manual, sharp debridement. Instrumentation: Nail nipper, rotary burr. Number of Nails: 10  Return in about 3 months (around 01/03/2023) for RFC.         Everitt Amber, DPM [facility]

## 2022-10-07 ENCOUNTER — Encounter: Payer: Self-pay | Admitting: Adult Health

## 2022-10-07 ENCOUNTER — Ambulatory Visit (INDEPENDENT_AMBULATORY_CARE_PROVIDER_SITE_OTHER): Payer: Medicare Other | Admitting: Adult Health

## 2022-10-07 VITALS — Ht 61.0 in

## 2022-10-07 DIAGNOSIS — Z79899 Other long term (current) drug therapy: Secondary | ICD-10-CM

## 2022-10-07 DIAGNOSIS — F319 Bipolar disorder, unspecified: Secondary | ICD-10-CM

## 2022-10-07 MED ORDER — DIVALPROEX SODIUM ER 500 MG PO TB24: 500.0000 mg | ORAL_TABLET | Freq: Every day | ORAL | 2 refills | Status: DC

## 2022-10-07 MED ORDER — DIVALPROEX SODIUM ER 250 MG PO TB24: 250.0000 mg | ORAL_TABLET | Freq: Every day | ORAL | 2 refills | Status: DC

## 2022-10-07 MED ORDER — ARIPIPRAZOLE 10 MG PO TABS: 10.0000 mg | ORAL_TABLET | Freq: Every day | ORAL | 2 refills | Status: DC
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
## 2022-10-08 ENCOUNTER — Ambulatory Visit: Payer: Medicare Other | Attending: Cardiology

## 2022-10-08 ENCOUNTER — Ambulatory Visit: Payer: Medicare Other

## 2022-10-08 DIAGNOSIS — Z7901 Long term (current) use of anticoagulants: Secondary | ICD-10-CM | POA: Diagnosis not present

## 2022-10-08 DIAGNOSIS — G459 Transient cerebral ischemic attack, unspecified: Secondary | ICD-10-CM | POA: Diagnosis not present

## 2022-10-08 LAB — POCT INR: INR: 2.7 (ref 2.0–3.0)

## 2022-10-08 NOTE — Patient Instructions (Signed)
Description   Continue taking 1 tablet daily EXCEPT 2 tablets on Tuesday, Thursday, and Saturday. Stay consistent with greens each week  Recheck INR in 2 weeks.  [facility] [phone]

## 2022-10-21 ENCOUNTER — Telehealth: Payer: Self-pay | Admitting: Cardiology

## 2022-10-21 NOTE — Telephone Encounter (Signed)
Left a vm for [facility] to callback.

## 2022-10-21 NOTE — Telephone Encounter (Signed)
Pt's [place] nurse calling to report that pt has had a 9 lb weight gain since Friday. She would like a callback regarding this matter. Please advise

## 2022-10-22 ENCOUNTER — Ambulatory Visit: Payer: Medicare Other | Attending: Cardiology

## 2022-10-22 DIAGNOSIS — G459 Transient cerebral ischemic attack, unspecified: Secondary | ICD-10-CM | POA: Diagnosis present

## 2022-10-22 DIAGNOSIS — Z7901 Long term (current) use of anticoagulants: Secondary | ICD-10-CM | POA: Insufficient documentation

## 2022-10-22 LAB — POCT INR: INR: 1.9 — AB (ref 2.0–3.0)

## 2022-10-22 NOTE — Patient Instructions (Signed)
Description   Take 2.5 tablets today and then continue taking 1 tablet daily EXCEPT 2 tablets on Tuesday, Thursday, and Saturday. Stay consistent with greens each week  Recheck INR in 2 weeks.  [facility] [phone]

## 2022-10-28 NOTE — Telephone Encounter (Signed)
Left message for [facility] the home health nurse to call back.

## 2022-10-28 NOTE — Telephone Encounter (Signed)
[place] nurse, and she reported that the patient's weight was 239 when she had gained 9 lbs. And now her weight was 227. The patient is doing fine and has no concerns at this time.

## 2022-11-05 ENCOUNTER — Ambulatory Visit: Payer: Medicare Other | Attending: Cardiology

## 2022-11-05 ENCOUNTER — Telehealth: Payer: Self-pay

## 2022-11-05 DIAGNOSIS — G459 Transient cerebral ischemic attack, unspecified: Secondary | ICD-10-CM | POA: Insufficient documentation

## 2022-11-05 DIAGNOSIS — Z954 Presence of other heart-valve replacement: Secondary | ICD-10-CM | POA: Insufficient documentation

## 2022-11-05 DIAGNOSIS — Z7901 Long term (current) use of anticoagulants: Secondary | ICD-10-CM | POA: Insufficient documentation

## 2022-11-05 LAB — POCT INR: INR: 5.1 — AB (ref 2.0–3.0)

## 2022-11-05 NOTE — Patient Outreach (Signed)
  Care Coordination   11/05/2022 Name: [name] MRN: [id number] DOB: 06-13-1956   Care Coordination Outreach Attempts:  An unsuccessful telephone outreach was attempted today to offer the patient information about available care coordination services as a benefit of their health plan.   Follow Up Plan:  Additional outreach attempts will be made to offer the patient care coordination information and services.   Encounter Outcome:  No Answer   Care Coordination Interventions:  No, not indicated    Tomasa Rand, RN, BSN, [facility] [employer] [phone]

## 2022-11-05 NOTE — Patient Instructions (Signed)
Description   HOLD today's dose and tomorrow's dose and then continue taking 1 tablet daily EXCEPT 2 tablets on Tuesday, Thursday, and Saturday. Stay consistent with greens each week  Recheck INR in 2 weeks [facility] [phone]

## 2022-11-06 ENCOUNTER — Telehealth: Payer: Self-pay

## 2022-11-06 NOTE — Patient Outreach (Signed)
  Care Coordination   11/06/2022 Name: [name] MRN: [id number] DOB: October 31, 1955   Care Coordination Outreach Attempts:  A second unsuccessful outreach was attempted today to offer the patient with information about available care coordination services as a benefit of their health plan.     Follow Up Plan:  Additional outreach attempts will be made to offer the patient care coordination information and services.   Encounter Outcome:  No Answer   Care Coordination Interventions:  No, not indicated    Tomasa Rand, RN, BSN, [facility] [employer] [phone]

## 2022-11-07 ENCOUNTER — Ambulatory Visit (INDEPENDENT_AMBULATORY_CARE_PROVIDER_SITE_OTHER): Payer: Medicare Other | Admitting: Adult Health

## 2022-11-07 ENCOUNTER — Encounter: Payer: Self-pay | Admitting: Adult Health

## 2022-11-07 DIAGNOSIS — F319 Bipolar disorder, unspecified: Secondary | ICD-10-CM

## 2022-11-07 NOTE — Progress Notes (Signed)
[name] [id number] 1956-03-14 67 y.o.  Subjective:   Patient ID:  [name] is a 67 y.o. (DOB Jun 03, 1956) female.  Chief Complaint: No chief complaint on file.   HPI [name] presents to the office today for follow-up of Bipolar Disorder.  Accompanied by daughter.  Describes mood today as "not the best". Pleasant. Denies tearfulness. Mood symptoms - reports depression, anxiety, and irritability. Reports some worry. Denies manic symptoms - "more the opposite". Mood is lower. Stating "I'm ok". Feels like the Abilify and Depakote have been helpful, but may need to be adjusted with mood decline. Daughter also feels mood is more down - "maybe too much". Willing to consider options. Husband recovering at a rehabilitation center. Varying interest and motivation. Taking medications as prescribed - children ensuring she is taking medications daily.  Energy levels lower. Active, does not have a regular exercise routine with physical disabilities.  Enjoys some usual interests and activities. Married. Lives with husband. Spending time with family. Appetite decreased. Weight loss. Sleeps well most nights. Averages 6 to 7 hours of broken sleep. Reports some daytime napping. Focus and concentration stable. Completing tasks. Managing aspects of household. Retired. Denies SI or HI.  Denies AH or VH. Denies self harm. Denies substance use.  Previous medication trials: Pristiq, Wellbutrin, Cymbalta, other medications    Flowsheet Row ED from 05/20/2022 in [facility] Emergency Department at [facility] ED to Hosp-Admission (Discharged) from 03/22/2021 in [place] No Risk No Risk        Review of Systems:  Review of Systems  Musculoskeletal:  Negative for gait problem.  Neurological:  Negative for tremors.  Psychiatric/Behavioral:         Please refer to HPI    Medications: I have reviewed the patient's current  medications.  Current Outpatient Medications  Medication Sig Dispense Refill   acetaminophen (TYLENOL) 325 MG tablet Take 2 tablets (650 mg total) by mouth every 6 (six) hours as needed for mild pain or headache. 30 tablet    ARIPiprazole (ABILIFY) 10 MG tablet Take 1 tablet (10 mg total) by mouth daily. 30 tablet 2   camphor-menthol (SARNA) lotion Apply 1 application topically as needed for itching. 222 mL 0   divalproex (DEPAKOTE ER) 500 MG 24 hr tablet Take 1 tablet (500 mg total) by mouth daily. Take one tablet at bedtime. 30 tablet 2   docusate sodium (COLACE) 100 MG capsule Take 200 mg by mouth daily.     enoxaparin (LOVENOX) 100 MG/ML injection Inject 1 mL (100 mg total) into the skin every 12 (twelve) hours. 10 mL 1   Ferrous Sulfate Dried (HIGH POTENCY IRON) 65 MG TABS Take 1 tablet by mouth daily.     folic acid (FOLVITE) 1 MG tablet Take 1 tablet (1 mg total) by mouth daily.     levonorgestrel (MIRENA) 20 MCG/DAY IUD 1 each by Intrauterine route once.     metoprolol succinate (TOPROL-XL) 25 MG 24 hr tablet TAKE 2 TABLETS BY MOUTH IN THE MORNING AND 1 IN THE EVENING 270 tablet 0   oxybutynin (DITROPAN-XL) 5 MG 24 hr tablet Take 5 mg by mouth every evening.     potassium chloride (KLOR-CON) 10 MEQ tablet Take 10 mEq by mouth 3 (three) times daily.     pravastatin (PRAVACHOL) 40 MG tablet Take 1 tablet (40 mg total) by mouth daily at 6 PM. 30 tablet 0   senna-docusate (SENOKOT-S) 8.6-50  MG tablet Take 1 tablet by mouth 2 (two) times daily.     torsemide (DEMADEX) 20 MG tablet Take 1 tablet (20 mg total) by mouth daily. Please take torsemide 20 mg twice daily if weight greater than 210lbs. 90 tablet 3   vitamin B-12 1000 MCG tablet Take 1 tablet (1,000 mcg total) by mouth daily.     warfarin (COUMADIN) 1 MG tablet TAKE 1 TO 2 TABLETS BY MOUTH ONCE DAILY AS DIRECTED BY  COUMADIN  CLINIC 45 tablet 0   warfarin (COUMADIN) 1 MG tablet TAKE 1 TO 2 TABLETS BY MOUTH ONCE DAILY AS DIRECTED BY  COUMADIN CLINIC 150 tablet 0   No current facility-administered medications for this visit.    Medication Side Effects: None  Allergies:  Allergies  Allergen Reactions   Vancomycin Itching    Patient can receive vancomycin with slower infusion and prn benadryl for itching  Patient can receive vancomycin with slower infusion and prn benadryl for itching     Patient can receive vancomycin with slower infusion and prn benadryl for itching    Past Medical History:  Diagnosis Date   Anemia 06/03/2019   Aortic stenosis 03/23/2013   Overview:  02/18/13 TTE EF >55%. Critical AS with mean Ao valve gradient of 82 mm Hg. No AI. No MR, PR, mild TR. Estimated RVSP 30 mm Hg.   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 Postmenopausal bleeding since for the past 2 years.  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 Last biopsy was in May of 2019 which showed weakly proliferative endometrium possibly a polyp.      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    Past Medical History, Surgical history, Social history, and Family history were reviewed and updated as appropriate.   Please see review of systems for further details on the patient's review from today.   Objective:   Physical Exam:  There were no vitals taken for this visit.  Physical Exam Constitutional:      General: She is not in acute distress. Musculoskeletal:        General: No deformity.  Neurological:     Mental Status: She is alert and oriented to person, place, and time.     Coordination: Coordination normal.  Psychiatric:        Attention and Perception: Attention and perception normal. She does not perceive auditory or visual hallucinations.        Mood and Affect: Mood normal.  Mood is not anxious or depressed. Affect is not labile, blunt, angry or inappropriate.        Speech: Speech normal.        Behavior: Behavior normal.        Thought Content: Thought content normal. Thought content is not paranoid or delusional. Thought content does not include homicidal or suicidal ideation. Thought content does not include homicidal or suicidal plan.        Cognition and Memory: Cognition and memory normal.        Judgment: Judgment normal.     Comments: Insight intact     Lab Review:     Component Value Date/Time   NA 139 08/07/2021 0000   K 4.4 08/07/2021 0000   CL 102 08/07/2021 0000   CO2 26 (A) 08/07/2021 0000   GLUCOSE 98 07/16/2021 1433   GLUCOSE 88 03/31/2021 0042   BUN 21 08/07/2021 0000   CREATININE 1.2 (A) 08/07/2021 0000   CREATININE 0.93 07/16/2021 1433   CALCIUM 9.6 08/07/2021 0000   PROT 8.4 (A) 08/07/2021 0000   ALBUMIN 4.4 08/07/2021 0000   ALBUMIN 3.9 07/16/2021 1433   AST 26 08/07/2021 0000   ALT 17 08/07/2021 0000   ALKPHOS 66 08/07/2021 0000   BILITOT 0.3 07/16/2021 1433   GFRNONAA >60 03/31/2021 0042   GFRAA 74 06/06/2020 0903        Component Value Date/Time   WBC 13.0 08/07/2021 0000   WBC 13.4 (H) 04/02/2021 0008   RBC 5.45 (A) 08/07/2021 0000   HGB 15.7 08/07/2021 0000   HGB 13.1 07/16/2021 1433   HCT 48 (A) 08/07/2021 0000   HCT 40.7 07/16/2021 1433   PLT 254 08/07/2021 0000   PLT 240 07/16/2021 1433   MCV 88 08/07/2021 0000   MCH 28.4 07/16/2021 1433   MCH 27.9 04/02/2021 0008   MCHC 32.2 07/16/2021 1433   MCHC 30.4 04/02/2021 0008   RDW 14.2 07/16/2021 1433   LYMPHSABS 2.0 03/31/2021 0042   MONOABS 0.9 03/31/2021 0042   EOSABS 0.8 (H) 03/31/2021 0042   BASOSABS 0.1 03/31/2021 0042    No results found for: "POCLITH", "LITHIUM"   No results found for: "PHENYTOIN", "PHENOBARB", "VALPROATE", "CBMZ"   .res Assessment: Plan:    Plan:  PDMP reviewed  Started in late December:  Abilify 10 mg daily - mood stabilization Depakote ER '500mg'$  daily - mood stabilization  Add Depakite ER '250mg'$  - mood stabilization  RTC 4 weeks   Time spent with patient was 15 minutes. Greater than 50% of face to face time with patient was spent on counseling and coordination of care.    Patient advised to contact office with any questions, adverse effects, or acute worsening in signs and symptoms.  Discussed potential metabolic side effects associated with atypical antipsychotics, as well as potential risk for movement side effects. Advised pt to contact office if movement side effects occur.    Discussed potential benefits, risks, and side effects of Depakote and need for period lab monitoring to assess for potential adverse effects to include obtaining CBC and LFTs.   Diagnoses and all orders for this visit:  Bipolar I disorder ([place])     Please see After Visit Summary for patient specific instructions.  Future Appointments  Date Time Provider [place]  11/19/2022 11:45 AM CVD-ASH COUMADIN CVD-[facility] None  12/04/2022  3:40 PM Jessicaann Overbaugh, Berdie Ogren, NP CP-CP None  12/31/2022  1:15 PM Yevonne Pax, DPM [facility]  03/04/2023 11:00 AM  Richardo Priest, MD CVD-[facility] None    No orders of the defined types were placed in this encounter.   -------------------------------

## 2022-11-08 ENCOUNTER — Telehealth: Payer: Self-pay

## 2022-11-08 NOTE — Patient Outreach (Signed)
  Care Coordination   11/08/2022 Name: [name] MRN: [id number] DOB: 02-28-1956   Care Coordination Outreach Attempts:  A third unsuccessful outreach was attempted today to offer the patient with information about available care coordination services as a benefit of their health plan.   Follow Up Plan:  No further outreach attempts will be made at this time. We have been unable to contact the patient to offer or enroll patient in care coordination services  Encounter Outcome:  No Answer   Care Coordination Interventions:  No, not indicated    Tomasa Rand, RN, BSN, CEN [place] Coordinator [phone]

## 2022-11-19 ENCOUNTER — Ambulatory Visit: Payer: Medicare Other | Attending: Cardiology

## 2022-11-19 DIAGNOSIS — G459 Transient cerebral ischemic attack, unspecified: Secondary | ICD-10-CM

## 2022-11-19 DIAGNOSIS — Z7901 Long term (current) use of anticoagulants: Secondary | ICD-10-CM | POA: Diagnosis present

## 2022-11-19 LAB — POCT INR: INR: 3.5 — AB (ref 2.0–3.0)

## 2022-11-19 NOTE — Patient Instructions (Signed)
Description   [place] take 1 tablet today and then START taking 1 tablet daily EXCEPT 2 tablets on Tuesdays and Thursdays.  Stay consistent with greens each week  Recheck INR in 2 weeks.  [facility]in [facility] [phone]

## 2022-12-03 ENCOUNTER — Ambulatory Visit: Payer: Medicare Other | Attending: Cardiology

## 2022-12-03 DIAGNOSIS — Z7901 Long term (current) use of anticoagulants: Secondary | ICD-10-CM | POA: Diagnosis present

## 2022-12-03 DIAGNOSIS — G459 Transient cerebral ischemic attack, unspecified: Secondary | ICD-10-CM | POA: Diagnosis present

## 2022-12-03 LAB — POCT INR: INR: 5.7 — AB (ref 2.0–3.0)

## 2022-12-03 NOTE — Patient Instructions (Signed)
Description   HOLD tomorrow's dose and Thursdays dose and then START taking 1 tablet daily EXCEPT 2 tablets on Tuesdays.  Stay consistent with greens each week  Recheck INR in 1 week.  Coumadin Clinic [phone]

## 2022-12-04 ENCOUNTER — Encounter: Payer: Self-pay | Admitting: Adult Health

## 2022-12-04 ENCOUNTER — Ambulatory Visit (INDEPENDENT_AMBULATORY_CARE_PROVIDER_SITE_OTHER): Payer: Medicare Other | Admitting: Adult Health

## 2022-12-04 DIAGNOSIS — F319 Bipolar disorder, unspecified: Secondary | ICD-10-CM | POA: Diagnosis not present

## 2022-12-04 NOTE — Progress Notes (Signed)
[name] [id number] Sep 05, 1956 67 y.o.  Subjective:   Patient ID:  [name] is a 67 y.o. (DOB 1956/02/25) female.  Chief Complaint: No chief complaint on file.   HPI [name] presents to the office today for follow-up of  Bipolar Disorder.  Accompanied by husband.Reports a few xcycles of a 2 year depression.  Describes mood today as "not the best". Pleasant. Denies tearfulness. Mood symptoms - reports depression and anxiety. Denies irritability. Reports some worry and over thinking. Denies mania. Mood is lower. Stating "I wish I could be motivated to do things I need to do". Feels like the Abilify and Depakote have been helpful. Would like to continue current medication regimen for now. Varying interest and motivation. Taking medications as prescribed - children ensuring she is taking medications daily.  Energy levels lower. Active, does not have a regular exercise routine with physical disabilities.  Enjoys some usual interests and activities. Married. Lives with husband. Spending time with family. Appetite decreased. Weight loss - "some". Sleeps better some nights than others. Averages 5 hours of broken sleep - sleeps 3 hours, up for an hour, and then sleeps 2 more hours. Reports some daytime napping. Focus and concentration difficulties. Completing tasks. Managing aspects of household. Retired. Denies SI or HI.  Denies AH or VH. Denies self harm. Denies substance use.  Previous medication trials: Pristiq, Wellbutrin, Cymbalta, other medications   Flowsheet Row ED from 05/20/2022 in [facility] Emergency Department at [facility] ED to Hosp-Admission (Discharged) from 03/22/2021 in [place] No Risk No Risk        Review of Systems:  Review of Systems  Musculoskeletal:  Negative for gait problem.  Neurological:  Negative for tremors.  Psychiatric/Behavioral:         Please refer to HPI    Medications: I  have reviewed the patient's current medications.  Current Outpatient Medications  Medication Sig Dispense Refill   acetaminophen (TYLENOL) 325 MG tablet Take 2 tablets (650 mg total) by mouth every 6 (six) hours as needed for mild pain or headache. 30 tablet    ARIPiprazole (ABILIFY) 10 MG tablet Take 1 tablet (10 mg total) by mouth daily. 30 tablet 2   camphor-menthol (SARNA) lotion Apply 1 application topically as needed for itching. 222 mL 0   divalproex (DEPAKOTE ER) 500 MG 24 hr tablet Take 1 tablet (500 mg total) by mouth daily. Take one tablet at bedtime. 30 tablet 2   docusate sodium (COLACE) 100 MG capsule Take 200 mg by mouth daily.     enoxaparin (LOVENOX) 100 MG/ML injection Inject 1 mL (100 mg total) into the skin every 12 (twelve) hours. 10 mL 1   Ferrous Sulfate Dried (HIGH POTENCY IRON) 65 MG TABS Take 1 tablet by mouth daily.     folic acid (FOLVITE) 1 MG tablet Take 1 tablet (1 mg total) by mouth daily.     levonorgestrel (MIRENA) 20 MCG/DAY IUD 1 each by Intrauterine route once.     metoprolol succinate (TOPROL-XL) 25 MG 24 hr tablet TAKE 2 TABLETS BY MOUTH IN THE MORNING AND 1 IN THE EVENING 270 tablet 0   oxybutynin (DITROPAN-XL) 5 MG 24 hr tablet Take 5 mg by mouth every evening.     potassium chloride (KLOR-CON) 10 MEQ tablet Take 10 mEq by mouth 3 (three) times daily.     pravastatin (PRAVACHOL) 40 MG tablet Take 1 tablet (40 mg total) by  mouth daily at 6 PM. 30 tablet 0   senna-docusate (SENOKOT-S) 8.6-50 MG tablet Take 1 tablet by mouth 2 (two) times daily.     torsemide (DEMADEX) 20 MG tablet Take 1 tablet (20 mg total) by mouth daily. Please take torsemide 20 mg twice daily if weight greater than 210lbs. 90 tablet 3   vitamin B-12 1000 MCG tablet Take 1 tablet (1,000 mcg total) by mouth daily.     warfarin (COUMADIN) 1 MG tablet TAKE 1 TO 2 TABLETS BY MOUTH ONCE DAILY AS DIRECTED BY  COUMADIN  CLINIC 45 tablet 0   warfarin (COUMADIN) 1 MG tablet TAKE 1 TO 2 TABLETS BY  MOUTH ONCE DAILY AS DIRECTED BY COUMADIN CLINIC 150 tablet 0   No current facility-administered medications for this visit.    Medication Side Effects: None  Allergies:  Allergies  Allergen Reactions   Vancomycin Itching    Patient can receive vancomycin with slower infusion and prn benadryl for itching  Patient can receive vancomycin with slower infusion and prn benadryl for itching     Patient can receive vancomycin with slower infusion and prn benadryl for itching    Past Medical History:  Diagnosis Date   Anemia 06/03/2019   Aortic stenosis 03/23/2013   Overview:  02/18/13 TTE EF >55%. Critical AS with mean Ao valve gradient of 82 mm Hg. No AI. No MR, PR, mild TR. Estimated RVSP 30 mm Hg.   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 Postmenopausal bleeding since for the past 2 years.  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 Last biopsy was in May of 2019 which showed weakly proliferative  endometrium possibly a polyp.     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    Past Medical History, Surgical history, Social history, and Family history were reviewed and updated as appropriate.   Please see review of systems for further details on the patient's review from today.   Objective:   Physical Exam:  There were no vitals taken for this visit.  Physical Exam Constitutional:      General: She is not in acute distress. Musculoskeletal:        General: No deformity.  Neurological:     Mental Status: She is alert and oriented to person, place, and time.     Coordination: Coordination normal.  Psychiatric:        Attention and Perception: Attention and perception normal. She does not perceive auditory or visual  hallucinations.        Mood and Affect: Mood normal. Mood is not anxious or depressed. Affect is not labile, blunt, angry or inappropriate.        Speech: Speech normal.        Behavior: Behavior normal.        Thought Content: Thought content normal. Thought content is not paranoid or delusional. Thought content does not include homicidal or suicidal ideation. Thought content does not include homicidal or suicidal plan.        Cognition and Memory: Cognition and memory normal.        Judgment: Judgment normal.     Comments: Insight intact     Lab Review:     Component Value Date/Time   NA 139 08/07/2021 0000   K 4.4 08/07/2021 0000   CL 102 08/07/2021 0000   CO2 26 (A) 08/07/2021 0000   GLUCOSE 98 07/16/2021 1433   GLUCOSE 88 03/31/2021 0042   BUN 21 08/07/2021 0000   CREATININE 1.2 (A) 08/07/2021 0000   CREATININE 0.93 07/16/2021 1433   CALCIUM 9.6 08/07/2021 0000   PROT 8.4 (A) 08/07/2021 0000   ALBUMIN 4.4 08/07/2021 0000   ALBUMIN 3.9 07/16/2021 1433   AST 26 08/07/2021 0000   ALT 17 08/07/2021 0000   ALKPHOS 66 08/07/2021 0000   BILITOT 0.3 07/16/2021 1433   GFRNONAA >60 03/31/2021 0042    GFRAA 74 06/06/2020 0903       Component Value Date/Time   WBC 13.0 08/07/2021 0000   WBC 13.4 (H) 04/02/2021 0008   RBC 5.45 (A) 08/07/2021 0000   HGB 15.7 08/07/2021 0000   HGB 13.1 07/16/2021 1433   HCT 48 (A) 08/07/2021 0000   HCT 40.7 07/16/2021 1433   PLT 254 08/07/2021 0000   PLT 240 07/16/2021 1433   MCV 88 08/07/2021 0000   MCH 28.4 07/16/2021 1433   MCH 27.9 04/02/2021 0008   MCHC 32.2 07/16/2021 1433   MCHC 30.4 04/02/2021 0008   RDW 14.2 07/16/2021 1433   LYMPHSABS 2.0 03/31/2021 0042   MONOABS 0.9 03/31/2021 0042   EOSABS 0.8 (H) 03/31/2021 0042   BASOSABS 0.1 03/31/2021 0042    No results found for: "POCLITH", "LITHIUM"   No results found for: "PHENYTOIN", "PHENOBARB", "VALPROATE", "CBMZ"   .res Assessment: Plan:    Plan:  PDMP reviewed  Started in late December:  Abilify 10 mg daily - mood stabilization Depakote ER 500mg  daily - mood stabilization  RTC 4 to 6 weeks  Time spent with patient was 15 minutes. Greater than 50% of face to face time with patient was spent on counseling and coordination of care.    Patient advised to contact office with any questions, adverse effects, or acute worsening in signs and symptoms.  Discussed potential metabolic side effects associated with atypical antipsychotics, as well as potential risk for movement side effects. Advised pt to contact office if movement side effects occur.    Discussed potential benefits, risks, and side effects of Depakote and need for period lab monitoring to assess for potential adverse effects to include obtaining CBC and LFTs.  There are no diagnoses linked to this encounter.   Please see After Visit Summary for patient specific instructions.  Future Appointments  Date Time Provider [place]  12/04/2022  3:40 PM Harrington Jobe, Berdie Ogren, NP CP-CP None  12/10/2022  2:15 PM CVD-[facility] COUMADIN [facility] None  12/31/2022  1:15 PM [place], Nena Alexander, DPM [facility]   03/04/2023 11:00 AM  Richardo Priest, MD [facility] None    No orders of the defined types were placed in this encounter.   -------------------------------

## 2022-12-10 ENCOUNTER — Ambulatory Visit: Payer: Medicare Other | Attending: Cardiology

## 2022-12-10 DIAGNOSIS — Z7901 Long term (current) use of anticoagulants: Secondary | ICD-10-CM | POA: Diagnosis present

## 2022-12-10 DIAGNOSIS — G459 Transient cerebral ischemic attack, unspecified: Secondary | ICD-10-CM

## 2022-12-10 LAB — POCT INR: INR: 3.9 — AB (ref 2.0–3.0)

## 2022-12-10 NOTE — Patient Instructions (Signed)
Description   Hold today's dose and then resume taking 1 tablet daily EXCEPT 2 tablets on Tuesdays.  Stay consistent with greens each week  Recheck INR in 1 week.  Coumadin Clinic [phone]

## 2022-12-17 ENCOUNTER — Ambulatory Visit: Payer: Medicare Other | Attending: Cardiology

## 2022-12-17 DIAGNOSIS — G459 Transient cerebral ischemic attack, unspecified: Secondary | ICD-10-CM | POA: Insufficient documentation

## 2022-12-17 DIAGNOSIS — Z7901 Long term (current) use of anticoagulants: Secondary | ICD-10-CM | POA: Diagnosis present

## 2022-12-17 LAB — POCT INR: INR: 2.2 (ref 2.0–3.0)

## 2022-12-17 NOTE — Patient Instructions (Signed)
Description   Continue taking 1 tablet daily EXCEPT 2 tablets on Tuesdays.  Stay consistent with greens each week  Recheck INR in 3 weeks.  Coumadin Clinic [phone]

## 2022-12-21 ENCOUNTER — Other Ambulatory Visit: Payer: Self-pay | Admitting: Adult Health

## 2022-12-21 DIAGNOSIS — F319 Bipolar disorder, unspecified: Secondary | ICD-10-CM

## 2022-12-23 ENCOUNTER — Other Ambulatory Visit: Payer: Self-pay | Admitting: Adult Health

## 2022-12-23 DIAGNOSIS — F319 Bipolar disorder, unspecified: Secondary | ICD-10-CM

## 2022-12-31 ENCOUNTER — Ambulatory Visit: Payer: Medicare Other | Admitting: Podiatry

## 2023-01-02 ENCOUNTER — Other Ambulatory Visit: Payer: Self-pay | Admitting: Adult Health

## 2023-01-02 DIAGNOSIS — F319 Bipolar disorder, unspecified: Secondary | ICD-10-CM

## 2023-01-07 ENCOUNTER — Ambulatory Visit: Payer: Medicare Other | Attending: Cardiology

## 2023-01-07 ENCOUNTER — Other Ambulatory Visit: Payer: Self-pay | Admitting: Cardiology

## 2023-01-07 DIAGNOSIS — G459 Transient cerebral ischemic attack, unspecified: Secondary | ICD-10-CM | POA: Diagnosis present

## 2023-01-07 DIAGNOSIS — Z5181 Encounter for therapeutic drug level monitoring: Secondary | ICD-10-CM

## 2023-01-07 DIAGNOSIS — Z7901 Long term (current) use of anticoagulants: Secondary | ICD-10-CM

## 2023-01-07 LAB — POCT INR: INR: 3.6 — AB (ref 2.0–3.0)

## 2023-01-07 NOTE — Patient Instructions (Signed)
Description   HOLD today's dose and then START taking 1 tablet daily EXCEPT 1.5 tablets on Sundays.   Stay consistent with greens each week  Recheck INR in 2 weeks.  C[facility]in Clinic [phone]

## 2023-01-08 ENCOUNTER — Ambulatory Visit (INDEPENDENT_AMBULATORY_CARE_PROVIDER_SITE_OTHER): Payer: Medicare Other | Admitting: Adult Health

## 2023-01-08 ENCOUNTER — Encounter: Payer: Self-pay | Admitting: Adult Health

## 2023-01-08 DIAGNOSIS — F319 Bipolar disorder, unspecified: Secondary | ICD-10-CM | POA: Diagnosis not present

## 2023-01-08 MED ORDER — DIVALPROEX SODIUM ER 500 MG PO TB24: 500.0000 mg | ORAL_TABLET | Freq: Every day | ORAL | 2 refills | Status: DC

## 2023-01-08 MED ORDER — TRAZODONE HCL 50 MG PO TABS
ORAL_TABLET | ORAL | 2 refills | Status: DC
Start: 2023-01-08 — End: 2023-03-25

## 2023-01-08 MED ORDER — ARIPIPRAZOLE 10 MG PO TABS: 10.0000 mg | ORAL_TABLET | Freq: Every day | ORAL | 2 refills | Status: DC

## 2023-01-08 NOTE — Progress Notes (Signed)
[name] [id number] 1956-03-23 67 y.o.  Subjective:   Patient ID:  [name] is a 67 y.o. (DOB 1955/12/07) female.  Chief Complaint: No chief complaint on file.   HPI [name] presents to the office today for follow-up of Bipolar Disorder.  Accompanied by husband.  Describes mood today as "not the best". Pleasant. Denies tearfulness. Mood symptoms - reports depression and anxiety. Denies irritability. Reports some worry, rumination and over thinking. Denies mania. Mood is lower. Stating "I feel blah". Has not wanted to get out and do things. Only leaving the house for appointments. Feels like the Abilify and Depakote have been helpful, but is not where she wants to be. Willing to consider other options. Varying interest and motivation. Taking medications as prescribed - children ensuring she is taking medications daily.  Energy levels lower. Active, does not have a regular exercise routine with physical disabilities.  Does not enjoy usual interests and activities. Married. Lives with husband. Spending time with family. Appetite decreased. Weight loss - "some". Sleeps better some nights than others. Averages 5 hours of broken sleep. Denies daytime napping. Focus and concentration difficulties. Completing tasks. Managing aspects of household. Retired. Denies SI or HI.  Denies AH or VH. Denies self harm. Denies substance use.  Previous medication trials: Pristiq, Wellbutrin, Cymbalta, other medications    Flowsheet Row ED from 05/20/2022 in [facility] Emergency Department at [facility] ED to Hosp-Admission (Discharged) from 03/22/2021 in [facility] PROGRESSIVE CARE  C-SSRS RISK CATEGORY No Risk No Risk        Review of Systems:  Review of Systems  Musculoskeletal:  Negative for gait problem.  Neurological:  Negative for tremors.  Psychiatric/Behavioral:         Please refer to HPI    Medications: I have reviewed the patient's current  medications.  Current Outpatient Medications  Medication Sig Dispense Refill   traZODone (DESYREL) 50 MG tablet Take one to two tablets at bedtime for sleep. 60 tablet 2   acetaminophen (TYLENOL) 325 MG tablet Take 2 tablets (650 mg total) by mouth every 6 (six) hours as needed for mild pain or headache. 30 tablet    ARIPiprazole (ABILIFY) 10 MG tablet Take 1 tablet (10 mg total) by mouth daily. 30 tablet 2   camphor-menthol (SARNA) lotion Apply 1 application topically as needed for itching. 222 mL 0   divalproex (DEPAKOTE ER) 500 MG 24 hr tablet Take 1 tablet (500 mg total) by mouth at bedtime. 30 tablet 2   docusate sodium (COLACE) 100 MG capsule Take 200 mg by mouth daily.     Ferrous Sulfate Dried (HIGH POTENCY IRON) 65 MG TABS Take 1 tablet by mouth daily.     folic acid (FOLVITE) 1 MG tablet Take 1 tablet (1 mg total) by mouth daily.     levonorgestrel (MIRENA) 20 MCG/DAY IUD 1 each by Intrauterine route once.     metoprolol succinate (TOPROL-XL) 25 MG 24 hr tablet TAKE 2 TABLETS BY MOUTH IN THE MORNING AND 1 IN THE EVENING 270 tablet 0   oxybutynin (DITROPAN-XL) 5 MG 24 hr tablet Take 5 mg by mouth every evening.     potassium chloride (KLOR-CON) 10 MEQ tablet Take 10 mEq by mouth 3 (three) times daily.     pravastatin (PRAVACHOL) 40 MG tablet Take 1 tablet (40 mg total) by mouth daily at 6 PM. 30 tablet 0   senna-docusate (SENOKOT-S) 8.6-50 MG tablet Take 1 tablet by mouth  2 (two) times daily.     torsemide (DEMADEX) 20 MG tablet Take 1 tablet (20 mg total) by mouth daily. Please take torsemide 20 mg twice daily if weight greater than 210lbs. 90 tablet 3   vitamin B-12 1000 MCG tablet Take 1 tablet (1,000 mcg total) by mouth daily.     warfarin (COUMADIN) 1 MG tablet TAKE 1 TO 2 TABLETS BY MOUTH ONCE DAILY AS DIRECTED BY  COUMADIN  CLINIC 45 tablet 0   warfarin (COUMADIN) 1 MG tablet TAKE 1 TO 1 AND 1/2 TABLETS BY MOUTH ONCE DAILY AS DIRECTED BY COUMADIN CLINIC 100 tablet 0   No  current facility-administered medications for this visit.    Medication Side Effects: None  Allergies:  Allergies  Allergen Reactions   Vancomycin Itching    Patient can receive vancomycin with slower infusion and prn benadryl for itching  Patient can receive vancomycin with slower infusion and prn benadryl for itching     Patient can receive vancomycin with slower infusion and prn benadryl for itching    Past Medical History:  Diagnosis Date   Anemia 06/03/2019   Aortic stenosis 03/23/2013   Overview:  02/18/13 TTE EF >55%. Critical AS with mean Ao valve gradient of 82 mm Hg. No AI. No MR, PR, mild TR. Estimated RVSP 30 mm Hg.   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 Postmenopausal bleeding since for the past 2 years.  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 Last biopsy was in May of 2019 which showed weakly proliferative endometrium possibly a polyp.     Sleep apnea    TIA (transient  ischemic attack) 06/04/2019   Transient neurologic deficit 06/03/2019    Past Medical History, Surgical history, Social history, and Family history were reviewed and updated as appropriate.   Please see review of systems for further details on the patient's review from today.   Objective:   Physical Exam:  There were no vitals taken for this visit.  Physical Exam Constitutional:      General: She is not in acute distress. Musculoskeletal:        General: No deformity.  Neurological:     Mental Status: She is alert and oriented to person, place, and time.     Coordination: Coordination normal.  Psychiatric:        Attention and Perception: Attention and perception normal. She does not perceive auditory or visual hallucinations.        Mood and Affect: Mood normal. Mood is not anxious or  depressed. Affect is not labile, blunt, angry or inappropriate.        Speech: Speech normal.        Behavior: Behavior normal.        Thought Content: Thought content normal. Thought content is not paranoid or delusional. Thought content does not include homicidal or suicidal ideation. Thought content does not include homicidal or suicidal plan.        Cognition and Memory: Cognition and memory normal.        Judgment: Judgment normal.     Comments: Insight intact     Lab Review:     Component Value Date/Time   NA 139 08/07/2021 0000   K 4.4 08/07/2021 0000   CL 102 08/07/2021 0000   CO2 26 (A) 08/07/2021 0000   GLUCOSE 98 07/16/2021 1433   GLUCOSE 88 03/31/2021 0042   BUN 21 08/07/2021 0000   CREATININE 1.2 (A) 08/07/2021 0000   CREATININE 0.93 07/16/2021 1433   CALCIUM 9.6 08/07/2021 0000   PROT 8.4 (A) 08/07/2021 0000   ALBUMIN 4.4 08/07/2021 0000   ALBUMIN 3.9 07/16/2021 1433   AST 26 08/07/2021 0000   ALT 17 08/07/2021 0000   ALKPHOS 66 08/07/2021 0000   BILITOT 0.3 07/16/2021 1433   GFRNONAA >60 03/31/2021 0042   GFRAA 74 06/06/2020 0903       Component Value Date/Time    WBC 13.0 08/07/2021 0000   WBC 13.4 (H) 04/02/2021 0008   RBC 5.45 (A) 08/07/2021 0000   HGB 15.7 08/07/2021 0000   HGB 13.1 07/16/2021 1433   HCT 48 (A) 08/07/2021 0000   HCT 40.7 07/16/2021 1433   PLT 254 08/07/2021 0000   PLT 240 07/16/2021 1433   MCV 88 08/07/2021 0000   MCH 28.4 07/16/2021 1433   MCH 27.9 04/02/2021 0008   MCHC 32.2 07/16/2021 1433   MCHC 30.4 04/02/2021 0008   RDW 14.2 07/16/2021 1433   LYMPHSABS 2.0 03/31/2021 0042   MONOABS 0.9 03/31/2021 0042   EOSABS 0.8 (H) 03/31/2021 0042   BASOSABS 0.1 03/31/2021 0042    No results found for: "POCLITH", "LITHIUM"   No results found for: "PHENYTOIN", "PHENOBARB", "VALPROATE", "CBMZ"   .res Assessment: Plan:    Plan:  PDMP reviewed  Started in late December:  Abilify 10 mg daily - mood stabilization Depakote ER 500mg  daily - mood stabilization  Add Trazadone 50mg  - 1 to 2 at hs  Consider Wellbutrin for depression - will call in 2 weeks with update.  RTC 4 to 6 weeks  Time spent with patient was 15 minutes. Greater than 50% of face to face time with patient was spent on counseling and coordination of care.    Patient advised to contact office with any questions, adverse effects, or acute worsening in signs and symptoms.  Discussed potential metabolic side effects associated with atypical antipsychotics, as well as potential risk for movement side effects. Advised pt to contact office if movement side effects occur.    Discussed potential benefits, risks, and side effects of Depakote and need for period lab monitoring to assess for potential adverse effects to include obtaining CBC and LFTs.   Diagnoses and all orders for this visit:  Bipolar I disorder (HCC) -     ARIPiprazole (ABILIFY) 10 MG tablet; Take 1 tablet (10 mg total) by mouth daily. -     divalproex (DEPAKOTE ER) 500 MG 24 hr tablet; Take 1 tablet (500 mg total) by mouth at bedtime. -  traZODone (DESYREL) 50 MG tablet; Take one to two  tablets at bedtime for sleep.     Please see After Visit Summary for patient specific instructions.  Future Appointments  Date Time Provider Department Center  01/21/2023  2:15 PM [facility] None  03/04/2023 11:00 AM Munley, Iline Oven, MD [facility] None    No orders of the defined types were placed in this encounter.   -------------------------------

## 2023-01-08 NOTE — Telephone Encounter (Signed)
Warfarin 1mg  H/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  Last INR 01/07/23 Last OV 05/22/22

## 2023-01-21 ENCOUNTER — Ambulatory Visit: Payer: Medicare Other | Attending: Cardiology

## 2023-01-21 DIAGNOSIS — G459 Transient cerebral ischemic attack, unspecified: Secondary | ICD-10-CM | POA: Insufficient documentation

## 2023-01-21 DIAGNOSIS — Z7901 Long term (current) use of anticoagulants: Secondary | ICD-10-CM | POA: Insufficient documentation

## 2023-01-21 LAB — POCT INR: INR: 3 (ref 2.0–3.0)

## 2023-01-21 NOTE — Patient Instructions (Signed)
Description   Continue taking 1 tablet daily EXCEPT 1.5 tablets on Sundays.   Stay consistent with greens each week  Recheck INR in 3 weeks.  [facility] [phone]

## 2023-01-22 ENCOUNTER — Telehealth: Payer: Self-pay | Admitting: Adult Health

## 2023-01-22 NOTE — Telephone Encounter (Signed)
Pt Lvm @ 3:09p. She said to advise [name] that the Trazadone is helping her to sleep.  No upcoming appts scheduled

## 2023-01-22 NOTE — Telephone Encounter (Signed)
Had just talked to the patient and she said 100 mg of trazodone was helping her sleep better, but it was consistent. She still reported no motivation. Have discussed with [name].

## 2023-02-11 ENCOUNTER — Ambulatory Visit: Payer: Medicare Other | Attending: Cardiology

## 2023-02-11 DIAGNOSIS — G459 Transient cerebral ischemic attack, unspecified: Secondary | ICD-10-CM | POA: Insufficient documentation

## 2023-02-11 DIAGNOSIS — Z7901 Long term (current) use of anticoagulants: Secondary | ICD-10-CM

## 2023-02-11 DIAGNOSIS — Z954 Presence of other heart-valve replacement: Secondary | ICD-10-CM

## 2023-02-11 LAB — POCT INR: INR: 2.7 (ref 2.0–3.0)

## 2023-02-11 NOTE — Patient Instructions (Signed)
Continue taking 1 tablet daily EXCEPT 1.5 tablets on Sundays.   Stay consistent with greens each week  Recheck INR in 3 weeks.  [facility] [phone]

## 2023-03-03 NOTE — Progress Notes (Signed)
Cardiology Office Note:    Date:  03/04/2023   ID:  [name], DOB 1956-07-08, MRN [id number]  PCP:  Noni Saupe, MD  Cardiologist:  Norman Herrlich, MD    Referring MD: Noni Saupe, MD    ASSESSMENT:    1. H/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   2. Long term (current) use of anticoagulants   3. Hypertensive heart disease with heart failure (HCC)   4. Iron deficiency anemia, unspecified iron deficiency anemia type    PLAN:    In order of problems listed above:  Overall [name] is doing better she is on long-term anticoagulation with valve leaflet thrombosis is not having signs or symptoms of heart failure and has no further GU bleeding Blood pressure is well-controlled fitted for weight loss I think she has been continue her current loop diuretic and beta-blocker She will recheck a CBC last hemoglobin was normal She will continue her statin we will check a lipid profile  Next appointment: 6 months   Medication Adjustments/Labs and Tests Ordered: Current medicines are reviewed at length with the patient today.  Concerns regarding medicines are outlined above.  No orders of the defined types were placed in this encounter.  No orders of the defined types were placed in this encounter.    History of Present Illness:    [name] is a 67 y.o. 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  Compliance with diet, lifestyle and medications: Yes  Her PCP relocated to [facility] she is waiting to have a visit with a new physician in the practice her last labs were in January 09/16/2022 with  a hemoglobin of 12.5 right main 0.9 potassium last lipid profile last summer in August with a cholesterol of 180 LDL 106 triglycerides 215 HDL 43  She is lost in excess of 30 pounds feels markedly improved and her legs are better she has almost complete healing of venous ulcer on the left leg and has no edema She feels fatigued but is not having shortness of breath orthopnea chest pain palpitation or syncope No bleeding from her anticoagulant Past Medical History:  Diagnosis Date   Anemia 06/03/2019   Aortic stenosis 03/23/2013   Overview:  02/18/13 TTE EF >55%. Critical AS with mean Ao valve gradient of 82 mm Hg. No AI. No MR, PR, mild TR. Estimated RVSP 30 mm Hg.   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 Postmenopausal bleeding since for the past 2 years.  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 Last biopsy was in May of 2019 which showed weakly proliferative endometrium possibly a polyp.     Sleep  apnea    TIA (transient ischemic attack) 06/04/2019   Transient neurologic deficit 06/03/2019    Past Surgical History:  Procedure Laterality Date   BUBBLE STUDY  06/07/2019   Procedure: BUBBLE STUDY;  Surgeon: Jodelle Red, MD;  Location: [facility] ENDOSCOPY;  Service: Cardiovascular;;   CHOLECYSTECTOMY  1983   RIGHT/LEFT HEART CATH AND CORONARY ANGIOGRAPHY N/A 04/01/2019   Procedure: RIGHT/LEFT HEART CATH AND CORONARY ANGIOGRAPHY;  Surgeon: Runell Gess, MD;  Location: [facility] INVASIVE CV LAB;  Service: Cardiovascular;  Laterality: N/A;   TEE WITHOUT CARDIOVERSION N/A 07/01/2018   Procedure: TRANSESOPHAGEAL ECHOCARDIOGRAM (TEE);  Surgeon: Jodelle Red, MD;  Location: [facility] ENDOSCOPY;  Service: Cardiovascular;  Laterality: N/A;   TEE WITHOUT CARDIOVERSION N/A 06/07/2019   Procedure: TRANSESOPHAGEAL ECHOCARDIOGRAM (TEE);  Surgeon: Jodelle Red, MD;  Location: [facility] ENDOSCOPY;  Service: Cardiovascular;  Laterality: N/A;   TISSUE AORTIC VALVE REPLACEMENT      Current Medications: No outpatient medications have been marked as taking for the 03/04/23 encounter (Office Visit) with Baldo Daub, MD.     Allergies:   Vancomycin   Social History   Socioeconomic History   Marital status: Married    Spouse name: Not on file   Number of children: Not on file   Years of education: Not on file   Highest education level: Not on file  Occupational History   Not on file  Tobacco Use   Smoking status: Never   Smokeless tobacco: Never  Vaping Use   Vaping Use: Never used  Substance and Sexual Activity   Alcohol use: No   Drug use: No   Sexual activity: Not Currently  Other Topics Concern   Not on file  Social History Narrative   Not on file   Social Determinants of Health   Financial Resource Strain: Not on file  Food Insecurity: Not on file  Transportation Needs: Not on file  Physical Activity: Not on file  Stress: Not on file  Social Connections: Not on file        EKGs/Labs/Other Studies Reviewed:    The following studies were reviewed today:  Cardiac Studies & Procedures   CARDIAC CATHETERIZATION  CARDIAC CATHETERIZATION 04/01/2019  Narrative Images from the original result were not included.   Hemodynamic findings consistent with aortic valve stenosis.  DEZERAY MARCIEL is a 67 y.o. female   [id number] LOCATION:  FACILITY: [facility] PHYSICIAN: Nanetta Batty, M.D. 1956/05/21   DATE OF PROCEDURE:  04/01/2019  DATE OF DISCHARGE:     CARDIAC CATHETERIZATION    History obtained from chart review.67 y.o. female with hx of AS (s/p AVR), HTN, morbid obesity, OSA, lymphedema and LE ulcers who presents for chest pain/chest pressure.  Her troponins were low and flat.  LV function was normal by 2D echo.  She presents now for right and left heart cath to define her anatomy and physiology.  Impression Ms. Holford has normal coronary arteries and mildly elevated filling pressures.  I do not think her chest pain is ischemically mediated.  Her enzymes are low and flat.  It could be  related to demand ischemia.  Continue medical therapy will be recommended.  The sheaths were removed and a TR band was placed on the right wrist to achieve patent hemostasis.  The patient left lab in stable condition.  The findings were reviewed with Dr. Dietrich Pates.  Nanetta Batty. MD, [facility] 04/01/2019 5:46 PM  Findings Coronary Findings Diagnostic  Dominance: Right  No diagnostic findings have been documented. Intervention  No interventions have been documented.     ECHOCARDIOGRAM  ECHOCARDIOGRAM COMPLETE 06/04/2019  Narrative ECHOCARDIOGRAM REPORT    Patient Name:   AVARY BESSIRE Date of Exam: 06/04/2019 Medical Rec #:  [id number]      Height:       62.0 in Accession #:    [id number]     Weight:       281.3 lb Date of Birth:  [birth year]       BSA:          2.21 m Patient Age:    63 years       BP:           121/57 mmHg Patient Gender: F              HR:            86 bpm. Exam Location:  Inpatient  Procedure: 2D Echo  Indications:    TIA 435.9  History:        Patient has prior history of Echocardiogram examinations, most recent 03/31/2019. CHF; CAD Risk Factors:Sleep Apnea and Hypertension. Aortic Valve: A  Sonographer:    Delcie Roch Referring Phys: [id number] TIMOTHY S OPYD  IMPRESSIONS   1. Left ventricular ejection fraction, by visual estimation, is >75%. The left ventricle has hyperdynamic function. Normal left ventricular size. There is mildly increased left ventricular hypertrophy. 2. Left ventricular diastolic Doppler parameters are consistent with pseudonormalization pattern of LV diastolic filling. 3. Global right ventricle has normal systolic function.The right ventricular size is normal. No increase in right ventricular wall thickness. 4. Left atrial size was mildly dilated. 5. Mild to moderate mitral annular calcification. 6. The tricuspid valve is grossly normal. Tricuspid valve regurgitation moderate-severe. 7. The aortic valve was not well visualized 8. AV is thickened, calcified with restricted motion. Peak and mean gradients through the valve are 43 and 25 mm Hg respectively consistent with mild to moderate AS. Since echo in July 2020, NSC in mean gradient.  FINDINGS Left Ventricle: Left ventricular ejection fraction, by visual estimation, is >75%. The left ventricle has hyperdynamic function. There is mildly increased left ventricular hypertrophy. Normal left ventricular size. Spectral Doppler shows Left ventricular diastolic Doppler parameters are consistent with pseudonormalization pattern of LV diastolic filling.  Right Ventricle: The right ventricular size is normal. No increase in right ventricular wall thickness. Global RV systolic function is has normal systolic function. The tricuspid regurgitant velocity is 3.12 m/s, and with an assumed right atrial pressure of 3 mmHg, the estimated right ventricular  systolic pressure is moderately elevated at 41.9 mmHg.  Left Atrium: Left atrial size was mildly dilated.  Right Atrium: Right atrial size was normal in size  Pericardium: There is no evidence of pericardial effusion.  Mitral Valve: The mitral valve is abnormal. There is mild thickening of the mitral valve leaflet(s). Mild to moderate mitral annular calcification. No evidence of mitral valve regurgitation.  Tricuspid Valve: The tricuspid valve is grossly normal. Tricuspid valve regurgitation moderate-severe by color flow Doppler.  Aortic Valve: The aortic valve was not well visualized.  Aortic valve regurgitation was not visualized by color flow Doppler. Aortic valve mean gradient measures 25.5 mmHg. Aortic valve peak gradient measures 43.4 mmHg. Aortic valve area, by VTI measures 1.25 cm. AV is thickened, calcified with restricted motion. Peak and mean gradients through the valve are 43 and 25 mm Hg respectively consistent with mild to moderate AS. Since echo in July 2020, NSC in mean gradient.  Pulmonic Valve: The pulmonic valve was not well visualized. Pulmonic valve regurgitation is not visualized by color flow Doppler.  Aorta: The aortic root is normal in size and structure.  IAS/Shunts: The interatrial septum was not assessed.    LEFT VENTRICLE PLAX 2D LVIDd:         4.50 cm  Diastology LVIDs:         2.40 cm  LV e' lateral:   9.68 cm/s LV PW:         1.20 cm  LV E/e' lateral: 14.5 LV IVS:        1.10 cm  LV e' medial:    8.05 cm/s LVOT diam:     1.70 cm  LV E/e' medial:  17.4 LV SV:         72 ml LV SV Index:   29.55 LVOT Area:     2.27 cm   RIGHT VENTRICLE RV S prime:     17.70 cm/s TAPSE (M-mode): 2.1 cm  LEFT ATRIUM             Index       RIGHT ATRIUM           Index LA diam:        4.30 cm 1.95 cm/m  RA Area:     15.30 cm LA Vol (A2C):   70.1 ml 31.72 ml/m RA Volume:   32.10 ml  14.53 ml/m LA Vol (A4C):   57.4 ml 25.97 ml/m LA Biplane Vol: 64.9 ml 29.37  ml/m AORTIC VALVE AV Area (Vmax):    1.20 cm AV Area (Vmean):   1.19 cm AV Area (VTI):     1.25 cm AV Vmax:           329.50 cm/s AV Vmean:          238.000 cm/s AV VTI:            0.645 m AV Peak Grad:      43.4 mmHg AV Mean Grad:      25.5 mmHg LVOT Vmax:         173.50 cm/s LVOT Vmean:        124.500 cm/s LVOT VTI:          0.355 m LVOT/AV VTI ratio: 0.55  AORTA Ao Root diam: 3.50 cm Ao Asc diam:  3.10 cm  MITRAL VALVE                         TRICUSPID VALVE MV Area (PHT): 3.60 cm              TR Peak grad:   38.9 mmHg MV PHT:        61.19 msec            TR Vmax:        312.00 cm/s MV Decel Time: 211 msec MV E velocity: 140.00 cm/s 103 cm/s  SHUNTS MV A velocity: 88.10 cm/s  70.3 cm/s Systemic VTI:  0.35 m MV E/A ratio:  1.59        1.5  Systemic Diam: 1.70 cm   Dietrich Pates MD Electronically signed by Dietrich Pates MD Signature Date/Time: 06/04/2019/3:31:10 PM    Final   TEE  ECHO TEE 06/07/2019  Narrative TRANSESOPHOGEAL ECHO REPORT    Patient Name:   ORTENSIA MOTZER Date of Exam: 06/07/2019 Medical Rec #:  [id number]      Height:       62.0 in Accession #:    [id number]     Weight:       278.0 lb Date of Birth:  [birth year]       BSA:          2.20 m Patient Age:    63 years       BP:           114/50 mmHg Patient Gender: F              HR:           92 bpm. Exam Location:  Inpatient   Procedure: Transesophageal Echo, Saline Contrast Bubble Study, Cardiac Doppler and Color Doppler  Indications:     TIA  History:         Patient has prior history of Echocardiogram examinations, most recent 06/04/2019. CHF, CAD; Aortic stenosis Risk Factors:Hypertension, Sleep Apnea and Obesity. Aortic Valve: A 23mm Hancock bioprosthetic H/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.  Sonographer:     Tobin Chad Referring Phys:  [id number] MIHAI CROITORU Diagnosing Phys: Jodelle Red MD    PROCEDURE: After discussion of the risks and benefits of a TEE,  an informed consent was obtained from the patient. Patients was monitored while under deep sedation. The transesophogeal probe was passed through the esophogus of the patient. Imaged were obtained with the patient in a left lateral decubitus position. Image quality was excellent. The patient's vital signs; including heart rate, blood pressure, and oxygen saturation; remained stable throughout the procedure. The patient developed no complications during the procedure.  IMPRESSIONS   1. Normal appearance of bioprosthetic aortic valve. Gradients consistent with prosthetic valve size. On this study, accelerated flow seen again across LVOT. As she received MAC with propofol (vs. Conscious sedation on last TEE), overall LVOT gradient appeared lower than prior study. No evidence of thrombus. She did have a small PFO again visualized, and her bubble study was rapidly positive with inspiration. She snored loudly throughout procedure and needed jaw thrust/blowby O2 in addition to nasal cannula to maintain saturations. Right sided pressures also moderately elevated. 2. Left ventricular ejection fraction, by visual estimation, is 65 to 70%. The left ventricle has hyperdynamic function. There is severely increased left ventricular hypertrophy. 3. Left ventricular diastolic function could not be evaluated pattern of LV diastolic filling. 4. Global right ventricle has normal systolic function.The right ventricular size is normal. No increase in right ventricular wall thickness. 5. Left atrial size was not assessed. 6. Right atrial size was not assessed. 7. Mild to moderate mitral annular calcification. 8. The mitral valve is normal in structure. Mild mitral valve regurgitation. 9. The tricuspid valve is normal in structure. Tricuspid valve regurgitation moderate-severe. 10. Aortic valve mean gradient measures 18.0 mmHg. 11. Aortic valve regurgitation was not visualized by color flow Doppler. Mild aortic valve  stenosis. 12. The pulmonic valve was grossly normal. Pulmonic valve regurgitation is trivial by color flow Doppler. 13. Mild plaque invoving the descending aorta. 14. Moderately elevated pulmonary artery systolic pressure. 15. Small patent foramen ovale with predominantly left to right shunting across the atrial septum. 16. Evidence  of atrial level shunting detected by color flow Doppler.  FINDINGS Left Ventricle: Left ventricular ejection fraction, by visual estimation, is 65 to 70%. The left ventricle has hyperdynamic function. There is severely increased left ventricular hypertrophy. Septal left ventricular hypertrophy. Spectral Doppler shows Left ventricular diastolic function could not be evaluated pattern of LV diastolic filling.  Right Ventricle: The right ventricular size is normal. No increase in right ventricular wall thickness. Global RV systolic function is has normal systolic function. The tricuspid regurgitant velocity is 2.95 m/s, and with an assumed right atrial pressure of 8 mmHg, the estimated right ventricular systolic pressure is moderately elevated at 42.8 mmHg.  Left Atrium: Left atrial size was not assessed.  Right Atrium: Right atrial size was not assessed  Pericardium: There is no evidence of pericardial effusion.  Mitral Valve: The mitral valve is normal in structure. Mild to moderate mitral annular calcification. Mild mitral valve regurgitation.  Tricuspid Valve: The tricuspid valve is normal in structure. Tricuspid valve regurgitation moderate-severe by color flow Doppler.  Aortic Valve: The aortic valve has been repaired/replaced. Aortic valve regurgitation was not visualized by color flow Doppler. Mild aortic stenosis is present. Aortic valve mean gradient measures 18.0 mmHg. Aortic valve peak gradient measures 32.9 mmHg. 23mm Hancock bioprosthetic valve is present in the aortic position. Echo findings shows no evidence of rocking, vegetation, thrombus/mass,  dehiscence, stenosis, regurgitation, perivalvular leak or abscess of the aortic prosthesis.  Pulmonic Valve: The pulmonic valve was grossly normal. Pulmonic valve regurgitation is trivial by color flow Doppler.  Aorta: The aortic root and ascending aorta are structurally normal, with no evidence of dilitation. There is mild plaque involving the descending aorta.  Shunts: Agitated saline contrast was given intravenously to evaluate for intracardiac shunting. Saline contrast bubble study was positive, with evidence of interatrial shunting. A small patent foramen ovale is detected with predominantly left to right shunting across the atrial septum. Evidence of atrial level shunting detected by color flow Doppler.   AORTIC VALVE                    Normals AV Vmax:           287.00 cm/s AV Vmean:          193.000 cm/s 77 cm/s AV VTI:            0.491 m      3.15 cm2 AV Peak Grad:      32.9 mmHg AV Mean Grad:      18.0 mmHg    3 mmHg LVOT Vmax:         158.00 cm/s LVOT Vmean:        104.000 cm/s 75 cm/s LVOT VTI:          0.272 m      25.3 cm LVOT/AV VTI ratio: 0.55         1  TRICUSPID VALVE             Normals TR Peak grad:   34.8 mmHg TR Vmax:        295.00 cm/s 288 cm/s  SHUNTS Systemic VTI: 0.27 m   Jodelle Red MD Electronically signed by Jodelle Red MD Signature Date/Time: 06/07/2019/4:32:52 PM    Final   MONITORS  CARDIAC EVENT MONITOR 08/19/2019  Narrative Patient utilized event monitor for 30 days beginning 06/29/2019 completing 07/28/2019.  The rhythm throughout was sinus the average rate was 99 and there were no episodes of bradycardia or pauses of 3  seconds or greater.  There was 1 stable event sinus tachycardia.  There were no symptomatic events.  There were no episodes of atrial fibrillation or flutter or SVT.  No ventricular ectopy was noted.   Conclusion normal 30-day event monitor.   CT SCANS  CT CORONARY MORPH W/CTA COR W/SCORE  05/22/2019  Addendum 05/22/2019 10:24 PM ADDENDUM REPORT: 05/22/2019 22:21  CLINICAL DATA:  67 year old female with h/o AVR with a 23 mm Hancock valve on 03/30/13, now with increased gradients. Coronary angiography in July 2020 showed no significant CAD and 43 mm peak to peak gradient across the valve and echocardiogram suggestive of AVR dysfunction.  EXAM: Cardiac TAVR CT  TECHNIQUE: The patient was scanned on a [employer]. A 120 kV retrospective scan was triggered in the descending thoracic aorta at 111 HU's. Gantry rotation speed was 250 msecs and collimation was .6 mm. No beta blockade or nitro were given. The 3D data set was reconstructed in 5% intervals of the R-R cycle. Systolic and diastolic phases were analyzed on a dedicated work station using MPR, MIP and VRT modes. The patient received 80 cc of contrast.  FINDINGS: Aortic Valve: 23 mm Hancock valve is seen in the aortic position. Annular ring measures 23 x 23 mm. There is no abnormal valve motion. No dehiscence/perivalvular leak. Leaflet visualization is significantly limited secondary to patient's size (BMI 52). There are two hypo-attenuated areas under the leaflets that possibly represent leaflets thrombosis.  Aorta: There is ascending aortic aneurysm with maximum diameter 42 mm. Normal size of the aortic arch and descending aorta. Minimal atherosclerotic plaque, no dissection.  Sinotubular Junction: 33 x 33 mm  Ascending Thoracic Aorta: 42 x 41 mm  Aortic Arch: 25 x 23 mm  Descending Thoracic Aorta: 23 x 23 mm  No thrombus in the left atrial appendage.  Normal size of the pulmonary artery.  IMPRESSION: 1. 23 mm Hancock valve is seen in the aortic position with normal size of the annular ring (23 mm). There is no abnormal valve motion, no dehiscence/perivalvular leak. Unfortunately, secondary to patient's size (BMI 52) and beam harding artifact leaflet visualization is very limited and leaflet  opening is not visualized. There are two hypo-attenuated areas under the leaflets that possibly represent leaflets thrombosis.  In the presence of symptoms consider TEE for further evaluation.  2. Ascending aortic aneurysm with maximum diameter 42 mm. Annual chest CTA or MRA is recommended.   Electronically Signed By: Tobias Alexander On: 05/22/2019 22:21  Narrative EXAM: OVER-READ INTERPRETATION  CT CHEST  The following report is an over-read performed by radiologist Dr. Trudie Reed of [facility] on 05/20/2019. This over-read does not include interpretation of cardiac or coronary anatomy or pathology. The coronary calcium score/coronary CTA interpretation by the cardiologist is attached.  COMPARISON:  Chest CTA 03/30/2019.  FINDINGS: Extracardiac findings will be described separately under dictation for contemporaneously obtained CTA chest, abdomen and pelvis.  IMPRESSION: Please see separate dictation for contemporaneously obtained CTA chest, abdomen and pelvis dated 05/20/2019 for full description of relevant extracardiac findings.  Electronically Signed: By: Trudie Reed M.D. On: 05/20/2019 14:28              Recent Labs: No results found for requested labs within last 365 days.  Recent Lipid Panel    Component Value Date/Time   CHOL 176 07/16/2021 1433   TRIG 160 (H) 07/16/2021 1433   HDL 42 07/16/2021 1433   CHOLHDL 4.2 07/16/2021 1433   CHOLHDL 4.1  06/04/2019 0507   VLDL 22 06/04/2019 0507   LDLCALC 106 (H) 07/16/2021 1433    Physical Exam:    VS:  BP 134/76 (BP Location: Left Arm, Patient Position: Sitting)   Pulse (!) 48   Ht 5\' 1"  (1.549 m)   Wt 212 lb 6.4 oz (96.3 kg)   SpO2 94%   BMI 40.13 kg/m     Wt Readings from Last 3 Encounters:  03/04/23 212 lb 6.4 oz (96.3 kg)  05/22/22 243 lb 3.2 oz (110.3 kg)  08/07/21 217 lb 9.6 oz (98.7 kg)     GEN:  Well nourished, well developed in no acute distress HEENT:  Normal NECK: No JVD; No carotid bruits LYMPHATICS: No lymphadenopathy CARDIAC: RRR, no murmurs, rubs, gallops RESPIRATORY:  Clear to auscultation without rales, wheezing or rhonchi  ABDOMEN: Soft, non-tender, non-distended MUSCULOSKELETAL:  No edema; No deformity  SKIN: Warm and dry NEUROLOGIC:  Alert and oriented x 3 PSYCHIATRIC:  Normal affect    Signed, Norman Herrlich, MD  03/04/2023 10:42 AM    [facility]

## 2023-03-04 ENCOUNTER — Ambulatory Visit (INDEPENDENT_AMBULATORY_CARE_PROVIDER_SITE_OTHER): Payer: Medicare Other

## 2023-03-04 ENCOUNTER — Ambulatory Visit: Payer: Medicare Other | Attending: Cardiology | Admitting: Cardiology

## 2023-03-04 VITALS — BP 134/76 | HR 48 | Ht 61.0 in | Wt 212.4 lb

## 2023-03-04 DIAGNOSIS — Z7901 Long term (current) use of anticoagulants: Secondary | ICD-10-CM | POA: Insufficient documentation

## 2023-03-04 DIAGNOSIS — Z954 Presence of other heart-valve replacement: Secondary | ICD-10-CM | POA: Diagnosis not present

## 2023-03-04 DIAGNOSIS — D509 Iron deficiency anemia, unspecified: Secondary | ICD-10-CM | POA: Insufficient documentation

## 2023-03-04 DIAGNOSIS — G459 Transient cerebral ischemic attack, unspecified: Secondary | ICD-10-CM

## 2023-03-04 DIAGNOSIS — I110 Hypertensive heart disease with heart failure: Secondary | ICD-10-CM | POA: Insufficient documentation

## 2023-03-04 LAB — POCT INR: INR: 2.6 (ref 2.0–3.0)

## 2023-03-04 NOTE — Addendum Note (Signed)
Addended by: Roxanne Mins I on: 03/04/2023 11:15 AM   Modules accepted: Orders

## 2023-03-04 NOTE — Patient Instructions (Signed)
Description   Continue taking 1 tablet daily EXCEPT 1.5 tablets on Sundays.  Stay consistent with greens each week.  Recheck INR in 4 weeks [facility] #[phone]      

## 2023-03-04 NOTE — Patient Instructions (Signed)
Medication Instructions:  Your physician recommends that you continue on your current medications as directed. Please refer to the Current Medication list given to you today.  *If you need a refill on your cardiac medications before your next appointment, please call your pharmacy*   Lab Work: Your physician recommends that you return for lab work in:   Labs today: CBC, CMP, Lipids, Pro BNP  If you have labs (blood work) drawn today and your tests are completely normal, you will receive your results only by: MyChart Message (if you have MyChart) OR A paper copy in the mail If you have any lab test that is abnormal or we need to change your treatment, we will call you to review the results.   Testing/Procedures: None   Follow-Up: At [facility], you and your health needs are our priority.  As part of our continuing mission to provide you with exceptional heart care, we have created designated Provider Care Teams.  These Care Teams include your primary Cardiologist (physician) and Advanced Practice Providers (APPs -  Physician Assistants and Nurse Practitioners) who all work together to provide you with the care you need, when you need it.  We recommend signing up for the patient portal called "MyChart".  Sign up information is provided on this After Visit Summary.  MyChart is used to connect with patients for Virtual Visits (Telemedicine).  Patients are able to view lab/test results, encounter notes, upcoming appointments, etc.  Non-urgent messages can be sent to your provider as well.   To learn more about what you can do with MyChart, go to [email or web address].    Your next appointment:   6 month(s)  Provider:   Norman Herrlich, MD    Other Instructions None

## 2023-03-05 LAB — CBC
Hematocrit: 45.2 % (ref 34.0–46.6)
Hemoglobin: 14.4 g/dL (ref 11.1–15.9)
MCH: 30.2 pg (ref 26.6–33.0)
MCHC: 31.9 g/dL (ref 31.5–35.7)
MCV: 95 fL (ref 79–97)
Platelets: 181 10*3/uL (ref 150–450)
RBC: 4.77 x10E6/uL (ref 3.77–5.28)
RDW: 14 % (ref 11.7–15.4)
WBC: 9.8 10*3/uL (ref 3.4–10.8)

## 2023-03-05 LAB — COMPREHENSIVE METABOLIC PANEL
ALT: 9 IU/L (ref 0–32)
AST: 14 IU/L (ref 0–40)
Albumin: 4.2 g/dL (ref 3.9–4.9)
Alkaline Phosphatase: 54 IU/L (ref 44–121)
BUN/Creatinine Ratio: 11 — ABNORMAL LOW (ref 12–28)
BUN: 12 mg/dL (ref 8–27)
Bilirubin Total: 0.4 mg/dL (ref 0.0–1.2)
CO2: 25 mmol/L (ref 20–29)
Calcium: 9.8 mg/dL (ref 8.7–10.3)
Chloride: 100 mmol/L (ref 96–106)
Creatinine, Ser: 1.05 mg/dL — ABNORMAL HIGH (ref 0.57–1.00)
Globulin, Total: 2.6 g/dL (ref 1.5–4.5)
Glucose: 103 mg/dL — ABNORMAL HIGH (ref 70–99)
Potassium: 4.2 mmol/L (ref 3.5–5.2)
Sodium: 140 mmol/L (ref 134–144)
Total Protein: 6.8 g/dL (ref 6.0–8.5)
eGFR: 58 mL/min/{1.73_m2} — ABNORMAL LOW (ref >59)

## 2023-03-05 LAB — LIPID PANEL
Chol/HDL Ratio: 4.8 ratio — ABNORMAL HIGH (ref 0.0–4.4)
Cholesterol, Total: 201 mg/dL — ABNORMAL HIGH (ref 100–199)
HDL: 42 mg/dL (ref >39)
LDL Chol Calc (NIH): 108 mg/dL — ABNORMAL HIGH (ref 0–99)
Triglycerides: 297 mg/dL — ABNORMAL HIGH (ref 0–149)
VLDL Cholesterol Cal: 51 mg/dL — ABNORMAL HIGH (ref 5–40)

## 2023-03-05 LAB — PRO B NATRIURETIC PEPTIDE: NT-Pro BNP: 1850 pg/mL — ABNORMAL HIGH (ref 0–301)

## 2023-03-07 ENCOUNTER — Other Ambulatory Visit: Payer: Self-pay | Admitting: Cardiology

## 2023-03-25 ENCOUNTER — Encounter: Payer: Self-pay | Admitting: Adult Health

## 2023-03-25 ENCOUNTER — Ambulatory Visit (INDEPENDENT_AMBULATORY_CARE_PROVIDER_SITE_OTHER): Payer: Medicare Other | Admitting: Adult Health

## 2023-03-25 DIAGNOSIS — F319 Bipolar disorder, unspecified: Secondary | ICD-10-CM | POA: Diagnosis not present

## 2023-03-25 MED ORDER — TRAZODONE HCL 50 MG PO TABS
ORAL_TABLET | ORAL | 2 refills | Status: DC
Start: 2023-03-25 — End: 2023-05-20

## 2023-03-25 MED ORDER — ARIPIPRAZOLE 10 MG PO TABS
10.0000 mg | ORAL_TABLET | Freq: Every day | ORAL | 2 refills | Status: DC
Start: 2023-03-25 — End: 2023-05-20

## 2023-03-25 MED ORDER — BUPROPION HCL ER (XL) 150 MG PO TB24: 150.0000 mg | ORAL_TABLET | Freq: Every day | ORAL | 2 refills | Status: DC

## 2023-03-25 MED ORDER — DIVALPROEX SODIUM ER 500 MG PO TB24
500.0000 mg | ORAL_TABLET | Freq: Every day | ORAL | 2 refills | Status: DC
Start: 2023-03-25 — End: 2023-05-20

## 2023-03-25 NOTE — Progress Notes (Signed)
[name] [id number] [birth year] 67 y.o.  Subjective:   Patient ID:  [name] is a 67 y.o. (DOB 1956-07-23) female.  Chief Complaint: No chief complaint on file.   HPI [name] presents to the office today for follow-up of Bipolar Disorder.  Accompanied by husband.  Describes mood today as "not a lot better". Pleasant. Denies tearfulness. Mood symptoms - reports depression - sadness. Reports anxiety at times. Denies irritability. Reports some worry. Denies rumination and over thinking. Denies mania. Mood is lower. Stating "I feel like I'm doing about the same as I was with the exception of sleeping better". Getting out of the house some. Feels like medications are helpful, but she is still not where she wants to. Willing to consider other options. Decreased interest and motivation. Taking medications as prescribed - children ensuring she is taking medications daily.  Energy levels lower. Active, does not have a regular exercise routine with physical disabilities.  Does not enjoy usual interests and activities. Married. Lives with husband. Spending time with family. Appetite decreased. Weight stable. Sleeps better some nights than others. Averages 6 to 7 hours. Reports daytime napping. Focus and concentration improving. Completing tasks. Managing minimal aspects of household. Retired. Denies SI or HI.  Denies AH or VH. Denies self harm. Denies substance use.  Previous medication trials: Pristiq, Wellbutrin, Cymbalta, other medications   Flowsheet Row ED from 05/20/2022 in [facility] Emergency Department at [facility] ED to Hosp-Admission (Discharged) from 03/22/2021 in [place] 2C CV PROGRESSIVE CARE  C-SSRS RISK CATEGORY No Risk No Risk        Review of Systems:  Review of Systems  Musculoskeletal:  Negative for gait problem.  Neurological:  Negative for tremors.  Psychiatric/Behavioral:         Please refer to HPI    Medications: I have reviewed the  patient's current medications.  Current Outpatient Medications  Medication Sig Dispense Refill   acetaminophen (TYLENOL) 325 MG tablet Take 2 tablets (650 mg total) by mouth every 6 (six) hours as needed for mild pain or headache. 30 tablet    ARIPiprazole (ABILIFY) 10 MG tablet Take 1 tablet (10 mg total) by mouth daily. 30 tablet 2   camphor-menthol (SARNA) lotion Apply 1 application topically as needed for itching. 222 mL 0   divalproex (DEPAKOTE ER) 500 MG 24 hr tablet Take 1 tablet (500 mg total) by mouth at bedtime. 30 tablet 2   docusate sodium (COLACE) 100 MG capsule Take 200 mg by mouth daily.     Ferrous Sulfate Dried (HIGH POTENCY IRON) 65 MG TABS Take 1 tablet by mouth daily.     folic acid (FOLVITE) 1 MG tablet Take 1 tablet (1 mg total) by mouth daily.     levonorgestrel (MIRENA) 20 MCG/DAY IUD 1 each by Intrauterine route once.     metoprolol succinate (TOPROL-XL) 25 MG 24 hr tablet TAKE 2 TABLETS BY MOUTH IN THE MORNING AND 1 IN THE EVENING 270 tablet 0   oxybutynin (DITROPAN-XL) 5 MG 24 hr tablet Take 5 mg by mouth every evening.     potassium chloride (KLOR-CON) 10 MEQ tablet Take 10 mEq by mouth 3 (three) times daily.     pravastatin (PRAVACHOL) 40 MG tablet Take 1 tablet (40 mg total) by mouth daily at 6 PM. 30 tablet 0   senna-docusate (SENOKOT-S) 8.6-50 MG tablet Take 1 tablet by mouth 2 (two) times daily.     torsemide (DEMADEX) 20 MG tablet Take  1 tablet (20 mg total) by mouth daily. Please take torsemide 20 mg twice daily if weight greater than 210lbs. 90 tablet 3   traZODone (DESYREL) 50 MG tablet Take one to two tablets at bedtime for sleep. 60 tablet 2   vitamin B-12 1000 MCG tablet Take 1 tablet (1,000 mcg total) by mouth daily.     warfarin (COUMADIN) 1 MG tablet TAKE 1 TO 2 TABLETS BY MOUTH ONCE DAILY AS DIRECTED BY  COUMADIN  CLINIC 45 tablet 0   warfarin (COUMADIN) 1 MG tablet TAKE 1 TO 1 AND 1/2 TABLETS BY MOUTH ONCE DAILY AS DIRECTED BY COUMADIN CLINIC 100  tablet 0   No current facility-administered medications for this visit.    Medication Side Effects: None  Allergies:  Allergies  Allergen Reactions   Vancomycin Itching    Patient can receive vancomycin with slower infusion and prn benadryl for itching  Patient can receive vancomycin with slower infusion and prn benadryl for itching     Patient can receive vancomycin with slower infusion and prn benadryl for itching    Past Medical History:  Diagnosis Date   Anemia 06/03/2019   Aortic stenosis 03/23/2013   Overview:  02/18/13 TTE EF >55%. Critical AS with mean Ao valve gradient of 82 mm Hg. No AI. No MR, PR, mild TR. Estimated RVSP 30 mm Hg.   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 Postmenopausal bleeding since for the past 2 years.  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 Last biopsy was in May of 2019 which showed weakly proliferative endometrium possibly a polyp.     Sleep apnea    TIA  (transient ischemic attack) 06/04/2019   Transient neurologic deficit 06/03/2019    Past Medical History, Surgical history, Social history, and Family history were reviewed and updated as appropriate.   Please see review of systems for further details on the patient's review from today.   Objective:   Physical Exam:  There were no vitals taken for this visit.  Physical Exam Constitutional:      General: She is not in acute distress. Musculoskeletal:        General: No deformity.  Neurological:     Mental Status: She is alert and oriented to person, place, and time.     Coordination: Coordination normal.  Psychiatric:        Attention and Perception: Attention and perception normal. She does not perceive auditory or visual hallucinations.        Mood and Affect: Affect is  not labile, blunt, angry or inappropriate.        Speech: Speech normal.        Behavior: Behavior normal.        Thought Content: Thought content normal. Thought content is not paranoid or delusional. Thought content does not include homicidal or suicidal ideation. Thought content does not include homicidal or suicidal plan.        Cognition and Memory: Cognition and memory normal.        Judgment: Judgment normal.     Comments: Insight intact     Lab Review:     Component Value Date/Time   NA 140 03/04/2023 1115   K 4.2 03/04/2023 1115   CL 100 03/04/2023 1115   CO2 25 03/04/2023 1115   GLUCOSE 103 (H) 03/04/2023 1115   GLUCOSE 88 03/31/2021 0042   BUN 12 03/04/2023 1115   CREATININE 1.05 (H) 03/04/2023 1115   CALCIUM 9.8 03/04/2023 1115   PROT 6.8 03/04/2023 1115   ALBUMIN 4.2 03/04/2023 1115   AST 14 03/04/2023 1115   ALT 9 03/04/2023 1115   ALKPHOS 54 03/04/2023 1115   BILITOT 0.4 03/04/2023 1115   GFRNONAA >60 03/31/2021 0042   GFRAA 74 06/06/2020 0903       Component Value Date/Time   WBC 9.8 03/04/2023 1115   WBC 13.4 (H) 04/02/2021 0008   RBC 4.77 03/04/2023 1115   RBC 5.45 (A)  08/07/2021 0000   HGB 14.4 03/04/2023 1115   HCT 45.2 03/04/2023 1115   PLT 181 03/04/2023 1115   MCV 95 03/04/2023 1115   MCV 88 08/07/2021 0000   MCH 30.2 03/04/2023 1115   MCH 27.9 04/02/2021 0008   MCHC 31.9 03/04/2023 1115   MCHC 30.4 04/02/2021 0008   RDW 14.0 03/04/2023 1115   LYMPHSABS 2.0 03/31/2021 0042   MONOABS 0.9 03/31/2021 0042   EOSABS 0.8 (H) 03/31/2021 0042   BASOSABS 0.1 03/31/2021 0042    No results found for: "POCLITH", "LITHIUM"   No results found for: "PHENYTOIN", "PHENOBARB", "VALPROATE", "CBMZ"   .res Assessment: Plan:   Plan:  PDMP reviewed  Started in late December:  Abilify 10 mg daily - mood stabilization Depakote ER 500mg  daily - mood stabilization Trazadone 50mg  - 1 to 2 at hs  Consider Wellbutrin for depression - will call in 2 weeks with update.  RTC 4 weeks  Time spent with patient was 15 minutes. Greater than 50% of face to face time with patient was spent on counseling and coordination of care.    Patient advised to contact office with any questions, adverse effects, or acute worsening in signs and symptoms.  Discussed potential metabolic side effects associated with atypical antipsychotics, as well as potential risk for movement side effects. Advised pt to contact office if movement side effects occur.    Discussed potential benefits, risks, and side effects of Depakote and need for period lab monitoring to assess for potential adverse effects to include obtaining CBC and LFTs.  There are no diagnoses linked to this encounter.   Please see After Visit Summary for patient specific instructions.  Future Appointments  Date Time Provider Department Center  03/25/2023  5:20 PM Ziah Leandro, Thereasa Solo, NP [facility] None  04/01/2023  1:30 PM [facility] None    No orders of the defined types were placed in this encounter.   -------------------------------

## 2023-04-01 ENCOUNTER — Ambulatory Visit: Payer: Medicare Other

## 2023-04-03 ENCOUNTER — Other Ambulatory Visit: Payer: Self-pay | Admitting: Cardiology

## 2023-04-03 DIAGNOSIS — Z5181 Encounter for therapeutic drug level monitoring: Secondary | ICD-10-CM

## 2023-04-03 NOTE — Telephone Encounter (Signed)
Warfarin 1mg  refill H/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  Last INR  03/04/23 Last OV 03/04/23

## 2023-04-08 ENCOUNTER — Ambulatory Visit: Payer: Medicare Other | Attending: Cardiology

## 2023-04-08 DIAGNOSIS — G459 Transient cerebral ischemic attack, unspecified: Secondary | ICD-10-CM | POA: Diagnosis not present

## 2023-04-08 DIAGNOSIS — Z7901 Long term (current) use of anticoagulants: Secondary | ICD-10-CM | POA: Insufficient documentation

## 2023-04-08 LAB — POCT INR: INR: 2.4 (ref 2.0–3.0)

## 2023-04-08 NOTE — Patient Instructions (Signed)
Description   Continue taking 1 tablet daily EXCEPT 1.5 tablets on Sundays.   Stay consistent with greens each week  Recheck INR in 5 weeks.  [facility] [phone]

## 2023-04-22 ENCOUNTER — Encounter: Payer: Self-pay | Admitting: Adult Health

## 2023-04-22 ENCOUNTER — Ambulatory Visit (INDEPENDENT_AMBULATORY_CARE_PROVIDER_SITE_OTHER): Payer: Medicare Other | Admitting: Adult Health

## 2023-04-22 DIAGNOSIS — F319 Bipolar disorder, unspecified: Secondary | ICD-10-CM

## 2023-04-22 MED ORDER — BUPROPION HCL ER (XL) 300 MG PO TB24
300.0000 mg | ORAL_TABLET | Freq: Every day | ORAL | 2 refills | Status: DC
Start: 2023-04-22 — End: 2023-05-20

## 2023-04-22 NOTE — Progress Notes (Signed)
[name] [id number] Jan 22, 1956 67 y.o.  Subjective:   Patient ID:  [name] is a 67 y.o. (DOB Apr 03, 1956) female.  Chief Complaint: No chief complaint on file.   HPI DIJONAE DURNIN presents to the office today for follow-up of Bipolar Disorder.  Accompanied by husband - seeing some improvement - feels like she is weak without activity over the past several months.  Describes mood today as "ok". Pleasant. Denies tearfulness. Mood symptoms - denies depression. Reports anxiety at times. Denies irritability. Reports some worry. Denies rumination and over thinking. Denies mania. Mood is lower. Stating "I feel like I'm doing better". Feels like the addition of Wellbutrin has been helpful - willing to increase dose. Getting out of the house some. Improved interest and motivation. Taking medications as prescribed. Energy levels lower. Active, does not have a regular exercise routine - starting a silver sneakers program at the [facility]. Does not enjoy usual interests and activities. Married. Lives with husband. Spending time with family. Appetite adequate. Weight stable. Sleeps well most nights. Averages 6 to 7 hours of broken sleep during the night. Reports daytime napping. Focus and concentration improving - "a little better". Completing tasks. Managing minimal aspects of household. Retired. Denies SI or HI.  Denies AH or VH. Denies self harm. Denies substance use.  Previous medication trials: Pristiq, Wellbutrin, Cymbalta, other medications   Flowsheet Row ED from 05/20/2022 in [facility] Emergency Department at [facility] ED to Hosp-Admission (Discharged) from 03/22/2021 in [place] 2C CV PROGRESSIVE CARE  C-SSRS RISK CATEGORY No Risk No Risk        Review of Systems:  Review of Systems  Musculoskeletal:  Negative for gait problem.  Neurological:  Negative for tremors.  Psychiatric/Behavioral:         Please refer to HPI    Medications: I have reviewed the  patient's current medications.  Current Outpatient Medications  Medication Sig Dispense Refill   acetaminophen (TYLENOL) 325 MG tablet Take 2 tablets (650 mg total) by mouth every 6 (six) hours as needed for mild pain or headache. 30 tablet    ARIPiprazole (ABILIFY) 10 MG tablet Take 1 tablet (10 mg total) by mouth daily. 30 tablet 2   buPROPion (WELLBUTRIN XL) 300 MG 24 hr tablet Take 1 tablet (300 mg total) by mouth daily. 30 tablet 2   camphor-menthol (SARNA) lotion Apply 1 application topically as needed for itching. 222 mL 0   divalproex (DEPAKOTE ER) 500 MG 24 hr tablet Take 1 tablet (500 mg total) by mouth at bedtime. 30 tablet 2   docusate sodium (COLACE) 100 MG capsule Take 200 mg by mouth daily.     Ferrous Sulfate Dried (HIGH POTENCY IRON) 65 MG TABS Take 1 tablet by mouth daily.     folic acid (FOLVITE) 1 MG tablet Take 1 tablet (1 mg total) by mouth daily.     levonorgestrel (MIRENA) 20 MCG/DAY IUD 1 each by Intrauterine route once.     metoprolol succinate (TOPROL-XL) 25 MG 24 hr tablet TAKE 2 TABLETS BY MOUTH IN THE MORNING AND 1 IN THE EVENING 270 tablet 0   oxybutynin (DITROPAN-XL) 5 MG 24 hr tablet Take 5 mg by mouth every evening.     potassium chloride (KLOR-CON) 10 MEQ tablet Take 10 mEq by mouth 3 (three) times daily.     pravastatin (PRAVACHOL) 40 MG tablet Take 1 tablet (40 mg total) by mouth daily at 6 PM. 30 tablet 0   senna-docusate (SENOKOT-S)  8.6-50 MG tablet Take 1 tablet by mouth 2 (two) times daily.     torsemide (DEMADEX) 20 MG tablet Take 1 tablet (20 mg total) by mouth daily. Please take torsemide 20 mg twice daily if weight greater than 210lbs. 90 tablet 3   traZODone (DESYREL) 50 MG tablet Take one to two tablets at bedtime for sleep. 60 tablet 2   vitamin B-12 1000 MCG tablet Take 1 tablet (1,000 mcg total) by mouth daily.     warfarin (COUMADIN) 1 MG tablet TAKE 1 TO 2 TABLETS BY MOUTH ONCE DAILY AS DIRECTED BY  COUMADIN  CLINIC 45 tablet 0   warfarin  (COUMADIN) 1 MG tablet TAKE 1-1.5 TABLETS ONCE DAILY AS DIRECTED BY COUMADIN CLINIC 100 tablet 0   No current facility-administered medications for this visit.    Medication Side Effects: None  Allergies:  Allergies  Allergen Reactions   Vancomycin Itching    Patient can receive vancomycin with slower infusion and prn benadryl for itching  Patient can receive vancomycin with slower infusion and prn benadryl for itching     Patient can receive vancomycin with slower infusion and prn benadryl for itching    Past Medical History:  Diagnosis Date   Anemia 06/03/2019   Aortic stenosis 03/23/2013   Overview:  02/18/13 TTE EF >55%. Critical AS with mean Ao valve gradient of 82 mm Hg. No AI. No MR, PR, mild TR. Estimated RVSP 30 mm Hg.   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 Postmenopausal bleeding since for the past 2 years.  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 Last biopsy was in May of 2019  which showed weakly proliferative endometrium possibly a polyp.     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    Past Medical History, Surgical history, Social history, and Family history were reviewed and updated as appropriate.   Please see review of systems for further details on the patient's review from today.   Objective:   Physical Exam:  There were no vitals taken for this visit.  Physical Exam Constitutional:      General: She is not in acute distress. Musculoskeletal:        General: No deformity.  Neurological:     Mental Status: She is alert and oriented to person, place, and time.     Coordination: Coordination normal.  Psychiatric:        Attention and Perception: Attention and perception normal. She does not perceive  auditory or visual hallucinations.        Mood and Affect: Mood normal. Mood is not anxious or depressed. Affect is not labile, blunt, angry or inappropriate.        Speech: Speech normal.        Behavior: Behavior normal.        Thought Content: Thought content normal. Thought content is not paranoid or delusional. Thought content does not include homicidal or suicidal ideation. Thought content does not include homicidal or suicidal plan.        Cognition and Memory: Cognition and memory normal.        Judgment: Judgment normal.     Comments: Insight intact     Lab Review:     Component Value Date/Time   NA 140 03/04/2023 1115   K 4.2 03/04/2023 1115   CL 100 03/04/2023 1115   CO2 25 03/04/2023 1115   GLUCOSE 103 (H) 03/04/2023 1115   GLUCOSE 88 03/31/2021 0042   BUN 12 03/04/2023 1115   CREATININE 1.05 (H) 03/04/2023 1115   CALCIUM 9.8 03/04/2023 1115   PROT 6.8 03/04/2023 1115   ALBUMIN 4.2 03/04/2023 1115   AST 14 03/04/2023 1115   ALT 9 03/04/2023 1115   ALKPHOS 54 03/04/2023 1115   BILITOT 0.4 03/04/2023 1115   GFRNONAA >60 03/31/2021 0042   GFRAA 74 06/06/2020 0903        Component Value Date/Time   WBC 9.8 03/04/2023 1115   WBC 13.4 (H) 04/02/2021 0008   RBC 4.77 03/04/2023 1115   RBC 5.45 (A) 08/07/2021 0000   HGB 14.4 03/04/2023 1115   HCT 45.2 03/04/2023 1115   PLT 181 03/04/2023 1115   MCV 95 03/04/2023 1115   MCV 88 08/07/2021 0000   MCH 30.2 03/04/2023 1115   MCH 27.9 04/02/2021 0008   MCHC 31.9 03/04/2023 1115   MCHC 30.4 04/02/2021 0008   RDW 14.0 03/04/2023 1115   LYMPHSABS 2.0 03/31/2021 0042   MONOABS 0.9 03/31/2021 0042   EOSABS 0.8 (H) 03/31/2021 0042   BASOSABS 0.1 03/31/2021 0042    No results found for: "POCLITH", "LITHIUM"   No results found for: "PHENYTOIN", "PHENOBARB", "VALPROATE", "CBMZ"   .res Assessment: Plan:    Plan:  PDMP reviewed  Started in late December:  Abilify 10 mg daily - mood stabilization Depakote ER 500mg  daily - mood stabilization Trazadone 50mg  - 1 to 2 at hs  Increase Wellbutrin XL 150mg  to 300mg  every morning - denies seizure history  RTC 4 weeks  Patient advised to contact office with any questions, adverse effects, or acute worsening in signs and symptoms.  Discussed potential metabolic side effects associated with atypical antipsychotics, as well as potential risk for movement side effects. Advised pt to contact office if movement side effects occur.    Discussed potential benefits, risks, and side effects of Depakote and need for period lab monitoring to assess for potential adverse effects to include obtaining CBC and LFTs.   Diagnoses and all orders for this visit:  Bipolar I disorder (HCC) -     buPROPion (WELLBUTRIN XL) 300 MG 24 hr tablet; Take 1 tablet (300 mg total) by mouth daily.     Please see After Visit Summary for patient specific instructions.  Future Appointments  Date Time Provider Department Center  05/13/2023  2:00 PM [facility] None    No orders of the defined types were placed in this encounter.   -------------------------------

## 2023-05-13 ENCOUNTER — Ambulatory Visit: Payer: Medicare Other | Attending: Cardiology

## 2023-05-13 DIAGNOSIS — Z7901 Long term (current) use of anticoagulants: Secondary | ICD-10-CM | POA: Insufficient documentation

## 2023-05-13 DIAGNOSIS — G459 Transient cerebral ischemic attack, unspecified: Secondary | ICD-10-CM | POA: Insufficient documentation

## 2023-05-13 LAB — POCT INR: INR: 2.2 (ref 2.0–3.0)

## 2023-05-13 NOTE — Patient Instructions (Signed)
Description   Continue taking 1 tablet daily EXCEPT 1.5 tablets on Sundays.  Stay consistent with greens each week.  Recheck INR in 6 weeks [facility] [phone]

## 2023-05-20 ENCOUNTER — Ambulatory Visit (INDEPENDENT_AMBULATORY_CARE_PROVIDER_SITE_OTHER): Payer: Medicare Other | Admitting: Adult Health

## 2023-05-20 ENCOUNTER — Encounter: Payer: Self-pay | Admitting: Adult Health

## 2023-05-20 DIAGNOSIS — F319 Bipolar disorder, unspecified: Secondary | ICD-10-CM

## 2023-05-20 DIAGNOSIS — Z79899 Other long term (current) drug therapy: Secondary | ICD-10-CM | POA: Diagnosis not present

## 2023-05-20 MED ORDER — DIVALPROEX SODIUM ER 500 MG PO TB24
500.0000 mg | ORAL_TABLET | Freq: Every day | ORAL | 2 refills | Status: DC
Start: 2023-05-20 — End: 2023-07-29

## 2023-05-20 MED ORDER — BUPROPION HCL ER (XL) 150 MG PO TB24: 150.0000 mg | ORAL_TABLET | Freq: Every day | ORAL | 2 refills | Status: DC

## 2023-05-20 MED ORDER — BUPROPION HCL ER (XL) 300 MG PO TB24
300.0000 mg | ORAL_TABLET | Freq: Every day | ORAL | 2 refills | Status: DC
Start: 2023-05-20 — End: 2023-06-24

## 2023-05-20 MED ORDER — TRAZODONE HCL 50 MG PO TABS
ORAL_TABLET | ORAL | 2 refills | Status: DC
Start: 2023-05-20 — End: 2023-07-29

## 2023-05-20 MED ORDER — ARIPIPRAZOLE 10 MG PO TABS
10.0000 mg | ORAL_TABLET | Freq: Every day | ORAL | 2 refills | Status: DC
Start: 2023-05-20 — End: 2023-07-29

## 2023-05-20 NOTE — Progress Notes (Signed)
[name] [id number] 1955/12/01 67 y.o.  Subjective:   Patient ID:  [name] is a 67 y.o. (DOB 30-Mar-1956) female.  Chief Complaint: No chief complaint on file.   HPI [name] presents to the office today for follow-up of Bipolar Disorder.  Accompanied by husband - seeing some improvement.  Describes mood today as "ok". Pleasant. Denies tearfulness. Mood symptoms - denies depression, anxiety and irritability. Denies panic attacks. Reports some worry. Denies rumination and over thinking. Denies mania. Mood is lower. Stating "I feel like I'm doing better". Feels like the increase of Wellbutrin has been helpful. Getting out of the house more. Improved interest and motivation. Taking medications as prescribed. Energy levels improved. Active, does not have a regular exercise routine - started a silver sneakers program at the [facility] - going once a week. Does not enjoy usual interests and activities. Married. Lives with husband. Spending time with family. Appetite adequate. Weight stable. Sleeps well most nights. Averages 6 to 7 hours of broken sleep during the night. Reports daytime napping. Focus and concentration improving. Completing tasks. Managing minimal aspects of household. Retired. Denies SI or HI.  Denies AH or VH. Denies self harm. Denies substance use.  Previous medication trials: Pristiq, Wellbutrin, Cymbalta, other medications    Flowsheet Row ED from 05/20/2022 in [facility] Emergency Department at [facility] ED to Hosp-Admission (Discharged) from 03/22/2021 in [place] 2C CV PROGRESSIVE CARE  C-SSRS RISK CATEGORY No Risk No Risk        Review of Systems:  Review of Systems  Musculoskeletal:  Negative for gait problem.  Neurological:  Negative for tremors.  Psychiatric/Behavioral:         Please refer to HPI    Medications: I have reviewed the patient's current medications.  Current Outpatient Medications  Medication Sig Dispense Refill    acetaminophen (TYLENOL) 325 MG tablet Take 2 tablets (650 mg total) by mouth every 6 (six) hours as needed for mild pain or headache. 30 tablet    ARIPiprazole (ABILIFY) 10 MG tablet Take 1 tablet (10 mg total) by mouth daily. 30 tablet 2   buPROPion (WELLBUTRIN XL) 300 MG 24 hr tablet Take 1 tablet (300 mg total) by mouth daily. 30 tablet 2   camphor-menthol (SARNA) lotion Apply 1 application topically as needed for itching. 222 mL 0   divalproex (DEPAKOTE ER) 500 MG 24 hr tablet Take 1 tablet (500 mg total) by mouth at bedtime. 30 tablet 2   docusate sodium (COLACE) 100 MG capsule Take 200 mg by mouth daily.     Ferrous Sulfate Dried (HIGH POTENCY IRON) 65 MG TABS Take 1 tablet by mouth daily.     folic acid (FOLVITE) 1 MG tablet Take 1 tablet (1 mg total) by mouth daily.     levonorgestrel (MIRENA) 20 MCG/DAY IUD 1 each by Intrauterine route once.     metoprolol succinate (TOPROL-XL) 25 MG 24 hr tablet TAKE 2 TABLETS BY MOUTH IN THE MORNING AND 1 IN THE EVENING 270 tablet 0   oxybutynin (DITROPAN-XL) 5 MG 24 hr tablet Take 5 mg by mouth every evening.     potassium chloride (KLOR-CON) 10 MEQ tablet Take 10 mEq by mouth 3 (three) times daily.     pravastatin (PRAVACHOL) 40 MG tablet Take 1 tablet (40 mg total) by mouth daily at 6 PM. 30 tablet 0   senna-docusate (SENOKOT-S) 8.6-50 MG tablet Take 1 tablet by mouth 2 (two) times daily.  torsemide (DEMADEX) 20 MG tablet Take 1 tablet (20 mg total) by mouth daily. Please take torsemide 20 mg twice daily if weight greater than 210lbs. 90 tablet 3   traZODone (DESYREL) 50 MG tablet Take one to two tablets at bedtime for sleep. 60 tablet 2   vitamin B-12 1000 MCG tablet Take 1 tablet (1,000 mcg total) by mouth daily.     warfarin (COUMADIN) 1 MG tablet TAKE 1 TO 2 TABLETS BY MOUTH ONCE DAILY AS DIRECTED BY  COUMADIN  CLINIC 45 tablet 0   warfarin (COUMADIN) 1 MG tablet TAKE 1-1.5 TABLETS ONCE DAILY AS DIRECTED BY COUMADIN CLINIC 100 tablet 0   No  current facility-administered medications for this visit.    Medication Side Effects: None  Allergies:  Allergies  Allergen Reactions   Vancomycin Itching    Patient can receive vancomycin with slower infusion and prn benadryl for itching  Patient can receive vancomycin with slower infusion and prn benadryl for itching     Patient can receive vancomycin with slower infusion and prn benadryl for itching    Past Medical History:  Diagnosis Date   Anemia 06/03/2019   Aortic stenosis 03/23/2013   Overview:  02/18/13 TTE EF >55%. Critical AS with mean Ao valve gradient of 82 mm Hg. No AI. No MR, PR, mild TR. Estimated RVSP 30 mm Hg.   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 Postmenopausal bleeding since for the past 2 years.  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 Last biopsy was in May of 2019 which showed weakly proliferative endometrium possibly a polyp.     Sleep apnea    TIA (transient  ischemic attack) 06/04/2019   Transient neurologic deficit 06/03/2019    Past Medical History, Surgical history, Social history, and Family history were reviewed and updated as appropriate.   Please see review of systems for further details on the patient's review from today.   Objective:   Physical Exam:  There were no vitals taken for this visit.  Physical Exam Constitutional:      General: She is not in acute distress. Musculoskeletal:        General: No deformity.  Neurological:     Mental Status: She is alert and oriented to person, place, and time.     Coordination: Coordination normal.  Psychiatric:        Attention and Perception: Attention and perception normal. She does not perceive auditory or visual hallucinations.        Mood and Affect: Mood normal.  Mood is not anxious or depressed. Affect is not labile, blunt, angry or inappropriate.        Speech: Speech normal.        Behavior: Behavior normal.        Thought Content: Thought content normal. Thought content is not paranoid or delusional. Thought content does not include homicidal or suicidal ideation. Thought content does not include homicidal or suicidal plan.        Cognition and Memory: Cognition and memory normal.        Judgment: Judgment normal.     Comments: Insight intact     Lab Review:     Component Value Date/Time   NA 140 03/04/2023 1115   K 4.2 03/04/2023 1115   CL 100 03/04/2023 1115   CO2 25 03/04/2023 1115   GLUCOSE 103 (H) 03/04/2023 1115   GLUCOSE 88 03/31/2021 0042   BUN 12 03/04/2023 1115   CREATININE 1.05 (H) 03/04/2023 1115   CALCIUM 9.8 03/04/2023 1115   PROT 6.8 03/04/2023 1115   ALBUMIN 4.2 03/04/2023 1115   AST 14 03/04/2023 1115   ALT 9 03/04/2023 1115   ALKPHOS 54 03/04/2023 1115   BILITOT 0.4 03/04/2023 1115   GFRNONAA >60 03/31/2021 0042   GFRAA 74 06/06/2020 0903       Component Value Date/Time   WBC 9.8 03/04/2023 1115   WBC 13.4 (H) 04/02/2021 0008   RBC 4.77  03/04/2023 1115   RBC 5.45 (A) 08/07/2021 0000   HGB 14.4 03/04/2023 1115   HCT 45.2 03/04/2023 1115   PLT 181 03/04/2023 1115   MCV 95 03/04/2023 1115   MCV 88 08/07/2021 0000   MCH 30.2 03/04/2023 1115   MCH 27.9 04/02/2021 0008   MCHC 31.9 03/04/2023 1115   MCHC 30.4 04/02/2021 0008   RDW 14.0 03/04/2023 1115   LYMPHSABS 2.0 03/31/2021 0042   MONOABS 0.9 03/31/2021 0042   EOSABS 0.8 (H) 03/31/2021 0042   BASOSABS 0.1 03/31/2021 0042    No results found for: "POCLITH", "LITHIUM"   No results found for: "PHENYTOIN", "PHENOBARB", "VALPROATE", "CBMZ"   .res Assessment: Plan:    Plan:  PDMP reviewed  Abilify 10 mg daily - mood stabilization Depakote ER 500mg  daily - mood stabilization Trazadone 50mg  - 1 to 2 at hs Wellbutrin XL 300mg  every morning - denies seizure history  Add Wellbutrin XL 150mg  every morning  RTC 4 weeks  Will make appointment with PCP for labs and to discuss P/T - Dr Ralene Cork.  Patient advised to contact office with any questions, adverse effects, or acute worsening in signs and symptoms.  Discussed potential metabolic side effects associated with atypical antipsychotics, as well as potential risk for movement side effects. Advised pt to contact office if movement side effects occur.    Discussed potential benefits, risks, and side effects of Depakote and need for period lab monitoring to assess for potential adverse effects to include obtaining CBC and LFTs.   There are no diagnoses linked to this encounter.   Please see After Visit Summary for patient specific instructions.  Future Appointments  Date Time Provider Department Center  05/20/2023 11:20 AM Egan Berkheimer, Thereasa Solo, NP CP-CP None  06/24/2023  2:00 PM [facility] None    No orders of the defined types were placed in this encounter.   -------------------------------

## 2023-06-07 ENCOUNTER — Encounter: Payer: Self-pay | Admitting: Cardiology

## 2023-06-07 ENCOUNTER — Other Ambulatory Visit: Payer: Self-pay | Admitting: Cardiology

## 2023-06-09 ENCOUNTER — Other Ambulatory Visit (HOSPITAL_COMMUNITY): Payer: Self-pay

## 2023-06-09 MED ORDER — METOPROLOL SUCCINATE ER 25 MG PO TB24
ORAL_TABLET | ORAL | 0 refills | Status: DC
  Filled 2023-06-09: qty 360, fill #0

## 2023-06-16 ENCOUNTER — Telehealth: Payer: Self-pay | Admitting: Cardiology

## 2023-06-16 DIAGNOSIS — R197 Diarrhea, unspecified: Secondary | ICD-10-CM | POA: Insufficient documentation

## 2023-06-16 MED ORDER — METOPROLOL SUCCINATE ER 25 MG PO TB24: ORAL_TABLET | ORAL | 0 refills | Status: DC

## 2023-06-16 NOTE — Telephone Encounter (Signed)
*  STAT* If patient is at the pharmacy, call can be transferred to refill team.   1. Which medications need to be refilled? (please list name of each medication and dose if known)   metoprolol succinate (TOPROL-XL) 25 MG 24 hr tablet     2. Would you like to learn more about the convenience, safety, & potential cost savings by using the [facility]? No   3. Are you open to using the [facility] No   4. Which pharmacy/location (including street and city if local pharmacy) is medication to be sent to?[email or web address] - [place] - [address]    5. Do they need a 30 day or 90 day supply? 90 Day Supply

## 2023-06-16 NOTE — Telephone Encounter (Signed)
Refill of Metoprolol Succinate 25 mg sent to [facility].

## 2023-06-24 ENCOUNTER — Other Ambulatory Visit: Payer: Self-pay

## 2023-06-24 ENCOUNTER — Ambulatory Visit: Payer: Medicare Other | Attending: Cardiology

## 2023-06-24 ENCOUNTER — Telehealth: Payer: Self-pay | Admitting: Adult Health

## 2023-06-24 DIAGNOSIS — Z954 Presence of other heart-valve replacement: Secondary | ICD-10-CM | POA: Diagnosis present

## 2023-06-24 DIAGNOSIS — G459 Transient cerebral ischemic attack, unspecified: Secondary | ICD-10-CM

## 2023-06-24 DIAGNOSIS — F319 Bipolar disorder, unspecified: Secondary | ICD-10-CM

## 2023-06-24 DIAGNOSIS — Z7901 Long term (current) use of anticoagulants: Secondary | ICD-10-CM | POA: Diagnosis present

## 2023-06-24 LAB — POCT INR: INR: 1.7 — AB (ref 2.0–3.0)

## 2023-06-24 MED ORDER — BUPROPION HCL ER (XL) 300 MG PO TB24
300.0000 mg | ORAL_TABLET | Freq: Every day | ORAL | 0 refills | Status: DC
Start: 2023-06-24 — End: 2023-08-31

## 2023-06-24 MED ORDER — BUPROPION HCL ER (XL) 150 MG PO TB24: 150.0000 mg | ORAL_TABLET | Freq: Every day | ORAL | 0 refills | Status: DC

## 2023-06-24 NOTE — Telephone Encounter (Signed)
Rx sent via [facility]; previous rx canceled at org. Pharm.

## 2023-06-24 NOTE — Patient Instructions (Addendum)
Description   Take 1.5 tablets today and then continue taking 1 tablet daily EXCEPT 1.5 tablets on Sundays.  Stay consistent with greens each week.  Recheck INR in 4 weeks [facility] [phone]

## 2023-06-24 NOTE — Telephone Encounter (Signed)
Pt is switching from local pharmacy to using [facility]. Please send in RX for Buproprion XL 300mg  and 150mg  to [facility]. They were not able to transfer that RX. [facility]. [address] [place] [address]

## 2023-07-01 ENCOUNTER — Ambulatory Visit: Payer: Medicare Other | Admitting: Adult Health

## 2023-07-01 ENCOUNTER — Encounter: Payer: Self-pay | Admitting: Adult Health

## 2023-07-01 DIAGNOSIS — F319 Bipolar disorder, unspecified: Secondary | ICD-10-CM | POA: Diagnosis not present

## 2023-07-01 NOTE — Progress Notes (Signed)
[name] [id number] [birth year] 67 y.o.  Subjective:   Patient ID:  [name] is a 67 y.o. (DOB Jan 22, 1956) female.  Chief Complaint: No chief complaint on file.   HPI [name] presents to the office today for follow-up of Bipolar Disorder.  Accompanied by husband - reporting some improvements.  Describes mood today as "ok". Pleasant. Denies tearfulness. Mood symptoms - denies depression, anxiety and irritability. Denies panic attacks. Reports some worry - "that's something that is still there". Reports some rumination and over thinking - "what's going to happen next". Denies mania. Mood has improved. Reports having more "better" days. Husband noting some improvement since last visit. Stating "I feel like I'm doing better". Reports some physical imitations - working with P/T, but may need more assistance. Feels like the medications are helpful. Improved interest and motivation. Taking medications as prescribed. Energy levels improved. Active, does not have a regular exercise routine with physical limitations.  Enjoys usual interests and activities. Married. Lives with husband. Spending time with family. Appetite adequate. Weight stable. Sleeps well most nights. Averages 9 hours of broken sleep. Reports some daytime napping. Focus and concentration improving. Completing tasks. Managing minimal aspects of household. Retired. Denies SI or HI.  Denies AH or VH. Denies self harm. Denies substance use. Reports some paranoia.  Previous medication trials: Pristiq, Wellbutrin, Cymbalta, other medications   Flowsheet Row ED from 05/20/2022 in [facility] Emergency Department at [facility] ED to Hosp-Admission (Discharged) from 03/22/2021 in [place] 2C CV PROGRESSIVE CARE  C-SSRS RISK CATEGORY No Risk No Risk        Review of Systems:  Review of Systems  Musculoskeletal:  Negative for gait problem.  Neurological:  Negative for tremors.  Psychiatric/Behavioral:          Please refer to HPI    Medications: I have reviewed the patient's current medications.  Current Outpatient Medications  Medication Sig Dispense Refill   acetaminophen (TYLENOL) 325 MG tablet Take 2 tablets (650 mg total) by mouth every 6 (six) hours as needed for mild pain or headache. 30 tablet    ARIPiprazole (ABILIFY) 10 MG tablet Take 1 tablet (10 mg total) by mouth daily. 30 tablet 2   buPROPion (WELLBUTRIN XL) 150 MG 24 hr tablet Take 1 tablet (150 mg total) by mouth daily. 90 tablet 0   buPROPion (WELLBUTRIN XL) 300 MG 24 hr tablet Take 1 tablet (300 mg total) by mouth daily. 90 tablet 0   camphor-menthol (SARNA) lotion Apply 1 application topically as needed for itching. 222 mL 0   divalproex (DEPAKOTE ER) 500 MG 24 hr tablet Take 1 tablet (500 mg total) by mouth at bedtime. 30 tablet 2   docusate sodium (COLACE) 100 MG capsule Take 200 mg by mouth daily.     Ferrous Sulfate Dried (HIGH POTENCY IRON) 65 MG TABS Take 1 tablet by mouth daily.     folic acid (FOLVITE) 1 MG tablet Take 1 tablet (1 mg total) by mouth daily.     levonorgestrel (MIRENA) 20 MCG/DAY IUD 1 each by Intrauterine route once.     metoprolol succinate (TOPROL-XL) 25 MG 24 hr tablet Take 2 tablets (50 mg total) by mouth in the morning AND 1 tablet (25 mg total) every evening. 360 tablet 0   oxybutynin (DITROPAN-XL) 5 MG 24 hr tablet Take 5 mg by mouth every evening.     potassium chloride (KLOR-CON) 10 MEQ tablet Take 10 mEq by mouth 3 (three) times daily.  pravastatin (PRAVACHOL) 40 MG tablet Take 1 tablet (40 mg total) by mouth daily at 6 PM. 30 tablet 0   senna-docusate (SENOKOT-S) 8.6-50 MG tablet Take 1 tablet by mouth 2 (two) times daily.     torsemide (DEMADEX) 20 MG tablet Take 1 tablet (20 mg total) by mouth daily. Please take torsemide 20 mg twice daily if weight greater than 210lbs. 90 tablet 3   traZODone (DESYREL) 50 MG tablet Take one to two tablets at bedtime for sleep. 60 tablet 2   vitamin  B-12 1000 MCG tablet Take 1 tablet (1,000 mcg total) by mouth daily.     warfarin (COUMADIN) 1 MG tablet TAKE 1 TO 2 TABLETS BY MOUTH ONCE DAILY AS DIRECTED BY  COUMADIN  CLINIC 45 tablet 0   warfarin (COUMADIN) 1 MG tablet TAKE 1-1.5 TABLETS ONCE DAILY AS DIRECTED BY COUMADIN CLINIC 100 tablet 0   No current facility-administered medications for this visit.    Medication Side Effects: None  Allergies:  Allergies  Allergen Reactions   Vancomycin Itching    Patient can receive vancomycin with slower infusion and prn benadryl for itching  Patient can receive vancomycin with slower infusion and prn benadryl for itching     Patient can receive vancomycin with slower infusion and prn benadryl for itching    Past Medical History:  Diagnosis Date   Anemia 06/03/2019   Aortic stenosis 03/23/2013   Overview:  02/18/13 TTE EF >55%. Critical AS with mean Ao valve gradient of 82 mm Hg. No AI. No MR, PR, mild TR. Estimated RVSP 30 mm Hg.   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 Postmenopausal bleeding since for the past 2 years.   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 Last biopsy was in May of 2019 which showed weakly proliferative endometrium possibly a polyp.     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    Past Medical History, Surgical history, Social history, and Family history were reviewed and updated as appropriate.   Please see review of systems for further details on the patient's review from today.   Objective:   Physical Exam:  There were no vitals taken for this visit.  Physical Exam Constitutional:      General: She is not in acute distress. Musculoskeletal:        General: No deformity.  Neurological:     Mental Status: She is alert and oriented to person, place, and time.  Coordination: Coordination normal.  Psychiatric:        Attention and Perception: Attention and perception normal. She does not perceive auditory or visual hallucinations.        Mood and Affect: Affect is not labile, blunt, angry or inappropriate.        Speech: Speech normal.        Behavior: Behavior normal.        Thought Content: Thought content normal. Thought content is not paranoid or delusional. Thought content does not include homicidal or suicidal ideation. Thought content does not include homicidal or suicidal plan.        Cognition and Memory: Cognition and memory normal.        Judgment: Judgment normal.     Comments: Insight intact     Lab Review:     Component Value Date/Time   NA 140 03/04/2023 1115   K 4.2 03/04/2023 1115   CL 100 03/04/2023 1115   CO2 25 03/04/2023 1115   GLUCOSE 103 (H) 03/04/2023 1115   GLUCOSE 88 03/31/2021 0042   BUN 12 03/04/2023 1115   CREATININE 1.05 (H) 03/04/2023 1115   CALCIUM 9.8 03/04/2023 1115   PROT 6.8 03/04/2023 1115   ALBUMIN 4.2 03/04/2023 1115   AST 14 03/04/2023 1115    ALT 9 03/04/2023 1115   ALKPHOS 54 03/04/2023 1115   BILITOT 0.4 03/04/2023 1115   GFRNONAA >60 03/31/2021 0042   GFRAA 74 06/06/2020 0903       Component Value Date/Time   WBC 9.8 03/04/2023 1115   WBC 13.4 (H) 04/02/2021 0008   RBC 4.77 03/04/2023 1115   RBC 5.45 (A) 08/07/2021 0000   HGB 14.4 03/04/2023 1115   HCT 45.2 03/04/2023 1115   PLT 181 03/04/2023 1115   MCV 95 03/04/2023 1115   MCV 88 08/07/2021 0000   MCH 30.2 03/04/2023 1115   MCH 27.9 04/02/2021 0008   MCHC 31.9 03/04/2023 1115   MCHC 30.4 04/02/2021 0008   RDW 14.0 03/04/2023 1115   LYMPHSABS 2.0 03/31/2021 0042   MONOABS 0.9 03/31/2021 0042   EOSABS 0.8 (H) 03/31/2021 0042   BASOSABS 0.1 03/31/2021 0042    No results found for: "POCLITH", "LITHIUM"   No results found for: "PHENYTOIN", "PHENOBARB", "VALPROATE", "CBMZ"   .res Assessment: Plan:    Plan:  PDMP reviewed  Abilify 10 mg daily - mood stabilization Depakote ER 500mg  daily - mood stabilization Trazadone 50mg  - 1 to 2 at hs Wellbutrin XL 300mg  every morning - denies seizure history Wellbutrin XL 150mg  every morning  Patient seems to be making progress emotionally - more appropriate. She is still week and needs assistance. Would like to have a more intense P/T program for her. Husband plans to call her PCP.  RTC 4 weeks  Will request medical records.  Patient advised to contact office with any questions, adverse effects, or acute worsening in signs and symptoms.  Discussed potential metabolic side effects associated with atypical antipsychotics, as well as potential risk for movement side effects. Advised pt to contact office if movement side effects occur.    Discussed potential benefits, risks, and side effects of Depakote and need for period lab monitoring to assess for potential adverse effects to include obtaining CBC and LFTs.   There are no diagnoses linked to this encounter.   Please see After Visit Summary for patient  specific instructions.  Future Appointments  Date Time Provider Department Center  07/01/2023  2:40 PM Blakely Gluth, Thereasa Solo, NP  CP-CP None  07/22/2023  2:15 PM [facility] None    No orders of the defined types were placed in this encounter.   -------------------------------

## 2023-07-15 ENCOUNTER — Other Ambulatory Visit: Payer: Self-pay | Admitting: Cardiology

## 2023-07-15 DIAGNOSIS — Z5181 Encounter for therapeutic drug level monitoring: Secondary | ICD-10-CM

## 2023-07-15 NOTE — Telephone Encounter (Signed)
Refill request for warfarin:  Last INR was 1.7 on 06/24/23 Next INR due 07/22/23 LOV was 03/04/23  Refill approved.

## 2023-07-16 ENCOUNTER — Other Ambulatory Visit: Payer: Self-pay

## 2023-07-16 DIAGNOSIS — Z5181 Encounter for therapeutic drug level monitoring: Secondary | ICD-10-CM

## 2023-07-16 MED ORDER — WARFARIN SODIUM 1 MG PO TABS: ORAL_TABLET | ORAL | 1 refills | Status: DC

## 2023-07-22 ENCOUNTER — Ambulatory Visit: Payer: Medicare Other | Attending: Cardiology

## 2023-07-22 DIAGNOSIS — Z7901 Long term (current) use of anticoagulants: Secondary | ICD-10-CM | POA: Insufficient documentation

## 2023-07-22 DIAGNOSIS — G459 Transient cerebral ischemic attack, unspecified: Secondary | ICD-10-CM | POA: Insufficient documentation

## 2023-07-22 DIAGNOSIS — Z954 Presence of other heart-valve replacement: Secondary | ICD-10-CM | POA: Insufficient documentation

## 2023-07-22 LAB — POCT INR: INR: 1.3 — AB (ref 2.0–3.0)

## 2023-07-22 NOTE — Patient Instructions (Addendum)
Description   Take 2 tablets today and 1.5 tablets tomorrow and then START taking 1 tablet daily EXCEPT 2 tablets on Sundays.   Stay consistent with greens each week  Recheck INR in 1 week.  [facility] [phone]

## 2023-07-29 ENCOUNTER — Encounter: Payer: Self-pay | Admitting: Adult Health

## 2023-07-29 ENCOUNTER — Ambulatory Visit: Payer: Medicare Other | Attending: Cardiology

## 2023-07-29 ENCOUNTER — Ambulatory Visit: Payer: Medicare Other | Admitting: Adult Health

## 2023-07-29 DIAGNOSIS — Z7901 Long term (current) use of anticoagulants: Secondary | ICD-10-CM | POA: Diagnosis present

## 2023-07-29 DIAGNOSIS — G459 Transient cerebral ischemic attack, unspecified: Secondary | ICD-10-CM | POA: Diagnosis present

## 2023-07-29 DIAGNOSIS — Z954 Presence of other heart-valve replacement: Secondary | ICD-10-CM | POA: Diagnosis present

## 2023-07-29 DIAGNOSIS — F319 Bipolar disorder, unspecified: Secondary | ICD-10-CM

## 2023-07-29 LAB — POCT INR: INR: 1.7 — AB (ref 2.0–3.0)

## 2023-07-29 MED ORDER — ARIPIPRAZOLE 5 MG PO TABS
ORAL_TABLET | ORAL | 2 refills | Status: DC
Start: 2023-07-29 — End: 2023-11-04

## 2023-07-29 MED ORDER — DIVALPROEX SODIUM ER 500 MG PO TB24: 500.0000 mg | ORAL_TABLET | Freq: Every day | ORAL | 2 refills | Status: DC

## 2023-07-29 MED ORDER — TRAZODONE HCL 50 MG PO TABS: ORAL_TABLET | ORAL | 2 refills | Status: DC

## 2023-07-29 NOTE — Patient Instructions (Signed)
Description   Take 2 tablets today and then continue taking 1 tablet daily EXCEPT 2 tablets on Sundays.   Stay consistent with greens each week  Recheck INR in 2 weeks.  [facility] [phone]

## 2023-07-29 NOTE — Progress Notes (Signed)
[name] [id number] 17-Dec-1955 67 y.o.  Subjective:   Patient ID:  [name] is a 66 y.o. (DOB 07/12/1956) female.  Chief Complaint: No chief complaint on file.   HPI [name] presents to the office today for follow-up of  Bipolar Disorder.  Accompanied by husband - reporting some improvements.  Describes mood today as "ok". Pleasant. Denies tearfulness. Mood symptoms - denies depression, anxiety and irritability. Denies panic attacks. Reports some worry rumination and over thinking - "what might happen to me and my husband". Denies mania. Mood has improved. Stating "I feel like I'm doing better than I have been". Husband feels she is making progress. Feels like medications are helpful. Improved interest and motivation. Taking medications as prescribed. Energy levels improved. Active, does not have a regular exercise routine with physical limitations. Reports working with P/T - home health once a week. Reports going to silver sneakers twice last week.  Enjoys usual interests and activities. Married. Lives with husband. Spending time with family. Appetite adequate. Weight stable. Sleeps better some nights than others. Reports broken sleep. Reports some daytime napping - varies. Focus and concentration improving. Completing tasks. Managing minimal aspects of household. Retired. Denies SI or HI.  Denies AH or VH. Denies self harm. Denies substance use. Denies some paranoia.  Previous medication trials: Pristiq, Wellbutrin, Cymbalta, other medications   Flowsheet Row ED from 05/20/2022 in [facility] Emergency Department at [facility] ED to Hosp-Admission (Discharged) from 03/22/2021 in [place] [facility] CV PROGRESSIVE CARE  C-SSRS RISK CATEGORY No Risk No Risk        Review of Systems:  Review of Systems  Musculoskeletal:  Negative for gait problem.  Neurological:  Negative for tremors.  Psychiatric/Behavioral:         Please refer to HPI    Medications: I  have reviewed the patient's current medications.  Current Outpatient Medications  Medication Sig Dispense Refill   acetaminophen (TYLENOL) 325 MG tablet Take 2 tablets (650 mg total) by mouth every 6 (six) hours as needed for mild pain or headache. 30 tablet    ARIPiprazole (ABILIFY) 10 MG tablet Take 1 tablet (10 mg total) by mouth daily. 30 tablet 2   buPROPion (WELLBUTRIN XL) 150 MG 24 hr tablet Take 1 tablet (150 mg total) by mouth daily. 90 tablet 0   buPROPion (WELLBUTRIN XL) 300 MG 24 hr tablet Take 1 tablet (300 mg total) by mouth daily. 90 tablet 0   camphor-menthol (SARNA) lotion Apply 1 application topically as needed for itching. 222 mL 0   divalproex (DEPAKOTE ER) 500 MG 24 hr tablet Take 1 tablet (500 mg total) by mouth at bedtime. 30 tablet 2   docusate sodium (COLACE) 100 MG capsule Take 200 mg by mouth daily.     Ferrous Sulfate Dried (HIGH POTENCY IRON) 65 MG TABS Take 1 tablet by mouth daily.     folic acid (FOLVITE) 1 MG tablet Take 1 tablet (1 mg total) by mouth daily.     levonorgestrel (MIRENA) 20 MCG/DAY IUD 1 each by Intrauterine route once.     metoprolol succinate (TOPROL-XL) 25 MG 24 hr tablet Take 2 tablets (50 mg total) by mouth in the morning AND 1 tablet (25 mg total) every evening. 360 tablet 0   oxybutynin (DITROPAN-XL) 5 MG 24 hr tablet Take 5 mg by mouth every evening.     potassium chloride (KLOR-CON) 10 MEQ tablet Take 10 mEq by mouth 3 (three) times daily.  pravastatin (PRAVACHOL) 40 MG tablet Take 1 tablet (40 mg total) by mouth daily at 6 PM. 30 tablet 0   senna-docusate (SENOKOT-S) 8.6-50 MG tablet Take 1 tablet by mouth 2 (two) times daily.     torsemide (DEMADEX) 20 MG tablet Take 1 tablet (20 mg total) by mouth daily. Please take torsemide 20 mg twice daily if weight greater than 210lbs. 90 tablet 3   traZODone (DESYREL) 50 MG tablet Take one to two tablets at bedtime for sleep. 60 tablet 2   vitamin B-12 1000 MCG tablet Take 1 tablet (1,000 mcg  total) by mouth daily.     warfarin (COUMADIN) 1 MG tablet TAKE 1 TO 2 TABLETS BY MOUTH ONCE DAILY AS DIRECTED BY  COUMADIN  CLINIC 45 tablet 0   warfarin (COUMADIN) 1 MG tablet TAKE 1 TO 1 & 1/2 (ONE TO ONE & ONE-HALF) TABLETS BY MOUTH ONCE DAILY AS  DIRECTED  BY  COUMADIN  CLINIC 100 tablet 1   No current facility-administered medications for this visit.    Medication Side Effects: None   Allergies:  Allergies  Allergen Reactions   Vancomycin Itching    Patient can receive vancomycin with slower infusion and prn benadryl for itching  Patient can receive vancomycin with slower infusion and prn benadryl for itching     Patient can receive vancomycin with slower infusion and prn benadryl for itching    Past Medical History:  Diagnosis Date   Anemia 06/03/2019   Aortic stenosis 03/23/2013   Overview:  02/18/13 TTE EF >55%. Critical AS with mean Ao valve gradient of 82 mm Hg. No AI. No MR, PR, mild TR. Estimated RVSP 30 mm Hg.   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 Postmenopausal bleeding since for the past 2 years.   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 Last biopsy was in May of 2019 which showed weakly proliferative endometrium possibly a polyp.     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    Past Medical History, Surgical history, Social history, and Family history were reviewed and updated as appropriate.   Please see review of systems for further details on the patient's review from today.   Objective:   Physical Exam:  There were no vitals taken for this visit.  Physical Exam Constitutional:      General: She is not in acute distress. Musculoskeletal:        General: No deformity.  Neurological:  Mental Status: She is alert and oriented to person, place, and time.     Coordination: Coordination normal.  Psychiatric:        Attention and Perception: Attention and perception normal. She does not perceive auditory or visual hallucinations.        Mood and Affect: Affect is not labile, blunt, angry or inappropriate.        Speech: Speech normal.        Behavior: Behavior normal.        Thought Content: Thought content normal. Thought content is not paranoid or delusional. Thought content does not include homicidal or suicidal ideation. Thought content does not include homicidal or suicidal plan.        Cognition and Memory: Cognition and memory normal.        Judgment: Judgment normal.     Comments: Insight intact     Lab Review:     Component Value Date/Time   NA 140 03/04/2023 1115   K 4.2 03/04/2023 1115   CL 100 03/04/2023 1115   CO2 25 03/04/2023 1115   GLUCOSE 103 (H) 03/04/2023 1115   GLUCOSE 88 03/31/2021 0042   BUN 12 03/04/2023 1115   CREATININE 1.05 (H) 03/04/2023 1115   CALCIUM 9.8 03/04/2023 1115   PROT 6.8 03/04/2023 1115   ALBUMIN 4.2 03/04/2023 1115   AST 14 03/04/2023 1115    ALT 9 03/04/2023 1115   ALKPHOS 54 03/04/2023 1115   BILITOT 0.4 03/04/2023 1115   GFRNONAA >60 03/31/2021 0042   GFRAA 74 06/06/2020 0903       Component Value Date/Time   WBC 9.8 03/04/2023 1115   WBC 13.4 (H) 04/02/2021 0008   RBC 4.77 03/04/2023 1115   RBC 5.45 (A) 08/07/2021 0000   HGB 14.4 03/04/2023 1115   HCT 45.2 03/04/2023 1115   PLT 181 03/04/2023 1115   MCV 95 03/04/2023 1115   MCV 88 08/07/2021 0000   MCH 30.2 03/04/2023 1115   MCH 27.9 04/02/2021 0008   MCHC 31.9 03/04/2023 1115   MCHC 30.4 04/02/2021 0008   RDW 14.0 03/04/2023 1115   LYMPHSABS 2.0 03/31/2021 0042   MONOABS 0.9 03/31/2021 0042   EOSABS 0.8 (H) 03/31/2021 0042   BASOSABS 0.1 03/31/2021 0042    No results found for: "POCLITH", "LITHIUM"   No results found for: "PHENYTOIN", "PHENOBARB", "VALPROATE", "CBMZ"   .res Assessment: Plan:   Plan:  PDMP reviewed  Decrease Abilify 10mg  daily to 7.5mg  - mood stabilization Depakote ER 500mg  daily - mood stabilization Trazadone 50mg  - 1 to 2 at hs Wellbutrin XL 300mg  every morning - denies seizure history Wellbutrin XL 150mg  every morning   Time spent with patient was minutes. Greater than 50% of face to face time with patient was spent on counseling and coordination of care.  Discussed need for P/T and being more active.  RTC 4 weeks  Patient advised to contact office with any questions, adverse effects, or acute worsening in signs and symptoms.  Discussed potential metabolic side effects associated with atypical antipsychotics, as well as potential risk for movement side effects. Advised pt to contact office if movement side effects occur.    Discussed potential benefits, risks, and side effects of Depakote and need for period lab monitoring to assess for potential adverse effects to include obtaining CBC and LFTs.   There are no diagnoses linked to this encounter.   Please see After Visit Summary for patient specific  instructions.  Future Appointments  Date Time  Provider Department Center  07/29/2023  2:40 PM Anuja Manka, Thereasa Solo, NP [facility] None  08/12/2023  1:30 PM [facility] None    No orders of the defined types were placed in this encounter.   -------------------------------

## 2023-08-12 ENCOUNTER — Ambulatory Visit: Payer: Medicare Other | Attending: Cardiology

## 2023-08-12 DIAGNOSIS — G459 Transient cerebral ischemic attack, unspecified: Secondary | ICD-10-CM | POA: Diagnosis present

## 2023-08-12 DIAGNOSIS — Z7901 Long term (current) use of anticoagulants: Secondary | ICD-10-CM | POA: Diagnosis not present

## 2023-08-12 LAB — POCT INR: INR: 1 — AB (ref 2.0–3.0)

## 2023-08-12 NOTE — Patient Instructions (Signed)
Description   Take 2 tablets today and then START taking 1 tablet daily EXCEPT 2 tablets on Mondays and Wednesdays..   Stay consistent with greens each week  Recheck INR in 1 week.  [facility] [phone]

## 2023-08-19 ENCOUNTER — Ambulatory Visit: Payer: Medicare Other | Attending: Cardiology

## 2023-08-19 DIAGNOSIS — Z7901 Long term (current) use of anticoagulants: Secondary | ICD-10-CM | POA: Insufficient documentation

## 2023-08-19 DIAGNOSIS — G459 Transient cerebral ischemic attack, unspecified: Secondary | ICD-10-CM | POA: Insufficient documentation

## 2023-08-19 LAB — POCT INR: INR: 1.4 — AB (ref 2.0–3.0)

## 2023-08-19 NOTE — Patient Instructions (Signed)
Description   Take 2 tablets today and then START taking 1 tablet daily EXCEPT 2 tablets on Mondays,  Wednesdays, Fridays.    Stay consistent with greens each week  Recheck INR in 1 week.  [facility] [phone]

## 2023-08-26 ENCOUNTER — Ambulatory Visit: Payer: Medicare Other | Attending: Cardiology

## 2023-08-26 DIAGNOSIS — G459 Transient cerebral ischemic attack, unspecified: Secondary | ICD-10-CM | POA: Diagnosis not present

## 2023-08-26 DIAGNOSIS — Z7901 Long term (current) use of anticoagulants: Secondary | ICD-10-CM | POA: Diagnosis not present

## 2023-08-26 LAB — POCT INR: INR: 2 (ref 2.0–3.0)

## 2023-08-26 NOTE — Patient Instructions (Signed)
Description   Take 1.5  tablets today and then continue taking 1 tablet daily EXCEPT 2 tablets on Mondays,  Wednesdays, Fridays.    Stay consistent with greens each week  Recheck INR in 3 weeks.  [facility] [phone]

## 2023-08-30 ENCOUNTER — Other Ambulatory Visit: Payer: Self-pay | Admitting: Adult Health

## 2023-08-30 DIAGNOSIS — F319 Bipolar disorder, unspecified: Secondary | ICD-10-CM

## 2023-09-11 ENCOUNTER — Encounter: Payer: Self-pay | Admitting: Adult Health

## 2023-09-11 ENCOUNTER — Other Ambulatory Visit: Payer: Self-pay | Admitting: Adult Health

## 2023-09-11 ENCOUNTER — Ambulatory Visit (INDEPENDENT_AMBULATORY_CARE_PROVIDER_SITE_OTHER): Payer: Medicare Other | Admitting: Adult Health

## 2023-09-11 DIAGNOSIS — F319 Bipolar disorder, unspecified: Secondary | ICD-10-CM

## 2023-09-11 MED ORDER — DIVALPROEX SODIUM ER 250 MG PO TB24: ORAL_TABLET | ORAL | 2 refills | Status: DC

## 2023-09-11 NOTE — Progress Notes (Signed)
 [name] [id number] 1956/06/20 68 y.o.  Subjective:   Patient ID:  [name] is a 68 y.o. (DOB [birth year]) female.  Chief Complaint: No chief complaint on file.   HPI [name] presents to the office today for follow-up of Bipolar Disorder.  Accompanied by husband - reporting some improvements.  Describes mood today as ok. Pleasant. Denies tearfulness. Mood symptoms - reports some depression. Denies anxiety and irritability. Denies panic attacks. Reports some worry. Denies rumination and over thinking. Denies mania. Mood has improved overall. Stating I feel like I'm doing better. Husband feels she is making progress, but is frustrated she is not doing better than she is. He feels prior to hospitalization, she was more outgoing - involved in various activities. Since hospitalization does not seem to have the interest and motivation she did. They feel like medications are helpful, but may need to be adjusted. Improved interest and motivation. Taking medications as prescribed. Energy levels improved. Active, does not have a regular exercise routine with physical limitations. Reports working with P/T - home health once a week. Reports going to silver sneakers twice last week.  Enjoys usual interests and activities. Married. Lives with husband. Spending time with family. Appetite adequate. Weight stable. Sleeps better some nights than others. Reports broken sleep. Reports some daytime napping - varies. Focus and concentration improving. Completing tasks. Managing minimal aspects of household. Retired. Denies SI or HI.  Denies AH or VH. Denies self harm. Denies substance use. Denies some paranoia.  Previous medication trials: Pristiq, Wellbutrin , Cymbalta , other medications   Flowsheet Row ED from 05/20/2022 in [facility] Emergency Department at [facility] ED to Hosp-Admission (Discharged) from 03/22/2021 in [place] 2C CV PROGRESSIVE CARE  C-SSRS RISK CATEGORY No  Risk No Risk        Review of Systems:  Review of Systems  Musculoskeletal:  Negative for gait problem.  Neurological:  Negative for tremors.  Psychiatric/Behavioral:         Please refer to HPI    Medications: I have reviewed the patient's current medications.  Current Outpatient Medications  Medication Sig Dispense Refill   acetaminophen  (TYLENOL ) 325 MG tablet Take 2 tablets (650 mg total) by mouth every 6 (six) hours as needed for mild pain or headache. 30 tablet    ARIPiprazole  (ABILIFY ) 5 MG tablet Take one and 1/2 tablets daily. 45 tablet 2   buPROPion  (WELLBUTRIN  XL) 150 MG 24 hr tablet Take 1 tablet by mouth daily. 90 tablet 0   buPROPion  (WELLBUTRIN  XL) 300 MG 24 hr tablet Take 1 tablet by mouth daily. 90 tablet 0   camphor-menthol  (SARNA) lotion Apply 1 application topically as needed for itching. 222 mL 0   divalproex  (DEPAKOTE  ER) 250 MG 24 hr tablet Take one tablet at bedtime. 30 tablet 2   docusate sodium  (COLACE) 100 MG capsule Take 200 mg by mouth daily.     Ferrous Sulfate  Dried (HIGH POTENCY IRON) 65 MG TABS Take 1 tablet by mouth daily.     folic acid  (FOLVITE ) 1 MG tablet Take 1 tablet (1 mg total) by mouth daily.     levonorgestrel (MIRENA) 20 MCG/DAY IUD 1 each by Intrauterine route once.     metoprolol  succinate (TOPROL -XL) 25 MG 24 hr tablet Take 2 tablets (50 mg total) by mouth in the morning AND 1 tablet (25 mg total) every evening. 360 tablet 0   oxybutynin  (DITROPAN -XL) 5 MG 24 hr tablet Take 5 mg by mouth every evening.  potassium chloride  (KLOR-CON ) 10 MEQ tablet Take 10 mEq by mouth 3 (three) times daily.     pravastatin  (PRAVACHOL ) 40 MG tablet Take 1 tablet (40 mg total) by mouth daily at 6 PM. 30 tablet 0   senna-docusate (SENOKOT-S) 8.6-50 MG tablet Take 1 tablet by mouth 2 (two) times daily.     torsemide  (DEMADEX ) 20 MG tablet Take 1 tablet (20 mg total) by mouth daily. Please take torsemide  20 mg twice daily if weight greater than 210lbs. 90  tablet 3   traZODone  (DESYREL ) 50 MG tablet Take one to two tablets at bedtime for sleep. 60 tablet 2   vitamin B-12 1000 MCG tablet Take 1 tablet (1,000 mcg total) by mouth daily.     warfarin (COUMADIN ) 1 MG tablet TAKE 1 TO 2 TABLETS BY MOUTH ONCE DAILY AS DIRECTED BY  COUMADIN   CLINIC 45 tablet 0   warfarin (COUMADIN ) 1 MG tablet TAKE 1 TO 1 & 1/2 (ONE TO ONE & ONE-HALF) TABLETS BY MOUTH ONCE DAILY AS  DIRECTED  BY  COUMADIN   CLINIC 100 tablet 1   No current facility-administered medications for this visit.    Medication Side Effects: None  Allergies:  Allergies  Allergen Reactions   Vancomycin  Itching    Patient can receive vancomycin  with slower infusion and prn benadryl  for itching  Patient can receive vancomycin  with slower infusion and prn benadryl  for itching     Patient can receive vancomycin  with slower infusion and prn benadryl  for itching    Past Medical History:  Diagnosis Date   Anemia 06/03/2019   Aortic stenosis 03/23/2013   Overview:  02/18/13 TTE EF >55%. Critical AS with mean Ao valve gradient of 82 mm Hg. No AI. No MR, PR, mild TR. Estimated RVSP 30 mm Hg.   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 Postmenopausal bleeding since for the past 2 years.  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 Last biopsy was in May of 2019 which showed weakly proliferative endometrium possibly a polyp.     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    Past Medical History, Surgical history, Social history, and Family history were reviewed and updated as appropriate.   Please see review of systems for further details on the patient's review from today.   Objective:   Physical Exam:  There were no vitals taken for this visit.  Physical Exam Constitutional:      General: She is not in  acute distress. Musculoskeletal:        General: No deformity.  Neurological:     Mental Status: She is alert and oriented to person, place, and time.     Coordination: Coordination normal.  Psychiatric:        Attention and Perception: Attention and perception normal. She does not perceive auditory or visual hallucinations.        Mood and Affect: Affect is not labile, blunt, angry or inappropriate.        Speech: Speech normal.        Behavior: Behavior normal.        Thought Content: Thought content normal. Thought content is not paranoid or delusional. Thought content does not include homicidal or suicidal ideation. Thought content does not include homicidal or suicidal plan.        Cognition and Memory: Cognition and memory normal.        Judgment: Judgment normal.     Comments: Insight intact     Lab Review:     Component Value Date/Time   NA 140 03/04/2023 1115   K 4.2 03/04/2023 1115   CL 100 03/04/2023 1115   CO2 25 03/04/2023 1115   GLUCOSE 103 (H) 03/04/2023 1115   GLUCOSE 88 03/31/2021 0042   BUN 12  03/04/2023 1115   CREATININE 1.05 (H) 03/04/2023 1115   CALCIUM 9.8 03/04/2023 1115   PROT 6.8 03/04/2023 1115   ALBUMIN 4.2 03/04/2023 1115   AST 14 03/04/2023 1115   ALT 9 03/04/2023 1115   ALKPHOS 54 03/04/2023 1115   BILITOT 0.4 03/04/2023 1115   GFRNONAA >60 03/31/2021 0042   GFRAA 74 06/06/2020 0903       Component Value Date/Time   WBC 9.8 03/04/2023 1115   WBC 13.4 (H) 04/02/2021 0008   RBC 4.77 03/04/2023 1115   RBC 5.45 (A) 08/07/2021 0000   HGB 14.4 03/04/2023 1115   HCT 45.2 03/04/2023 1115   PLT 181 03/04/2023 1115   MCV 95 03/04/2023 1115   MCV 88 08/07/2021 0000   MCH 30.2 03/04/2023 1115   MCH 27.9 04/02/2021 0008   MCHC 31.9 03/04/2023 1115   MCHC 30.4 04/02/2021 0008   RDW 14.0 03/04/2023 1115   LYMPHSABS 2.0 03/31/2021 0042   MONOABS 0.9 03/31/2021 0042   EOSABS 0.8 (H) 03/31/2021 0042   BASOSABS 0.1 03/31/2021 0042    No results found for: POCLITH, LITHIUM   No results found for: PHENYTOIN, PHENOBARB, VALPROATE, CBMZ   .res Assessment: Plan:    Plan:  PDMP reviewed  Decrease Depakote  ER 500mg  to 250mg  daily - mood stabilization  Abilify  7.5mg  - mood stabilization Trazadone 50mg  - 1 to 2 at hs Wellbutrin  XL 300mg  every morning - denies seizure history Wellbutrin  XL 150mg  every morning   Time spent with patient was minutes. Greater than 50% of face to face time with patient was spent on counseling and coordination of care.  Discussed need for P/T and being more active.  RTC 4 weeks  Patient advised to contact office with any questions, adverse effects, or acute worsening in signs and symptoms.  Discussed potential metabolic side effects associated with atypical antipsychotics, as well as potential risk for movement side effects. Advised pt to contact office if movement side effects occur.    Discussed potential benefits, risks, and side effects of Depakote  and need for period lab monitoring to assess for potential adverse  effects to include obtaining CBC and LFTs.   Diagnoses and all orders for  this visit:  Bipolar I disorder (HCC) -     divalproex  (DEPAKOTE  ER) 250 MG 24 hr tablet; Take one tablet at bedtime.     Please see After Visit Summary for patient specific instructions.  Future Appointments  Date Time Provider Department Center  09/16/2023  3:30 PM [facility]  [facility] None    No orders of the defined types were placed in this encounter.   -------------------------------

## 2023-09-16 ENCOUNTER — Ambulatory Visit: Payer: Medicare Other | Attending: Cardiology

## 2023-09-16 DIAGNOSIS — G459 Transient cerebral ischemic attack, unspecified: Secondary | ICD-10-CM

## 2023-09-16 DIAGNOSIS — Z7901 Long term (current) use of anticoagulants: Secondary | ICD-10-CM

## 2023-09-16 LAB — POCT INR: INR: 2 (ref 2.0–3.0)

## 2023-09-16 NOTE — Patient Instructions (Signed)
 Description   Take 1.5  tablets today and then continue taking 1 tablet daily EXCEPT 2 tablets on Mondays,  Wednesdays, Fridays.    Stay consistent with greens each week  Recheck INR in 4 weeks.  [facility] [phone]

## 2023-10-06 ENCOUNTER — Other Ambulatory Visit: Payer: Self-pay

## 2023-10-06 DIAGNOSIS — Z5181 Encounter for therapeutic drug level monitoring: Secondary | ICD-10-CM

## 2023-10-07 MED ORDER — WARFARIN SODIUM 1 MG PO TABS: ORAL_TABLET | ORAL | 1 refills | Status: DC

## 2023-10-09 ENCOUNTER — Telehealth: Payer: Medicare Other | Admitting: Adult Health

## 2023-10-09 ENCOUNTER — Encounter: Payer: Self-pay | Admitting: Adult Health

## 2023-10-09 DIAGNOSIS — F319 Bipolar disorder, unspecified: Secondary | ICD-10-CM | POA: Diagnosis not present

## 2023-10-09 NOTE — Progress Notes (Signed)
[name] [id number] 1955-09-19 68 y.o.  Virtual Visit via Video Note  I connected with pt @ on 10/09/23 at  1:30 PM EST by a video enabled telemedicine application and verified that I am speaking with the correct person using two identifiers.   I discussed the limitations of evaluation and management by telemedicine and the availability of in person appointments. The patient expressed understanding and agreed to proceed.  I discussed the assessment and treatment plan with the patient. The patient was provided an opportunity to ask questions and all were answered. The patient agreed with the plan and demonstrated an understanding of the instructions.   The patient was advised to call back or seek an in-person evaluation if the symptoms worsen or if the condition fails to improve as anticipated.  I provided 25 minutes of non-face-to-face time during this encounter.  The patient was located at home.  The provider was located at [facility].   Dorothyann Gibbs, NP   Subjective:   Patient ID:  [name] is a 68 y.o. (DOB 1956-07-11) female.  Chief Complaint: No chief complaint on file.   HPI [name] presents for follow-up of Bipolar Disorder.  Accompanied by husband.  Describes mood today as "ok". Pleasant. Denies tearfulness. Mood symptoms - denies depression and irritability. Reports improved interest and motivation. Reports anxiety. Denies panic attacks. Reports some worry and over thinking. Denies rumination. Denies mania. Reports mood as "about the same". Her husband reports a marked improvement off the Depakote. Taking medications as prescribed. Energy levels improved. Active, does not have a regular exercise routine with physical limitations. Reports working with P/T - home health once a week. Reports going to silver sneakers twice last week.  Enjoys usual interests and activities. Married. Lives with husband. Spending time with family. Appetite adequate.  Weight stable. Sleeps better some nights than others. Reports broken sleep. Reports some daytime napping - varies. Focus and concentration improving. Completing tasks. Managing minimal aspects of household. Retired. Denies SI or HI.  Denies AH or VH. Denies self harm. Denies substance use. Denies some paranoia.  Previous medication trials: Pristiq, Wellbutrin, Cymbalta, other medications  Review of Systems:  Review of Systems  Musculoskeletal:  Negative for gait problem.  Neurological:  Negative for tremors.  Psychiatric/Behavioral:         Please refer to HPI    Medications: I have reviewed the patient's current medications.  Current Outpatient Medications  Medication Sig Dispense Refill   acetaminophen (TYLENOL) 325 MG tablet Take 2 tablets (650 mg total) by mouth every 6 (six) hours as needed for mild pain or headache. 30 tablet    ARIPiprazole (ABILIFY) 5 MG tablet Take one and 1/2 tablets daily. 45 tablet 2   buPROPion (WELLBUTRIN XL) 150 MG 24 hr tablet Take 1 tablet by mouth daily. 90 tablet 0   buPROPion (WELLBUTRIN XL) 300 MG 24 hr tablet Take 1 tablet by mouth daily. 90 tablet 0   camphor-menthol (SARNA) lotion Apply 1 application topically as needed for itching. 222 mL 0   divalproex (DEPAKOTE ER) 250 MG 24 hr tablet Take 1 tablet by mouth at bedtime. 30 tablet 2   docusate sodium (COLACE) 100 MG capsule Take 200 mg by mouth daily.     Ferrous Sulfate Dried (HIGH POTENCY IRON) 65 MG TABS Take 1 tablet by mouth daily.     folic acid (FOLVITE) 1 MG tablet Take 1 tablet (1 mg total) by mouth daily.  levonorgestrel (MIRENA) 20 MCG/DAY IUD 1 each by Intrauterine route once.     metoprolol succinate (TOPROL-XL) 25 MG 24 hr tablet Take 2 tablets (50 mg total) by mouth in the morning AND 1 tablet (25 mg total) every evening. 360 tablet 0   oxybutynin (DITROPAN-XL) 5 MG 24 hr tablet Take 5 mg by mouth every evening.     potassium chloride (KLOR-CON) 10 MEQ tablet Take 10 mEq  by mouth 3 (three) times daily.     pravastatin (PRAVACHOL) 40 MG tablet Take 1 tablet (40 mg total) by mouth daily at 6 PM. 30 tablet 0   senna-docusate (SENOKOT-S) 8.6-50 MG tablet Take 1 tablet by mouth 2 (two) times daily.     torsemide (DEMADEX) 20 MG tablet Take 1 tablet (20 mg total) by mouth daily. Please take torsemide 20 mg twice daily if weight greater than 210lbs. 90 tablet 3   traZODone (DESYREL) 50 MG tablet Take one to two tablets at bedtime for sleep. 60 tablet 2   vitamin B-12 1000 MCG tablet Take 1 tablet (1,000 mcg total) by mouth daily.     warfarin (COUMADIN) 1 MG tablet TAKE 1 TO 2 TABLETS BY MOUTH ONCE DAILY AS DIRECTED BY  COUMADIN  CLINIC 45 tablet 0   warfarin (COUMADIN) 1 MG tablet TAKE 1 TO 1 & 1/2 (ONE TO ONE & ONE-HALF) TABLETS BY MOUTH ONCE DAILY AS  DIRECTED  BY  COUMADIN  CLINIC 100 tablet 1   No current facility-administered medications for this visit.    Medication Side Effects: None  Allergies:  Allergies  Allergen Reactions   Vancomycin Itching    Patient can receive vancomycin with slower infusion and prn benadryl for itching  Patient can receive vancomycin with slower infusion and prn benadryl for itching     Patient can receive vancomycin with slower infusion and prn benadryl for itching    Past Medical History:  Diagnosis Date   Anemia 06/03/2019   Aortic stenosis 03/23/2013   Overview:  02/18/13 TTE EF >55%. Critical AS with mean Ao valve gradient of 82 mm Hg. No AI. No MR, PR, mild TR. Estimated RVSP 30 mm Hg.   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 Postmenopausal bleeding since for the past 2 years.  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 Last biopsy was in May of 2019 which showed weakly proliferative endometrium possibly a polyp.     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  Family History  Problem Relation Age of Onset   Parkinson's disease Father    Heart attack Mother     Social History   Socioeconomic History   Marital status: Married    Spouse name: Not on file   Number of children: Not on file   Years of education: Not on file   Highest education level: Not on file  Occupational History   Not on file  Tobacco Use   Smoking status: Never   Smokeless tobacco: Never  Vaping Use   Vaping status: Never Used  Substance and Sexual Activity   Alcohol use: No   Drug use: No   Sexual activity: Not Currently  Other Topics Concern   Not on file  Social History Narrative   Not on file   Social Drivers of Health   Financial Resource Strain: Low Risk  (09/04/2022)   Received from [facility], [facility]   Overall Financial Resource Strain (CARDIA)    Difficulty of Paying Living Expenses: Not hard at all  Food Insecurity: Not on file  Transportation Needs: No Transportation Needs (09/06/2022)   Received from [facility], [facility]   PRAPARE -  Transportation    Lack of Transportation (Medical): No    Lack of Transportation (Non-Medical): No  Physical Activity: Not on file  Stress: No Stress Concern Present (09/04/2022)   Received from [facility], [facility] - Occupational Stress Questionnaire    Feeling of Stress : Only a little  Social Connections: Unknown (09/03/2022)   Received from  [facility], [facility]   Social Network    Social Network: Not on file  Intimate Partner Violence: Unknown (09/03/2022)   Received from [facility], [facility]   HITS    Physically Hurt: Not on file    Insult or Talk Down To: Not on file    Threaten Physical Harm: Not on file    Scream or Curse: Not on file    Past Medical History, Surgical history, Social history, and Family history were reviewed and updated as appropriate.   Please see review of systems for further details on the patient's review from today.   Objective:   Physical Exam:  There were no vitals taken for this visit.  Physical Exam Constitutional:      General: She is not in acute distress. Musculoskeletal:        General: No deformity.  Neurological:     Mental Status: She is alert and oriented to person, place, and time.     Coordination: Coordination normal.  Psychiatric:        Attention and Perception: Attention and perception normal. She does not perceive auditory or visual hallucinations.        Mood and Affect: Mood normal. Mood is not anxious or depressed. Affect is not labile, blunt, angry or inappropriate.        Speech: Speech normal.        Behavior: Behavior normal.        Thought Content: Thought content normal. Thought content is not paranoid or delusional. Thought content does not include homicidal or suicidal ideation. Thought content does not include homicidal or suicidal plan.        Cognition and Memory: Cognition and memory normal.        Judgment: Judgment normal.     Comments:  Insight intact     Lab Review:     Component Value Date/Time   NA 140  03/04/2023 1115   K 4.2 03/04/2023 1115   CL 100 03/04/2023 1115   CO2 25 03/04/2023 1115   GLUCOSE 103 (H) 03/04/2023 1115   GLUCOSE 88 03/31/2021 0042   BUN 12 03/04/2023 1115   CREATININE 1.05 (H) 03/04/2023 1115   CALCIUM 9.8 03/04/2023 1115   PROT 6.8 03/04/2023 1115   ALBUMIN 4.2 03/04/2023 1115   AST 14 03/04/2023 1115   ALT 9 03/04/2023 1115   ALKPHOS 54 03/04/2023 1115   BILITOT 0.4 03/04/2023 1115   GFRNONAA >60 03/31/2021 0042   GFRAA 74 06/06/2020 0903       Component Value Date/Time   WBC 9.8 03/04/2023 1115   WBC 13.4 (H) 04/02/2021 0008   RBC 4.77 03/04/2023 1115   RBC 5.45 (A) 08/07/2021 0000   HGB 14.4 03/04/2023 1115   HCT 45.2 03/04/2023 1115   PLT 181 03/04/2023 1115   MCV 95 03/04/2023 1115   MCV 88 08/07/2021 0000   MCH 30.2 03/04/2023 1115   MCH 27.9 04/02/2021 0008   MCHC 31.9 03/04/2023 1115   MCHC 30.4 04/02/2021 0008   RDW 14.0 03/04/2023 1115   LYMPHSABS 2.0 03/31/2021 0042   MONOABS 0.9 03/31/2021 0042   EOSABS 0.8 (H) 03/31/2021 0042   BASOSABS 0.1 03/31/2021 0042    No results found for: "POCLITH", "LITHIUM"   No results found for: "PHENYTOIN", "PHENOBARB", "VALPROATE", "CBMZ"   .res Assessment: Plan:    Plan:  PDMP reviewed  Discontinue Depakote ER 500mg  - was reduced to 250mg  daily, but did not see a difference with the reduction and discontinued it.   Decrease Abilify 7.5mg  to 5mg  - mood stabilization Trazadone 50mg  - 1 to 2 at hs Wellbutrin XL 300mg  every morning - denies seizure history Wellbutrin XL 150mg  every morning  25 minutes spent dedicated to the care of this patient on the date of this encounter to include pre-visit review of records, ordering of medication, post visit documentation, and face-to-face time with the patient discussing Bipolar disorder. Will continue current medication regimen.   RTC 4 weeks  Patient advised to contact  office with any questions, adverse effects, or acute worsening in signs and symptoms.  Discussed potential metabolic side effects associated with atypical antipsychotics, as well as potential risk for movement side effects. Advised pt to contact office if movement side effects occur.    Discussed potential benefits, risks, and side effects of Depakote and need for period lab monitoring to assess for potential adverse effects to include obtaining CBC and LFTs.   Diagnoses and all orders for this visit:  Bipolar I disorder (HCC)     Please see After Visit Summary for patient specific instructions.  Future Appointments  Date Time Provider Department Center  10/14/2023  2:00 PM [facility] None    No orders of the defined types were placed in this encounter.     -------------------------------

## 2023-10-14 ENCOUNTER — Ambulatory Visit: Payer: Medicare Other | Attending: Cardiology

## 2023-10-14 DIAGNOSIS — Z7901 Long term (current) use of anticoagulants: Secondary | ICD-10-CM | POA: Insufficient documentation

## 2023-10-14 DIAGNOSIS — G459 Transient cerebral ischemic attack, unspecified: Secondary | ICD-10-CM | POA: Insufficient documentation

## 2023-10-14 LAB — POCT INR: INR: 2.7 (ref 2.0–3.0)

## 2023-10-14 NOTE — Patient Instructions (Signed)
Description   Continue taking 1 tablet daily EXCEPT 2 tablets on Mondays, Wednesdays, Fridays.    Stay consistent with greens each week  Recheck INR in 5 weeks.  [facility] [phone]

## 2023-11-01 ENCOUNTER — Other Ambulatory Visit: Payer: Self-pay | Admitting: Cardiology

## 2023-11-01 ENCOUNTER — Other Ambulatory Visit: Payer: Self-pay | Admitting: Adult Health

## 2023-11-01 DIAGNOSIS — F319 Bipolar disorder, unspecified: Secondary | ICD-10-CM

## 2023-11-06 ENCOUNTER — Encounter: Payer: Self-pay | Admitting: Adult Health

## 2023-11-06 ENCOUNTER — Telehealth: Payer: Medicare Other | Admitting: Adult Health

## 2023-11-06 DIAGNOSIS — F319 Bipolar disorder, unspecified: Secondary | ICD-10-CM | POA: Diagnosis not present

## 2023-11-06 MED ORDER — CARIPRAZINE HCL 1.5 MG PO CAPS: 1.5000 mg | ORAL_CAPSULE | Freq: Every day | ORAL | 2 refills | Status: DC

## 2023-11-06 NOTE — Progress Notes (Signed)
 [name] [id number] 10-31-1955 68 y.o.  Virtual Visit via Video Note  I connected with pt @ on 11/06/23 at  2:00 PM EST by a video enabled telemedicine application and verified that I am speaking with the correct person using two identifiers.   I discussed the limitations of evaluation and management by telemedicine and the availability of in person appointments. The patient expressed understanding and agreed to proceed.  I discussed the assessment and treatment plan with the patient. The patient was provided an opportunity to ask questions and all were answered. The patient agreed with the plan and demonstrated an understanding of the instructions.   The patient was advised to call back or seek an in-person evaluation if the symptoms worsen or if the condition fails to improve as anticipated.  I provided 25 minutes of non-face-to-face time during this encounter.  The patient was located at home.  The provider was located at [facility].   Dorothyann Gibbs, NP   Subjective:   Patient ID:  [name] is a 68 y.o. (DOB 16-Jul-1956) female.  Chief Complaint: No chief complaint on file.   HPI [name] presents for follow-up of Bipolar Disorder.  Accompanied by husband.   Describes mood today as "ok". Pleasant. Denies tearfulness. Mood symptoms - reports depression at times - "the situation that I'm in". Reports reading, watching TV and talking to friends. Reports varying interest and motivation. Denies irritability. Reports anxiety at times. Denies panic attacks. Reports some worry and over thinking. Denies rumination. Denies mania. Reports mood is "about the same". Taking medications as prescribed. Energy levels about the same. Active, does not have a regular exercise routine with physical limitations. 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 Reports going to silver sneakers in over a month.  Enjoys usual interests and activities - "spending time with husband". Married. Lives  with husband. Spending time with family. Appetite adequate. Weight stable. Sleeps better some nights than others. Reports broken sleep. Reports some daytime napping. Focus and concentration decreased. Completing tasks. Managing minimal aspects of household. Retired. Denies SI or HI.  Denies AH or VH. Denies self harm. Denies substance use. Denies some paranoia.  Previous medication trials: Pristiq, Wellbutrin, Cymbalta, other medications   Review of Systems:  Review of Systems  Musculoskeletal:  Negative for gait problem.  Neurological:  Negative for tremors.  Psychiatric/Behavioral:         Please refer to HPI    Medications: I have reviewed the patient's current medications.  Current Outpatient Medications  Medication Sig Dispense Refill   acetaminophen (TYLENOL) 325 MG tablet Take 2 tablets (650 mg total) by mouth every 6 (six) hours as needed for mild pain or headache. 30 tablet    ARIPiprazole (ABILIFY) 5 MG tablet Take 1 and 1/2 tablets by mouth daily. 135 tablet 0   buPROPion (WELLBUTRIN XL) 150 MG 24 hr tablet Take 1 tablet by mouth daily. 90 tablet 0   buPROPion (WELLBUTRIN XL) 300 MG 24 hr tablet Take 1 tablet by mouth daily. 90 tablet 0   camphor-menthol (SARNA) lotion Apply 1 application topically as needed for itching. 222 mL 0   divalproex (DEPAKOTE ER) 250 MG 24 hr tablet Take 1 tablet by mouth at bedtime. 30 tablet 2   docusate sodium (COLACE) 100 MG capsule Take 200 mg by mouth daily.     Ferrous Sulfate Dried (HIGH POTENCY IRON) 65 MG TABS Take 1 tablet by mouth daily.     folic acid (FOLVITE) 1 MG  tablet Take 1 tablet (1 mg total) by mouth daily.     levonorgestrel (MIRENA) 20 MCG/DAY IUD 1 each by Intrauterine route once.     metoprolol succinate (TOPROL-XL) 25 MG 24 hr tablet Take 2 tablets (50 mg total) by mouth every morning AND 1 tablet (25 mg total) every evening. 270 tablet 0   oxybutynin (DITROPAN-XL) 5 MG 24 hr tablet Take 5 mg by mouth every evening.      potassium chloride (KLOR-CON) 10 MEQ tablet Take 10 mEq by mouth 3 (three) times daily.     pravastatin (PRAVACHOL) 40 MG tablet Take 1 tablet (40 mg total) by mouth daily at 6 PM. 30 tablet 0   senna-docusate (SENOKOT-S) 8.6-50 MG tablet Take 1 tablet by mouth 2 (two) times daily.     torsemide (DEMADEX) 20 MG tablet Take 1 tablet (20 mg total) by mouth daily. Please take torsemide 20 mg twice daily if weight greater than 210lbs. 90 tablet 3   traZODone (DESYREL) 50 MG tablet Take one to two tablets at bedtime for sleep. 60 tablet 2   vitamin B-12 1000 MCG tablet Take 1 tablet (1,000 mcg total) by mouth daily.     warfarin (COUMADIN) 1 MG tablet TAKE 1 TO 2 TABLETS BY MOUTH ONCE DAILY AS DIRECTED BY  COUMADIN  CLINIC 45 tablet 0   warfarin (COUMADIN) 1 MG tablet TAKE 1 TO 1 & 1/2 (ONE TO ONE & ONE-HALF) TABLETS BY MOUTH ONCE DAILY AS  DIRECTED  BY  COUMADIN  CLINIC 100 tablet 1   No current facility-administered medications for this visit.    Medication Side Effects: None  Allergies:  Allergies  Allergen Reactions   Vancomycin Itching    Patient can receive vancomycin with slower infusion and prn benadryl for itching  Patient can receive vancomycin with slower infusion and prn benadryl for itching     Patient can receive vancomycin with slower infusion and prn benadryl for itching    Past Medical History:  Diagnosis Date   Anemia 06/03/2019   Aortic stenosis 03/23/2013   Overview:  02/18/13 TTE EF >55%. Critical AS with mean Ao valve gradient of 82 mm Hg. No AI. No MR, PR, mild TR. Estimated RVSP 30 mm Hg.   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 Postmenopausal bleeding since for the past 2 years.  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 Last biopsy was in May of 2019 which showed weakly proliferative endometrium possibly a polyp.     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    Family History  Problem Relation Age of Onset   Parkinson's disease Father    Heart attack Mother     Social History   Socioeconomic History   Marital status: Married    Spouse name: Not on file   Number of children: Not on file   Years of education: Not on file   Highest education level: Not on file  Occupational History   Not on file  Tobacco Use   Smoking status: Never   Smokeless tobacco: Never  Vaping Use   Vaping status: Never Used  Substance and Sexual Activity   Alcohol use: No   Drug use: No   Sexual activity: Not Currently  Other Topics Concern   Not on file  Social History Narrative   Not on file   Social Drivers of Health   Financial Resource Strain: Low Risk  (09/04/2022)   Received from [facility], [facility]   Overall Financial Resource Strain (CARDIA)    Difficulty of Paying Living Expenses: Not hard at all  Food Insecurity: Not on file  Transportation Needs: No Transportation Needs  (09/06/2022)   Received from [facility], [facility]   PRAPARE - Transportation    Lack of Transportation (Medical): No    Lack of Transportation (Non-Medical): No  Physical Activity: Not on file  Stress: No Stress Concern Present (09/04/2022)   Received from [facility], [facility] - Occupational Stress Questionnaire    Feeling of Stress : Only a little  Social Connections: Unknown (09/03/2022)   Received from [facility], [facility]   Social Network    Social Network: Not on file  Intimate Partner Violence: Unknown (09/03/2022)   Received from [facility], [facility]   HITS    Physically Hurt: Not on file    Insult or Talk Down To: Not on file    Threaten Physical Harm: Not on file    Scream or Curse: Not on file    Past Medical History, Surgical history, Social history, and Family history were reviewed and updated as appropriate.   Please see review of systems for further details on the patient's review from today.   Objective:   Physical Exam:  There were no vitals taken for this visit.  Physical Exam Constitutional:      General: She is not in acute distress. Musculoskeletal:        General: No deformity.  Neurological:     Mental Status: She is alert and oriented to person, place, and time.     Coordination: Coordination normal.  Psychiatric:        Attention and Perception: Attention and perception normal. She does not perceive auditory or visual hallucinations.        Mood and Affect: Affect is not labile, blunt, angry or inappropriate.        Speech: Speech normal.        Behavior: Behavior normal.        Thought Content: Thought content normal. Thought content is not paranoid or delusional. Thought content does not include homicidal or suicidal ideation. Thought content does not include homicidal or suicidal plan.        Cognition and Memory: Cognition and memory normal.        Judgment: Judgment  normal.     Comments: Insight intact     Lab Review:     Component  Value Date/Time   NA 140 03/04/2023 1115   K 4.2 03/04/2023 1115   CL 100 03/04/2023 1115   CO2 25 03/04/2023 1115   GLUCOSE 103 (H) 03/04/2023 1115   GLUCOSE 88 03/31/2021 0042   BUN 12 03/04/2023 1115   CREATININE 1.05 (H) 03/04/2023 1115   CALCIUM 9.8 03/04/2023 1115   PROT 6.8 03/04/2023 1115   ALBUMIN 4.2 03/04/2023 1115   AST 14 03/04/2023 1115   ALT 9 03/04/2023 1115   ALKPHOS 54 03/04/2023 1115   BILITOT 0.4 03/04/2023 1115   GFRNONAA >60 03/31/2021 0042   GFRAA 74 06/06/2020 0903       Component Value Date/Time   WBC 9.8 03/04/2023 1115   WBC 13.4 (H) 04/02/2021 0008   RBC 4.77 03/04/2023 1115   RBC 5.45 (A) 08/07/2021 0000   HGB 14.4 03/04/2023 1115   HCT 45.2 03/04/2023 1115   PLT 181 03/04/2023 1115   MCV 95 03/04/2023 1115   MCV 88 08/07/2021 0000   MCH 30.2 03/04/2023 1115   MCH 27.9 04/02/2021 0008   MCHC 31.9 03/04/2023 1115   MCHC 30.4 04/02/2021 0008   RDW 14.0 03/04/2023 1115   LYMPHSABS 2.0 03/31/2021 0042   MONOABS 0.9 03/31/2021 0042   EOSABS 0.8 (H) 03/31/2021 0042   BASOSABS 0.1 03/31/2021 0042    No results found for: "POCLITH", "LITHIUM"   No results found for: "PHENYTOIN", "PHENOBARB", "VALPROATE", "CBMZ"   .res Assessment: Plan:    Plan:  PDMP reviewed  Discontinued Depakote ER 500mg    Decrease Abilify 5mg  to 2.5mg  daily x 7 days, then d/c - mood stabilization  Add Vraylar 1.5mg  daily for mood symptoms  Trazadone 50mg  - 1 to 2 at hs Wellbutrin XL 300mg  every morning - denies seizure history Wellbutrin XL 150mg  every morning  Discussed need for activitiy. Patient commits to going to silver sneakers 3 days a week to work on her mobility.   RTC 4 weeks  25 minutes spent dedicated to the care of this patient on the date of this encounter to include pre-visit review of records, ordering of medication, post visit documentation, and face-to-face time with  the patient discussing Bipolar disorder. Discussed continuing current medication regimen.  Patient advised to contact office with any questions, adverse effects, or acute worsening in signs and symptoms.  Discussed potential metabolic side effects associated with atypical antipsychotics, as well as potential risk for movement side effects. Advised pt to contact office if movement side effects occur.    Discussed potential benefits, risks, and side effects of Depakote and need for period lab monitoring to assess for potential adverse effects to include obtaining CBC and LFTs.   There are no diagnoses linked to this encounter.   Please see After Visit Summary for patient specific instructions.  Future Appointments  Date Time Provider Department Center  11/06/2023  2:00 PM Cai Anfinson, Thereasa Solo, NP [facility] None  11/18/2023  2:00 PM [facility] None    No orders of the defined types were placed in this encounter.     -------------------------------

## 2023-11-15 ENCOUNTER — Other Ambulatory Visit: Payer: Self-pay | Admitting: Adult Health

## 2023-11-15 DIAGNOSIS — F319 Bipolar disorder, unspecified: Secondary | ICD-10-CM

## 2023-11-17 ENCOUNTER — Telehealth: Payer: Self-pay | Admitting: Adult Health

## 2023-11-17 NOTE — Telephone Encounter (Signed)
 pending

## 2023-11-17 NOTE — Telephone Encounter (Signed)
 Patient's husband called in stating that [name] needs a PA for [employer]. 1.5mg . PH: [phone] Appt 3/27

## 2023-11-18 ENCOUNTER — Ambulatory Visit: Payer: Medicare Other | Attending: Cardiology

## 2023-11-18 DIAGNOSIS — G459 Transient cerebral ischemic attack, unspecified: Secondary | ICD-10-CM | POA: Insufficient documentation

## 2023-11-18 DIAGNOSIS — Z7901 Long term (current) use of anticoagulants: Secondary | ICD-10-CM | POA: Diagnosis not present

## 2023-11-18 LAB — POCT INR: INR: 2.9 (ref 2.0–3.0)

## 2023-11-18 NOTE — Patient Instructions (Signed)
 Description   Continue taking 1 tablet daily EXCEPT 2 tablets on Mondays, Wednesdays, Fridays.    Stay consistent with greens each week  Recheck INR in 6 weeks.  [facility] [phone]

## 2023-11-18 NOTE — Telephone Encounter (Signed)
 Prior approval effective 11/17/23-11/17/26 for [employer]

## 2023-11-19 ENCOUNTER — Other Ambulatory Visit: Payer: Self-pay | Admitting: Adult Health

## 2023-11-19 DIAGNOSIS — F319 Bipolar disorder, unspecified: Secondary | ICD-10-CM

## 2023-11-23 ENCOUNTER — Other Ambulatory Visit: Payer: Self-pay | Admitting: Adult Health

## 2023-11-23 DIAGNOSIS — F319 Bipolar disorder, unspecified: Secondary | ICD-10-CM

## 2023-11-30 ENCOUNTER — Other Ambulatory Visit: Payer: Self-pay | Admitting: Adult Health

## 2023-11-30 DIAGNOSIS — F319 Bipolar disorder, unspecified: Secondary | ICD-10-CM

## 2023-12-04 ENCOUNTER — Encounter: Payer: Self-pay | Admitting: Adult Health

## 2023-12-04 ENCOUNTER — Telehealth (INDEPENDENT_AMBULATORY_CARE_PROVIDER_SITE_OTHER): Payer: Medicare Other | Admitting: Adult Health

## 2023-12-04 DIAGNOSIS — F319 Bipolar disorder, unspecified: Secondary | ICD-10-CM

## 2023-12-04 NOTE — Progress Notes (Signed)
 [name] [id number] Jul 12, 1956 68 y.o.  Virtual Visit via Video Note  I connected with pt @ on 12/04/23 at  2:00 PM EDT by a video enabled telemedicine application and verified that I am speaking with the correct person using two identifiers.   I discussed the limitations of evaluation and management by telemedicine and the availability of in person appointments. The patient expressed understanding and agreed to proceed.  I discussed the assessment and treatment plan with the patient. The patient was provided an opportunity to ask questions and all were answered. The patient agreed with the plan and demonstrated an understanding of the instructions.   The patient was advised to call back or seek an in-person evaluation if the symptoms worsen or if the condition fails to improve as anticipated.  I provided 25 minutes of non-face-to-face time during this encounter.  The patient was located at home.  The provider was located at [facility].   Dorothyann Gibbs, NP   Subjective:   Patient ID:  [name] is a 68 y.o. (DOB March 02, 1956) female.  Chief Complaint: No chief complaint on file.   HPI [name] presents for follow-up of Bipolar Disorder.  Accompanied by husband.   Describes mood today as "ok". Pleasant. Denies tearfulness. Mood symptoms - reports depression "some, but not constant". Reports varying interest and motivation. Denies anxiety and irritability. Denies panic attacks. Reports some worry and over thinking. Denies rumination. Reports reading, watching TV and talking to friends. Denies irritability. Reports anxiety at times. Denies panic attacks. Denies mania. Reports mood is "about the same". Stating "I feel a little better". Reports starting Vraylar 1.5mg  daily 7 days ago. Taking medications as prescribed. Energy levels about the same. Active, does not have a regular exercise routine with physical limitations. 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 Reports going  to silver sneakers once a week and plans to increase to twice a week.  Enjoys usual interests and activities - "spending time with husband". Married. Lives with husband. Spending time with family. Appetite adequate. Weight stable. Sleeps better some nights than others - reports broken sleep - sleeping in recliner. Reports some daytime napping. Focus and concentration improved - doing puzzles - more than she used too. Completing tasks. Managing minimal aspects of household. Retired. Denies SI or HI.  Denies AH or VH. Denies self harm. Denies substance use. Denies paranoia.  Previous medication trials: Pristiq, Wellbutrin, Cymbalta, other medications   Review of Systems:  Review of Systems  Musculoskeletal:  Negative for gait problem.  Neurological:  Negative for tremors.  Psychiatric/Behavioral:         Please refer to HPI    Medications: I have reviewed the patient's current medications.  Current Outpatient Medications  Medication Sig Dispense Refill   acetaminophen (TYLENOL) 325 MG tablet Take 2 tablets (650 mg total) by mouth every 6 (six) hours as needed for mild pain or headache. 30 tablet    buPROPion (WELLBUTRIN XL) 150 MG 24 hr tablet Take 1 tablet by mouth daily. 90 tablet 0   buPROPion (WELLBUTRIN XL) 300 MG 24 hr tablet Take 1 tablet by mouth daily. 90 tablet 0   camphor-menthol (SARNA) lotion Apply 1 application topically as needed for itching. 222 mL 0   cariprazine (VRAYLAR) 1.5 MG capsule Take 1 capsule (1.5 mg total) by mouth daily. 30 capsule 2   docusate sodium (COLACE) 100 MG capsule Take 200 mg by mouth daily.     Ferrous Sulfate Dried (HIGH POTENCY IRON)  65 MG TABS Take 1 tablet by mouth daily.     folic acid (FOLVITE) 1 MG tablet Take 1 tablet (1 mg total) by mouth daily.     levonorgestrel (MIRENA) 20 MCG/DAY IUD 1 each by Intrauterine route once.     metoprolol succinate (TOPROL-XL) 25 MG 24 hr tablet Take 2 tablets (50 mg total) by mouth every morning AND 1  tablet (25 mg total) every evening. 270 tablet 0   oxybutynin (DITROPAN-XL) 5 MG 24 hr tablet Take 5 mg by mouth every evening.     potassium chloride (KLOR-CON) 10 MEQ tablet Take 10 mEq by mouth 3 (three) times daily.     pravastatin (PRAVACHOL) 40 MG tablet Take 1 tablet (40 mg total) by mouth daily at 6 PM. 30 tablet 0   senna-docusate (SENOKOT-S) 8.6-50 MG tablet Take 1 tablet by mouth 2 (two) times daily.     torsemide (DEMADEX) 20 MG tablet Take 1 tablet (20 mg total) by mouth daily. Please take torsemide 20 mg twice daily if weight greater than 210lbs. 90 tablet 3   traZODone (DESYREL) 50 MG tablet Take 1 to 2 tablets by mouth at bedtime for sleep. 60 tablet 0   vitamin B-12 1000 MCG tablet Take 1 tablet (1,000 mcg total) by mouth daily.     warfarin (COUMADIN) 1 MG tablet TAKE 1 TO 2 TABLETS BY MOUTH ONCE DAILY AS DIRECTED BY  COUMADIN  CLINIC 45 tablet 0   warfarin (COUMADIN) 1 MG tablet TAKE 1 TO 1 & 1/2 (ONE TO ONE & ONE-HALF) TABLETS BY MOUTH ONCE DAILY AS  DIRECTED  BY  COUMADIN  CLINIC 100 tablet 1   No current facility-administered medications for this visit.    Medication Side Effects: None  Allergies:  Allergies  Allergen Reactions   Vancomycin Itching    Patient can receive vancomycin with slower infusion and prn benadryl for itching  Patient can receive vancomycin with slower infusion and prn benadryl for itching     Patient can receive vancomycin with slower infusion and prn benadryl for itching    Past Medical History:  Diagnosis Date   Anemia 06/03/2019   Aortic stenosis 03/23/2013   Overview:  02/18/13 TTE EF >55%. Critical AS with mean Ao valve gradient of 82 mm Hg. No AI. No MR, PR, mild TR. Estimated RVSP 30 mm Hg.   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 Postmenopausal bleeding since for the past 2 years.  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 Last biopsy  was in May of 2019 which showed weakly proliferative endometrium possibly a polyp.     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    Family History  Problem Relation Age of Onset   Parkinson's disease Father    Heart attack Mother     Social History   Socioeconomic History   Marital status: Married    Spouse name: Not on file   Number of children: Not on file   Years of education: Not on file   Highest education level: Not on file  Occupational History   Not on file  Tobacco Use   Smoking status: Never   Smokeless tobacco: Never  Vaping Use   Vaping status: Never Used  Substance and Sexual Activity   Alcohol use: No   Drug use: No   Sexual activity: Not Currently  Other Topics Concern   Not on file  Social History Narrative   Not on file   Social Drivers of Health   Financial Resource Strain: Low Risk  (09/04/2022)   Received from [facility], [facility]   Overall Financial Resource Strain (CARDIA)    Difficulty  of Paying Living Expenses: Not hard at all  Food Insecurity: Not on file  Transportation Needs: No Transportation Needs (09/06/2022)   Received from [facility], [facility]   PRAPARE - Transportation    Lack of Transportation (Medical): No    Lack of Transportation (Non-Medical): No  Physical Activity: Not on file  Stress: No Stress Concern Present (09/04/2022)   Received from [facility], [facility] - Occupational Stress Questionnaire    Feeling of Stress : Only a little  Social Connections: Unknown (09/03/2022)   Received from [facility], [facility]   Social Network    Social Network: Not on file  Intimate Partner Violence: Unknown (09/03/2022)   Received from [facility], [facility]   HITS    Physically Hurt: Not on file    Insult or Talk Down To: Not on file    Threaten Physical Harm: Not on file    Scream or Curse: Not on file    Past Medical History, Surgical history, Social history, and Family history were reviewed and updated as appropriate.   Please see review of systems for further details on the patient's review from today.   Objective:   Physical Exam:  There were no vitals taken for this visit.  Physical Exam Constitutional:      General: She is not in acute distress. Musculoskeletal:        General: No deformity.  Neurological:     Mental Status: She is alert and oriented to person, place, and time.     Coordination: Coordination normal.  Psychiatric:        Attention and Perception: Attention and perception normal. She does not perceive auditory or visual hallucinations.        Mood and Affect: Affect is not labile, blunt, angry or inappropriate.        Speech: Speech normal.        Behavior: Behavior normal.        Thought Content: Thought content normal. Thought content is not paranoid or delusional. Thought content does not include homicidal or suicidal ideation. Thought content does not  include homicidal or suicidal plan.        Cognition and Memory: Cognition and memory normal.  Judgment: Judgment normal.     Comments: Insight intact     Lab Review:     Component Value Date/Time   NA 140 03/04/2023 1115   K 4.2 03/04/2023 1115   CL 100 03/04/2023 1115   CO2 25 03/04/2023 1115   GLUCOSE 103 (H) 03/04/2023 1115   GLUCOSE 88 03/31/2021 0042   BUN 12 03/04/2023 1115   CREATININE 1.05 (H) 03/04/2023 1115   CALCIUM 9.8 03/04/2023 1115   PROT 6.8 03/04/2023 1115   ALBUMIN 4.2 03/04/2023 1115   AST 14 03/04/2023 1115   ALT 9 03/04/2023 1115   ALKPHOS 54 03/04/2023 1115   BILITOT 0.4 03/04/2023 1115   GFRNONAA >60 03/31/2021 0042   GFRAA 74 06/06/2020 0903       Component Value Date/Time   WBC 9.8 03/04/2023 1115   WBC 13.4 (H) 04/02/2021 0008   RBC 4.77 03/04/2023 1115   RBC 5.45 (A) 08/07/2021 0000   HGB 14.4 03/04/2023 1115   HCT 45.2 03/04/2023 1115   PLT 181 03/04/2023 1115   MCV 95 03/04/2023 1115   MCV 88 08/07/2021 0000   MCH 30.2 03/04/2023 1115   MCH 27.9 04/02/2021 0008   MCHC 31.9 03/04/2023 1115   MCHC 30.4 04/02/2021 0008   RDW 14.0 03/04/2023 1115   LYMPHSABS 2.0 03/31/2021 0042   MONOABS 0.9 03/31/2021 0042   EOSABS 0.8 (H) 03/31/2021 0042   BASOSABS 0.1 03/31/2021 0042    No results found for: "POCLITH", "LITHIUM"   No results found for: "PHENYTOIN", "PHENOBARB", "VALPROATE", "CBMZ"   .res Assessment: Plan:    Plan:  PDMP reviewed  Added Vraylar 1.5mg  daily for mood symptoms one week ago. Will see patient again in 3 weeks to see if any progress noted. Patient also agrees to start going to silver sneakers 2 days a week. Advised to be as active as she can.  Trazadone 50mg  - 1 to 2 at hs Wellbutrin XL 300mg  every morning - denies seizure history Wellbutrin XL 150mg  every morning  Discussed need for activitiy. Patient commits to going to silver sneakers 3 days a week to work on her mobility.   RTC 4 weeks  25 minutes  spent dedicated to the care of this patient on the date of this encounter to include pre-visit review of records, ordering of medication, post visit documentation, and face-to-face time with the patient discussing Bipolar disorder. Discussed continuing current medication regimen.  Patient advised to contact office with any questions, adverse effects, or acute worsening in signs and symptoms.  Discussed potential metabolic side effects associated with atypical antipsychotics, as well as potential risk for movement side effects. Advised pt to contact office if movement side effects occur.    Discussed potential benefits, risks, and side effects of Depakote and need for period lab monitoring to assess for potential adverse effects to include obtaining CBC and LFTs.    Diagnoses and all orders for this visit:  Bipolar I disorder (HCC)     Please see After Visit Summary for patient specific instructions.  Future Appointments  Date Time Provider Department Center  12/30/2023  2:00 PM [facility] None  12/31/2023  4:30 PM Rennie Rouch, Thereasa Solo, NP [facility] None    No orders of the defined types were placed in this encounter.     -------------------------------

## 2023-12-08 ENCOUNTER — Telehealth: Payer: Self-pay | Admitting: Cardiology

## 2023-12-08 DIAGNOSIS — Z5181 Encounter for therapeutic drug level monitoring: Secondary | ICD-10-CM

## 2023-12-08 MED ORDER — WARFARIN SODIUM 1 MG PO TABS: ORAL_TABLET | ORAL | 1 refills | Status: DC

## 2023-12-08 NOTE — Telephone Encounter (Signed)
 Refill request for warfarin:  Last INR was 2.9 on 11/18/23 Next INR due 12/30/23 LOV was 03/04/23  Refill approved.

## 2023-12-08 NOTE — Telephone Encounter (Signed)
*  STAT* If patient is at the pharmacy, call can be transferred to refill team.   1. Which medications need to be refilled? (please list name of each medication and dose if known)  warfarin (COUMADIN) 1 MG tablet  2. Which pharmacy/location (including street and city if local pharmacy) is medication to be sent to? [email or web address] - [facility] - [place] - [address]   3. Do they need a 30 day or 90 day supply?   Patient unsure. She says current Rx is written for 100 tablets and she takes 10 tablets weekly, which wouldn't be enough for 3 months. Patient unsure how prescription needs to be written. May have been for insurance purposes or [employer]. Instructions vary. Please clarify if possible.

## 2023-12-30 ENCOUNTER — Ambulatory Visit: Attending: Cardiology

## 2023-12-30 DIAGNOSIS — G459 Transient cerebral ischemic attack, unspecified: Secondary | ICD-10-CM | POA: Diagnosis present

## 2023-12-30 DIAGNOSIS — Z7901 Long term (current) use of anticoagulants: Secondary | ICD-10-CM | POA: Diagnosis present

## 2023-12-30 LAB — POCT INR: INR: 3.3 — AB (ref 2.0–3.0)

## 2023-12-30 NOTE — Patient Instructions (Signed)
 Description   HOLD today's dose and then Continue taking 1 tablet daily EXCEPT 2 tablets on Mondays, Wednesdays, Fridays.    Stay consistent with greens each week  Recheck INR in 5 weeks.  Coumadin  Clinic [phone]

## 2023-12-31 ENCOUNTER — Encounter: Payer: Self-pay | Admitting: Adult Health

## 2023-12-31 ENCOUNTER — Telehealth: Admitting: Adult Health

## 2023-12-31 DIAGNOSIS — F319 Bipolar disorder, unspecified: Secondary | ICD-10-CM

## 2023-12-31 NOTE — Progress Notes (Signed)
 [name] [id number] [birth year] 68 y.o.  Virtual Visit via Video Note  I connected with pt @ on 12/31/23 at  4:30 PM EDT by a video enabled telemedicine application and verified that I am speaking with the correct person using two identifiers.   I discussed the limitations of evaluation and management by telemedicine and the availability of in person appointments. The patient expressed understanding and agreed to proceed.  I discussed the assessment and treatment plan with the patient. The patient was provided an opportunity to ask questions and all were answered. The patient agreed with the plan and demonstrated an understanding of the instructions.   The patient was advised to call back or seek an in-person evaluation if the symptoms worsen or if the condition fails to improve as anticipated.  I provided 30 minutes of non-face-to-face time during this encounter.  The patient was located at home.  The provider was located at [facility].   Reagan Camera, NP   Subjective:   Patient ID:  [name] is a 68 y.o. (DOB [birth year]) female.  Chief Complaint: No chief complaint on file.   HPI [name] presents for follow-up of Bipolar Disorder.  Describes mood today as "ok". Pleasant. Denies tearfulness. Mood symptoms - denies depression and irritability. Reports anxiety at times - "it's more here and there". Reports varying interest and motivation - "maybe wanting to do a little more". Denies panic attacks. Reports some worry and over thinking. Denies rumination. Denies mania. Reports mood as "stable - not one extreme or the other". Reports taking medications as prescribed - uncertain if Vraylar  has been helpful. Reports she is willing to consider other options. Energy levels about the same. Active, does not have a regular exercise routine with physical limitations. Reports going to silver sneakers a few more times this past month than previously. Enjoys usual  interests and activities - "spending time with husband". Reports reading - "our state magazine", doing quizzes on Facebook, watching TV and talking to friends. Married. Lives with husband.  Has a son and a daughter. Spending time with family. Appetite adequate. Weight stable. Sleeps better some nights than others - reports broken sleep - sleeping in recliner. Reports some daytime napping. Focus and concentration better - doing puzzles, quizzes and math problems. Completing tasks. Managing minimal aspects of household. Retired. Denies SI or HI.  Denies AH or VH. Denies self harm. Denies substance use. Denies paranoia.  Previous medication trials: Pristiq, Wellbutrin , Cymbalta , other medications  Review of Systems:  Review of Systems  Musculoskeletal:  Negative for gait problem.  Neurological:  Negative for tremors.  Psychiatric/Behavioral:         Please refer to HPI    Medications: I have reviewed the patient's current medications.  Current Outpatient Medications  Medication Sig Dispense Refill   acetaminophen  (TYLENOL ) 325 MG tablet Take 2 tablets (650 mg total) by mouth every 6 (six) hours as needed for mild pain or headache. 30 tablet    buPROPion  (WELLBUTRIN  XL) 150 MG 24 hr tablet Take 1 tablet by mouth daily. 90 tablet 0   buPROPion  (WELLBUTRIN  XL) 300 MG 24 hr tablet Take 1 tablet by mouth daily. 90 tablet 0   camphor-menthol  (SARNA) lotion Apply 1 application topically as needed for itching. 222 mL 0   cariprazine  (VRAYLAR ) 1.5 MG capsule Take 1 capsule (1.5 mg total) by mouth daily. 30 capsule 2   docusate sodium  (COLACE) 100 MG capsule Take 200 mg by mouth daily.  Ferrous Sulfate  Dried (HIGH POTENCY IRON) 65 MG TABS Take 1 tablet by mouth daily.     folic acid  (FOLVITE ) 1 MG tablet Take 1 tablet (1 mg total) by mouth daily.     levonorgestrel (MIRENA) 20 MCG/DAY IUD 1 each by Intrauterine route once.     metoprolol  succinate (TOPROL -XL) 25 MG 24 hr tablet Take 2 tablets  (50 mg total) by mouth every morning AND 1 tablet (25 mg total) every evening. 270 tablet 0   oxybutynin  (DITROPAN -XL) 5 MG 24 hr tablet Take 5 mg by mouth every evening.     potassium chloride  (KLOR-CON ) 10 MEQ tablet Take 10 mEq by mouth 3 (three) times daily.     pravastatin  (PRAVACHOL ) 40 MG tablet Take 1 tablet (40 mg total) by mouth daily at 6 PM. 30 tablet 0   senna-docusate (SENOKOT-S) 8.6-50 MG tablet Take 1 tablet by mouth 2 (two) times daily.     torsemide  (DEMADEX ) 20 MG tablet Take 1 tablet (20 mg total) by mouth daily. Please take torsemide  20 mg twice daily if weight greater than 210lbs. 90 tablet 3   traZODone  (DESYREL ) 50 MG tablet Take 1 to 2 tablets by mouth at bedtime for sleep. 60 tablet 0   vitamin B-12 1000 MCG tablet Take 1 tablet (1,000 mcg total) by mouth daily.     warfarin (COUMADIN ) 1 MG tablet TAKE 1 TO 2 TABLETS BY MOUTH ONCE DAILY AS DIRECTED BY  COUMADIN   CLINIC 45 tablet 0   warfarin (COUMADIN ) 1 MG tablet TAKE 1 TO 2 (ONE TO TWO) TABLETS BY MOUTH ONCE DAILY AS  DIRECTED  BY  COUMADIN   CLINIC 145 tablet 1   No current facility-administered medications for this visit.    Medication Side Effects: None  Allergies:  Allergies  Allergen Reactions   Vancomycin  Itching    Patient can receive vancomycin  with slower infusion and prn benadryl  for itching  Patient can receive vancomycin  with slower infusion and prn benadryl  for itching     Patient can receive vancomycin  with slower infusion and prn benadryl  for itching    Past Medical History:  Diagnosis Date   Anemia 06/03/2019   Aortic stenosis 03/23/2013   Overview:  02/18/13 TTE EF >55%. Critical AS with mean Ao valve gradient of 82 mm Hg. No AI. No MR, PR, mild TR. Estimated RVSP 30 mm Hg.   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 Postmenopausal bleeding since for the past 2 years.  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  Last biopsy was in May of 2019 which showed weakly proliferative endometrium possibly a polyp.     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    Family History  Problem Relation Age of Onset   Parkinson's disease Father    Heart attack Mother     Social History   Socioeconomic History   Marital status: Married    Spouse name: Not on file   Number of children: Not on file   Years of education: Not on file   Highest education level: Not on file  Occupational History   Not on file  Tobacco Use   Smoking status: Never   Smokeless tobacco: Never  Vaping Use   Vaping status: Never Used  Substance and Sexual Activity   Alcohol use: No   Drug use: No   Sexual activity: Not Currently  Other Topics Concern   Not on file  Social History Narrative   Not on file   Social Drivers of Health   Financial Resource Strain: Low Risk  (09/04/2022)   [facility], [facility]   Overall Financial Resource Strain  (CARDIA)    Difficulty of Paying Living Expenses: Not hard at all  Food Insecurity: Not on file  Transportation Needs: No Transportation Needs (09/06/2022)   Received from [facility], [facility]   PRAPARE - Transportation    Lack of Transportation (Medical): No    Lack of Transportation (Non-Medical): No  Physical Activity: Not on file  Stress: No Stress Concern Present (09/04/2022)   Received from [facility], [facility] - Occupational Stress Questionnaire    Feeling of Stress : Only a little  Social Connections: Unknown (09/03/2022)   Received from [facility], [facility]   Social Network    Social Network: Not on file  Intimate Partner Violence: Unknown (09/03/2022)   Received from [facility], [facility]   HITS    Physically Hurt: Not on file    Insult or Talk Down To: Not on file    Threaten Physical Harm: Not on file    Scream or Curse: Not on file    Past Medical History, Surgical history, Social history, and Family history were reviewed and updated as appropriate.   Please see review of systems for further details on the patient's review from today.   Objective:   Physical Exam:  There were no vitals taken for this visit.  Physical Exam Constitutional:      General: She is not in acute distress. Musculoskeletal:        General: No deformity.  Neurological:     Mental Status: She is alert and oriented to person, place, and time.     Coordination: Coordination normal.  Psychiatric:        Attention and Perception: Attention and perception normal. She does not perceive auditory or visual hallucinations.        Mood and Affect: Mood normal. Mood is not anxious or depressed. Affect is not labile, blunt, angry or inappropriate.        Speech: Speech normal.        Behavior: Behavior normal.        Thought Content: Thought content normal. Thought content is not paranoid or delusional. Thought content does  not include homicidal or suicidal ideation. Thought content does not include homicidal or suicidal plan.        Cognition and Memory:  Cognition and memory normal.        Judgment: Judgment normal.     Comments: Insight intact     Lab Review:     Component Value Date/Time   NA 140 03/04/2023 1115   K 4.2 03/04/2023 1115   CL 100 03/04/2023 1115   CO2 25 03/04/2023 1115   GLUCOSE 103 (H) 03/04/2023 1115   GLUCOSE 88 03/31/2021 0042   BUN 12 03/04/2023 1115   CREATININE 1.05 (H) 03/04/2023 1115   CALCIUM 9.8 03/04/2023 1115   PROT 6.8 03/04/2023 1115   ALBUMIN 4.2 03/04/2023 1115   AST 14 03/04/2023 1115   ALT 9 03/04/2023 1115   ALKPHOS 54 03/04/2023 1115   BILITOT 0.4 03/04/2023 1115   GFRNONAA >60 03/31/2021 0042   GFRAA 74 06/06/2020 0903       Component Value Date/Time   WBC 9.8 03/04/2023 1115   WBC 13.4 (H) 04/02/2021 0008   RBC 4.77 03/04/2023 1115   RBC 5.45 (A) 08/07/2021 0000   HGB 14.4 03/04/2023 1115   HCT 45.2 03/04/2023 1115   PLT 181 03/04/2023 1115   MCV 95 03/04/2023 1115   MCV 88 08/07/2021 0000   MCH 30.2 03/04/2023 1115   MCH 27.9 04/02/2021 0008   MCHC 31.9 03/04/2023 1115   MCHC 30.4 04/02/2021 0008   RDW 14.0 03/04/2023 1115   LYMPHSABS 2.0 03/31/2021 0042   MONOABS 0.9 03/31/2021 0042   EOSABS 0.8 (H) 03/31/2021 0042   BASOSABS 0.1 03/31/2021 0042    No results found for: "POCLITH", "LITHIUM"   No results found for: "PHENYTOIN", "PHENOBARB", "VALPROATE", "CBMZ"   .res Assessment: Plan:    Plan:  PDMP reviewed  Vraylar  1.5mg  daily for mood symptoms Trazadone 50mg  - 1 to 2 at hs Wellbutrin  XL 300mg  every morning - denies seizure history Wellbutrin  XL 150mg  every morning  RTC 4 weeks  30 minutes spent dedicated to the care of this patient on the date of this encounter to include pre-visit review of records, ordering of medication, post visit documentation, and face-to-face time with the patient discussing Bipolar disorder.  Discussed need for activitiy. Patient commits to going to silver sneakers 2 days a week to work on her mobility. Discussed continuing current medication regimen as mood has improved.  Patient advised to contact office with any questions, adverse effects, or acute worsening in signs and symptoms.  Discussed potential metabolic side effects associated with atypical antipsychotics, as well as potential risk for movement side effects. Advised pt to contact office if movement side effects occur.    Discussed potential benefits, risks, and side effects of Depakote  and need for period lab monitoring to assess for potential adverse effects to include obtaining CBC and LFTs.   There are no diagnoses linked to this encounter.   Please see After Visit Summary for patient specific instructions.  Future Appointments  Date Time Provider Department Center  02/03/2024  2:00 PM [facility]  [facility] None  03/01/2024  3:20 PM Munley, Margart Shears, MD [facility] None    No orders of the defined types were placed in this encounter.     -------------------------------

## 2024-01-12 ENCOUNTER — Other Ambulatory Visit: Payer: Self-pay

## 2024-01-12 MED ORDER — METOPROLOL SUCCINATE ER 25 MG PO TB24: ORAL_TABLET | ORAL | 1 refills | Status: DC

## 2024-01-12 NOTE — Telephone Encounter (Signed)
This is a [place] pt °

## 2024-01-29 ENCOUNTER — Other Ambulatory Visit: Payer: Self-pay | Admitting: Adult Health

## 2024-01-29 DIAGNOSIS — F319 Bipolar disorder, unspecified: Secondary | ICD-10-CM

## 2024-01-31 ENCOUNTER — Other Ambulatory Visit: Payer: Self-pay | Admitting: Adult Health

## 2024-01-31 DIAGNOSIS — F319 Bipolar disorder, unspecified: Secondary | ICD-10-CM

## 2024-02-03 ENCOUNTER — Telehealth: Payer: Self-pay | Admitting: Cardiology

## 2024-02-03 ENCOUNTER — Ambulatory Visit: Admitting: Adult Health

## 2024-02-03 ENCOUNTER — Ambulatory Visit: Attending: Cardiology

## 2024-02-03 ENCOUNTER — Other Ambulatory Visit: Payer: Self-pay

## 2024-02-03 DIAGNOSIS — Z7901 Long term (current) use of anticoagulants: Secondary | ICD-10-CM | POA: Insufficient documentation

## 2024-02-03 DIAGNOSIS — G459 Transient cerebral ischemic attack, unspecified: Secondary | ICD-10-CM | POA: Diagnosis present

## 2024-02-03 LAB — POCT INR: INR: 3 (ref 2.0–3.0)

## 2024-02-03 MED ORDER — TORSEMIDE 20 MG PO TABS: 20.0000 mg | ORAL_TABLET | Freq: Every day | ORAL | 3 refills | Status: AC

## 2024-02-03 NOTE — Telephone Encounter (Signed)
 Patient is requesting a refill on her Torsemide  20mg  and she would like to get it filled through [facility]. CB # [id number] or [phone] Please call patient once sent in.

## 2024-02-03 NOTE — Telephone Encounter (Signed)
 Called the patient and left a message explaining that her Torsemide  medication had been re-filled and if she had any questions to contact the office.

## 2024-02-03 NOTE — Patient Instructions (Signed)
 Description   Continue taking 1 tablet daily EXCEPT 2 tablets on Mondays, Wednesdays, Fridays.    Stay consistent with greens each week  Recheck INR in 6 weeks.  [facility] [phone]

## 2024-02-04 ENCOUNTER — Telehealth: Admitting: Adult Health

## 2024-02-04 ENCOUNTER — Encounter: Payer: Self-pay | Admitting: Adult Health

## 2024-02-04 DIAGNOSIS — Z79899 Other long term (current) drug therapy: Secondary | ICD-10-CM | POA: Diagnosis not present

## 2024-02-04 DIAGNOSIS — F319 Bipolar disorder, unspecified: Secondary | ICD-10-CM | POA: Diagnosis not present

## 2024-02-04 MED ORDER — BUPROPION HCL ER (XL) 150 MG PO TB24: 150.0000 mg | ORAL_TABLET | Freq: Every day | ORAL | 0 refills | Status: DC

## 2024-02-04 MED ORDER — TRAZODONE HCL 50 MG PO TABS: ORAL_TABLET | ORAL | 0 refills | Status: DC

## 2024-02-04 MED ORDER — CARIPRAZINE HCL 1.5 MG PO CAPS: 1.5000 mg | ORAL_CAPSULE | Freq: Every day | ORAL | 2 refills | Status: DC

## 2024-02-04 MED ORDER — BUPROPION HCL ER (XL) 300 MG PO TB24: 300.0000 mg | ORAL_TABLET | Freq: Every day | ORAL | 0 refills | Status: DC

## 2024-02-04 NOTE — Progress Notes (Signed)
 [name] [id number] 02-07-1956 68 y.o.  Virtual Visit via Video Note  I connected with pt @ on 02/04/24 at  3:00 PM EDT by a video enabled telemedicine application and verified that I am speaking with the correct person using two identifiers.   I discussed the limitations of evaluation and management by telemedicine and the availability of in person appointments. The patient expressed understanding and agreed to proceed.  I discussed the assessment and treatment plan with the patient. The patient was provided an opportunity to ask questions and all were answered. The patient agreed with the plan and demonstrated an understanding of the instructions.   The patient was advised to call back or seek an in-person evaluation if the symptoms worsen or if the condition fails to improve as anticipated.  I provided 30 minutes of non-face-to-face time during this encounter.  The patient was located at home.  The provider was located at [facility].   Reagan Camera, NP   Subjective:   Patient ID:  [name] is a 68 y.o. (DOB 28-May-1956) female.  Chief Complaint: No chief complaint on file.   HPI DAINELLE HUN presents for follow-up of Bipolar Disorder.  Reports a recent fall requiring a trip to the emergency room - injured arm.   Describes mood today as "ok". Pleasant. Denies tearfulness. Mood symptoms - reports feeling depressed some days. Denies irritability. Reports anxiety at times - "more sporadic". Reports varying interest and motivation - "doing very little". Denies panic attacks. Reports some worry and over thinking. Denies rumination. Denies mania. Reports mood as "stable". Stating "I feel about the same as I have been feeling". Reports taking medications as prescribed. Reports she is willing to consider other options. Energy levels about the same - "lower". Active, does not have a regular exercise routine with physical limitations. Has not been going to silver  sneakers. Enjoys usual interests and activities. Reports reading, doing quizzes, watching TV and talking to friends. Married. Lives with husband.  Has a son and a daughter. Spending time with family. Appetite adequate. Weight stable. Sleeps better some nights than others. Averages 5 hours  of broken sleep - sleeping in recliner. Reports some daytime napping. Focus and concentration better - doing puzzles, quizzes and math problems. Completing tasks. Managing minimal aspects of household. Retired. Denies SI or HI.  Denies AH or VH. Denies self harm. Denies substance use. Denies paranoia.  Previous medication trials: Pristiq, Wellbutrin , Cymbalta , other medications  Review of Systems:  Review of Systems  Musculoskeletal:  Negative for gait problem.  Neurological:  Negative for tremors.  Psychiatric/Behavioral:         Please refer to HPI    Medications: I have reviewed the patient's current medications.  Current Outpatient Medications  Medication Sig Dispense Refill   acetaminophen  (TYLENOL ) 325 MG tablet Take 2 tablets (650 mg total) by mouth every 6 (six) hours as needed for mild pain or headache. 30 tablet    buPROPion  (WELLBUTRIN  XL) 150 MG 24 hr tablet Take 1 tablet by mouth daily. 90 tablet 0   buPROPion  (WELLBUTRIN  XL) 300 MG 24 hr tablet Take 1 tablet by mouth daily. 90 tablet 0   camphor-menthol  (SARNA) lotion Apply 1 application topically as needed for itching. 222 mL 0   cariprazine  (VRAYLAR ) 1.5 MG capsule Take 1 capsule (1.5 mg total) by mouth daily. 30 capsule 2   docusate sodium  (COLACE) 100 MG capsule Take 200 mg by mouth daily.     Ferrous  Sulfate Dried (HIGH POTENCY IRON) 65 MG TABS Take 1 tablet by mouth daily.     folic acid  (FOLVITE ) 1 MG tablet Take 1 tablet (1 mg total) by mouth daily.     levonorgestrel (MIRENA) 20 MCG/DAY IUD 1 each by Intrauterine route once.     metoprolol  succinate (TOPROL -XL) 25 MG 24 hr tablet Take 2 tablets (50 mg total) by mouth every  morning AND 1 tablet (25 mg total) every evening. 1rst attempt, patient needs an appt for additional refills. 90 tablet 1   oxybutynin  (DITROPAN -XL) 5 MG 24 hr tablet Take 5 mg by mouth every evening.     potassium chloride  (KLOR-CON ) 10 MEQ tablet Take 10 mEq by mouth 3 (three) times daily.     pravastatin  (PRAVACHOL ) 40 MG tablet Take 1 tablet (40 mg total) by mouth daily at 6 PM. 30 tablet 0   senna-docusate (SENOKOT-S) 8.6-50 MG tablet Take 1 tablet by mouth 2 (two) times daily.     torsemide  (DEMADEX ) 20 MG tablet Take 1 tablet (20 mg total) by mouth daily. Please take torsemide  20 mg twice daily if weight greater than 210lbs. 90 tablet 3   traZODone  (DESYREL ) 50 MG tablet Take 1 to 2 tablets by mouth at bedtime for sleep. 60 tablet 0   vitamin B-12 1000 MCG tablet Take 1 tablet (1,000 mcg total) by mouth daily.     warfarin (COUMADIN ) 1 MG tablet TAKE 1 TO 2 TABLETS BY MOUTH ONCE DAILY AS DIRECTED BY  COUMADIN   CLINIC 45 tablet 0   warfarin (COUMADIN ) 1 MG tablet TAKE 1 TO 2 (ONE TO TWO) TABLETS BY MOUTH ONCE DAILY AS  DIRECTED  BY  COUMADIN   CLINIC 145 tablet 1   No current facility-administered medications for this visit.    Medication Side Effects: None  Allergies:  Allergies  Allergen Reactions   Vancomycin  Itching    Patient can receive vancomycin  with slower infusion and prn benadryl  for itching  Patient can receive vancomycin  with slower infusion and prn benadryl  for itching     Patient can receive vancomycin  with slower infusion and prn benadryl  for itching    Past Medical History:  Diagnosis Date   Anemia 06/03/2019   Aortic stenosis 03/23/2013   Overview:  02/18/13 TTE EF >55%. Critical AS with mean Ao valve gradient of 82 mm Hg. No AI. No MR, PR, mild TR. Estimated RVSP 30 mm Hg.   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 Postmenopausal bleeding since for the past 2 years.  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 Last biopsy was in May of 2019 which showed weakly proliferative endometrium possibly a polyp.     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    Family History  Problem Relation Age of Onset   Parkinson's disease Father    Heart attack Mother     Social History   Socioeconomic History   Marital status: Married    Spouse name: Not on file   Number of children: Not on file   Years of education: Not on file   Highest education level: Not on file  Occupational History   Not on file  Tobacco Use   Smoking status: Never   Smokeless tobacco: Never  Vaping Use   Vaping status: Never Used  Substance and Sexual Activity   Alcohol use: No   Drug use: No   Sexual activity: Not Currently  Other Topics Concern   Not on file  Social History Narrative   Not on file   Social Drivers of Health   Financial Resource Strain: Low Risk  (09/04/2022)   Received from [facility], [facility]    Overall Financial Resource Strain (CARDIA)    Difficulty of Paying Living Expenses: Not hard at all  Food Insecurity: Not on file  Transportation Needs: No Transportation Needs (09/06/2022)   Received from [facility], [facility]   PRAPARE - Transportation    Lack of Transportation (Medical): No    Lack of Transportation (Non-Medical): No  Physical Activity: Not on file  Stress: No Stress Concern Present (09/04/2022)   Received from [facility] - Occupational Stress Questionnaire    Feeling of Stress : Only a little  Social Connections: Unknown (09/03/2022)   Received from [facility], [facility]   Social Network    Social Network: Not on file  Intimate Partner Violence: Unknown (09/03/2022)   Received from [facility], [facility]   HITS    Physically Hurt: Not on file    Insult or Talk Down To: Not on file    Threaten Physical Harm: Not on file    Scream or Curse: Not on file    Past Medical History, Surgical history, Social history, and Family history were reviewed and updated as appropriate.   Please see review of systems for further details on the patient's review from today.   Objective:   Physical Exam:  There were no vitals taken for this visit.  Physical Exam Constitutional:      General: She is not in acute distress. Musculoskeletal:        General: No deformity.  Neurological:     Mental Status: She is alert and oriented to person, place, and time.     Coordination: Coordination normal.  Psychiatric:        Attention and Perception: Attention and perception normal. She does not perceive auditory or visual hallucinations.        Mood and Affect: Mood normal. Mood is not anxious or depressed. Affect is not labile, blunt, angry or inappropriate.        Speech: Speech normal.        Behavior: Behavior normal.        Thought Content: Thought content normal. Thought content is not paranoid  or delusional. Thought content does not include homicidal or suicidal ideation. Thought content does not include homicidal or suicidal plan.  Cognition and Memory: Cognition and memory normal.        Judgment: Judgment normal.     Comments: Insight intact     Lab Review:     Component Value Date/Time   NA 140 03/04/2023 1115   K 4.2 03/04/2023 1115   CL 100 03/04/2023 1115   CO2 25 03/04/2023 1115   GLUCOSE 103 (H) 03/04/2023 1115   GLUCOSE 88 03/31/2021 0042   BUN 12 03/04/2023 1115   CREATININE 1.05 (H) 03/04/2023 1115   CALCIUM 9.8 03/04/2023 1115   PROT 6.8 03/04/2023 1115   ALBUMIN 4.2 03/04/2023 1115   AST 14 03/04/2023 1115   ALT 9 03/04/2023 1115   ALKPHOS 54 03/04/2023 1115   BILITOT 0.4 03/04/2023 1115   GFRNONAA >60 03/31/2021 0042   GFRAA 74 06/06/2020 0903       Component Value Date/Time   WBC 9.8 03/04/2023 1115   WBC 13.4 (H) 04/02/2021 0008   RBC 4.77 03/04/2023 1115   RBC 5.45 (A) 08/07/2021 0000   HGB 14.4 03/04/2023 1115   HCT 45.2 03/04/2023 1115   PLT 181 03/04/2023 1115   MCV 95 03/04/2023 1115   MCV 88 08/07/2021 0000   MCH 30.2 03/04/2023 1115   MCH 27.9 04/02/2021 0008   MCHC 31.9 03/04/2023 1115   MCHC 30.4 04/02/2021 0008   RDW 14.0 03/04/2023 1115   LYMPHSABS 2.0 03/31/2021 0042   MONOABS 0.9 03/31/2021 0042   EOSABS 0.8 (H) 03/31/2021 0042   BASOSABS 0.1 03/31/2021 0042    No results found for: "POCLITH", "LITHIUM"   No results found for: "PHENYTOIN", "PHENOBARB", "VALPROATE", "CBMZ"   .res Assessment: Plan:    Plan:  PDMP reviewed  Continue: Vraylar  1.5mg  daily for mood symptoms Trazadone 50mg  - 1 to 2 at hs Wellbutrin  XL 300mg  every morning - denies seizure history Wellbutrin  XL 150mg  every morning  Husband notes patient has been through several cycles over the years and feels like it has been about 2 years each time before she starts feeling better. He does note that she seems to be making more progress than she  has with previous episodes. Will continue to encourage her to get up and be more active.   RTC 4 weeks  30 minutes spent dedicated to the care of this patient on the date of this encounter to include pre-visit review of records, ordering of medication, post visit documentation, and face-to-face time with the patient discussing Bipolar disorder. Discussed need for activitiy. Patient commits to going to silver sneakers 2 days a week to work on her mobility. Discussed continuing current medication regimen as mood has improved.  Patient advised to contact office with any questions, adverse effects, or acute worsening in signs and symptoms.  Discussed potential metabolic side effects associated with atypical antipsychotics, as well as potential risk for movement side effects. Advised pt to contact office if movement side effects occur.    Discussed potential benefits, risks, and side effects of Depakote  and need for period lab monitoring to assess for potential adverse effects to include obtaining CBC and LFTs.   There are no diagnoses linked to this encounter.   Please see After Visit Summary for patient specific instructions.  Future Appointments  Date Time Provider Department Center  02/04/2024  3:00 PM Doyl Bitting, Ursula Gardner, NP [facility] None  03/01/2024  3:20 PM Hassan Links, MD [facility] None  03/16/2024  2:00 PM [facility]  [facility] None    No orders of the defined types were  placed in this encounter.     -------------------------------

## 2024-02-05 ENCOUNTER — Encounter: Payer: Self-pay | Admitting: Cardiology

## 2024-03-01 ENCOUNTER — Ambulatory Visit: Admitting: Cardiology

## 2024-03-03 ENCOUNTER — Encounter: Payer: Self-pay | Admitting: Adult Health

## 2024-03-03 ENCOUNTER — Telehealth: Admitting: Adult Health

## 2024-03-03 DIAGNOSIS — F319 Bipolar disorder, unspecified: Secondary | ICD-10-CM | POA: Diagnosis not present

## 2024-03-03 MED ORDER — LAMOTRIGINE 25 MG PO TABS: ORAL_TABLET | ORAL | 0 refills | Status: DC

## 2024-03-03 NOTE — Progress Notes (Signed)
 RICKETTA COLANTONIO [id number] [birth year] 68 y.o.  Virtual Visit via Video Note  I connected with pt @ on 03/03/24 at  3:00 PM EDT by a video enabled telemedicine application and verified that I am speaking with the correct person using two identifiers.   I discussed the limitations of evaluation and management by telemedicine and the availability of in person appointments. The patient expressed understanding and agreed to proceed.  I discussed the assessment and treatment plan with the patient. The patient was provided an opportunity to ask questions and all were answered. The patient agreed with the plan and demonstrated an understanding of the instructions.   The patient was advised to call back or seek an in-person evaluation if the symptoms worsen or if the condition fails to improve as anticipated.  I provided 25 minutes of non-face-to-face time during this encounter.  The patient was located at home.  The provider was located at [facility].   Angeline LOISE Sayers, NP   Subjective:   Patient ID:  AZRIELLE SPRINGSTEEN is a 68 y.o. (DOB 1956/07/10) female.  Chief Complaint: No chief complaint on file.   HPI GRAY MAUGERI presents for follow-up of Bipolar Disorder.  Describes mood today as ok. Pleasant. Denies tearfulness. Mood symptoms - reports feeling depressed at times - not every day. Reports feeling depressed 5 out 7 days. Reports her depression on the 5 days is about medium. Reports anxiety at times - not every day. Denies irritability. Reports varying interest and motivation - mostly sitting in her chair throughout the day. Reports watching TV and reading. Reports struggling to complete ADL's. Reports getting her hair done monthly.  Denies panic attacks. Reports some worry at times - driving again. Denies over thinking and rumination. Denies mania. Reports mood as stable. Stating I feel like I'm about the same. Reports taking medications as prescribed.  Energy levels  are lower- lower. Active, does not have a regular exercise routine with physical limitations. Has been to silver sneakers a few times. Enjoys usual interests and activities. Reports reading, doing quizzes, watching TV and talking to friends. Married. Lives with husband.  Has a son and a daughter. Spending time with family. Appetite adequate. Weight fluctuates. Sleeps better some nights than others. Averages 5 hours of broken sleep - sleeping in recliner. Reports some daytime napping. Focus and concentration better - doing puzzles, quizzes and math problems. Completing tasks. Managing minimal aspects of household. Retired. Denies SI or HI.  Denies AH or VH. Denies self harm. Denies substance use. Denies paranoia.  Previous medication trials: Pristiq, Wellbutrin , Cymbalta , other medications  Review of Systems:  Review of Systems  Musculoskeletal:  Negative for gait problem.  Neurological:  Negative for tremors.  Psychiatric/Behavioral:         Please refer to HPI    Medications: I have reviewed the patient's current medications.  Current Outpatient Medications  Medication Sig Dispense Refill   acetaminophen  (TYLENOL ) 325 MG tablet Take 2 tablets (650 mg total) by mouth every 6 (six) hours as needed for mild pain or headache. 30 tablet    buPROPion  (WELLBUTRIN  XL) 150 MG 24 hr tablet Take 1 tablet (150 mg total) by mouth daily. 90 tablet 0   buPROPion  (WELLBUTRIN  XL) 300 MG 24 hr tablet Take 1 tablet (300 mg total) by mouth daily. 90 tablet 0   camphor-menthol  (SARNA) lotion Apply 1 application topically as needed for itching. 222 mL 0   cariprazine  (VRAYLAR ) 1.5 MG capsule Take 1 capsule (1.5  mg total) by mouth daily. 30 capsule 2   docusate sodium  (COLACE) 100 MG capsule Take 200 mg by mouth daily.     Ferrous Sulfate  Dried (HIGH POTENCY IRON) 65 MG TABS Take 1 tablet by mouth daily.     folic acid  (FOLVITE ) 1 MG tablet Take 1 tablet (1 mg total) by mouth daily.     levonorgestrel  (MIRENA) 20 MCG/DAY IUD 1 each by Intrauterine route once.     metoprolol  succinate (TOPROL -XL) 25 MG 24 hr tablet Take 2 tablets (50 mg total) by mouth every morning AND 1 tablet (25 mg total) every evening. 1rst attempt, patient needs an appt for additional refills. 90 tablet 1   oxybutynin  (DITROPAN -XL) 5 MG 24 hr tablet Take 5 mg by mouth every evening.     potassium chloride  (KLOR-CON ) 10 MEQ tablet Take 10 mEq by mouth 3 (three) times daily.     pravastatin  (PRAVACHOL ) 40 MG tablet Take 1 tablet (40 mg total) by mouth daily at 6 PM. 30 tablet 0   senna-docusate (SENOKOT-S) 8.6-50 MG tablet Take 1 tablet by mouth 2 (two) times daily.     torsemide  (DEMADEX ) 20 MG tablet Take 1 tablet (20 mg total) by mouth daily. Please take torsemide  20 mg twice daily if weight greater than 210lbs. 90 tablet 3   traZODone  (DESYREL ) 50 MG tablet Take 1 to 2 tablets by mouth at bedtime for sleep. 180 tablet 0   vitamin B-12 1000 MCG tablet Take 1 tablet (1,000 mcg total) by mouth daily.     warfarin (COUMADIN ) 1 MG tablet TAKE 1 TO 2 TABLETS BY MOUTH ONCE DAILY AS DIRECTED BY  COUMADIN   CLINIC 45 tablet 0   warfarin (COUMADIN ) 1 MG tablet TAKE 1 TO 2 (ONE TO TWO) TABLETS BY MOUTH ONCE DAILY AS  DIRECTED  BY  COUMADIN   CLINIC 145 tablet 1   No current facility-administered medications for this visit.    Medication Side Effects: None  Allergies:  Allergies  Allergen Reactions   Vancomycin  Itching    Patient can receive vancomycin  with slower infusion and prn benadryl  for itching  Patient can receive vancomycin  with slower infusion and prn benadryl  for itching     Patient can receive vancomycin  with slower infusion and prn benadryl  for itching    Past Medical History:  Diagnosis Date   Anemia 06/03/2019   Aortic stenosis 03/23/2013   Overview:  02/18/13 TTE EF >55%. Critical AS with mean Ao valve gradient of 82 mm Hg. No AI. No MR, PR, mild TR. Estimated RVSP 30 mm Hg.   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 Postmenopausal bleeding  since for the past 2 years.  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 Last biopsy was in May of 2019 which showed weakly proliferative endometrium possibly a polyp.     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    Family History  Problem Relation Age of Onset   Parkinson's disease Father    Heart attack Mother     Social History   Socioeconomic History   Marital status: Married    Spouse name: Not on file   Number of children: Not on file   Years of education: Not on file   Highest education level: Not on file  Occupational History   Not on file  Tobacco Use   Smoking status: Never   Smokeless tobacco: Never  Vaping Use   Vaping status: Never Used  Substance and Sexual Activity   Alcohol use: No   Drug use: No   Sexual activity: Not Currently  Other Topics Concern   Not on file  Social  History Narrative   Not on file   Social Drivers of Health   Financial Resource Strain: Low Risk  (09/04/2022)   Received from [facility]   Overall Financial Resource Strain (CARDIA)    Difficulty of Paying Living Expenses: Not hard at all  Food Insecurity: Not on file  Transportation Needs: No Transportation Needs (09/06/2022)   [facility] from [facility] - Transportation    Lack of Transportation (Medical): No    Lack of Transportation (Non-Medical): No  Physical Activity: Not on file  Stress: No Stress Concern Present (09/04/2022)   Received from [facility] - Occupational Stress Questionnaire    Feeling of Stress : Only a little  Social Connections: Unknown (09/03/2022)   Received from [facility]   Social Network    Social Network: Not on file  Intimate Partner Violence: Unknown (09/03/2022)   Received from [facility]   HITS    Physically Hurt: Not on file    Insult or Talk Down To: Not on file    Threaten Physical Harm: Not on file    Scream or Curse: Not on file    Past Medical History, Surgical history, Social history, and Family history were reviewed and updated as appropriate.   Please see review of systems for further details on the patient's review from today.   Objective:   Physical Exam:  There were no vitals taken for this visit.  Physical Exam Constitutional:      General: She is not in acute distress.  Musculoskeletal:        General: No deformity.   Neurological:     Mental Status: She is alert and oriented to person, place, and time.     Coordination: Coordination normal.   Psychiatric:        Attention and Perception: Attention and perception normal. She does not perceive auditory or visual hallucinations.        Mood and Affect: Mood normal. Mood is not anxious or depressed. Affect is not labile, blunt, angry or inappropriate.        Speech: Speech normal.        Behavior:  Behavior normal.        Thought Content: Thought content normal. Thought content is not paranoid or delusional.  Thought content does not include homicidal or suicidal ideation. Thought content does not include homicidal or suicidal plan.        Cognition and Memory: Cognition and memory normal.        Judgment: Judgment normal.     Comments: Insight intact     Lab Review:     Component Value Date/Time   NA 140 03/04/2023 1115   K 4.2 03/04/2023 1115   CL 100 03/04/2023 1115   CO2 25 03/04/2023 1115   GLUCOSE 103 (H) 03/04/2023 1115   GLUCOSE 88 03/31/2021 0042   BUN 12 03/04/2023 1115   CREATININE 1.05 (H) 03/04/2023 1115   CALCIUM 9.8 03/04/2023 1115   PROT 6.8 03/04/2023 1115   ALBUMIN 4.2 03/04/2023 1115   AST 14 03/04/2023 1115   ALT 9 03/04/2023 1115   ALKPHOS 54 03/04/2023 1115   BILITOT 0.4 03/04/2023 1115   GFRNONAA >60 03/31/2021 0042   GFRAA 74 06/06/2020 0903       Component Value Date/Time   WBC 9.8 03/04/2023 1115   WBC 13.4 (H) 04/02/2021 0008   RBC 4.77 03/04/2023 1115   RBC 5.45 (A) 08/07/2021 0000   HGB 14.4 03/04/2023 1115   HCT 45.2 03/04/2023 1115   PLT 181 03/04/2023 1115   MCV 95 03/04/2023 1115   MCV 88 08/07/2021 0000   MCH 30.2 03/04/2023 1115   MCH 27.9 04/02/2021 0008   MCHC 31.9 03/04/2023 1115   MCHC 30.4 04/02/2021 0008   RDW 14.0 03/04/2023 1115   LYMPHSABS 2.0 03/31/2021 0042   MONOABS 0.9 03/31/2021 0042   EOSABS 0.8 (H) 03/31/2021 0042   BASOSABS 0.1 03/31/2021 0042    No results found for: POCLITH, LITHIUM   No results found for: PHENYTOIN, PHENOBARB, VALPROATE, CBMZ   .res Assessment: Plan:   Plan:  PDMP reviewed  Continue: Vraylar  1.5mg  daily for mood symptoms Trazadone 50mg  - 1 to 2 at hs Wellbutrin  XL 300mg  every morning - denies seizure history Wellbutrin  XL 150mg  every morning  30 minutes spent dedicated to the care of this patient on the date of this encounter to include pre-visit review of  records, ordering of medication, post visit documentation, and face-to-face time with the patient discussing Bipolar disorder. Will add Lamictal 25mg  at hs x 14 days, then increase to 50mg  daily - has not tried previously.  Counseled patient regarding potential benefits, risks, and side effects of Lamictal to include potential risk of Stevens-Johnson syndrome. Advised patient to stop taking Lamictal and contact office immediately if rash develops and to seek urgent medical attention if rash is severe and/or spreading quickly. Will start Lamictal 25 mg daily for 2 weeks, then increase to 50 mg daily for 2 weeks for mood symptoms.    Husband notes patient has been through several cycles over the years and feels like it has been about 2 years each time before she starts feeling better. He does note that she seems to be making more progress than she has with previous episodes. Will continue to encourage her to get up and be more active.   RTC 4 weeks  Patient advised to contact office with any questions, adverse effects, or acute worsening in signs and symptoms.  Discussed potential metabolic side effects associated with atypical antipsychotics, as well as potential risk for movement side effects. Advised pt to contact office if movement side effects occur.    Discussed potential benefits, risks, and side effects of Depakote  and need for period lab monitoring to  assess for potential adverse effects to include obtaining CBC and LFTs.   There are no diagnoses linked to this encounter.   Please see After Visit Summary for patient specific instructions.  Future Appointments  Date Time Provider Department Center  03/03/2024  3:00 PM Johngabriel Verde, Angeline Mattocks, NP [facility] None  03/10/2024  1:30 PM Carlin Delon BROCKS, NP [facility] None  03/16/2024  2:00 PM [facility]  [facility] None    No orders of the defined types were placed in this encounter.     -------------------------------

## 2024-03-08 NOTE — Progress Notes (Signed)
 Cardiology Office Note   Date:  03/10/2024  ID:  [name], DOB 1956-01-17, MRN [id number] PCP: Martha Dene BROCKS, DO   HeartCare Providers Cardiologist:  Martha Leiter, MD     History of Present Illness [name] is a 68 y.o. 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  05/20/2019 monitor average HR 99 bpm, no atrial fibrillation or flutter 06/07/2019 TEE EF 65-70%, mild to moderate mitral annular calcification with mild MR 03/30/2013 SAVR Dr [name] at [facility]  In 2014 she developed severe symptomatic aortic stenosis with heart failure and underwent SAVR at [facility].  She developed thrombosis of her bioprosthetic valve necessitating long-term anticoagulation with warfarin.  Most recently evaluated by Dr. Leiter on 03/04/2023, she was doing well, she was losing weight intentionally, bothered by fatigue but no other symptoms, tolerating her anticoagulant, no changes made to her medications or plan of care she was advised to follow-up in a year.   She presents today for follow-up of her aortic stenosis.  She does not offer any formal complaints from a cardiac perspective.  She is relatively sedentary, does not participate any formal exercise.  She denies chest pain, palpitations, dyspnea, pnd, orthopnea, n, v, dizziness, syncope, edema, weight gain, or early satiety.   ROS: Review of Systems  Cardiovascular:  Positive for leg swelling.  Endo/Heme/Allergies:  Bruises/bleeds easily.  All other systems reviewed and are negative.    Studies Reviewed EKG Interpretation Date/Time:  Wednesday March 10 2024 13:55:17 EDT Ventricular Rate:  79 PR Interval:  224 QRS Duration:  98 QT Interval:  374 QTC  Calculation: 428 R Axis:   4  Text Interpretation: Sinus rhythm with 1st degree A-V block When compared with ECG of 22-Mar-2021 18:18, PREVIOUS ECG IS PRESENT Confirmed by [name] [id number] on 03/10/2024 1:57:01 PM    Cardiac Studies & Procedures   ______________________________________________________________________________________________ CARDIAC CATHETERIZATION  CARDIAC CATHETERIZATION 04/01/2019  Conclusion Images from the original result were not included.   Hemodynamic findings consistent with aortic valve stenosis.  [name] is a 68 y.o. female   [id number] LOCATION:  FACILITY: [facility] PHYSICIAN: [name], M.D. Aug 15, 1956   DATE OF PROCEDURE:  04/01/2019  DATE OF DISCHARGE:     CARDIAC CATHETERIZATION    History obtained from chart review.68 y.o. female with hx of AS (s/p AVR), HTN, morbid obesity, OSA, lymphedema and LE ulcers who presents for chest pain/chest pressure.  Her troponins were low and flat.  LV function was normal by 2D echo.  She presents now for right and left heart cath to define her anatomy and physiology.  Impression [name] has normal coronary arteries and mildly elevated filling pressures.  I do not think her chest pain is ischemically mediated.  Her enzymes are low and flat.  It could be related to demand ischemia.  Continue medical therapy will be recommended.  The sheaths were removed and a TR band was placed on the right wrist to achieve patent hemostasis.  The patient left lab in stable condition.  The findings were reviewed with Dr. Vina [name].  [name]. MD, [facility] 04/01/2019 5:46 PM  Findings Coronary Findings Diagnostic  Dominance: Right  No diagnostic findings have been documented. Intervention  No interventions have been documented.     ECHOCARDIOGRAM  ECHOCARDIOGRAM  COMPLETE 06/04/2019  Narrative ECHOCARDIOGRAM REPORT    Patient Name:   [name] Date of Exam: 06/04/2019 Medical Rec #:   [id number]      Height:       62.0 in Accession #:    [id number]     Weight:       281.3 lb Date of Birth:  09-Oct-1955       BSA:          2.21 m Patient Age:    63 years       BP:           121/57 mmHg Patient Gender: F              HR:           86 bpm. Exam Location:  Inpatient  Procedure: 2D Echo  Indications:    TIA 435.9  History:        Patient has prior history of Echocardiogram examinations, most recent 03/31/2019. CHF; CAD Risk Factors:Sleep Apnea and Hypertension. Aortic Valve: A  Sonographer:    [name] Referring Phys: [id number] [name]  IMPRESSIONS   1. Left ventricular ejection fraction, by visual estimation, is >75%. The left ventricle has hyperdynamic function. Normal left ventricular size. There is mildly increased left ventricular hypertrophy. 2. Left ventricular diastolic Doppler parameters are consistent with pseudonormalization pattern of LV diastolic filling. 3. Global right ventricle has normal systolic function.The right ventricular size is normal. No increase in right ventricular wall thickness. 4. Left atrial size was mildly dilated. 5. Mild to moderate mitral annular calcification. 6. The tricuspid valve is grossly normal. Tricuspid valve regurgitation moderate-severe. 7. The aortic valve was not well visualized 8. AV is thickened, calcified with restricted motion. Peak and mean gradients through the valve are 43 and 25 mm Hg respectively consistent with mild to moderate AS. Since echo in July 2020, NSC in mean gradient.  FINDINGS Left Ventricle: Left ventricular ejection fraction, by visual estimation, is >75%. The left ventricle has hyperdynamic function. There is mildly increased left ventricular hypertrophy. Normal left ventricular size. Spectral Doppler shows Left ventricular diastolic Doppler parameters are consistent with pseudonormalization pattern of LV diastolic filling.  Right Ventricle: The right ventricular size is normal. No  increase in right ventricular wall thickness. Global RV systolic function is has normal systolic function. The tricuspid regurgitant velocity is 3.12 m/s, and with an assumed right atrial pressure of 3 mmHg, the estimated right ventricular systolic pressure is moderately elevated at 41.9 mmHg.  Left Atrium: Left atrial size was mildly dilated.  Right At[facility]: Right atrial size was normal in size  Pericardium: There is no evidence of pericardial effusion.  Mitral Valve: The mitral valve is abnormal. There is mild thickening of the mitral valve leaflet(s). Mild to moderate mitral annular calcification. No evidence of mitral valve regurgitation.  Tricuspid Valve: The tricuspid valve is grossly normal. Tricuspid valve regurgitation moderate-severe by color flow Doppler.  Aortic Valve: The aortic valve was not well visualized. Aortic valve regurgitation was not visualized by color flow Doppler. Aortic valve mean gradient measures 25.5 mmHg. Aortic valve peak gradient measures 43.4 mmHg. Aortic valve area, by VTI measures 1.25 cm. AV is thickened, calcified with restricted motion. Peak and mean gradients through the valve are 43 and 25 mm Hg respectively consistent with mild to moderate AS. Since echo in July 2020, NSC in mean gradient.  Pulmonic Valve: The pulmonic valve was not well visualized. Pulmonic valve regurgitation is not visualized  by color flow Doppler.  Aorta: The aortic root is normal in size and structure.  IAS/Shunts: The interatrial septum was not assessed.    LEFT VENTRICLE PLAX 2D LVIDd:         4.50 cm  Diastology LVIDs:         2.40 cm  LV e' lateral:   9.68 cm/s LV PW:         1.20 cm  LV E/e' lateral: 14.5 LV IVS:        1.10 cm  LV e' medial:    8.05 cm/s LVOT diam:     1.70 cm  LV E/e' medial:  17.4 LV SV:         72 ml LV SV Index:   29.55 LVOT Area:     2.27 cm   RIGHT VENTRICLE RV S prime:     17.70 cm/s TAPSE (M-mode): 2.1 cm  LEFT ATRIUM              Index       RIGHT ATRIUM           Index LA diam:        4.30 cm 1.95 cm/m  RA Area:     15.30 cm LA Vol (A2C):   70.1 ml 31.72 ml/m RA Volume:   32.10 ml  14.53 ml/m LA Vol (A4C):   57.4 ml 25.97 ml/m LA Biplane Vol: 64.9 ml 29.37 ml/m AORTIC VALVE AV Area (Vmax):    1.20 cm AV Area (Vmean):   1.19 cm AV Area (VTI):     1.25 cm AV Vmax:           329.50 cm/s AV Vmean:          238.000 cm/s AV VTI:            0.645 m AV Peak Grad:      43.4 mmHg AV Mean Grad:      25.5 mmHg LVOT Vmax:         173.50 cm/s LVOT Vmean:        124.500 cm/s LVOT VTI:          0.355 m LVOT/AV VTI ratio: 0.55  AORTA Ao Root diam: 3.50 cm Ao Asc diam:  3.10 cm  MITRAL VALVE                         TRICUSPID VALVE MV Area (PHT): 3.60 cm              TR Peak grad:   38.9 mmHg MV PHT:        61.19 msec            TR Vmax:        312.00 cm/s MV Decel Time: 211 msec MV E velocity: 140.00 cm/s 103 cm/s  SHUNTS MV A velocity: 88.10 cm/s  70.3 cm/s Systemic VTI:  0.35 m MV E/A ratio:  1.59        1.5       Systemic Diam: 1.70 cm   Martha Gull MD Electronically signed by Martha Gull MD Signature Date/Time: 06/04/2019/3:31:10 PM    Final   TEE  ECHO TEE 06/07/2019  Narrative TRANSESOPHOGEAL ECHO REPORT    Patient Name:   [name] Date of Exam: 06/07/2019 Medical Rec #:  [id number]      Height:       62.0 in Accession #:    [id number]  Weight:       278.0 lb Date of Birth:  03/24/1956       BSA:          2.20 m Patient Age:    63 years       BP:           114/50 mmHg Patient Gender: F              HR:           92 bpm. Exam Location:  Inpatient   Procedure: Transesophageal Echo, Saline Contrast Bubble Study, Cardiac Doppler and Color Doppler  Indications:     TIA  History:         Patient has prior history of Echocardiogram examinations, most recent 06/04/2019. CHF, CAD; Aortic stenosis Risk Factors:Hypertension, Sleep Apnea and Obesity. Aortic Valve: A 23mm Hancock  bioprosthetic H/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.  Sonographer:     Mitzie Fox Referring Phys:  [phone] MIHAI CROITORU Diagnosing Phys: Shelda Bruckner MD    PROCEDURE: After discussion of the risks and benefits of a TEE, an informed consent was obtained from the patient. Patients was monitored while under deep sedation. The transesophogeal probe was passed through the esophogus of the patient. Imaged were obtained with the patient in a left lateral decubitus position. Image quality was excellent. The patient's vital signs; including heart rate, blood pressure, and oxygen saturation; remained stable throughout the procedure. The patient developed no complications during the procedure.  IMPRESSIONS   1. Normal appearance of bioprosthetic aortic valve. Gradients consistent with prosthetic valve size. On this study, accelerated flow seen again across LVOT. As she received MAC with propofol  (vs. Conscious sedation on last TEE), overall LVOT gradient appeared lower than prior study. No evidence of thrombus. She did have a small PFO again visualized, and her bubble study was rapidly positive with inspiration. She snored loudly throughout procedure and needed jaw thrust/blowby O2 in addition to nasal cannula to maintain saturations. Right sided pressures also moderately elevated. 2. Left ventricular ejection fraction, by visual estimation, is 65 to 70%. The left ventricle has hyperdynamic function. There is severely increased left ventricular hypertrophy. 3. Left ventricular diastolic function could not be evaluated pattern of LV diastolic filling. 4. Global right ventricle has normal systolic function.The right ventricular size is normal. No increase in right ventricular wall thickness. 5. Left atrial size was not assessed. 6. Right atrial size was not assessed. 7. Mild to moderate mitral annular calcification. 8. The mitral valve is normal in structure. Mild mitral valve  regurgitation. 9. The tricuspid valve is normal in structure. Tricuspid valve regurgitation moderate-severe. 10. Aortic valve mean gradient measures 18.0 mmHg. 11. Aortic valve regurgitation was not visualized by color flow Doppler. Mild aortic valve stenosis. 12. The pulmonic valve was grossly normal. Pulmonic valve regurgitation is trivial by color flow Doppler. 13. Mild plaque invoving the descending aorta. 14. Moderately elevated pulmonary artery systolic pressure. 15. Small patent foramen ovale with predominantly left to right shunting across the atrial septum. 16. Evidence of atrial level shunting detected by color flow Doppler.  FINDINGS Left Ventricle: Left ventricular ejection fraction, by visual estimation, is 65 to 70%. The left ventricle has hyperdynamic function. There is severely increased left ventricular hypertrophy. Septal left ventricular hypertrophy. Spectral Doppler shows Left ventricular diastolic function could not be evaluated pattern of LV diastolic filling.  Right Ventricle: The right ventricular size is normal. No increase in right ventricular wall thickness. Global RV systolic function is  has normal systolic function. The tricuspid regurgitant velocity is 2.95 m/s, and with an assumed right atrial pressure of 8 mmHg, the estimated right ventricular systolic pressure is moderately elevated at 42.8 mmHg.  Left Atrium: Left atrial size was not assessed.  Right Atrium: Right atrial size was not assessed  Pericardium: There is no evidence of pericardial effusion.  Mitral Valve: The mitral valve is normal in structure. Mild to moderate mitral annular calcification. Mild mitral valve regurgitation.  Tricuspid Valve: The tricuspid valve is normal in structure. Tricuspid valve regurgitation moderate-severe by color flow Doppler.  Aortic Valve: The aortic valve has been repaired/replaced. Aortic valve regurgitation was not visualized by color flow Doppler. Mild aortic  stenosis is present. Aortic valve mean gradient measures 18.0 mmHg. Aortic valve peak gradient measures 32.9 mmHg. 23mm Hancock bioprosthetic valve is present in the aortic position. Echo findings shows no evidence of rocking, vegetation, thrombus/mass, dehiscence, stenosis, regurgitation, perivalvular leak or abscess of the aortic prosthesis.  Pulmonic Valve: The pulmonic valve was grossly normal. Pulmonic valve regurgitation is trivial by color flow Doppler.  Aorta: The aortic root and ascending aorta are structurally normal, with no evidence of dilitation. There is mild plaque involving the descending aorta.  Shunts: Agitated saline contrast was given intravenously to evaluate for intracardiac shunting. Saline contrast bubble study was positive, with evidence of interatrial shunting. A small patent foramen ovale is detected with predominantly left to right shunting across the atrial septum. Evidence of atrial level shunting detected by color flow Doppler.   AORTIC VALVE                    Normals AV Vmax:           287.00 cm/s AV Vmean:          193.000 cm/s 77 cm/s AV VTI:            0.491 m      3.15 cm2 AV Peak Grad:      32.9 mmHg AV Mean Grad:      18.0 mmHg    3 mmHg LVOT Vmax:         158.00 cm/s LVOT Vmean:        104.000 cm/s 75 cm/s LVOT VTI:          0.272 m      25.3 cm LVOT/AV VTI ratio: 0.55         1  TRICUSPID VALVE             Normals TR Peak grad:   34.8 mmHg TR Vmax:        295.00 cm/s 288 cm/s  SHUNTS Systemic VTI: 0.27 m   Shelda Bruckner MD Electronically signed by Shelda Bruckner MD Signature Date/Time: 06/07/2019/4:32:52 PM    Final  MONITORS  CARDIAC EVENT MONITOR 08/19/2019  Narrative Patient utilized event monitor for 30 days beginning 06/29/2019 completing 07/28/2019.  The rhythm throughout was sinus the average rate was 99 and there were no episodes of bradycardia or pauses of 3 seconds or greater.  There was 1 stable event  sinus tachycardia.  There were no symptomatic events.  There were no episodes of atrial fibrillation or flutter or SVT.  No ventricular ectopy was noted.   Conclusion normal 30-day event monitor.   CT SCANS  CT CORONARY MORPH W/CTA COR W/SCORE 05/20/2019  Addendum 05/22/2019 10:24 PM ADDENDUM REPORT: 05/22/2019 22:21  CLINICAL DATA:  68 year old female with h/o AVR with a 23 mm Hancock valve  on 03/30/13, now with increased gradients. Coronary angiography in July 2020 showed no significant CAD and 43 mm peak to peak gradient across the valve and echocardiogram suggestive of AVR dysfunction.  EXAM: Cardiac TAVR CT  TECHNIQUE: The patient was scanned on a [employer]. A 120 kV retrospective scan was triggered in the descending thoracic aorta at 111 HU's. Gantry rotation speed was 250 msecs and collimation was .6 mm. No beta blockade or nitro were given. The 3D data set was reconstructed in 5% intervals of the R-R cycle. Systolic and diastolic phases were analyzed on a dedicated work station using MPR, MIP and VRT modes. The patient received 80 cc of contrast.  FINDINGS: Aortic Valve: 23 mm Hancock valve is seen in the aortic position. Annular ring measures 23 x 23 mm. There is no abnormal valve motion. No dehiscence/perivalvular leak. Leaflet visualization is significantly limited secondary to patient's size (BMI 52). There are two hypo-attenuated areas under the leaflets that possibly represent leaflets thrombosis.  Aorta: There is ascending aortic aneurysm with maximum diameter 42 mm. Normal size of the aortic arch and descending aorta. Minimal atherosclerotic plaque, no dissection.  Sinotubular Junction: 33 x 33 mm  Ascending Thoracic Aorta: 42 x 41 mm  Aortic Arch: 25 x 23 mm  Descending Thoracic Aorta: 23 x 23 mm  No thrombus in the left atrial appendage.  Normal size of the pulmonary artery.  IMPRESSION: 1. 23 mm Hancock valve is seen in the  aortic position with normal size of the annular ring (23 mm). There is no abnormal valve motion, no dehiscence/perivalvular leak. Unfortunately, secondary to patient's size (BMI 52) and beam harding artifact leaflet visualization is very limited and leaflet opening is not visualized. There are two hypo-attenuated areas under the leaflets that possibly represent leaflets thrombosis.  In the presence of symptoms consider TEE for further evaluation.  2. Ascending aortic aneurysm with maximum diameter 42 mm. Annual chest CTA or MRA is recommended.   Electronically Signed By: Leim Moose On: 05/22/2019 22:21  Narrative EXAM: OVER-READ INTERPRETATION  CT CHEST  The following report is an over-read performed by radiologist Dr. Toribio Aye of [facility] on 05/20/2019. This over-read does not include interpretation of cardiac or coronary anatomy or pathology. The coronary calcium score/coronary CTA interpretation by the cardiologist is attached.  COMPARISON:  Chest CTA 03/30/2019.  FINDINGS: Extracardiac findings will be described separately under dictation for contemporaneously obtained CTA chest, abdomen and pelvis.  IMPRESSION: Please see separate dictation for contemporaneously obtained CTA chest, abdomen and pelvis dated 05/20/2019 for full description of relevant extracardiac findings.  Electronically Signed: By: Toribio Aye M.D. On: 05/20/2019 14:28     ______________________________________________________________________________________________      Risk Assessment/Calculations           Physical Exam VS:  BP 102/76   Pulse 76   Ht 5' 1 (1.549 m)   Wt 212 lb 9.6 oz (96.4 kg)   SpO2 94%   BMI 40.17 kg/m        Wt Readings from Last 3 Encounters:  03/10/24 212 lb 9.6 oz (96.4 kg)  03/04/23 212 lb 6.4 oz (96.3 kg)  05/22/22 243 lb 3.2 oz (110.3 kg)    GEN: Well nourished, well developed in no acute distress NECK: No JVD; No  carotid bruits CARDIAC: RRR, no murmurs, rubs, gallops RESPIRATORY:  Clear to auscultation without rales, wheezing or rhonchi  ABDOMEN: Soft, non-tender, non-distended EXTREMITIES: Legs are edematous with trophic skin changes and  weeping in some areas; No deformity   ASSESSMENT AND PLAN Aortic stenosis due to bicuspid aortic valve s/p SAVR-most recent echo was in 2020, will repeat echocardiograms for for surveillance of her AVR.  Will arrange for SBE.  CAD - Stable with no anginal symptoms. No indication for ischemic evaluation.  Currently on warfarin therefore she is not on aspirin , continue nitroglycerin , continue metoprolol  50 mg in the morning and 25 mg in the evening.   HFpEF - NYHA class II, euvolemic, continue metoprolol  50 mg in the morning and 25 mg in the evening, Demadex  20 mg daily.  Valve leaflet thrombosis - on warfarin with INR goal 2.5.  Dyslipidemia - will repeat FLP and CMET.  Currently on Pravachol  40 mg daily.        Dispo: Echo, CMET, amoxicillin  500 mg 4 tablets p.o. 1 hour prior to dental procedures/cleanings, fasting lipid panel, follow-up in 1 year.   Signed, Delon JAYSON Hoover, NP

## 2024-03-10 ENCOUNTER — Encounter: Payer: Self-pay | Admitting: Cardiology

## 2024-03-10 ENCOUNTER — Ambulatory Visit: Attending: Cardiology | Admitting: Cardiology

## 2024-03-10 VITALS — BP 102/76 | HR 76 | Ht 61.0 in | Wt 212.6 lb

## 2024-03-10 DIAGNOSIS — I110 Hypertensive heart disease with heart failure: Secondary | ICD-10-CM | POA: Diagnosis present

## 2024-03-10 DIAGNOSIS — I10 Essential (primary) hypertension: Secondary | ICD-10-CM | POA: Diagnosis present

## 2024-03-10 DIAGNOSIS — I2510 Atherosclerotic heart disease of native coronary artery without angina pectoris: Secondary | ICD-10-CM | POA: Diagnosis present

## 2024-03-10 DIAGNOSIS — Q2381 Bicuspid aortic valve: Secondary | ICD-10-CM

## 2024-03-10 DIAGNOSIS — I5032 Chronic diastolic (congestive) heart failure: Secondary | ICD-10-CM | POA: Diagnosis present

## 2024-03-10 DIAGNOSIS — I350 Nonrheumatic aortic (valve) stenosis: Secondary | ICD-10-CM

## 2024-03-10 DIAGNOSIS — I509 Heart failure, unspecified: Secondary | ICD-10-CM | POA: Diagnosis present

## 2024-03-10 MED ORDER — AMOXICILLIN 500 MG PO TABS: 2000.0000 mg | ORAL_TABLET | ORAL | 3 refills | Status: AC | PRN

## 2024-03-10 NOTE — Patient Instructions (Addendum)
 Medication Instructions:   No changes   Your physician recommends that you continue on your current medications as directed. Please refer to the Current Medication list given to you today.  *If you need a refill on your cardiac medications before your next appointment, please call your pharmacy*  Lab Work:  CBC, CMET, FLP   If you have labs (blood work) drawn today and your tests are completely normal, you will receive your results only by: MyChart Message (if you have MyChart) OR A paper copy in the mail If you have any lab test that is abnormal or we need to change your treatment, we will call you to review the results.  Testing/Procedures:  Your physician has requested that you have an echocardiogram. Echocardiography is a painless test that uses sound waves to create images of your heart. It provides your doctor with information about the size and shape of your heart and how well your heart's chambers and valves are working. This procedure takes approximately one hour. There are no restrictions for this procedure. Please do NOT wear cologne, perfume, aftershave, or lotions (deodorant is allowed). Please arrive 15 minutes prior to your appointment time.  Please note: We ask at that you not bring children with you during ultrasound (echo/ vascular) testing. Due to room size and safety concerns, children are not allowed in the ultrasound rooms during exams. Our front office staff cannot provide observation of children in our lobby area while testing is being conducted. An adult accompanying a patient to their appointment will only be allowed in the ultrasound room at the discretion of the ultrasound technician under special circumstances. We apologize for any inconvenience.   Follow-Up: At [facility], you and your health needs are our priority.  As part of our continuing mission to provide you with exceptional heart care, our providers are all part of one team.  This team  includes your primary Cardiologist (physician) and Advanced Practice Providers or APPs (Physician Assistants and Nurse Practitioners) who all work together to provide you with the care you need, when you need it.  Your next appointment:   1 year month(s)  Provider:   Redell Leiter, MD    We recommend signing up for the patient portal called MyChart.  Sign up information is provided on this After Visit Summary.  MyChart is used to connect with patients for Virtual Visits (Telemedicine).  Patients are able to view lab/test results, encounter notes, upcoming appointments, etc.  Non-urgent messages can be sent to your provider as well.   To learn more about what you can do with MyChart, go to [email or web address].   Other Instructions

## 2024-03-11 ENCOUNTER — Ambulatory Visit: Payer: Self-pay | Admitting: Cardiology

## 2024-03-11 LAB — COMPREHENSIVE METABOLIC PANEL WITH GFR
ALT: 13 IU/L (ref 0–32)
AST: 14 IU/L (ref 0–40)
Albumin: 4.3 g/dL (ref 3.9–4.9)
Alkaline Phosphatase: 57 IU/L (ref 44–121)
BUN/Creatinine Ratio: 11 — ABNORMAL LOW (ref 12–28)
BUN: 15 mg/dL (ref 8–27)
Bilirubin Total: 0.4 mg/dL (ref 0.0–1.2)
CO2: 22 mmol/L (ref 20–29)
Calcium: 9.8 mg/dL (ref 8.7–10.3)
Chloride: 100 mmol/L (ref 96–106)
Creatinine, Ser: 1.38 mg/dL — ABNORMAL HIGH (ref 0.57–1.00)
Globulin, Total: 2.7 g/dL (ref 1.5–4.5)
Glucose: 97 mg/dL (ref 70–99)
Potassium: 4 mmol/L (ref 3.5–5.2)
Sodium: 141 mmol/L (ref 134–144)
Total Protein: 7 g/dL (ref 6.0–8.5)
eGFR: 41 mL/min/1.73 — ABNORMAL LOW

## 2024-03-11 LAB — CBC WITH DIFFERENTIAL/PLATELET
Basophils Absolute: 0.1 x10E3/uL (ref 0.0–0.2)
Basos: 1 %
EOS (ABSOLUTE): 0.3 x10E3/uL (ref 0.0–0.4)
Eos: 3 %
Hematocrit: 46.9 % — ABNORMAL HIGH (ref 34.0–46.6)
Hemoglobin: 15.1 g/dL (ref 11.1–15.9)
Immature Grans (Abs): 0 x10E3/uL (ref 0.0–0.1)
Immature Granulocytes: 0 %
Lymphocytes Absolute: 2 x10E3/uL (ref 0.7–3.1)
Lymphs: 19 %
MCH: 30.4 pg (ref 26.6–33.0)
MCHC: 32.2 g/dL (ref 31.5–35.7)
MCV: 95 fL (ref 79–97)
Monocytes Absolute: 0.7 x10E3/uL (ref 0.1–0.9)
Monocytes: 7 %
Neutrophils Absolute: 7.4 x10E3/uL — ABNORMAL HIGH (ref 1.4–7.0)
Neutrophils: 70 %
Platelets: 186 x10E3/uL (ref 150–450)
RBC: 4.96 x10E6/uL (ref 3.77–5.28)
RDW: 13.1 % (ref 11.7–15.4)
WBC: 10.5 x10E3/uL (ref 3.4–10.8)

## 2024-03-11 LAB — LIPID PANEL
Chol/HDL Ratio: 3.4 ratio (ref 0.0–4.4)
Cholesterol, Total: 157 mg/dL (ref 100–199)
HDL: 46 mg/dL
LDL Chol Calc (NIH): 77 mg/dL (ref 0–99)
Triglycerides: 201 mg/dL — ABNORMAL HIGH (ref 0–149)
VLDL Cholesterol Cal: 34 mg/dL (ref 5–40)

## 2024-03-15 ENCOUNTER — Other Ambulatory Visit: Payer: Self-pay

## 2024-03-15 MED ORDER — METOPROLOL SUCCINATE ER 25 MG PO TB24: ORAL_TABLET | ORAL | 3 refills | Status: AC

## 2024-03-16 ENCOUNTER — Ambulatory Visit: Attending: Cardiology

## 2024-03-16 DIAGNOSIS — G459 Transient cerebral ischemic attack, unspecified: Secondary | ICD-10-CM | POA: Diagnosis present

## 2024-03-16 DIAGNOSIS — Z7901 Long term (current) use of anticoagulants: Secondary | ICD-10-CM | POA: Insufficient documentation

## 2024-03-16 LAB — POCT INR: INR: 2.9 (ref 2.0–3.0)

## 2024-03-16 NOTE — Patient Instructions (Signed)
 Description   Continue taking 1 tablet daily EXCEPT 2 tablets on Mondays, Wednesdays, Fridays.    Stay consistent with greens each week  Recheck INR in 6 weeks.  [facility] [phone]

## 2024-03-16 NOTE — Progress Notes (Signed)
Please see anticoagulation encounter.

## 2024-03-29 ENCOUNTER — Ambulatory Visit: Attending: Cardiology

## 2024-03-29 DIAGNOSIS — I10 Essential (primary) hypertension: Secondary | ICD-10-CM | POA: Insufficient documentation

## 2024-03-29 DIAGNOSIS — I509 Heart failure, unspecified: Secondary | ICD-10-CM | POA: Insufficient documentation

## 2024-03-29 DIAGNOSIS — I5032 Chronic diastolic (congestive) heart failure: Secondary | ICD-10-CM | POA: Diagnosis not present

## 2024-03-29 DIAGNOSIS — I2510 Atherosclerotic heart disease of native coronary artery without angina pectoris: Secondary | ICD-10-CM | POA: Insufficient documentation

## 2024-03-29 DIAGNOSIS — Q2381 Bicuspid aortic valve: Secondary | ICD-10-CM | POA: Insufficient documentation

## 2024-03-29 DIAGNOSIS — I110 Hypertensive heart disease with heart failure: Secondary | ICD-10-CM | POA: Insufficient documentation

## 2024-03-29 DIAGNOSIS — I350 Nonrheumatic aortic (valve) stenosis: Secondary | ICD-10-CM | POA: Insufficient documentation

## 2024-03-29 LAB — ECHOCARDIOGRAM COMPLETE
AR max vel: 2.7 cm2
AV Area VTI: 3.09 cm2
AV Area mean vel: 2.92 cm2
AV Mean grad: 3.3 mmHg
AV Peak grad: 6.9 mmHg
Ao pk vel: 1.32 m/s
Area-P 1/2: 2.48 cm2
MV VTI: 1.95 cm2
S' Lateral: 2.9 cm

## 2024-03-31 ENCOUNTER — Encounter: Payer: Self-pay | Admitting: Adult Health

## 2024-03-31 ENCOUNTER — Telehealth: Admitting: Adult Health

## 2024-03-31 DIAGNOSIS — F319 Bipolar disorder, unspecified: Secondary | ICD-10-CM | POA: Diagnosis not present

## 2024-03-31 MED ORDER — LAMOTRIGINE 100 MG PO TABS: ORAL_TABLET | ORAL | 2 refills | Status: DC

## 2024-03-31 NOTE — Progress Notes (Signed)
 [name] [id number] [birth year] 68 y.o.  Virtual Visit via Video Note  I connected with pt @ on 03/31/24 at  1:00 PM EDT by a video enabled telemedicine application and verified that I am speaking with the correct person using two identifiers.   I discussed the limitations of evaluation and management by telemedicine and the availability of in person appointments. The patient expressed understanding and agreed to proceed.  I discussed the assessment and treatment plan with the patient. The patient was provided an opportunity to ask questions and all were answered. The patient agreed with the plan and demonstrated an understanding of the instructions.   The patient was advised to call back or seek an in-person evaluation if the symptoms worsen or if the condition fails to improve as anticipated.  I provided 25 minutes of non-face-to-face time during this encounter.  The patient was located at home.  The provider was located at [facility].   Angeline LOISE Sayers, NP   Subjective:   Patient ID:  [name] is a 68 y.o. (DOB [birth year]) female.  Chief Complaint: No chief complaint on file.   HPI [name] presents for follow-up of Bipolar Disorder.  Describes mood today as ok. Pleasant. Denies tearfulness. Mood symptoms - reports feeling depressed at times. Reports feeling depressed 3 out 7 days. Reports her depression as a 3 to 4 out of 10. Reports some anxiety. Denies irritability. Reports varying interest and motivation. Reports watching TV and reading. Reports completing some ADL's. Denies panic attacks. Reports some worry at times. Denies over thinking and rumination. Denies mania. Reports mood as improved. Stating I feel like I'm about the same. Reports taking medications as prescribed.  Energy levels are lower- about the same. Active, does not have a regular exercise routine with physical limitations. Attends silver sneakers once a week. Enjoys usual  interests and activities. Reports reading, doing quizzes, watching TV and talking to friends. Married. Lives with husband.  Has a son and a daughter. Spending time with family. Appetite adequate. Weight fluctuates. Sleeps better some nights than others. Averages 4 to 5 hours of broken sleep - sleeping in recliner. Reports some daytime napping. Focus and concentration improved - it's better - doing puzzles, quizzes and math problems. Completing tasks. Managing minimal aspects of household. Retired. Denies SI or HI.  Denies AH or VH. Denies self harm. Denies substance use. Denies paranoia.  Previous medication trials: Pristiq, Wellbutrin , Cymbalta , other medications    Review of Systems:  Review of Systems  Musculoskeletal:  Negative for gait problem.  Neurological:  Negative for tremors.  Psychiatric/Behavioral:         Please refer to HPI    Medications: I have reviewed the patient's current medications.  Current Outpatient Medications  Medication Sig Dispense Refill   acetaminophen  (TYLENOL ) 325 MG tablet Take 2 tablets (650 mg total) by mouth every 6 (six) hours as needed for mild pain or headache. 30 tablet    amoxicillin  (AMOXIL ) 500 MG tablet Take 4 tablets (2,000 mg total) by mouth as needed. 4 tablet 3   buPROPion  (WELLBUTRIN  XL) 150 MG 24 hr tablet Take 1 tablet (150 mg total) by mouth daily. 90 tablet 0   buPROPion  (WELLBUTRIN  XL) 300 MG 24 hr tablet Take 1 tablet (300 mg total) by mouth daily. 90 tablet 0   camphor-menthol  (SARNA) lotion Apply 1 application topically as needed for itching. (Patient not taking: Reported on 03/10/2024) 222 mL 0   cariprazine  (VRAYLAR ) 1.5 MG  capsule Take 1 capsule (1.5 mg total) by mouth daily. 30 capsule 2   docusate sodium  (COLACE) 100 MG capsule Take 200 mg by mouth daily.     Ferrous Sulfate  Dried (HIGH POTENCY IRON) 65 MG TABS Take 1 tablet by mouth daily.     folic acid  (FOLVITE ) 1 MG tablet Take 1 tablet (1 mg total) by mouth daily.      lamoTRIgine  (LAMICTAL ) 25 MG tablet Take one tablet at bedtime for 2 weeks, then increase to two tablets at bedtime. 60 tablet 0   levonorgestrel (MIRENA) 20 MCG/DAY IUD 1 each by Intrauterine route once.     MELATONIN PO Take 1 tablet by mouth at bedtime.     metoprolol  succinate (TOPROL -XL) 25 MG 24 hr tablet Take 2 tablets (50 mg total) by mouth every morning AND 1 tablet (25 mg total) every evening. 270 tablet 3   oxybutynin  (DITROPAN -XL) 5 MG 24 hr tablet Take 5 mg by mouth every evening.     potassium chloride  (KLOR-CON ) 10 MEQ tablet Take 10 mEq by mouth 3 (three) times daily.     pravastatin  (PRAVACHOL ) 40 MG tablet Take 1 tablet (40 mg total) by mouth daily at 6 PM. 30 tablet 0   senna-docusate (SENOKOT-S) 8.6-50 MG tablet Take 1 tablet by mouth 2 (two) times daily. (Patient not taking: Reported on 03/10/2024)     torsemide  (DEMADEX ) 20 MG tablet Take 1 tablet (20 mg total) by mouth daily. Please take torsemide  20 mg twice daily if weight greater than 210lbs. 90 tablet 3   traZODone  (DESYREL ) 50 MG tablet Take 1 to 2 tablets by mouth at bedtime for sleep. 180 tablet 0   vitamin B-12 1000 MCG tablet Take 1 tablet (1,000 mcg total) by mouth daily.     warfarin (COUMADIN ) 1 MG tablet TAKE 1 TO 2 TABLETS BY MOUTH ONCE DAILY AS DIRECTED BY  COUMADIN   CLINIC 45 tablet 0   warfarin (COUMADIN ) 1 MG tablet TAKE 1 TO 2 (ONE TO TWO) TABLETS BY MOUTH ONCE DAILY AS  DIRECTED  BY  COUMADIN   CLINIC 145 tablet 1   No current facility-administered medications for this visit.    Medication Side Effects: None  Allergies:  Allergies  Allergen Reactions   Vancomycin  Itching    Patient can receive vancomycin  with slower infusion and prn benadryl  for itching  Patient can receive vancomycin  with slower infusion and prn benadryl  for itching     Patient can receive vancomycin  with slower infusion and prn benadryl  for itching    Past Medical History:  Diagnosis Date   Anemia 06/03/2019   Aortic  stenosis 03/23/2013   Overview:  02/18/13 TTE EF >55%. Critical AS with mean Ao valve gradient of 82 mm Hg. No AI. No MR, PR, mild TR. Estimated RVSP 30 mm Hg.   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 Postmenopausal bleeding since for the past 2 years.  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 Last biopsy was in May of 2019 which showed weakly proliferative endometrium possibly a polyp.     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    Family History  Problem Relation Age of Onset   Parkinson's disease Father    Heart attack Mother     Social History   Socioeconomic History   Marital status: Married    Spouse name: Not on file   Number of children: Not on file   Years of education: Not on file   Highest education level: Not on file  Occupational History   Not on file  Tobacco Use   Smoking status: Never   Smokeless tobacco: Never   Vaping Use   Vaping status: Never Used  Substance and Sexual Activity   Alcohol use: No   Drug use: No   Sexual activity: Not Currently  Other Topics Concern   Not on file  Social History Narrative   Not on file   Social Drivers of Health   Financial Resource Strain: Low Risk  (09/04/2022)   Received [facility]   Overall Financial Resource Strain (CARDIA)    Difficulty of Paying Living Expenses: Not hard at all  Food Insecurity: Not on file  Transportation Needs: No Transportation Needs (09/06/2022)   Received from [facility] - Transportation    Lack of Transportation (Medical): No    Lack of Transportation (Non-Medical): No  Physical Activity: Not on file  Stress: No Stress Concern Present (09/04/2022)   Received from [facility] - Occupational Stress Questionnaire    Feeling of Stress : Only a little  Social Connections: Unknown (09/03/2022)   Received from [facility]   Social Network    Social Network: Not on file  Intimate Partner Violence: Unknown (09/03/2022)   Received from [facility]   HITS    Physically Hurt: Not on file    Insult or Talk Down To: Not on file    Threaten Physical Harm: Not on file    Scream or Curse: Not on file    Past Medical History, Surgical history, Social history, and Family history were reviewed and updated as appropriate.   Please see review of systems for further details on the patient's review from today.   Objective:   Physical Exam:  There were no vitals taken for this visit.  Physical Exam Constitutional:      General: She is not in acute distress. Musculoskeletal:        General: No deformity.  Neurological:     Mental Status: She is alert and oriented to person, place, and time.     Coordination: Coordination normal.  Psychiatric:        Attention and Perception: Attention and perception normal. She does not perceive auditory or visual  hallucinations.        Mood and Affect: Mood normal. Mood is not anxious or depressed. Affect is not labile, blunt, angry or inappropriate.  Speech: Speech normal.        Behavior: Behavior normal.        Thought Content: Thought content normal. Thought content is not paranoid or delusional. Thought content does not include homicidal or suicidal ideation. Thought content does not include homicidal or suicidal plan.        Cognition and Memory: Cognition and memory normal.        Judgment: Judgment normal.     Comments: Insight intact     Lab Review:     Component Value Date/Time   NA 141 03/10/2024 1400   K 4.0 03/10/2024 1400   CL 100 03/10/2024 1400   CO2 22 03/10/2024 1400   GLUCOSE 97 03/10/2024 1400   GLUCOSE 88 03/31/2021 0042   BUN 15 03/10/2024 1400   CREATININE 1.38 (H) 03/10/2024 1400   CALCIUM 9.8 03/10/2024 1400   PROT 7.0 03/10/2024 1400   ALBUMIN 4.3 03/10/2024 1400   AST 14 03/10/2024 1400   ALT 13 03/10/2024 1400   ALKPHOS 57 03/10/2024 1400   BILITOT 0.4 03/10/2024 1400   GFRNONAA >60 03/31/2021 0042   GFRAA 74 06/06/2020 0903       Component Value Date/Time   WBC 10.5 03/10/2024 1400   WBC 13.4 (H) 04/02/2021 0008   RBC 4.96 03/10/2024 1400   RBC 5.45 (A) 08/07/2021 0000   HGB 15.1 03/10/2024 1400   HCT 46.9 (H) 03/10/2024 1400   PLT 186 03/10/2024 1400   MCV 95 03/10/2024 1400   MCV 88 08/07/2021 0000   MCH 30.4 03/10/2024 1400   MCH 27.9 04/02/2021 0008   MCHC 32.2 03/10/2024 1400   MCHC 30.4 04/02/2021 0008   RDW 13.1 03/10/2024 1400   LYMPHSABS 2.0 03/10/2024 1400   MONOABS 0.9 03/31/2021 0042   EOSABS 0.3 03/10/2024 1400   BASOSABS 0.1 03/10/2024 1400    No results found for: POCLITH, LITHIUM   No results found for: PHENYTOIN, PHENOBARB, VALPROATE, CBMZ   .res Assessment: Plan:    Plan:  PDMP reviewed  Continue: Vraylar  1.5mg  daily for mood symptoms Trazadone 50mg  - 1 to 2 at hs Wellbutrin  XL 300mg  every  morning - denies seizure history Wellbutrin  XL 150mg  every morning  Lamictal  50mg  to 100mg  daily  25 minutes spent dedicated to the care of this patient on the date of this encounter to include pre-visit review of records, ordering of medication, post visit documentation, and face-to-face time with the patient discussing Bipolar disorder. Will increase Lamictal  50mg  daily to 100mg .  Counseled patient regarding potential benefits, risks, and side effects of Lamictal  to include potential risk of Stevens-Johnson syndrome. Advised patient to stop taking Lamictal  and contact office immediately if rash develops and to seek urgent medical attention if rash is severe and/or spreading quickly.   Husband notes patient has been through several cycles over the years and feels like it has been about 2 years each time before she starts feeling better. He does note that she seems to be making more progress than she has with previous episodes. Will continue to encourage her to get up and be more active.   RTC 4 weeks  Patient advised to contact office with any questions, adverse effects, or acute worsening in signs and symptoms.  Discussed potential metabolic side effects associated with atypical antipsychotics, as well as potential risk for movement side effects. Advised pt to contact office if movement side effects occur.    Discussed potential benefits, risks, and side effects of Depakote  and need for  period lab monitoring to assess for potential adverse effects to include obtaining CBC and LFTs.   There are no diagnoses linked to this encounter.   Please see After Visit Summary for patient specific instructions.  Future Appointments  Date Time Provider Department Center  03/31/2024  1:00 PM Macklyn Glandon Nattalie, NP [facility] None  04/27/2024  2:00 PM [facility]  [facility] None    No orders of the defined types were placed in this encounter.     -------------------------------

## 2024-04-02 ENCOUNTER — Other Ambulatory Visit: Payer: Self-pay

## 2024-04-02 DIAGNOSIS — I719 Aortic aneurysm of unspecified site, without rupture: Secondary | ICD-10-CM

## 2024-04-02 DIAGNOSIS — R9430 Abnormal result of cardiovascular function study, unspecified: Secondary | ICD-10-CM

## 2024-04-05 ENCOUNTER — Other Ambulatory Visit: Payer: Self-pay | Admitting: Cardiology

## 2024-04-05 DIAGNOSIS — I719 Aortic aneurysm of unspecified site, without rupture: Secondary | ICD-10-CM

## 2024-04-05 DIAGNOSIS — R9430 Abnormal result of cardiovascular function study, unspecified: Secondary | ICD-10-CM

## 2024-04-05 DIAGNOSIS — R0989 Other specified symptoms and signs involving the circulatory and respiratory systems: Secondary | ICD-10-CM

## 2024-04-05 DIAGNOSIS — Q230 Congenital stenosis of aortic valve: Secondary | ICD-10-CM

## 2024-04-05 DIAGNOSIS — I700 Atherosclerosis of aorta: Secondary | ICD-10-CM

## 2024-04-06 NOTE — Procedures (Signed)
 [name]

## 2024-04-13 ENCOUNTER — Ambulatory Visit (INDEPENDENT_AMBULATORY_CARE_PROVIDER_SITE_OTHER)
Admission: RE | Admit: 2024-04-13 | Discharge: 2024-04-13 | Disposition: A | Discharge status: Home / Self Care | Source: Ambulatory Visit | Attending: Cardiology | Admitting: Cardiology

## 2024-04-13 DIAGNOSIS — R9430 Abnormal result of cardiovascular function study, unspecified: Secondary | ICD-10-CM

## 2024-04-13 DIAGNOSIS — I719 Aortic aneurysm of unspecified site, without rupture: Secondary | ICD-10-CM

## 2024-04-13 DIAGNOSIS — R931 Abnormal findings on diagnostic imaging of heart and coronary circulation: Secondary | ICD-10-CM | POA: Diagnosis not present

## 2024-04-13 DIAGNOSIS — Q230 Congenital stenosis of aortic valve: Secondary | ICD-10-CM

## 2024-04-13 DIAGNOSIS — I700 Atherosclerosis of aorta: Secondary | ICD-10-CM | POA: Diagnosis not present

## 2024-04-13 DIAGNOSIS — I712 Thoracic aortic aneurysm, without rupture, unspecified: Secondary | ICD-10-CM | POA: Diagnosis not present

## 2024-04-13 DIAGNOSIS — R0989 Other specified symptoms and signs involving the circulatory and respiratory systems: Secondary | ICD-10-CM

## 2024-04-14 ENCOUNTER — Ambulatory Visit: Payer: Self-pay | Admitting: Cardiology

## 2024-04-27 ENCOUNTER — Ambulatory Visit

## 2024-04-27 ENCOUNTER — Ambulatory Visit: Payer: Self-pay | Admitting: Cardiology

## 2024-04-27 ENCOUNTER — Other Ambulatory Visit: Payer: Self-pay

## 2024-04-27 DIAGNOSIS — I719 Aortic aneurysm of unspecified site, without rupture: Secondary | ICD-10-CM

## 2024-04-27 DIAGNOSIS — I790 Aneurysm of aorta in diseases classified elsewhere: Secondary | ICD-10-CM

## 2024-05-03 ENCOUNTER — Telehealth (INDEPENDENT_AMBULATORY_CARE_PROVIDER_SITE_OTHER): Admitting: Adult Health

## 2024-05-03 ENCOUNTER — Encounter: Payer: Self-pay | Admitting: Adult Health

## 2024-05-03 DIAGNOSIS — F319 Bipolar disorder, unspecified: Secondary | ICD-10-CM | POA: Diagnosis not present

## 2024-05-03 MED ORDER — BUPROPION HCL ER (XL) 300 MG PO TB24: 300.0000 mg | ORAL_TABLET | Freq: Every day | ORAL | 0 refills | Status: DC

## 2024-05-03 MED ORDER — TRAZODONE HCL 50 MG PO TABS: ORAL_TABLET | ORAL | 0 refills | Status: DC

## 2024-05-03 MED ORDER — BUPROPION HCL ER (XL) 150 MG PO TB24
150.0000 mg | ORAL_TABLET | Freq: Every day | ORAL | 0 refills | Status: DC
Start: 2024-05-03 — End: 2024-06-14

## 2024-05-03 MED ORDER — LAMOTRIGINE 150 MG PO TABS: ORAL_TABLET | ORAL | 2 refills | Status: DC

## 2024-05-03 MED ORDER — CARIPRAZINE HCL 1.5 MG PO CAPS: 1.5000 mg | ORAL_CAPSULE | Freq: Every day | ORAL | 2 refills | Status: DC

## 2024-05-03 NOTE — Progress Notes (Signed)
 [name] [id number] 1955/12/27 68 y.o.  Virtual Visit via Video Note  I connected with pt @ on 05/03/24 at  3:00 PM EDT by a video enabled telemedicine application and verified that I am speaking with the correct person using two identifiers.   I discussed the limitations of evaluation and management by telemedicine and the availability of in person appointments. The patient expressed understanding and agreed to proceed.  I discussed the assessment and treatment plan with the patient. The patient was provided an opportunity to ask questions and all were answered. The patient agreed with the plan and demonstrated an understanding of the instructions.   The patient was advised to call back or seek an in-person evaluation if the symptoms worsen or if the condition fails to improve as anticipated.  I provided 25 minutes of non-face-to-face time during this encounter.  The patient was located at home.  The provider was located at [facility].   Angeline LOISE Sayers, NP   Subjective:   Patient ID:  [name] is a 68 y.o. (DOB 1956/01/11) female.  Chief Complaint: No chief complaint on file.   HPI [name] presents for follow-up of Bipolar Disorder.  Describes mood today as ok. Pleasant. Denies tearfulness. Mood symptoms - denies anxiety, depression or irritability.  Reports feeling depressed maybe 1 to 2 days a week. Reports her depression as better - 3 out of 10. Reports varying interest and motivation. Reports watching TV and reading in the recliner most of the time. Reports completing some ADL's - showering some days. Denies panic attacks. Reports some worry at times. Denies over thinking and rumination. Denies mania. Reports mood as improved. Stating I feel like I'm doing some better. Reports taking medications as prescribed.  Energy levels are lower- maybe a little better. Active, does not have a regular exercise routine with physical limitations.  Enjoys  usual interests and activities. Reports reading, doing quizzes, watching TV and talking to friends. Married. Lives with husband.  Has a son and a daughter. Spending time with family. Appetite adequate. Weight fluctuates. Sleeps better some nights than others. Averages 4 hours of broken sleep at night. Reports some daytime napping. Focus and concentration improved - it's better - reading magazines - doing puzzles. Completing tasks. Managing minimal aspects of household. Retired. Denies SI or HI.  Denies AH or VH. Denies self harm. Denies substance use. Denies paranoia.  Previous medication trials: Pristiq, Wellbutrin , Cymbalta , other medications      Review of Systems:  Review of Systems  Musculoskeletal:  Negative for gait problem.  Neurological:  Negative for tremors.  Psychiatric/Behavioral:         Please refer to HPI    Medications: I have reviewed the patient's current medications.  Current Outpatient Medications  Medication Sig Dispense Refill   acetaminophen  (TYLENOL ) 325 MG tablet Take 2 tablets (650 mg total) by mouth every 6 (six) hours as needed for mild pain or headache. 30 tablet    amoxicillin  (AMOXIL ) 500 MG tablet Take 4 tablets (2,000 mg total) by mouth as needed. 4 tablet 3   buPROPion  (WELLBUTRIN  XL) 150 MG 24 hr tablet Take 1 tablet (150 mg total) by mouth daily. 90 tablet 0   buPROPion  (WELLBUTRIN  XL) 300 MG 24 hr tablet Take 1 tablet (300 mg total) by mouth daily. 90 tablet 0   camphor-menthol  (SARNA) lotion Apply 1 application topically as needed for itching. (Patient not taking: Reported on 03/10/2024) 222 mL 0   cariprazine  (VRAYLAR )  1.5 MG capsule Take 1 capsule (1.5 mg total) by mouth daily. 30 capsule 2   docusate sodium  (COLACE) 100 MG capsule Take 200 mg by mouth daily.     Ferrous Sulfate  Dried (HIGH POTENCY IRON) 65 MG TABS Take 1 tablet by mouth daily.     folic acid  (FOLVITE ) 1 MG tablet Take 1 tablet (1 mg total) by mouth daily.     lamoTRIgine   (LAMICTAL ) 100 MG tablet Take one tablet at bedtime. 30 tablet 2   levonorgestrel (MIRENA) 20 MCG/DAY IUD 1 each by Intrauterine route once.     MELATONIN PO Take 1 tablet by mouth at bedtime.     metoprolol  succinate (TOPROL -XL) 25 MG 24 hr tablet Take 2 tablets (50 mg total) by mouth every morning AND 1 tablet (25 mg total) every evening. 270 tablet 3   oxybutynin  (DITROPAN -XL) 5 MG 24 hr tablet Take 5 mg by mouth every evening.     potassium chloride  (KLOR-CON ) 10 MEQ tablet Take 10 mEq by mouth 3 (three) times daily.     pravastatin  (PRAVACHOL ) 40 MG tablet Take 1 tablet (40 mg total) by mouth daily at 6 PM. 30 tablet 0   senna-docusate (SENOKOT-S) 8.6-50 MG tablet Take 1 tablet by mouth 2 (two) times daily. (Patient not taking: Reported on 03/10/2024)     torsemide  (DEMADEX ) 20 MG tablet Take 1 tablet (20 mg total) by mouth daily. Please take torsemide  20 mg twice daily if weight greater than 210lbs. 90 tablet 3   traZODone  (DESYREL ) 50 MG tablet Take 1 to 2 tablets by mouth at bedtime for sleep. 180 tablet 0   vitamin B-12 1000 MCG tablet Take 1 tablet (1,000 mcg total) by mouth daily.     warfarin (COUMADIN ) 1 MG tablet TAKE 1 TO 2 TABLETS BY MOUTH ONCE DAILY AS DIRECTED BY  COUMADIN   CLINIC 45 tablet 0   warfarin (COUMADIN ) 1 MG tablet TAKE 1 TO 2 (ONE TO TWO) TABLETS BY MOUTH ONCE DAILY AS  DIRECTED  BY  COUMADIN   CLINIC 145 tablet 1   No current facility-administered medications for this visit.    Medication Side Effects: None  Allergies:  Allergies  Allergen Reactions   Vancomycin  Itching    Patient can receive vancomycin  with slower infusion and prn benadryl  for itching  Patient can receive vancomycin  with slower infusion and prn benadryl  for itching     Patient can receive vancomycin  with slower infusion and prn benadryl  for itching    Past Medical History:  Diagnosis Date   Anemia 06/03/2019   Aortic stenosis 03/23/2013   Overview:  02/18/13 TTE EF >55%. Critical AS with  mean Ao valve gradient of 82 mm Hg. No AI. No MR, PR, mild TR. Estimated RVSP 30 mm Hg.   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 Postmenopausal bleeding since for the past 2 years.  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 Last biopsy was in May of 2019 which showed weakly proliferative endometrium possibly a polyp.     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    Family History  Problem Relation Age of Onset   Parkinson's disease Father    Heart attack Mother     Social History   Socioeconomic History   Marital status: Married    Spouse name: Not on file   Number of children: Not on file   Years of education: Not on file   Highest education level: Not on file  Occupational History   Not on file  Tobacco Use   Smoking status: Never   Smokeless tobacco: Never  Vaping Use   Vaping status: Never Used  Substance and Sexual Activity    Alcohol use: No   Drug use: No   Sexual activity: Not Currently  Other Topics Concern   Not on file  Social History Narrative   Not on file   Social Drivers of Health   Financial Resource Strain: Low Risk  (09/04/2022)   [facility]   Overall Financial Resource Strain (CARDIA)    Difficulty of Paying Living Expenses: Not hard at all  Food Insecurity: Not on file  Transportation Needs: No Transportation Needs (09/06/2022)   Received from [facility] - Transportation    Lack of Transportation (Medical): No    Lack of Transportation (Non-Medical): No  Physical Activity: Not on file  Stress: No Stress Concern Present (09/04/2022)   Received from [facility] - Occupational Stress Questionnaire    Feeling of Stress : Only a little  Social Connections: Unknown (09/03/2022)   Received from [facility]   Social Network    Social Network: Not on file  Intimate Partner Violence: Unknown (09/03/2022)   Received from [facility]   HITS    Physically Hurt: Not on file    Insult or Talk Down To: Not on file    Threaten Physical Harm: Not on file    Scream or Curse: Not on file    Past Medical History, Surgical history, Social history, and Family history were reviewed and updated as appropriate.   Please see review of systems for further details on the patient's review from today.   Objective:   Physical Exam:  There were no vitals taken for this visit.  Physical Exam Constitutional:      General: She is not in acute distress. Musculoskeletal:        General: No deformity.  Neurological:     Mental Status: She is alert and oriented to person, place, and time.     Coordination: Coordination normal.  Psychiatric:        Attention and Perception: Attention and perception normal. She does not perceive auditory or visual hallucinations.        Mood and Affect: Mood normal. Mood is not anxious or  depressed. Affect is not labile, blunt, angry or inappropriate.        Speech: Speech normal.  Behavior: Behavior normal.        Thought Content: Thought content normal. Thought content is not paranoid or delusional. Thought content does not include homicidal or suicidal ideation. Thought content does not include homicidal or suicidal plan.        Cognition and Memory: Cognition and memory normal.        Judgment: Judgment normal.     Comments: Insight intact     Lab Review:     Component Value Date/Time   NA 141 03/10/2024 1400   K 4.0 03/10/2024 1400   CL 100 03/10/2024 1400   CO2 22 03/10/2024 1400   GLUCOSE 97 03/10/2024 1400   GLUCOSE 88 03/31/2021 0042   BUN 15 03/10/2024 1400   CREATININE 1.38 (H) 03/10/2024 1400   CALCIUM 9.8 03/10/2024 1400   PROT 7.0 03/10/2024 1400   ALBUMIN 4.3 03/10/2024 1400   AST 14 03/10/2024 1400   ALT 13 03/10/2024 1400   ALKPHOS 57 03/10/2024 1400   BILITOT 0.4 03/10/2024 1400   GFRNONAA >60 03/31/2021 0042   GFRAA 74 06/06/2020 0903       Component Value Date/Time   WBC 10.5 03/10/2024 1400   WBC 13.4 (H) 04/02/2021 0008   RBC 4.96 03/10/2024 1400   RBC 5.45 (A) 08/07/2021 0000   HGB 15.1 03/10/2024 1400   HCT 46.9 (H) 03/10/2024 1400   PLT 186 03/10/2024 1400   MCV 95 03/10/2024 1400   MCV 88 08/07/2021 0000   MCH 30.4 03/10/2024 1400   MCH 27.9 04/02/2021 0008   MCHC 32.2 03/10/2024 1400   MCHC 30.4 04/02/2021 0008   RDW 13.1 03/10/2024 1400   LYMPHSABS 2.0 03/10/2024 1400   MONOABS 0.9 03/31/2021 0042   EOSABS 0.3 03/10/2024 1400   BASOSABS 0.1 03/10/2024 1400    No results found for: POCLITH, LITHIUM   No results found for: PHENYTOIN, PHENOBARB, VALPROATE, CBMZ   .res Assessment: Plan:   Plan:  PDMP reviewed  Continue: Vraylar  1.5mg  daily for mood symptoms Trazadone 50mg  - 1 to 2 at hs Wellbutrin  XL 300mg  every morning - denies seizure history Wellbutrin  XL 150mg  every morning  Increase  Lamictal  100mg  to 150mg  daily  25 minutes spent dedicated to the care of this patient on the date of this encounter to include pre-visit review of records, ordering of medication, post visit documentation, and face-to-face time with the patient discussing Bipolar disorder. Will increase Lamictal  50mg  daily to 100mg .  Counseled patient regarding potential benefits, risks, and side effects of Lamictal  to include potential risk of Stevens-Johnson syndrome. Advised patient to stop taking Lamictal  and contact office immediately if rash develops and to seek urgent medical attention if rash is severe and/or spreading quickly.   Husband notes patient has been through several cycles over the years and feels like it has been about 2 years each time before she starts feeling better. He does note that she seems to be making more progress than she has with previous episodes. Will continue to encourage her to get up and be more active.   RTC 4 weeks  Patient advised to contact office with any questions, adverse effects, or acute worsening in signs and symptoms.  Discussed potential metabolic side effects associated with atypical antipsychotics, as well as potential risk for movement side effects. Advised pt to contact office if movement side effects occur.    Discussed potential benefits, risks, and side effects of Depakote  and need for period lab monitoring to assess for potential adverse effects to  include obtaining CBC and LFTs.   There are no diagnoses linked to this encounter.   Please see After Visit Summary for patient specific instructions.  Future Appointments  Date Time Provider Department Center  05/03/2024  3:00 PM Lucas Exline Nattalie, NP [facility] None  05/04/2024  9:45 AM [facility]  [facility] None    No orders of the defined types were placed in this encounter.     -------------------------------

## 2024-05-04 ENCOUNTER — Encounter

## 2024-05-04 ENCOUNTER — Ambulatory Visit: Attending: Cardiology

## 2024-05-04 DIAGNOSIS — Z7901 Long term (current) use of anticoagulants: Secondary | ICD-10-CM | POA: Diagnosis present

## 2024-05-04 DIAGNOSIS — G459 Transient cerebral ischemic attack, unspecified: Secondary | ICD-10-CM | POA: Insufficient documentation

## 2024-05-04 DIAGNOSIS — Z954 Presence of other heart-valve replacement: Secondary | ICD-10-CM | POA: Insufficient documentation

## 2024-05-04 LAB — POCT INR: INR: 1.3 — AB (ref 2.0–3.0)

## 2024-05-04 NOTE — Patient Instructions (Signed)
 Take 2 tablets today only then Continue taking 1 tablet daily EXCEPT 2 tablets on Mondays, Wednesdays, Fridays.    Stay consistent with greens each week  Recheck INR in 3 weeks.  Coumadin  Clinic [phone]

## 2024-05-18 ENCOUNTER — Other Ambulatory Visit: Payer: Self-pay

## 2024-05-18 DIAGNOSIS — Z5181 Encounter for therapeutic drug level monitoring: Secondary | ICD-10-CM

## 2024-05-18 MED ORDER — WARFARIN SODIUM 1 MG PO TABS: ORAL_TABLET | ORAL | 1 refills | Status: DC

## 2024-05-25 ENCOUNTER — Ambulatory Visit: Attending: Cardiology

## 2024-05-25 DIAGNOSIS — Z954 Presence of other heart-valve replacement: Secondary | ICD-10-CM | POA: Insufficient documentation

## 2024-05-25 DIAGNOSIS — Z7901 Long term (current) use of anticoagulants: Secondary | ICD-10-CM | POA: Diagnosis present

## 2024-05-25 DIAGNOSIS — G459 Transient cerebral ischemic attack, unspecified: Secondary | ICD-10-CM | POA: Insufficient documentation

## 2024-05-25 LAB — POCT INR: INR: 3.3 — AB (ref 2.0–3.0)

## 2024-05-25 NOTE — Patient Instructions (Signed)
 Continue taking 1 tablet daily EXCEPT 2 tablets on Mondays, Wednesdays, Fridays.   Eat greens tonight. Stay consistent with greens each week  Recheck INR in 5 weeks.  Coumadin  Clinic [phone]

## 2024-05-31 ENCOUNTER — Telehealth (INDEPENDENT_AMBULATORY_CARE_PROVIDER_SITE_OTHER): Admitting: Adult Health

## 2024-05-31 ENCOUNTER — Encounter: Payer: Self-pay | Admitting: Adult Health

## 2024-05-31 DIAGNOSIS — F319 Bipolar disorder, unspecified: Secondary | ICD-10-CM

## 2024-05-31 NOTE — Progress Notes (Signed)
 [name] [id number] [birth year] 68 y.o.  Virtual Visit via Video Note  I connected with pt @ on 05/31/24 at  3:30 PM EDT by a video enabled telemedicine application and verified that I am speaking with the correct person using two identifiers.   I discussed the limitations of evaluation and management by telemedicine and the availability of in person appointments. The patient expressed understanding and agreed to proceed.  I discussed the assessment and treatment plan with the patient. The patient was provided an opportunity to ask questions and all were answered. The patient agreed with the plan and demonstrated an understanding of the instructions.   The patient was advised to call back or seek an in-person evaluation if the symptoms worsen or if the condition fails to improve as anticipated.  I provided 25 minutes of non-face-to-face time during this encounter.  The patient was located at home.  The provider was located at [facility].   Angeline LOISE Sayers, NP   Subjective:   Patient ID:  [name] is a 68 y.o. (DOB Sep 05, 1956) female.  Chief Complaint: No chief complaint on file.   HPI KIZZI OVERBEY presents for follow-up of Bipolar Disorder.  Accompanied by husband - spoke at end of interview.  Describes mood today as ok. Pleasant. Denies tearfulness. Mood symptoms - reports anxiety some days. Reports some depression - 3 out of 10 - not doing things - denies feeling sad. Denies irritability. Reports lower interest and motivation - about the same. Reports getting out recently for husbands birthday. Reports going out for lunch once a week. Reports watching TV and completing cross word puzzles. Reports staying in the recliner most of the time. Reports completing some ADL's - showering once a week - brushing teeth every day. Denies panic attacks. Reports some worry at times. Reports some over thinking and rumination. Denies mania. Reports mood as stable.  Stating I feel like I'm doing ok - I do wish I was more motivated to do things. Reports taking medications as prescribed.  Energy levels are lower- I wish I had more. Active, does not have a regular exercise routine with physical limitations.  Enjoys usual interests and activities. Reports reading, doing quizzes, watching TV and talking to friends. Married. Lives with husband.  Has a son and a daughter. Spending time with family. Appetite adequate. Weight fluctuates. Sleeps better some nights than others. Averages 4 to 5 hours night. Reports some daytime napping. Focus and concentration improved- reading magazines - doing puzzles. Completing tasks. Managing minimal aspects of household. Retired. Denies SI or HI.  Denies AH or VH. Denies self harm. Denies substance use. Denies paranoia.  Previous medication trials: Pristiq, Wellbutrin , Cymbalta , other medications  Review of Systems:  Review of Systems  Musculoskeletal:  Negative for gait problem.  Neurological:  Negative for tremors.  Psychiatric/Behavioral:         Please refer to HPI    Medications: I have reviewed the patient's current medications.  Current Outpatient Medications  Medication Sig Dispense Refill   acetaminophen  (TYLENOL ) 325 MG tablet Take 2 tablets (650 mg total) by mouth every 6 (six) hours as needed for mild pain or headache. 30 tablet    amoxicillin  (AMOXIL ) 500 MG tablet Take 4 tablets (2,000 mg total) by mouth as needed. 4 tablet 3   buPROPion  (WELLBUTRIN  XL) 150 MG 24 hr tablet Take 1 tablet (150 mg total) by mouth daily. 90 tablet 0   buPROPion  (WELLBUTRIN  XL) 300 MG 24 hr tablet  Take 1 tablet (300 mg total) by mouth daily. 90 tablet 0   camphor-menthol  (SARNA) lotion Apply 1 application topically as needed for itching. (Patient not taking: Reported on 03/10/2024) 222 mL 0   cariprazine  (VRAYLAR ) 1.5 MG capsule Take 1 capsule (1.5 mg total) by mouth daily. 30 capsule 2   docusate sodium  (COLACE) 100 MG  capsule Take 200 mg by mouth daily.     Ferrous Sulfate  Dried (HIGH POTENCY IRON) 65 MG TABS Take 1 tablet by mouth daily.     folic acid  (FOLVITE ) 1 MG tablet Take 1 tablet (1 mg total) by mouth daily.     lamoTRIgine  (LAMICTAL ) 150 MG tablet Take one tablet at bedtime. 30 tablet 2   levonorgestrel (MIRENA) 20 MCG/DAY IUD 1 each by Intrauterine route once.     MELATONIN PO Take 1 tablet by mouth at bedtime.     metoprolol  succinate (TOPROL -XL) 25 MG 24 hr tablet Take 2 tablets (50 mg total) by mouth every morning AND 1 tablet (25 mg total) every evening. 270 tablet 3   oxybutynin  (DITROPAN -XL) 5 MG 24 hr tablet Take 5 mg by mouth every evening.     potassium chloride  (KLOR-CON ) 10 MEQ tablet Take 10 mEq by mouth 3 (three) times daily.     pravastatin  (PRAVACHOL ) 40 MG tablet Take 1 tablet (40 mg total) by mouth daily at 6 PM. 30 tablet 0   senna-docusate (SENOKOT-S) 8.6-50 MG tablet Take 1 tablet by mouth 2 (two) times daily. (Patient not taking: Reported on 03/10/2024)     torsemide  (DEMADEX ) 20 MG tablet Take 1 tablet (20 mg total) by mouth daily. Please take torsemide  20 mg twice daily if weight greater than 210lbs. 90 tablet 3   traZODone  (DESYREL ) 50 MG tablet Take 1 to 2 tablets by mouth at bedtime for sleep. 180 tablet 0   vitamin B-12 1000 MCG tablet Take 1 tablet (1,000 mcg total) by mouth daily.     warfarin (COUMADIN ) 1 MG tablet TAKE 1 TO 2 TABLETS BY MOUTH ONCE DAILY AS DIRECTED BY  COUMADIN   CLINIC 45 tablet 0   warfarin (COUMADIN ) 1 MG tablet TAKE 1 TO 2 (ONE TO TWO) TABLETS BY MOUTH ONCE DAILY AS  DIRECTED  BY  COUMADIN   CLINIC 145 tablet 1   No current facility-administered medications for this visit.    Medication Side Effects: None  Allergies:  Allergies  Allergen Reactions   Vancomycin  Itching    Patient can receive vancomycin  with slower infusion and prn benadryl  for itching  Patient can receive vancomycin  with slower infusion and prn benadryl  for itching     Patient  can receive vancomycin  with slower infusion and prn benadryl  for itching    Past Medical History:  Diagnosis Date   Anemia 06/03/2019   Aortic stenosis 03/23/2013   Overview:  02/18/13 TTE EF >55%. Critical AS with mean Ao valve gradient of 82 mm Hg. No AI. No MR, PR, mild TR. Estimated RVSP 30 mm Hg.   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 Postmenopausal bleeding since for the past 2 years.  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 Last biopsy was in May of 2019 which showed weakly proliferative endometrium possibly a polyp.     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    Family History  Problem Relation Age of Onset   Parkinson's disease Father    Heart attack Mother     Social History   Socioeconomic History   Marital status: Married    Spouse name: Not on file   Number of children: Not on file   Years of education: Not on file    Highest education level: Not on file  Occupational History   Not on file  Tobacco Use   Smoking status: Never   Smokeless tobacco: Never  Vaping Use   Vaping status: Never Used  Substance and Sexual Activity   Alcohol use: No   Drug use: No   Sexual activity: Not Currently  Other Topics Concern   Not on file  Social History Narrative   Not on file   Social Drivers of Health   Financial Resource Strain: Low Risk  (09/04/2022)   Received from [facility]   Overall Financial Resource Strain (CARDIA)    Difficulty of Paying Living Expenses: Not hard at all  Food Insecurity: Not on file  Transportation Needs: No Transportation Needs (09/06/2022)   Received from [facility] - Transportation    Lack of Transportation (Medical): No    Lack of Transportation (Non-Medical): No  Physical Activity: Not on file  Stress: No Stress Concern Present (09/04/2022)   Received from [facility] - Occupational Stress Questionnaire    Feeling of Stress : Only a little  Social Connections: Unknown (09/03/2022)   Received from [facility]   Social Network    Social Network: Not on file  Intimate Partner Violence: Unknown (09/03/2022)   Received from [facility]   HITS    Physically Hurt: Not on file    Insult or Talk Down To: Not on file    Threaten Physical Harm: Not on file    Scream or Curse: Not on file    Past Medical History, Surgical history, Social history, and Family history were reviewed and updated as appropriate.   Please see review of systems for further details on the patient's review from today.   Objective:   Physical Exam:  There were no vitals taken for this visit.  Physical Exam Constitutional:      General: She is not in acute distress. Musculoskeletal:        General: No deformity.  Neurological:     Mental Status: She is alert and oriented to person, place, and time.     Coordination:  Coordination normal.  Psychiatric:        Attention and Perception: Attention and perception normal. She does not perceive auditory or visual hallucinations.  Mood and Affect: Mood normal. Mood is not anxious or depressed. Affect is not labile, blunt, angry or inappropriate.        Speech: Speech normal.        Behavior: Behavior normal.        Thought Content: Thought content normal. Thought content is not paranoid or delusional. Thought content does not include homicidal or suicidal ideation. Thought content does not include homicidal or suicidal plan.        Cognition and Memory: Cognition and memory normal.        Judgment: Judgment normal.     Comments: Insight intact     Lab Review:     Component Value Date/Time   NA 141 03/10/2024 1400   K 4.0 03/10/2024 1400   CL 100 03/10/2024 1400   CO2 22 03/10/2024 1400   GLUCOSE 97 03/10/2024 1400   GLUCOSE 88 03/31/2021 0042   BUN 15 03/10/2024 1400   CREATININE 1.38 (H) 03/10/2024 1400   CALCIUM 9.8 03/10/2024 1400   PROT 7.0 03/10/2024 1400   ALBUMIN 4.3 03/10/2024 1400   AST 14 03/10/2024 1400   ALT 13 03/10/2024 1400   ALKPHOS 57 03/10/2024 1400   BILITOT 0.4 03/10/2024 1400   GFRNONAA >60 03/31/2021 0042   GFRAA 74 06/06/2020 0903       Component Value Date/Time   WBC 10.5 03/10/2024 1400   WBC 13.4 (H) 04/02/2021 0008   RBC 4.96 03/10/2024 1400   RBC 5.45 (A) 08/07/2021 0000   HGB 15.1 03/10/2024 1400   HCT 46.9 (H) 03/10/2024 1400   PLT 186 03/10/2024 1400   MCV 95 03/10/2024 1400   MCV 88 08/07/2021 0000   MCH 30.4 03/10/2024 1400   MCH 27.9 04/02/2021 0008   MCHC 32.2 03/10/2024 1400   MCHC 30.4 04/02/2021 0008   RDW 13.1 03/10/2024 1400   LYMPHSABS 2.0 03/10/2024 1400   MONOABS 0.9 03/31/2021 0042   EOSABS 0.3 03/10/2024 1400   BASOSABS 0.1 03/10/2024 1400    No results found for: POCLITH, LITHIUM   No results found for: PHENYTOIN, PHENOBARB, VALPROATE, CBMZ   .res Assessment:  Plan:    Plan:  PDMP reviewed  Continue: Vraylar  1.5mg  daily for mood symptoms Trazadone 50mg  - 1 to 2 at hs Wellbutrin  XL 300mg  every morning - denies seizure history Wellbutrin  XL 150mg  every morning Lamictal  150mg  daily  25 minutes spent dedicated to the care of this patient on the date of this encounter to include pre-visit review of records, ordering of medication, post visit documentation, and face-to-face time with the patient discussing Bipolar disorder. Patient's mood has improved, but her motivation to get up and do things is still limited. It was re-emphasized that she be more intentional about physical activity. Discussed Silver sneakers, a walking pad, walking indoors throughout the day.   Counseled patient regarding potential benefits, risks, and side effects of Lamictal  to include potential risk of Stevens-Johnson syndrome. Advised patient to stop taking Lamictal  and contact office immediately if rash develops and to seek urgent medical attention if rash is severe and/or spreading quickly.   RTC 4 weeks  Patient advised to contact office with any questions, adverse effects, or acute worsening in signs and symptoms.  Discussed potential metabolic side effects associated with atypical antipsychotics, as well as potential risk for movement side effects. Advised pt to contact office if movement side effects occur.    Discussed potential benefits, risks, and side effects of Depakote  and need for period lab monitoring  to assess for potential adverse effects to include obtaining CBC and LFTs.   There are no diagnoses linked to this encounter.   Please see After Visit Summary for patient specific instructions.  Future Appointments  Date Time Provider Department Center  05/31/2024  3:30 PM Miya Luviano Nattalie, NP CP-CP None  06/29/2024  2:00 PM [facility]  [facility] None  10/26/2024  4:30 PM [facility]-CT 1 [facility] [place]    No orders of the defined types were placed in  this encounter.     -------------------------------

## 2024-06-12 ENCOUNTER — Other Ambulatory Visit: Payer: Self-pay | Admitting: Adult Health

## 2024-06-12 DIAGNOSIS — F319 Bipolar disorder, unspecified: Secondary | ICD-10-CM

## 2024-06-29 ENCOUNTER — Ambulatory Visit: Attending: Cardiology

## 2024-06-29 DIAGNOSIS — G459 Transient cerebral ischemic attack, unspecified: Secondary | ICD-10-CM | POA: Diagnosis present

## 2024-06-29 DIAGNOSIS — Z954 Presence of other heart-valve replacement: Secondary | ICD-10-CM | POA: Insufficient documentation

## 2024-06-29 DIAGNOSIS — Z7901 Long term (current) use of anticoagulants: Secondary | ICD-10-CM | POA: Diagnosis present

## 2024-06-29 LAB — POCT INR: INR: 4.2 — AB (ref 2.0–3.0)

## 2024-06-29 NOTE — Patient Instructions (Signed)
 Hold tonight only then Continue taking 1 tablet daily EXCEPT 2 tablets on Mondays, Wednesdays, Fridays.   Stay consistent with greens each week  Recheck INR in 3 weeks.  Coumadin  Clinic [phone]

## 2024-07-01 ENCOUNTER — Telehealth: Admitting: Adult Health

## 2024-07-20 ENCOUNTER — Ambulatory Visit

## 2024-07-22 ENCOUNTER — Other Ambulatory Visit: Payer: Self-pay | Admitting: Adult Health

## 2024-07-22 DIAGNOSIS — F319 Bipolar disorder, unspecified: Secondary | ICD-10-CM

## 2024-07-27 ENCOUNTER — Ambulatory Visit: Attending: Cardiology

## 2024-07-27 DIAGNOSIS — Z954 Presence of other heart-valve replacement: Secondary | ICD-10-CM | POA: Insufficient documentation

## 2024-07-27 DIAGNOSIS — G459 Transient cerebral ischemic attack, unspecified: Secondary | ICD-10-CM | POA: Insufficient documentation

## 2024-07-27 DIAGNOSIS — Z7901 Long term (current) use of anticoagulants: Secondary | ICD-10-CM | POA: Diagnosis present

## 2024-07-27 LAB — POCT INR: INR: 4.5 — AB (ref 2.0–3.0)

## 2024-07-27 NOTE — Patient Instructions (Signed)
 Hold tonight only then Decrease to 1 tablet daily EXCEPT 2 tablets on Mondays and Fridays.   Stay consistent with greens each week  Recheck INR in 4 weeks.  Coumadin  Clinic [phone]

## 2024-07-29 ENCOUNTER — Encounter: Payer: Self-pay | Admitting: Adult Health

## 2024-07-29 ENCOUNTER — Ambulatory Visit: Admitting: Adult Health

## 2024-07-29 DIAGNOSIS — F319 Bipolar disorder, unspecified: Secondary | ICD-10-CM | POA: Diagnosis not present

## 2024-07-29 MED ORDER — CARIPRAZINE HCL 3 MG PO CAPS: 3.0000 mg | ORAL_CAPSULE | Freq: Every day | ORAL | 2 refills | Status: DC

## 2024-07-29 MED ORDER — TRAZODONE HCL 50 MG PO TABS: ORAL_TABLET | ORAL | 0 refills | Status: DC

## 2024-07-29 MED ORDER — LAMOTRIGINE 150 MG PO TABS: 150.0000 mg | ORAL_TABLET | Freq: Every day | ORAL | 1 refills | Status: DC

## 2024-07-29 NOTE — Progress Notes (Signed)
 [name] [id number] 1955-09-29 68 y.o.  Subjective:   Patient ID:  [name] is a 68 y.o. (DOB 1955-09-26) female.  Chief Complaint: No chief complaint on file.   HPI [name] presents to the office today for follow-up of Bipolar Disorder.  Accompanied by husband.  Describes mood today as ok. Pleasant. Denies tearfulness. Mood symptoms - reports feeling anxious. Reports decreased depression - 3 out of 10. Denies irritability. Reports varying interest and motivation.  Reports getting out at least every other week. Reports watching TV and completing crossword puzzles. Reports staying in the recliner most of the time - sleeping in the recliner. Reports completing some ADL's - showering once a week - brushing teeth every day. Denies panic attacks. Reports some worry, over thinking and rumination - different things. Denies mania. Reports mood as stable - it's not high or low. Stating I feel like I'm doing about the same - but could be better. Reports taking medications as prescribed.  Energy levels are about the same. Active, does not have a regular exercise routine with physical limitations.  Enjoys usual interests and activities. Reports reading, doing quizzes, watching TV and talking to friends. Married. Lives with husband.  Has a son and a daughter. Spending time with family. Appetite adequate. Weight fluctuates. Sleeps better some nights than others. Averages 4 to 5 hours night. Reports some daytime napping. Focus and concentration improved- reading magazines - doing puzzles. Completing tasks. Managing minimal aspects of household. Retired. Denies SI or HI.  Denies AH or VH. Denies self harm. Denies substance use. Denies paranoia.  Previous medication trials: Pristiq, Wellbutrin , Cymbalta , other medications   Flowsheet Row ED from 05/20/2022 in [facility] Emergency Department at [facility] ED to Hosp-Admission (Discharged) from 03/22/2021 in [place] 2C  CV PROGRESSIVE CARE  C-SSRS RISK CATEGORY No Risk No Risk     Review of Systems:  Review of Systems  Musculoskeletal:  Negative for gait problem.  Neurological:  Negative for tremors.  Psychiatric/Behavioral:         Please refer to HPI    Medications: I have reviewed the patient's current medications.  Current Outpatient Medications  Medication Sig Dispense Refill   acetaminophen  (TYLENOL ) 325 MG tablet Take 2 tablets (650 mg total) by mouth every 6 (six) hours as needed for mild pain or headache. 30 tablet    amoxicillin  (AMOXIL ) 500 MG tablet Take 4 tablets (2,000 mg total) by mouth as needed. 4 tablet 3   buPROPion  (WELLBUTRIN  XL) 150 MG 24 hr tablet Take 1 tablet by mouth daily. 90 tablet 0   buPROPion  (WELLBUTRIN  XL) 300 MG 24 hr tablet Take 1 tablet by mouth daily. 90 tablet 0   camphor-menthol  (SARNA) lotion Apply 1 application topically as needed for itching. (Patient not taking: Reported on 03/10/2024) 222 mL 0   cariprazine  (VRAYLAR ) 1.5 MG capsule Take 1 capsule (1.5 mg total) by mouth daily. 30 capsule 2   docusate sodium  (COLACE) 100 MG capsule Take 200 mg by mouth daily.     Ferrous Sulfate  Dried (HIGH POTENCY IRON) 65 MG TABS Take 1 tablet by mouth daily.     folic acid  (FOLVITE ) 1 MG tablet Take 1 tablet (1 mg total) by mouth daily.     lamoTRIgine  (LAMICTAL ) 150 MG tablet TAKE 1 TABLET BY MOUTH AT BEDTIME 30 tablet 0   levonorgestrel (MIRENA) 20 MCG/DAY IUD 1 each by Intrauterine route once.     MELATONIN PO Take 1 tablet by  mouth at bedtime.     metoprolol  succinate (TOPROL -XL) 25 MG 24 hr tablet Take 2 tablets (50 mg total) by mouth every morning AND 1 tablet (25 mg total) every evening. 270 tablet 3   oxybutynin  (DITROPAN -XL) 5 MG 24 hr tablet Take 5 mg by mouth every evening.     potassium chloride  (KLOR-CON ) 10 MEQ tablet Take 10 mEq by mouth 3 (three) times daily.     pravastatin  (PRAVACHOL ) 40 MG tablet Take 1 tablet (40 mg total) by mouth daily at 6 PM. 30  tablet 0   senna-docusate (SENOKOT-S) 8.6-50 MG tablet Take 1 tablet by mouth 2 (two) times daily. (Patient not taking: Reported on 03/10/2024)     torsemide  (DEMADEX ) 20 MG tablet Take 1 tablet (20 mg total) by mouth daily. Please take torsemide  20 mg twice daily if weight greater than 210lbs. 90 tablet 3   traZODone  (DESYREL ) 50 MG tablet Take 1 to 2 tablets by mouth at bedtime for sleep. 180 tablet 0   vitamin B-12 1000 MCG tablet Take 1 tablet (1,000 mcg total) by mouth daily.     warfarin (COUMADIN ) 1 MG tablet TAKE 1 TO 2 TABLETS BY MOUTH ONCE DAILY AS DIRECTED BY  COUMADIN   CLINIC 45 tablet 0   warfarin (COUMADIN ) 1 MG tablet TAKE 1 TO 2 (ONE TO TWO) TABLETS BY MOUTH ONCE DAILY AS  DIRECTED  BY  COUMADIN   CLINIC 145 tablet 1   No current facility-administered medications for this visit.    Medication Side Effects: None  Allergies:  Allergies  Allergen Reactions   Vancomycin  Itching    Patient can receive vancomycin  with slower infusion and prn benadryl  for itching  Patient can receive vancomycin  with slower infusion and prn benadryl  for itching     Patient can receive vancomycin  with slower infusion and prn benadryl  for itching    Past Medical History:  Diagnosis Date   Anemia 06/03/2019   Aortic stenosis 03/23/2013   Overview:  02/18/13 TTE EF >55%. Critical AS with mean Ao valve gradient of 82 mm Hg. No AI. No MR, PR, mild TR. Estimated RVSP 30 mm Hg.   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 Postmenopausal bleeding since for the past 2 years.  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 Last biopsy was in May of 2019 which showed weakly proliferative endometrium possibly a polyp.     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    Past Medical  History, Surgical history, Social history, and Family history were reviewed and updated as appropriate.   Please see review of systems for further details on the patient's review from today.   Objective:   Physical Exam:  There were no vitals taken for this visit.  Physical Exam Constitutional:      General: She is not in acute distress. Musculoskeletal:        General: No deformity.  Neurological:     Mental Status: She is alert and oriented to person, place, and time.     Coordination: Coordination normal.  Psychiatric:        Attention and Perception: Attention and perception normal. She does not perceive auditory or visual hallucinations.        Mood and Affect: Mood normal. Mood is not anxious or depressed. Affect is not labile, blunt, angry or inappropriate.        Speech: Speech normal.        Behavior: Behavior normal.        Thought Content: Thought content normal. Thought content is not paranoid or delusional. Thought content does not include homicidal or suicidal ideation. Thought content does not include homicidal or suicidal plan.        Cognition and Memory:  Cognition and memory normal.        Judgment: Judgment normal.     Comments: Insight intact     Lab Review:     Component Value Date/Time   NA 141 03/10/2024 1400   K 4.0 03/10/2024 1400   CL 100 03/10/2024 1400   CO2 22 03/10/2024 1400   GLUCOSE 97 03/10/2024 1400   GLUCOSE 88 03/31/2021 0042   BUN 15 03/10/2024 1400   CREATININE 1.38 (H) 03/10/2024 1400   CALCIUM 9.8 03/10/2024 1400   PROT 7.0 03/10/2024 1400   ALBUMIN 4.3 03/10/2024 1400   AST 14 03/10/2024 1400   ALT 13 03/10/2024 1400   ALKPHOS 57 03/10/2024 1400   BILITOT 0.4 03/10/2024 1400   GFRNONAA >60 03/31/2021 0042   GFRAA 74 06/06/2020 0903       Component Value Date/Time   WBC 10.5 03/10/2024 1400   WBC 13.4 (H) 04/02/2021 0008   RBC 4.96 03/10/2024 1400   RBC 5.45 (A) 08/07/2021 0000   HGB 15.1 03/10/2024 1400   HCT 46.9 (H) 03/10/2024 1400   PLT 186 03/10/2024 1400   MCV 95 03/10/2024 1400   MCV 88 08/07/2021 0000   MCH 30.4 03/10/2024 1400   MCH 27.9 04/02/2021 0008   MCHC 32.2 03/10/2024 1400   MCHC 30.4 04/02/2021 0008   RDW 13.1 03/10/2024 1400   LYMPHSABS 2.0 03/10/2024 1400   MONOABS 0.9 03/31/2021 0042   EOSABS 0.3 03/10/2024 1400   BASOSABS 0.1 03/10/2024 1400    No results found for: POCLITH, LITHIUM   No results found for: PHENYTOIN, PHENOBARB, VALPROATE, CBMZ   .res Assessment: Plan:    Continue: Increase Vraylar  1.5mg  to 3mg  daily for mood symptoms Trazadone 50mg  - 1 to 2 at hs Wellbutrin  XL 300mg  every morning - denies seizure history Wellbutrin  XL 150mg  every morning Lamictal  150mg  daily  25 minutes spent dedicated to the care of this patient on the date of this encounter to include pre-visit review of records, ordering of medication, post visit documentation, and face-to-face time with the patient discussing Bipolar disorder. Will increase Vraylar  1.5 to 3mg  daily for mood symptoms. Discussed continuing current medication regimen.  Counseled patient  regarding potential benefits, risks, and side effects of  Lamictal  to include potential risk of Stevens-Johnson syndrome. Advised patient to stop taking Lamictal  and contact office immediately if rash develops and to seek urgent medical attention if rash is severe and/or spreading quickly.   RTC 4 weeks  Patient advised to contact office with any questions, adverse effects, or acute worsening in signs and symptoms.  Discussed potential metabolic side effects associated with atypical antipsychotics, as well as potential risk for movement side effects. Advised pt to contact office if movement side effects occur.    Discussed potential benefits, risks, and side effects of Depakote  and need for period lab monitoring to assess for potential adverse effects to include obtaining CBC and LFTs.    There are no diagnoses linked to this encounter.   Please see After Visit Summary for patient specific instructions.  Future Appointments  Date Time Provider Department Center  07/29/2024  1:00 PM Hennessey Cantrell Nattalie, NP CP-CP None  08/24/2024  1:45 PM CVD-ASH COUMADIN  CVD-ASHE None  10/26/2024  4:30 PM MCA-CT 1 MCA-CT [place]    No orders of the defined types were placed in this encounter.   -------------------------------

## 2024-07-31 ENCOUNTER — Other Ambulatory Visit: Payer: Self-pay | Admitting: Adult Health

## 2024-07-31 DIAGNOSIS — F319 Bipolar disorder, unspecified: Secondary | ICD-10-CM

## 2024-08-24 ENCOUNTER — Ambulatory Visit: Attending: Cardiology

## 2024-08-24 DIAGNOSIS — Z7901 Long term (current) use of anticoagulants: Secondary | ICD-10-CM | POA: Diagnosis present

## 2024-08-24 DIAGNOSIS — Z954 Presence of other heart-valve replacement: Secondary | ICD-10-CM

## 2024-08-24 DIAGNOSIS — G459 Transient cerebral ischemic attack, unspecified: Secondary | ICD-10-CM | POA: Diagnosis present

## 2024-08-24 LAB — POCT INR: INR: 3.2 — AB (ref 2.0–3.0)

## 2024-08-24 NOTE — Patient Instructions (Signed)
 Continue 1 tablet daily EXCEPT 2 tablets on Mondays and Fridays.  Eat greens tonight. Stay consistent with greens each week  Recheck INR in 4 weeks.  Coumadin  Clinic [phone]

## 2024-08-25 ENCOUNTER — Other Ambulatory Visit: Payer: Self-pay | Admitting: Adult Health

## 2024-08-25 DIAGNOSIS — F319 Bipolar disorder, unspecified: Secondary | ICD-10-CM

## 2024-08-30 ENCOUNTER — Telehealth: Admitting: Adult Health

## 2024-09-06 ENCOUNTER — Encounter: Payer: Self-pay | Admitting: Adult Health

## 2024-09-06 ENCOUNTER — Telehealth: Admitting: Adult Health

## 2024-09-06 DIAGNOSIS — F319 Bipolar disorder, unspecified: Secondary | ICD-10-CM

## 2024-09-06 NOTE — Addendum Note (Signed)
 Addended by: LAWERANCE ANGELINE SAILOR on: 09/06/2024 05:19 PM   Modules accepted: Level of Service

## 2024-09-06 NOTE — Progress Notes (Signed)
 [name] [id number] Aug 26, 1956 68 y.o.  Virtual Visit via Video Note  I connected with pt @ on 09/06/2024 at  5:00 PM EST by a video enabled telemedicine application and verified that I am speaking with the correct person using two identifiers.   I discussed the limitations of evaluation and management by telemedicine and the availability of in person appointments. The patient expressed understanding and agreed to proceed.  I discussed the assessment and treatment plan with the patient. The patient was provided an opportunity to ask questions and all were answered. The patient agreed with the plan and demonstrated an understanding of the instructions.   The patient was advised to call back or seek an in-person evaluation if the symptoms worsen or if the condition fails to improve as anticipated.  I provided 25 minutes of non-face-to-face time during this encounter.  The patient was located at home.  The provider was located at [facility].   Martha LOISE Sayers, NP   Subjective:   Patient ID:  [name] is a 68 y.o. (DOB September 05, 1956) female.  Chief Complaint: No chief complaint on file.   HPI [name] presents for follow-up of Bipolar Disorder.  Accompanied by husband.  Describes mood today as ok. Pleasant. Denies tearfulness. Mood symptoms - reports some anxiety. Reports decreased depression - sometimes - 3 out of 10. Reports varying interest and motivation. Denies irritability. Reports getting out more - did more over the holidays. Reports doing more around the house. Reports completing more ADL's. Denies panic attacks. Reports some worry, over thinking and rumination - it depends. Denies mania. Reports mood as stable. Stating I feel like I'm doing a little bit better. Reports taking medications as prescribed.  Energy levels have improved. Active, does not have a regular exercise routine with physical limitations.  Enjoys usual interests and activities -  spending time with family. Married. Lives with husband.  Has a son and a daughter. Spending time with family. Appetite adequate. Weight gain over the holidays. Reports sleep is consistent. Averages 4 to 5 hours night. Reports some daytime napping. Focus and concentration improved - I think it's fine. Has been doing puzzles and math problems. Completing tasks. Managing more aspects of household. Retired. Denies SI or HI.  Denies AH or VH. Denies self harm. Denies substance use. Denies paranoia.  Previous medication trials: Pristiq, Wellbutrin , Cymbalta , other medications  Review of Systems:  Review of Systems  Musculoskeletal:  Negative for gait problem.  Neurological:  Negative for tremors.  Psychiatric/Behavioral:         Please refer to HPI    Medications: I have reviewed the patient's current medications.  Current Outpatient Medications  Medication Sig Dispense Refill   amoxicillin  (AMOXIL ) 500 MG tablet Take 4 tablets (2,000 mg total) by mouth as needed. 4 tablet 3   buPROPion  (WELLBUTRIN  XL) 150 MG 24 hr tablet Take 1 tablet by mouth daily. 90 tablet 0   buPROPion  (WELLBUTRIN  XL) 300 MG 24 hr tablet Take 1 tablet by mouth daily. 90 tablet 0   cariprazine  (VRAYLAR ) 3 MG capsule Take 1 capsule (3 mg total) by mouth daily. 30 capsule 2   docusate sodium  (COLACE) 100 MG capsule Take 200 mg by mouth daily.     Ferrous Sulfate  Dried (HIGH POTENCY IRON) 65 MG TABS Take 1 tablet by mouth daily.     folic acid  (FOLVITE ) 1 MG tablet Take 1 tablet (1 mg total) by mouth daily.     lamoTRIgine  (LAMICTAL )  150 MG tablet Take 1 tablet (150 mg total) by mouth at bedtime. 90 tablet 1   levonorgestrel (MIRENA) 20 MCG/DAY IUD 1 each by Intrauterine route once.     MELATONIN PO Take 1 tablet by mouth at bedtime.     metoprolol  succinate (TOPROL -XL) 25 MG 24 hr tablet Take 2 tablets (50 mg total) by mouth every morning AND 1 tablet (25 mg total) every evening. 270 tablet 3   oxybutynin   (DITROPAN -XL) 5 MG 24 hr tablet Take 5 mg by mouth every evening.     potassium chloride  (KLOR-CON ) 10 MEQ tablet Take 10 mEq by mouth 3 (three) times daily.     pravastatin  (PRAVACHOL ) 40 MG tablet Take 1 tablet (40 mg total) by mouth daily at 6 PM. 30 tablet 0   torsemide  (DEMADEX ) 20 MG tablet Take 1 tablet (20 mg total) by mouth daily. Please take torsemide  20 mg twice daily if weight greater than 210lbs. 90 tablet 3   traZODone  (DESYREL ) 50 MG tablet Take 1 to 2 tablets by mouth at bedtime for sleep. 180 tablet 0   vitamin B-12 1000 MCG tablet Take 1 tablet (1,000 mcg total) by mouth daily.     warfarin (COUMADIN ) 1 MG tablet TAKE 1 TO 2 TABLETS BY MOUTH ONCE DAILY AS DIRECTED BY  COUMADIN   CLINIC 45 tablet 0   warfarin (COUMADIN ) 1 MG tablet TAKE 1 TO 2 (ONE TO TWO) TABLETS BY MOUTH ONCE DAILY AS  DIRECTED  BY  COUMADIN   CLINIC 145 tablet 1   No current facility-administered medications for this visit.    Medication Side Effects: None  Allergies: Allergies[1]  Past Medical History:  Diagnosis Date   Anemia 06/03/2019   Aortic stenosis 03/23/2013   Overview:  02/18/13 TTE EF >55%. Critical AS with mean Ao valve gradient of 82 mm Hg. No AI. No MR, PR, mild TR. Estimated RVSP 30 mm Hg.   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 Postmenopausal bleeding since for the past 2 years.  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 Last biopsy was in May of 2019 which showed weakly proliferative endometrium possibly a polyp.     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    Family History  Problem Relation Age of Onset   Parkinson's disease Father    Heart attack Mother     Social History   Socioeconomic History   Marital status: Married    Spouse name: Not on  file   Number of children: Not on file   Years of education: Not on file   Highest education level: Not on file  Occupational History   Not on file  Tobacco Use   Smoking status: Never   Smokeless tobacco: Never  Vaping Use   Vaping status: Never Used  Substance and Sexual Activity   Alcohol use: No   Drug use: No   Sexual activity: Not Currently  Other Topics Concern   Not on file  Social History Narrative   Not on file   Social Drivers of Health   Tobacco Use: Low Risk (07/29/2024)   Patient History    Smoking Tobacco Use: Never    Smokeless Tobacco Use: Never    Passive Exposure: Not on file  Financial Resource Strain: Low Risk (09/04/2022)   Received from [facility]   Overall Financial Resource Strain (CARDIA)    Difficulty of Paying Living Expenses: Not hard at all  Food Insecurity: Not on file  Transportation Needs: No Transportation Needs (09/06/2022)   Received from [facility] - Transportation    Lack of Transportation (Non-Medical): No    Lack of Transportation (Medical): No  Physical Activity: Not on file  Stress: No Stress  Concern Present (09/04/2022)   Received from [facility] Occupational [facility] - Occupational Stress Questionnaire    Feeling of Stress : Only a little  Social Connections: Not on file  Intimate Partner Violence: Not on file  Depression (EYV7-0): Not on file  Alcohol Screen: Not on file  Housing: Not on file  Utilities: Not on file  Health Literacy: Not on file    Past Medical History, Surgical history, Social history, and Family history were reviewed and updated as appropriate.   Please see review of systems for further details on the patient's review from today.   Objective:   Physical Exam:  There were no vitals taken for this visit.  Physical Exam Constitutional:      General: She is not in acute distress. Musculoskeletal:        General: No deformity.  Neurological:     Mental Status: She is alert and oriented to person, place, and time.     Coordination: Coordination normal.  Psychiatric:        Attention and Perception: Attention and perception normal. She does not perceive auditory or visual hallucinations.        Mood and Affect: Mood normal. Mood is not anxious or depressed. Affect is not labile, blunt, angry or inappropriate.        Speech: Speech normal.        Behavior: Behavior normal.        Thought Content: Thought content normal. Thought content is not paranoid or delusional. Thought content does not include homicidal or suicidal ideation. Thought content does not include homicidal or suicidal plan.        Cognition and Memory: Cognition and memory normal.        Judgment: Judgment normal.     Comments: Insight intact     Lab Review:     Component Value Date/Time   NA 141 03/10/2024 1400   K 4.0 03/10/2024 1400   CL 100 03/10/2024 1400   CO2 22 03/10/2024 1400   GLUCOSE 97 03/10/2024 1400   GLUCOSE 88 03/31/2021 0042   BUN 15 03/10/2024 1400   CREATININE 1.38 (H) 03/10/2024 1400   CALCIUM 9.8 03/10/2024 1400   PROT 7.0  03/10/2024 1400   ALBUMIN 4.3 03/10/2024 1400   AST 14 03/10/2024 1400   ALT 13 03/10/2024 1400   ALKPHOS 57 03/10/2024 1400   BILITOT 0.4 03/10/2024 1400   GFRNONAA >60 03/31/2021 0042   GFRAA 74 06/06/2020 0903       Component Value Date/Time   WBC 10.5 03/10/2024 1400   WBC 13.4 (H) 04/02/2021 0008   RBC 4.96 03/10/2024 1400   RBC 5.45 (A) 08/07/2021 0000   HGB 15.1 03/10/2024 1400   HCT 46.9 (H) 03/10/2024 1400   PLT 186 03/10/2024 1400   MCV 95 03/10/2024 1400   MCV 88 08/07/2021 0000   MCH 30.4 03/10/2024 1400   MCH 27.9 04/02/2021 0008   MCHC 32.2 03/10/2024 1400   MCHC 30.4 04/02/2021 0008   RDW 13.1 03/10/2024 1400   LYMPHSABS 2.0 03/10/2024 1400   MONOABS 0.9 03/31/2021 0042   EOSABS 0.3 03/10/2024 1400   BASOSABS 0.1 03/10/2024 1400    No results found for: POCLITH, LITHIUM   No results found for: PHENYTOIN, PHENOBARB, VALPROATE, CBMZ   .res Assessment: Plan:    Plan:  Continue:  Vraylar  3mg  daily for mood symptoms Trazadone 50mg  - 1 to 2 at hs Wellbutrin  XL 300mg  every morning - denies seizure history Wellbutrin  XL 150mg  every morning Lamictal  150mg  daily  25 minutes spent dedicated to the care of this patient on the date of this encounter to include pre-visit review of records, ordering of medication, post visit documentation, and face-to-face time with the patient discussing Bipolar disorder. Discussed continuing current medication regimen.  Counseled patient regarding potential benefits, risks, and side effects of Lamictal  to include potential risk of Stevens-Johnson syndrome. Advised patient to stop taking Lamictal  and contact office immediately if rash develops and to seek urgent medical attention if rash is severe and/or spreading quickly.   RTC 4 weeks  Patient advised to contact office with any questions, adverse effects, or acute worsening in signs and symptoms.  Discussed potential metabolic side effects associated with  atypical antipsychotics, as well as potential risk for movement side effects. Advised pt to contact office if movement side effects occur.    Discussed potential benefits, risks, and side effects of Depakote  and need for period lab monitoring to assess for potential adverse effects to include obtaining CBC and LFTs.  There are no diagnoses linked to this encounter.   Please see After Visit Summary for patient specific instructions.  Future Appointments  Date Time Provider Department Center  09/06/2024  5:00 PM Ivie Savitt, Martha Mattocks, NP CP-CP None  09/21/2024  2:00 PM CVD-ASH COUMADIN  CVD-ASHE None  10/26/2024  4:30 PM MCA-CT 1 MCA-CT [place]    No orders of the defined types were placed in this encounter.     -------------------------------      [1]  Allergies Allergen Reactions   Vancomycin  Itching    Patient can receive vancomycin  with slower infusion and prn benadryl  for itching  Patient can receive vancomycin  with slower infusion and prn benadryl  for itching     Patient can receive vancomycin  with slower infusion and prn benadryl  for itching

## 2024-09-21 ENCOUNTER — Ambulatory Visit: Attending: Cardiology

## 2024-09-21 DIAGNOSIS — Z7901 Long term (current) use of anticoagulants: Secondary | ICD-10-CM | POA: Insufficient documentation

## 2024-09-21 DIAGNOSIS — Z954 Presence of other heart-valve replacement: Secondary | ICD-10-CM | POA: Insufficient documentation

## 2024-09-21 DIAGNOSIS — G459 Transient cerebral ischemic attack, unspecified: Secondary | ICD-10-CM | POA: Insufficient documentation

## 2024-09-21 LAB — POCT INR: INR: 3.3 — AB (ref 2.0–3.0)

## 2024-09-21 NOTE — Patient Instructions (Signed)
 Take 0.5 tablet today only then Continue 1 tablet daily EXCEPT 2 tablets on Mondays and Fridays.  Stay consistent with greens each week  Recheck INR in 4 weeks.  Coumadin  Clinic [phone]

## 2024-10-01 ENCOUNTER — Other Ambulatory Visit: Payer: Self-pay | Admitting: Adult Health

## 2024-10-01 DIAGNOSIS — F319 Bipolar disorder, unspecified: Secondary | ICD-10-CM

## 2024-10-04 ENCOUNTER — Other Ambulatory Visit: Payer: Self-pay | Admitting: Adult Health

## 2024-10-04 DIAGNOSIS — F319 Bipolar disorder, unspecified: Secondary | ICD-10-CM

## 2024-10-07 ENCOUNTER — Telehealth: Admitting: Adult Health

## 2024-10-07 ENCOUNTER — Encounter: Payer: Self-pay | Admitting: Adult Health

## 2024-10-07 DIAGNOSIS — F319 Bipolar disorder, unspecified: Secondary | ICD-10-CM | POA: Diagnosis not present

## 2024-10-07 MED ORDER — LAMOTRIGINE 200 MG PO TABS: 200.0000 mg | ORAL_TABLET | Freq: Every day | ORAL | 2 refills | Status: AC

## 2024-10-07 MED ORDER — BUSPIRONE HCL 5 MG PO TABS: 5.0000 mg | ORAL_TABLET | Freq: Three times a day (TID) | ORAL | 2 refills | Status: AC

## 2024-10-07 NOTE — Telephone Encounter (Signed)
 Has appt today

## 2024-10-07 NOTE — Progress Notes (Signed)
 [name] [id number] [birth year] 69 y.o.  Virtual Visit via Video Note  I connected with pt @ on 10/07/24 at  1:30 PM EST by a video enabled telemedicine application and verified that I am speaking with the correct person using two identifiers.   I discussed the limitations of evaluation and management by telemedicine and the availability of in person appointments. The patient expressed understanding and agreed to proceed.  I discussed the assessment and treatment plan with the patient. The patient was provided an opportunity to ask questions and all were answered. The patient agreed with the plan and demonstrated an understanding of the instructions.   The patient was advised to call back or seek an in-person evaluation if the symptoms worsen or if the condition fails to improve as anticipated.  I provided 25 minutes of non-face-to-face time during this encounter.  The patient was located at home.  The provider was located at [facility].   Angeline LOISE Sayers, NP   Subjective:   Patient ID:  [name] is a 69 y.o. (DOB 05-29-1956) female.  Chief Complaint: No chief complaint on file.   HPI [name] presents for follow-up of Bipolar Disorder.  Accompanied by husband.  Describes mood today as ok. Pleasant. Denies tearfulness. Mood symptoms - reports some anxiety - it just depends on what day it is and what's going on. Reports decreased depression - a little bit. Reports lower interest and motivation. Denies irritability. Reports getting out some - less with the weather. Denies panic attacks. Reports some worry. Denies over thinking and rumination. Reports doing some things around the house - laundry. Reports completing more ADL's. Denies mania. Reports mood as stable. Stating I feel like I'm doing about the same as last month. Reports taking medications as prescribed.  Energy levels have improved. Active, does not have a regular exercise routine with  physical limitations.  Enjoys usual interests and activities - spending time with family. Married. Lives with husband. Has a son and a daughter. Spending time with family. Appetite adequate. Reports weight fluctuates - 3 to 4 pounds.  Reports sleep is consistent. Averages at least 5 hours a night. Reports some daytime napping. Focus and concentration improved - I think it's about the same. Completing tasks. Managing more aspects of household. Retired. Denies SI or HI. Denies AH or VH. Denies self harm. Denies substance use. Denies paranoia.  Previous medication trials: Pristiq, Wellbutrin , Cymbalta , other medications  Review of Systems:  Review of Systems  Musculoskeletal:  Negative for gait problem.  Neurological:  Negative for tremors.  Psychiatric/Behavioral:         Please refer to HPI    Medications: I have reviewed the patient's current medications.  Current Outpatient Medications  Medication Sig Dispense Refill   amoxicillin  (AMOXIL ) 500 MG tablet Take 4 tablets (2,000 mg total) by mouth as needed. 4 tablet 3   buPROPion  (WELLBUTRIN  XL) 150 MG 24 hr tablet Take 1 tablet by mouth daily. 90 tablet 0   buPROPion  (WELLBUTRIN  XL) 300 MG 24 hr tablet Take 1 tablet by mouth daily. 90 tablet 0   cariprazine  (VRAYLAR ) 3 MG capsule Take 1 capsule (3 mg total) by mouth daily. 30 capsule 2   docusate sodium  (COLACE) 100 MG capsule Take 200 mg by mouth daily.     Ferrous Sulfate  Dried (HIGH POTENCY IRON) 65 MG TABS Take 1 tablet by mouth daily.     folic acid  (FOLVITE ) 1 MG tablet Take 1 tablet (1 mg  total) by mouth daily.     lamoTRIgine  (LAMICTAL ) 150 MG tablet Take 1 tablet (150 mg total) by mouth at bedtime. 90 tablet 1   levonorgestrel (MIRENA) 20 MCG/DAY IUD 1 each by Intrauterine route once.     MELATONIN PO Take 1 tablet by mouth at bedtime.     metoprolol  succinate (TOPROL -XL) 25 MG 24 hr tablet Take 2 tablets (50 mg total) by mouth every morning AND 1 tablet (25 mg total)  every evening. 270 tablet 3   oxybutynin  (DITROPAN -XL) 5 MG 24 hr tablet Take 5 mg by mouth every evening.     potassium chloride  (KLOR-CON ) 10 MEQ tablet Take 10 mEq by mouth 3 (three) times daily.     pravastatin  (PRAVACHOL ) 40 MG tablet Take 1 tablet (40 mg total) by mouth daily at 6 PM. 30 tablet 0   torsemide  (DEMADEX ) 20 MG tablet Take 1 tablet (20 mg total) by mouth daily. Please take torsemide  20 mg twice daily if weight greater than 210lbs. 90 tablet 3   traZODone  (DESYREL ) 50 MG tablet Take 1 to 2 tablets by mouth at bedtime for sleep. 180 tablet 0   vitamin B-12 1000 MCG tablet Take 1 tablet (1,000 mcg total) by mouth daily.     warfarin (COUMADIN ) 1 MG tablet TAKE 1 TO 2 TABLETS BY MOUTH ONCE DAILY AS DIRECTED BY  COUMADIN   CLINIC 45 tablet 0   warfarin (COUMADIN ) 1 MG tablet TAKE 1 TO 2 (ONE TO TWO) TABLETS BY MOUTH ONCE DAILY AS  DIRECTED  BY  COUMADIN   CLINIC 145 tablet 1   No current facility-administered medications for this visit.    Medication Side Effects: None  Allergies: Allergies[1]  Past Medical History:  Diagnosis Date   Anemia 06/03/2019   Aortic stenosis 03/23/2013   Overview:  02/18/13 TTE EF >55%. Critical AS with mean Ao valve gradient of 82 mm Hg. No AI. No MR, PR, mild TR. Estimated RVSP 30 mm Hg.   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 Postmenopausal bleeding since for the past 2 years.  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 Last biopsy was in May of 2019 which showed weakly proliferative endometrium possibly a polyp.     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    Family History  Problem Relation Age of Onset   Parkinson's disease Father    Heart attack Mother     Social History   Socioeconomic History  Marital status: Married    Spouse name: Not on file   Number of children: Not on file   Years of education: Not on file   Highest education level: Not on file  Occupational History   Not on file  Tobacco Use   Smoking status: Never   Smokeless tobacco: Never  Vaping Use   Vaping status: Never Used  Substance and Sexual Activity   Alcohol use: No   Drug use: No   Sexual activity: Not Currently  Other Topics Concern   Not on file  Social History Narrative   Not on file   Social Drivers of Health   Tobacco Use: Low Risk (09/06/2024)   Patient History    Smoking Tobacco Use: Never    Smokeless Tobacco Use: Never    Passive Exposure: Not on file  Financial Resource Strain: Low Risk (09/04/2022)   Received from [facility]   Overall Financial Resource Strain (CARDIA)    Difficulty of Paying Living Expenses: Not hard at all  Food Insecurity: Not on file  Transportation Needs: No Transportation Needs (09/06/2022)   Received from [facility] - Transportation    Lack of Transportation (Non-Medical): No    Lack of Transportation (Medical): No  Physical  Activity: Not on file  Stress: No Stress Concern Present (09/04/2022)   Received from [facility] - Occupational Stress Questionnaire    Feeling of Stress : Only a little  Social Connections: Not on file  Intimate Partner Violence: Not on file  Depression (EYV7-0): Not on file  Alcohol Screen: Not on file  Housing: Not on file  Utilities: Not on file  Health Literacy: Not on file    Past Medical History, Surgical history, Social history, and Family history were reviewed and updated as appropriate.   Please see review of systems for further details on the patient's review from today.   Objective:   Physical Exam:  There were no vitals taken for this visit.  Physical Exam Constitutional:      General: She is not in acute distress. Musculoskeletal:        General: No deformity.  Neurological:     Mental Status: She is alert and oriented to person, place, and time.     Coordination: Coordination normal.  Psychiatric:        Attention and Perception: Attention and perception normal. She does not perceive auditory or visual hallucinations.        Mood and Affect: Mood normal. Mood is not anxious or depressed. Affect is not labile, blunt, angry or inappropriate.        Speech: Speech normal.        Behavior: Behavior normal.        Thought Content: Thought content normal. Thought content is not paranoid or delusional. Thought content does not include homicidal or suicidal ideation. Thought content does not include homicidal or suicidal plan.        Cognition and Memory: Cognition and memory normal.        Judgment: Judgment normal.     Comments: Insight intact     Lab Review:     Component Value Date/Time   NA 141 03/10/2024 1400   K 4.0 03/10/2024 1400   CL 100 03/10/2024 1400   CO2 22 03/10/2024 1400   GLUCOSE 97 03/10/2024 1400   GLUCOSE 88 03/31/2021 0042   BUN 15 03/10/2024 1400   CREATININE 1.38 (H) 03/10/2024 1400  CALCIUM 9.8 03/10/2024 1400   PROT 7.0 03/10/2024 1400   ALBUMIN 4.3 03/10/2024 1400   AST 14 03/10/2024 1400   ALT 13 03/10/2024 1400   ALKPHOS 57 03/10/2024 1400   BILITOT 0.4 03/10/2024 1400   GFRNONAA >60 03/31/2021 0042   GFRAA 74 06/06/2020 0903       Component Value Date/Time   WBC 10.5 03/10/2024 1400   WBC 13.4 (H) 04/02/2021 0008   RBC 4.96 03/10/2024 1400   RBC 5.45 (A) 08/07/2021 0000   HGB 15.1 03/10/2024 1400   HCT 46.9 (H) 03/10/2024 1400   PLT 186 03/10/2024 1400   MCV 95 03/10/2024 1400   MCV 88 08/07/2021 0000   MCH 30.4 03/10/2024 1400   MCH 27.9 04/02/2021 0008   MCHC 32.2 03/10/2024 1400   MCHC 30.4 04/02/2021 0008   RDW 13.1 03/10/2024 1400   LYMPHSABS 2.0 03/10/2024 1400   MONOABS 0.9 03/31/2021 0042   EOSABS 0.3 03/10/2024 1400   BASOSABS 0.1 03/10/2024 1400    No results found for: POCLITH, LITHIUM   No results found for: PHENYTOIN, PHENOBARB, VALPROATE, CBMZ   .res Assessment: Plan:    Plan:  Continue: Vraylar  3mg  daily for mood symptoms Trazadone 50mg  - 1 to 2 at hs Wellbutrin  XL 300mg  every morning - denies seizure history Wellbutrin  XL 150mg  every morning  Change: Increase Lamictal  150mg  to 200mg  daily  Add: Buspar  5mg  TID for anxiety  25 minutes spent dedicated to the care of this patient on the date of this encounter to include pre-visit review of records, ordering of medication, post visit documentation, and face-to-face time with the patient discussing Bipolar disorder. Discussed continuing current medication regimen.  Counseled patient regarding potential benefits, risks, and side effects of Lamictal  to include potential risk of Stevens-Johnson syndrome. Advised patient to stop taking Lamictal  and contact office immediately if rash develops and to seek urgent medical attention if rash is severe and/or spreading quickly.   RTC 4 weeks  Patient advised to contact office with any questions, adverse effects, or  acute worsening in signs and symptoms.  Discussed potential metabolic side effects associated with atypical antipsychotics, as well as potential risk for movement side effects. Advised pt to contact office if movement side effects occur.    Discussed potential benefits, risks, and side effects of Depakote  and need for period lab monitoring to assess for potential adverse effects to include obtaining CBC and LFTs.   There are no diagnoses linked to this encounter.   Please see After Visit Summary for patient specific instructions.  Future Appointments  Date Time Provider Department Center  10/07/2024  1:30 PM Arcenio Mullaly Nattalie, NP [facility] None  10/19/2024  2:15 PM [facility]  [facility] None  10/26/2024  4:30 PM [facility] 1 [facility] [place]    No orders of the defined types were placed in this encounter.     -------------------------------     [1]  Allergies Allergen Reactions   Vancomycin  Itching    Patient can receive vancomycin  with slower infusion and prn benadryl  for itching  Patient can receive vancomycin  with slower infusion and prn benadryl  for itching     Patient can receive vancomycin  with slower infusion and prn benadryl  for itching

## 2024-10-10 ENCOUNTER — Other Ambulatory Visit: Payer: Self-pay | Admitting: Cardiology

## 2024-10-10 DIAGNOSIS — Z954 Presence of other heart-valve replacement: Secondary | ICD-10-CM

## 2024-10-10 DIAGNOSIS — Z5181 Encounter for therapeutic drug level monitoring: Secondary | ICD-10-CM

## 2024-10-11 NOTE — Telephone Encounter (Signed)
 Warfarin 1mg  Dx-H/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  Last INR Check-09/21/24 Last OV- 03/10/24

## 2024-10-19 ENCOUNTER — Ambulatory Visit

## 2024-10-26 ENCOUNTER — Other Ambulatory Visit (HOSPITAL_BASED_OUTPATIENT_CLINIC_OR_DEPARTMENT_OTHER): Admitting: Radiology

## 2024-11-11 ENCOUNTER — Telehealth: Admitting: Adult Health
